# Patient Record
Sex: Female | Born: 1945 | Race: White | Hispanic: No | Marital: Single | Smoking: Former smoker
Health system: Southern US, Community
[De-identification: ages and names within clinical notes are randomized; demographics above are authoritative.]

## PROBLEM LIST (undated history)

## (undated) ENCOUNTER — Encounter

## (undated) ENCOUNTER — Encounter: Payer: MEDICARE | Attending: Ambulatory Care | Primary: Ambulatory Care

## (undated) ENCOUNTER — Ambulatory Visit

## (undated) ENCOUNTER — Encounter: Attending: Rheumatology | Primary: Rheumatology

## (undated) ENCOUNTER — Encounter: Attending: Family | Primary: Family

## (undated) ENCOUNTER — Ambulatory Visit: Payer: MEDICARE

## (undated) ENCOUNTER — Encounter: Payer: MEDICARE | Attending: Pulmonary Disease | Primary: Pulmonary Disease

## (undated) ENCOUNTER — Telehealth

## (undated) ENCOUNTER — Encounter: Payer: MEDICARE | Attending: Rheumatology | Primary: Rheumatology

## (undated) ENCOUNTER — Telehealth: Attending: Ambulatory Care | Primary: Ambulatory Care

## (undated) DIAGNOSIS — K635 Polyp of colon: Secondary | ICD-10-CM

## (undated) DIAGNOSIS — R079 Chest pain, unspecified: Secondary | ICD-10-CM

## (undated) DIAGNOSIS — J449 Chronic obstructive pulmonary disease, unspecified: Secondary | ICD-10-CM

## (undated) DIAGNOSIS — K604 Rectal fistula, unspecified: Secondary | ICD-10-CM

## (undated) DIAGNOSIS — M069 Rheumatoid arthritis, unspecified: Secondary | ICD-10-CM

## (undated) DIAGNOSIS — K219 Gastro-esophageal reflux disease without esophagitis: Secondary | ICD-10-CM

## (undated) DIAGNOSIS — E559 Vitamin D deficiency, unspecified: Secondary | ICD-10-CM

## (undated) DIAGNOSIS — K2270 Barrett's esophagus without dysplasia: Secondary | ICD-10-CM

## (undated) DIAGNOSIS — F418 Other specified anxiety disorders: Secondary | ICD-10-CM

## (undated) DIAGNOSIS — I4891 Unspecified atrial fibrillation: Secondary | ICD-10-CM

## (undated) DIAGNOSIS — R32 Unspecified urinary incontinence: Secondary | ICD-10-CM

## (undated) DIAGNOSIS — Z8669 Personal history of other diseases of the nervous system and sense organs: Secondary | ICD-10-CM

## (undated) DIAGNOSIS — I4821 Permanent atrial fibrillation: Secondary | ICD-10-CM

## (undated) DIAGNOSIS — G629 Polyneuropathy, unspecified: Secondary | ICD-10-CM

## (undated) HISTORY — DX: Barrett's esophagus without dysplasia: K22.70

## (undated) HISTORY — DX: Unspecified urinary incontinence: R32

## (undated) HISTORY — DX: Personal history of other diseases of the nervous system and sense organs: Z86.69

## (undated) HISTORY — DX: Other specified anxiety disorders: F41.8

## (undated) HISTORY — DX: Unspecified atrial fibrillation: I48.91

## (undated) HISTORY — DX: Gastro-esophageal reflux disease without esophagitis: K21.9

## (undated) HISTORY — DX: Rectal fistula: K60.4

## (undated) HISTORY — PX: APPENDECTOMY: SHX54

## (undated) HISTORY — DX: Chest pain, unspecified: R07.9

## (undated) HISTORY — DX: Polyp of colon: K63.5

## (undated) HISTORY — DX: Vitamin D deficiency, unspecified: E55.9

## (undated) HISTORY — DX: Polyneuropathy, unspecified: G62.9

## (undated) HISTORY — DX: Rectal fistula, unspecified: K60.40

## (undated) HISTORY — DX: Rheumatoid arthritis, unspecified: M06.9

## (undated) HISTORY — PX: OOPHORECTOMY: SHX86

## (undated) HISTORY — DX: Chronic obstructive pulmonary disease, unspecified: J44.9

---
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
## 1985-12-30 HISTORY — PX: CHOLECYSTECTOMY: SHX55

## 1990-12-30 HISTORY — PX: OTHER SURGICAL HISTORY: SHX169

## 1990-12-30 HISTORY — PX: ABDOMINAL HYSTERECTOMY: SHX81

## 2005-11-20 ENCOUNTER — Emergency Department: Payer: Self-pay | Admitting: Internal Medicine

## 2005-11-20 ENCOUNTER — Other Ambulatory Visit: Payer: Self-pay

## 2005-11-23 ENCOUNTER — Inpatient Hospital Stay: Payer: Self-pay | Admitting: Internal Medicine

## 2005-11-23 ENCOUNTER — Other Ambulatory Visit: Payer: Self-pay

## 2005-11-25 ENCOUNTER — Other Ambulatory Visit: Payer: Self-pay

## 2005-11-26 ENCOUNTER — Other Ambulatory Visit: Payer: Self-pay

## 2006-01-10 ENCOUNTER — Emergency Department: Payer: Self-pay | Admitting: Emergency Medicine

## 2006-01-10 ENCOUNTER — Other Ambulatory Visit: Payer: Self-pay

## 2006-03-12 ENCOUNTER — Emergency Department: Payer: Self-pay | Admitting: Unknown Physician Specialty

## 2006-03-13 ENCOUNTER — Inpatient Hospital Stay: Payer: Self-pay | Admitting: Internal Medicine

## 2006-03-13 ENCOUNTER — Other Ambulatory Visit: Payer: Self-pay

## 2007-01-05 ENCOUNTER — Emergency Department: Payer: Self-pay

## 2007-02-14 ENCOUNTER — Other Ambulatory Visit: Payer: Self-pay

## 2007-02-14 ENCOUNTER — Emergency Department: Payer: Self-pay | Admitting: Emergency Medicine

## 2007-06-28 ENCOUNTER — Emergency Department: Payer: Self-pay | Admitting: Emergency Medicine

## 2007-08-18 ENCOUNTER — Emergency Department: Payer: Self-pay | Admitting: Emergency Medicine

## 2007-10-14 ENCOUNTER — Ambulatory Visit: Payer: Self-pay | Admitting: Gastroenterology

## 2007-12-04 ENCOUNTER — Other Ambulatory Visit: Payer: Self-pay

## 2007-12-04 ENCOUNTER — Inpatient Hospital Stay: Payer: Self-pay | Admitting: Internal Medicine

## 2008-02-17 ENCOUNTER — Emergency Department: Payer: Self-pay | Admitting: Internal Medicine

## 2008-02-21 ENCOUNTER — Other Ambulatory Visit: Payer: Self-pay

## 2008-02-21 ENCOUNTER — Emergency Department: Payer: Self-pay | Admitting: Internal Medicine

## 2008-04-30 ENCOUNTER — Emergency Department: Payer: Self-pay | Admitting: Emergency Medicine

## 2008-04-30 ENCOUNTER — Other Ambulatory Visit: Payer: Self-pay

## 2008-08-01 ENCOUNTER — Emergency Department: Payer: Self-pay

## 2008-08-23 ENCOUNTER — Emergency Department: Payer: Self-pay | Admitting: Emergency Medicine

## 2009-02-07 ENCOUNTER — Ambulatory Visit: Payer: Self-pay | Admitting: Internal Medicine

## 2009-08-15 ENCOUNTER — Encounter: Payer: Self-pay | Admitting: General Practice

## 2010-09-10 ENCOUNTER — Emergency Department: Payer: Self-pay | Admitting: Emergency Medicine

## 2010-09-20 ENCOUNTER — Emergency Department: Payer: Self-pay | Admitting: Emergency Medicine

## 2011-01-20 ENCOUNTER — Emergency Department: Payer: Self-pay | Admitting: Unknown Physician Specialty

## 2011-11-16 ENCOUNTER — Inpatient Hospital Stay: Payer: Self-pay | Admitting: Psychiatry

## 2011-12-16 DIAGNOSIS — R7303 Prediabetes: Secondary | ICD-10-CM | POA: Insufficient documentation

## 2011-12-29 ENCOUNTER — Emergency Department: Payer: Self-pay | Admitting: *Deleted

## 2011-12-31 HISTORY — PX: CARDIAC ELECTROPHYSIOLOGY STUDY AND ABLATION: SHX1294

## 2012-04-16 ENCOUNTER — Ambulatory Visit: Payer: Self-pay | Admitting: Family Medicine

## 2012-04-28 ENCOUNTER — Ambulatory Visit: Payer: Self-pay | Admitting: Family Medicine

## 2012-11-23 DIAGNOSIS — Z800 Family history of malignant neoplasm of digestive organs: Secondary | ICD-10-CM | POA: Insufficient documentation

## 2012-11-23 DIAGNOSIS — D126 Benign neoplasm of colon, unspecified: Secondary | ICD-10-CM | POA: Insufficient documentation

## 2012-12-14 DIAGNOSIS — L821 Other seborrheic keratosis: Secondary | ICD-10-CM | POA: Insufficient documentation

## 2013-05-31 DIAGNOSIS — M069 Rheumatoid arthritis, unspecified: Secondary | ICD-10-CM | POA: Insufficient documentation

## 2013-08-18 ENCOUNTER — Encounter: Payer: Self-pay | Admitting: Adult Health

## 2013-08-18 ENCOUNTER — Ambulatory Visit (INDEPENDENT_AMBULATORY_CARE_PROVIDER_SITE_OTHER): Payer: Medicare Other | Admitting: Adult Health

## 2013-08-18 VITALS — BP 116/70 | HR 74 | Temp 98.4°F | Resp 12 | Ht 66.0 in | Wt 214.0 lb

## 2013-08-18 DIAGNOSIS — K603 Anal fistula: Secondary | ICD-10-CM

## 2013-08-18 DIAGNOSIS — K529 Noninfective gastroenteritis and colitis, unspecified: Secondary | ICD-10-CM | POA: Insufficient documentation

## 2013-08-18 DIAGNOSIS — R0602 Shortness of breath: Secondary | ICD-10-CM

## 2013-08-18 DIAGNOSIS — R5381 Other malaise: Secondary | ICD-10-CM

## 2013-08-18 DIAGNOSIS — R209 Unspecified disturbances of skin sensation: Secondary | ICD-10-CM

## 2013-08-18 DIAGNOSIS — R197 Diarrhea, unspecified: Secondary | ICD-10-CM

## 2013-08-18 DIAGNOSIS — R531 Weakness: Secondary | ICD-10-CM | POA: Insufficient documentation

## 2013-08-18 DIAGNOSIS — Z0000 Encounter for general adult medical examination without abnormal findings: Secondary | ICD-10-CM

## 2013-08-18 DIAGNOSIS — K604 Rectal fistula: Secondary | ICD-10-CM

## 2013-08-18 DIAGNOSIS — R5383 Other fatigue: Secondary | ICD-10-CM

## 2013-08-18 DIAGNOSIS — K2270 Barrett's esophagus without dysplasia: Secondary | ICD-10-CM

## 2013-08-18 DIAGNOSIS — Z1239 Encounter for other screening for malignant neoplasm of breast: Secondary | ICD-10-CM

## 2013-08-18 DIAGNOSIS — R200 Anesthesia of skin: Secondary | ICD-10-CM | POA: Insufficient documentation

## 2013-08-18 LAB — POCT URINALYSIS DIPSTICK
Bilirubin, UA: NEGATIVE
Blood, UA: NEGATIVE
Glucose, UA: NEGATIVE
Nitrite, UA: NEGATIVE
Spec Grav, UA: 1.02
Urobilinogen, UA: 1
pH, UA: 6

## 2013-08-18 NOTE — Assessment & Plan Note (Addendum)
Occuring after heavy meals. Unknown etiology. She is taking [place]. Refer to GI

## 2013-08-18 NOTE — Patient Instructions (Addendum)
   Thank you for choosing [place] [facility] for your health care needs.  Please have your labs drawn prior to leaving the office.  The results will be available through MyChart for your convenience. Please remember to activate this. The activation code is located at the end of this form.  I am referring you to GI and Cardiology as discussed.  Remember to call to schedule your Mammography appointment.  Return for follow up in 3 months.

## 2013-08-18 NOTE — Assessment & Plan Note (Signed)
Physical exam normal except problems listed separately. Check labs: CBC with differential, bmet, TSH, lipids, hepatic panel. I am referring her to Dr. Smith Mince (GI) for her Barrett's esophagus, chronic diarrhea and rectal fistula. I am referring her to Dr. Kirke Corin for her history of A. fib and recent complaints of shortness of breath with exertion. She would like to establish care with a local cardiologist. Patient has appointment with her rheumatologist, Dr. Wallace Cullens, on September 15. I am having her return for followup in 3 months or sooner if needed.

## 2013-08-18 NOTE — Assessment & Plan Note (Signed)
Neuropathy. Check B12, metabolic panel, tsh. Consider nerve conduction test

## 2013-08-18 NOTE — Assessment & Plan Note (Addendum)
Refer to GI 

## 2013-08-18 NOTE — Assessment & Plan Note (Signed)
Recent EGD. Request medical records from Dr. Pandora Leiter in [place]. Refer to local GI.

## 2013-08-18 NOTE — Progress Notes (Signed)
Subjective:    Patient ID: Brandy Armstrong, female    DOB: [birth year], 67 y.o.   MRN: [id number]  HPI  Patient is a pleasant 67 year old female who presents to clinic to establish care. Patient has been under the care of multiple specialist (GI, Rheumatology, Cardiology, Psychiatry) but has not had a PCP. She reports going to urgent care or to the [facility] occasionally if experiencing problems. She became eligible for Medicare on August 1 and decided it was time to get a primary provider. Her greatest concern at this time is that she has gained a considerable amount of weight; however, she reports that she has not been active. She also reports sleeping a lot. Another concern is that she is becoming short of breath with exertion. She attributes this to poor endurance from lack of activity. No shortness of breath at rest. Patient has been experiencing chronic diarrhea after heavy meals. She has been prescribed questran. She was to have a follow up visit with her GI doctor in [place] but did not keep appointment. She is trying to find local physicians.   Past Medical History  Diagnosis Date  . Rheumatoid arthritis     On methotrexate and orencia  . GERD (gastroesophageal reflux disease)   . Atrial fibrillation     s/p ablation 01/2012 in [facility] by Dr. Dory Peru  . Urinary incontinence   . Colon polyps   . Depression with anxiety   . Barretts esophagus   . Rectal fistula   . Vitamin D deficiency     Past Surgical History  Procedure Laterality Date  . Cholecystectomy  1987  . Abdominal hysterectomy  1992  . Oophrectomy Bilateral 1992  . Cardiac electrophysiology study and ablation  2013    Family History  Problem Relation Age of Onset  . Depression Mother   . Cancer Mother 9    pancreatic cancer  . Cancer Father 77    colon cancer  . Cancer Sister 30    breast cancer  . Diabetes Brother     History   Social History  . Marital Status: Single    Spouse Name: N/A   Number of Children: 0  . Years of Education: 16   Occupational History  . Not on file.   Social History Main Topics  . Smoking status: Former Smoker -- 1.00 packs/day for 40 years    Quit date: 12/16/2011  . Smokeless tobacco: Never Used  . Alcohol Use: Yes     Comment: Occasionally has a drink  . Drug Use: No  . Sexual Activity: Yes    Birth Control/ Protection: Surgical   Other Topics Concern  . Not on file   Social History Narrative   Brandy Armstrong was born and reared in [place]. She attended [employer] and graduated in 1971 with her Bachelors in Education. 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 She is single. She is currently retired. She loves reading. She loves to spend time with her dog.     Review of Systems  Constitutional: Positive for fatigue.  Respiratory: Positive for shortness of breath.        Sob with exertion. Patient believes it is from deconditioning.  Cardiovascular: Positive for leg swelling.  Gastrointestinal: Positive for diarrhea.       Diarrhea after meals. She is followed by MD in [facility]. Rectal fistula.  Endocrine: Negative.   Genitourinary: Positive for dysuria, urgency and frequency. Negative for hematuria and vaginal bleeding.       Nocturnal incontinence. Reports odor of urine.  Musculoskeletal: Positive for back pain, joint swelling and arthralgias.       Hx of rheumatoid arthritis.  Neurological: Positive for tremors and light-headedness.       Pins and needles in feet.  Psychiatric/Behavioral: Negative for suicidal ideas, behavioral problems, confusion, sleep disturbance, self-injury and agitation. The patient is nervous/anxious.     BP 116/70  Pulse 74  Temp(Src) 98.4 F (36.9 C) (Oral)  Resp 12  Ht 5\' 6"  (1.676 m)  Wt 214 lb (97.07 kg)  BMI 34.56 kg/m2  SpO2 97%    Objective:   Physical Exam  Constitutional: She is  oriented to person, place, and time.  Overweight, pleasant female in no apparent distress  HENT:  Head: Normocephalic and atraumatic.  Right Ear: External ear normal.  Left Ear: External ear normal.  Nose: Nose normal.  Mouth/Throat: Oropharynx is clear and moist.  Eyes: Conjunctivae and EOM are normal. Pupils are equal, round, and reactive to light.  Wears glasses  Neck: Normal range of motion. Neck supple. No tracheal deviation present. No thyromegaly present.  Cardiovascular: Normal rate, regular rhythm, normal heart sounds and intact distal pulses.  Exam reveals no gallop and no friction rub.   No murmur heard. Pulmonary/Chest: Effort normal and breath sounds normal. No respiratory distress. She has no wheezes. She has no rales. She exhibits no tenderness.  Abdominal: Soft. Bowel sounds are normal. She exhibits no mass. There is no tenderness. There is no rebound and no guarding.  Large, rounded, soft abdomen  Musculoskeletal: She exhibits edema and tenderness.  Painful joints especially bilateral knees. Some pain in lower back. Patient has appointment with her rheumatologist September 15  Lymphadenopathy:    She has no cervical adenopathy.  Neurological: She is alert and oriented to person, place, and time. She has normal reflexes. No cranial nerve deficit. Coordination normal.  Skin: Skin is warm and dry.  Psychiatric: She has a normal mood and affect. Her behavior is normal. Judgment and thought content normal.      Assessment & Plan:

## 2013-08-18 NOTE — Assessment & Plan Note (Signed)
Patient has been sleeping more than usual. Chronic problems with diarrhea - ?electrolyte abnormality vs. Dehydration. Will check bmet and also check tsh and cbc.

## 2013-08-18 NOTE — Assessment & Plan Note (Signed)
Screening mammography at [facility] imaging.

## 2013-08-18 NOTE — Assessment & Plan Note (Signed)
Patient with history of A. fib status post ablation. Patient has been extremely sedentary and symptoms may be attributed to decreased stamina. Check CBC, bmet, tsh. Refer to cardiology. She has seen Dr. Kirke Corin in the past.

## 2013-08-19 ENCOUNTER — Other Ambulatory Visit: Payer: Self-pay | Admitting: Adult Health

## 2013-08-19 DIAGNOSIS — D649 Anemia, unspecified: Secondary | ICD-10-CM

## 2013-08-19 LAB — CBC WITH DIFFERENTIAL/PLATELET
Basophils Absolute: 0.2 10*3/uL — ABNORMAL HIGH (ref 0.0–0.1)
Eosinophils Absolute: 0 10*3/uL (ref 0.0–0.7)
HCT: 29.6 % — ABNORMAL LOW (ref 36.0–46.0)
Hemoglobin: 9.5 g/dL — ABNORMAL LOW (ref 12.0–15.0)
Lymphs Abs: 1.8 10*3/uL (ref 0.7–4.0)
MCHC: 32 g/dL (ref 30.0–36.0)
MCV: 74.9 fl — ABNORMAL LOW (ref 78.0–100.0)
Monocytes Absolute: 0.2 10*3/uL (ref 0.1–1.0)
Neutro Abs: 5.6 10*3/uL (ref 1.4–7.7)
Platelets: 243 10*3/uL (ref 150.0–400.0)
RDW: 17.3 % — ABNORMAL HIGH (ref 11.5–14.6)

## 2013-08-19 LAB — BASIC METABOLIC PANEL
BUN: 14 mg/dL (ref 6–23)
CO2: 26 mEq/L (ref 19–32)
Calcium: 9 mg/dL (ref 8.4–10.5)
GFR: 83.71 mL/min (ref >60.00)
Glucose, Bld: 104 mg/dL — ABNORMAL HIGH (ref 70–99)
Sodium: 133 mEq/L — ABNORMAL LOW (ref 135–145)

## 2013-08-19 LAB — HEPATIC FUNCTION PANEL
ALT: 20 U/L (ref 0–35)
AST: 43 U/L — ABNORMAL HIGH (ref 0–37)
Bilirubin, Direct: 0.1 mg/dL (ref 0.0–0.3)
Total Bilirubin: 0.4 mg/dL (ref 0.3–1.2)

## 2013-08-19 LAB — TSH: TSH: 0.87 u[IU]/mL (ref 0.35–5.50)

## 2013-08-19 LAB — VITAMIN B12: Vitamin B-12: 281 pg/mL (ref 211–911)

## 2013-08-19 LAB — LIPID PANEL: Cholesterol: 160 mg/dL (ref 0–200)

## 2013-08-20 ENCOUNTER — Other Ambulatory Visit (INDEPENDENT_AMBULATORY_CARE_PROVIDER_SITE_OTHER): Payer: Medicare Other

## 2013-08-20 DIAGNOSIS — D649 Anemia, unspecified: Secondary | ICD-10-CM

## 2013-08-20 LAB — CBC WITH DIFFERENTIAL/PLATELET
Basophils Relative: 0.6 % (ref 0.0–3.0)
Eosinophils Relative: 2.9 % (ref 0.0–5.0)
HCT: 29.7 % — ABNORMAL LOW (ref 36.0–46.0)
Hemoglobin: 9.5 g/dL — ABNORMAL LOW (ref 12.0–15.0)
MCHC: 32.1 g/dL (ref 30.0–36.0)
MCV: 75.2 fl — ABNORMAL LOW (ref 78.0–100.0)
Monocytes Absolute: 0.3 10*3/uL (ref 0.1–1.0)
Neutro Abs: 3.6 10*3/uL (ref 1.4–7.7)
Neutrophils Relative %: 54.7 % (ref 43.0–77.0)
RBC: 3.95 Mil/uL (ref 3.87–5.11)
WBC: 6.6 10*3/uL (ref 4.5–10.5)

## 2013-08-20 LAB — FERRITIN: Ferritin: 12.1 ng/mL (ref 10.0–291.0)

## 2013-08-24 ENCOUNTER — Other Ambulatory Visit (INDEPENDENT_AMBULATORY_CARE_PROVIDER_SITE_OTHER): Payer: Medicare Other

## 2013-08-24 DIAGNOSIS — D649 Anemia, unspecified: Secondary | ICD-10-CM

## 2013-08-31 ENCOUNTER — Emergency Department: Payer: Self-pay | Admitting: Emergency Medicine

## 2013-08-31 ENCOUNTER — Telehealth: Payer: Self-pay | Admitting: Adult Health

## 2013-08-31 LAB — BASIC METABOLIC PANEL
BUN: 9 mg/dL (ref 7–18)
Creatinine: 0.84 mg/dL (ref 0.60–1.30)
EGFR (African American): >60
EGFR (Non-African Amer.): >60
Glucose: 110 mg/dL — ABNORMAL HIGH (ref 65–99)
Osmolality: 273 (ref 275–301)

## 2013-08-31 LAB — CBC WITH DIFFERENTIAL/PLATELET
Eosinophil #: 0.2 10*3/uL (ref 0.0–0.7)
Eosinophil %: 1.8 %
HGB: 10.5 g/dL — ABNORMAL LOW (ref 12.0–16.0)
MCH: 24.3 pg — ABNORMAL LOW (ref 26.0–34.0)
Monocyte #: 0.6 x10 3/mm (ref 0.2–0.9)
Monocyte %: 6.8 %
Neutrophil %: 59.3 %
Platelet: 213 10*3/uL (ref 150–440)
RDW: 18.7 % — ABNORMAL HIGH (ref 11.5–14.5)
WBC: 9.3 10*3/uL (ref 3.6–11.0)

## 2013-08-31 NOTE — Telephone Encounter (Signed)
FYI Left message on answering machine to confirm that pt went to ED for evaluation and to call with update tomorrow.

## 2013-08-31 NOTE — Telephone Encounter (Signed)
Patient Information:  Caller Name: Sherryn  Phone: [phone]  Patient: Brandy Armstrong, Brandy Armstrong  Gender: Female  DOB: 1946/01/26  Age: 67 Years  PCP: Orville Govern  Office Follow Up:  Does the office need to follow up with this patient?: No  Instructions For The Office: N/A   Symptoms  Reason For Call & Symptoms: Pt's left ankle is having stabbing pain in her left ankle with walking.  SX started 09/01/2013.  Her hemaglobin is low  and she is scheduled for an endoscopy 09/02/13  She took  some old Prednisone 5 mg  x 2 tabs.  She wants more Prednisone called in and instructed an appt needs to be made.  Pain is on the medial side of left ankle with walking.  Reviewed Health History In EMR: Yes  Reviewed Medications In EMR: Yes  Reviewed Allergies In EMR: Yes  Reviewed Surgeries / Procedures: N/A  Date of Onset of Symptoms: Unknown  Treatments Tried: Predniosone 5 mg x 2 (took 2 old pills)  Treatments Tried Worked: No  Guideline(s) Used:  Ankle Pain  Disposition Per Guideline:   Go to Office Now  No appts left so pt will go to the ED, [facility]  Reason For Disposition Reached:   Severe pain (e.g., excruciating, unable to walk) and not improved after 2 hours of pain medicine  Advice Given:  Be very careful walking.   Patient Will Follow Care Advice:  YES

## 2013-09-02 ENCOUNTER — Telehealth: Payer: Self-pay | Admitting: Adult Health

## 2013-09-02 ENCOUNTER — Ambulatory Visit: Payer: Self-pay | Admitting: Gastroenterology

## 2013-09-02 NOTE — Telephone Encounter (Signed)
[phone] alternate cell# for pt.  Asking if Raquel could give her a call.  Pt would like to speak with Raquel regarding a recent endoscopy.  Pt not satisfied with Dr. Servando Snare and states she still has no answers to what her problem may be.  Pt would prefer to go to [facility] if possible.

## 2013-09-03 NOTE — Telephone Encounter (Signed)
Please call patient and find out information on what she needs. We can refer her to [facility] if she prefers.

## 2013-09-03 NOTE — Telephone Encounter (Signed)
Left message on machine for patient returning her call 

## 2013-09-03 NOTE — Telephone Encounter (Signed)
Spoke with patient and she has an appointment at [facility] 10/07/13.

## 2013-09-06 LAB — PATHOLOGY REPORT

## 2013-09-09 ENCOUNTER — Ambulatory Visit: Payer: Medicare Other | Admitting: Cardiovascular Disease

## 2013-09-11 DIAGNOSIS — M1990 Unspecified osteoarthritis, unspecified site: Secondary | ICD-10-CM | POA: Insufficient documentation

## 2013-09-11 DIAGNOSIS — IMO0002 Reserved for concepts with insufficient information to code with codable children: Secondary | ICD-10-CM | POA: Insufficient documentation

## 2013-09-13 DIAGNOSIS — Z23 Encounter for immunization: Secondary | ICD-10-CM | POA: Insufficient documentation

## 2013-09-16 ENCOUNTER — Ambulatory Visit: Payer: Medicare Other | Admitting: Cardiovascular Disease

## 2013-09-16 DIAGNOSIS — R42 Dizziness and giddiness: Secondary | ICD-10-CM | POA: Insufficient documentation

## 2013-09-17 ENCOUNTER — Telehealth: Payer: Self-pay | Admitting: Adult Health

## 2013-09-17 ENCOUNTER — Encounter: Payer: Self-pay | Admitting: Adult Health

## 2013-09-17 ENCOUNTER — Ambulatory Visit (INDEPENDENT_AMBULATORY_CARE_PROVIDER_SITE_OTHER): Payer: Medicare Other | Admitting: Adult Health

## 2013-09-17 VITALS — BP 108/66 | HR 81 | Temp 98.1°F | Resp 12 | Ht 66.0 in | Wt 213.5 lb

## 2013-09-17 DIAGNOSIS — M25572 Pain in left ankle and joints of left foot: Secondary | ICD-10-CM | POA: Insufficient documentation

## 2013-09-17 DIAGNOSIS — M25579 Pain in unspecified ankle and joints of unspecified foot: Secondary | ICD-10-CM

## 2013-09-17 NOTE — Telephone Encounter (Signed)
States she went to ER yesterday regarding L ankle pain.  States she has rheumatoid arthritis.  States at ER visit she was told it is degenerative arthritis.  States it is causing excruciating pain and will nearly make her fall when she takes a step.  Pt did not want to schedule appt.  Pt asking for a call.  May leave msg.

## 2013-09-17 NOTE — Patient Instructions (Addendum)
  Continue with the tramadol for your ankle pain.  Ice alternating with heat for 15 minutes at a time. Do this 3-4 times a day. You will do this for 1 week.  Rest and elevate your foot as much as possible.  Use comfortable walking shoes. You can try an ankle support to see if this helps.

## 2013-09-17 NOTE — Telephone Encounter (Signed)
The patient returned you call she was seen at [facility]. She did not want to be seen today she is on Monday's schedule with Raquel.

## 2013-09-17 NOTE — Assessment & Plan Note (Signed)
Continue with the tramadol for your ankle pain. Ice alternating with heat for 15 minutes at a time. Do this 3-4 times a day. You will do this for 1 week. Rest and elevate foot as much as possible. Use comfortable walking shoes. You can try an ankle support to see if this helps. If no improvement will refer to podiatry.

## 2013-09-17 NOTE — Progress Notes (Signed)
  Subjective:    Patient ID: Brandy Armstrong, female    DOB: Oct 13, 1946, 67 y.o.   MRN: [id number]  HPI  Patient is a 67 year old female who presents to clinic following an ED visit on 08/31/2013 for pain in the left ankle with ambulation. I have obtained the records from the emergency room visit. The x-ray shows no evidence of fracture, dislocation or foreign body. She does have a bony spur on the plantar region of her heel. Patient denies any swelling. She denies having any pain with palpation of the ankle. This pain started on the date of her emergency room visit. She also denies any injury, or rolling of her ankle. In the emergency room she she was given tramadol and she has taken this a few times. She has not been consistent with the medication.  Current Outpatient Prescriptions on File Prior to Visit  Medication Sig Dispense Refill  . cholestyramine (QUESTRAN) 4 G packet Take 1 packet by mouth as needed.      . clonazePAM (KLONOPIN) 0.5 MG tablet Take 0.5 mg by mouth 3 (three) times daily as needed.       . ergocalciferol (VITAMIN D2) 50000 UNITS capsule Take 50,000 Units by mouth once a week.      . escitalopram (LEXAPRO) 20 MG tablet Take 20 mg by mouth daily.       . folic acid (FOLVITE) 1 MG tablet Take 1 mg by mouth daily.      . Methotrexate, Anti-Rheumatic, (METHOTREXATE, PF, [place]) Inject into the skin. 50 mg/18mL  0.76mL subcutaneous injection weekly      . omeprazole (PRILOSEC) 40 MG capsule Take 40 mg by mouth daily.       [name] ORENCIA 125 MG/ML SOLN Inject 1 each into the skin once a week.       . predniSONE (DELTASONE) 5 MG tablet Take 10 mg by mouth daily.       . Rivaroxaban (XARELTO) 20 MG TABS tablet Take 20 mg by mouth daily with supper.      . sotalol (BETAPACE) 120 MG tablet Take 120 mg by mouth 2 (two) times daily.        No current facility-administered medications on file prior to visit.    Review of Systems  Musculoskeletal:       Pain in left ankle with ambulation.      BP 108/66  Pulse 81  Temp(Src) 98.1 F (36.7 C) (Oral)  Resp 12  Ht 5\' 6"  (1.676 m)  Wt 213 lb 8 oz (96.843 kg)  BMI 34.48 kg/m2  SpO2 98%     Objective:   Physical Exam  Constitutional: She is oriented to person, place, and time. No distress.  Musculoskeletal: Normal range of motion. She exhibits no edema and no tenderness.  Left medial malleolus without any point tenderness, swelling or any s/s injury. Excellent pulses.  Neurological: She is alert and oriented to person, place, and time.  Psychiatric: She has a normal mood and affect. Her behavior is normal. Judgment and thought content normal.          Assessment & Plan:

## 2013-09-17 NOTE — Telephone Encounter (Signed)
Requested records from [facility] 

## 2013-09-17 NOTE — Telephone Encounter (Signed)
Called and left message, advising pt to schedule an OV with Raquel today and to leave name of ED she went to so I may get records.

## 2013-09-20 ENCOUNTER — Ambulatory Visit: Payer: Medicare Other | Admitting: Adult Health

## 2013-09-30 ENCOUNTER — Ambulatory Visit: Payer: Medicare Other | Admitting: Cardiovascular Disease

## 2013-11-04 ENCOUNTER — Other Ambulatory Visit: Payer: Self-pay

## 2013-11-04 ENCOUNTER — Ambulatory Visit: Payer: Medicare Other | Admitting: Cardiovascular Disease

## 2013-11-09 DIAGNOSIS — M778 Other enthesopathies, not elsewhere classified: Secondary | ICD-10-CM | POA: Insufficient documentation

## 2013-11-09 DIAGNOSIS — M779 Enthesopathy, unspecified: Secondary | ICD-10-CM

## 2013-11-18 ENCOUNTER — Ambulatory Visit: Payer: Medicare Other | Admitting: Adult Health

## 2013-11-22 ENCOUNTER — Encounter: Payer: Self-pay | Admitting: *Deleted

## 2013-11-29 ENCOUNTER — Encounter: Payer: Self-pay | Admitting: *Deleted

## 2013-12-13 ENCOUNTER — Ambulatory Visit: Payer: Medicare Other | Admitting: Cardiovascular Disease

## 2014-01-04 ENCOUNTER — Telehealth: Payer: Self-pay | Admitting: Adult Health

## 2014-01-04 NOTE — Telephone Encounter (Signed)
Spoke with pt, states not loose stools, formed stools but having them frequently. Tolerating ginger ale and tea. No appetite. States symptoms started last night. Offered appointment right now to see Raquel, pt declines. Offered appointment tomorrow, pt took a 10:15 appointment tomorrow.

## 2014-01-04 NOTE — Telephone Encounter (Signed)
See other telephone note encounter.

## 2014-01-04 NOTE — Telephone Encounter (Signed)
Please advise 

## 2014-01-04 NOTE — Telephone Encounter (Signed)
Attempted to return call, but no answer; msg said line was having technical difficulties and was unable to leave a msg on VM

## 2014-01-04 NOTE — Telephone Encounter (Signed)
Ask if she can come now

## 2014-01-04 NOTE — Telephone Encounter (Signed)
Patient Information:  Caller Name: Brandy Armstrong  Phone: [phone]  Patient: Brandy Armstrong, Brandy Armstrong  Gender: Female  DOB: [birth year]  Age: 68 Years  PCP: Charolette Forward  Office Follow Up:  Does the office need to follow up with this patient?: Yes  Instructions For The Office: Has go to office now disposition due to age >23 and has had > 6 stools in past 24 hours   Symptoms  Reason For Call & Symptoms: [name] states she had onset of vomiting and diarrhea at 0200. Has vomited x 2 . Diarrhea too many to count. Intermittent abdominal cramping briefly relieved by diarrhea. Has felt feverish with chills on 01/04/13. Has not checked temp. Has taken Tylenol. Nauseated. Feels weak. Per diarrhea protocol has  go to office now due to age > 70 and more than 6 diarrhea stools. No appts left in [facility]. Declined appt at another [place] location. Please call Arieliz at [phone].  Reviewed Health History In EMR: Yes  Reviewed Medications In EMR: Yes  Reviewed Allergies In EMR: Yes  Reviewed Surgeries / Procedures: Yes  Date of Onset of Symptoms: 01/04/2014  Treatments Tried: Immodium x 2  Treatments Tried Worked: No  Guideline(s) Used:  Diarrhea  Vomiting  Disposition Per Guideline:   Go to Office Now  Reason For Disposition Reached:   Age > 60 years and has had > 6 diarrhea stools in past 24 hours  Advice Given:  N/A  Patient Will Follow Care Advice:  YES

## 2014-01-05 ENCOUNTER — Ambulatory Visit: Payer: Medicare Other | Admitting: Adult Health

## 2014-02-28 ENCOUNTER — Telehealth: Payer: Self-pay | Admitting: Adult Health

## 2014-02-28 ENCOUNTER — Emergency Department: Payer: Self-pay | Admitting: Emergency Medicine

## 2014-02-28 LAB — CBC
HCT: 39 % (ref 35.0–47.0)
HGB: 12.4 g/dL (ref 12.0–16.0)
MCH: 25 pg — ABNORMAL LOW (ref 26.0–34.0)
MCHC: 31.9 g/dL — AB (ref 32.0–36.0)
MCV: 78 fL — ABNORMAL LOW (ref 80–100)
Platelet: 209 10*3/uL (ref 150–440)
RBC: 4.98 10*6/uL (ref 3.80–5.20)
RDW: 16.3 % — ABNORMAL HIGH (ref 11.5–14.5)
WBC: 8.1 10*3/uL (ref 3.6–11.0)

## 2014-02-28 LAB — TROPONIN I
Troponin-I: <0.02 ng/mL
Troponin-I: <0.02 ng/mL

## 2014-02-28 LAB — LIPASE, BLOOD: Lipase: 219 U/L (ref 73–393)

## 2014-02-28 LAB — BASIC METABOLIC PANEL
Anion Gap: 7 (ref 7–16)
BUN: 14 mg/dL (ref 7–18)
CALCIUM: 8.7 mg/dL (ref 8.5–10.1)
CHLORIDE: 103 mmol/L (ref 98–107)
CO2: 24 mmol/L (ref 21–32)
Creatinine: 0.89 mg/dL (ref 0.60–1.30)
EGFR (African American): >60
EGFR (Non-African Amer.): >60
GLUCOSE: 126 mg/dL — AB (ref 65–99)
Osmolality: 270 (ref 275–301)
POTASSIUM: 4.1 mmol/L (ref 3.5–5.1)
SODIUM: 134 mmol/L — AB (ref 136–145)

## 2014-02-28 NOTE — Telephone Encounter (Signed)
FYI

## 2014-02-28 NOTE — Telephone Encounter (Signed)
Patient Information:  Caller Name: Sayre  Phone: [phone]  Patient: Brandy Armstrong, Brandy Armstrong  Gender: Female  DOB: 1946-04-16  Age: 67 Years  PCP: Charolette Forward  Office Follow Up:  Does the office need to follow up with this patient?: No  Instructions For The Office: N/A   Symptoms  Reason For Call & Symptoms: Pt had called the office for an appt. There are none today or tomorrow so she was put through to the nurse. Pt has pain in her left  shoulder that goes up her neck and across her shoulders at the back. There was no known injury. Pt has rheumatoid arthritis and takes Prednisone but states this does not feel like that pain. It started 3 days ago. Pt states it is a 9 out of a 10 pain/unbearable. She has also had some SOB.  Reviewed Health History In EMR: Yes  Reviewed Medications In EMR: Yes  Reviewed Allergies In EMR: Yes  Reviewed Surgeries / Procedures: Yes  Date of Onset of Symptoms: 02/25/2014  Guideline(s) Used:  Arm Pain  Disposition Per Guideline:   Go to ED Now/no appts at office /RN advised pt go to [facility].  Reason For Disposition Reached:   Age > 38 and no obvious cause for pain, pain still present even when not moving the arm  Advice Given:  N/A  Patient Will Follow Care Advice:  YES

## 2014-03-02 ENCOUNTER — Ambulatory Visit (INDEPENDENT_AMBULATORY_CARE_PROVIDER_SITE_OTHER): Payer: Medicare Other | Admitting: Adult Health

## 2014-03-02 ENCOUNTER — Encounter: Payer: Self-pay | Admitting: Adult Health

## 2014-03-02 VITALS — BP 99/69 | HR 82 | Temp 97.8°F | Resp 14 | Wt 211.0 lb

## 2014-03-02 DIAGNOSIS — M542 Cervicalgia: Secondary | ICD-10-CM

## 2014-03-02 MED ORDER — HYDROCODONE-ACETAMINOPHEN 5-325 MG PO TABS: 1.0000 | ORAL_TABLET | Freq: Four times a day (QID) | ORAL | Status: DC | PRN

## 2014-03-02 MED ORDER — CYCLOBENZAPRINE HCL 5 MG PO TABS: 5.0000 mg | ORAL_TABLET | Freq: Three times a day (TID) | ORAL | Status: DC | PRN

## 2014-03-02 NOTE — Progress Notes (Signed)
Pre visit review using our clinic review tool, if applicable. No additional management support is needed unless otherwise documented below in the visit note. 

## 2014-03-02 NOTE — Progress Notes (Signed)
   Subjective:    Patient ID: Brandy Armstrong, female    DOB: January 24, 1946, 68 y.o.   MRN: [id number]  Arm Pain  Pertinent negatives include no chest pain.  Neck Pain  Pertinent negatives include no chest pain, headaches or weakness.  Shoulder Pain     Pt is a 68 y/o female who presents to clinic with left side neck pain and posterior shoulder. She reports going to the ED with her symptoms on 02/28/14. She reports they evaluated her for her heart without any finding. She states that they gave her valium in the ED but did not seem to help her symptoms. Pain is keeping her up at night. Pain started approximate 1 week ago. No chest pain, no syncope or sob. No fever or neck stiffness. No nausea or vomiting. Denies injury to the area. No falls reported.   Current Outpatient Prescriptions on File Prior to Visit  Medication Sig Dispense Refill  . acetaminophen (TYLENOL) 500 MG tablet Take 500 mg by mouth every 6 (six) hours as needed for pain.      . cholestyramine (QUESTRAN) 4 G packet Take 1 packet by mouth as needed.      . clonazePAM (KLONOPIN) 0.5 MG tablet Take 0.5 mg by mouth 3 (three) times daily as needed.       . ergocalciferol (VITAMIN D2) 50000 UNITS capsule Take 50,000 Units by mouth once a week.      . escitalopram (LEXAPRO) 20 MG tablet Take 20 mg by mouth daily.       . ferrous sulfate 325 (65 FE) MG tablet Take 325 mg by mouth daily with breakfast.      . folic acid (FOLVITE) 1 MG tablet Take 1 mg by mouth daily.      [name] ORENCIA 125 MG/ML SOLN Inject 1 each into the skin once a week.       . predniSONE (DELTASONE) 5 MG tablet Take 10 mg by mouth daily.       . Rivaroxaban (XARELTO) 20 MG TABS tablet Take 20 mg by mouth daily with supper.      . sotalol (BETAPACE) 120 MG tablet Take 120 mg by mouth 2 (two) times daily.       . traMADol (ULTRAM) 50 MG tablet Take 50 mg by mouth every 6 (six) hours as needed for pain.       No current facility-administered medications on file prior to  visit.      Review of Systems  Constitutional: Negative.   Respiratory: Negative.   Cardiovascular: Negative for chest pain, palpitations and leg swelling.  Musculoskeletal: Positive for neck pain. Negative for neck stiffness.  Neurological: Negative for dizziness, syncope, weakness, light-headedness and headaches.  Psychiatric/Behavioral: Negative.   All other systems reviewed and are negative.       Objective:   Physical Exam  Neck: Normal range of motion. Neck supple. Muscular tenderness present. No spinous process tenderness present. No rigidity. No edema and no erythema present.  Musculoskeletal:       Left shoulder: Normal. She exhibits normal range of motion, no tenderness, no bony tenderness, no swelling, no effusion and no crepitus.  Skin:             Assessment & Plan:    1. Neck pain on left side Suspect muscular origin involving trapezius. Ice, good neck support, flexeril for spasms. Will give her short course of Norco for pain especially at bedtime. If no improvement will refer to ortho.

## 2014-03-11 ENCOUNTER — Inpatient Hospital Stay: Payer: Self-pay | Admitting: Internal Medicine

## 2014-03-11 ENCOUNTER — Encounter: Payer: Self-pay | Admitting: Cardiovascular Disease

## 2014-03-11 ENCOUNTER — Ambulatory Visit (INDEPENDENT_AMBULATORY_CARE_PROVIDER_SITE_OTHER): Payer: Medicare Other | Admitting: Cardiovascular Disease

## 2014-03-11 VITALS — BP 106/80 | HR 106 | Ht 66.5 in | Wt 209.8 lb

## 2014-03-11 DIAGNOSIS — R0602 Shortness of breath: Secondary | ICD-10-CM

## 2014-03-11 DIAGNOSIS — J449 Chronic obstructive pulmonary disease, unspecified: Secondary | ICD-10-CM | POA: Insufficient documentation

## 2014-03-11 DIAGNOSIS — I4891 Unspecified atrial fibrillation: Secondary | ICD-10-CM

## 2014-03-11 LAB — BASIC METABOLIC PANEL
ANION GAP: 8 (ref 7–16)
BUN: 13 mg/dL (ref 7–18)
CO2: 22 mmol/L (ref 21–32)
Calcium, Total: 8.5 mg/dL (ref 8.5–10.1)
Chloride: 106 mmol/L (ref 98–107)
Creatinine: 0.89 mg/dL (ref 0.60–1.30)
Glucose: 89 mg/dL (ref 65–99)
OSMOLALITY: 272 (ref 275–301)
Potassium: 4 mmol/L (ref 3.5–5.1)
Sodium: 136 mmol/L (ref 136–145)

## 2014-03-11 LAB — CBC
HCT: 39.7 % (ref 35.0–47.0)
HGB: 12.5 g/dL (ref 12.0–16.0)
MCH: 24.5 pg — ABNORMAL LOW (ref 26.0–34.0)
MCHC: 31.4 g/dL — ABNORMAL LOW (ref 32.0–36.0)
MCV: 78 fL — AB (ref 80–100)
Platelet: 195 10*3/uL (ref 150–440)
RBC: 5.09 10*6/uL (ref 3.80–5.20)
RDW: 16.6 % — AB (ref 11.5–14.5)
WBC: 11.3 10*3/uL — AB (ref 3.6–11.0)

## 2014-03-11 LAB — TSH: THYROID STIMULATING HORM: 1.99 u[IU]/mL

## 2014-03-11 LAB — TROPONIN I: Troponin-I: <0.02 ng/mL

## 2014-03-11 NOTE — Assessment & Plan Note (Signed)
She is in atrial fibrillation with rapid ventricular response with mild hypotension. Thus, it's difficult to control her rate with medications. I recommend an echocardiogram while hospitalized followed by elective cardioversion on Monday if she is still in atrial fibrillation. We also have to make sure there is no evidence of pulmonary vein stenosis given her previous atrial fibrillation ablation

## 2014-03-11 NOTE — Progress Notes (Signed)
Electrophysiologist: Dr. Isaias Sakai at [facility]  HPI  This is a 68 year old female who was added to my schedule today due to chest pain and shortness of breath. I have not seen her since 2011. I treated her at that time for persistent atrial fibrillation. She maintained in sinus rhythm mostly with Sotalol. After I left my previous practice, she established with [facility] cardiology. She informed me that she underwent catheter ablation in February of 2013 which was about a long and complex procedure and lasted 8 hours according to the patient. I spoke with her cardiologist today. She underwent stress testing about 2 years ago for dyspnea which was unremarkable. She was noted to be in sinus rhythm recently during an emergency room visit. Over the last 2 weeks she has experienced significant worsening of exertional dyspnea. This morning, she felt that she went into atrial fibrillation and became extremely out of breath with substernal chest discomfort, dizziness but no syncope. She is getting extremely dyspneic just walking in the hallway. Blood pressure is also low manually at 96/60.   Allergies  Allergen Reactions  . Codeine Nausea Only  . Latex      Current Outpatient Prescriptions on File Prior to Visit  Medication Sig Dispense Refill  . acetaminophen (TYLENOL) 500 MG tablet Take 500 mg by mouth every 6 (six) hours as needed for pain.      . cholestyramine (QUESTRAN) 4 G packet Take 1 packet by mouth as needed.      . clonazePAM (KLONOPIN) 0.5 MG tablet Take 0.5 mg by mouth 3 (three) times daily as needed.       . cyclobenzaprine (FLEXERIL) 5 MG tablet Take 1 tablet (5 mg total) by mouth 3 (three) times daily as needed for muscle spasms.  30 tablet  1  . escitalopram (LEXAPRO) 20 MG tablet Take 20 mg by mouth daily.       [name] esomeprazole (NEXIUM) 40 MG capsule Take 40 mg by mouth daily at 12 noon.      [name] ORENCIA 125 MG/ML SOLN Inject 1 each into the skin once a week.       . Rivaroxaban (XARELTO) 20  MG TABS tablet Take 20 mg by mouth daily with supper.      . sotalol (BETAPACE) 120 MG tablet Take 120 mg by mouth 2 (two) times daily.        No current facility-administered medications on file prior to visit.     Past Medical History  Diagnosis Date  . Rheumatoid arthritis     On methotrexate and orencia  . GERD (gastroesophageal reflux disease)   . Atrial fibrillation     s/p ablation 01/2012 in [facility] by Dr. Boyd Kerbs  . Urinary incontinence   . Colon polyps   . Depression with anxiety   . Barretts esophagus   . Rectal fistula   . Vitamin D deficiency   . A-fib      Past Surgical History  Procedure Laterality Date  . Cholecystectomy  1987  . Abdominal hysterectomy  1992  . Oophrectomy Bilateral 1992  . Cardiac electrophysiology study and ablation  2013     Family History  Problem Relation Age of Onset  . Depression Mother   . Cancer Mother 75    pancreatic cancer  . Cancer Father 68    colon cancer  . Cancer Sister 30    breast cancer  . Diabetes Brother      History   Social History  . Marital  Status: Single    Spouse Name: N/A    Number of Children: 0  . Years of Education: 16   Occupational History  . Not on file.   Social History Main Topics  . Smoking status: Former Smoker -- 1.00 packs/day for 40 years    Quit date: 12/16/2011  . Smokeless tobacco: Never Used  . Alcohol Use: Yes     Comment: Occasionally has a drink  . Drug Use: No  . Sexual Activity: Yes   Other Topics Concern  . Not on file   Social History Narrative   Ms. Due was born and reared in [place]. She attended [employer] and graduated in 1971 with her [place] in Education. 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 She then worked for a [employer] in [place]. She is single. She is currently retired. She loves reading. She loves to spend time with her dog.      ROS A 10 point review of system was  performed. It is negative other than that mentioned in the history of present illness.   PHYSICAL EXAM   BP 106/80  Pulse 106  Ht 5' 6.5" (1.689 m)  Wt 209 lb 12 oz (95.142 kg)  BMI 33.35 kg/m2 Constitutional: She is oriented to person, place, and time. She appears well-developed and well-nourished. She is in mild respiratory distress HENT: No nasal discharge.  Head: Normocephalic and atraumatic.  Eyes: Pupils are equal and round. No discharge.  Neck: Normal range of motion. Neck supple. No JVD present. No thyromegaly present.  Cardiovascular: Normal rate, irregular rhythm, normal heart sounds. Exam reveals no gallop and no friction rub. No murmur heard.  Pulmonary/Chest: Effort normal and breath sounds normal. No stridor. No respiratory distress. She has no wheezes. She has no rales. She exhibits no tenderness.  Abdominal: Soft. Bowel sounds are normal. She exhibits no distension. There is no tenderness. There is no rebound and no guarding.  Musculoskeletal: Normal range of motion. She exhibits no edema and no tenderness.  Neurological: She is alert and oriented to person, place, and time. Coordination normal.  Skin: Skin is warm and dry. No rash noted. She is not diaphoretic. No erythema. No pallor.  Psychiatric: She has a normal mood and affect. Her behavior is normal. Judgment and thought content normal.     EKG: Atrial fibrillation with ventricular rate of 106 beats per minute. Nonspecific T wave changes.   ASSESSMENT AND PLAN

## 2014-03-11 NOTE — Assessment & Plan Note (Signed)
The patient is having severe shortness of breath with activities and at rest. She seems to be in mild respiratory distress. She is in atrial fibrillation which might be contributing. However, ventricular rate is not significantly high. She is know to be very symptomatic when she goes into atrial fibrillation. However, I think it's important to exclude other etiologies. I recommend that presented to the emergency room for evaluation.  Recommend hospital admission, serial cardiac enzymes and CTA of the chest to evaluate for pulmonary embolism or other etiologies.

## 2014-03-11 NOTE — Patient Instructions (Signed)
Please go to the Emergency Department at [facility] right away.  The triage nurse has been notified.

## 2014-03-12 DIAGNOSIS — R0609 Other forms of dyspnea: Secondary | ICD-10-CM

## 2014-03-12 DIAGNOSIS — I4891 Unspecified atrial fibrillation: Secondary | ICD-10-CM

## 2014-03-12 DIAGNOSIS — I517 Cardiomegaly: Secondary | ICD-10-CM

## 2014-03-12 DIAGNOSIS — R0789 Other chest pain: Secondary | ICD-10-CM

## 2014-03-12 DIAGNOSIS — R0989 Other specified symptoms and signs involving the circulatory and respiratory systems: Secondary | ICD-10-CM

## 2014-03-12 LAB — BASIC METABOLIC PANEL
ANION GAP: 5 — AB (ref 7–16)
BUN: 14 mg/dL (ref 7–18)
Calcium, Total: 8.4 mg/dL — ABNORMAL LOW (ref 8.5–10.1)
Chloride: 107 mmol/L (ref 98–107)
Co2: 26 mmol/L (ref 21–32)
Creatinine: 0.83 mg/dL (ref 0.60–1.30)
EGFR (Non-African Amer.): >60
GLUCOSE: 80 mg/dL (ref 65–99)
OSMOLALITY: 275 (ref 275–301)
Potassium: 3.7 mmol/L (ref 3.5–5.1)
SODIUM: 138 mmol/L (ref 136–145)

## 2014-03-12 LAB — PRO B NATRIURETIC PEPTIDE: B-TYPE NATIURETIC PEPTID: 222 pg/mL — AB (ref 0–125)

## 2014-03-12 LAB — CBC WITH DIFFERENTIAL/PLATELET
BASOS ABS: 0.1 10*3/uL (ref 0.0–0.1)
BASOS PCT: 0.6 %
EOS PCT: 2.2 %
Eosinophil #: 0.2 10*3/uL (ref 0.0–0.7)
HCT: 35.7 % (ref 35.0–47.0)
HGB: 11 g/dL — ABNORMAL LOW (ref 12.0–16.0)
LYMPHS PCT: 40.5 %
Lymphocyte #: 3.7 10*3/uL — ABNORMAL HIGH (ref 1.0–3.6)
MCH: 24 pg — AB (ref 26.0–34.0)
MCHC: 30.9 g/dL — ABNORMAL LOW (ref 32.0–36.0)
MCV: 78 fL — ABNORMAL LOW (ref 80–100)
Monocyte #: 0.8 x10 3/mm (ref 0.2–0.9)
Monocyte %: 8.3 %
NEUTROS PCT: 48.4 %
Neutrophil #: 4.4 10*3/uL (ref 1.4–6.5)
Platelet: 170 10*3/uL (ref 150–440)
RBC: 4.59 10*6/uL (ref 3.80–5.20)
RDW: 16.4 % — ABNORMAL HIGH (ref 11.5–14.5)
WBC: 9.1 10*3/uL (ref 3.6–11.0)

## 2014-03-12 LAB — MAGNESIUM: Magnesium: 1.6 mg/dL — ABNORMAL LOW

## 2014-03-12 LAB — TROPONIN I: Troponin-I: <0.02 ng/mL

## 2014-03-13 LAB — BASIC METABOLIC PANEL
Anion Gap: 3 — ABNORMAL LOW (ref 7–16)
BUN: 10 mg/dL (ref 7–18)
CALCIUM: 9 mg/dL (ref 8.5–10.1)
Chloride: 105 mmol/L (ref 98–107)
Co2: 29 mmol/L (ref 21–32)
Creatinine: 0.82 mg/dL (ref 0.60–1.30)
EGFR (African American): >60
Glucose: 108 mg/dL — ABNORMAL HIGH (ref 65–99)
Osmolality: 273 (ref 275–301)
POTASSIUM: 4.1 mmol/L (ref 3.5–5.1)
Sodium: 137 mmol/L (ref 136–145)

## 2014-03-14 ENCOUNTER — Telehealth: Payer: Self-pay | Admitting: *Deleted

## 2014-03-14 ENCOUNTER — Ambulatory Visit: Payer: Medicare Other | Admitting: Cardiovascular Disease

## 2014-03-14 ENCOUNTER — Telehealth: Payer: Self-pay

## 2014-03-14 DIAGNOSIS — R0602 Shortness of breath: Secondary | ICD-10-CM

## 2014-03-14 NOTE — Telephone Encounter (Signed)
Scheduled stress test

## 2014-03-14 NOTE — Telephone Encounter (Signed)
Patient has financial assistance at [facility]. She would like to check about having it done with her doctor there first. She will call and let us know.

## 2014-03-14 NOTE — Telephone Encounter (Signed)
I reviewed her records. She went back into normal rhythm. If she is still having significant shortness of breath, then it is better to do a stress test. If she wants to have it done at [facility], that is fine too.

## 2014-03-14 NOTE — Telephone Encounter (Signed)
Scheduled patient for a Lexi Myoview.   Brandy Armstrong  Your caregiver has ordered a Stress Test with nuclear imaging. The purpose of this test is to evaluate the blood supply to your heart muscle. This procedure is referred to as a "Non-Invasive Stress Test." This is because other than having an IV started in your vein, nothing is inserted or "invades" your body. Cardiac stress tests are done to find areas of poor blood flow to the heart by determining the extent of coronary artery disease (CAD). Some patients exercise on a treadmill, which naturally increases the blood flow to your heart, while others who are  unable to walk on a treadmill due to physical limitations have a pharmacologic/chemical stress agent called Lexiscan . This medicine will mimic walking on a treadmill by temporarily increasing your coronary blood flow.   Please note: these test may take anywhere between 2-4 hours to complete  PLEASE REPORT TO [facility] AT THE FIRST DESK WILL DIRECT YOU WHERE TO GO  Date of Procedure:___________3/19/15__________________________  Arrival Time for Procedure:______09:45 am________________________  Instructions regarding medication:    PLEASE NOTIFY THE OFFICE AT LEAST 24 HOURS IN ADVANCE IF YOU ARE UNABLE TO KEEP YOUR APPOINTMENT.  [phone] AND  PLEASE NOTIFY NUCLEAR MEDICINE AT [facility] AT LEAST 24 HOURS IN ADVANCE IF YOU ARE UNABLE TO KEEP YOUR APPOINTMENT. [phone]  How to prepare for your Myoview test:  1. Do not eat or drink after midnight 2. No caffeine for 24 hours prior to test 3. No smoking 24 hours prior to test. 4. Your medication may be taken with water.  If your doctor stopped a medication because of this test, do not take that medication. 5. Ladies, please do not wear dresses.  Skirts or pants are appropriate. Please wear a short sleeve shirt. 6. No perfume, cologne or lotion. 7. Wear comfortable walking shoes. No heels!  Patient  verbalized understanding of instructions.

## 2014-03-14 NOTE — Telephone Encounter (Signed)
Pt states she was relased from the hospital yesterday, and states the Dr. Parks Ranger discharged he said for her to call and make a stress test appt. Pt states she is "runnig out of money, and doesn't know what to do". She states she is thinking about going to [place], would like to talk with a nurse and see if Dr. Fletcher Anon can review her notes from [place] this weekend and call her back.

## 2014-03-16 ENCOUNTER — Telehealth: Payer: Self-pay

## 2014-03-16 ENCOUNTER — Telehealth: Payer: Self-pay | Admitting: Adult Health

## 2014-03-16 ENCOUNTER — Encounter: Payer: Self-pay | Admitting: Cardiovascular Disease

## 2014-03-16 NOTE — Telephone Encounter (Signed)
Pt would like echo to be faxed to [facility] dr. [phone]. States the echo was performed at [facility], but wasn't complete. (?) Pt states she is not sure if Dr. Fletcher Anon has seen her echo or not. I did scan the echo from [place]. Please call.

## 2014-03-16 NOTE — Telephone Encounter (Signed)
Instructed patient to call [facility] records. Patient verbalized understanding.

## 2014-03-16 NOTE — Telephone Encounter (Signed)
Patient calling back in stating she doesn't want to sit on this referral. Please respond asap. She is willing to [facility].

## 2014-03-16 NOTE — Telephone Encounter (Signed)
Patient called in states she has seen a cardiologist. She would like to know if she could see a pulmonologist to have them check her breathing. She states she gives out of breath when she walks at times. She doesn't feel this issue is coming from her heart she feels it is coming from her breathing. Please advise.

## 2014-03-17 ENCOUNTER — Other Ambulatory Visit: Payer: Self-pay | Admitting: Adult Health

## 2014-03-17 ENCOUNTER — Ambulatory Visit: Payer: Self-pay | Admitting: Cardiovascular Disease

## 2014-03-17 DIAGNOSIS — R0602 Shortness of breath: Secondary | ICD-10-CM

## 2014-03-17 NOTE — Telephone Encounter (Signed)
Pt aware of apt with Dr. Lake Bells at the [facility] office. Brandy Armstrong also stated Brandy Armstrong went to [facility] and stayed from 3/13-3/15. I have req'd those records for your review. Brandy Armstrong also completed a stress test today.

## 2014-03-17 NOTE — Telephone Encounter (Signed)
lvm for pt to call our office.

## 2014-03-17 NOTE — Telephone Encounter (Signed)
Referral to [facility] made. Pt was recently seen by Fletcher Anon and he sent her to the hospital because she was in afib and symptomatic. Can you find out about that hospital visit?

## 2014-03-18 ENCOUNTER — Other Ambulatory Visit: Payer: Self-pay

## 2014-03-18 ENCOUNTER — Telehealth: Payer: Self-pay

## 2014-03-18 DIAGNOSIS — R0602 Shortness of breath: Secondary | ICD-10-CM

## 2014-03-18 NOTE — Telephone Encounter (Signed)
Reviewed results with patient. 

## 2014-03-18 NOTE — Telephone Encounter (Signed)
Pt would like stress test results.  

## 2014-03-18 NOTE — Telephone Encounter (Signed)
I have those records, [employer]

## 2014-03-22 ENCOUNTER — Telehealth: Payer: Self-pay | Admitting: *Deleted

## 2014-03-22 NOTE — Telephone Encounter (Signed)
Ok. Thanks!

## 2014-03-22 NOTE — Telephone Encounter (Signed)
Feels like she is in afib.  Please advise

## 2014-03-22 NOTE — Telephone Encounter (Signed)
Patient stated she was in Afib for about 3 hrs this morning. I told her she does not have to call unless she is feeling symptomatic or if afib sustains over 100.

## 2014-03-24 ENCOUNTER — Encounter: Payer: Self-pay | Admitting: Pulmonary Disease

## 2014-03-24 ENCOUNTER — Ambulatory Visit (INDEPENDENT_AMBULATORY_CARE_PROVIDER_SITE_OTHER): Payer: Medicare Other | Admitting: Pulmonary Disease

## 2014-03-24 VITALS — BP 122/76 | HR 76 | Ht 66.5 in | Wt 209.6 lb

## 2014-03-24 DIAGNOSIS — J449 Chronic obstructive pulmonary disease, unspecified: Secondary | ICD-10-CM

## 2014-03-24 DIAGNOSIS — R0602 Shortness of breath: Secondary | ICD-10-CM

## 2014-03-24 MED ORDER — ALBUTEROL SULFATE HFA 108 (90 BASE) MCG/ACT IN AERS: 2.0000 | INHALATION_SPRAY | Freq: Four times a day (QID) | RESPIRATORY_TRACT | Status: DC | PRN

## 2014-03-24 MED ORDER — TIOTROPIUM BROMIDE MONOHYDRATE 18 MCG IN CAPS: 18.0000 ug | ORAL_CAPSULE | Freq: Every day | RESPIRATORY_TRACT | Status: DC

## 2014-03-24 NOTE — Progress Notes (Signed)
Subjective:    Patient ID: Brandy Armstrong, female    DOB: 18-Aug-1946, 68 y.o.   MRN: [id number]  HPI  This is a very pleasant 68 year old female who is referred to our clinic today for evaluation of shortness of breath. She was recently hospitalized for atrial fibrillation with RVR and during that time she had a CT angiogram chest. That showed no evidence of pulmonary embolism or pulmonary fibrosis but there was a suggestion of early centrilobular emphysema. Next line she states that she has been out of breath for several months with activities. She says when she tries to walk her dog or carrying heavy objects she starts to get short of breath. She's even noticed it with minimal activities like getting out of the shower in the last few weeks. She does not have a cough and she has not had chest congestion.  She previously smoked one to one and a half packs of cigarettes daily for 40 years. She quit in 2013. Prior to that she had never had significant respiratory infections or problems. As a child she never had respiratory problems.  Chest has rheumatoid arthritis and took methotrexate off and on for several years and quit a year ago. During that time she never had any breathing trouble.  Past Medical History  Diagnosis Date  . Rheumatoid arthritis     On methotrexate and orencia  . GERD (gastroesophageal reflux disease)   . Atrial fibrillation     s/p ablation 01/2012 in [facility] by Dr. Boyd Kerbs  . Urinary incontinence   . Colon polyps   . Depression with anxiety   . Barretts esophagus   . Rectal fistula   . Vitamin D deficiency   . A-fib      Family History  Problem Relation Age of Onset  . Depression Mother   . Cancer Mother 26    pancreatic cancer  . Cancer Father 15    colon cancer  . Cancer Sister 30    breast cancer  . Diabetes Brother      History   Social History  . Marital Status: Single    Spouse Name: N/A    Number of Children: 0  . Years of Education: 16    Occupational History  . Not on file.   Social History Main Topics  . Smoking status: Former Smoker -- 1.00 packs/day for 40 years    Quit date: 12/16/2011  . Smokeless tobacco: Never Used  . Alcohol Use: Yes     Comment: Occasionally has a drink  . Drug Use: No  . Sexual Activity: Yes   Other Topics Concern  . Not on file   Social History Narrative   Ms. Magnussen was born and reared in [place]. She attended [employer] and graduated in 1971 with her [place] in Education. 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 She then worked for a [employer] in [place]. She is single. She is currently retired. She loves reading. She loves to spend time with her dog.      Allergies  Allergen Reactions  . Codeine Nausea Only  . Latex      Outpatient Prescriptions Prior to Visit  Medication Sig Dispense Refill  . acetaminophen (TYLENOL) 500 MG tablet Take 500 mg by mouth every 6 (six) hours as needed for pain.      . cholestyramine (QUESTRAN) 4 G packet Take 1 packet by mouth as needed.      [name]  clonazePAM (KLONOPIN) 0.5 MG tablet Take 0.5 mg by mouth 3 (three) times daily as needed.       [name] escitalopram (LEXAPRO) 20 MG tablet Take 20 mg by mouth daily.       [name] esomeprazole (NEXIUM) 40 MG capsule Take 40 mg by mouth daily at 12 noon.      [name] ORENCIA 125 MG/ML SOLN Inject 1 each into the skin once a week.       . predniSONE (DELTASONE) 5 MG tablet Take 5 mg by mouth as needed.      . Rivaroxaban (XARELTO) 20 MG TABS tablet Take 20 mg by mouth daily with supper.      . sotalol (BETAPACE) 120 MG tablet Take 120 mg by mouth 2 (two) times daily.       . cyclobenzaprine (FLEXERIL) 5 MG tablet Take 1 tablet (5 mg total) by mouth 3 (three) times daily as needed for muscle spasms.  30 tablet  1   No facility-administered medications prior to visit.       Review of Systems  Constitutional: Negative for fever and unexpected weight  change.  HENT: Negative for congestion, dental problem, ear pain, nosebleeds, postnasal drip, rhinorrhea, sinus pressure, sneezing, sore throat and trouble swallowing.   Eyes: Negative for redness and itching.  Respiratory: Positive for shortness of breath. Negative for cough, chest tightness and wheezing.   Cardiovascular: Negative for palpitations and leg swelling.  Gastrointestinal: Negative for nausea and vomiting.  Genitourinary: Negative for dysuria.  Musculoskeletal: Negative for joint swelling.  Skin: Negative for rash.  Neurological: Negative for headaches.  Hematological: Does not bruise/bleed easily.  Psychiatric/Behavioral: Negative for dysphoric mood. The patient is nervous/anxious.        Objective:   Physical Exam Filed Vitals:   03/24/14 1151  BP: 122/76  Pulse: 76  Height: 5' 6.5" (1.689 m)  Weight: 209 lb 9.6 oz (95.074 kg)  SpO2: 96%  RA Ambulated 500 feet on RA, did not desaturate less than 94%  Gen: well appearing, no acute distress HEENT: NCAT, PERRL, EOMi, OP clear, neck supple without masses PULM: CTA B CV: RRR, no mgr, no JVD AB: BS+, soft, nontender, no hsm Ext: warm, no edema, no clubbing, no cyanosis Derm: no rash or skin breakdown Neuro: A&Ox4, CN II-XII intact, strength 5/5 in all 4 extremities  01/2014 CT angio chest reviewed> no PE, no scarring or fibrosis, motion artifact limited, but there is suggestion of mild centrilobular emphysema upper lobes bilaterally     Assessment & Plan:   COPD (chronic obstructive pulmonary disease) COPD: GOLD Grade C, Stage III by spirometry Combined recommendations from the [place], [employer] of [employer]icians, Investment banker, corporate, European Respiratory Society (Qaseem A et al, Ann Intern Med. 2011;155(3):179) recommends tobacco cessation, pulmonary rehab (for symptomatic patients with an FEV1 < 50% predicted), supplemental oxygen (for patients with SaO2 <88% or paO2 <55),  and appropriate bronchodilator therapy.  In regards to long acting bronchodilators, they recommend monotherapy (FEV1 60-80% with symptoms weak evidence, FEV1 with symptoms <60% strong evidence), or combination therapy (FEV1 <60% with symptoms, strong recommendation, moderate evidence).  One should also provide patients with annual immunizations and consider therapy for prevention of COPD exacerbations (ie. roflumilast or azithromycin) when appopriate.  -O2 therapy: Not indicated -Immunizations: Flu UTD, discussed pneumonia vaccine today, she says one of her doctors told her to not take it because of her RA; I advised that this is not a contraindication; she prefers to  check with her rheumatologist first -Tobacco use: quit 2013 -Exercise: pulmonary rehab referral -Bronchodilator therapy: Start Spiriva daily and albuterol prn -Exacerbation prevention: Spiriva    Updated Medication List Outpatient Encounter Prescriptions as of 03/24/2014  Medication Sig  . acetaminophen (TYLENOL) 500 MG tablet Take 500 mg by mouth every 6 (six) hours as needed for pain.  . cholestyramine (QUESTRAN) 4 G packet Take 1 packet by mouth as needed.  . clonazePAM (KLONOPIN) 0.5 MG tablet Take 0.5 mg by mouth 3 (three) times daily as needed.   . diltiazem (CARDIZEM) 120 MG tablet Take 120 mg by mouth daily.  [name] escitalopram (LEXAPRO) 20 MG tablet Take 20 mg by mouth daily.   [name] esomeprazole (NEXIUM) 40 MG capsule Take 40 mg by mouth daily at 12 noon.  . furosemide (LASIX) 20 MG tablet Take 20 mg by mouth daily.  [name] ORENCIA 125 MG/ML SOLN Inject 1 each into the skin once a week.   . potassium chloride (K-DUR,KLOR-CON) 10 MEQ tablet Take 10 mEq by mouth daily.  . predniSONE (DELTASONE) 5 MG tablet Take 5 mg by mouth as needed.  . Rivaroxaban (XARELTO) 20 MG TABS tablet Take 20 mg by mouth daily with supper.  . sotalol (BETAPACE) 120 MG tablet Take 120 mg by mouth 2 (two) times daily.   [name] albuterol (PROVENTIL HFA;VENTOLIN  HFA) 108 (90 BASE) MCG/ACT inhaler Inhale 2 puffs into the lungs every 6 (six) hours as needed for wheezing or shortness of breath.  . tiotropium (SPIRIVA HANDIHALER) 18 MCG inhalation capsule Place 1 capsule (18 mcg total) into inhaler and inhale daily.  . [DISCONTINUED] cyclobenzaprine (FLEXERIL) 5 MG tablet Take 1 tablet (5 mg total) by mouth 3 (three) times daily as needed for muscle spasms.

## 2014-03-24 NOTE — Patient Instructions (Signed)
Use Spiriva once daily  Use albuterol 2 puffs every four house as needed for shortness of breath Get a flu shot every year We will arrange pulmonary rehab at [facility] We will see you back in 3 months in [place]

## 2014-03-24 NOTE — Assessment & Plan Note (Addendum)
COPD: GOLD Grade C, Stage III by spirometry Combined recommendations from the [place], [employer], Investment banker, corporate, [place] (Qaseem A et al, Ann Intern Med. 2011;155(3):179) recommends tobacco cessation, pulmonary rehab (for symptomatic patients with an FEV1 < 50% predicted), supplemental oxygen (for patients with SaO2 <88% or paO2 <55), and appropriate bronchodilator therapy.  In regards to long acting bronchodilators, they recommend monotherapy (FEV1 60-80% with symptoms weak evidence, FEV1 with symptoms <60% strong evidence), or combination therapy (FEV1 <60% with symptoms, strong recommendation, moderate evidence).  One should also provide patients with annual immunizations and consider therapy for prevention of COPD exacerbations (ie. roflumilast or azithromycin) when appopriate.  -O2 therapy: Not indicated -Immunizations: Flu UTD, discussed pneumonia vaccine today, she says one of her doctors told her to not take it because of her RA; I advised that this is not a contraindication; she prefers to check with her rheumatologist first -Tobacco use: quit 2013 -Exercise: pulmonary rehab referral -Bronchodilator therapy: Start Spiriva daily and albuterol prn -Exacerbation prevention: Spiriva

## 2014-03-29 ENCOUNTER — Telehealth: Payer: Self-pay | Admitting: Pulmonary Disease

## 2014-03-29 NOTE — Telephone Encounter (Signed)
Called and spoke with pt. Made her aware it takes a couple weeks to hear from pulm rehab. She voiced her understanding.

## 2014-03-30 ENCOUNTER — Telehealth: Payer: Self-pay | Admitting: Pulmonary Disease

## 2014-03-30 NOTE — Telephone Encounter (Signed)
[id number] x1 

## 2014-03-31 NOTE — Telephone Encounter (Signed)
Patient returning call.

## 2014-03-31 NOTE — Telephone Encounter (Signed)
Pt is aware of CY's recs. Nothing further is needed.

## 2014-03-31 NOTE — Telephone Encounter (Signed)
Spoke with pt. Reports that she is having a "horrific" cough. Hoarseness, sneezing, sinus congestion and PND are also present. Onset was a few days ago. Would like something called in.  Allergies  Allergen Reactions  . Codeine Nausea Only  . Latex    Current Outpatient Prescriptions on File Prior to Visit  Medication Sig Dispense Refill  . acetaminophen (TYLENOL) 500 MG tablet Take 500 mg by mouth every 6 (six) hours as needed for pain.      [name] albuterol (PROVENTIL HFA;VENTOLIN HFA) 108 (90 BASE) MCG/ACT inhaler Inhale 2 puffs into the lungs every 6 (six) hours as needed for wheezing or shortness of breath.  1 Inhaler  6  . cholestyramine (QUESTRAN) 4 G packet Take 1 packet by mouth as needed.      . clonazePAM (KLONOPIN) 0.5 MG tablet Take 0.5 mg by mouth 3 (three) times daily as needed.       . diltiazem (CARDIZEM) 120 MG tablet Take 120 mg by mouth daily.      [name] escitalopram (LEXAPRO) 20 MG tablet Take 20 mg by mouth daily.       [name] esomeprazole (NEXIUM) 40 MG capsule Take 40 mg by mouth daily at 12 noon.      . furosemide (LASIX) 20 MG tablet Take 20 mg by mouth daily.      [name] ORENCIA 125 MG/ML SOLN Inject 1 each into the skin once a week.       . potassium chloride (K-DUR,KLOR-CON) 10 MEQ tablet Take 10 mEq by mouth daily.      . predniSONE (DELTASONE) 5 MG tablet Take 5 mg by mouth as needed.      . Rivaroxaban (XARELTO) 20 MG TABS tablet Take 20 mg by mouth daily with supper.      . sotalol (BETAPACE) 120 MG tablet Take 120 mg by mouth 2 (two) times daily.       [name] tiotropium (SPIRIVA HANDIHALER) 18 MCG inhalation capsule Place 1 capsule (18 mcg total) into inhaler and inhale daily.  30 capsule  2   No current facility-administered medications on file prior to visit.    CY - please advise in BQ's absence. Thanks!

## 2014-03-31 NOTE — Telephone Encounter (Signed)
Recommend antihistamine like allegra, one daily                       Also Mucinex-DM

## 2014-04-05 ENCOUNTER — Telehealth: Payer: Self-pay | Admitting: Pulmonary Disease

## 2014-04-05 MED ORDER — ALBUTEROL SULFATE HFA 108 (90 BASE) MCG/ACT IN AERS: 2.0000 | INHALATION_SPRAY | Freq: Four times a day (QID) | RESPIRATORY_TRACT | Status: DC | PRN

## 2014-04-05 NOTE — Telephone Encounter (Signed)
Spoke with [facility]. Her insurance is requesting her to use proair now instead of proventil. I gave VO for the proair Pt is aware of this as well. Nothing further needed

## 2014-04-08 ENCOUNTER — Telehealth: Payer: Self-pay | Admitting: Pulmonary Disease

## 2014-04-08 NOTE — Telephone Encounter (Signed)
Would prefer to judge her response to Spiriva first before switching to Anoro; Spiriva is a drug we have more experience with than Anoro (new drug on market) Nebulizer> discuss next visit Exercise> rec consider a local gym or community center

## 2014-04-08 NOTE — Telephone Encounter (Signed)
I called and made pt aware of BQ response. Nothing further needed

## 2014-04-08 NOTE — Telephone Encounter (Signed)
Spoke with pt.  She has heard people talk about Anoro and wants to know if this would be a good option for her.  Pt also has questions about her benefiting from a nebulizer.  Pt has checked into pulmonary rehab and her copay will be $50/ visit.  They are checking into another form of exercise that could be a cheaper alternative .  Pt aware that Dr Lake Bells is out of office today and will respond to message next week.

## 2014-04-13 ENCOUNTER — Telehealth: Payer: Self-pay | Admitting: Pulmonary Disease

## 2014-04-13 NOTE — Telephone Encounter (Signed)
Called and spoke with pt. She reports she has appt in [place] 05/03/14. Advised her if she is feeling more SOB then we need to see her. She would need to be seen in [place]. She scheduled an apt to see TP Friday.

## 2014-04-13 NOTE — Telephone Encounter (Signed)
I received one page three minutes ago and it had no number to call back or a name from who sent the message  For future reference you need to page a number for me to call or call me on my cell phone  For this patient:  If she is more short of breath then we need to see her in clinic I recommend that she use her albuterol inhaler 2 puffs every four hours as needed until then and keep using the Spiriva

## 2014-04-13 NOTE — Telephone Encounter (Signed)
Spoke with pt. She does spiriva every AM. She is having to use her rescue inhaler about this time of the day. She is feeling winded when she tries to do any activity in the afternoon. spiriva does not seem to last throughout the day. Requesting further recs. Please advise BQ thanks  Allergies  Allergen Reactions  . Codeine Nausea Only  . Latex

## 2014-04-13 NOTE — Telephone Encounter (Signed)
Lake Bells has been paged twice and no response Will forward to DOD. Please advise [place] thanks

## 2014-04-15 ENCOUNTER — Ambulatory Visit (INDEPENDENT_AMBULATORY_CARE_PROVIDER_SITE_OTHER): Payer: Medicare Other | Admitting: Adult Health

## 2014-04-15 ENCOUNTER — Encounter: Payer: Self-pay | Admitting: Adult Health

## 2014-04-15 VITALS — BP 126/76 | HR 79 | Temp 98.1°F | Ht 65.5 in | Wt 211.2 lb

## 2014-04-15 DIAGNOSIS — Z23 Encounter for immunization: Secondary | ICD-10-CM

## 2014-04-15 DIAGNOSIS — J449 Chronic obstructive pulmonary disease, unspecified: Secondary | ICD-10-CM

## 2014-04-15 DIAGNOSIS — J4489 Other specified chronic obstructive pulmonary disease: Secondary | ICD-10-CM

## 2014-04-15 NOTE — Patient Instructions (Addendum)
Continue on Spiriva 1 puff daily  Begin BREO 1 puff daily  Pneumovax today .  follow up Dr. Lake Bells in 4 weeks as planned and As needed   Please contact office for sooner follow up if symptoms do not improve or worsen or seek emergency care

## 2014-04-18 MED ORDER — FLUTICASONE FUROATE-VILANTEROL 100-25 MCG/INH IN AEPB: 1.0000 | INHALATION_SPRAY | Freq: Every day | RESPIRATORY_TRACT | Status: DC

## 2014-04-18 NOTE — Progress Notes (Signed)
Subjective:    Patient ID: Brandy Armstrong, female    DOB: 12-16-1946, 68 y.o.   MRN: [id number]  HPI 68 yo pleasant 68 year old female who is referred to our clinic today for evaluation of shortness of breath 03/24/14  Former smoker   03/24/14 IOV   She was recently hospitalized for atrial fibrillation with RVR and during that time she had a CT angiogram chest. That showed no evidence of pulmonary embolism or pulmonary fibrosis but there was a suggestion of early centrilobular emphysema.  she states that she has been out of breath for several months with activities. She says when she tries to walk her dog or carrying heavy objects she starts to get short of breath. She's even noticed it with minimal activities like getting out of the shower in the last few weeks. She does not have a cough and she has not had chest congestion.  She previously smoked one to one and a half packs of cigarettes daily for 40 years. She quit in 2013. Prior to that she had never had significant respiratory infections or problems. As a child she never had respiratory problems.  Hx of  rheumatoid arthritis and took methotrexate off and on for several years and quit a year ago. During that time she never had any breathing trouble. >>  04/15/14 Acute OV  Complains of persistent dyspnea. Feels better in am after Spiriva but seems to wear off in the evening.  Denies any real wheezing, tightness, cough, f/c/s, hemoptysis, nausea, vomiting Spirometry last ov showed FEV1 44%, ratio 66.  Of note on high dose sotalol .  Recent card stress test 3/19 shoed was neg for ischemia   Review of Systems Constitutional:   No  weight loss, night sweats,  Fevers, chills, fatigue, or  lassitude.  HEENT:   No headaches,  Difficulty swallowing,  Tooth/dental problems, or  Sore throat,                No sneezing, itching, ear ache, nasal congestion, post nasal drip,   CV:  No chest pain,  Orthopnea, PND, swelling in lower extremities,  anasarca, dizziness, palpitations, syncope.   GI  No heartburn, indigestion, abdominal pain, nausea, vomiting, diarrhea, change in bowel habits, loss of appetite, bloody stools.   Resp: No shortness of breath with exertion or at rest.  No excess mucus, no productive cough,  No non-productive cough,  No coughing up of blood.  No change in color of mucus.  No wheezing.  No chest wall deformity  Skin: no rash or lesions.  GU: no dysuria, change in color of urine, no urgency or frequency.  No flank pain, no hematuria   MS:  No joint pain or swelling.  No decreased range of motion.  No back pain.  Psych:  No change in mood or affect. No depression or anxiety.  No memory loss.   01/2014 CT angio chest reviewed> no PE, no scarring or fibrosis, motion artifact limited, but there is suggestion of mild centrilobular emphysema upper lobes bilaterally          Objective:   Physical Exam GEN: A/Ox3; pleasant , NAD, well nourished   HEENT:  [place]/AT,  EACs-clear, TMs-wnl, NOSE-clear, THROAT-clear, no lesions, no postnasal drip or exudate noted.   NECK:  Supple w/ fair ROM; no JVD; normal carotid impulses w/o bruits; no thyromegaly or nodules palpated; no lymphadenopathy.  RESP  Clear  P & A; w/o, wheezes/ rales/ or rhonchi.no accessory muscle use, no dullness  to percussion  CARD:  RRR, no m/r/g  , no peripheral edema, pulses intact, no cyanosis or clubbing.  GI:   Soft & nt; nml bowel sounds; no organomegaly or masses detected.  Musco: Warm bil, no deformities or joint swelling noted.   Neuro: alert, no focal deficits noted.    Skin: Warm, no lesions or rashes         Assessment & Plan:

## 2014-04-18 NOTE — Progress Notes (Signed)
Reviewed and agree with above.

## 2014-04-18 NOTE — Assessment & Plan Note (Addendum)
Remains symptomatic on Spiriva  Explained only been on therapy for few weeks may need more time to help with symptom management.  Encouraged on pulm rehab. Referral. Will try BREO  Pt requests PVX today  Could consider change Sotalol to more selective beta blocker as could be contributing to her dyspnea . -will leave to cardiologist   Plan  Continue on Spiriva 1 puff daily  Begin BREO 1 puff daily  Pneumovax today .  follow up Dr. Lake Bells in 4 weeks as planned and As needed   Please contact office for sooner follow up if symptoms do not improve or worsen or seek emergency care

## 2014-04-29 ENCOUNTER — Telehealth: Payer: Self-pay | Admitting: Pulmonary Disease

## 2014-04-29 MED ORDER — CLOTRIMAZOLE 10 MG MT TROC: 10.0000 mg | Freq: Four times a day (QID) | OROMUCOSAL | Status: DC | PRN

## 2014-04-29 NOTE — Telephone Encounter (Signed)
Clotrimazole troche 10 #12 one qid prn

## 2014-04-29 NOTE — Telephone Encounter (Signed)
Called and spoke with pt and she stated that the she has thrush x 2-3 days.  She stated that her tongue is coated and feels funny.  Pt is requesting something to be called in for her.  BQ not in the office today.  MW please advise. Thanks  Allergies  Allergen Reactions  . Codeine Nausea Only  . Latex      Current Outpatient Prescriptions on File Prior to Visit  Medication Sig Dispense Refill  . acetaminophen (TYLENOL) 500 MG tablet Take 500 mg by mouth every 6 (six) hours as needed for pain.      [name] albuterol (PROAIR HFA) 108 (90 BASE) MCG/ACT inhaler Inhale 2 puffs into the lungs every 6 (six) hours as needed for wheezing or shortness of breath.  1 Inhaler  5  . cholestyramine (QUESTRAN) 4 G packet Take 1 packet by mouth as needed.      . clonazePAM (KLONOPIN) 0.5 MG tablet Take 0.5 mg by mouth 3 (three) times daily as needed.       . diltiazem (CARDIZEM) 120 MG tablet Take 120 mg by mouth daily.      [name] escitalopram (LEXAPRO) 20 MG tablet Take 20 mg by mouth daily.       [name] esomeprazole (NEXIUM) 40 MG capsule Take 40 mg by mouth daily at 12 noon.      . Fluticasone Furoate-Vilanterol (BREO ELLIPTA) 100-25 MCG/INH AEPB Inhale 1 puff into the lungs daily.  30 each  1  . furosemide (LASIX) 20 MG tablet Take 20 mg by mouth daily as needed.       [name] ORENCIA 125 MG/ML SOLN Inject 1 each into the skin once a week.       . potassium chloride (K-DUR,KLOR-CON) 10 MEQ tablet Take 10 mEq by mouth daily.      . predniSONE (DELTASONE) 5 MG tablet Take 5 mg by mouth as needed.      . Rivaroxaban (XARELTO) 20 MG TABS tablet Take 20 mg by mouth daily with supper.      . sotalol (BETAPACE) 120 MG tablet Take 120 mg by mouth 2 (two) times daily.       [name] tiotropium (SPIRIVA HANDIHALER) 18 MCG inhalation capsule Place 1 capsule (18 mcg total) into inhaler and inhale daily.  30 capsule  2   No current facility-administered medications on file prior to visit.

## 2014-04-29 NOTE — Telephone Encounter (Signed)
Pt aware of recs. Nothing further needed RX sent

## 2014-05-03 ENCOUNTER — Encounter: Payer: Self-pay | Admitting: Pulmonary Disease

## 2014-05-03 ENCOUNTER — Ambulatory Visit (INDEPENDENT_AMBULATORY_CARE_PROVIDER_SITE_OTHER): Payer: Medicare Other | Admitting: Pulmonary Disease

## 2014-05-03 VITALS — BP 132/66 | HR 71 | Ht 66.5 in | Wt 207.0 lb

## 2014-05-03 DIAGNOSIS — J449 Chronic obstructive pulmonary disease, unspecified: Secondary | ICD-10-CM

## 2014-05-03 MED ORDER — FLUTICASONE-SALMETEROL 250-50 MCG/DOSE IN AEPB: 1.0000 | INHALATION_SPRAY | Freq: Two times a day (BID) | RESPIRATORY_TRACT | Status: DC

## 2014-05-03 NOTE — Assessment & Plan Note (Signed)
She does not think that the Breo is lasting all day long.  She is deconditioned and she really needs to exercise more.  She does struggle with significant shortness of breath which is due to the fact that she has severe COPD.  Plan: -Start regular exercise -Stop Breo -Start Advair -Continue Spiriva -Followup 6 months

## 2014-05-03 NOTE — Progress Notes (Signed)
Subjective:    Patient ID: Brandy Armstrong, female    DOB: Mar 24, 1946, 68 y.o.   MRN: [id number]  Synopsis: First seen by [facility] in 2015 for GOLD C COPD 03/24/2014 Simple Spirometry > Raito 66%, FEV1 1.12L (44% pred) 01/2014 CT angio chest reviewed> no PE, no scarring or fibrosis, motion artifact limited, but there is suggestion of mild centrilobular emphysema upper lobes bilaterally She previously smoked 1 pack a day for 40 plus years and quit    HPI  05/03/2014 ROV > Javeah says that the inhalers work right after taking them, but she feels more short of breath at th end of the day.  She is having a lot of dry mouth.  She is fatigued all the time. She tried pulmonary rehab, but she can't afford the $50 copay.  She has been considering going on disability.  She continues to be in afib occasionally.   She wishes that there was a medicine that would make her feel les short of breath all day.  She likes the Spiriva and thinks that it helps.  She is not sure about the [facility].    Past Medical History  Diagnosis Date  . Rheumatoid arthritis     On methotrexate and orencia  . GERD (gastroesophageal reflux disease)   . Atrial fibrillation     s/p ablation 01/2012 in [facility] by Dr. Boyd Kerbs  . Urinary incontinence   . Colon polyps   . Depression with anxiety   . Barretts esophagus   . Rectal fistula   . Vitamin D deficiency   . A-fib      Review of Systems     Objective:   Physical Exam Filed Vitals:   05/03/14 1129  BP: 132/66  Pulse: 71  Height: 5' 6.5" (1.689 m)  Weight: 207 lb (93.895 kg)  SpO2: 96%   RA  Gen: well appearing, no acute distress HEENT: NCAT, EOMi, OP clear,  PULM: CTA B CV: RRR, no mgr, no JVD AB: BS+, soft, nontender, no hsm Ext: warm, no edema, no clubbing, no cyanosis Derm: no rash or skin breakdown      Assessment & Plan:   COPD (chronic obstructive pulmonary disease) She does not think that the Breo is lasting all day long.  She is deconditioned  and she really needs to exercise more.  She does struggle with significant shortness of breath which is due to the fact that she has severe COPD.  Plan: -Start regular exercise -Stop Breo -Start Advair -Continue Spiriva -Followup 6 months   Updated Medication List Outpatient Encounter Prescriptions as of 05/03/2014  Medication Sig  . acetaminophen (TYLENOL) 500 MG tablet Take 500 mg by mouth every 6 (six) hours as needed for pain.  [name] albuterol (PROAIR HFA) 108 (90 BASE) MCG/ACT inhaler Inhale 2 puffs into the lungs every 6 (six) hours as needed for wheezing or shortness of breath.  . cholestyramine (QUESTRAN) 4 G packet Take 1 packet by mouth as needed.  . clonazePAM (KLONOPIN) 0.5 MG tablet Take 0.5 mg by mouth 3 (three) times daily as needed.   . clotrimazole (MYCELEX) 10 MG troche Take 1 tablet (10 mg total) by mouth 4 (four) times daily as needed.  [name] escitalopram (LEXAPRO) 20 MG tablet Take 20 mg by mouth daily.   [name] esomeprazole (NEXIUM) 40 MG capsule Take 40 mg by mouth daily at 12 noon.  [name] ORENCIA 125 MG/ML SOLN Inject 1 each into the skin once a week.   [name]  predniSONE (DELTASONE) 5 MG tablet Take 5 mg by mouth as needed.  . Rivaroxaban (XARELTO) 20 MG TABS tablet Take 20 mg by mouth daily with supper.  . sotalol (BETAPACE) 120 MG tablet Take 120 mg by mouth 2 (two) times daily.   [name] tiotropium (SPIRIVA HANDIHALER) 18 MCG inhalation capsule Place 1 capsule (18 mcg total) into inhaler and inhale daily.  . [DISCONTINUED] Fluticasone Furoate-Vilanterol (BREO ELLIPTA) 100-25 MCG/INH AEPB Inhale 1 puff into the lungs daily.  . Fluticasone-Salmeterol (ADVAIR DISKUS) 250-50 MCG/DOSE AEPB Inhale 1 puff into the lungs 2 (two) times daily.  . furosemide (LASIX) 20 MG tablet Take 20 mg by mouth daily as needed.   . potassium chloride (K-DUR,KLOR-CON) 10 MEQ tablet Take 10 mEq by mouth daily.  . [DISCONTINUED] diltiazem (CARDIZEM) 120 MG tablet Take 120 mg by mouth daily.

## 2014-05-03 NOTE — Patient Instructions (Signed)
Stop Breo Use advair twice a day Rinse your mouth with listerine after using the inhaler Exercise regularly We will see you back in 6 months or sooner if needed

## 2014-05-11 ENCOUNTER — Telehealth: Payer: Self-pay | Admitting: Pulmonary Disease

## 2014-05-11 MED ORDER — FLUTICASONE FUROATE-VILANTEROL 100-25 MCG/INH IN AEPB: 1.0000 | INHALATION_SPRAY | Freq: Every day | RESPIRATORY_TRACT | Status: DC

## 2014-05-11 NOTE — Telephone Encounter (Signed)
Ok for her to go back on breo, but give Dr. Lake Bells feedback after a few weeks to let him know how things are going.

## 2014-05-11 NOTE — Telephone Encounter (Signed)
Called and spoke with pt and she is aware of [place] recs.  Pt is aware of rx for the breo that has been sent to the pharmacy and pt is aware to call back in a few weeks to give update to BQ.  Pt voiced her understanding and nothing further is needed.

## 2014-05-11 NOTE — Telephone Encounter (Signed)
Spoke with patient in regards to her inhalers. Pt states she is currently taking Spiriva 1 puff qd and Advair 250-107mcg 1 puff BID  Pt c/o chest tightness-not sure if related to heart trouble or not  Pt states that she feels the Advair is not working the best for her. Pt states that she was switched from [place] not working 100% to control her symptoms. Pt interested in maybe trying Breo again--worked better than the Advair currently is. Pt would like to try Breo again or any other recs per [facility] or Dr on call.   Please advise Dr Gwenette Greet as Dr Sherrin Daisy if out of the office. Thanks.

## 2014-05-16 ENCOUNTER — Telehealth: Payer: Self-pay | Admitting: Pulmonary Disease

## 2014-05-16 NOTE — Telephone Encounter (Signed)
Pt calling with some concerns with her medications.  Pt started on Sprivia 03/24/14 TP added Breo at 04/15/14 OV BQ stopped Breo 05/03/14 OV d/t pt feeling that the combined medications did not have a lasting effect Started Advair 05/03/14 by BQ Pt reports switching from Advair back to [facility] 05/11/14 d/t not feeling that Advair was working as well as [facility] was. ( see message 05/11/14 with [place]) Kathee Delton, MD at 05/11/2014 5:29 PM     [place] for her to go back on breo, but give Dr. Lake Bells feedback after a few weeks to let him know how things are going.    (1)  Patient has been on Breo consistently x 5 days and states that she feels that she is still not getting "lasting results" with this team of medications.  Pt states that she has been doing some research online and would like to try Anoro.  Pt also concerned with how and when to take her medications--states that she does not know a good time to take the Breo--explained to the patient that it is prescribed for once daily so therefore she can pick "when" throughout the day she finds the medication works best for her. Pt expressed understanding but states that she wants Dr Lake Bells to clarify her instructions.   (2)  Pt stated too that she thinks she has been using her Spiriva wrong--pt has been using 2 capsules daily since 03/24/14, pt not sure why she has been using this much medication but is aware now that she is supposed to only be taking ONCE DAILY.  Please advise Dr Lake Bells of the above 2 concerns. Thanks.

## 2014-05-17 NOTE — Telephone Encounter (Signed)
She has changed medicines so much that I am thoroughly confused.  Tell her to take Spiriva and Breo once daily at 9:00 AM sharp.  Stick with this for a month and then report back.  Exercise regularly during that month.  If she gets worse (increasing dyspnea) then she needs to see [place] in clinic.  Switching to Anoro would mean that she would actually have to stop taking an inhaled corticosteroid and would just confuse things

## 2014-05-18 NOTE — Telephone Encounter (Signed)
Called spoke with pt. Aware of recs.  Nothing further needed 

## 2014-05-24 ENCOUNTER — Telehealth: Payer: Self-pay | Admitting: Adult Health

## 2014-05-24 NOTE — Telephone Encounter (Signed)
States she has found a noticeable change on her left breast and would like to go to [employer].  States it feels like a painful fat mass, feels like it may move around.  States she feels it sometimes and sometimes cannot find it, first noticed it about 2-3 months ago.  Asking if she can go for imaging without having to be seen by provider.

## 2014-05-24 NOTE — Telephone Encounter (Signed)
She will need an appt.

## 2014-05-26 ENCOUNTER — Telehealth: Payer: Self-pay | Admitting: Pulmonary Disease

## 2014-05-26 ENCOUNTER — Ambulatory Visit (INDEPENDENT_AMBULATORY_CARE_PROVIDER_SITE_OTHER): Payer: Medicare Other | Admitting: Adult Health

## 2014-05-26 ENCOUNTER — Encounter: Payer: Self-pay | Admitting: Adult Health

## 2014-05-26 VITALS — BP 128/64 | HR 71 | Temp 98.2°F | Resp 14 | Ht 66.5 in | Wt 209.0 lb

## 2014-05-26 DIAGNOSIS — N63 Unspecified lump in unspecified breast: Secondary | ICD-10-CM

## 2014-05-26 DIAGNOSIS — R928 Other abnormal and inconclusive findings on diagnostic imaging of breast: Secondary | ICD-10-CM

## 2014-05-26 NOTE — Patient Instructions (Signed)
I am sending you for a diagnostic mammogram and ultrasound  Your appointment is on June 4th at 10:00 am at [facility].

## 2014-05-26 NOTE — Progress Notes (Signed)
Patient ID: Brandy Armstrong, female   DOB: 06-01-1946, 68 y.o.   MRN: [id number]   Subjective:    Patient ID: Brandy Armstrong, female    DOB: [birth year], 68 y.o.   MRN: [id number]  HPI  Pt presents to clinic with reports of palpable mass on her left breast. She noticed it a while back. She reports that it is very painful to touch. She has not had a mammogram in 2 years. No discharge from nipple. No inversion of nipples.  Past Medical History  Diagnosis Date  . Rheumatoid arthritis     On methotrexate and orencia  . GERD (gastroesophageal reflux disease)   . Atrial fibrillation     s/p ablation 01/2012 in [facility] by Dr. Boyd Kerbs  . Urinary incontinence   . Colon polyps   . Depression with anxiety   . Barretts esophagus   . Rectal fistula   . Vitamin D deficiency   . A-fib     Current Outpatient Prescriptions on File Prior to Visit  Medication Sig Dispense Refill  . acetaminophen (TYLENOL) 500 MG tablet Take 500 mg by mouth every 6 (six) hours as needed for pain.      [name] albuterol (PROAIR HFA) 108 (90 BASE) MCG/ACT inhaler Inhale 2 puffs into the lungs every 6 (six) hours as needed for wheezing or shortness of breath.  1 Inhaler  5  . cholestyramine (QUESTRAN) 4 G packet Take 1 packet by mouth as needed.      . clonazePAM (KLONOPIN) 0.5 MG tablet Take 0.5 mg by mouth 3 (three) times daily as needed.       . clotrimazole (MYCELEX) 10 MG troche Take 1 tablet (10 mg total) by mouth 4 (four) times daily as needed.  12 tablet  0  . escitalopram (LEXAPRO) 20 MG tablet Take 20 mg by mouth daily.       [name] esomeprazole (NEXIUM) 40 MG capsule Take 40 mg by mouth daily at 12 noon.      . Fluticasone Furoate-Vilanterol (BREO ELLIPTA) 100-25 MCG/INH AEPB Inhale 1 puff into the lungs daily.  28 each  1  . Fluticasone-Salmeterol (ADVAIR DISKUS) 250-50 MCG/DOSE AEPB Inhale 1 puff into the lungs 2 (two) times daily.  1 each  5  . ORENCIA 125 MG/ML SOLN Inject 1 each into the skin once a week.       . predniSONE  (DELTASONE) 5 MG tablet Take 5 mg by mouth as needed.      . Rivaroxaban (XARELTO) 20 MG TABS tablet Take 20 mg by mouth daily with supper.      . sotalol (BETAPACE) 120 MG tablet Take 120 mg by mouth 2 (two) times daily.       [name] tiotropium (SPIRIVA HANDIHALER) 18 MCG inhalation capsule Place 1 capsule (18 mcg total) into inhaler and inhale daily.  30 capsule  2   No current facility-administered medications on file prior to visit.     Review of Systems Positive for palpable mass on left breast. Left breast larger than the right breast Negative for nipple discharge, inversion, skin changes All other systems negative    Objective:  BP 128/64  Pulse 71  Temp(Src) 98.2 F (36.8 C) (Oral)  Resp 14  Ht 5' 6.5" (1.689 m)  Wt 209 lb (94.802 kg)  BMI 33.23 kg/m2  SpO2 96%   Physical Exam  Pulmonary/Chest: Right breast exhibits no inverted nipple, no mass, no nipple discharge, no skin change and no tenderness. Left breast exhibits mass  and tenderness. Left breast exhibits no inverted nipple, no nipple discharge and no skin change. Breasts are asymmetrical.         Assessment & Plan:   1. Breast nodule Palpable at 6 o'clock left breast. She has not had mammogram in 2 years. Scheduled mammogram for June 4 at [facility]. - MM Digital Diagnostic Bilat; Future - US Breast Bilateral; Future

## 2014-05-26 NOTE — Progress Notes (Signed)
Pre visit review using our clinic review tool, if applicable. No additional management support is needed unless otherwise documented below in the visit note. 

## 2014-05-26 NOTE — Telephone Encounter (Signed)
Called and spoke with pt and she stated that she has been using the spiriva and the breo in the morning and by 2 pm during the day she is out of breath and has to use her rescue inhaler.  Pt wanted to see if BQ feels that the spiriva respimat would work better for her.  Pt stated that she would really love to try the anoro, but will do what BQ feels is best for her.  Pt stated that she has tried advair in the past.   BQ please advise. Thanks  Allergies  Allergen Reactions  . Codeine Nausea Only  . Latex      Current Outpatient Prescriptions on File Prior to Visit  Medication Sig Dispense Refill  . acetaminophen (TYLENOL) 500 MG tablet Take 500 mg by mouth every 6 (six) hours as needed for pain.      [name] albuterol (PROAIR HFA) 108 (90 BASE) MCG/ACT inhaler Inhale 2 puffs into the lungs every 6 (six) hours as needed for wheezing or shortness of breath.  1 Inhaler  5  . cholestyramine (QUESTRAN) 4 G packet Take 1 packet by mouth as needed.      . clonazePAM (KLONOPIN) 0.5 MG tablet Take 0.5 mg by mouth 3 (three) times daily as needed.       . clotrimazole (MYCELEX) 10 MG troche Take 1 tablet (10 mg total) by mouth 4 (four) times daily as needed.  12 tablet  0  . escitalopram (LEXAPRO) 20 MG tablet Take 20 mg by mouth daily.       [name] esomeprazole (NEXIUM) 40 MG capsule Take 40 mg by mouth daily at 12 noon.      . Fluticasone Furoate-Vilanterol (BREO ELLIPTA) 100-25 MCG/INH AEPB Inhale 1 puff into the lungs daily.  28 each  1  . Fluticasone-Salmeterol (ADVAIR DISKUS) 250-50 MCG/DOSE AEPB Inhale 1 puff into the lungs 2 (two) times daily.  1 each  5  . ORENCIA 125 MG/ML SOLN Inject 1 each into the skin once a week.       . predniSONE (DELTASONE) 5 MG tablet Take 5 mg by mouth as needed.      . Rivaroxaban (XARELTO) 20 MG TABS tablet Take 20 mg by mouth daily with supper.      . sotalol (BETAPACE) 120 MG tablet Take 120 mg by mouth 2 (two) times daily.       [name] tiotropium (SPIRIVA HANDIHALER) 18 MCG  inhalation capsule Place 1 capsule (18 mcg total) into inhaler and inhale daily.  30 capsule  2   No current facility-administered medications on file prior to visit.

## 2014-05-27 ENCOUNTER — Telehealth: Payer: Self-pay | Admitting: Adult Health

## 2014-05-27 ENCOUNTER — Other Ambulatory Visit: Payer: Self-pay | Admitting: Adult Health

## 2014-05-27 DIAGNOSIS — J449 Chronic obstructive pulmonary disease, unspecified: Secondary | ICD-10-CM

## 2014-05-27 NOTE — Telephone Encounter (Signed)
Notified patient that a referral will be place for 2nd opinion per Charolette Forward NP.

## 2014-05-27 NOTE — Telephone Encounter (Signed)
Spoke with patient--aware of recs per BQ last phone note  Brandy Doom, MD at 05/17/2014 10:20 PM     Status: Signed        She has changed medicines so much that I am thoroughly confused.  Tell her to take Spiriva and Breo once daily at 9:00 AM sharp. Stick with this for a month and then report back. Exercise regularly during that month. If she gets worse (increasing dyspnea) then she needs to see [place] in clinic.  Switching to Anoro would mean that she would actually have to stop taking an inhaled corticosteroid and would just confuse things   Explained to patient again the technique of breathing in the inhalers when taking them and instructed her of rinsing mouth after each use. Pt stated that her throat has seemed a little irritated recently and she was wondering if it could be the inhaler causing this. Instructed patient to gargle after each use to ensure that she is not leaving medication on the back of her throat for prolonged periods of time. Pt states that she will give this a try and let us know if the irritation goes away.   Pt expressed understanding. Will contact our office in a few weeks to let us know how she is tolerating the Spiriva and Breo.  Will send to Dr Lake Bells as Juluis Rainier.

## 2014-05-27 NOTE — Telephone Encounter (Signed)
Pt asking if she can get referral for Pulmonary in [facility] area.  States she does not have a specific one in mind.

## 2014-05-27 NOTE — Telephone Encounter (Signed)
Stick with previous plan per my phone note  Doubt spiriva respimat will make a difference

## 2014-06-02 ENCOUNTER — Ambulatory Visit: Payer: Self-pay | Admitting: Adult Health

## 2014-06-02 LAB — HM MAMMOGRAPHY: HM MAMMO: NORMAL

## 2014-06-21 ENCOUNTER — Encounter: Payer: Self-pay | Admitting: Adult Health

## 2014-06-21 ENCOUNTER — Other Ambulatory Visit: Payer: Self-pay

## 2014-06-21 MED ORDER — TIOTROPIUM BROMIDE MONOHYDRATE 18 MCG IN CAPS: 18.0000 ug | ORAL_CAPSULE | Freq: Every day | RESPIRATORY_TRACT | Status: DC

## 2014-08-09 ENCOUNTER — Ambulatory Visit (INDEPENDENT_AMBULATORY_CARE_PROVIDER_SITE_OTHER): Payer: Medicare Other | Admitting: Adult Health

## 2014-08-09 ENCOUNTER — Encounter: Payer: Self-pay | Admitting: Adult Health

## 2014-08-09 VITALS — BP 120/78 | HR 81 | Temp 98.2°F | Resp 14 | Wt 211.5 lb

## 2014-08-09 DIAGNOSIS — R5381 Other malaise: Secondary | ICD-10-CM

## 2014-08-09 DIAGNOSIS — R5383 Other fatigue: Secondary | ICD-10-CM

## 2014-08-09 DIAGNOSIS — R739 Hyperglycemia, unspecified: Secondary | ICD-10-CM

## 2014-08-09 DIAGNOSIS — R7309 Other abnormal glucose: Secondary | ICD-10-CM

## 2014-08-09 DIAGNOSIS — Z23 Encounter for immunization: Secondary | ICD-10-CM

## 2014-08-09 LAB — CBC WITH DIFFERENTIAL/PLATELET
BASOS ABS: 0.1 10*3/uL (ref 0.0–0.1)
BASOS PCT: 0.6 % (ref 0.0–3.0)
EOS ABS: 0.2 10*3/uL (ref 0.0–0.7)
Eosinophils Relative: 1.8 % (ref 0.0–5.0)
HCT: 34.8 % — ABNORMAL LOW (ref 36.0–46.0)
Hemoglobin: 11.1 g/dL — ABNORMAL LOW (ref 12.0–15.0)
Lymphocytes Relative: 33.8 % (ref 12.0–46.0)
Lymphs Abs: 3.1 10*3/uL (ref 0.7–4.0)
MCHC: 31.9 g/dL (ref 30.0–36.0)
MCV: 72.7 fl — AB (ref 78.0–100.0)
MONO ABS: 0.5 10*3/uL (ref 0.1–1.0)
Monocytes Relative: 5.7 % (ref 3.0–12.0)
NEUTROS PCT: 58.1 % (ref 43.0–77.0)
Neutro Abs: 5.4 10*3/uL (ref 1.4–7.7)
PLATELETS: 276 10*3/uL (ref 150.0–400.0)
RBC: 4.79 Mil/uL (ref 3.87–5.11)
RDW: 17.7 % — AB (ref 11.5–15.5)
WBC: 9.3 10*3/uL (ref 4.0–10.5)

## 2014-08-09 LAB — HEMOGLOBIN A1C: Hgb A1c MFr Bld: 6.1 % (ref 4.6–6.5)

## 2014-08-09 MED ORDER — CLONAZEPAM 0.5 MG PO TABS: 0.5000 mg | ORAL_TABLET | Freq: Three times a day (TID) | ORAL | Status: DC | PRN

## 2014-08-09 MED ORDER — ESCITALOPRAM OXALATE 20 MG PO TABS: 20.0000 mg | ORAL_TABLET | Freq: Every day | ORAL | Status: DC

## 2014-08-09 NOTE — Progress Notes (Signed)
Pre visit review using our clinic review tool, if applicable. No additional management support is needed unless otherwise documented below in the visit note. 

## 2014-08-09 NOTE — Progress Notes (Signed)
Patient ID: Brandy Armstrong, female   DOB: [birth year], 68 y.o.   MRN: [id number]    Subjective:    Patient ID: Brandy Armstrong, female    DOB: 1946/09/20, 68 y.o.   MRN: [id number]  HPI  Pt is a pleasant 68 y/o female who presents to clinic for medication refills. She was followed by Psychiatry for people who do not have insurance and was on clonazepam tid and lexapro daily. She now has insurance and they are not able to see her. She is out of her clonazepam and almost out of lexapro.  Needs tetanus vaccine.  Has had elevated blood glucose in the past and will need an A1c today.  Pt reports that she was seen by Pulmonology at [facility] yesterday for her COPD. She was establishing care.  Also, followed by Electrophysiologist at [facility] and also local cardiologist, Dr. Fletcher Anon. Pt with hx of afib. She is c/o fatigue. She believes this may be d/t her COPD.   Past Medical History  Diagnosis Date  . Rheumatoid arthritis     On methotrexate and orencia  . GERD (gastroesophageal reflux disease)   . Atrial fibrillation     s/p ablation 01/2012 in [facility] by Dr. Boyd Kerbs  . Urinary incontinence   . Colon polyps   . Depression with anxiety   . Barretts esophagus   . Rectal fistula   . Vitamin D deficiency   . A-fib     Current Outpatient Prescriptions on File Prior to Visit  Medication Sig Dispense Refill  . albuterol (PROAIR HFA) 108 (90 BASE) MCG/ACT inhaler Inhale 2 puffs into the lungs every 6 (six) hours as needed for wheezing or shortness of breath.  1 Inhaler  5  . cholestyramine (QUESTRAN) 4 G packet Take 1 packet by mouth as needed.      [name] esomeprazole (NEXIUM) 40 MG capsule Take 40 mg by mouth daily at 12 noon.      [name] ORENCIA 125 MG/ML SOLN Inject 1 each into the skin once a week.       . predniSONE (DELTASONE) 5 MG tablet Take 5 mg by mouth as needed.      . Rivaroxaban (XARELTO) 20 MG TABS tablet Take 20 mg by mouth daily with supper.      . sotalol (BETAPACE) 120 MG tablet Take 120 mg by  mouth 2 (two) times daily.       . clotrimazole (MYCELEX) 10 MG troche Take 1 tablet (10 mg total) by mouth 4 (four) times daily as needed.  12 tablet  0  . Fluticasone Furoate-Vilanterol (BREO ELLIPTA) 100-25 MCG/INH AEPB Inhale 1 puff into the lungs daily.  28 each  1  . Fluticasone-Salmeterol (ADVAIR DISKUS) 250-50 MCG/DOSE AEPB Inhale 1 puff into the lungs 2 (two) times daily.  1 each  5  . tiotropium (SPIRIVA HANDIHALER) 18 MCG inhalation capsule Place 1 capsule (18 mcg total) into inhaler and inhale daily.  30 capsule  6   No current facility-administered medications on file prior to visit.     Review of Systems  Constitutional: Positive for fatigue.  HENT: Negative.   Eyes: Negative.   Respiratory: Positive for shortness of breath (hx of fatigue).   Cardiovascular: Negative.   Gastrointestinal: Negative.   Endocrine: Negative.   Genitourinary: Negative.   Musculoskeletal: Negative.   Skin:       Followed by dermatology for evaluation of nevi, seborrheic keratosis. Derm has sent biopsies  Allergic/Immunologic: Negative.   Neurological: Negative.  Hematological: Negative.   Psychiatric/Behavioral: Negative.        Objective:  BP 120/78  Pulse 81  Temp(Src) 98.2 F (36.8 C) (Oral)  Resp 14  Wt 211 lb 8 oz (95.936 kg)  SpO2 97%   Physical Exam  Constitutional: She is oriented to person, place, and time. No distress.  HENT:  Head: Normocephalic and atraumatic.  Eyes: Conjunctivae and EOM are normal.  Neck: Normal range of motion. Neck supple.  Cardiovascular: Normal rate, normal heart sounds and intact distal pulses.  Exam reveals no gallop and no friction rub.   No murmur heard. Irregular rhythm  Pulmonary/Chest: Effort normal and breath sounds normal. No respiratory distress. She has no wheezes. She has no rales.  Musculoskeletal: Normal range of motion.  Neurological: She is alert and oriented to person, place, and time. She has normal reflexes. Coordination  normal.  Skin: Skin is warm and dry.  Psychiatric: She has a normal mood and affect. Her behavior is normal. Judgment and thought content normal.      Assessment & Plan:   1. Blood glucose elevated Check for diabetes given several elevated blood glucose and other risk factors such as obesity - HgB A1c  2. Other fatigue Suspect this is 2/2 afib. She is on xarelto and also takes sotalol. She needs to schedule appt with Electrophysiologist at [facility] who has sent her letter to call for f/u appt. I will check her CBC to r/o anemia as a contributing factor.  - CBC with Differential

## 2014-08-09 NOTE — Addendum Note (Signed)
Addended by: Geni Bers on: 08/09/2014 03:04 PM   Modules accepted: Orders

## 2014-08-09 NOTE — Patient Instructions (Signed)
  You received you tetanus vaccine today. This will be good for 10 years.  Please call your cardiologist at [facility] and schedule an appointment for followup of atrial fibrillation.  I am checking your A1c today. You have had elevated blood glucose levels in the past and I am checking for diabetes.  I will also check a CBC for your concerns of fatigue. Atrial fibrillation can also contribute to fatigue, so once again please followup with your cardiologist.

## 2014-08-10 ENCOUNTER — Encounter: Payer: Self-pay | Admitting: Adult Health

## 2014-08-12 ENCOUNTER — Inpatient Hospital Stay: Payer: Self-pay | Admitting: Internal Medicine

## 2014-08-12 LAB — CBC
HCT: 37 % (ref 35.0–47.0)
HGB: 11.2 g/dL — ABNORMAL LOW (ref 12.0–16.0)
MCH: 22.8 pg — ABNORMAL LOW (ref 26.0–34.0)
MCHC: 30.3 g/dL — ABNORMAL LOW (ref 32.0–36.0)
MCV: 76 fL — ABNORMAL LOW (ref 80–100)
Platelet: 237 10*3/uL (ref 150–440)
RBC: 4.9 10*6/uL (ref 3.80–5.20)
RDW: 16.9 % — AB (ref 11.5–14.5)
WBC: 10 10*3/uL (ref 3.6–11.0)

## 2014-08-12 LAB — COMPREHENSIVE METABOLIC PANEL
ALBUMIN: 2.9 g/dL — AB (ref 3.4–5.0)
ALK PHOS: 124 U/L — AB
Anion Gap: 13 (ref 7–16)
BUN: 11 mg/dL (ref 7–18)
Bilirubin,Total: 0.3 mg/dL (ref 0.2–1.0)
CALCIUM: 8.3 mg/dL — AB (ref 8.5–10.1)
CREATININE: 0.9 mg/dL (ref 0.60–1.30)
Chloride: 108 mmol/L — ABNORMAL HIGH (ref 98–107)
Co2: 22 mmol/L (ref 21–32)
EGFR (African American): >60
EGFR (Non-African Amer.): >60
Glucose: 143 mg/dL — ABNORMAL HIGH (ref 65–99)
OSMOLALITY: 287 (ref 275–301)
Potassium: 3.4 mmol/L — ABNORMAL LOW (ref 3.5–5.1)
SGOT(AST): 56 U/L — ABNORMAL HIGH (ref 15–37)
SGPT (ALT): 33 U/L
Sodium: 143 mmol/L (ref 136–145)
Total Protein: 7.6 g/dL (ref 6.4–8.2)

## 2014-08-12 LAB — TROPONIN I
Troponin-I: <0.02 ng/mL
Troponin-I: <0.02 ng/mL

## 2014-08-12 LAB — PRO B NATRIURETIC PEPTIDE: B-Type Natriuretic Peptide: 1960 pg/mL — ABNORMAL HIGH (ref 0–125)

## 2014-08-13 DIAGNOSIS — I509 Heart failure, unspecified: Secondary | ICD-10-CM

## 2014-08-13 DIAGNOSIS — J449 Chronic obstructive pulmonary disease, unspecified: Secondary | ICD-10-CM

## 2014-08-13 DIAGNOSIS — I5033 Acute on chronic diastolic (congestive) heart failure: Secondary | ICD-10-CM

## 2014-08-13 DIAGNOSIS — I4891 Unspecified atrial fibrillation: Secondary | ICD-10-CM

## 2014-08-13 LAB — BASIC METABOLIC PANEL
ANION GAP: 10 (ref 7–16)
BUN: 9 mg/dL (ref 7–18)
CREATININE: 0.87 mg/dL (ref 0.60–1.30)
Calcium, Total: 8 mg/dL — ABNORMAL LOW (ref 8.5–10.1)
Chloride: 108 mmol/L — ABNORMAL HIGH (ref 98–107)
Co2: 25 mmol/L (ref 21–32)
GLUCOSE: 99 mg/dL (ref 65–99)
Osmolality: 284 (ref 275–301)
Potassium: 3.9 mmol/L (ref 3.5–5.1)
SODIUM: 143 mmol/L (ref 136–145)

## 2014-08-13 LAB — TROPONIN I: Troponin-I: <0.02 ng/mL

## 2014-08-15 ENCOUNTER — Telehealth: Payer: Self-pay

## 2014-08-15 NOTE — Telephone Encounter (Signed)
Spoke w/ pt.  She asks that we call her ins co to get her inhalers approved for coverage.  Advised her to contact her pulmonologist for this.  She is agreeable.  Offered pt appt to come in for hospital f/u w/ Dr. Fletcher Anon, but she states that she does not want to see him again.

## 2014-08-15 NOTE — Telephone Encounter (Signed)
Pt states she saw Dr. Rockey Situ this past weekend, and prescribed a medication, not sure the name, an alternative to an inhaler.

## 2014-08-17 ENCOUNTER — Telehealth: Payer: Self-pay

## 2014-08-17 NOTE — Telephone Encounter (Signed)
Spoke with patient that the Xopenex HFA has been approved for a non-formulary exception through 12/29/2014. The patient will take her Rx to [facility] in [place].

## 2014-08-25 ENCOUNTER — Encounter: Payer: Medicare Other | Admitting: Nurse Practitioner

## 2014-08-29 ENCOUNTER — Telehealth: Payer: Self-pay

## 2014-08-29 ENCOUNTER — Emergency Department: Payer: Self-pay | Admitting: Emergency Medicine

## 2014-08-29 LAB — CBC
HCT: 34.9 % — ABNORMAL LOW (ref 35.0–47.0)
HGB: 10.6 g/dL — ABNORMAL LOW (ref 12.0–16.0)
MCH: 22.8 pg — ABNORMAL LOW (ref 26.0–34.0)
MCHC: 30.5 g/dL — AB (ref 32.0–36.0)
MCV: 75 fL — ABNORMAL LOW (ref 80–100)
Platelet: 256 10*3/uL (ref 150–440)
RBC: 4.66 10*6/uL (ref 3.80–5.20)
RDW: 17.1 % — AB (ref 11.5–14.5)
WBC: 7.8 10*3/uL (ref 3.6–11.0)

## 2014-08-29 LAB — COMPREHENSIVE METABOLIC PANEL
ALK PHOS: 115 U/L
Albumin: 2.6 g/dL — ABNORMAL LOW (ref 3.4–5.0)
Anion Gap: 8 (ref 7–16)
BUN: 7 mg/dL (ref 7–18)
Bilirubin,Total: 0.3 mg/dL (ref 0.2–1.0)
CO2: 25 mmol/L (ref 21–32)
CREATININE: 0.97 mg/dL (ref 0.60–1.30)
Calcium, Total: 8.9 mg/dL (ref 8.5–10.1)
Chloride: 105 mmol/L (ref 98–107)
EGFR (Non-African Amer.): >60
Glucose: 149 mg/dL — ABNORMAL HIGH (ref 65–99)
Osmolality: 276 (ref 275–301)
Potassium: 3.4 mmol/L — ABNORMAL LOW (ref 3.5–5.1)
SGOT(AST): 37 U/L (ref 15–37)
SGPT (ALT): 18 U/L
SODIUM: 138 mmol/L (ref 136–145)
Total Protein: 7.2 g/dL (ref 6.4–8.2)

## 2014-08-29 LAB — TROPONIN I

## 2014-08-29 NOTE — Telephone Encounter (Signed)
Spoke w/ pt.  She reports that she was recently hospitalized for afib and feels that she is back out of rhythm.  Reports that her HR is too fast to count.  She states that she has taken her cardizem and is keeping her stethoscope on her chest in hopes that she will convert back to NSR.  Advised pt to call EMS and have them perform and EKG and if necessary, administer meds and/or take her to ED. She is not agreeable, as she states that she is terrified of the ambulance.  Pt states that she will call a friend to take her to the ED. Asked her to call back if we can be of further assistance.

## 2014-09-01 ENCOUNTER — Ambulatory Visit (INDEPENDENT_AMBULATORY_CARE_PROVIDER_SITE_OTHER): Payer: Medicare Other | Admitting: Nurse Practitioner

## 2014-09-01 ENCOUNTER — Encounter: Payer: Self-pay | Admitting: Nurse Practitioner

## 2014-09-01 VITALS — BP 118/64 | HR 74 | Ht 66.0 in | Wt 215.5 lb

## 2014-09-01 DIAGNOSIS — I480 Paroxysmal atrial fibrillation: Secondary | ICD-10-CM

## 2014-09-01 DIAGNOSIS — I4891 Unspecified atrial fibrillation: Secondary | ICD-10-CM

## 2014-09-01 NOTE — Progress Notes (Signed)
Patient Name: Brandy Armstrong Date of Encounter: 09/01/2014  Primary Care Provider:  Charolette Forward, NP Primary Cardiologist:  Johnny Bridge, MD  Electrophysiologist: Dr. Radene Knee @ [facility]  Patient Profile  68 y/o female with a h/o PAF who presents for f/u.  Problem List   Past Medical History  Diagnosis Date  . Rheumatoid arthritis     On methotrexate and orencia  . GERD (gastroesophageal reflux disease)   . Atrial fibrillation     a. s/p ablation 01/2012 in [facility] by Dr. Boyd Kerbs;  b. On sotalol & Xarelto;  c. 02/2014 Echo: EF 50-55%, mild conc LVH, nl LA size/structure;  d. Recurrent afib 8/15 & 08/29/2014.  [name] Urinary incontinence   . Colon polyps   . Depression with anxiety   . Barretts esophagus   . Rectal fistula   . Vitamin D deficiency   . Chest pain     a. 02/2014 Myoview: Ef 50%, no ischemia.   Past Surgical History  Procedure Laterality Date  . Cholecystectomy  1987  . Abdominal hysterectomy  1992  . Oophrectomy Bilateral 1992  . Cardiac electrophysiology study and ablation  2013    Allergies  Allergies  Allergen Reactions  . Codeine Nausea Only  . Latex     HPI  68 y/o female with the above problem list.  She was admitted to [facility] in mid-August with recurrent afib. She was placed on dilt and converted to sinus rhythm and subsequently discharged on sotalol 120mg  bid along with dilt 30mg  bid.  Following that discharge, she notes frequent paroxysms of irregular heart beats and occasional tachypalpitations.  She had recurrent tachypalps on 8/31 and took her diltiazem as scheduled.  She drove herself to the [facility] ED and by the time she got there, she says that her rate was well controlled but she thinks she was still in afib.  Labs and cxr were unrevealing and she was discharged from the ED.  Since then, she has continued to note irregular heart beats, and thought she was in afib, but is in sinus rhythm with occas PAC's today.  She does think that overall, palpitations have  improved some since starting on diltiazem.  She has chronic DOE.  She does not always take her lasix as prescribed b/c she says that it makes her feel like she has to go but when she sits down to use the toilet, not much comes out.  She denies fever/chills but has occasionally noted dysuria.  She does not wish to provide a UA today.  She denies chest pain, pnd, orthopnea, n, v, dizziness, syncope, edema, or early satiety.  She thinks that she has been putting on weight but can't say how much.  Home Medications  Prior to Admission medications   Medication Sig Start Date End Date Taking? Authorizing Provider  budesonide-formoterol (SYMBICORT) 160-4.5 MCG/ACT inhaler Inhale 2 puffs into the lungs 2 (two) times daily.   Yes Historical Provider, MD  cholestyramine Lucrezia Starch) 4 G packet Take 1 packet by mouth as needed.   Yes Historical Provider, MD  clonazePAM (KLONOPIN) 0.5 MG tablet Take 1 tablet (0.5 mg total) by mouth 3 (three) times daily as needed. 08/09/14  Yes Raquel Dagoberto Ligas, NP  clotrimazole (MYCELEX) 10 MG troche Take 1 tablet (10 mg total) by mouth 4 (four) times daily as needed. 04/29/14  Yes Tanda Rockers, MD  diltiazem (CARDIZEM) 30 MG tablet Take 30 mg by mouth 2 (two) times daily.   Yes Historical Provider, MD  escitalopram (LEXAPRO) 20 MG tablet Take 1 tablet (20 mg total) by mouth daily. 08/09/14  Yes Raquel Dagoberto Ligas, NP  esomeprazole (NEXIUM) 40 MG capsule Take 40 mg by mouth daily at 12 noon.   Yes Historical Provider, MD  furosemide (LASIX) 20 MG tablet Take 20 mg by mouth daily.   Yes Historical Provider, MD  levalbuterol [facility] HFA) 45 MCG/ACT inhaler Inhale 2 puffs into the lungs every 4 (four) hours as needed for wheezing.   Yes Historical Provider, MD  ORENCIA 125 MG/ML SOLN Inject 1 each into the skin once a week.  08/10/13  Yes Historical Provider, MD  potassium chloride (K-DUR) 10 MEQ tablet Take 10 mEq by mouth daily.   Yes Historical Provider, MD  predniSONE (DELTASONE) 5 MG  tablet Take 5 mg by mouth as needed.   Yes Historical Provider, MD  Rivaroxaban (XARELTO) 20 MG TABS tablet Take 20 mg by mouth daily with supper.   Yes Historical Provider, MD  sotalol (BETAPACE) 120 MG tablet Take 120 mg by mouth 2 (two) times daily.  08/10/13  Yes Historical Provider, MD  Tiotropium Bromide Monohydrate (SPIRIVA RESPIMAT) 2.5 MCG/ACT AERS Inhale into the lungs daily.   Yes Historical Provider, MD    Review of Systems  Frequent irregular palpitations with occasional tachypalps as noted above.  Chronic DOE.  Urinary hesitancy w/ occasional dysuria.  All other systems reviewed and are otherwise negative except as noted above.  Physical Exam  Blood pressure 118/64, pulse 74, height 5\' 6"  (1.676 m), weight 215 lb 8 oz (97.75 kg).  General: Pleasant, NAD Psych: Normal affect. Neuro: Alert and oriented X 3. Moves all extremities spontaneously. HEENT: Normal  Neck: Supple without bruits or JVD. Lungs:  Resp regular and unlabored, CTA. Heart: RRR no s3, s4, or murmurs. Abdomen: Soft, non-tender, non-distended, BS + x 4.  Extremities: No clubbing, cyanosis or edema. DP/PT/Radials 2+ and equal bilaterally.  Accessory Clinical Findings  ECG - RSR, PAC, 75, no acute ST/T changes.  Assessment & Plan  1.  PAF:  Pt is s/p admission in mid-August 2/2 PAF that converted spontaneously following the addition of diltiazem on top of baseline sotalol therapy.  She is chronically anticoagulated with Xarelto.  She reports recurrent paroxysms of afib since discharge and was seen in the ED on 8/31.  She was not aware that she converted back to sinus rhythm since that visit.  She feels that the addition of dilt has helped.  She has EP f/u @ [facility] scheduled for early October and I will defer any changes to her regimen to EP.  Her LA was nl in size by echo in March, and it may be that she would benefit from a touch up ablation.  2.  Urinary hesitancy:  She says that she has stopped taking her lasix  on a regular basis b/c she feels like it doesn't work as well as it should.  She often feels like she has to go but then very little comes out.  This sounds more like a primary urinary issue to me than poor response to lasix.  I offered her a UA today as she also c/o occasional dysuria, but she wished to defer.  I recommended primary care f/u and she may benefit from seeing urology.  3.  Dispo:  F/U with EP in October @ [facility].    [name], NP 09/01/2014, 2:58 PM

## 2014-09-01 NOTE — Patient Instructions (Addendum)
Your physician recommends that you continue on your current medications as directed. Please refer to the Current Medication list given to you today.  Please keep your appointment with your Electrophysiologist at [facility] as scheduled  Your physician recommends that you schedule a follow-up appointment in: 6 months w/ Dr. Fletcher Anon

## 2014-10-05 ENCOUNTER — Other Ambulatory Visit: Payer: Self-pay | Admitting: *Deleted

## 2014-10-05 MED ORDER — DILTIAZEM HCL 30 MG PO TABS: 30.0000 mg | ORAL_TABLET | Freq: Two times a day (BID) | ORAL | Status: DC

## 2014-10-27 ENCOUNTER — Telehealth: Payer: Self-pay

## 2014-10-27 MED ORDER — ESOMEPRAZOLE MAGNESIUM 40 MG PO CPDR: 40.0000 mg | DELAYED_RELEASE_CAPSULE | Freq: Every day | ORAL | Status: DC

## 2014-10-27 NOTE — Telephone Encounter (Signed)
Former [employer] Patient called requesting a refill on nexium already sent to pharmacy. Patient also requested a refill on her clonazepam, she has 5 tablets left. Patient was last seen by Raquel on 08/09/14 and has office visit scheduled with Dr. Kavin Leech on 12/08/14. Please advise if ok to refill clonazepam Rx until appt with Dr. Nicki Reaper.

## 2014-10-28 MED ORDER — CLONAZEPAM 0.5 MG PO TABS: 0.5000 mg | ORAL_TABLET | Freq: Three times a day (TID) | ORAL | Status: DC | PRN

## 2014-10-28 NOTE — Telephone Encounter (Signed)
In reviewing Brandy Armstrong's last note, she had previously been followed by psychiatry.  They had been prescribing this medication until the last visit with Brandy Armstrong.  Brandy Armstrong refilled last visit.  Since has been on this medication, I will refill the clonazepam x 1.  Given on tid dosing of clonazepam, I do recommend her reestablish with a psychiatrist.  They will need to start prescribing this medication and will need to follow her.

## 2014-10-28 NOTE — Telephone Encounter (Signed)
Rx printed as ordered. Rx called into patient's preferred pharmacy. Patient notified of Dr. Bary Leriche comments as well. Patient verbalized understanding.

## 2014-12-08 ENCOUNTER — Ambulatory Visit: Payer: Medicare Other | Admitting: Internal Medicine

## 2015-01-02 ENCOUNTER — Inpatient Hospital Stay: Payer: Self-pay | Admitting: Internal Medicine

## 2015-01-02 DIAGNOSIS — J449 Chronic obstructive pulmonary disease, unspecified: Secondary | ICD-10-CM | POA: Diagnosis not present

## 2015-01-02 DIAGNOSIS — I5020 Unspecified systolic (congestive) heart failure: Secondary | ICD-10-CM | POA: Diagnosis not present

## 2015-01-02 DIAGNOSIS — D649 Anemia, unspecified: Secondary | ICD-10-CM | POA: Diagnosis not present

## 2015-01-02 DIAGNOSIS — I4892 Unspecified atrial flutter: Secondary | ICD-10-CM | POA: Diagnosis not present

## 2015-01-02 DIAGNOSIS — J410 Simple chronic bronchitis: Secondary | ICD-10-CM | POA: Diagnosis not present

## 2015-01-02 DIAGNOSIS — M0579 Rheumatoid arthritis with rheumatoid factor of multiple sites without organ or systems involvement: Secondary | ICD-10-CM | POA: Diagnosis not present

## 2015-01-02 DIAGNOSIS — R002 Palpitations: Secondary | ICD-10-CM | POA: Diagnosis not present

## 2015-01-02 DIAGNOSIS — F329 Major depressive disorder, single episode, unspecified: Secondary | ICD-10-CM | POA: Diagnosis not present

## 2015-01-02 DIAGNOSIS — I952 Hypotension due to drugs: Secondary | ICD-10-CM | POA: Diagnosis not present

## 2015-01-02 DIAGNOSIS — I4891 Unspecified atrial fibrillation: Secondary | ICD-10-CM | POA: Diagnosis not present

## 2015-01-02 DIAGNOSIS — F419 Anxiety disorder, unspecified: Secondary | ICD-10-CM | POA: Diagnosis not present

## 2015-01-02 DIAGNOSIS — K219 Gastro-esophageal reflux disease without esophagitis: Secondary | ICD-10-CM | POA: Diagnosis not present

## 2015-01-02 DIAGNOSIS — Z87891 Personal history of nicotine dependence: Secondary | ICD-10-CM | POA: Diagnosis not present

## 2015-01-02 DIAGNOSIS — M069 Rheumatoid arthritis, unspecified: Secondary | ICD-10-CM | POA: Diagnosis not present

## 2015-01-02 LAB — CBC
HCT: 34 % — AB (ref 35.0–47.0)
HGB: 10.2 g/dL — AB (ref 12.0–16.0)
MCH: 21.9 pg — ABNORMAL LOW (ref 26.0–34.0)
MCHC: 30.1 g/dL — AB (ref 32.0–36.0)
MCV: 73 fL — ABNORMAL LOW (ref 80–100)
Platelet: 224 10*3/uL (ref 150–440)
RBC: 4.67 10*6/uL (ref 3.80–5.20)
RDW: 17.5 % — ABNORMAL HIGH (ref 11.5–14.5)
WBC: 9.6 10*3/uL (ref 3.6–11.0)

## 2015-01-02 LAB — PRO B NATRIURETIC PEPTIDE: B-TYPE NATIURETIC PEPTID: 1457 pg/mL — AB (ref 0–125)

## 2015-01-02 LAB — BASIC METABOLIC PANEL
Anion Gap: 10 (ref 7–16)
BUN: 12 mg/dL (ref 7–18)
Calcium, Total: 8.8 mg/dL (ref 8.5–10.1)
Chloride: 102 mmol/L (ref 98–107)
Co2: 23 mmol/L (ref 21–32)
Creatinine: 0.93 mg/dL (ref 0.60–1.30)
EGFR (African American): >60
EGFR (Non-African Amer.): >60
Glucose: 128 mg/dL — ABNORMAL HIGH (ref 65–99)
Osmolality: 271 (ref 275–301)
Potassium: 3.6 mmol/L (ref 3.5–5.1)
Sodium: 135 mmol/L — ABNORMAL LOW (ref 136–145)

## 2015-01-02 LAB — PROTIME-INR
INR: 1.6
Prothrombin Time: 19.1 secs — ABNORMAL HIGH (ref 11.5–14.7)

## 2015-01-02 LAB — TROPONIN I

## 2015-01-03 DIAGNOSIS — J449 Chronic obstructive pulmonary disease, unspecified: Secondary | ICD-10-CM

## 2015-01-03 DIAGNOSIS — I4891 Unspecified atrial fibrillation: Secondary | ICD-10-CM

## 2015-01-03 LAB — BASIC METABOLIC PANEL
ANION GAP: 7 (ref 7–16)
BUN: 10 mg/dL (ref 7–18)
CALCIUM: 8.1 mg/dL — AB (ref 8.5–10.1)
CO2: 24 mmol/L (ref 21–32)
Chloride: 107 mmol/L (ref 98–107)
Creatinine: 0.84 mg/dL (ref 0.60–1.30)
EGFR (African American): >60
EGFR (Non-African Amer.): >60
Glucose: 82 mg/dL (ref 65–99)
Osmolality: 274 (ref 275–301)
Potassium: 3.9 mmol/L (ref 3.5–5.1)
Sodium: 138 mmol/L (ref 136–145)

## 2015-01-03 LAB — CBC WITH DIFFERENTIAL/PLATELET
Basophil #: 0 10*3/uL (ref 0.0–0.1)
Basophil %: 0.4 %
EOS PCT: 1.8 %
Eosinophil #: 0.2 10*3/uL (ref 0.0–0.7)
HCT: 30.3 % — ABNORMAL LOW (ref 35.0–47.0)
HGB: 9.4 g/dL — ABNORMAL LOW (ref 12.0–16.0)
Lymphocyte #: 2.6 10*3/uL (ref 1.0–3.6)
Lymphocyte %: 29.2 %
MCH: 22.3 pg — ABNORMAL LOW (ref 26.0–34.0)
MCHC: 31 g/dL — ABNORMAL LOW (ref 32.0–36.0)
MCV: 72 fL — ABNORMAL LOW (ref 80–100)
Monocyte #: 0.6 x10 3/mm (ref 0.2–0.9)
Monocyte %: 6.7 %
NEUTROS PCT: 61.9 %
Neutrophil #: 5.5 10*3/uL (ref 1.4–6.5)
PLATELETS: 180 10*3/uL (ref 150–440)
RBC: 4.2 10*6/uL (ref 3.80–5.20)
RDW: 17.5 % — ABNORMAL HIGH (ref 11.5–14.5)
WBC: 8.9 10*3/uL (ref 3.6–11.0)

## 2015-01-03 LAB — MAGNESIUM: MAGNESIUM: 1.9 mg/dL

## 2015-01-03 LAB — TSH: Thyroid Stimulating Horm: 1.67 u[IU]/mL

## 2015-01-04 ENCOUNTER — Telehealth: Payer: Self-pay | Admitting: Nurse Practitioner

## 2015-01-04 DIAGNOSIS — I5020 Unspecified systolic (congestive) heart failure: Secondary | ICD-10-CM

## 2015-01-04 DIAGNOSIS — I4892 Unspecified atrial flutter: Secondary | ICD-10-CM | POA: Diagnosis not present

## 2015-01-04 DIAGNOSIS — I4891 Unspecified atrial fibrillation: Secondary | ICD-10-CM | POA: Diagnosis not present

## 2015-01-04 LAB — BASIC METABOLIC PANEL
Anion Gap: 7 (ref 7–16)
BUN: 8 mg/dL (ref 7–18)
CHLORIDE: 106 mmol/L (ref 98–107)
CREATININE: 0.84 mg/dL (ref 0.60–1.30)
Calcium, Total: 8.5 mg/dL (ref 8.5–10.1)
Co2: 26 mmol/L (ref 21–32)
EGFR (African American): >60
EGFR (Non-African Amer.): >60
GLUCOSE: 95 mg/dL (ref 65–99)
Osmolality: 276 (ref 275–301)
POTASSIUM: 3.8 mmol/L (ref 3.5–5.1)
SODIUM: 139 mmol/L (ref 136–145)

## 2015-01-04 LAB — CBC WITH DIFFERENTIAL/PLATELET
BASOS ABS: 0.1 10*3/uL (ref 0.0–0.1)
Basophil %: 0.8 %
EOS ABS: 0.1 10*3/uL (ref 0.0–0.7)
Eosinophil %: 1.3 %
HCT: 30.5 % — ABNORMAL LOW (ref 35.0–47.0)
HGB: 9.4 g/dL — AB (ref 12.0–16.0)
Lymphocyte #: 2.2 10*3/uL (ref 1.0–3.6)
Lymphocyte %: 25.7 %
MCH: 22.3 pg — AB (ref 26.0–34.0)
MCHC: 30.8 g/dL — AB (ref 32.0–36.0)
MCV: 72 fL — ABNORMAL LOW (ref 80–100)
MONO ABS: 0.6 x10 3/mm (ref 0.2–0.9)
Monocyte %: 7 %
NEUTROS PCT: 65.2 %
Neutrophil #: 5.7 10*3/uL (ref 1.4–6.5)
PLATELETS: 176 10*3/uL (ref 150–440)
RBC: 4.21 10*6/uL (ref 3.80–5.20)
RDW: 17.4 % — ABNORMAL HIGH (ref 11.5–14.5)
WBC: 8.7 10*3/uL (ref 3.6–11.0)

## 2015-01-04 LAB — DIGOXIN LEVEL: Digoxin: 0.98 ng/mL

## 2015-01-04 NOTE — Telephone Encounter (Signed)
Patient to be seen for HFU on 01/10/15.

## 2015-01-04 NOTE — Telephone Encounter (Signed)
The patient was scheduled for an AWV on 1.12.16 I changed that visit to a hospital follow up the patient is being discharge today 1.6.16.

## 2015-01-05 ENCOUNTER — Encounter: Payer: Self-pay | Admitting: Physician Assistant

## 2015-01-06 NOTE — Telephone Encounter (Signed)
Left vm for pt to return my call, need to complete the TCM call

## 2015-01-09 ENCOUNTER — Telehealth: Payer: Self-pay | Admitting: *Deleted

## 2015-01-09 DIAGNOSIS — M0579 Rheumatoid arthritis with rheumatoid factor of multiple sites without organ or systems involvement: Secondary | ICD-10-CM | POA: Diagnosis not present

## 2015-01-09 NOTE — Telephone Encounter (Signed)
Discharged: 01/04/15  Transition Care Management Follow-up Telephone Call  How have you been since you were released from the hospital? Pt states "doing okay, feeling weak, today at Rheumatoid Dr appt BP was 107/51"   Do you understand why you were in the hospital? YES   Do you understand the discharge instrcutions? YES  Items Reviewed:  Medications reviewed: YES  Allergies reviewed: NO  Dietary changes reviewed: NO  Referrals reviewed: n/a   Functional Questionnaire:   Activities of Daily Living (ADLs):   She states they are independent in the following:  States they require assistance with the following:    Any transportation issues/concerns?: NO   Any patient concerns? NO   Confirmed importance and date/time of follow-up visits scheduled: YES, NOv 11 at 1:00   Confirmed with patient if condition begins to worsen call PCP or go to the ER.  Patient was given the Call-a-Nurse line [phone]: YES

## 2015-01-10 ENCOUNTER — Other Ambulatory Visit: Payer: Self-pay | Admitting: Nurse Practitioner

## 2015-01-10 ENCOUNTER — Ambulatory Visit (INDEPENDENT_AMBULATORY_CARE_PROVIDER_SITE_OTHER): Payer: Medicare Other | Admitting: Nurse Practitioner

## 2015-01-10 ENCOUNTER — Encounter: Payer: Self-pay | Admitting: Nurse Practitioner

## 2015-01-10 ENCOUNTER — Telehealth: Payer: Self-pay | Admitting: Cardiovascular Disease

## 2015-01-10 VITALS — BP 108/68 | HR 61 | Temp 98.1°F | Resp 14 | Ht 66.0 in | Wt 212.8 lb

## 2015-01-10 DIAGNOSIS — E559 Vitamin D deficiency, unspecified: Secondary | ICD-10-CM

## 2015-01-10 DIAGNOSIS — Z09 Encounter for follow-up examination after completed treatment for conditions other than malignant neoplasm: Secondary | ICD-10-CM | POA: Diagnosis not present

## 2015-01-10 DIAGNOSIS — R7989 Other specified abnormal findings of blood chemistry: Secondary | ICD-10-CM

## 2015-01-10 DIAGNOSIS — R799 Abnormal finding of blood chemistry, unspecified: Secondary | ICD-10-CM

## 2015-01-10 DIAGNOSIS — D509 Iron deficiency anemia, unspecified: Secondary | ICD-10-CM | POA: Diagnosis not present

## 2015-01-10 DIAGNOSIS — E538 Deficiency of other specified B group vitamins: Secondary | ICD-10-CM

## 2015-01-10 LAB — CBC WITH DIFFERENTIAL/PLATELET
BASOS PCT: 0.4 % (ref 0.0–3.0)
Basophils Absolute: 0 10*3/uL (ref 0.0–0.1)
Eosinophils Absolute: 0 10*3/uL (ref 0.0–0.7)
Eosinophils Relative: 0.3 % (ref 0.0–5.0)
HEMATOCRIT: 33.2 % — AB (ref 36.0–46.0)
HEMOGLOBIN: 10.2 g/dL — AB (ref 12.0–15.0)
LYMPHS ABS: 1.6 10*3/uL (ref 0.7–4.0)
Lymphocytes Relative: 17.1 % (ref 12.0–46.0)
MCHC: 30.6 g/dL (ref 30.0–36.0)
MCV: 72.2 fl — ABNORMAL LOW (ref 78.0–100.0)
MONO ABS: 0.3 10*3/uL (ref 0.1–1.0)
MONOS PCT: 3.7 % (ref 3.0–12.0)
NEUTROS PCT: 78.5 % — AB (ref 43.0–77.0)
Neutro Abs: 7.2 10*3/uL (ref 1.4–7.7)
PLATELETS: 312 10*3/uL (ref 150.0–400.0)
RBC: 4.6 Mil/uL (ref 3.87–5.11)
RDW: 18.4 % — ABNORMAL HIGH (ref 11.5–15.5)
WBC: 9.2 10*3/uL (ref 4.0–10.5)

## 2015-01-10 LAB — VITAMIN B12: Vitamin B-12: 422 pg/mL (ref 211–911)

## 2015-01-10 LAB — BASIC METABOLIC PANEL
BUN: 10 mg/dL (ref 6–23)
CO2: 26 meq/L (ref 19–32)
Calcium: 8.8 mg/dL (ref 8.4–10.5)
Chloride: 105 mEq/L (ref 96–112)
Creatinine, Ser: 0.8 mg/dL (ref 0.4–1.2)
GFR: 80.82 mL/min (ref >60.00)
GLUCOSE: 134 mg/dL — AB (ref 70–99)
POTASSIUM: 4.2 meq/L (ref 3.5–5.1)
Sodium: 136 mEq/L (ref 135–145)

## 2015-01-10 LAB — BRAIN NATRIURETIC PEPTIDE: Pro B Natriuretic peptide (BNP): 439 pg/mL — ABNORMAL HIGH (ref 0.0–100.0)

## 2015-01-10 LAB — VITAMIN D 25 HYDROXY (VIT D DEFICIENCY, FRACTURES): VITD: 15.21 ng/mL — AB (ref 30.00–100.00)

## 2015-01-10 MED ORDER — FUROSEMIDE 20 MG PO TABS: 20.0000 mg | ORAL_TABLET | ORAL | Status: DC | PRN

## 2015-01-10 MED ORDER — ESOMEPRAZOLE MAGNESIUM 40 MG PO CPDR: 40.0000 mg | DELAYED_RELEASE_CAPSULE | Freq: Every day | ORAL | Status: DC

## 2015-01-10 NOTE — Telephone Encounter (Signed)
Pt is in hospital and saw dr Rockey Situ. Now patient wants to see dr Rockey Situ as we primary cardiologist.

## 2015-01-10 NOTE — Progress Notes (Signed)
Subjective:    Patient ID: Brandy Armstrong, female    DOB: 1946-03-07, 69 y.o.   MRN: [id number]  HPI  Brandy Armstrong is a 69 yo female here for a Hospital follow up from A-fib and discharge date 01/04/2015.   [facility] 01/02/15- Had ablation in past, felt she was in A-fib and went to ER. Dr. Rockey Situ cardioverted her. Went to rheumatologist.   Felt jittery in last 2 months, unable to write well Tingling on balls of bilateral feet when lying down.  Right hand dominant.   Pt denies being SOB.   Not following dietary restrictions as recommended on d/c.  Not taking D3, or B-complex vitamin supplementation.   Dull frontal headache.   Advil PM- helped  Taking iron x 4 days only.   Pt was anemic in hospital.    Review of Systems  Constitutional: Negative for fever, chills, diaphoresis, fatigue and unexpected weight change.  HENT: Negative for tinnitus and trouble swallowing.   Eyes: Negative for visual disturbance.  Respiratory: Negative for chest tightness, shortness of breath and wheezing.   Cardiovascular: Negative for chest pain, palpitations and leg swelling.  Gastrointestinal: Negative for vomiting, diarrhea and rectal pain.  Musculoskeletal: Negative for myalgias, neck pain and neck stiffness.  Skin: Negative for rash.  Neurological: Positive for tremors, weakness, light-headedness, numbness and headaches.  Hematological: Does not bruise/bleed easily.  Psychiatric/Behavioral: Negative for suicidal ideas and self-injury. The patient is not nervous/anxious.    Past Medical History  Diagnosis Date  . Rheumatoid arthritis     On methotrexate and orencia  . GERD (gastroesophageal reflux disease)   . Atrial fibrillation     a. s/p ablation 01/2012 in [facility] by Dr. Boyd Kerbs;  b. On sotalol & Xarelto;  c. 02/2014 Echo: EF 50-55%, mild conc LVH, nl LA size/structure;  d. Recurrent afib 8/15 & 08/29/2014.  [name] Urinary incontinence   . Colon polyps   . Depression with anxiety   . Barretts esophagus    . Rectal fistula   . Vitamin D deficiency   . Chest pain     a. 02/2014 Myoview: Ef 50%, no ischemia.    History   Social History  . Marital Status: Single    Spouse Name: N/A    Number of Children: 0  . Years of Education: 16   Occupational History  . Not on file.   Social History Main Topics  . Smoking status: Former Smoker -- 1.00 packs/day for 40 years    Quit date: 12/16/2011  . Smokeless tobacco: Never Used  . Alcohol Use: Yes     Comment: Occasionally has a drink  . Drug Use: No  . Sexual Activity: Yes   Other Topics Concern  . Not on file   Social History Narrative   Brandy Armstrong was born and reared in [place]. She attended [employer] and graduated in 1971 with her [employer] in Education. 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 She then worked for a [employer] in [place]. She is single. She is currently retired. She loves reading. She loves to spend time with her dog.     Past Surgical History  Procedure Laterality Date  . Cholecystectomy  1987  . Abdominal hysterectomy  1992  . Oophrectomy Bilateral 1992  . Cardiac electrophysiology study and ablation  2013    Family History  Problem Relation Age of Onset  . Depression Mother   .  Cancer Mother 43    pancreatic cancer  . Cancer Father 64    colon cancer  . Cancer Sister 30    breast cancer  . Diabetes Brother     Allergies  Allergen Reactions  . Codeine Nausea Only  . Latex     Current Outpatient Prescriptions on File Prior to Visit  Medication Sig Dispense Refill  . budesonide-formoterol (SYMBICORT) 160-4.5 MCG/ACT inhaler Inhale 2 puffs into the lungs 2 (two) times daily.    . cholestyramine (QUESTRAN) 4 G packet Take 1 packet by mouth as needed.    . clonazePAM (KLONOPIN) 0.5 MG tablet Take 1 tablet (0.5 mg total) by mouth 3 (three) times daily as needed. 90 tablet 1  . diltiazem (CARDIZEM) 30 MG tablet Take 1 tablet (30 mg  total) by mouth 2 (two) times daily. (Patient taking differently: Take 30 mg by mouth 3 (three) times daily. ) 60 tablet 3  . escitalopram (LEXAPRO) 20 MG tablet Take 1 tablet (20 mg total) by mouth daily. 30 tablet 6  . levalbuterol (XOPENEX HFA) 45 MCG/ACT inhaler Inhale 2 puffs into the lungs every 4 (four) hours as needed for wheezing.    . predniSONE (DELTASONE) 5 MG tablet Take 5 mg by mouth as needed.    . Rivaroxaban (XARELTO) 20 MG TABS tablet Take 20 mg by mouth daily with supper.    . sotalol (BETAPACE) 120 MG tablet Take 120 mg by mouth 2 (two) times daily.     . Tiotropium Bromide Monohydrate (SPIRIVA RESPIMAT) 2.5 MCG/ACT AERS Inhale into the lungs daily.     No current facility-administered medications on file prior to visit.      Objective:   Physical Exam  Constitutional: She is oriented to person, place, and time. She appears well-developed and well-nourished. No distress.  HENT:  Head: Normocephalic and atraumatic.  Right Ear: External ear normal.  Left Ear: External ear normal.  Eyes: Conjunctivae and EOM are normal. Pupils are equal, round, and reactive to light. Right eye exhibits no discharge. Left eye exhibits no discharge. No scleral icterus.  Neck: Normal range of motion. Neck supple. No thyromegaly present.  Cardiovascular: Normal rate, regular rhythm, normal heart sounds and intact distal pulses.  Exam reveals no gallop and no friction rub.   No murmur heard. Pulmonary/Chest: Effort normal and breath sounds normal. No respiratory distress. She has no wheezes. She has no rales. She exhibits no tenderness.  Musculoskeletal: She exhibits no edema or tenderness.  Lymphadenopathy:    She has no cervical adenopathy.  Neurological: She is alert and oriented to person, place, and time. No cranial nerve deficit. She exhibits normal muscle tone. Coordination normal.  Pt does have bilateral hand tremors. Will do lab work.   Skin: Skin is warm and dry. No rash noted. She  is not diaphoretic.  Psychiatric: She has a normal mood and affect. Her behavior is normal. Judgment and thought content normal.   BP 108/68 mmHg  Pulse 61  Temp(Src) 98.1 F (36.7 C) (Oral)  Resp 14  Ht 5\' 6"  (1.676 m)  Wt 212 lb 12 oz (96.503 kg)  BMI 34.36 kg/m2  SpO2 95%    Assessment & Plan:

## 2015-01-10 NOTE — Telephone Encounter (Signed)
Left message for pt that per Dr. Rockey Situ, it is the pt's choice as to which Dr they prefer to see and she can move her appts around if she wishes.  Asked her to call back during office hours to schedule this.

## 2015-01-10 NOTE — Patient Instructions (Addendum)
Please visit the lab before leaving today.   Follow up in 1 week.   Anemia, Nonspecific Anemia is a condition in which the concentration of red blood cells or hemoglobin in the blood is below normal. Hemoglobin is a substance in red blood cells that carries oxygen to the tissues of the body. Anemia results in not enough oxygen reaching these tissues.  CAUSES  Common causes of anemia include:   Excessive bleeding. Bleeding may be internal or external. This includes excessive bleeding from periods (in women) or from the intestine.   Poor nutrition.   Chronic kidney, thyroid, and liver disease.  Bone marrow disorders that decrease red blood cell production.  Cancer and treatments for cancer.  HIV, AIDS, and their treatments.  Spleen problems that increase red blood cell destruction.  Blood disorders.  Excess destruction of red blood cells due to infection, medicines, and autoimmune disorders. SIGNS AND SYMPTOMS   Minor weakness.   Dizziness.   Headache.  Palpitations.   Shortness of breath, especially with exercise.   Paleness.  Cold sensitivity.  Indigestion.  Nausea.  Difficulty sleeping.  Difficulty concentrating. Symptoms may occur suddenly or they may develop slowly.  DIAGNOSIS  Additional blood tests are often needed. These help your health care provider determine the best treatment. Your health care provider will check your stool for blood and look for other causes of blood loss.  TREATMENT  Treatment varies depending on the cause of the anemia. Treatment can include:   Supplements of iron, vitamin X44, or folic acid.   Hormone medicines.   A blood transfusion. This may be needed if blood loss is severe.   Hospitalization. This may be needed if there is significant continual blood loss.   Dietary changes.  Spleen removal. HOME CARE INSTRUCTIONS Keep all follow-up appointments. It often takes many weeks to correct anemia, and having  your health care provider check on your condition and your response to treatment is very important. SEEK IMMEDIATE MEDICAL CARE IF:   You develop extreme weakness, shortness of breath, or chest pain.   You become dizzy or have trouble concentrating.  You develop heavy vaginal bleeding.   You develop a rash.   You have bloody or black, tarry stools.   You faint.   You vomit up blood.   You vomit repeatedly.   You have abdominal pain.  You have a fever or persistent symptoms for more than 2-3 days.   You have a fever and your symptoms suddenly get worse.   You are dehydrated.  MAKE SURE YOU:  Understand these instructions.  Will watch your condition.  Will get help right away if you are not doing well or get worse. Document Released: 01/23/2005 Document Revised: 08/18/2013 Document Reviewed: 06/11/2013 [facility] Patient Information 2015 [place], [place]. This information is not intended to replace advice given to you by your health care provider. Make sure you discuss any questions you have with your health care provider.

## 2015-01-10 NOTE — Progress Notes (Signed)
Pre-visit discussion using our clinic review tool. No additional management support is needed unless otherwise documented below in the visit note.  

## 2015-01-12 ENCOUNTER — Telehealth: Payer: Self-pay | Admitting: Nurse Practitioner

## 2015-01-12 NOTE — Telephone Encounter (Signed)
Wanting lab results

## 2015-01-12 NOTE — Telephone Encounter (Signed)
Left message on VM for pt to return call.

## 2015-01-13 ENCOUNTER — Ambulatory Visit (INDEPENDENT_AMBULATORY_CARE_PROVIDER_SITE_OTHER): Payer: Medicare Other | Admitting: Cardiovascular Disease

## 2015-01-13 ENCOUNTER — Encounter: Payer: Self-pay | Admitting: Cardiovascular Disease

## 2015-01-13 VITALS — BP 90/64 | HR 75 | Ht 66.0 in | Wt 212.5 lb

## 2015-01-13 DIAGNOSIS — I4891 Unspecified atrial fibrillation: Secondary | ICD-10-CM

## 2015-01-13 DIAGNOSIS — Z09 Encounter for follow-up examination after completed treatment for conditions other than malignant neoplasm: Secondary | ICD-10-CM | POA: Insufficient documentation

## 2015-01-13 NOTE — Progress Notes (Signed)
Electrophysiologist: Dr. Isaias Sakai at [facility]  HPI  This is a 69 year old female who is here today for a follow-up visit regarding persistent atrial fibrillation.She maintained in sinus rhythm mostly with Sotalol. After I left my previous practice, she established with [facility] cardiology. She informed me that she underwent catheter ablation in February of 2013 which was scheduled anemic on any scheduled. She is also on a long and complex procedure and lasted 8 hours according to the patient. She was hospitalized recently at [facility] for atrial fibrillation with rapid ventricular response. She underwent successful cardioversion to normal sinus rhythm. She has been maintaining in sinus rhythm since then and feels back to her baseline.  Allergies  Allergen Reactions  . Codeine Nausea Only  . Latex      Current Outpatient Prescriptions on File Prior to Visit  Medication Sig Dispense Refill  . acetaminophen (TYLENOL) 500 MG tablet Take 1,000 mg by mouth daily.    . budesonide-formoterol (SYMBICORT) 160-4.5 MCG/ACT inhaler Inhale 2 puffs into the lungs 2 (two) times daily.    . cholestyramine (QUESTRAN) 4 G packet Take 1 packet by mouth as needed.    . clonazePAM (KLONOPIN) 0.5 MG tablet Take 1 tablet (0.5 mg total) by mouth 3 (three) times daily as needed. 90 tablet 1  . diltiazem (CARDIZEM) 30 MG tablet Take 1 tablet (30 mg total) by mouth 2 (two) times daily. (Patient taking differently: Take 30 mg by mouth 3 (three) times daily. ) 60 tablet 3  . escitalopram (LEXAPRO) 20 MG tablet Take 1 tablet (20 mg total) by mouth daily. 30 tablet 6  . esomeprazole (NEXIUM) 40 MG capsule Take 1 capsule (40 mg total) by mouth daily at 12 noon. 30 capsule 3  . furosemide (LASIX) 20 MG tablet Take 1 tablet (20 mg total) by mouth as needed for fluid. 30 tablet 0  . hydroxychloroquine (PLAQUENIL) 200 MG tablet Take 200 mg by mouth 2 (two) times daily.    [name] leflunomide (ARAVA) 20 MG tablet Take 20 mg by mouth daily.      [name] levalbuterol (XOPENEX HFA) 45 MCG/ACT inhaler Inhale 2 puffs into the lungs every 4 (four) hours as needed for wheezing.    . predniSONE (DELTASONE) 5 MG tablet Take 5 mg by mouth as needed.    . Rivaroxaban (XARELTO) 20 MG TABS tablet Take 20 mg by mouth daily with supper.    . sotalol (BETAPACE) 120 MG tablet Take 120 mg by mouth 2 (two) times daily.     . Tiotropium Bromide Monohydrate (SPIRIVA RESPIMAT) 2.5 MCG/ACT AERS Inhale into the lungs daily.     No current facility-administered medications on file prior to visit.     Past Medical History  Diagnosis Date  . Rheumatoid arthritis     On methotrexate and orencia  . GERD (gastroesophageal reflux disease)   . Atrial fibrillation     a. s/p ablation 01/2012 in [facility] by Dr. Boyd Armstrong;  b. On sotalol & Xarelto;  c. 02/2014 Echo: EF 50-55%, mild conc LVH, nl LA size/structure;  d. Recurrent afib 8/15 & 08/29/2014.  [name] Urinary incontinence   . Colon polyps   . Depression with anxiety   . Barretts esophagus   . Rectal fistula   . Vitamin D deficiency   . Chest pain     a. 02/2014 Myoview: Ef 50%, no ischemia.  [name] COPD (chronic obstructive pulmonary disease)      Past Surgical History  Procedure Laterality Date  . Cholecystectomy  1987  . Abdominal hysterectomy  1992  . Oophrectomy Bilateral 1992  . Cardiac electrophysiology study and ablation  2013     Family History  Problem Relation Age of Onset  . Depression Mother   . Cancer Mother 73    pancreatic cancer  . Cancer Father 35    colon cancer  . Cancer Sister 30    breast cancer  . Diabetes Brother      History   Social History  . Marital Status: Single    Spouse Name: N/A    Number of Children: 0  . Years of Education: 16   Occupational History  . Not on file.   Social History Main Topics  . Smoking status: Former Smoker -- 1.00 packs/day for 40 years    Quit date: 12/16/2011  . Smokeless tobacco: Never Used  . Alcohol Use: No     Comment:  Occasionally has a drink  . Drug Use: No  . Sexual Activity: Yes   Other Topics Concern  . Not on file   Social History Narrative   [name] was born and reared in [place]. She attended [employer] and graduated in 1971 with her [employer] in Education. 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 She then worked for a [employer] in [place]. She is single. She is currently retired. She loves reading. She loves to spend time with her dog.      ROS A 10 point review of system was performed. It is negative other than that mentioned in the history of present illness.   PHYSICAL EXAM   BP 90/64 mmHg  Pulse 75  Ht 5\' 6"  (1.676 m)  Wt 212 lb 8 oz (96.389 kg)  BMI 34.31 kg/m2 Constitutional: She is oriented to person, place, and time. She appears well-developed and well-nourished. She is in mild respiratory distress HENT: No nasal discharge.  Head: Normocephalic and atraumatic.  Eyes: Pupils are equal and round. No discharge.  Neck: Normal range of motion. Neck supple. No JVD present. No thyromegaly present.  Cardiovascular: Normal rate, irregular rhythm, normal heart sounds. Exam reveals no gallop and no friction rub. No murmur heard.  Pulmonary/Chest: Effort normal and breath sounds normal. No stridor. No respiratory distress. She has no wheezes. She has no rales. She exhibits no tenderness.  Abdominal: Soft. Bowel sounds are normal. She exhibits no distension. There is no tenderness. There is no rebound and no guarding.  Musculoskeletal: Normal range of motion. She exhibits no edema and no tenderness.  Neurological: She is alert and oriented to person, place, and time. Coordination normal.  Skin: Skin is warm and dry. No rash noted. She is not diaphoretic. No erythema. No pallor.  Psychiatric: She has a normal mood and affect. Her behavior is normal. Judgment and thought content normal.     EKG: Atrial fibrillation with  ventricular rate of 106 beats per minute. Nonspecific T wave changes.   ASSESSMENT AND PLAN

## 2015-01-13 NOTE — Assessment & Plan Note (Signed)
Pt to see [place] and cardiology. Repeat BNP, B12, BMET, CBC w/ diff, and Vitamin D. Work on diet and exercise. FU in 1 week.

## 2015-01-13 NOTE — Patient Instructions (Signed)
Stop taking Digoxin.  Continue other medications.   Schedule an appointment with Dr. Radene Knee ASAP.  Your physician wants you to follow-up in: 6 months.  You will receive a reminder letter in the mail two months in advance. If you don't receive a letter, please call our office to schedule the follow-up appointment.

## 2015-01-17 ENCOUNTER — Encounter: Payer: Medicare Other | Admitting: Nurse Practitioner

## 2015-01-20 NOTE — Assessment & Plan Note (Addendum)
The patient has persistent atrial fibrillation. Recent episodes required cardioversion given that she was mildly hypotensive. Continue long-term anticoagulation with Xarelto.  small dose diltiazem was added. However, this cannot be increased due to hypotension. I stopped digoxin today. I advised the patient to follow-up with her electrophysiologist at [facility]. If episodes of A. fib becomes more frequent, repeat ablation might be needed or a different antiarrhythmic medication. She is currently on sotalol.

## 2015-01-23 ENCOUNTER — Telehealth: Payer: Self-pay

## 2015-01-23 NOTE — Telephone Encounter (Signed)
Patient states that she has an appt with her [facility] Ep doctor this week  She will see how it goes and then let us know if she wants to schedule with Dr. Rayann Heman  I told her to let me know and I will be glad to schedule her  Patient verbalized understanding

## 2015-01-23 NOTE — Telephone Encounter (Signed)
Pt called, states Dr. Fletcher Anon suggested she see our "afib clinic in [place]". Please call pt and advise, pt has some questions.

## 2015-01-25 ENCOUNTER — Encounter: Payer: Self-pay | Admitting: Nurse Practitioner

## 2015-01-25 ENCOUNTER — Ambulatory Visit (INDEPENDENT_AMBULATORY_CARE_PROVIDER_SITE_OTHER): Payer: Medicare Other | Admitting: Nurse Practitioner

## 2015-01-25 VITALS — BP 94/64 | HR 86 | Temp 98.1°F | Ht 66.25 in | Wt 215.0 lb

## 2015-01-25 DIAGNOSIS — Z0000 Encounter for general adult medical examination without abnormal findings: Secondary | ICD-10-CM | POA: Diagnosis not present

## 2015-01-25 MED ORDER — CLONAZEPAM 0.5 MG PO TABS: 0.5000 mg | ORAL_TABLET | Freq: Three times a day (TID) | ORAL | Status: DC | PRN

## 2015-01-25 NOTE — Patient Instructions (Signed)

## 2015-01-25 NOTE — Progress Notes (Signed)
Pre visit review using our clinic review tool, if applicable. No additional management support is needed unless otherwise documented below in the visit note. 

## 2015-01-25 NOTE — Progress Notes (Signed)
   Subjective:    Patient ID: Brandy Armstrong, female    DOB: 07-Feb-1946, 69 y.o.   MRN: [id number]  HPI  Health Screenings  Mammogram- May 2015  Pap Smear- Full hysterectomy  Colonoscopy- Due this year, polyps in 2013 recommended 3 years PSA- N/A Bone Density - 2014, normal bone density Glaucoma- Recently- no glaucoma Hearing- Denies trouble with hearing  Hemoglobin A1C- 08/09/2014 6.1  Cholesterol- 2014- need updated. Pt not fasting today  Social  Alcohol intake- Rare Smoking history- Quit 12/16/2011 Smokers in home- Denies Illicit drug use- Denies Exercise- Not formal  Diet- Cooks at home, and goes out  Sexually Active- Denies  Multiple Partners- Denies  Safety  Patient feesl safe at home. Yes Patient does have smoke detectors at home- Yes Patient does wear sunscreen or protective clothing when in direct sunlight- Yes Patient does wear seat belt when driving or riding with others. -Yes  Activities of Daily Living Patient can do their own household chores. Denies needing assistance with: driving, feeding themselves, getting from bed to chair, getting to the toilet, bathing/showering, dressing, managing money, climbing flight of stairs, or preparing meals.   Depression Screen Patient denies losing interest in daily life, feeling hopeless, or crying easily over simple problems. - Denies   Fall Screen Patient is afraid of falling. Fell years ago on steps. Has not fallen in last year.   Memory Screen Patient denies problems with memory, misplacing items, and is able to balance checkbook/bank accounts.  Patient is alert, normal appearance, oriented to person/place/and time. Correctly identified the president of the [place], recall of 3 objects, and performing simple calculations.  Patient displays appropriate judgement and can read correct time from watch face.   Immunizations The following Immunizations are up to date: Influenza,  pneumonia, and tetanus.   Other Providers Dr.  Fletcher Anon- Cardiology  Seen at [facility] for Pulmonology  Review of Systems No ROS for annual wellness exam.      Objective:   Physical Exam  No Physical exam for annual wellness exam.   BP 94/64 mmHg  Pulse 86  Temp(Src) 98.1 F (36.7 C) (Oral)  Ht 5' 6.25" (1.683 m)  Wt 215 lb (97.523 kg)  BMI 34.43 kg/m2  SpO2 95% BP is historically low, not symptomatic     Assessment & Plan:  This is a routine wellness examination for this patient.  I reviewed all health maintenance protocols including mammography, colonoscopy, bone density. Needed referrals placed. Age and diagnosis appropriate screening labs were ordered.  Her immunization history was reviewed. Her current medications and allergies were reviewed and needed refills of her chronic medications were ordered if needed.  The plan for yearly health maintenance was discussed.     MEDICARE ATTESTATION I have personally reviewed:  The patient's medical and social history. The use of alcohol, tobacco and illicit drugs. The current medications and supplements. The patient's function ability including ADLs, fall risks, home safety risks, cognitive, and hearing and visual impairment.   Diet and physical activities. Evaluation for depression and mood disorders.    The patient's weight, height, BMI and visual acuity have been recorded in the chart.  I have made referrals, counseled and provided education to the patient based on review of the above.

## 2015-01-26 DIAGNOSIS — Z0000 Encounter for general adult medical examination without abnormal findings: Secondary | ICD-10-CM | POA: Insufficient documentation

## 2015-01-26 NOTE — Assessment & Plan Note (Signed)
Pt states she has had one before and I can not find evidence of this in the charting. The patient had her annual wellness visit and each domain was discussed and answered. The pt had no further questions. Pt was given health maintenance information in her AVS. FU yearly.

## 2015-01-30 DIAGNOSIS — Z6833 Body mass index (BMI) 33.0-33.9, adult: Secondary | ICD-10-CM | POA: Diagnosis not present

## 2015-01-30 DIAGNOSIS — I4891 Unspecified atrial fibrillation: Secondary | ICD-10-CM | POA: Diagnosis not present

## 2015-02-20 DIAGNOSIS — J449 Chronic obstructive pulmonary disease, unspecified: Secondary | ICD-10-CM | POA: Diagnosis not present

## 2015-02-20 DIAGNOSIS — Z01818 Encounter for other preprocedural examination: Secondary | ICD-10-CM | POA: Diagnosis not present

## 2015-02-20 DIAGNOSIS — M0579 Rheumatoid arthritis with rheumatoid factor of multiple sites without organ or systems involvement: Secondary | ICD-10-CM | POA: Diagnosis not present

## 2015-02-20 DIAGNOSIS — Z87891 Personal history of nicotine dependence: Secondary | ICD-10-CM | POA: Diagnosis not present

## 2015-02-20 DIAGNOSIS — M069 Rheumatoid arthritis, unspecified: Secondary | ICD-10-CM | POA: Diagnosis not present

## 2015-02-20 DIAGNOSIS — Z6834 Body mass index (BMI) 34.0-34.9, adult: Secondary | ICD-10-CM | POA: Diagnosis not present

## 2015-02-20 DIAGNOSIS — E119 Type 2 diabetes mellitus without complications: Secondary | ICD-10-CM | POA: Diagnosis not present

## 2015-02-20 DIAGNOSIS — I4891 Unspecified atrial fibrillation: Secondary | ICD-10-CM | POA: Diagnosis not present

## 2015-02-20 DIAGNOSIS — Z7952 Long term (current) use of systemic steroids: Secondary | ICD-10-CM | POA: Diagnosis not present

## 2015-02-20 DIAGNOSIS — K219 Gastro-esophageal reflux disease without esophagitis: Secondary | ICD-10-CM | POA: Diagnosis not present

## 2015-02-20 DIAGNOSIS — Z79899 Other long term (current) drug therapy: Secondary | ICD-10-CM | POA: Diagnosis not present

## 2015-02-20 DIAGNOSIS — E669 Obesity, unspecified: Secondary | ICD-10-CM | POA: Diagnosis not present

## 2015-02-20 DIAGNOSIS — R42 Dizziness and giddiness: Secondary | ICD-10-CM | POA: Diagnosis not present

## 2015-02-21 DIAGNOSIS — I4891 Unspecified atrial fibrillation: Secondary | ICD-10-CM | POA: Diagnosis not present

## 2015-02-21 DIAGNOSIS — M1990 Unspecified osteoarthritis, unspecified site: Secondary | ICD-10-CM | POA: Diagnosis not present

## 2015-02-21 DIAGNOSIS — M069 Rheumatoid arthritis, unspecified: Secondary | ICD-10-CM | POA: Diagnosis not present

## 2015-02-21 DIAGNOSIS — J449 Chronic obstructive pulmonary disease, unspecified: Secondary | ICD-10-CM | POA: Diagnosis not present

## 2015-02-21 DIAGNOSIS — K219 Gastro-esophageal reflux disease without esophagitis: Secondary | ICD-10-CM | POA: Diagnosis not present

## 2015-02-21 DIAGNOSIS — Z6834 Body mass index (BMI) 34.0-34.9, adult: Secondary | ICD-10-CM | POA: Diagnosis not present

## 2015-02-21 DIAGNOSIS — Z79899 Other long term (current) drug therapy: Secondary | ICD-10-CM | POA: Diagnosis not present

## 2015-02-21 DIAGNOSIS — E669 Obesity, unspecified: Secondary | ICD-10-CM | POA: Diagnosis not present

## 2015-02-21 DIAGNOSIS — Z87891 Personal history of nicotine dependence: Secondary | ICD-10-CM | POA: Diagnosis not present

## 2015-02-21 DIAGNOSIS — I4892 Unspecified atrial flutter: Secondary | ICD-10-CM | POA: Diagnosis not present

## 2015-02-22 DIAGNOSIS — E669 Obesity, unspecified: Secondary | ICD-10-CM | POA: Diagnosis not present

## 2015-02-22 DIAGNOSIS — K219 Gastro-esophageal reflux disease without esophagitis: Secondary | ICD-10-CM | POA: Diagnosis not present

## 2015-02-22 DIAGNOSIS — M069 Rheumatoid arthritis, unspecified: Secondary | ICD-10-CM | POA: Diagnosis not present

## 2015-02-22 DIAGNOSIS — M1990 Unspecified osteoarthritis, unspecified site: Secondary | ICD-10-CM | POA: Diagnosis not present

## 2015-02-22 DIAGNOSIS — J449 Chronic obstructive pulmonary disease, unspecified: Secondary | ICD-10-CM | POA: Diagnosis not present

## 2015-02-22 DIAGNOSIS — Z87891 Personal history of nicotine dependence: Secondary | ICD-10-CM | POA: Diagnosis not present

## 2015-02-22 DIAGNOSIS — Z6834 Body mass index (BMI) 34.0-34.9, adult: Secondary | ICD-10-CM | POA: Diagnosis not present

## 2015-02-22 DIAGNOSIS — I4891 Unspecified atrial fibrillation: Secondary | ICD-10-CM | POA: Diagnosis not present

## 2015-02-22 DIAGNOSIS — I4892 Unspecified atrial flutter: Secondary | ICD-10-CM | POA: Diagnosis not present

## 2015-02-22 DIAGNOSIS — Z79899 Other long term (current) drug therapy: Secondary | ICD-10-CM | POA: Diagnosis not present

## 2015-03-07 ENCOUNTER — Ambulatory Visit (INDEPENDENT_AMBULATORY_CARE_PROVIDER_SITE_OTHER): Payer: Medicare Other | Admitting: Nurse Practitioner

## 2015-03-07 ENCOUNTER — Encounter: Payer: Self-pay | Admitting: Nurse Practitioner

## 2015-03-07 VITALS — BP 102/62 | HR 111 | Temp 97.7°F | Resp 14 | Ht 66.25 in | Wt 201.8 lb

## 2015-03-07 DIAGNOSIS — I4891 Unspecified atrial fibrillation: Secondary | ICD-10-CM | POA: Diagnosis not present

## 2015-03-07 DIAGNOSIS — R202 Paresthesia of skin: Secondary | ICD-10-CM | POA: Diagnosis not present

## 2015-03-07 DIAGNOSIS — R300 Dysuria: Secondary | ICD-10-CM

## 2015-03-07 DIAGNOSIS — R200 Anesthesia of skin: Secondary | ICD-10-CM

## 2015-03-07 LAB — POCT URINALYSIS DIPSTICK
Bilirubin, UA: NEGATIVE
GLUCOSE UA: NEGATIVE
KETONES UA: NEGATIVE
Nitrite, UA: POSITIVE
PH UA: 5.5
Protein, UA: NEGATIVE
Spec Grav, UA: 1.025
Urobilinogen, UA: 0.2

## 2015-03-07 MED ORDER — GABAPENTIN 300 MG PO CAPS: 300.0000 mg | ORAL_CAPSULE | Freq: Every day | ORAL | Status: DC

## 2015-03-07 MED ORDER — SULFAMETHOXAZOLE-TRIMETHOPRIM 800-160 MG PO TABS: 1.0000 | ORAL_TABLET | Freq: Two times a day (BID) | ORAL | Status: DC

## 2015-03-07 NOTE — Assessment & Plan Note (Signed)
POCT urine shows infection is present. Will obtain culture. Bactrim DS twice daily for 5 days.

## 2015-03-07 NOTE — Assessment & Plan Note (Signed)
Lab Results  Component Value Date   [id number] 834 01/10/2015   Lab Results  Component Value Date   HGBA1C 6.1 08/09/2014    BMET in Jan shows elevated BS.   Will try Gabapentin to help her with symptoms. 300 mg at night.

## 2015-03-07 NOTE — Assessment & Plan Note (Signed)
Currently in Afib- almost hypotensive, not symptomatic. Taking diltiazem and Sotalol. Refused EKG. She is going to contact her Dr. Parks Ranger performed the last ablation. Asked to go to ER if dizzy or feels bad for cardioversion if needed. Will FU in 2 weeks.

## 2015-03-07 NOTE — Progress Notes (Signed)
Pre visit review using our clinic review tool, if applicable. No additional management support is needed unless otherwise documented below in the visit note. 

## 2015-03-07 NOTE — Progress Notes (Signed)
Subjective:    Patient ID: Brandy Armstrong, female    DOB: 1946-12-25, 69 y.o.   MRN: [id number]  HPI  Brandy Armstrong is a 69 yo female with a CC of peripheral neuropathy and dysuria.   1) Dysuria, decreased amount of urine out put, urge, pressure x 1 month. Catheterization recently after the ablation. Worsened symptoms.    2) At night it feet burn and ache, tingling  Diet- Fifty-six day diet  Exercise- No formal Capsacin burned  Advil- not helpful Aspercreme- Not helpful   3) Heart ablation 2 weeks ago at [facility]. Currently in and out of a-fib today. She refuses a EKG and will take her Diltiazem as instructed and call her surgeon's office.   Review of Systems  Constitutional: Negative for fever, chills, diaphoresis and fatigue.  Eyes: Negative for visual disturbance.  Respiratory: Negative for chest tightness, shortness of breath and wheezing.   Cardiovascular: Negative for chest pain, palpitations and leg swelling.  Gastrointestinal: Negative for nausea, vomiting and diarrhea.  Genitourinary: Positive for dysuria, urgency, frequency and decreased urine volume. Negative for hematuria and flank pain.  Skin: Negative for rash.  Neurological: Positive for numbness. Negative for dizziness, weakness and headaches.  Psychiatric/Behavioral: The patient is not nervous/anxious.    Past Medical History  Diagnosis Date  . Rheumatoid arthritis     On methotrexate and orencia  . GERD (gastroesophageal reflux disease)   . Atrial fibrillation     a. s/p ablation 01/2012 in [facility] by Dr. Boyd Kerbs;  b. On sotalol & Xarelto;  c. 02/2014 Echo: EF 50-55%, mild conc LVH, nl LA size/structure;  d. Recurrent afib 8/15 & 08/29/2014.  [name] Urinary incontinence   . Colon polyps   . Depression with anxiety   . Barretts esophagus   . Rectal fistula   . Vitamin D deficiency   . Chest pain     a. 02/2014 Myoview: Ef 50%, no ischemia.  [name] COPD (chronic obstructive pulmonary disease)     History   Social History   . Marital Status: Single    Spouse Name: N/A  . Number of Children: 0  . Years of Education: 16   Occupational History  . Not on file.   Social History Main Topics  . Smoking status: Former Smoker -- 1.00 packs/day for 40 years    Quit date: 12/16/2011  . Smokeless tobacco: Never Used  . Alcohol Use: No     Comment: Occasionally has a drink  . Drug Use: No  . Sexual Activity: Yes   Other Topics Concern  . Not on file   Social History Narrative   Brandy Armstrong was born and reared in [place]. She attended [employer] and graduated in 1971 with her [employer] in Education. 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 She then worked for a [employer] in [place]. She is single. She is currently retired. She loves reading. She loves to spend time with her dog.     Past Surgical History  Procedure Laterality Date  . Cholecystectomy  1987  . Abdominal hysterectomy  1992  . Oophrectomy Bilateral 1992  . Cardiac electrophysiology study and ablation  2013    Family History  Problem Relation Age of Onset  . Depression Mother   . Cancer Mother 45    pancreatic cancer  . Cancer Father 19    colon cancer  . Cancer Sister 45    breast  cancer  . Diabetes Brother     Allergies  Allergen Reactions  . Codeine Nausea Only  . Latex     Current Outpatient Prescriptions on File Prior to Visit  Medication Sig Dispense Refill  . acetaminophen (TYLENOL) 500 MG tablet Take 1,000 mg by mouth daily.    . budesonide-formoterol (SYMBICORT) 160-4.5 MCG/ACT inhaler Inhale 2 puffs into the lungs 2 (two) times daily.    . cholestyramine (QUESTRAN) 4 G packet Take 1 packet by mouth as needed.    . clonazePAM (KLONOPIN) 0.5 MG tablet Take 1 tablet (0.5 mg total) by mouth 3 (three) times daily as needed. 90 tablet 1  . diltiazem (CARDIZEM) 30 MG tablet Take 1 tablet (30 mg total) by mouth 2 (two) times daily. (Patient taking differently: Take  30 mg by mouth 3 (three) times daily. ) 60 tablet 3  . escitalopram (LEXAPRO) 20 MG tablet Take 1 tablet (20 mg total) by mouth daily. 30 tablet 6  . esomeprazole (NEXIUM) 40 MG capsule Take 1 capsule (40 mg total) by mouth daily at 12 noon. 30 capsule 3  . furosemide (LASIX) 20 MG tablet Take 1 tablet (20 mg total) by mouth as needed for fluid. 30 tablet 0  . hydroxychloroquine (PLAQUENIL) 200 MG tablet Take 200 mg by mouth 2 (two) times daily.    [name] leflunomide (ARAVA) 20 MG tablet Take 20 mg by mouth daily.    [name] levalbuterol (XOPENEX HFA) 45 MCG/ACT inhaler Inhale 2 puffs into the lungs every 4 (four) hours as needed for wheezing.    . predniSONE (DELTASONE) 5 MG tablet Take 5 mg by mouth as needed.    . Rivaroxaban (XARELTO) 20 MG TABS tablet Take 20 mg by mouth daily with supper.    . sotalol (BETAPACE) 120 MG tablet Take 120 mg by mouth 2 (two) times daily.     . Tiotropium Bromide Monohydrate (SPIRIVA RESPIMAT) 2.5 MCG/ACT AERS Inhale into the lungs daily.     No current facility-administered medications on file prior to visit.      Objective:   Physical Exam  Constitutional: She is oriented to person, place, and time. She appears well-developed and well-nourished. No distress.  BP 102/62 mmHg  Pulse 111  Temp(Src) 97.7 F (36.5 C) (Oral)  Resp 14  Ht 5' 6.25" (1.683 m)  Wt 201 lb 12.8 oz (91.536 kg)  BMI 32.32 kg/m2  SpO2 96%   HENT:  Head: Normocephalic and atraumatic.  Right Ear: External ear normal.  Left Ear: External ear normal.  Eyes: Right eye exhibits no discharge. Left eye exhibits no discharge. No scleral icterus.  Neck: Normal range of motion. Neck supple. No thyromegaly present.  Cardiovascular: Normal rate, normal heart sounds and intact distal pulses.  Exam reveals no gallop and no friction rub.   No murmur heard. A-fib  Pulmonary/Chest: Effort normal and breath sounds normal. No respiratory distress. She has no wheezes. She has no rales. She exhibits no  tenderness.  Neurological: She is alert and oriented to person, place, and time. No cranial nerve deficit. She exhibits normal muscle tone. Coordination normal.  Skin: Skin is warm and dry. No rash noted. She is not diaphoretic.  Psychiatric: She has a normal mood and affect. Her behavior is normal. Judgment and thought content normal.      Assessment & Plan:

## 2015-03-07 NOTE — Patient Instructions (Signed)
Take the Bactrim twice daily for 5 days.   Drink lots of water.   Follow up with your surgeon as needed.   Gabapentin at night for your foot pain.   Follow up in 2 weeks.

## 2015-03-10 LAB — URINE CULTURE

## 2015-03-13 ENCOUNTER — Telehealth: Payer: Self-pay | Admitting: *Deleted

## 2015-03-13 MED ORDER — ESCITALOPRAM OXALATE 20 MG PO TABS: 20.0000 mg | ORAL_TABLET | Freq: Every day | ORAL | Status: DC

## 2015-03-13 NOTE — Telephone Encounter (Signed)
Rx sent to pharmacy by escript  

## 2015-03-21 DIAGNOSIS — Z885 Allergy status to narcotic agent status: Secondary | ICD-10-CM | POA: Diagnosis not present

## 2015-03-21 DIAGNOSIS — I4891 Unspecified atrial fibrillation: Secondary | ICD-10-CM | POA: Diagnosis not present

## 2015-03-21 DIAGNOSIS — Z6832 Body mass index (BMI) 32.0-32.9, adult: Secondary | ICD-10-CM | POA: Diagnosis not present

## 2015-03-21 DIAGNOSIS — K219 Gastro-esophageal reflux disease without esophagitis: Secondary | ICD-10-CM | POA: Diagnosis not present

## 2015-03-21 DIAGNOSIS — Z79899 Other long term (current) drug therapy: Secondary | ICD-10-CM | POA: Diagnosis not present

## 2015-03-21 DIAGNOSIS — E669 Obesity, unspecified: Secondary | ICD-10-CM | POA: Diagnosis not present

## 2015-03-21 DIAGNOSIS — M069 Rheumatoid arthritis, unspecified: Secondary | ICD-10-CM | POA: Diagnosis not present

## 2015-03-21 DIAGNOSIS — M1990 Unspecified osteoarthritis, unspecified site: Secondary | ICD-10-CM | POA: Diagnosis not present

## 2015-03-21 DIAGNOSIS — Z91040 Latex allergy status: Secondary | ICD-10-CM | POA: Diagnosis not present

## 2015-03-21 DIAGNOSIS — J449 Chronic obstructive pulmonary disease, unspecified: Secondary | ICD-10-CM | POA: Diagnosis not present

## 2015-03-22 ENCOUNTER — Ambulatory Visit: Payer: Medicare Other | Admitting: Nurse Practitioner

## 2015-04-22 NOTE — Consult Note (Signed)
General Aspect 69 yo with history of paroxysmal atrial fibrillation s/p ablation at [facility] in 2/13 presenting with increased exertional dyspnea for one week,  presentign to the ER, noted to be in atrial fib with rate 140 bpm.  Admitted last 3/15 with similar sx. She convert to NSR with sotaolol 120 BID and diltiazem. In teh ER yesterday, she recived lasix. BP low today and she was started on IVF at 100 cc/hr.   BP is now stable.  Cardiac enzymes negative .    Prior echo 3/15 showing normal EF,  RV not well visualized, no gross valvular abnormalities.    She does report being on inhalers for COPD. SHe is seen at [facility] pulmonary and cardiology. As well as Dr. Fletcher Anon in [place].  Recently seen last Tuesday at [facility] pulmonary, did "lots of tests"   PMH: 1. Atrial fibrillation: Paroxysmal.  s/p atrial fibrillation ablation at [facility] in 2/13.  Has been on sotalol and Xarelto.  2. Cardiolite 2013 at [facility] was normal, normal again in 3/15 3. Rheumatoid arthritis 4. GERD with Barrett's esophagus 5. Depression/anxiety 6. Cholecystectomy 7. Hysterectomy 8. Chronic diarrhea, treated with imodium 9. COPD, smoker for 40 years   Physical Exam:  GEN well developed, well nourished, no acute distress, obese   HEENT hearing intact to voice, moist oral mucosa   NECK supple   RESP normal resp effort  clear BS   CARD Irregular rate and rhythm  No murmur   ABD denies tenderness  soft   LYMPH negative neck   EXTR negative edema   SKIN normal to palpation   NEURO motor/sensory function intact   PSYCH alert, A+O to time, place, person, good insight   Review of Systems:  Subjective/Chief Complaint SOB   General: Weakness   Skin: No Complaints   ENT: No Complaints   Eyes: No Complaints   Neck: No Complaints   Respiratory: Short of breath   Cardiovascular: Palpitations  Dyspnea   Gastrointestinal: diarrhea (chronic issue)   Genitourinary: No Complaints   Vascular: No Complaints    Musculoskeletal: No Complaints   Neurologic: No Complaints   Hematologic: No Complaints   Endocrine: No Complaints   Psychiatric: No Complaints   Review of Systems: All other systems were reviewed and found to be negative   Medications/Allergies Reviewed Medications/Allergies reviewed   Family & Social History:  Family and Social History:  Family History Coronary Artery Disease  Smoking   Social History positive  tobacco   + Tobacco Prior (greater than 1 year)   Place of Living Home        Admit Diagnosis:   ATRIAL FIBRILLATION: Onset Date: 13-Aug-2014, Status: Active, Description: ATRIAL FIBRILLATION  Home Medications: Medication Instructions Status  Xarelto 20 mg oral tablet 1 tab(s) orally once a day (in the evening) Active  Klonopin 0.5 mg oral tablet 1 tab(s) orally 3 times a day Active  Lexapro 20 mg oral tablet 1 tab(s) orally once a day Active  Orencia 125 mg/mL subcutaneous solution 1 milliliter(s) subcutaneous once a week on Sunday Active  NexIUM 40 mg oral delayed release capsule 1 cap(s) orally 2 times a day Active  predniSONE 5 mg oral tablet 1 tab(s) orally up to 4 times a day, As Needed for arthritis pain Active  sotalol 120 mg oral tablet 1 tab(s) orally 2 times a day Active   Lab Results:  Routine Chem:  15-Aug-15 03:16   Glucose, Serum 99  BUN 9  Creatinine (comp) 0.87  Sodium,  Serum 143  Potassium, Serum 3.9  Chloride, Serum  108  CO2, Serum 25  Calcium (Total), Serum  8.0  Anion Gap 10  Osmolality (calc) 284  eGFR (African American) >60  eGFR (Non-African American) >60 (eGFR values <30mL/min/1.73 m2 may be an indication of chronic kidney disease (CKD). Calculated eGFR is useful in patients with stable renal function. The eGFR calculation will not be reliable in acutely ill patients when serum creatinine is changing rapidly. It is not useful in  patients on dialysis. The eGFR calculation may not be applicable to patients at the low and  high extremes of body sizes, pregnant women, and vegetarians.)  Cardiac:  14-Aug-15 17:21   Troponin I < 0.02 (0.00-0.05 0.05 ng/mL or less: NEGATIVE  Repeat testing in 3-6 hrs  if clinically indicated. >0.05 ng/mL: POTENTIAL  MYOCARDIAL INJURY. Repeat  testing in 3-6 hrs if  clinically indicated. NOTE: An increase or decrease  of 30% or more on serial  testing suggests a  clinically important change)    22:59   Troponin I < 0.02 (0.00-0.05 0.05 ng/mL or less: NEGATIVE  Repeat testing in 3-6 hrs  if clinically indicated. >0.05 ng/mL: POTENTIAL  MYOCARDIAL INJURY. Repeat  testing in 3-6 hrs if  clinically indicated. NOTE: An increase or decrease  of 30% or more on serial  testing suggests a  clinically important change)  15-Aug-15 03:16   Troponin I < 0.02 (0.00-0.05 0.05 ng/mL or less: NEGATIVE  Repeat testing in 3-6 hrs  if clinically indicated. >0.05 ng/mL: POTENTIAL  MYOCARDIAL INJURY. Repeat  testing in 3-6 hrs if  clinically indicated. NOTE: An increase or decrease  of 30% or more on serial  testing suggests a  clinically important change)   EKG:  Interpretation EKG shows atrial fibrillation with rate 132 bpm, nonspecific T wave ABN   Radiology Results: XRay:    14-Aug-15 18:17, Chest Portable Single View  Chest Portable Single View   REASON FOR EXAM:    short of breath  COMMENTS:       PROCEDURE: DXR - DXR PORTABLE CHEST SINGLE VIEW  - Aug 12 2014  6:17PM     CLINICAL DATA:  Chest pain, short of breath    EXAM:  PORTABLE CHEST - 1 VIEW    COMPARISON:  Radiograph 03/11/2014    FINDINGS:  Normal cardiac silhouette. Low lung volumes and mild basilar  atelectasis. Normal pulmonary vasculature. No infiltrate or  effusion.     IMPRESSION:  Low lung volumes and mild basilar atelectasis      Electronically Signed    By: Suzy Bouchard M.D.    On: 08/12/2014 18:20         Verified By: Rennis Golden, M.D.,    Codeine: N/V  Cipro:  Rash  Latex: Unknown  Vital Signs/Nurse's Notes: **Vital Signs.:   15-Aug-15 05:00  Vital Signs Type Routine  Temperature Temperature (F) 98.2  Celsius 36.7  Temperature Source oral  Pulse Pulse 90  Respirations Respirations 13  Systolic BP Systolic BP 99  Diastolic BP (mmHg) Diastolic BP (mmHg) 83  Mean BP 88  Pulse Ox % Pulse Ox % 98  Pulse Ox Activity Level  At rest  Oxygen Delivery Room Air/ 21 %    Impression 69 yo with history of paroxysmal atrial fibrillation was admitted with recurrent atrial fibrillation, profound dyspnea. last epsiode 3/15  1. Atrial fibrillation:  Started on diltiazem GGT, still in atrial fibrillation. Rate much improved. --will start diltiazem 30 mg  po q6 with hold parameters --contineu sotalol 120 mg po BID - Continue Xarelto.  If she does not convert to NSR, may need to consider cardioversion (could potentially be done as an outpt)  2. Exertional dyspnea:  Acute on chroniuc diastolic CHF exacerbated by atrial fib She drinks large amounts of ice tea from [employer], Will likely need outpt lasix QOD or daily --Will hold IVF --lasix for SOB sx  3. Gastroesophageal reflux disease. continue proton pump inhibitor.  4. Chronic obstructive pulmonary disease.  Continue p.r.n. nebulizers for shortness of breath. currently no wheezing. Consider outpt switch to xopenex, avoid albuterol  5. Venous thromboembolism prophylaxis  on Xarelto.  6. Rheum arthritis  7. Chronic diarrhea imodium ordered at her request   Electronic Signatures: Ida Rogue (MD)  (Signed 15-Aug-15 11:23)  Authored: General Aspect/Present Illness, History and Physical Exam, Review of System, Family & Social History, Past Medical History, Health Issues, Home Medications, Labs, EKG , Radiology, Allergies, Vital Signs/Nurse's Notes, Impression/Plan   Last Updated: 15-Aug-15 11:23 by Ida Rogue (MD)

## 2015-04-22 NOTE — Discharge Summary (Signed)
PATIENT NAME:  Brandy Armstrong, Brandy Armstrong MR#:  [id number] DATE OF BIRTH:  [birth year]  DATE OF ADMISSION:  03/11/2014 DATE OF DISCHARGE:  03/13/2014  PRESENTING COMPLAINT: Shortness of breath and chest pain.   DISCHARGE DIAGNOSES: 1.  Rapid atrial fibrillation with rapid ventricular response, now in sinus rhythm.  2.  Shortness of breath.  3.  Hypertension.  4.  Anxiety.   CONDITION ON DISCHARGE: Fair.   CODE STATUS: FULL.  DISCHARGE MEDICATIONS:  1.  Xarelto 20 mg p.o. daily.  2.  Lexapro 20 mg daily.  3.  Orencia 125 mg/mL 1 mL subcutaneous every week on Sunday.  4.  Nexium 40 mg b.i.d.  5.  Prednisone 5 mg 1 tablet up to 4 times a day as needed for arthritis.  6.  Sotalol 120 mg b.i.d.  7.  Klonopin 0.5 mg 3 times a day.  8.  Lasix 20 mg daily.  9.  Klor-Con 10 mEq p.o. daily.  10.  Diltiazem CD 120 mg daily.   DISCHARGE DIET: Low-sodium.   DISCHARGE INSTRUCTIONS: 1.  Follow up with Dr. Fletcher Anon, cardiology.  2.  Follow up with Pervis Hocking, nurse practitioner, with [facility].  CONSULTANTS: Cardiology with Dr. Loralie Champagne.   DIAGNOSTIC DATA: Echo Doppler showed EF of 50% to 55%, low normal global left ventricular systolic function. The study is adequate to evaluate wall motion or diastolic function.   H and H is 11 and 35.7. White count is 9.1. Basic metabolic panel within normal limits. Cardiac enzymes negative. B-type natriuretic peptide was 222.   CT angiography of the chest: Negative for PE or any other acute abnormality.   HISTORY OF PRESENT ILLNESS: [name] is a 69 year old Caucasian female who has history of A-fib, on sotalol, who comes in with chest pain and shortness of breath. She was found to be in:  1.  Rapid A-fib with RVR. She was started on short-acting Cardizem, which was changed to p.o. long-acting 120 mg. She is back in sinus rhythm. Her Xarelto and sotalol were continued. Dr. Aundra Dubin saw the patient and recommended above treatment.  2.  Shortness of  breath that was worsening with exertion along with tachycardia. She did not have any obvious evidence of CHF; however, the patient was given Lasix. She felt better, diuresed well, and hence Dr. Aundra Dubin recommends putting her on Lasix 20 mg daily with KCL. She will get outpatient stress test next week per Dr. Fletcher Anon.  3.  Anxiety and depression. Continued home medications.  4.  Gastroesophageal reflux disease. On PPI.   Hospital stay otherwise remained stable. The patient remained a FULL code.   TIME SPENT: 40 minutes. ____________________________ Hart Rochester Posey Pronto, MD sap:sb D: 03/15/2014 14:25:21 ET T: 03/15/2014 16:13:05 ET JOB#: [id number]  cc: Layaan Mott A. Posey Pronto, MD, <Dictator> Pervis Hocking, NP [facility]) Mertie Clause. Fletcher Anon, MD Larey Dresser, MD  Ilda Basset MD ELECTRONICALLY SIGNED 03/18/2014 10:44

## 2015-04-22 NOTE — H&P (Signed)
PATIENT NAME:  Brandy Armstrong, Brandy Armstrong MR#:  [id number] DATE OF BIRTH:  Mar 17, 1946  DATE OF ADMISSION:  08/12/2014  REFERRING PHYSICIAN: Dr. Reita Cliche.   PRIMARY CARE PHYSICIAN: Raquel Rey.  CARDIOLOGY: Dr. Fletcher Anon.    CHIEF COMPLAINT: Palpitations.   HISTORY OF PRESENT ILLNESS:  This is a 69 year old Caucasian female with past medical history of atrial fibrillation, history of ablation back in 2013, arthritis, obesity, presenting with palpitations as well as associated shortness of breath, mainly dyspnea on exertion. Denies any chest pain, fever, chills, orthopnea, or lower extremity edema. On arrival to the Emergency Department noted to be in atrial fibrillation with RVR 140 to 150.   REVIEW OF SYSTEMS: CONSTITUTIONAL: Denies fevers, chills, fatigue.  EYES: Denies blurry vision, double vision, eye pain.  EARS, NOSE AND THROAT: Denies tinnitus, hearing pain or hearing loss. RESPIRATORY: Denies cough or wheeze. Positive for shortness of breath as described above.  CARDIOVASCULAR: Denies chest pain or edema. Positive for palpitations.  GASTROINTESTINAL: Denies nausea, vomiting, diarrhea, abdominal pain. GENITOURINARY: Denies dysuria, hematuria.  ENDOCRINE: Denies nocturia or thyroid problems.  HEMATOLOGIC AND LYMPHATIC: Denies easy bleeding. Positive for bruising, as she is on Xarelto.  SKIN: Denies rashes or lesions.  MUSCULOSKELETAL: Denies pain in neck, back, shoulder, knees, hips or arthritic symptoms.  NEUROLOGIC: Denies paralysis or paresthesias.   PSYCHIATRIC:  Denies anxiety or depressive symptoms.   Otherwise, full review of systems performed by me is negative.   PAST MEDICAL HISTORY: Atrial fibrillation, status post ablation 2013, gastroesophageal reflux disease, rheumatoid arthritis, COPD,  recently had pulmonary function tests completed just this week states she had mild obstructive pulmonary disease.   SOCIAL HISTORY: Remote tobacco use. Denies any alcohol or drug use.   FAMILY HISTORY:  Denies any known cardiovascular or pulmonary disorders.   ALLERGIES: CIPRO, CODEINE, LATEX.   HOME MEDICATIONS: Prednisone 5 mg which she is able to take up to 4 times daily however, she states she is not taking this, Sotalol 120 mg p.o. b.i.d., Xarelto 20 mg p.o. q. daily, Klonopin 0.5 mg p.o. 3 times daily, Lexapro 20 mg p.o. q. daily, Orencia 125 mg/mL 1 mL subcutaneous weekly on Sundays, Nexium 40 mg p.o. b.i.d.   PHYSICAL EXAMINATION:  VITAL SIGNS: Temperature 98.1, heart rate 137, respirations 20, blood pressure 147/90, saturating 96% room air. Weight 92.5 kg, BMI 32.9.  GENERAL: Well-nourished, well-developed Caucasian female currently in no acute distress.  HEAD: Normocephalic, atraumatic.  EYES: Pupils equal, round, reactive to light. Extraocular muscles intact. No scleral icterus.  MOUTH: Moist mucous membranes. Dentition intact. No abscess noted.  EAR, NOSE, THROAT: Clear without exudates. No external lesions.  NECK: Supple. No thyromegaly. No nodules. No JVD.  PULMONARY: Clear to auscultation bilaterally. No wheezes, rales or rhonchi. No accessory muscle use. Good respiratory effort.   CHEST: Nontender to palpation.  CARDIOVASCULAR: S1, S2, irregular rate, irregular rhythm. No murmurs, rubs, or gallops. No edema. Pedal pulses 2+ bilaterally. GASTROINTESTINAL: Soft, nontender, nondistended. No masses, positive bowel sounds. No hepatosplenomegaly.   MUSCULOSKELETAL: No swelling, clubbing, edema. Range of motion full in all extremities.  NEUROLOGIC: Cranial nerves II-XII are intact. No gross neurological deficits. Sensation is intact. Reflexes are intact.  SKIN: No ulcers, lesions, rashes or cyanosis.  Skin warm and dry. Turgor is intact.  PSYCHIATRIC: Mood and affect within normal limits.  Patient is awake, alert and oriented  x 3. Insight and judgment intact.    LABORATORY DATA: EKG performed revealing atrial fibrillation with RVR. Heart rate in the 140s. Chest  x-ray performed  reveals mild bibasilar atelectasis. Remainder of laboratory data: Sodium 143, potassium 3.1, chloride 108, bicarbonate 22, BUN 11, creatinine 0.9, glucose 143. LFTs, albumin 2.9, alkaline phosphatase 124, AST 56, otherwise within normal limits. WBC 10, hemoglobin 11.2, platelets of 237,000.   ASSESSMENT AND PLAN: A 69 year old Caucasian with history of atrial fibrillation, presenting with palpitations.  1. Atrial fibrillation with rapid ventricular response. Our goal rate controlled would be a heart rate less than 120. We will initiate Cardizem now 10 mg intravenous x 1 and start Cardizem drip at 5 mg an hour. Continues her Xarelto for anticoagulation. We will consult cardiology. She follows with Dr. Fletcher Anon.  2. Hypokalemia. Replace potassium, goal of 4 to 5.  3. Gastroesophageal reflux disease.continue proton pump inhibitor 4. Chronic obstructive pulmonary disease. Continue p.r.n. nebulizers for shortness of breath.  5. Venous thromboembolism prophylaxis with Xarelto.   CODE STATUS: Patient is full code.   CRITICAL CARE TIME SPENT: 45 minutes.    ____________________________ Aaron Mose. Hower, MD dkh:JT D: 08/12/2014 21:28:43 ET T: 08/12/2014 23:31:49 ET JOB#: [id number]  cc: Aaron Mose. Hower, MD, <Dictator> DAVID Woodfin Ganja MD ELECTRONICALLY SIGNED 08/13/2014 1:54

## 2015-04-22 NOTE — H&P (Signed)
PATIENT NAME:  Brandy Armstrong, HASLEY MR#:  [id number] DATE OF BIRTH:  25-Nov-1946  DATE OF ADMISSION:  03/11/2014  PRIMARY CARE PHYSICIAN:  Dr. Grier Mitts Rey at [facility].   CARDIOLOGIST: Dr. Fletcher Anon.   CHIEF COMPLAINT: Chest pain, shortness of breath.   HISTORY OF PRESENT ILLNESS: The patient is a 69 year old female with a known history of A. fib, status post ablation at [facility] in February 2013, is being admitted for symptomatic rapid A. fib. The patient has been feeling more and more short of breath for the last several weeks. She felt like she was back in A. fib  this morning and called her primary, who requested her to see Dr. Fletcher Anon today and got an appointment late in the afternoon, who requested her to come to the Emergency Department for further evaluation. While in the ED, she was still in rapid A. fib, as fast as up to 120s, and is being admitted for further evaluation and management. The patient was feeling dizzy and was also having chest pressure substernally. Her dyspnea was mainly on exertion but has been gradually getting worse to the point she cannot manage at home.   PAST MEDICAL HISTORY: 1.  Atrial fibrillation, status post ablation in 2013 at [facility].  2.  Anxiety/depression. 3.  GERD. 4.  Rheumatoid arthritis.   PAST SURGICAL HISTORY: 1.  Cholecystectomy. 2.  Hysterectomy.   ALLERGIES: CIPRO, CODEINE, AND LATEX.   SOCIAL HISTORY: Former smoker, quit about 2 years ago. Occasional alcohol. No IV drugs of abuse.   FAMILY HISTORY: Positive for depression in her mother.   MEDICATIONS AT HOME: 1.  Klonopin 0.5 mg p.o. b.i.d.  2.  Lexapro 20 mg p.o. daily.  3.  Nexium 40 mg p.o. b.i.d.  4.  Orencia 1 mL subcutaneous once a week on Sunday.  5.  Prednisone 5 mg p.o. 4 times a day as needed.  6.  Sotalol 120 mg p.o. b.i.d.  7.  Xarelto 20 mg p.o. daily.   REVIEW OF SYSTEMS:  CONSTITUTIONAL: No fever, fatigue or weakness. EYES: No blurred or double vision.  EARS,  NOSE, THROAT: No tinnitus or ear pain.  RESPIRATORY: No cough, wheezing, hemoptysis. Positive for shortness of breath.  CARDIOVASCULAR: Chest pressure. Also dyspnea on exertion and dizziness. History of A. fib, status post ablation.  GASTROINTESTINAL: No nausea, vomiting, diarrhea. History of hemorrhoids and reports some fresh blood in the stool time to time.  GENITOURINARY: No dysuria or hematuria.  ENDOCRINE: No polyuria or nocturia. HEMATOLOGIC: No anemia or easy bruising. The patient does report a history of low iron but stopped taking iron tablets, requesting iron level to be checked.  SKIN: No rash or lesion.  MUSCULOSKELETAL: Positive for rheumatoid arthritis requiring prednisone time to time.  NEUROLOGIC: No tingling, numbness, weakness.  PSYCHIATRIC: Positive for anxiety and depression.   PHYSICAL EXAMINATION: VITAL SIGNS: Temperature 98.3, heart rate 97 per minute, respirations 24 per minute, blood pressure 136/82 mmHg. She was saturating 96% on room air. The patient's heart rate was varying anywhere from 90s to 120s in the room.  GENERAL: The patient is a 69 year old female lying in the bed comfortably without any acute distress.  EYES: Pupils equal, round, reactive to light and accommodation. No scleral icterus. Extraocular muscles intact.  HEENT: Head atraumatic, normocephalic. Oropharynx and nasopharynx clear.  NECK: Supple. No jugular venous distention. No thyroid enlargement or tenderness.  LUNGS: Clear to auscultation bilaterally. No wheezing, rales, rhonchi or crepitation.  CARDIOVASCULAR: Irregularly irregular heart sounds,  tachycardic. No murmur, rubs, or gallop.  ABDOMEN: Soft, nontender, nondistended. Bowel sounds present. No organomegaly or mass.  EXTREMITIES: No pedal edema, cyanosis or clubbing.  NEUROLOGICAL: Nonfocal examination. Cranial nerves II through XII intact. Muscle strength 5 out of 5 in all extremities. Sensation intact.  PSYCHIATRIC: The patient is alert  and oriented x 3.  SKIN: No obvious rash, lesion or ulcer.  LABORATORY, DIAGNOSTIC, AND RADIOLOGICAL DATA: Normal BMP. Normal first set of troponins. Normal CBC except white count of 11.3. D-dimer of 1.51.   Chest x-ray showed no acute cardiopulmonary disease.   EKG showed atrial fibrillation with RVR, rate of 116 per minute, no major ST-T wave changes.   IMPRESSION AND PLAN: 1.  Atrial fibrillation with rapid ventricular rate. Will resume her sotalol and Xarelto. Add Cardizem. Obtain 2D echo. Consult cardiology. Will admit her to telemetry.  2.  Shortness of breath worsening with exertion along with tachycardia, worrisome for possible pulmonary embolism,  especially with elevated D-dimer. Will get a CT scan of the chest to rule out pulmonary embolism. This can also be due to congestive heart failure. Will get 2D echo. Consult cardiology. Certainly this all could be symptoms of atrial fibrillation also. 3.  Anxiety/depression. Will continue home medications. 4.  Gastroesophageal reflux disease. Will continue proton pump inhibitor.  CODE STATUS: Full code.  Total time taking care of this patient was 55 minutes. The case was discussed with Dr. Fletcher Anon.   ____________________________ Lucina Mellow. Manuella Ghazi, MD vss:jcm D: 03/11/2014 20:53:11 ET T: 03/11/2014 22:01:18 ET JOB#: [id number]  cc: Eila Runyan S. Manuella Ghazi, MD, <Dictator> Lucina Mellow [facility] MD ELECTRONICALLY SIGNED 03/13/2014 22:59

## 2015-04-22 NOTE — Consult Note (Signed)
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 Fletcher Anon as an add-on.  She had had increased exertional dyspnea for about a month.  She was short of breath carrying a load, walking up steps, or walking about 100 yards.  This was new over a few weeks.  Yesterday, she woke up with a heaviness in her chest and profound dyspnea with any exertion.  Dyspnea was a more marked symptom than the chest heaviness.  She came in to be seen by Dr. Fletcher Anon. She was in atrial fibrillation with rate in the 100s-110s and SBP was in the 90s.  She was sent to the ER and admitted.  CTA chest negative for PE.  Of note, yesterday morning was the first time she had actually noticed that she was in atrial fibrillation again.   she went back into NSR.  She remains in NSR this morning and feels better.  She has not been out of bed.  BP is stable.  Cardiac enzymes negative x 2.  ECG this morning shows NSR with anterior T wave inversions (no prior study to compare).  reviewed her echo done this morning.  Difficult study with no apical windows.  EF 55% with mild LVH, RV not well visualized, no gross valvular abnormalities.  Atrial fibrillation: Paroxysmal.  s/p atrial fibrillation ablation at [facility] in 2/13.  Has been on sotalol and Xarelto. Cardiolite 2013 at [facility] was normal per patient's report. Rheumatoid arthritisGERD with Barrett's esophagusDepressionCholecystectomyHysterectomy Single, prior smoker (quit years ago), retired, lives in [place] No premature CAD All systems reviewed and negative except as per HPI.  cardiac meds:120 mg bid20 mg daily cardiac meds:120 mg bi20 mg daily30 mg every 6 hrs CTA chest: No PE, no PNAEchocardiogram: I reviewed today's echo.  It was a limited study (no apical views).  EF 55-60% with mild LV hypertrophy.  Grossly no valvular abnormalities but valves were not fully interrogated.  RV was not well-visualized. ECG: This am  ECG shows NSR with anterior T wave inversions, QTc 437 K 3.7, creatinine 0.83, troponin negative x 3, D dimer elevated, Mg 1.6, TSH normal, HCT 35.7 HR 70 (NSR), 111/75, 94% on 2L NCNAD, obeseNormalJVP 8-9 cm, no thyromegaly or thyroid nodule.Heart reg S1S2, no murmur or gallop, no carotid bruit, trace ankle edema. CTAB Abdomen: Soft, NT, no HSMNo clubbing/cyanosisA&O x 3Normal affect yo with history of paroxysmal atrial fibrillation was admitted with recurrent atrial fibrillation, profound dyspnea, and chest heaviness. Atrial fibrillation: She is back in NSR this morning.  The question here is whether atrial fibrillation was the cause of her profound dyspnea or if there is some other process going on.  It is interesting that she was very symptomatic but atrial fibrillation was actually never that fast (HR in 100s-110s).  She feels fine at rest today back in NSR. Replete K and MgContinue sotalol, QT interval ok on ECG this morning.  I will consolidate diltiazem to diltiazem CD 120 mg daily. Continue Xarelto. Exertional dyspnea: Noted over the last month, worse yesterday.  Yesterday was the only day she actually noted atrial fibrillation.  Exam is difficult, but she may be a bit volume overloaded.  Possible diastolic CHF as a cause of her dyspnea.  Echo showed normal EF with mild LVH.  I will get a BNP. I will give her Lasix 40 mg IV x 1 today to see if it helps her symptoms. She needs to get  up and walk around today .Chest heaviness: Troponin negative x 3.  Chest heaviness could be related to volume overload/diastolic CHF.  It is possible also that her symptoms could be ischemic diastolic dysfunction . I think that she needs a stress Cardiolite.  Will see how she is feeling tomorrow.   I would keep her until tomorrow to make sure she does not immediately go back into atrial fibrillation with RVR and to give her some diuresis.  We can either keep her until Monday and do an inpatient Cardiolite or discharge her  for outpatient study.  Can decide based on her symptoms walking around today.  Aundra Dubin  Electronic Signatures: Larey Dresser (MD)  (Signed on 14-Mar-15 10:52)  Authored  Last Updated: 14-Mar-15 10:52 by Larey Dresser (MD)

## 2015-04-22 NOTE — Consult Note (Signed)
Chief Complaint:  Subjective/Chief Complaint Patient has remained in NSR.  Breathing is improved after IV Lasix.   VITAL SIGNS/ANCILLARY NOTES: **Vital Signs.:   15-Mar-15 04:41  Vital Signs Type Routine  Temperature Temperature (F) 98.1  Celsius 36.7  Temperature Source oral  Pulse Pulse 75  Respirations Respirations 22  Systolic BP Systolic BP 93  Diastolic BP (mmHg) Diastolic BP (mmHg) 51  Mean BP 65  Pulse Ox % Pulse Ox % 92  Pulse Ox Activity Level  At rest  Oxygen Delivery Room Air/ 21 %  *Intake and Output.:   Daily 15-Mar-15 07:00  Grand Totals Intake:  2509 Output:  0865    Net:  -916 24 Hr.:  -916  Oral Intake      In:  1480  IV (Primary)      In:  1029  Urine ml     Out:  3425  Length of Stay Totals Intake:  3559 Output:  7846    Net:  -966   Brief Assessment:  GEN no acute distress   Cardiac Regular  no murmur  -- LE edema  -- JVD  --Gallop   Respiratory normal resp effort  clear BS   Gastrointestinal details normal Soft  Nontender  Nondistended   EXTR negative cyanosis/clubbing   Lab Results: LabObservation:  14-Mar-15 09:38   OBSERVATION Reason for Test  Cardiology:  14-Mar-15 08:09   Ventricular Rate 75  Atrial Rate 75  P-R Interval 148  QRS Duration 88  QT 392  QTc 437  P Axis 74  R Axis 27  T Axis -45  ECG interpretation Normal sinus rhythm ST & T wave abnormality, consider inferior ischemia ST & T wave abnormality, consider anterolateral ischemia Abnormal ECG When compared with ECG of 11-Mar-2014 17:16, Sinus rhythm has replaced Atrial fibrillation Vent. rate has decreased BY  41 BPM ST elevation has replaced ST depression in Lateral leads T wave inversion now evident in Inferior leads T wave inversion less evident in Lateral leads Confirmed by CALLWOOD, DWAYNE ([id number]) on 03/12/2014 9:18:51 AM  Overreader: Lujean Amel    09:38   Echo Doppler REASON FOR EXAM:     COMMENTS:     PROCEDURE: [place] - ECHO DOPPLER  COMPLETE(TRANSTHOR)  - Mar 12 2014  9:38AM   RESULT: Echocardiogram Report  Patient Name:   Brandy Armstrong Date of Exam: 03/12/2014 Medical Rec #:  [id number]              Custom1: Date of Birth:  April 04, 1946            Height:       66.0 in Patient Age:    69 years            Weight:       213.0 lb Patient Gender: F                   BSA:          2.05 m??  Indications: Atrial Fib Sonographer:    Arville Go RDCS Referring Phys: [facility], VIPUL, S  Sonographer Comments: No apical window and Technically challenging study  due to less than ideal echo windows. Limited Doppler study.  Summary:  1. Left ventricular ejection fraction, by visual estimation, is 50 to  55%.  2. Low normal global left ventricular systolic function.  3. The study is inadequate to evaluate wall motion or diastolic function.  4. Mild concentric left ventricular hypertrophy. 2D AND M-MODE MEASUREMENTS (normal ranges  within parentheses): Left Ventricle:          Normal IVSd (2D):      1.27 cm (0.7-1.1) LVPWd (2D):     1.34 cm (0.7-1.1) Aorta/LA:                  Normal LVIDd (2D):     4.84 cm (3.4-5.7) Aortic Root (2D): 3.20 cm (2.4-3.7) LVIDs (2D):     3.61 cm           Left Atrium (2D): 3.50 cm (1.9-4.0) LV FS (2D):     25.4 %   (>25%) LV EF (2D):     50.0 %   (>50%)                                   Right Ventricle:                                   RVd (2D): SPECTRAL DOPPLER ANALYSIS (where applicable): LVOT Vmax: 0.95 m/s LVOT VTI:  LVOT Diameter: 2.30 cm Pulmonic Valve: PV Max Velocity: 1.05 m/s PV Max PG: 4.4 mmHg PV Mean PG: Pulmonic Insufficiency: PI EDV: 1.11 m/s PADP:  PHYSICIAN INTERPRETATION: Left Ventricle: The left ventricular internal cavity size was normal.   Mild concentric left ventricular hypertrophy. Global LV systolic function  was low normal. Left ventricular ejection fraction, by visual estimation,  is 50 to 55%. LV Diastology was not fully evaluated. Right Ventricle: Normal right  ventricular size, wall thickness, and  systolic function. Left Atrium: The left atrium is normal in size and structure. Right Atrium: The right atrium is normal in size and structure. Pericardium: There is no evidence of pericardial effusion. Mitral Valve: The mitral valve is normal in structure. Trace mitral valve  regurgitation is seen. Tricuspid Valve: The tricuspid valve is not well seen. The tricuspid  valve is normal. Aortic Valve: The aortic valve is trileaflet and structurally normal,  with normal leaflet excursion; without any evidence of aortic stenosis or  insufficiency. The aortic valve is normal. The aortic valve is   structurally normal, with no evidence of sclerosis or stenosis. No  evidence of aortic valve regurgitation is seen. Pulmonic Valve: The pulmonic valve is not well seen. Aorta: The aorta is not well seen. Venous: The pulmonary veins were not well seen. The inferior vena cava  was not well visualized.  [id number] Kathlyn Sacramento MD Electronically signed by [id number] Kathlyn Sacramento MD Signature Date/Time: 03/12/2014/11:09:23 AM  *** Final ***  IMPRESSION: .  Verified By: Mertie Clause. ARIDA, M.D., MD  Routine Chem:  14-Mar-15 01:58   Glucose, Serum 80  BUN 14  Creatinine (comp) 0.83  Sodium, Serum 138  Potassium, Serum 3.7  Chloride, Serum 107  CO2, Serum 26  Calcium (Total), Serum  8.4  Anion Gap  5  Osmolality (calc) 275  eGFR (African American) >60  eGFR (Non-African American) >60 (eGFR values <6m/min/1.73 m2 may be an indication of chronic kidney disease (CKD). Calculated eGFR is useful in patients with stable renal function. The eGFR calculation will not be reliable in acutely ill patients when serum creatinine is changing rapidly. It is not useful in  patients on dialysis. The eGFR calculation may not be applicable to patients at the low and high extremes of body sizes, pregnant women, and vegetarians.)  B-Type Natriuretic Peptide ([facility])  222  (  Result(s) reported on 12 Mar 2014 at 11:06AM.)  Magnesium, Serum  1.6 (1.8-2.4 THERAPEUTIC RANGE: 4-7 mg/dL TOXIC: > 10 mg/dL  -----------------------)  15-Mar-15 03:54   Glucose, Serum  108  BUN 10  Creatinine (comp) 0.82  Sodium, Serum 137  Potassium, Serum 4.1  Chloride, Serum 105  CO2, Serum 29  Calcium (Total), Serum 9.0  Anion Gap  3  Osmolality (calc) 273  eGFR (African American) >60  eGFR (Non-African American) >60 (eGFR values <37m/min/1.73 m2 may be an indication of chronic kidney disease (CKD). Calculated eGFR is useful in patients with stable renal function. The eGFR calculation will not be reliable in acutely ill patients when serum creatinine is changing rapidly. It is not useful in  patients on dialysis. The eGFR calculation may not be applicable to patients at the low and high extremes of body sizes, pregnant women, and vegetarians.)  Cardiac:  14-Mar-15 01:58   Troponin I < 0.02 (0.00-0.05 0.05 ng/mL or less: NEGATIVE  Repeat testing in 3-6 hrs  if clinically indicated. >0.05 ng/mL: POTENTIAL  MYOCARDIAL INJURY. Repeat  testing in 3-6 hrs if  clinically indicated. NOTE: An increase or decrease  of 30% or more on serial  testing suggests a  clinically important change)  Routine Hem:  14-Mar-15 01:58   WBC (CBC) 9.1  RBC (CBC) 4.59  Hemoglobin (CBC)  11.0  Hematocrit (CBC) 35.7  Platelet Count (CBC) 170  MCV  78  MCH  24.0  MCHC  30.9  RDW  16.4  Neutrophil % 48.4  Lymphocyte % 40.5  Monocyte % 8.3  Eosinophil % 2.2  Basophil % 0.6  Neutrophil # 4.4  Lymphocyte #  3.7  Monocyte # 0.8  Eosinophil # 0.2  Basophil # 0.1 (Result(s) reported on 12 Mar 2014 at 02:55AM.)   Assessment/Plan:  Assessment/Plan:  Assessment 69yo with history of paroxysmal atrial fibrillation was admitted with recurrent atrial fibrillation, profound dyspnea, and chest heaviness.  1. Atrial fibrillation: She has remained in NSR since shortly after admission.  The  question here is whether atrial fibrillation was the cause of her profound dyspnea or if there is some other process going on.  It is interesting that she was very symptomatic but atrial fibrillation was actually never that fast (HR in 100s-110s).  She feels fine at rest today back in NSR.  - Continue sotalol, QT interval ok on ECG.   - She can continue diltiazem CD at home. - Continue Xarelto.  2. Exertional dyspnea: Noted over the last month, worse Friday.  Friday was the only day she actually noted atrial fibrillation.  I thought that she had some volume overload.  BNP 222.  Possible diastolic CHF as a cause of her dyspnea.  Echo showed low normal EF with mild LVH.   - She felt better after IV Lasix.  I will send her home on Lasix 20 mg daily with Kdur.  3. Chest heaviness: Troponin negative x 3.  Chest heaviness could be related to volume overload/diastolic CHF.  It is possible also that her symptoms could be ischemic diastolic dysfunction . I think that she needs a stress Cardiolite.  She feels very good today.  I will let her go home and arrange for outpatient Cardiolite.   DLoralie Champagne3/15/14   Electronic Signatures: MLarey Dresser(MD)  (Signed 15-Mar-15 09:30)  Authored: Chief Complaint, VITAL SIGNS/ANCILLARY NOTES, Brief Assessment, Lab Results, Assessment/Plan   Last Updated: 15-Mar-15 09:30 by MLarey Dresser(MD)

## 2015-04-22 NOTE — Discharge Summary (Signed)
Dates of Admission and Diagnosis:  Date of Admission 12-Aug-2014   Date of Discharge 14-Aug-2014   Admitting Diagnosis a fib with RVR   Final Diagnosis A fib with RVR COPD Htn    Chief Complaint/History of Present Illness a 69 year old Caucasian female with past medical history of atrial fibrillation, history of ablation back in 2013, arthritis, obesity, presenting with palpitations as well as associated shortness of breath, mainly dyspnea on exertion. Denies any chest pain, fever, chills, orthopnea, or lower extremity edema. On arrival to the Emergency Department noted to be in atrial fibrillation with RVR 140 to 150.   Allergies:  Codeine: N/V  Cipro: Rash  Latex: Unknown  Hepatic:  14-Aug-15 17:21   Bilirubin, Total 0.3  Alkaline Phosphatase  124 (46-116 NOTE: New Reference Range 07/19/14)  SGPT (ALT) 33 (14-63 NOTE: New Reference Range 07/19/14)  SGOT (AST)  56  Total Protein, Serum 7.6  Albumin, Serum  2.9  Routine Chem:  14-Aug-15 17:21   Glucose, Serum  143  BUN 11  Creatinine (comp) 0.90  Sodium, Serum 143  Potassium, Serum  3.4  Chloride, Serum  108  CO2, Serum 22  Calcium (Total), Serum  8.3  Anion Gap 13  Osmolality (calc) 287  eGFR (African American) >60  eGFR (Non-African American) >60 (eGFR values <15m/min/1.73 m2 may be an indication of chronic kidney disease (CKD). Calculated eGFR is useful in patients with stable renal function. The eGFR calculation will not be reliable in acutely ill patients when serum creatinine is changing rapidly. It is not useful in  patients on dialysis. The eGFR calculation may not be applicable to patients at the low and high extremes of body sizes, pregnant women, and vegetarians.)  B-Type Natriuretic Peptide ([facility]  1960 (Result(s) reported on 12 Aug 2014 at [facility])  15-Aug-15 03:16   Glucose, Serum 99  BUN 9  Creatinine (comp) 0.87  Sodium, Serum 143  Potassium, Serum 3.9  Chloride, Serum  108  CO2, Serum 25   Calcium (Total), Serum  8.0  Anion Gap 10  Osmolality (calc) 284  eGFR (African American) >60  eGFR (Non-African American) >60 (eGFR values <651mmin/1.73 m2 may be an indication of chronic kidney disease (CKD). Calculated eGFR is useful in patients with stable renal function. The eGFR calculation will not be reliable in acutely ill patients when serum creatinine is changing rapidly. It is not useful in  patients on dialysis. The eGFR calculation may not be applicable to patients at the low and high extremes of body sizes, pregnant women, and vegetarians.)  Cardiac:  14-Aug-15 17:21   Troponin I < 0.02 (0.00-0.05 0.05 ng/mL or less: NEGATIVE  Repeat testing in 3-6 hrs  if clinically indicated. >0.05 ng/mL: POTENTIAL  MYOCARDIAL INJURY. Repeat  testing in 3-6 hrs if  clinically indicated. NOTE: An increase or decrease  of 30% or more on serial  testing suggests a  clinically important change)  15-Aug-15 03:16   Troponin I < 0.02 (0.00-0.05 0.05 ng/mL or less: NEGATIVE  Repeat testing in 3-6 hrs  if clinically indicated. >0.05 ng/mL: POTENTIAL  MYOCARDIAL INJURY. Repeat  testing in 3-6 hrs if  clinically indicated. NOTE: An increase or decrease  of 30% or more on serial  testing suggests a  clinically important change)  Routine Hem:  14-Aug-15 17:21   WBC (CBC) 10.0  RBC (CBC) 4.90  Hemoglobin (CBC)  11.2  Hematocrit (CBC) 37.0  Platelet Count (CBC) 237 (Result(s) reported on 12 Aug 2014 at 05:39PM.)  MCV  36  MCH  22.8  MCHC  30.3  RDW  16.9   PERTINENT RADIOLOGY STUDIES: XRay:    14-Aug-15 18:17, Chest Portable Single View  Chest Portable Single View   REASON FOR EXAM:    short of breath  COMMENTS:       PROCEDURE: DXR - DXR PORTABLE CHEST SINGLE VIEW  - Aug 12 2014  6:17PM     CLINICAL DATA:  Chest pain, short of breath    EXAM:  PORTABLE CHEST - 1 VIEW    COMPARISON:  Radiograph 03/11/2014    FINDINGS:  Normal cardiac silhouette. Low lung  volumes and mild basilar  atelectasis. Normal pulmonary vasculature. No infiltrate or  effusion.     IMPRESSION:  Low lung volumes and mild basilar atelectasis      Electronically Signed    By: Suzy Bouchard M.D.    On: 08/12/2014 18:20         Verified By: Rennis Golden, M.D.,    15-Aug-15 11:35, Chest PA and Lateral  Chest PA and Lateral   REASON FOR EXAM:    edema Vs infiltrates.  COMMENTS:       PROCEDURE: DXR - DXR CHEST PA (OR AP) AND LATERAL  - Aug 13 2014 11:35AM     CLINICAL DATA:  Followup pulmonary infiltrates    EXAM:  CHEST  2 VIEW    COMPARISON:  08/12/2014    FINDINGS:  Normal cardiac silhouette. No effusion, infiltrate, or pneumothorax.  Degenerative osteophytosis of the thoracic spine.   IMPRESSION:  No evidence edema or infiltrate.      Electronically Signed    By: Suzy Bouchard M.D.    On: 08/13/2014 11:49         Verified By: Rennis Golden, M.D.,   Pertinent Past History:  Pertinent Past History Atrial fibrillation, status post ablation 2013, gastroesophageal reflux disease, rheumatoid arthritis, COPD,  recently had pulmonary function tests completed just this week states she had mild obstructive pulmonary disease.   Hospital Course:  Hospital Course A 69 year old Caucasian with history of atrial fibrillation, presenting with palpitations.  1. Atrial fibrillation with rapid ventricular response.  Cardizem drip at 5 mg an hour. swich to cardizem 30 mg 6 hrly. converted to sinus rhythm. Xarelto for anticoagulation. appreciated consult cardiology. She follows with Dr. Fletcher Anon.  had ablation done in past. troponins negative. she said she was taking all her meds regularly.   already on sotalol, Dr. Rockey Situ suggested cardiazem oral on d/c.   councelled about drinking controlled amount of total liquids in a day(1500 ml)  2. Hypokalemia. Replaced potassium. 3. Gastroesophageal reflux disease.continue proton pump inhibitor. 4. Chronic  obstructive pulmonary disease. Continue p.r.n. nebulizers for shortness of breath. currently no wheezing. on spiriva and combivent at home. 5. Venous thromboembolism prophylaxis with Xarelto.   Condition on Discharge Stable   Code Status:  Code Status Full Code   DISCHARGE INSTRUCTIONS HOME MEDS:  Medication Reconciliation: Patient's Home Medications at Discharge:     Medication Instructions  xarelto 20 mg oral tablet  1 tab(s) orally once a day (in the evening)   lexapro 20 mg oral tablet  1 tab(s) orally once a day   orencia 125 mg/ml subcutaneous solution  1 milliliter(s) subcutaneous once a week on Sunday   nexium 40 mg oral delayed release capsule  1 cap(s) orally 2 times a day   prednisone 5 mg oral tablet  1 tab(s) orally up to 4 times a day, As  Needed for arthritis pain   sotalol 120 mg oral tablet  1 tab(s) orally 2 times a day   klonopin 0.5 mg oral tablet  1 tab(s) orally 3 times a day   diltiazem 30 mg oral tablet  1 tab(s) orally every 8 hours   xopenex hfa cfc free 45 mcg/inh inhalation aerosol  2 puff(s) inhaled every 4 hours, As Needed - for Shortness of Breath   tiotropium 18 mcg inhalation capsule  1 cap(s) inhaled once a day   furosemide 20 mg oral tablet  1 tab(s) orally once a day     Physician's Instructions:  Diet Low Sodium   Activity Limitations As tolerated   Return to Work Not Applicable   Time frame for Follow Up Appointment 1-2 weeks  Cardiology   Other Comments follows at [facility].     Gollan, Timothy(Consultant): [facility], [address], [address], , [place] [address], [phone]  Electronic Signatures: ,  (MD)  (Signed 18-Aug-15 21:29)  Authored: ADMISSION DATE AND DIAGNOSIS, CHIEF COMPLAINT/HPI, Allergies, PERTINENT LABS, PERTINENT RADIOLOGY STUDIES, PERTINENT PAST HISTORY, HOSPITAL COURSE, DISCHARGE INSTRUCTIONS HOME MEDS, PATIENT INSTRUCTIONS, Follow Up Physician   Last  Updated: 18-Aug-15 21:29 by ,  (MD) 

## 2015-04-24 DIAGNOSIS — I480 Paroxysmal atrial fibrillation: Secondary | ICD-10-CM | POA: Diagnosis not present

## 2015-04-24 DIAGNOSIS — Z79899 Other long term (current) drug therapy: Secondary | ICD-10-CM | POA: Diagnosis not present

## 2015-04-28 ENCOUNTER — Ambulatory Visit (INDEPENDENT_AMBULATORY_CARE_PROVIDER_SITE_OTHER): Payer: Medicare Other | Admitting: Nurse Practitioner

## 2015-04-28 ENCOUNTER — Encounter: Payer: Self-pay | Admitting: Nurse Practitioner

## 2015-04-28 VITALS — BP 108/64 | HR 81 | Temp 97.4°F | Resp 14 | Ht 66.25 in | Wt 200.1 lb

## 2015-04-28 DIAGNOSIS — N644 Mastodynia: Secondary | ICD-10-CM

## 2015-04-28 MED ORDER — CLONAZEPAM 0.5 MG PO TABS
0.5000 mg | ORAL_TABLET | Freq: Three times a day (TID) | ORAL | Status: DC | PRN
Start: 2015-04-28 — End: 2015-06-14

## 2015-04-28 MED ORDER — GABAPENTIN 300 MG PO CAPS: 600.0000 mg | ORAL_CAPSULE | Freq: Every day | ORAL | Status: DC

## 2015-04-28 MED ORDER — DOXYCYCLINE HYCLATE 100 MG PO CAPS: 100.0000 mg | ORAL_CAPSULE | Freq: Two times a day (BID) | ORAL | Status: DC

## 2015-04-28 MED ORDER — OMEPRAZOLE 40 MG PO CPDR: 40.0000 mg | DELAYED_RELEASE_CAPSULE | Freq: Every day | ORAL | Status: DC

## 2015-04-28 NOTE — Patient Instructions (Signed)
Follow-up in 2 weeks

## 2015-04-28 NOTE — Progress Notes (Signed)
Pre visit review using our clinic review tool, if applicable. No additional management support is needed unless otherwise documented below in the visit note. 

## 2015-04-28 NOTE — Progress Notes (Signed)
   Subjective:    Patient ID: Brandy Armstrong, female    DOB: 1946/07/25, 69 y.o.   MRN: [id number]  HPI  [name] is a 69 yo female with a CC of breast tenderness.  1) Right breast tenderness x 2 weeks, 2 spots under the breast  No draining, improving she reports she almost did not come No itching   2) Heart-Cardioversion didn't work, amioderone working Down 15 lbs   Review of Systems     Objective:   Physical Exam  Constitutional: She is oriented to person, place, and time. She appears well-developed and well-nourished. No distress.  BP 108/64 mmHg  Pulse 81  Temp(Src) 97.4 F (36.3 C) (Oral)  Resp 14  Ht 5' 6.25" (1.683 m)  Wt 200 lb 1.9 oz (90.774 kg)  BMI 32.05 kg/m2  SpO2 95%   HENT:  Head: Normocephalic and atraumatic.  Right Ear: External ear normal.  Left Ear: External ear normal.  Cardiovascular: Normal rate, regular rhythm, normal heart sounds and intact distal pulses.  Exam reveals no gallop and no friction rub.   No murmur heard. Pulmonary/Chest: Effort normal and breath sounds normal. No respiratory distress. She has no wheezes. She has no rales. She exhibits no tenderness.    Neurological: She is alert and oriented to person, place, and time. No cranial nerve deficit. She exhibits normal muscle tone. Coordination normal.  Skin: Skin is warm and dry. No rash noted. She is not diaphoretic.  Psychiatric: She has a normal mood and affect. Her behavior is normal. Judgment and thought content normal.      Assessment & Plan:

## 2015-04-30 NOTE — Consult Note (Signed)
General Aspect Primary Cardiologist: Dr. Rockey Situ, MD ___________________  69 year old female with history of PAF s/p ablation at [facility] 2013 currently on Sotalol and Xarelto, recently diagnosed COPD, RA, GERD, Barretts esophagus, depression, and anxiety who presented to [facility] on 01/02/2015 with a-fib with RVR with rates into the 150s.  __________________   PMH:  1. PAF s/p ablation at [facility] 2013 currently on Sotalol and Xarelto 2. Recently diagnosed COPD 3. RA 4. GERD 5. Barretts esophagus 6. Depression 7. Anxiety ____________________   Present Illness 69 year old female with the above problem list who presented to [facility] on 1/4 with a-fib with RVR with rates into the 150s.  Patient with PAF s/p catheter ablation in 2013 at [facility]. This is her 3rd admission since March 2015 for a-fib with RVR. In March 2015 she presented with increased exertional dyspnea after restablishing with our practice and was found to be in a-fib with RVR upon her arrival. CTA of chest negative for PE. Echo with difficult windows, EF 55% with mild LVH, RV not well visualized, no gross valvular abnormalities. She had converted back to NSR prior to cardiology seeing her. She was continued on her Soltalol, diltiazem was consolidated into 120 mg daily, and Xarelto was continued. pBNP 222. She underwent outpatient Lexiscan 03/17/2014 that showed no ischemia with an EF 50%. She was again admitted in the summer of 2015 with a-fib with RVR and placed on diltiazem and converted to NSR. She was discharged on diltiazem 30 mg bid. Following that discharge, she notes frequent paroxysms of irregular heart beats and occasional tachypalpitations.?? She had recurrent tachypalps on 8/31 and took her diltiazem as scheduled.?? She drove herself to the [facility] ED and by the time she got there, she says that her rate was well controlled but she thinks she was still in afib. Labs and cxr were unrevealing and she was discharged from the ED. Since then, she has  continued to note irregular heart beats, and thought she was in afib. She does think that overall, palpitations have improved some since starting on diltiazem. She reportedly had EP follow up at [facility] in October, however it does not appear this occured per [facility].   She has chronic DOE.?? She does not always take her lasix as prescribed b/c she says that it makes her feel like she has to go but when she sits down to use the toilet, not much comes out. She was recently diagnosed with COPD and started on Symbicort and Spiriva.   She presents now with increased SOB and fatigue over the past couple of days. No chest pain, diaphoresis, nausea, vomiting, presyncope, or syncope. Upon her arrival to [facility] she was found to be in a-fib with RVR with rates in the 150s. No recently colds. She did recently have thrush that is resolved 2/2 to her inhalers. She received IV diltiazem which dropped her BP into the 90s/60s. She was then started on digoxin with rate improvement into the 110s. Troponin negative x 1, K+ 3.6-->3.9, TSH 1.67, Mg 1.9, hgb 10.2. CXR with no acute findings.   Physical Exam:  GEN well developed, well nourished, no acute distress   HEENT hearing intact to voice, moist oral mucosa   NECK supple   RESP normal resp effort  clear BS   CARD Irregular rate and rhythm  Tachycardic  No murmur   ABD denies tenderness   LYMPH negative neck   EXTR negative edema   SKIN normal to palpation  NEURO cranial nerves intact, motor/sensory function intact   PSYCH alert   Review of Systems:  Subjective/Chief Complaint SOB   General: Fatigue  Weakness   Skin: No Complaints   ENT: No Complaints   Eyes: No Complaints   Neck: No Complaints   Respiratory: Short of breath   Cardiovascular: Palpitations  Dyspnea   Gastrointestinal: No Complaints   Genitourinary: No Complaints   Vascular: No Complaints   Musculoskeletal: No Complaints   Neurologic: No Complaints    Hematologic: No Complaints   Endocrine: No Complaints   Psychiatric: No Complaints   Review of Systems: All other systems were reviewed and found to be negative   Medications/Allergies Reviewed Medications/Allergies reviewed   Family & Social History:  Family and Social History:  Family History Negative  Diabetes Mellitus  Cancer  depression   Social History positive  tobacco, positive ETOH, negative Illicit drugs   + Tobacco Prior (greater than 1 year)  40 pack years   Place of Living Home     cardiac ablation:    Anxiety: Severe   GERD - Esophageal Reflux:    Depression:    Afib:    Rheumatoid Arthritis:    Cholecystectomy:    Hysterectomy - Total:          Admit Diagnosis:   AFIB RVR ATRIAL FIBRILLATION WITH RAPID: Onset Date: 03-Jan-2015, Status: Active, Description: AFIB RVR ATRIAL FIBRILLATION WITH RAPID  Home Medications: Medication Instructions Status  Xarelto 20 mg oral tablet 1 tab(s) orally once a day (in the evening) Active  predniSONE 5 mg oral tablet 1 tab(s) orally 4 times a day, As Needed for arthritis pain.  Active  sotalol 120 mg oral tablet 1 tab(s) orally 2 times a day Active  clonazePAM 0.5 mg oral tablet 1 tab(s) orally 3 times a day Active  escitalopram 20 mg oral tablet 1 tab(s) orally once a day Active  NexIUM 40 mg oral delayed release capsule 1 cap(s) orally once a day Active  Spiriva Respimat 2 puff(s) inhaled once a day Active  Symbicort 160 mcg-4.5 mcg/inh inhalation aerosol 2 puff(s) inhaled 2 times a day Active  hydroxychloroquine 200 mg oral tablet 1 tab(s) orally 2 times a day Active  Arava 20 mg oral tablet 1 tab(s) orally once a day Active  diltiazem 30 mg oral tablet 1 tab(s) orally every 8 hours, As Needed for heart palpitations.  Active  Xopenex HFA CFC free 45 mcg/inh inhalation aerosol 2 puff(s) inhaled every 4 hours, As Needed - for Shortness of Breath Active   Lab Results:  Thyroid:  05-Jan-16 05:47   Thyroid  Stimulating Hormone 1.67 (0.45-4.50 (IU = International Unit)  ----------------------- Pregnant patients have  different reference  ranges for TSH:  - - - - - - - - - -  Pregnant, first trimetser:  0.36 - 2.50 uIU/mL)  Routine Chem:  04-Jan-16 15:45   B-Type Natriuretic Peptide [facility])  1457 (Result(s) reported on 02 Jan 2015 at 04:28PM.)  05-Jan-16 05:47   Glucose, Serum 82  BUN 10  Creatinine (comp) 0.84  Sodium, Serum 138  Potassium, Serum 3.9  Chloride, Serum 107  CO2, Serum 24  Calcium (Total), Serum  8.1  Anion Gap 7  Osmolality (calc) 274  eGFR (African American) >60  eGFR (Non-African American) >60 (eGFR values <35m/min/1.73 m2 may be an indication of chronic kidney disease (CKD). Calculated eGFR, using the MRDR Study equation, is useful in  patients with stable renal function. The eGFR calculation will not  be reliable in acutely ill patients when serum creatinine is changing rapidly. It is not useful in patients on dialysis. The eGFR calculation may not be applicable to patients at the low and high extremes of body sizes, pregnant women, and vegetarians.)  Magnesium, Serum 1.9 (1.8-2.4 THERAPEUTIC RANGE: 4-7 mg/dL TOXIC: > 10 mg/dL  -----------------------)  Cardiac:  04-Jan-16 15:45   Troponin I < 0.02 (0.00-0.05 0.05 ng/mL or less: NEGATIVE  Repeat testing in 3-6 hrs  if clinically indicated. >0.05 ng/mL: POTENTIAL  MYOCARDIAL INJURY. Repeat  testing in 3-6 hrs if  clinically indicated. NOTE: An increase or decrease  of 30% or more on serial  testing suggests a  clinically important change)  Routine Hem:  04-Jan-16 15:45   Hemoglobin (CBC)  10.2  05-Jan-16 05:47   WBC (CBC) 8.9  RBC (CBC) 4.20  Hemoglobin (CBC)  9.4  Hematocrit (CBC)  30.3  Platelet Count (CBC) 180  MCV  72  MCH  22.3  MCHC  31.0  RDW  17.5  Neutrophil % 61.9  Lymphocyte % 29.2  Monocyte % 6.7  Eosinophil % 1.8  Basophil % 0.4  Neutrophil # 5.5  Lymphocyte # 2.6   Monocyte # 0.6  Eosinophil # 0.2  Basophil # 0.0 (Result(s) reported on 03 Jan 2015 at 06:31AM.)   EKG:  EKG Interp. by me   Interpretation EKG shows a-fib, 100, nonspecific st/t changes   Radiology Results: XRay:    04-Jan-16 18:10, Chest Portable Single View  Chest Portable Single View   REASON FOR EXAM:    palpitations  COMMENTS:       PROCEDURE: DXR - DXR PORTABLE CHEST SINGLE VIEW  - Jan 02 2015  6:10PM     CLINICAL DATA:  Initial encounter for heart palpitations.    EXAM:  PORTABLE CHEST - 1 VIEW    COMPARISON:  08/29/2014    FINDINGS:  1809 hrs The lungs are clear without focal infiltrate, edema,  pneumothorax or pleural effusion. Patient is rotated to the right,  displacing cardio mediastinal anatomy over the medial right hemi  thorax. Cardiopericardial silhouette is at upper limits of normal  for size. Imaged bony structures of the thorax are intact. Telemetry  leads overlie the chest.     IMPRESSION:  Stable.  No acute cardiopulmonary findings.      Electronically Signed    By: Misty Stanley M.D.    On: 01/02/2015 18:25         Verified By: ERIC A. MANSELL, M.D.,    Codeine: N/V  Cipro: Rash  Latex: Unknown  Vital Signs/Nurse's Notes: **Vital Signs.:   05-Jan-16 08:43  Vital Signs Type Pre Medication  Pulse Pulse 093  Systolic BP Systolic BP 267  Diastolic BP (mmHg) Diastolic BP (mmHg) 68  Mean BP 83    Impression 69 year old female with history of PAF s/p ablation at [facility] 2013 currently on Sotalol and Xarelto, recently diagnosed COPD, RA, GERD, Barretts esophagus, depression, and anxiety who presented to [facility] on 01/02/2015 with a-fib with RVR with rates into the 150s.   1. A-fib with RVR: -Rates improved to low 100s with sotalol, diltiazem, and newly added low dose digoxin -Have patient ambulate throughout the day today to gauge her symptoms and HR -Schedule cardiovsion for 01/04/2015 (on Xarelto and has not missed a dose) -If a-fib with RVR  continues to break through may need second ablation in the future - would have patient be seen by EP as an outpatient (  it does not look like she was seen by [facility] EP in the fall of 2015) -Check digoxin level -SCr normal  2. COPD: -No signs of current exacerbation -Continue current regimen  3. RA: -Some hand pain copntinue outpt meds -Follow  4.  GERD COntinue PPI   Electronic Signatures: Rise Mu (PA-C)  (Signed 05-Jan-16 11:38)  Authored: General Aspect/Present Illness, History and Physical Exam, Review of System, Family & Social History, Past Medical History, Home Medications, Labs, EKG , Radiology, Allergies, Vital Signs/Nurse's Notes, Impression/Plan Ida Rogue (MD)  (Signed 05-Jan-16 20:40)  Authored: General Aspect/Present Illness, History and Physical Exam, Review of System, Family & Social History, Health Issues, EKG , Impression/Plan  Co-Signer: General Aspect/Present Illness, History and Physical Exam, Review of System, Family & Social History, Past Medical History, Home Medications, Labs, EKG , Radiology, Allergies, Vital Signs/Nurse's Notes, Impression/Plan   Last Updated: 05-Jan-16 20:40 by Ida Rogue (MD)

## 2015-04-30 NOTE — H&P (Signed)
PATIENT NAME:  Brandy Armstrong, HERBOLD MR#:  [id number] DATE OF BIRTH:  29-May-1946  DATE OF ADMISSION:  01/02/2015  PRIMARY CARE PHYSICIAN:  Charolette Forward, FNP   PRIMARY CARDIOLOGIST: At [facility].   REFERRING EMERGENCY ROOM PHYSICIAN:  Garnette Scheuermann, MD   CHIEF COMPLAINT: Palpitations.   HISTORY OF PRESENT ILLNESS: This 69 year old woman with known history of atrial fibrillation, rheumatoid arthritis, and COPD presents today due to palpitations this morning. She reports that when her atrial fibrillation flares up she listens to her heart with the stethoscope. This morning, she had a funny feeling in her chest and began to feel lightheaded. She listened to her heart with a stethoscope and it was so fast she could not really hear distinct beats. She states for the past few days she has been feeling shaky in her fingers. She has had a strange feeling across her chest and upper back like muscle strain. Her legs have been getting tired easily when walking and she has had a persistent headache. On presentation to the Emergency Room, her heart rate was in the 130s to 140s. She was given IV diltiazem at which time her blood pressure dropped into the 90s/60s. Dr. Jacqualine Code consulted Dr. Fletcher Anon for further recommendations on treatment of her atrial fibrillation with RVR and they decided to start digoxin. She is being admitted tonight to start digoxin and for cardiac monitoring during this process.   PAST MEDICAL HISTORY:  1.  Atrial fibrillation.  2.  Rheumatoid arthritis.  3.  COPD.  4.  Gastroesophageal reflux disease.  5.  Depression and anxiety.   PAST SURGICAL HISTORY: 1.  Cardiac ablation.  2.  Cholecystectomy.  3.  Total abdominal hysterectomy.   SOCIAL HISTORY: She lives alone. She quit smoking 3 years ago, prior to that she was a heavy smoker for many years. She does not drink alcohol. She does not use a cane or walker. No illicit substance abuse.   FAMILY MEDICAL HISTORY: Negative for coronary artery disease or  stroke.   ALLERGIES: SHE IS ALLERGIC TO CODEINE, CIPROFLOXACIN AND LATEX.   HOME MEDICATIONS: 1.  Xopenex HFA CFC free 45 mcg/inhalations 2 puffs inhaled every 4 hours as needed for shortness of breath.  2.  Xarelto 20 mg 1 tablet once a day in the evening.  3.  Symbicort 160 mcg/4.5 mcg/inhalation 2 puffs inhaled twice a day.  4.  Spiriva Respimat 2 puffs inhaled daily.  5.  Sotalol 120 mg 1 tablet twice a day.  6.  Prednisone 5 mg 1 tablet 4 times a day as needed for arthritis.  7.  Nexium 40 mg 1 capsule once a day.  8.  Hydroxychloroquine 200 mg 1 tablet twice a day.  9.  Escitalopram 20 mg 1 tablet once a day.  10.  Diltiazem 30 mg 1 tablet every 8 hours as needed for palpitations.  11.  Clonazepam 0.5 mg 1 tablet 3 times a day.  12.  Arava 120 mg 1 tablet once a day.   REVIEW OF SYSTEMS: CONSTITUTIONAL: Negative for fevers, chills, weakness, change in weight or pain.  HEENT: No change in vision or hearing. No pain in the eyes or ears. No sore throat, sinus congestion or drainage. No difficulty swallowing.  RESPIRATORY: No shortness of breath, wheezing, cough, hemoptysis. She does have increasing dyspnea with exertion.  CARDIOVASCULAR: Positive for palpitations, no orthopnea, edema, or syncope.  GASTROINTESTINAL: No nausea, vomiting, diarrhea, or abdominal pain.  GENITOURINARY: No dysuria or frequency.  HEMATOLOGIC: No easy  bruising or bleeding noted.  MUSCULOSKELETAL: No new pain in the neck, back, knees, shoulders, or hips, she has RA  and does have some chronic pain but has not had a recent flare.  NEUROLOGIC: No focal numbness or weakness. No confusion, seizure, headache, CVA, or memory loss.  PSYCHIATRIC: No uncontrolled depression or anxiety, no bipolar disorder or schizophrenia.   PHYSICAL EXAMINATION: VITAL SIGNS: Temperature 98.1, pulse 135, respirations 25, blood pressure 124/75, oxygenation 98% on room air.  GENERAL: The patient seems anxious, no acute distress.   HEENT: Pupils equal, round, and reactive to light. Conjunctivae are clear, extraocular motion intact. Oral mucous membranes are pink and moist, posterior oropharynx is clear with no exudate, edema, or erythema. Trachea is midline; thyroid nontender, no cervical lymphadenopathy.  RESPIRATORY: Lungs clear to auscultation bilaterally with good air movement.  CARDIOVASCULAR: Irregular, tachycardic; no murmurs, rubs, or gallops, no peripheral edema; peripheral pulses are decreased 1+.  ABDOMEN: Soft, nontender, nondistended. Bowel sounds normal. No guarding, rebound hepatosplenomegaly or mass.  SKIN: No rash, no lesions, no blistering or open wounds.  MUSCULOSKELETAL: No joint effusions, strength is 5/5 throughout, range of motion is normal.  NEUROLOGIC: Cranial nerves II through XII are grossly intact. Strength and sensation are intact; she does have a very pronounced essential tremor which is noted mostly in the hands.  PSYCHIATRIC: The patient is alert and oriented x 4, with good insight into her clinical condition. She is anxious at this time. No signs of depression.   LABORATORY DATA: Sodium 135, potassium 3.6, chloride 102, bicarbonate 23, BUN 12, creatinine 0.93, BNP is 1457, glucose is 128, troponin less than 0.02; white blood cells 9.6, hemoglobin 10.2, platelets 220,000, MCV 73.   IMAGING: Chest x-ray stable, no acute cardiopulmonary findings.   ASSESSMENT AND PLAN: 1.  Atrial fibrillation with rapid ventricular rate: She is already on 120 mg of sotalol twice a day, intravenous diltiazem dropped her blood pressure in the Emergency Room so we will not be using this agent. When I see her, she is still in atrial fibrillation with rapid ventricular rate at 135 beats minute. I have discussed her case with Dr. Fletcher Anon, she has already received 0.125 mg of digoxin in the Emergency Room. We will continue with 0.25 mg every 2 hours x 3. She will be admitted to telemetry for cardiac monitoring. I will check  a TSH, and the magnesium. She is not having any current chest pain.  2.  Chronic obstructive pulmonary disease: No current chronic obstructive pulmonary disease exacerbation. Her oxygenation is excellent. Continue home regimen without any changes.  3.  Rheumatoid arthritis: No evidence of flare at this time. Continue home regimen with no changes.  4.  Gastroesophageal reflux disease: Continue with proton pump inhibitor.  5.  Venous thromboembolism prophylaxis: Continue with Xarelto.   CODE STATUS: The patient is a full code.   TIME SPENT ON ADMISSION:  Was 50 minutes.    ____________________________ Earleen Newport. Volanda Napoleon, MD cpw:nt D: 01/02/2015 20:29:57 ET T: 01/02/2015 20:59:39 ET JOB#: [id number]  cc: Earleen Newport. Volanda Napoleon, MD, <Dictator> Aldean Jewett MD ELECTRONICALLY SIGNED 01/10/2015 13:17

## 2015-04-30 NOTE — Discharge Summary (Signed)
PATIENT NAME:  Brandy Armstrong, Brandy Armstrong MR#:  [id number] DATE OF BIRTH:  [birth year]  DATE OF ADMISSION:  01/02/2015 DATE OF DISCHARGE:  01/04/2015  PRIMARY CARE PHYSICIAN: Charolette Forward, FNP  DISCHARGE DIAGNOSES:  1. Ventricular fibrillation with rapid ventricular response.  2. Chronic obstructive pulmonary disease.  3. Rheumatoid arthritis.  4. Anemia.   PROCEDURE: Cardioversion this morning.   CONDITION: Stable.   CODE STATUS: Full code.   HOME MEDICATIONS: Please refer to the medication reconciliation list.   DIET: Low-sodium, low-fat, low-cholesterol diet.   ACTIVITY: As tolerated.   FOLLOWUP: With PCP and Dr. Rockey Situ within 1 to 2 weeks.   REASON FOR ADMISSION: Palpitations.   HOSPITAL COURSE: The patient is a 69 year old, Caucasian female with a history of atrial fibrillation, COPD, presented to the ED with palpitations. The patient was treated with 1 dose of Diltiazem. The patient was found to have atrial fibrillation with RVR, 130s to 140s, was given IV Cardizem, but her blood pressure dropped to 90/60s. For detailed history and physical examination, please refer to the admission note dictated by Dr. Volanda Napoleon. On admission date, the chest x-ray did not show any cardiopulmonary findings. Electrolytes were normal. Renal function is normal. BNP 1457. Troponin less than 0.02. For atrial fibrillation with RVR, the patient has been treated with Digoxin and sotalol. The patient still had atrial fibrillation with RVR with heart rate at 130s yesterday. I started Cardizem 120 mg p.o. daily. Dr. Rockey Situ discussed with the patient about cardioversion. The patient agreed to get a cardioversion today. He did a cardioversion this morning. The patient's atrial fibrillation was converted to normal sinus rhythm after cardioversion this morning. The patient has no complaints. According to Dr. Rockey Situ, continue sotalol, Digoxin and change Cardizem CD 120 mg to Diltiazem 30 mg t.i.d. as BP tolerated.   Chronic  obstructive pulmonary disease is stable. Continue the patient's home medication nebulizer.   The patient has no complaints. Vital signs are stable. She is clinically stable and will be discharged to home today. I discussed the patient's discharge plan with the patient, nurse, case manager and Dr. Rockey Situ.   TIME SPENT: About 43 minutes.    ____________________________ Demetrios Loll, MD qc:je D: 01/04/2015 11:57:00 ET T: 01/04/2015 17:28:33 ET JOB#: [id number]  cc: Demetrios Loll, MD, <Dictator> Demetrios Loll MD ELECTRONICALLY SIGNED 01/05/2015 9:12

## 2015-05-02 ENCOUNTER — Other Ambulatory Visit: Payer: Self-pay | Admitting: Nurse Practitioner

## 2015-05-04 ENCOUNTER — Telehealth: Payer: Self-pay

## 2015-05-04 ENCOUNTER — Encounter: Payer: Self-pay | Admitting: Emergency Medicine

## 2015-05-04 ENCOUNTER — Inpatient Hospital Stay
Admission: EM | Admit: 2015-05-04 | Discharge: 2015-05-08 | DRG: 378 | Disposition: A | Discharge status: Home / Self Care | Payer: Medicare Other | Attending: Internal Medicine | Admitting: Internal Medicine

## 2015-05-04 ENCOUNTER — Telehealth: Payer: Self-pay | Admitting: Nurse Practitioner

## 2015-05-04 DIAGNOSIS — K640 First degree hemorrhoids: Secondary | ICD-10-CM | POA: Diagnosis present

## 2015-05-04 DIAGNOSIS — E559 Vitamin D deficiency, unspecified: Secondary | ICD-10-CM | POA: Diagnosis not present

## 2015-05-04 DIAGNOSIS — Z9049 Acquired absence of other specified parts of digestive tract: Secondary | ICD-10-CM | POA: Diagnosis present

## 2015-05-04 DIAGNOSIS — R32 Unspecified urinary incontinence: Secondary | ICD-10-CM | POA: Diagnosis present

## 2015-05-04 DIAGNOSIS — M1990 Unspecified osteoarthritis, unspecified site: Secondary | ICD-10-CM | POA: Diagnosis present

## 2015-05-04 DIAGNOSIS — Z7901 Long term (current) use of anticoagulants: Secondary | ICD-10-CM

## 2015-05-04 DIAGNOSIS — Z90710 Acquired absence of both cervix and uterus: Secondary | ICD-10-CM

## 2015-05-04 DIAGNOSIS — E876 Hypokalemia: Secondary | ICD-10-CM | POA: Diagnosis present

## 2015-05-04 DIAGNOSIS — Z86010 Personal history of colonic polyps: Secondary | ICD-10-CM | POA: Diagnosis not present

## 2015-05-04 DIAGNOSIS — J449 Chronic obstructive pulmonary disease, unspecified: Secondary | ICD-10-CM | POA: Diagnosis not present

## 2015-05-04 DIAGNOSIS — F329 Major depressive disorder, single episode, unspecified: Secondary | ICD-10-CM | POA: Diagnosis not present

## 2015-05-04 DIAGNOSIS — F419 Anxiety disorder, unspecified: Secondary | ICD-10-CM | POA: Diagnosis present

## 2015-05-04 DIAGNOSIS — Z79899 Other long term (current) drug therapy: Secondary | ICD-10-CM

## 2015-05-04 DIAGNOSIS — D120 Benign neoplasm of cecum: Secondary | ICD-10-CM | POA: Diagnosis present

## 2015-05-04 DIAGNOSIS — I482 Chronic atrial fibrillation: Secondary | ICD-10-CM | POA: Diagnosis present

## 2015-05-04 DIAGNOSIS — Z886 Allergy status to analgesic agent status: Secondary | ICD-10-CM | POA: Diagnosis not present

## 2015-05-04 DIAGNOSIS — K922 Gastrointestinal hemorrhage, unspecified: Secondary | ICD-10-CM

## 2015-05-04 DIAGNOSIS — K5730 Diverticulosis of large intestine without perforation or abscess without bleeding: Secondary | ICD-10-CM | POA: Diagnosis present

## 2015-05-04 DIAGNOSIS — Z803 Family history of malignant neoplasm of breast: Secondary | ICD-10-CM | POA: Diagnosis not present

## 2015-05-04 DIAGNOSIS — K2270 Barrett's esophagus without dysplasia: Secondary | ICD-10-CM | POA: Diagnosis present

## 2015-05-04 DIAGNOSIS — Z87440 Personal history of urinary (tract) infections: Secondary | ICD-10-CM

## 2015-05-04 DIAGNOSIS — M069 Rheumatoid arthritis, unspecified: Secondary | ICD-10-CM | POA: Diagnosis present

## 2015-05-04 DIAGNOSIS — F418 Other specified anxiety disorders: Secondary | ICD-10-CM | POA: Diagnosis present

## 2015-05-04 DIAGNOSIS — Z91040 Latex allergy status: Secondary | ICD-10-CM

## 2015-05-04 DIAGNOSIS — K449 Diaphragmatic hernia without obstruction or gangrene: Secondary | ICD-10-CM | POA: Diagnosis not present

## 2015-05-04 DIAGNOSIS — Z800 Family history of malignant neoplasm of digestive organs: Secondary | ICD-10-CM

## 2015-05-04 DIAGNOSIS — R1310 Dysphagia, unspecified: Secondary | ICD-10-CM | POA: Diagnosis present

## 2015-05-04 DIAGNOSIS — Z7902 Long term (current) use of antithrombotics/antiplatelets: Secondary | ICD-10-CM | POA: Diagnosis not present

## 2015-05-04 DIAGNOSIS — Z87891 Personal history of nicotine dependence: Secondary | ICD-10-CM | POA: Diagnosis not present

## 2015-05-04 DIAGNOSIS — T39395A Adverse effect of other nonsteroidal anti-inflammatory drugs [NSAID], initial encounter: Secondary | ICD-10-CM | POA: Diagnosis present

## 2015-05-04 DIAGNOSIS — D62 Acute posthemorrhagic anemia: Secondary | ICD-10-CM | POA: Diagnosis present

## 2015-05-04 DIAGNOSIS — I4891 Unspecified atrial fibrillation: Secondary | ICD-10-CM | POA: Diagnosis present

## 2015-05-04 DIAGNOSIS — K219 Gastro-esophageal reflux disease without esophagitis: Secondary | ICD-10-CM | POA: Diagnosis present

## 2015-05-04 DIAGNOSIS — K2970 Gastritis, unspecified, without bleeding: Secondary | ICD-10-CM | POA: Diagnosis not present

## 2015-05-04 DIAGNOSIS — D125 Benign neoplasm of sigmoid colon: Secondary | ICD-10-CM | POA: Diagnosis present

## 2015-05-04 DIAGNOSIS — T45515A Adverse effect of anticoagulants, initial encounter: Secondary | ICD-10-CM | POA: Diagnosis not present

## 2015-05-04 DIAGNOSIS — K635 Polyp of colon: Secondary | ICD-10-CM | POA: Diagnosis not present

## 2015-05-04 DIAGNOSIS — D649 Anemia, unspecified: Secondary | ICD-10-CM | POA: Diagnosis not present

## 2015-05-04 DIAGNOSIS — G629 Polyneuropathy, unspecified: Secondary | ICD-10-CM | POA: Diagnosis not present

## 2015-05-04 DIAGNOSIS — Z833 Family history of diabetes mellitus: Secondary | ICD-10-CM | POA: Diagnosis not present

## 2015-05-04 DIAGNOSIS — K921 Melena: Secondary | ICD-10-CM | POA: Diagnosis not present

## 2015-05-04 DIAGNOSIS — K644 Residual hemorrhoidal skin tags: Secondary | ICD-10-CM | POA: Diagnosis not present

## 2015-05-04 DIAGNOSIS — K2960 Other gastritis without bleeding: Secondary | ICD-10-CM | POA: Diagnosis not present

## 2015-05-04 LAB — COMPREHENSIVE METABOLIC PANEL
ALBUMIN: 3.4 g/dL — AB (ref 3.5–5.0)
ALK PHOS: 100 U/L (ref 38–126)
ALT: 10 U/L — ABNORMAL LOW (ref 14–54)
AST: 25 U/L (ref 15–41)
Anion gap: 8 (ref 5–15)
BUN: 20 mg/dL (ref 6–20)
CO2: 24 mmol/L (ref 22–32)
CREATININE: 0.79 mg/dL (ref 0.44–1.00)
Calcium: 8.8 mg/dL — ABNORMAL LOW (ref 8.9–10.3)
Chloride: 107 mmol/L (ref 101–111)
GFR calc Af Amer: >60 mL/min (ref >60)
GLUCOSE: 105 mg/dL — AB (ref 65–99)
Potassium: 3.8 mmol/L (ref 3.5–5.1)
Sodium: 139 mmol/L (ref 135–145)
Total Bilirubin: 0.6 mg/dL (ref 0.3–1.2)
Total Protein: 6.8 g/dL (ref 6.5–8.1)

## 2015-05-04 LAB — CBC
HEMATOCRIT: 29.8 % — AB (ref 35.0–47.0)
Hemoglobin: 9 g/dL — ABNORMAL LOW (ref 12.0–16.0)
MCH: 22.2 pg — AB (ref 26.0–34.0)
MCHC: 30.3 g/dL — AB (ref 32.0–36.0)
MCV: 73.1 fL — AB (ref 80.0–100.0)
PLATELETS: 177 10*3/uL (ref 150–440)
RBC: 4.07 MIL/uL (ref 3.80–5.20)
RDW: 17.8 % — AB (ref 11.5–14.5)
WBC: 6.3 10*3/uL (ref 3.6–11.0)

## 2015-05-04 LAB — ABO/RH: ABO/RH(D): O POS

## 2015-05-04 LAB — GLUCOSE, CAPILLARY
GLUCOSE-CAPILLARY: 113 mg/dL — AB (ref 70–99)
Glucose-Capillary: 128 mg/dL — ABNORMAL HIGH (ref 70–99)

## 2015-05-04 LAB — PROTIME-INR
INR: 2.99
PROTHROMBIN TIME: 31.1 s — AB (ref 11.4–15.0)

## 2015-05-04 LAB — HEMOGLOBIN
HEMOGLOBIN: 8.4 g/dL — AB (ref 12.0–16.0)
Hemoglobin: 8.6 g/dL — ABNORMAL LOW (ref 12.0–16.0)

## 2015-05-04 LAB — APTT: APTT: 39 s — AB (ref 24–36)

## 2015-05-04 MED ORDER — LEFLUNOMIDE 20 MG PO TABS
20.0000 mg | ORAL_TABLET | Freq: Every day | ORAL | Status: DC
  Administered 2015-05-05 – 2015-05-08 (×3): 20 mg via ORAL
  Filled 2015-05-04 (×5): qty 1

## 2015-05-04 MED ORDER — HYDROXYCHLOROQUINE SULFATE 200 MG PO TABS
200.0000 mg | ORAL_TABLET | Freq: Two times a day (BID) | ORAL | Status: DC
  Administered 2015-05-04 – 2015-05-08 (×8): 200 mg via ORAL
  Filled 2015-05-04 (×9): qty 1

## 2015-05-04 MED ORDER — MAGNESIUM OXIDE 400 (241.3 MG) MG PO TABS
400.0000 mg | ORAL_TABLET | Freq: Every day | ORAL | Status: DC
  Administered 2015-05-05 – 2015-05-08 (×4): 400 mg via ORAL
  Filled 2015-05-04 (×4): qty 1

## 2015-05-04 MED ORDER — MAGNESIUM 30 MG PO TABS: 30.0000 mg | ORAL_TABLET | Freq: Every day | ORAL | Status: DC

## 2015-05-04 MED ORDER — BUDESONIDE-FORMOTEROL FUMARATE 160-4.5 MCG/ACT IN AERO
2.0000 | INHALATION_SPRAY | Freq: Two times a day (BID) | RESPIRATORY_TRACT | Status: DC
  Administered 2015-05-05 – 2015-05-08 (×6): 2 via RESPIRATORY_TRACT
  Filled 2015-05-04: qty 6

## 2015-05-04 MED ORDER — AMIODARONE HCL 200 MG PO TABS
400.0000 mg | ORAL_TABLET | Freq: Two times a day (BID) | ORAL | Status: DC
  Administered 2015-05-04 – 2015-05-05 (×2): 200 mg via ORAL
  Administered 2015-05-05: 11:00:00 400 mg via ORAL
  Administered 2015-05-06: 200 mg via ORAL
  Filled 2015-05-04 (×6): qty 2

## 2015-05-04 MED ORDER — ESCITALOPRAM OXALATE 10 MG PO TABS
20.0000 mg | ORAL_TABLET | Freq: Every day | ORAL | Status: DC
  Administered 2015-05-04 – 2015-05-08 (×5): 20 mg via ORAL
  Filled 2015-05-04 (×5): qty 2

## 2015-05-04 MED ORDER — ALBUTEROL SULFATE (2.5 MG/3ML) 0.083% IN NEBU
2.5000 mg | INHALATION_SOLUTION | RESPIRATORY_TRACT | Status: DC | PRN
  Administered 2015-05-06: 05:00:00 2.5 mg via RESPIRATORY_TRACT
  Filled 2015-05-04 (×2): qty 3

## 2015-05-04 MED ORDER — METOPROLOL SUCCINATE ER 25 MG PO TB24
25.0000 mg | ORAL_TABLET | Freq: Every day | ORAL | Status: DC
  Administered 2015-05-05: 25 mg via ORAL
  Filled 2015-05-04: qty 1

## 2015-05-04 MED ORDER — ACETAMINOPHEN 325 MG PO TABS
650.0000 mg | ORAL_TABLET | Freq: Four times a day (QID) | ORAL | Status: DC | PRN
  Administered 2015-05-05 – 2015-05-07 (×6): 650 mg via ORAL
  Filled 2015-05-04 (×7): qty 2

## 2015-05-04 MED ORDER — GABAPENTIN 300 MG PO CAPS
600.0000 mg | ORAL_CAPSULE | Freq: Every day | ORAL | Status: DC
  Administered 2015-05-04 – 2015-05-07 (×4): 600 mg via ORAL
  Filled 2015-05-04 (×5): qty 2

## 2015-05-04 MED ORDER — SODIUM CHLORIDE 0.9 % IV SOLN
80.0000 mg | Freq: Once | INTRAVENOUS | Status: AC
  Administered 2015-05-04: 16:00:00 80 mg via INTRAVENOUS
  Filled 2015-05-04: qty 80

## 2015-05-04 MED ORDER — SODIUM CHLORIDE 0.9 % IV SOLN
8.0000 mg/h | INTRAVENOUS | Status: DC
  Administered 2015-05-04 – 2015-05-07 (×7): 8 mg/h via INTRAVENOUS
  Filled 2015-05-04 (×6): qty 80

## 2015-05-04 MED ORDER — ACETAMINOPHEN 650 MG RE SUPP
650.0000 mg | Freq: Four times a day (QID) | RECTAL | Status: DC | PRN
  Filled 2015-05-04: qty 1

## 2015-05-04 MED ORDER — CLONAZEPAM 0.5 MG PO TABS
0.5000 mg | ORAL_TABLET | Freq: Three times a day (TID) | ORAL | Status: DC | PRN
  Administered 2015-05-04 – 2015-05-08 (×5): 0.5 mg via ORAL
  Filled 2015-05-04 (×5): qty 1

## 2015-05-04 MED ORDER — SODIUM CHLORIDE 0.9 % IV SOLN
INTRAVENOUS | Status: DC
  Administered 2015-05-04 – 2015-05-08 (×7): via INTRAVENOUS

## 2015-05-04 MED ORDER — INSULIN ASPART 100 UNIT/ML ~~LOC~~ SOLN
0.0000 [IU] | Freq: Three times a day (TID) | SUBCUTANEOUS | Status: DC
  Administered 2015-05-07: 10:00:00 1 [IU] via SUBCUTANEOUS
  Filled 2015-05-04: qty 1

## 2015-05-04 MED ORDER — TIOTROPIUM BROMIDE MONOHYDRATE 18 MCG IN CAPS
18.0000 ug | ORAL_CAPSULE | Freq: Every day | RESPIRATORY_TRACT | Status: DC
  Administered 2015-05-05 – 2015-05-08 (×3): 18 ug via RESPIRATORY_TRACT
  Filled 2015-05-04: qty 5

## 2015-05-04 MED ORDER — LEVALBUTEROL TARTRATE 45 MCG/ACT IN AERO: 2.0000 | INHALATION_SPRAY | RESPIRATORY_TRACT | Status: DC | PRN

## 2015-05-04 MED ORDER — PREDNISONE 10 MG PO TABS
10.0000 mg | ORAL_TABLET | Freq: Every day | ORAL | Status: DC | PRN
  Filled 2015-05-04: qty 1

## 2015-05-04 NOTE — ED Provider Notes (Signed)
[facility] Emergency Department Provider Note    ____________________________________________  Time seen: 11:30  I have reviewed the triage vital signs and the nursing notes.   HISTORY  Chief Complaint Rectal Bleeding    HPI Brandy Armstrong is a 69 y.o. female who presents with dark stools. She first noticed her stools were black and somewhat sticky 5 days ago. This has continued. She did notice a reddish tinge to her stool today. She called her primary doctor who said to come to the emergency department. She is on Xarelto for atrial fibrillation. She has never had this happen before. She denies abdominal pain she denies dizziness. No nausea or vomiting. Her father had colon cancer. No recent surgery    Past Medical History  Diagnosis Date  . Rheumatoid arthritis     On methotrexate and orencia  . GERD (gastroesophageal reflux disease)   . Atrial fibrillation     a. s/p ablation 01/2012 in [facility] by Dr. Boyd Kerbs;  b. On sotalol & Xarelto;  c. 02/2014 Echo: EF 50-55%, mild conc LVH, nl LA size/structure;  d. Recurrent afib 8/15 & 08/29/2014.  [name] Urinary incontinence   . Colon polyps   . Depression with anxiety   . Barretts esophagus   . Rectal fistula   . Vitamin D deficiency   . Chest pain     a. 02/2014 Myoview: Ef 50%, no ischemia.  [name] COPD (chronic obstructive pulmonary disease)     Patient Active Problem List   Diagnosis Date Noted  . Dysuria 03/07/2015  . Medicare annual wellness visit, subsequent 01/26/2015  . Hospital discharge follow-up 01/13/2015  . Atrial fibrillation 03/11/2014  . COPD (chronic obstructive pulmonary disease) 03/11/2014  . Neck pain on left side 03/02/2014  . Left ankle pain 09/17/2013  . Routine general medical examination at a health care facility 08/18/2013  . Fatigue 08/18/2013  . Barrett's esophagus 08/18/2013  . Chronic diarrhea 08/18/2013  . Numbness and tingling 08/18/2013  . Screening for breast cancer  08/18/2013  . Rectal fistula 08/18/2013    Past Surgical History  Procedure Laterality Date  . Cholecystectomy  1987  . Abdominal hysterectomy  1992  . Oophrectomy Bilateral 1992  . Cardiac electrophysiology study and ablation  2013    Current Outpatient Rx  Name  Route  Sig  Dispense  Refill  . amiodarone (PACERONE) 200 MG tablet   Oral   Take 400 mg by mouth 2 (two) times daily.      1   . budesonide-formoterol (SYMBICORT) 160-4.5 MCG/ACT inhaler   Inhalation   Inhale 2 puffs into the lungs 2 (two) times daily.         . cholestyramine (QUESTRAN) 4 G packet   Oral   Take 1 packet by mouth as needed.         . clonazePAM (KLONOPIN) 0.5 MG tablet   Oral   Take 1 tablet (0.5 mg total) by mouth 3 (three) times daily as needed.   90 tablet   1   . doxycycline (VIBRAMYCIN) 100 MG capsule   Oral   Take 1 capsule (100 mg total) by mouth 2 (two) times daily.   10 capsule   0   . escitalopram (LEXAPRO) 20 MG tablet   Oral   Take 1 tablet (20 mg total) by mouth daily.   30 tablet   2   . gabapentin (NEURONTIN) 300 MG capsule   Oral   Take 2 capsules (600 mg total)  by mouth at bedtime.   60 capsule   1   . gabapentin (NEURONTIN) 300 MG capsule      TAKE ONE CAPSULE BY MOUTH AT BEDTIME   30 capsule   6   . hydroxychloroquine (PLAQUENIL) 200 MG tablet   Oral   Take 200 mg by mouth 2 (two) times daily.         [name] leflunomide (ARAVA) 20 MG tablet   Oral   Take 20 mg by mouth daily.         [name] levalbuterol (XOPENEX HFA) 45 MCG/ACT inhaler   Inhalation   Inhale 2 puffs into the lungs every 4 (four) hours as needed for wheezing.         . magnesium 30 MG tablet   Oral   Take 30 mg by mouth daily.         . metoprolol succinate (TOPROL-XL) 25 MG 24 hr tablet            6   . omeprazole (PRILOSEC) 40 MG capsule   Oral   Take 1 capsule (40 mg total) by mouth daily.   30 capsule   5   . predniSONE (DELTASONE) 5 MG tablet   Oral   Take 5 mg  by mouth as needed.         . Rivaroxaban (XARELTO) 20 MG TABS tablet   Oral   Take 20 mg by mouth daily with supper.         . Tiotropium Bromide Monohydrate (SPIRIVA RESPIMAT) 2.5 MCG/ACT AERS   Inhalation   Inhale into the lungs daily.           Allergies Codeine and Latex  Family History  Problem Relation Age of Onset  . Depression Mother   . Cancer Mother 66    pancreatic cancer  . Cancer Father 81    colon cancer  . Cancer Sister 30    breast cancer  . Diabetes Brother     Social History History  Substance Use Topics  . Smoking status: Former Smoker -- 1.00 packs/day for 40 years    Quit date: 12/16/2011  . Smokeless tobacco: Never Used  . Alcohol Use: No     Comment: Occasionally has a drink    Review of Systems  Constitutional: Negative for fever. Eyes: Negative for visual changes. ENT: Negative for sore throat. Cardiovascular: Negative for chest pain. Respiratory: Negative for shortness of breath. Gastrointestinal: Negative for abdominal pain, vomiting and diarrhea. Positive for dark stools Genitourinary: Negative for dysuria. Musculoskeletal: Negative for back pain. Skin: Negative for rash. Neurological: Negative for headaches, focal weakness or numbness.   10-point ROS otherwise negative.  ____________________________________________   PHYSICAL EXAM:  VITAL SIGNS: ED Triage Vitals  Enc Vitals Group     BP 05/04/15 1114 111/56 mmHg     Pulse Rate 05/04/15 1114 79     Resp 05/04/15 1114 18     Temp 05/04/15 1114 97.6 F (36.4 C)     Temp Source 05/04/15 1114 Oral     SpO2 05/04/15 1114 99 %     Weight 05/04/15 1114 199 lb (90.266 kg)     Height 05/04/15 1114 5\' 6"  (1.676 m)     Head Cir --      Peak Flow --      Pain Score --      Pain Loc --      Pain Edu? --      Excl. in [place]? --  Constitutional: Alert and oriented. Well appearing and in no distress. Eyes: Conjunctivae are normal. PERRL. Normal extraocular  movements. ENT   Head: Normocephalic and atraumatic.   Nose: No congestion/rhinnorhea.   Mouth/Throat: Mucous membranes are moist.   Neck: No stridor. Hematological/Lymphatic/Immunilogical: No cervical lymphadenopathy. Cardiovascular: Normal rate, regular rhythm. Normal and symmetric distal pulses are present in all extremities. No murmurs, rubs, or gallops. Respiratory: Normal respiratory effort without tachypnea nor retractions. Breath sounds are clear and equal bilaterally. No wheezes/rales/rhonchi. Gastrointestinal: Soft and nontender. No distention.  There is no CVA tenderness. Black sticky stool guaiac positive. Genitourinary: Deferred Musculoskeletal: Nontender with normal range of motion in all extremities. No joint effusions.  No lower extremity tenderness nor edema. Neurologic:  Normal speech and language. No gross focal neurologic deficits are appreciated. Speech is normal. No gait instability. Skin:  Skin is warm, dry and intact. No rash noted. Psychiatric: Mood and affect are normal. Speech and behavior are normal. Patient exhibits appropriate insight and judgment.  ____________________________________________    LABS (pertinent positives/negatives)  Hemoglobin noted to be low  ____________________________________________   EKG  Date: 05/04/2015  Rate: 75  Rhythm: normal sinus rhythm  QRS Axis: normal  Intervals: normal  ST/T Wave abnormalities: normal  Conduction Disutrbances: none  Narrative Interpretation: unremarkable    ____________________________________________    RADIOLOGY  none  ____________________________________________   PROCEDURES  Procedure(s) performed: None  Critical Care performed: No  ____________________________________________   INITIAL IMPRESSION / ASSESSMENT AND PLAN / ED COURSE  Pertinent labs & imaging results that were available during my care of the patient were reviewed by me and considered in my medical  decision making (see chart for details).  Patient with black stool while on Xarelto obviously concerning for GI bleed. No nausea or vomiting or epigastric pain. Hemoglobin low but does not require transfusion at this time. We'll admit for further evaluation.  ____________________________________________   FINAL CLINICAL IMPRESSION(S) / ED DIAGNOSES  Final diagnoses:  Acute gastrointestinal bleeding     Lavonia Drafts, MD 05/04/15 1309

## 2015-05-04 NOTE — Telephone Encounter (Signed)
Patient Name: Brandy Armstrong DOB: October 26, 1946 Initial Comment Caller states she has been having dark stools with blood for a couple of days. Nurse Assessment Nurse: Marcelline Deist, RN, Lynda Date/Time (Eastern Time): 05/04/2015 8:36:23 AM Confirm and document reason for call. If symptomatic, describe symptoms. ---Caller states she has been having dark/black stools with blood for a couple of days. When she wipes, there is darker blood on the paper. Has had gas, is bloated. She has a fistula, usually when it bleeds, it is bright red. Her lower back is bothering her. Was put on Doxycycline for UTI & sores under breast. Just took last one yesterday. No fever. Has the patient traveled out of the country within the last 30 days? ---Not Applicable Does the patient require triage? ---Yes Related visit to physician within the last 2 weeks? ---No Does the PT have any chronic conditions? (i.e. diabetes, asthma, etc.) ---Yes List chronic conditions. ---RA, COPD, Afib, on a blood thinner - Xarelto Guidelines Guideline Title Affirmed Question Affirmed Notes Rectal Bleeding Tarry or jet black-colored stool (not dark green) Final Disposition User Go to ED Now Marcelline Deist, RN, Lynda Comments Caller is wanting to be seen at the office if possible. Nurse will contact backline to make them aware of the ER outcome & pt's desire to be seen in office. She needs to leave around 9:15 am this morning. Spoke with Chrys Racer at office who will make provider aware. Caller states she has a number of large bruises on her body, some from bumping into objects, others that just appeared.

## 2015-05-04 NOTE — ED Notes (Signed)
Rectal bleeding, black ,  x7 days , abd pain x1 day

## 2015-05-04 NOTE — Telephone Encounter (Signed)
Pt in the hospital

## 2015-05-04 NOTE — Consult Note (Signed)
Consultation  Referring Provider: Dr. Earleen Newport  Primary Care Physician:  Rubbie Battiest, NP Consulting  Gastroenterologist:Dr. Herbie Baltimore Elliott/Sigourney Portillo Jerelene Redden ANP         Reason for Consultation: Melena             HPI:   Brandy Armstrong is a 69 y.o. female with history of Barrett's esophagus, colon polyps, a fib on Xarelto with recent cardiac ablation, cardioversion and amiodarone, COPD, RA with recent flair treated with short course of prednisone, chronic Advil use for back pain, presents to the ER with melena.   She reports seeing a little black stool this weekend. Yesterday afternoon, she passed black stool. She blamed it on standing on her feet for 3 hours a day. Yesterday evening she had another black stool mixed with brown stool. It smelled terrible. She has a known rectal fistula, but that has not been bleeding. She denies red or maroon stool. Some stomach cramps and bloating  but no pain. Cramps resolve after bowel movement. Stools x 2 yesterday balls, formed to loose. She denies weakness, CP or SOB. Positive bruising.   She has rare heartburn and has been taking her Nexium only occasionally for some time. She denies any upper complaints and is hungry now. No n/v. Positive dieting and weight loss 12 lbs since March- which helped her HB. Positive food/pills  hangs slightly in her throat on rare occasions.   She had a colonoscopy and EGD  By Dr. Allen Norris 08/2013. Records not available. She has had colonoscopy and EGD done through the [facility] department 11/23/2012 notable for multiple colon polyps, perianal fistula without tracks, diverticulosis, IH. The EGD showed Barrett's long segment , med HH, gastritis.    Past Medical History  Diagnosis Date  . Rheumatoid arthritis     On methotrexate and orencia  . GERD (gastroesophageal reflux disease)   . Atrial fibrillation     a. s/p ablation 01/2012 in [facility] by Dr. Boyd Kerbs;  b. On sotalol & Xarelto;  c. 02/2014 Echo: EF 50-55%, mild conc LVH, nl  LA size/structure;  d. Recurrent afib 8/15 & 08/29/2014.  [name] Urinary incontinence   . Colon polyps   . Depression with anxiety   . Barretts esophagus   . Rectal fistula   . Vitamin D deficiency   . Chest pain     a. 02/2014 Myoview: Ef 50%, no ischemia.  [name] COPD (chronic obstructive pulmonary disease)   . COPD (chronic obstructive pulmonary disease)     Past Surgical History  Procedure Laterality Date  . Cholecystectomy  1987  . Abdominal hysterectomy  1992  . Oophrectomy Bilateral 1992  . Cardiac electrophysiology study and ablation  2013    Family History  Problem Relation Age of Onset  . Depression Mother   . Cancer Mother 24    pancreatic cancer  . Cancer Father 71    colon cancer  . Cancer Sister 30    breast cancer  . Diabetes Brother      History  Substance Use Topics  . Smoking status: Former Smoker -- 1.00 packs/day for 40 years    Quit date: 12/16/2011  . Smokeless tobacco: Never Used  . Alcohol Use: No     Comment: Occasionally has a drink    Prior to Admission medications   Medication Sig Start Date End Date Taking? Authorizing Provider  amiodarone (PACERONE) 200 MG tablet Take 400 mg by mouth 2 (two) times daily. 03/28/15  Yes Historical Provider, MD  budesonide-formoterol (SYMBICORT) 160-4.5 MCG/ACT inhaler Inhale 2 puffs into the lungs 2 (two) times daily.   Yes Historical Provider, MD  cholestyramine Lucrezia Starch) 4 G packet Take 1 packet by mouth as needed.   Yes Historical Provider, MD  clonazePAM (KLONOPIN) 0.5 MG tablet Take 1 tablet (0.5 mg total) by mouth 3 (three) times daily as needed. 04/28/15  Yes Rubbie Battiest, NP  escitalopram (LEXAPRO) 20 MG tablet Take 1 tablet (20 mg total) by mouth daily. 03/13/15  Yes Rubbie Battiest, NP  gabapentin (NEURONTIN) 300 MG capsule Take 2 capsules (600 mg total) by mouth at bedtime. 04/28/15  Yes Rubbie Battiest, NP  hydroxychloroquine (PLAQUENIL) 200 MG tablet Take 200 mg by mouth 2 (two) times daily. 10/17/14  Yes  Historical Provider, MD  leflunomide (ARAVA) 20 MG tablet Take 20 mg by mouth daily. 01/09/15  Yes Historical Provider, MD  levalbuterol Penne Lash HFA) 45 MCG/ACT inhaler Inhale 2 puffs into the lungs every 4 (four) hours as needed for wheezing.   Yes Historical Provider, MD  magnesium 30 MG tablet Take 30 mg by mouth daily.   Yes Historical Provider, MD  metoprolol succinate (TOPROL-XL) 25 MG 24 hr tablet  04/06/15  Yes Historical Provider, MD  predniSONE (DELTASONE) 5 MG tablet Take 5 mg by mouth as needed.   Yes Historical Provider, MD  Rivaroxaban (XARELTO) 20 MG TABS tablet Take 20 mg by mouth daily with supper.   Yes Historical Provider, MD  tiotropium (SPIRIVA) 18 MCG inhalation capsule Place 18 mcg into inhaler and inhale daily.   Yes Historical Provider, MD    Current Facility-Administered Medications  Medication Dose Route Frequency Provider Last Rate Last Dose  . 0.9 %  sodium chloride infusion   Intravenous Continuous Loletha Grayer, MD 50 mL/hr at 05/04/15 1555    . acetaminophen (TYLENOL) tablet 650 mg  650 mg Oral Q6H PRN Loletha Grayer, MD       Or  . acetaminophen (TYLENOL) suppository 650 mg  650 mg Rectal Q6H PRN Loletha Grayer, MD      . albuterol (PROVENTIL) (2.5 MG/3ML) 0.083% nebulizer solution 2.5 mg  2.5 mg Nebulization Q4H PRN Loletha Grayer, MD      . amiodarone (PACERONE) tablet 400 mg  400 mg Oral BID Loletha Grayer, MD   400 mg at 05/04/15 1533  . budesonide-formoterol (SYMBICORT) 160-4.5 MCG/ACT inhaler 2 puff  2 puff Inhalation BID Loletha Grayer, MD      . clonazePAM [facility]) tablet 0.5 mg  0.5 mg Oral TID PRN Loletha Grayer, MD      . escitalopram (LEXAPRO) tablet 20 mg  20 mg Oral Daily Loletha Grayer, MD   20 mg at 05/04/15 1601  . gabapentin (NEURONTIN) capsule 600 mg  600 mg Oral QHS Loletha Grayer, MD      . hydroxychloroquine (PLAQUENIL) tablet 200 mg  200 mg Oral BID Loletha Grayer, MD   200 mg at 05/04/15 1530  . insulin aspart (novoLOG)  injection 0-9 Units  0-9 Units Subcutaneous TID WC Loletha Grayer, MD      . leflunomide (ARAVA) tablet 20 mg  20 mg Oral Daily Loletha Grayer, MD   20 mg at 05/04/15 1534  . [START ON 05/05/2015] magnesium oxide (MAG-OX) tablet 400 mg  400 mg Oral Daily Loletha Grayer, MD      . metoprolol succinate (TOPROL-XL) 24 hr tablet 25 mg  25 mg Oral Daily Loletha Grayer, MD   25 mg at 05/04/15 1537  . pantoprazole (PROTONIX)  80 mg in sodium chloride 0.9 % 250 mL (0.32 mg/mL) infusion  8 mg/hr Intravenous Continuous Loletha Grayer, MD      . predniSONE (DELTASONE) tablet 10 mg  10 mg Oral Daily PRN Loletha Grayer, MD      . tiotropium [facility]) inhalation capsule 18 mcg  18 mcg Inhalation Daily Loletha Grayer, MD   18 mcg at 05/04/15 1539    Allergies as of 05/04/2015 - Review Complete 05/04/2015  Allergen Reaction Noted  . Codeine Nausea Only 08/18/2013  . Latex  08/18/2013     Review of Systems:    A 12 system review was obtained and all negative except where noted in HPI. Positive right wrist RA flair with pain/swelling and 2 days of Prednisone. Positive feet neuropathy. Positive anxiety/depression-stable and doing well.     Physical Exam:  Vital signs in last 24 hours: Temp:  [97.6 F (36.4 C)-97.7 F (36.5 C)] 97.7 F (36.5 C) (05/05 1449) Pulse Rate:  [76-79] 78 (05/05 1449) Resp:  [18-20] 18 (05/05 1449) BP: (103-111)/(56-65) 106/65 mmHg (05/05 1449) SpO2:  [96 %-99 %] 96 % (05/05 1449) Weight:  [90.266 kg (199 lb)] 90.266 kg (199 lb) (05/05 1114)    General:  Well-developed, well-nourished and in no acute distress Head:  Head without obvious abnormality, atraumatic  Eyes:   Conjunctiva pink, sclera anicteric   ENT:   Mouth free of lesions, mucosa moist, tongue pink, no thrush noted, teeth and gums normal Neck:   Supple w/o thyromegaly or mass, trachea midline, no adenopathy  Lungs: Clear to auscultation bilaterally, respirations unlabored Heart:     Normal S1S2, no rubs,  murmurs, gallops. Abdomen: Soft, non-tender, no hepatosplenomegaly, hernia, or mass and BS normal Rectal: Deferred- done  in ER reported black, hem pos stool  Lymph:  No cervical or supraclavicular adenopathy. Extremities:   No edema, cyanosis, or clubbing Skin  Skin color normal, extensive scattered small bruising, no  rashes or lesions Neuro:  A&O x 3. CNII-XII intact, normal strength Psych:  Appropriate mood and affect.  Data Reviewed:  LAB RESULTS:  Recent Labs  05/04/15 1135 05/04/15 1404  WBC 6.3  --   HGB 9.0* 8.6*  HCT 29.8*  --   PLT 177  --    BMET  Recent Labs  05/04/15 1135  NA 139  K 3.8  CL 107  CO2 24  GLUCOSE 105*  BUN 20  CREATININE 0.79  CALCIUM 8.8*   LFT  Recent Labs  05/04/15 1135  PROT 6.8  ALBUMIN 3.4*  AST 25  ALT 10*  ALKPHOS 100  BILITOT 0.6   PT/INR  Recent Labs  05/04/15 1135  LABPROT 31.1*  INR 2.99    STUDIES: No results found.   Assessment:  FAWNA CRANMER is a 68 y.o. presents with melena and acute on chronic anemia. Etiology is likely UGI bleed. She has multiple risk factors  For gastric or duodenal ulcer to include Xarelto, NSAID, hx of Barrett's, gastritis, med HH, stress. She has mild throat dysphagia that will need evaluated for possible dilatation if no esophagitis is present.   Plan:  Continue with Protonix bolus and drip. Sips of water. Hold Xarelto. Dr. Vira Agar to consider timing of EGD- will need done as inpatient. INR 2.99. Will repeat in am. If EGD is not done tomorrow, she can have clear liquids but no carbonation or anything red.  Serial Hgb q 8 and careful monitoring. Xarelto is usually held for 2 days if Cr is  normal.    This case was discussed with Dr. Manya Silvas in collaboration of care. Thank you for the consultation.  These services provided by Denice Paradise RN, MSN, ANP-BC under collaborative practice agreement with Manya Silvas, MD.  05/04/2015, 4:52 PM

## 2015-05-04 NOTE — H&P (Signed)
[place] [facility] [place] NAME: Brandy Armstrong    MR#:  [id number]  DATE OF BIRTH:  02/08/1946  DATE OF ADMISSION:  05/04/2015  PRIMARY CARE PHYSICIAN: Rubbie Battiest, NP   REQUESTING/REFERRING PHYSICIAN: Dr. Ermalinda Barrios  CHIEF COMPLAINT:   Black stools, dark red blood last night.  HISTORY OF PRESENT ILLNESS:  Brandy Armstrong  is a 69 y.o. female with a known history of atrial fibrillation, COPD, rheumatoid arthritis, neuropathy, Barretts esophagus and tremor. She states that she's had blood in the stool has been black last few days last night she had a dark red blood and she had 1 bowel movement this morning that was black. 2 bowel movements yesterday. No nausea or vomiting. Her abdomen is bloated. She states that she also bruises very easily. Of note she does take 3 Advil pills per day secondary to joint pains today she took prednisone secondary to her wrist being inflamed. Last week she was also on doxycycline for UTI.   In the ER, she is hemodynamically stable and her hemoglobin was found to be 9.0. Looking back at old labs, she does have a history of anemia and a recent hemoglobin was in the 10 range.  PAST MEDICAL HISTORY:   Past Medical History  Diagnosis Date  . Rheumatoid arthritis     On methotrexate and orencia  . GERD (gastroesophageal reflux disease)   . Atrial fibrillation     a. s/p ablation 01/2012 in [facility] by Dr. Boyd Kerbs;  b. On sotalol & Xarelto;  c. 02/2014 Echo: EF 50-55%, mild conc LVH, nl LA size/structure;  d. Recurrent afib 8/15 & 08/29/2014.  [name] Urinary incontinence   . Colon polyps   . Depression with anxiety   . Barretts esophagus   . Rectal fistula   . Vitamin D deficiency   . Chest pain     a. 02/2014 Myoview: Ef 50%, no ischemia.  [name] COPD (chronic obstructive pulmonary disease)     PAST SURGICAL HISTORY:   Past Surgical History  Procedure Laterality Date  . Cholecystectomy  1987  . Abdominal hysterectomy  1992   . Oophrectomy Bilateral 1992  . Cardiac electrophysiology study and ablation  2013    SOCIAL HISTORY:   History  Substance Use Topics  . Smoking status: Former Smoker -- 1.00 packs/day for 40 years    Quit date: 12/16/2011  . Smokeless tobacco: Never Used  . Alcohol Use: No     Comment: Occasionally has a drink    FAMILY HISTORY:   Family History  Problem Relation Age of Onset  . Depression Mother   . Cancer Mother 16    pancreatic cancer  . Cancer Father 41    colon cancer  . Cancer Sister 30    breast cancer  . Diabetes Brother     DRUG ALLERGIES:   Allergies  Allergen Reactions  . Codeine Nausea Only  . Latex     REVIEW OF SYSTEMS:  CONSTITUTIONAL: No fever, fatigue or weakness. Occasional sweats, some weight loss.  EYES: No blurred or double vision. Wears glasses. EARS, NOSE, AND THROAT: No tinnitus or ear pain. No sore throat. Occasional pill dysphasia RESPIRATORY: No cough, positive for shortness of breath, No wheezing or hemoptysis.  CARDIOVASCULAR: No chest pain, orthopnea, edema.  GASTROINTESTINAL: No nausea, vomiting, diarrhea. Positive for abdominal bloating. Positive for melena and dark red blood in stool GENITOURINARY: No dysuria, hematuria. Recent urinary tract infection ENDOCRINE: No polyuria, nocturia,  HEMATOLOGY: Positive for anemia and easy bruising. SKIN: Bilateral hand palmar erythema which is chronic for the patient MUSCULOSKELETAL: Positive for joint pains all over especially right wrist. NEUROLOGIC: No tingling, numbness, weakness.  PSYCHIATRY: Positive for anxiety or depression.   MEDICATIONS AT HOME:   Prior to Admission medications   Medication Sig Start Date End Date Taking? Authorizing Provider  amiodarone (PACERONE) 200 MG tablet Take 400 mg by mouth 2 (two) times daily. 03/28/15  Yes Historical Provider, MD  budesonide-formoterol (SYMBICORT) 160-4.5 MCG/ACT inhaler Inhale 2 puffs into the lungs 2 (two) times daily.   Yes  Historical Provider, MD  cholestyramine Lucrezia Starch) 4 G packet Take 1 packet by mouth as needed.   Yes Historical Provider, MD  clonazePAM (KLONOPIN) 0.5 MG tablet Take 1 tablet (0.5 mg total) by mouth 3 (three) times daily as needed. 04/28/15  Yes Rubbie Battiest, NP  escitalopram (LEXAPRO) 20 MG tablet Take 1 tablet (20 mg total) by mouth daily. 03/13/15  Yes Rubbie Battiest, NP  gabapentin (NEURONTIN) 300 MG capsule Take 2 capsules (600 mg total) by mouth at bedtime. 04/28/15  Yes Rubbie Battiest, NP  hydroxychloroquine (PLAQUENIL) 200 MG tablet Take 200 mg by mouth 2 (two) times daily. 10/17/14  Yes Historical Provider, MD  leflunomide (ARAVA) 20 MG tablet Take 20 mg by mouth daily. 01/09/15  Yes Historical Provider, MD  levalbuterol Penne Lash HFA) 45 MCG/ACT inhaler Inhale 2 puffs into the lungs every 4 (four) hours as needed for wheezing.   Yes Historical Provider, MD  magnesium 30 MG tablet Take 30 mg by mouth daily.   Yes Historical Provider, MD  metoprolol succinate (TOPROL-XL) 25 MG 24 hr tablet  04/06/15  Yes Historical Provider, MD  predniSONE (DELTASONE) 5 MG tablet Take 5 mg by mouth as needed.   Yes Historical Provider, MD  Rivaroxaban (XARELTO) 20 MG TABS tablet Take 20 mg by mouth daily with supper.   Yes Historical Provider, MD  tiotropium (SPIRIVA) 18 MCG inhalation capsule Place 18 mcg into inhaler and inhale daily.   Yes Historical Provider, MD      VITAL SIGNS:  Blood pressure 103/60, pulse 76, temperature 97.6 F (36.4 C), temperature source Oral, resp. rate 20, height 5\' 6"  (1.676 m), weight 90.266 kg (199 lb), SpO2 98 %.  PHYSICAL EXAMINATION:  GENERAL:  69 y.o.-year-old patient lying in the bed with no acute distress.  EYES: Pupils equal, round, reactive to light and accommodation. No scleral icterus. Extraocular muscles intact.  HEENT: Head atraumatic, normocephalic. Oropharynx and nasopharynx clear.  NECK:  Supple, no jugular venous distention. No thyroid enlargement, no  tenderness.  LUNGS: Normal breath sounds bilaterally, no wheezing, rales,rhonchi or crepitation. No use of accessory muscles of respiration.  CARDIOVASCULAR: S1, S2 normal. No murmurs, rubs, or gallops.  ABDOMEN: Soft, nontender, nondistended. Bowel sounds present. No organomegaly or mass. ER physician did rectal exam which was positive for black stool and Hemoccult positive. EXTREMITIES: 1+ edema, cyanosis, or clubbing.  NEUROLOGIC: Cranial nerves II through XII are intact. Muscle strength 5/5 in all extremities. Sensation intact. PSYCHIATRIC: The patient is alert and oriented x 3.  SKIN: No rash, lesion, or ulcer.   LABORATORY PANEL:   CBC  Recent Labs Lab 05/04/15 1135  WBC 6.3  HGB 9.0*  HCT 29.8*  PLT 177   ------------------------------------------------------------------------------------------------------------------  Chemistries   Recent Labs Lab 05/04/15 1135  NA 139  K 3.8  CL 107  CO2 24  GLUCOSE 105*  BUN 20  CREATININE  0.79  CALCIUM 8.8*  AST 25  ALT 10*  ALKPHOS 100  BILITOT 0.6   -----------------------------------------------------------------------------------------------------------  RADIOLOGY:  No results found.  EKG:   Orders placed or performed in visit on 01/25/15  . EKG 12-Lead   sinus rhythm 75 bpm.  IMPRESSION AND PLAN:   #1 acute gastrointestinal bleed secondary to NSAIDs. Acute blood loss anemia. -Stop Xarelto and Advil. -Serial hemoglobins. Patient consented for blood if needed. -GI consult. Case discussed with Dr. Vira Agar gastroenterology to see the patient. -Protonix drip. #2 atrial fibrillation which is now in normal sinus rhythm. -Risk of stroke is higher off Xarelto. -Rate controlled on current medications and maintained in sinus rhythm on amiodarone. #3 rheumatoid arthritis with joint flare right wrist. -We'll continue usual medications for rheumatoid arthritis. -Prednisone 10 mg daily. #4 COPD -Continue Symbicort  and Spiriva. #5 neuropathy -On gabapentin.  #6 anxiety and depression -Continue psychiatric medications.    All the records are reviewed and case discussed with ED provider. Management plans discussed with the patient, family and they are in agreement.  CODE STATUS: Full code  TOTAL TIME TAKING CARE OF THIS PATIENT: 60 minutes.    Loletha Grayer M.D on 05/04/2015 at 2:15 PM  Between 7am to 6pm - Pager - [phone]  After 6pm call admission pager [facility]  Office  [phone]  CC: Primary care physician; Rubbie Battiest, NP

## 2015-05-04 NOTE — Telephone Encounter (Signed)
[facility] called and stated the patient called and is stating she is having bloody stools.  The RN directed her to go to the emergency room to seek immediate treatment.  However, the patient was hesitant and did not say whether she would go or not.

## 2015-05-04 NOTE — Telephone Encounter (Signed)
Make sure she is seen by the ER.

## 2015-05-04 NOTE — Telephone Encounter (Signed)
Please advise 

## 2015-05-04 NOTE — ED Notes (Signed)
Pt arrives with complaints of rectal bleeding, pt states dark tarry stools for 1 week but states last night she saw some pinkish color in her stools, pt on xarelto for hx of afib, pt states bruising easily, pt denies any bloody vomit Brandy Armstrong, Brandy Bucy, RN

## 2015-05-05 ENCOUNTER — Other Ambulatory Visit: Payer: Self-pay | Admitting: Unknown Physician Specialty

## 2015-05-05 DIAGNOSIS — K921 Melena: Secondary | ICD-10-CM

## 2015-05-05 LAB — GLUCOSE, CAPILLARY
Glucose-Capillary: 92 mg/dL (ref 70–99)
Glucose-Capillary: 90 mg/dL (ref 70–99)
Glucose-Capillary: 77 mg/dL (ref 70–99)
Glucose-Capillary: 80 mg/dL (ref 70–99)

## 2015-05-05 LAB — CBC
HEMATOCRIT: 26 % — AB (ref 35.0–47.0)
HEMOGLOBIN: 7.8 g/dL — AB (ref 12.0–16.0)
MCH: 21.9 pg — ABNORMAL LOW (ref 26.0–34.0)
MCHC: 30.1 g/dL — AB (ref 32.0–36.0)
MCV: 72.8 fL — AB (ref 80.0–100.0)
Platelets: 151 10*3/uL (ref 150–440)
RBC: 3.57 MIL/uL — ABNORMAL LOW (ref 3.80–5.20)
RDW: 17.6 % — ABNORMAL HIGH (ref 11.5–14.5)
WBC: 6.7 10*3/uL (ref 3.6–11.0)

## 2015-05-05 LAB — BASIC METABOLIC PANEL
Anion gap: 4 — ABNORMAL LOW (ref 5–15)
BUN: 11 mg/dL (ref 6–20)
CALCIUM: 8.1 mg/dL — AB (ref 8.9–10.3)
CO2: 25 mmol/L (ref 22–32)
Chloride: 111 mmol/L (ref 101–111)
Creatinine, Ser: 0.66 mg/dL (ref 0.44–1.00)
GFR calc Af Amer: >60 mL/min (ref >60)
GLUCOSE: 88 mg/dL (ref 65–99)
Potassium: 3.6 mmol/L (ref 3.5–5.1)
Sodium: 140 mmol/L (ref 135–145)

## 2015-05-05 LAB — HEMOGLOBIN: HEMOGLOBIN: 9.9 g/dL — AB (ref 12.0–16.0)

## 2015-05-05 LAB — HEMOGLOBIN AND HEMATOCRIT, BLOOD
HEMATOCRIT: 26.1 % — AB (ref 35.0–47.0)
HEMOGLOBIN: 8 g/dL — AB (ref 12.0–16.0)

## 2015-05-05 LAB — PREPARE RBC (CROSSMATCH)

## 2015-05-05 MED ORDER — METOPROLOL SUCCINATE ER 25 MG PO TB24
25.0000 mg | ORAL_TABLET | Freq: Two times a day (BID) | ORAL | Status: DC
  Administered 2015-05-06 – 2015-05-08 (×5): 25 mg via ORAL
  Filled 2015-05-05 (×5): qty 1

## 2015-05-05 MED ORDER — SODIUM CHLORIDE 0.9 % IV SOLN
INTRAVENOUS | Status: DC
  Administered 2015-05-06: 10:00:00 via INTRAVENOUS

## 2015-05-05 MED ORDER — SODIUM CHLORIDE 0.9 % IV SOLN
Freq: Once | INTRAVENOUS | Status: AC
Start: 2015-05-05 — End: 2015-05-05
  Administered 2015-05-05: 16:00:00 via INTRAVENOUS

## 2015-05-05 MED ORDER — METOPROLOL TARTRATE 25 MG PO TABS
ORAL_TABLET | ORAL | Status: AC
  Administered 2015-05-05: 23:00:00
  Filled 2015-05-05: qty 1

## 2015-05-05 NOTE — Care Management (Signed)
Admitted to [facility] with the diagnosis of GI Bleed. Lives alone. POA is Jarome Matin [phone]). Sees Nurse Practioner Ms. Doss. Last seen 2 weeks ago. No home health. No skilled facility. No home oxygen. Still drives. Uses no aides for ambulation. Takes care of all needs herself. Friend will transport.  Shelbie Ammons RN MSN Care Management [phone]

## 2015-05-05 NOTE — Progress Notes (Signed)
[place] at [place] NAME: Brandy Armstrong    MR#:  [id number]  DATE OF BIRTH:  [birth year]  SUBJECTIVE:  CHIEF COMPLAINT:   Chief Complaint  Patient presents with  . Rectal Bleeding   Still has black stool in the commode, no liquidy, but soft and black in color, HGb is down to 7.8, not feeling lightheaded or dizzy at the moment  REVIEW OF SYSTEMS:   CONSTITUTIONAL: No fever, fatigue or weakness.  EYES: No blurred or double vision.  EARS, NOSE, AND THROAT: No tinnitus or ear pain.  RESPIRATORY: No cough, shortness of breath, wheezing or hemoptysis.  CARDIOVASCULAR: No chest pain, orthopnea, edema.  GASTROINTESTINAL: No nausea, vomiting, diarrhea or abdominal pain.  GENITOURINARY: No dysuria, hematuria.  ENDOCRINE: No polyuria, nocturia,  HEMATOLOGY: No anemia, easy bruising , admits of rectal bleeding, black stool past 5 days, worsened past 3 days SKIN: No rash or lesion. MUSCULOSKELETAL: No joint pain or arthritis.   NEUROLOGIC: No tingling, numbness, weakness.  PSYCHIATRY: No anxiety or depression.   VITAL SIGNS: Blood pressure 119/61, pulse 77, temperature 98.5 F (36.9 C), temperature source Oral, resp. rate 18, height 5\' 6"  (1.676 m), weight 90.266 kg (199 lb), SpO2 96 %.  PHYSICAL EXAMINATION:   GENERAL:  69 y.o.-year-old patient lying in the bed with no acute distress. Sitting on the commode, wiping , stool is noted green to black, soft, floating in the commode.  EYES: Pupils equal, round, reactive to light and accommodation. No scleral icterus. Extraocular muscles intact.  HEENT: Head atraumatic, normocephalic. Oropharynx and nasopharynx clear.  NECK:  Supple, no jugular venous distention. No thyroid enlargement, no tenderness.  LUNGS: Normal breath sounds bilaterally, no wheezing, rales,rhonchi or crepitation. No use of accessory muscles of respiration.  CARDIOVASCULAR: S1, S2 normal. No murmurs, rubs, or gallops.   ABDOMEN: Soft, nontender, nondistended. Bowel sounds present. No organomegaly or mass.  EXTREMITIES: No pedal edema, cyanosis, or clubbing.  NEUROLOGIC: Cranial nerves II through XII are intact. Muscle strength 5/5 in all extremities. Sensation intact. Gait not checked.  PSYCHIATRIC: The patient is alert and oriented x 3.  SKIN: No obvious rash, lesion, or ulcer.   ORDERS/RESULTS REVIEWED:   CBC  Recent Labs Lab 05/04/15 1135 05/04/15 1404 05/04/15 2159 05/05/15 0600  WBC 6.3  --   --  6.7  HGB 9.0* 8.6* 8.4* 7.8*  HCT 29.8*  --   --  26.0*  PLT 177  --   --  151  MCV 73.1*  --   --  72.8*  MCH 22.2*  --   --  21.9*  MCHC 30.3*  --   --  30.1*  RDW 17.8*  --   --  17.6*   ------------------------------------------------------------------------------------------------------------------  Chemistries   Recent Labs Lab 05/04/15 1135 05/05/15 0600  NA 139 140  K 3.8 3.6  CL 107 111  CO2 24 25  GLUCOSE 105* 88  BUN 20 11  CREATININE 0.79 0.66  CALCIUM 8.8* 8.1*  AST 25  --   ALT 10*  --   ALKPHOS 100  --   BILITOT 0.6  --    ------------------------------------------------------------------------------------------------------------------ estimated creatinine clearance is 78.3 mL/min (by C-G formula based on Cr of 0.66). ------------------------------------------------------------------------------------------------------------------ No results for input(s): TSH, T4TOTAL, T3FREE, THYROIDAB in the last 72 hours.  Invalid input(s): FREET3  Cardiac Enzymes No results for input(s): CKMB, TROPONINI, MYOGLOBIN in the last 168 hours.  Invalid input(s): CK ------------------------------------------------------------------------------------------------------------------ Invalid input(s): POCBNP ---------------------------------------------------------------------------------------------------------------  RADIOLOGY: No results found.  EKG:  Orders placed or performed  during the hospital encounter of 05/04/15  . EKG 12-Lead  . EKG 12-Lead    ASSESSMENT AND PLAN: #1 acute gastrointestinal bleed secondary to NSAIDs. Now off  Xarelto and Advil. NPO, PPI IV, d/w DR Vira Agar, EGD likely tomorrow.   #2 Acute blood loss anemia. Serial hemoglobins. Patient consented for blood , transfuse 1 unit, recheck HG after transfusion.  -Protonix drip.  #3 atrial fibrillation ,  now in normal sinus rhythm. -Risk of stroke is higher off Xarelto. -Rate controlled on current medications and maintained in sinus rhythm on amiodarone.  #4 rheumatoid arthritis with joint flare right wrist. -We'll continue usual medications for rheumatoid arthritis. -Prednisone 10 mg daily.  #5 COPD -Continue Symbicort and Spiriva. Stable  #6 neuropathy -On gabapentin.  #7 anxiety and depression -Continue psychiatric medications  DRUG ALLERGIES:  Allergies  Allergen Reactions  . Codeine Nausea Only  . Latex     CODE STATUS:     Code Status Orders        Start     Ordered   05/04/15 1354  Full code   Continuous     05/04/15 1355    Advance Directive Documentation        Most Recent Value   Type of Advance Directive  Healthcare Power of Attorney   Pre-existing out of facility DNR order (yellow form or pink MOST form)     "MOST" Form in Place?        TOTAL TIME TAKING CARE OF THIS PATIENT: 40  minutes.  D/w DR Rickey Primus M.D on 05/05/2015 at 12:53 PM  Between 7am to 6pm - Pager - [phone]  After 6pm go to [email or web address] - password [email or web address] [facility]  Office  [phone]  CC: Primary care physician; Rubbie Battiest, NP

## 2015-05-05 NOTE — Progress Notes (Signed)
   05/05/15 1030  Clinical Encounter Type  Visited With Patient  Visit Type Initial  Spiritual Encounters  Spiritual Needs Prayer;Emotional  Stress Factors  Patient Stress Factors Health changes  Visited with patient.  Patient was pleasant but stated she is a bit anxious with her health.  She stated that she hopes the doctors discover what is wrong so she is treat it and return home.  [place] [phone]

## 2015-05-05 NOTE — Consult Note (Signed)
Pt hemoglobin was 7.8, was 9.  Given her blood thinner I would transfuse one more unit.  VSS afeb. Some crampy queasy feeling after stool.  Chest clear, heart RRR, abd neg, eyes without icterus, conj pale  A: probably UGI bleed due to blood thinner.  Try EGD tomorrow with Dr, Gustavo Lah.

## 2015-05-05 NOTE — Plan of Care (Signed)
Problem: Discharge Progression Outcomes Goal: Discharge plan in place and appropriate Outcome: Progressing Individualization of Care:   PMH: atrial fibrillation, COPD, rheumatoid arthritis, neuropathy, GERD - controlled by home medications.  Goal: Other Discharge Outcomes/Goals Plan of care progress to goal:  No active signs of bleeding. Protonix drip infusing at 25 ml/hr. Assistance provided to bathroom. Patient c/o headache -Tylenol given with relief.

## 2015-05-05 NOTE — Plan of Care (Signed)
Problem: Discharge Progression Outcomes Goal: Other Discharge Outcomes/Goals Outcome: Progressing Hbg down this morning, 1 unit pRBCs given, tolerating well. Ice chips only. EGD scheduled for tomorrow

## 2015-05-05 NOTE — Progress Notes (Signed)
Post blood vitals  

## 2015-05-06 ENCOUNTER — Encounter
Admission: EM | Disposition: A | Discharge status: Home / Self Care | Payer: Self-pay | Source: Home / Self Care | Attending: Internal Medicine

## 2015-05-06 ENCOUNTER — Inpatient Hospital Stay: Payer: Medicare Other | Admitting: Anesthesiology

## 2015-05-06 ENCOUNTER — Encounter: Payer: Self-pay | Admitting: *Deleted

## 2015-05-06 HISTORY — PX: ESOPHAGOGASTRODUODENOSCOPY: SHX5428

## 2015-05-06 LAB — HEMATOCRIT: HCT: 29.3 % — ABNORMAL LOW (ref 35.0–47.0)

## 2015-05-06 LAB — GLUCOSE, CAPILLARY
GLUCOSE-CAPILLARY: 102 mg/dL — AB (ref 70–99)
GLUCOSE-CAPILLARY: 91 mg/dL (ref 70–99)
GLUCOSE-CAPILLARY: 109 mg/dL — AB (ref 70–99)
Glucose-Capillary: 98 mg/dL (ref 70–99)

## 2015-05-06 LAB — APTT: aPTT: 29 seconds (ref 24–36)

## 2015-05-06 LAB — HEMOGLOBIN
Hemoglobin: 9.2 g/dL — ABNORMAL LOW (ref 12.0–16.0)
Hemoglobin: 9.1 g/dL — ABNORMAL LOW (ref 12.0–16.0)

## 2015-05-06 LAB — PROTIME-INR
INR: 1.27
Prothrombin Time: 16.1 seconds — ABNORMAL HIGH (ref 11.4–15.0)

## 2015-05-06 SURGERY — EGD (ESOPHAGOGASTRODUODENOSCOPY)
Anesthesia: Monitor Anesthesia Care

## 2015-05-06 MED ORDER — PROPOFOL INFUSION 10 MG/ML OPTIME
INTRAVENOUS | Status: DC | PRN
  Administered 2015-05-06: 75 ug/kg/min via INTRAVENOUS

## 2015-05-06 MED ORDER — SODIUM CHLORIDE 0.9 % IV SOLN: INTRAVENOUS | Status: DC

## 2015-05-06 MED ORDER — AMIODARONE HCL 200 MG PO TABS
200.0000 mg | ORAL_TABLET | Freq: Two times a day (BID) | ORAL | Status: DC
  Administered 2015-05-06 – 2015-05-08 (×4): 200 mg via ORAL
  Filled 2015-05-06 (×5): qty 1

## 2015-05-06 MED ORDER — PROPOFOL 10 MG/ML IV BOLUS
INTRAVENOUS | Status: DC | PRN
  Administered 2015-05-06: 50 mg via INTRAVENOUS

## 2015-05-06 MED ORDER — LIDOCAINE HCL (CARDIAC) 20 MG/ML IV SOLN
INTRAVENOUS | Status: DC | PRN
  Administered 2015-05-06 (×2): 50 mg via INTRAVENOUS

## 2015-05-06 MED ORDER — GLYCOPYRROLATE 0.2 MG/ML IJ SOLN
INTRAMUSCULAR | Status: DC | PRN
  Administered 2015-05-06: 0.2 mg via INTRAVENOUS

## 2015-05-06 NOTE — Plan of Care (Signed)
Problem: Discharge Progression Outcomes Goal: Other Discharge Outcomes/Goals Outcome: Progressing Pt had EGD today. Protonix gtt infusing, no active signs of bleeding. Plan for colonoscopy on Monday, per Dr. Gustavo Lah. IVF infusing as well. C/o headache, tylenol given PRN. Pt stated relief.

## 2015-05-06 NOTE — Progress Notes (Signed)
Subjective: Patient seen for melena. Patient was admitted to the hospital about 2 days ago after a couple of days of seeing some black stools. He is been taking NSAIDs/Advil at home regular basis due to her history of joint arthritis. He also has to take history of atrial fibrillation. She has been doing well since her admission. Her last bowel movement was yesterday morning that being black. Since then he has been passing flatus and some greenish watery material. She denies any nausea vomiting or abdominal pain.  Objective: Vital signs in last 24 hours: Temp:  [97.8 F (36.6 C)-100.6 F (38.1 C)] 100 F (37.8 C) (05/07 0842) Pulse Rate:  [76-87] 80 (05/07 0842) Resp:  [18-22] 18 (05/07 0842) BP: (113-145)/(42-95) 125/61 mmHg (05/07 0842) SpO2:  [93 %-97 %] 97 % (05/07 0842) Blood pressure 125/61, pulse 80, temperature 100 F (37.8 C), temperature source Tympanic, resp. rate 18, height 5\' 6"  (1.676 m), weight 90.266 kg (199 lb), SpO2 97 %.   Intake/Output from previous day: 05/06 0701 - 05/07 0700 In: 667 [I.V.:20; Blood:647] Out: -   Intake/Output this shift:     General appearance:  Well-nourished well-appearing Caucasian female no acute distress Resp:  Clear to auscultation bilaterally Cardio:  Rate regular rate and rhythm without rub or gallop GI:  Soft nontender nondistended bowel sounds positive normoactive Extremities:  No clubbing cyanosis or edema   Lab Results: Results for orders placed or performed during the hospital encounter of 05/04/15 (from the past 24 hour(s))  Glucose, capillary     Status: None   Collection Time: 05/05/15 11:20 AM  Result Value Ref Range   Glucose-Capillary 90 70 - 99 mg/dL  Prepare RBC     Status: None   Collection Time: 05/05/15 11:45 AM  Result Value Ref Range   Order Confirmation ORDERS RECEIVED TO CROSSMATCH   Hemoglobin and Hematocrit     Status: Abnormal   Collection Time: 05/05/15  1:12 PM  Result Value Ref Range   Hemoglobin  8.0 (L) 12.0 - 16.0 g/dL   HCT 26.1 (L) 35.0 - 47.0 %  Glucose, capillary     Status: None   Collection Time: 05/05/15  4:26 PM  Result Value Ref Range   Glucose-Capillary 77 70 - 99 mg/dL  Hemoglobin     Status: Abnormal   Collection Time: 05/05/15  9:42 PM  Result Value Ref Range   Hemoglobin 9.9 (L) 12.0 - 16.0 g/dL  Glucose, capillary     Status: None   Collection Time: 05/05/15 10:34 PM  Result Value Ref Range   Glucose-Capillary 80 70 - 99 mg/dL  APTT     Status: None   Collection Time: 05/06/15 12:00 AM  Result Value Ref Range   aPTT 29 24 - 36 seconds  Hemoglobin     Status: Abnormal   Collection Time: 05/06/15  5:44 AM  Result Value Ref Range   Hemoglobin 9.2 (L) 12.0 - 16.0 g/dL  Protime-INR     Status: Abnormal   Collection Time: 05/06/15  5:44 AM  Result Value Ref Range   Prothrombin Time 16.1 (H) 11.4 - 15.0 seconds   INR 1.27   Glucose, capillary     Status: Abnormal   Collection Time: 05/06/15  7:20 AM  Result Value Ref Range   Glucose-Capillary 102 (H) 70 - 99 mg/dL      Recent Labs  05/04/15 1135  05/05/15 0600 05/05/15 1312 05/05/15 2142 05/06/15 0544  WBC 6.3  --  6.7  --   --   --  HGB 9.0*  < > 7.8* 8.0* 9.9* 9.2*  HCT 29.8*  --  26.0* 26.1*  --   --   PLT 177  --  151  --   --   --   < > = values in this interval not displayed. BMET  Recent Labs  05/04/15 1135 05/05/15 0600  NA 139 140  K 3.8 3.6  CL 107 111  CO2 24 25  GLUCOSE 105* 88  BUN 20 11  CREATININE 0.79 0.66  CALCIUM 8.8* 8.1*   LFT  Recent Labs  05/04/15 1135  PROT 6.8  ALBUMIN 3.4*  AST 25  ALT 10*  ALKPHOS 100  BILITOT 0.6   PT/INR  Recent Labs  05/04/15 1135 05/06/15 0544  LABPROT 31.1* 16.1*  INR 2.99 1.27   Hepatitis Panel No results for input(s): HEPBSAG, HCVAB, HEPAIGM, HEPBIGM in the last 72 hours. C-Diff No results for input(s): CDIFFTOX in the last 72 hours. No results for input(s): CDIFFPCR in the last 72 hours.   Studies/Results: No  results found.  Scheduled Inpatient Medications:   . amiodarone  400 mg Oral BID  . budesonide-formoterol  2 puff Inhalation BID  . escitalopram  20 mg Oral Daily  . gabapentin  600 mg Oral QHS  . hydroxychloroquine  200 mg Oral BID  . insulin aspart  0-9 Units Subcutaneous TID WC  . leflunomide  20 mg Oral Daily  . magnesium oxide  400 mg Oral Daily  . metoprolol succinate  25 mg Oral BID  . tiotropium  18 mcg Inhalation Daily    Continuous Inpatient Infusions:   . sodium chloride 50 mL/hr at 05/05/15 1244  . sodium chloride    . pantoprozole (PROTONIX) infusion 8 mg/hr (05/06/15 0417)    PRN Inpatient Medications:  acetaminophen **OR** acetaminophen, albuterol, clonazePAM, predniSONE  Miscellaneous:   Assessment:  Melena/probable upper GI bleed related to NSAID use in the setting of anticoagulant use. Patient now been off while 2 for 2 days and her INR is acceptable for endoscopic evaluation. Currently on IV PPI.  Plan:  EGD. I discussed risks benefits, occasions of EGD to include not limited to bleeding infection perforation and the risk of sedation and she wishes to proceed. I discussed with the patient that she cannot use NSAIDs in the future . She relates she has had difficult to treat toward arthritis and has been on several Biologics as well as methotrexate in the past. At this point I would use tramadol for pain control as regard and she will need to have close follow-up with her rheumatologist after discharge.  Lollie Sails MD 05/06/2015, 9:07 AM

## 2015-05-06 NOTE — Anesthesia Postprocedure Evaluation (Signed)
  Anesthesia Post-op Note  Patient: Brandy Armstrong  Procedure(s) Performed: Procedure(s): ESOPHAGOGASTRODUODENOSCOPY (EGD) (N/A)  Anesthesia type:MAC  Patient location: PACU  Post pain: Pain level controlled  Post assessment: Post-op Vital signs reviewed, Patient's Cardiovascular Status Stable, Respiratory Function Stable, Patent Airway and No signs of Nausea or vomiting  Post vital signs: Reviewed and stable  Last Vitals:  Filed Vitals:   05/06/15 1020  BP: 106/57  Pulse: 81  Temp: 37.5 C  Resp: 14    Level of consciousness: awake, alert  and patient cooperative  Complications: No apparent anesthesia complications

## 2015-05-06 NOTE — Progress Notes (Signed)
[place] [facility] [place] NAME: Brandy Armstrong    MR#:  [id number]  DATE OF BIRTH:  1946-02-13  SUBJECTIVE:  CHIEF COMPLAINT:   Chief Complaint  Patient presents with  . Rectal Bleeding   Denies any black stool in the commode, no liquidy, had EGD today HGb is 9.2, not feeling lightheaded or dizzy at the moment. Complaining of frequent urination and requesting for urinalysis  REVIEW OF SYSTEMS:   CONSTITUTIONAL: No fever, fatigue or weakness.  EYES: No blurred or double vision.  EARS, NOSE, AND THROAT: No tinnitus or ear pain.  RESPIRATORY: No cough, shortness of breath, wheezing or hemoptysis.  CARDIOVASCULAR: No chest pain, orthopnea, edema.  GASTROINTESTINAL: No nausea, vomiting, diarrhea or has lower abdominal pain.  GENITOURINARY: Complaining of frequent urination. No dysuria, hematuria.  ENDOCRINE: No polyuria, nocturia,  HEMATOLOGY: No anemia, easy bruising , admits of rectal bleeding, black stool past 5 days, worsened past 3 days SKIN: No rash or lesion. MUSCULOSKELETAL: No joint pain or arthritis.   NEUROLOGIC: No tingling, numbness, weakness.  PSYCHIATRY: No anxiety or depression.   VITAL SIGNS: Blood pressure 126/65, pulse 78, temperature 98.1 F (36.7 C), temperature source Oral, resp. rate 20, height 5\' 6"  (1.676 m), weight 90.266 kg (199 lb), SpO2 93 %.  PHYSICAL EXAMINATION:   GENERAL:  69 y.o.-year-old patient lying in the bed with no acute distress. Sitting on the commode, wiping , stool is noted green to black, soft, floating in the commode.  EYES: Pupils equal, round, reactive to light and accommodation. No scleral icterus. Extraocular muscles intact.  HEENT: Head atraumatic, normocephalic. Oropharynx and nasopharynx clear.  NECK:  Supple, no jugular venous distention. No thyroid enlargement, no tenderness.  LUNGS: Normal breath sounds bilaterally, no wheezing, rales,rhonchi or crepitation. No use of accessory muscles  of respiration.  CARDIOVASCULAR: S1, S2 normal. No murmurs, rubs, or gallops.  ABDOMEN: Soft, t minimal lower abdominal tenderness, no rebound tenderness, nondistended. Bowel sounds present. No organomegaly or mass.  EXTREMITIES: No pedal edema, cyanosis, or clubbing.  NEUROLOGIC: Cranial nerves II through XII are intact. Muscle strength 5/5 in all extremities. Sensation intact. Gait not checked.  PSYCHIATRIC: The patient is alert and oriented x 3.  SKIN: No obvious rash, lesion, or ulcer.   ORDERS/RESULTS REVIEWED:   CBC  Recent Labs Lab 05/04/15 1135  05/04/15 2159 05/05/15 0600 05/05/15 1312 05/05/15 2142 05/06/15 0544  WBC 6.3  --   --  6.7  --   --   --   HGB 9.0*  < > 8.4* 7.8* 8.0* 9.9* 9.2*  HCT 29.8*  --   --  26.0* 26.1*  --   --   PLT 177  --   --  151  --   --   --   MCV 73.1*  --   --  72.8*  --   --   --   MCH 22.2*  --   --  21.9*  --   --   --   MCHC 30.3*  --   --  30.1*  --   --   --   RDW 17.8*  --   --  17.6*  --   --   --   < > = values in this interval not displayed. ------------------------------------------------------------------------------------------------------------------  Chemistries   Recent Labs Lab 05/04/15 1135 05/05/15 0600  NA 139 140  K 3.8 3.6  CL 107 111  CO2 24 25  GLUCOSE 105* 88  BUN 20 11  CREATININE 0.79 0.66  CALCIUM 8.8* 8.1*  AST 25  --   ALT 10*  --   ALKPHOS 100  --   BILITOT 0.6  --    ------------------------------------------------------------------------------------------------------------------ estimated creatinine clearance is 78.3 mL/min (by C-G formula based on Cr of 0.66). ------------------------------------------------------------------------------------------------------------------ No results for input(s): TSH, T4TOTAL, T3FREE, THYROIDAB in the last 72 hours.  Invalid input(s): FREET3  Cardiac Enzymes No results for input(s): CKMB, TROPONINI, MYOGLOBIN in the last 168 hours.  Invalid input(s):  CK ------------------------------------------------------------------------------------------------------------------ Invalid input(s): POCBNP ---------------------------------------------------------------------------------------------------------------  RADIOLOGY: No results found.  EKG:  Orders placed or performed during the hospital encounter of 05/04/15  . EKG 12-Lead  . EKG 12-Lead    ASSESSMENT AND PLAN: #1 acute gastrointestinal bleed secondary to NSAIDs.  EGD today on May 7 has revealed acute gastritis probably NSAID induced. No active bleedingNow off  Xarelto and Advil. GI has recommended to start her on clear liquid diet with no reds,, continue proton pump inhibitor and scheduled for colonoscopy on Monday   #2 Acute blood loss anemia.  Check hemoglobin and hematocrit every 12 hours. We will transfuse blood as needed basis-continue Protonix drip.  #3 atrial fibrillation ,  now in normal sinus rhythm. -Risk of stroke is higher as she is  off Xarelto. -Rate controlled on current medications and maintained in sinus rhythm on amiodarone.  #4 rheumatoid arthritis with joint flare right wrist. -We'll continue usual medications for rheumatoid arthritis. -Prednisone 10 mg daily.  #5 COPD -Continue Symbicort and Spiriva. Stable  #6 neuropathy -On gabapentin.  #7 anxiety and depression -Continue psychiatric medications  #8 urinary frequency- Will get urinalysis and urine culture and sensitivity not considering antibiotics at this time Plan of care discussed in detail with the patient. She verbalized understanding of the plan . DRUG ALLERGIES:  Allergies  Allergen Reactions  . Codeine Nausea Only  . Latex     CODE STATUS full code  TOTAL TIME TAKING CARE OF THIS PATIENT: 35 minutes.    Nicholes Mango M.D on 05/06/2015 at 3:28 PM  Between 7am to 6pm - Pager - [phone]  After 6pm go to [email or web address] - password [email or web address] [facility]  Office   [phone]  CC: Primary care physician; Rubbie Battiest, NP

## 2015-05-06 NOTE — Anesthesia Postprocedure Evaluation (Signed)
  Anesthesia Post-op Note  Patient: Brandy Armstrong  Procedure(s) Performed: Procedure(s): ESOPHAGOGASTRODUODENOSCOPY (EGD) (N/A)  Anesthesia type:MAC  Patient location: PACU  Post pain: Pain level controlled  Post assessment: Post-op Vital signs reviewed, Patient's Cardiovascular Status Stable, Respiratory Function Stable, Patent Airway and No signs of Nausea or vomiting  Post vital signs: Reviewed and stable  Last Vitals:  Filed Vitals:   05/06/15 1040  BP: 132/72  Pulse: 82  Temp:   Resp: 22    Level of consciousness: awake, alert  and patient cooperative  Complications: No apparent anesthesia complications

## 2015-05-06 NOTE — Progress Notes (Signed)
Please see full EGD report. There is evidence of gastritis likely secondary to her NSAID use, as no evidence of active bleeding lesion or stigmata of recent bleeding. Plan for colonoscopy on Monday. Continue PPI for now can change to twice daily by mouth. Clear liquid diet no red items.

## 2015-05-06 NOTE — Anesthesia Preprocedure Evaluation (Addendum)
Anesthesia Evaluation  Patient identified by MRN, date of birth, ID band Patient awake    Reviewed: Allergy & Precautions, NPO status , Patient's Chart, lab work & pertinent test results, reviewed documented beta blocker date and time   History of Anesthesia Complications Negative for: history of anesthetic complications  Airway Mallampati: II  TM Distance: >3 FB Neck ROM: Full    Dental  (+) Upper Dentures, Lower Dentures   Pulmonary COPD COPD inhaler, former smoker,  breath sounds clear to auscultation  Pulmonary exam normal       Cardiovascular Exercise Tolerance: Poor negative cardio ROS Normal cardiovascular exam+ dysrhythmias Atrial Fibrillation Rhythm:Regular Rate:Normal     Neuro/Psych PSYCHIATRIC DISORDERS Anxiety Depression negative neurological ROS     GI/Hepatic Neg liver ROS, GERD-  ,Barrett's esophagus   Endo/Other  negative endocrine ROS  Renal/GU negative Renal ROS  negative genitourinary   Musculoskeletal  (+) Arthritis -, Rheumatoid disorders,    Abdominal   Peds negative pediatric ROS (+)  Hematology negative hematology ROS (+)   Anesthesia Other Findings   Reproductive/Obstetrics negative OB ROS                           Anesthesia Physical Anesthesia Plan  ASA: III  Anesthesia Plan: MAC   Post-op Pain Management:    Induction: Intravenous  Airway Management Planned: Nasal Cannula  Additional Equipment:   Intra-op Plan:   Post-operative Plan:   Informed Consent: I have reviewed the patients History and Physical, chart, labs and discussed the procedure including the risks, benefits and alternatives for the proposed anesthesia with the patient or authorized representative who has indicated his/her understanding and acceptance.     Plan Discussed with: CRNA and Surgeon  Anesthesia Plan Comments:         Anesthesia Quick Evaluation

## 2015-05-06 NOTE — Plan of Care (Signed)
Problem: Discharge Progression Outcomes Goal: Barriers To Progression Addressed/Resolved Outcome: Progressing Individualization of Care:   PMH: atrial fibrillation, COPD, rheumatoid arthritis, neuropathy, GERD - controlled by home medications.      Goal: Other Discharge Outcomes/Goals Outcome: Progressing   No active signs of bleeding. Protonix drip infusing at 25 ml/hr. Assistance provided to bathroom. Patient c/o headache -Tylenol given with relief. EGD scheduled for this am., npo after midnight

## 2015-05-06 NOTE — Op Note (Signed)
[facility] Gastroenterology Patient Name: Brandy Armstrong Procedure Date: 05/06/2015 10:05 AM MRN: [id number] Account #: [id number] Date of Birth: 1946/11/24 Admit Type: Inpatient Age: 69 Room: [facility] ENDO ROOM 4 Gender: Female Note Status: Finalized Procedure:         Upper GI endoscopy Indications:       [facility] Providers:         Lollie Sails, MD Medicines:         Monitored Anesthesia Care Complications:     No immediate complications. Procedure:         Pre-Anesthesia Assessment:                    - ASA Grade Assessment: III - A patient with severe                     systemic disease.                    After obtaining informed consent, the endoscope was passed                     under direct vision. Throughout the procedure, the                     patient's blood pressure, pulse, and oxygen saturations                     were monitored continuously. The Endoscope was introduced                     through the mouth, and advanced to the third part of                     duodenum. The upper GI endoscopy was accomplished without                     difficulty. The patient tolerated the procedure well. Findings:      The Z-line was variable.      The examined esophagus was normal.      Patchy mild inflammation characterized by congestion (edema) and       erythema was found in the gastric antrum.      The cardia and gastric fundus were normal on retroflexion.      The examined duodenum was normal.      no stigmata or evidence of a recently bleeding lesion. Impression:        - Z-line variable.                    - Normal esophagus.                    - Gastritis.                    - Normal examined duodenum.                    - No specimens collected. Recommendation:    - Use Protonix (pantoprazole) 40 mg PO BID for 1 month.                    - Use Protonix (pantoprazole) 40 mg PO daily daily.                    - No aspirin, ibuprofen, naproxen, or  other  non-steroidal                     anti-inflammatory drugs.                    - Consider tramadol for arthritic pain, close post d/c                     follup with her Rheumatologist. Procedure Code(s): --- Professional ---                    [phone], Esophagogastroduodenoscopy, flexible, transoral;                     diagnostic, including collection of specimen(s) by                     brushing or washing, when performed (separate procedure) Diagnosis Code(s): --- Professional ---                    530.89, Other specified disorders of esophagus                    535.50, Unspecified gastritis and gastroduodenitis,                     without mention of hemorrhage                    578.1, Blood in stool CPT copyright 2014 American Medical Association. All rights reserved. The codes documented in this report are preliminary and upon coder review may  be revised to meet current compliance requirements. Lollie Sails, MD 05/06/2015 10:20:34 AM This report has been signed electronically. Number of Addenda: 0 Note Initiated On: 05/06/2015 10:05 AM      [facility]

## 2015-05-06 NOTE — Transfer of Care (Signed)
Immediate Anesthesia Transfer of Care Note  Patient: Brandy Armstrong  Procedure(s) Performed: Procedure(s): ESOPHAGOGASTRODUODENOSCOPY (EGD) (N/A)  Patient Location: PACU and Endoscopy Unit  Anesthesia Type:MAC  Level of Consciousness: awake, alert  and oriented  Airway & Oxygen Therapy: Patient Spontanous Breathing and Patient connected to nasal cannula oxygen  Post-op Assessment: Report given to RN and Post -op Vital signs reviewed and stable  Post vital signs: Reviewed and stable  Last Vitals:  Filed Vitals:   05/06/15 1020  BP: 106/57  Pulse: 81  Temp: 37.5 C  Resp: 14    Complications: No apparent anesthesia complications

## 2015-05-07 LAB — URINALYSIS COMPLETE WITH MICROSCOPIC (ARMC ONLY)
BACTERIA UA: NONE SEEN
BILIRUBIN URINE: NEGATIVE
GLUCOSE, UA: NEGATIVE mg/dL
HGB URINE DIPSTICK: NEGATIVE
KETONES UR: NEGATIVE mg/dL
LEUKOCYTES UA: NEGATIVE
Nitrite: NEGATIVE
PH: 7 (ref 5.0–8.0)
Protein, ur: NEGATIVE mg/dL
SPECIFIC GRAVITY, URINE: 1.005 (ref 1.005–1.030)

## 2015-05-07 LAB — BASIC METABOLIC PANEL
Anion gap: 8 (ref 5–15)
CALCIUM: 8.2 mg/dL — AB (ref 8.9–10.3)
CO2: 24 mmol/L (ref 22–32)
Chloride: 108 mmol/L (ref 101–111)
Creatinine, Ser: 0.61 mg/dL (ref 0.44–1.00)
GFR calc non Af Amer: >60 mL/min (ref >60)
GLUCOSE: 106 mg/dL — AB (ref 65–99)
Potassium: 3.1 mmol/L — ABNORMAL LOW (ref 3.5–5.1)
SODIUM: 140 mmol/L (ref 135–145)

## 2015-05-07 LAB — GLUCOSE, CAPILLARY
GLUCOSE-CAPILLARY: 93 mg/dL (ref 70–99)
Glucose-Capillary: 131 mg/dL — ABNORMAL HIGH (ref 70–99)
Glucose-Capillary: 83 mg/dL (ref 70–99)
Glucose-Capillary: 87 mg/dL (ref 70–99)

## 2015-05-07 LAB — CBC
HEMATOCRIT: 29.9 % — AB (ref 35.0–47.0)
Hemoglobin: 9.3 g/dL — ABNORMAL LOW (ref 12.0–16.0)
MCH: 22.8 pg — AB (ref 26.0–34.0)
MCHC: 31 g/dL — AB (ref 32.0–36.0)
MCV: 73.6 fL — ABNORMAL LOW (ref 80.0–100.0)
Platelets: 164 10*3/uL (ref 150–440)
RBC: 4.06 MIL/uL (ref 3.80–5.20)
RDW: 17.8 % — ABNORMAL HIGH (ref 11.5–14.5)
WBC: 8.1 10*3/uL (ref 3.6–11.0)

## 2015-05-07 MED ORDER — PEG 3350-KCL-NA BICARB-NACL 420 G PO SOLR
3000.0000 mL | Freq: Once | ORAL | Status: AC
  Administered 2015-05-07: 20:00:00 3000 mL via ORAL
  Filled 2015-05-07: qty 4000

## 2015-05-07 MED ORDER — POTASSIUM CHLORIDE 20 MEQ PO PACK
40.0000 meq | PACK | Freq: Once | ORAL | Status: AC
  Administered 2015-05-07: 09:00:00 40 meq via ORAL
  Filled 2015-05-07: qty 2

## 2015-05-07 MED ORDER — PANTOPRAZOLE SODIUM 40 MG IV SOLR
40.0000 mg | Freq: Two times a day (BID) | INTRAVENOUS | Status: DC
  Administered 2015-05-07 – 2015-05-08 (×3): 40 mg via INTRAVENOUS
  Filled 2015-05-07 (×4): qty 40

## 2015-05-07 NOTE — Progress Notes (Signed)
[place] at [place] NAME: Brandy Armstrong    MR#:  [id number]  DATE OF BIRTH:  12/24/1946  SUBJECTIVE:  CHIEF COMPLAINT:   Chief Complaint  Patient presents with  . Rectal Bleeding   Denies any black stool in the commode, having liquidy stools, on liquid diet only, had EGD yesterday HGb is 9.3 today, not feeling lightheaded or dizzy at the moment.  REVIEW OF SYSTEMS:   CONSTITUTIONAL: No fever, fatigue or weakness.  EYES: No blurred or double vision.  EARS, NOSE, AND THROAT: No tinnitus or ear pain.  RESPIRATORY: No cough, shortness of breath, wheezing or hemoptysis.  CARDIOVASCULAR: No chest pain, orthopnea, edema.  GASTROINTESTINAL: No nausea, vomiting, diarrhea or has lower abdominal pain.  GENITOURINARY: Complaining of frequent urination. No dysuria, hematuria.  ENDOCRINE: No polyuria, nocturia,  HEMATOLOGY: No anemia, easy bruising , admits of rectal bleeding, black stool past 5 days, worsened past 3 days SKIN: No rash or lesion. MUSCULOSKELETAL: No joint pain or arthritis.   NEUROLOGIC: No tingling, numbness, weakness.  PSYCHIATRY: No anxiety or depression.   VITAL SIGNS: Blood pressure 146/67, pulse 79, temperature 98.3 F (36.8 C), temperature source Oral, resp. rate 20, height 5\' 6"  (1.676 m), weight 90.266 kg (199 lb), SpO2 96 %.  PHYSICAL EXAMINATION:   GENERAL:  69 y.o.-year-old patient lying in the bed with no acute distress. Sitting on the commode, wiping , stool is noted green to black, soft, floating in the commode.  EYES: Pupils equal, round, reactive to light and accommodation. No scleral icterus. Extraocular muscles intact.  HEENT: Head atraumatic, normocephalic. Oropharynx and nasopharynx clear.  NECK:  Supple, no jugular venous distention. No thyroid enlargement, no tenderness.  LUNGS: Normal breath sounds bilaterally, no wheezing, rales,rhonchi or crepitation. No use of accessory muscles of respiration.   CARDIOVASCULAR: S1, S2 normal. No murmurs, rubs, or gallops.  ABDOMEN: Soft, t minimal lower abdominal tenderness, no rebound tenderness, nondistended. Bowel sounds present. No organomegaly or mass.  EXTREMITIES: No pedal edema, cyanosis, or clubbing.  NEUROLOGIC: Cranial nerves II through XII are intact. Muscle strength 5/5 in all extremities. Sensation intact. Gait not checked.  PSYCHIATRIC: The patient is alert and oriented x 3.  SKIN: No obvious rash, lesion, or ulcer.   ORDERS/RESULTS REVIEWED:   CBC  Recent Labs Lab 05/04/15 1135  05/05/15 0600 05/05/15 1312 05/05/15 2142 05/06/15 0544 05/06/15 1738 05/07/15 0555  WBC 6.3  --  6.7  --   --   --   --  8.1  HGB 9.0*  < > 7.8* 8.0* 9.9* 9.2* 9.1* 9.3*  HCT 29.8*  --  26.0* 26.1*  --   --  29.3* 29.9*  PLT 177  --  151  --   --   --   --  164  MCV 73.1*  --  72.8*  --   --   --   --  73.6*  MCH 22.2*  --  21.9*  --   --   --   --  22.8*  MCHC 30.3*  --  30.1*  --   --   --   --  31.0*  RDW 17.8*  --  17.6*  --   --   --   --  17.8*  < > = values in this interval not displayed. ------------------------------------------------------------------------------------------------------------------  Chemistries   Recent Labs Lab 05/04/15 1135 05/05/15 0600 05/07/15 0555  NA 139 140 140  K 3.8 3.6 3.1*  CL  107 111 108  CO2 24 25 24   GLUCOSE 105* 88 106*  BUN 20 11 <5*  CREATININE 0.79 0.66 0.61  CALCIUM 8.8* 8.1* 8.2*  AST 25  --   --   ALT 10*  --   --   ALKPHOS 100  --   --   BILITOT 0.6  --   --    ------------------------------------------------------------------------------------------------------------------ estimated creatinine clearance is 78.3 mL/min (by C-G formula based on Cr of 0.61). ------------------------------------------------------------------------------------------------------------------ No results for input(s): TSH, T4TOTAL, T3FREE, THYROIDAB in the last 72 hours.  Invalid input(s):  FREET3  Cardiac Enzymes No results for input(s): CKMB, TROPONINI, MYOGLOBIN in the last 168 hours.  Invalid input(s): CK ------------------------------------------------------------------------------------------------------------------ Invalid input(s): POCBNP ---------------------------------------------------------------------------------------------------------------  RADIOLOGY: No results found.  EKG:  Orders placed or performed during the hospital encounter of 05/04/15  . EKG 12-Lead  . EKG 12-Lead    ASSESSMENT AND PLAN: #1 acute gastrointestinal bleed secondary to NSAIDs.  EGD  on May 7 has revealed acute gastritis probably NSAID induced. No active bleedingNow off  Xarelto and Advil. GI has recommended to continue  her on clear liquid diet with no reds,, continue proton pump inhibitor and scheduled for colonoscopy on Monday   #2 Acute blood loss anemia.  Check hemoglobin and hematocrit every 12 hours. We will transfuse blood as needed basis-continue Protonix q 12 hrs   #3 atrial fibrillation ,  now in normal sinus rhythm. -Risk of stroke is higher as she is  off Xarelto. -Rate controlled on current medications and maintained in sinus rhythm on amiodarone.  #4 rheumatoid arthritis with joint flare right wrist. -We'll continue usual medications for rheumatoid arthritis. -Prednisone 10 mg daily.  #5 COPD -Continue Symbicort and Spiriva. Stable  #6 neuropathy -On gabapentin.  #7 anxiety and depression -Continue psychiatric medications  #8 hypokalemia We will provide supplements with potassium and check potassium and magnesium level in a.m.  #9 urinary frequency- urinalysis and urine culture and sensitivity not considering antibiotics at this time Plan of care discussed in detail with the patient. She verbalized understanding of the plan  Anticipate discharge in a.m. if patient is clinically stable after colonoscopy and can tolerate diet . DRUG ALLERGIES:   Allergies  Allergen Reactions  . Codeine Nausea Only  . Latex     CODE STATUS full code  TOTAL TIME TAKING CARE OF THIS PATIENT: 35 minutes.    Nicholes Mango M.D on 05/07/2015 at 3:19 PM  Between 7am to 6pm - Pager - [phone]  After 6pm go to [email or web address] - password [email or web address] [facility]  Office  [phone]  CC: Primary care physician; Rubbie Battiest, NP

## 2015-05-07 NOTE — Plan of Care (Signed)
Problem: Discharge Progression Outcomes Goal: Other Discharge Outcomes/Goals Outcome: Progressing Pt denies complaints this shift; pt scheduled for Colonoscopy 05/08/15 to start Prep tonight

## 2015-05-07 NOTE — Progress Notes (Signed)
Subjective: Patient seen for GI bleeding/melena. Patient is no longer having any bloody stool. Her last bowel movement was diarrhea however without evidence of bleeding. He denies any nausea vomiting or abdominal pain.  Objective: Vital signs in last 24 hours: Temp:  [97.7 F (36.5 C)-98.3 F (36.8 C)] 98.3 F (36.8 C) (05/08 0610) Pulse Rate:  [79-93] 79 (05/08 1504) Resp:  [16-20] 20 (05/08 1504) BP: (136-166)/(67-84) 146/67 mmHg (05/08 1504) SpO2:  [96 %-100 %] 96 % (05/08 1504) Blood pressure 146/67, pulse 79, temperature 98.3 F (36.8 C), temperature source Oral, resp. rate 20, height 5\' 6"  (1.676 m), weight 90.266 kg (199 lb), SpO2 96 %.   Intake/Output from previous day: 05/07 0701 - 05/08 0700 In: 1140 [P.O.:840; I.V.:300] Out: -   Intake/Output this shift: Total I/O In: 4014 [P.O.:900; I.V.:3114] Out: -    General appearance:  A well-nourished well-appearing female no acute distress Resp:  Bilaterally clear to auscultation Cardio: Regular rate and rhythm GI:  Soft nontender nondistended bowel sounds positive and normoactive Extremities:  No clubbing cyanosis or edema   Lab Results: Results for orders placed or performed during the hospital encounter of 05/04/15 (from the past 24 hour(s))  Hemoglobin     Status: Abnormal   Collection Time: 05/06/15  5:38 PM  Result Value Ref Range   Hemoglobin 9.1 (L) 12.0 - 16.0 g/dL  Hematocrit     Status: Abnormal   Collection Time: 05/06/15  5:38 PM  Result Value Ref Range   HCT 29.3 (L) 35.0 - 47.0 %  Glucose, capillary     Status: None   Collection Time: 05/06/15 10:23 PM  Result Value Ref Range   Glucose-Capillary 98 70 - 99 mg/dL  CBC     Status: Abnormal   Collection Time: 05/07/15  5:55 AM  Result Value Ref Range   WBC 8.1 3.6 - 11.0 K/uL   RBC 4.06 3.80 - 5.20 MIL/uL   Hemoglobin 9.3 (L) 12.0 - 16.0 g/dL   HCT 29.9 (L) 35.0 - 47.0 %   MCV 73.6 (L) 80.0 - 100.0 fL   MCH 22.8 (L) 26.0 - 34.0 pg   MCHC 31.0 (L)  32.0 - 36.0 g/dL   RDW 17.8 (H) 11.5 - 14.5 %   Platelets 164 150 - 440 K/uL  Basic metabolic panel     Status: Abnormal   Collection Time: 05/07/15  5:55 AM  Result Value Ref Range   Sodium 140 135 - 145 mmol/L   Potassium 3.1 (L) 3.5 - 5.1 mmol/L   Chloride 108 101 - 111 mmol/L   CO2 24 22 - 32 mmol/L   Glucose, Bld 106 (H) 65 - 99 mg/dL   BUN <5 (L) 6 - 20 mg/dL   Creatinine, Ser 0.61 0.44 - 1.00 mg/dL   Calcium 8.2 (L) 8.9 - 10.3 mg/dL   GFR calc non Af Amer >60 >60 mL/min   GFR calc Af Amer >60 >60 mL/min   Anion gap 8 5 - 15  Glucose, capillary     Status: Abnormal   Collection Time: 05/07/15  7:21 AM  Result Value Ref Range   Glucose-Capillary 131 (H) 70 - 99 mg/dL  Glucose, capillary     Status: None   Collection Time: 05/07/15 11:34 AM  Result Value Ref Range   Glucose-Capillary 83 70 - 99 mg/dL  Glucose, capillary     Status: None   Collection Time: 05/07/15  4:40 PM  Result Value Ref Range   Glucose-Capillary 93  70 - 99 mg/dL      Recent Labs  05/05/15 0600 05/05/15 1312  05/06/15 0544 05/06/15 1738 05/07/15 0555  WBC 6.7  --   --   --   --  8.1  HGB 7.8* 8.0*  < > 9.2* 9.1* 9.3*  HCT 26.0* 26.1*  --   --  29.3* 29.9*  PLT 151  --   --   --   --  164  < > = values in this interval not displayed. BMET  Recent Labs  05/05/15 0600 05/07/15 0555  NA 140 140  K 3.6 3.1*  CL 111 108  CO2 25 24  GLUCOSE 88 106*  BUN 11 <5*  CREATININE 0.66 0.61  CALCIUM 8.1* 8.2*   LFT No results for input(s): PROT, ALBUMIN, AST, ALT, ALKPHOS, BILITOT, BILIDIR, IBILI in the last 72 hours. PT/INR  Recent Labs  05/06/15 0544  LABPROT 16.1*  INR 1.27   Hepatitis Panel No results for input(s): HEPBSAG, HCVAB, HEPAIGM, HEPBIGM in the last 72 hours. C-Diff No results for input(s): CDIFFTOX in the last 72 hours. No results for input(s): CDIFFPCR in the last 72 hours.   Studies/Results: No results found.  Scheduled Inpatient Medications:   . amiodarone  200  mg Oral BID  . budesonide-formoterol  2 puff Inhalation BID  . escitalopram  20 mg Oral Daily  . gabapentin  600 mg Oral QHS  . hydroxychloroquine  200 mg Oral BID  . insulin aspart  0-9 Units Subcutaneous TID WC  . leflunomide  20 mg Oral Daily  . magnesium oxide  400 mg Oral Daily  . metoprolol succinate  25 mg Oral BID  . pantoprazole (PROTONIX) IV  40 mg Intravenous Q12H  . tiotropium  18 mcg Inhalation Daily    Continuous Inpatient Infusions:   . sodium chloride 50 mL/hr at 05/06/15 1207    PRN Inpatient Medications:  acetaminophen **OR** acetaminophen, albuterol, clonazePAM, predniSONE  Miscellaneous:   Assessment:  1. Melena resolved, hemodynamically stable, hemogram improving. EGD showing a gastritis likely NSAID related however no evidence of a bleeding lesion or stigmata of recent bleeding. It is of note that the patient has a history of colon polyps and is overdue for colonoscopy. We will proceed with this while she is in the hospital.  Plan:  1. continue PPI will reduce to once a day by mouth 2. Colonoscopy tomorrow probably by Dr. Vira Agar possibly by myself. I have discussed the risks benefits and complications of this procedure to include not limited to bleeding infection perforation and the risk of sedation and she wishes to proceed. If this is uninformative as well will likely need a video capsule endoscopy which can be done as an outpatient in follow-up. Once the colonoscopy is done I would restart her anticoagulant.  Lollie Sails MD 05/07/2015, 5:04 PM

## 2015-05-07 NOTE — Plan of Care (Signed)
Problem: Discharge Progression Outcomes Goal: Other Discharge Outcomes/Goals Outcome: Progressing  IVF and Protonix gtt continues , no active signs of bleeding. Plan for colonoscopy on Monday, per Dr. Gustavo Lah.

## 2015-05-08 ENCOUNTER — Encounter
Admission: EM | Disposition: A | Discharge status: Home / Self Care | Payer: Self-pay | Source: Home / Self Care | Attending: Internal Medicine

## 2015-05-08 ENCOUNTER — Inpatient Hospital Stay: Payer: Medicare Other | Admitting: Anesthesiology

## 2015-05-08 ENCOUNTER — Encounter: Payer: Self-pay | Admitting: Anesthesiology

## 2015-05-08 HISTORY — PX: COLONOSCOPY: SHX5424

## 2015-05-08 LAB — TYPE AND SCREEN
ABO/RH(D): O POS
Antibody Screen: NEGATIVE
UNIT DIVISION: 0
UNIT DIVISION: 0
UNIT DIVISION: 0
Unit division: 0

## 2015-05-08 LAB — CBC
HCT: 28.8 % — ABNORMAL LOW (ref 35.0–47.0)
HEMOGLOBIN: 9 g/dL — AB (ref 12.0–16.0)
MCH: 23.1 pg — AB (ref 26.0–34.0)
MCHC: 31.4 g/dL — ABNORMAL LOW (ref 32.0–36.0)
MCV: 73.7 fL — AB (ref 80.0–100.0)
PLATELETS: 162 10*3/uL (ref 150–440)
RBC: 3.91 MIL/uL (ref 3.80–5.20)
RDW: 18 % — ABNORMAL HIGH (ref 11.5–14.5)
WBC: 6.5 10*3/uL (ref 3.6–11.0)

## 2015-05-08 LAB — BASIC METABOLIC PANEL
Anion gap: 8 (ref 5–15)
CALCIUM: 8.1 mg/dL — AB (ref 8.9–10.3)
CO2: 24 mmol/L (ref 22–32)
Chloride: 109 mmol/L (ref 101–111)
Creatinine, Ser: 0.59 mg/dL (ref 0.44–1.00)
GFR calc Af Amer: >60 mL/min (ref >60)
GFR calc non Af Amer: >60 mL/min (ref >60)
GLUCOSE: 87 mg/dL (ref 65–99)
Potassium: 2.9 mmol/L — CL (ref 3.5–5.1)
SODIUM: 141 mmol/L (ref 135–145)

## 2015-05-08 LAB — GLUCOSE, CAPILLARY
GLUCOSE-CAPILLARY: 75 mg/dL (ref 70–99)
Glucose-Capillary: 89 mg/dL (ref 70–99)
Glucose-Capillary: 95 mg/dL (ref 70–99)

## 2015-05-08 LAB — MAGNESIUM: MAGNESIUM: 1.6 mg/dL — AB (ref 1.7–2.4)

## 2015-05-08 LAB — POTASSIUM: POTASSIUM: 3.6 mmol/L (ref 3.5–5.1)

## 2015-05-08 SURGERY — COLONOSCOPY
Anesthesia: General

## 2015-05-08 MED ORDER — MIDAZOLAM HCL 2 MG/2ML IJ SOLN
INTRAMUSCULAR | Status: DC | PRN
  Administered 2015-05-08: 1 mg via INTRAVENOUS

## 2015-05-08 MED ORDER — POTASSIUM CHLORIDE 20 MEQ PO PACK
40.0000 meq | PACK | Freq: Once | ORAL | Status: AC
  Administered 2015-05-08: 40 meq via ORAL
  Filled 2015-05-08: qty 2

## 2015-05-08 MED ORDER — POTASSIUM CHLORIDE 20 MEQ PO PACK: 40.0000 meq | PACK | ORAL | Status: DC

## 2015-05-08 MED ORDER — POTASSIUM CHLORIDE CRYS ER 20 MEQ PO TBCR
40.0000 meq | EXTENDED_RELEASE_TABLET | Freq: Two times a day (BID) | ORAL | Status: DC
  Administered 2015-05-08: 07:00:00 40 meq via ORAL
  Filled 2015-05-08 (×3): qty 2

## 2015-05-08 MED ORDER — POTASSIUM CHLORIDE 20 MEQ PO PACK: 40.0000 meq | PACK | Freq: Once | ORAL | Status: DC

## 2015-05-08 MED ORDER — FERROUS SULFATE 325 (65 FE) MG PO TABS
325.0000 mg | ORAL_TABLET | Freq: Every day | ORAL | Status: DC
Start: 2015-05-08 — End: 2015-12-22

## 2015-05-08 MED ORDER — PROPOFOL INFUSION 10 MG/ML OPTIME
INTRAVENOUS | Status: DC | PRN
  Administered 2015-05-08: 140 ug/kg/min via INTRAVENOUS

## 2015-05-08 MED ORDER — PROPOFOL 10 MG/ML IV BOLUS
INTRAVENOUS | Status: DC | PRN
  Administered 2015-05-08: 30 mg via INTRAVENOUS

## 2015-05-08 MED ORDER — FENTANYL CITRATE (PF) 100 MCG/2ML IJ SOLN
INTRAMUSCULAR | Status: DC | PRN
  Administered 2015-05-08 (×2): 25 ug via INTRAVENOUS
  Administered 2015-05-08: 50 ug via INTRAVENOUS

## 2015-05-08 MED ORDER — MAGNESIUM OXIDE 400 (241.3 MG) MG PO TABS: 400.0000 mg | ORAL_TABLET | Freq: Every day | ORAL | Status: DC

## 2015-05-08 MED ORDER — PHENYLEPHRINE HCL 10 MG/ML IJ SOLN
INTRAMUSCULAR | Status: DC | PRN
  Administered 2015-05-08 (×2): 100 ug via INTRAVENOUS
  Administered 2015-05-08: 200 ug via INTRAVENOUS

## 2015-05-08 MED ORDER — AMIODARONE HCL 200 MG PO TABS: 200.0000 mg | ORAL_TABLET | Freq: Two times a day (BID) | ORAL | Status: DC

## 2015-05-08 NOTE — Progress Notes (Signed)
MD notified of potassium level of 2.9 this am

## 2015-05-08 NOTE — Transfer of Care (Signed)
Immediate Anesthesia Transfer of Care Note  Patient: Brandy Armstrong  Procedure(s) Performed: Procedure(s): COLONOSCOPY (N/A)  Patient Location: PACU  Anesthesia Type:General  Level of Consciousness: awake and alert   Airway & Oxygen Therapy: Patient Spontanous Breathing and Patient connected to nasal cannula oxygen  Post-op Assessment: Report given to RN and Post -op Vital signs reviewed and stable  Post vital signs: Reviewed and stable  Last Vitals:  Filed Vitals:   05/08/15 1407  BP: 125/69  Pulse: 89  Temp: 36.8 C  Resp: 14    Complications: No apparent anesthesia complications

## 2015-05-08 NOTE — Progress Notes (Signed)
Pt discharged home per MD order. IV removed. Discharge instructions given to patient. Prescriptions given to patient. Patient verbalized understanding. Pt left via wheelchair with nursing and family.

## 2015-05-08 NOTE — Anesthesia Postprocedure Evaluation (Signed)
  Anesthesia Post-op Note  Patient: Brandy Armstrong  Procedure(s) Performed: Procedure(s): COLONOSCOPY (N/A)  Anesthesia type:General  Patient location: PACU  Post pain: Pain level controlled  Post assessment: Post-op Vital signs reviewed, Patient's Cardiovascular Status Stable, Respiratory Function Stable, Patent Airway and No signs of Nausea or vomiting  Post vital signs: Reviewed and stable  Last Vitals:  Filed Vitals:   05/08/15 1530  BP: 130/78  Pulse: 79  Temp:   Resp: 20    Level of consciousness: awake, alert  and patient cooperative  Complications: No apparent anesthesia complications

## 2015-05-08 NOTE — Plan of Care (Deleted)
Problem: Discharge Progression Outcomes Goal: Barriers To Progression Addressed/Resolved Outcome: Progressing Erline Levine, RN Registered Nurse Signed Nursing Plan of Care 05/06/2015  5:13 AM     Expand All Collapse All   Problem: Discharge Progression Outcomes Goal: Barriers To Progression Addressed/Resolved Outcome: Progressing Individualization of Care:   PMH: atrial fibrillation, COPD, rheumatoid arthritis, neuropathy, GERD - controlled by home medications.        Goal: Other Discharge Outcomes/Goals Outcome: Progressing   No active signs of bleeding. Protonix drip infusing at 25 ml/hr. Assistance provided to bathroom. Patient c/o headache -Tylenol given with relief. EGD scheduled for this am., npo after midnight                           Comments:  PMH: atrial fibrillation, COPD, rheumatoid arthritis, neuropathy, GERD - controlled by home medications.

## 2015-05-08 NOTE — Plan of Care (Signed)
Problem: Discharge Progression Outcomes Goal: Discharge plan in place and appropriate Outcome: Progressing PMH: atrial fibrillation, COPD, rheumatoid arthritis, neuropathy, GERD - controlled by home medications Goal: Other Discharge Outcomes/Goals Outcome: Progressing Plan of Care Progress to Goal:  Pt with no complaints of pain or discomfort. Pt tolerating go-lytely. Colonoscopy to be done 05-08-15.

## 2015-05-08 NOTE — Anesthesia Preprocedure Evaluation (Addendum)
Anesthesia Evaluation  Patient identified by MRN, date of birth, ID band Patient awake    Reviewed: Allergy & Precautions, NPO status , Patient's Chart, lab work & pertinent test results, reviewed documented beta blocker date and time   Airway Mallampati: III  TM Distance: >3 FB Neck ROM: Full    Dental  (+) Upper Dentures, Lower Dentures   Pulmonary shortness of breath and with exertion, COPD COPD inhaler, former smoker,  breath sounds clear to auscultation        Cardiovascular + dysrhythmias Atrial Fibrillation Rhythm:Regular Rate:Normal  Hx of Afib...ablation in 2012   Neuro/Psych PSYCHIATRIC DISORDERS Anxiety  Neuromuscular disease    GI/Hepatic Neg liver ROS, GERD-  Controlled,Hx of barretts esophagus... + colon polyps   Endo/Other    Renal/GU Urinary incontinence  negative genitourinary   Musculoskeletal  (+) Arthritis -, Osteoarthritis, Rheumatoid disorders and on steriods ,  Ankle and neck pain   Abdominal Normal abdominal exam  (+)   Peds negative pediatric ROS (+)  Hematology   Anesthesia Other Findings   Reproductive/Obstetrics                            Anesthesia Physical Anesthesia Plan  ASA: III  Anesthesia Plan: General   Post-op Pain Management:    Induction: Intravenous  Airway Management Planned: Nasal Cannula  Additional Equipment:   Intra-op Plan:   Post-operative Plan:   Informed Consent: I have reviewed the patients History and Physical, chart, labs and discussed the procedure including the risks, benefits and alternatives for the proposed anesthesia with the patient or authorized representative who has indicated his/her understanding and acceptance.   Dental advisory given  Plan Discussed with: CRNA and Surgeon  Anesthesia Plan Comments:         Anesthesia Quick Evaluation

## 2015-05-08 NOTE — Discharge Instructions (Signed)
Stop takin xarelto until cleared by Gi LOW residue diet please  pcp to f/u on urine culture reslts

## 2015-05-08 NOTE — Op Note (Signed)
[facility] Gastroenterology Patient Name: Brandy Armstrong Procedure Date: 05/08/2015 2:03 PM MRN: [id number] Account #: [id number] Date of Birth: 1946/12/15 Admit Type: Inpatient Age: 69 Room: [facility] ENDO ROOM 1 Gender: Female Note Status: Finalized Procedure:         Colonoscopy Indications:       Heme positive stool Providers:         Manya Silvas, MD Medicines:         Propofol per Anesthesia Complications:     No immediate complications. Procedure:         Pre-Anesthesia Assessment:                    - After reviewing the risks and benefits, the patient was                     deemed in satisfactory condition to undergo the procedure.                    After obtaining informed consent, the colonoscope was                     passed under direct vision. Throughout the procedure, the                     patient's blood pressure, pulse, and oxygen saturations                     were monitored continuously. The Olympus PCF-H180AL                     colonoscope ( S#: [id number] ) was introduced through the                     anus and advanced to the the cecum, identified by                     appendiceal orifice and ileocecal valve. The colonoscopy                     was performed without difficulty. The patient tolerated                     the procedure well. The quality of the bowel preparation                     was good. Findings:      A diminutive polyp was found in the cecum. The polyp was sessile. The       polyp was removed with a jumbo cold forceps. Resection and retrieval       were complete.      A small polyp was found in the sigmoid colon. The polyp was sessile. The       polyp was removed with a hot snare. Resection and retrieval were       complete.      A diminutive polyp was found in the sigmoid colon. The polyp was       sessile. The polyp was removed with a cold snare. Resection and       retrieval were complete.      Multiple small and  large-mouthed diverticula were found in the sigmoid       colon, in the descending colon and in the transverse colon.      Internal hemorrhoids were found  during endoscopy. The hemorrhoids were       small and Grade I (internal hemorrhoids that do not prolapse).      The exam was otherwise without abnormality. Impression:        - One diminutive polyp in the cecum. Resected and                     retrieved.                    - One small polyp in the sigmoid colon. Resected and                     retrieved.                    - One diminutive polyp in the sigmoid colon. Resected and                     retrieved.                    - Diverticulosis in the sigmoid colon, in the descending                     colon and in the transverse colon.                    - Internal hemorrhoids.                    - The examination was otherwise normal. Recommendation:    - Await pathology results. Manya Silvas, MD 05/08/2015 2:53:35 PM This report has been signed electronically. Number of Addenda: 0 Note Initiated On: 05/08/2015 2:03 PM Scope Withdrawal Time: 0 hours 12 minutes 46 seconds       [facility]

## 2015-05-09 ENCOUNTER — Telehealth: Payer: Self-pay | Admitting: *Deleted

## 2015-05-09 LAB — URINE CULTURE

## 2015-05-09 NOTE — Telephone Encounter (Signed)
Left message for pt to return my call.

## 2015-05-09 NOTE — Telephone Encounter (Signed)
Carrie-please disregard note, sent in error

## 2015-05-09 NOTE — Telephone Encounter (Signed)
TCM call completed. FYI for Friday's appt: Any patient concerns? Pt has degenerative disc disease, used to take Advil for pain, no longer able to take. Dr. Gustavo Lah had recommended Tramadol, pt has taken in past. States she is ok for now, but would like to discuss with Morey Hummingbird at appt. Advised I would send her a message letting her know.

## 2015-05-09 NOTE — Telephone Encounter (Addendum)
Discharge Date: 05/08/15  Transition Care Management Follow-up Telephone Call  How have you been since you were released from the hospital? Pt states doing ok, still a little weak and tired, but improving. Denies any GI bleeding since discharge.    Do you understand why you were in the hospital? YES, GI bleed   Do you understand the discharge instructions? YES. No Xarelto until cleared by GI.   Items Reviewed:  Medications reviewed: YES  Allergies reviewed: YES, no new drug allergies  Dietary changes reviewed: YES. Low sodium heart healthy, low residue  Referrals reviewed: n/a   Functional Questionnaire:   Activities of Daily Living (ADLs):  She states they are independent in the following: ALL States they require assistance with the following: None   Any transportation issues/concerns?: NO, drives self   Any patient concerns? Pt has degenerative disc disease, used to take Advil for pain, no longer able to take. Dr. Gustavo Lah had recommended Tramadol, pt has taken in past. States she is ok for now, but would like to discuss with Morey Hummingbird at appt. Advised I would send her a message letting her know.    Confirmed importance and date/time of follow-up visits scheduled: YES. Follow up with Dr. Vira Agar in 2 weeks, pt has call in, waiting for call back with appt. Appt scheduled with [facility] 05/12/15 @ 3:00.    Confirmed with patient if condition begins to worsen call PCP or go to the ER. Patient was given the Call-a-Nurse line [phone]: YES

## 2015-05-10 ENCOUNTER — Encounter: Payer: Self-pay | Admitting: Gastroenterology

## 2015-05-10 LAB — SURGICAL PATHOLOGY

## 2015-05-12 ENCOUNTER — Ambulatory Visit: Payer: Medicare Other | Admitting: Nurse Practitioner

## 2015-05-13 DIAGNOSIS — N644 Mastodynia: Secondary | ICD-10-CM | POA: Insufficient documentation

## 2015-05-13 NOTE — Assessment & Plan Note (Signed)
Breast tenderness probably due to bra rubbing the sites. No signs of possible abscess. FU in 2 weeks

## 2015-05-15 ENCOUNTER — Ambulatory Visit (INDEPENDENT_AMBULATORY_CARE_PROVIDER_SITE_OTHER): Payer: Medicare Other | Admitting: Nurse Practitioner

## 2015-05-15 ENCOUNTER — Encounter: Payer: Self-pay | Admitting: Nurse Practitioner

## 2015-05-15 VITALS — BP 118/76 | HR 71 | Temp 98.0°F | Resp 14 | Ht 66.25 in | Wt 201.8 lb

## 2015-05-15 DIAGNOSIS — Z09 Encounter for follow-up examination after completed treatment for conditions other than malignant neoplasm: Secondary | ICD-10-CM | POA: Diagnosis not present

## 2015-05-15 DIAGNOSIS — E876 Hypokalemia: Secondary | ICD-10-CM

## 2015-05-15 DIAGNOSIS — D62 Acute posthemorrhagic anemia: Secondary | ICD-10-CM

## 2015-05-15 LAB — CBC WITH DIFFERENTIAL/PLATELET
Basophils Absolute: 0 10*3/uL (ref 0.0–0.1)
Basophils Relative: 0.7 % (ref 0.0–3.0)
Eosinophils Absolute: 0 10*3/uL (ref 0.0–0.7)
Eosinophils Relative: 0.8 % (ref 0.0–5.0)
HCT: 29.1 % — ABNORMAL LOW (ref 36.0–46.0)
Hemoglobin: 9.2 g/dL — ABNORMAL LOW (ref 12.0–15.0)
Lymphocytes Relative: 23 % (ref 12.0–46.0)
Lymphs Abs: 1.3 10*3/uL (ref 0.7–4.0)
MCHC: 31.7 g/dL (ref 30.0–36.0)
MCV: 72.6 fl — ABNORMAL LOW (ref 78.0–100.0)
Monocytes Absolute: 0.5 10*3/uL (ref 0.1–1.0)
Monocytes Relative: 9.4 % (ref 3.0–12.0)
Neutro Abs: 3.7 10*3/uL (ref 1.4–7.7)
Neutrophils Relative %: 66.1 % (ref 43.0–77.0)
Platelets: 192 10*3/uL (ref 150.0–400.0)
RBC: 4.02 Mil/uL (ref 3.87–5.11)
RDW: 19.2 % — ABNORMAL HIGH (ref 11.5–15.5)
WBC: 5.6 10*3/uL (ref 4.0–10.5)

## 2015-05-15 LAB — POTASSIUM: POTASSIUM: 4.1 meq/L (ref 3.5–5.1)

## 2015-05-15 MED ORDER — TRAMADOL HCL 50 MG PO TABS: 50.0000 mg | ORAL_TABLET | Freq: Two times a day (BID) | ORAL | Status: DC

## 2015-05-15 NOTE — Progress Notes (Signed)
Pre visit review using our clinic review tool, if applicable. No additional management support is needed unless otherwise documented below in the visit note. 

## 2015-05-15 NOTE — Progress Notes (Signed)
Subjective:    Patient ID: Brandy Armstrong, female    DOB: March 10, 1946, 69 y.o.   MRN: [id number]  HPI  Brandy Armstrong is a 69 yo female here for her TCM visit.    1)  D/c date from [facility] was 05/08/15 Patient denies further anal/rectal bleeding or pain, reports back pain. Seeing Dr. Tiffany Kocher on Wednesday.   Back pain- lower back bilateral and midline, standing worse, lying fine, worst 10/10, Best 0/10. Denies radiation.   Stop Xarelto and NSAID's, low residue diet recommended  Capsule Endoscopy soon with Dr. Vira Agar Transfused 1 unit in hospital   Urine culture- 20,000 colonies of K. Pneumonia  Pt was treated with doxycyline recently. Feels improved.  Review of Systems  Constitutional: Negative for fever, chills, diaphoresis and fatigue.  Eyes: Negative for visual disturbance.  Respiratory: Negative for chest tightness, shortness of breath and wheezing.   Cardiovascular: Negative for chest pain, palpitations and leg swelling.  Gastrointestinal: Negative for nausea, vomiting, diarrhea, constipation and blood in stool.  Musculoskeletal: Positive for back pain.  Skin: Negative for rash.  Neurological: Negative for dizziness, weakness, numbness and headaches.       Denies losing bowel/bladder function, saddle anesthesia, or weakness of extremities.   Psychiatric/Behavioral: The patient is not nervous/anxious.    Past Medical History  Diagnosis Date  . Rheumatoid arthritis     On methotrexate and orencia  . GERD (gastroesophageal reflux disease)   . Atrial fibrillation     a. s/p ablation 01/2012 in [facility] by Dr. Boyd Kerbs;  b. On sotalol & Xarelto;  c. 02/2014 Echo: EF 50-55%, mild conc LVH, nl LA size/structure;  d. Recurrent afib 8/15 & 08/29/2014.  [name] Urinary incontinence   . Colon polyps   . Depression with anxiety   . Barretts esophagus   . Rectal fistula   . Vitamin D deficiency   . Chest pain     a. 02/2014 Myoview: Ef 50%, no ischemia.  [name] COPD (chronic obstructive pulmonary  disease)   . COPD (chronic obstructive pulmonary disease)     History   Social History  . Marital Status: Single    Spouse Name: N/A  . Number of Children: 0  . Years of Education: 16   Occupational History  . Not on file.   Social History Main Topics  . Smoking status: Former Smoker -- 1.00 packs/day for 40 years    Quit date: 12/16/2011  . Smokeless tobacco: Never Used  . Alcohol Use: No     Comment: Occasionally has a drink  . Drug Use: No  . Sexual Activity: Yes   Other Topics Concern  . Not on file   Social History Narrative   Brandy Armstrong was born and reared in [place]. She attended [employer] and graduated in 1971 with her [employer] in Education. 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 She then worked for a [employer] in [place]. She is single. She is currently retired. She loves reading. She loves to spend time with her dog.     Past Surgical History  Procedure Laterality Date  . Cholecystectomy  1987  . Abdominal hysterectomy  1992  . Oophrectomy Bilateral 1992  . Cardiac electrophysiology study and ablation  2013  . Esophagogastroduodenoscopy N/A 05/06/2015    Procedure: ESOPHAGOGASTRODUODENOSCOPY (EGD);  Surgeon: Lollie Sails, MD;  Location: [facility] ENDOSCOPY;  Service: Endoscopy;  Laterality: N/A;  . Colonoscopy N/A 05/08/2015  Procedure: COLONOSCOPY;  Surgeon: Manya Silvas, MD;  Location: [facility] ENDOSCOPY;  Service: Endoscopy;  Laterality: N/A;    Family History  Problem Relation Age of Onset  . Depression Mother   . Cancer Mother 46    pancreatic cancer  . Cancer Father 79    colon cancer  . Cancer Sister 30    breast cancer  . Diabetes Brother     Allergies  Allergen Reactions  . Codeine Nausea Only  . Latex     Current Outpatient Prescriptions on File Prior to Visit  Medication Sig Dispense Refill  . amiodarone (PACERONE) 200 MG tablet Take 1 tablet (200 mg total) by mouth  2 (two) times daily. 60 tablet 1  . budesonide-formoterol (SYMBICORT) 160-4.5 MCG/ACT inhaler Inhale 2 puffs into the lungs 2 (two) times daily.    . cholestyramine (QUESTRAN) 4 G packet Take 1 packet by mouth as needed.    . clonazePAM (KLONOPIN) 0.5 MG tablet Take 1 tablet (0.5 mg total) by mouth 3 (three) times daily as needed. 90 tablet 1  . escitalopram (LEXAPRO) 20 MG tablet Take 1 tablet (20 mg total) by mouth daily. 30 tablet 2  . ferrous sulfate 325 (65 FE) MG tablet Take 1 tablet (325 mg total) by mouth daily with breakfast. 1 tablet 3  . gabapentin (NEURONTIN) 300 MG capsule Take 2 capsules (600 mg total) by mouth at bedtime. 60 capsule 1  . hydroxychloroquine (PLAQUENIL) 200 MG tablet Take 200 mg by mouth 2 (two) times daily.    [name] leflunomide (ARAVA) 20 MG tablet Take 20 mg by mouth daily.    [name] levalbuterol (XOPENEX HFA) 45 MCG/ACT inhaler Inhale 2 puffs into the lungs every 4 (four) hours as needed for wheezing.    . magnesium oxide (MAG-OX) 400 (241.3 MG) MG tablet Take 1 tablet (400 mg total) by mouth daily. 10 tablet 0  . metoprolol succinate (TOPROL-XL) 25 MG 24 hr tablet   6  . predniSONE (DELTASONE) 5 MG tablet Take 5 mg by mouth as needed.    . tiotropium (SPIRIVA) 18 MCG inhalation capsule Place 18 mcg into inhaler and inhale daily.     No current facility-administered medications on file prior to visit.       Objective:   Physical Exam  Constitutional: She is oriented to person, place, and time. She appears well-developed and well-nourished. No distress.  BP 118/76 mmHg  Pulse 71  Temp(Src) 98 F (36.7 C) (Oral)  Resp 14  Ht 5' 6.25" (1.683 m)  Wt 201 lb 12.8 oz (91.536 kg)  BMI 32.32 kg/m2  SpO2 95%   HENT:  Head: Normocephalic and atraumatic.  Right Ear: External ear normal.  Left Ear: External ear normal.  Cardiovascular: Normal rate, regular rhythm and normal heart sounds.  Exam reveals no gallop and no friction rub.   No murmur heard. Pulmonary/Chest:  Effort normal and breath sounds normal. No respiratory distress. She has no wheezes. She has no rales. She exhibits no tenderness.  Neurological: She is alert and oriented to person, place, and time. No cranial nerve deficit. She exhibits normal muscle tone. Coordination normal.  Iliopsoas 5/5 Bilateral, Tib anterior 5/5 bilateral, EHL 5/5 bilateral, no ankle clonus, intact heel/toe/sequential walking, sensation intact upper and lower extremities. Straight leg raise negative bilaterally.   Skin: Skin is warm and dry. No rash noted. She is not diaphoretic.  Psychiatric: She has a normal mood and affect. Her behavior is normal. Judgment and thought content normal.  Assessment & Plan:

## 2015-05-15 NOTE — Discharge Summary (Signed)
[facility] [facility] [place] NAME: Brandy Armstrong    MR#:  [id number]  DATE OF BIRTH:  19-Nov-1946  DATE OF ADMISSION:  05/04/2015 ADMITTING PHYSICIAN: Loletha Grayer, MD  DATE OF DISCHARGE: 05/08/2015  6:16 PM  PRIMARY CARE PHYSICIAN: Rubbie Battiest, NP    ADMISSION DIAGNOSIS:  Acute gastrointestinal bleeding [K92.2]  DISCHARGE DIAGNOSIS:  Active Problems:   GI bleed due to NSAIDs   SECONDARY DIAGNOSIS:   Past Medical History  Diagnosis Date  . Rheumatoid arthritis     On methotrexate and orencia  . GERD (gastroesophageal reflux disease)   . Atrial fibrillation     a. s/p ablation 01/2012 in [facility] by Dr. Boyd Kerbs;  b. On sotalol & Xarelto;  c. 02/2014 Echo: EF 50-55%, mild conc LVH, nl LA size/structure;  d. Recurrent afib 8/15 & 08/29/2014.  [name] Urinary incontinence   . Colon polyps   . Depression with anxiety   . Barretts esophagus   . Rectal fistula   . Vitamin D deficiency   . Chest pain     a. 02/2014 Myoview: Ef 50%, no ischemia.  [name] COPD (chronic obstructive pulmonary disease)   . COPD (chronic obstructive pulmonary disease)     HOSPITAL COURSE:  Brief History and physical: Please review history and physical for details. Brandy Armstrong is a 69 y.o. female came to ED  With a complaint that she's had blood in the stool has been black last few days, night before admission had a dark red blood and she had 1 bowel movement that morning before coming to ED that was black. No nausea or vomiting. Her abdomen is bloated. She states that she also bruises very easily. Of note she does take 3 Advil pills per day secondary to joint pains ,On the day of admission she took prednisone secondary to her wrist being inflamed. Last week she was also on doxycycline for UTI. In the ER, she is hemodynamically stable and her hemoglobin was found to be 9.0. Looking back at old labs, she does have a history of anemia and a recent hemoglobin was in the 10  range.  Hospital course:  #1 acute gastrointestinal bleed secondary to NSAIDs. EGD on May 7 has revealed acute gastritis probably NSAID induced. No active bleeding, colonoscopy on 5/9, no active bleeding.  GI recommended  off Xarelto and Advil until f/u with GI. GI has recommended to continue her on low residue  diet , continue proton pump inhibitor #2 Acute blood loss anemia. Monitored hemoglobin and hematocrit every 12 , to transfuse blood as needed basis-plan is to continue Protonix    #3  Chronic atrial fibrillation , now in normal sinus rhythm. -Risk of stroke is higher as she is off Xarelto 2/2  # 1  -Rate controlled, continue on amiodarone.  #4 rheumatoid arthritis with joint flare right wrist. -We'll continue usual medications for rheumatoid arthritis, continue home med prednisone  daily.  #5 COPD - Continue Symbicort and Spiriva. Stable  #6 neuropathy- On gabapentin.  #7 anxiety and depression, stable .Continue psychiatric medications  #8 hypokalemia-  provided supplements with potassium prn  #9 urinary frequency- urinalysis done and urine culture pending at the time of discharge, PCP to f/u on urine culture and sensitivity results , not given antibiotics at discharge time Plan of care discussed in detail with the patient.    DISCHARGE CONDITIONS AND DIET:  Satisfactory  Diet - aha  CONSULTS OBTAINED:  Treatment Team:  Manya Silvas, MD Lollie Sails, MD   EGD -5/7 Colonoscopy- 5/9         DRUG ALLERGIES:   Allergies  Allergen Reactions  . Codeine Nausea Only  . Latex     DISCHARGE MEDICATIONS:   Discharge Medication List as of 05/08/2015  5:55 PM    START taking these medications   Details  ferrous sulfate 325 (65 FE) MG tablet Take 1 tablet (325 mg total) by mouth daily with breakfast., Starting 05/08/2015, Until Discontinued, Print    magnesium oxide (MAG-OX) 400 (241.3 MG) MG tablet Take 1 tablet (400 mg total) by mouth daily.,  Starting 05/08/2015, Until Discontinued, Print      CONTINUE these medications which have CHANGED   Details  amiodarone (PACERONE) 200 MG tablet Take 1 tablet (200 mg total) by mouth 2 (two) times daily., Starting 05/08/2015, Until Discontinued, Print      CONTINUE these medications which have NOT CHANGED   Details  budesonide-formoterol (SYMBICORT) 160-4.5 MCG/ACT inhaler Inhale 2 puffs into the lungs 2 (two) times daily., Until Discontinued, Historical Med    cholestyramine (QUESTRAN) 4 G packet Take 1 packet by mouth as needed., Until Discontinued, Historical Med    clonazePAM (KLONOPIN) 0.5 MG tablet Take 1 tablet (0.5 mg total) by mouth 3 (three) times daily as needed., Starting 04/28/2015, Until Discontinued, Print    escitalopram (LEXAPRO) 20 MG tablet Take 1 tablet (20 mg total) by mouth daily., Starting 03/13/2015, Until Discontinued, Normal    gabapentin (NEURONTIN) 300 MG capsule Take 2 capsules (600 mg total) by mouth at bedtime., Starting 04/28/2015, Until Discontinued, Normal    hydroxychloroquine (PLAQUENIL) 200 MG tablet Take 200 mg by mouth 2 (two) times daily., Starting 10/17/2014, Until Discontinued, Historical Med    leflunomide (ARAVA) 20 MG tablet Take 20 mg by mouth daily., Starting 01/09/2015, Until Discontinued, Historical Med    levalbuterol (XOPENEX HFA) 45 MCG/ACT inhaler Inhale 2 puffs into the lungs every 4 (four) hours as needed for wheezing., Until Discontinued, Historical Med    metoprolol succinate (TOPROL-XL) 25 MG 24 hr tablet Starting 04/06/2015, Until Discontinued, Historical Med    predniSONE (DELTASONE) 5 MG tablet Take 5 mg by mouth as needed., Until Discontinued, Historical Med    tiotropium (SPIRIVA) 18 MCG inhalation capsule Place 18 mcg into inhaler and inhale daily., Until Discontinued, Historical Med      STOP taking these medications     magnesium 30 MG tablet      Rivaroxaban (XARELTO) 20 MG TABS tablet               Today    CHIEF COMPLAINT:  Feeling fine   VITAL SIGNS:  Blood pressure 150/72, pulse 84, temperature 97.9 F (36.6 C), temperature source Oral, resp. rate 20, height 5\' 6"  (1.676 m), weight 89.812 kg (198 lb), SpO2 95 %.   REVIEW OF SYSTEMS:  Review of Systems  Constitutional: Negative for fever and weight loss.  HENT: Negative for ear discharge, ear pain and sore throat.   Eyes: Negative for blurred vision and pain.  Respiratory: Negative for cough and hemoptysis.   Cardiovascular: Negative for chest pain and palpitations.  Gastrointestinal: Negative for heartburn, vomiting and diarrhea.  Genitourinary: Negative for dysuria and urgency.  Musculoskeletal: Negative for myalgias and back pain.  Skin: Negative for itching and rash.  Neurological: Negative for dizziness, tremors, speech change, weakness and headaches.  Endo/Heme/Allergies: Negative for environmental allergies. Does not bruise/bleed easily.  Psychiatric/Behavioral: Negative for suicidal ideas  and hallucinations.     PHYSICAL EXAMINATION:  GENERAL:  69 y.o.-year-old patient lying in the bed with no acute distress.  NECK:  Supple, no jugular venous distention. No thyroid enlargement, no tenderness.  LUNGS: Normal breath sounds bilaterally, no wheezing, rales,rhonchi  No use of accessory muscles of respiration.  CARDIOVASCULAR: S1, S2 normal. No murmurs, rubs, or gallops.  ABDOMEN: Soft, non-tender, non-distended. Bowel sounds present. No organomegaly or mass.  EXTREMITIES: No pedal edema, cyanosis, or clubbing.  PSYCHIATRIC: The patient is alert and oriented x 3.  SKIN: No obvious rash, lesion, or ulcer.   DATA REVIEW:   CBC  Recent Labs Lab 05/15/15 1117  WBC 5.6  HGB 9.2*  HCT 29.1*  PLT 192.0    Chemistries   Recent Labs Lab 05/15/15 1117  K 4.1    Cardiac Enzymes No results for input(s): TROPONINI in the last 168 hours.  Microbiology Results  Results for orders placed or performed during the  hospital encounter of 05/04/15  Urine culture     Status: None   Collection Time: 05/07/15  6:35 PM  Result Value Ref Range Status   Specimen Description Urine  Final   Special Requests NONE  Final   Culture 20,000 COLONIES/ml KLEBSIELLA PNEUMONIAE  Final   Report Status 05/09/2015 FINAL  Final   Organism ID, Bacteria KLEBSIELLA PNEUMONIAE  Final      Susceptibility   Klebsiella pneumoniae - MIC*    AMPICILLIN 16 RESISTANT Resistant     CEFTAZIDIME <=1 SENSITIVE Sensitive     CEFAZOLIN <=4 SENSITIVE Sensitive     CEFTRIAXONE <=1 SENSITIVE Sensitive     CIPROFLOXACIN <=0.25 SENSITIVE Sensitive     GENTAMICIN <=1 SENSITIVE Sensitive     IMIPENEM <=0.25 SENSITIVE Sensitive     TRIMETH/SULFA <=20 SENSITIVE Sensitive     CEFOXITIN <=4 SENSITIVE Sensitive     * 20,000 COLONIES/ml KLEBSIELLA PNEUMONIAE    RADIOLOGY:   None   Management plans discussed with the patient and she is in agreement. Stable for discharge   CODE STATUS:  Advance Directive Documentation        Most Recent Value   Type of Advance Directive  Healthcare Power of Attorney   Pre-existing out of facility DNR order (yellow form or pink MOST form)     "MOST" Form in Place?       TOTAL TIME TAKING CARE OF THIS PATIENT: 45 minutes.   Nicholes Mango M.D on 05/15/2015 at 8:25 PM  Between 7am to 6pm - Pager - [phone] After 6pm go to [email or web address] - password [email or web address]  Office  [phone]  CC: Primary care physician; Rubbie Battiest, NP

## 2015-05-15 NOTE — Patient Instructions (Signed)
lLow-Fiber Diet Fiber is found in fruits, vegetables, and whole grains. A low-fiber diet restricts fibrous foods that are not digested in the small intestine. A diet containing about 10-15 grams of fiber per day is considered low fiber. Low-fiber diets may be used to:  Promote healing and rest the bowel during intestinal flare-ups.  Prevent blockage of a partially obstructed or narrowed gastrointestinal tract.  Reduce fecal weight and volume.  Slow the movement of feces. You may be on a low-fiber diet as a transitional diet following surgery, after an injury (trauma), or because of a short (acute) or lifelong (chronic) illness. Your health care provider will determine the length of time you need to stay on this diet.  WHAT DO I NEED TO KNOW ABOUT A LOW-FIBER DIET? Always check the fiber content on the packaging's Nutrition Facts label, especially on foods from the grains list. Ask your dietitian if you have questions about specific foods that are related to your condition, especially if the food is not listed below. In general, a low-fiber food will have less than 2 g of fiber. WHAT FOODS CAN I EAT? Grains All breads and crackers made with white flour. Sweet rolls, doughnuts, waffles, pancakes, [place] toast, bagels. Pretzels, Melba toast, zwieback. Well-cooked cereals, such as cornmeal, farina, or cream cereals. Dry cereals that do not contain whole grains, fruit, or nuts, such as refined corn, wheat, rice, and oat cereals. Potatoes prepared any way without skins, plain pastas and noodles, refined white rice. Use white flour for baking and making sauces. Use allowed list of grains for casseroles, dumplings, and puddings.  Vegetables Strained tomato and vegetable juices. Fresh lettuce, cucumber, spinach. Well-cooked (no skin or pulp) or canned vegetables, such as asparagus, bean sprouts, beets, carrots, green beans, mushrooms, potatoes, pumpkin, spinach, yellow squash, tomato sauce/puree, turnips,  yams, and zucchini. Keep servings limited to  cup.  Fruits All fruit juices except prune juice. Cooked or canned fruits without skin and seeds, such as applesauce, apricots, cherries, fruit cocktail, grapefruit, grapes, mandarin oranges, melons, peaches, pears, pineapple, and plums. Fresh fruits without skin, such as apricots, avocados, bananas, melons, pineapple, nectarines, and peaches. Keep servings limited to  cup or 1 piece.  Meat and Other Protein Sources Ground or well-cooked tender beef, ham, veal, lamb, pork, or poultry. Eggs, plain cheese. Fish, oysters, shrimp, lobster, and other seafood. Liver, organ meats. Smooth nut butters. Dairy All milk products and alternative dairy substitutes, such as soy, rice, almond, and coconut, not containing added whole nuts, seeds, or added fruit. Beverages Decaf coffee, fruit, and vegetable juices or smoothies (small amounts, with no pulp or skins, and with fruits from allowed list), sports drinks, herbal tea. Condiments Ketchup, mustard, vinegar, cream sauce, cheese sauce, cocoa powder. Spices in moderation, such as allspice, basil, bay leaves, celery powder or leaves, cinnamon, cumin powder, curry powder, ginger, mace, marjoram, onion or garlic powder, oregano, paprika, parsley flakes, ground pepper, rosemary, sage, savory, tarragon, thyme, and turmeric. Sweets and Desserts Plain cakes and cookies, pie made with allowed fruit, pudding, custard, cream pie. Gelatin, fruit, ice, sherbet, frozen ice pops. Ice cream, ice milk without nuts. Plain hard candy, honey, jelly, molasses, syrup, sugar, chocolate syrup, gumdrops, marshmallows. Limit overall sugar intake.  Fats and Oil Margarine, butter, cream, mayonnaise, salad oils, plain salad dressings made from allowed foods. Choose healthy fats such as olive oil, canola oil, and omega-3 fatty acids (such as found in salmon or tuna) when possible.  Other Bouillon, broth, or cream soups made  from allowed foods.  Any strained soup. Casseroles or mixed dishes made with allowed foods. The items listed above may not be a complete list of recommended foods or beverages. Contact your dietitian for more options.  WHAT FOODS ARE NOT RECOMMENDED? Grains All whole wheat and whole grain breads and crackers. Multigrains, rye, bran seeds, nuts, or coconut. Cereals containing whole grains, multigrains, bran, coconut, nuts, raisins. Cooked or dry oatmeal, steel-cut oats. Coarse wheat cereals, granola. Cereals advertised as high fiber. Potato skins. Whole grain pasta, wild or brown rice. Popcorn. Coconut flour. Bran, buckwheat, corn bread, multigrains, rye, wheat germ.  Vegetables Fresh, cooked or canned vegetables, such as artichokes, asparagus, beet greens, broccoli, Brussels sprouts, cabbage, celery, cauliflower, corn, eggplant, kale, legumes or beans, okra, peas, and tomatoes. Avoid large servings of any vegetables, especially raw vegetables.  Fruits Fresh fruits, such as apples with or without skin, berries, cherries, figs, grapes, grapefruit, guavas, kiwis, mangoes, oranges, papayas, pears, persimmons, pineapple, and pomegranate. Prune juice and juices with pulp, stewed or dried prunes. Dried fruits, dates, raisins. Fruit seeds or skins. Avoid large servings of all fresh fruits. Meats and Other Protein Sources Tough, fibrous meats with gristle. Chunky nut butter. Cheese made with seeds, nuts, or other foods not recommended. Nuts, seeds, legumes (beans, including baked beans), dried peas, beans, lentils.  Dairy Yogurt or cheese that contains nuts, seeds, or added fruit.  Beverages Fruit juices with high pulp, prune juice. Caffeinated coffee and teas.  Condiments Coconut, maple syrup, pickles, olives. Sweets and Desserts Desserts, cookies, or candies that contain nuts or coconut, chunky peanut butter, dried fruits. Jams, preserves with seeds, marmalade. Large amounts of sugar and sweets. Any other dessert made with  fruits from the not recommended list.  Other Soups made from vegetables that are not recommended or that contain other foods not recommended.  The items listed above may not be a complete list of foods and beverages to avoid. Contact your dietitian for more information. Document Released: 06/07/2002 Document Revised: 12/21/2013 Document Reviewed: 11/08/2013 [facility] Patient Information 2015 [place], [place]. This information is not intended to replace advice given to you by your health care provider. Make sure you discuss any questions you have with your health care provider.

## 2015-05-17 DIAGNOSIS — K922 Gastrointestinal hemorrhage, unspecified: Secondary | ICD-10-CM | POA: Diagnosis not present

## 2015-05-22 ENCOUNTER — Ambulatory Visit (INDEPENDENT_AMBULATORY_CARE_PROVIDER_SITE_OTHER): Payer: Medicare Other | Admitting: Nurse Practitioner

## 2015-05-22 ENCOUNTER — Encounter: Payer: Self-pay | Admitting: Nurse Practitioner

## 2015-05-22 VITALS — BP 118/70 | HR 84 | Temp 98.4°F | Resp 16 | Ht 66.0 in | Wt 198.4 lb

## 2015-05-22 DIAGNOSIS — J069 Acute upper respiratory infection, unspecified: Secondary | ICD-10-CM | POA: Diagnosis not present

## 2015-05-22 MED ORDER — HYDROCOD POLST-CPM POLST ER 10-8 MG/5ML PO SUER: 5.0000 mL | Freq: Every evening | ORAL | Status: DC | PRN

## 2015-05-22 MED ORDER — AMOXICILLIN-POT CLAVULANATE 875-125 MG PO TABS: 1.0000 | ORAL_TABLET | Freq: Two times a day (BID) | ORAL | Status: DC

## 2015-05-22 NOTE — Progress Notes (Signed)
Pre visit review using our clinic review tool, if applicable. No additional management support is needed unless otherwise documented below in the visit note. 

## 2015-05-22 NOTE — Progress Notes (Signed)
   Subjective:    Patient ID: Brandy Armstrong, female    DOB: 1946-06-11, 69 y.o.   MRN: [id number]  HPI  Brandy Armstrong is a 69 yo female with a CC of cough with brownish phlegm x 4 days.   1) Reports fever yesterday, coughed so much her muscles are sore. Temperature of 99.9 yesterday. Brown thick sputum, recently in the hospital. Using her symbicort, spiriva, and xopenex when needed.   Delsym Mucinex Tylenol   Review of Systems  Constitutional: Positive for fatigue. Negative for fever, chills and diaphoresis.  HENT: Positive for rhinorrhea and sinus pressure. Negative for ear discharge, ear pain, sneezing and sore throat.   Respiratory: Positive for cough. Negative for chest tightness, shortness of breath and wheezing.   Cardiovascular: Negative for chest pain, palpitations and leg swelling.  Gastrointestinal: Negative for nausea, vomiting and diarrhea.  Neurological: Positive for headaches.      Objective:   Physical Exam  Constitutional: She is oriented to person, place, and time. She appears well-developed and well-nourished. No distress.  BP 118/70 mmHg  Pulse 84  Temp(Src) 98.4 F (36.9 C)  Resp 16  Ht 5\' 6"  (1.676 m)  Wt 198 lb 6.4 oz (89.994 kg)  BMI 32.04 kg/m2  SpO2 95%   HENT:  Head: Normocephalic and atraumatic.  Right Ear: External ear normal.  Left Ear: External ear normal.  Cardiovascular: Normal rate, regular rhythm, normal heart sounds and intact distal pulses.  Exam reveals no gallop and no friction rub.   No murmur heard. Pulmonary/Chest: Effort normal and breath sounds normal. No respiratory distress. She has no wheezes. She has no rales. She exhibits no tenderness.  Neurological: She is alert and oriented to person, place, and time. No cranial nerve deficit. She exhibits normal muscle tone. Coordination normal.  Skin: Skin is warm and dry. No rash noted. She is not diaphoretic.  Psychiatric: She has a normal mood and affect. Her behavior is normal. Judgment  and thought content normal.      Assessment & Plan:  Acute URI  1) Augmentin twice daily for 5 days.  2) Tussionex cough syrup 3) RTC if not improved in 1 week

## 2015-05-22 NOTE — Patient Instructions (Addendum)
Take the augmentin as prescribed (works better than a Z-pack with your medications).  Take the syrup 1 teaspoon at night.   Call us if not improved in a week.

## 2015-05-24 ENCOUNTER — Telehealth: Payer: Self-pay | Admitting: *Deleted

## 2015-05-24 DIAGNOSIS — D62 Acute posthemorrhagic anemia: Secondary | ICD-10-CM | POA: Insufficient documentation

## 2015-05-24 DIAGNOSIS — E876 Hypokalemia: Secondary | ICD-10-CM | POA: Insufficient documentation

## 2015-05-24 NOTE — Assessment & Plan Note (Signed)
Repeat potassium and CBC w/ diff today.

## 2015-05-24 NOTE — Telephone Encounter (Signed)
Pt called, stating she was seen 05/22/15, continues to have a cough. Insurance would not cover Tussionex, the pharmacist gave her Cheratussin instead. States she is not improving, still coughing. The cough med helps short term but then her symptoms come right back. Denies fever. Offered appt, states she does not want to come back in. Taking Augmentin as directed.

## 2015-05-24 NOTE — Assessment & Plan Note (Signed)
D/c from [facility] 05/08/15. Pt lost blood from GI. Transfused blood in hospital. Denies further bleeding or rectal pain. She, however, reports back pain. She is in close follow up with Dr. Vira Agar.

## 2015-05-24 NOTE — Assessment & Plan Note (Signed)
Transfused blood in hospital. Will repeat K+ and CBC w/ diff to look at trend. Denies further blood loss. Improving. Followed up closely with Dr. Vira Agar.

## 2015-05-25 ENCOUNTER — Other Ambulatory Visit: Payer: Self-pay | Admitting: Nurse Practitioner

## 2015-05-25 MED ORDER — BENZONATATE 100 MG PO CAPS: 100.0000 mg | ORAL_CAPSULE | Freq: Two times a day (BID) | ORAL | Status: DC | PRN

## 2015-05-25 NOTE — Telephone Encounter (Signed)
Please let Brandy Armstrong know that there is not much else to do. We can try tessalon perles and mucinex over the counter... I'm afraid to do prednisone because it could cause GI issues and she just got out of the hospital for those. Let me know! Thanks.

## 2015-05-25 NOTE — Telephone Encounter (Signed)
Spoke with patient and she is continuing to take the antibiotic along with OTC Mucinex DM.  Pt would like to try the tessalon perles.

## 2015-05-25 NOTE — Telephone Encounter (Signed)
Sent to pharmacy. Thanks

## 2015-05-30 ENCOUNTER — Telehealth: Payer: Self-pay | Admitting: Pulmonary Disease

## 2015-05-30 NOTE — Telephone Encounter (Signed)
Pt c/o cough x 3 weeks and chest tightness. Denies SOB.  Using Symbicort and Spiriva daily- not helping to control symptoms.  Albuterol helps some  Using OTC Delsym for cough, tessalon perles PRN Pt is requesting an OV to be evaluated.   Pt scheduled for OV with [facility] in [facility] 06/01/15 at 9:15. Nothing further needed.

## 2015-06-01 ENCOUNTER — Ambulatory Visit (INDEPENDENT_AMBULATORY_CARE_PROVIDER_SITE_OTHER): Payer: Medicare Other | Admitting: Pulmonary Disease

## 2015-06-01 ENCOUNTER — Ambulatory Visit
Admission: RE | Admit: 2015-06-01 | Discharge: 2015-06-01 | Disposition: A | Discharge status: Home / Self Care | Payer: Medicare Other | Source: Ambulatory Visit | Attending: Pulmonary Disease | Admitting: Pulmonary Disease

## 2015-06-01 ENCOUNTER — Encounter: Payer: Self-pay | Admitting: Pulmonary Disease

## 2015-06-01 VITALS — BP 128/68 | HR 72 | Ht 66.5 in | Wt 196.0 lb

## 2015-06-01 DIAGNOSIS — J438 Other emphysema: Secondary | ICD-10-CM | POA: Diagnosis not present

## 2015-06-01 DIAGNOSIS — J984 Other disorders of lung: Secondary | ICD-10-CM | POA: Diagnosis not present

## 2015-06-01 DIAGNOSIS — R0602 Shortness of breath: Secondary | ICD-10-CM | POA: Diagnosis not present

## 2015-06-01 DIAGNOSIS — R05 Cough: Secondary | ICD-10-CM | POA: Diagnosis not present

## 2015-06-01 MED ORDER — AMOXICILLIN-POT CLAVULANATE 875-125 MG PO TABS: 1.0000 | ORAL_TABLET | Freq: Two times a day (BID) | ORAL | Status: DC

## 2015-06-01 MED ORDER — FLUTTER DEVI: Status: DC

## 2015-06-01 NOTE — Progress Notes (Signed)
Subjective:    Patient ID: Brandy Armstrong, female    DOB: [birth year], 69 y.o.   MRN: [id number]  Synopsis: First seen by [facility] in 2015 for GOLD C COPD 03/24/2014 Simple Spirometry > Raito 66%, FEV1 1.12L (44% pred) 01/2014 CT angio chest reviewed> no PE, no scarring or fibrosis, motion artifact limited, but there is suggestion of mild centrilobular emphysema upper lobes bilaterally She previously smoked 1 pack a day for 40 plus years and quit    HPI  Chief Complaint  Patient presents with  . Acute Visit    Pt c/o prod cough with yellow/brown mucus, chest tightness X2 weeks- states this is better this morning.     She has had a "horrible cough" since May 20.  She was hospitalized for 5 days for GI bleedingand she said she thinks the symptoms started then.  She started with a visit to her PCP after the hospitalization and was treated with augmentin and cherritussin.  She took delsym, mucinex.  She was coughing up brown mucus and sometimes vomited afterwards.  She still feels chest tightness and dyspnea.  She feels a little better this morning.  She is only taking Symbicort and Spiriva right now, Xopenex prn.  She She saw a doctor in [place] in the pulmonary clinic.  She wants a new inhaler.    Past Medical History  Diagnosis Date  . Rheumatoid arthritis     On methotrexate and orencia  . GERD (gastroesophageal reflux disease)   . Atrial fibrillation     a. s/p ablation 01/2012 in [facility] by Dr. Boyd Kerbs;  b. On sotalol & Xarelto;  c. 02/2014 Echo: EF 50-55%, mild conc LVH, nl LA size/structure;  d. Recurrent afib 8/15 & 08/29/2014.  [name] Urinary incontinence   . Colon polyps   . Depression with anxiety   . Barretts esophagus   . Rectal fistula   . Vitamin D deficiency   . Chest pain     a. 02/2014 Myoview: Ef 50%, no ischemia.  [name] COPD (chronic obstructive pulmonary disease)   . COPD (chronic obstructive pulmonary disease)      Review of Systems  Constitutional: Negative for  fever, chills and fatigue.  HENT: Negative for postnasal drip, rhinorrhea and sinus pressure.   Respiratory: Positive for cough and shortness of breath. Negative for wheezing.   Cardiovascular: Negative for chest pain, palpitations and leg swelling.       Objective:   Physical Exam Filed Vitals:   06/01/15 0905  BP: 128/68  Pulse: 72  Height: 5' 6.5" (1.689 m)  Weight: 196 lb (88.905 kg)  SpO2: 99%   RA  Gen: well appearing, no acute distress HEENT: NCAT, EOMi, OP clear,  PULM: crackles in RLL, normal effort CV: RRR, no mgr, no JVD AB: BS+, soft, nontender, no hsm Ext: warm, no edema, no clubbing, no cyanosis Derm: no rash or skin breakdown  May 2016 discharge summary reviewed per she was treated for an upper GI bleed next line  January 2016 chest x-ray report reviewed showing no infiltrate or lung abnormality    Assessment & Plan:   COPD (chronic obstructive pulmonary disease) It sounds as if she has had an exacerbation of COPD recently, but on exam today she has focal area of crackles in the right lower lobe which is worrisome for pneumonia. She does not appear septic based on her vital signs today but she only took about 3 days of anabiotic so and so I  worry that she still has some residual disease there.  She certainly has been experiencing worsening symptoms of her chronic COPD which is likely due to this recent respiratory infection.  She would like to change her inhalers based on some marketing that she read online, but given the recent infection I explained to her that her current medication regimen is adequate and I do not see an indication for change.  Compliance with medications is a problem. Also, she has been seen by [facility] pulmonary. I explained to her that it would be best if she try to pick one pulmonary clinic to work with so that there is not confusion with medications as apparently she was recently changed to Symbicort despite the most recent notes  from [facility] stating that she was on Advair.  Plan  She was given 3 more days of Augmentin and was instructed to take the remaining Augmentin she had at home for a complete course of 5 days Chest x-ray to rule out pneumonia Flutter valve to help with mucociliary clearance Mucinex scheduled for the next 3-5 days Continue Symbicort and Spiriva for now, advised to use a spacer with Symbicort Follow-up August or sooner if needed      Updated Medication List Outpatient Encounter Prescriptions as of 06/01/2015  Medication Sig  . amiodarone (PACERONE) 200 MG tablet Take 1 tablet (200 mg total) by mouth 2 (two) times daily.  [name] amoxicillin-clavulanate (AUGMENTIN) 875-125 MG per tablet Take 1 tablet by mouth 2 (two) times daily.  . budesonide-formoterol (SYMBICORT) 160-4.5 MCG/ACT inhaler Inhale 2 puffs into the lungs 2 (two) times daily.  . cholestyramine (QUESTRAN) 4 G packet Take 1 packet by mouth as needed.  . clonazePAM (KLONOPIN) 0.5 MG tablet Take 1 tablet (0.5 mg total) by mouth 3 (three) times daily as needed.  [name] escitalopram (LEXAPRO) 20 MG tablet Take 1 tablet (20 mg total) by mouth daily.  . ferrous sulfate 325 (65 FE) MG tablet Take 1 tablet (325 mg total) by mouth daily with breakfast.  . gabapentin (NEURONTIN) 300 MG capsule Take 2 capsules (600 mg total) by mouth at bedtime.  . hydroxychloroquine (PLAQUENIL) 200 MG tablet Take 200 mg by mouth 2 (two) times daily.  [name] leflunomide (ARAVA) 20 MG tablet Take 20 mg by mouth daily.  [name] levalbuterol (XOPENEX HFA) 45 MCG/ACT inhaler Inhale 2 puffs into the lungs every 4 (four) hours as needed for wheezing.  . magnesium oxide (MAG-OX) 400 (241.3 MG) MG tablet Take 1 tablet (400 mg total) by mouth daily.  . metoprolol succinate (TOPROL-XL) 25 MG 24 hr tablet   . predniSONE (DELTASONE) 5 MG tablet Take 5 mg by mouth as needed.  . tiotropium (SPIRIVA) 18 MCG inhalation capsule Place 18 mcg into inhaler and inhale daily.  . traMADol (ULTRAM) 50 MG  tablet Take 1 tablet (50 mg total) by mouth 2 (two) times daily.  . [DISCONTINUED] amoxicillin-clavulanate (AUGMENTIN) 875-125 MG per tablet Take 1 tablet by mouth 2 (two) times daily.  . [DISCONTINUED] benzonatate (TESSALON) 100 MG capsule Take 1 capsule (100 mg total) by mouth 2 (two) times daily as needed for cough.   No facility-administered encounter medications on file as of 06/01/2015.

## 2015-06-01 NOTE — Patient Instructions (Signed)
We will call you with the results of the chest x-ray Use the flutter valve 3-4 times a day to help expel mucus Take guaifenesin 600 mg long-acting tablets twice a day Take the Augmentin you have at home as well as the Augmentin I have provided use of that you are taking 875 mg twice a day for 5 days, do not stop this early, take it with a probiotic such as yogurt Continue taking the Spiriva and Symbicort for now  We will see you back in August or sooner if needed

## 2015-06-01 NOTE — Assessment & Plan Note (Signed)
It sounds as if she has had an exacerbation of COPD recently, but on exam today she has focal area of crackles in the right lower lobe which is worrisome for pneumonia. She does not appear septic based on her vital signs today but she only took about 3 days of anabiotic so and so I worry that she still has some residual disease there.  She certainly has been experiencing worsening symptoms of her chronic COPD which is likely due to this recent respiratory infection.  She would like to change her inhalers based on some marketing that she read online, but given the recent infection I explained to her that her current medication regimen is adequate and I do not see an indication for change.  Compliance with medications is a problem. Also, she has been seen by [facility] pulmonary. I explained to her that it would be best if she try to pick one pulmonary clinic to work with so that there is not confusion with medications as apparently she was recently changed to Symbicort despite the most recent notes from [facility] stating that she was on Advair.  Plan  She was given 3 more days of Augmentin and was instructed to take the remaining Augmentin she had at home for a complete course of 5 days Chest x-ray to rule out pneumonia Flutter valve to help with mucociliary clearance Mucinex scheduled for the next 3-5 days Continue Symbicort and Spiriva for now, advised to use a spacer with Symbicort Follow-up August or sooner if needed

## 2015-06-14 ENCOUNTER — Ambulatory Visit (INDEPENDENT_AMBULATORY_CARE_PROVIDER_SITE_OTHER): Payer: Medicare Other | Admitting: Nurse Practitioner

## 2015-06-14 VITALS — BP 112/64 | HR 71 | Temp 97.9°F | Resp 16 | Ht 66.0 in | Wt 201.4 lb

## 2015-06-14 DIAGNOSIS — D62 Acute posthemorrhagic anemia: Secondary | ICD-10-CM

## 2015-06-14 DIAGNOSIS — R251 Tremor, unspecified: Secondary | ICD-10-CM | POA: Diagnosis not present

## 2015-06-14 LAB — CBC WITH DIFFERENTIAL/PLATELET
BASOS ABS: 0 10*3/uL (ref 0.0–0.1)
BASOS PCT: 0.8 % (ref 0.0–3.0)
Eosinophils Absolute: 0 10*3/uL (ref 0.0–0.7)
Eosinophils Relative: 0 % (ref 0.0–5.0)
HCT: 30 % — ABNORMAL LOW (ref 36.0–46.0)
HEMOGLOBIN: 9.3 g/dL — AB (ref 12.0–15.0)
Lymphocytes Relative: 27.4 % (ref 12.0–46.0)
Lymphs Abs: 1.6 10*3/uL (ref 0.7–4.0)
MCHC: 30.9 g/dL (ref 30.0–36.0)
MCV: 70.7 fl — AB (ref 78.0–100.0)
MONO ABS: 0.7 10*3/uL (ref 0.1–1.0)
Monocytes Relative: 12.9 % — ABNORMAL HIGH (ref 3.0–12.0)
NEUTROS ABS: 3.4 10*3/uL (ref 1.4–7.7)
NEUTROS PCT: 58.9 % (ref 43.0–77.0)
Platelets: 215 10*3/uL (ref 150.0–400.0)
RBC: 4.25 Mil/uL (ref 3.87–5.11)
RDW: 19 % — ABNORMAL HIGH (ref 11.5–15.5)
WBC: 5.7 10*3/uL (ref 4.0–10.5)

## 2015-06-14 MED ORDER — GABAPENTIN 300 MG PO CAPS: 300.0000 mg | ORAL_CAPSULE | Freq: Three times a day (TID) | ORAL | Status: DC

## 2015-06-14 MED ORDER — CLONAZEPAM 0.5 MG PO TABS: 0.5000 mg | ORAL_TABLET | Freq: Three times a day (TID) | ORAL | Status: DC | PRN

## 2015-06-14 NOTE — Progress Notes (Signed)
   Subjective:    Patient ID: Brandy Armstrong, female    DOB: May 01, 1946, 69 y.o.   MRN: [id number]  HPI  Brandy Armstrong is a 69 yo female with a CC of tremors- chronic, worsening.  1) 2 weeks worsening  Writing affected, key into the door, holding a fork, she feels this is interfering with life currently.   Review of Systems  Constitutional: Negative for fever, chills, diaphoresis and fatigue.  Respiratory: Negative for chest tightness, shortness of breath and wheezing.   Cardiovascular: Negative for chest pain, palpitations and leg swelling.  Gastrointestinal: Negative for nausea, vomiting, diarrhea and rectal pain.  Skin: Negative for rash.  Neurological: Positive for tremors. Negative for dizziness, weakness, numbness and headaches.  Psychiatric/Behavioral: The patient is not nervous/anxious.       Objective:   Physical Exam  Constitutional: She is oriented to person, place, and time. She appears well-developed and well-nourished. No distress.  BP 112/64 mmHg  Pulse 71  Temp(Src) 97.9 F (36.6 C)  Resp 16  Ht 5\' 6"  (1.676 m)  Wt 201 lb 6.4 oz (91.354 kg)  BMI 32.52 kg/m2  SpO2 94%   HENT:  Head: Normocephalic and atraumatic.  Right Ear: External ear normal.  Left Ear: External ear normal.  Cardiovascular: Normal rate, regular rhythm, normal heart sounds and intact distal pulses.  Exam reveals no gallop and no friction rub.   No murmur heard. Pulmonary/Chest: Effort normal and breath sounds normal. No respiratory distress. She has no wheezes. She has no rales. She exhibits no tenderness.  Neurological: She is alert and oriented to person, place, and time. No cranial nerve deficit. She exhibits normal muscle tone. Coordination normal.  Intention tremor of bilateral hands and slight head tremor  Skin: Skin is warm and dry. No rash noted. She is not diaphoretic.  Psychiatric: She has a normal mood and affect. Her behavior is normal. Judgment and thought content normal.        Assessment & Plan:

## 2015-06-14 NOTE — Progress Notes (Signed)
Pre visit review using our clinic review tool, if applicable. No additional management support is needed unless otherwise documented below in the visit note. 

## 2015-06-14 NOTE — Patient Instructions (Signed)
Try the Gabapentin 300 mg two tablets at night then 1 tablet in the morning. Can spread to 1 am , 1 at noon, and 1 at night Klonopin up to twice daily if it doesn't make you sleepy.   Follow up in 1 month.

## 2015-06-18 DIAGNOSIS — R251 Tremor, unspecified: Secondary | ICD-10-CM | POA: Insufficient documentation

## 2015-06-19 ENCOUNTER — Encounter: Payer: Self-pay | Admitting: Nurse Practitioner

## 2015-06-19 NOTE — Assessment & Plan Note (Signed)
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 Asked pt if she wants referral to neuro to r/out others like parkinsons and she refused at this time. Does not drink alcohol so unsure of response. Primidone is not compatible with pacerone. Propanolol- taking metoprolol already. Will try Gabapentin increased up to 3 x daily as goal. Pt is agreeable to try this.FU in 1 month

## 2015-06-26 ENCOUNTER — Other Ambulatory Visit: Payer: Self-pay

## 2015-06-26 DIAGNOSIS — K219 Gastro-esophageal reflux disease without esophagitis: Secondary | ICD-10-CM | POA: Diagnosis not present

## 2015-06-26 DIAGNOSIS — K922 Gastrointestinal hemorrhage, unspecified: Secondary | ICD-10-CM | POA: Diagnosis not present

## 2015-06-26 DIAGNOSIS — M069 Rheumatoid arthritis, unspecified: Secondary | ICD-10-CM | POA: Diagnosis not present

## 2015-06-26 DIAGNOSIS — R600 Localized edema: Secondary | ICD-10-CM | POA: Diagnosis not present

## 2015-06-26 DIAGNOSIS — I4891 Unspecified atrial fibrillation: Secondary | ICD-10-CM | POA: Diagnosis not present

## 2015-06-29 ENCOUNTER — Other Ambulatory Visit: Payer: Self-pay | Admitting: Nurse Practitioner

## 2015-07-14 ENCOUNTER — Ambulatory Visit: Payer: Medicare Other | Admitting: Nurse Practitioner

## 2015-07-18 ENCOUNTER — Other Ambulatory Visit: Payer: Self-pay | Admitting: Nurse Practitioner

## 2015-08-02 ENCOUNTER — Telehealth: Payer: Self-pay | Admitting: *Deleted

## 2015-08-02 NOTE — Telephone Encounter (Addendum)
Patient called and needs another RX sent to her pharmacy for Flexeril. Patient is complaining of back pain.  A prescription was sent to her pharmacy on 07/18/15 which needs a prior auth. Pt states that the dosage is not enough and she wants a prescription for 7.5 mg. Please advise NP

## 2015-08-03 ENCOUNTER — Other Ambulatory Visit: Payer: Self-pay | Admitting: Nurse Practitioner

## 2015-08-03 ENCOUNTER — Ambulatory Visit (INDEPENDENT_AMBULATORY_CARE_PROVIDER_SITE_OTHER): Payer: Medicare Other | Admitting: Nurse Practitioner

## 2015-08-03 VITALS — BP 114/70 | HR 77 | Temp 98.2°F | Resp 16 | Ht 66.0 in | Wt 199.0 lb

## 2015-08-03 DIAGNOSIS — M546 Pain in thoracic spine: Secondary | ICD-10-CM | POA: Diagnosis not present

## 2015-08-03 MED ORDER — CYCLOBENZAPRINE HCL 10 MG PO TABS: 10.0000 mg | ORAL_TABLET | Freq: Every day | ORAL | Status: DC

## 2015-08-03 MED ORDER — PREDNISONE 20 MG PO TABS: 20.0000 mg | ORAL_TABLET | Freq: Every day | ORAL | Status: DC

## 2015-08-03 NOTE — Telephone Encounter (Signed)
Patient called and states that her insurance want cover and her medication needs prior auth. Please contact pt back when authorized. Thank you

## 2015-08-03 NOTE — Telephone Encounter (Signed)
LVM for pt to pick up medication from pharmacy

## 2015-08-03 NOTE — Patient Instructions (Signed)
Try warm pad on your back, light stretching- if it hurts don't do it.  Prednisone 20 mg 1 tablet a day for 5 days.

## 2015-08-03 NOTE — Progress Notes (Signed)
   Subjective:    Patient ID: NAYDELINE MORACE, female    DOB: [birth year], 69 y.o.   MRN: [id number]  HPI  Ms. Basden is a 69 yo female with a CC of back pain x 3 days.   1) Middle of her thoracic spine, with twisting, goes out to sides of back, better with lying down. Denies sciatica, denies interference with ADLs,   Advil, Tramadol, Flexeril- not helpful  Pt reports she has leftover hydrocodone and took a half- helpful  Wt Readings from Last 3 Encounters:  08/03/15 199 lb (90.266 kg)  06/14/15 201 lb 6.4 oz (91.354 kg)  06/01/15 196 lb (88.905 kg)   Review of Systems  Constitutional: Negative for fever, chills, diaphoresis and fatigue.  Respiratory: Negative for chest tightness, shortness of breath and wheezing.   Cardiovascular: Negative for chest pain, palpitations and leg swelling.  Gastrointestinal: Negative for nausea, vomiting and diarrhea.  Musculoskeletal: Positive for back pain. Negative for myalgias, joint swelling, arthralgias, gait problem, neck pain and neck stiffness.  Skin: Negative for rash.  Neurological: Positive for tremors. Negative for dizziness, weakness, numbness and headaches.       Denies losing bowel/bladder function, saddle anesthesia, or weakness of extremities.   Psychiatric/Behavioral: The patient is not nervous/anxious.        Objective:   Physical Exam  Constitutional: She is oriented to person, place, and time. She appears well-developed and well-nourished. No distress.  BP 114/70 mmHg  Pulse 77  Temp(Src) 98.2 F (36.8 C)  Resp 16  Ht 5\' 6"  (1.676 m)  Wt 199 lb (90.266 kg)  BMI 32.13 kg/m2  SpO2 94%   HENT:  Head: Normocephalic and atraumatic.  Right Ear: External ear normal.  Left Ear: External ear normal.  Cardiovascular: Normal rate, regular rhythm, normal heart sounds and intact distal pulses.  Exam reveals no gallop and no friction rub.   No murmur heard. Pulmonary/Chest: Effort normal and breath sounds normal. No respiratory  distress. She has no wheezes. She has no rales. She exhibits no tenderness.  Musculoskeletal:  Iliopsoas 5/5 Bilateral, Tib anterior 5/5 bilateral, EHL 5/5 bilateral, no ankle clonus, intact heel/toe/sequential walking, sensation intact upper and lower extremities. Straight leg raise negative bilaterally.   Neurological: She is alert and oriented to person, place, and time. No cranial nerve deficit. She exhibits normal muscle tone. Coordination normal.  Skin: Skin is warm and dry. No rash noted. She is not diaphoretic.  Psychiatric: She has a normal mood and affect. Her behavior is normal. Judgment and thought content normal.          Assessment & Plan:

## 2015-08-03 NOTE — Progress Notes (Signed)
Pre visit review using our clinic review tool, if applicable. No additional management support is needed unless otherwise documented below in the visit note. 

## 2015-08-03 NOTE — Telephone Encounter (Signed)
Flexeril 10 mg (the next step from 5 mg) was sent to her pharmacy. Thanks!

## 2015-08-04 ENCOUNTER — Telehealth: Payer: Self-pay

## 2015-08-04 NOTE — Telephone Encounter (Signed)
PA started for Cyclobenzaprine 10mg  tablets.  Awaiting results

## 2015-08-07 ENCOUNTER — Other Ambulatory Visit: Payer: Self-pay | Admitting: Nurse Practitioner

## 2015-08-10 ENCOUNTER — Encounter: Payer: Self-pay | Admitting: Nurse Practitioner

## 2015-08-10 DIAGNOSIS — M546 Pain in thoracic spine: Secondary | ICD-10-CM | POA: Insufficient documentation

## 2015-08-10 DIAGNOSIS — M549 Dorsalgia, unspecified: Secondary | ICD-10-CM | POA: Insufficient documentation

## 2015-08-10 NOTE — Assessment & Plan Note (Signed)
Pt denies trauma, has normal exam. Worsening pain over 3 days. Pt reports the only thing helpful was hydrocodone. Discussed getting an x-ray and pt refused. She has no findings on exam, and trouble recreating the position with pain. Discussed with pt that hydrocodone is not the therapy of choice. Will do Prednisone 20 mg x 5 days, then back to her normal dosage. Stretches given to pt and asked her to try a warm pad. Pt agreeable. FU prn worsening/failure to improve.

## 2015-08-14 ENCOUNTER — Ambulatory Visit: Payer: Medicare Other | Admitting: Nurse Practitioner

## 2015-08-15 ENCOUNTER — Emergency Department
Admission: EM | Admit: 2015-08-15 | Discharge: 2015-08-15 | Disposition: A | Discharge status: Home / Self Care | Payer: Medicare Other | Attending: Emergency Medicine | Admitting: Emergency Medicine

## 2015-08-15 ENCOUNTER — Encounter: Payer: Self-pay | Admitting: Emergency Medicine

## 2015-08-15 ENCOUNTER — Emergency Department: Payer: Medicare Other

## 2015-08-15 DIAGNOSIS — F329 Major depressive disorder, single episode, unspecified: Secondary | ICD-10-CM | POA: Diagnosis not present

## 2015-08-15 DIAGNOSIS — Y9389 Activity, other specified: Secondary | ICD-10-CM | POA: Insufficient documentation

## 2015-08-15 DIAGNOSIS — Z79899 Other long term (current) drug therapy: Secondary | ICD-10-CM | POA: Diagnosis not present

## 2015-08-15 DIAGNOSIS — Z7952 Long term (current) use of systemic steroids: Secondary | ICD-10-CM | POA: Diagnosis not present

## 2015-08-15 DIAGNOSIS — Z87891 Personal history of nicotine dependence: Secondary | ICD-10-CM | POA: Diagnosis not present

## 2015-08-15 DIAGNOSIS — W228XXA Striking against or struck by other objects, initial encounter: Secondary | ICD-10-CM | POA: Diagnosis not present

## 2015-08-15 DIAGNOSIS — S92531A Displaced fracture of distal phalanx of right lesser toe(s), initial encounter for closed fracture: Secondary | ICD-10-CM | POA: Diagnosis not present

## 2015-08-15 DIAGNOSIS — Z7951 Long term (current) use of inhaled steroids: Secondary | ICD-10-CM | POA: Insufficient documentation

## 2015-08-15 DIAGNOSIS — S92524A Nondisplaced fracture of medial phalanx of right lesser toe(s), initial encounter for closed fracture: Secondary | ICD-10-CM | POA: Diagnosis not present

## 2015-08-15 DIAGNOSIS — Z91040 Latex allergy status: Secondary | ICD-10-CM | POA: Diagnosis not present

## 2015-08-15 DIAGNOSIS — S99921A Unspecified injury of right foot, initial encounter: Secondary | ICD-10-CM | POA: Diagnosis present

## 2015-08-15 DIAGNOSIS — S92911A Unspecified fracture of right toe(s), initial encounter for closed fracture: Secondary | ICD-10-CM

## 2015-08-15 DIAGNOSIS — Y998 Other external cause status: Secondary | ICD-10-CM | POA: Insufficient documentation

## 2015-08-15 DIAGNOSIS — Y9289 Other specified places as the place of occurrence of the external cause: Secondary | ICD-10-CM | POA: Diagnosis not present

## 2015-08-15 MED ORDER — TRAMADOL HCL 50 MG PO TABS
50.0000 mg | ORAL_TABLET | Freq: Once | ORAL | Status: AC
  Administered 2015-08-15: 50 mg via ORAL
  Filled 2015-08-15: qty 1

## 2015-08-15 MED ORDER — IBUPROFEN 800 MG PO TABS
800.0000 mg | ORAL_TABLET | Freq: Once | ORAL | Status: AC
  Administered 2015-08-15: 800 mg via ORAL
  Filled 2015-08-15: qty 1

## 2015-08-15 MED ORDER — TRAMADOL HCL 50 MG PO TABS: 50.0000 mg | ORAL_TABLET | Freq: Four times a day (QID) | ORAL | Status: DC | PRN

## 2015-08-15 MED ORDER — IBUPROFEN 800 MG PO TABS: 800.0000 mg | ORAL_TABLET | Freq: Three times a day (TID) | ORAL | Status: DC | PRN

## 2015-08-15 NOTE — ED Provider Notes (Signed)
[facility] Emergency Department Provider Note  ____________________________________________  Time seen: Approximately 5:15 PM  I have reviewed the triage vital signs and the nursing notes.   HISTORY  Chief Complaint Toe Pain    HPI Brandy Armstrong is a 69 y.o. female patient complaining of pain to the third digit right foot secondary to stopping the last night. Patient states since the onset this been increased swelling and ecchymosis to the toe. Patient state increased pain with ambulation. Patient rated the pain as 8/10.   Past Medical History  Diagnosis Date  . Rheumatoid arthritis     On methotrexate and orencia  . GERD (gastroesophageal reflux disease)   . Atrial fibrillation     a. s/p ablation 01/2012 in [facility] by Dr. Boyd Kerbs;  b. On sotalol & Xarelto;  c. 02/2014 Echo: EF 50-55%, mild conc LVH, nl LA size/structure;  d. Recurrent afib 8/15 & 08/29/2014.  [name] Urinary incontinence   . Colon polyps   . Depression with anxiety   . Barretts esophagus   . Rectal fistula   . Vitamin D deficiency   . Chest pain     a. 02/2014 Myoview: Ef 50%, no ischemia.  [name] COPD (chronic obstructive pulmonary disease)   . COPD (chronic obstructive pulmonary disease)     Patient Active Problem List   Diagnosis Date Noted  . Back pain 08/10/2015  . Midline thoracic back pain 08/10/2015  . Tremors of nervous system 06/18/2015  . Acute blood loss anemia 05/24/2015  . Hypokalemia 05/24/2015  . Breast tenderness 05/13/2015  . GI bleed due to NSAIDs 05/04/2015  . Dysuria 03/07/2015  . Medicare annual wellness visit, subsequent 01/26/2015  . Hospital discharge follow-up 01/13/2015  . Atrial fibrillation 03/11/2014  . COPD (chronic obstructive pulmonary disease) 03/11/2014  . Neck pain on left side 03/02/2014  . Left ankle pain 09/17/2013  . Routine general medical examination at a health care facility 08/18/2013  . Fatigue 08/18/2013  . Barrett's esophagus  08/18/2013  . Chronic diarrhea 08/18/2013  . Numbness and tingling 08/18/2013  . Screening for breast cancer 08/18/2013  . Rectal fistula 08/18/2013    Past Surgical History  Procedure Laterality Date  . Cholecystectomy  1987  . Abdominal hysterectomy  1992  . Oophrectomy Bilateral 1992  . Cardiac electrophysiology study and ablation  2013  . Esophagogastroduodenoscopy N/A 05/06/2015    Procedure: ESOPHAGOGASTRODUODENOSCOPY (EGD);  Surgeon: Lollie Sails, MD;  Location: [facility] ENDOSCOPY;  Service: Endoscopy;  Laterality: N/A;  . Colonoscopy N/A 05/08/2015    Procedure: COLONOSCOPY;  Surgeon: Manya Silvas, MD;  Location: [facility] ENDOSCOPY;  Service: Endoscopy;  Laterality: N/A;    Current Outpatient Rx  Name  Route  Sig  Dispense  Refill  . amiodarone (PACERONE) 200 MG tablet   Oral   Take 1 tablet (200 mg total) by mouth 2 (two) times daily. Patient taking differently: Take 200 mg by mouth daily.    60 tablet   1   . budesonide-formoterol (SYMBICORT) 160-4.5 MCG/ACT inhaler   Inhalation   Inhale 2 puffs into the lungs 2 (two) times daily.         . cholestyramine (QUESTRAN) 4 G packet   Oral   Take 1 packet by mouth as needed.         . clonazePAM (KLONOPIN) 0.5 MG tablet   Oral   Take 1 tablet (0.5 mg total) by mouth 3 (three) times daily as needed for anxiety.   [place]  tablet   1   . escitalopram (LEXAPRO) 20 MG tablet      TAKE 1 TABLET BY MOUTH DAILY   30 tablet   2   . ferrous sulfate 325 (65 FE) MG tablet   Oral   Take 1 tablet (325 mg total) by mouth daily with breakfast.   1 tablet   3   . gabapentin (NEURONTIN) 300 MG capsule      TAKE 1 CAPSULE (300 MG TOTAL) BY MOUTH 3 (THREE) TIMES DAILY.   90 capsule   1   . hydroxychloroquine (PLAQUENIL) 200 MG tablet   Oral   Take 200 mg by mouth 2 (two) times daily.         [name] ibuprofen (ADVIL,MOTRIN) 800 MG tablet   Oral   Take 1 tablet (800 mg total) by mouth every 8 (eight) hours as needed for  moderate pain.   15 tablet   0   . leflunomide (ARAVA) 20 MG tablet   Oral   Take 20 mg by mouth daily.         [name] levalbuterol (XOPENEX HFA) 45 MCG/ACT inhaler   Inhalation   Inhale 2 puffs into the lungs every 4 (four) hours as needed for wheezing.         . magnesium oxide (MAG-OX) 400 (241.3 MG) MG tablet   Oral   Take 1 tablet (400 mg total) by mouth daily.   10 tablet   0   . metoprolol succinate (TOPROL-XL) 25 MG 24 hr tablet            6   . predniSONE (DELTASONE) 20 MG tablet   Oral   Take 1 tablet (20 mg total) by mouth daily with breakfast.   5 tablet   0   . Respiratory Therapy Supplies (FLUTTER) DEVI      Use as directed.   1 each   0   . tiotropium (SPIRIVA) 18 MCG inhalation capsule   Inhalation   Place 18 mcg into inhaler and inhale daily.         . traMADol (ULTRAM) 50 MG tablet   Oral   Take 1 tablet (50 mg total) by mouth 2 (two) times daily.   60 tablet   0   . traMADol (ULTRAM) 50 MG tablet   Oral   Take 1 tablet (50 mg total) by mouth every 6 (six) hours as needed for moderate pain.   12 tablet   0     Allergies Latex  Family History  Problem Relation Age of Onset  . Depression Mother   . Cancer Mother 55    pancreatic cancer  . Cancer Father 40    colon cancer  . Cancer Sister 30    breast cancer  . Diabetes Brother     Social History Social History  Substance Use Topics  . Smoking status: Former Smoker -- 1.00 packs/day for 40 years    Quit date: 12/16/2011  . Smokeless tobacco: Never Used  . Alcohol Use: No     Comment: Occasionally has a drink    Review of Systems Constitutional: No fever/chills Eyes: No visual changes. ENT: No sore throat. Cardiovascular: Denies chest pain. Respiratory: Denies shortness of breath. Gastrointestinal: No abdominal pain.  No nausea, no vomiting.  No diarrhea.  No constipation. Genitourinary: Negative for dysuria. Musculoskeletal: Pain and swelling to the third toe right  foot. Skin: Negative for rash. Neurological: Negative for headaches, focal weakness or numbness. Psychiatric:Depression Hematological/Lymphatic:  Allergic/Immunilogical: Latex 10-point ROS otherwise negative.  ____________________________________________   PHYSICAL EXAM:  VITAL SIGNS: ED Triage Vitals  Enc Vitals Group     BP 08/15/15 1646 115/65 mmHg     Pulse Rate 08/15/15 1646 77     Resp 08/15/15 1646 18     Temp 08/15/15 1646 98.9 F (37.2 C)     Temp Source 08/15/15 1646 Oral     SpO2 08/15/15 1646 98 %     Weight 08/15/15 1641 198 lb (89.812 kg)     Height 08/15/15 1641 5\' 6"  (1.676 m)     Head Cir --      Peak Flow --      Pain Score 08/15/15 1641 8     Pain Loc --      Pain Edu? --      Excl. in [place]? --     Constitutional: Alert and oriented. Well appearing and in no acute distress. Eyes: Conjunctivae are normal. PERRL. EOMI. Head: Atraumatic. Nose: No congestion/rhinnorhea. Mouth/Throat: Mucous membranes are moist.  Oropharynx non-erythematous. Neck: No stridor.  No cervical spine tenderness to palpation. Hematological/Lymphatic/Immunilogical: No cervical lymphadenopathy. Cardiovascular: Normal rate, regular rhythm. Grossly normal heart sounds.  Good peripheral circulation. Respiratory: Normal respiratory effort.  No retractions. Lungs CTAB. Gastrointestinal: Soft and nontender. No distention. No abdominal bruits. No CVA tenderness. Genitourinary:  Musculoskeletal: He edema to the third digit right foot  Neurologic:  Normal speech and language. No gross focal neurologic deficits are appreciated. No gait instability. Skin:  Skin is warm, dry and intact. No rash noted. Ecchymosis to the third digit right foot Psychiatric: Mood and affect are normal. Speech and behavior are normal.  ____________________________________________   LABS (all labs ordered are listed, but only abnormal results are displayed)  Labs Reviewed - No data to  display ____________________________________________  EKG   ____________________________________________  RADIOLOGY  Transverse fracture of the distal phalanx third digit right foot.  I, Sable Feil, personally viewed and evaluated these images (plain radiographs) as part of my medical decision making.   ____________________________________________   PROCEDURES  Procedure(s) performed: None  Critical Care performed: No  ____________________________________________   INITIAL IMPRESSION / ASSESSMENT AND PLAN / ED COURSE  Pertinent labs & imaging results that were available during my care of the patient were reviewed by me and considered in my medical decision making (see chart for details).  Fracture third toe right foot. Patient also buddy taped. Patient placed an open shoe. Patient is a prescription for tramadol and ibuprofen. Patient advised follow-up for orthopedics. ____________________________________________   FINAL CLINICAL IMPRESSION(S) / ED DIAGNOSES  Final diagnoses:  Toe fracture, right, closed, initial encounter      Sable Feil, PA-C 08/15/15 Alma Yao, MD 08/17/15 [phone]

## 2015-08-15 NOTE — ED Notes (Signed)
C/o pain to right 3rd toe, states she hit it on something last night but does not remember what

## 2015-08-22 ENCOUNTER — Telehealth: Payer: Self-pay | Admitting: Nurse Practitioner

## 2015-08-27 ENCOUNTER — Other Ambulatory Visit: Payer: Self-pay | Admitting: Nurse Practitioner

## 2015-08-28 NOTE — Telephone Encounter (Signed)
Last OV 8.4.16.  Please advise refill

## 2015-08-29 ENCOUNTER — Other Ambulatory Visit: Payer: Self-pay | Admitting: Nurse Practitioner

## 2015-08-29 DIAGNOSIS — S92911S Unspecified fracture of right toe(s), sequela: Secondary | ICD-10-CM

## 2015-09-05 ENCOUNTER — Ambulatory Visit: Payer: Medicare Other | Admitting: Podiatry

## 2015-09-14 ENCOUNTER — Other Ambulatory Visit: Payer: Self-pay | Admitting: Nurse Practitioner

## 2015-09-28 ENCOUNTER — Emergency Department
Admission: EM | Admit: 2015-09-28 | Discharge: 2015-09-28 | Disposition: A | Discharge status: Home / Self Care | Payer: Medicare Other | Attending: Emergency Medicine | Admitting: Emergency Medicine

## 2015-09-28 DIAGNOSIS — Z792 Long term (current) use of antibiotics: Secondary | ICD-10-CM | POA: Insufficient documentation

## 2015-09-28 DIAGNOSIS — Z87891 Personal history of nicotine dependence: Secondary | ICD-10-CM | POA: Diagnosis not present

## 2015-09-28 DIAGNOSIS — Z7952 Long term (current) use of systemic steroids: Secondary | ICD-10-CM | POA: Insufficient documentation

## 2015-09-28 DIAGNOSIS — L02415 Cutaneous abscess of right lower limb: Secondary | ICD-10-CM | POA: Insufficient documentation

## 2015-09-28 DIAGNOSIS — L02224 Furuncle of groin: Secondary | ICD-10-CM | POA: Diagnosis present

## 2015-09-28 DIAGNOSIS — Z7951 Long term (current) use of inhaled steroids: Secondary | ICD-10-CM | POA: Insufficient documentation

## 2015-09-28 DIAGNOSIS — Z91040 Latex allergy status: Secondary | ICD-10-CM | POA: Insufficient documentation

## 2015-09-28 DIAGNOSIS — Z79899 Other long term (current) drug therapy: Secondary | ICD-10-CM | POA: Diagnosis not present

## 2015-09-28 MED ORDER — HYDROCODONE-ACETAMINOPHEN 5-325 MG PO TABS: 1.0000 | ORAL_TABLET | ORAL | Status: DC | PRN

## 2015-09-28 MED ORDER — SULFAMETHOXAZOLE-TRIMETHOPRIM 800-160 MG PO TABS: 1.0000 | ORAL_TABLET | Freq: Two times a day (BID) | ORAL | Status: DC

## 2015-09-28 NOTE — ED Provider Notes (Signed)
[facility] Emergency Department Provider Note  ____________________________________________  Time seen: Approximately 2:15 PM  I have reviewed the triage vital signs and the nursing notes.   HISTORY  Chief Complaint Abscess   HPI Brandy Armstrong is a 69 y.o. female chief complaint of "boil" in the right groin area that she noticed on 927. She states that she has been squeezing at it and has only gotten blood out of it. She denies any fever or chills. She denies having had an insect bite. She denies any previous abscesses or the mention of MRSA. Patient states that when she is walking on her pain is 10 out of 10.   Past Medical History  Diagnosis Date  . Rheumatoid arthritis     On methotrexate and orencia  . GERD (gastroesophageal reflux disease)   . Atrial fibrillation     a. s/p ablation 01/2012 in [facility] by Dr. Boyd Kerbs;  b. On sotalol & Xarelto;  c. 02/2014 Echo: EF 50-55%, mild conc LVH, nl LA size/structure;  d. Recurrent afib 8/15 & 08/29/2014.  [name] Urinary incontinence   . Colon polyps   . Depression with anxiety   . Barretts esophagus   . Rectal fistula   . Vitamin D deficiency   . Chest pain     a. 02/2014 Myoview: Ef 50%, no ischemia.  [name] COPD (chronic obstructive pulmonary disease)   . COPD (chronic obstructive pulmonary disease)     Patient Active Problem List   Diagnosis Date Noted  . Back pain 08/10/2015  . Midline thoracic back pain 08/10/2015  . Tremors of nervous system 06/18/2015  . Acute blood loss anemia 05/24/2015  . Hypokalemia 05/24/2015  . Breast tenderness 05/13/2015  . GI bleed due to NSAIDs 05/04/2015  . Dysuria 03/07/2015  . Medicare annual wellness visit, subsequent 01/26/2015  . Hospital discharge follow-up 01/13/2015  . Atrial fibrillation 03/11/2014  . COPD (chronic obstructive pulmonary disease) 03/11/2014  . Neck pain on left side 03/02/2014  . Left ankle pain 09/17/2013  . Routine general medical examination at  a health care facility 08/18/2013  . Fatigue 08/18/2013  . Barrett's esophagus 08/18/2013  . Chronic diarrhea 08/18/2013  . Numbness and tingling 08/18/2013  . Screening for breast cancer 08/18/2013  . Rectal fistula 08/18/2013    Past Surgical History  Procedure Laterality Date  . Cholecystectomy  1987  . Abdominal hysterectomy  1992  . Oophrectomy Bilateral 1992  . Cardiac electrophysiology study and ablation  2013  . Esophagogastroduodenoscopy N/A 05/06/2015    Procedure: ESOPHAGOGASTRODUODENOSCOPY (EGD);  Surgeon: Lollie Sails, MD;  Location: [facility] ENDOSCOPY;  Service: Endoscopy;  Laterality: N/A;  . Colonoscopy N/A 05/08/2015    Procedure: COLONOSCOPY;  Surgeon: Manya Silvas, MD;  Location: [facility] ENDOSCOPY;  Service: Endoscopy;  Laterality: N/A;    Current Outpatient Rx  Name  Route  Sig  Dispense  Refill  . amiodarone (PACERONE) 200 MG tablet   Oral   Take 1 tablet (200 mg total) by mouth 2 (two) times daily. Patient taking differently: Take 200 mg by mouth daily.    60 tablet   1   . budesonide-formoterol (SYMBICORT) 160-4.5 MCG/ACT inhaler   Inhalation   Inhale 2 puffs into the lungs 2 (two) times daily.         . cholestyramine (QUESTRAN) 4 G packet   Oral   Take 1 packet by mouth as needed.         . clonazePAM (KLONOPIN) 0.5 MG  tablet   Oral   Take 1 tablet (0.5 mg total) by mouth 3 (three) times daily as needed for anxiety.   90 tablet   1   . cyclobenzaprine (FLEXERIL) 10 MG tablet      TAKE 1 TABLET (10 MG TOTAL) BY MOUTH AT BEDTIME.   30 tablet   0   . escitalopram (LEXAPRO) 20 MG tablet      TAKE 1 TABLET BY MOUTH DAILY   30 tablet   2   . escitalopram (LEXAPRO) 20 MG tablet      TAKE 1 TABLET BY MOUTH DAILY   30 tablet   2   . ferrous sulfate 325 (65 FE) MG tablet   Oral   Take 1 tablet (325 mg total) by mouth daily with breakfast.   1 tablet   3   . gabapentin (NEURONTIN) 300 MG capsule      TAKE 1 CAPSULE (300 MG TOTAL)  BY MOUTH 3 (THREE) TIMES DAILY.   90 capsule   1   . gabapentin (NEURONTIN) 300 MG capsule      TAKE ONE CAPSULE BY MOUTH AT BEDTIME   30 capsule   1   . HYDROcodone-acetaminophen (NORCO/VICODIN) 5-325 MG tablet   Oral   Take 1 tablet by mouth every 4 (four) hours as needed for moderate pain.   20 tablet   0   . hydroxychloroquine (PLAQUENIL) 200 MG tablet   Oral   Take 200 mg by mouth 2 (two) times daily.         [name] ibuprofen (ADVIL,MOTRIN) 800 MG tablet   Oral   Take 1 tablet (800 mg total) by mouth every 8 (eight) hours as needed for moderate pain.   15 tablet   0   . leflunomide (ARAVA) 20 MG tablet   Oral   Take 20 mg by mouth daily.         [name] levalbuterol (XOPENEX HFA) 45 MCG/ACT inhaler   Inhalation   Inhale 2 puffs into the lungs every 4 (four) hours as needed for wheezing.         . magnesium oxide (MAG-OX) 400 (241.3 MG) MG tablet   Oral   Take 1 tablet (400 mg total) by mouth daily.   10 tablet   0   . metoprolol succinate (TOPROL-XL) 25 MG 24 hr tablet            6   . predniSONE (DELTASONE) 20 MG tablet   Oral   Take 1 tablet (20 mg total) by mouth daily with breakfast.   5 tablet   0   . Respiratory Therapy Supplies (FLUTTER) DEVI      Use as directed.   1 each   0   . sulfamethoxazole-trimethoprim (BACTRIM DS,SEPTRA DS) 800-160 MG tablet   Oral   Take 1 tablet by mouth 2 (two) times daily.   20 tablet   0   . tiotropium (SPIRIVA) 18 MCG inhalation capsule   Inhalation   Place 18 mcg into inhaler and inhale daily.           Allergies Latex  Family History  Problem Relation Age of Onset  . Depression Mother   . Cancer Mother 13    pancreatic cancer  . Cancer Father 40    colon cancer  . Cancer Sister 30    breast cancer  . Diabetes Brother     Social History Social History  Substance Use Topics  . Smoking status:  Former Smoker -- 1.00 packs/day for 40 years    Quit date: 12/16/2011  . Smokeless tobacco:  Never Used  . Alcohol Use: No     Comment: Occasionally has a drink    Review of Systems Constitutional: No fever/chills Cardiovascular: Denies chest pain. Respiratory: Denies shortness of breath. Gastrointestinal: No abdominal pain.  No nausea, no vomiting. Genitourinary: Negative for dysuria. Musculoskeletal: Negative for back pain. Skin: Negative for rash. Positive abscess Neurological: Negative for headaches, focal weakness or numbness.  10-point ROS otherwise negative.  ____________________________________________   PHYSICAL EXAM:  VITAL SIGNS: ED Triage Vitals  Enc Vitals Group     BP 09/28/15 1341 127/77 mmHg     Pulse Rate 09/28/15 1341 91     Resp 09/28/15 1341 16     Temp 09/28/15 1341 98 F (36.7 C)     Temp Source 09/28/15 1341 Oral     SpO2 09/28/15 1341 97 %     Weight 09/28/15 1341 197 lb (89.359 kg)     Height 09/28/15 1341 5\' 6"  (1.676 m)     Head Cir --      Peak Flow --      Pain Score --      Pain Loc --      Pain Edu? --      Excl. in [place]? --     Constitutional: Alert and oriented. Well appearing and in no acute distress. Eyes: Conjunctivae are normal. PERRL. EOMI. Head: Atraumatic. Nose: No congestion/rhinnorhea. Neck: No stridor.   Cardiovascular: Normal rate, regular rhythm. Grossly normal heart sounds.  Good peripheral circulation. Respiratory: Normal respiratory effort.  No retractions. Lungs CTAB. Gastrointestinal: Soft and nontender. No distention. Musculoskeletal: No lower extremity tenderness nor edema.  No joint effusions. Neurologic:  Normal speech and language. No gross focal neurologic deficits are appreciated. No gait instability. Skin:  Skin is warm, dry and intact. No rash noted. Right inner thigh there is an opened area without active drainage at this time. Skin surrounding is tender and warm to touch. Psychiatric: Mood and affect are normal. Speech and behavior are normal.  ____________________________________________    LABS (all labs ordered are listed, but only abnormal results are displayed)  Labs Reviewed - No data to display  PROCEDURES  Procedure(s) performed: None  Critical Care performed: No  ____________________________________________   INITIAL IMPRESSION / ASSESSMENT AND PLAN / ED COURSE  Pertinent labs & imaging results that were available during my care of the patient were reviewed by me and considered in my medical decision making (see chart for details).  She was placed on Septra DS for 10 days along with Norco as needed for pain. She is to continue use warm compresses if needed. She'll follow-up with her family doctor if any continued problems. ____________________________________________   FINAL CLINICAL IMPRESSION(S) / ED DIAGNOSES  Final diagnoses:  Abscess of right thigh      Johnn Hai, PA-C 09/28/15 [place], MD 09/28/15 1444

## 2015-09-28 NOTE — ED Notes (Signed)
Bandage applied to abscess.

## 2015-09-28 NOTE — ED Notes (Signed)
Patient comes in complaining of "boil" to right groin that she noticed Tuesday 9/27.  Patient unable to get primary care doctor to see her this week. Site is red, inflamed, and warm to touch.  Patient denies chances of being bitten by insect.

## 2015-09-28 NOTE — Discharge Instructions (Signed)
Abscess An abscess (boil or furuncle) is an infected area on or under the skin. This area is filled with yellowish-white fluid (pus) and other material (debris). HOME CARE   Only take medicines as told by your doctor.  If you were given antibiotic medicine, take it as directed. Finish the medicine even if you start to feel better.  If gauze is used, follow your doctor's directions for changing the gauze.  To avoid spreading the infection:  Keep your abscess covered with a bandage.  Wash your hands well.  Do not share personal care items, towels, or whirlpools with others.  Avoid skin contact with others.  Keep your skin and clothes clean around the abscess.  Keep all doctor visits as told. GET HELP RIGHT AWAY IF:   You have more pain, puffiness (swelling), or redness in the wound site.  You have more fluid or blood coming from the wound site.  You have muscle aches, chills, or you feel sick.  You have a fever. MAKE SURE YOU:   Understand these instructions.  Will watch your condition.  Will get help right away if you are not doing well or get worse. Document Released: 06/03/2008 Document Revised: 06/16/2012 Document Reviewed: 02/28/2012 [facility] Patient Information 2015 [place], [place]. This information is not intended to replace advice given to you by your health care provider. Make sure you discuss any questions you have with your health care provider.    Continue placing warm compresses to the area. Take antibiotic as prescribed and Norco as needed for pain. The aware that pain medication can cause drowsiness which may increase her chances of following. Follow-up with your doctor if any continued problems.

## 2015-10-05 ENCOUNTER — Encounter: Payer: Self-pay | Admitting: Nurse Practitioner

## 2015-10-05 ENCOUNTER — Ambulatory Visit (INDEPENDENT_AMBULATORY_CARE_PROVIDER_SITE_OTHER): Payer: Medicare Other | Admitting: Nurse Practitioner

## 2015-10-05 VITALS — BP 100/68 | HR 76 | Temp 98.2°F | Resp 14 | Ht 66.0 in

## 2015-10-05 DIAGNOSIS — Z131 Encounter for screening for diabetes mellitus: Secondary | ICD-10-CM

## 2015-10-05 DIAGNOSIS — G629 Polyneuropathy, unspecified: Secondary | ICD-10-CM

## 2015-10-05 DIAGNOSIS — Z79899 Other long term (current) drug therapy: Secondary | ICD-10-CM

## 2015-10-05 LAB — HEMOGLOBIN A1C: Hgb A1c MFr Bld: 6 % (ref 4.6–6.5)

## 2015-10-05 LAB — VITAMIN B12: VITAMIN B 12: 268 pg/mL (ref 211–911)

## 2015-10-05 MED ORDER — PREGABALIN 50 MG PO CAPS: 50.0000 mg | ORAL_CAPSULE | Freq: Three times a day (TID) | ORAL | Status: DC

## 2015-10-05 NOTE — Patient Instructions (Signed)
Neuropathic Pain Neuropathic pain is pain caused by damage to the nerves that are responsible for certain sensations in your body (sensory nerves). The pain can be caused by damage to:   The sensory nerves that send signals to your spinal cord and brain (peripheral nervous system).  The sensory nerves in your brain or spinal cord (central nervous system). Neuropathic pain can make you more sensitive to pain. What would be a minor sensation for most people may feel very painful if you have neuropathic pain. This is usually a long-term condition that can be difficult to treat. The type of pain can differ from person to person. It may start suddenly (acute), or it may develop slowly and last for a long time (chronic). Neuropathic pain may come and go as damaged nerves heal or may stay at the same level for years. It often causes emotional distress, loss of sleep, and a lower quality of life. CAUSES  The most common cause of damage to a sensory nerve is diabetes. Many other diseases and conditions can also cause neuropathic pain. Causes of neuropathic pain can be classified as:  Toxic. Many drugs and chemicals can cause toxic damage. The most common cause of toxic neuropathic pain is damage from drug treatment for cancer (chemotherapy).  Metabolic. This type of pain can happen when a disease causes imbalances that damage nerves. Diabetes is the most common of these diseases. Vitamin B deficiency caused by long-term alcohol abuse is another common cause.  Traumatic. Any injury that cuts, crushes, or stretches a nerve can cause damage and pain. A common example is feeling pain after losing an arm or leg (phantom limb pain).  Compression-related. If a sensory nerve gets trapped or compressed for a long period of time, the blood supply to the nerve can be cut off.  Vascular. Many blood vessel diseases can cause neuropathic pain by decreasing blood supply and oxygen to nerves.  Autoimmune. This type of  pain results from diseases in which the body's defense system mistakenly attacks sensory nerves. Examples of autoimmune diseases that can cause neuropathic pain include lupus and multiple sclerosis.  Infectious. Many types of viral infections can damage sensory nerves and cause pain. Shingles infection is a common cause of this type of pain.  Inherited. Neuropathic pain can be a symptom of many diseases that are passed down through families (genetic). SIGNS AND SYMPTOMS  The main symptom is pain. Neuropathic pain is often described as:  Burning.  Shock-like.  Stinging.  Hot or cold.  Itching. DIAGNOSIS  No single test can diagnose neuropathic pain. Your health care provider will do a physical exam and ask you about your pain. You may use a pain scale to describe how bad your pain is. You may also have tests to see if you have a high sensitivity to pain and to help find the cause and location of any sensory nerve damage. These tests may include:  Imaging studies, such as:  X-rays.  CT scan.  MRI.  Nerve conduction studies to test how well nerve signals travel through your sensory nerves (electrodiagnostic testing).  Stimulating your sensory nerves through electrodes on your skin and measuring the response in your spinal cord and brain (somatosensory evoked potentials). TREATMENT  Treatment for neuropathic pain may change over time. You may need to try different treatment options or a combination of treatments. Some options include:  Over-the-counter pain relievers.  Prescription medicines. Some medicines used to treat other conditions may also help neuropathic pain. These   include medicines to:  Control seizures (anticonvulsants).  Relieve depression (antidepressants).  Prescription-strength pain relievers (narcotics). These are usually used when other pain relievers do not help.  Transcutaneous nerve stimulation (TENS). This uses electrical currents to block painful nerve  signals. The treatment is painless.  Topical and local anesthetics. These are medicines that numb the nerves. They can be injected as a nerve block or applied to the skin.  Alternative treatments, such as:  Acupuncture.  Meditation.  Massage.  Physical therapy.  Pain management programs.  Counseling. HOME CARE INSTRUCTIONS  Learn as much as you can about your condition.  Take medicines only as directed by your health care provider.  Work closely with all your health care providers to find what works best for you.  Have a good support system at home.  Consider joining a chronic pain support group. SEEK MEDICAL CARE IF:  Your pain treatments are not helping.  You are having side effects from your medicines.  You are struggling with fatigue, mood changes, depression, or anxiety.   This information is not intended to replace advice given to you by your health care provider. Make sure you discuss any questions you have with your health care provider.   Document Released: 09/12/2004 Document Revised: 01/06/2015 Document Reviewed: 05/26/2014 Elsevier Interactive Patient Education 2016 [employer]  

## 2015-10-05 NOTE — Assessment & Plan Note (Signed)
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 Pt is having trouble sleeping due to pain and sensations. Gabapentin not helpful. Probable causes- RA, vascular concerns, B12 deficiency, A1c elevated. Will obtain A1c and B12 levels today. Weaning off of Gabapentin and onto Lyrica. Will follow up in 2 weeks

## 2015-10-05 NOTE — Progress Notes (Signed)
Pre visit review using our clinic review tool, if applicable. No additional management support is needed unless otherwise documented below in the visit note. 

## 2015-10-05 NOTE — Progress Notes (Signed)
Patient ID: Brandy Armstrong, female    DOB: [birth year]  Age: 69 y.o. MRN: [id number]  CC: Foot Pain   HPI Brandy Armstrong presents for CC of foot pain x 1 month or so.  1) Bilateral feet- tingling, stinging, feels like paper on them, trouble sleeping at night, taking Gabapentin, but not helpful.   1 Gabapentin this morning 3 at night sometimes.   2) Seeing Podiatry at [facility] at the end of this month. Toe nails deformed   History Brandy Armstrong has a past medical history of Rheumatoid arthritis; GERD (gastroesophageal reflux disease); Atrial fibrillation; Urinary incontinence; Colon polyps; Depression with anxiety; Barretts esophagus; Rectal fistula; Vitamin D deficiency; Chest pain; COPD (chronic obstructive pulmonary disease); and COPD (chronic obstructive pulmonary disease).   She has past surgical history that includes Cholecystectomy (1987); Abdominal hysterectomy (1992); oophrectomy (Bilateral, 1992); Cardiac electrophysiology study and ablation (2013); Esophagogastroduodenoscopy (N/A, 05/06/2015); and Colonoscopy (N/A, 05/08/2015).   Her family history includes Cancer (age of onset: 35) in her sister; Cancer (age of onset: 42) in her father; Cancer (age of onset: 71) in her mother; Depression in her mother; Diabetes in her brother.She reports that she quit smoking about 3 years ago. She has never used smokeless tobacco. She reports that she does not drink alcohol or use illicit drugs.  Outpatient Prescriptions Prior to Visit  Medication Sig Dispense Refill  . amiodarone (PACERONE) 200 MG tablet Take 1 tablet (200 mg total) by mouth 2 (two) times daily. (Patient taking differently: Take 200 mg by mouth 1 day or 1 dose. ) 60 tablet 1  . budesonide-formoterol (SYMBICORT) 160-4.5 MCG/ACT inhaler Inhale 2 puffs into the lungs 2 (two) times daily.    . cholestyramine (QUESTRAN) 4 G packet Take 1 packet by mouth as needed.    . clonazePAM (KLONOPIN) 0.5 MG tablet Take 1 tablet (0.5 mg total) by mouth 3 (three)  times daily as needed for anxiety. 90 tablet 1  . cyclobenzaprine (FLEXERIL) 10 MG tablet TAKE 1 TABLET (10 MG TOTAL) BY MOUTH AT BEDTIME. 30 tablet 0  . ferrous sulfate 325 (65 FE) MG tablet Take 1 tablet (325 mg total) by mouth daily with breakfast. 1 tablet 3  . hydroxychloroquine (PLAQUENIL) 200 MG tablet Take 200 mg by mouth 2 (two) times daily.    [name] leflunomide (ARAVA) 20 MG tablet Take 20 mg by mouth daily.    [name] levalbuterol (XOPENEX HFA) 45 MCG/ACT inhaler Inhale 2 puffs into the lungs every 4 (four) hours as needed for wheezing.    . metoprolol succinate (TOPROL-XL) 25 MG 24 hr tablet   6  . Respiratory Therapy Supplies (FLUTTER) DEVI Use as directed. 1 each 0  . tiotropium (SPIRIVA) 18 MCG inhalation capsule Place 18 mcg into inhaler and inhale daily.    [name] escitalopram (LEXAPRO) 20 MG tablet TAKE 1 TABLET BY MOUTH DAILY 30 tablet 2  . escitalopram (LEXAPRO) 20 MG tablet TAKE 1 TABLET BY MOUTH DAILY 30 tablet 2  . gabapentin (NEURONTIN) 300 MG capsule TAKE 1 CAPSULE (300 MG TOTAL) BY MOUTH 3 (THREE) TIMES DAILY. 90 capsule 1  . gabapentin (NEURONTIN) 300 MG capsule TAKE ONE CAPSULE BY MOUTH AT BEDTIME 30 capsule 1  . ibuprofen (ADVIL,MOTRIN) 800 MG tablet Take 1 tablet (800 mg total) by mouth every 8 (eight) hours as needed for moderate pain. 15 tablet 0  . magnesium oxide (MAG-OX) 400 (241.3 MG) MG tablet Take 1 tablet (400 mg total) by mouth daily. 10 tablet 0  . predniSONE (  DELTASONE) 20 MG tablet Take 1 tablet (20 mg total) by mouth daily with breakfast. 5 tablet 0  . sulfamethoxazole-trimethoprim (BACTRIM DS,SEPTRA DS) 800-160 MG tablet Take 1 tablet by mouth 2 (two) times daily. 20 tablet 0  . HYDROcodone-acetaminophen (NORCO/VICODIN) 5-325 MG tablet Take 1 tablet by mouth every 4 (four) hours as needed for moderate pain. (Patient not taking: Reported on 10/05/2015) 20 tablet 0   No facility-administered medications prior to visit.    ROS Review of Systems  Constitutional:  Negative for fever, chills, diaphoresis and fatigue.  Respiratory: Negative for chest tightness, shortness of breath and wheezing.   Cardiovascular: Negative for chest pain, palpitations and leg swelling.  Gastrointestinal: Negative for nausea, vomiting and diarrhea.  Musculoskeletal: Positive for back pain.  Neurological: Positive for numbness. Negative for dizziness, weakness and headaches.    Objective:  BP 100/68 mmHg  Pulse 76  Temp(Src) 98.2 F (36.8 C)  Resp 14  Ht 5\' 6"  (1.676 m)  SpO2 93%  Physical Exam  Constitutional: She is oriented to person, place, and time. She appears well-developed and well-nourished. No distress.  HENT:  Head: Normocephalic and atraumatic.  Right Ear: External ear normal.  Left Ear: External ear normal.  Neurological: She is alert and oriented to person, place, and time. No cranial nerve deficit. She exhibits normal muscle tone. Coordination normal.  Decreased sensation of bottom of feet.   Skin: Skin is warm and dry. No rash noted. She is not diaphoretic.  Psychiatric: She has a normal mood and affect. Her behavior is normal. Judgment and thought content normal.   Assessment & Plan:   Brandy Armstrong was seen today for foot pain.  Diagnoses and all orders for this visit:  Screening for diabetes mellitus -     HgB A1c  Neuropathy (HCC) -     B12  Other orders -     pregabalin (LYRICA) 50 MG capsule; Take 1 capsule (50 mg total) by mouth 3 (three) times daily.  I have discontinued Brandy Armstrong's magnesium oxide, escitalopram, predniSONE, gabapentin, ibuprofen, escitalopram, gabapentin, HYDROcodone-acetaminophen, and sulfamethoxazole-trimethoprim. I am also having her start on pregabalin. Additionally, I am having her maintain her cholestyramine, budesonide-formoterol, levalbuterol, hydroxychloroquine, leflunomide, metoprolol succinate, tiotropium, amiodarone, ferrous sulfate, FLUTTER, clonazePAM, cyclobenzaprine, and Apixaban (ELIQUIS PO).  Meds  ordered this encounter  Medications  . Apixaban (ELIQUIS PO)    Sig: Take by mouth 2 (two) times daily.  . pregabalin (LYRICA) 50 MG capsule    Sig: Take 1 capsule (50 mg total) by mouth 3 (three) times daily.    Dispense:  90 capsule    Refill:  0    Order Specific Question:  Supervising Provider    Answer:  Crecencio Mc [[id number]]     Follow-up: Return in about 2 weeks (around 10/19/2015) for Neuropathy.

## 2015-10-10 ENCOUNTER — Telehealth: Payer: Self-pay | Admitting: Cardiovascular Disease

## 2015-10-10 NOTE — Telephone Encounter (Signed)
Patient  not interested in fu unless needed in future no problems now and in rhythm for a while will delete recall

## 2015-10-20 ENCOUNTER — Ambulatory Visit (INDEPENDENT_AMBULATORY_CARE_PROVIDER_SITE_OTHER): Payer: Medicare Other | Admitting: Nurse Practitioner

## 2015-10-20 ENCOUNTER — Encounter: Payer: Self-pay | Admitting: Nurse Practitioner

## 2015-10-20 VITALS — BP 100/68 | HR 81 | Temp 97.6°F | Resp 14 | Ht 66.0 in | Wt 203.0 lb

## 2015-10-20 DIAGNOSIS — G629 Polyneuropathy, unspecified: Secondary | ICD-10-CM | POA: Diagnosis not present

## 2015-10-20 DIAGNOSIS — Z79899 Other long term (current) drug therapy: Secondary | ICD-10-CM | POA: Diagnosis not present

## 2015-10-20 DIAGNOSIS — Z23 Encounter for immunization: Secondary | ICD-10-CM | POA: Diagnosis not present

## 2015-10-20 MED ORDER — PREGABALIN 100 MG PO CAPS: 100.0000 mg | ORAL_CAPSULE | Freq: Three times a day (TID) | ORAL | Status: DC

## 2015-10-20 NOTE — Patient Instructions (Signed)
Stretch daily, try the low carb diet, and the new 100 mg Lyrica three times daily.   Thank you for getting your flu vaccine today.

## 2015-10-20 NOTE — Progress Notes (Signed)
Pre visit review using our clinic review tool, if applicable. No additional management support is needed unless otherwise documented below in the visit note. 

## 2015-10-20 NOTE — Progress Notes (Signed)
Patient ID: [name], female    DOB: 1946-09-07  Age: 68 y.o. MRN: [id number]  CC: Follow-up   HPI Brandy Armstrong presents for CC of medication management.   1) Lyrica 50 mg three times daily Stopped Gabapentin   Wt Readings from Last 3 Encounters:  10/20/15 203 lb (92.08 kg)  09/28/15 197 lb (89.359 kg)  08/15/15 198 lb (89.812 kg)     History Brandy Armstrong has a past medical history of Rheumatoid arthritis ([place]); GERD (gastroesophageal reflux disease); Atrial fibrillation ([place]); Urinary incontinence; Colon polyps; Depression with anxiety; Barretts esophagus; Rectal fistula; Vitamin D deficiency; Chest pain; COPD (chronic obstructive pulmonary disease) ([place]); and COPD (chronic obstructive pulmonary disease) ([place]).   She has past surgical history that includes Cholecystectomy (1987); Abdominal hysterectomy (1992); oophrectomy (Bilateral, 1992); Cardiac electrophysiology study and ablation (2013); Esophagogastroduodenoscopy (N/A, 05/06/2015); and Colonoscopy (N/A, 05/08/2015).   Her family history includes Cancer (age of onset: 64) in her sister; Cancer (age of onset: 9) in her father; Cancer (age of onset: 63) in her mother; Depression in her mother; Diabetes in her brother.She reports that she quit smoking about 3 years ago. She has never used smokeless tobacco. She reports that she does not drink alcohol or use illicit drugs.  Outpatient Prescriptions Prior to Visit  Medication Sig Dispense Refill  . amiodarone (PACERONE) 200 MG tablet Take 1 tablet (200 mg total) by mouth 2 (two) times daily. (Patient taking differently: Take 200 mg by mouth daily. ) 60 tablet 1  . Apixaban (ELIQUIS PO) Take by mouth 2 (two) times daily.    . cholestyramine (QUESTRAN) 4 G packet Take 1 packet by mouth as needed.    . clonazePAM (KLONOPIN) 0.5 MG tablet Take 1 tablet (0.5 mg total) by mouth 3 (three) times daily as needed for anxiety. 90 tablet 1  . cyclobenzaprine (FLEXERIL) 10 MG tablet TAKE 1 TABLET (10 MG  TOTAL) BY MOUTH AT BEDTIME. 30 tablet 0  . ferrous sulfate 325 (65 FE) MG tablet Take 1 tablet (325 mg total) by mouth daily with breakfast. 1 tablet 3  . hydroxychloroquine (PLAQUENIL) 200 MG tablet Take 200 mg by mouth 2 (two) times daily.    [name] leflunomide (ARAVA) 20 MG tablet Take 20 mg by mouth daily.    [name] levalbuterol (XOPENEX HFA) 45 MCG/ACT inhaler Inhale 2 puffs into the lungs every 4 (four) hours as needed for wheezing.    . metoprolol succinate (TOPROL-XL) 25 MG 24 hr tablet   6  . Respiratory Therapy Supplies (FLUTTER) DEVI Use as directed. 1 each 0  . tiotropium (SPIRIVA) 18 MCG inhalation capsule Place 18 mcg into inhaler and inhale daily.    . budesonide-formoterol (SYMBICORT) 160-4.5 MCG/ACT inhaler Inhale 2 puffs into the lungs 2 (two) times daily.    . pregabalin (LYRICA) 50 MG capsule Take 1 capsule (50 mg total) by mouth 3 (three) times daily. 90 capsule 0   No facility-administered medications prior to visit.    ROS Review of Systems  Constitutional: Negative for fever, chills, diaphoresis and fatigue.  Respiratory: Negative for chest tightness, shortness of breath and wheezing.   Cardiovascular: Negative for chest pain, palpitations and leg swelling.  Gastrointestinal: Negative for nausea, vomiting and diarrhea.  Skin: Negative for rash.  Neurological: Positive for numbness. Negative for dizziness, weakness and headaches.       Feet bilaterally  Psychiatric/Behavioral: Negative for suicidal ideas and sleep disturbance. The patient is not nervous/anxious.     Objective:  BP 100/68  mmHg  Pulse 81  Temp(Src) 97.6 F (36.4 C)  Resp 14  Ht 5\' 6"  (1.676 m)  Wt 203 lb (92.08 kg)  BMI 32.78 kg/m2  SpO2 96%  Physical Exam  Constitutional: She is oriented to person, place, and time. She appears well-developed and well-nourished. No distress.  HENT:  Head: Normocephalic and atraumatic.  Right Ear: External ear normal.  Left Ear: External ear normal.  Neurological:  She is alert and oriented to person, place, and time. No cranial nerve deficit. She exhibits normal muscle tone. Coordination normal.  Skin: Skin is warm and dry. No rash noted. She is not diaphoretic.  Psychiatric: She has a normal mood and affect. Her behavior is normal. Judgment and thought content normal.   Assessment & Plan:   Brandy Armstrong was seen today for follow-up.  Diagnoses and all orders for this visit:  Encounter for immunization  Neuropathy ([place])  Other orders -     pregabalin (LYRICA) 100 MG capsule; Take 1 capsule (100 mg total) by mouth 3 (three) times daily. -     Flu Vaccine QUAD 36+ mos IM   I have discontinued Brandy Armstrong's budesonide-formoterol and pregabalin. I am also having her start on pregabalin. Additionally, I am having her maintain her cholestyramine, levalbuterol, hydroxychloroquine, leflunomide, metoprolol succinate, tiotropium, amiodarone, ferrous sulfate, FLUTTER, clonazePAM, cyclobenzaprine, Apixaban (ELIQUIS PO), b complex vitamins, and cholecalciferol.  Meds ordered this encounter  Medications  . b complex vitamins tablet    Sig: Take 1 tablet by mouth daily.  . cholecalciferol (VITAMIN D) 400 UNITS TABS tablet    Sig: Take 400 Units by mouth.  . pregabalin (LYRICA) 100 MG capsule    Sig: Take 1 capsule (100 mg total) by mouth 3 (three) times daily.    Dispense:  90 capsule    Refill:  0    Order Specific Question:  Supervising Provider    Answer:  Crecencio Mc [[id number]]     Follow-up: Return in about 4 weeks (around 11/17/2015) for Neuropathy.

## 2015-10-23 DIAGNOSIS — B351 Tinea unguium: Secondary | ICD-10-CM | POA: Diagnosis not present

## 2015-10-23 DIAGNOSIS — G609 Hereditary and idiopathic neuropathy, unspecified: Secondary | ICD-10-CM | POA: Diagnosis not present

## 2015-10-23 DIAGNOSIS — S92911S Unspecified fracture of right toe(s), sequela: Secondary | ICD-10-CM | POA: Diagnosis not present

## 2015-10-27 ENCOUNTER — Institutional Professional Consult (permissible substitution): Payer: Medicare Other | Admitting: Internal Medicine

## 2015-11-03 ENCOUNTER — Telehealth: Payer: Self-pay | Admitting: *Deleted

## 2015-11-03 NOTE — Telephone Encounter (Signed)
Patient has requested a Rx for Cheratussin. Patient stated her cough is continuous.

## 2015-11-05 ENCOUNTER — Encounter: Payer: Self-pay | Admitting: Nurse Practitioner

## 2015-11-05 NOTE — Assessment & Plan Note (Addendum)
Patient reports she is stopping gabapentin, but take it longer than requested. We will switch over to Lyrica 100 mg 3 times daily and completely stop the gabapentin. Patient is agreeable to this and understands risks and benefits. Follow-up in one month

## 2015-11-06 NOTE — Telephone Encounter (Signed)
Was already filled on Friday by call to the pharmacy. Thanks!

## 2015-11-06 NOTE — Telephone Encounter (Signed)
Please advise, Looks like the patient had received a form of this back in June for her cough.

## 2015-11-07 ENCOUNTER — Emergency Department: Payer: Medicare Other

## 2015-11-07 ENCOUNTER — Emergency Department
Admission: EM | Admit: 2015-11-07 | Discharge: 2015-11-07 | Disposition: A | Discharge status: Home / Self Care | Payer: Medicare Other | Attending: Emergency Medicine | Admitting: Emergency Medicine

## 2015-11-07 ENCOUNTER — Encounter: Payer: Self-pay | Admitting: Medical Oncology

## 2015-11-07 ENCOUNTER — Institutional Professional Consult (permissible substitution): Payer: Medicare Other | Admitting: Internal Medicine

## 2015-11-07 DIAGNOSIS — Y9289 Other specified places as the place of occurrence of the external cause: Secondary | ICD-10-CM | POA: Insufficient documentation

## 2015-11-07 DIAGNOSIS — Z7951 Long term (current) use of inhaled steroids: Secondary | ICD-10-CM | POA: Insufficient documentation

## 2015-11-07 DIAGNOSIS — Z87891 Personal history of nicotine dependence: Secondary | ICD-10-CM | POA: Diagnosis not present

## 2015-11-07 DIAGNOSIS — Y998 Other external cause status: Secondary | ICD-10-CM | POA: Diagnosis not present

## 2015-11-07 DIAGNOSIS — Z7902 Long term (current) use of antithrombotics/antiplatelets: Secondary | ICD-10-CM | POA: Insufficient documentation

## 2015-11-07 DIAGNOSIS — S8001XA Contusion of right knee, initial encounter: Secondary | ICD-10-CM

## 2015-11-07 DIAGNOSIS — S0990XA Unspecified injury of head, initial encounter: Secondary | ICD-10-CM | POA: Insufficient documentation

## 2015-11-07 DIAGNOSIS — W228XXA Striking against or struck by other objects, initial encounter: Secondary | ICD-10-CM | POA: Diagnosis not present

## 2015-11-07 DIAGNOSIS — M79601 Pain in right arm: Secondary | ICD-10-CM | POA: Diagnosis not present

## 2015-11-07 DIAGNOSIS — W19XXXA Unspecified fall, initial encounter: Secondary | ICD-10-CM | POA: Diagnosis not present

## 2015-11-07 DIAGNOSIS — Y9389 Activity, other specified: Secondary | ICD-10-CM | POA: Insufficient documentation

## 2015-11-07 DIAGNOSIS — Z91040 Latex allergy status: Secondary | ICD-10-CM | POA: Insufficient documentation

## 2015-11-07 DIAGNOSIS — M25561 Pain in right knee: Secondary | ICD-10-CM | POA: Diagnosis not present

## 2015-11-07 DIAGNOSIS — M7989 Other specified soft tissue disorders: Secondary | ICD-10-CM | POA: Diagnosis not present

## 2015-11-07 DIAGNOSIS — Z79899 Other long term (current) drug therapy: Secondary | ICD-10-CM | POA: Diagnosis not present

## 2015-11-07 DIAGNOSIS — S41112A Laceration without foreign body of left upper arm, initial encounter: Secondary | ICD-10-CM | POA: Diagnosis not present

## 2015-11-07 DIAGNOSIS — S59911A Unspecified injury of right forearm, initial encounter: Secondary | ICD-10-CM | POA: Diagnosis not present

## 2015-11-07 DIAGNOSIS — S60211A Contusion of right wrist, initial encounter: Secondary | ICD-10-CM | POA: Diagnosis not present

## 2015-11-07 DIAGNOSIS — S8991XA Unspecified injury of right lower leg, initial encounter: Secondary | ICD-10-CM | POA: Diagnosis present

## 2015-11-07 MED ORDER — BACITRACIN ZINC 500 UNIT/GM EX OINT
TOPICAL_OINTMENT | Freq: Two times a day (BID) | CUTANEOUS | Status: DC
  Administered 2015-11-07: 1 via TOPICAL

## 2015-11-07 MED ORDER — HYDROCODONE-ACETAMINOPHEN 5-325 MG PO TABS: 2.0000 | ORAL_TABLET | Freq: Four times a day (QID) | ORAL | Status: DC | PRN

## 2015-11-07 MED ORDER — BACITRACIN ZINC 500 UNIT/GM EX OINT
TOPICAL_OINTMENT | CUTANEOUS | Status: AC
  Administered 2015-11-07: 1 via TOPICAL
  Filled 2015-11-07: qty 0.9

## 2015-11-07 NOTE — ED Provider Notes (Signed)
[facility] Emergency Department Provider Note  ____________________________________________   I have reviewed the triage vital signs and the nursing notes.   HISTORY  Chief Complaint Fall and Knee Injury    HPI Brandy Armstrong is a 69 y.o. female on Eliquis states she had a non-syncopal fall today. She tripped. Did not lose consciousness. Did not hit her head. She fell on outstretched bilateral arms. She has a tiny skin tear to her right upper arm and a bruise to her right wrist as well as bruising to the right knee. She is able to bear weight however. She has had no numbness no weakness. She had no subsequent or antecedent chest pain shortness of breath nausea vomiting diarrhea or lightheadedness and she feels completely at her baseline.  Past Medical History  Diagnosis Date  . Rheumatoid arthritis ([place])     On methotrexate and orencia  . GERD (gastroesophageal reflux disease)   . Atrial fibrillation [facility])     a. s/p ablation 01/2012 in [facility] by Dr. Boyd Kerbs;  b. On sotalol & Xarelto;  c. 02/2014 Echo: EF 50-55%, mild conc LVH, nl LA size/structure;  d. Recurrent afib 8/15 & 08/29/2014.  [name] Urinary incontinence   . Colon polyps   . Depression with anxiety   . Barretts esophagus   . Rectal fistula   . Vitamin D deficiency   . Chest pain     a. 02/2014 Myoview: Ef 50%, no ischemia.  [name] COPD (chronic obstructive pulmonary disease) ([place])   . COPD (chronic obstructive pulmonary disease) [facility])     Patient Active Problem List   Diagnosis Date Noted  . Neuropathy ([place]) 10/05/2015  . Midline thoracic back pain 08/10/2015  . Tremors of nervous system 06/18/2015  . Acute blood loss anemia 05/24/2015  . Hypokalemia 05/24/2015  . GI bleed due to NSAIDs 05/04/2015  . Medicare annual wellness visit, subsequent 01/26/2015  . Hospital discharge follow-up 01/13/2015  . Atrial fibrillation ([place]) 03/11/2014  . COPD (chronic obstructive pulmonary disease) ([place])  03/11/2014  . Neck pain on left side 03/02/2014  . Left ankle pain 09/17/2013  . Fatigue 08/18/2013  . Barrett's esophagus 08/18/2013  . Chronic diarrhea 08/18/2013  . Numbness and tingling 08/18/2013  . Rectal fistula 08/18/2013    Past Surgical History  Procedure Laterality Date  . Cholecystectomy  1987  . Abdominal hysterectomy  1992  . Oophrectomy Bilateral 1992  . Cardiac electrophysiology study and ablation  2013  . Esophagogastroduodenoscopy N/A 05/06/2015    Procedure: ESOPHAGOGASTRODUODENOSCOPY (EGD);  Surgeon: Lollie Sails, MD;  Location[facility] ENDOSCOPY;  Service: Endoscopy;  Laterality: N/A;  . Colonoscopy N/A 05/08/2015    Procedure: COLONOSCOPY;  Surgeon: Manya Silvas, MD;  Location: [facility] ENDOSCOPY;  Service: Endoscopy;  Laterality: N/A;    Current Outpatient Rx  Name  Route  Sig  Dispense  Refill  . amiodarone (PACERONE) 200 MG tablet   Oral   Take 1 tablet (200 mg total) by mouth 2 (two) times daily. Patient taking differently: Take 200 mg by mouth daily.    60 tablet   1   . Apixaban (ELIQUIS PO)   Oral   Take by mouth 2 (two) times daily.         [name] b complex vitamins tablet   Oral   Take 1 tablet by mouth daily.         . cholecalciferol (VITAMIN D) 400 UNITS TABS tablet   Oral   Take 400 Units  by mouth.         . cholestyramine (QUESTRAN) 4 G packet   Oral   Take 1 packet by mouth as needed.         . clonazePAM (KLONOPIN) 0.5 MG tablet   Oral   Take 1 tablet (0.5 mg total) by mouth 3 (three) times daily as needed for anxiety.   90 tablet   1   . cyclobenzaprine (FLEXERIL) 10 MG tablet      TAKE 1 TABLET (10 MG TOTAL) BY MOUTH AT BEDTIME.   30 tablet   0   . ferrous sulfate 325 (65 FE) MG tablet   Oral   Take 1 tablet (325 mg total) by mouth daily with breakfast.   1 tablet   3   . hydroxychloroquine (PLAQUENIL) 200 MG tablet   Oral   Take 200 mg by mouth 2 (two) times daily.         [name] leflunomide (ARAVA) 20 MG  tablet   Oral   Take 20 mg by mouth daily.         [name] levalbuterol (XOPENEX HFA) 45 MCG/ACT inhaler   Inhalation   Inhale 2 puffs into the lungs every 4 (four) hours as needed for wheezing.         . metoprolol succinate (TOPROL-XL) 25 MG 24 hr tablet            6   . pregabalin (LYRICA) 100 MG capsule   Oral   Take 1 capsule (100 mg total) by mouth 3 (three) times daily.   90 capsule   0   . Respiratory Therapy Supplies (FLUTTER) DEVI      Use as directed.   1 each   0   . tiotropium (SPIRIVA) 18 MCG inhalation capsule   Inhalation   Place 18 mcg into inhaler and inhale daily.           Allergies Latex  Family History  Problem Relation Age of Onset  . Depression Mother   . Cancer Mother 67    pancreatic cancer  . Cancer Father 49    colon cancer  . Cancer Sister 30    breast cancer  . Diabetes Brother     Social History Social History  Substance Use Topics  . Smoking status: Former Smoker -- 1.00 packs/day for 40 years    Quit date: 12/16/2011  . Smokeless tobacco: Never Used  . Alcohol Use: No     Comment: Occasionally has a drink    Review of Systems Constitutional: No fever/chills Eyes: No visual changes. ENT: No sore throat. No stiff neck no neck pain Cardiovascular: Denies chest pain. Respiratory: Denies shortness of breath. Gastrointestinal:   no vomiting.  No diarrhea.  No constipation. Genitourinary: Negative for dysuria. Musculoskeletal: Negative lower extremity swelling Skin: Negative for rash. Neurological: Negative for headaches, focal weakness or numbness. 10-point ROS otherwise negative.  ____________________________________________   PHYSICAL EXAM:  VITAL SIGNS: ED Triage Vitals  Enc Vitals Group     BP 11/07/15 1253 145/68 mmHg     Pulse Rate 11/07/15 1253 75     Resp 11/07/15 1253 20     Temp 11/07/15 1253 97.3 F (36.3 C)     Temp Source 11/07/15 1253 Oral     SpO2 11/07/15 1253 97 %     Weight 11/07/15 1253  203 lb (92.08 kg)     Height 11/07/15 1253 5\' 6"  (1.676 m)     Head Cir --  Peak Flow --      Pain Score 11/07/15 1254 5     Pain Loc --      Pain Edu? --      Excl. in [place]? --     Constitutional: Alert and oriented. Well appearing and in no acute distress. Eyes: Conjunctivae are normal. PERRL. EOMI. Head: Atraumatic. Nose: No congestion/rhinnorhea. Mouth/Throat: Mucous membranes are moist.  Oropharynx non-erythematous. Neck: No stridor.   Nontender with no meningismus Cardiovascular: Normal rate, regular rhythm. Grossly normal heart sounds.  Good peripheral circulation. Respiratory: Normal respiratory effort.  No retractions. Lungs CTAB. Abdominal: Soft and nontender. No distention. No guarding no rebound Back:  There is no focal tenderness or step off there is no midline tenderness there are no lesions noted. there is no CVA tenderness Musculoskeletal: There is bruising to the right knee but she has full range of motion with no evidence of significant effusion, there is no ankle or hip discomfort bilaterally. Left knee is normal. There is some bruising to the right wrist but she has full painless range of motion in all areas of the wrist with no evidence of fracture. Grip strength is normal. X-ray is noted but she has no elbow discomfort or pain I can fully range both elbows with no evidence of fracture. The patient has a tiny skin tear to the left upper extremity, partially 1 inch. We will treat this. Neurologic:  Normal speech and language. No gross focal neurologic deficits are appreciated.  Skin:  Skin is warm, dry and intact. See above Psychiatric: Mood and affect are normal. Speech and behavior are normal.  ____________________________________________   LABS (all labs ordered are listed, but only abnormal results are displayed)  Labs Reviewed - No data to display ____________________________________________  EKG  I personally interpreted any EKGs ordered by me or  triage  ____________________________________________  RADIOLOGY  I reviewed any imaging ordered by me or triage that were performed during my shift ____________________________________________   PROCEDURES  Procedure(s) performed: None  Critical Care performed: None  ____________________________________________   INITIAL IMPRESSION / ASSESSMENT AND PLAN / ED COURSE  Pertinent labs & imaging results that were available during my care of the patient were reviewed by me and considered in my medical decision making (see chart for details).  Because the patient is on blood thinners we did do a CT scan of the head which is negative. Patient has bruising to the right knee. I did offer a knee immobilizer but she refused. At this time there is no evidence of ligamentous instability. There is no laxity to varus or valgus stress. She has evidence for a with no difficulty using her walker which she does have baseline. X-rays noted that there is no evidence of acute elbow fracture and I do not think reimaging is necessary. She has minimal tenderness to the wrist with there is bruising but there is no evidence of fracture area the patient is at her baseline and eager to go home. She is asking me for Vicodin to take for pain which I do not think is unreasonable. She has had this before. He states of return precautions and follow-up has been given and understood. ____________________________________________   FINAL CLINICAL IMPRESSION(S) / ED DIAGNOSES  Final diagnoses:  None     Schuyler Amor, MD 11/07/15 1445

## 2015-11-07 NOTE — Discharge Instructions (Signed)

## 2015-11-07 NOTE — ED Notes (Signed)
Pt reports she tripped over corner of a desk and fell pta. Pt c/o rt knee pain, rt forearm pain and left elbow skin tear. Denies hitting head or LOC. Pt is on blood thinner. A/O x 4.

## 2015-11-07 NOTE — ED Notes (Signed)
Pt ambulated to toilet in treatment room with assistance and a walker.

## 2015-11-13 ENCOUNTER — Ambulatory Visit (INDEPENDENT_AMBULATORY_CARE_PROVIDER_SITE_OTHER): Payer: Medicare Other | Admitting: Nurse Practitioner

## 2015-11-13 ENCOUNTER — Encounter: Payer: Self-pay | Admitting: Nurse Practitioner

## 2015-11-13 VITALS — BP 102/66 | HR 90 | Temp 97.7°F | Resp 12 | Ht 66.0 in | Wt 206.2 lb

## 2015-11-13 DIAGNOSIS — Z09 Encounter for follow-up examination after completed treatment for conditions other than malignant neoplasm: Secondary | ICD-10-CM | POA: Diagnosis not present

## 2015-11-13 NOTE — Progress Notes (Signed)
Patient ID: Brandy Armstrong, female    DOB: 06/06/1946  Age: 69 y.o. MRN: [id number]  CC: Leg Swelling   HPI Brandy Armstrong presents for CC of fall on 11/07/15.   1) ED [facility] visit on 11/07/15  Non-syncopal fall onto her concrete floor, no head involvement, fell onto bilateral outstretched arms. Vicodin given, scans negative, sent home  Imaging: X-ray right knee- Soft tissue swelling, no fracture, OA changes noted laterally  X-ray right forearm- No significant findings, but recommended dedicated elbow x-rays if questions arise later  CT head w/out contrast- No significant findings other than sinus disease  Today: Right arm and right leg pain helped by vicodin given in ER.  Right arm- trouble turning door she reports- able to do so today,  Left arm- Bruise and cut is healing well   Nasal rinses- "Helps for a bit" she reports  History Brandy Armstrong has a past medical history of Rheumatoid arthritis ([place]); GERD (gastroesophageal reflux disease); Atrial fibrillation ([place]); Urinary incontinence; Colon polyps; Depression with anxiety; Barretts esophagus; Rectal fistula; Vitamin D deficiency; Chest pain; COPD (chronic obstructive pulmonary disease) ([place]); and COPD (chronic obstructive pulmonary disease) ([place]).   She has past surgical history that includes Cholecystectomy (1987); Abdominal hysterectomy (1992); oophrectomy (Bilateral, 1992); Cardiac electrophysiology study and ablation (2013); Esophagogastroduodenoscopy (N/A, 05/06/2015); and Colonoscopy (N/A, 05/08/2015).   Her family history includes Cancer (age of onset: 44) in her sister; Cancer (age of onset: 69) in her father; Cancer (age of onset: 69) in her mother; Depression in her mother; Diabetes in her brother.She reports that she quit smoking about 3 years ago. She has never used smokeless tobacco. She reports that she does not drink alcohol or use illicit drugs.  Outpatient Prescriptions Prior to Visit  Medication Sig Dispense Refill  . amiodarone  (PACERONE) 200 MG tablet Take 1 tablet (200 mg total) by mouth 2 (two) times daily. (Patient taking differently: Take 200 mg by mouth daily. ) 60 tablet 1  . Apixaban (ELIQUIS PO) Take by mouth 2 (two) times daily.    [name] b complex vitamins tablet Take 1 tablet by mouth daily.    . cholecalciferol (VITAMIN D) 400 UNITS TABS tablet Take 400 Units by mouth.    . cholestyramine (QUESTRAN) 4 G packet Take 1 packet by mouth as needed.    . clonazePAM (KLONOPIN) 0.5 MG tablet Take 1 tablet (0.5 mg total) by mouth 3 (three) times daily as needed for anxiety. 90 tablet 1  . cyclobenzaprine (FLEXERIL) 10 MG tablet TAKE 1 TABLET (10 MG TOTAL) BY MOUTH AT BEDTIME. 30 tablet 0  . ferrous sulfate 325 (65 FE) MG tablet Take 1 tablet (325 mg total) by mouth daily with breakfast. 1 tablet 3  . HYDROcodone-acetaminophen (NORCO) 5-325 MG tablet Take 2 tablets by mouth every 6 (six) hours as needed for moderate pain. 10 tablet 0  . hydroxychloroquine (PLAQUENIL) 200 MG tablet Take 200 mg by mouth 2 (two) times daily.    [name] leflunomide (ARAVA) 20 MG tablet Take 20 mg by mouth daily.    [name] levalbuterol (XOPENEX HFA) 45 MCG/ACT inhaler Inhale 2 puffs into the lungs every 4 (four) hours as needed for wheezing.    . metoprolol succinate (TOPROL-XL) 25 MG 24 hr tablet   6  . pregabalin (LYRICA) 100 MG capsule Take 1 capsule (100 mg total) by mouth 3 (three) times daily. 90 capsule 0  . Respiratory Therapy Supplies (FLUTTER) DEVI Use as directed. 1 each 0  . tiotropium (SPIRIVA)  18 MCG inhalation capsule Place 18 mcg into inhaler and inhale daily.     No facility-administered medications prior to visit.    ROS Review of Systems  Constitutional: Negative for fever, chills, diaphoresis and fatigue.  Respiratory: Positive for cough. Negative for chest tightness, shortness of breath and wheezing.   Cardiovascular: Negative for chest pain, palpitations and leg swelling.  Gastrointestinal: Negative for nausea, vomiting and  diarrhea.  Skin: Positive for color change. Negative for rash.       Ecchymosis of right lower extremity   Neurological: Negative for dizziness, weakness, numbness and headaches.  Psychiatric/Behavioral: The patient is not nervous/anxious.    Objective:  BP 102/66 mmHg  Pulse 90  Temp(Src) 97.7 F (36.5 C)  Resp 12  Ht 5\' 6"  (1.676 m)  Wt 206 lb 3.2 oz (93.532 kg)  BMI 33.30 kg/m2  SpO2 94%  Physical Exam  Constitutional: She is oriented to person, place, and time. She appears well-developed and well-nourished. No distress.  HENT:  Head: Normocephalic and atraumatic.  Right Ear: External ear normal.  Left Ear: External ear normal.  Cardiovascular: Normal rate, regular rhythm and normal heart sounds.  Exam reveals no gallop and no friction rub.   No murmur heard. Pulmonary/Chest: Effort normal and breath sounds normal. No respiratory distress. She has no wheezes. She has no rales. She exhibits no tenderness.  Musculoskeletal: Normal range of motion. She exhibits edema and tenderness.  5/5 grip strength bilaterally, mild tenderness to palpation of leg  Ecchymosis of right lower extremity   Neurological: She is alert and oriented to person, place, and time. No cranial nerve deficit. She exhibits normal muscle tone. Coordination normal.  Skin: Skin is warm and dry. No rash noted. She is not diaphoretic.     Bruising and edema of the right lower extremity (self report from pt of improvement), on blood thinners, slightly tender to palpation.   Psychiatric: She has a normal mood and affect. Her behavior is normal. Judgment and thought content normal.   Assessment & Plan:   Brandy Armstrong was seen today for leg swelling.  Diagnoses and all orders for this visit:  Hospital discharge follow-up   I am having Brandy Armstrong maintain her cholestyramine, levalbuterol, hydroxychloroquine, leflunomide, metoprolol succinate, tiotropium, amiodarone, ferrous sulfate, FLUTTER, clonazePAM, cyclobenzaprine,  Apixaban (ELIQUIS PO), b complex vitamins, cholecalciferol, pregabalin, and HYDROcodone-acetaminophen.  No orders of the defined types were placed in this encounter.     Follow-up: Return if symptoms worsen or fail to improve.

## 2015-11-13 NOTE — Assessment & Plan Note (Signed)
Continue blood thinners.  Looks okay, just needs time to resolve Pt able to bear weight, no gait issues Pain- Pt has 2 vicodin left, Cheratussin AC for cough, Lyrica for numbness/tingling, and flexeril and Klonopin. Nothing else will be prescribed at this time. She was told not to take any combo of this or add benadryl due to risk of respiratory depression.  Acetaminophen is okay in moderation, but not with vicodin, can start after finishing.   FU as needed

## 2015-11-13 NOTE — Patient Instructions (Addendum)
Post-nasal drip- Cheratussin AC 1 teaspoon at night or if resting, but DO NOT take with Vicodin because it increases your risk of respiratory depression leading to death. Take 6 hours or more apart!   DO NOT take benadryl with these medications take one or the other. DO NOT take Klonopin with these medications either. Choose one.   Mucinex plain (no D or DM) is helpful for thinning mucous and found over the counter.  Hematoma A hematoma is a collection of blood under the skin, in an organ, in a body space, in a joint space, or in other tissue. The blood can clot to form a lump that you can see and feel. The lump is often firm and may sometimes become sore and tender. Most hematomas get better in a few days to weeks. However, some hematomas may be serious and require medical care. Hematomas can range in size from very small to very large. CAUSES  A hematoma can be caused by a blunt or penetrating injury. It can also be caused by spontaneous leakage from a blood vessel under the skin. Spontaneous leakage from a blood vessel is more likely to occur in older people, especially those taking blood thinners. Sometimes, a hematoma can develop after certain medical procedures. SIGNS AND SYMPTOMS   A firm lump on the body.  Possible pain and tenderness in the area.  Bruising.Blue, dark blue, purple-red, or yellowish skin may appear at the site of the hematoma if the hematoma is close to the surface of the skin. For hematomas in deeper tissues or body spaces, the signs and symptoms may be subtle. For example, an intra-abdominal hematoma may cause abdominal pain, weakness, fainting, and shortness of breath. An intracranial hematoma may cause a headache or symptoms such as weakness, trouble speaking, or a change in consciousness. DIAGNOSIS  A hematoma can usually be diagnosed based on your medical history and a physical exam. Imaging tests may be needed if your health care provider suspects a hematoma in  deeper tissues or body spaces, such as the abdomen, head, or chest. These tests may include ultrasonography or a CT scan.  TREATMENT  Hematomas usually go away on their own over time. Rarely does the blood need to be drained out of the body. Large hematomas or those that may affect vital organs will sometimes need surgical drainage or monitoring. HOME CARE INSTRUCTIONS   Apply ice to the injured area:   Put ice in a plastic bag.   Place a towel between your skin and the bag.   Leave the ice on for 20 minutes, 2-3 times a day for the first 1 to 2 days.   After the first 2 days, switch to using warm compresses on the hematoma.   Elevate the injured area to help decrease pain and swelling. Wrapping the area with an elastic bandage may also be helpful. Compression helps to reduce swelling and promotes shrinking of the hematoma. Make sure the bandage is not wrapped too tight.   If your hematoma is on a lower extremity and is painful, crutches may be helpful for a couple days.   Only take over-the-counter or prescription medicines as directed by your health care provider. SEEK IMMEDIATE MEDICAL CARE IF:   You have increasing pain, or your pain is not controlled with medicine.   You have a fever.   You have worsening swelling or discoloration.   Your skin over the hematoma breaks or starts bleeding.   Your hematoma is in your chest  or abdomen and you have weakness, shortness of breath, or a change in consciousness.  Your hematoma is on your scalp (caused by a fall or injury) and you have a worsening headache or a change in alertness or consciousness. MAKE SURE YOU:   Understand these instructions.  Will watch your condition.  Will get help right away if you are not doing well or get worse.   This information is not intended to replace advice given to you by your health care provider. Make sure you discuss any questions you have with your health care provider.   Document  Released: 07/30/2004 Document Revised: 08/18/2013 Document Reviewed: 05/26/2013 Elsevier Interactive Patient Education [employer].

## 2015-11-13 NOTE — Progress Notes (Signed)
Pre visit review using our clinic review tool, if applicable. No additional management support is needed unless otherwise documented below in the visit note. 

## 2015-11-20 ENCOUNTER — Ambulatory Visit (INDEPENDENT_AMBULATORY_CARE_PROVIDER_SITE_OTHER): Payer: Medicare Other | Admitting: Internal Medicine

## 2015-11-20 ENCOUNTER — Encounter: Payer: Self-pay | Admitting: Internal Medicine

## 2015-11-20 ENCOUNTER — Telehealth: Payer: Self-pay

## 2015-11-20 VITALS — BP 118/62 | HR 77 | Ht 66.0 in | Wt 208.0 lb

## 2015-11-20 DIAGNOSIS — J209 Acute bronchitis, unspecified: Secondary | ICD-10-CM

## 2015-11-20 DIAGNOSIS — J309 Allergic rhinitis, unspecified: Secondary | ICD-10-CM

## 2015-11-20 MED ORDER — AZITHROMYCIN 500 MG PO TABS: 500.0000 mg | ORAL_TABLET | Freq: Every day | ORAL | Status: AC

## 2015-11-20 MED ORDER — FLUTICASONE FUROATE-VILANTEROL 100-25 MCG/INH IN AEPB: 1.0000 | INHALATION_SPRAY | Freq: Every day | RESPIRATORY_TRACT | Status: DC

## 2015-11-20 MED ORDER — GUAIFENESIN-CODEINE 100-10 MG/5ML PO SOLN: 5.0000 mL | ORAL | Status: DC | PRN

## 2015-11-20 MED ORDER — UMECLIDINIUM BROMIDE 62.5 MCG/INH IN AEPB: 1.0000 | INHALATION_SPRAY | Freq: Every day | RESPIRATORY_TRACT | Status: DC

## 2015-11-20 MED ORDER — FLUTICASONE FUROATE-VILANTEROL 100-25 MCG/INH IN AEPB: 1.0000 | INHALATION_SPRAY | Freq: Every day | RESPIRATORY_TRACT | Status: AC

## 2015-11-20 MED ORDER — UMECLIDINIUM-VILANTEROL 62.5-25 MCG/INH IN AEPB: 1.0000 | INHALATION_SPRAY | Freq: Every day | RESPIRATORY_TRACT | Status: DC

## 2015-11-20 MED ORDER — CETIRIZINE HCL 10 MG PO TABS: 10.0000 mg | ORAL_TABLET | Freq: Every day | ORAL | Status: DC

## 2015-11-20 MED ORDER — UMECLIDINIUM BROMIDE 62.5 MCG/INH IN AEPB: 1.0000 | INHALATION_SPRAY | Freq: Every day | RESPIRATORY_TRACT | Status: AC

## 2015-11-20 NOTE — Telephone Encounter (Signed)
LMOM for pt stating per KK that we will send Anoro to pharmacy. Also that if Anoro would not be covered to call her insurance to what would be covered in its place. Nothing further needed.

## 2015-11-20 NOTE — Telephone Encounter (Signed)
Called pharmacy and gave clarification for the ZPak. Also pharmacy states the [employer] didn't come back requesting a PA, just stated not covered. Please advise.

## 2015-11-20 NOTE — Patient Instructions (Addendum)
Chronic Obstructive Pulmonary Disease Chronic obstructive pulmonary disease (COPD) is a common lung condition in which airflow from the lungs is limited. COPD is a general term that can be used to describe many different lung problems that limit airflow, including both chronic bronchitis and emphysema. If you have COPD, your lung function will probably never return to normal, but there are measures you can take to improve lung function and make yourself feel better. CAUSES   Smoking (common).  Exposure to secondhand smoke.  Genetic problems.  Chronic inflammatory lung diseases or recurrent infections. SYMPTOMS  Shortness of breath, especially with physical activity.  Deep, persistent (chronic) cough with a large amount of thick mucus.  Wheezing.  Rapid breaths (tachypnea).  Gray or bluish discoloration (cyanosis) of the skin, especially in your fingers, toes, or lips.  Fatigue.  Weight loss.  Frequent infections or episodes when breathing symptoms become much worse (exacerbations).  Chest tightness. DIAGNOSIS Your health care provider will take a medical history and perform a physical examination to diagnose COPD. Additional tests for COPD may include:  Lung (pulmonary) function tests.  Chest X-ray.  CT scan.  Blood tests. TREATMENT  Treatment for COPD may include:  Inhaler and nebulizer medicines. These help manage the symptoms of COPD and make your breathing more comfortable.  Supplemental oxygen. Supplemental oxygen is only helpful if you have a low oxygen level in your blood.  Exercise and physical activity. These are beneficial for nearly all people with COPD.  Lung surgery or transplant.  Nutrition therapy to gain weight, if you are underweight.  Pulmonary rehabilitation. This may involve working with a team of health care providers and specialists, such as respiratory, occupational, and physical therapists. HOME CARE INSTRUCTIONS  Take all medicines  (inhaled or pills) as directed by your health care provider.  Avoid over-the-counter medicines or cough syrups that dry up your airway (such as antihistamines) and slow down the elimination of secretions unless instructed otherwise by your health care provider.  If you are a smoker, the most important thing that you can do is stop smoking. Continuing to smoke will cause further lung damage and breathing trouble. Ask your health care provider for help with quitting smoking. He or she can direct you to community resources or hospitals that provide support.  Avoid exposure to irritants such as smoke, chemicals, and fumes that aggravate your breathing.  Use oxygen therapy and pulmonary rehabilitation if directed by your health care provider. If you require home oxygen therapy, ask your health care provider whether you should purchase a pulse oximeter to measure your oxygen level at home.  Avoid contact with individuals who have a contagious illness.  Avoid extreme temperature and humidity changes.  Eat healthy foods. Eating smaller, more frequent meals and resting before meals may help you maintain your strength.  Stay active, but balance activity with periods of rest. Exercise and physical activity will help you maintain your ability to do things you want to do.  Preventing infection and hospitalization is very important when you have COPD. Make sure to receive all the vaccines your health care provider recommends, especially the pneumococcal and influenza vaccines. Ask your health care provider whether you need a pneumonia vaccine.  Learn and use relaxation techniques to manage stress.  Learn and use controlled breathing techniques as directed by your health care provider. Controlled breathing techniques include:  Pursed lip breathing. Start by breathing in (inhaling) through your nose for 1 second. Then, purse your lips as if you were   going to whistle and breathe out (exhale) through the  pursed lips for 2 seconds.  Diaphragmatic breathing. Start by putting one hand on your abdomen just above your waist. Inhale slowly through your nose. The hand on your abdomen should move out. Then purse your lips and exhale slowly. You should be able to feel the hand on your abdomen moving in as you exhale.  Learn and use controlled coughing to clear mucus from your lungs. Controlled coughing is a series of short, progressive coughs. The steps of controlled coughing are: 1. Lean your head slightly forward. 2. Breathe in deeply using diaphragmatic breathing. 3. Try to hold your breath for 3 seconds. 4. Keep your mouth slightly open while coughing twice. 5. Spit any mucus out into a tissue. 6. Rest and repeat the steps once or twice as needed. SEEK MEDICAL CARE IF:  You are coughing up more mucus than usual.  There is a change in the color or thickness of your mucus.  Your breathing is more labored than usual.  Your breathing is faster than usual. SEEK IMMEDIATE MEDICAL CARE IF:  You have shortness of breath while you are resting.  You have shortness of breath that prevents you from:  Being able to talk.  Performing your usual physical activities.  You have chest pain lasting longer than 5 minutes.  Your skin color is more cyanotic than usual.  You measure low oxygen saturations for longer than 5 minutes with a pulse oximeter. MAKE SURE YOU:  Understand these instructions.  Will watch your condition.  Will get help right away if you are not doing well or get worse.   This information is not intended to replace advice given to you by your health care provider. Make sure you discuss any questions you have with your health care provider.   Document Released: 09/25/2005 Document Revised: 01/06/2015 Document Reviewed: 08/12/2013 Elsevier Interactive Patient Education 2016 [place]  STOP SYMBICORT ZYRTEC DAILY LEVALBUTEROL AS NEEDED

## 2015-11-20 NOTE — Telephone Encounter (Signed)
LMOM for pt that we are going to do a PA for Incruse but to continue with the other meds prescribed at this time per [place].

## 2015-11-20 NOTE — Telephone Encounter (Signed)
Pt states that her insurance will not cover Incrusa. Please call.

## 2015-11-20 NOTE — Addendum Note (Signed)
Addended by: Maryanna Shape A on: 11/20/2015 10:08 AM   Modules accepted: Orders

## 2015-11-20 NOTE — Progress Notes (Signed)
   Subjective:    Patient ID: Brandy Armstrong, female    DOB: [birth year], 69 y.o.   MRN: [id number]  Synopsis: First seen by [facility] in 2015 for GOLD C COPD 03/24/2014 Simple Spirometry > [place] 66%, FEV1 1.12L (44% pred) 01/2014 CT angio chest reviewed> no PE, no scarring or fibrosis, motion artifact limited, but there is suggestion of mild centrilobular emphysema upper lobes bilaterally She previously smoked 1 pack a day for 40 plus years and quit 2012  CC: patient with intermittent wheezing and cough with some SOB  HPI Patient with wheezing, SOB and cough Has grade C COPD(moderate Fev1 44%) Has symptoms of allergic rhinitis She states that symbicort not helping anymore    Review of Systems  Constitutional: Negative for fever, chills and fatigue.  HENT: Negative for postnasal drip, rhinorrhea and sinus pressure.   Respiratory: Positive for cough, chest tightness, shortness of breath and wheezing.   Cardiovascular: Negative for chest pain, palpitations and leg swelling.       Objective:   Physical Exam Filed Vitals:   11/20/15 0858  BP: 118/62  Pulse: 77  Height: 5\' 6"  (1.676 m)  Weight: 208 lb (94.348 kg)  SpO2: 99%   RA  Gen: well appearing, no acute distress HEENT: NCAT, EOMi, OP clear,  PULM: NL effort, no wheezes. CV: RRR, no mgr, no JVD   May 2016 discharge summary reviewed per she was treated for an upper GI bleed next line  January 2016 chest x-ray report reviewed showing no infiltrate or lung abnormality    Assessment & Plan:   69 yo white female with Grade C COPD(moderate COPD) with acute mILD COPD exacerbation from acute bronchitis with allergic rhinitis  1.stop spiriva-start INCRUSE(long acting AC) 2.stop symbicort-start BREO(inhaled steroids and LABA) 3.start z pack 4.start zyrtec 5.tussin with codeine as needed 6.levalbuterol as needed  If symptoms get worse, I have advised patient to call office and will give prednisone therapy  Follow up in  3 months  The Patient requires high complexity decision making for assessment and support, frequent evaluation and titration of therapies.  Patient  satisfied with Plan of action and management. All questions answered  Corrin Parker, M.D.  [facility]  Medical Director [place] Director [facility] Cardio-Pulmonary Department

## 2015-11-22 ENCOUNTER — Other Ambulatory Visit: Payer: Self-pay | Admitting: Nurse Practitioner

## 2015-11-22 ENCOUNTER — Telehealth: Payer: Self-pay | Admitting: Nurse Practitioner

## 2015-11-22 DIAGNOSIS — G609 Hereditary and idiopathic neuropathy, unspecified: Secondary | ICD-10-CM

## 2015-11-22 DIAGNOSIS — G629 Polyneuropathy, unspecified: Secondary | ICD-10-CM

## 2015-11-22 MED ORDER — PREGABALIN 50 MG PO CAPS: 50.0000 mg | ORAL_CAPSULE | Freq: Three times a day (TID) | ORAL | Status: DC

## 2015-11-22 NOTE — Telephone Encounter (Signed)
Pt called about wanting to change her prescription pregabalin (LYRICA) 100 MG capsule to 50 mg three times a day. Pt states it made her gain weight so she wants to try it. [facility]acy is [id number], [place] - [address]. Pt would also like a referral to a neurologist in [place] or [place]. Not [facility]. Thank You!

## 2015-11-22 NOTE — Telephone Encounter (Signed)
Will place order. Thanks.

## 2015-11-27 ENCOUNTER — Telehealth: Payer: Self-pay | Admitting: *Deleted

## 2015-11-27 MED ORDER — UMECLIDINIUM BROMIDE 62.5 MCG/INH IN AEPB: 1.0000 | INHALATION_SPRAY | Freq: Every day | RESPIRATORY_TRACT | Status: AC

## 2015-11-27 NOTE — Telephone Encounter (Signed)
Pt has hx of Afib and states that the Vilanterol is causing palpatations. Can we just give her the Incruse only? Originally she was given [place] and Incruse and insurance denied Incruse so Dr. Mortimer Fries gave her Anoro which also has Vilanterol. I may be able to get a PA approved for the Incruse. Ia it ok to proceed with this PA and give only Incruse?

## 2015-11-27 NOTE — Telephone Encounter (Signed)
Per DS, can do PA for incruse.

## 2015-11-30 ENCOUNTER — Telehealth: Payer: Self-pay | Admitting: *Deleted

## 2015-11-30 NOTE — Telephone Encounter (Signed)
initiatited PA for Incruse thru [facility]. Key: [place] ID: [id number] Sent for review.  Pt can't take meds with Vilanterol due to it causing palpitations and she has a HX of A-Fib.

## 2015-12-05 ENCOUNTER — Telehealth: Payer: Self-pay | Admitting: *Deleted

## 2015-12-05 ENCOUNTER — Other Ambulatory Visit: Payer: Self-pay | Admitting: Nurse Practitioner

## 2015-12-05 MED ORDER — ESCITALOPRAM OXALATE 10 MG PO TABS: 10.0000 mg | ORAL_TABLET | Freq: Every day | ORAL | Status: DC

## 2015-12-05 NOTE — Telephone Encounter (Signed)
Completed.

## 2015-12-05 NOTE — Telephone Encounter (Signed)
Patient stated that she has started her lexipro. Patient stated that the medication comes in 20mg  tablets, and she only needs 10mg . patient requested another Rx be sent to her pharmacy. Please advise.

## 2015-12-05 NOTE — Telephone Encounter (Signed)
Dr. Mortimer Fries, Do you want to file an appeal or the insurance suggest to try Serevent Diskus or Stiolto respimat. Please advise. Thanks

## 2015-12-05 NOTE — Telephone Encounter (Signed)
Called [facility], they stated that Incruse was denied because they needed more information in order to process the claim.  Information was not received in allotted time. Will need to appeal decision.  [facility] is faxing denial letter with information for appeal to [place] office.  To [facility] for follow up

## 2015-12-06 ENCOUNTER — Institutional Professional Consult (permissible substitution): Payer: Medicare Other | Admitting: Internal Medicine

## 2015-12-06 ENCOUNTER — Other Ambulatory Visit: Payer: Self-pay | Admitting: Nurse Practitioner

## 2015-12-06 MED ORDER — TIOTROPIUM BROMIDE MONOHYDRATE 2.5 MCG/ACT IN AERS: 2.0000 | INHALATION_SPRAY | Freq: Every day | RESPIRATORY_TRACT | Status: DC

## 2015-12-06 NOTE — Telephone Encounter (Signed)
2.5 mcg

## 2015-12-06 NOTE — Telephone Encounter (Signed)
LMOM for pt to return call in regards to inhalers.

## 2015-12-06 NOTE — Telephone Encounter (Signed)
Patient returned call, may be reached at [phone].

## 2015-12-06 NOTE — Telephone Encounter (Signed)
No. Have patient go back to spiriva  She should be on breo and Spiriva

## 2015-12-06 NOTE — Telephone Encounter (Signed)
LMTCB x 1 

## 2015-12-06 NOTE — Telephone Encounter (Signed)
Spoke with pt, aware of recs.  rx's sent to preferred pharmacy.  Nothing further needed.  

## 2015-12-06 NOTE — Telephone Encounter (Signed)
Spoke with pt. She agreed with plan. Pt request Spiriva Resp and not Spiriva HH. Please advise on Spiriva Resp dose. Thanks

## 2015-12-07 ENCOUNTER — Ambulatory Visit: Payer: Medicare Other | Admitting: Neurology

## 2015-12-08 ENCOUNTER — Other Ambulatory Visit: Payer: Self-pay | Admitting: Nurse Practitioner

## 2015-12-18 ENCOUNTER — Other Ambulatory Visit: Payer: Self-pay | Admitting: Nurse Practitioner

## 2015-12-18 ENCOUNTER — Telehealth: Payer: Self-pay | Admitting: *Deleted

## 2015-12-18 NOTE — Telephone Encounter (Signed)
Patient has requested a medication refill for tramadol or flexeril

## 2015-12-18 NOTE — Telephone Encounter (Signed)
Ok to refill either of these medications?

## 2015-12-19 ENCOUNTER — Other Ambulatory Visit: Payer: Self-pay | Admitting: Nurse Practitioner

## 2015-12-19 MED ORDER — CYCLOBENZAPRINE HCL 5 MG PO TABS: 5.0000 mg | ORAL_TABLET | Freq: Three times a day (TID) | ORAL | Status: DC | PRN

## 2015-12-19 NOTE — Telephone Encounter (Signed)
I have sent in Flexeril to her pharmacy.

## 2015-12-20 ENCOUNTER — Other Ambulatory Visit: Payer: Self-pay | Admitting: Nurse Practitioner

## 2015-12-20 ENCOUNTER — Telehealth: Payer: Self-pay

## 2015-12-20 NOTE — Telephone Encounter (Signed)
[facility]ax[facility] rx tramdol 50 mg #12  With 0 refill per Lorane Gell NP to [place] [phone]

## 2015-12-21 ENCOUNTER — Encounter: Payer: Self-pay | Admitting: *Deleted

## 2015-12-21 ENCOUNTER — Telehealth: Payer: Self-pay | Admitting: Nurse Practitioner

## 2015-12-21 NOTE — Telephone Encounter (Signed)
Must be seen to be evaluated in office.

## 2015-12-21 NOTE — Telephone Encounter (Signed)
Left message for patient to return phone call in regards to scheduling appt.

## 2015-12-21 NOTE — Telephone Encounter (Signed)
Please advise 

## 2015-12-21 NOTE — Telephone Encounter (Signed)
Pt called about her inside mouth cracking and on her tongue and burning when she eats something. Pt states it's due to not swishing her mouth out. From the medication budesonide-formoterol (SYMBICORT) 160-4.5 MCG/ACT inhaler.  Pt states she needs a prescription for Nystatin liquid. [id number], [place] - [address]. Thank You!

## 2015-12-22 ENCOUNTER — Other Ambulatory Visit: Payer: Self-pay | Admitting: Nurse Practitioner

## 2015-12-22 ENCOUNTER — Telehealth: Payer: Self-pay | Admitting: *Deleted

## 2015-12-22 ENCOUNTER — Encounter: Payer: Self-pay | Admitting: Nurse Practitioner

## 2015-12-22 ENCOUNTER — Ambulatory Visit (INDEPENDENT_AMBULATORY_CARE_PROVIDER_SITE_OTHER): Payer: Medicare Other | Admitting: Nurse Practitioner

## 2015-12-22 VITALS — BP 114/72 | HR 76 | Resp 16 | Ht 66.0 in | Wt 206.0 lb

## 2015-12-22 DIAGNOSIS — B370 Candidal stomatitis: Secondary | ICD-10-CM | POA: Insufficient documentation

## 2015-12-22 DIAGNOSIS — J438 Other emphysema: Secondary | ICD-10-CM | POA: Diagnosis not present

## 2015-12-22 MED ORDER — NYSTATIN 100000 UNIT/ML MT SUSP: 5.0000 mL | Freq: Four times a day (QID) | OROMUCOSAL | Status: DC

## 2015-12-22 MED ORDER — TRAMADOL HCL 50 MG PO TABS: 50.0000 mg | ORAL_TABLET | Freq: Every day | ORAL | Status: DC

## 2015-12-22 NOTE — Patient Instructions (Signed)
Swish and swallow 4 x a day for 5-7 days.   Lots of water this time of year- helps get mucous up and out. Please don't force your cough- makes it worse and causes irritation.   Rinse your mouth out after using inhalers.   Follow up as needed.   Don't take Tramadol with codeine!!!!!!

## 2015-12-22 NOTE — Assessment & Plan Note (Addendum)
Patient is very noncompliant with her recommended medications from pulmonology. She reports they were not working for her. I advised her to let them know about this. In the meantime she is gone back to her old inhaler Symbicort and Spiriva. She is not rinsing her mouth and review reports sore tongue and mouth. See thrush

## 2015-12-22 NOTE — Progress Notes (Signed)
Patient ID: Brandy Armstrong, female    DOB: 05/17/1946  Age: 69 y.o. MRN: [id number]  CC: Thrush   HPI Brandy Armstrong presents for CC of 2-3 days of mouth burning.   1) 2-3 days of "thrush" she reports. She states she is being lazy and not rinsing her mouth out after use. She is not taking the inhalers that were prescribed by her pulmonologist. She has gone back to her Symbicort and Spiriva. She is to follow-up in 3 months from November with Dr. Mortimer Fries. She reports she did not feel that they were working for her and has not let anybody at their office know.  Patient also reports dry mouth, but does state that she does not drink enough water daily.   History Brandy Armstrong has a past medical history of Rheumatoid arthritis ([place]); GERD (gastroesophageal reflux disease); Atrial fibrillation ([place]); Urinary incontinence; Colon polyps; Depression with anxiety; Barretts esophagus; Rectal fistula; Vitamin D deficiency; Chest pain; COPD (chronic obstructive pulmonary disease) ([place]); and COPD (chronic obstructive pulmonary disease) ([place]).   She has past surgical history that includes Cholecystectomy (1987); Abdominal hysterectomy (1992); oophrectomy (Bilateral, 1992); Cardiac electrophysiology study and ablation (2013); Esophagogastroduodenoscopy (N/A, 05/06/2015); and Colonoscopy (N/A, 05/08/2015).   Her family history includes Cancer (age of onset: 31) in her sister; Cancer (age of onset: 28) in her father; Cancer (age of onset: 57) in her mother; Depression in her mother; Diabetes in her brother.She reports that she quit smoking about 4 years ago. She has never used smokeless tobacco. She reports that she does not drink alcohol or use illicit drugs.  Outpatient Prescriptions Prior to Visit  Medication Sig Dispense Refill  . amiodarone (PACERONE) 200 MG tablet Take 1 tablet (200 mg total) by mouth 2 (two) times daily. (Patient taking differently: Take 200 mg by mouth daily. ) 60 tablet 1  . Apixaban (ELIQUIS PO) Take by  mouth 2 (two) times daily.    [name] b complex vitamins tablet Take 1 tablet by mouth daily.    . budesonide-formoterol (SYMBICORT) 160-4.5 MCG/ACT inhaler Inhale into the lungs.    . cholestyramine (QUESTRAN) 4 G packet Take 1 packet by mouth as needed.    . clonazePAM (KLONOPIN) 0.5 MG tablet Take 1 tablet (0.5 mg total) by mouth 3 (three) times daily as needed for anxiety. 90 tablet 1  . cyclobenzaprine (FLEXERIL) 5 MG tablet Take 1 tablet (5 mg total) by mouth 3 (three) times daily as needed for muscle spasms. 30 tablet 0  . escitalopram (LEXAPRO) 10 MG tablet Take 1 tablet (10 mg total) by mouth daily. 30 tablet 1  . esomeprazole (NEXIUM) 40 MG capsule TAKE ONE CAPSULE BY MOUTH DAILY AT 12 NOON 30 capsule 3  . guaiFENesin-codeine 100-10 MG/5ML syrup Take 5 mLs by mouth every 4 (four) hours as needed for cough. 120 mL 2  . hydroxychloroquine (PLAQUENIL) 200 MG tablet Take 200 mg by mouth 2 (two) times daily.    [name] leflunomide (ARAVA) 20 MG tablet Take 20 mg by mouth daily.    [name] LYRICA 100 MG capsule TAKE ONE CAPSULE BY MOUTH 3 TIMES A DAY 90 capsule 0  . metoprolol succinate (TOPROL-XL) 25 MG 24 hr tablet   6  . pregabalin (LYRICA) 50 MG capsule Take 1 capsule (50 mg total) by mouth 3 (three) times daily. 90 capsule 0  . traMADol (ULTRAM) 50 MG tablet TAKE 1 TABLET BY MOUTH EVERY 6 HOURS AS NEEDED FOR MODERATE PAIN 12 tablet 0  . cholecalciferol (VITAMIN  D) 400 UNITS TABS tablet Take 400 Units by mouth. Reported on 12/22/2015    . levalbuterol (XOPENEX HFA) 45 MCG/ACT inhaler Inhale 2 puffs into the lungs every 4 (four) hours as needed for wheezing.    [name] Respiratory Therapy Supplies (FLUTTER) DEVI Use as directed. 1 each 0  . tiotropium (SPIRIVA) 18 MCG inhalation capsule Place 18 mcg into inhaler and inhale daily.    . Tiotropium Bromide Monohydrate (SPIRIVA RESPIMAT) 2.5 MCG/ACT AERS Inhale 2 puffs into the lungs daily. 4 g 5  . cetirizine (ZYRTEC) 10 MG tablet Take 1 tablet (10 mg total) by  mouth daily. (Patient not taking: Reported on 12/22/2015) 30 tablet 12  . ferrous sulfate 325 (65 FE) MG tablet Take 1 tablet (325 mg total) by mouth daily with breakfast. (Patient not taking: Reported on 12/22/2015) 1 tablet 3  . Fluticasone Furoate-Vilanterol (BREO ELLIPTA) 100-25 MCG/INH AEPB Inhale 1 puff into the lungs daily. (Patient not taking: Reported on 12/22/2015) 60 each 5  . Umeclidinium-Vilanterol 62.5-25 MCG/INH AEPB Inhale 1 puff into the lungs daily. (Patient not taking: Reported on 12/22/2015) 60 each 5   No facility-administered medications prior to visit.    ROS Review of Systems  Constitutional: Negative for fever, chills, diaphoresis and fatigue.  HENT: Positive for sore throat. Negative for drooling and trouble swallowing.   Respiratory: Negative for chest tightness, shortness of breath and wheezing.   Cardiovascular: Negative for chest pain, palpitations and leg swelling.  Gastrointestinal: Negative for nausea, vomiting and diarrhea.  Skin: Negative for rash.  Neurological: Positive for numbness. Negative for dizziness and headaches.    Objective:  BP 114/72 mmHg  Pulse 76  Resp 16  Ht 5\' 6"  (1.676 m)  Wt 206 lb (93.441 kg)  BMI 33.27 kg/m2  SpO2 97%  Physical Exam  Constitutional: She is oriented to person, place, and time. She appears well-developed and well-nourished. No distress.  HENT:  Head: Normocephalic and atraumatic.  Right Ear: External ear normal.  Left Ear: External ear normal.  Tongue and oropharynx are not swollen, erythematous, nor coated.  Neck: Normal range of motion. Neck supple.  Cardiovascular: Normal rate.   Pulmonary/Chest: Effort normal. She has no wheezes.  Decreased breath sounds in all fields  Lymphadenopathy:    She has no cervical adenopathy.  Neurological: She is alert and oriented to person, place, and time.  Skin: Skin is warm and dry. She is not diaphoretic.   Assessment & Plan:   Brandy Armstrong was seen today for  thrush.  Diagnoses and all orders for this visit:  Other emphysema ([place])  Thrush  Other orders -     nystatin (MYCOSTATIN) 100000 UNIT/ML suspension; Take 5 mLs (500,000 Units total) by mouth 4 (four) times daily.  I have discontinued Ms. Fulmore's ferrous sulfate, cetirizine, Fluticasone Furoate-Vilanterol, and Umeclidinium-Vilanterol. I am also having her start on nystatin. Additionally, I am having her maintain her cholestyramine, levalbuterol, hydroxychloroquine, leflunomide, metoprolol succinate, tiotropium, amiodarone, FLUTTER, clonazePAM, Apixaban (ELIQUIS PO), b complex vitamins, cholecalciferol, budesonide-formoterol, guaiFENesin-codeine, pregabalin, escitalopram, Tiotropium Bromide Monohydrate, esomeprazole, LYRICA, cyclobenzaprine, and traMADol.  Meds ordered this encounter  Medications  . nystatin (MYCOSTATIN) 100000 UNIT/ML suspension    Sig: Take 5 mLs (500,000 Units total) by mouth 4 (four) times daily.    Dispense:  120 mL    Refill:  0    Order Specific Question:  Supervising Provider    Answer:  Crecencio Mc [[id number]   Follow-up: Return if symptoms worsen or fail to improve.

## 2015-12-22 NOTE — Assessment & Plan Note (Signed)
Nystatin swish and swallow 5 days. Will follow as needed advised patient to rinse out mouth after using inhalers (did not differentiate between her inhalers LABA and anticholinergic due to her level of understanding). She was advised to increase water intake until urine is pale yellow/clear

## 2015-12-22 NOTE — Telephone Encounter (Signed)
Patient was seen today, she stated that the pharmacy did not receive her Rx for tramidol

## 2015-12-22 NOTE — Telephone Encounter (Signed)
Re-faxed this today. It was faxed on Wednesday. Thanks!

## 2015-12-22 NOTE — Telephone Encounter (Signed)
Brandy Armstrong im not sure what medication was sent in for her

## 2016-01-02 ENCOUNTER — Other Ambulatory Visit: Payer: Self-pay | Admitting: Nurse Practitioner

## 2016-01-02 ENCOUNTER — Encounter: Payer: Self-pay | Admitting: Neurology

## 2016-01-02 ENCOUNTER — Ambulatory Visit (INDEPENDENT_AMBULATORY_CARE_PROVIDER_SITE_OTHER): Payer: Medicare Other | Admitting: Neurology

## 2016-01-02 VITALS — BP 111/62 | HR 76 | Ht 66.0 in | Wt 207.4 lb

## 2016-01-02 DIAGNOSIS — E531 Pyridoxine deficiency: Secondary | ICD-10-CM | POA: Diagnosis not present

## 2016-01-02 DIAGNOSIS — E519 Thiamine deficiency, unspecified: Secondary | ICD-10-CM | POA: Diagnosis not present

## 2016-01-02 DIAGNOSIS — G629 Polyneuropathy, unspecified: Secondary | ICD-10-CM

## 2016-01-02 DIAGNOSIS — E538 Deficiency of other specified B group vitamins: Secondary | ICD-10-CM | POA: Diagnosis not present

## 2016-01-02 MED ORDER — TOPIRAMATE 25 MG PO TABS: 50.0000 mg | ORAL_TABLET | Freq: Every day | ORAL | Status: DC

## 2016-01-02 MED ORDER — LIDOCAINE 5 % EX OINT: 1.0000 "application " | TOPICAL_OINTMENT | CUTANEOUS | Status: DC | PRN

## 2016-01-02 NOTE — Patient Instructions (Signed)
Overall you are doing fairly well but I do want to suggest a few things today:   Remember to drink plenty of fluid, eat healthy meals and do not skip any meals. Try to eat protein with a every meal and eat a healthy snack such as fruit or nuts in between meals. Try to keep a regular sleep-wake schedule and try to exercise daily, particularly in the form of walking, 20-30 minutes a day, if you can.   As far as your medications are concerned, I would like to suggest: Topiramate 1 pill at night and can increase to 2 pills as needed  As far as diagnostic testing: Labs, emg/ncs  I would like to see you back for emg/ncs, sooner if we need to. Please call us with any interim questions, concerns, problems, updates or refill requests.   Our phone number is [phone]. We also have an after hours call service for urgent matters and there is a physician on-call for urgent questions. For any emergencies you know to call 911 or go to the nearest emergency room

## 2016-01-02 NOTE — Progress Notes (Signed)
[facility]    Provider:  Dr Jaynee Eagles Referring Provider: Rubbie Battiest, NP Primary Care Physician:  Rubbie Battiest, NP  CC:  Neuropathy  HPI:  Brandy Armstrong is a 70 y.o. female here as a referral from Dr. Flonnie Overman for neuropathy. She has a PMHx of afib,anxiety. She has numbness, tingling, burning in the feet. Also has symptoms in the hands and feet. Slowly progressive. Feet and hands feel cold. Started a year ago. She was started on Gabapentin and then started the Lyrica. She is on 100mg  twice a day. She has gained weight with lyrica. She also has a tremor in the hands. She has had it for a year or more. She can't hold a spoon or fork, she drops things. No weakness. She has RA. She has had low B12 in the past and does not take B vitamins regularly. She is on Amiodarone chronically. No other family members with tremor. Her half brother has neuropathy. She has shooting pain in the toes as well. Her balance is OK. Foot pain is worse at night when laying in bed. Lyrica has helped but she has gained a lot of weight. She is on Amiodarone and symptoms may have started about the same time she started this medication. She drinks a lot of caffeine. No other focal neurologic deficits  Reviewed notes, labs and imaging from outside physicians, which showed:(personally reviewed imaging and agree with following)  Ct of the head 10/2015: There is no intra or extra-axial fluid collection or mass lesion.The basilar cisterns and ventricles have a normal appearance. There is no CT evidence for acute infarction or hemorrhage.  Bone windows show no calvarial fracture. There is atherosclerotic calcification of the internal carotid arteries. There is significant sinus disease. Air-fluid levels in mucoperiosteal thickening are present in the paranasal sinuses. Mastoid air cells are normally  aerated.  IMPRESSION: 1. No evidence for acute intracranial abnormality. 2. Significant sinus  disease.  HgbA1c 6.0 B12 268  Review of Systems: Patient complains of symptoms per HPI as well as the following symptoms: weight gain, blurred vision, easy bruising and bleeding, numbness, tremor, joint pain, anxiety. Pertinent negatives per HPI. All others negative.   Social History   Social History  . Marital Status: Single    Spouse Name: N/A  . Number of Children: 0  . Years of Education: 16   Occupational History  . Retired    Social History Main Topics  . Smoking status: Former Smoker -- 1.00 packs/day for 40 years    Quit date: 12/16/2011  . Smokeless tobacco: Never Used  . Alcohol Use: No     Comment: Occasionally has a drink  . Drug Use: No  . Sexual Activity: Yes   Other Topics Concern  . Not on file   Social History Narrative   [name] was born and reared in [place]. She attended [employer] and graduated in 1971 with her [place] in Education. 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 She then worked for a [employer] in [place]. She is single. She is currently retired. She loves reading. She loves to spend time with her dog.       Caffeine use: 2 cups coffee per day   2 glasses iced tea/ day    Family History  Problem Relation Age of Onset  . Depression Mother   . Cancer Mother 24    pancreatic cancer  . Cancer  Father 7    colon cancer  . Cancer Sister 30    breast cancer  . Diabetes Brother   . Neuropathy Neg Hx     Past Medical History  Diagnosis Date  . Rheumatoid arthritis ([place])     On methotrexate and orencia  . GERD (gastroesophageal reflux disease)   . Atrial fibrillation [facility])     a. s/p ablation 01/2012 in [facility] by Dr. Boyd Kerbs;  b. On sotalol & Xarelto;  c. 02/2014 Echo: EF 50-55%, mild conc LVH, nl LA size/structure;  d. Recurrent afib 8/15 & 08/29/2014.  [name] Urinary incontinence   . Colon polyps   . Depression with anxiety   . Barretts esophagus   . Rectal fistula    . Vitamin D deficiency   . Chest pain     a. 02/2014 Myoview: Ef 50%, no ischemia.  [name] COPD (chronic obstructive pulmonary disease) ([place])   . COPD (chronic obstructive pulmonary disease) ([place])   . Neuropathy [facility])     Past Surgical History  Procedure Laterality Date  . Cholecystectomy  1987  . Abdominal hysterectomy  1992  . Oophrectomy Bilateral 1992  . Cardiac electrophysiology study and ablation  2013  . Esophagogastroduodenoscopy N/A 05/06/2015    Procedure: ESOPHAGOGASTRODUODENOSCOPY (EGD);  Surgeon: Lollie Sails, MD;  Location: [facility] ENDOSCOPY;  Service: Endoscopy;  Laterality: N/A;  . Colonoscopy N/A 05/08/2015    Procedure: COLONOSCOPY;  Surgeon: Manya Silvas, MD;  Location: [facility] ENDOSCOPY;  Service: Endoscopy;  Laterality: N/A;    Current Outpatient Prescriptions  Medication Sig Dispense Refill  . amiodarone (PACERONE) 200 MG tablet Take 1 tablet (200 mg total) by mouth 2 (two) times daily. (Patient taking differently: Take 200 mg by mouth daily. ) 60 tablet 1  . Apixaban (ELIQUIS PO) Take by mouth 2 (two) times daily.    [name] b complex vitamins tablet Take 1 tablet by mouth daily.    . budesonide-formoterol (SYMBICORT) 160-4.5 MCG/ACT inhaler Inhale into the lungs.    . cholecalciferol (VITAMIN D) 400 UNITS TABS tablet Take 400 Units by mouth. Reported on 12/22/2015    . cholestyramine (QUESTRAN) 4 G packet Take 1 packet by mouth as needed.    . clonazePAM (KLONOPIN) 0.5 MG tablet Take 1 tablet (0.5 mg total) by mouth 3 (three) times daily as needed for anxiety. 90 tablet 1  . cyclobenzaprine (FLEXERIL) 5 MG tablet Take 1 tablet (5 mg total) by mouth 3 (three) times daily as needed for muscle spasms. 30 tablet 0  . escitalopram (LEXAPRO) 10 MG tablet TAKE 1 TABLET BY MOUTH ONCE A DAY 30 tablet 1  . esomeprazole (NEXIUM) 40 MG capsule TAKE ONE CAPSULE BY MOUTH DAILY AT 12 NOON 30 capsule 3  . hydroxychloroquine (PLAQUENIL) 200 MG tablet Take 200 mg by mouth 2 (two) times  daily.    [name] leflunomide (ARAVA) 20 MG tablet Take 20 mg by mouth daily.    [name] levalbuterol (XOPENEX HFA) 45 MCG/ACT inhaler Inhale 2 puffs into the lungs every 4 (four) hours as needed for wheezing.    [name] LYRICA 100 MG capsule TAKE ONE CAPSULE BY MOUTH 3 TIMES A DAY 90 capsule 0  . metoprolol succinate (TOPROL-XL) 25 MG 24 hr tablet   6  . nystatin (MYCOSTATIN) 100000 UNIT/ML suspension Take 5 mLs (500,000 Units total) by mouth 4 (four) times daily. 120 mL 0  . Respiratory Therapy Supplies (FLUTTER) DEVI Use as directed. 1 each 0  . tiotropium (SPIRIVA) 18 MCG inhalation  capsule Place 18 mcg into inhaler and inhale daily.    . traMADol (ULTRAM) 50 MG tablet Take 1 tablet (50 mg total) by mouth at bedtime. 30 tablet 0  . guaiFENesin-codeine 100-10 MG/5ML syrup Take 5 mLs by mouth every 4 (four) hours as needed for cough. (Patient not taking: Reported on 01/02/2016) 120 mL 2  . lidocaine (XYLOCAINE) 5 % ointment Apply 1 application topically as needed. 35.44 g 0  . Tiotropium Bromide Monohydrate (SPIRIVA RESPIMAT) 2.5 MCG/ACT AERS Inhale 2 puffs into the lungs daily. (Patient not taking: Reported on 01/02/2016) 4 g 5  . topiramate (TOPAMAX) 25 MG tablet Take 2 tablets (50 mg total) by mouth at bedtime. Start with one pill at night and can increase to 2 pills 60 tablet 11   No current facility-administered medications for this visit.    Allergies as of 01/02/2016 - Review Complete 01/02/2016  Allergen Reaction Noted  . Latex  08/18/2013    Vitals: BP 111/62 mmHg  Pulse 76  Ht 5\' 6"  (1.676 m)  Wt 207 lb 6.4 oz (94.076 kg)  BMI 33.49 kg/m2  SpO2 98% Last Weight:  Wt Readings from Last 1 Encounters:  01/02/16 207 lb 6.4 oz (94.076 kg)   Last Height:   Ht Readings from Last 1 Encounters:  01/02/16 5\' 6"  (1.676 m)   Physical exam: Exam: Gen: NAD, conversant, well nourised, obese, well groomed                     CV: RRR, no MRG. No Carotid Bruits. No peripheral edema, warm, nontender Eyes:  Conjunctivae clear without exudates or hemorrhage  Neuro: Detailed Neurologic Exam  Speech:    Speech is normal; fluent and spontaneous with normal comprehension.  Cognition:    The patient is oriented to person, place, and time;     recent and remote memory intact;     language fluent;     normal attention, concentration,     fund of knowledge Cranial Nerves:    The pupils are equal, round, and reactive to light. The fundi are flat. Visual fields are full to finger confrontation. Extraocular movements are intact. Trigeminal sensation is intact and the muscles of mastication are normal. The face is symmetric. The palate elevates in the midline. Hearing intact. Voice is normal. Shoulder shrug is normal. The tongue has normal motion without fasciculations.   Coordination:    No ataxia  Gait:    Heel-toe and tandem gait are impiared.   Motor Observation:    No asymmetry, no atrophy, and no involuntary movements noted. Tone:    Normal muscle tone.    Posture:    Posture is normal. normal erect    Strength: mild bilat hip weakness. Otherwise strength is V/V in the upper and lower limbs.      Sensation: decreased pin prick and temp to knees. Absent proprioception and vibration at the great toes.      Reflex Exam:  DTR's: Absent AJs otherwise deep tendon reflexes in the upper and lower extremities are normal bilaterally.   Toes:    The toes are downgoing bilaterally.   Clonus:    Clonus is absent.       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 She has several risk factors including elevated HgbA1c, Hx of B12 deficiency, Rheumatoid Arthritis and Amiodarone usage. Will order a complete serum  neuropathy screen. Will fax to her cardiologist to see if there is any possibility of changing Amiodarone to see if neuropathy improves. For her  tremor, discontinue excessive caffeine intake and will check thyroid.  EMG/NCS: 4 limb   Sarina Ill, MD  [facility] [address] [place], [place] [address]  Phone [phone] Fax [phone]

## 2016-01-03 LAB — SPECIMEN STATUS REPORT

## 2016-01-05 ENCOUNTER — Telehealth: Payer: Self-pay | Admitting: Neurology

## 2016-01-05 ENCOUNTER — Institutional Professional Consult (permissible substitution): Payer: Medicare Other | Admitting: Internal Medicine

## 2016-01-05 NOTE — Telephone Encounter (Signed)
I called and LMVM for her that yes topamax was for her tremors.  She is to call back if questions.

## 2016-01-05 NOTE — Telephone Encounter (Signed)
Pt called and would like clarification on why she was prescribed topiramate (TOPAMAX) 25 MG tablet. She doesn't know if it is for tremors or not. Please call and advise [phone]

## 2016-01-08 LAB — THYROID PANEL WITH TSH
Free Thyroxine Index: 2.8 (ref 1.2–4.9)
T3 Uptake Ratio: 26 % (ref 24–39)
T4, Total: 10.7 ug/dL (ref 4.5–12.0)
TSH: 2.64 u[IU]/mL (ref 0.450–4.500)

## 2016-01-08 LAB — MULTIPLE MYELOMA PANEL, SERUM
ALPHA 1: 0.4 g/dL (ref 0.0–0.4)
ALPHA2 GLOB SERPL ELPH-MCNC: 1 g/dL (ref 0.4–1.0)
Albumin SerPl Elph-Mcnc: 3.2 g/dL (ref 2.9–4.4)
Albumin/Glob SerPl: 0.9 (ref 0.7–1.7)
B-Globulin SerPl Elph-Mcnc: 1.3 g/dL (ref 0.7–1.3)
GAMMA GLOB SERPL ELPH-MCNC: 1 g/dL (ref 0.4–1.8)
GLOBULIN, TOTAL: 3.6 g/dL (ref 2.2–3.9)
IGG (IMMUNOGLOBIN G), SERUM: 832 mg/dL (ref 700–1600)
IgA/Immunoglobulin A, Serum: 330 mg/dL (ref 87–352)
IgM (Immunoglobulin M), Srm: 121 mg/dL (ref 26–217)

## 2016-01-08 LAB — COMPREHENSIVE METABOLIC PANEL
ALT: 12 IU/L (ref 0–32)
AST: 18 IU/L (ref 0–40)
Albumin/Globulin Ratio: 1.3 (ref 1.1–2.5)
Albumin: 3.9 g/dL (ref 3.6–4.8)
Alkaline Phosphatase: 113 IU/L (ref 39–117)
BILIRUBIN TOTAL: 0.4 mg/dL (ref 0.0–1.2)
BUN/Creatinine Ratio: 13 (ref 11–26)
BUN: 12 mg/dL (ref 8–27)
CALCIUM: 9.2 mg/dL (ref 8.7–10.3)
CHLORIDE: 100 mmol/L (ref 96–106)
CO2: 22 mmol/L (ref 18–29)
Creatinine, Ser: 0.91 mg/dL (ref 0.57–1.00)
GFR calc non Af Amer: 65 mL/min/{1.73_m2} (ref >59)
GFR, EST AFRICAN AMERICAN: 76 mL/min/{1.73_m2} (ref >59)
GLUCOSE: 74 mg/dL (ref 65–99)
Globulin, Total: 2.9 g/dL (ref 1.5–4.5)
Potassium: 4.5 mmol/L (ref 3.5–5.2)
Sodium: 141 mmol/L (ref 134–144)
TOTAL PROTEIN: 6.8 g/dL (ref 6.0–8.5)

## 2016-01-08 LAB — VITAMIN B1: THIAMINE: 145.7 nmol/L (ref 66.5–200.0)

## 2016-01-08 LAB — HEAVY METALS, BLOOD
ARSENIC: 7 ug/L (ref 2–23)
LEAD, BLOOD: NOT DETECTED ug/dL (ref 0–19)
Mercury: NOT DETECTED ug/L (ref 0.0–14.9)

## 2016-01-08 LAB — B12 AND FOLATE PANEL
Folate: 11.9 ng/mL (ref >3.0)
Vitamin B-12: 378 pg/mL (ref 211–946)

## 2016-01-08 LAB — METHYLMALONIC ACID, SERUM: METHYLMALONIC ACID: 348 nmol/L (ref 0–378)

## 2016-01-08 LAB — B. BURGDORFI ANTIBODIES: Lyme IgG/IgM Ab: <0.91 {ISR} (ref 0.00–0.90)

## 2016-01-08 LAB — VITAMIN B6: Vitamin B6: 7.1 ug/L (ref 2.0–32.8)

## 2016-01-08 LAB — RPR: RPR Ser Ql: NONREACTIVE

## 2016-01-08 LAB — HEPATITIS C ANTIBODY

## 2016-01-09 ENCOUNTER — Telehealth: Payer: Self-pay | Admitting: *Deleted

## 2016-01-09 NOTE — Telephone Encounter (Signed)
Called and spoke to pt about normal labs per Dr Jaynee Eagles. She verbalized understanding.

## 2016-01-09 NOTE — Telephone Encounter (Signed)
-----   Message from Melvenia Beam, MD sent at 01/08/2016 12:08 PM EST ----- Labs all normal thanks

## 2016-01-26 DIAGNOSIS — H268 Other specified cataract: Secondary | ICD-10-CM | POA: Diagnosis not present

## 2016-01-29 ENCOUNTER — Other Ambulatory Visit: Payer: Self-pay | Admitting: Nurse Practitioner

## 2016-01-29 NOTE — Telephone Encounter (Signed)
Last OV 12/22/15 (for thrush). No future appt. Okay to refill?

## 2016-01-30 DIAGNOSIS — Z79899 Other long term (current) drug therapy: Secondary | ICD-10-CM | POA: Diagnosis not present

## 2016-01-30 DIAGNOSIS — M069 Rheumatoid arthritis, unspecified: Secondary | ICD-10-CM | POA: Diagnosis not present

## 2016-01-30 DIAGNOSIS — H2513 Age-related nuclear cataract, bilateral: Secondary | ICD-10-CM | POA: Diagnosis not present

## 2016-01-31 ENCOUNTER — Other Ambulatory Visit: Payer: Self-pay

## 2016-01-31 MED ORDER — PREGABALIN 100 MG PO CAPS: 100.0000 mg | ORAL_CAPSULE | Freq: Two times a day (BID) | ORAL | Status: DC

## 2016-02-01 ENCOUNTER — Encounter: Payer: Medicare Other | Admitting: Neurology

## 2016-02-05 DIAGNOSIS — M069 Rheumatoid arthritis, unspecified: Secondary | ICD-10-CM | POA: Diagnosis not present

## 2016-02-05 DIAGNOSIS — R0600 Dyspnea, unspecified: Secondary | ICD-10-CM | POA: Diagnosis not present

## 2016-02-06 ENCOUNTER — Telehealth: Payer: Self-pay | Admitting: *Deleted

## 2016-02-06 NOTE — Telephone Encounter (Signed)
Please advise thanks.

## 2016-02-06 NOTE — Telephone Encounter (Signed)
Please advise a time on carrie's schedule to place patient Thursday or Monday. She has requested to change her lyrical medication, she would like to meet with Morey Hummingbird, to have a medication consultation.   Contact [phone]

## 2016-02-07 NOTE — Telephone Encounter (Signed)
I don't know if Sydell Axon will come back on Thursday- may want to check on that. If she doesn't Kevyn can be put on my schedule Thursday at 4:15 pm for a 30 minute visit

## 2016-02-12 ENCOUNTER — Telehealth: Payer: Self-pay | Admitting: Nurse Practitioner

## 2016-02-12 NOTE — Telephone Encounter (Signed)
Pt called about wanting to stop taking pregabalin (LYRICA) 100 MG capsule pt states it's making her gain weight. Pt would like start taking symbalta. [id number] [place], [place] - [address]. Call pt @ [phone]. Thank you!

## 2016-02-12 NOTE — Telephone Encounter (Signed)
Looked at past notes and spoke with patient and scheduled to see C.Doss on Thursday at 430 per her requests in prior notes.  Thanks

## 2016-02-12 NOTE — Telephone Encounter (Signed)
Thanks

## 2016-02-15 ENCOUNTER — Ambulatory Visit (INDEPENDENT_AMBULATORY_CARE_PROVIDER_SITE_OTHER): Payer: Medicare Other | Admitting: Nurse Practitioner

## 2016-02-15 ENCOUNTER — Encounter: Payer: Self-pay | Admitting: Nurse Practitioner

## 2016-02-15 VITALS — BP 100/60 | HR 81 | Temp 98.1°F | Resp 12 | Ht 61.0 in | Wt 206.5 lb

## 2016-02-15 DIAGNOSIS — M545 Low back pain, unspecified: Secondary | ICD-10-CM

## 2016-02-15 DIAGNOSIS — R202 Paresthesia of skin: Secondary | ICD-10-CM | POA: Diagnosis not present

## 2016-02-15 DIAGNOSIS — R200 Anesthesia of skin: Secondary | ICD-10-CM

## 2016-02-15 MED ORDER — DULOXETINE HCL 20 MG PO CPEP: 20.0000 mg | ORAL_CAPSULE | Freq: Every day | ORAL | Status: DC

## 2016-02-15 MED ORDER — PREGABALIN 50 MG PO CAPS: 50.0000 mg | ORAL_CAPSULE | Freq: Two times a day (BID) | ORAL | Status: DC

## 2016-02-15 MED ORDER — CYCLOBENZAPRINE HCL 10 MG PO TABS: 10.0000 mg | ORAL_TABLET | Freq: Three times a day (TID) | ORAL | Status: DC | PRN

## 2016-02-15 NOTE — Progress Notes (Signed)
Patient ID: Brandy Armstrong, female    DOB: 1946/02/08  Age: 70 y.o. MRN: [id number]  CC: Follow-up and Back Pain   HPI Brandy Armstrong presents for follow up of medications and low back pain.   1)  Lyrica- not helpful  Wants to stop because she feels it causes her to gain weight  According to our records she has not gained weight   2) LBP- bilaterally, moderate in severity   Flexeril 5 mg doesn't work for pt Tramadol- not helpful  Flexeril 2 tablets of 5 mg she tried and found not helpful  No radiculopathy  Denies heat or ice use   History Brandy Armstrong has a past medical history of Rheumatoid arthritis ([place]); GERD (gastroesophageal reflux disease); Atrial fibrillation ([place]); Urinary incontinence; Colon polyps; Depression with anxiety; Barretts esophagus; Rectal fistula; Vitamin D deficiency; Chest pain; COPD (chronic obstructive pulmonary disease) ([facility]); COPD (chronic obstructive pulmonary disease) ([place]); and Neuropathy ([place]).   She has past surgical history that includes Cholecystectomy (1987); Abdominal hysterectomy (1992); oophrectomy (Bilateral, 1992); Cardiac electrophysiology study and ablation (2013); Esophagogastroduodenoscopy (N/A, 05/06/2015); and Colonoscopy (N/A, 05/08/2015).   Her family history includes Cancer (age of onset: 71) in her sister; Cancer (age of onset: 28) in her father; Cancer (age of onset: 76) in her mother; Depression in her mother; Diabetes in her brother. There is no history of Neuropathy.She reports that she quit smoking about 4 years ago. She has never used smokeless tobacco. She reports that she does not drink alcohol or use illicit drugs.  Outpatient Prescriptions Prior to Visit  Medication Sig Dispense Refill  . amiodarone (PACERONE) 200 MG tablet Take 1 tablet (200 mg total) by mouth 2 (two) times daily. (Patient taking differently: Take 200 mg by mouth daily. ) 60 tablet 1  . Apixaban (ELIQUIS PO) Take by mouth 2 (two) times daily.    . budesonide-formoterol  (SYMBICORT) 160-4.5 MCG/ACT inhaler Inhale into the lungs.    . cholestyramine (QUESTRAN) 4 G packet Take 1 packet by mouth as needed.    . clonazePAM (KLONOPIN) 0.5 MG tablet Take 1 tablet (0.5 mg total) by mouth 3 (three) times daily as needed for anxiety. 90 tablet 1  . escitalopram (LEXAPRO) 10 MG tablet TAKE 1 TABLET BY MOUTH ONCE A DAY 30 tablet 1  . esomeprazole (NEXIUM) 40 MG capsule TAKE ONE CAPSULE BY MOUTH DAILY AT 12 NOON 30 capsule 3  . guaiFENesin-codeine 100-10 MG/5ML syrup Take 5 mLs by mouth every 4 (four) hours as needed for cough. 120 mL 2  . hydroxychloroquine (PLAQUENIL) 200 MG tablet Take 200 mg by mouth 2 (two) times daily.    [name] leflunomide (ARAVA) 20 MG tablet Take 20 mg by mouth daily.    [name] levalbuterol (XOPENEX HFA) 45 MCG/ACT inhaler Inhale 2 puffs into the lungs every 4 (four) hours as needed for wheezing.    . lidocaine (XYLOCAINE) 5 % ointment Apply 1 application topically as needed. 35.44 g 0  . metoprolol succinate (TOPROL-XL) 25 MG 24 hr tablet   6  . Respiratory Therapy Supplies (FLUTTER) DEVI Use as directed. 1 each 0  . cyclobenzaprine (FLEXERIL) 5 MG tablet Take 1 tablet (5 mg total) by mouth 3 (three) times daily as needed for muscle spasms. 30 tablet 0  . pregabalin (LYRICA) 100 MG capsule Take 1 capsule (100 mg total) by mouth 2 (two) times daily. 90 capsule 0  . topiramate (TOPAMAX) 25 MG tablet Take 2 tablets (50 mg total) by mouth at bedtime.  Start with one pill at night and can increase to 2 pills 60 tablet 11  . traMADol (ULTRAM) 50 MG tablet Take 1 tablet (50 mg total) by mouth at bedtime. 30 tablet 0  . b complex vitamins tablet Take 1 tablet by mouth daily. Reported on 02/15/2016    . cholecalciferol (VITAMIN D) 400 UNITS TABS tablet Take 400 Units by mouth. Reported on 02/15/2016    . tiotropium (SPIRIVA) 18 MCG inhalation capsule Place 18 mcg into inhaler and inhale daily. Reported on 02/15/2016    . nystatin (MYCOSTATIN) 100000 UNIT/ML suspension  Take 5 mLs (500,000 Units total) by mouth 4 (four) times daily. (Patient not taking: Reported on 02/15/2016) 120 mL 0  . Tiotropium Bromide Monohydrate (SPIRIVA RESPIMAT) 2.5 MCG/ACT AERS Inhale 2 puffs into the lungs daily. (Patient not taking: Reported on 01/02/2016) 4 g 5   No facility-administered medications prior to visit.    ROS Review of Systems  Constitutional: Negative for fever, chills, diaphoresis and fatigue.  Respiratory: Negative for chest tightness, shortness of breath and wheezing.   Cardiovascular: Negative for chest pain, palpitations and leg swelling.  Gastrointestinal: Negative for nausea, vomiting and diarrhea.  Musculoskeletal: Positive for myalgias and back pain. Negative for joint swelling, arthralgias, gait problem, neck pain and neck stiffness.  Skin: Negative for rash.  Neurological: Positive for numbness. Negative for dizziness, weakness and headaches.  Psychiatric/Behavioral: Negative for suicidal ideas and sleep disturbance. The patient is nervous/anxious.     Objective:  BP 100/60 mmHg  Pulse 81  Temp(Src) 98.1 F (36.7 C) (Oral)  Resp 12  Ht 5\' 1"  (1.549 m)  Wt 206 lb 8 oz (93.668 kg)  BMI 39.04 kg/m2  SpO2 96%  Physical Exam  Constitutional: She is oriented to person, place, and time. She appears well-developed and well-nourished. No distress.  HENT:  Head: Normocephalic and atraumatic.  Right Ear: External ear normal.  Left Ear: External ear normal.  Cardiovascular: Normal rate, regular rhythm and normal heart sounds.  Exam reveals no gallop and no friction rub.   No murmur heard. Pulmonary/Chest: Effort normal and breath sounds normal. No respiratory distress. She has no wheezes. She has no rales. She exhibits no tenderness.  Musculoskeletal: Normal range of motion. She exhibits tenderness. She exhibits no edema.  Mild tenderness to palpation of lower back paraspinal muscles lumbar and sacral bilaterally  Neurological: She is alert and oriented  to person, place, and time. No cranial nerve deficit. She exhibits normal muscle tone. Coordination normal.  Skin: Skin is warm and dry. No rash noted. She is not diaphoretic.  Psychiatric: She has a normal mood and affect. Her behavior is normal. Judgment and thought content normal.      Assessment & Plan:   There are no diagnoses linked to this encounter. I have discontinued [name]'s Tiotropium Bromide Monohydrate, cyclobenzaprine, nystatin, traMADol, topiramate, and pregabalin. I am also having her start on cyclobenzaprine, DULoxetine, and pregabalin. Additionally, I am having her maintain her cholestyramine, levalbuterol, hydroxychloroquine, leflunomide, metoprolol succinate, tiotropium, amiodarone, FLUTTER, clonazePAM, Apixaban (ELIQUIS PO), b complex vitamins, cholecalciferol, budesonide-formoterol, guaiFENesin-codeine, esomeprazole, escitalopram, and lidocaine.  Meds ordered this encounter  Medications  . cyclobenzaprine (FLEXERIL) 10 MG tablet    Sig: Take 1 tablet (10 mg total) by mouth 3 (three) times daily as needed for muscle spasms.    Dispense:  30 tablet    Refill:  0    Order Specific Question:  Supervising Provider    Answer:  Deborra Medina L [[id number]]  .  DULoxetine (CYMBALTA) 20 MG capsule    Sig: Take 1 capsule (20 mg total) by mouth daily.    Dispense:  30 capsule    Refill:  0    Order Specific Question:  Supervising Provider    Answer:  Deborra Medina L [[id number]]  . pregabalin (LYRICA) 50 MG capsule    Sig: Take 1 capsule (50 mg total) by mouth 2 (two) times daily.    Dispense:  21 capsule    Refill:  0    Order Specific Question:  Supervising Provider    Answer:  Crecencio Mc [[id number]]     Follow-up: Return in about 4 weeks (around 03/14/2016) for follow up .

## 2016-02-15 NOTE — Patient Instructions (Addendum)
NO tramadol with the Cymbalta   Cymbalta 20 mg once daily for 1 month (we can up it in 2 weeks)  Lyrica- take 50 mg capsules 1 by mouth twice daily for 7 days then 50 mg 1 capsule by mouth in the morning for 7 days- then you can stop the Lyrica  Follow up 2-4 weeks

## 2016-02-15 NOTE — Progress Notes (Signed)
Pre-visit discussion using our clinic review tool. No additional management support is needed unless otherwise documented below in the visit note.  

## 2016-02-16 ENCOUNTER — Telehealth: Payer: Self-pay | Admitting: Nurse Practitioner

## 2016-02-16 NOTE — Telephone Encounter (Signed)
Brandy Armstrong. She can start both- cymbalta 20 mg once daily AND lyrica 50 mg twice daily (the new low dosage)

## 2016-02-16 NOTE — Telephone Encounter (Signed)
Pt called about wanting to know when to start the medication of DULoxetine (CYMBALTA) 20 MG capsule? The medication was prescribed yesterday.  Call pt @ [phone]. Thank you!

## 2016-02-16 NOTE — Telephone Encounter (Signed)
Left VM for Patient with instructions.

## 2016-02-16 NOTE — Telephone Encounter (Signed)
Spoke with patient, she is confused OV note states to taper Lyrica and and start cymbalta, can this be done at the same time? Or does she need to wait to start the cymbalta after the lyrica taper? thanks

## 2016-02-19 NOTE — Assessment & Plan Note (Signed)
The back pain is stable patient will try Cymbalta for nerve related pains called in Flexeril 10 mg tablets to take up to 3 times a day as needed for low back pain Advised alternating heat and ice and stretching

## 2016-02-19 NOTE — Assessment & Plan Note (Addendum)
Patient has failed gabapentin and Lyrica Willing to try Cymbalta 20 mg and titrate up Switch Lyrica 100 mg twice daily to Lyrica 50 mg twice daily for 7 days then 50 mg once daily for 7 days then off.  Follow-up in 2-4 weeks  Caution strongly against Cymbalta and tramadol altogether patient's that this is not a problem

## 2016-02-22 DIAGNOSIS — I4891 Unspecified atrial fibrillation: Secondary | ICD-10-CM | POA: Diagnosis not present

## 2016-02-22 DIAGNOSIS — M0579 Rheumatoid arthritis with rheumatoid factor of multiple sites without organ or systems involvement: Secondary | ICD-10-CM | POA: Diagnosis not present

## 2016-02-22 DIAGNOSIS — J439 Emphysema, unspecified: Secondary | ICD-10-CM | POA: Diagnosis not present

## 2016-02-22 DIAGNOSIS — Z01818 Encounter for other preprocedural examination: Secondary | ICD-10-CM | POA: Diagnosis not present

## 2016-02-26 DIAGNOSIS — H2511 Age-related nuclear cataract, right eye: Secondary | ICD-10-CM | POA: Diagnosis not present

## 2016-02-26 DIAGNOSIS — E669 Obesity, unspecified: Secondary | ICD-10-CM | POA: Diagnosis not present

## 2016-02-26 DIAGNOSIS — K210 Gastro-esophageal reflux disease with esophagitis: Secondary | ICD-10-CM | POA: Diagnosis not present

## 2016-02-26 DIAGNOSIS — Z79899 Other long term (current) drug therapy: Secondary | ICD-10-CM | POA: Diagnosis not present

## 2016-02-26 DIAGNOSIS — F418 Other specified anxiety disorders: Secondary | ICD-10-CM | POA: Diagnosis not present

## 2016-02-26 DIAGNOSIS — I4891 Unspecified atrial fibrillation: Secondary | ICD-10-CM | POA: Diagnosis not present

## 2016-02-26 DIAGNOSIS — J449 Chronic obstructive pulmonary disease, unspecified: Secondary | ICD-10-CM | POA: Diagnosis not present

## 2016-02-26 DIAGNOSIS — F17210 Nicotine dependence, cigarettes, uncomplicated: Secondary | ICD-10-CM | POA: Diagnosis not present

## 2016-03-05 ENCOUNTER — Other Ambulatory Visit: Payer: Self-pay | Admitting: Nurse Practitioner

## 2016-03-06 ENCOUNTER — Other Ambulatory Visit: Payer: Self-pay | Admitting: Nurse Practitioner

## 2016-03-08 DIAGNOSIS — J449 Chronic obstructive pulmonary disease, unspecified: Secondary | ICD-10-CM | POA: Diagnosis not present

## 2016-03-08 DIAGNOSIS — R0602 Shortness of breath: Secondary | ICD-10-CM | POA: Diagnosis not present

## 2016-03-11 DIAGNOSIS — R911 Solitary pulmonary nodule: Secondary | ICD-10-CM | POA: Diagnosis not present

## 2016-03-11 DIAGNOSIS — R0600 Dyspnea, unspecified: Secondary | ICD-10-CM | POA: Diagnosis not present

## 2016-03-18 DIAGNOSIS — Z87891 Personal history of nicotine dependence: Secondary | ICD-10-CM | POA: Diagnosis not present

## 2016-03-18 DIAGNOSIS — I10 Essential (primary) hypertension: Secondary | ICD-10-CM | POA: Diagnosis not present

## 2016-03-18 DIAGNOSIS — H2512 Age-related nuclear cataract, left eye: Secondary | ICD-10-CM | POA: Diagnosis not present

## 2016-03-18 DIAGNOSIS — Z7982 Long term (current) use of aspirin: Secondary | ICD-10-CM | POA: Diagnosis not present

## 2016-03-18 DIAGNOSIS — I4891 Unspecified atrial fibrillation: Secondary | ICD-10-CM | POA: Diagnosis not present

## 2016-03-18 DIAGNOSIS — E669 Obesity, unspecified: Secondary | ICD-10-CM | POA: Diagnosis not present

## 2016-03-18 DIAGNOSIS — Z6832 Body mass index (BMI) 32.0-32.9, adult: Secondary | ICD-10-CM | POA: Diagnosis not present

## 2016-03-18 DIAGNOSIS — M069 Rheumatoid arthritis, unspecified: Secondary | ICD-10-CM | POA: Diagnosis not present

## 2016-03-18 DIAGNOSIS — Z7901 Long term (current) use of anticoagulants: Secondary | ICD-10-CM | POA: Diagnosis not present

## 2016-03-18 DIAGNOSIS — J449 Chronic obstructive pulmonary disease, unspecified: Secondary | ICD-10-CM | POA: Diagnosis not present

## 2016-03-18 DIAGNOSIS — E785 Hyperlipidemia, unspecified: Secondary | ICD-10-CM | POA: Diagnosis not present

## 2016-03-18 DIAGNOSIS — K219 Gastro-esophageal reflux disease without esophagitis: Secondary | ICD-10-CM | POA: Diagnosis not present

## 2016-03-25 ENCOUNTER — Other Ambulatory Visit: Payer: Self-pay | Admitting: *Deleted

## 2016-03-25 NOTE — Telephone Encounter (Signed)
Patient requested a medication refill for lyrica. [facility]acy [facility]

## 2016-03-25 NOTE — Telephone Encounter (Signed)
Pt is requesting a refill on Lyrica but according to the last OV 02/15/16 she felt is was not helpful and  wanted  to stop because she feels it causes her to gain weight  According to our records she has not gained weight . I called the pt to see what she is taking and she states that she has not taken the cymbalta 20mg , she has been taking Lyrica 100mg  BID. Please advise, thanks

## 2016-03-26 ENCOUNTER — Telehealth: Payer: Self-pay | Admitting: *Deleted

## 2016-03-26 MED ORDER — PREGABALIN 50 MG PO CAPS: 50.0000 mg | ORAL_CAPSULE | Freq: Two times a day (BID) | ORAL | Status: DC

## 2016-03-26 NOTE — Telephone Encounter (Signed)
Brandy Armstrong please advise at your earliest convenience. Thanks

## 2016-03-26 NOTE — Telephone Encounter (Signed)
Brandy Armstrong is out of the office, Can you respond to this rx refill? Please advise, thanks

## 2016-03-26 NOTE — Telephone Encounter (Signed)
I will only refill to 50 mg dose because that is what Morey Hummingbird prescribed

## 2016-03-26 NOTE — Telephone Encounter (Signed)
Patient requested a medication refill, for Lyrica she stated this medication would need a prior authorization. [facility]

## 2016-03-26 NOTE — Telephone Encounter (Signed)
Ralph Dowdy did not respond to this message about a rx refill. Please advise, thanks

## 2016-03-27 ENCOUNTER — Other Ambulatory Visit: Payer: Self-pay | Admitting: Nurse Practitioner

## 2016-03-27 NOTE — Telephone Encounter (Signed)
Dr. Derrel Nip approved Lyrica, the script was printed to be faxed over to the pharmacy. I called the pharmacy about a PA and they stated that there was not script yet and once it was faxed over they would send over a form for PA.

## 2016-03-27 NOTE — Telephone Encounter (Signed)
Notified pt. 

## 2016-03-27 NOTE — Telephone Encounter (Signed)
Pt called to get the status of her medication refill for Lyrica. Call pt @ [phone]. Thank you!

## 2016-03-29 ENCOUNTER — Other Ambulatory Visit: Payer: Self-pay

## 2016-03-29 NOTE — Telephone Encounter (Signed)
Please advise refill? thanks 

## 2016-03-30 ENCOUNTER — Other Ambulatory Visit: Payer: Self-pay | Admitting: Nurse Practitioner

## 2016-04-01 ENCOUNTER — Ambulatory Visit: Payer: Medicare Other | Admitting: Neurology

## 2016-04-01 MED ORDER — ESCITALOPRAM OXALATE 10 MG PO TABS: 10.0000 mg | ORAL_TABLET | Freq: Every day | ORAL | Status: DC

## 2016-04-04 ENCOUNTER — Encounter: Payer: Self-pay | Admitting: Nurse Practitioner

## 2016-04-04 ENCOUNTER — Ambulatory Visit (INDEPENDENT_AMBULATORY_CARE_PROVIDER_SITE_OTHER): Payer: Medicare Other | Admitting: Nurse Practitioner

## 2016-04-04 VITALS — BP 116/64 | HR 94 | Temp 97.9°F | Ht 66.0 in | Wt 206.8 lb

## 2016-04-04 DIAGNOSIS — R202 Paresthesia of skin: Secondary | ICD-10-CM | POA: Diagnosis not present

## 2016-04-04 DIAGNOSIS — J438 Other emphysema: Secondary | ICD-10-CM

## 2016-04-04 DIAGNOSIS — R200 Anesthesia of skin: Secondary | ICD-10-CM

## 2016-04-04 MED ORDER — PREDNISONE 10 MG PO TABS: ORAL_TABLET | ORAL | Status: DC

## 2016-04-04 MED ORDER — PREGABALIN 100 MG PO CAPS: 100.0000 mg | ORAL_CAPSULE | Freq: Two times a day (BID) | ORAL | Status: DC

## 2016-04-04 NOTE — Patient Instructions (Signed)
Good to see you!   You have another refill on your Clonazepam

## 2016-04-04 NOTE — Progress Notes (Signed)
Patient ID: Brandy Armstrong, female    DOB: 02/05/1946  Age: 70 y.o. MRN: [id number]  CC: Medication Consultation   HPI Brandy Armstrong presents for medication consultation and CC of cough.   1) Pt has a cough that is worsening from COPD. Pt reports drainage and denies fevers/chills/sweats. Pt reports slight congestion. Denies sick contacts. Denies treatment to date.  2) Pt reports numbness is not as helped by the 50 mg twice daily of lyrica. She would like to restart the 100 mg dosage. Requesting Clonazepam- has another refill.  History Brandy Armstrong has a past medical history of Rheumatoid arthritis ([place]); GERD (gastroesophageal reflux disease); Atrial fibrillation ([place]); Urinary incontinence; Colon polyps; Depression with anxiety; Barretts esophagus; Rectal fistula; Vitamin D deficiency; Chest pain; COPD (chronic obstructive pulmonary disease) ([facility]); COPD (chronic obstructive pulmonary disease) ([place]); and Neuropathy ([place]).   She has past surgical history that includes Cholecystectomy (1987); Abdominal hysterectomy (1992); oophrectomy (Bilateral, 1992); Cardiac electrophysiology study and ablation (2013); Esophagogastroduodenoscopy (N/A, 05/06/2015); and Colonoscopy (N/A, 05/08/2015).   Her family history includes Cancer (age of onset: 40) in her sister; Cancer (age of onset: 62) in her father; Cancer (age of onset: 45) in her mother; Depression in her mother; Diabetes in her brother. There is no history of Neuropathy.She reports that she quit smoking about 4 years ago. She has never used smokeless tobacco. She reports that she does not drink alcohol or use illicit drugs.  Outpatient Prescriptions Prior to Visit  Medication Sig Dispense Refill  . Apixaban (ELIQUIS PO) Take by mouth 2 (two) times daily.    . budesonide-formoterol (SYMBICORT) 160-4.5 MCG/ACT inhaler Inhale into the lungs.    . clonazePAM (KLONOPIN) 0.5 MG tablet TAKE 1 TABLET BY MOUTH 3 TIMES DAILY AS NEEDED FOR ANXIETY 90 tablet 1  .  cyclobenzaprine (FLEXERIL) 10 MG tablet Take 1 tablet (10 mg total) by mouth 3 (three) times daily as needed for muscle spasms. 30 tablet 0  . esomeprazole (NEXIUM) 40 MG capsule TAKE ONE CAPSULE BY MOUTH DAILY AT 12 NOON 30 capsule 3  . hydroxychloroquine (PLAQUENIL) 200 MG tablet Take 200 mg by mouth 2 (two) times daily.    [name] leflunomide (ARAVA) 20 MG tablet Take 20 mg by mouth daily.    [name] levalbuterol (XOPENEX HFA) 45 MCG/ACT inhaler Inhale 2 puffs into the lungs every 4 (four) hours as needed for wheezing.    . metoprolol succinate (TOPROL-XL) 25 MG 24 hr tablet   6  . tiotropium (SPIRIVA) 18 MCG inhalation capsule Place 18 mcg into inhaler and inhale daily. Reported on 02/15/2016    . amiodarone (PACERONE) 200 MG tablet Take 1 tablet (200 mg total) by mouth 2 (two) times daily. (Patient taking differently: Take 200 mg by mouth daily. ) 60 tablet 1  . escitalopram (LEXAPRO) 10 MG tablet TAKE 1 TABLET BY MOUTH ONCE A DAY 30 tablet 1  . pregabalin (LYRICA) 50 MG capsule Take 1 capsule (50 mg total) by mouth 2 (two) times daily. 60 capsule 0  . b complex vitamins tablet Take 1 tablet by mouth daily. Reported on 02/15/2016    . cholecalciferol (VITAMIN D) 400 UNITS TABS tablet Take 400 Units by mouth. Reported on 02/15/2016    . cholestyramine (QUESTRAN) 4 G packet Take 1 packet by mouth as needed.    . DULoxetine (CYMBALTA) 20 MG capsule Take 1 capsule (20 mg total) by mouth daily. 30 capsule 0  . escitalopram (LEXAPRO) 10 MG tablet Take 1 tablet (10 mg total)  by mouth daily. 90 tablet 0  . guaiFENesin-codeine 100-10 MG/5ML syrup Take 5 mLs by mouth every 4 (four) hours as needed for cough. 120 mL 2  . lidocaine (XYLOCAINE) 5 % ointment Apply 1 application topically as needed. 35.44 g 0  . Respiratory Therapy Supplies (FLUTTER) DEVI Use as directed. 1 each 0   No facility-administered medications prior to visit.    ROS Review of Systems  Constitutional: Negative for fever, chills, diaphoresis  and fatigue.  HENT: Positive for congestion, postnasal drip, rhinorrhea and sinus pressure. Negative for sneezing, sore throat, trouble swallowing and voice change.   Respiratory: Negative for chest tightness, shortness of breath and wheezing.   Cardiovascular: Negative for chest pain, palpitations and leg swelling.  Gastrointestinal: Negative for nausea, vomiting and diarrhea.  Skin: Negative for rash.  Neurological: Positive for numbness. Negative for dizziness, weakness and headaches.  Psychiatric/Behavioral: The patient is not nervous/anxious.     Objective:  BP 116/64 mmHg  Pulse 94  Temp(Src) 97.9 F (36.6 C) (Oral)  Ht 5\' 6"  (1.676 m)  Wt 206 lb 12 oz (93.781 kg)  BMI 33.39 kg/m2  SpO2 95%  Physical Exam  Constitutional: She is oriented to person, place, and time. She appears well-developed and well-nourished. No distress.  HENT:  Head: Normocephalic and atraumatic.  Right Ear: External ear normal.  Left Ear: External ear normal.  Mouth/Throat: Oropharynx is clear and moist. No oropharyngeal exudate.  TMs clear bilaterally  Eyes: EOM are normal. Pupils are equal, round, and reactive to light. Right eye exhibits no discharge. Left eye exhibits no discharge. No scleral icterus.  Cardiovascular: Normal rate, regular rhythm and normal heart sounds.   Pulmonary/Chest: Effort normal. No respiratory distress. She has wheezes. She has no rales. She exhibits no tenderness.  Neurological: She is alert and oriented to person, place, and time. No cranial nerve deficit. She exhibits normal muscle tone. Coordination normal.  Skin: Skin is warm and dry. No rash noted. She is not diaphoretic.  Psychiatric: She has a normal mood and affect. Her behavior is normal. Judgment and thought content normal.   Assessment & Plan:   Brandy Armstrong was seen today for medication consultation.  Diagnoses and all orders for this visit:  Other emphysema ([place])  Numbness and tingling  Other orders -      predniSONE (DELTASONE) 10 MG tablet; Take 6 tablets by mouth on day 1 with breakfast then decrease by 1 tablet each day until gone. -     pregabalin (LYRICA) 100 MG capsule; Take 1 capsule (100 mg total) by mouth 2 (two) times daily.   I have discontinued Ms. Macho's cholestyramine, FLUTTER, b complex vitamins, cholecalciferol, guaiFENesin-codeine, lidocaine, DULoxetine, pregabalin, and escitalopram. I am also having her start on predniSONE and pregabalin. Additionally, I am having her maintain her levalbuterol, hydroxychloroquine, leflunomide, metoprolol succinate, tiotropium, Apixaban (ELIQUIS PO), budesonide-formoterol, esomeprazole, cyclobenzaprine, clonazePAM, and amiodarone.  Meds ordered this encounter  Medications  . predniSONE (DELTASONE) 10 MG tablet    Sig: Take 6 tablets by mouth on day 1 with breakfast then decrease by 1 tablet each day until gone.    Dispense:  21 tablet    Refill:  0    Order Specific Question:  Supervising Provider    Answer:  Deborra Medina L [[id number]]  . pregabalin (LYRICA) 100 MG capsule    Sig: Take 1 capsule (100 mg total) by mouth 2 (two) times daily.    Dispense:  60 capsule    Refill:  2  Order Specific Question:  Supervising Provider    Answer:  Crecencio Mc [[id number]]  . amiodarone (PACERONE) 200 MG tablet    Sig: Take 200 mg by mouth daily.     Follow-up: Return in about 3 months (around 07/04/2016) for Follow up.

## 2016-04-04 NOTE — Progress Notes (Signed)
Pre visit review using our clinic review tool, if applicable. No additional management support is needed unless otherwise documented below in the visit note. 

## 2016-04-05 ENCOUNTER — Other Ambulatory Visit: Payer: Self-pay

## 2016-04-05 MED ORDER — ESCITALOPRAM OXALATE 10 MG PO TABS
10.0000 mg | ORAL_TABLET | Freq: Every day | ORAL | Status: DC
Start: 2016-04-05 — End: 2017-01-10

## 2016-04-09 NOTE — Assessment & Plan Note (Signed)
Pt would like to return to her 100 mg twice daily dosage of Lyrica. Printed script and sent with pt to take to pharmacy. FU in 3 months.

## 2016-04-09 NOTE — Assessment & Plan Note (Signed)
Exacerbation Will treat conservatively due to probable viral nature Prednisone taper to decrease inflammation given  Instructions for taking done verbally and on AVS FU prn worsening/failure to improve.

## 2016-04-22 DIAGNOSIS — M069 Rheumatoid arthritis, unspecified: Secondary | ICD-10-CM | POA: Diagnosis not present

## 2016-04-22 DIAGNOSIS — M180 Bilateral primary osteoarthritis of first carpometacarpal joints: Secondary | ICD-10-CM | POA: Diagnosis not present

## 2016-04-22 DIAGNOSIS — M0579 Rheumatoid arthritis with rheumatoid factor of multiple sites without organ or systems involvement: Secondary | ICD-10-CM | POA: Diagnosis not present

## 2016-05-04 ENCOUNTER — Other Ambulatory Visit: Payer: Self-pay | Admitting: Nurse Practitioner

## 2016-05-09 ENCOUNTER — Other Ambulatory Visit: Payer: Self-pay | Admitting: Nurse Practitioner

## 2016-05-09 MED ORDER — CYCLOBENZAPRINE HCL 10 MG PO TABS: 10.0000 mg | ORAL_TABLET | Freq: Three times a day (TID) | ORAL | Status: DC | PRN

## 2016-05-09 NOTE — Telephone Encounter (Signed)
Please advise refill, thanks 

## 2016-05-09 NOTE — Telephone Encounter (Signed)
cyclobenzaprine (FLEXERIL) 10 MG tablet

## 2016-05-14 ENCOUNTER — Other Ambulatory Visit: Payer: Self-pay

## 2016-05-14 NOTE — Telephone Encounter (Signed)
I am not aware of any correlation between Flexeril and A. Fib. Patient to be seen regarding refill.

## 2016-05-14 NOTE — Telephone Encounter (Signed)
[facility] called and is wanting to know if the patient should remain on this medication as she was previously in afib and has been in [place] for the past year. This has a potential to make that happen? Please advise, I see she is following with you next week. thanks

## 2016-05-20 ENCOUNTER — Ambulatory Visit (INDEPENDENT_AMBULATORY_CARE_PROVIDER_SITE_OTHER): Payer: Medicare Other | Admitting: Family Medicine

## 2016-05-20 VITALS — BP 128/80 | HR 92 | Temp 98.2°F | Wt 210.6 lb

## 2016-05-20 DIAGNOSIS — G629 Polyneuropathy, unspecified: Secondary | ICD-10-CM

## 2016-05-20 DIAGNOSIS — M549 Dorsalgia, unspecified: Secondary | ICD-10-CM

## 2016-05-20 DIAGNOSIS — G8929 Other chronic pain: Secondary | ICD-10-CM

## 2016-05-20 DIAGNOSIS — M546 Pain in thoracic spine: Secondary | ICD-10-CM | POA: Insufficient documentation

## 2016-05-20 MED ORDER — CYCLOBENZAPRINE HCL 10 MG PO TABS: 10.0000 mg | ORAL_TABLET | Freq: Three times a day (TID) | ORAL | Status: DC | PRN

## 2016-05-20 MED ORDER — PREGABALIN 100 MG PO CAPS: 100.0000 mg | ORAL_CAPSULE | Freq: Three times a day (TID) | ORAL | Status: DC | PRN

## 2016-05-20 NOTE — Patient Instructions (Signed)
Continue the lyrica and flexeril.  Consider Physical therapy.  Consider seeing neurology again.  Follow up in 6 months or sooner if needed.  Take care  Dr. Lacinda Axon

## 2016-05-20 NOTE — Assessment & Plan Note (Addendum)
Stable. Continue lyrica. Weight gain multifactorial. Advised follow up with neurology if desired.

## 2016-05-20 NOTE — Progress Notes (Signed)
Pre visit review using our clinic review tool, if applicable. No additional management support is needed unless otherwise documented below in the visit note. 

## 2016-05-20 NOTE — Progress Notes (Signed)
Subjective:  Patient ID: Brandy Armstrong, female    DOB: 01-18-1946  Age: 70 y.o. MRN: [id number]  CC: Follow up neuropathy, back pain  HPI:  70 year old female presents for followup.  Neuropathy  Reports improvement/stability with lyrica.  However, she feels it is causing weight gain.  Previously saw neurology and has not followed up.  Back pain  Chronic.  Currently reporting lower thoracic pain.  Report pain is intermittently severe.  "Catches" with certain movements.   Improves with flexeril.  She is requesting refill on flexeril today.   Social Hx   Social History   Social History  . Marital Status: Single    Spouse Name: N/A  . Number of Children: 0  . Years of Education: 16   Occupational History  . Retired    Social History Main Topics  . Smoking status: Former Smoker -- 1.00 packs/day for 40 years    Quit date: 12/16/2011  . Smokeless tobacco: Never Used  . Alcohol Use: No     Comment: Occasionally has a drink  . Drug Use: No  . Sexual Activity: Yes   Other Topics Concern  . Not on file   Social History Narrative   Brandy Armstrong was born and reared in [place]. She attended [employer] and graduated in 1971 with her Brandy Armstrong in Education. 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 She then worked for a [employer] in [place]. She is single. She is currently retired. She loves reading. She loves to spend time with her dog.       Caffeine use: 2 cups coffee per day   2 glasses iced tea/ day   Review of Systems  Constitutional:       Weight gain.  Musculoskeletal: Positive for back pain.    Objective:  BP 128/80 mmHg  Pulse 92  Temp(Src) 98.2 F (36.8 C) (Oral)  Wt 210 lb 9.6 oz (95.528 kg)  SpO2 95%  BP/Weight 05/20/2016 04/04/2016 [id number]  Systolic BP 0000000 [id number]  Diastolic BP 80 64 60  Wt. (Lbs) 210.6 206.75 206.5  BMI 34.01 33.39 39.04   Physical Exam  Constitutional: She  is oriented to person, place, and time. She appears well-developed. No distress.  Cardiovascular: Normal rate and regular rhythm.   Pulmonary/Chest: Effort normal. She has no wheezes. She has no rales.  Musculoskeletal:  Thoracic spine - nontender to palpation.  Neurological: She is alert and oriented to person, place, and time.  Psychiatric:  Flat affect.  Vitals reviewed.   Lab Results  Component Value Date   WBC 5.7 06/14/2015   HGB 9.3* 06/14/2015   HCT 30.0* 06/14/2015   PLT 215.0 06/14/2015   GLUCOSE 74 01/02/2016   CHOL 160 08/18/2013   TRIG 138.0 08/18/2013   HDL 32.10* 08/18/2013   LDLCALC 100* 08/18/2013   ALT 12 01/02/2016   AST 18 01/02/2016   NA 141 01/02/2016   K 4.5 01/02/2016   CL 100 01/02/2016   CREATININE 0.91 01/02/2016   BUN 12 01/02/2016   CO2 22 01/02/2016   TSH 2.640 01/02/2016   INR 1.27 05/06/2015   HGBA1C 6.0 10/05/2015    Assessment & Plan:   Problem List Items Addressed This Visit    Neuropathy ([place]) - Primary    Stable. Continue lyrica. Weight gain multifactorial. Advised follow up with neurology if desired.      Chronic back pain  Appears to be MSK. Advised to consider PT. Patient declined as she can't afford to. Continue flexeril. Reordered today.      Relevant Medications   cyclobenzaprine (FLEXERIL) 10 MG tablet      Meds ordered this encounter  Medications  . albuterol (PROVENTIL HFA;VENTOLIN HFA) 108 (90 Base) MCG/ACT inhaler    Sig: Inhale into the lungs.  . cyclobenzaprine (FLEXERIL) 10 MG tablet    Sig: Take 1 tablet (10 mg total) by mouth 3 (three) times daily as needed for muscle spasms.    Dispense:  30 tablet    Refill:  3  . pregabalin (LYRICA) 100 MG capsule    Sig: Take 1 capsule (100 mg total) by mouth 3 (three) times daily as needed.    Dispense:  90 capsule    Refill:  3   Follow-up: 6 months.  [place]

## 2016-05-20 NOTE — Assessment & Plan Note (Signed)
Appears to be MSK. Advised to consider PT. Patient declined as she can't afford to. Continue flexeril. Reordered today.

## 2016-06-19 ENCOUNTER — Telehealth: Payer: Self-pay | Admitting: Family Medicine

## 2016-06-19 ENCOUNTER — Other Ambulatory Visit: Payer: Self-pay

## 2016-06-19 DIAGNOSIS — Z1239 Encounter for other screening for malignant neoplasm of breast: Secondary | ICD-10-CM

## 2016-06-19 NOTE — Telephone Encounter (Signed)
Pt advised that mammogram orders were placed.

## 2016-06-19 NOTE — Telephone Encounter (Signed)
fyi

## 2016-06-19 NOTE — Progress Notes (Signed)
Dr.Cook gave the okay to order mammogram.

## 2016-06-19 NOTE — Telephone Encounter (Signed)
You can order.

## 2016-06-19 NOTE — Telephone Encounter (Signed)
Do you want to see her first or just go ahead with orders?

## 2016-06-19 NOTE — Telephone Encounter (Signed)
Pt called needing a referral to [facility] for a mamo. Thank you!

## 2016-06-20 ENCOUNTER — Telehealth: Payer: Self-pay | Admitting: Neurology

## 2016-06-20 NOTE — Telephone Encounter (Signed)
Patient is calling and states she is rethinking the possibility of having the NCV and EMG test and would like to discuss.  She also had blood work done in 01/2016 and is wondering if she will have to have this done over.  Please call.

## 2016-06-20 NOTE — Telephone Encounter (Signed)
[facility] and discussed EMG/NCS. Advised it is an hour long test. It will test specific nerves/muscles to see if there is an abnormality or if they are functioning properly. Edwena Felty is our tech who does the first half of the test and Dr Jaynee Eagles does the second half. Pt was worried about how painful procedure would be. I reassured pt. Pt wanted to know how many pokes she would received. Advised pt I cannot give her a definitive answer because the amount of nerves/muscles tested are decided by Dr Jaynee Eagles at time of appt. Pt would like to go ahead and get test scheduled. Advised I will send to our schedulers to call her and get set up for test. Advised pt that we are booked out a little bit to get scheduled. I will have them call her this week, if not by beginning of next week. She verbalized understanding.   Also advised per Dr Jaynee Eagles that she does not need to come for any repeat labs. She verbalized understanding.

## 2016-06-21 ENCOUNTER — Other Ambulatory Visit: Payer: Self-pay | Admitting: Family Medicine

## 2016-06-21 NOTE — Telephone Encounter (Signed)
Refill request for Prednisone, last seen [id number], last filled [phone].  Please advise.

## 2016-06-25 ENCOUNTER — Other Ambulatory Visit: Payer: Self-pay | Admitting: Neurology

## 2016-06-26 ENCOUNTER — Other Ambulatory Visit: Payer: Self-pay | Admitting: Neurology

## 2016-06-26 NOTE — Telephone Encounter (Signed)
Dr Jaynee Eagles- Looks like you prescribed this back in January. However it was d/c'd by someone in April stating pt had not taken in the last 30 days. Do you want to do a refill?

## 2016-07-04 ENCOUNTER — Telehealth: Payer: Self-pay | Admitting: Family Medicine

## 2016-07-04 ENCOUNTER — Ambulatory Visit
Admission: RE | Admit: 2016-07-04 | Discharge: 2016-07-04 | Disposition: A | Discharge status: Home / Self Care | Payer: Medicare Other | Source: Ambulatory Visit | Attending: Family Medicine | Admitting: Family Medicine

## 2016-07-04 DIAGNOSIS — Z1239 Encounter for other screening for malignant neoplasm of breast: Secondary | ICD-10-CM | POA: Diagnosis not present

## 2016-07-04 DIAGNOSIS — Z1231 Encounter for screening mammogram for malignant neoplasm of breast: Secondary | ICD-10-CM | POA: Diagnosis not present

## 2016-07-04 NOTE — Telephone Encounter (Signed)
Patient Name: Brandy Armstrong  DOB: 20-Jul-1946    Initial Comment Caller has a tremor in her hands, has progressively getting worse, can't hold a cup of coffee or a fork. Mouth is Dry. concerned about diabetes. A1C has been close to being high   Nurse Assessment  Nurse: Raphael Gibney, RN, Vanita Ingles Date/Time (Eastern Time): 07/04/2016 2:49:13 PM  Confirm and document reason for call. If symptomatic, describe symptoms. You must click the next button to save text entered. ---Caller states she has a tremor in her hands that has progressively gotten worse since yesterday . She has eaten. Could not hold her cup of coffee. Mouth is dry. A1C is close to being elevated.  Has the patient traveled out of the country within the last 30 days? ---Not Applicable  Does the patient have any new or worsening symptoms? ---Yes  Will a triage be completed? ---Yes  Related visit to physician within the last 2 weeks? ---No  Does the PT have any chronic conditions? (i.e. diabetes, asthma, etc.) ---Yes  List chronic conditions. ---neuropathy. COPD; RA; a fib; 2 cardiac ablations  Is this a behavioral health or substance abuse call? ---No     Guidelines    Guideline Title Affirmed Question Affirmed Notes  Muscle Jerks - Tics - Shudders Shuddering (trembling) occurs without an obvious cause    Final Disposition User   See PCP When Office is Open (within 3 days) Raphael Gibney, RN, Vanita Ingles    Comments  appt scheduled for 07/05/2016 at 2:30 pm with Dr. Einar Pheasant   Referrals  REFERRED TO PCP OFFICE   Disagree/Comply: Comply

## 2016-07-05 ENCOUNTER — Ambulatory Visit: Payer: Self-pay | Admitting: Internal Medicine

## 2016-07-05 ENCOUNTER — Ambulatory Visit (INDEPENDENT_AMBULATORY_CARE_PROVIDER_SITE_OTHER): Payer: Medicare Other | Admitting: Family

## 2016-07-05 ENCOUNTER — Encounter: Payer: Medicare Other | Admitting: Neurology

## 2016-07-05 ENCOUNTER — Encounter: Payer: Self-pay | Admitting: Family

## 2016-07-05 VITALS — BP 140/80 | HR 68 | Temp 98.7°F | Wt 212.0 lb

## 2016-07-05 DIAGNOSIS — R251 Tremor, unspecified: Secondary | ICD-10-CM

## 2016-07-05 NOTE — Patient Instructions (Addendum)
Topamax instructions from neurology - start with one 25 mg tablet at bedtime for couple days and then increase to 2 tablets for a total of 50 mg at bedtime and stay at that dose.   As we discussed, I would like for you to give Topamax and good go for tremors prior seeing neurology again. As we discussed, there are other medications on the market for tremors however with atrial fibrillation, the side effect profile are more undesirable.  Tremor A tremor is trembling or shaking that you cannot control. Most tremors affect the hands or arms. Tremors can also affect the head, vocal cords, face, and other parts of the body. There are many types of tremors. Common types include:   Essential tremor. These usually occur in people over the age of 55. It may run in families and can happen in otherwise healthy people.   Resting tremor. These occur when the muscles are at rest, such as when your hands are resting in your lap. People with Parkinson disease often have resting tremors.   Postural tremor. These occur when you try to hold a pose, such as keeping your hands outstretched.   Kinetic tremor. These occur during purposeful movement, such as trying to touch a finger to your nose.   Task-specific tremor. These may occur when you perform tasks such as handwriting, speaking, or standing.   Psychogenic tremor. These dramatically lessen or disappear when you are distracted. They can happen in people of all ages.  Some types of tremors have no known cause. Tremors can also be a symptom of nervous system problems (neurological disorders) that may occur with aging. Some tremors go away with treatment while others do not.  HOME CARE INSTRUCTIONS Watch your tremor for any changes. The following actions may help to lessen any discomfort you are feeling:  Take medicines only as directed by your health care provider.   Limit alcohol intake to no more than 1 drink per day for nonpregnant women and 2  drinks per day for men. One drink equals 12 oz of beer, 5 oz of wine, or 1 oz of hard liquor.  Do not use any tobacco products, including cigarettes, chewing tobacco, or electronic cigarettes. If you need help quitting, ask your health care provider.   Avoid extreme heat or cold.   Limit the amount of caffeine you consumeas directed by your health care provider.   Try to get 8 hours of sleep each night.  Find ways to manage your stress, such as meditation or yoga.  Keep all follow-up visits as directed by your health care provider. This is important. SEEK MEDICAL CARE IF:  You start having a tremor after starting a new medicine.  You have tremor with other symptoms such as:  Numbness.  Tingling.  Pain.  Weakness.  Your tremor gets worse.  Your tremor interferes with your day-to-day life.   This information is not intended to replace advice given to you by your health care provider. Make sure you discuss any questions you have with your health care provider.   Document Released: 12/06/2002 Document Revised: 01/06/2015 Document Reviewed: 06/13/2014 Elsevier Interactive Patient Education [employer].

## 2016-07-05 NOTE — Progress Notes (Signed)
Subjective:    Patient ID: Brandy Armstrong, female    DOB: 09-Mar-1946, 70 y.o.   MRN: [id number]   Brandy Armstrong is a 70 y.o. female who presents today for an acute visit.    HPI Comments: Patient here for evaluation of tremor, which is been chronic for one year or more. Waxes and wanes. Tends to be better in the morning. Unable to tell if worse with movement/intention. No triggers. Started topamax for a couple of days when seen by neurology several months ago however thinks it was a small dose and wouldn't help. Doesn't believe her coffee contributes as she has had the same amount for many years. No family history of tremors. Follows up with neurology in one month.     Per chart review, patient was seen in January by neurology for neuropathy. At that time she also had tremor in her hands. Past Medical History  Diagnosis Date  . Rheumatoid arthritis ([place])     On methotrexate and orencia  . GERD (gastroesophageal reflux disease)   . Atrial fibrillation [facility])     a. s/p ablation 01/2012 in [facility] by Dr. Boyd Kerbs;  b. On sotalol & Xarelto;  c. 02/2014 Echo: EF 50-55%, mild conc LVH, nl LA size/structure;  d. Recurrent afib 8/15 & 08/29/2014.  [name] Urinary incontinence   . Colon polyps   . Depression with anxiety   . Barretts esophagus   . Rectal fistula   . Vitamin D deficiency   . Chest pain     a. 02/2014 Myoview: Ef 50%, no ischemia.  [name] COPD (chronic obstructive pulmonary disease) ([place])   . COPD (chronic obstructive pulmonary disease) ([place])   . Neuropathy (HCC)    Allergies: Latex Current Outpatient Prescriptions on File Prior to Visit  Medication Sig Dispense Refill  . albuterol (PROVENTIL HFA;VENTOLIN HFA) 108 (90 Base) MCG/ACT inhaler Inhale into the lungs.    [name] amiodarone (PACERONE) 200 MG tablet Take 200 mg by mouth daily.    [name] Apixaban (ELIQUIS PO) Take by mouth 2 (two) times daily.    . budesonide-formoterol (SYMBICORT) 160-4.5 MCG/ACT inhaler Inhale into the lungs.    .  clonazePAM (KLONOPIN) 0.5 MG tablet TAKE 1 TABLET BY MOUTH 3 TIMES DAILY AS NEEDED FOR ANXIETY 90 tablet 1  . cyclobenzaprine (FLEXERIL) 10 MG tablet Take 1 tablet (10 mg total) by mouth 3 (three) times daily as needed for muscle spasms. 30 tablet 3  . escitalopram (LEXAPRO) 10 MG tablet Take 1 tablet (10 mg total) by mouth daily. 90 tablet 1  . esomeprazole (NEXIUM) 40 MG capsule TAKE ONE CAPSULE BY MOUTH DAILY AT 12 NOON 30 capsule 11  . hydroxychloroquine (PLAQUENIL) 200 MG tablet Take 200 mg by mouth 2 (two) times daily.    [name] leflunomide (ARAVA) 20 MG tablet Take 20 mg by mouth daily.    [name] levalbuterol (XOPENEX HFA) 45 MCG/ACT inhaler Inhale 2 puffs into the lungs every 4 (four) hours as needed for wheezing.    . lidocaine (XYLOCAINE) 5 % ointment APPLY 1 APPLICATION TOPICALLY AS NEEDED. 35.44 g 0  . metoprolol succinate (TOPROL-XL) 25 MG 24 hr tablet   6  . pregabalin (LYRICA) 100 MG capsule Take 1 capsule (100 mg total) by mouth 3 (three) times daily as needed. 90 capsule 3  . tiotropium (SPIRIVA) 18 MCG inhalation capsule Place 18 mcg into inhaler and inhale daily. Reported on 02/15/2016     No current facility-administered medications on file prior to  visit.    Social History  Substance Use Topics  . Smoking status: Former Smoker -- 1.00 packs/day for 40 years    Quit date: 12/16/2011  . Smokeless tobacco: Never Used  . Alcohol Use: No     Comment: Occasionally has a drink    Review of Systems  Constitutional: Negative for fever and chills.  Respiratory: Negative for cough.   Cardiovascular: Negative for chest pain and palpitations.  Gastrointestinal: Negative for nausea and vomiting.  Neurological: Positive for tremors and numbness (chronic). Negative for headaches.      Objective:    BP 140/80 mmHg  Pulse 68  Temp(Src) 98.7 F (37.1 C)  Wt 212 lb (96.163 kg)  SpO2 96%   Physical Exam  Constitutional: She appears well-developed and well-nourished.  HENT:    Mouth/Throat: Uvula is midline, oropharynx is clear and moist and mucous membranes are normal.  Eyes: Conjunctivae and EOM are normal. Pupils are equal, round, and reactive to light.  Fundus normal bilaterally.   Cardiovascular: Normal rate, regular rhythm, normal heart sounds and normal pulses.   Pulmonary/Chest: Effort normal and breath sounds normal. She has no wheezes. She has no rhonchi. She has no rales.  Neurological: She is alert. She has normal strength. No cranial nerve deficit or sensory deficit. She displays a negative Romberg sign.  Reflex Scores:      Bicep reflexes are 2+ on the right side and 2+ on the left side.      Patellar reflexes are 2+ on the right side and 2+ on the left side. Grip equal and strong bilateral upper extremities. Gait strong and steady.  Resting tremor, would disappear with intention and then would return.  Able to perform finger to nose , tremor noted during exam.   Skin: Skin is warm and dry.  Psychiatric: She has a normal mood and affect. Her speech is normal and behavior is normal. Thought content normal.  Vitals reviewed.      Assessment & Plan:  1. Tremors of nervous system Tremors are nonspecific and appear to be resting and with intention. Reassured by no other focal deficits seen on neurologic exam. Tremors appear to be stable when compared to when patient saw neurology in 12/2015. Per chart review,Normal CT head 10/2015. Recent B12 268 A1c 6 B1 145 TSH 2.6  I discussed with patient that there are alternative medications to treat tremors including propranolol and impramine. I have concern about adding these medications to her regimen.  however she is following with neurology now, I advised her strongly to start Topamax again to actually get the medication chance to work for tremors. She agreed with this plan as she really didn't try Topamax. She states she will try Topamax again follow-up with neurology.     I am having [name] maintain her  levalbuterol, hydroxychloroquine, leflunomide, metoprolol succinate, tiotropium, Apixaban (ELIQUIS PO), budesonide-formoterol, clonazePAM, escitalopram, amiodarone, esomeprazole, albuterol, cyclobenzaprine, pregabalin, and lidocaine.   No orders of the defined types were placed in this encounter.    Return precautions given.   Start medications as prescribed and explained to patient on After Visit Summary ( AVS). Risks, benefits, and alternatives of the medications and treatment plan prescribed today were discussed, and patient expressed understanding.   Education regarding symptom management and diagnosis given to patient.   Follow-up:Plan follow-up and return precautions given if any worsening symptoms or change in condition.   Continue to follow with Rubbie Battiest, NP for routine health maintenance.   Clarise Cruz  Randa Lynn and I agreed with plan.   Mable Paris, FNP

## 2016-07-08 ENCOUNTER — Encounter: Payer: Self-pay | Admitting: Family

## 2016-07-08 ENCOUNTER — Ambulatory Visit (INDEPENDENT_AMBULATORY_CARE_PROVIDER_SITE_OTHER): Payer: Medicare Other | Admitting: Family

## 2016-07-08 VITALS — BP 100/60 | HR 77 | Temp 98.0°F | Ht 66.5 in | Wt 212.0 lb

## 2016-07-08 DIAGNOSIS — J029 Acute pharyngitis, unspecified: Secondary | ICD-10-CM | POA: Diagnosis not present

## 2016-07-08 LAB — POCT RAPID STREP A

## 2016-07-08 MED ORDER — AMOXICILLIN 500 MG PO TABS: ORAL_TABLET | ORAL | Status: DC

## 2016-07-08 NOTE — Progress Notes (Signed)
Pre visit review using our clinic review tool, if applicable. No additional management support is needed unless otherwise documented below in the visit note. 

## 2016-07-08 NOTE — Patient Instructions (Addendum)
Suspect tonsillar stone on right side. Will treat empirically for strep infection. If stone does not resolve on it's own , we will send to ENT.   If there is no improvement in your symptoms, or if there is any worsening of symptoms, or if you have any additional concerns, please return for re-evaluation; or, if we are closed, consider going to the Emergency Room for evaluation if symptoms urgent.   Sore Throat A sore throat is pain, burning, irritation, or scratchiness of the throat. There is often pain or tenderness when swallowing or talking. A sore throat may be accompanied by other symptoms, such as coughing, sneezing, fever, and swollen neck glands. A sore throat is often the first sign of another sickness, such as a cold, flu, strep throat, or mononucleosis (commonly known as mono). Most sore throats go away without medical treatment. CAUSES  The most common causes of a sore throat include:  A viral infection, such as a cold, flu, or mono.  A bacterial infection, such as strep throat, tonsillitis, or whooping cough.  Seasonal allergies.  Dryness in the air.  Irritants, such as smoke or pollution.  Gastroesophageal reflux disease (GERD). HOME CARE INSTRUCTIONS   Only take over-the-counter medicines as directed by your caregiver.  Drink enough fluids to keep your urine clear or pale yellow.  Rest as needed.  Try using throat sprays, lozenges, or sucking on hard candy to ease any pain (if older than 4 years or as directed).  Sip warm liquids, such as broth, herbal tea, or warm water with honey to relieve pain temporarily. You may also eat or drink cold or frozen liquids such as frozen ice pops.  Gargle with salt water (mix 1 tsp salt with 8 oz of water).  Do not smoke and avoid secondhand smoke.  Put a cool-mist humidifier in your bedroom at night to moisten the air. You can also turn on a hot shower and sit in the bathroom with the door closed for 5-10 minutes. SEEK  IMMEDIATE MEDICAL CARE IF:  You have difficulty breathing.  You are unable to swallow fluids, soft foods, or your saliva.  You have increased swelling in the throat.  Your sore throat does not get better in 7 days.  You have nausea and vomiting.  You have a fever or persistent symptoms for more than 2-3 days.  You have a fever and your symptoms suddenly get worse. MAKE SURE YOU:   Understand these instructions.  Will watch your condition.  Will get help right away if you are not doing well or get worse.   This information is not intended to replace advice given to you by your health care provider. Make sure you discuss any questions you have with your health care provider.   Document Released: 01/23/2005 Document Revised: 01/06/2015 Document Reviewed: 08/23/2012 Elsevier Interactive Patient Education [employer].

## 2016-07-08 NOTE — Progress Notes (Signed)
Subjective:    Patient ID: Brandy Armstrong, female    DOB: [birth year], 70 y.o.   MRN: [id number]   Brandy Armstrong is a 70 y.o. female who presents today for an acute visit.    HPI Comments: Patient here for evaluation of right sided sore throat for past couple of days. She noticed white bump on right tonsil which made her concerned. Endorses hoarseness, dry mouth, pain with swallowing.  No trouble swallowing, fevers, cough, SOB, drooling. Hasnt tried any medication.    H/o GERD. On nexium.   Note, I saw this patient last week for tremors. Since then she has scheduled EMG study with her neurologist.   Past Medical History  Diagnosis Date  . Rheumatoid arthritis ([place])     On methotrexate and orencia  . GERD (gastroesophageal reflux disease)   . Atrial fibrillation [facility])     a. s/p ablation 01/2012 in [facility] by Dr. Boyd Kerbs;  b. On sotalol & Xarelto;  c. 02/2014 Echo: EF 50-55%, mild conc LVH, nl LA size/structure;  d. Recurrent afib 8/15 & 08/29/2014.  Brandy Armstrong Urinary incontinence   . Colon polyps   . Depression with anxiety   . Barretts esophagus   . Rectal fistula   . Vitamin D deficiency   . Chest pain     a. 02/2014 Myoview: Ef 50%, no ischemia.  Brandy Armstrong COPD (chronic obstructive pulmonary disease) ([place])   . COPD (chronic obstructive pulmonary disease) ([place])   . Neuropathy (HCC)    Allergies: Latex Current Outpatient Prescriptions on File Prior to Visit  Medication Sig Dispense Refill  . albuterol (PROVENTIL HFA;VENTOLIN HFA) 108 (90 Base) MCG/ACT inhaler Inhale into the lungs.    Brandy Armstrong amiodarone (PACERONE) 200 MG tablet Take 200 mg by mouth daily.    . budesonide-formoterol (SYMBICORT) 160-4.5 MCG/ACT inhaler Inhale into the lungs.    . clonazePAM (KLONOPIN) 0.5 MG tablet TAKE 1 TABLET BY MOUTH 3 TIMES DAILY AS NEEDED FOR ANXIETY 90 tablet 1  . cyclobenzaprine (FLEXERIL) 10 MG tablet Take 1 tablet (10 mg total) by mouth 3 (three) times daily as needed for muscle spasms. 30 tablet 3  .  escitalopram (LEXAPRO) 10 MG tablet Take 1 tablet (10 mg total) by mouth daily. 90 tablet 1  . esomeprazole (NEXIUM) 40 MG capsule TAKE ONE CAPSULE BY MOUTH DAILY AT 12 NOON 30 capsule 11  . leflunomide (ARAVA) 20 MG tablet Take 20 mg by mouth daily.    Brandy Armstrong levalbuterol (XOPENEX HFA) 45 MCG/ACT inhaler Inhale 2 puffs into the lungs every 4 (four) hours as needed for wheezing.    . lidocaine (XYLOCAINE) 5 % ointment APPLY 1 APPLICATION TOPICALLY AS NEEDED. 35.44 g 0  . metoprolol succinate (TOPROL-XL) 25 MG 24 hr tablet   6  . pregabalin (LYRICA) 100 MG capsule Take 1 capsule (100 mg total) by mouth 3 (three) times daily as needed. 90 capsule 3  . tiotropium (SPIRIVA) 18 MCG inhalation capsule Place 18 mcg into inhaler and inhale daily. Reported on 02/15/2016     No current facility-administered medications on file prior to visit.    Social History  Substance Use Topics  . Smoking status: Former Smoker -- 1.00 packs/day for 40 years    Quit date: 12/16/2011  . Smokeless tobacco: Never Used  . Alcohol Use: No     Comment: Occasionally has a drink    Review of Systems  Constitutional: Negative for fever and chills.  HENT: Positive for sore throat and voice  change. Negative for ear pain, postnasal drip, sinus pressure and trouble swallowing.   Respiratory: Negative for cough, shortness of breath and wheezing.   Cardiovascular: Negative for chest pain and palpitations.  Gastrointestinal: Negative for nausea and vomiting.      Objective:    BP 100/60 mmHg  Pulse 77  Temp(Src) 98 F (36.7 C)  Ht 5' 6.5" (1.689 m)  Wt 212 lb (96.163 kg)  BMI 33.71 kg/m2  SpO2 95%   Physical Exam  Constitutional: She appears well-developed and well-nourished.  HENT:  Head: Normocephalic and atraumatic.  Right Ear: Hearing, tympanic membrane, external ear and ear canal normal. No drainage, swelling or tenderness. No foreign bodies. Tympanic membrane is not erythematous and not bulging. No middle ear  effusion. No decreased hearing is noted.  Left Ear: Hearing, tympanic membrane, external ear and ear canal normal. No drainage, swelling or tenderness. No foreign bodies. Tympanic membrane is not erythematous and not bulging.  No middle ear effusion. No decreased hearing is noted.  Nose: Nose normal. No rhinorrhea. Right sinus exhibits no maxillary sinus tenderness and no frontal sinus tenderness. Left sinus exhibits no maxillary sinus tenderness and no frontal sinus tenderness.  Mouth/Throat: Uvula is midline, oropharynx is clear and moist and mucous membranes are normal. No trismus in the jaw. No uvula swelling. No oropharyngeal exudate, posterior oropharyngeal edema, posterior oropharyngeal erythema or tonsillar abscesses.    Eyes: Conjunctivae are normal.  Cardiovascular: Regular rhythm, normal heart sounds and normal pulses.   Pulmonary/Chest: Effort normal and breath sounds normal. She has no wheezes. She has no rhonchi. She has no rales.  Lymphadenopathy:       Head (right side): No submental, no submandibular, no tonsillar, no preauricular, no posterior auricular and no occipital adenopathy present.       Head (left side): No submental, no submandibular, no tonsillar, no preauricular, no posterior auricular and no occipital adenopathy present.    She has no cervical adenopathy.  Neurological: She is alert.  Skin: Skin is warm and dry.  Psychiatric: She has a normal mood and affect. Her speech is normal and behavior is normal. Thought content normal.  Vitals reviewed.  Strep negative.    Assessment & Plan:   1. Sore throat Viral pharyngitis and suspect right tonsillar stone versus tonsillar exudate. Will treat empirically for bacterial infection. Stone should be self limiting. No systemic features. If does not resolve on it's own, will send patient to ENT.  - amoxicillin (AMOXIL) 500 MG tablet; Take two tablets ( total 1000 mg) PO q24h for 10 days  Dispense: 20 tablet; Refill:  0    I have discontinued Ms. Stolz's Apixaban (ELIQUIS PO). I am also having her start on amoxicillin. Additionally, I am having her maintain her levalbuterol, leflunomide, metoprolol succinate, tiotropium, budesonide-formoterol, clonazePAM, escitalopram, amiodarone, esomeprazole, albuterol, cyclobenzaprine, pregabalin, lidocaine, SPIRIVA RESPIMAT, topiramate, ELIQUIS, and hydroxychloroquine.   Meds ordered this encounter  Medications  . SPIRIVA RESPIMAT 2.5 MCG/ACT AERS    Sig: INHALE 2 PUFFS BY MOUTH DAILY.    Refill:  6  . topiramate (TOPAMAX) 25 MG tablet    Sig:   . ELIQUIS 5 MG TABS tablet    Sig: TAKE 1 TABLET (5 MG TOTAL) BY MOUTH TWO (2) TIMES A DAY.    Refill:  6  . hydroxychloroquine (PLAQUENIL) 200 MG tablet    Sig: Take 200 mg by mouth.  Brandy Armstrong amoxicillin (AMOXIL) 500 MG tablet    Sig: Take two tablets ( total 1000 mg)  PO q24h for 10 days    Dispense:  20 tablet    Refill:  0    Order Specific Question:  Supervising Provider    Answer:  Cassandria Anger [[id number]]    Return precautions given.   Start medications as prescribed and explained to patient on After Visit Summary ( AVS). Risks, benefits, and alternatives of the medications and treatment plan prescribed today were discussed, and patient expressed understanding.   Education regarding symptom management and diagnosis given to patient.   Follow-up:Plan follow-up and return precautions given if any worsening symptoms or change in condition.   Continue to follow with Rubbie Battiest, NP for routine health maintenance.   [name] and I agreed with plan.   Mable Paris, FNP

## 2016-07-11 LAB — CULTURE, UPPER RESPIRATORY: Organism ID, Bacteria: NORMAL

## 2016-07-12 DIAGNOSIS — Z91040 Latex allergy status: Secondary | ICD-10-CM | POA: Diagnosis not present

## 2016-07-12 DIAGNOSIS — M069 Rheumatoid arthritis, unspecified: Secondary | ICD-10-CM | POA: Diagnosis not present

## 2016-07-12 DIAGNOSIS — K219 Gastro-esophageal reflux disease without esophagitis: Secondary | ICD-10-CM | POA: Diagnosis not present

## 2016-07-12 DIAGNOSIS — J449 Chronic obstructive pulmonary disease, unspecified: Secondary | ICD-10-CM | POA: Diagnosis not present

## 2016-07-12 DIAGNOSIS — I4891 Unspecified atrial fibrillation: Secondary | ICD-10-CM | POA: Diagnosis not present

## 2016-07-12 DIAGNOSIS — Z792 Long term (current) use of antibiotics: Secondary | ICD-10-CM | POA: Diagnosis not present

## 2016-07-12 DIAGNOSIS — Z7952 Long term (current) use of systemic steroids: Secondary | ICD-10-CM | POA: Diagnosis not present

## 2016-07-12 DIAGNOSIS — Z833 Family history of diabetes mellitus: Secondary | ICD-10-CM | POA: Diagnosis not present

## 2016-07-12 DIAGNOSIS — M1990 Unspecified osteoarthritis, unspecified site: Secondary | ICD-10-CM | POA: Diagnosis not present

## 2016-07-12 DIAGNOSIS — Z809 Family history of malignant neoplasm, unspecified: Secondary | ICD-10-CM | POA: Diagnosis not present

## 2016-07-12 DIAGNOSIS — I491 Atrial premature depolarization: Secondary | ICD-10-CM | POA: Diagnosis not present

## 2016-07-12 DIAGNOSIS — R9431 Abnormal electrocardiogram [ECG] [EKG]: Secondary | ICD-10-CM | POA: Diagnosis not present

## 2016-07-12 DIAGNOSIS — Z7901 Long term (current) use of anticoagulants: Secondary | ICD-10-CM | POA: Diagnosis not present

## 2016-07-12 DIAGNOSIS — Z7951 Long term (current) use of inhaled steroids: Secondary | ICD-10-CM | POA: Diagnosis not present

## 2016-07-12 DIAGNOSIS — Z79899 Other long term (current) drug therapy: Secondary | ICD-10-CM | POA: Diagnosis not present

## 2016-07-12 DIAGNOSIS — R42 Dizziness and giddiness: Secondary | ICD-10-CM | POA: Diagnosis not present

## 2016-07-24 ENCOUNTER — Ambulatory Visit (INDEPENDENT_AMBULATORY_CARE_PROVIDER_SITE_OTHER): Payer: Medicare Other | Admitting: Neurology

## 2016-07-24 ENCOUNTER — Ambulatory Visit (INDEPENDENT_AMBULATORY_CARE_PROVIDER_SITE_OTHER): Payer: Self-pay | Admitting: Neurology

## 2016-07-24 ENCOUNTER — Encounter: Payer: Medicare Other | Admitting: Neurology

## 2016-07-24 DIAGNOSIS — G608 Other hereditary and idiopathic neuropathies: Secondary | ICD-10-CM

## 2016-07-24 DIAGNOSIS — G603 Idiopathic progressive neuropathy: Secondary | ICD-10-CM

## 2016-07-24 DIAGNOSIS — G629 Polyneuropathy, unspecified: Secondary | ICD-10-CM

## 2016-07-24 DIAGNOSIS — Z0289 Encounter for other administrative examinations: Secondary | ICD-10-CM

## 2016-07-24 MED ORDER — NORTRIPTYLINE HCL 10 MG PO CAPS: 10.0000 mg | ORAL_CAPSULE | Freq: Every day | ORAL | 12 refills | Status: DC

## 2016-07-24 NOTE — Progress Notes (Signed)
  [facility]    Provider:  Dr Jaynee Eagles Referring Provider: Rubbie Battiest, NP Primary Care Physician:  Rubbie Battiest, NP  History:  Brandy Armstrong is a 70 y.o. female here as a referral from Dr. Flonnie Overman for  for neuropathy. She has a PMHx of afib,anxiety. She has numbness, tingling, burning in the feet. Also has symptoms in the hands and feet. Slowly progressive.She has several risk factors including elevated HgbA1c, Hx of B12 deficiency, Rheumatoid Arthritis and Amiodarone usage.  Summary  Nerve conduction studies were performed on the left upper and bilateral lower extremities:  The left Median motor nerve showed normal conductions with normal F Wave latency The left Ulnar motor nerve showed normal conductions with normal F Wave latency The bilateral Peroneal motor nerves showed normal conductions with normal F Wave latency The bilateral Tibial motor nerves showed normal conductions with normal F Wave latency The left second-digit Median sensory nerve was within normal limits The left fifth-digit Ulnar sensory nerve was within normal limits The bilateral Superficial Peroneal sensory nerves were within normal limits Bilateral H Reflexes showed normal latencies  EMG Needle study was performed on selected left lower extremity muscles:   The Vastus Medialis, Anterior Tibialis, Medial Gastrocnemius, Extensor Hallucis Longus and Abductor Hallucis muscle were within normal limits.   Conclusion: This is a normal study. No electrophysiologic evidence for ulnar or median neuropathy, large-fiber peripheral polyneuropathy or radiculopathy. Patient's symptoms could be explained by small fiber neuropathy and still evade detection by this exam.  Sarina Ill, MD  [facility] [address] [place],  [address]  Phone [phone] Fax [phone]

## 2016-07-24 NOTE — Patient Instructions (Signed)
Remember to drink plenty of fluid, eat healthy meals and do not skip any meals. Try to eat protein with a every meal and eat a healthy snack such as fruit or nuts in between meals. Try to keep a regular sleep-wake schedule and try to exercise daily, particularly in the form of walking, 20-30 minutes a day, if you can.   As far as your medications are concerned, I would like to suggest: Nortriptyline. Do not start using until you hear back from me, I will call cardiologist and ensure it is ok with him. thanks  I would like to see you back in 2-3 months, sooner if we need to. Please call us with any interim questions, concerns, problems, updates or refill requests.   Our phone number is [phone]. We also have an after hours call service for urgent matters and there is a physician on-call for urgent questions. For any emergencies you know to call 911 or go to the nearest emergency room   Nortriptyline capsules What is this medicine? NORTRIPTYLINE (nor TRIP ti leen) is used to treat depression. This medicine may be used for other purposes; ask your health care provider or pharmacist if you have questions. What should I tell my health care provider before I take this medicine? They need to know if you have any of these conditions: -an alcohol problem -bipolar disorder or schizophrenia -difficulty passing urine, prostate trouble -glaucoma -heart disease or recent heart attack -liver disease -over active thyroid -seizures -thoughts or plans of suicide or a previous suicide attempt or family history of suicide attempt -an unusual or allergic reaction to nortriptyline, other medicines, foods, dyes, or preservatives -pregnant or trying to get pregnant -breast-feeding How should I use this medicine? Take this medicine by mouth with a glass of water. Follow the directions on the prescription label. Take your doses at regular intervals. Do not take it more often than directed. Do not stop taking  this medicine suddenly except upon the advice of your doctor. Stopping this medicine too quickly may cause serious side effects or your condition may worsen. A special MedGuide will be given to you by the pharmacist with each prescription and refill. Be sure to read this information carefully each time. Talk to your pediatrician regarding the use of this medicine in children. Special care may be needed. Overdosage: If you think you have taken too much of this medicine contact a poison control center or emergency room at once. NOTE: This medicine is only for you. Do not share this medicine with others. What if I miss a dose? If you miss a dose, take it as soon as you can. If it is almost time for your next dose, take only that dose. Do not take double or extra doses. What may interact with this medicine? Do not take this medicine with any of the following medications: -arsenic trioxide -certain medicines medicines for irregular heart beat -cisapride -halofantrine -linezolid -MAOIs like Carbex, Eldepryl, Marplan, Nardil, and Parnate -methylene blue (injected into a vein) -other medicines for mental depression -phenothiazines like perphenazine, thioridazine and chlorpromazine -pimozide -probucol -procarbazine -sparfloxacin -St. John's Wort -ziprasidone This medicine may also interact with any of the following medications: -atropine and related drugs like hyoscyamine, scopolamine, tolterodine and others -barbiturate medicines for inducing sleep or treating seizures, such as phenobarbital -cimetidine -medicines for diabetes -medicines for seizures like carbamazepine or phenytoin -reserpine -thyroid medicine This list may not describe all possible interactions. Give your health care provider a list of all the  medicines, herbs, non-prescription drugs, or dietary supplements you use. Also tell them if you smoke, drink alcohol, or use illegal drugs. Some items may interact with your  medicine. What should I watch for while using this medicine? Tell your doctor if your symptoms do not get better or if they get worse. Visit your doctor or health care professional for regular checks on your progress. Because it may take several weeks to see the full effects of this medicine, it is important to continue your treatment as prescribed by your doctor. Patients and their families should watch out for new or worsening thoughts of suicide or depression. Also watch out for sudden changes in feelings such as feeling anxious, agitated, panicky, irritable, hostile, aggressive, impulsive, severely restless, overly excited and hyperactive, or not being able to sleep. If this happens, especially at the beginning of treatment or after a change in dose, call your health care professional. Dennis Bast may get drowsy or dizzy. Do not drive, use machinery, or do anything that needs mental alertness until you know how this medicine affects you. Do not stand or sit up quickly, especially if you are an older patient. This reduces the risk of dizzy or fainting spells. Alcohol may interfere with the effect of this medicine. Avoid alcoholic drinks. Do not treat yourself for coughs, colds, or allergies without asking your doctor or health care professional for advice. Some ingredients can increase possible side effects. Your mouth may get dry. Chewing sugarless gum or sucking hard candy, and drinking plenty of water may help. Contact your doctor if the problem does not go away or is severe. This medicine may cause dry eyes and blurred vision. If you wear contact lenses you may feel some discomfort. Lubricating drops may help. See your eye doctor if the problem does not go away or is severe. This medicine can cause constipation. Try to have a bowel movement at least every 2 to 3 days. If you do not have a bowel movement for 3 days, call your doctor or health care professional. This medicine can make you more sensitive to the  sun. Keep out of the sun. If you cannot avoid being in the sun, wear protective clothing and use sunscreen. Do not use sun lamps or tanning beds/booths. What side effects may I notice from receiving this medicine? Side effects that you should report to your doctor or health care professional as soon as possible: -allergic reactions like skin rash, itching or hives, swelling of the face, lips, or tongue -abnormal production of milk in females -breast enlargement in both males and females -breathing problems -confusion, hallucinations -fever with increased sweating -irregular or fast, pounding heartbeat -muscle stiffness, or spasms -pain or difficulty passing urine, loss of bladder control -seizures -suicidal thoughts or other mood changes -swelling of the testicles -tingling, pain, or numbness in the feet or hands -yellowing of the eyes or skin Side effects that usually do not require medical attention (report to your doctor or health care professional if they continue or are bothersome): -change in sex drive or performance -diarrhea -nausea, vomiting -weight gain or loss This list may not describe all possible side effects. Call your doctor for medical advice about side effects. You may report side effects to FDA at 1-800-FDA-1088. Where should I keep my medicine? Keep out of the reach of children. Store at room temperature between 15 and 30 degrees C (59 and 86 degrees F). Keep container tightly closed. Throw away any unused medicine after the expiration date. NOTE:  This sheet is a summary. It may not cover all possible information. If you have questions about this medicine, talk to your doctor, pharmacist, or health care provider.    2016, Elsevier/Gold Standard. (2012-05-04 13:57:12)

## 2016-07-25 NOTE — Progress Notes (Signed)
See procedure note.

## 2016-07-25 NOTE — Procedures (Signed)
[facility]    Provider:  Dr Jaynee Eagles Referring Provider: Rubbie Battiest, NP Primary Care Physician:  Rubbie Battiest, NP  History:  Brandy Armstrong is a 70 y.o. female here as a referral from Dr. Flonnie Overman for  for neuropathy. She has a PMHx of afib,anxiety. She has numbness, tingling, burning in the feet. Also has symptoms in the hands and feet. Slowly progressive.She has several risk factors including elevated HgbA1c, Hx of B12 deficiency, Rheumatoid Arthritis and Amiodarone usage.  Summary  Nerve conduction studies were performed on the left upper and bilateral lower extremities:  The left Median motor nerve showed normal conductions with normal F Wave latency The left Ulnar motor nerve showed normal conductions with normal F Wave latency The bilateral Peroneal motor nerves showed normal conductions with normal F Wave latency The bilateral Tibial motor nerves showed normal conductions with normal F Wave latency The left second-digit Median sensory nerve was within normal limits The left fifth-digit Ulnar sensory nerve was within normal limits The bilateral Superficial Peroneal sensory nerves were within normal limits Bilateral H Reflexes showed normal latencies  EMG Needle study was performed on selected left lower extremity muscles:   The Vastus Medialis, Anterior Tibialis, Medial Gastrocnemius, Extensor Hallucis Longus and Abductor Hallucis muscle were within normal limits.   Conclusion: This is a normal study. No electrophysiologic evidence for ulnar or median neuropathy, large-fiber peripheral polyneuropathy or radiculopathy. Patient's symptoms could be explained by small fiber neuropathy and still evade detection by this exam.  Sarina Ill, MD  [facility] [address] [place], [place] [address]  Phone [phone] Fax [phone]

## 2016-07-26 ENCOUNTER — Other Ambulatory Visit: Payer: Self-pay | Admitting: Nurse Practitioner

## 2016-07-31 NOTE — Progress Notes (Signed)
Reviewed

## 2016-08-01 ENCOUNTER — Telehealth: Payer: Self-pay | Admitting: *Deleted

## 2016-08-01 ENCOUNTER — Ambulatory Visit (INDEPENDENT_AMBULATORY_CARE_PROVIDER_SITE_OTHER): Payer: Medicare Other

## 2016-08-01 VITALS — BP 106/62 | HR 85 | Temp 97.4°F | Resp 14 | Ht 66.5 in | Wt 206.4 lb

## 2016-08-01 DIAGNOSIS — Z0000 Encounter for general adult medical examination without abnormal findings: Secondary | ICD-10-CM | POA: Diagnosis not present

## 2016-08-01 NOTE — Telephone Encounter (Signed)
Spoke with patient, she is having urinary possible retention issues, has drank approximately a 12oz bottle of water and two 20oz waters at lunch today and additional water at home and no urination.  She doesn't want to go to the ED, advised that she needs to be seen. Not happy about seeing a female provider, but after conversation was willing to see [place] tomorrow, thanks

## 2016-08-01 NOTE — Telephone Encounter (Signed)
Patient has had trouble with urination. She has drunk several cups of water only to void in trickles. She stated that she could not remember the last time she had a steady flow. She soul ike to be seen asap, however she do not want to see a female for this issue. She also stated that she stomach feels different but not painful.

## 2016-08-01 NOTE — Patient Instructions (Addendum)
Brandy Armstrong , Thank you for taking time to come for your Medicare Wellness Visit. I appreciate your ongoing commitment to your health goals. Please review the following plan we discussed and let me know if I can assist you in the future.   CHECK WITH PHARMACY TO VERIFY PREVNAR 13 VACCINE  BRING COPY OF HCPOA  RETURN NEXT WEEK FOR FOLLOW UP WITH MARGARET ARNETT, FNP  These are the goals we discussed: Goals    . Increase physical activity          Continue walking the dog and increase walking an additional 20 minutes, continue to increase as tolerated.     . Increase water intake          STAY HYDRATED! DRINK PLENTY OF WATER. INCREASE WATER INTAKE UP TO 4 CUPS DAILY.       This is a list of the screening recommended for you and due dates:  Health Maintenance  Topic Date Due  . Shingles Vaccine  08/07/2008  . Pneumonia vaccines (2 of 2 - PCV13) 04/16/2015  . Flu Shot  07/30/2016  . Mammogram  07/04/2018  . Tetanus Vaccine  08/09/2024  . Colon Cancer Screening  05/07/2025  . DEXA scan (bone density measurement)  Completed  .  Hepatitis C: One time screening is recommended by Center for Disease Control  (CDC) for  adults born from [birth year] through 1963.   Completed      Fall Prevention in the Home  Falls can cause injuries. They can happen to people of all ages. There are many things you can do to make your home safe and to help prevent falls.  WHAT CAN I DO ON THE OUTSIDE OF MY HOME?  Regularly fix the edges of walkways and driveways and fix any cracks.  Remove anything that might make you trip as you walk through a door, such as a raised step or threshold.  Trim any bushes or trees on the path to your home.  Use bright outdoor lighting.  Clear any walking paths of anything that might make someone trip, such as rocks or tools.  Regularly check to see if handrails are loose or broken. Make sure that both sides of any steps have handrails.  Any raised decks and porches  should have guardrails on the edges.  Have any leaves, snow, or ice cleared regularly.  Use sand or salt on walking paths during winter.  Clean up any spills in your garage right away. This includes oil or grease spills. WHAT CAN I DO IN THE BATHROOM?   Use night lights.  Install grab bars by the toilet and in the tub and shower. Do not use towel bars as grab bars.  Use non-skid mats or decals in the tub or shower.  If you need to sit down in the shower, use a plastic, non-slip stool.  Keep the floor dry. Clean up any water that spills on the floor as soon as it happens.  Remove soap buildup in the tub or shower regularly.  Attach bath mats securely with double-sided non-slip rug tape.  Do not have throw rugs and other things on the floor that can make you trip. WHAT CAN I DO IN THE BEDROOM?  Use night lights.  Make sure that you have a light by your bed that is easy to reach.  Do not use any sheets or blankets that are too big for your bed. They should not hang down onto the floor.  Have a firm  chair that has side arms. You can use this for support while you get dressed.  Do not have throw rugs and other things on the floor that can make you trip. WHAT CAN I DO IN THE KITCHEN?  Clean up any spills right away.  Avoid walking on wet floors.  Keep items that you use a lot in easy-to-reach places.  If you need to reach something above you, use a strong step stool that has a grab bar.  Keep electrical cords out of the way.  Do not use floor polish or wax that makes floors slippery. If you must use wax, use non-skid floor wax.  Do not have throw rugs and other things on the floor that can make you trip. WHAT CAN I DO WITH MY STAIRS?  Do not leave any items on the stairs.  Make sure that there are handrails on both sides of the stairs and use them. Fix handrails that are broken or loose. Make sure that handrails are as long as the stairways.  Check any carpeting to  make sure that it is firmly attached to the stairs. Fix any carpet that is loose or worn.  Avoid having throw rugs at the top or bottom of the stairs. If you do have throw rugs, attach them to the floor with carpet tape.  Make sure that you have a light switch at the top of the stairs and the bottom of the stairs. If you do not have them, ask someone to add them for you. WHAT ELSE CAN I DO TO HELP PREVENT FALLS?  Wear shoes that:  Do not have high heels.  Have rubber bottoms.  Are comfortable and fit you well.  Are closed at the toe. Do not wear sandals.  If you use a stepladder:  Make sure that it is fully opened. Do not climb a closed stepladder.  Make sure that both sides of the stepladder are locked into place.  Ask someone to hold it for you, if possible.  Clearly mark and make sure that you can see:  Any grab bars or handrails.  First and last steps.  Where the edge of each step is.  Use tools that help you move around (mobility aids) if they are needed. These include:  Canes.  Walkers.  Scooters.  Crutches.  Turn on the lights when you go into a dark area. Replace any light bulbs as soon as they burn out.  Set up your furniture so you have a clear path. Avoid moving your furniture around.  If any of your floors are uneven, fix them.  If there are any pets around you, be aware of where they are.  Review your medicines with your doctor. Some medicines can make you feel dizzy. This can increase your chance of falling. Ask your doctor what other things that you can do to help prevent falls.   This information is not intended to replace advice given to you by your health care provider. Make sure you discuss any questions you have with your health care provider.   Document Released: 10/12/2009 Document Revised: 05/02/2015 Document Reviewed: 01/20/2015 Elsevier Interactive Patient Education [employer].

## 2016-08-01 NOTE — Progress Notes (Signed)
Subjective:   Brandy Armstrong is a 70 y.o. female who presents for Medicare Annual (Subsequent) preventive examination.  Review of Systems:  No ROS.  Medicare Wellness Visit.  Cardiac Risk Factors include: advanced age (>38men, >40 women);obesity (BMI >30kg/m2)     Objective:     Vitals: BP 106/62 (BP Location: Right Arm, Patient Position: Sitting, Cuff Size: Normal)   Pulse 85   Temp 97.4 F (36.3 C) (Oral)   Resp 14   Ht 5' 6.5" (1.689 m)   Wt 206 lb 6.4 oz (93.6 kg)   SpO2 97%   BMI 32.81 kg/m   Body mass index is 32.81 kg/m.   Tobacco History  Smoking Status  . Former Smoker  . Packs/day: 1.00  . Years: 40.00  . Quit date: 12/16/2011  Smokeless Tobacco  . Never Used     Counseling given: Not Answered   Past Medical History:  Diagnosis Date  . Atrial fibrillation [facility])    a. s/p ablation 01/2012 in [facility] by Dr. Boyd Kerbs;  b. On sotalol & Xarelto;  c. 02/2014 Echo: EF 50-55%, mild conc LVH, nl LA size/structure;  d. Recurrent afib 8/15 & 08/29/2014.  . Barretts esophagus   . Chest pain    a. 02/2014 Myoview: Ef 50%, no ischemia.  . Colon polyps   . COPD (chronic obstructive pulmonary disease) ([place])   . COPD (chronic obstructive pulmonary disease) ([place])   . Depression with anxiety   . GERD (gastroesophageal reflux disease)   . Neuropathy ([place])   . Rectal fistula   . Rheumatoid arthritis ([place])    On methotrexate and orencia  . Urinary incontinence   . Vitamin D deficiency    Past Surgical History:  Procedure Laterality Date  . ABDOMINAL HYSTERECTOMY  1992  . CARDIAC ELECTROPHYSIOLOGY STUDY AND ABLATION  2013  . CHOLECYSTECTOMY  1987  . COLONOSCOPY N/A 05/08/2015   Procedure: COLONOSCOPY;  Surgeon: Manya Silvas, MD;  Location: [facility] ENDOSCOPY;  Service: Endoscopy;  Laterality: N/A;  . ESOPHAGOGASTRODUODENOSCOPY N/A 05/06/2015   Procedure: ESOPHAGOGASTRODUODENOSCOPY (EGD);  Surgeon: Lollie Sails, MD;  Location: [facility] ENDOSCOPY;  Service: Endoscopy;   Laterality: N/A;  . oophrectomy Bilateral 1992   Family History  Problem Relation Age of Onset  . Depression Mother   . Cancer Mother 53    pancreatic cancer  . Cancer Father 2    colon cancer  . Cancer Sister 30    breast cancer  . Breast cancer Sister 36  . Diabetes Brother   . Neuropathy Neg Hx    History  Sexual Activity  . Sexual activity: No    Outpatient Encounter Prescriptions as of 08/01/2016  Medication Sig  . albuterol (PROVENTIL HFA;VENTOLIN HFA) 108 (90 Base) MCG/ACT inhaler Inhale into the lungs.  [name] amiodarone (PACERONE) 200 MG tablet Take 200 mg by mouth daily.  [name] amoxicillin (AMOXIL) 500 MG tablet Take two tablets ( total 1000 mg) PO q24h for 10 days  . clonazePAM (KLONOPIN) 0.5 MG tablet TAKE 1 TABLET BY MOUTH 3 TIMES DAILY AS NEEDED FOR ANXIETY  . cyclobenzaprine (FLEXERIL) 10 MG tablet Take 1 tablet (10 mg total) by mouth 3 (three) times daily as needed for muscle spasms.  [name] ELIQUIS 5 MG TABS tablet TAKE 1 TABLET (5 MG TOTAL) BY MOUTH TWO (2) TIMES A DAY.  [name] escitalopram (LEXAPRO) 10 MG tablet Take 1 tablet (10 mg total) by mouth daily.  [name] esomeprazole (NEXIUM) 40 MG capsule TAKE ONE CAPSULE BY  MOUTH DAILY AT 12 NOON  . hydroxychloroquine (PLAQUENIL) 200 MG tablet Take 200 mg by mouth.  . leflunomide (ARAVA) 20 MG tablet Take 20 mg by mouth daily.  [name] lidocaine (XYLOCAINE) 5 % ointment APPLY 1 APPLICATION TOPICALLY AS NEEDED.  . metoprolol succinate (TOPROL-XL) 25 MG 24 hr tablet   . pregabalin (LYRICA) 100 MG capsule Take 1 capsule (100 mg total) by mouth 3 (three) times daily as needed.  [name] SPIRIVA RESPIMAT 2.5 MCG/ACT AERS INHALE 2 PUFFS BY MOUTH DAILY.  . SYMBICORT 160-4.5 MCG/ACT inhaler INHALE 2 PUFFS TWO (2) TIMES A DAY.  [name] topiramate (TOPAMAX) 25 MG tablet   . [DISCONTINUED] levalbuterol (XOPENEX HFA) 45 MCG/ACT inhaler Inhale 2 puffs into the lungs every 4 (four) hours as needed for wheezing.  . [DISCONTINUED] tiotropium (SPIRIVA) 18 MCG inhalation  capsule Place 18 mcg into inhaler and inhale daily. Reported on 02/15/2016  . nortriptyline (PAMELOR) 10 MG capsule Take 1 capsule (10 mg total) by mouth at bedtime. (Patient not taking: Reported on 08/01/2016)   No facility-administered encounter medications on file as of 08/01/2016.     Activities of Daily Living In your present state of health, do you have any difficulty performing the following activities: 08/01/2016  Hearing? N  Vision? N  Difficulty concentrating or making decisions? Y  Walking or climbing stairs? Y  Dressing or bathing? N  Doing errands, shopping? N  Preparing Food and eating ? N  Using the Toilet? N  In the past six months, have you accidently leaked urine? Y  Do you have problems with loss of bowel control? N  Managing your Medications? N  Managing your Finances? N  Housekeeping or managing your Housekeeping? N  Some recent data might be hidden    Patient Care Team: [name], FNP as PCP - General (Family Medicine)    Assessment:    This is a routine wellness examination for Brandy Armstrong. The goal of the wellness visit is to assist the patient how to close the gaps in care and create a preventative care plan for the patient.   Osteoporosis risk reviewed.  Medications reviewed; taking without issues or barriers.  Safety issues reviewed; smoke detectors in the home. No firearms in the home. Wears seatbelts when driving or riding with others. No violence in the home.  No identified risk were noted; The patient was oriented x 3; appropriate in dress and manner and no objective failures at ADL's or IADL's.   Body mass index; discussed the importance of a healthy diet, water intake and exercise. Educational material provided.  Atrial fibrillation-stable and followed by Dr. Fletcher Anon.  Patient Concerns: Bilateral lower extremity cramps x1 day.   Urinary frequency with difficulty emptying bladder x2 days.  Consistent trickle.  Wears incontinent brief.   Diarrhea x1 day.  Return in 5 days for scheduled follow up with FNP.  Encouraged to contact the office or ED if condition worsens.  Exercise Activities and Dietary recommendations Current Exercise Habits: Home exercise routine (Walks the dog daily for 10 minutes.), Type of exercise: walking, Time (Minutes): 10, Frequency (Times/Week): 7, Weekly Exercise (Minutes/Week): 70, Intensity: Mild  Goals    . Increase physical activity          Continue walking the dog and increase walking an additional 20 minutes, continue to increase as tolerated.     . Increase water intake          STAY HYDRATED! DRINK PLENTY OF WATER. INCREASE WATER INTAKE UP TO  4 CUPS DAILY.      Fall Risk Fall Risk  08/01/2016 07/08/2016 01/25/2015  Falls in the past year? Yes Yes No  Number falls in past yr: 1 1 -  Injury with Fall? - Yes -  Follow up Falls prevention discussed;Education provided - -   Depression Screen PHQ 2/9 Scores 08/01/2016 01/25/2015  PHQ - 2 Score 0 0     Cognitive Testing MMSE - Mini Mental State Exam 08/01/2016  Orientation to time 5  Orientation to Place 5  Registration 3  Attention/ Calculation 5  Recall 3  Language- name 2 objects 2  Language- repeat 1  Language- follow 3 step command 3  Language- read & follow direction 1  Write a sentence 1  Copy design 1  Total score 30    Immunization History  Administered Date(s) Administered  . Influenza Split 10/24/2013  . Influenza,inj,Quad PF,36+ Mos 10/20/2015  . Influenza-Unspecified 09/29/2014  . Pneumococcal Polysaccharide-23 04/15/2014  . Td 08/09/2014   Screening Tests Health Maintenance  Topic Date Due  . ZOSTAVAX  08/07/2008  . PNA vac Low Risk Adult (2 of 2 - PCV13) 04/16/2015  . INFLUENZA VACCINE  07/30/2016  . MAMMOGRAM  07/04/2018  . TETANUS/TDAP  08/09/2024  . COLONOSCOPY  05/07/2025  . DEXA SCAN  Completed  . Hepatitis C Screening  Completed      Plan:   End of life planning; Advance aging; Advanced  directives discussed. Copy of current HCPOA/Living Will requested.  During the course of the visit the patient was educated and counseled about the following appropriate screening and preventive services:   Vaccines to include Pneumoccal, Influenza, Hepatitis B, Td, Zostavax, HCV  Electrocardiogram  Cardiovascular Disease  Colorectal cancer screening  Bone density screening  Diabetes screening  Glaucoma screening  Mammography/PAP  Nutrition counseling   Patient Instructions (the written plan) was given to the patient.   Varney Biles, LPN  [id number]

## 2016-08-02 ENCOUNTER — Ambulatory Visit: Payer: Medicare Other | Admitting: Family Medicine

## 2016-08-02 NOTE — Progress Notes (Signed)
Reviewed and agreed with plan. Patient has follow up schedule with me for complaints that came up.

## 2016-08-05 ENCOUNTER — Other Ambulatory Visit: Payer: Self-pay | Admitting: Neurology

## 2016-08-05 ENCOUNTER — Telehealth: Payer: Self-pay | Admitting: Neurology

## 2016-08-05 NOTE — Telephone Encounter (Signed)
Dr Jaynee Eagles- please advise  Called pt back. She stated Dr Jaynee Eagles advised at last OV that she would call her after speaking to her cardiologist to see if they are ok with her taking the nortriptyline. Since OV, she has more problems with her legs. Both legs are hurting. She is not sure if this is the neuropathy. She feels like the sx have gotten worse within the past week or so. She feels like her walking is affected. When she walks her dogs, she is having to sit down. She does not think it is her COPD. She is wondering if she should she increase the Lyrica. She is taking 2 tablets per day. She is going to see PCP tomorrow to be evaluated. She feels like it might be peripheral artery disease.    Advised I will send message to Dr Jaynee Eagles and call back to advise. If PCP feels she needs to f/u with [place], we are happy to schedule an appt. She verbalized understanding.

## 2016-08-05 NOTE — Telephone Encounter (Signed)
Pt called said Dr Jaynee Eagles told her she would call her when she had nortriptyline (PAMELOR) 10 MG capsule approved by her cardiologist but she hasn't heard anything.  She also said bil legs are hurting in thighs and calves for about 1-1/2 wk. She is wanting to be tested for PAD. Please call

## 2016-08-05 NOTE — Telephone Encounter (Signed)
Terrence Dupont, let her know not to start the Nirtriptyline, it can interact with some of her other medication. I'd rather increase the Lyrica if she is ok with that. Verify she is taking 100mg  three times a day. We can go up to 150 mg three times a day.  Please let me know thanks

## 2016-08-05 NOTE — Telephone Encounter (Signed)
Tried calling pt back. Automated message stated pt was on other line. I LVM for pt to call back. Pt has f/u on 10/26 with AA, MD

## 2016-08-05 NOTE — Telephone Encounter (Signed)
Patient returned Emma's call. Please call [phone].

## 2016-08-06 ENCOUNTER — Telehealth: Payer: Self-pay | Admitting: Family

## 2016-08-06 ENCOUNTER — Encounter: Payer: Self-pay | Admitting: Family

## 2016-08-06 ENCOUNTER — Ambulatory Visit (INDEPENDENT_AMBULATORY_CARE_PROVIDER_SITE_OTHER): Payer: Medicare Other | Admitting: Family

## 2016-08-06 VITALS — BP 114/68 | HR 81 | Temp 97.9°F | Wt 207.2 lb

## 2016-08-06 DIAGNOSIS — R3915 Urgency of urination: Secondary | ICD-10-CM | POA: Insufficient documentation

## 2016-08-06 DIAGNOSIS — Z0000 Encounter for general adult medical examination without abnormal findings: Secondary | ICD-10-CM | POA: Diagnosis not present

## 2016-08-06 DIAGNOSIS — G629 Polyneuropathy, unspecified: Secondary | ICD-10-CM

## 2016-08-06 DIAGNOSIS — Z0001 Encounter for general adult medical examination with abnormal findings: Secondary | ICD-10-CM | POA: Diagnosis not present

## 2016-08-06 DIAGNOSIS — M79606 Pain in leg, unspecified: Secondary | ICD-10-CM

## 2016-08-06 DIAGNOSIS — N3001 Acute cystitis with hematuria: Secondary | ICD-10-CM

## 2016-08-06 LAB — URINALYSIS, ROUTINE W REFLEX MICROSCOPIC
Bilirubin Urine: NEGATIVE
Hgb urine dipstick: NEGATIVE
KETONES UR: NEGATIVE
NITRITE: POSITIVE — AB
SPECIFIC GRAVITY, URINE: 1.02 (ref 1.000–1.030)
Total Protein, Urine: NEGATIVE
URINE GLUCOSE: NEGATIVE
UROBILINOGEN UA: 0.2 (ref 0.0–1.0)
pH: 6 (ref 5.0–8.0)

## 2016-08-06 LAB — HEMOGLOBIN A1C: HEMOGLOBIN A1C: 5.5 % (ref 4.6–6.5)

## 2016-08-06 LAB — LIPID PANEL
CHOL/HDL RATIO: 4
CHOLESTEROL: 133 mg/dL (ref 0–200)
HDL: 33.2 mg/dL — ABNORMAL LOW (ref >39.00)
LDL Cholesterol: 76 mg/dL (ref 0–99)
NonHDL: 99.76
TRIGLYCERIDES: 121 mg/dL (ref 0.0–149.0)
VLDL: 24.2 mg/dL (ref 0.0–40.0)

## 2016-08-06 MED ORDER — SULFAMETHOXAZOLE-TRIMETHOPRIM 800-160 MG PO TABS: 1.0000 | ORAL_TABLET | Freq: Two times a day (BID) | ORAL | 0 refills | Status: DC

## 2016-08-06 NOTE — Assessment & Plan Note (Addendum)
Bilateral lower extremity pain with exercise. Considering PAD however reassured by palpable pedal pulses. No swelling or increased diameter calves to suggest DVT. Skin intact. Pending ABI study, lipid panel, and A1C. Alternately considering worsening neuropathy.

## 2016-08-06 NOTE — Assessment & Plan Note (Signed)
Symptoms appear to be resolving. Pending urinalysis.

## 2016-08-06 NOTE — Assessment & Plan Note (Addendum)
Offered shingles and Prevnar vaccine. Patient politely declined. Screening labs ordered that had not been done in past year. Up-to-date on mammogram and colonoscopy. No longer doing Pap smears due to hysterectomy.

## 2016-08-06 NOTE — Telephone Encounter (Signed)
Dr Jaynee Eagles- FYI  Called and spoke to patient. She stated she has been only taking 2 tablets (100 mg) per day, not the 3 tablets per day. She originally was taking 3 tablets but went back to 2 tablets a day because she states it was causing her to gain weight and she didn't like that. She is willing to try and go back to 3 tablets a day of the 100mg . She verified she has enough medication to go back to taking it 3x/day. She wants to try this first and if she does well, she is willing to increase to 150mg  3x/day. She will call back if she experiences any SE or has any questions.

## 2016-08-06 NOTE — Progress Notes (Signed)
Subjective:    Patient ID: Brandy Armstrong, female    DOB: [birth year], 70 y.o.   MRN: [id number]  CC: Brandy Armstrong is a 70 y.o. female who presents today for follow up.   HPI: Patient here to establish care and for routine physical. She also has multiple complaints.  Primary complaint today is leg pain for past couple of weeks. She also describes muscle pain in calves with walking.Has been trying to exercise and go on a diet however unable to due to pain.  No pain with rest. Former smoker, quit 2012. No LE swelling.   Also complains of urinary urgency for past couple of days. Has been drinking lots of water and symptom improved. However would like a UA today. No dysuria, frequency.   She has chronic LE neuropathy. Neurologist increased lyrica yesterday for neuropathy. We'll continue to follow with neurology.   Mammogram, colonoscopy UTD. Hysterectomy and does not do Pap smears anymore.   Unsure about vaccine h/o for Prenar-13. Believes she had it 2-3 years ago at [facility] however cannot obtain records. Politely declines booster Today.  Hasn't had shingles vaccine. However cannot have it due to RA.   Follows with rheumatologist, pulmonologist, neurologist, cardiologist.    HISTORY:  Past Medical History:  Diagnosis Date  . Atrial fibrillation [facility])    a. s/p ablation 01/2012 in [facility] by Dr. Boyd Kerbs;  b. On sotalol & Xarelto;  c. 02/2014 Echo: EF 50-55%, mild conc LVH, nl LA size/structure;  d. Recurrent afib 8/15 & 08/29/2014.  . Barretts esophagus   . Chest pain    a. 02/2014 Myoview: Ef 50%, no ischemia.  . Colon polyps   . COPD (chronic obstructive pulmonary disease) ([place])   . COPD (chronic obstructive pulmonary disease) ([place])   . Depression with anxiety   . GERD (gastroesophageal reflux disease)   . Neuropathy ([place])   . Rectal fistula   . Rheumatoid arthritis ([place])    On methotrexate and orencia  . Urinary incontinence   . Vitamin D deficiency    Past Surgical History:    Procedure Laterality Date  . ABDOMINAL HYSTERECTOMY  1992  . CARDIAC ELECTROPHYSIOLOGY STUDY AND ABLATION  2013  . CHOLECYSTECTOMY  1987  . COLONOSCOPY N/A 05/08/2015   Procedure: COLONOSCOPY;  Surgeon: Manya Silvas, MD;  Location: [facility] ENDOSCOPY;  Service: Endoscopy;  Laterality: N/A;  . ESOPHAGOGASTRODUODENOSCOPY N/A 05/06/2015   Procedure: ESOPHAGOGASTRODUODENOSCOPY (EGD);  Surgeon: Lollie Sails, MD;  Location: [facility] ENDOSCOPY;  Service: Endoscopy;  Laterality: N/A;  . oophrectomy Bilateral 1992   Family History  Problem Relation Age of Onset  . Depression Mother   . Cancer Mother 86    pancreatic cancer  . Cancer Father 85    colon cancer  . Cancer Sister 30    breast cancer  . Breast cancer Sister 3  . Diabetes Brother   . Neuropathy Neg Hx     Allergies: Latex Current Outpatient Prescriptions on File Prior to Visit  Medication Sig Dispense Refill  . albuterol (PROVENTIL HFA;VENTOLIN HFA) 108 (90 Base) MCG/ACT inhaler Inhale into the lungs.    [name] amiodarone (PACERONE) 200 MG tablet Take 200 mg by mouth daily.    [name] amoxicillin (AMOXIL) 500 MG tablet Take two tablets ( total 1000 mg) PO q24h for 10 days 20 tablet 0  . clonazePAM (KLONOPIN) 0.5 MG tablet TAKE 1 TABLET BY MOUTH 3 TIMES DAILY AS NEEDED FOR ANXIETY 90 tablet 1  . cyclobenzaprine (FLEXERIL) 10  MG tablet Take 1 tablet (10 mg total) by mouth 3 (three) times daily as needed for muscle spasms. 30 tablet 3  . ELIQUIS 5 MG TABS tablet TAKE 1 TABLET (5 MG TOTAL) BY MOUTH TWO (2) TIMES A DAY.  6  . escitalopram (LEXAPRO) 10 MG tablet Take 1 tablet (10 mg total) by mouth daily. 90 tablet 1  . esomeprazole (NEXIUM) 40 MG capsule TAKE ONE CAPSULE BY MOUTH DAILY AT 12 NOON 30 capsule 11  . hydroxychloroquine (PLAQUENIL) 200 MG tablet Take 200 mg by mouth.    . leflunomide (ARAVA) 20 MG tablet Take 20 mg by mouth daily.    [name] lidocaine (XYLOCAINE) 5 % ointment APPLY 1 APPLICATION TOPICALLY AS NEEDED. 35.44 g 0  .  metoprolol succinate (TOPROL-XL) 25 MG 24 hr tablet   6  . pregabalin (LYRICA) 100 MG capsule Take 1 capsule (100 mg total) by mouth 3 (three) times daily as needed. 90 capsule 3  . SPIRIVA RESPIMAT 2.5 MCG/ACT AERS INHALE 2 PUFFS BY MOUTH DAILY.  6  . SYMBICORT 160-4.5 MCG/ACT inhaler INHALE 2 PUFFS TWO (2) TIMES A DAY. 10.2 Inhaler 1  . topiramate (TOPAMAX) 25 MG tablet      No current facility-administered medications on file prior to visit.     Social History  Substance Use Topics  . Smoking status: Former Smoker    Packs/day: 1.00    Years: 40.00    Quit date: 12/16/2011  . Smokeless tobacco: Never Used  . Alcohol use 0.0 oz/week     Comment: Occasionally has a drink    Review of Systems  Constitutional: Negative for chills and fever.  Respiratory: Negative for cough.   Cardiovascular: Negative for chest pain, palpitations and leg swelling.  Gastrointestinal: Negative for nausea and vomiting.  Genitourinary: Positive for urgency (resolved). Negative for dysuria, flank pain and frequency.  Skin: Negative for rash and wound.  Neurological: Positive for numbness (chronic, LE).      Objective:    BP 114/68 (BP Location: Left Arm, Patient Position: Sitting, Cuff Size: Large)   Pulse 81   Temp 97.9 F (36.6 C) (Oral)   Wt 207 lb 3.2 oz (94 kg)   SpO2 97%   BMI 32.94 kg/m  BP Readings from Last 3 Encounters:  08/06/16 114/68  08/01/16 106/62  07/08/16 100/60   Wt Readings from Last 3 Encounters:  08/06/16 207 lb 3.2 oz (94 kg)  08/01/16 206 lb 6.4 oz (93.6 kg)  07/08/16 212 lb (96.2 kg)    Physical Exam  Constitutional: She appears well-developed and well-nourished.  Eyes: Conjunctivae are normal.  Cardiovascular: Normal rate, regular rhythm, normal heart sounds and normal pulses.   No LE swelling, erythema. Palpable pedal pulses. Skin warm to the touch. Increased varicosities noted over dorsal aspect of foot. No increase in calf circumference when compared  bilaterally.  Pulmonary/Chest: Effort normal and breath sounds normal. She has no wheezes. She has no rhonchi. She has no rales.  Neurological: She is alert.  Skin: Skin is warm and dry.  Psychiatric: She has a normal mood and affect. Her speech is normal and behavior is normal. Thought content normal.  Vitals reviewed.      Assessment & Plan:   Problem List Items Addressed This Visit      Nervous and Auditory   Neuropathy ([place])     Lyrica dose increased. Advised to continue to follow with neurology.      Relevant Orders   Hemoglobin A1c  Other   Encounter for routine adult health examination with abnormal findings - Primary    Offered shingles and Prevnar vaccine. Patient politely declined. Screening labs ordered that had not been done in past year. Up-to-date on mammogram and colonoscopy. No longer doing Pap smears due to hysterectomy.      Relevant Orders   Hemoglobin A1c   Lipid panel   Musculoskeletal leg pain    Bilateral lower extremity pain with exercise. Considering PAD however reassured by palpable pedal pulses. No swelling or increased diameter calves to suggest DVT. Skin intact. Pending ABI study, lipid panel, and A1C. Alternately considering worsening neuropathy.       Relevant Orders   Hemoglobin A1c   Lipid panel   Ambulatory referral to Vascular Surgery   Urinary urgency    Symptoms appear to be resolving. Pending urinalysis.      Relevant Orders   Hemoglobin A1c   Urinalysis, Routine w reflex microscopic (not at [facility])    Other Visit Diagnoses   None.      I am having Ms. Kroboth maintain her leflunomide, metoprolol succinate, clonazePAM, escitalopram, amiodarone, esomeprazole, albuterol, cyclobenzaprine, pregabalin, lidocaine, SPIRIVA RESPIMAT, topiramate, ELIQUIS, hydroxychloroquine, amoxicillin, and SYMBICORT.   No orders of the defined types were placed in this encounter.   Return precautions given.   Risks, benefits, and alternatives  of the medications and treatment plan prescribed today were discussed, and patient expressed understanding.   Education regarding symptom management and diagnosis given to patient on AVS.  Continue to follow with Mable Paris, FNP for routine health maintenance.   [name] and I agreed with plan.   Mable Paris, FNP

## 2016-08-06 NOTE — Assessment & Plan Note (Signed)
Lyrica dose increased. Advised to continue to follow with neurology.

## 2016-08-06 NOTE — Patient Instructions (Addendum)
Pleasure seeing you.  We will do a vascular study to help further understanding of leg pain.  If there is no improvement in your symptoms, or if there is any worsening of symptoms, or if you have any additional concerns, please return for re-evaluation; or, if we are closed, consider going to the Emergency Room for evaluation if symptoms urgent.

## 2016-08-07 ENCOUNTER — Encounter: Payer: Medicare Other | Admitting: Neurology

## 2016-08-09 DIAGNOSIS — M79609 Pain in unspecified limb: Secondary | ICD-10-CM | POA: Diagnosis not present

## 2016-08-12 NOTE — Telephone Encounter (Signed)
Patient was informed of results.  Patient understood and no questions, comments, or concerns at this time.  

## 2016-08-12 NOTE — Telephone Encounter (Signed)
Please call patient.  Ankle brachial index for peripheral arterial disease is normal which is great news.   I suspect this is the neuropathy causing the pain at this time. How is she doing on the higher dose of lyrica as prescribed by neurology?  Are her urine symptoms improved on antibiotic?  Please f/u if either symptoms worse or not resolving.

## 2016-08-13 ENCOUNTER — Ambulatory Visit (INDEPENDENT_AMBULATORY_CARE_PROVIDER_SITE_OTHER): Payer: Medicare Other | Admitting: Family

## 2016-08-13 ENCOUNTER — Observation Stay
Admission: EM | Admit: 2016-08-13 | Discharge: 2016-08-14 | Disposition: A | Discharge status: Home / Self Care | Payer: Medicare Other | Attending: Internal Medicine | Admitting: Internal Medicine

## 2016-08-13 ENCOUNTER — Encounter: Payer: Self-pay | Admitting: Emergency Medicine

## 2016-08-13 ENCOUNTER — Encounter: Payer: Self-pay | Admitting: Family

## 2016-08-13 ENCOUNTER — Ambulatory Visit (INDEPENDENT_AMBULATORY_CARE_PROVIDER_SITE_OTHER): Payer: Medicare Other

## 2016-08-13 VITALS — BP 124/66 | HR 85 | Temp 98.4°F | Ht 66.5 in | Wt 209.2 lb

## 2016-08-13 DIAGNOSIS — F418 Other specified anxiety disorders: Secondary | ICD-10-CM | POA: Diagnosis not present

## 2016-08-13 DIAGNOSIS — M419 Scoliosis, unspecified: Secondary | ICD-10-CM | POA: Insufficient documentation

## 2016-08-13 DIAGNOSIS — G629 Polyneuropathy, unspecified: Secondary | ICD-10-CM | POA: Diagnosis not present

## 2016-08-13 DIAGNOSIS — I10 Essential (primary) hypertension: Secondary | ICD-10-CM | POA: Diagnosis not present

## 2016-08-13 DIAGNOSIS — Z803 Family history of malignant neoplasm of breast: Secondary | ICD-10-CM | POA: Diagnosis not present

## 2016-08-13 DIAGNOSIS — M069 Rheumatoid arthritis, unspecified: Secondary | ICD-10-CM | POA: Diagnosis not present

## 2016-08-13 DIAGNOSIS — J449 Chronic obstructive pulmonary disease, unspecified: Secondary | ICD-10-CM | POA: Insufficient documentation

## 2016-08-13 DIAGNOSIS — Z90710 Acquired absence of both cervix and uterus: Secondary | ICD-10-CM | POA: Diagnosis not present

## 2016-08-13 DIAGNOSIS — Z9049 Acquired absence of other specified parts of digestive tract: Secondary | ICD-10-CM | POA: Diagnosis not present

## 2016-08-13 DIAGNOSIS — G8929 Other chronic pain: Secondary | ICD-10-CM

## 2016-08-13 DIAGNOSIS — I480 Paroxysmal atrial fibrillation: Secondary | ICD-10-CM | POA: Diagnosis not present

## 2016-08-13 DIAGNOSIS — Z9889 Other specified postprocedural states: Secondary | ICD-10-CM | POA: Insufficient documentation

## 2016-08-13 DIAGNOSIS — M545 Low back pain, unspecified: Secondary | ICD-10-CM

## 2016-08-13 DIAGNOSIS — D649 Anemia, unspecified: Secondary | ICD-10-CM | POA: Diagnosis not present

## 2016-08-13 DIAGNOSIS — I700 Atherosclerosis of aorta: Secondary | ICD-10-CM

## 2016-08-13 DIAGNOSIS — F329 Major depressive disorder, single episode, unspecified: Secondary | ICD-10-CM | POA: Insufficient documentation

## 2016-08-13 DIAGNOSIS — M160 Bilateral primary osteoarthritis of hip: Secondary | ICD-10-CM | POA: Diagnosis not present

## 2016-08-13 DIAGNOSIS — Z818 Family history of other mental and behavioral disorders: Secondary | ICD-10-CM | POA: Diagnosis not present

## 2016-08-13 DIAGNOSIS — Z86010 Personal history of colonic polyps: Secondary | ICD-10-CM | POA: Insufficient documentation

## 2016-08-13 DIAGNOSIS — Z87891 Personal history of nicotine dependence: Secondary | ICD-10-CM | POA: Diagnosis not present

## 2016-08-13 DIAGNOSIS — K219 Gastro-esophageal reflux disease without esophagitis: Secondary | ICD-10-CM | POA: Diagnosis not present

## 2016-08-13 DIAGNOSIS — R200 Anesthesia of skin: Secondary | ICD-10-CM

## 2016-08-13 DIAGNOSIS — I739 Peripheral vascular disease, unspecified: Secondary | ICD-10-CM | POA: Insufficient documentation

## 2016-08-13 DIAGNOSIS — R202 Paresthesia of skin: Secondary | ICD-10-CM | POA: Diagnosis not present

## 2016-08-13 DIAGNOSIS — Z800 Family history of malignant neoplasm of digestive organs: Secondary | ICD-10-CM | POA: Insufficient documentation

## 2016-08-13 DIAGNOSIS — I482 Chronic atrial fibrillation: Secondary | ICD-10-CM | POA: Insufficient documentation

## 2016-08-13 DIAGNOSIS — M549 Dorsalgia, unspecified: Secondary | ICD-10-CM | POA: Diagnosis not present

## 2016-08-13 DIAGNOSIS — K2270 Barrett's esophagus without dysplasia: Secondary | ICD-10-CM | POA: Insufficient documentation

## 2016-08-13 DIAGNOSIS — Z833 Family history of diabetes mellitus: Secondary | ICD-10-CM | POA: Insufficient documentation

## 2016-08-13 DIAGNOSIS — Z7901 Long term (current) use of anticoagulants: Secondary | ICD-10-CM | POA: Insufficient documentation

## 2016-08-13 DIAGNOSIS — Z90722 Acquired absence of ovaries, bilateral: Secondary | ICD-10-CM | POA: Diagnosis not present

## 2016-08-13 DIAGNOSIS — M47816 Spondylosis without myelopathy or radiculopathy, lumbar region: Secondary | ICD-10-CM | POA: Diagnosis not present

## 2016-08-13 LAB — BASIC METABOLIC PANEL
ANION GAP: 8 (ref 5–15)
BUN: 16 mg/dL (ref 6–20)
CO2: 18 mmol/L — ABNORMAL LOW (ref 22–32)
Calcium: 8.8 mg/dL — ABNORMAL LOW (ref 8.9–10.3)
Chloride: 110 mmol/L (ref 101–111)
Creatinine, Ser: 0.99 mg/dL (ref 0.44–1.00)
GFR calc Af Amer: >60 mL/min (ref >60)
GFR, EST NON AFRICAN AMERICAN: 57 mL/min — AB (ref >60)
GLUCOSE: 132 mg/dL — AB (ref 65–99)
POTASSIUM: 3.6 mmol/L (ref 3.5–5.1)
Sodium: 136 mmol/L (ref 135–145)

## 2016-08-13 LAB — CBC
HEMATOCRIT: 23.9 % — AB (ref 35.0–47.0)
HEMOGLOBIN: 7.2 g/dL — AB (ref 12.0–16.0)
MCH: 18.4 pg — ABNORMAL LOW (ref 26.0–34.0)
MCHC: 30.1 g/dL — AB (ref 32.0–36.0)
MCV: 61.1 fL — ABNORMAL LOW (ref 80.0–100.0)
Platelets: 206 10*3/uL (ref 150–440)
RBC: 3.91 MIL/uL (ref 3.80–5.20)
RDW: 21.1 % — ABNORMAL HIGH (ref 11.5–14.5)
WBC: 5.3 10*3/uL (ref 3.6–11.0)

## 2016-08-13 LAB — URINALYSIS COMPLETE WITH MICROSCOPIC (ARMC ONLY)
BACTERIA UA: NONE SEEN
Bilirubin Urine: NEGATIVE
Glucose, UA: NEGATIVE mg/dL
HGB URINE DIPSTICK: NEGATIVE
Ketones, ur: NEGATIVE mg/dL
LEUKOCYTES UA: NEGATIVE
NITRITE: NEGATIVE
PH: 6 (ref 5.0–8.0)
Protein, ur: NEGATIVE mg/dL
RBC / HPF: NONE SEEN RBC/hpf (ref 0–5)
Specific Gravity, Urine: 1.004 — ABNORMAL LOW (ref 1.005–1.030)

## 2016-08-13 LAB — CBC WITH DIFFERENTIAL/PLATELET
BASOS ABS: 0 10*3/uL (ref 0.0–0.1)
BASOS PCT: 0.3 % (ref 0.0–3.0)
Eosinophils Absolute: 0 10*3/uL (ref 0.0–0.7)
Eosinophils Relative: 0 % (ref 0.0–5.0)
HEMATOCRIT: 23.9 % — AB (ref 36.0–46.0)
Hemoglobin: 7.2 g/dL — CL (ref 12.0–15.0)
LYMPHS PCT: 12 % (ref 12.0–46.0)
Lymphs Abs: 0.7 10*3/uL (ref 0.7–4.0)
MCHC: 30.3 g/dL (ref 30.0–36.0)
MCV: 61 fl — AB (ref 78.0–100.0)
MONOS PCT: 2.9 % — AB (ref 3.0–12.0)
Monocytes Absolute: 0.2 10*3/uL (ref 0.1–1.0)
NEUTROS ABS: 4.8 10*3/uL (ref 1.4–7.7)
Neutrophils Relative %: 84.8 % — ABNORMAL HIGH (ref 43.0–77.0)
PLATELETS: 233 10*3/uL (ref 150.0–400.0)
RBC: 3.91 Mil/uL (ref 3.87–5.11)
RDW: 21 % — ABNORMAL HIGH (ref 11.5–15.5)
WBC: 5.6 10*3/uL (ref 4.0–10.5)

## 2016-08-13 LAB — IRON AND TIBC
IRON: 9 ug/dL — AB (ref 28–170)
Saturation Ratios: 2 % — ABNORMAL LOW (ref 10.4–31.8)
TIBC: 410 ug/dL (ref 250–450)
UIBC: 401 ug/dL

## 2016-08-13 LAB — FERRITIN: FERRITIN: 8 ng/mL — AB (ref 11–307)

## 2016-08-13 LAB — RETICULOCYTES
RBC.: 3.92 MIL/uL (ref 3.80–5.20)
Retic Count, Absolute: 78.4 10*3/uL (ref 19.0–183.0)
Retic Ct Pct: 2 % (ref 0.4–3.1)

## 2016-08-13 LAB — PREPARE RBC (CROSSMATCH)

## 2016-08-13 LAB — FOLATE: Folate: 10.9 ng/mL (ref >5.9)

## 2016-08-13 MED ORDER — TOPIRAMATE 25 MG PO TABS
50.0000 mg | ORAL_TABLET | Freq: Every day | ORAL | Status: DC
  Administered 2016-08-14: 50 mg via ORAL
  Filled 2016-08-13: qty 2

## 2016-08-13 MED ORDER — PREGABALIN 50 MG PO CAPS
100.0000 mg | ORAL_CAPSULE | Freq: Two times a day (BID) | ORAL | Status: DC
  Administered 2016-08-14 (×2): 100 mg via ORAL
  Filled 2016-08-13 (×2): qty 2

## 2016-08-13 MED ORDER — AMIODARONE HCL 200 MG PO TABS
200.0000 mg | ORAL_TABLET | Freq: Every day | ORAL | Status: DC
  Administered 2016-08-14: 200 mg via ORAL
  Filled 2016-08-13: qty 1

## 2016-08-13 MED ORDER — MOMETASONE FURO-FORMOTEROL FUM 200-5 MCG/ACT IN AERO
2.0000 | INHALATION_SPRAY | Freq: Two times a day (BID) | RESPIRATORY_TRACT | Status: DC
  Administered 2016-08-14: 2 via RESPIRATORY_TRACT
  Filled 2016-08-13: qty 8.8

## 2016-08-13 MED ORDER — PREDNISONE 10 MG PO TABS: ORAL_TABLET | ORAL | 0 refills | Status: DC

## 2016-08-13 MED ORDER — ACETAMINOPHEN 325 MG PO TABS: 650.0000 mg | ORAL_TABLET | Freq: Four times a day (QID) | ORAL | Status: DC | PRN

## 2016-08-13 MED ORDER — TIOTROPIUM BROMIDE MONOHYDRATE 18 MCG IN CAPS
1.0000 | ORAL_CAPSULE | Freq: Every day | RESPIRATORY_TRACT | Status: DC
  Administered 2016-08-14: 18 ug via RESPIRATORY_TRACT
  Filled 2016-08-13: qty 5

## 2016-08-13 MED ORDER — SODIUM CHLORIDE 0.9 % IV SOLN
Freq: Once | INTRAVENOUS | Status: AC
  Administered 2016-08-13: 22:00:00 via INTRAVENOUS

## 2016-08-13 MED ORDER — HYDROXYCHLOROQUINE SULFATE 200 MG PO TABS
200.0000 mg | ORAL_TABLET | Freq: Every day | ORAL | Status: DC
  Administered 2016-08-14: 200 mg via ORAL
  Filled 2016-08-13: qty 1

## 2016-08-13 MED ORDER — LEFLUNOMIDE 20 MG PO TABS
20.0000 mg | ORAL_TABLET | Freq: Every day | ORAL | Status: DC
  Administered 2016-08-14: 20 mg via ORAL
  Filled 2016-08-13: qty 1

## 2016-08-13 MED ORDER — ESCITALOPRAM OXALATE 10 MG PO TABS
10.0000 mg | ORAL_TABLET | Freq: Every day | ORAL | Status: DC
  Administered 2016-08-14: 10 mg via ORAL
  Filled 2016-08-13: qty 1

## 2016-08-13 MED ORDER — ACETAMINOPHEN 650 MG RE SUPP: 650.0000 mg | Freq: Four times a day (QID) | RECTAL | Status: DC | PRN

## 2016-08-13 MED ORDER — CYCLOBENZAPRINE HCL 10 MG PO TABS: 10.0000 mg | ORAL_TABLET | Freq: Every day | ORAL | Status: DC

## 2016-08-13 MED ORDER — METOPROLOL SUCCINATE ER 25 MG PO TB24
25.0000 mg | ORAL_TABLET | Freq: Two times a day (BID) | ORAL | Status: DC
  Administered 2016-08-14 (×2): 25 mg via ORAL
  Filled 2016-08-13 (×2): qty 1

## 2016-08-13 MED ORDER — SODIUM CHLORIDE 0.9 % IV SOLN
INTRAVENOUS | Status: DC
  Administered 2016-08-14: 04:00:00 via INTRAVENOUS

## 2016-08-13 MED ORDER — CLONAZEPAM 0.5 MG PO TABS: 0.5000 mg | ORAL_TABLET | Freq: Three times a day (TID) | ORAL | Status: DC | PRN

## 2016-08-13 MED ORDER — CYCLOBENZAPRINE HCL 10 MG PO TABS: 10.0000 mg | ORAL_TABLET | Freq: Every day | ORAL | 1 refills | Status: DC

## 2016-08-13 MED ORDER — PANTOPRAZOLE SODIUM 40 MG PO TBEC
40.0000 mg | DELAYED_RELEASE_TABLET | Freq: Every day | ORAL | Status: DC
  Administered 2016-08-14: 40 mg via ORAL
  Filled 2016-08-13: qty 1

## 2016-08-13 MED ORDER — ONDANSETRON HCL 4 MG PO TABS: 4.0000 mg | ORAL_TABLET | Freq: Four times a day (QID) | ORAL | Status: DC | PRN

## 2016-08-13 MED ORDER — SODIUM CHLORIDE 0.9 % IV SOLN: 10.0000 mL/h | Freq: Once | INTRAVENOUS | Status: DC

## 2016-08-13 MED ORDER — ONDANSETRON HCL 4 MG/2ML IJ SOLN: 4.0000 mg | Freq: Four times a day (QID) | INTRAMUSCULAR | Status: DC | PRN

## 2016-08-13 NOTE — ED Provider Notes (Signed)
Time Seen: Approximately1948  I have reviewed the triage notes  Chief Complaint: Abnormal Lab; Anemia; and Dizziness   History of Present Illness: Brandy Armstrong is a 70 y.o. female who presents with symptomatic anemia. Patient states that she went to her primary doctor mainly for concerns of left hip pain and back pain. Patient had routine lab work and does seem that her feelings of being tired and lightheaded and pale in appearance may be related to her COPD. The patient otherwise denies any nausea, vomiting. She is not aware of any black tarry stool. She is still on L Oquist for intermittent atrial fibrillation.   Past Medical History:  Diagnosis Date  . Atrial fibrillation [facility])    a. s/p ablation 01/2012 in [facility] by Dr. Boyd Kerbs;  b. On sotalol & Xarelto;  c. 02/2014 Echo: EF 50-55%, mild conc LVH, nl LA size/structure;  d. Recurrent afib 8/15 & 08/29/2014.  . Barretts esophagus   . Chest pain    a. 02/2014 Myoview: Ef 50%, no ischemia.  . Colon polyps   . COPD (chronic obstructive pulmonary disease) ([place])   . COPD (chronic obstructive pulmonary disease) ([place])   . Depression with anxiety   . GERD (gastroesophageal reflux disease)   . Neuropathy ([place])   . Rectal fistula   . Rheumatoid arthritis ([place])    On methotrexate and orencia  . Urinary incontinence   . Vitamin D deficiency     Patient Active Problem List   Diagnosis Date Noted  . Atherosclerosis of aorta ([place]) 08/13/2016  . Encounter for routine adult health examination with abnormal findings 08/06/2016  . Urinary urgency 08/06/2016  . Chronic back pain 05/20/2016  . Neuropathy ([place]) 10/05/2015  . Tremors of nervous system 06/18/2015  . GI bleed due to NSAIDs 05/04/2015  . Medicare annual wellness visit, subsequent 01/26/2015  . Atrial fibrillation ([place]) 03/11/2014  . COPD (chronic obstructive pulmonary disease) ([place]) 03/11/2014  . Fatigue 08/18/2013  . Barrett's esophagus 08/18/2013  . Chronic diarrhea  08/18/2013  . Numbness and tingling 08/18/2013    Past Surgical History:  Procedure Laterality Date  . ABDOMINAL HYSTERECTOMY  1992  . CARDIAC ELECTROPHYSIOLOGY STUDY AND ABLATION  2013  . CHOLECYSTECTOMY  1987  . COLONOSCOPY N/A 05/08/2015   Procedure: COLONOSCOPY;  Surgeon: Manya Silvas, MD;  Location: [facility] ENDOSCOPY;  Service: Endoscopy;  Laterality: N/A;  . ESOPHAGOGASTRODUODENOSCOPY N/A 05/06/2015   Procedure: ESOPHAGOGASTRODUODENOSCOPY (EGD);  Surgeon: Lollie Sails, MD;  Location: [facility] ENDOSCOPY;  Service: Endoscopy;  Laterality: N/A;  . oophrectomy Bilateral 1992    Past Surgical History:  Procedure Laterality Date  . ABDOMINAL HYSTERECTOMY  1992  . CARDIAC ELECTROPHYSIOLOGY STUDY AND ABLATION  2013  . CHOLECYSTECTOMY  1987  . COLONOSCOPY N/A 05/08/2015   Procedure: COLONOSCOPY;  Surgeon: Manya Silvas, MD;  Location: [facility] ENDOSCOPY;  Service: Endoscopy;  Laterality: N/A;  . ESOPHAGOGASTRODUODENOSCOPY N/A 05/06/2015   Procedure: ESOPHAGOGASTRODUODENOSCOPY (EGD);  Surgeon: Lollie Sails, MD;  Location: [facility] ENDOSCOPY;  Service: Endoscopy;  Laterality: N/A;  . oophrectomy Bilateral 1992    Current Outpatient Rx  . Order #: [id number] Class: Historical Med  . Order #: [id number] Class: Historical Med  . Order #: [id number] Class: Print  . Order #: [id number] Class: Normal  . Order #: [id number] Class: Historical Med  . Order #: [id number] Class: Normal  . Order #: [id number] Class: Normal  . Order #: [id number] Class: Historical Med  . Order #: [id number] Class: Historical Med  . Order #: [id number] Class: Normal  . Order #:  [id number] Class: Historical Med  . Order #: [id number] Class: Normal  . Order #: [id number] Class: Print  . Order #: [id number] Class: Historical Med  . Order #: [id number] Class: Normal  . Order #: [id number] Class: Normal  . Order #: [id number] Class: Historical Med    Allergies:  Latex  Family History: Family History  Problem Relation Age of Onset  .  Depression Mother   . Cancer Mother 76    pancreatic cancer  . Cancer Father 72    colon cancer  . Cancer Sister 30    breast cancer  . Breast cancer Sister 11  . Diabetes Brother   . Neuropathy Neg Hx     Social History: Social History  Substance Use Topics  . Smoking status: Former Smoker    Packs/day: 1.00    Years: 40.00    Quit date: 12/16/2011  . Smokeless tobacco: Never Used  . Alcohol use 0.0 oz/week     Comment: Occasionally has a drink     Review of Systems:   10 point review of systems was performed and was otherwise negative:  Constitutional: No fever Eyes: No visual disturbances ENT: No sore throat, ear pain Cardiac: No chest pain Respiratory: No shortness of breath, wheezing, or stridor Abdomen: No abdominal pain, no vomiting, No diarrhea Endocrine: No weight loss, No night sweats Extremities: No peripheral edema, cyanosis Skin: No rashes, easy bruising Neurologic: No focal weakness, trouble with speech or swollowing Urologic: No dysuria, Hematuria, or urinary frequency Patient denies any vaginal bleeding. She denies any hematuria. She denies any easy bruising that's new.  Physical Exam:  ED Triage Vitals  Enc Vitals Group     BP 08/13/16 1809 (!) 144/70     Pulse Rate 08/13/16 1809 79     Resp 08/13/16 1809 18     Temp 08/13/16 1809 98.2 F (36.8 C)     Temp Source 08/13/16 1809 Oral     SpO2 08/13/16 1809 98 %     Weight 08/13/16 1809 208 lb (94.3 kg)     Height 08/13/16 1809 5' 6.5" (1.689 m)     Head Circumference --      Peak Flow --      Pain Score 08/13/16 1810 0     Pain Loc --      Pain Edu? --      Excl. in [place]? --     General: Awake , Alert , and Oriented times 3; GCS 15 Head: Normal cephalic , atraumatic Eyes: Pupils equal , round, reactive to light Nose/Throat: No nasal drainage, patent upper airway without erythema or exudate.  Neck: Supple, Full range of motion, No anterior adenopathy or palpable thyroid masses Lungs:  Clear to ascultation without wheezes , rhonchi, or rales Heart: Regular rate, regular rhythm without murmurs , gallops , or rubs Abdomen: Soft, non tender without rebound, guarding , or rigidity; bowel sounds positive and symmetric in all 4 quadrants. No organomegaly .        Extremities: 2 plus symmetric pulses. No edema, clubbing or cyanosis Neurologic: normal ambulation, Motor symmetric without deficits, sensory intact Skin: warm, dry, no rashes Rectal exam is guaiac negative with normal sphincter tone  Labs:   All laboratory work was reviewed including any pertinent negatives or positives listed below:  Labs Reviewed  BASIC METABOLIC PANEL - Abnormal; Notable for the following:       Result Value   CO2 18 (*)    Glucose, Bld 132 (*)    Calcium 8.8 (*)  GFR calc non Af Amer 57 (*)    All other components within normal limits  CBC - Abnormal; Notable for the following:    Hemoglobin 7.2 (*)    HCT 23.9 (*)    MCV 61.1 (*)    MCH 18.4 (*)    MCHC 30.1 (*)    RDW 21.1 (*)    All other components within normal limits  URINALYSIS COMPLETEWITH MICROSCOPIC ([facility] ONLY) - Abnormal; Notable for the following:    Color, Urine STRAW (*)    APPearance CLEAR (*)    Specific Gravity, Urine 1.004 (*)    Squamous Epithelial / LPF 0-5 (*)    All other components within normal limits  TYPE AND SCREEN  PREPARE RBC (CROSSMATCH)    EKG: * ED ECG REPORT I, Daymon Larsen, the attending physician, personally viewed and interpreted this ECG.  Date: 08/13/2016 EKG Time: 1815 Rate: 76 Rhythm: normal sinus rhythm QRS Axis: normal Intervals: normal ST/T Wave abnormalities: Nonspecific ST and T wave abnormality Conduction Disturbances: none Narrative Interpretation: unremarkable    Critical Care:  CRITICAL CARE Performed by: Daymon Larsen   Total critical care time: 33 minutes  Critical care time was exclusive of separately billable procedures and treating other  patients.  Critical care was necessary to treat or prevent imminent or life-threatening deterioration.  Critical care was time spent personally by me on the following activities: development of treatment plan with patient and/or surrogate as well as nursing, discussions with consultants, evaluation of patient's response to treatment, examination of patient, obtaining history from patient or surrogate, ordering and performing treatments and interventions, ordering and review of laboratory studies, ordering and review of radiographic studies, pulse oximetry and re-evaluation of patient's condition. Initiation of blood transfusion for symptomatic anemia   ED Course:  Patient was typed and screened and is awaiting match for initiation of blood transfusion. Patient's had some generalized fatigue without any syncope or chest pain at this time. She is guaiac negative and she has had anemia before but apparently was from a GI source. The patient denies any fever or abdominal pain.  Clinical Course     Assessment:  Symptomatic anemia, unknown source  Final Clinical Impression:   Final diagnoses:  Symptomatic anemia     Plan: * Inpatient management            Daymon Larsen, MD 08/13/16 2011

## 2016-08-13 NOTE — Assessment & Plan Note (Signed)
Stable. On Lyrica. Follows with neurology.

## 2016-08-13 NOTE — ED Notes (Signed)
Hemocult negative. Pt denies any black stool. Pt denies taking iron supplements

## 2016-08-13 NOTE — Telephone Encounter (Signed)
Left multiple messages after receiving critical hemoglobin level for patient of 7.   Patient needs to go immediately to emergency room she will likely need higher level of care and possibly of blood transfusion.  If she feels symptomatic, please advise her to call 911.

## 2016-08-13 NOTE — Progress Notes (Addendum)
Subjective:    Patient ID: Brandy Armstrong, female    DOB: [birth year], 70 y.o.   MRN: [id number]  CC: Brandy Armstrong is a 70 y.o. female who presents today for follow up.   HPI: Patient here for follow-up for hip and low back pain. ABI was normal and did not show peripheral arterial. Lyrica is helping knees however bilateral hips continue to hurt. She also describes low back pain. 29 old prednisone yesterday which helped left hip pain. Pain is worse with walking and bending forward such as washing dishes.   No recent imaging of low back. No history of cancer. No LE weakness, falls, saddle anesthesia, or urinary or bowel incontinence.   Follows with rheumatology for RA. Usually has RA flare in hands.  See neurology for tremors in hand.   Follows with cardiology. S/p ablation for a. Fib. No chest pain or palpitations.    Due for CPE.   HISTORY:  Past Medical History:  Diagnosis Date  . Atrial fibrillation [facility])    a. s/p ablation 01/2012 in [facility] by Dr. Boyd Kerbs;  b. On sotalol & Xarelto;  c. 02/2014 Echo: EF 50-55%, mild conc LVH, nl LA size/structure;  d. Recurrent afib 8/15 & 08/29/2014.  . Barretts esophagus   . Chest pain    a. 02/2014 Myoview: Ef 50%, no ischemia.  . Colon polyps   . COPD (chronic obstructive pulmonary disease) ([place])   . COPD (chronic obstructive pulmonary disease) ([place])   . Depression with anxiety   . GERD (gastroesophageal reflux disease)   . Neuropathy ([place])   . Rectal fistula   . Rheumatoid arthritis ([place])    On methotrexate and orencia  . Urinary incontinence   . Vitamin D deficiency    Past Surgical History:  Procedure Laterality Date  . ABDOMINAL HYSTERECTOMY  1992  . CARDIAC ELECTROPHYSIOLOGY STUDY AND ABLATION  2013  . CHOLECYSTECTOMY  1987  . COLONOSCOPY N/A 05/08/2015   Procedure: COLONOSCOPY;  Surgeon: Manya Silvas, MD;  Location: [facility] ENDOSCOPY;  Service: Endoscopy;  Laterality: N/A;  . ESOPHAGOGASTRODUODENOSCOPY N/A 05/06/2015   Procedure: ESOPHAGOGASTRODUODENOSCOPY (EGD);  Surgeon: Lollie Sails, MD;  Location: [facility] ENDOSCOPY;  Service: Endoscopy;  Laterality: N/A;  . oophrectomy Bilateral 1992   Family History  Problem Relation Age of Onset  . Depression Mother   . Cancer Mother 43    pancreatic cancer  . Cancer Father 72    colon cancer  . Cancer Sister 30    breast cancer  . Breast cancer Sister 39  . Diabetes Brother   . Neuropathy Neg Hx     Allergies: Latex Current Outpatient Prescriptions on File Prior to Visit  Medication Sig Dispense Refill  . albuterol (PROVENTIL HFA;VENTOLIN HFA) 108 (90 Base) MCG/ACT inhaler Inhale into the lungs.    [name] amiodarone (PACERONE) 200 MG tablet Take 200 mg by mouth daily.    . clonazePAM (KLONOPIN) 0.5 MG tablet TAKE 1 TABLET BY MOUTH 3 TIMES DAILY AS NEEDED FOR ANXIETY 90 tablet 1  . ELIQUIS 5 MG TABS tablet TAKE 1 TABLET (5 MG TOTAL) BY MOUTH TWO (2) TIMES A DAY.  6  . escitalopram (LEXAPRO) 10 MG tablet Take 1 tablet (10 mg total) by mouth daily. 90 tablet 1  . esomeprazole (NEXIUM) 40 MG capsule TAKE ONE CAPSULE BY MOUTH DAILY AT 12 NOON 30 capsule 11  . hydroxychloroquine (PLAQUENIL) 200 MG tablet Take 200 mg by mouth.    . leflunomide (ARAVA) 20  MG tablet Take 20 mg by mouth daily.    [name] lidocaine (XYLOCAINE) 5 % ointment APPLY 1 APPLICATION TOPICALLY AS NEEDED. 35.44 g 0  . metoprolol succinate (TOPROL-XL) 25 MG 24 hr tablet   6  . pregabalin (LYRICA) 100 MG capsule Take 1 capsule (100 mg total) by mouth 3 (three) times daily as needed. 90 capsule 3  . SPIRIVA RESPIMAT 2.5 MCG/ACT AERS INHALE 2 PUFFS BY MOUTH DAILY.  6  . sulfamethoxazole-trimethoprim (BACTRIM DS,SEPTRA DS) 800-160 MG tablet Take 1 tablet by mouth 2 (two) times daily. 6 tablet 0  . SYMBICORT 160-4.5 MCG/ACT inhaler INHALE 2 PUFFS TWO (2) TIMES A DAY. 10.2 Inhaler 1  . topiramate (TOPAMAX) 25 MG tablet      No current facility-administered medications on file prior to visit.     Social  History  Substance Use Topics  . Smoking status: Former Smoker    Packs/day: 1.00    Years: 40.00    Quit date: 12/16/2011  . Smokeless tobacco: Never Used  . Alcohol use 0.0 oz/week     Comment: Occasionally has a drink    Review of Systems  Constitutional: Positive for fatigue. Negative for chills and fever.  Respiratory: Negative for cough and shortness of breath.   Cardiovascular: Negative for chest pain and palpitations.  Gastrointestinal: Negative for nausea and vomiting.  Musculoskeletal: Positive for back pain. Negative for joint swelling.      Objective:    There were no vitals taken for this visit. BP Readings from Last 3 Encounters:  08/13/16 (!) 144/70  08/06/16 114/68  08/01/16 106/62   Wt Readings from Last 3 Encounters:  08/13/16 208 lb (94.3 kg)  08/06/16 207 lb 3.2 oz (94 kg)  08/01/16 206 lb 6.4 oz (93.6 kg)    Physical Exam  Constitutional: She appears well-developed and well-nourished.  Eyes: Conjunctivae are normal.  Cardiovascular: Normal rate, regular rhythm, normal heart sounds and normal pulses.   Pulmonary/Chest: Effort normal and breath sounds normal. She has no wheezes. She has no rhonchi. She has no rales.  Musculoskeletal:       Lumbar back: She exhibits normal range of motion, no tenderness, no bony tenderness, no swelling, no edema, no pain and no spasm.  Full range of motion with flexion, tension, lateral side bends. Bony tenderness over lumbar spine. No pain, numbness, tingling elicited with single leg raise bilaterally.   Bilateral Hips: Slight limp. Full ROM with flexion and hip rotation in flexion.  No pain with lateral hip with  (flexion-abduction-external rotation) test. Slight pain with deep palpation of greater trochanter. No pain over SI joint.   Neurological: She is alert. She has normal strength. No sensory deficit.  Reflex Scores:      Patellar reflexes are 2+ on the right side and 2+ on the left side. Sensation and strength  intact bilateral lower extremities.  Skin: Skin is warm and dry.  Psychiatric: She has a normal mood and affect. Her speech is normal and behavior is normal. Thought content normal.  Vitals reviewed.      Assessment & Plan:   Problem List Items Addressed This Visit      Cardiovascular and Mediastinum   Atrial fibrillation ([place])    EKG: NSR. No acute ischemia. Rate 72 bpm. No acute ischemia. When compared to prior EKG 12/2014, no significant changes. On Eliquis. Follows with cardiology. Status post ablation.      Relevant Orders   EKG 12-Lead (Completed)     Other  Numbness and tingling    Stable. On Lyrica. Follows with neurology.      Chronic back pain    Pending x-ray of lumbar spine and bilateral hips and pelvis. States prednisone worked well for her , left hip pain particularly; I will try short course of prednisone. Also gia patient short supply of Flexeril to use as needed for low back pain as this has helped in the past. Discussed physical therapy as an adjunct to prednisone does not resolve.      Relevant Medications   predniSONE (DELTASONE) 10 MG tablet   cyclobenzaprine (FLEXERIL) 10 MG tablet    Other Visit Diagnoses    Bilateral low back pain without sciatica    -  Primary   Relevant Medications   predniSONE (DELTASONE) 10 MG tablet   cyclobenzaprine (FLEXERIL) 10 MG tablet   Other Relevant Orders   CBC with Differential/Platelet (Completed)   DG Lumbar Spine Complete (Completed)   DG HIPS BILAT WITH PELVIS 3-4 VIEWS (Completed)       I have changed Ms. Berwick's cyclobenzaprine. I am also having her start on predniSONE. Additionally, I am having her maintain her leflunomide, metoprolol succinate, clonazePAM, escitalopram, amiodarone, esomeprazole, albuterol, pregabalin, lidocaine, SPIRIVA RESPIMAT, topiramate, ELIQUIS, hydroxychloroquine, SYMBICORT, and sulfamethoxazole-trimethoprim.   Meds ordered this encounter  Medications  . predniSONE (DELTASONE) 10  MG tablet    Sig: Take 40 mg by mouth on day 1, then taper 10 mg daily until gone    Dispense:  10 tablet    Refill:  0    Order Specific Question:   Supervising Provider    Answer:   Deborra Medina L [[id number]]  . cyclobenzaprine (FLEXERIL) 10 MG tablet    Sig: Take 1 tablet (10 mg total) by mouth at bedtime.    Dispense:  30 tablet    Refill:  1    Order Specific Question:   Supervising Provider    Answer:   Crecencio Mc [[id number]]    Return precautions given.   Risks, benefits, and alternatives of the medications and treatment plan prescribed today were discussed, and patient expressed understanding.   Education regarding symptom management and diagnosis given to patient on AVS.  Continue to follow with Mable Paris, FNP for routine health maintenance.   [name] and I agreed with plan.   Mable Paris, FNP

## 2016-08-13 NOTE — Telephone Encounter (Signed)
The patient called back she will be going to [facility] ER.

## 2016-08-13 NOTE — ED Notes (Signed)
Patient presents to the ER stated that her pcp called her and told her she needed to come to the ER because her hemoglobin was 7.3.  Patient stated she is having Chronic left hip pain and back pain which is why she went to her PCP.  Her BM had noticed that she has been more pale, tired, and dizzier than her normal. Pt denies SOB and dizziness right now. Pt noted to be more pale.

## 2016-08-13 NOTE — Assessment & Plan Note (Addendum)
EKG: NSR. No acute ischemia. Rate 72 bpm. No acute ischemia. When compared to prior EKG 12/2014, no significant changes. On Eliquis. Follows with cardiology. Status post ablation.

## 2016-08-13 NOTE — ED Notes (Signed)
Pt up to the bathroom 1 assist

## 2016-08-13 NOTE — Telephone Encounter (Signed)
Spoke with annette lachman briefly and then patient beeped in.  Ms Banicki understands and will go to [facility] ER. Anne Ng will drive patient to ER.

## 2016-08-13 NOTE — Patient Instructions (Signed)
EKG shows normal sinus rhythm.  Will call with all results.   If there is no improvement in your symptoms, or if there is any worsening of symptoms, or if you have any additional concerns, please return for re-evaluation; or, if we are closed, consider going to the Emergency Room for evaluation if symptoms urgent.   Low Back Sprain With Rehab A sprain is an injury in which a ligament is torn. The ligaments of the lower back are vulnerable to sprains. However, they are strong and require great force to be injured. These ligaments are important for stabilizing the spinal column. Sprains are classified into three categories. Grade 1 sprains cause pain, but the tendon is not lengthened. Grade 2 sprains include a lengthened ligament, due to the ligament being stretched or partially ruptured. With grade 2 sprains there is still function, although the function may be decreased. Grade 3 sprains involve a complete tear of the tendon or muscle, and function is usually impaired. SYMPTOMS   Severe pain in the lower back.  Sometimes, a feeling of a "pop," "snap," or tear, at the time of injury.  Tenderness and sometimes swelling at the injury site.  Uncommonly, bruising (contusion) within 48 hours of injury.  Muscle spasms in the back. CAUSES  Low back sprains occur when a force is placed on the ligaments that is greater than they can handle. Common causes of injury include:  Performing a stressful act while off-balance.  Repetitive stressful activities that involve movement of the lower back.  Direct hit (trauma) to the lower back. RISK INCREASES WITH:  Contact sports (football, wrestling).  Collisions (major skiing accidents).  Sports that require throwing or lifting (baseball, weightlifting).  Sports involving twisting of the spine (gymnastics, diving, tennis, golf).  Poor strength and flexibility.  Inadequate protection.  Previous back injury or surgery (especially  fusion). PREVENTION  Wear properly fitted and padded protective equipment.  Warm up and stretch properly before activity.  Allow for adequate recovery between workouts.  Maintain physical fitness:  Strength, flexibility, and endurance.  Cardiovascular fitness.  Maintain a healthy body weight. PROGNOSIS  If treated properly, low back sprains usually heal with non-surgical treatment. The length of time for healing depends on the severity of the injury.  RELATED COMPLICATIONS   Recurring symptoms, resulting in a chronic problem.  Chronic inflammation and pain in the low back.  Delayed healing or resolution of symptoms, especially if activity is resumed too soon.  Prolonged impairment.  Unstable or arthritic joints of the low back. TREATMENT  Treatment first involves the use of ice and medicine, to reduce pain and inflammation. The use of strengthening and stretching exercises may help reduce pain with activity. These exercises may be performed at home or with a therapist. Severe injuries may require referral to a therapist for further evaluation and treatment, such as ultrasound. Your caregiver may advise that you wear a back brace or corset, to help reduce pain and discomfort. Often, prolonged bed rest results in greater harm then benefit. Corticosteroid injections may be recommended. However, these should be reserved for the most serious cases. It is important to avoid using your back when lifting objects. At night, sleep on your back on a firm mattress, with a pillow placed under your knees. If non-surgical treatment is unsuccessful, surgery may be needed.  MEDICATION   If pain medicine is needed, nonsteroidal anti-inflammatory medicines (aspirin and ibuprofen), or other minor pain relievers (acetaminophen), are often advised.  Do not take pain medicine for  7 days before surgery.  Prescription pain relievers may be given, if your caregiver thinks they are needed. Use only as  directed and only as much as you need.  Ointments applied to the skin may be helpful.  Corticosteroid injections may be given by your caregiver. These injections should be reserved for the most serious cases, because they may only be given a certain number of times. HEAT AND COLD  Cold treatment (icing) should be applied for 10 to 15 minutes every 2 to 3 hours for inflammation and pain, and immediately after activity that aggravates your symptoms. Use ice packs or an ice massage.  Heat treatment may be used before performing stretching and strengthening activities prescribed by your caregiver, physical therapist, or athletic trainer. Use a heat pack or a warm water soak. SEEK MEDICAL CARE IF:   Symptoms get worse or do not improve in 2 to 4 weeks, despite treatment.  You develop numbness or weakness in either leg.  You lose bowel or bladder function.  Any of the following occur after surgery: fever, increased pain, swelling, redness, drainage of fluids, or bleeding in the affected area.  New, unexplained symptoms develop. (Drugs used in treatment may produce side effects.) EXERCISES  RANGE OF MOTION (ROM) AND STRETCHING EXERCISES - Low Back Sprain Most people with lower back pain will find that their symptoms get worse with excessive bending forward (flexion) or arching at the lower back (extension). The exercises that will help resolve your symptoms will focus on the opposite motion.  Your physician, physical therapist or athletic trainer will help you determine which exercises will be most helpful to resolve your lower back pain. Do not complete any exercises without first consulting with your caregiver. Discontinue any exercises which make your symptoms worse, until you speak to your caregiver. If you have pain, numbness or tingling which travels down into your buttocks, leg or foot, the goal of the therapy is for these symptoms to move closer to your back and eventually resolve.  Sometimes, these leg symptoms will get better, but your lower back pain may worsen. This is often an indication of progress in your rehabilitation. Be very alert to any changes in your symptoms and the activities in which you participated in the 24 hours prior to the change. Sharing this information with your caregiver will allow him or her to most efficiently treat your condition. These exercises may help you when beginning to rehabilitate your injury. Your symptoms may resolve with or without further involvement from your physician, physical therapist or athletic trainer. While completing these exercises, remember:   Restoring tissue flexibility helps normal motion to return to the joints. This allows healthier, less painful movement and activity.  An effective stretch should be held for at least 30 seconds.  A stretch should never be painful. You should only feel a gentle lengthening or release in the stretched tissue. FLEXION RANGE OF MOTION AND STRETCHING EXERCISES: STRETCH - Flexion, Single Knee to Chest   Lie on a firm bed or floor with both legs extended in front of you.  Keeping one leg in contact with the floor, bring your opposite knee to your chest. Hold your leg in place by either grabbing behind your thigh or at your knee.  Pull until you feel a gentle stretch in your low back. Hold __________ seconds.  Slowly release your grasp and repeat the exercise with the opposite side. Repeat __________ times. Complete this exercise __________ times per day.  STRETCH - Flexion,  Double Knee to Chest  Lie on a firm bed or floor with both legs extended in front of you.  Keeping one leg in contact with the floor, bring your opposite knee to your chest.  Tense your stomach muscles to support your back and then lift your other knee to your chest. Hold your legs in place by either grabbing behind your thighs or at your knees.  Pull both knees toward your chest until you feel a gentle stretch  in your low back. Hold __________ seconds.  Tense your stomach muscles and slowly return one leg at a time to the floor. Repeat __________ times. Complete this exercise __________ times per day.  STRETCH - Low Trunk Rotation  Lie on a firm bed or floor. Keeping your legs in front of you, bend your knees so they are both pointed toward the ceiling and your feet are flat on the floor.  Extend your arms out to the side. This will stabilize your upper body by keeping your shoulders in contact with the floor.  Gently and slowly drop both knees together to one side until you feel a gentle stretch in your low back. Hold for __________ seconds.  Tense your stomach muscles to support your lower back as you bring your knees back to the starting position. Repeat the exercise to the other side. Repeat __________ times. Complete this exercise __________ times per day  EXTENSION RANGE OF MOTION AND FLEXIBILITY EXERCISES: STRETCH - Extension, Prone on Elbows   Lie on your stomach on the floor, a bed will be too soft. Place your palms about shoulder width apart and at the height of your head.  Place your elbows under your shoulders. If this is too painful, stack pillows under your chest.  Allow your body to relax so that your hips drop lower and make contact more completely with the floor.  Hold this position for __________ seconds.  Slowly return to lying flat on the floor. Repeat __________ times. Complete this exercise __________ times per day.  RANGE OF MOTION - Extension, Prone Press Ups  Lie on your stomach on the floor, a bed will be too soft. Place your palms about shoulder width apart and at the height of your head.  Keeping your back as relaxed as possible, slowly straighten your elbows while keeping your hips on the floor. You may adjust the placement of your hands to maximize your comfort. As you gain motion, your hands will come more underneath your shoulders.  Hold this position  __________ seconds.  Slowly return to lying flat on the floor. Repeat __________ times. Complete this exercise __________ times per day.  RANGE OF MOTION- Quadruped, Neutral Spine   Assume a hands and knees position on a firm surface. Keep your hands under your shoulders and your knees under your hips. You may place padding under your knees for comfort.  Drop your head and point your tailbone toward the ground below you. This will round out your lower back like an angry cat. Hold this position for __________ seconds.  Slowly lift your head and release your tail bone so that your back sags into a large arch, like an old horse.  Hold this position for __________ seconds.  Repeat this until you feel limber in your low back.  Now, find your "sweet spot." This will be the most comfortable position somewhere between the two previous positions. This is your neutral spine. Once you have found this position, tense your stomach muscles to support  your low back.  Hold this position for __________ seconds. Repeat __________ times. Complete this exercise __________ times per day.  STRENGTHENING EXERCISES - Low Back Sprain These exercises may help you when beginning to rehabilitate your injury. These exercises should be done near your "sweet spot." This is the neutral, low-back arch, somewhere between fully rounded and fully arched, that is your least painful position. When performed in this safe range of motion, these exercises can be used for people who have either a flexion or extension based injury. These exercises may resolve your symptoms with or without further involvement from your physician, physical therapist or athletic trainer. While completing these exercises, remember:   Muscles can gain both the endurance and the strength needed for everyday activities through controlled exercises.  Complete these exercises as instructed by your physician, physical therapist or athletic trainer. Increase  the resistance and repetitions only as guided.  You may experience muscle soreness or fatigue, but the pain or discomfort you are trying to eliminate should never worsen during these exercises. If this pain does worsen, stop and make certain you are following the directions exactly. If the pain is still present after adjustments, discontinue the exercise until you can discuss the trouble with your caregiver. STRENGTHENING - Deep Abdominals, Pelvic Tilt   Lie on a firm bed or floor. Keeping your legs in front of you, bend your knees so they are both pointed toward the ceiling and your feet are flat on the floor.  Tense your lower abdominal muscles to press your low back into the floor. This motion will rotate your pelvis so that your tail bone is scooping upwards rather than pointing at your feet or into the floor. With a gentle tension and even breathing, hold this position for __________ seconds. Repeat __________ times. Complete this exercise __________ times per day.  STRENGTHENING - Abdominals, Crunches   Lie on a firm bed or floor. Keeping your legs in front of you, bend your knees so they are both pointed toward the ceiling and your feet are flat on the floor. Cross your arms over your chest.  Slightly tip your chin down without bending your neck.  Tense your abdominals and slowly lift your trunk high enough to just clear your shoulder blades. Lifting higher can put excessive stress on the lower back and does not further strengthen your abdominal muscles.  Control your return to the starting position. Repeat __________ times. Complete this exercise __________ times per day.  STRENGTHENING - Quadruped, Opposite UE/LE Lift   Assume a hands and knees position on a firm surface. Keep your hands under your shoulders and your knees under your hips. You may place padding under your knees for comfort.  Find your neutral spine and gently tense your abdominal muscles so that you can maintain this  position. Your shoulders and hips should form a rectangle that is parallel with the floor and is not twisted.  Keeping your trunk steady, lift your right hand no higher than your shoulder and then your left leg no higher than your hip. Make sure you are not holding your breath. Hold this position for __________ seconds.  Continuing to keep your abdominal muscles tense and your back steady, slowly return to your starting position. Repeat with the opposite arm and leg. Repeat __________ times. Complete this exercise __________ times per day.  STRENGTHENING - Abdominals and Quadriceps, Straight Leg Raise   Lie on a firm bed or floor with both legs extended in front of  you.  Keeping one leg in contact with the floor, bend the other knee so that your foot can rest flat on the floor.  Find your neutral spine, and tense your abdominal muscles to maintain your spinal position throughout the exercise.  Slowly lift your straight leg off the floor about 6 inches for a count of 15, making sure to not hold your breath.  Still keeping your neutral spine, slowly lower your leg all the way to the floor. Repeat this exercise with each leg __________ times. Complete this exercise __________ times per day. POSTURE AND BODY MECHANICS CONSIDERATIONS - Low Back Sprain Keeping correct posture when sitting, standing or completing your activities will reduce the stress put on different body tissues, allowing injured tissues a chance to heal and limiting painful experiences. The following are general guidelines for improved posture. Your physician or physical therapist will provide you with any instructions specific to your needs. While reading these guidelines, remember:  The exercises prescribed by your provider will help you have the flexibility and strength to maintain correct postures.  The correct posture provides the best environment for your joints to work. All of your joints have less wear and tear when  properly supported by a spine with good posture. This means you will experience a healthier, less painful body.  Correct posture must be practiced with all of your activities, especially prolonged sitting and standing. Correct posture is as important when doing repetitive low-stress activities (typing) as it is when doing a single heavy-load activity (lifting). RESTING POSITIONS Consider which positions are most painful for you when choosing a resting position. If you have pain with flexion-based activities (sitting, bending, stooping, squatting), choose a position that allows you to rest in a less flexed posture. You would want to avoid curling into a fetal position on your side. If your pain worsens with extension-based activities (prolonged standing, working overhead), avoid resting in an extended position such as sleeping on your stomach. Most people will find more comfort when they rest with their spine in a more neutral position, neither too rounded nor too arched. Lying on a non-sagging bed on your side with a pillow between your knees, or on your back with a pillow under your knees will often provide some relief. Keep in mind, being in any one position for a prolonged period of time, no matter how correct your posture, can still lead to stiffness. PROPER SITTING POSTURE In order to minimize stress and discomfort on your spine, you must sit with correct posture. Sitting with good posture should be effortless for a healthy body. Returning to good posture is a gradual process. Many people can work toward this most comfortably by using various supports until they have the flexibility and strength to maintain this posture on their own. When sitting with proper posture, your ears will fall over your shoulders and your shoulders will fall over your hips. You should use the back of the chair to support your upper back. Your lower back will be in a neutral position, just slightly arched. You may place a small  pillow or folded towel at the base of your lower back for  support.  When working at a desk, create an environment that supports good, upright posture. Without extra support, muscles tire, which leads to excessive strain on joints and other tissues. Keep these recommendations in mind: CHAIR:  A chair should be able to slide under your desk when your back makes contact with the back of the chair.  This allows you to work closely.  The chair's height should allow your eyes to be level with the upper part of your monitor and your hands to be slightly lower than your elbows. BODY POSITION  Your feet should make contact with the floor. If this is not possible, use a foot rest.  Keep your ears over your shoulders. This will reduce stress on your neck and low back. INCORRECT SITTING POSTURES  If you are feeling tired and unable to assume a healthy sitting posture, do not slouch or slump. This puts excessive strain on your back tissues, causing more damage and pain. Healthier options include:  Using more support, like a lumbar pillow.  Switching tasks to something that requires you to be upright or walking.  Talking a brief walk.  Lying down to rest in a neutral-spine position. PROLONGED STANDING WHILE SLIGHTLY LEANING FORWARD  When completing a task that requires you to lean forward while standing in one place for a long time, place either foot up on a stationary 2-4 inch high object to help maintain the best posture. When both feet are on the ground, the lower back tends to lose its slight inward curve. If this curve flattens (or becomes too large), then the back and your other joints will experience too much stress, tire more quickly, and can cause pain. CORRECT STANDING POSTURES Proper standing posture should be assumed with all daily activities, even if they only take a few moments, like when brushing your teeth. As in sitting, your ears should fall over your shoulders and your shoulders should  fall over your hips. You should keep a slight tension in your abdominal muscles to brace your spine. Your tailbone should point down to the ground, not behind your body, resulting in an over-extended swayback posture.  INCORRECT STANDING POSTURES  Common incorrect standing postures include a forward head, locked knees and/or an excessive swayback. WALKING Walk with an upright posture. Your ears, shoulders and hips should all line-up. PROLONGED ACTIVITY IN A FLEXED POSITION When completing a task that requires you to bend forward at your waist or lean over a low surface, try to find a way to stabilize 3 out of 4 of your limbs. You can place a hand or elbow on your thigh or rest a knee on the surface you are reaching across. This will provide you more stability, so that your muscles do not tire as quickly. By keeping your knees relaxed, or slightly bent, you will also reduce stress across your lower back. CORRECT LIFTING TECHNIQUES DO :  Assume a wide stance. This will provide you more stability and the opportunity to get as close as possible to the object which you are lifting.  Tense your abdominals to brace your spine. Bend at the knees and hips. Keeping your back locked in a neutral-spine position, lift using your leg muscles. Lift with your legs, keeping your back straight.  Test the weight of unknown objects before attempting to lift them.  Try to keep your elbows locked down at your sides in order get the best strength from your shoulders when carrying an object.  Always ask for help when lifting heavy or awkward objects. INCORRECT LIFTING TECHNIQUES DO NOT:   Lock your knees when lifting, even if it is a small object.  Bend and twist. Pivot at your feet or move your feet when needing to change directions.  Assume that you can safely pick up even a paperclip without proper posture.   This  information is not intended to replace advice given to you by your health care provider. Make  sure you discuss any questions you have with your health care provider.   Document Released: 12/16/2005 Document Revised: 01/06/2015 Document Reviewed: 03/30/2009 Elsevier Interactive Patient Education [employer].

## 2016-08-13 NOTE — ED Triage Notes (Signed)
Patient presents to the ED after her pcp called her and told her she needed to come to the ED because her hemoglobin was 7.3.  Patient states she is having left hip pain and back pain which is why she went to her PCP but that she had noticed that she has been more pale, tired, and dizzier than usual.

## 2016-08-13 NOTE — ED Notes (Signed)
MD at bedside. 

## 2016-08-13 NOTE — Assessment & Plan Note (Addendum)
Pending x-ray of lumbar spine and bilateral hips and pelvis. States prednisone worked well for her , left hip pain particularly; I will try short course of prednisone. Also [facility] patient short supply of Flexeril to use as needed for low back pain as this has helped in the past. Discussed physical therapy as an adjunct to prednisone does not resolve.

## 2016-08-13 NOTE — H&P (Signed)
[place] [place] NAME: Brandy Armstrong    MR#:  [id number]  DATE OF BIRTH:  Oct 15, 1946  DATE OF ADMISSION:  08/13/2016  PRIMARY CARE PHYSICIAN: Mable Paris, FNP   REQUESTING/REFERRING PHYSICIAN: Dr. Meade Maw  CHIEF COMPLAINT:   Chief Complaint  Patient presents with  . Abnormal Lab  . Anemia  . Dizziness    HISTORY OF PRESENT ILLNESS:  Brandy Armstrong  is a 70 y.o. female with a known history of  Afib on eliquis, chronic anemia, COPD not on home oxygen, GERD, rheumatoid arthritis Since to the hospital secondary to significant weakness and dizziness. Patient has musculoskeletal back pain, she has seen her PCP for this same yesterday. At the time she complained of weakness and dizziness going on for the past couple weeks now. Labs done showed a hemoglobin of 7 so she was sent in for symptomatic anemia. Patient has baseline hemoglobin between 8 and 9. She was admitted last year for anemia requiring transfusion. At the time she had an EGD and colonoscopy done which did not show any GI source of bleeding. Her stool for occult blood in the ER is negative. Denies any fevers, chills, nausea and vomiting. No melena, hematemesis or rectal bleed.  PAST MEDICAL HISTORY:   Past Medical History:  Diagnosis Date  . Atrial fibrillation [facility])    a. s/p ablation 01/2012 in [facility] by Dr. Boyd Kerbs;  b. On sotalol & Xarelto;  c. 02/2014 Echo: EF 50-55%, mild conc LVH, nl LA size/structure;  d. Recurrent afib 8/15 & 08/29/2014.  . Barretts esophagus   . Chest pain    a. 02/2014 Myoview: Ef 50%, no ischemia.  . Colon polyps   . COPD (chronic obstructive pulmonary disease) ([place])   . COPD (chronic obstructive pulmonary disease) ([place])   . Depression with anxiety   . GERD (gastroesophageal reflux disease)   . Neuropathy ([place])   . Rectal fistula   . Rheumatoid arthritis ([place])    On methotrexate and orencia  . Urinary incontinence   . Vitamin D deficiency      PAST SURGICAL HISTORY:   Past Surgical History:  Procedure Laterality Date  . ABDOMINAL HYSTERECTOMY  1992  . CARDIAC ELECTROPHYSIOLOGY STUDY AND ABLATION  2013  . CHOLECYSTECTOMY  1987  . COLONOSCOPY N/A 05/08/2015   Procedure: COLONOSCOPY;  Surgeon: Manya Silvas, MD;  Location: [facility] ENDOSCOPY;  Service: Endoscopy;  Laterality: N/A;  . ESOPHAGOGASTRODUODENOSCOPY N/A 05/06/2015   Procedure: ESOPHAGOGASTRODUODENOSCOPY (EGD);  Surgeon: Lollie Sails, MD;  Location: [facility] ENDOSCOPY;  Service: Endoscopy;  Laterality: N/A;  . oophrectomy Bilateral 1992    SOCIAL HISTORY:   Social History  Substance Use Topics  . Smoking status: Former Smoker    Packs/day: 1.00    Years: 40.00    Quit date: 12/16/2011  . Smokeless tobacco: Never Used  . Alcohol use 0.0 oz/week     Comment: Occasionally has a drink    FAMILY HISTORY:   Family History  Problem Relation Age of Onset  . Depression Mother   . Cancer Mother 39    pancreatic cancer  . Cancer Father 74    colon cancer  . Cancer Sister 30    breast cancer  . Breast cancer Sister 79  . Diabetes Brother   . Neuropathy Neg Hx     DRUG ALLERGIES:   Allergies  Allergen Reactions  . Latex     REVIEW OF SYSTEMS:   Review  of Systems  Constitutional: Positive for malaise/fatigue. Negative for chills, fever and weight loss.  HENT: Negative for ear discharge, ear pain, hearing loss and nosebleeds.   Eyes: Negative for blurred vision, double vision and photophobia.  Respiratory: Negative for cough, hemoptysis, shortness of breath and wheezing.   Cardiovascular: Negative for chest pain, palpitations, orthopnea and leg swelling.  Gastrointestinal: Negative for abdominal pain, constipation, diarrhea, heartburn, melena, nausea and vomiting.  Genitourinary: Negative for dysuria, frequency, hematuria and urgency.  Musculoskeletal: Positive for back pain, joint pain and myalgias. Negative for neck pain.  Skin: Negative for rash.    Neurological: Positive for dizziness and weakness. Negative for tingling, tremors, sensory change, speech change, focal weakness and headaches.  Endo/Heme/Allergies: Does not bruise/bleed easily.  Psychiatric/Behavioral: Negative for depression.    MEDICATIONS AT HOME:   Prior to Admission medications   Medication Sig Start Date End Date Taking? Authorizing Provider  amiodarone (PACERONE) 200 MG tablet Take 200 mg by mouth daily.   Yes Historical Provider, MD  apixaban (ELIQUIS) 5 MG TABS tablet Take 5 mg by mouth 2 (two) times daily.   Yes Historical Provider, MD  budesonide-formoterol (SYMBICORT) 160-4.5 MCG/ACT inhaler Inhale 2 puffs into the lungs 2 (two) times daily.   Yes Historical Provider, MD  cyclobenzaprine (FLEXERIL) 10 MG tablet Take 1 tablet (10 mg total) by mouth at bedtime. 08/13/16  Yes [name], FNP  escitalopram (LEXAPRO) 10 MG tablet Take 1 tablet (10 mg total) by mouth daily. 04/05/16  Yes Rubbie Battiest, NP  esomeprazole (NEXIUM) 40 MG capsule Take 40 mg by mouth daily at 12 noon.   Yes Historical Provider, MD  hydroxychloroquine (PLAQUENIL) 200 MG tablet Take 200 mg by mouth. 06/21/16  Yes Historical Provider, MD  leflunomide (ARAVA) 20 MG tablet Take 20 mg by mouth daily. 01/09/15  Yes Historical Provider, MD  metoprolol succinate (TOPROL-XL) 25 MG 24 hr tablet Take 25 mg by mouth 2 (two) times daily.  04/06/15  Yes Historical Provider, MD  predniSONE (DELTASONE) 10 MG tablet Take 40 mg by mouth on day 1, then taper 10 mg daily until gone 08/13/16  Yes [name], FNP  pregabalin (LYRICA) 100 MG capsule Take 1 capsule (100 mg total) by mouth 3 (three) times daily as needed. 05/20/16  Yes Coral Spikes, DO  Tiotropium Bromide Monohydrate (SPIRIVA RESPIMAT) 2.5 MCG/ACT AERS Inhale 2 puffs into the lungs daily.   Yes Historical Provider, MD  topiramate (TOPAMAX) 25 MG tablet Take 50 mg by mouth at bedtime.  04/01/16  Yes Historical Provider, MD  albuterol (PROVENTIL  HFA;VENTOLIN HFA) 108 (90 Base) MCG/ACT inhaler Inhale into the lungs. 02/21/16 02/20/17  Historical Provider, MD  clonazePAM (KLONOPIN) 0.5 MG tablet TAKE 1 TABLET BY MOUTH 3 TIMES DAILY AS NEEDED FOR ANXIETY 03/05/16   Rubbie Battiest, NP  ELIQUIS 5 MG TABS tablet TAKE 1 TABLET (5 MG TOTAL) BY MOUTH TWO (2) TIMES A DAY. 06/28/16   Historical Provider, MD  esomeprazole (NEXIUM) 40 MG capsule TAKE ONE CAPSULE BY MOUTH DAILY AT 12 NOON 05/04/16   Rubbie Battiest, NP  lidocaine (XYLOCAINE) 5 % ointment APPLY 1 APPLICATION TOPICALLY AS NEEDED. 06/26/16   Melvenia Beam, MD  lidocaine (XYLOCAINE) 5 % ointment Apply 1 application topically as needed.    Historical Provider, MD  SPIRIVA RESPIMAT 2.5 MCG/ACT AERS INHALE 2 PUFFS BY MOUTH DAILY. 06/26/16   Historical Provider, MD  sulfamethoxazole-trimethoprim (BACTRIM DS,SEPTRA DS) 800-160 MG tablet Take 1 tablet  by mouth 2 (two) times daily. Patient not taking: Reported on 08/13/2016 08/06/16   [name], FNP  SYMBICORT 160-4.5 MCG/ACT inhaler INHALE 2 PUFFS TWO (2) TIMES A DAY. 07/26/16   [name], FNP      VITAL SIGNS:  Blood pressure 121/78, pulse 77, temperature 98.2 F (36.8 C), temperature source Oral, resp. rate 16, height 5' 6.5" (1.689 m), weight 94.3 kg (208 lb), SpO2 100 %.  PHYSICAL EXAMINATION:   Physical Exam  GENERAL:  70 y.o.-year-old patient lying in the bed with no acute distress.  EYES: Pupils equal, round, reactive to light and accommodation. No scleral icterus. Pale conjunctivae. Extraocular muscles intact.  HEENT: Head atraumatic, normocephalic. Oropharynx and nasopharynx clear.  NECK:  Supple, no jugular venous distention. No thyroid enlargement, no tenderness.  LUNGS: Normal breath sounds bilaterally, no wheezing, rales,rhonchi or crepitation. No use of accessory muscles of respiration.  CARDIOVASCULAR: S1, S2 normal. No murmurs, rubs, or gallops.  ABDOMEN: Soft, nontender, nondistended. Bowel sounds present. No  organomegaly or mass.  EXTREMITIES: No cyanosis, or clubbing. 1+ pitting edema NEUROLOGIC: Cranial nerves II through XII are intact. Muscle strength 5/5 in all extremities. Sensation intact. Gait not checked.  PSYCHIATRIC: The patient is alert and oriented x 3.  SKIN: No obvious rash, lesion, or ulcer.   LABORATORY PANEL:   CBC  Recent Labs Lab 08/13/16 1820  WBC 5.3  HGB 7.2*  HCT 23.9*  PLT 206   ------------------------------------------------------------------------------------------------------------------  Chemistries   Recent Labs Lab 08/13/16 1820  NA 136  K 3.6  CL 110  CO2 18*  GLUCOSE 132*  BUN 16  CREATININE 0.99  CALCIUM 8.8*   ------------------------------------------------------------------------------------------------------------------  Cardiac Enzymes No results for input(s): TROPONINI in the last 168 hours. ------------------------------------------------------------------------------------------------------------------  RADIOLOGY:  Dg Lumbar Spine Complete  Result Date: 08/13/2016 CLINICAL DATA:  Bilateral back pain.  No sciatica. EXAM: LUMBAR SPINE - COMPLETE 4+ VIEW COMPARISON:  CT 01/20/2011. FINDINGS: Mild scoliosis concave left. Degenerative changes lumbar spine and both hips. 3 mm anterolisthesis L4 on L5. 3 mm retrolisthesis L1 on L2. Aortoiliac atherosclerotic vascular disease. IMPRESSION: 1. Diffuse multilevel degenerative change with scoliosis concave left. No evidence of fracture. 2.  3 mm anterolisthesis L4 on L5.  3 mm retrolisthesis L1 on L2. 3.  Aortoiliac atherosclerotic vascular disease. Electronically Signed   By: Marcello Moores  Register   On: 08/13/2016 13:13   Dg Hips Bilat With Pelvis 3-4 Views  Result Date: 08/13/2016 CLINICAL DATA:  Bilateral low back pain without sciatica. EXAM: DG HIP (WITH OR WITHOUT PELVIS) 3-4V BILAT COMPARISON:  CT 01/21/2016 . FINDINGS: Degenerative changes lumbar spine and both hips. No acute bony or joint  abnormality identified. No evidence of fracture or dislocation. Pelvic calcifications noted consistent phleboliths. IMPRESSION: Degenerative changes lumbar spine and both hips. No acute bony abnormality identified. Electronically Signed   By: Marcello Moores  Register   On: 08/13/2016 13:14    EKG:   Orders placed or performed during the hospital encounter of 08/13/16  . ED EKG  . ED EKG    IMPRESSION AND PLAN:   Brandy Armstrong  is a 70 y.o. female with a known history of  Afib on eliquis, chronic anemia, COPD not on home oxygen, GERD, rheumatoid arthritis Since to the hospital secondary to significant weakness and dizziness.  #1 symptomatic anemia- check anemia labs, EGD and colonoscopy with no significant cause identified last year - 1 unit Tx ordered in ER - recheck hemoglobin, admitted under observation. -Holding eliquis  #  2 low back pain-musculoskeletal. Negative straight leg raising test. -Resolved now. Physical therapy consult.  #3 chronic atrial fibrillation-rate controlled. On Toprol, amiodarone. -Due to anemia, holding eliquis at this time.  #4 COPD-not on home oxygen. Stable. Continue inhalers  #5 rheumatoid arthritis-continue outpatient medications.  #6 DVT prophylaxis-Ted's and SCDs     All the records are reviewed and case discussed with ED provider. Management plans discussed with the patient, family and they are in agreement.  CODE STATUS: Full Code  TOTAL TIME TAKING CARE OF THIS PATIENT: 50 minutes.    Gladstone Lighter M.D on 08/13/2016 at 9:11 PM  Between 7am to 6pm - Pager - [phone]  After 6pm go to [email or web address] - Proofreader  [facility]  Office  [phone]  CC: Primary care physician; Mable Paris, FNP

## 2016-08-14 ENCOUNTER — Encounter: Payer: Self-pay | Admitting: Family

## 2016-08-14 DIAGNOSIS — M069 Rheumatoid arthritis, unspecified: Secondary | ICD-10-CM | POA: Diagnosis not present

## 2016-08-14 DIAGNOSIS — J449 Chronic obstructive pulmonary disease, unspecified: Secondary | ICD-10-CM | POA: Diagnosis not present

## 2016-08-14 DIAGNOSIS — D649 Anemia, unspecified: Secondary | ICD-10-CM | POA: Diagnosis not present

## 2016-08-14 DIAGNOSIS — I10 Essential (primary) hypertension: Secondary | ICD-10-CM | POA: Diagnosis not present

## 2016-08-14 LAB — BASIC METABOLIC PANEL
ANION GAP: 9 (ref 5–15)
BUN: 14 mg/dL (ref 6–20)
CHLORIDE: 110 mmol/L (ref 101–111)
CO2: 20 mmol/L — ABNORMAL LOW (ref 22–32)
Calcium: 8.6 mg/dL — ABNORMAL LOW (ref 8.9–10.3)
Creatinine, Ser: 0.88 mg/dL (ref 0.44–1.00)
GFR calc Af Amer: >60 mL/min (ref >60)
Glucose, Bld: 80 mg/dL (ref 65–99)
POTASSIUM: 3.5 mmol/L (ref 3.5–5.1)
SODIUM: 139 mmol/L (ref 135–145)

## 2016-08-14 LAB — FERRITIN: FERRITIN: 9 ng/mL — AB (ref 11–307)

## 2016-08-14 LAB — CBC
HEMATOCRIT: 27.4 % — AB (ref 35.0–47.0)
HEMOGLOBIN: 8.5 g/dL — AB (ref 12.0–16.0)
MCH: 19.6 pg — ABNORMAL LOW (ref 26.0–34.0)
MCHC: 30.9 g/dL — ABNORMAL LOW (ref 32.0–36.0)
MCV: 63.3 fL — AB (ref 80.0–100.0)
Platelets: 178 10*3/uL (ref 150–440)
RBC: 4.33 MIL/uL (ref 3.80–5.20)
RDW: 23.2 % — ABNORMAL HIGH (ref 11.5–14.5)
WBC: 8.4 10*3/uL (ref 3.6–11.0)

## 2016-08-14 LAB — IRON AND TIBC
Iron: 12 ug/dL — ABNORMAL LOW (ref 28–170)
SATURATION RATIOS: 3 % — AB (ref 10.4–31.8)
TIBC: 399 ug/dL (ref 250–450)
UIBC: 387 ug/dL

## 2016-08-14 LAB — PREPARE RBC (CROSSMATCH)

## 2016-08-14 LAB — VITAMIN B12
VITAMIN B 12: 326 pg/mL (ref 180–914)
VITAMIN B 12: 266 pg/mL (ref 180–914)

## 2016-08-14 LAB — FOLATE: FOLATE: 9 ng/mL (ref >5.9)

## 2016-08-14 MED ORDER — ONDANSETRON HCL 4 MG PO TABS: 4.0000 mg | ORAL_TABLET | Freq: Four times a day (QID) | ORAL | 0 refills | Status: DC | PRN

## 2016-08-14 MED ORDER — SODIUM CHLORIDE 0.9 % IV SOLN
Freq: Once | INTRAVENOUS | Status: AC
  Administered 2016-08-14: 07:00:00 via INTRAVENOUS

## 2016-08-14 NOTE — Care Management Obs Status (Signed)
[place] NOTIFICATION   Patient Details  Name: Brandy Armstrong MRN: [id number] Date of Birth: 03-08-1946   Medicare Observation Status Notification Given:  Yes    Katrina Stack, RN 08/14/2016, 10:49 AM

## 2016-08-14 NOTE — Discharge Instructions (Signed)

## 2016-08-14 NOTE — Progress Notes (Signed)
Notified Dr Ara Kussmaul of order for blood transfusion being discontinued. Received order to transfuse 2nd unit of blood.

## 2016-08-14 NOTE — Progress Notes (Signed)
Order to transfuse 2 units of blood had been discontinued by ED doctor. I didn't realize it had been discontinued until I had spiked the blood. Notifed Dr Ara Kussmaul she said to transfuse 2nd unit. Returned blood back to lab and placed order to crossmatch 1 unit of PRBC

## 2016-08-14 NOTE — Progress Notes (Signed)
Shift assessment was completed at 0830, see flowsheet. Pt had prc's infusing at that time into piv #20 in her lac. Pt showed this writer the top of her l hand that she pointed out was swollen. Site warm and soft atthat time, no edema to her fingers noted, pt able to move all fingers with cap refill wnl. Pt stated that an iv site used to be there. Prc's finished,and practitioner rounded, gave order to d/c pt. This writer removed PIV from [facility] with catheter intact, pt tolerated well. Telebox and o2 sat probe removed as well. Telebox had been showing some a fib. D/c instructions were reviewed with pt, who signed and received copy. Pt dc'd via wc at this time to main entrance of facility. Of note, pt again pointed out the top of her l hand to this Probation officer. Site has some edema, is slightly pink toward her wrist, is warm and soft without appearing inflamed or darkly reddened. Pt stated she was unable to bend her fingers, but this writer witnessed pt bending her fingers forward. No edema to her fingers noted. This Probation officer advised pt that if this does not resolve to call her provider or return to Ed, she may want to try ice for a short period of time at home for some relief, elevated her hand, etc.

## 2016-08-14 NOTE — Discharge Summary (Signed)
Brandy Armstrong, 70 y.o., DOB [birth year], MRN [id number]. Admission date: 08/13/2016 Discharge Date 08/14/2016 Primary MD Mable Paris, FNP Admitting Physician Gladstone Lighter, MD  Admission Diagnosis  Symptomatic anemia [D64.9]  Discharge Diagnosis   Active Problems:  Symptomatic anemia due to exacerbation of her chronic anemia  Atrial fibrillation  Barretts Esophagus  COPD  Depression and anxiety  GERD Anxiety and depression History of rectal fistula Rheumatoid arthritis   Hospital Course Brandy Armstrong  is a 70 y.o. female with a known history of  Afib on eliquis, chronic anemia, COPD not on home oxygen, GERD, rheumatoid arthritis Since to the hospital secondary to significant weakness and dizziness. Patient chronically has anemia with a hemoglobin of 9 in the past her hemoglobin checked was noted to be much lower. Therefore she was admitted for symptomatic anemia. Patient has been evaluated with EGD and capsule endoscopy in the past which has been negative. She has no evidence of GI bleeding. She denies any chest pain or shortness of breath feels better ready to go home.             Consults  None  Significant Tests:  See full reports for all details     Dg Lumbar Spine Complete  Result Date: 08/13/2016 CLINICAL DATA:  Bilateral back pain.  No sciatica. EXAM: LUMBAR SPINE - COMPLETE 4+ VIEW COMPARISON:  CT 01/20/2011. FINDINGS: Mild scoliosis concave left. Degenerative changes lumbar spine and both hips. 3 mm anterolisthesis L4 on L5. 3 mm retrolisthesis L1 on L2. Aortoiliac atherosclerotic vascular disease. IMPRESSION: 1. Diffuse multilevel degenerative change with scoliosis concave left. No evidence of fracture. 2.  3 mm anterolisthesis L4 on L5.  3 mm retrolisthesis L1 on L2. 3.  Aortoiliac atherosclerotic vascular disease. Electronically Signed   By: Marcello Moores  Register   On: 08/13/2016 13:13   Dg Hips Bilat With Pelvis 3-4 Views  Result Date: 08/13/2016 CLINICAL DATA:   Bilateral low back pain without sciatica. EXAM: DG HIP (WITH OR WITHOUT PELVIS) 3-4V BILAT COMPARISON:  CT 01/21/2016 . FINDINGS: Degenerative changes lumbar spine and both hips. No acute bony or joint abnormality identified. No evidence of fracture or dislocation. Pelvic calcifications noted consistent phleboliths. IMPRESSION: Degenerative changes lumbar spine and both hips. No acute bony abnormality identified. Electronically Signed   By: Marcello Moores  Register   On: 08/13/2016 13:14       Today   Subjective:   Brandy Armstrong   patient feels well and wants to go home  Objective:   Blood pressure (!) 99/57, pulse 72, temperature 98.3 F (36.8 C), temperature source Oral, resp. rate 18, height 5' 6.5" (1.689 m), weight 94.3 kg (208 lb), SpO2 96 %.  .  Intake/Output Summary (Last 24 hours) at 08/14/16 1452 Last data filed at 08/14/16 0925  Gross per 24 hour  Intake             1870 ml  Output                0 ml  Net             1870 ml    Exam VITAL SIGNS: Blood pressure (!) 99/57, pulse 72, temperature 98.3 F (36.8 C), temperature source Oral, resp. rate 18, height 5' 6.5" (1.689 m), weight 94.3 kg (208 lb), SpO2 96 %.  GENERAL:  70 y.o.-year-old patient lying in the bed with no acute distress.  EYES: Pupils equal, round, reactive to light and accommodation. No scleral icterus. Extraocular muscles intact.  HEENT:  Head atraumatic, normocephalic. Oropharynx and nasopharynx clear.  NECK:  Supple, no jugular venous distention. No thyroid enlargement, no tenderness.  LUNGS: Normal breath sounds bilaterally, no wheezing, rales,rhonchi or crepitation. No use of accessory muscles of respiration.  CARDIOVASCULAR: S1, S2 normal. No murmurs, rubs, or gallops.  ABDOMEN: Soft, nontender, nondistended. Bowel sounds present. No organomegaly or mass.  EXTREMITIES: No pedal edema, cyanosis, or clubbing.  NEUROLOGIC: Cranial nerves II through XII are intact. Muscle strength 5/5 in all extremities.  Sensation intact. Gait not checked.  PSYCHIATRIC: The patient is alert and oriented x 3.  SKIN: No obvious rash, lesion, or ulcer.   Data Review     CBC w Diff: Lab Results  Component Value Date   WBC 8.4 08/14/2016   HGB 8.5 (L) 08/14/2016   HGB 9.4 (L) 01/04/2015   HCT 27.4 (L) 08/14/2016   HCT 30.5 (L) 01/04/2015   PLT 178 08/14/2016   PLT 176 01/04/2015   LYMPHOPCT 12.0 08/13/2016   LYMPHOPCT 25.7 01/04/2015   MONOPCT 2.9 (L) 08/13/2016   MONOPCT 7.0 01/04/2015   EOSPCT 0.0 08/13/2016   EOSPCT 1.3 01/04/2015   BASOPCT 0.3 08/13/2016   BASOPCT 0.8 01/04/2015   CMP: Lab Results  Component Value Date   NA 139 08/14/2016   NA 141 01/02/2016   NA 139 01/04/2015   K 3.5 08/14/2016   K 3.8 01/04/2015   CL 110 08/14/2016   CL 106 01/04/2015   CO2 20 (L) 08/14/2016   CO2 26 01/04/2015   BUN 14 08/14/2016   BUN 12 01/02/2016   BUN 8 01/04/2015   CREATININE 0.88 08/14/2016   CREATININE 0.84 01/04/2015   PROT 6.8 01/02/2016   PROT 7.2 08/29/2014   ALBUMIN 3.9 01/02/2016   ALBUMIN 2.6 (L) 08/29/2014   BILITOT 0.4 01/02/2016   BILITOT 0.3 08/29/2014   ALKPHOS 113 01/02/2016   ALKPHOS 115 08/29/2014   AST 18 01/02/2016   AST 37 08/29/2014   ALT 12 01/02/2016   ALT 18 08/29/2014  .  Micro Results No results found for this or any previous visit (from the past 240 hour(s)).      Code Status Orders        Start     Ordered   08/13/16 2338  Full code  Continuous     08/13/16 2337    Code Status History    Date Active Date Inactive Code Status Order ID Comments User Context   05/04/2015  1:55 PM 05/08/2015  9:16 PM Full Code [id number]  Loletha Grayer, MD ED    Advance Directive Documentation   [place] Most Recent Value  Type of Advance Directive  Living will  Pre-existing out of facility DNR order (yellow form or pink MOST form)  No data  "MOST" Form in Place?  No data          Follow-up Information    Mable Paris, FNP On 08/19/2016.    Specialty:  Family Medicine Why:  Appointment is at 1200 Contact information: [address] [place] [address] [phone]           Discharge Medications     Medication List    TAKE these medications   albuterol 108 (90 Base) MCG/ACT inhaler Commonly known as:  PROVENTIL HFA;VENTOLIN HFA Inhale into the lungs.   amiodarone 200 MG tablet Commonly known as:  PACERONE Take 200 mg by mouth daily.   clonazePAM 0.5 MG tablet Commonly known as:  KLONOPIN TAKE 1 TABLET  BY MOUTH 3 TIMES DAILY AS NEEDED FOR ANXIETY   cyclobenzaprine 10 MG tablet Commonly known as:  FLEXERIL Take 1 tablet (10 mg total) by mouth at bedtime.   ELIQUIS 5 MG Tabs tablet Generic drug:  apixaban Take 5 mg by mouth 2 (two) times daily.   ELIQUIS 5 MG Tabs tablet Generic drug:  apixaban TAKE 1 TABLET (5 MG TOTAL) BY MOUTH TWO (2) TIMES A DAY.   escitalopram 10 MG tablet Commonly known as:  LEXAPRO Take 1 tablet (10 mg total) by mouth daily.   esomeprazole 40 MG capsule Commonly known as:  NEXIUM Take 40 mg by mouth daily at 12 noon.   esomeprazole 40 MG capsule Commonly known as:  NEXIUM TAKE ONE CAPSULE BY MOUTH DAILY AT 12 NOON   hydroxychloroquine 200 MG tablet Commonly known as:  PLAQUENIL Take 200 mg by mouth.   leflunomide 20 MG tablet Commonly known as:  ARAVA Take 20 mg by mouth daily.   lidocaine 5 % ointment Commonly known as:  XYLOCAINE Apply 1 application topically as needed.   lidocaine 5 % ointment Commonly known as:  XYLOCAINE APPLY 1 APPLICATION TOPICALLY AS NEEDED.   metoprolol succinate 25 MG 24 hr tablet Commonly known as:  TOPROL-XL Take 25 mg by mouth 2 (two) times daily.   ondansetron 4 MG tablet Commonly known as:  ZOFRAN Take 1 tablet (4 mg total) by mouth every 6 (six) hours as needed for nausea.   predniSONE 10 MG tablet Commonly known as:  DELTASONE Take 40 mg by mouth on day 1, then taper 10 mg daily until gone   pregabalin  100 MG capsule Commonly known as:  LYRICA Take 1 capsule (100 mg total) by mouth 3 (three) times daily as needed.   SPIRIVA RESPIMAT 2.5 MCG/ACT Aers Generic drug:  Tiotropium Bromide Monohydrate Inhale 2 puffs into the lungs daily.   SPIRIVA RESPIMAT 2.5 MCG/ACT Aers Generic drug:  Tiotropium Bromide Monohydrate INHALE 2 PUFFS BY MOUTH DAILY.   sulfamethoxazole-trimethoprim 800-160 MG tablet Commonly known as:  BACTRIM DS,SEPTRA DS Take 1 tablet by mouth 2 (two) times daily.   budesonide-formoterol 160-4.5 MCG/ACT inhaler Commonly known as:  SYMBICORT Inhale 2 puffs into the lungs 2 (two) times daily.   SYMBICORT 160-4.5 MCG/ACT inhaler Generic drug:  budesonide-formoterol INHALE 2 PUFFS TWO (2) TIMES A DAY.   topiramate 25 MG tablet Commonly known as:  TOPAMAX Take 50 mg by mouth at bedtime.          Total Time in preparing paper work, data evaluation and todays exam - 35 minutes  Dustin Flock M.D on 08/14/2016 at 2:52 PM  [facility]   Office  [phone]

## 2016-08-14 NOTE — Evaluation (Signed)
Physical Therapy Evaluation Patient Details Name: Brandy Armstrong MRN: [id number] DOB: 12-Aug-1946 Today's Date: 08/14/2016   History of Present Illness  32 yofemale with onset of anemia was transfused 2 units of blood, now seen for PT assessment.  PHx:  a-fib, RA, chnonic anemia, back pain with scoliosis, atherosclerosis  Clinical Impression  Pt is up to walk and note minor changes of balance with SPC needed to correct her balance.  Her plan is to go to outpatient if her insurance covers, and pt is to look into the abiltiy to get there.  Follow acutely if pt stays another day.    Follow Up Recommendations Outpatient PT    Equipment Recommendations  Cane    Recommendations for Other Services Rehab consult     Precautions / Restrictions Precautions Precautions: Fall (telemetry and O2 sats ) Restrictions Weight Bearing Restrictions: No      Mobility  Bed Mobility Overal bed mobility: Modified Independent                Transfers Overall transfer level: Modified independent Equipment used: 1 person hand held assist             General transfer comment: used bed to push off for standing  Ambulation/Gait Ambulation/Gait assistance: Min guard Ambulation Distance (Feet): 300 Feet Assistive device: 1 person hand held assist (IV pole for first part of the walk) Gait Pattern/deviations: Step-through pattern;Wide base of support;Decreased stride length (minor shifting of COM laterally but no LOB) Gait velocity: reduced Gait velocity interpretation: Below normal speed for age/gender    Stairs            Wheelchair Mobility    Modified Rankin (Stroke Patients Only)       Balance Overall balance assessment: Needs assistance Sitting-balance support: Feet supported Sitting balance-Leahy Scale: Good   Postural control: Posterior lean Standing balance support: Single extremity supported Standing balance-Leahy Scale: Fair                                Pertinent Vitals/Pain Pain Assessment: No/denies pain    Home Living Family/patient expects to be discharged to:: Private residence Living Arrangements: Alone Available Help at Discharge: Friend(s);Family;Available PRN/intermittently Type of Home: Apartment Home Access: Level entry     Home Layout: One level Home Equipment: None      Prior Function Level of Independence: Independent               Hand Dominance        Extremity/Trunk Assessment   Upper Extremity Assessment: Overall WFL for tasks assessed           Lower Extremity Assessment: Overall WFL for tasks assessed      Cervical / Trunk Assessment: Kyphotic  Communication      Cognition Arousal/Alertness: Awake/alert Behavior During Therapy: WFL for tasks assessed/performed Overall Cognitive Status: Within Functional Limits for tasks assessed                      General Comments General comments (skin integrity, edema, etc.): Pt was up to walk with minor shifting initially and used IV pole bar to steady then decreased, minor SOB but O2 sats 97% from initial 92%    Exercises        Assessment/Plan    PT Assessment Patient needs continued PT services  PT Diagnosis Difficulty walking   PT Problem List Decreased strength;Decreased range of motion;Decreased activity  tolerance;Decreased balance;Decreased mobility;Decreased coordination;Decreased knowledge of use of DME;Decreased safety awareness;Cardiopulmonary status limiting activity;Obesity;Decreased skin integrity  PT Treatment Interventions DME instruction;Gait training;Functional mobility training;Therapeutic activities;Therapeutic exercise;Balance training;Neuromuscular re-education;Patient/family education   PT Goals (Current goals can be found in the Care Plan section) Acute Rehab PT Goals Patient Stated Goal: to get home, feel stronger PT Goal Formulation: With patient Time For Goal Achievement: 08/28/16 Potential to  Achieve Goals: Good    Frequency Min 2X/week   Barriers to discharge Decreased caregiver support home alone    Co-evaluation               End of Session   Activity Tolerance: Patient tolerated treatment well;Patient limited by fatigue Patient left: in bed;with call bell/phone within reach Nurse Communication: Mobility status    Functional Assessment Tool Used: clinical judgment Functional Limitation: Mobility: Walking and moving around Mobility: Walking and Moving Around Current Status [id number]): At least 20 percent but less than 40 percent impaired, limited or restricted Mobility: Walking and Moving Around Goal Status [phone]): At least 1 percent but less than 20 percent impaired, limited or restricted    Time: 0925-1000 PT Time Calculation (min) (ACUTE ONLY): 35 min   Charges:   PT Evaluation $PT Eval Moderate Complexity: 1 Procedure PT Treatments $Gait Training: 8-22 mins   PT G Codes:   PT G-Codes **NOT FOR INPATIENT CLASS** Functional Assessment Tool Used: clinical judgment Functional Limitation: Mobility: Walking and moving around Mobility: Walking and Moving Around Current Status [id number]): At least 20 percent but less than 40 percent impaired, limited or restricted Mobility: Walking and Moving Around Goal Status [phone]): At least 1 percent but less than 20 percent impaired, limited or restricted    Ramond Dial 08/14/2016, 10:14 AM    Mee Hives, PT MS Acute Rehab Dept. Number: [place] and [place]

## 2016-08-15 LAB — TYPE AND SCREEN
ABO/RH(D): O POS
ANTIBODY SCREEN: NEGATIVE
UNIT DIVISION: 0
UNIT DIVISION: 0
Unit division: 0

## 2016-08-16 ENCOUNTER — Telehealth: Payer: Self-pay | Admitting: Family

## 2016-08-16 NOTE — Telephone Encounter (Signed)
Please advise 

## 2016-08-16 NOTE — Telephone Encounter (Signed)
Pt called needing more explanation regarding lab results. Please advise?  Call pt @ [phone]. Thank you!

## 2016-08-18 NOTE — Telephone Encounter (Signed)
Please call patient-  Since she was in ED and appears to have been admitted to hospital, I would like her to have an appointment.   Can we schedule?  We can discuss labs then as I also want to repeat her CBC to ensure it is stable.

## 2016-08-19 ENCOUNTER — Ambulatory Visit (INDEPENDENT_AMBULATORY_CARE_PROVIDER_SITE_OTHER): Payer: Medicare Other | Admitting: Family

## 2016-08-19 ENCOUNTER — Encounter: Payer: Self-pay | Admitting: Family

## 2016-08-19 VITALS — BP 120/70 | HR 90 | Temp 98.4°F | Resp 16 | Wt 205.0 lb

## 2016-08-19 DIAGNOSIS — D6489 Other specified anemias: Secondary | ICD-10-CM

## 2016-08-19 LAB — CBC WITH DIFFERENTIAL/PLATELET
BASOS ABS: 0 10*3/uL (ref 0.0–0.1)
BASOS PCT: 0.4 % (ref 0.0–3.0)
EOS ABS: 0 10*3/uL (ref 0.0–0.7)
Eosinophils Relative: 0.2 % (ref 0.0–5.0)
HCT: 33.6 % — ABNORMAL LOW (ref 36.0–46.0)
Hemoglobin: 10.4 g/dL — ABNORMAL LOW (ref 12.0–15.0)
LYMPHS PCT: 13.7 % (ref 12.0–46.0)
Lymphs Abs: 1 10*3/uL (ref 0.7–4.0)
MCHC: 30.9 g/dL (ref 30.0–36.0)
MCV: 66.3 fl — ABNORMAL LOW (ref 78.0–100.0)
MONO ABS: 0.3 10*3/uL (ref 0.1–1.0)
Monocytes Relative: 4.2 % (ref 3.0–12.0)
NEUTROS ABS: 5.8 10*3/uL (ref 1.4–7.7)
NEUTROS PCT: 81.5 % — AB (ref 43.0–77.0)
PLATELETS: 226 10*3/uL (ref 150.0–400.0)
RBC: 5.07 Mil/uL (ref 3.87–5.11)
RDW: 27 % — AB (ref 11.5–15.5)
WBC: 7.1 10*3/uL (ref 4.0–10.5)

## 2016-08-19 MED ORDER — RANITIDINE HCL 150 MG PO TABS: 150.0000 mg | ORAL_TABLET | Freq: Two times a day (BID) | ORAL | 1 refills | Status: DC

## 2016-08-19 MED ORDER — FERROUS SULFATE 325 (65 FE) MG PO TABS: 325.0000 mg | ORAL_TABLET | Freq: Every day | ORAL | 3 refills | Status: DC

## 2016-08-19 NOTE — Progress Notes (Signed)
Subjective:    Patient ID: Brandy Armstrong, female    DOB: [birth year], 70 y.o.   MRN: [id number]  CC: Brandy Armstrong is a 70 y.o. female who presents today for follow up.   HPI: Patient here for follow-up on ED and hospitalization for one night,  6 days ago for low hemoglobin.  Per  ED labs, Folate, B12 normal. Iron stores mildly low. Iron saturation low. Patient was evaluated with EGD and capsule endoscopy therefore this was not done in the ED. Colonoscopy and EGD normal 2016. Negative occult blood in ED. Emergency room felt there was no evidence of acute bleeding, GI bleeding.  Feel much better today. Leg pain from one month ago resolved. SOB from one week ago also has resolved.      HISTORY:  Past Medical History:  Diagnosis Date  . Atrial fibrillation [facility])    a. s/p ablation 01/2012 in [facility] by Dr. Boyd Kerbs;  b. On sotalol & Xarelto;  c. 02/2014 Echo: EF 50-55%, mild conc LVH, nl LA size/structure;  d. Recurrent afib 8/15 & 08/29/2014.  . Barretts esophagus   . Chest pain    a. 02/2014 Myoview: Ef 50%, no ischemia.  . Colon polyps   . COPD (chronic obstructive pulmonary disease) ([place])   . COPD (chronic obstructive pulmonary disease) ([place])   . Depression with anxiety   . GERD (gastroesophageal reflux disease)   . Neuropathy ([place])   . Rectal fistula   . Rheumatoid arthritis ([place])    On methotrexate and orencia  . Urinary incontinence   . Vitamin D deficiency    Past Surgical History:  Procedure Laterality Date  . ABDOMINAL HYSTERECTOMY  1992  . CARDIAC ELECTROPHYSIOLOGY STUDY AND ABLATION  2013  . CHOLECYSTECTOMY  1987  . COLONOSCOPY N/A 05/08/2015   Procedure: COLONOSCOPY;  Surgeon: Manya Silvas, MD;  Location[facility] ENDOSCOPY;  Service: Endoscopy;  Laterality: N/A;  . ESOPHAGOGASTRODUODENOSCOPY N/A 05/06/2015   Procedure: ESOPHAGOGASTRODUODENOSCOPY (EGD);  Surgeon: Lollie Sails, MD;  Location: [facility] ENDOSCOPY;  Service: Endoscopy;  Laterality: N/A;  . oophrectomy  Bilateral 1992   Family History  Problem Relation Age of Onset  . Depression Mother   . Cancer Mother 54    pancreatic cancer  . Cancer Father 59    colon cancer  . Cancer Sister 30    breast cancer  . Breast cancer Sister 31  . Diabetes Brother   . Neuropathy Neg Hx     Allergies: Latex Current Outpatient Prescriptions on File Prior to Visit  Medication Sig Dispense Refill  . albuterol (PROVENTIL HFA;VENTOLIN HFA) 108 (90 Base) MCG/ACT inhaler Inhale into the lungs.    [name] amiodarone (PACERONE) 200 MG tablet Take 200 mg by mouth daily.    [name] apixaban (ELIQUIS) 5 MG TABS tablet Take 5 mg by mouth 2 (two) times daily.    . budesonide-formoterol (SYMBICORT) 160-4.5 MCG/ACT inhaler Inhale 2 puffs into the lungs 2 (two) times daily.    . clonazePAM (KLONOPIN) 0.5 MG tablet TAKE 1 TABLET BY MOUTH 3 TIMES DAILY AS NEEDED FOR ANXIETY 90 tablet 1  . cyclobenzaprine (FLEXERIL) 10 MG tablet Take 1 tablet (10 mg total) by mouth at bedtime. 30 tablet 1  . ELIQUIS 5 MG TABS tablet TAKE 1 TABLET (5 MG TOTAL) BY MOUTH TWO (2) TIMES A DAY.  6  . escitalopram (LEXAPRO) 10 MG tablet Take 1 tablet (10 mg total) by mouth daily. 90 tablet 1  . esomeprazole (NEXIUM) 40  MG capsule TAKE ONE CAPSULE BY MOUTH DAILY AT 12 NOON 30 capsule 11  . esomeprazole (NEXIUM) 40 MG capsule Take 40 mg by mouth daily at 12 noon.    . hydroxychloroquine (PLAQUENIL) 200 MG tablet Take 200 mg by mouth.    . leflunomide (ARAVA) 20 MG tablet Take 20 mg by mouth daily.    [name] lidocaine (XYLOCAINE) 5 % ointment APPLY 1 APPLICATION TOPICALLY AS NEEDED. 35.44 g 0  . lidocaine (XYLOCAINE) 5 % ointment Apply 1 application topically as needed.    . metoprolol succinate (TOPROL-XL) 25 MG 24 hr tablet Take 25 mg by mouth 2 (two) times daily.   6  . ondansetron (ZOFRAN) 4 MG tablet Take 1 tablet (4 mg total) by mouth every 6 (six) hours as needed for nausea. 20 tablet 0  . predniSONE (DELTASONE) 10 MG tablet Take 40 mg by mouth on day 1,  then taper 10 mg daily until gone 10 tablet 0  . pregabalin (LYRICA) 100 MG capsule Take 1 capsule (100 mg total) by mouth 3 (three) times daily as needed. 90 capsule 3  . SPIRIVA RESPIMAT 2.5 MCG/ACT AERS INHALE 2 PUFFS BY MOUTH DAILY.  6  . sulfamethoxazole-trimethoprim (BACTRIM DS,SEPTRA DS) 800-160 MG tablet Take 1 tablet by mouth 2 (two) times daily. (Patient not taking: Reported on 08/13/2016) 6 tablet 0  . SYMBICORT 160-4.5 MCG/ACT inhaler INHALE 2 PUFFS TWO (2) TIMES A DAY. 10.2 Inhaler 1  . Tiotropium Bromide Monohydrate (SPIRIVA RESPIMAT) 2.5 MCG/ACT AERS Inhale 2 puffs into the lungs daily.    [name] topiramate (TOPAMAX) 25 MG tablet Take 50 mg by mouth at bedtime.      No current facility-administered medications on file prior to visit.     Social History  Substance Use Topics  . Smoking status: Former Smoker    Packs/day: 1.00    Years: 40.00    Quit date: 12/16/2011  . Smokeless tobacco: Never Used  . Alcohol use 0.0 oz/week     Comment: Occasionally has a drink    Review of Systems  Constitutional: Negative for chills, fatigue and fever.  Respiratory: Negative for cough, shortness of breath and wheezing.   Cardiovascular: Negative for chest pain and palpitations.  Gastrointestinal: Negative for abdominal distention, nausea and vomiting.  Neurological: Negative for dizziness.      Objective:    BP 120/70 (BP Location: Left Arm, Patient Position: Sitting, Cuff Size: Large)   Pulse 90   Temp 98.4 F (36.9 C) (Oral)   Resp 16   Wt 205 lb (93 kg)   SpO2 96%   BMI 32.59 kg/m  BP Readings from Last 3 Encounters:  08/19/16 120/70  08/14/16 (!) 99/57  08/14/16 124/66   Wt Readings from Last 3 Encounters:  08/19/16 205 lb (93 kg)  08/13/16 208 lb (94.3 kg)  08/14/16 209 lb 3.2 oz (94.9 kg)    Physical Exam  Constitutional: She appears well-developed and well-nourished.  Eyes: Conjunctivae are normal.  Cardiovascular: Normal rate, regular rhythm, normal heart  sounds and normal pulses.   Pulmonary/Chest: Effort normal and breath sounds normal. She has no wheezes. She has no rhonchi. She has no rales.  Neurological: She is alert.  Skin: Skin is warm and dry.  Psychiatric: She has a normal mood and affect. Her speech is normal and behavior is normal. Thought content normal.  Vitals reviewed.      Assessment & Plan:   Problem List Items Addressed This Visit  Other   Anemia - Primary    Asymptomatic. Status post 2 units of blood. We'll start on iron supplement. Rechecking CBC today. Changed proton pump inhibitor and H2 blocker due to interference with iron absorption while patient is on iron.      Relevant Medications   ferrous sulfate 325 (65 FE) MG tablet   ranitidine (ZANTAC) 150 MG tablet   Other Relevant Orders   CBC with Differential/Platelet    Other Visit Diagnoses   None.      I am having Ms. Rennert start on ferrous sulfate and ranitidine. I am also having her maintain her leflunomide, metoprolol succinate, clonazePAM, escitalopram, amiodarone, esomeprazole, albuterol, pregabalin, lidocaine, SPIRIVA RESPIMAT, topiramate, ELIQUIS, hydroxychloroquine, SYMBICORT, sulfamethoxazole-trimethoprim, predniSONE, cyclobenzaprine, esomeprazole, lidocaine, budesonide-formoterol, apixaban, Tiotropium Bromide Monohydrate, and ondansetron.   Meds ordered this encounter  Medications  . ferrous sulfate 325 (65 FE) MG tablet    Sig: Take 1 tablet (325 mg total) by mouth daily with breakfast.    Dispense:  30 tablet    Refill:  3    Order Specific Question:   Supervising Provider    Answer:   Deborra Medina L [[id number]]  . ranitidine (ZANTAC) 150 MG tablet    Sig: Take 1 tablet (150 mg total) by mouth 2 (two) times daily.    Dispense:  60 tablet    Refill:  1    Order Specific Question:   Supervising Provider    Answer:   Crecencio Mc [[id number]]    Return precautions given.   Risks, benefits, and alternatives of the medications and  treatment plan prescribed today were discussed, and patient expressed understanding.   Education regarding symptom management and diagnosis given to patient on AVS.  Continue to follow with Mable Paris, FNP for routine health maintenance.   [name] and I agreed with plan.   Mable Paris, FNP

## 2016-08-19 NOTE — Assessment & Plan Note (Signed)
Asymptomatic. Status post 2 units of blood. We'll start on iron supplement. Rechecking CBC today. Changed proton pump inhibitor and H2 blocker due to interference with iron absorption while patient is on iron.

## 2016-08-19 NOTE — Patient Instructions (Signed)
So glad to see you today and happy to hear that things you are feeling better.  ENJOY Brandy Armstrong :)

## 2016-08-19 NOTE — Progress Notes (Deleted)
       Patient: Brandy Armstrong Female    DOB: 01/27/1946   70 y.o.   MRN: [id number] Visit Date: 08/19/2016  Today's Provider: Mable Paris, FNP   Chief Complaint  Patient presents with  . Follow-up   Subjective:    HPI  Follow up Hospitalization  Patient was [facility] and discharged on 08/14/16. She was treated for symptomatic anemia . Treatment for this included blood tranfusion. She reports good compliance with treatment. She reports this condition is {improved/worse/unchanged:[id number]}.  ------------------------------------------------------------------------------------      Allergies  Allergen Reactions  . Latex    No outpatient prescriptions have been marked as taking for the 08/19/16 encounter (Office Visit) with [name], FNP.    Review of Systems  Social History  Substance Use Topics  . Smoking status: Former Smoker    Packs/day: 1.00    Years: 40.00    Quit date: 12/16/2011  . Smokeless tobacco: Never Used  . Alcohol use 0.0 oz/week     Comment: Occasionally has a drink   Objective:   There were no vitals taken for this visit.  Physical Exam      Assessment & Plan:           Mable Paris, [facility]

## 2016-08-19 NOTE — Telephone Encounter (Signed)
She has appointment with [place] today.  It is a hospital FU.

## 2016-08-20 NOTE — Telephone Encounter (Signed)
Please call patient and let her know that I just received her EKG from emergency room visit, and I want to ensure that she is a follow-up appointment with [place] so we can repeat EKG. There are nonspecific changes that have likely resolved since transfusion. No atrial fib.     Also, I wanted to ensure that patient received x-ray results of hip and sine. X-rays did show mild atherosclerosis of the aortic iliac which is likely indicative of further atherosclerosis in the body.   Basically this means, although her lipid panels have been normal, she likely has cholesterol buildup. She has a low risk of having cardiac vascular outcomes however at her next visit I would like to discuss if she wants to start cholesterol medication. Based on current guidelines, this is not necessary however I want this to be a joint decision.    NOTE: framingham risk calculator 3.1% over 10 years; current guidelines suggest moderate risk; however LDL < 160, so not necessary for statin therapy.

## 2016-08-26 NOTE — Telephone Encounter (Signed)
Hi Suli!  Did you contact patient about this? I dont see a note.   I can also discuss in more full detail when I see her at f/u.  Thanks!

## 2016-08-27 NOTE — Telephone Encounter (Signed)
Patient was informed of results.  Patient understood and no questions, comments, or concerns at this time. Patient would like to discuss further questions At FU.

## 2016-09-23 ENCOUNTER — Telehealth: Payer: Self-pay | Admitting: Family

## 2016-09-23 DIAGNOSIS — Z0000 Encounter for general adult medical examination without abnormal findings: Secondary | ICD-10-CM

## 2016-09-23 NOTE — Telephone Encounter (Signed)
Which labs did you want to order?Please advise.

## 2016-09-23 NOTE — Telephone Encounter (Signed)
Pt change her lab appt to 09/24/16 from 10/01/16 need order please and thank you!

## 2016-09-23 NOTE — Telephone Encounter (Signed)
Can orders be placed has future for this patient?

## 2016-09-23 NOTE — Telephone Encounter (Signed)
Fasting CPE labs pended in system.   thanks

## 2016-09-24 ENCOUNTER — Encounter: Payer: Self-pay | Admitting: Family

## 2016-09-24 ENCOUNTER — Other Ambulatory Visit (INDEPENDENT_AMBULATORY_CARE_PROVIDER_SITE_OTHER): Payer: Medicare Other

## 2016-09-24 ENCOUNTER — Encounter: Payer: Self-pay | Admitting: Neurology

## 2016-09-24 ENCOUNTER — Ambulatory Visit (INDEPENDENT_AMBULATORY_CARE_PROVIDER_SITE_OTHER): Payer: Medicare Other | Admitting: Family

## 2016-09-24 VITALS — BP 116/84 | HR 92 | Temp 98.2°F | Wt 204.0 lb

## 2016-09-24 DIAGNOSIS — R9431 Abnormal electrocardiogram [ECG] [EKG]: Secondary | ICD-10-CM | POA: Diagnosis not present

## 2016-09-24 DIAGNOSIS — I2510 Atherosclerotic heart disease of native coronary artery without angina pectoris: Secondary | ICD-10-CM | POA: Diagnosis not present

## 2016-09-24 DIAGNOSIS — L0291 Cutaneous abscess, unspecified: Secondary | ICD-10-CM

## 2016-09-24 DIAGNOSIS — I480 Paroxysmal atrial fibrillation: Secondary | ICD-10-CM | POA: Diagnosis not present

## 2016-09-24 DIAGNOSIS — Z0000 Encounter for general adult medical examination without abnormal findings: Secondary | ICD-10-CM | POA: Diagnosis not present

## 2016-09-24 DIAGNOSIS — Z23 Encounter for immunization: Secondary | ICD-10-CM

## 2016-09-24 LAB — CBC WITH DIFFERENTIAL/PLATELET
Basophils Absolute: 0 10*3/uL (ref 0.0–0.1)
Basophils Relative: 0.7 % (ref 0.0–3.0)
Eosinophils Absolute: 0.1 10*3/uL (ref 0.0–0.7)
Eosinophils Relative: 1.1 % (ref 0.0–5.0)
HCT: 35.4 % — ABNORMAL LOW (ref 36.0–46.0)
Hemoglobin: 11.2 g/dL — ABNORMAL LOW (ref 12.0–15.0)
Lymphocytes Relative: 23.4 % (ref 12.0–46.0)
Lymphs Abs: 1.7 10*3/uL (ref 0.7–4.0)
MCHC: 31.6 g/dL (ref 30.0–36.0)
MCV: 69.9 fl — ABNORMAL LOW (ref 78.0–100.0)
Monocytes Absolute: 0.7 10*3/uL (ref 0.1–1.0)
Monocytes Relative: 9.5 % (ref 3.0–12.0)
Neutro Abs: 4.6 10*3/uL (ref 1.4–7.7)
Neutrophils Relative %: 65.3 % (ref 43.0–77.0)
Platelets: 219 10*3/uL (ref 150.0–400.0)
RBC: 5.06 Mil/uL (ref 3.87–5.11)
RDW: 28.4 % — ABNORMAL HIGH (ref 11.5–15.5)
WBC: 7.1 10*3/uL (ref 4.0–10.5)

## 2016-09-24 LAB — COMPREHENSIVE METABOLIC PANEL
ALK PHOS: 110 U/L (ref 39–117)
ALT: 9 U/L (ref 0–35)
AST: 19 U/L (ref 0–37)
Albumin: 3.4 g/dL — ABNORMAL LOW (ref 3.5–5.2)
BILIRUBIN TOTAL: 0.5 mg/dL (ref 0.2–1.2)
BUN: 14 mg/dL (ref 6–23)
CO2: 27 mEq/L (ref 19–32)
Calcium: 8.8 mg/dL (ref 8.4–10.5)
Chloride: 107 mEq/L (ref 96–112)
Creatinine, Ser: 0.9 mg/dL (ref 0.40–1.20)
GFR: 66.15 mL/min (ref >60.00)
GLUCOSE: 82 mg/dL (ref 70–99)
Potassium: 4.5 mEq/L (ref 3.5–5.1)
SODIUM: 140 meq/L (ref 135–145)
TOTAL PROTEIN: 6.8 g/dL (ref 6.0–8.3)

## 2016-09-24 LAB — LIPID PANEL
Cholesterol: 147 mg/dL (ref 0–200)
HDL: 32 mg/dL — ABNORMAL LOW
LDL Cholesterol: 84 mg/dL (ref 0–99)
NonHDL: 114.7
Total CHOL/HDL Ratio: 5
Triglycerides: 153 mg/dL — ABNORMAL HIGH (ref 0.0–149.0)
VLDL: 30.6 mg/dL (ref 0.0–40.0)

## 2016-09-24 LAB — TSH: TSH: 2.48 u[IU]/mL (ref 0.35–4.50)

## 2016-09-24 LAB — VITAMIN D 25 HYDROXY (VIT D DEFICIENCY, FRACTURES): VITD: 28.1 ng/mL — ABNORMAL LOW (ref 30.00–100.00)

## 2016-09-24 LAB — HEMOGLOBIN A1C: Hgb A1c MFr Bld: 5.4 % (ref 4.6–6.5)

## 2016-09-24 MED ORDER — DOXYCYCLINE HYCLATE 100 MG PO TABS: 100.0000 mg | ORAL_TABLET | Freq: Two times a day (BID) | ORAL | 0 refills | Status: DC

## 2016-09-24 MED ORDER — ROSUVASTATIN CALCIUM 40 MG PO TABS: 40.0000 mg | ORAL_TABLET | Freq: Every day | ORAL | 3 refills | Status: DC

## 2016-09-24 MED ORDER — MUPIROCIN 2 % EX OINT: 1.0000 "application " | TOPICAL_OINTMENT | Freq: Two times a day (BID) | CUTANEOUS | 1 refills | Status: DC

## 2016-09-24 NOTE — Progress Notes (Signed)
Pre visit review using our clinic review tool, if applicable. No additional management support is needed unless otherwise documented below in the visit note. 

## 2016-09-24 NOTE — Assessment & Plan Note (Addendum)
Multiple, discrete abscesses predominantly over the suprapubic region. No drainage. Non fluctuant. Considering foliculitis. No history of MRSA. Treating with topical antibiotic cream and oral doxycycline. Return precautions given. Warm compresses.

## 2016-09-24 NOTE — Patient Instructions (Signed)
Warm ompresses. You may use topical antibiotic as soon as you feel a new boil coming on. Please let me know if  not resolved.   Abscess An abscess is an infected area that contains a collection of pus and debris.It can occur in almost any part of the body. An abscess is also known as a furuncle or boil. CAUSES  An abscess occurs when tissue gets infected. This can occur from blockage of oil or sweat glands, infection of hair follicles, or a minor injury to the skin. As the body tries to fight the infection, pus collects in the area and creates pressure under the skin. This pressure causes pain. People with weakened immune systems have difficulty fighting infections and get certain abscesses more often.  SYMPTOMS Usually an abscess develops on the skin and becomes a painful mass that is red, warm, and tender. If the abscess forms under the skin, you may feel a moveable soft area under the skin. Some abscesses break open (rupture) on their own, but most will continue to get worse without care. The infection can spread deeper into the body and eventually into the bloodstream, causing you to feel ill.  DIAGNOSIS  Your caregiver will take your medical history and perform a physical exam. A sample of fluid may also be taken from the abscess to determine what is causing your infection. TREATMENT  Your caregiver may prescribe antibiotic medicines to fight the infection. However, taking antibiotics alone usually does not cure an abscess. Your caregiver may need to make a small cut (incision) in the abscess to drain the pus. In some cases, gauze is packed into the abscess to reduce pain and to continue draining the area. HOME CARE INSTRUCTIONS   Only take over-the-counter or prescription medicines for pain, discomfort, or fever as directed by your caregiver.  If you were prescribed antibiotics, take them as directed. Finish them even if you start to feel better.  If gauze is used, follow your caregiver's  directions for changing the gauze.  To avoid spreading the infection:  Keep your draining abscess covered with a bandage.  Wash your hands well.  Do not share personal care items, towels, or whirlpools with others.  Avoid skin contact with others.  Keep your skin and clothes clean around the abscess.  Keep all follow-up appointments as directed by your caregiver. SEEK MEDICAL CARE IF:   You have increased pain, swelling, redness, fluid drainage, or bleeding.  You have muscle aches, chills, or a general ill feeling.  You have a fever. MAKE SURE YOU:   Understand these instructions.  Will watch your condition.  Will get help right away if you are not doing well or get worse.   This information is not intended to replace advice given to you by your health care provider. Make sure you discuss any questions you have with your health care provider.   Document Released: 09/25/2005 Document Revised: 06/16/2012 Document Reviewed: 02/28/2012 Elsevier Interactive Patient Education [employer].

## 2016-09-24 NOTE — Assessment & Plan Note (Signed)
EKG Afib. HR 83. No ischemia. Converted back into Afib. Patient is asymptomatic and I'm reassured that she is rate controlled at this time. Patient is currently on Eliquis and amiodarone. We jointly decided that she would call her cardiologist today to make him aware that she's converted back to atrial fibrillation. She follows with Dr. Jannifer Franklin.

## 2016-09-24 NOTE — Progress Notes (Signed)
Subjective:    Patient ID: Brandy Armstrong, female    DOB: Feb 27, 1946, 70 y.o.   MRN: [id number]  CC: Brandy Armstrong is a 69 y.o. female who presents today for an acute visit.    HPI: Patient here for acute visit with chief complaint of boils, over past few weeks. Worsening. She states some of them have been draining others do not draining yet. Has tried to squeeze one which pus. No fever, chills. Largest cyst suprapubic. H/o of boils as young child. No h/o MRSA.    Discussed repeating EKG from hospitalization which showed abnormal EKG.   Discussed statin therapy. 8/15 CXR lumbar XR Aortoiliac atherosclerotic vascular disease.     HISTORY:  Past Medical History:  Diagnosis Date  . Atrial fibrillation [facility])    a. s/p ablation 01/2012 in [facility] by Dr. Boyd Kerbs;  b. On sotalol & Xarelto;  c. 02/2014 Echo: EF 50-55%, mild conc LVH, nl LA size/structure;  d. Recurrent afib 8/15 & 08/29/2014.  . Barretts esophagus   . Chest pain    a. 02/2014 Myoview: Ef 50%, no ischemia.  . Colon polyps   . COPD (chronic obstructive pulmonary disease) ([place])   . COPD (chronic obstructive pulmonary disease) ([place])   . Depression with anxiety   . GERD (gastroesophageal reflux disease)   . Neuropathy ([place])   . Rectal fistula   . Rheumatoid arthritis ([place])    On methotrexate and orencia  . Urinary incontinence   . Vitamin D deficiency    Past Surgical History:  Procedure Laterality Date  . ABDOMINAL HYSTERECTOMY  1992  . CARDIAC ELECTROPHYSIOLOGY STUDY AND ABLATION  2013  . CHOLECYSTECTOMY  1987  . COLONOSCOPY N/A 05/08/2015   Procedure: COLONOSCOPY;  Surgeon: Manya Silvas, MD;  Location: [facility] ENDOSCOPY;  Service: Endoscopy;  Laterality: N/A;  . ESOPHAGOGASTRODUODENOSCOPY N/A 05/06/2015   Procedure: ESOPHAGOGASTRODUODENOSCOPY (EGD);  Surgeon: Lollie Sails, MD;  Location: [facility] ENDOSCOPY;  Service: Endoscopy;  Laterality: N/A;  . oophrectomy Bilateral 1992   Family History  Problem Relation Age  of Onset  . Depression Mother   . Cancer Mother 70    pancreatic cancer  . Cancer Father 47    colon cancer  . Cancer Sister 30    breast cancer  . Breast cancer Sister 28  . Diabetes Brother   . Neuropathy Neg Hx     Allergies: Latex Current Outpatient Prescriptions on File Prior to Visit  Medication Sig Dispense Refill  . albuterol (PROVENTIL HFA;VENTOLIN HFA) 108 (90 Base) MCG/ACT inhaler Inhale into the lungs.    [name] amiodarone (PACERONE) 200 MG tablet Take 200 mg by mouth daily.    [name] apixaban (ELIQUIS) 5 MG TABS tablet Take 5 mg by mouth 2 (two) times daily.    . budesonide-formoterol (SYMBICORT) 160-4.5 MCG/ACT inhaler Inhale 2 puffs into the lungs 2 (two) times daily.    . clonazePAM (KLONOPIN) 0.5 MG tablet TAKE 1 TABLET BY MOUTH 3 TIMES DAILY AS NEEDED FOR ANXIETY 90 tablet 1  . cyclobenzaprine (FLEXERIL) 10 MG tablet Take 1 tablet (10 mg total) by mouth at bedtime. 30 tablet 1  . ELIQUIS 5 MG TABS tablet TAKE 1 TABLET (5 MG TOTAL) BY MOUTH TWO (2) TIMES A DAY.  6  . escitalopram (LEXAPRO) 10 MG tablet Take 1 tablet (10 mg total) by mouth daily. 90 tablet 1  . esomeprazole (NEXIUM) 40 MG capsule TAKE ONE CAPSULE BY MOUTH DAILY AT 12 NOON 30 capsule 11  .  esomeprazole (NEXIUM) 40 MG capsule Take 40 mg by mouth daily at 12 noon.    . ferrous sulfate 325 (65 FE) MG tablet Take 1 tablet (325 mg total) by mouth daily with breakfast. 30 tablet 3  . hydroxychloroquine (PLAQUENIL) 200 MG tablet Take 200 mg by mouth.    . leflunomide (ARAVA) 20 MG tablet Take 20 mg by mouth daily.    [name] lidocaine (XYLOCAINE) 5 % ointment APPLY 1 APPLICATION TOPICALLY AS NEEDED. 35.44 g 0  . lidocaine (XYLOCAINE) 5 % ointment Apply 1 application topically as needed.    . metoprolol succinate (TOPROL-XL) 25 MG 24 hr tablet Take 25 mg by mouth 2 (two) times daily.   6  . ondansetron (ZOFRAN) 4 MG tablet Take 1 tablet (4 mg total) by mouth every 6 (six) hours as needed for nausea. 20 tablet 0  . predniSONE  (DELTASONE) 10 MG tablet Take 40 mg by mouth on day 1, then taper 10 mg daily until gone 10 tablet 0  . pregabalin (LYRICA) 100 MG capsule Take 1 capsule (100 mg total) by mouth 3 (three) times daily as needed. 90 capsule 3  . ranitidine (ZANTAC) 150 MG tablet Take 1 tablet (150 mg total) by mouth 2 (two) times daily. 60 tablet 1  . SPIRIVA RESPIMAT 2.5 MCG/ACT AERS INHALE 2 PUFFS BY MOUTH DAILY.  6  . sulfamethoxazole-trimethoprim (BACTRIM DS,SEPTRA DS) 800-160 MG tablet Take 1 tablet by mouth 2 (two) times daily. (Patient not taking: Reported on 08/13/2016) 6 tablet 0  . SYMBICORT 160-4.5 MCG/ACT inhaler INHALE 2 PUFFS TWO (2) TIMES A DAY. 10.2 Inhaler 1  . Tiotropium Bromide Monohydrate (SPIRIVA RESPIMAT) 2.5 MCG/ACT AERS Inhale 2 puffs into the lungs daily.    [name] topiramate (TOPAMAX) 25 MG tablet Take 50 mg by mouth at bedtime.      No current facility-administered medications on file prior to visit.     Social History  Substance Use Topics  . Smoking status: Former Smoker    Packs/day: 1.00    Years: 40.00    Quit date: 12/16/2011  . Smokeless tobacco: Never Used  . Alcohol use 0.0 oz/week     Comment: Occasionally has a drink    Review of Systems  Constitutional: Negative for chills and fever.  Respiratory: Negative for cough and shortness of breath.   Cardiovascular: Negative for chest pain and palpitations.  Gastrointestinal: Negative for nausea and vomiting.  Skin: Positive for wound. Negative for rash.  Neurological: Negative for dizziness.      Objective:    BP 116/84 (BP Location: Left Arm, Patient Position: Sitting, Cuff Size: Large)   Pulse 92   Temp 98.2 F (36.8 C) (Oral)   Wt 204 lb (92.5 kg)   SpO2 96%   BMI 32.43 kg/m    Physical Exam  Constitutional: She appears well-developed and well-nourished.  Eyes: Conjunctivae are normal.  Cardiovascular: Normal rate, normal heart sounds and normal pulses.  A regularly irregular rhythm present.  Pulmonary/Chest:  Effort normal and breath sounds normal. She has no wheezes. She has no rhonchi. She has no rales.  Neurological: She is alert.  Skin: Skin is warm and dry.  Multiple, discrete abscesses predominantly over the suprapubic region. No drainage. Non fluctuant. Largest approximately 2 cm in diameter which is tender with palpation. Surrounding skin intact. No increased warmth.  Psychiatric: She has a normal mood and affect. Her speech is normal and behavior is normal. Thought content normal.  Vitals reviewed.  Assessment & Plan:   Problem List Items Addressed This Visit      Cardiovascular and Mediastinum   Atrial fibrillation (HCC)    EKG Afib. HR 83. No ischemia. Converted back into Afib. Patient is asymptomatic and I'm reassured that she is rate controlled at this time. Patient is currently on Eliquis and amiodarone. We jointly decided that she would call her cardiologist today to make him aware that she's converted back to atrial fibrillation. She follows with Dr. Jannifer Franklin.      Relevant Medications   rosuvastatin (CRESTOR) 40 MG tablet     Other   Abscess - Primary    Multiple, discrete abscesses predominantly over the suprapubic region. Considering for foliculitis. No history of MRSA. Treating with topical antibiotic cream and oral doxycycline. Return precautions given. Warm compresses.       Relevant Medications   doxycycline (VIBRA-TABS) 100 MG tablet   mupirocin ointment (BACTROBAN) 2 %    Other Visit Diagnoses    Atherosclerosis of native coronary artery of native heart without angina pectoris       Relevant Medications   rosuvastatin (CRESTOR) 40 MG tablet   Nonspecific abnormal electrocardiogram (ECG) (EKG)       Relevant Orders   EKG 12-Lead (Completed)         I am having Ms. Swanton maintain her leflunomide, metoprolol succinate, clonazePAM, escitalopram, amiodarone, esomeprazole, albuterol, pregabalin, lidocaine, SPIRIVA RESPIMAT, topiramate, ELIQUIS,  hydroxychloroquine, SYMBICORT, sulfamethoxazole-trimethoprim, predniSONE, cyclobenzaprine, esomeprazole, lidocaine, budesonide-formoterol, apixaban, Tiotropium Bromide Monohydrate, ondansetron, ferrous sulfate, and ranitidine.   No orders of the defined types were placed in this encounter.   Return precautions given.   Risks, benefits, and alternatives of the medications and treatment plan prescribed today were discussed, and patient expressed understanding.   Education regarding symptom management and diagnosis given to patient on AVS.  Continue to follow with Mable Paris, FNP for routine health maintenance.   [name] and I agreed with plan.   Mable Paris, FNP

## 2016-09-24 NOTE — Assessment & Plan Note (Signed)
Seen on lumbar x-ray 07/2016. We'll start patient on moderate- high intensity statin.

## 2016-09-26 ENCOUNTER — Ambulatory Visit (INDEPENDENT_AMBULATORY_CARE_PROVIDER_SITE_OTHER): Payer: Medicare Other | Admitting: Family Medicine

## 2016-09-26 ENCOUNTER — Encounter: Payer: Self-pay | Admitting: Family Medicine

## 2016-09-26 DIAGNOSIS — R0602 Shortness of breath: Secondary | ICD-10-CM | POA: Diagnosis not present

## 2016-09-26 DIAGNOSIS — K219 Gastro-esophageal reflux disease without esophagitis: Secondary | ICD-10-CM | POA: Diagnosis not present

## 2016-09-26 DIAGNOSIS — I481 Persistent atrial fibrillation: Secondary | ICD-10-CM | POA: Diagnosis not present

## 2016-09-26 DIAGNOSIS — L0390 Cellulitis, unspecified: Secondary | ICD-10-CM | POA: Insufficient documentation

## 2016-09-26 DIAGNOSIS — J449 Chronic obstructive pulmonary disease, unspecified: Secondary | ICD-10-CM | POA: Diagnosis not present

## 2016-09-26 DIAGNOSIS — Z7901 Long term (current) use of anticoagulants: Secondary | ICD-10-CM | POA: Diagnosis not present

## 2016-09-26 DIAGNOSIS — L03114 Cellulitis of left upper limb: Secondary | ICD-10-CM

## 2016-09-26 DIAGNOSIS — Z87891 Personal history of nicotine dependence: Secondary | ICD-10-CM | POA: Diagnosis not present

## 2016-09-26 DIAGNOSIS — M069 Rheumatoid arthritis, unspecified: Secondary | ICD-10-CM | POA: Diagnosis not present

## 2016-09-26 DIAGNOSIS — L0291 Cutaneous abscess, unspecified: Secondary | ICD-10-CM | POA: Diagnosis not present

## 2016-09-26 DIAGNOSIS — M1990 Unspecified osteoarthritis, unspecified site: Secondary | ICD-10-CM | POA: Diagnosis not present

## 2016-09-26 DIAGNOSIS — Z7951 Long term (current) use of inhaled steroids: Secondary | ICD-10-CM | POA: Diagnosis not present

## 2016-09-26 DIAGNOSIS — Z9049 Acquired absence of other specified parts of digestive tract: Secondary | ICD-10-CM | POA: Diagnosis not present

## 2016-09-26 DIAGNOSIS — Z79899 Other long term (current) drug therapy: Secondary | ICD-10-CM | POA: Diagnosis not present

## 2016-09-26 DIAGNOSIS — R5383 Other fatigue: Secondary | ICD-10-CM | POA: Diagnosis not present

## 2016-09-26 MED ORDER — CEPHALEXIN 500 MG PO CAPS: 500.0000 mg | ORAL_CAPSULE | Freq: Two times a day (BID) | ORAL | 0 refills | Status: DC

## 2016-09-26 NOTE — Assessment & Plan Note (Signed)
Already being treated for this with doxycycline. She'll continue this medication. She'll continue to monitor. Given return precautions.

## 2016-09-26 NOTE — Progress Notes (Signed)
  Tommi Rumps, MD Phone: [phone]  Brandy Armstrong is a 70 y.o. female who presents today for same-day visit.  Cellulitis: Patient received a flu shot 2 days ago. Notes she started to have erythema over her biceps yesterday. Has started to spread. Feels warm and is tender. No fevers. Does not feel sick otherwise. She's currently on doxycycline for several boils in her suprapubic region.   ROS see history of present illness  Objective  Physical Exam Vitals:   09/26/16 1540  BP: 110/70  Pulse: (!) 101  Temp: 98.6 F (37 C)    BP Readings from Last 3 Encounters:  09/26/16 110/70  09/24/16 116/84  08/19/16 120/70   Wt Readings from Last 3 Encounters:  09/24/16 204 lb (92.5 kg)  08/19/16 205 lb (93 kg)  08/13/16 208 lb (94.3 kg)    Physical Exam  Constitutional: She is well-developed, well-nourished, and in no distress.  Cardiovascular: Normal rate.  An irregularly irregular rhythm present.  2+ radial pulse on the left  Pulmonary/Chest: Effort normal and breath sounds normal. No respiratory distress. She has no wheezes.  Musculoskeletal: She exhibits no edema.  Neurological: She is alert.  Skin: Skin is warm and dry.        Assessment/Plan: Please see individual problem list.  Cellulitis Area in left upper extremity consistent with cellulitis. She is already on doxycycline. We will add Keflex to provide better streptococcal coverage. She'll monitor the area. She'll follow-up on Monday for this. She's given return precautions.  Abscess Already being treated for this with doxycycline. She'll continue this medication. She'll continue to monitor. Given return precautions.   No orders of the defined types were placed in this encounter.   Meds ordered this encounter  Medications  . cephALEXin (KEFLEX) 500 MG capsule    Sig: Take 1 capsule (500 mg total) by mouth 2 (two) times daily.    Dispense:  14 capsule    Refill:  0   Tommi Rumps, MD [place]

## 2016-09-26 NOTE — Patient Instructions (Signed)
Nice to meet you. We are going to start you on Keflex in addition to the doxycycline to cover for cellulitis in your arm. You should continue the doxycycline as previously prescribed. If you develop fevers, spreading redness, nausea, vomiting, or any new or changing symptoms please seek medical attention immediately.

## 2016-09-26 NOTE — Assessment & Plan Note (Signed)
Area in left upper extremity consistent with cellulitis. She is already on doxycycline. We will add Keflex to provide better streptococcal coverage. She'll monitor the area. She'll follow-up on Monday for this. She's given return precautions.

## 2016-09-27 DIAGNOSIS — I440 Atrioventricular block, first degree: Secondary | ICD-10-CM | POA: Diagnosis not present

## 2016-09-27 DIAGNOSIS — Z87891 Personal history of nicotine dependence: Secondary | ICD-10-CM | POA: Diagnosis not present

## 2016-09-27 DIAGNOSIS — Z7901 Long term (current) use of anticoagulants: Secondary | ICD-10-CM | POA: Diagnosis not present

## 2016-09-27 DIAGNOSIS — Z79899 Other long term (current) drug therapy: Secondary | ICD-10-CM | POA: Diagnosis not present

## 2016-09-27 DIAGNOSIS — I481 Persistent atrial fibrillation: Secondary | ICD-10-CM | POA: Diagnosis not present

## 2016-09-27 DIAGNOSIS — R9431 Abnormal electrocardiogram [ECG] [EKG]: Secondary | ICD-10-CM | POA: Diagnosis not present

## 2016-09-27 DIAGNOSIS — I4891 Unspecified atrial fibrillation: Secondary | ICD-10-CM | POA: Diagnosis not present

## 2016-09-30 ENCOUNTER — Ambulatory Visit (INDEPENDENT_AMBULATORY_CARE_PROVIDER_SITE_OTHER): Payer: Medicare Other | Admitting: Family Medicine

## 2016-09-30 ENCOUNTER — Encounter: Payer: Self-pay | Admitting: Family Medicine

## 2016-09-30 DIAGNOSIS — R21 Rash and other nonspecific skin eruption: Secondary | ICD-10-CM | POA: Insufficient documentation

## 2016-09-30 DIAGNOSIS — L03114 Cellulitis of left upper limb: Secondary | ICD-10-CM | POA: Diagnosis not present

## 2016-09-30 DIAGNOSIS — M792 Neuralgia and neuritis, unspecified: Secondary | ICD-10-CM | POA: Diagnosis not present

## 2016-09-30 NOTE — Progress Notes (Signed)
  Tommi Rumps, MD Phone: [phone]  Brandy Armstrong is a 70 y.o. female who presents today for follow-up.  Patient seen last week for cellulitis of the left upper extremity following a flu shot. She was started on Keflex. She had already been on doxycycline for several boils. She notes the redness in her left bicep has improved. No pain in this area. Notes the boils dried up as well. No fevers. Feels well.   She additionally notes that she had a cardioversion last week for A. fib. Notes that she had skin reaction to the pads. That has resolved at this time. It was over the anterior portion of her central chest. No palpitations.  Additionally notes over the last several days having an occasional shooting sharp electric-like shock pain down the inside aspect of her left arm into the ulnar aspect of her left hand. It starts in her left axilla. No neck pain with this. No numbness or weakness. Notes it felt as though she had something pop in her left axilla prior to this occurring the first time.  PMH: Former smoker.   ROS see history of present illness  Objective  Physical Exam Vitals:   09/30/16 1029 09/30/16 1207  BP: (!) 150/88 (!) 142/80  Pulse: 71   Temp: 98.4 F (36.9 C)     BP Readings from Last 3 Encounters:  09/30/16 (!) 142/80  09/26/16 110/70  09/24/16 116/84   Wt Readings from Last 3 Encounters:  09/30/16 206 lb 6 oz (93.6 kg)  09/24/16 204 lb (92.5 kg)  08/19/16 205 lb (93 kg)    Physical Exam  Constitutional: She is well-developed, well-nourished, and in no distress.  Cardiovascular: Normal rate, regular rhythm and normal heart sounds.   Pulmonary/Chest: Effort normal and breath sounds normal.  Musculoskeletal: She exhibits no edema.  No midline spine tenderness, no midline spine step-off, no tenderness in the left axilla, no tenderness in the left arm, no erythema over the bicep, no warmth over the bicep, no induration over the bicep, negative Tinel's at the  left ulnar groove  Neurological: She is alert. Gait normal.  5 out of 5 strength bilateral biceps, triceps, and grip, sensation to light touch intact in bilateral upper extremities  Skin: Skin is warm and dry.  No rash noted on upper chest     Assessment/Plan: Please see individual problem list.  Cellulitis Appears significantly improved. No signs of cellulitis on exam today. She'll finish the antibiotics. Given return precautions.  Rash and nonspecific skin eruption Patient with rash following cardioversion. Suspect contact dermatitis related to the pads used. No rash evident at this time. She'll monitor for recurrence.  Radicular pain in left arm Patient's symptoms in her left upper extremity most consistent with radicular pain. No neck pain to indicate the neck as a source. Could be nerve impingement from neck anywhere down to her left upper extremity. Discussed options for management including physical therapy, exercises at home for her shoulder, or medication such as gabapentin. She is already on Lyrica which should provide some benefit. Patient opted for home exercises for her shoulder to see if this is beneficial. If no improvement would consider physical therapy or referral to neurology. Given return precautions.   Tommi Rumps, MD [place]

## 2016-09-30 NOTE — Assessment & Plan Note (Signed)
Patient's symptoms in her left upper extremity most consistent with radicular pain. No neck pain to indicate the neck as a source. Could be nerve impingement from neck anywhere down to her left upper extremity. Discussed options for management including physical therapy, exercises at home for her shoulder, or medication such as gabapentin. She is already on Lyrica which should provide some benefit. Patient opted for home exercises for her shoulder to see if this is beneficial. If no improvement would consider physical therapy or referral to neurology. Given return precautions.

## 2016-09-30 NOTE — Assessment & Plan Note (Signed)
Patient with rash following cardioversion. Suspect contact dermatitis related to the pads used. No rash evident at this time. She'll monitor for recurrence.

## 2016-09-30 NOTE — Patient Instructions (Signed)
Nice to see you. Your cellulitis appears improved. You should continue the antibiotics until they're gone. Please do the following exercises for your likely pinched nerve. If you develop numbness or weakness or increased pain, fevers, swelling, or any new or changing symptoms please seek medical attention.    Generic Shoulder Exercises EXERCISES  RANGE OF MOTION (ROM) AND STRETCHING EXERCISES These exercises may help you when beginning to rehabilitate your injury. Your symptoms may resolve with or without further involvement from your physician, physical therapist or athletic trainer. While completing these exercises, remember:   Restoring tissue flexibility helps normal motion to return to the joints. This allows healthier, less painful movement and activity.  An effective stretch should be held for at least 30 seconds.  A stretch should never be painful. You should only feel a gentle lengthening or release in the stretched tissue. ROM - Pendulum  Bend at the waist so that your right / left arm falls away from your body. Support yourself with your opposite hand on a solid surface, such as a table or a countertop.  Your right / left arm should be perpendicular to the ground. If it is not perpendicular, you need to lean over farther. Relax the muscles in your right / left arm and shoulder as much as possible.  Gently sway your hips and trunk so they move your right / left arm without any use of your right / left shoulder muscles.  Progress your movements so that your right / left arm moves side to side, then forward and backward, and finally, both clockwise and counterclockwise.  Complete __________ repetitions in each direction. Many people use this exercise to relieve discomfort in their shoulder as well as to gain range of motion. Repeat __________ times. Complete this exercise __________ times per day. STRETCH - Flexion, Standing  Stand with good posture. With an underhand grip on  your right / left hand and an overhand grip on the opposite hand, grasp a broomstick or cane so that your hands are a little more than shoulder-width apart.  Keeping your right / left elbow straight and shoulder muscles relaxed, push the stick with your opposite hand to raise your right / left arm in front of your body and then overhead. Raise your arm until you feel a stretch in your right / left shoulder, but before you have increased shoulder pain.  Try to avoid shrugging your right / left shoulder as your arm rises by keeping your shoulder blade tucked down and toward your mid-back spine. Hold __________ seconds.  Slowly return to the starting position. Repeat __________ times. Complete this exercise __________ times per day. STRETCH - Internal Rotation  Place your right / left hand behind your back, palm-up.  Throw a towel or belt over your opposite shoulder. Grasp the towel/belt with your right / left hand.  While keeping an upright posture, gently pull up on the towel/belt until you feel a stretch in the front of your right / left shoulder.  Avoid shrugging your right / left shoulder as your arm rises by keeping your shoulder blade tucked down and toward your mid-back spine.  Hold __________. Release the stretch by lowering your opposite hand. Repeat __________ times. Complete this exercise __________ times per day. STRETCH - External Rotation and Abduction  Stagger your stance through a doorframe. It does not matter which foot is forward.  As instructed by your physician, physical therapist or athletic trainer, place your hands:  And forearms above your head  and on the door frame.  And forearms at head-height and on the door frame.  At elbow-height and on the door frame.  Keeping your head and chest upright and your stomach muscles tight to prevent over-extending your low-back, slowly shift your weight onto your front foot until you feel a stretch across your chest and/or in  the front of your shoulders.  Hold __________ seconds. Shift your weight to your back foot to release the stretch. Repeat __________ times. Complete this stretch __________ times per day.  STRENGTHENING EXERCISES  These exercises may help you when beginning to rehabilitate your injury. They may resolve your symptoms with or without further involvement from your physician, physical therapist or athletic trainer. While completing these exercises, remember:   Muscles can gain both the endurance and the strength needed for everyday activities through controlled exercises.  Complete these exercises as instructed by your physician, physical therapist or athletic trainer. Progress the resistance and repetitions only as guided.  You may experience muscle soreness or fatigue, but the pain or discomfort you are trying to eliminate should never worsen during these exercises. If this pain does worsen, stop and make certain you are following the directions exactly. If the pain is still present after adjustments, discontinue the exercise until you can discuss the trouble with your clinician.  If advised by your physician, during your recovery, avoid activity or exercises which involve actions that place your right / left hand or elbow above your head or behind your back or head. These positions stress the tissues which are trying to heal. STRENGTH - Scapular Depression and Adduction  With good posture, sit on a firm chair. Supported your arms in front of you with pillows, arm rests or a table top. Have your elbows in line with the sides of your body.  Gently draw your shoulder blades down and toward your mid-back spine. Gradually increase the tension without tensing the muscles along the top of your shoulders and the back of your neck.  Hold for __________ seconds. Slowly release the tension and relax your muscles completely before completing the next repetition.  After you have practiced this exercise,  remove the arm support and complete it in standing as well as sitting. Repeat __________ times. Complete this exercise __________ times per day.  STRENGTH - External Rotators  Secure a rubber exercise band/tubing to a fixed object so that it is at the same height as your right / left elbow when you are standing or sitting on a firm surface.  Stand or sit so that the secured exercise band/tubing is at your side that is not injured.  Bend your elbow 90 degrees. Place a folded towel or small pillow under your right / left arm so that your elbow is a few inches away from your side.  Keeping the tension on the exercise band/tubing, pull it away from your body, as if pivoting on your elbow. Be sure to keep your body steady so that the movement is only coming from your shoulder rotating.  Hold __________ seconds. Release the tension in a controlled manner as you return to the starting position. Repeat __________ times. Complete this exercise __________ times per day.  STRENGTH - Supraspinatus  Stand or sit with good posture. Grasp a __________ weight or an exercise band/tubing so that your hand is "thumbs-up," like when you shake hands.  Slowly lift your right / left hand from your thigh into the air, traveling about 30 degrees from straight out at your  side. Lift your hand to shoulder height or as far as you can without increasing any shoulder pain. Initially, many people do not lift their hands above shoulder height.  Avoid shrugging your right / left shoulder as your arm rises by keeping your shoulder blade tucked down and toward your mid-back spine.  Hold for __________ seconds. Control the descent of your hand as you slowly return to your starting position. Repeat __________ times. Complete this exercise __________ times per day.  STRENGTH - Shoulder Extensors  Secure a rubber exercise band/tubing so that it is at the height of your shoulders when you are either standing or sitting on a firm  arm-less chair.  With a thumbs-up grip, grasp an end of the band/tubing in each hand. Straighten your elbows and lift your hands straight in front of you at shoulder height. Step back away from the secured end of band/tubing until it becomes tense.  Squeezing your shoulder blades together, pull your hands down to the sides of your thighs. Do not allow your hands to go behind you.  Hold for __________ seconds. Slowly ease the tension on the band/tubing as you reverse the directions and return to the starting position. Repeat __________ times. Complete this exercise __________ times per day.  STRENGTH - Scapular Retractors  Secure a rubber exercise band/tubing so that it is at the height of your shoulders when you are either standing or sitting on a firm arm-less chair.  With a palm-down grip, grasp an end of the band/tubing in each hand. Straighten your elbows and lift your hands straight in front of you at shoulder height. Step back away from the secured end of band/tubing until it becomes tense.  Squeezing your shoulder blades together, draw your elbows back as you bend them. Keep your upper arm lifted away from your body throughout the exercise.  Hold __________ seconds. Slowly ease the tension on the band/tubing as you reverse the directions and return to the starting position. Repeat __________ times. Complete this exercise __________ times per day. STRENGTH - Scapular Depressors  Find a sturdy chair without wheels, such as a from a dining room table.  Keeping your feet on the floor, lift your bottom from the seat and lock your elbows.  Keeping your elbows straight, allow gravity to pull your body weight down. Your shoulders will rise toward your ears.  Raise your body against gravity by drawing your shoulder blades down your back, shortening the distance between your shoulders and ears. Although your feet should always maintain contact with the floor, your feet should progressively  support less body weight as you get stronger.  Hold __________ seconds. In a controlled and slow manner, lower your body weight to begin the next repetition. Repeat __________ times. Complete this exercise __________ times per day.    This information is not intended to replace advice given to you by your health care provider. Make sure you discuss any questions you have with your health care provider.   Document Released: 10/30/2005 Document Revised: 01/06/2015 Document Reviewed: 03/30/2009 Elsevier Interactive Patient Education [employer].

## 2016-09-30 NOTE — Assessment & Plan Note (Signed)
Appears significantly improved. No signs of cellulitis on exam today. She'll finish the antibiotics. Given return precautions.

## 2016-10-01 ENCOUNTER — Telehealth: Payer: Self-pay | Admitting: Family

## 2016-10-01 ENCOUNTER — Other Ambulatory Visit: Payer: Medicare Other

## 2016-10-01 DIAGNOSIS — B379 Candidiasis, unspecified: Secondary | ICD-10-CM

## 2016-10-01 DIAGNOSIS — T3695XA Adverse effect of unspecified systemic antibiotic, initial encounter: Principal | ICD-10-CM

## 2016-10-01 MED ORDER — FLUCONAZOLE 150 MG PO TABS: 150.0000 mg | ORAL_TABLET | Freq: Once | ORAL | 1 refills | Status: AC

## 2016-10-01 NOTE — Telephone Encounter (Signed)
Sent in for patient. Please f/u if doesn't clear

## 2016-10-01 NOTE — Telephone Encounter (Signed)
Are you willing to prescribe due to medication SX? Please advise

## 2016-10-01 NOTE — Telephone Encounter (Signed)
Pt called stated that she came in yesterday to see Dr. Caryl Bis regarding reaction from flu shot and he prescribed her cephALEXin (KEFLEX) 500 MG capsule. Pt states that she has a yeast infection and is very uncomfortable and could we prescribe something for that? Please advise, thank you!  Call pt @ [facility] #[phone] - [place], [place] [address]

## 2016-10-01 NOTE — Telephone Encounter (Signed)
Left message for patient to return call back to inform her of medication placed for her symptoms.

## 2016-10-02 ENCOUNTER — Telehealth: Payer: Self-pay | Admitting: *Deleted

## 2016-10-02 NOTE — Telephone Encounter (Signed)
Patient returned Emma's call, appointment rescheduled to October 24th, 4:00, patient states "she was hoping to get in sooner, she's developed a problem with her arm, thinks it's a pinched nerve, every time she stretches out her arm it pops like an electrical shock but this appointment is okay."

## 2016-10-02 NOTE — Telephone Encounter (Signed)
Dr Jaynee Eagles- [facility]. See message below.

## 2016-10-02 NOTE — Telephone Encounter (Signed)
Called and LVM for pt to call back and r/s app ton 10/12. Apologized and stated Dr Jaynee Eagles was reviewing schedule. This is not a scheduled appt slot. This was a mistake. Can offer 4pm week of 10/23. Apologized again for any inconvenience.  Gave [facility] phone number.

## 2016-10-03 NOTE — Telephone Encounter (Signed)
Called patient, so sorry about her being shuffled around. She can be seen Monday at noon or 4pm or next week at your discretion. She could also see megan and I could stop in and see her with megan as well. She was not home, I left a message. Would you try her again later emma? thanks

## 2016-10-03 NOTE — Telephone Encounter (Signed)
Called and spoke to patient. Apologized again that her appt got moved around so much. She declined moving appt to Monday at 4pm. She wanted to keep appt for 10/24. She stated she appreciated the offer. Told her to call back if she changes her mind. We are happy to see her on Monday. She verbalized understanding.

## 2016-10-04 NOTE — Telephone Encounter (Signed)
Patient stated she was told not to take the medication due to her heart medication.

## 2016-10-09 DIAGNOSIS — K219 Gastro-esophageal reflux disease without esophagitis: Secondary | ICD-10-CM | POA: Diagnosis not present

## 2016-10-09 DIAGNOSIS — Z8719 Personal history of other diseases of the digestive system: Secondary | ICD-10-CM | POA: Diagnosis not present

## 2016-10-09 DIAGNOSIS — I491 Atrial premature depolarization: Secondary | ICD-10-CM | POA: Diagnosis not present

## 2016-10-09 DIAGNOSIS — Z7901 Long term (current) use of anticoagulants: Secondary | ICD-10-CM | POA: Diagnosis not present

## 2016-10-09 DIAGNOSIS — I481 Persistent atrial fibrillation: Secondary | ICD-10-CM | POA: Diagnosis not present

## 2016-10-09 DIAGNOSIS — J449 Chronic obstructive pulmonary disease, unspecified: Secondary | ICD-10-CM | POA: Diagnosis not present

## 2016-10-09 DIAGNOSIS — Z9889 Other specified postprocedural states: Secondary | ICD-10-CM | POA: Diagnosis not present

## 2016-10-09 DIAGNOSIS — Z7952 Long term (current) use of systemic steroids: Secondary | ICD-10-CM | POA: Diagnosis not present

## 2016-10-09 DIAGNOSIS — Z792 Long term (current) use of antibiotics: Secondary | ICD-10-CM | POA: Diagnosis not present

## 2016-10-09 DIAGNOSIS — M069 Rheumatoid arthritis, unspecified: Secondary | ICD-10-CM | POA: Diagnosis not present

## 2016-10-09 DIAGNOSIS — M19012 Primary osteoarthritis, left shoulder: Secondary | ICD-10-CM | POA: Diagnosis not present

## 2016-10-09 DIAGNOSIS — Z7951 Long term (current) use of inhaled steroids: Secondary | ICD-10-CM | POA: Diagnosis not present

## 2016-10-09 DIAGNOSIS — M19072 Primary osteoarthritis, left ankle and foot: Secondary | ICD-10-CM | POA: Diagnosis not present

## 2016-10-09 DIAGNOSIS — Z79899 Other long term (current) drug therapy: Secondary | ICD-10-CM | POA: Diagnosis not present

## 2016-10-09 DIAGNOSIS — Z87891 Personal history of nicotine dependence: Secondary | ICD-10-CM | POA: Diagnosis not present

## 2016-10-10 ENCOUNTER — Ambulatory Visit: Payer: Medicare Other | Admitting: Neurology

## 2016-10-14 ENCOUNTER — Ambulatory Visit: Payer: Medicare Other | Admitting: Neurology

## 2016-10-16 ENCOUNTER — Other Ambulatory Visit: Payer: Self-pay | Admitting: Family

## 2016-10-22 ENCOUNTER — Ambulatory Visit (INDEPENDENT_AMBULATORY_CARE_PROVIDER_SITE_OTHER): Payer: Medicare Other | Admitting: Neurology

## 2016-10-22 VITALS — BP 111/71 | HR 102 | Wt 206.6 lb

## 2016-10-22 DIAGNOSIS — M79602 Pain in left arm: Secondary | ICD-10-CM

## 2016-10-22 DIAGNOSIS — G250 Essential tremor: Secondary | ICD-10-CM | POA: Diagnosis not present

## 2016-10-22 DIAGNOSIS — G629 Polyneuropathy, unspecified: Secondary | ICD-10-CM | POA: Diagnosis not present

## 2016-10-22 MED ORDER — PREGABALIN 100 MG PO CAPS: 100.0000 mg | ORAL_CAPSULE | Freq: Two times a day (BID) | ORAL | 5 refills | Status: DC

## 2016-10-22 MED ORDER — METHYLPREDNISOLONE 4 MG PO TBPK: ORAL_TABLET | ORAL | 0 refills | Status: DC

## 2016-10-22 NOTE — Progress Notes (Signed)
[facility]    Provider:  Dr Jaynee Eagles Referring Provider: Rubbie Battiest, NP Primary Care Physician:  Mable Paris, FNP   CC:  Neuropathy  Interval history 10/22/2016: She has multiple complaints. She has left shoulder pain and when she stretches her arm she feels is down the left arm to the palm and down the 2 fingers. It started 2 weeks ago. Now she has tingling in digits 4-5 of the left hand. No neck pain and the shoulder is better. Will give steroids. Her tremor is getting worse. It is horrible. Worse with eating, worse with use. She can;t write. She has pain in the right hand too. She has tremors and tingles. She has been cleaning a lot maybe overuse.   HPI:  Brandy Armstrong is a 70 y.o. female here as a referral from Dr. Flonnie Overman for neuropathy. She has a PMHx of afib,anxiety. She has numbness, tingling, burning in the feet. Also has symptoms in the hands and feet. Slowly progressive. Feet and hands feel cold. Started a year ago. She was started on Gabapentin and then started the Lyrica. She is on 100mg  twice a day. She has gained weight with lyrica. She also has a tremor in the hands. She has had it for a year or more. She can't hold a spoon or fork, she drops things. No weakness. She has RA. She has had low B12 in the past and does not take B vitamins regularly. She is on Amiodarone chronically. No other family members with tremor. Her half brother has neuropathy. She has shooting pain in the toes as well. Her balance is OK. Foot pain is worse at night when laying in bed. Lyrica has helped but she has gained a lot of weight. She is on Amiodarone and symptoms may have started about the same time she started this medication. She drinks a lot of caffeine. No other focal neurologic deficits  Reviewed notes, labs and imaging from outside physicians, which showed:(personally reviewed imaging and agree with following)  Ct of the head 10/2015: There is no intra or extra-axial  fluid collection or mass lesion.The basilar cisterns and ventricles have a normal appearance. There is no CT evidence for acute infarction or hemorrhage.  Bone windows show no calvarial fracture. There is atherosclerotic calcification of the internal carotid arteries. There is significant sinus disease. Air-fluid levels in mucoperiosteal thickening are present in the paranasal sinuses. Mastoid air cells are normally  aerated.  IMPRESSION: 1. No evidence for acute intracranial abnormality. 2. Significant sinus disease.  HgbA1c 6.0 B12 268 mplains of symptoms per HPI as well as the following symptoms No chills or fevers. Pertinent negatives per HPI. All others negative.   Social History   Social History  . Marital status: Single    Spouse name: N/A  . Number of children: 0  . Years of education: 3   Occupational History  . Retired    Social History Main Topics  . Smoking status: Former Smoker    Packs/day: 1.00    Years: 40.00    Quit date: 12/16/2011  . Smokeless tobacco: Never Used  . Alcohol use 0.0 oz/week     Comment: Occasionally has a drink  . Drug use: No  . Sexual activity: No   Other Topics Concern  . Not on file   Social History Narrative   [name] was born and reared in [place]. She attended [employer] and graduated in 1971 with her [employer] in Education. 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 She then worked for a [employer] in [place]. She is single. She is currently retired. She loves reading. She loves to spend time with her dog.       Caffeine use: 2 cups coffee per day   2 glasses iced tea/ day    Family History  Problem Relation Age of Onset  . Depression Mother   . Cancer Mother 16    pancreatic cancer  . Cancer Father 37    colon cancer  . Cancer Sister 30    breast cancer  . Breast cancer Sister 29  . Diabetes Brother   . Neuropathy Neg Hx     Past Medical  History:  Diagnosis Date  . Atrial fibrillation [facility])    a. s/p ablation 01/2012 in [facility] by Dr. Boyd Kerbs;  b. On sotalol & Xarelto;  c. 02/2014 Echo: EF 50-55%, mild conc LVH, nl LA size/structure;  d. Recurrent afib 8/15 & 08/29/2014.  . Barretts esophagus   . Chest pain    a. 02/2014 Myoview: Ef 50%, no ischemia.  . Colon polyps   . COPD (chronic obstructive pulmonary disease) ([place])   . COPD (chronic obstructive pulmonary disease) ([place])   . Depression with anxiety   . GERD (gastroesophageal reflux disease)   . Neuropathy ([place])   . Rectal fistula   . Rheumatoid arthritis ([place])    On methotrexate and orencia  . Urinary incontinence   . Vitamin D deficiency     Past Surgical History:  Procedure Laterality Date  . ABDOMINAL HYSTERECTOMY  1992  . CARDIAC ELECTROPHYSIOLOGY STUDY AND ABLATION  2013  . CHOLECYSTECTOMY  1987  . COLONOSCOPY N/A 05/08/2015   Procedure: COLONOSCOPY;  Surgeon: Manya Silvas, MD;  Location: [facility] ENDOSCOPY;  Service: Endoscopy;  Laterality: N/A;  . ESOPHAGOGASTRODUODENOSCOPY N/A 05/06/2015   Procedure: ESOPHAGOGASTRODUODENOSCOPY (EGD);  Surgeon: Lollie Sails, MD;  Location: [facility] ENDOSCOPY;  Service: Endoscopy;  Laterality: N/A;  . oophrectomy Bilateral 1992    Current Outpatient Prescriptions  Medication Sig Dispense Refill  . albuterol (PROVENTIL HFA;VENTOLIN HFA) 108 (90 Base) MCG/ACT inhaler Inhale into the lungs.    [name] amiodarone (PACERONE) 200 MG tablet Take 200 mg by mouth daily.    . clonazePAM (KLONOPIN) 0.5 MG tablet TAKE 1 TABLET BY MOUTH 3 TIMES DAILY AS NEEDED FOR ANXIETY 90 tablet 1  . cyclobenzaprine (FLEXERIL) 10 MG tablet Take 1 tablet (10 mg total) by mouth at bedtime. 30 tablet 1  . ELIQUIS 5 MG TABS tablet TAKE 1 TABLET (5 MG TOTAL) BY MOUTH TWO (2) TIMES A DAY.  6  . escitalopram (LEXAPRO) 10 MG tablet Take 1 tablet (10 mg total) by mouth daily. 90 tablet 1  . esomeprazole (NEXIUM) 40 MG capsule TAKE ONE CAPSULE BY MOUTH DAILY AT 12  NOON 30 capsule 11  . ferrous sulfate 325 (65 FE) MG tablet Take 1 tablet (325 mg total) by mouth daily with breakfast. 30 tablet 3  . hydroxychloroquine (PLAQUENIL) 200 MG tablet Take 200 mg by mouth.    . leflunomide (ARAVA) 20 MG tablet Take 20 mg by mouth daily.    [name] lidocaine (XYLOCAINE) 5 % ointment APPLY 1 APPLICATION TOPICALLY AS NEEDED. 35.44 g 0  . lidocaine (XYLOCAINE) 5 % ointment Apply 1 application topically as needed.    . metoprolol succinate (TOPROL-XL) 25 MG 24 hr tablet Take 25 mg by mouth 2 (two) times daily.  6  . mupirocin ointment (BACTROBAN) 2 % Place 1 application into the nose 2 (two) times daily. 22 g 1  . ondansetron (ZOFRAN) 4 MG tablet Take 1 tablet (4 mg total) by mouth every 6 (six) hours as needed for nausea. 20 tablet 0  . predniSONE (DELTASONE) 10 MG tablet Take 40 mg by mouth on day 1, then taper 10 mg daily until gone 10 tablet 0  . pregabalin (LYRICA) 100 MG capsule Take 1 capsule (100 mg total) by mouth 3 (three) times daily as needed. 90 capsule 3  . SPIRIVA RESPIMAT 2.5 MCG/ACT AERS INHALE 2 PUFFS BY MOUTH DAILY.  6  . SYMBICORT 160-4.5 MCG/ACT inhaler INHALE 2 PUFFS TWO (2) TIMES A DAY. 10.2 Inhaler 1  . rosuvastatin (CRESTOR) 40 MG tablet Take 1 tablet (40 mg total) by mouth daily. (Patient not taking: Reported on 10/22/2016) 90 tablet 3  . topiramate (TOPAMAX) 25 MG tablet Take 50 mg by mouth at bedtime.      No current facility-administered medications for this visit.     Allergies as of 10/22/2016 - Review Complete 10/22/2016  Allergen Reaction Noted  . Latex  08/18/2013    Vitals: BP 111/71 (BP Location: Right Arm, Patient Position: Sitting, Cuff Size: Normal)   Pulse (!) 102   Wt 206 lb 9.6 oz (93.7 kg)   BMI 32.85 kg/m  Last Weight:  Wt Readings from Last 1 Encounters:  10/22/16 206 lb 9.6 oz (93.7 kg)   Last Height:   Ht Readings from Last 1 Encounters:  08/13/16 5' 6.5" (1.689 m)     Physical exam: Exam: Gen: NAD,  conversant, well nourised, obese, well groomed                     CV: RRR, no MRG. No Carotid Bruits. No peripheral edema, warm, nontender Eyes: Conjunctivae clear without exudates or hemorrhage  Neuro: Detailed Neurologic Exam  Speech:    Speech is normal; fluent and spontaneous with normal comprehension.  Cognition:    The patient is oriented to person, place, and time;     recent and remote memory intact;     language fluent;     normal attention, concentration,     fund of knowledge Cranial Nerves:    The pupils are equal, round, and reactive to light. The fundi are flat. Visual fields are full to finger confrontation. Extraocular movements are intact. Trigeminal sensation is intact and the muscles of mastication are normal. The face is symmetric. The palate elevates in the midline. Hearing intact. Voice is normal. Shoulder shrug is normal. The tongue has normal motion without fasciculations.   Coordination:    No ataxia  Gait:    Heel-toe and tandem gait are impiared.   Motor Observation:    No asymmetry, no atrophy, and no involuntary movements noted. Tone:    Normal muscle tone.    Posture:    Posture is normal. normal erect    Strength: mild bilat hip weakness. Otherwise strength is V/V in the upper and lower limbs.      Sensation: decreased pin prick and temp to knees. Absent proprioception and vibration at the great toes.      Reflex Exam:  DTR's: Absent AJs otherwise deep tendon reflexes in the upper and lower extremities are normal bilaterally.   Toes:    The toes are downgoing bilaterally.   Clonus:    Clonus is absent.       Assessment/Plan:  70  year old patient here for evaluation of peripheral polyneuropathy and multiple other problems. 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 She has several risk factors including elevated HgbA1c, Hx of B12 deficiency, Rheumatoid Arthritis and  Amiodarone usage.   Peripheral neuropathy: EMG/NCS: Was normal on the bilateral lowers and left upper Tremor; already on  Multiple medications for tremor: metoprolol, clonazepam, topamax and lyrica. These are all used in essential tremor.  Primidone interacts with eliquis. She is complaining about weight gain with Lyrica. She wants to stop the Lyrica. I advised her to decrease her Lyrica to bid dosing and see how this goes. Occupational therapy for difficulty with ADLs with tremor (eating, writing) maybe weighted utencils or other compenatory measures to help.  Shoulder and arm pain: may be overuse. Steroid taper f/u with pcp.  Sarina Ill, MD  [facility] [address] [place], [place] [address]  Phone [phone] Fax [phone]  A total of 30 minutes was spent face-to-face with this patient. Over half this time was spent on counseling patient on the tremor, arm pain diagnosis and different diagnostic and therapeutic options available.

## 2016-10-22 NOTE — Patient Instructions (Addendum)
Remember to drink plenty of fluid, eat healthy meals and do not skip any meals. Try to eat protein with a every meal and eat a healthy snack such as fruit or nuts in between meals. Try to keep a regular sleep-wake schedule and try to exercise daily, particularly in the form of walking, 20-30 minutes a day, if you can.   As far as your medications are concerned, I would like to suggest: 6 days of medrol dosepak for the shoulder and arm pain  I would like to see you back in 6 months, sooner if we need to. Please call us with any interim questions, concerns, problems, updates or refill requests.   Our phone number is [phone]. We also have an after hours call service for urgent matters and there is a physician on-call for urgent questions. For any emergencies you know to call 911 or go to the nearest emergency room  Methylprednisolone tablets What is this medicine? METHYLPREDNISOLONE (meth ill pred NISS oh lone) is a corticosteroid. It is commonly used to treat inflammation of the skin, joints, lungs, and other organs. Common conditions treated include asthma, allergies, and arthritis. It is also used for other conditions, such as blood disorders and diseases of the adrenal glands. This medicine may be used for other purposes; ask your health care provider or pharmacist if you have questions. What should I tell my health care provider before I take this medicine? They need to know if you have any of these conditions: -Cushing's syndrome -diabetes -glaucoma -heart problems or disease -high blood pressure -infection such as herpes, measles, tuberculosis, or chickenpox -kidney disease -liver disease -mental problems -myasthenia gravis -osteoporosis -seizures -stomach ulcer or intestine disease including colitis and diverticulitis -thyroid problem -an unusual or allergic reaction to lactose, methylprednisolone, other medicines, foods, dyes, or preservatives -pregnant or trying to get  pregnant -breast-feeding How should I use this medicine? Take this medicine by mouth with a drink of water. Follow the directions on the prescription label. Take it with food or milk to avoid stomach upset. If you are taking this medicine once a day, take it in the morning. Do not take more medicine than you are told to take. Do not suddenly stop taking your medicine because you may develop a severe reaction. Your doctor will tell you how much medicine to take. If your doctor wants you to stop the medicine, the dose may be slowly lowered over time to avoid any side effects. Talk to your pediatrician regarding the use of this medicine in children. Special care may be needed. Overdosage: If you think you have taken too much of this medicine contact a poison control center or emergency room at once. NOTE: This medicine is only for you. Do not share this medicine with others. What if I miss a dose? If you miss a dose, take it as soon as you can. If it is almost time for your next dose, talk to your doctor or health care professional. You may need to miss a dose or take an extra dose. Do not take double or extra doses without advice. What may interact with this medicine? Do not take this medicine with any of the following medications: -mifepristone This medicine may also interact with the following medications: -tacrolimus -vaccines -warfarin This list may not describe all possible interactions. Give your health care provider a list of all the medicines, herbs, non-prescription drugs, or dietary supplements you use. Also tell them if you smoke, drink alcohol, or use illegal  drugs. Some items may interact with your medicine. What should I watch for while using this medicine? Visit your doctor or health care professional for regular checks on your progress. If you are taking this medicine for a long time, carry an identification card with your name and address, the type and dose of your medicine, and your  doctor's name and address. The medicine may increase your risk of getting an infection. Stay away from people who are sick. Tell your doctor or health care professional if you are around anyone with measles or chickenpox. If you are going to have surgery, tell your doctor or health care professional that you have taken this medicine within the last twelve months. Ask your doctor or health care professional about your diet. You may need to lower the amount of salt you eat. The medicine can increase your blood sugar. If you are a diabetic check with your doctor if you need help adjusting the dose of your diabetic medicine. What side effects may I notice from receiving this medicine? Side effects that you should report to your doctor or health care professional as soon as possible: -allergic reactions like skin rash, itching or hives, swelling of the face, lips, or tongue -eye pain, decreased or blurred vision, or bulging eyes -fever, sore throat, sneezing, cough, or other signs of infection, wounds that will not heal -increased thirst -mental depression, mood swings, mistaken feelings of self importance or of being mistreated -pain in hips, back, ribs, arms, shoulders, or legs -swelling of the ankles, feet, hands -trouble passing urine or change in the amount of urine Side effects that usually do not require medical attention (report to your doctor or health care professional if they continue or are bothersome): -confusion, excitement, restlessness -headache -nausea, vomiting -skin problems, acne, thin and shiny skin -weight gain This list may not describe all possible side effects. Call your doctor for medical advice about side effects. You may report side effects to FDA at 1-800-FDA-1088. Where should I keep my medicine? Keep out of the reach of children. Store at room temperature between 20 and 25 degrees C (68 and 77 degrees F). Throw away any unused medicine after the expiration  date. NOTE: This sheet is a summary. It may not cover all possible information. If you have questions about this medicine, talk to your doctor, pharmacist, or health care provider.    2016, [employer]vier/[employer] Standard. (2012-09-15 11:38:34)

## 2016-10-24 ENCOUNTER — Telehealth: Payer: Self-pay | Admitting: Neurology

## 2016-10-24 ENCOUNTER — Ambulatory Visit: Payer: Medicare Other | Admitting: Neurology

## 2016-10-24 NOTE — Telephone Encounter (Signed)
Patient called requesting to speak with Terrence Dupont regarding topiramate (TOPAMAX) 25 MG tablet.

## 2016-10-24 NOTE — Telephone Encounter (Signed)
Called patient back. She was seen last on 10/22/16 by AA.MD. She states she spoke to Dr Jaynee Eagles at office visit about topiramate. She is taking this again , 2 tablets at bedtime. She last filled on 10/16/16.  Dr Jaynee Eagles last prescribed on 01/02/16 with 11 refills.  Here questions are: 1. Is there any benefit in taking for her tremors or losing weight? She does not notice any improvement in her tremors.   Advised I will send note to Dr Jaynee Eagles and call back to advise once I receive response.

## 2016-10-24 NOTE — Telephone Encounter (Signed)
Called and spoke to pt. Relayed per Dr Jaynee Eagles, she should stop topiramate if it is not helping her tremor. She verbalized understanding.

## 2016-10-24 NOTE — Telephone Encounter (Signed)
I would ask her to stop if it is not helping with tremor thanks

## 2016-10-28 ENCOUNTER — Ambulatory Visit (INDEPENDENT_AMBULATORY_CARE_PROVIDER_SITE_OTHER): Payer: Medicare Other | Admitting: Family

## 2016-10-28 ENCOUNTER — Encounter: Payer: Self-pay | Admitting: Family

## 2016-10-28 VITALS — BP 126/88 | HR 85 | Temp 98.1°F | Ht 66.5 in | Wt 205.6 lb

## 2016-10-28 DIAGNOSIS — D509 Iron deficiency anemia, unspecified: Secondary | ICD-10-CM | POA: Diagnosis not present

## 2016-10-28 DIAGNOSIS — I480 Paroxysmal atrial fibrillation: Secondary | ICD-10-CM | POA: Diagnosis not present

## 2016-10-28 NOTE — Patient Instructions (Addendum)
Labs.  Back in Afib. Please call cardiologist.   If there is no improvement in your symptoms, or if there is any worsening of symptoms, or if you have any additional concerns, please return for re-evaluation; or, if we are closed, consider going to the Emergency Room for evaluation if symptoms urgent.

## 2016-10-28 NOTE — Assessment & Plan Note (Signed)
Patient inconsistently been taking iron supplement. Pending CBC to see if anemia continues to improve.

## 2016-10-28 NOTE — Progress Notes (Signed)
Subjective:    Patient ID: Brandy Armstrong, female    DOB: 07/04/1946, 70 y.o.   MRN: [id number]  CC: Brandy Armstrong is a 70 y.o. female who presents today for follow up.   HPI: Patient for follow-up and is a chief complaint irregular heartbeat over past 3 days. She has a history of paroxsmal atrial fibrillation. On elliquis, metoprolol.   Seen 10//1 by Dr Foster Simpson. On apixaban for afib and no bleeding from medication. H/o GIB while on xarelto. AF Ablation 2013, repeat 01/2015. DCCV 09/25/2016 with restoration of sinus rhythm.  Denies exertional chest pain or pressure, numbness or tingling radiating to left arm or jaw, palpitations, dizziness, frequent headaches, changes in vision, or shortness of breath.       HISTORY:  Past Medical History:  Diagnosis Date  . Atrial fibrillation [facility])    a. s/p ablation 01/2012 in [facility] by Dr. Boyd Kerbs;  b. On sotalol & Xarelto;  c. 02/2014 Echo: EF 50-55%, mild conc LVH, nl LA size/structure;  d. Recurrent afib 8/15 & 08/29/2014.  . Barretts esophagus   . Chest pain    a. 02/2014 Myoview: Ef 50%, no ischemia.  . Colon polyps   . COPD (chronic obstructive pulmonary disease) ([place])   . COPD (chronic obstructive pulmonary disease) ([place])   . Depression with anxiety   . GERD (gastroesophageal reflux disease)   . Neuropathy ([place])   . Rectal fistula   . Rheumatoid arthritis ([place])    On methotrexate and orencia  . Urinary incontinence   . Vitamin D deficiency    Past Surgical History:  Procedure Laterality Date  . ABDOMINAL HYSTERECTOMY  1992  . CARDIAC ELECTROPHYSIOLOGY STUDY AND ABLATION  2013  . CHOLECYSTECTOMY  1987  . COLONOSCOPY N/A 05/08/2015   Procedure: COLONOSCOPY;  Surgeon: Manya Silvas, MD;  Location: [facility] ENDOSCOPY;  Service: Endoscopy;  Laterality: N/A;  . ESOPHAGOGASTRODUODENOSCOPY N/A 05/06/2015   Procedure: ESOPHAGOGASTRODUODENOSCOPY (EGD);  Surgeon: Lollie Sails, MD;  Location: [facility] ENDOSCOPY;  Service: Endoscopy;  Laterality:  N/A;  . oophrectomy Bilateral 1992   Family History  Problem Relation Age of Onset  . Depression Mother   . Cancer Mother 71    pancreatic cancer  . Cancer Father 70    colon cancer  . Cancer Sister 30    breast cancer  . Breast cancer Sister 62  . Diabetes Brother   . Neuropathy Neg Hx     Allergies: Latex Current Outpatient Prescriptions on File Prior to Visit  Medication Sig Dispense Refill  . albuterol (PROVENTIL HFA;VENTOLIN HFA) 108 (90 Base) MCG/ACT inhaler Inhale into the lungs.    [name] amiodarone (PACERONE) 200 MG tablet Take 200 mg by mouth daily.    . clonazePAM (KLONOPIN) 0.5 MG tablet TAKE 1 TABLET BY MOUTH 3 TIMES DAILY AS NEEDED FOR ANXIETY 90 tablet 1  . cyclobenzaprine (FLEXERIL) 10 MG tablet Take 1 tablet (10 mg total) by mouth at bedtime. 30 tablet 1  . ELIQUIS 5 MG TABS tablet TAKE 1 TABLET (5 MG TOTAL) BY MOUTH TWO (2) TIMES A DAY.  6  . escitalopram (LEXAPRO) 10 MG tablet Take 1 tablet (10 mg total) by mouth daily. 90 tablet 1  . esomeprazole (NEXIUM) 40 MG capsule TAKE ONE CAPSULE BY MOUTH DAILY AT 12 NOON 30 capsule 11  . ferrous sulfate 325 (65 FE) MG tablet Take 1 tablet (325 mg total) by mouth daily with breakfast. 30 tablet 3  . hydroxychloroquine (PLAQUENIL) 200 MG  tablet Take 200 mg by mouth.    . leflunomide (ARAVA) 20 MG tablet Take 20 mg by mouth daily.    [name] lidocaine (XYLOCAINE) 5 % ointment APPLY 1 APPLICATION TOPICALLY AS NEEDED. 35.44 g 0  . lidocaine (XYLOCAINE) 5 % ointment Apply 1 application topically as needed.    . methylPREDNISolone (MEDROL DOSEPAK) 4 MG TBPK tablet Take pills in the morning with food every day. 21 tablet 0  . metoprolol succinate (TOPROL-XL) 25 MG 24 hr tablet Take 25 mg by mouth 2 (two) times daily.   6  . mupirocin ointment (BACTROBAN) 2 % Place 1 application into the nose 2 (two) times daily. 22 g 1  . ondansetron (ZOFRAN) 4 MG tablet Take 1 tablet (4 mg total) by mouth every 6 (six) hours as needed for nausea. 20 tablet  0  . predniSONE (DELTASONE) 10 MG tablet Take 40 mg by mouth on day 1, then taper 10 mg daily until gone 10 tablet 0  . pregabalin (LYRICA) 100 MG capsule Take 1 capsule (100 mg total) by mouth 2 (two) times daily. 60 capsule 5  . rosuvastatin (CRESTOR) 40 MG tablet Take 1 tablet (40 mg total) by mouth daily. 90 tablet 3  . SPIRIVA RESPIMAT 2.5 MCG/ACT AERS INHALE 2 PUFFS BY MOUTH DAILY.  6  . SYMBICORT 160-4.5 MCG/ACT inhaler INHALE 2 PUFFS TWO (2) TIMES A DAY. 10.2 Inhaler 1   No current facility-administered medications on file prior to visit.     Social History  Substance Use Topics  . Smoking status: Former Smoker    Packs/day: 1.00    Years: 40.00    Quit date: 12/16/2011  . Smokeless tobacco: Never Used  . Alcohol use 0.0 oz/week     Comment: Occasionally has a drink    Review of Systems  Constitutional: Negative for chills and fever.  Respiratory: Negative for cough.   Cardiovascular: Positive for palpitations. Negative for chest pain.  Gastrointestinal: Negative for nausea and vomiting.      Objective:    BP 126/88   Pulse 85   Temp 98.1 F (36.7 C) (Oral)   Ht 5' 6.5" (1.689 m)   Wt 205 lb 9.6 oz (93.3 kg)   SpO2 96%   BMI 32.69 kg/m  BP Readings from Last 3 Encounters:  10/28/16 126/88  10/22/16 111/71  09/30/16 (!) 142/80   Wt Readings from Last 3 Encounters:  10/28/16 205 lb 9.6 oz (93.3 kg)  10/22/16 206 lb 9.6 oz (93.7 kg)  09/30/16 206 lb 6 oz (93.6 kg)    Physical Exam  Constitutional: She appears well-developed and well-nourished.  Eyes: Conjunctivae are normal.  Cardiovascular: Normal rate, normal heart sounds and normal pulses.  An irregularly irregular rhythm present.  Pulmonary/Chest: Effort normal and breath sounds normal. She has no wheezes. She has no rhonchi. She has no rales.  Neurological: She is alert.  Skin: Skin is warm and dry.  Psychiatric: She has a normal mood and affect. Her speech is normal and behavior is normal. Thought  content normal.  Vitals reviewed.      Assessment & Plan:   Problem List Items Addressed This Visit      Cardiovascular and Mediastinum   Atrial fibrillation ([place]) - Primary    Atrial fibrillation. When compared to prior EKG 08/2016, no significant changes. No signs of ischemia. Rate controlled. Patient will follow up with cardiologist regarding further intervention.       Relevant Orders   EKG 12-Lead (Completed)  Other   Anemia    Patient inconsistently been taking iron supplement. Pending CBC to see if anemia continues to improve.      Relevant Orders   CBC with Differential/Platelet   IBC panel    Other Visit Diagnoses   None.      I am having Ms. Ferraz maintain her leflunomide, metoprolol succinate, clonazePAM, escitalopram, amiodarone, esomeprazole, albuterol, lidocaine, SPIRIVA RESPIMAT, ELIQUIS, hydroxychloroquine, SYMBICORT, predniSONE, cyclobenzaprine, lidocaine, ondansetron, ferrous sulfate, rosuvastatin, mupirocin ointment, pregabalin, and methylPREDNISolone.   No orders of the defined types were placed in this encounter.   Return precautions given.   Risks, benefits, and alternatives of the medications and treatment plan prescribed today were discussed, and patient expressed understanding.   Education regarding symptom management and diagnosis given to patient on AVS.  Continue to follow with Mable Paris, FNP for routine health maintenance.   [name] and I agreed with plan.   Mable Paris, FNP

## 2016-10-28 NOTE — Assessment & Plan Note (Signed)
Atrial fibrillation. When compared to prior EKG 08/2016, no significant changes. No signs of ischemia. Rate controlled. Patient will follow up with cardiologist regarding further intervention.

## 2016-11-04 ENCOUNTER — Other Ambulatory Visit: Payer: Self-pay

## 2016-11-04 MED ORDER — BUDESONIDE-FORMOTEROL FUMARATE 160-4.5 MCG/ACT IN AERO: INHALATION_SPRAY | RESPIRATORY_TRACT | 1 refills | Status: DC

## 2016-11-04 MED ORDER — FERROUS SULFATE 325 (65 FE) MG PO TABS: 325.0000 mg | ORAL_TABLET | Freq: Every day | ORAL | 3 refills | Status: DC

## 2016-11-04 MED ORDER — PREGABALIN 100 MG PO CAPS: 100.0000 mg | ORAL_CAPSULE | Freq: Two times a day (BID) | ORAL | 1 refills | Status: DC

## 2016-11-04 NOTE — Telephone Encounter (Signed)
Medication has been refilled.

## 2016-11-04 NOTE — Telephone Encounter (Signed)
Last filled 05/21/15 by you, looks like Dr Jaynee Eagles  Neurologist just sent in on  10/22/16  ,

## 2016-11-05 ENCOUNTER — Other Ambulatory Visit: Payer: Self-pay

## 2016-11-05 MED ORDER — BUDESONIDE-FORMOTEROL FUMARATE 160-4.5 MCG/ACT IN AERO: INHALATION_SPRAY | RESPIRATORY_TRACT | 1 refills | Status: DC

## 2016-11-05 NOTE — Telephone Encounter (Signed)
Medication has been refilled.

## 2016-11-06 DIAGNOSIS — Z87891 Personal history of nicotine dependence: Secondary | ICD-10-CM | POA: Diagnosis not present

## 2016-11-06 DIAGNOSIS — K219 Gastro-esophageal reflux disease without esophagitis: Secondary | ICD-10-CM | POA: Diagnosis not present

## 2016-11-06 DIAGNOSIS — R42 Dizziness and giddiness: Secondary | ICD-10-CM | POA: Diagnosis not present

## 2016-11-06 DIAGNOSIS — M069 Rheumatoid arthritis, unspecified: Secondary | ICD-10-CM | POA: Diagnosis not present

## 2016-11-06 DIAGNOSIS — I481 Persistent atrial fibrillation: Secondary | ICD-10-CM | POA: Diagnosis not present

## 2016-11-06 DIAGNOSIS — J449 Chronic obstructive pulmonary disease, unspecified: Secondary | ICD-10-CM | POA: Diagnosis not present

## 2016-11-06 DIAGNOSIS — Z7951 Long term (current) use of inhaled steroids: Secondary | ICD-10-CM | POA: Diagnosis not present

## 2016-11-10 DIAGNOSIS — N3000 Acute cystitis without hematuria: Secondary | ICD-10-CM | POA: Diagnosis not present

## 2016-11-13 ENCOUNTER — Ambulatory Visit: Payer: Medicare Other | Admitting: Family

## 2016-11-13 ENCOUNTER — Encounter: Payer: Medicare Other | Admitting: Family

## 2016-11-28 DIAGNOSIS — I481 Persistent atrial fibrillation: Secondary | ICD-10-CM | POA: Diagnosis not present

## 2017-01-10 ENCOUNTER — Telehealth: Payer: Self-pay | Admitting: *Deleted

## 2017-01-10 MED ORDER — ESCITALOPRAM OXALATE 10 MG PO TABS: 10.0000 mg | ORAL_TABLET | Freq: Every day | ORAL | 1 refills | Status: DC

## 2017-01-10 NOTE — Telephone Encounter (Signed)
Medication has been refilled.

## 2017-01-10 NOTE — Telephone Encounter (Signed)
Pt has requested a medication refill for [place]

## 2017-01-28 ENCOUNTER — Ambulatory Visit (INDEPENDENT_AMBULATORY_CARE_PROVIDER_SITE_OTHER): Payer: Medicare Other | Admitting: Family

## 2017-01-28 ENCOUNTER — Encounter: Payer: Self-pay | Admitting: Family

## 2017-01-28 VITALS — BP 112/76 | HR 103 | Temp 98.0°F

## 2017-01-28 DIAGNOSIS — G629 Polyneuropathy, unspecified: Secondary | ICD-10-CM

## 2017-01-28 DIAGNOSIS — I480 Paroxysmal atrial fibrillation: Secondary | ICD-10-CM

## 2017-01-28 DIAGNOSIS — R251 Tremor, unspecified: Secondary | ICD-10-CM | POA: Diagnosis not present

## 2017-01-28 LAB — CBC WITH DIFFERENTIAL/PLATELET
Basophils Absolute: 0.1 10*3/uL (ref 0.0–0.1)
Basophils Relative: 0.9 % (ref 0.0–3.0)
EOS PCT: 3.1 % (ref 0.0–5.0)
Eosinophils Absolute: 0.2 10*3/uL (ref 0.0–0.7)
HCT: 34.5 % — ABNORMAL LOW (ref 36.0–46.0)
HEMOGLOBIN: 10.5 g/dL — AB (ref 12.0–15.0)
Lymphocytes Relative: 22.6 % (ref 12.0–46.0)
Lymphs Abs: 1.6 10*3/uL (ref 0.7–4.0)
MCHC: 30.5 g/dL (ref 30.0–36.0)
MCV: 69.7 fl — AB (ref 78.0–100.0)
MONOS PCT: 11.6 % (ref 3.0–12.0)
Monocytes Absolute: 0.8 10*3/uL (ref 0.1–1.0)
NEUTROS PCT: 61.8 % (ref 43.0–77.0)
Neutro Abs: 4.3 10*3/uL (ref 1.4–7.7)
PLATELETS: 231 10*3/uL (ref 150.0–400.0)
RBC: 4.95 Mil/uL (ref 3.87–5.11)
RDW: 18.1 % — AB (ref 11.5–15.5)
WBC: 6.9 10*3/uL (ref 4.0–10.5)

## 2017-01-28 LAB — IBC PANEL
Iron: 23 ug/dL — ABNORMAL LOW (ref 42–145)
SATURATION RATIOS: 5.2 % — AB (ref 20.0–50.0)
Transferrin: 313 mg/dL (ref 212.0–360.0)

## 2017-01-28 LAB — COMPREHENSIVE METABOLIC PANEL
ALBUMIN: 3.8 g/dL (ref 3.5–5.2)
ALK PHOS: 100 U/L (ref 39–117)
ALT: 6 U/L (ref 0–35)
AST: 15 U/L (ref 0–37)
BUN: 13 mg/dL (ref 6–23)
CALCIUM: 9 mg/dL (ref 8.4–10.5)
CO2: 28 mEq/L (ref 19–32)
Chloride: 104 mEq/L (ref 96–112)
Creatinine, Ser: 0.9 mg/dL (ref 0.40–1.20)
GFR: 66.09 mL/min (ref >60.00)
GLUCOSE: 85 mg/dL (ref 70–99)
POTASSIUM: 4.1 meq/L (ref 3.5–5.1)
Sodium: 138 mEq/L (ref 135–145)
TOTAL PROTEIN: 6.6 g/dL (ref 6.0–8.3)
Total Bilirubin: 0.5 mg/dL (ref 0.2–1.2)

## 2017-01-28 MED ORDER — TOPIRAMATE 25 MG PO TABS: 25.0000 mg | ORAL_TABLET | Freq: Every day | ORAL | 0 refills | Status: DC

## 2017-01-28 NOTE — Progress Notes (Signed)
Subjective:    Patient ID: Brandy Armstrong, female    DOB: 1946-05-30, 71 y.o.   MRN: [id number]  CC: Brandy Armstrong is a 71 y.o. female who presents today for follow up.   HPI: Complains of tremors 'all the time' in hands for past year, worsening. Trouble holding fork. No pill rolling, shuffing/slow gait.   No alcohol. Drinks 2-3 cups caffeine.   Notes unsteady balance of late. No falls.  No exercise at this time however just joined gym through [employer] and plans to start riding the bike more and doing some weightlifting.  On lyrica and toprol. Dosesnt recall if topamax or klonpin helped tremor.    Follows with Dr Luana Shu cardiology. Echo 10/2016, normal EF  Has seen Dr. Jaynee Eagles. Primidone interacts with eliquis. She has decreased lyrica to BID dosing as recommended. No longer on topamax.   HISTORY:  Past Medical History:  Diagnosis Date  . Atrial fibrillation [facility])    a. s/p ablation 01/2012 in [facility] by Dr. Boyd Kerbs;  b. On sotalol & Xarelto;  c. 02/2014 Echo: EF 50-55%, mild conc LVH, nl LA size/structure;  d. Recurrent afib 8/15 & 08/29/2014.  . Barretts esophagus   . Chest pain    a. 02/2014 Myoview: Ef 50%, no ischemia.  . Colon polyps   . COPD (chronic obstructive pulmonary disease) ([name])   . COPD (chronic obstructive pulmonary disease) ([place])   . Depression with anxiety   . GERD (gastroesophageal reflux disease)   . Neuropathy ([place])   . Rectal fistula   . Rheumatoid arthritis ([place])    On methotrexate and orencia  . Urinary incontinence   . Vitamin D deficiency    Past Surgical History:  Procedure Laterality Date  . ABDOMINAL HYSTERECTOMY  1992  . CARDIAC ELECTROPHYSIOLOGY STUDY AND ABLATION  2013  . CHOLECYSTECTOMY  1987  . COLONOSCOPY N/A 05/08/2015   Procedure: COLONOSCOPY;  Surgeon: Manya Silvas, MD;  Location: [facility] ENDOSCOPY;  Service: Endoscopy;  Laterality: N/A;  . ESOPHAGOGASTRODUODENOSCOPY N/A 05/06/2015   Procedure: ESOPHAGOGASTRODUODENOSCOPY  (EGD);  Surgeon: Lollie Sails, MD;  Location: [facility] ENDOSCOPY;  Service: Endoscopy;  Laterality: N/A;  . oophrectomy Bilateral 1992   Family History  Problem Relation Age of Onset  . Depression Mother   . Cancer Mother 72    pancreatic cancer  . Cancer Father 64    colon cancer  . Cancer Sister 30    breast cancer  . Breast cancer Sister 10  . Diabetes Brother   . Neuropathy Neg Hx     Allergies: Latex Current Outpatient Prescriptions on File Prior to Visit  Medication Sig Dispense Refill  . albuterol (PROVENTIL HFA;VENTOLIN HFA) 108 (90 Base) MCG/ACT inhaler Inhale into the lungs.    . budesonide-formoterol (SYMBICORT) 160-4.5 MCG/ACT inhaler INHALE 2 PUFFS TWO (2) TIMES A DAY. 10.2 Inhaler 1  . clonazePAM (KLONOPIN) 0.5 MG tablet TAKE 1 TABLET BY MOUTH 3 TIMES DAILY AS NEEDED FOR ANXIETY 90 tablet 1  . cyclobenzaprine (FLEXERIL) 10 MG tablet Take 1 tablet (10 mg total) by mouth at bedtime. 30 tablet 1  . ELIQUIS 5 MG TABS tablet TAKE 1 TABLET (5 MG TOTAL) BY MOUTH TWO (2) TIMES A DAY.  6  . escitalopram (LEXAPRO) 10 MG tablet Take 1 tablet (10 mg total) by mouth daily. 90 tablet 1  . esomeprazole (NEXIUM) 40 MG capsule TAKE ONE CAPSULE BY MOUTH DAILY AT 12 NOON 30 capsule 11  . ferrous sulfate 325 (  65 FE) MG tablet Take 1 tablet (325 mg total) by mouth daily with breakfast. 30 tablet 3  . hydroxychloroquine (PLAQUENIL) 200 MG tablet Take 200 mg by mouth.    . leflunomide (ARAVA) 20 MG tablet Take 20 mg by mouth daily.    [name] lidocaine (XYLOCAINE) 5 % ointment Apply 1 application topically as needed.    . metoprolol succinate (TOPROL-XL) 25 MG 24 hr tablet Take 25 mg by mouth 2 (two) times daily.   6  . mupirocin ointment (BACTROBAN) 2 % Place 1 application into the nose 2 (two) times daily. 22 g 1  . ondansetron (ZOFRAN) 4 MG tablet Take 1 tablet (4 mg total) by mouth every 6 (six) hours as needed for nausea. 20 tablet 0  . pregabalin (LYRICA) 100 MG capsule Take 1 capsule (100  mg total) by mouth 2 (two) times daily. 180 capsule 1  . SPIRIVA RESPIMAT 2.5 MCG/ACT AERS INHALE 2 PUFFS BY MOUTH DAILY.  6   No current facility-administered medications on file prior to visit.     Social History  Substance Use Topics  . Smoking status: Former Smoker    Packs/day: 1.00    Years: 40.00    Quit date: 12/16/2011  . Smokeless tobacco: Never Used  . Alcohol use 0.0 oz/week     Comment: Occasionally has a drink    Review of Systems  Constitutional: Negative for chills and fever.  Respiratory: Negative for cough.   Cardiovascular: Negative for chest pain and palpitations.  Gastrointestinal: Negative for nausea and vomiting.  Neurological: Positive for tremors and weakness. Negative for dizziness, numbness and headaches.      Objective:    BP 112/76   Pulse (!) 103   Temp 98 F (36.7 C) (Oral)  BP Readings from Last 3 Encounters:  01/28/17 112/76  10/28/16 126/88  10/22/16 111/71   Wt Readings from Last 3 Encounters:  10/28/16 205 lb 9.6 oz (93.3 kg)  10/22/16 206 lb 9.6 oz (93.7 kg)  09/30/16 206 lb 6 oz (93.6 kg)    Physical Exam  Constitutional: She appears well-developed and well-nourished.  HENT:  Mouth/Throat: Uvula is midline, oropharynx is clear and moist and mucous membranes are normal.  Eyes: Conjunctivae and EOM are normal. Pupils are equal, round, and reactive to light.  Fundus normal bilaterally.   Cardiovascular: Normal rate, regular rhythm, normal heart sounds and normal pulses.   Pulmonary/Chest: Effort normal and breath sounds normal. She has no wheezes. She has no rhonchi. She has no rales.  Neurological: She is alert. She displays tremor. No cranial nerve deficit or sensory deficit. She displays a negative Romberg sign.  Reflex Scores:      Bicep reflexes are 2+ on the right side and 2+ on the left side.      Patellar reflexes are 2+ on the right side and 2+ on the left side. Grip equal and strong bilateral upper extremities. Gait  wide based when walking, and unsteady. No ataxia.  Able to perform rapid alternating movement without difficulty.  Resting tremor of hands; disappears with intention.   Skin: Skin is warm and dry.  Psychiatric: She has a normal mood and affect. Her speech is normal and behavior is normal. Thought content normal.  Vitals reviewed.      Assessment & Plan:   Problem List Items Addressed This Visit      Cardiovascular and Mediastinum   Atrial fibrillation (HCC)    Chronic. Symptomatically stable. Will follow  Nervous and Auditory   Neuropathy ([place])    Controlled on lyrica. Will continue        Other   Tremors of nervous system - Primary    Worsening. B12,, tsh, folate and a1c normal couple of months ago.Tremor improves with intention. Upon walking patient up and down the hallway, her gait was wide-based and unsteady. This is a new finding as of today and considering lack of exercise/deconditioning playing role. Discussed the role of PT after patient has seen  Neurology.  Due to insurance reasons, patient is unable to get back to neurology, Dr. Jaynee Eagles, and we jointly agreed to place referral to [facility] neurology for evaluation as the patient has charity care with [facility]. Concern for progressive nature of underlying neurologic condition. Otherwise, patient and I agreed she would trial topamax daily for benign essential tremor. If not improvement, consider klonopin QD. She is on eliquis, unable to take primidone.      Relevant Medications   topiramate (TOPAMAX) 25 MG tablet   Other Relevant Orders   CBC with Differential/Platelet   IBC panel   Comprehensive metabolic panel   Ambulatory referral to Neurology       I have discontinued Ms. Wyka's amiodarone, predniSONE, rosuvastatin, and methylPREDNISolone. I am also having her start on topiramate. Additionally, I am having her maintain her leflunomide, metoprolol succinate, clonazePAM, esomeprazole, albuterol, SPIRIVA RESPIMAT,  ELIQUIS, hydroxychloroquine, cyclobenzaprine, lidocaine, ondansetron, mupirocin ointment, ferrous sulfate, pregabalin, budesonide-formoterol, and escitalopram.   Meds ordered this encounter  Medications  . topiramate (TOPAMAX) 25 MG tablet    Sig: Take 1 tablet (25 mg total) by mouth at bedtime.    Dispense:  90 tablet    Refill:  0    Order Specific Question:   Supervising Provider    Answer:   Crecencio Mc [[id number]]    Return precautions given.   Risks, benefits, and alternatives of the medications and treatment plan prescribed today were discussed, and patient expressed understanding.   Education regarding symptom management and diagnosis given to patient on AVS.  Continue to follow with Mable Paris, FNP for routine health maintenance.   [name] and I agreed with plan.   Mable Paris, FNP

## 2017-01-28 NOTE — Assessment & Plan Note (Addendum)
Worsening. B12,, tsh, folate and a1c normal couple of months ago.Tremor improves with intention. Upon walking patient up and down the hallway, her gait was wide-based and unsteady. This is a new finding as of today and considering lack of exercise/deconditioning playing role. Discussed the role of PT after patient has seen  Neurology.  Due to insurance reasons, patient is unable to get back to neurology, Dr. Jaynee Eagles, and we jointly agreed to place referral to [facility] neurology for evaluation as the patient has charity care with [facility]. Concern for progressive nature of underlying neurologic condition. Otherwise, patient and I agreed she would trial topamax daily for benign essential tremor. If not improvement, consider klonopin QD. She is on eliquis, unable to take primidone.

## 2017-01-28 NOTE — Patient Instructions (Signed)
Trial of Topamax and exercise. Also note if tremors improve with decreasing caffeine.  Note: The initial dose of topiramate is 25 mg once; however you may titrate up to twice a day to see if helps  If there is no improvement in your symptoms, or if there is any worsening of symptoms, or if you have any additional concerns, please return for re-evaluation; or, if we are closed, consider going to the Emergency Room for evaluation if symptoms urgent.

## 2017-01-28 NOTE — Assessment & Plan Note (Signed)
Controlled on lyrica. Will continue

## 2017-01-28 NOTE — Progress Notes (Signed)
Pre visit review using our clinic review tool, if applicable. No additional management support is needed unless otherwise documented below in the visit note. 

## 2017-01-28 NOTE — Assessment & Plan Note (Signed)
Chronic. Symptomatically stable. Will follow

## 2017-01-29 ENCOUNTER — Other Ambulatory Visit: Payer: Self-pay | Admitting: Family

## 2017-01-29 ENCOUNTER — Encounter: Payer: Self-pay | Admitting: Family

## 2017-01-29 DIAGNOSIS — D509 Iron deficiency anemia, unspecified: Secondary | ICD-10-CM

## 2017-01-29 NOTE — Progress Notes (Signed)
ordered

## 2017-02-09 ENCOUNTER — Encounter: Payer: Self-pay | Admitting: Family

## 2017-02-12 ENCOUNTER — Ambulatory Visit (INDEPENDENT_AMBULATORY_CARE_PROVIDER_SITE_OTHER): Payer: Medicare Other | Admitting: Neurology

## 2017-02-12 ENCOUNTER — Encounter: Payer: Self-pay | Admitting: Neurology

## 2017-02-12 VITALS — BP 99/68 | HR 67 | Ht 66.5 in | Wt 211.0 lb

## 2017-02-12 DIAGNOSIS — G250 Essential tremor: Secondary | ICD-10-CM | POA: Diagnosis not present

## 2017-02-12 MED ORDER — GABAPENTIN 600 MG PO TABS: 600.0000 mg | ORAL_TABLET | Freq: Three times a day (TID) | ORAL | 6 refills | Status: DC

## 2017-02-12 NOTE — Progress Notes (Signed)
[id number]    Provider:  Dr Jaynee Eagles Referring Provider: [name], FNP Primary Care Physician:  Mable Paris, FNP  CC: Essential Tremor  Interval history 02/12/2017:  Essential Tremor is worsening. She is on metoprolol for atrial fibrillation.  Discussed we can contact Dr. Luana Shu to see if we can switch to propranolol which may be better for essential tremor. Can't use primidone because of eliquis. Can switch from Lyrica back to Gabapentin. Can start at 600mg  three times a day. She had side effects to the Topiramate(memory loss, blurred vision). She was on Clonazepam at some point but it is concerning to take more in older patient. She would like to be referred to [facility] to discuss deep brain stimulation or any other procedures as her tremor is significantly affecting her life, she can't write, she is very distressed over it.   [id number] C Cooperis a 71 y.o.femalehere as a referral from Dr. Hoyt Koch neuropathy. She has a PMHx of afib,anxiety. She has numbness, tingling, burning in the feet. Also has symptoms in the hands and feet. Slowly progressive. Feet and hands feel cold. Started a year ago. She was started on Gabapentin and then started the Lyrica. She is on 100mg  twice a day. She has gained weight with lyrica. She also has a tremor in the hands. She has had it for a year or more. She can't hold a spoon or fork, she drops things. No weakness. She has RA. She has had low B12 in the past and does not take B vitamins regularly. She is on Amiodarone chronically. No other family members with tremor. Her half brother has neuropathy. She has shooting pain in the toes as well. Her balance is OK. Foot pain is worse at night when laying in bed. Lyrica has helped but she has gained a lot of weight. She is on Amiodarone and symptoms may have started about the same time she started this medication. She drinks a lot of caffeine. No other focal neurologic  deficits  Reviewed notes, labs and imaging from outside physicians, which showed:(personally reviewed imaging and agree with following)  Ct of the head 10/2015: There is no intra or extra-axial fluid collection or mass lesion.The basilar cisterns and ventricles have a normal appearance. There is no CT evidence for acute infarction or hemorrhage.  Bone windows show no calvarial fracture. There is atherosclerotic calcification of the internal carotid arteries. There is significant sinus disease. Air-fluid levels in mucoperiosteal thickening are present in the paranasal sinuses. Mastoid air cells are normally  aerated.  IMPRESSION: 1. No evidence for acute intracranial abnormality. 2. Significant sinus disease.  HgbA1c 6.0 B12 268 mplains of symptoms per HPI as well as the following symptoms No chills or fevers. Pertinent negatives per HPI. All others negative.  Social History   Social History  . Marital status: Single    Spouse name: N/A  . Number of children: 0  . Years of education: 65   Occupational History  . Retired    Social History Main Topics  . Smoking status: Former Smoker    Packs/day: 1.00    Years: 40.00    Quit date: 12/16/2011  . Smokeless tobacco: Never Used  . Alcohol use 0.0 oz/week     Comment: Occasionally has a drink  . Drug use: No  . Sexual activity: No   Other Topics Concern  . Not on file   Social History Narrative   Ms. Dashner was born and reared in [place]. She  attended [employer] and graduated in 1971 with her Bachelors in Education. 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 She then worked for a [employer] in [place]. She is single. She is currently retired. She loves reading. She loves to spend time with her dog.       Caffeine use: 2 cups coffee per day   2 glasses iced tea/ day   Right-handed    Family History  Problem Relation Age of Onset  . Depression Mother    . Cancer Mother 46    pancreatic cancer  . Cancer Father 90    colon cancer  . Cancer Sister 30    breast cancer  . Breast cancer Sister 74  . Diabetes Brother   . Neuropathy Neg Hx     Past Medical History:  Diagnosis Date  . Atrial fibrillation [facility])    a. s/p ablation 01/2012 in [facility] by Dr. Boyd Kerbs;  b. On sotalol & Xarelto;  c. 02/2014 Echo: EF 50-55%, mild conc LVH, nl LA size/structure;  d. Recurrent afib 8/15 & 08/29/2014.  . Barretts esophagus   . Chest pain    a. 02/2014 Myoview: Ef 50%, no ischemia.  . Colon polyps   . COPD (chronic obstructive pulmonary disease) ([place])   . COPD (chronic obstructive pulmonary disease) ([place])   . Depression with anxiety   . GERD (gastroesophageal reflux disease)   . Neuropathy ([place])   . Rectal fistula   . Rheumatoid arthritis ([place])    On methotrexate and orencia  . Urinary incontinence   . Vitamin D deficiency     Past Surgical History:  Procedure Laterality Date  . ABDOMINAL HYSTERECTOMY  1992  . CARDIAC ELECTROPHYSIOLOGY STUDY AND ABLATION  2013  . CHOLECYSTECTOMY  1987  . COLONOSCOPY N/A 05/08/2015   Procedure: COLONOSCOPY;  Surgeon: Manya Silvas, MD;  Location: [facility] ENDOSCOPY;  Service: Endoscopy;  Laterality: N/A;  . ESOPHAGOGASTRODUODENOSCOPY N/A 05/06/2015   Procedure: ESOPHAGOGASTRODUODENOSCOPY (EGD);  Surgeon: Lollie Sails, MD;  Location: [facility] ENDOSCOPY;  Service: Endoscopy;  Laterality: N/A;  . oophrectomy Bilateral 1992    Current Outpatient Prescriptions  Medication Sig Dispense Refill  . albuterol (PROVENTIL HFA;VENTOLIN HFA) 108 (90 Base) MCG/ACT inhaler Inhale into the lungs.    . budesonide-formoterol (SYMBICORT) 160-4.5 MCG/ACT inhaler INHALE 2 PUFFS TWO (2) TIMES A DAY. 10.2 Inhaler 1  . ELIQUIS 5 MG TABS tablet TAKE 1 TABLET (5 MG TOTAL) BY MOUTH TWO (2) TIMES A DAY.  6  . escitalopram (LEXAPRO) 10 MG tablet Take 1 tablet (10 mg total) by mouth daily. 90 tablet 1  . esomeprazole (NEXIUM) 40 MG capsule  TAKE ONE CAPSULE BY MOUTH DAILY AT 12 NOON 30 capsule 11  . ferrous sulfate 325 (65 FE) MG tablet Take 1 tablet (325 mg total) by mouth daily with breakfast. 30 tablet 3  . hydroxychloroquine (PLAQUENIL) 200 MG tablet Take 200 mg by mouth.    . leflunomide (ARAVA) 20 MG tablet Take 20 mg by mouth daily.    [name] lidocaine (XYLOCAINE) 5 % ointment Apply 1 application topically as needed.    . metoprolol succinate (TOPROL-XL) 25 MG 24 hr tablet Take 25 mg by mouth 2 (two) times daily.   6  . mupirocin ointment (BACTROBAN) 2 % Place 1 application into the nose 2 (two) times daily. 22 g 1  . ondansetron (ZOFRAN) 4 MG tablet Take 1 tablet (4 mg total) by mouth  every 6 (six) hours as needed for nausea. 20 tablet 0  . SPIRIVA RESPIMAT 2.5 MCG/ACT AERS INHALE 2 PUFFS BY MOUTH DAILY.  6  . gabapentin (NEURONTIN) 600 MG tablet Take 1 tablet (600 mg total) by mouth 3 (three) times daily. 90 tablet 6   No current facility-administered medications for this visit.     Allergies as of 02/12/2017 - Review Complete 02/12/2017  Allergen Reaction Noted  . Latex  08/18/2013    Vitals: BP 99/68   Pulse 67   Ht 5' 6.5" (1.689 m)   Wt 211 lb (95.7 kg)   BMI 33.55 kg/m  Last Weight:  Wt Readings from Last 1 Encounters:  02/12/17 211 lb (95.7 kg)   Last Height:   Ht Readings from Last 1 Encounters:  02/12/17 5' 6.5" (1.689 m)        Physical exam: Exam: Gen: NAD, conversant, well nourised, obese, well groomed  CV: RRR, no MRG. No Carotid Bruits. No peripheral edema, warm, nontender Eyes: Conjunctivae clear without exudates or hemorrhage  Neuro: Detailed Neurologic Exam  Speech: Speech is normal; fluent and spontaneous with normal comprehension.  Cognition: The patient is oriented to person, place, and time;  recent and remote memory intact;  language fluent;  normal attention, concentration,  fund of knowledge Cranial Nerves: The pupils are equal,  round, and reactive to light. The fundi are flat. Visual fields are full to finger confrontation. Extraocular movements are intact. Trigeminal sensation is intact and the muscles of mastication are normal. The face is symmetric. The palate elevates in the midline. Hearing intact. Voice is normal. Shoulder shrug is normal. The tongue has normal motion without fasciculations.   Coordination: No ataxia  Gait: Heel-toe and tandem gait are impiared.   Motor Observation:  High frequency, low amplitude postural tremor Tone: Normal muscle tone.   Posture: Posture is normal. normal erect  Strength: mild bilat hip weakness. Otherwise strength is V/V in the upper and lower limbs.   Sensation: decreased pin prick and temp to knees. Absent proprioception and vibration at the great toes.   Reflex Exam:  DTR's: Absent AJs otherwise deep tendon reflexes in the upper and lower extremities are normal bilaterally.  Toes: The toes are downgoing bilaterally.  Clonus: Clonus is absent.      Assessment/Plan:71 year old patient initially seen for evaluation of peripheral polyneuropathy and multiple other problems. 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 She has several risk factors including elevated HgbA1c, Hx of B12 deficiency, Rheumatoid Arthritis and Amiodarone usage.   Today we discuss her essential tremor(ET).   Tremor; Medications tried for essential tremor: metoprolol, clonazepam, topamax and lyrica.  Primidone interacts with eliquis so she can't take that.  Stop Lyrica Start Neurontin (Gabapentin) 600mg  three times a day Discuss with Dr. Theme park manager) changing Metoprolol(for afib) to Propranolol (Propranolol may be more effective in ET) [facility] for evaluation of Deep Brain Stimulation vs other options Occupational therapy for difficulty with ADLs with tremor (eating, writing) maybe  weighted utencils or other compensatory measures to help.   Peripheral neuropathy:EMG/NCS: Was normal on the bilateral lowers and left upper Shoulder and arm pain: may be overuse. Steroid taper f/u with pcp.  Sarina Ill, MD  [facility] [address] [place], [place] [address]  Phone [phone] Fax [phone]  A total of 25 minutes was spent face-to-face with this patient. Over half this time was spent on counseling patient on the essential  tremor and different diagnostic and therapeutic options available.

## 2017-02-12 NOTE — Patient Instructions (Addendum)
Overall you are doing fairly well but I do want to suggest a few things today:  emember to drink plenty of fluid, eat healthy meals and do not skip any meals. Try to eat protein with a every meal and eat a healthy snack such as fruit or nuts in between meals. Try to keep a regular sleep-wake schedule and try to exercise daily, particularly in the form of walking, 20-30 minutes a day, if you can.   As far as your medications are concerned, I would like to suggest Stop Lyrica Start Neurontin (Gabapentin) 600mg  three times a day Discuss with Dr. Luana Shu changing Metoprolol to Propranolol [facility] for evaluation of Deep Brain Stimulation vs other options  I would like to see you back after appointment at [facility], sooner if we need to. Please call us with any interim questions, concerns, problems, updates or refill requests.   Our phone number is [phone]. We also have an after hours call service for urgent matters and there is a physician on-call for urgent questions. For any emergencies you know to call 911 or go to the nearest emergency room  Gabapentin capsules or tablets What is this medicine? GABAPENTIN (GA ba pen tin) is used to control partial seizures in adults with epilepsy. It is also used to treat certain types of nerve pain. This medicine may be used for other purposes; ask your health care provider or pharmacist if you have questions. COMMON BRAND NAME(S): Active-PAC with Gabapentin, Gabarone, Neurontin What should I tell my health care provider before I take this medicine? They need to know if you have any of these conditions: -kidney disease -suicidal thoughts, plans, or attempt; a previous suicide attempt by you or a family member -an unusual or allergic reaction to gabapentin, other medicines, foods, dyes, or preservatives -pregnant or trying to get pregnant -breast-feeding How should I use this medicine? Take this medicine by mouth with a glass of water. Follow the  directions on the prescription label. You can take it with or without food. If it upsets your stomach, take it with food.Take your medicine at regular intervals. Do not take it more often than directed. Do not stop taking except on your doctor's advice. If you are directed to break the 600 or 800 mg tablets in half as part of your dose, the extra half tablet should be used for the next dose. If you have not used the extra half tablet within 28 days, it should be thrown away. A special MedGuide will be given to you by the pharmacist with each prescription and refill. Be sure to read this information carefully each time. Talk to your pediatrician regarding the use of this medicine in children. Special care may be needed. Overdosage: If you think you have taken too much of this medicine contact a poison control center or emergency room at once. NOTE: This medicine is only for you. Do not share this medicine with others. What if I miss a dose? If you miss a dose, take it as soon as you can. If it is almost time for your next dose, take only that dose. Do not take double or extra doses. What may interact with this medicine? Do not take this medicine with any of the following medications: -other gabapentin products This medicine may also interact with the following medications: -alcohol -antacids -antihistamines for allergy, cough and cold -certain medicines for anxiety or sleep -certain medicines for depression or psychotic disturbances -homatropine; hydrocodone -naproxen -narcotic medicines (opiates) for  pain -phenothiazines like chlorpromazine, mesoridazine, prochlorperazine, thioridazine This list may not describe all possible interactions. Give your health care provider a list of all the medicines, herbs, non-prescription drugs, or dietary supplements you use. Also tell them if you smoke, drink alcohol, or use illegal drugs. Some items may interact with your medicine. What should I watch for  while using this medicine? Visit your doctor or health care professional for regular checks on your progress. You may want to keep a record at home of how you feel your condition is responding to treatment. You may want to share this information with your doctor or health care professional at each visit. You should contact your doctor or health care professional if your seizures get worse or if you have any new types of seizures. Do not stop taking this medicine or any of your seizure medicines unless instructed by your doctor or health care professional. Stopping your medicine suddenly can increase your seizures or their severity. Wear a medical identification bracelet or chain if you are taking this medicine for seizures, and carry a card that lists all your medications. You may get drowsy, dizzy, or have blurred vision. Do not drive, use machinery, or do anything that needs mental alertness until you know how this medicine affects you. To reduce dizzy or fainting spells, do not sit or stand up quickly, especially if you are an older patient. Alcohol can increase drowsiness and dizziness. Avoid alcoholic drinks. Your mouth may get dry. Chewing sugarless gum or sucking hard candy, and drinking plenty of water will help. The use of this medicine may increase the chance of suicidal thoughts or actions. Pay special attention to how you are responding while on this medicine. Any worsening of mood, or thoughts of suicide or dying should be reported to your health care professional right away. Women who become pregnant while using this medicine may enroll in the [place] Pregnancy Registry by calling [phone]. This registry collects information about the safety of antiepileptic drug use during pregnancy. What side effects may I notice from receiving this medicine? Side effects that you should report to your doctor or health care professional as soon as possible: -allergic reactions  like skin rash, itching or hives, swelling of the face, lips, or tongue -worsening of mood, thoughts or actions of suicide or dying Side effects that usually do not require medical attention (report to your doctor or health care professional if they continue or are bothersome): -constipation -difficulty walking or controlling muscle movements -dizziness -nausea -slurred speech -tiredness -tremors -weight gain This list may not describe all possible side effects. Call your doctor for medical advice about side effects. You may report side effects to FDA at 1-800-FDA-1088. Where should I keep my medicine? Keep out of reach of children. This medicine may cause accidental overdose and death if it taken by other adults, children, or pets. Mix any unused medicine with a substance like cat litter or coffee grounds. Then throw the medicine away in a sealed container like a sealed bag or a coffee can with a lid. Do not use the medicine after the expiration date. Store at room temperature between 15 and 30 degrees C (59 and 86 degrees F). NOTE: This sheet is a summary. It may not cover all possible information. If you have questions about this medicine, talk to your doctor, pharmacist, or health care provider.  2017 Elsevier/Gold Standard (2014-02-11 15:26:50)

## 2017-02-13 ENCOUNTER — Encounter: Payer: Self-pay | Admitting: Neurology

## 2017-02-17 ENCOUNTER — Telehealth: Payer: Self-pay | Admitting: Neurology

## 2017-02-17 ENCOUNTER — Telehealth: Payer: Self-pay | Admitting: *Deleted

## 2017-02-17 NOTE — Telephone Encounter (Signed)
Pt said [facility] out of network for insurance.  Deep brain stimulation.  In network [facility] call patient [phone]

## 2017-02-17 NOTE — Telephone Encounter (Signed)
Does [facility] do deep brain stim?

## 2017-02-17 NOTE — Telephone Encounter (Signed)
Brandy Armstrong- this was sent to me by mistake, thanks

## 2017-02-17 NOTE — Telephone Encounter (Signed)
Brandy Armstrong, Does [facility] have deep brain stimulation for essential tremor? Dr. Carles Collet does DBS but I think that is only for Parkinson's Disease but I am going to find out for sure as I prefer Dr. Carles Collet. Let me know about [facility] thanks!

## 2017-02-17 NOTE — Telephone Encounter (Signed)
Brandy Armstrong refer her to [facility] if they do DBS for Essential Tremor (please call and ask) I don't think Dr. Carles Collet does it except for Parkinson's Disease.   Brandy Armstrong

## 2017-02-18 NOTE — Telephone Encounter (Signed)
Can you find out if she does it for essential tremor?

## 2017-02-18 NOTE — Telephone Encounter (Signed)
Called and spoke to To patient and explained process of Dr. Wells Guiles Tat at [facility] . Patient understood al Details.

## 2017-02-18 NOTE — Telephone Encounter (Signed)
I always prefer Dr. Carles Collet! Please refer to her for essential tremor and patient is interested in discussing DBS.

## 2017-02-18 NOTE — Telephone Encounter (Signed)
Dr. Jaynee Eagles, Dr. Wells Guiles Tat  Brandy Armstrong does work with DBS / Movement disorder I   can send this to Dr. Wells Guiles Tat her office takes insurance. Advise me? Thanks Brandy Armstrong.

## 2017-02-18 NOTE — Telephone Encounter (Signed)
Please refer thanks!!

## 2017-02-18 NOTE — Telephone Encounter (Signed)
Dr. Jaynee Eagles Yes for essential tremor Dr. Carles Collet . Advise me?

## 2017-02-18 NOTE — Telephone Encounter (Signed)
Called and spoke to [facility] DBS  Coordinator and they have two doctors for DBS / Movement.  1. Dr. Bedelia Person  2. Dr. Nedra Hai   Dr. Jaynee Eagles Please advise me are you ok with Patient going I will send referral and let patient no? Thanks Hinton Dyer

## 2017-02-18 NOTE — Telephone Encounter (Signed)
Referral has been sent to Dr. Carles Collet I have spoke Dr. Doristine Devoid office they have Referral. I have also called and spoke to patient with details she understood. Patient was happy to have a office local .

## 2017-02-19 ENCOUNTER — Telehealth: Payer: Self-pay | Admitting: *Deleted

## 2017-02-19 NOTE — Telephone Encounter (Signed)
Pt requested a medication refill for ondansetron  Pharmacy [facility] [address]

## 2017-02-20 NOTE — Telephone Encounter (Signed)
Ok to do so

## 2017-02-24 MED ORDER — ONDANSETRON HCL 4 MG PO TABS
4.0000 mg | ORAL_TABLET | Freq: Four times a day (QID) | ORAL | 0 refills | Status: DC | PRN
Start: 2017-02-24 — End: 2018-01-01

## 2017-02-24 NOTE — Telephone Encounter (Signed)
Patient stated that she would comply.

## 2017-02-24 NOTE — Telephone Encounter (Signed)
May refil Please advise pt that if nausea worsens or becomes more frequent she needs ov

## 2017-02-26 DIAGNOSIS — M06812 Other specified rheumatoid arthritis, left shoulder: Secondary | ICD-10-CM | POA: Diagnosis not present

## 2017-02-26 DIAGNOSIS — K219 Gastro-esophageal reflux disease without esophagitis: Secondary | ICD-10-CM | POA: Diagnosis not present

## 2017-02-26 DIAGNOSIS — Z79899 Other long term (current) drug therapy: Secondary | ICD-10-CM | POA: Diagnosis not present

## 2017-02-26 DIAGNOSIS — R42 Dizziness and giddiness: Secondary | ICD-10-CM | POA: Diagnosis not present

## 2017-02-26 DIAGNOSIS — J449 Chronic obstructive pulmonary disease, unspecified: Secondary | ICD-10-CM | POA: Diagnosis not present

## 2017-02-26 DIAGNOSIS — Z91040 Latex allergy status: Secondary | ICD-10-CM | POA: Diagnosis not present

## 2017-02-26 DIAGNOSIS — Z8719 Personal history of other diseases of the digestive system: Secondary | ICD-10-CM | POA: Diagnosis not present

## 2017-02-26 DIAGNOSIS — Z87891 Personal history of nicotine dependence: Secondary | ICD-10-CM | POA: Diagnosis not present

## 2017-02-26 DIAGNOSIS — R9431 Abnormal electrocardiogram [ECG] [EKG]: Secondary | ICD-10-CM | POA: Diagnosis not present

## 2017-02-26 DIAGNOSIS — I481 Persistent atrial fibrillation: Secondary | ICD-10-CM | POA: Diagnosis not present

## 2017-02-26 DIAGNOSIS — Z7951 Long term (current) use of inhaled steroids: Secondary | ICD-10-CM | POA: Diagnosis not present

## 2017-02-26 DIAGNOSIS — M06872 Other specified rheumatoid arthritis, left ankle and foot: Secondary | ICD-10-CM | POA: Diagnosis not present

## 2017-02-26 DIAGNOSIS — Z7952 Long term (current) use of systemic steroids: Secondary | ICD-10-CM | POA: Diagnosis not present

## 2017-02-26 DIAGNOSIS — Z7901 Long term (current) use of anticoagulants: Secondary | ICD-10-CM | POA: Diagnosis not present

## 2017-02-27 ENCOUNTER — Encounter: Payer: Self-pay | Admitting: Neurology

## 2017-03-03 ENCOUNTER — Telehealth: Payer: Self-pay | Admitting: Neurology

## 2017-03-03 DIAGNOSIS — M19072 Primary osteoarthritis, left ankle and foot: Secondary | ICD-10-CM | POA: Diagnosis not present

## 2017-03-03 DIAGNOSIS — J449 Chronic obstructive pulmonary disease, unspecified: Secondary | ICD-10-CM | POA: Diagnosis not present

## 2017-03-03 DIAGNOSIS — J392 Other diseases of pharynx: Secondary | ICD-10-CM | POA: Diagnosis not present

## 2017-03-03 DIAGNOSIS — R5383 Other fatigue: Secondary | ICD-10-CM | POA: Diagnosis not present

## 2017-03-03 DIAGNOSIS — G250 Essential tremor: Secondary | ICD-10-CM | POA: Diagnosis not present

## 2017-03-03 DIAGNOSIS — Z7951 Long term (current) use of inhaled steroids: Secondary | ICD-10-CM | POA: Diagnosis not present

## 2017-03-03 DIAGNOSIS — R0600 Dyspnea, unspecified: Secondary | ICD-10-CM | POA: Diagnosis not present

## 2017-03-03 DIAGNOSIS — R110 Nausea: Secondary | ICD-10-CM | POA: Diagnosis not present

## 2017-03-03 DIAGNOSIS — M19012 Primary osteoarthritis, left shoulder: Secondary | ICD-10-CM | POA: Diagnosis not present

## 2017-03-03 DIAGNOSIS — K219 Gastro-esophageal reflux disease without esophagitis: Secondary | ICD-10-CM | POA: Diagnosis not present

## 2017-03-03 DIAGNOSIS — Z87891 Personal history of nicotine dependence: Secondary | ICD-10-CM | POA: Diagnosis not present

## 2017-03-03 DIAGNOSIS — Z79899 Other long term (current) drug therapy: Secondary | ICD-10-CM | POA: Diagnosis not present

## 2017-03-03 DIAGNOSIS — Z792 Long term (current) use of antibiotics: Secondary | ICD-10-CM | POA: Diagnosis not present

## 2017-03-03 DIAGNOSIS — M069 Rheumatoid arthritis, unspecified: Secondary | ICD-10-CM | POA: Diagnosis not present

## 2017-03-03 DIAGNOSIS — I4891 Unspecified atrial fibrillation: Secondary | ICD-10-CM | POA: Diagnosis not present

## 2017-03-03 DIAGNOSIS — Z7901 Long term (current) use of anticoagulants: Secondary | ICD-10-CM | POA: Diagnosis not present

## 2017-03-03 NOTE — Telephone Encounter (Signed)
Brandy Armstrong, patient wants to go to [place] for deep brain stimulation. Please see below and send referral there thanks  Thanks!  I still want to be referred to [facility] as I have charity care there. They are approximately 2 months out for deep brain stimulation scheduling. I will cancel Dr Tat. They need your notes in [place] to schedule. Fax is [phone]

## 2017-03-05 NOTE — Telephone Encounter (Signed)
Referral sent to [facility] for DBS referral form from [place]. Telephone [phone] - fax [phone].

## 2017-03-14 ENCOUNTER — Telehealth: Payer: Self-pay | Admitting: Neurology

## 2017-03-14 NOTE — Telephone Encounter (Signed)
Pt called said Brandy Armstrong was up all night last night with pain from neuropathy. Brandy Armstrong is taking gabapentin (NEURONTIN) 600 MG tablet tid. Brandy Armstrong is wanting to know what Dr Jaynee Eagles will advise.

## 2017-03-17 ENCOUNTER — Ambulatory Visit: Payer: Medicare Other | Admitting: Neurology

## 2017-03-17 NOTE — Telephone Encounter (Signed)
Brandy Armstrong, if it is mostly at night she can take 2 pills of neurontin at bedtime. If this works we can increase her prescription. thanks

## 2017-03-17 NOTE — Telephone Encounter (Signed)
Called pt and advised that she try taking gabapentin 600 mg, 1 tab in the morning, 1 in the afternoon and 2 at bedtime. May call back for new rx or with any questions/concerns.

## 2017-04-08 DIAGNOSIS — Z87891 Personal history of nicotine dependence: Secondary | ICD-10-CM | POA: Diagnosis not present

## 2017-04-19 ENCOUNTER — Other Ambulatory Visit: Payer: Self-pay | Admitting: Family

## 2017-04-25 ENCOUNTER — Other Ambulatory Visit: Payer: Self-pay | Admitting: Family

## 2017-04-25 DIAGNOSIS — R251 Tremor, unspecified: Secondary | ICD-10-CM

## 2017-05-12 ENCOUNTER — Other Ambulatory Visit: Payer: Self-pay | Admitting: Family

## 2017-05-15 NOTE — Telephone Encounter (Signed)
Pt called requesting this refill. Please advise, thank you!  Pharmacy - [id number] [place], [place] - [address]

## 2017-05-19 ENCOUNTER — Telehealth: Payer: Self-pay | Admitting: Family

## 2017-05-19 NOTE — Telephone Encounter (Signed)
Ok to refill Nystatin for thrush?

## 2017-05-19 NOTE — Telephone Encounter (Signed)
Yes,   If recurrs she needs ov

## 2017-05-19 NOTE — Telephone Encounter (Signed)
Pt is requesting a liquid medication for Thrush.. Please advise pt. Pt didn't want to make an appt.[name]

## 2017-05-19 NOTE — Telephone Encounter (Signed)
Patient is requesting to have Nystatin refilled

## 2017-05-20 MED ORDER — NYSTATIN 100000 UNIT/ML MT SUSP: 5.0000 mL | Freq: Four times a day (QID) | OROMUCOSAL | 0 refills | Status: DC

## 2017-05-20 NOTE — Telephone Encounter (Signed)
Pt new number is [phone].

## 2017-05-20 NOTE — Telephone Encounter (Signed)
Pt called requesting an update. Please advise, thank you!

## 2017-05-20 NOTE — Telephone Encounter (Signed)
Pt called back to follow up on the medication. Please advise? Thank you!

## 2017-05-20 NOTE — Telephone Encounter (Signed)
Rx was phone in & pt has been notified

## 2017-05-20 NOTE — Telephone Encounter (Signed)
Please get return call number says phone disconnected or invalid.

## 2017-07-03 ENCOUNTER — Other Ambulatory Visit: Payer: Self-pay | Admitting: Family

## 2017-07-10 NOTE — Telephone Encounter (Signed)
Patient has requested a update on this medication She would like to have this refilled  Pt contact [phone]

## 2017-07-11 MED ORDER — PREDNISONE 10 MG TABLET
ORAL_TABLET | 1 refills | 0 days | Status: CP
Start: 2017-07-11 — End: 2018-06-22

## 2017-07-12 IMAGING — DX DG LUMBAR SPINE COMPLETE 4+V
5 series · 5 of 5 positions shown · non-contrast
Comparison: CT 01/20/2011.

CLINICAL DATA: Bilateral back pain.  No sciatica.

EXAM:
LUMBAR SPINE - COMPLETE 4+ VIEW

[lumbar spine ap]
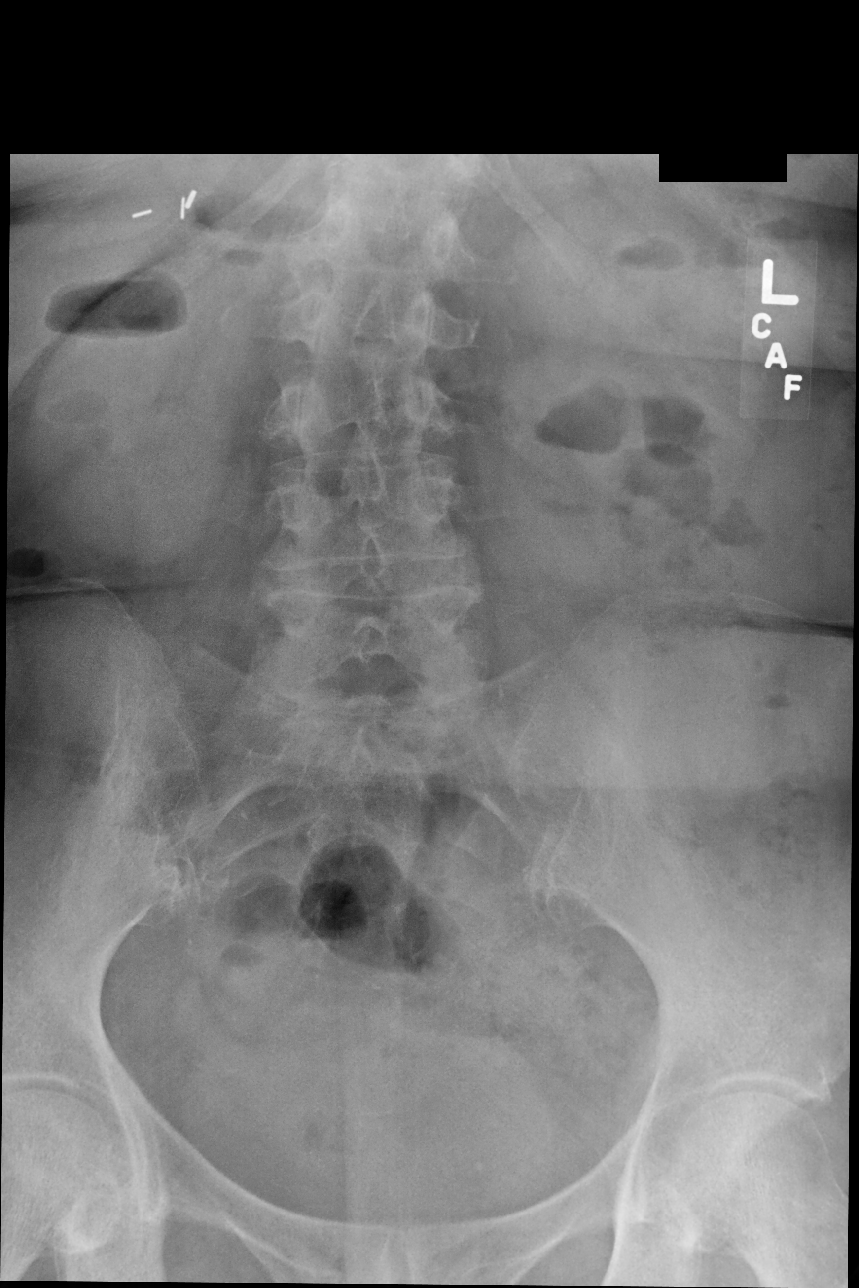

[lumbar spine obl (oblique) (1 of 2)]
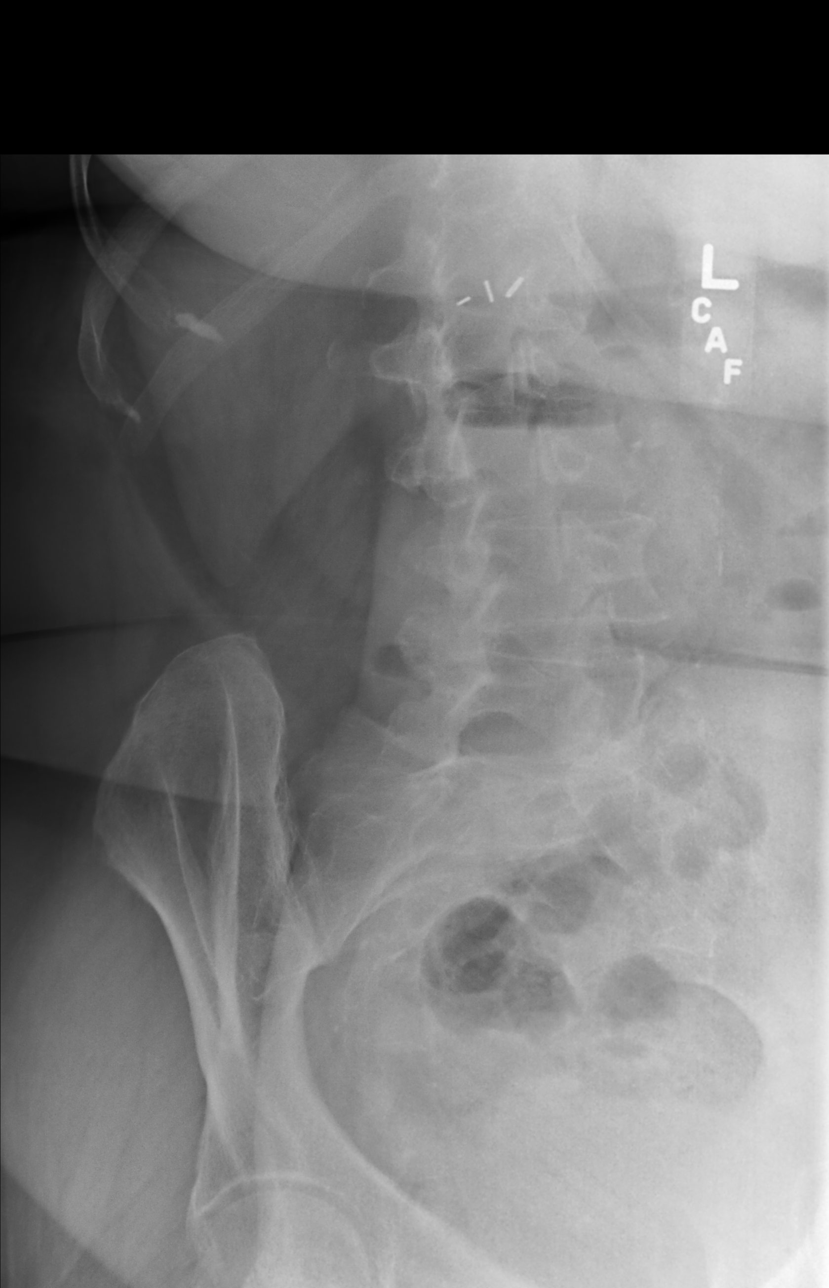

[lumbar spine obl (oblique) (2 of 2)]
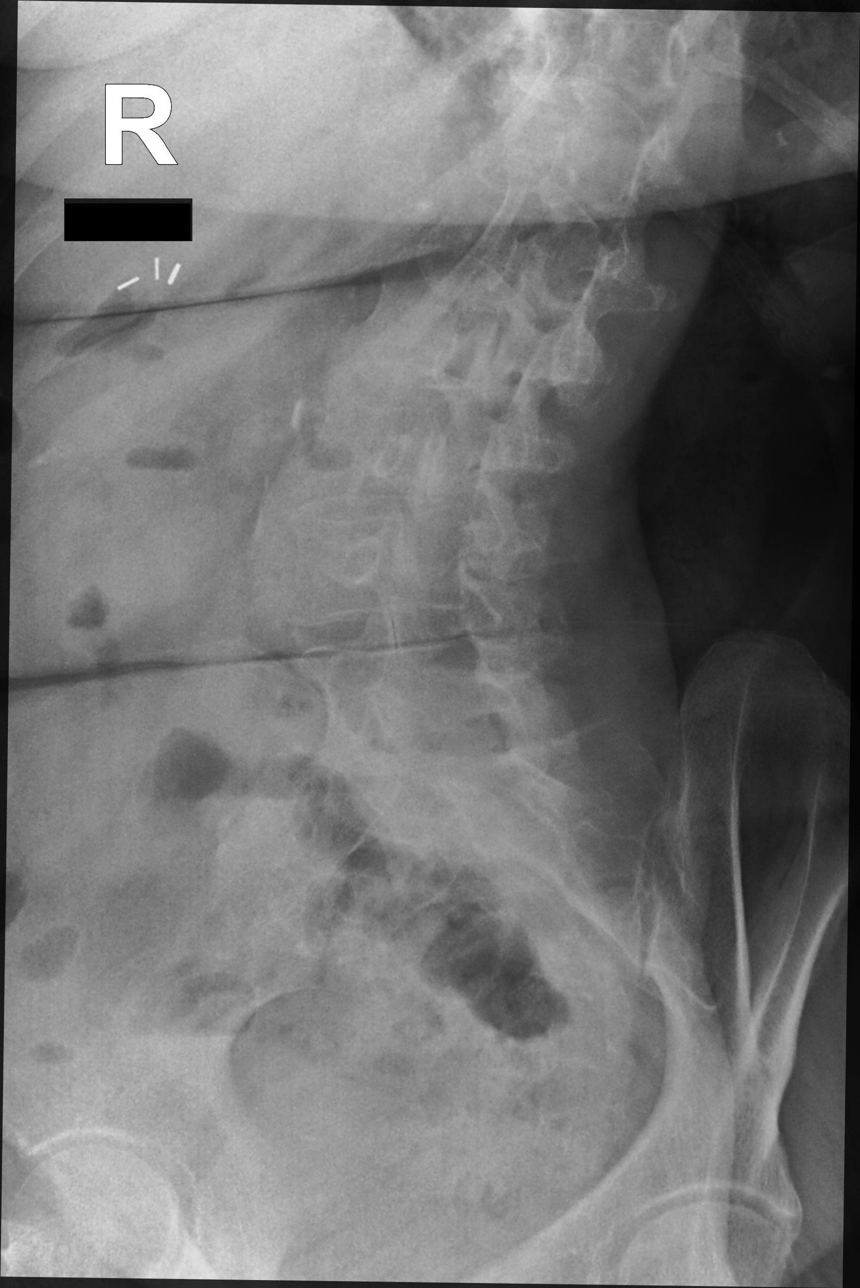

[lumbar spine lat]
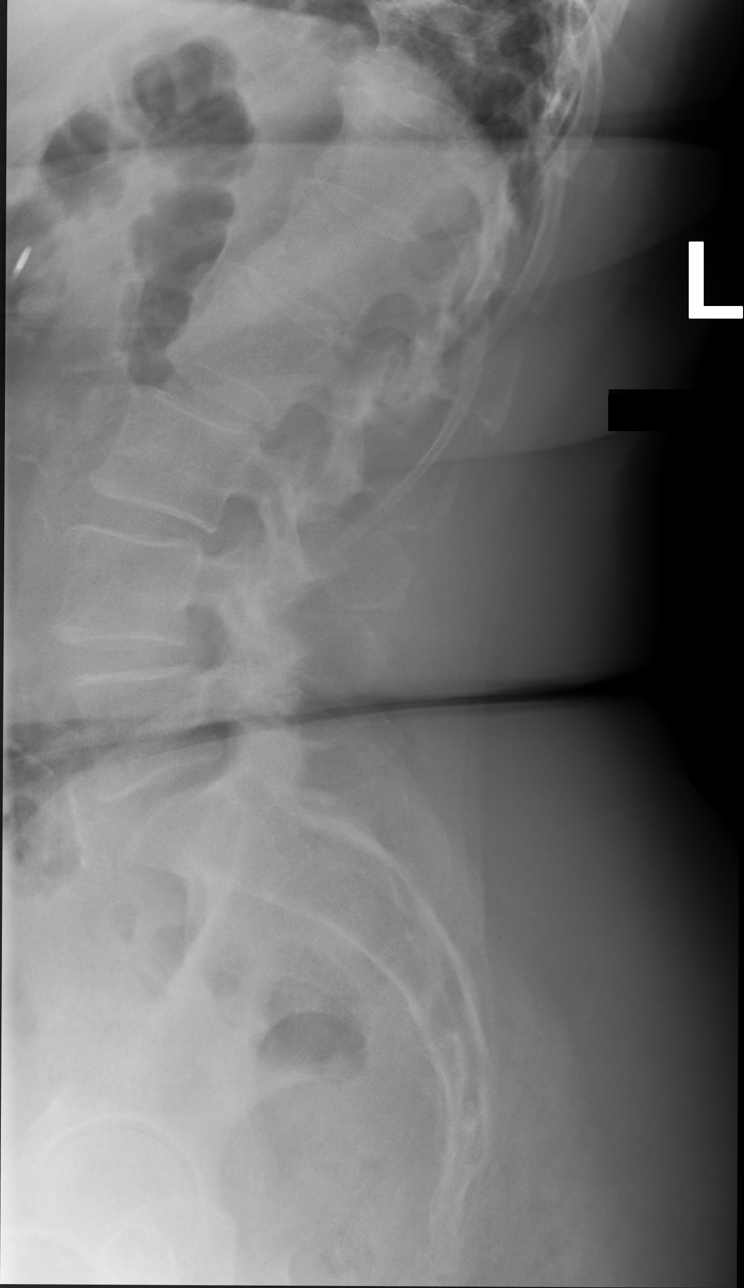

[lumbar spot lat]
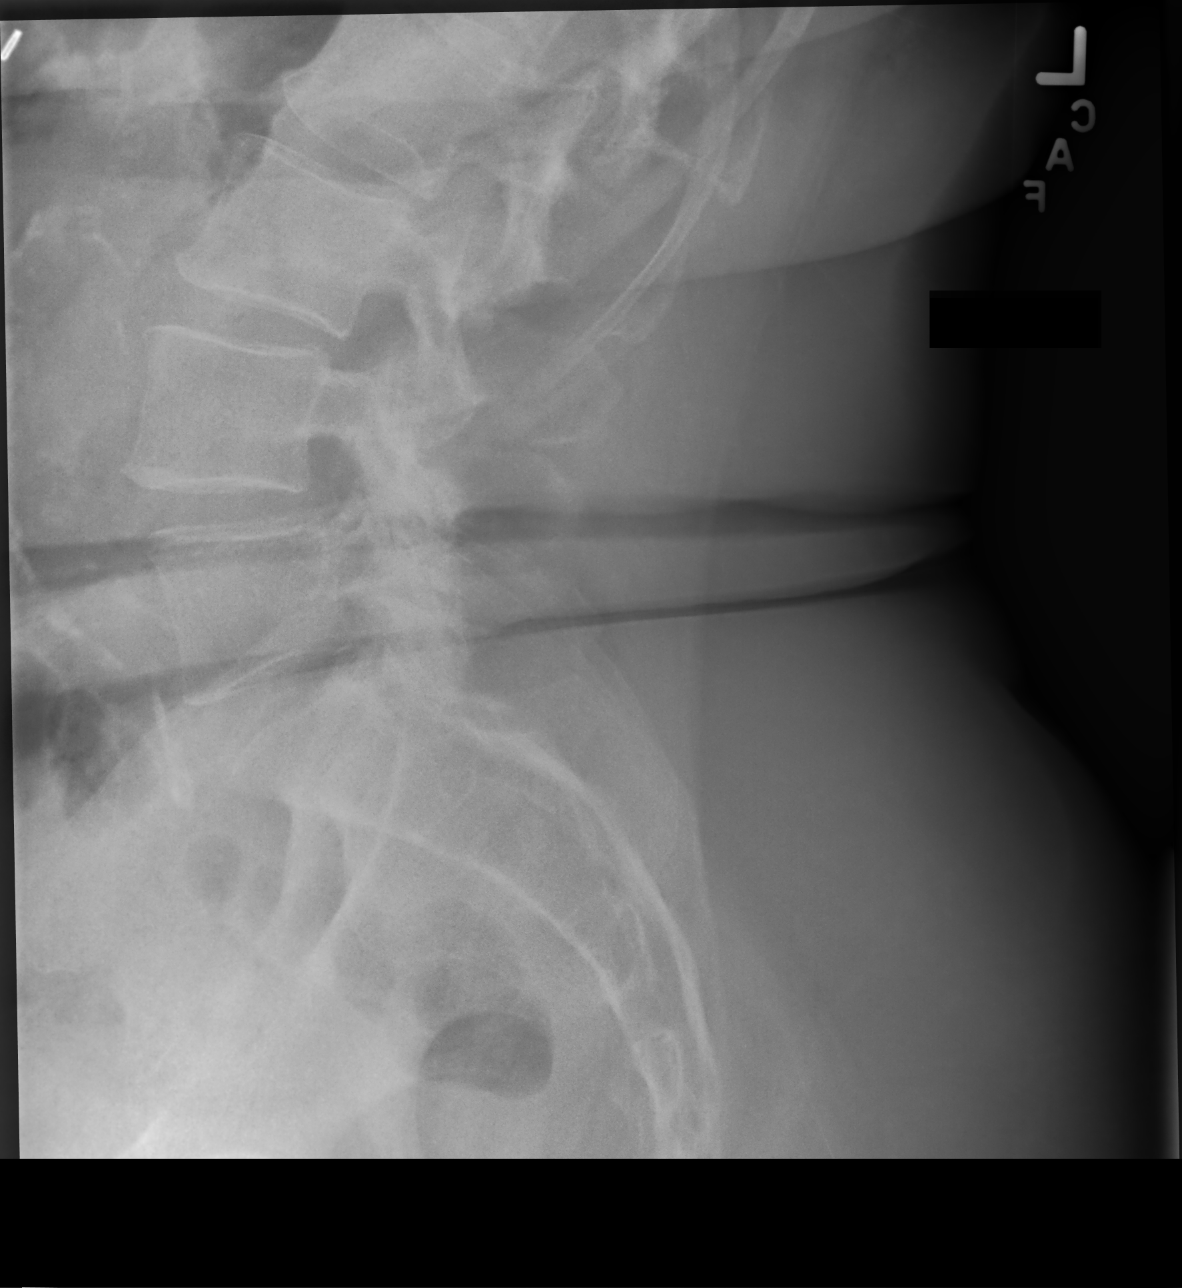

[5 of 5 positions shown; findings below may reference images not displayed]

FINDINGS: Mild scoliosis concave left. Degenerative changes lumbar spine and
both hips. 3 mm anterolisthesis L4 on L5. 3 mm retrolisthesis L1 on
L2. Aortoiliac atherosclerotic vascular disease.
IMPRESSION: 1. Diffuse multilevel degenerative change with scoliosis concave
left. No evidence of fracture.

2.  3 mm anterolisthesis L4 on L5.  3 mm retrolisthesis L1 on L2.

3.  Aortoiliac atherosclerotic vascular disease.

## 2017-07-28 MED ORDER — PROPRANOLOL ER 80 MG CAPSULE,24 HR,EXTENDED RELEASE
ORAL_CAPSULE | Freq: Two times a day (BID) | ORAL | 7 refills | 0.00000 days | Status: CP
Start: 2017-07-28 — End: 2018-02-25

## 2017-08-01 ENCOUNTER — Ambulatory Visit: Payer: Medicare Other

## 2017-08-06 ENCOUNTER — Ambulatory Visit (INDEPENDENT_AMBULATORY_CARE_PROVIDER_SITE_OTHER): Payer: Medicare Other | Admitting: Family Medicine

## 2017-08-06 ENCOUNTER — Encounter: Payer: Self-pay | Admitting: Family Medicine

## 2017-08-06 VITALS — BP 100/80 | HR 71 | Temp 98.7°F | Resp 12 | Wt 215.4 lb

## 2017-08-06 DIAGNOSIS — D692 Other nonthrombocytopenic purpura: Secondary | ICD-10-CM

## 2017-08-06 LAB — CBC
HCT: 34.3 % — ABNORMAL LOW (ref 36.0–46.0)
HEMOGLOBIN: 10.8 g/dL — AB (ref 12.0–15.0)
MCHC: 31.6 g/dL (ref 30.0–36.0)
MCV: 82.3 fl (ref 78.0–100.0)
PLATELETS: 279 10*3/uL (ref 150.0–400.0)
RBC: 4.17 Mil/uL (ref 3.87–5.11)
RDW: 19 % — AB (ref 11.5–15.5)
WBC: 6.2 10*3/uL (ref 4.0–10.5)

## 2017-08-06 LAB — COMPREHENSIVE METABOLIC PANEL
ALT: 10 U/L (ref 0–35)
AST: 22 U/L (ref 0–37)
Albumin: 3.7 g/dL (ref 3.5–5.2)
Alkaline Phosphatase: 98 U/L (ref 39–117)
BILIRUBIN TOTAL: 0.5 mg/dL (ref 0.2–1.2)
BUN: 12 mg/dL (ref 6–23)
CALCIUM: 9.3 mg/dL (ref 8.4–10.5)
CHLORIDE: 100 meq/L (ref 96–112)
CO2: 31 meq/L (ref 19–32)
Creatinine, Ser: 1.06 mg/dL (ref 0.40–1.20)
GFR: 54.63 mL/min — AB (ref >60.00)
Glucose, Bld: 114 mg/dL — ABNORMAL HIGH (ref 70–99)
Potassium: 4.5 mEq/L (ref 3.5–5.1)
Sodium: 136 mEq/L (ref 135–145)
Total Protein: 7 g/dL (ref 6.0–8.3)

## 2017-08-06 LAB — SEDIMENTATION RATE: Sed Rate: 25 mm/hr (ref 0–30)

## 2017-08-06 NOTE — Progress Notes (Signed)
  Tommi Rumps, MD Phone: [phone]  Brandy Armstrong is a 71 y.o. female who presents today for same-day visit.  Patient notes rash developed on her lower legs over the last week or so. Notes she has developed a few more lesions since the onset though nothing significant. Lesions are not raised. There is no itching. There is no pain. She not had any fevers. She did start on xeljanz recently. She feels at her baseline. Does report her legs have ached to some degree over the last month bilaterally. No swelling. She does take Eliquis.  ROS see history of present illness  Objective  Physical Exam Vitals:   08/06/17 1315  BP: 100/80  Pulse: 71  Resp: 12  Temp: 98.7 F (37.1 C)    BP Readings from Last 3 Encounters:  08/06/17 100/80  02/12/17 99/68  01/28/17 112/76   Wt Readings from Last 3 Encounters:  08/06/17 215 lb 6 oz (97.7 kg)  02/12/17 211 lb (95.7 kg)  10/28/16 205 lb 9.6 oz (93.3 kg)    Physical Exam  Constitutional: No distress.  Cardiovascular: Normal rate, regular rhythm and normal heart sounds.   2+ radial and DP pulses with no tenderness  Pulmonary/Chest: Effort normal and breath sounds normal.  Musculoskeletal: She exhibits no edema.  Neurological: She is alert. Gait normal.  Skin: Skin is warm and dry. She is not diaphoretic.  Scattered non-palpable purpura in bilateral lower legs, non-tender, no erythema, no lesions noted on arms or trunk, no splinter hemorrhages     Assessment/Plan: Please see individual problem list.  Purpura (HCC) Lesions on lower legs consistent with purpura. They're nontender. She has intact pulses in her DP and radial pulses. Potentially could be vasculitic in nature. No other skin findings consistent with purpura. No splinter hemorrhages. No heart murmur. We'll check labs as outlined below. We'll refer to dermatology. She's given return precautions.   Orders Placed This Encounter  Procedures  . CBC  . Comp Met (CMET)  . Sed  Rate (ESR)  . Ambulatory referral to Dermatology    Referral Priority:   Routine    Referral Type:   Consultation    Referral Reason:   Specialty Services Required    Requested Specialty:   Dermatology    Number of Visits Requested:   1    Tommi Rumps, MD [place]

## 2017-08-06 NOTE — Assessment & Plan Note (Signed)
Lesions on lower legs consistent with purpura. They're nontender. She has intact pulses in her DP and radial pulses. Potentially could be vasculitic in nature. No other skin findings consistent with purpura. No splinter hemorrhages. No heart murmur. We'll check labs as outlined below. We'll refer to dermatology. She's given return precautions.

## 2017-08-06 NOTE — Patient Instructions (Signed)
Nice to see you. With some lab work today and contact you with the results. If you develop fevers, spreading rash, or you start to feel poorly or develop any new symptoms please seek medical attention.

## 2017-08-07 ENCOUNTER — Other Ambulatory Visit: Payer: Self-pay | Admitting: Family Medicine

## 2017-08-07 DIAGNOSIS — N179 Acute kidney failure, unspecified: Secondary | ICD-10-CM

## 2017-08-15 ENCOUNTER — Emergency Department
Admission: EM | Admit: 2017-08-15 | Discharge: 2017-08-15 | Disposition: A | Discharge status: Home / Self Care | Payer: Medicare Other | Attending: Emergency Medicine | Admitting: Emergency Medicine

## 2017-08-15 ENCOUNTER — Encounter: Payer: Self-pay | Admitting: Emergency Medicine

## 2017-08-15 DIAGNOSIS — Z23 Encounter for immunization: Secondary | ICD-10-CM | POA: Insufficient documentation

## 2017-08-15 DIAGNOSIS — Z79899 Other long term (current) drug therapy: Secondary | ICD-10-CM | POA: Insufficient documentation

## 2017-08-15 DIAGNOSIS — Y929 Unspecified place or not applicable: Secondary | ICD-10-CM | POA: Diagnosis not present

## 2017-08-15 DIAGNOSIS — Z87891 Personal history of nicotine dependence: Secondary | ICD-10-CM | POA: Insufficient documentation

## 2017-08-15 DIAGNOSIS — Y999 Unspecified external cause status: Secondary | ICD-10-CM | POA: Insufficient documentation

## 2017-08-15 DIAGNOSIS — W2209XA Striking against other stationary object, initial encounter: Secondary | ICD-10-CM | POA: Insufficient documentation

## 2017-08-15 DIAGNOSIS — Y939 Activity, unspecified: Secondary | ICD-10-CM | POA: Diagnosis not present

## 2017-08-15 DIAGNOSIS — J449 Chronic obstructive pulmonary disease, unspecified: Secondary | ICD-10-CM | POA: Diagnosis not present

## 2017-08-15 DIAGNOSIS — S51019A Laceration without foreign body of unspecified elbow, initial encounter: Secondary | ICD-10-CM

## 2017-08-15 DIAGNOSIS — S51012A Laceration without foreign body of left elbow, initial encounter: Secondary | ICD-10-CM | POA: Insufficient documentation

## 2017-08-15 MED ORDER — TETANUS-DIPHTH-ACELL PERTUSSIS 5-2.5-18.5 LF-MCG/0.5 IM SUSP
0.5000 mL | Freq: Once | INTRAMUSCULAR | Status: AC
  Administered 2017-08-15: 0.5 mL via INTRAMUSCULAR
  Filled 2017-08-15: qty 0.5

## 2017-08-15 NOTE — Discharge Instructions (Signed)
Keep the wound clean, dry, and covered with antibiotic ointment and a dressing. Wash only with wound soap and water, as needed. Follow-up with your provider as needed.

## 2017-08-15 NOTE — ED Triage Notes (Signed)
Pt ambulatory to triage with steady gait, no distress noted. Pt reports hitting left elbow on a [employer], leaving a skin tear. Pt to ED for evaluation of sutures and tetanus shot.

## 2017-08-15 NOTE — ED Notes (Signed)
Reviewed d/c instructions, follow-up care, wound care, and dressing changes with patient. Pt verbalized understanding

## 2017-08-15 NOTE — ED Notes (Signed)
Pt with skin tear noted to left upper lateral forearm near elbow. Dressing in place, bleeding controlled. Pt states she brushed up against a car mirror getting out of vehicle, lacerating left arm. Pt ambulatory without distress, pt able to move all fingers.

## 2017-08-16 NOTE — ED Provider Notes (Signed)
[facility] Emergency Department Provider Note ____________________________________________  Time seen: 2139   I have reviewed the triage vital signs and the nursing notes.  HISTORY  Chief Complaint  Laceration  HPI Brandy Armstrong is a 71 y.o. female Presents to the ED for evaluation of a skin tear to the left elbow. Patient describes that she hit her elbow on a [employer] and accidentally sustained a superficial skin laceration. She presents to the ED for wound care and a tetanus booster. She denies any other injury at this time.  Past Medical History:  Diagnosis Date  . Atrial fibrillation [facility])    a. s/p ablation 01/2012 in [facility] by Dr. Boyd Kerbs;  b. On sotalol & Xarelto;  c. 02/2014 Echo: EF 50-55%, mild conc LVH, nl LA size/structure;  d. Recurrent afib 8/15 & 08/29/2014.  . Barretts esophagus   . Chest pain    a. 02/2014 Myoview: Ef 50%, no ischemia.  . Colon polyps   . COPD (chronic obstructive pulmonary disease) ([place])   . COPD (chronic obstructive pulmonary disease) ([place])   . Depression with anxiety   . GERD (gastroesophageal reflux disease)   . Neuropathy   . Rectal fistula   . Rheumatoid arthritis ([place])    On methotrexate and orencia  . Urinary incontinence   . Vitamin D deficiency     Patient Active Problem List   Diagnosis Date Noted  . Purpura ([place]) 08/06/2017  . Radicular pain in left arm 09/30/2016  . Cellulitis 09/26/2016  . Abscess 09/24/2016  . Atherosclerosis of aorta ([place]) 08/13/2016  . Anemia 08/13/2016  . Urinary urgency 08/06/2016  . Chronic back pain 05/20/2016  . Neuropathy 10/05/2015  . Tremors of nervous system 06/18/2015  . GI bleed due to NSAIDs 05/04/2015  . Atrial fibrillation ([place]) 03/11/2014  . COPD (chronic obstructive pulmonary disease) ([place]) 03/11/2014  . Fatigue 08/18/2013  . Barrett's esophagus 08/18/2013  . Chronic diarrhea 08/18/2013  . Numbness and tingling 08/18/2013    Past Surgical History:   Procedure Laterality Date  . ABDOMINAL HYSTERECTOMY  1992  . CARDIAC ELECTROPHYSIOLOGY STUDY AND ABLATION  2013  . CHOLECYSTECTOMY  1987  . COLONOSCOPY N/A 05/08/2015   Procedure: COLONOSCOPY;  Surgeon: Manya Silvas, MD;  Location: [facility] ENDOSCOPY;  Service: Endoscopy;  Laterality: N/A;  . ESOPHAGOGASTRODUODENOSCOPY N/A 05/06/2015   Procedure: ESOPHAGOGASTRODUODENOSCOPY (EGD);  Surgeon: Lollie Sails, MD;  Location: [facility] ENDOSCOPY;  Service: Endoscopy;  Laterality: N/A;  . oophrectomy Bilateral 1992    Prior to Admission medications   Medication Sig Start Date End Date Taking? Authorizing Provider  budesonide-formoterol (SYMBICORT) 160-4.5 MCG/ACT inhaler INHALE 2 PUFFS TWO (2) TIMES A DAY. 11/05/16   Arnett, Yvetta Coder, FNP  ELIQUIS 5 MG TABS tablet TAKE 1 TABLET (5 MG TOTAL) BY MOUTH TWO (2) TIMES A DAY. 06/28/16   [provider]  escitalopram (LEXAPRO) 10 MG tablet TAKE 1 TABLET (10 MG TOTAL) BY MOUTH DAILY. 07/10/17   [name], FNP  esomeprazole (NEXIUM) 40 MG capsule TAKE ONE CAPSULE BY MOUTH DAILY AT 12 NOON 05/15/17   [name], FNP  gabapentin (NEURONTIN) 600 MG tablet Take 1 tablet (600 mg total) by mouth 3 (three) times daily. 02/12/17   Melvenia Beam, MD  hydroxychloroquine (PLAQUENIL) 200 MG tablet Take 200 mg by mouth. 06/21/16   [provider]  leflunomide (ARAVA) 20 MG tablet Take 20 mg by mouth daily. 01/09/15   [provider]  lidocaine (XYLOCAINE) 5 %  ointment Apply 1 application topically as needed.    [provider]  mupirocin ointment (BACTROBAN) 2 % Place 1 application into the nose 2 (two) times daily. 09/24/16   [name], FNP  nystatin (MYCOSTATIN) 100000 UNIT/ML suspension Use as directed 5 mLs (500,000 Units total) in the mouth or throat 4 (four) times daily. Swish, gargle, & spit 05/20/17   [name], FNP  ondansetron (ZOFRAN) 4 MG tablet Take 1 tablet (4 mg total) by mouth every 6 (six)  hours as needed for nausea. 02/24/17   [name], FNP  propranolol ER (INDERAL LA) 80 MG 24 hr capsule Take 80 mg by mouth. 07/28/17 07/28/18  [provider]  SPIRIVA RESPIMAT 2.5 MCG/ACT AERS INHALE 2 PUFFS BY MOUTH DAILY. 06/26/16   [provider]  Tofacitinib Citrate 5 MG TABS Take 5 mg by mouth. 03/03/17   [provider]  Morrie Sheldon 5 MG TABS  07/31/17   [provider]    Allergies Latex  Family History  Problem Relation Age of Onset  . Depression Mother   . Cancer Mother 54       pancreatic cancer  . Cancer Father 45       colon cancer  . Cancer Sister 30       breast cancer  . Breast cancer Sister 14  . Diabetes Brother   . Neuropathy Neg Hx     Social History Social History  Substance Use Topics  . Smoking status: Former Smoker    Packs/day: 1.00    Years: 40.00    Quit date: 12/16/2011  . Smokeless tobacco: Never Used  . Alcohol use 0.0 oz/week     Comment: Occasionally has a drink    Review of Systems  Constitutional: Negative for fever. Cardiovascular: Negative for chest pain. Respiratory: Negative for shortness of breath. Musculoskeletal: Negative for back pain. Skin: Negative for rash. Left elbow skin tear Neurological: Negative for headaches, focal weakness or numbness. ____________________________________________  PHYSICAL EXAM:  VITAL SIGNS: ED Triage Vitals [08/15/17 2050]  Enc Vitals Group     BP (!) 141/78     Pulse Rate 97     Resp 18     Temp 97.6 F (36.4 C)     Temp Source Oral     SpO2 97 %     Weight 215 lb (97.5 kg)     Height      Head Circumference      Peak Flow      Pain Score      Pain Loc      Pain Edu?      Excl. in [place]?     Constitutional: Alert and oriented. Well appearing and in no distress. Head: Normocephalic and atraumatic. Cardiovascular: Normal rate, regular rhythm. Normal distal pulses. Respiratory: Normal respiratory effort.  Musculoskeletal: Nontender with normal  range of motion in all extremities.  Neurologic:  Normal gait without ataxia. Normal speech and language. No gross focal neurologic deficits are appreciated. Skin:  Skin is warm, dry and intact. No rash noted. Left elbow with superficial skin tear measuring approximately 4 cm in diameter. Local ecchymosis is noted to the lateral elbow. ____________________________________________  PROCEDURES  Tdap 0.5 ml IM Wound cleansed with soap & water Vaseline gauze applied Telfa applied Tube gauze applied ____________________________________________  INITIAL IMPRESSION / ASSESSMENT AND PLAN / ED COURSE  Patient was ED evaluation of a superficial skin tear to the left elbow. The wound is cleansed and dressed  with nonstick sterile gauze dressing. Patient given wound care instructions and will follow-up with primary care provider as needed. ____________________________________________  FINAL CLINICAL IMPRESSION(S) / ED DIAGNOSES  Final diagnoses:  Skin tear of elbow without complication, initial encounter      Melvenia Needles, PA-C 08/16/17 0020    Nena Polio, MD 08/17/17 [phone]

## 2017-08-18 ENCOUNTER — Other Ambulatory Visit: Payer: Self-pay

## 2017-08-18 ENCOUNTER — Ambulatory Visit
Admission: RE | Admit: 2017-08-18 | Discharge: 2017-08-18 | Disposition: A | Discharge status: Home / Self Care | Payer: MEDICARE | Attending: Rheumatology | Admitting: Rheumatology

## 2017-08-18 DIAGNOSIS — M0579 Rheumatoid arthritis with rheumatoid factor of multiple sites without organ or systems involvement: Principal | ICD-10-CM

## 2017-08-18 DIAGNOSIS — I481 Persistent atrial fibrillation: Secondary | ICD-10-CM | POA: Diagnosis not present

## 2017-08-18 DIAGNOSIS — M19019 Primary osteoarthritis, unspecified shoulder: Secondary | ICD-10-CM | POA: Diagnosis not present

## 2017-08-18 DIAGNOSIS — M0589 Other rheumatoid arthritis with rheumatoid factor of multiple sites: Secondary | ICD-10-CM | POA: Diagnosis not present

## 2017-08-18 DIAGNOSIS — J449 Chronic obstructive pulmonary disease, unspecified: Secondary | ICD-10-CM | POA: Diagnosis not present

## 2017-08-18 DIAGNOSIS — Z7951 Long term (current) use of inhaled steroids: Secondary | ICD-10-CM | POA: Diagnosis not present

## 2017-08-18 DIAGNOSIS — R21 Rash and other nonspecific skin eruption: Secondary | ICD-10-CM | POA: Diagnosis not present

## 2017-08-18 DIAGNOSIS — Z7952 Long term (current) use of systemic steroids: Secondary | ICD-10-CM | POA: Diagnosis not present

## 2017-08-18 DIAGNOSIS — Z87891 Personal history of nicotine dependence: Secondary | ICD-10-CM | POA: Diagnosis not present

## 2017-08-18 DIAGNOSIS — Z79899 Other long term (current) drug therapy: Secondary | ICD-10-CM | POA: Diagnosis not present

## 2017-08-18 DIAGNOSIS — Z7901 Long term (current) use of anticoagulants: Secondary | ICD-10-CM | POA: Diagnosis not present

## 2017-08-18 MED ORDER — DICLOFENAC 1 % TOPICAL GEL
Freq: Four times a day (QID) | TOPICAL | 0 refills | 0 days | Status: CP
Start: 2017-08-18 — End: 2018-08-18

## 2017-08-27 ENCOUNTER — Other Ambulatory Visit: Payer: Self-pay | Admitting: Neurology

## 2017-08-27 ENCOUNTER — Ambulatory Visit: Admission: RE | Admit: 2017-08-27 | Discharge: 2017-08-27 | Payer: MEDICARE

## 2017-08-27 DIAGNOSIS — D485 Neoplasm of uncertain behavior of skin: Secondary | ICD-10-CM

## 2017-08-27 DIAGNOSIS — R233 Spontaneous ecchymoses: Principal | ICD-10-CM

## 2017-08-27 DIAGNOSIS — B351 Tinea unguium: Secondary | ICD-10-CM

## 2017-08-27 DIAGNOSIS — L958 Other vasculitis limited to the skin: Secondary | ICD-10-CM | POA: Diagnosis not present

## 2017-08-27 DIAGNOSIS — C44519 Basal cell carcinoma of skin of other part of trunk: Secondary | ICD-10-CM | POA: Diagnosis not present

## 2017-08-27 DIAGNOSIS — G250 Essential tremor: Secondary | ICD-10-CM

## 2017-08-27 MED ORDER — CICLOPIROX 8 % TOPICAL SOLUTION
Freq: Every evening | TOPICAL | 5 refills | 0 days | Status: CP
Start: 2017-08-27 — End: ?

## 2017-10-02 DIAGNOSIS — M310 Hypersensitivity angiitis: Secondary | ICD-10-CM | POA: Diagnosis not present

## 2017-10-03 MED ORDER — TIOTROPIUM BROMIDE 2.5 MCG/ACTUATION MIST FOR INHALATION
Freq: Every day | RESPIRATORY_TRACT | 3 refills | 0 days | Status: CP
Start: 2017-10-03 — End: ?

## 2017-10-05 ENCOUNTER — Other Ambulatory Visit: Payer: Self-pay | Admitting: Neurology

## 2017-10-05 DIAGNOSIS — G250 Essential tremor: Secondary | ICD-10-CM

## 2017-10-08 ENCOUNTER — Telehealth: Payer: Self-pay | Admitting: *Deleted

## 2017-10-08 NOTE — Telephone Encounter (Signed)
Patient requested a medication refill for Brandy Armstrong

## 2017-10-08 NOTE — Telephone Encounter (Signed)
Left message to return call back. Patient needs to schedule appointment to have refill of medication.

## 2017-10-09 ENCOUNTER — Other Ambulatory Visit: Payer: Self-pay | Admitting: Family

## 2017-10-09 DIAGNOSIS — M545 Low back pain, unspecified: Secondary | ICD-10-CM

## 2017-10-09 NOTE — Telephone Encounter (Signed)
Left message to return call back. Patient needs to schedule appointment to have refill of medication.

## 2017-10-14 ENCOUNTER — Ambulatory Visit: Admission: RE | Admit: 2017-10-14 | Discharge: 2017-10-14 | Payer: MEDICARE

## 2017-10-14 DIAGNOSIS — C4491 Basal cell carcinoma of skin, unspecified: Principal | ICD-10-CM

## 2017-10-14 DIAGNOSIS — C44519 Basal cell carcinoma of skin of other part of trunk: Secondary | ICD-10-CM | POA: Diagnosis not present

## 2017-10-15 ENCOUNTER — Ambulatory Visit: Payer: Medicare Other

## 2017-10-17 ENCOUNTER — Telehealth: Payer: Self-pay | Admitting: Family

## 2017-10-17 NOTE — Telephone Encounter (Signed)
Ok to refill due to her reaction to her symbicort?

## 2017-10-17 NOTE — Telephone Encounter (Signed)
Pt called about needing a refill for nystatin (MYCOSTATIN) 100000 UNIT/ML suspension liquid.   Ph[facility]acy is [id number] [place], [place] - [address]  Call pt @ [phone]. Thank you!

## 2017-10-21 MED ORDER — NYSTATIN 100000 UNIT/ML MT SUSP: 5.0000 mL | Freq: Four times a day (QID) | OROMUCOSAL | 0 refills | Status: DC

## 2017-10-21 NOTE — Telephone Encounter (Signed)
May refill.  Please counsel patient on rinsing mouth out after symbicort  If symptoms of thrush are present or present, please advise ov

## 2017-10-21 NOTE — Telephone Encounter (Signed)
Patient has been made aware.

## 2017-10-30 ENCOUNTER — Ambulatory Visit
Admission: RE | Admit: 2017-10-30 | Discharge: 2017-10-30 | Payer: MEDICARE | Attending: Dermatology | Admitting: Dermatology

## 2017-10-30 DIAGNOSIS — C4491 Basal cell carcinoma of skin, unspecified: Principal | ICD-10-CM

## 2017-11-04 ENCOUNTER — Ambulatory Visit (INDEPENDENT_AMBULATORY_CARE_PROVIDER_SITE_OTHER): Payer: Medicare Other | Admitting: Pulmonary Disease

## 2017-11-04 ENCOUNTER — Encounter: Payer: Self-pay | Admitting: Pulmonary Disease

## 2017-11-04 VITALS — BP 110/80 | HR 96 | Ht 66.5 in | Wt 224.0 lb

## 2017-11-04 DIAGNOSIS — R5381 Other malaise: Secondary | ICD-10-CM

## 2017-11-04 DIAGNOSIS — E668 Other obesity: Secondary | ICD-10-CM

## 2017-11-04 DIAGNOSIS — I482 Chronic atrial fibrillation, unspecified: Secondary | ICD-10-CM

## 2017-11-04 DIAGNOSIS — J449 Chronic obstructive pulmonary disease, unspecified: Secondary | ICD-10-CM | POA: Diagnosis not present

## 2017-11-04 DIAGNOSIS — R0609 Other forms of dyspnea: Secondary | ICD-10-CM | POA: Diagnosis not present

## 2017-11-04 NOTE — Patient Instructions (Signed)
Continue Symbicort and Spiriva inhalers Continue albuterol inhaler as needed I have made referral to pulmonary rehabilitation program Follow-up in 6-8 weeks with lung function tests and chest x-ray prior to that visit

## 2017-11-06 MED ORDER — TOFACITINIB 5 MG TABLET
ORAL_TABLET | Freq: Two times a day (BID) | ORAL | 6 refills | 0 days | Status: CP
Start: 2017-11-06 — End: 2018-06-08

## 2017-11-06 NOTE — Progress Notes (Signed)
PULMONARY CONSULT NOTE  Requesting MD/Service: Self Date of initial consultation: 11/04/17 Reason for consultation: COPD  PT PROFILE: 71 y.o. female former smoker (1 PPD X 45 yrs, quit 2012) previously seen by Dr Brandy Armstrong for COPD  DATA:   HPI:  As above.  She was initially diagnosed with COPD by Dr. Curt Armstrong approximately 6 or 7 years ago.  She is presently maintained on Spiriva, Symbicort, albuterol as needed.  She uses the albuterol 1-2 times per day on average.  She reports moderate to severe exertional dyspnea with little day-to-day variation.  It tends to be worse in hot humid weather.  She denies chest pain, hemoptysis, lower extremity edema, calf tenderness, PND, orthopnea.  She does note recent weight gain.  Past Medical History:  Diagnosis Date  . Atrial fibrillation [facility])    a. s/p ablation 01/2012 in [facility] by Dr. Boyd Armstrong;  b. On sotalol & Xarelto;  c. 02/2014 Echo: EF 50-55%, mild conc LVH, nl LA size/structure;  d. Recurrent afib 8/15 & 08/29/2014.  . Barretts esophagus   . Chest pain    a. 02/2014 Myoview: Ef 50%, no ischemia.  . Colon polyps   . COPD (chronic obstructive pulmonary disease) ([place])   . COPD (chronic obstructive pulmonary disease) ([place])   . Depression with anxiety   . GERD (gastroesophageal reflux disease)   . Neuropathy   . Rectal fistula   . Rheumatoid arthritis ([place])    On methotrexate and orencia  . Urinary incontinence   . Vitamin D deficiency     Past Surgical History:  Procedure Laterality Date  . ABDOMINAL HYSTERECTOMY  1992  . CARDIAC ELECTROPHYSIOLOGY STUDY AND ABLATION  2013  . CHOLECYSTECTOMY  1987  . oophrectomy Bilateral 1992    MEDICATIONS: I have reviewed all medications and confirmed regimen as documented  Social History   Socioeconomic History  . Marital status: Single    Spouse name: Not on file  . Number of children: 0  . Years of education: 6  . Highest education level: Not on file  Social Needs  . Financial  resource strain: Not on file  . Food insecurity - worry: Not on file  . Food insecurity - inability: Not on file  . Transportation needs - medical: Not on file  . Transportation needs - non-medical: Not on file  Occupational History  . Occupation: Retired  Tobacco Use  . Smoking status: Former Smoker    Packs/day: 1.00    Years: 40.00    Pack years: 40.00    Last attempt to quit: 12/16/2011    Years since quitting: 5.8  . Smokeless tobacco: Never Used  Substance and Sexual Activity  . Alcohol use: Yes    Alcohol/week: 0.0 oz    Comment: Occasionally has a drink  . Drug use: No  . Sexual activity: No  Other Topics Concern  . Not on file  Social History Narrative   Brandy Armstrong was born and reared in [place]. She attended [employer] and graduated in 1971 with her [place] in Education. 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 She then worked for a [employer] in [place]. She is single. She is currently retired. She loves reading. She loves to spend time with her dog.       Caffeine use: 2 cups coffee per day   2 glasses iced tea/ day   Right-handed    Family History  Problem Relation Age  of Onset  . Depression Mother   . Cancer Mother 37       pancreatic cancer  . Cancer Father 25       colon cancer  . Cancer Sister 30       breast cancer  . Breast cancer Sister 51  . Diabetes Brother   . Neuropathy Neg Hx     ROS: No fever, myalgias/arthralgias, unexplained weight loss or weight gain No new focal weakness or sensory deficits No otalgia, hearing loss, visual changes, nasal and sinus symptoms, mouth and throat problems No neck pain or adenopathy No abdominal pain, N/V/D, diarrhea, change in bowel pattern No dysuria, change in urinary pattern   Vitals:   11/04/17 1037 11/04/17 1043  BP:  110/80  Pulse:  96  SpO2:  95%  Weight: 101.6 kg (224 lb)   Height: 5' 6.5" (1.689 m)   Room air  EXAM:  Gen:  Obese, No overt respiratory distress HEENT: NCAT, sclera white, oropharynx normal Neck: Supple without LAN, thyromegaly, JVD Lungs: breath sounds mildly diminished, percussion normal, no wheezes or other adventitious sounds Cardiovascular: RRR, no murmurs noted Abdomen: Soft, nontender, normal BS Ext: without clubbing, cyanosis, edema Neuro: CNs grossly intact, motor and sensory intact Skin: Limited exam, no lesions noted  DATA:   BMP Latest Ref Rng & Units 08/06/2017 01/28/2017 09/24/2016  Glucose 70 - 99 mg/dL 114(H) 85 82  BUN 6 - 23 mg/dL 12 13 14   Creatinine 0.40 - 1.20 mg/dL 1.06 0.90 0.90  BUN/Creat Ratio 11 - 26 - - -  Sodium 135 - 145 mEq/L 136 138 140  Potassium 3.5 - 5.1 mEq/L 4.5 4.1 4.5  Chloride 96 - 112 mEq/L 100 104 107  CO2 19 - 32 mEq/L 31 28 27   Calcium 8.4 - 10.5 mg/dL 9.3 9.0 8.8    CBC Latest Ref Rng & Units 08/06/2017 01/28/2017 09/24/2016  WBC 4.0 - 10.5 K/uL 6.2 6.9 7.1  Hemoglobin 12.0 - 15.0 g/dL 10.8(L) 10.5(L) 11.2(L)  Hematocrit 36.0 - 46.0 % 34.3(L) 34.5(L) 35.4(L)  Platelets 150.0 - 400.0 K/uL 279.0 231.0 219.0    CXR: No recent film  No recent PFTs  IMPRESSION:     ICD-10-CM   1. Moderate obesity E66.8   2. COPD, moderate ([place]) J44.9 AMB referral to pulmonary rehabilitation    Pulmonary Function Test [facility] Only    DG Chest 2 View  3. Severe DOE R06.09 AMB referral to pulmonary rehabilitation    Pulmonary Function Test [facility] Only    DG Chest 2 View  4. Chronic atrial fibrillation (H[facility]) I48.2   5. Physical deconditioning R53.81    Previously, her COPD was in the mild to moderate range.  However, there is been no recent measurement of lung function.  Her exertional dyspnea is almost certainly multifactorial with contributions from obesity, deconditioning, chronic atrial fibrillation.  She would likely benefit significantly from pulmonary rehabilitation.  PLAN:  Continue Symbicort and Spiriva inhalers Continue albuterol inhaler as needed I have  made referral to pulmonary rehabilitation program Follow-up in 6-8 weeks with PFTs and CXR prior to that visit   Merton Border, MD PCCM service Mobile [phone] Pager [phone] 11/06/2017 3:07 PM

## 2017-11-15 MED ORDER — HYDROXYCHLOROQUINE 200 MG TABLET
ORAL_TABLET | Freq: Two times a day (BID) | ORAL | 5 refills | 0.00000 days | Status: CP
Start: 2017-11-15 — End: 2018-03-18

## 2017-12-08 ENCOUNTER — Other Ambulatory Visit: Payer: Self-pay | Admitting: Family

## 2017-12-08 DIAGNOSIS — M545 Low back pain, unspecified: Secondary | ICD-10-CM

## 2017-12-16 MED ORDER — APIXABAN 5 MG TABLET
ORAL_TABLET | Freq: Two times a day (BID) | ORAL | 0 refills | 0 days | Status: CP
Start: 2017-12-16 — End: ?

## 2017-12-17 ENCOUNTER — Telehealth: Payer: Self-pay | Admitting: Pulmonary Disease

## 2017-12-17 ENCOUNTER — Other Ambulatory Visit: Payer: Self-pay | Admitting: Family

## 2017-12-17 NOTE — Telephone Encounter (Signed)
°*  STAT* If patient is at the pharmacy, call can be transferred to refill team.   1. Which medications need to be refilled? (please list name of each medication and dose if known)  Rescue inhaler   2. Which pharmacy/location (including street and city if local pharmacy) is [facility] [address] [facility] [place]    3. Do they need a 30 day or 90 day supply?

## 2017-12-17 NOTE — Telephone Encounter (Signed)
Left message for patient to return call. There is not a rescue inhaler on her chart.

## 2017-12-18 NOTE — Telephone Encounter (Signed)
Spoke with patient and informed that because she did not make physician aware of medication in November. She needs to contact pharmacy and have them fax request to Dr. Alva Garnet for his authorization. Nothing further needed.

## 2017-12-18 NOTE — Telephone Encounter (Signed)
STAT* If patient is at the pharmacy, call can be transferred to refill team.   1. Which medications need to be refilled? (please list name of each medication and dose if known)  Albuterol    2. Which pharmacy/location (including street and city if local pharmacy) is [facility] [address] [facility] [place]    3. Do they need a 30 day or 90 day supply?

## 2017-12-18 NOTE — Telephone Encounter (Signed)
Left a detailed message that we must speak to patient regarding request to refill Albuterol. Albuterol is not currently listed on patient's medication list.

## 2017-12-25 ENCOUNTER — Ambulatory Visit: Payer: Medicare Other | Attending: Pulmonary Disease

## 2017-12-25 DIAGNOSIS — R0600 Dyspnea, unspecified: Secondary | ICD-10-CM | POA: Insufficient documentation

## 2017-12-25 DIAGNOSIS — J449 Chronic obstructive pulmonary disease, unspecified: Secondary | ICD-10-CM | POA: Insufficient documentation

## 2017-12-25 DIAGNOSIS — R0609 Other forms of dyspnea: Secondary | ICD-10-CM | POA: Diagnosis not present

## 2017-12-25 MED ORDER — ALBUTEROL SULFATE (2.5 MG/3ML) 0.083% IN NEBU
2.5000 mg | INHALATION_SOLUTION | Freq: Once | RESPIRATORY_TRACT | Status: AC
  Administered 2017-12-25: 2.5 mg via RESPIRATORY_TRACT
  Filled 2017-12-25: qty 3

## 2018-01-01 ENCOUNTER — Ambulatory Visit (INDEPENDENT_AMBULATORY_CARE_PROVIDER_SITE_OTHER): Payer: Medicare Other | Admitting: Pulmonary Disease

## 2018-01-01 ENCOUNTER — Encounter: Payer: Self-pay | Admitting: Pulmonary Disease

## 2018-01-01 VITALS — BP 112/78 | HR 91 | Ht 66.5 in

## 2018-01-01 DIAGNOSIS — E668 Other obesity: Secondary | ICD-10-CM

## 2018-01-01 DIAGNOSIS — R942 Abnormal results of pulmonary function studies: Secondary | ICD-10-CM | POA: Diagnosis not present

## 2018-01-01 DIAGNOSIS — J449 Chronic obstructive pulmonary disease, unspecified: Secondary | ICD-10-CM | POA: Diagnosis not present

## 2018-01-01 MED ORDER — ALBUTEROL SULFATE HFA 108 (90 BASE) MCG/ACT IN AERS: 1.0000 | INHALATION_SPRAY | RESPIRATORY_TRACT | 10 refills | Status: DC | PRN

## 2018-01-01 NOTE — Patient Instructions (Addendum)
Continue Symbicort and Spiriva Continue albuterol inhaler as needed -refill prescription has been entered Referral to pulmonary rehab has been made Follow up in 3 months with target weight of 215#

## 2018-01-02 ENCOUNTER — Encounter: Payer: Self-pay | Admitting: Pulmonary Disease

## 2018-01-02 NOTE — Progress Notes (Signed)
PULMONARY OFFICE FOLLOW-UP NOTE  Requesting MD/Service: Self Date of initial consultation: 11/04/17 Reason for consultation: COPD  PT PROFILE: 72 y.o. female former smoker (1 PPD X 45 yrs, quit 2012) previously seen by Dr [place] Bells for COPD  DATA: 03/11/14 CTA chest: No pulmonary emboli or acute abnormality 03/24/14 office spirometry: Consistent with severe obstruction.  FEV1 is 1.12 L (44%) 12/25/17 PFTs: Mild to moderate mixed restriction/obstruction.  FEV1 1.75 L (72%), FEV1/FVC 75%, TLC 4.20 L (80%), RV 1.87 L (89%), DLCO 10.0 (40%), DLCO/VA 89% predicted  SUBJ:  This is a routine reevaluation.  She is here to review results of recently performed PFTs.  She has no new complaints.  She believes that she is modestly improved since initial evaluation in November.  While she continues to have moderate exertional dyspnea with little day-to-day variation.  She did not go to pulmonary rehab as previously referred due to concern about cost restraints.  Although we discussed the importance of weight loss last visit, her weight is unchanged. Denies CP, fever, purulent sputum, hemoptysis, LE edema and calf tenderness    Vitals:   01/01/18 1011 01/01/18 1018  BP:  112/78  Pulse:  91  SpO2:  97%  Height: 5' 6.5" (1.689 m)   RA  EXAM:  Gen: Obese, NAD HEENT: NCAT, sclera white, oropharynx normal Neck: No LAN, thyromegaly, JVD Lungs: breath sounds mildly diminished, no wheezes or other adventitious sounds Cardiovascular: IRIR, rate controlled, no murmurs noted Abdomen: Soft, nontender, normal BS Ext: without clubbing, cyanosis, edema Neuro: grossly intact Skin: Limited exam, no lesions noted  DATA:   BMP Latest Ref Rng & Units 08/06/2017 01/28/2017 09/24/2016  Glucose 70 - 99 mg/dL 114(H) 85 82  BUN 6 - 23 mg/dL 12 13 14   Creatinine 0.40 - 1.20 mg/dL 1.06 0.90 0.90  BUN/Creat Ratio 11 - 26 - - -  Sodium 135 - 145 mEq/L 136 138 140  Potassium 3.5 - 5.1 mEq/L 4.5 4.1 4.5  Chloride 96 - 112  mEq/L 100 104 107  CO2 19 - 32 mEq/L 31 28 27   Calcium 8.4 - 10.5 mg/dL 9.3 9.0 8.8    CBC Latest Ref Rng & Units 08/06/2017 01/28/2017 09/24/2016  WBC 4.0 - 10.5 K/uL 6.2 6.9 7.1  Hemoglobin 12.0 - 15.0 g/dL 10.8(L) 10.5(L) 11.2(L)  Hematocrit 36.0 - 46.0 % 34.3(L) 34.5(L) 35.4(L)  Platelets 150.0 - 400.0 K/uL 279.0 231.0 219.0    CXR: No recent film  IMPRESSION:     ICD-10-CM   1. COPD, moderate ([place]) J44.9 AMB referral to pulmonary rehabilitation  2. Restrictive pattern present on pulmonary function testing R94.2   3. Moderate obesity E66.8     PLAN:  Continue Symbicort and Spiriva Continue albuterol inhaler as needed -refill prescription has been entered We discussed weight loss including likely benefits potential strategies Referral to pulmonary rehab has been made Follow up in 3 months with target weight of 215#   Merton Border, MD PCCM service Mobile [phone] Pager [phone] 01/02/2018 12:37 PM

## 2018-02-13 MED ORDER — ELIQUIS 5 MG TABLET
ORAL_TABLET | 1 refills | 0 days | Status: CP
Start: 2018-02-13 — End: 2018-04-29

## 2018-02-23 ENCOUNTER — Other Ambulatory Visit: Payer: Self-pay | Admitting: Family

## 2018-02-24 ENCOUNTER — Other Ambulatory Visit: Payer: Self-pay | Admitting: *Deleted

## 2018-02-24 DIAGNOSIS — G250 Essential tremor: Secondary | ICD-10-CM

## 2018-02-24 MED ORDER — GABAPENTIN 600 MG PO TABS: 600.0000 mg | ORAL_TABLET | Freq: Three times a day (TID) | ORAL | 1 refills | Status: DC

## 2018-02-25 MED ORDER — PROPRANOLOL ER 80 MG CAPSULE,24 HR,EXTENDED RELEASE
ORAL_CAPSULE | Freq: Two times a day (BID) | ORAL | 1 refills | 0.00000 days | Status: CP
Start: 2018-02-25 — End: 2019-02-25

## 2018-03-10 ENCOUNTER — Other Ambulatory Visit: Payer: Self-pay | Admitting: Family

## 2018-03-10 DIAGNOSIS — M545 Low back pain, unspecified: Secondary | ICD-10-CM

## 2018-03-17 ENCOUNTER — Other Ambulatory Visit: Payer: Self-pay

## 2018-03-17 MED ORDER — ESCITALOPRAM OXALATE 10 MG PO TABS: 10.0000 mg | ORAL_TABLET | Freq: Every day | ORAL | 0 refills | Status: DC

## 2018-03-18 MED ORDER — HYDROXYCHLOROQUINE 200 MG TABLET
ORAL_TABLET | Freq: Two times a day (BID) | ORAL | 5 refills | 0.00000 days | Status: CP
Start: 2018-03-18 — End: 2019-03-15

## 2018-03-18 NOTE — Telephone Encounter (Signed)
Mychart message sent to schedule appointment

## 2018-03-18 NOTE — Telephone Encounter (Signed)
30 tabs of flexeril given  Please advise she needs f/u appt with me for more refills

## 2018-03-18 NOTE — Telephone Encounter (Signed)
Last office visit 01/28/17 Next office visit scheduled  Last filled 01/14/18

## 2018-03-20 ENCOUNTER — Ambulatory Visit: Payer: Self-pay

## 2018-03-20 NOTE — Telephone Encounter (Signed)
Patient called with c/o "SOB." She says "I believe my hemoglobin and hematocrit is low. I get like this when I'm needing to have a blood transfusion. I'm always SOB because of my COPD, but this has gotten worse the past 2 weeks. I noticed it more when I'm doing housework, taking a bath, anything that exerts myself." I asked about the severity, she says "mild-moderate." I asked about other symptoms, she said "no other symptoms." According to protocol, see PCP within 3 days, appointment made for Monday at 1430 with Mable Paris, FNP, care advice given, patient verbalized understanding.   Reason for Disposition . [1] MODERATE longstanding difficulty breathing (e.g., speaks in phrases, SOB even at rest, pulse 100-120) AND [2] SAME as normal  Answer Assessment - Initial Assessment Questions 1. RESPIRATORY STATUS: "Describe your breathing?" (e.g., wheezing, shortness of breath, unable to speak, severe coughing)      Shortness of breath with exertion 2. ONSET: "When did this breathing problem begin?"      2 weeks ago it got worse 3. PATTERN "Does the difficult breathing come and go, or has it been constant since it started?"      Constant 4. SEVERITY: "How bad is your breathing?" (e.g., mild, moderate, severe)    - MILD: No SOB at rest, mild SOB with walking, speaks normally in sentences, can lay down, no retractions, pulse < 100.    - MODERATE: SOB at rest, SOB with minimal exertion and prefers to sit, cannot lie down flat, speaks in phrases, mild retractions, audible wheezing, pulse 100-120.    - SEVERE: Very SOB at rest, speaks in single words, struggling to breathe, sitting hunched forward, retractions, pulse > 120      Mild-Moderate 5. RECURRENT SYMPTOM: "Have you had difficulty breathing before?" If so, ask: "When was the last time?" and "What happened that time?"      Yes 6. CARDIAC HISTORY: "Do you have any history of heart disease?" (e.g., heart attack, angina, bypass surgery, angioplasty)       A-Fib 7. LUNG HISTORY: "Do you have any history of lung disease?"  (e.g., pulmonary embolus, asthma, emphysema)     COPD 8. CAUSE: "What do you think is causing the breathing problem?"      Low hemoglobin, hematocrit 9. OTHER SYMPTOMS: "Do you have any other symptoms? (e.g., dizziness, runny nose, cough, chest pain, fever)     No 10. PREGNANCY: "Is there any chance you are pregnant?" "When was your last menstrual period?"       No 11. TRAVEL: "Have you traveled out of the country in the last month?" (e.g., travel history, exposures)       No  Protocols used: BREATHING DIFFICULTY-A-AH

## 2018-03-23 ENCOUNTER — Ambulatory Visit (INDEPENDENT_AMBULATORY_CARE_PROVIDER_SITE_OTHER): Payer: Medicare Other | Admitting: Family

## 2018-03-23 ENCOUNTER — Encounter: Payer: Self-pay | Admitting: Family

## 2018-03-23 VITALS — BP 126/76 | HR 108 | Temp 98.2°F | Resp 18 | Ht 66.0 in | Wt 225.0 lb

## 2018-03-23 DIAGNOSIS — J438 Other emphysema: Secondary | ICD-10-CM

## 2018-03-23 DIAGNOSIS — F339 Major depressive disorder, recurrent, unspecified: Secondary | ICD-10-CM

## 2018-03-23 DIAGNOSIS — I480 Paroxysmal atrial fibrillation: Secondary | ICD-10-CM | POA: Diagnosis not present

## 2018-03-23 DIAGNOSIS — F32A Depression, unspecified: Secondary | ICD-10-CM | POA: Insufficient documentation

## 2018-03-23 DIAGNOSIS — Z1231 Encounter for screening mammogram for malignant neoplasm of breast: Secondary | ICD-10-CM

## 2018-03-23 DIAGNOSIS — F329 Major depressive disorder, single episode, unspecified: Secondary | ICD-10-CM | POA: Insufficient documentation

## 2018-03-23 DIAGNOSIS — D509 Iron deficiency anemia, unspecified: Secondary | ICD-10-CM | POA: Diagnosis not present

## 2018-03-23 DIAGNOSIS — Z1239 Encounter for other screening for malignant neoplasm of breast: Secondary | ICD-10-CM

## 2018-03-23 MED ORDER — BUPROPION HCL ER (XL) 150 MG PO TB24: ORAL_TABLET | ORAL | 1 refills | Status: DC

## 2018-03-23 NOTE — Assessment & Plan Note (Signed)
Due to concerns for weight gain and also patient trouble getting motivated, we jointly agreed to a trial of Wellbutrin is an appropriate next step.  Discontinue Lexapro.  We will discuss the need for an adjunct such as BuSpar for anxiety at follow-up.

## 2018-03-23 NOTE — Assessment & Plan Note (Signed)
Stays in afib. On eliquis, Metoprolol.  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  Further Dr. Rockey Situ to establish care.

## 2018-03-23 NOTE — Progress Notes (Signed)
Subjective:    Patient ID: Brandy Armstrong, female    DOB: 03-22-1946, 72 y.o.   MRN: [id number]  CC: Brandy Armstrong is a 72 y.o. female who presents today for follow up.   HPI: Weight gain- not sure if it's depression. 'I dont want to do anything.' Trouble getting going.  Doesn't walk dog.Has been on keto in the past. Has noticed breathing improves when doesn't eat. No si/hi.Has been out of the klonopin for a long time. Hasnt been able to sleep. Had used in the past for sleep.   COPD- Cannot afford Pulmonary rehab. SOB at baseline, unchanged. Frustrating for her.   Afib-on elliquis, inderal . Doesn't feel palpitations. Had seen a [facility] cardiology  However Dr Luana Shu has since moved.    Reviewed chart follows with Dr Alva Garnet ( last OV 12/2017 after PFTs)  didn't go to pulmonary rehab; on symbicort and sprirva; advised weight loss  Dr Luana Shu 01/2017 Afib- s/p ablation 2013 and 2016 and had recurrence. No further bleeding on apixaban.         HISTORY:  Past Medical History:  Diagnosis Date  . Atrial fibrillation [facility])    a. s/p ablation 01/2012 in [facility] by Dr. Boyd Kerbs;  b. On sotalol & Xarelto;  c. 02/2014 Echo: EF 50-55%, mild conc LVH, nl LA size/structure;  d. Recurrent afib 8/15 & 08/29/2014.  . Barretts esophagus   . Chest pain    a. 02/2014 Myoview: Ef 50%, no ischemia.  . Colon polyps   . COPD (chronic obstructive pulmonary disease) ([place])   . COPD (chronic obstructive pulmonary disease) ([place])   . Depression with anxiety   . GERD (gastroesophageal reflux disease)   . Neuropathy   . Rectal fistula   . Rheumatoid arthritis ([place])    On methotrexate and orencia  . Urinary incontinence   . Vitamin D deficiency    Past Surgical History:  Procedure Laterality Date  . ABDOMINAL HYSTERECTOMY  1992  . CARDIAC ELECTROPHYSIOLOGY STUDY AND ABLATION  2013  . CHOLECYSTECTOMY  1987  . COLONOSCOPY N/A 05/08/2015   Procedure: COLONOSCOPY;  Surgeon: Manya Silvas, MD;  Location: [facility]  ENDOSCOPY;  Service: Endoscopy;  Laterality: N/A;  . ESOPHAGOGASTRODUODENOSCOPY N/A 05/06/2015   Procedure: ESOPHAGOGASTRODUODENOSCOPY (EGD);  Surgeon: Lollie Sails, MD;  Location: [facility] ENDOSCOPY;  Service: Endoscopy;  Laterality: N/A;  . oophrectomy Bilateral 1992   Family History  Problem Relation Age of Onset  . Depression Mother   . Cancer Mother 63       pancreatic cancer  . Cancer Father 62       colon cancer  . Cancer Sister 30       breast cancer  . Breast cancer Sister 67  . Diabetes Brother   . Neuropathy Neg Hx     Allergies: Latex Current Outpatient Medications on File Prior to Visit  Medication Sig Dispense Refill  . albuterol (PROVENTIL HFA;VENTOLIN HFA) 108 (90 Base) MCG/ACT inhaler Inhale 1-2 puffs into the lungs every 4 (four) hours as needed for wheezing or shortness of breath. 1 Inhaler 10  . budesonide-formoterol (SYMBICORT) 160-4.5 MCG/ACT inhaler INHALE 2 PUFFS TWO (2) TIMES A DAY. 10.2 Inhaler 1  . cyclobenzaprine (FLEXERIL) 10 MG tablet TAKE 1 TABLET BY MOUTH EVERY NIGHT AT BEDTIME 30 tablet 0  . ELIQUIS 5 MG TABS tablet TAKE 1 TABLET (5 MG TOTAL) BY MOUTH TWO (2) TIMES A DAY.  6  . esomeprazole (NEXIUM) 40 MG capsule TAKE ONE CAPSULE  BY MOUTH DAILY AT 12 NOON 30 capsule 0  . gabapentin (NEURONTIN) 600 MG tablet Take 1 tablet (600 mg total) by mouth 3 (three) times daily. PLEASE SCHEDULE OFFICE VISIT FOR FURTHER REFILLS 270 tablet 1  . hydroxychloroquine (PLAQUENIL) 200 MG tablet Take 200 mg by mouth.    . lidocaine (XYLOCAINE) 5 % ointment Apply 1 application topically as needed.    . propranolol ER (INDERAL LA) 80 MG 24 hr capsule Take 80 mg by mouth.    . SPIRIVA RESPIMAT 2.5 MCG/ACT AERS INHALE 2 PUFFS BY MOUTH DAILY.  6  . Tofacitinib Citrate 5 MG TABS Take 5 mg by mouth.     No current facility-administered medications on file prior to visit.     Social History   Tobacco Use  . Smoking status: Former Smoker    Packs/day: 1.00    Years: 40.00      Pack years: 40.00    Last attempt to quit: 12/16/2011    Years since quitting: 6.2  . Smokeless tobacco: Never Used  Substance Use Topics  . Alcohol use: Yes    Alcohol/week: 0.0 oz    Comment: Occasionally has a drink  . Drug use: No    Review of Systems  Constitutional: Negative for chills and fever.  Respiratory: Positive for shortness of breath. Negative for cough and wheezing.   Cardiovascular: Negative for chest pain and palpitations.  Gastrointestinal: Negative for nausea and vomiting.  Psychiatric/Behavioral: Positive for sleep disturbance. Negative for suicidal ideas. The patient is not nervous/anxious.       Objective:    BP 126/76 (BP Location: Left Arm, Patient Position: Sitting, Cuff Size: Large)   Pulse (!) 108   Temp 98.2 F (36.8 C) (Oral)   Resp 18   Ht 5\' 6"  (1.676 m)   Wt 225 lb (102.1 kg)   SpO2 97%   BMI 36.32 kg/m  BP Readings from Last 3 Encounters:  03/23/18 126/76  01/01/18 112/78  11/04/17 110/80   Wt Readings from Last 3 Encounters:  03/23/18 225 lb (102.1 kg)  11/04/17 224 lb (101.6 kg)  08/15/17 215 lb (97.5 kg)    Physical Exam  Constitutional: She appears well-developed and well-nourished.  Eyes: Conjunctivae are normal.  Cardiovascular: Normal rate, regular rhythm, normal heart sounds and normal pulses.  Pulmonary/Chest: Effort normal and breath sounds normal. She has no wheezes. She has no rhonchi. She has no rales.  Neurological: She is alert.  Skin: Skin is warm and dry.  Psychiatric: She has a normal mood and affect. Her speech is normal and behavior is normal. Thought content normal.  Vitals reviewed.      Assessment & Plan:   Problem List Items Addressed This Visit      Cardiovascular and Mediastinum   Atrial fibrillation ([place])    Stays in afib. On eliquis, Metoprolol.  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  Further Dr. Rockey Situ to establish care.      Relevant Orders   Ambulatory referral to Cardiology      Respiratory   COPD (chronic obstructive pulmonary disease) (HCC)    Chronic.  Shortness of breath unchanged from baseline.  Reviewed Dr. Lanetta Inch  Notes and spoke with patient at length about weight gain being contributory for shortness of breath.  Will follow        Other   Screening for breast cancer    Ordered.  Patient understands to schedule      Relevant Orders   MM  SCREENING BREAST TOMO BILATERAL   Anemia   Relevant Orders   Ferritin   IBC panel   TSH   Hemoglobin A1c   Depression, recurrent ([place]) - Primary    Due to concerns for weight gain and also patient trouble getting motivated, we jointly agreed to a trial of Wellbutrin is an appropriate next step.  Discontinue Lexapro.  We will discuss the need for an adjunct such as BuSpar for anxiety at follow-up.        Relevant Medications   buPROPion (WELLBUTRIN XL) 150 MG 24 hr tablet       I have discontinued Kalynn C. Kristiansen's escitalopram. I am also having her start on buPROPion. Additionally, I am having her maintain her SPIRIVA RESPIMAT, ELIQUIS, hydroxychloroquine, lidocaine, budesonide-formoterol, propranolol ER, Tofacitinib Citrate, albuterol, esomeprazole, gabapentin, and cyclobenzaprine.   Meds ordered this encounter  Medications  . buPROPion (WELLBUTRIN XL) 150 MG 24 hr tablet    Sig: Take one tablet by mouth every morning for 7 days, and then increase to two tablets by mouth every morning.    Dispense:  120 tablet    Refill:  1    Order Specific Question:   Supervising Provider    Answer:   Crecencio Mc [[id number]]    Return precautions given.   Risks, benefits, and alternatives of the medications and treatment plan prescribed today were discussed, and patient expressed understanding.   Education regarding symptom management and diagnosis given to patient on AVS.  Continue to follow with [name], FNP for routine health maintenance.   [name] and I agreed with plan.   Mable Paris,  FNP

## 2018-03-23 NOTE — Assessment & Plan Note (Signed)
Ordered. Patient understands to schedule 

## 2018-03-23 NOTE — Patient Instructions (Addendum)
Trial of wellbutrin.   Taper of lexapro- take 10mg  every other day for one week, then every 3rd day for week, then stop.   Labs today  Let me know when prevnar was.   We can start buspar if you have anxiety at follow up  We placed a referral for mammogram this year. I asked that you call one the below locations and schedule this when it is convenient for you.   As discussed, I would like you to ask for 3D mammogram over the traditional 2D mammogram as new evidence suggest 3D is superior.   Please note that NOT all insurance companies cover 3D and you may have to pay a higher copay. You may call your insurance company to further clarify your benefits.   Options for Brandy Armstrong  [place], [place]  * [facility] [place], [place] * Note if you ask for 3D mammogram at this location, you must request [place], [place] location*   This is  Dr. Lupita Dawn  example of a  "Low GI"  Diet:  It will allow you to lose 4 to 8  lbs  per month if you follow it carefully.  Your goal with exercise is a minimum of 30 minutes of aerobic exercise 5 days per week (Walking does not count once it becomes easy!)    All of the foods can be found at grocery stores and in bulk at [employer].  The Atkins protein bars and shakes are available in more varieties at Target, [employer] and [place].     7 AM Breakfast:  Choose from the following:  Low carbohydrate Protein  Shakes (I recommend the  Premier Protein chocolate shakes,  EAS AdvantEdge "Carb Control" shakes  Or the Atkins shakes all are under 3 net carbs)     a scrambled egg/bacon/cheese burrito made with Mission's "carb balance" whole wheat tortilla  (about 10 net carbs )  Regulatory affairs officer (basically a quiche without the pastry crust) that is eaten cold and very convenient way to get  your eggs.  8 carbs)  If you make your own protein shakes, avoid bananas and pineapple,  And use low carb greek yogurt or original /unsweetened almond or soy milk    Avoid cereal and bananas, oatmeal and cream of wheat and grits. They are loaded with carbohydrates!   10 AM: high protein snack:  Protein bar by Atkins (the snack size, under 200 cal, usually < 6 net carbs).    A stick of cheese:  Around 1 carb,  100 cal     Dannon Light n Fit [place] Yogurt  (80 cal, 8 carbs)  Other so called "protein bars" and Greek yogurts tend to be loaded with carbohydrates.  Remember, in food advertising, the word "energy" is synonymous for " carbohydrate."  Lunch:   A Sandwich using the bread choices listed, Can use any  Eggs,  lunchmeat, grilled meat or canned tuna), avocado, regular mayo/mustard  and cheese.  A Salad using blue cheese, ranch,  Goddess or vinagrette,  Avoid taco shells, croutons or "confetti" and no "candied nuts" but regular nuts OK.   No pretzels, nabs  or chips.  Pickles and miniature sweet peppers are a good low carb alternative that provide a "crunch"  The bread is the only source of carbohydrate in a sandwich  and  can be decreased by trying some of the attached alternatives to traditional loaf bread   Avoid "Low fat dressings, as well as [place] dressings They are loaded with sugar!   3 PM/ Mid day  Snack:  Consider  1 ounce of  almonds, walnuts, pistachios, pecans, peanuts,  Macadamia nuts or a nut medley.  Avoid "granola and granola bars "  Mixed nuts are ok in moderation as long as there are no raisins,  cranberries or dried fruit.   KIND bars are OK if you get the low glycemic index variety   Try the prosciutto/mozzarella cheese sticks by Fiorruci  In deli /backery section   High protein      6 PM  Dinner:     Meat/fowl/fish with a green salad, and either broccoli, cauliflower, green beans, spinach, brussel sprouts or  Lima beans. DO NOT BREAD THE  PROTEIN!!      There is a low carb pasta by Dreamfield's that is acceptable and tastes great: only 5 digestible carbs/serving.( All grocery stores but BJs carry it ) Several ready made meals are available low carb:   Try Michel Angelo's chicken piccata or chicken or eggplant parm over low carb pasta.(Lowes and BJs)   Marjory Lies Sanchez's "Carnitas" (pulled pork, no sauce,  0 carbs) or his beef pot roast to make a dinner burrito (at BJ's)  Pesto over low carb pasta (bj's sells a good quality pesto in the center refrigerated section of the deli   Try satueeing  Cheral Marker with mushroooms as a good side   [employer] Giant makes a mashed cauliflower that tastes like mashed potatoes  Whole wheat pasta is still full of digestible carbs and  Not as low in glycemic index as Dreamfield's.   Brown rice is still rice,  So skip the rice and noodles if you eat [place] or [place] (or at least limit to 1/2 cup)  9 PM snack :   Breyer's "low carb" fudgsicle or  ice cream bar (Carb Smart line), or  Weight Watcher's ice cream bar , or another "no sugar added" ice cream;  a serving of fresh berries/cherries with whipped cream   Cheese or DANNON'S LlGHT N FIT GREEK YOGURT  8 ounces of [place] unsweetened almond/cococunut milk    Treat yourself to a parfait made with whipped cream blueberiies, walnuts and vanilla greek yogurt  Avoid bananas, pineapple, grapes  and watermelon on a regular basis because they are high in sugar.  THINK OF THEM AS DESSERT  Remember that snack Substitutions should be less than 10 NET carbs per serving and meals < 20 carbs. Remember to subtract fiber grams to get the "net carbs."  @TULLOBREADPACKAGE @

## 2018-03-23 NOTE — Assessment & Plan Note (Signed)
Chronic.  Shortness of breath unchanged from baseline.  Reviewed Dr. Lanetta Inch  Notes and spoke with patient at length about weight gain being contributory for shortness of breath.  Will follow

## 2018-03-24 LAB — IBC PANEL
Iron: 41 ug/dL — ABNORMAL LOW (ref 42–145)
Saturation Ratios: 8.4 % — ABNORMAL LOW (ref 20.0–50.0)
Transferrin: 350 mg/dL (ref 212.0–360.0)

## 2018-03-24 LAB — FERRITIN: FERRITIN: 10.9 ng/mL (ref 10.0–291.0)

## 2018-03-24 LAB — TSH: TSH: 0.53 u[IU]/mL (ref 0.35–4.50)

## 2018-03-24 LAB — HEMOGLOBIN A1C: HEMOGLOBIN A1C: 5.8 % (ref 4.6–6.5)

## 2018-03-25 ENCOUNTER — Telehealth: Payer: Self-pay | Admitting: Family

## 2018-03-25 DIAGNOSIS — D509 Iron deficiency anemia, unspecified: Secondary | ICD-10-CM

## 2018-03-25 NOTE — Telephone Encounter (Signed)
Unfortunately not, stability is only good for 24 hours.

## 2018-03-25 NOTE — Telephone Encounter (Signed)
Can I add on cbc to labs done on Monday?  I ordered

## 2018-03-26 ENCOUNTER — Telehealth: Payer: Self-pay | Admitting: Family

## 2018-03-26 NOTE — Telephone Encounter (Signed)
Copied from [place] [id number]. Topic: Quick Communication - Lab Results >> Mar 26, 2018  9:59 AM Corie Chiquito, [place] wrote: Patient calling to check the status of her labs. She would like a call back about this at [phone]

## 2018-03-27 ENCOUNTER — Other Ambulatory Visit: Payer: Self-pay | Admitting: Family

## 2018-03-27 DIAGNOSIS — D500 Iron deficiency anemia secondary to blood loss (chronic): Secondary | ICD-10-CM

## 2018-03-27 NOTE — Telephone Encounter (Signed)
Result note sent with labs

## 2018-04-01 ENCOUNTER — Telehealth: Payer: Self-pay | Admitting: Family

## 2018-04-01 DIAGNOSIS — R531 Weakness: Secondary | ICD-10-CM

## 2018-04-01 NOTE — Telephone Encounter (Signed)
Copied from [place] [id number]. Topic: Inquiry >> Apr 01, 2018  2:40 PM Scherrie Gerlach wrote: Reason for CRM: pt states she needs a letter stating she can have a raised up, higher toilet in her apartment. Pt lives in [place] housing and they will install a new toilet if she has a letter from the doctor. The toilet she has now is so low, for several days she could not get up off the toilet. It is so very low to the ground.  Pt would also like [name] to look into getting her PT for her weak legs. Pt hopes her insurance will pay.

## 2018-04-03 NOTE — Telephone Encounter (Signed)
Call pt Letter provided for higher toilet- please mail Ref to PT She needs to come in for one more lab , a cbc which wasn't drawn. This will give [place] a full picture of her anemia. Please make a lab appt for her early next week.  How is she on the wellbutrin?

## 2018-04-03 NOTE — Addendum Note (Signed)
Addended by: [name] on: 04/03/2018 11:42 AM   Modules accepted: Orders

## 2018-04-03 NOTE — Telephone Encounter (Signed)
Spoke with patient she thinks the wellbutrin is giving her more energy.  Advised of below and verbalized understanding. Mailed letter. Lab appointment scheduled.

## 2018-04-06 ENCOUNTER — Other Ambulatory Visit: Payer: Medicare Other

## 2018-04-07 ENCOUNTER — Telehealth: Payer: Self-pay | Admitting: Pulmonary Disease

## 2018-04-07 NOTE — Telephone Encounter (Signed)
Pt c/o Shortness Of Breath: STAT if SOB developed within the last 24 hours or pt is noticeably SOB on the phone  1. Are you currently SOB (can you hear that pt is SOB on the phone)?  No   2. How long have you been experiencing SOB?  Since dx copd but has recently gotten worse   3. Are you SOB when sitting or when up moving around? On exertion   4. Are you currently experiencing any other symptoms? Recent crackle cough 3 days   Please call to discuss

## 2018-04-07 NOTE — Telephone Encounter (Signed)
Pt scheduled acute visit with DR advised that if symptoms worsen patient can go to ER. She has a lab draw at pcp office tomorrow.

## 2018-04-08 ENCOUNTER — Other Ambulatory Visit (INDEPENDENT_AMBULATORY_CARE_PROVIDER_SITE_OTHER): Payer: Medicare Other

## 2018-04-08 DIAGNOSIS — D500 Iron deficiency anemia secondary to blood loss (chronic): Secondary | ICD-10-CM | POA: Diagnosis not present

## 2018-04-08 LAB — CBC WITH DIFFERENTIAL/PLATELET
BASOS PCT: 0.2 % (ref 0.0–3.0)
Basophils Absolute: 0 10*3/uL (ref 0.0–0.1)
EOS ABS: 0 10*3/uL (ref 0.0–0.7)
Eosinophils Relative: 0.6 % (ref 0.0–5.0)
HCT: 28.3 % — ABNORMAL LOW (ref 36.0–46.0)
Hemoglobin: 9.1 g/dL — ABNORMAL LOW (ref 12.0–15.0)
Lymphocytes Relative: 23.1 % (ref 12.0–46.0)
Lymphs Abs: 1.4 10*3/uL (ref 0.7–4.0)
MCHC: 32 g/dL (ref 30.0–36.0)
MCV: 77.2 fl — ABNORMAL LOW (ref 78.0–100.0)
MONO ABS: 0.8 10*3/uL (ref 0.1–1.0)
Monocytes Relative: 13.4 % — ABNORMAL HIGH (ref 3.0–12.0)
NEUTROS ABS: 3.7 10*3/uL (ref 1.4–7.7)
Neutrophils Relative %: 62.7 % (ref 43.0–77.0)
PLATELETS: 295 10*3/uL (ref 150.0–400.0)
RBC: 3.67 Mil/uL — ABNORMAL LOW (ref 3.87–5.11)
RDW: 17.7 % — AB (ref 11.5–15.5)
WBC: 5.9 10*3/uL (ref 4.0–10.5)

## 2018-04-09 ENCOUNTER — Ambulatory Visit: Payer: Medicare Other | Admitting: Internal Medicine

## 2018-04-10 ENCOUNTER — Other Ambulatory Visit: Payer: Self-pay | Admitting: Family

## 2018-04-10 ENCOUNTER — Telehealth: Payer: Self-pay | Admitting: Family

## 2018-04-10 ENCOUNTER — Encounter: Payer: Self-pay | Admitting: Family

## 2018-04-10 DIAGNOSIS — D649 Anemia, unspecified: Secondary | ICD-10-CM

## 2018-04-10 NOTE — Progress Notes (Signed)
close

## 2018-04-10 NOTE — Telephone Encounter (Signed)
Pt would like to be contacted by the nurse or pcp about her situation

## 2018-04-10 NOTE — Telephone Encounter (Signed)
  Brandy Armstrong- how soon can GI see her? She needs GI appt.       Spoke with patient  Can tell that anemia has gotten worse.  Feels like anemia is making her tired. No energy.  No blood in stool.   No increase in cough, sob, wheezing.   Advised to go to ED over weekend, if feels worse.   Will work on GI referral for next week

## 2018-04-10 NOTE — Telephone Encounter (Signed)
Copied from [place] [id number]. Topic: Quick Communication - Lab Results >> Apr 10, 2018  3:02 PM Hewitt Shorts wrote: Pt is wanting to talk about her CBC and the anemia -she is still feeling tired and run down and can something be done before the referral is completed  Best number [phone]

## 2018-04-10 NOTE — Telephone Encounter (Signed)
Copied from [place] [id number]. Topic: Quick Communication - See Telephone Encounter >> Apr 10, 2018 12:18 PM Cleaster Corin, [place] wrote: CRM for notification. See Telephone encounter for: 04/10/18. Pt. Calling to have her labs released on to my chart. She stated that it was done on this week. Pt. Can be reached at [phone]

## 2018-04-13 NOTE — Telephone Encounter (Signed)
[name] spoke with patient on 04/10/18 regarding results

## 2018-04-14 ENCOUNTER — Ambulatory Visit: Payer: Medicare Other | Admitting: Pulmonary Disease

## 2018-04-15 ENCOUNTER — Other Ambulatory Visit: Payer: Self-pay | Admitting: Family

## 2018-04-15 NOTE — Addendum Note (Signed)
Addended by: Johna Sheriff on: 04/15/2018 02:54 PM   Modules accepted: Orders

## 2018-04-15 NOTE — Progress Notes (Signed)
She is not taking wellbutrin

## 2018-04-15 NOTE — Telephone Encounter (Signed)
Per [name] suspects not feeling well is due to anemia.   Other things , COPD or any depression playing a role.  Patient is on , is she currently taking Wellbutrin and at what dose? Can go to 300mg  if she is on 150mg ? Also could consult pulmonology.

## 2018-04-15 NOTE — Telephone Encounter (Addendum)
She is stopped Wellbutrin due she thought is was making her feel bad and short of breath, not sure.  However maybe anemia making her feel bad.   She is wondering if she could have a vitamin B12 level  and CBC drawn . She states she has been taking iron pills since last Friday and there old , not sure if there are effective.  Please advise. Per [name] can order stool cards, B12 with folate, CBC with diff, orders entered. Patient scheduled

## 2018-04-15 NOTE — Telephone Encounter (Signed)
Pt went to the GI appt but did not have have the $50 copay they needed.  Pt will have to get her [facility] charity care reinstated, but this may be a while. In the meantime, pt states she feels terrible, no energy and wants to know what else she can do?

## 2018-04-21 ENCOUNTER — Other Ambulatory Visit: Payer: Medicare Other

## 2018-04-24 ENCOUNTER — Ambulatory Visit: Payer: Medicare Other | Admitting: Family

## 2018-04-30 MED ORDER — ELIQUIS 5 MG TABLET
ORAL_TABLET | 0 refills | 0 days | Status: CP
Start: 2018-04-30 — End: 2018-06-22

## 2018-05-01 ENCOUNTER — Ambulatory Visit: Payer: Medicare Other | Admitting: Family

## 2018-05-04 ENCOUNTER — Other Ambulatory Visit (INDEPENDENT_AMBULATORY_CARE_PROVIDER_SITE_OTHER): Payer: Medicare Other

## 2018-05-04 DIAGNOSIS — D649 Anemia, unspecified: Secondary | ICD-10-CM | POA: Diagnosis not present

## 2018-05-04 LAB — CBC WITH DIFFERENTIAL/PLATELET
BASOS ABS: 0 10*3/uL (ref 0.0–0.1)
Basophils Relative: 0.5 % (ref 0.0–3.0)
EOS ABS: 0.1 10*3/uL (ref 0.0–0.7)
Eosinophils Relative: 1.1 % (ref 0.0–5.0)
HCT: 32.2 % — ABNORMAL LOW (ref 36.0–46.0)
Hemoglobin: 10.2 g/dL — ABNORMAL LOW (ref 12.0–15.0)
LYMPHS ABS: 1.2 10*3/uL (ref 0.7–4.0)
Lymphocytes Relative: 20.3 % (ref 12.0–46.0)
MCHC: 31.5 g/dL (ref 30.0–36.0)
MCV: 83 fl (ref 78.0–100.0)
MONO ABS: 0.6 10*3/uL (ref 0.1–1.0)
MONOS PCT: 10.5 % (ref 3.0–12.0)
NEUTROS PCT: 67.6 % (ref 43.0–77.0)
Neutro Abs: 3.9 10*3/uL (ref 1.4–7.7)
PLATELETS: 255 10*3/uL (ref 150.0–400.0)
RBC: 3.88 Mil/uL (ref 3.87–5.11)
RDW: 23.4 % — ABNORMAL HIGH (ref 11.5–15.5)
WBC: 5.8 10*3/uL (ref 4.0–10.5)

## 2018-05-04 LAB — B12 AND FOLATE PANEL
Folate: 8.9 ng/mL (ref >5.9)
VITAMIN B 12: 197 pg/mL — AB (ref 211–911)

## 2018-05-06 ENCOUNTER — Other Ambulatory Visit: Payer: Self-pay | Admitting: Family

## 2018-05-06 DIAGNOSIS — D508 Other iron deficiency anemias: Secondary | ICD-10-CM

## 2018-05-06 NOTE — Progress Notes (Deleted)
Subjective:    Patient ID: Brandy Armstrong, female    DOB: 1946/08/03, 72 y.o.   MRN: [id number]  CC: Brandy Armstrong is a 72 y.o. female who presents today for follow up.   HPI: Labs today to evaluate for pernicious anemia  Start b12 injections??      HISTORY:  Past Medical History:  Diagnosis Date  . Atrial fibrillation [facility])    a. s/p ablation 01/2012 in [facility] by Dr. Boyd Kerbs;  b. On sotalol & Xarelto;  c. 02/2014 Echo: EF 50-55%, mild conc LVH, nl LA size/structure;  d. Recurrent afib 8/15 & 08/29/2014.  . Barretts esophagus   . Chest pain    a. 02/2014 Myoview: Ef 50%, no ischemia.  . Colon polyps   . COPD (chronic obstructive pulmonary disease) ([place])   . COPD (chronic obstructive pulmonary disease) ([place])   . Depression with anxiety   . GERD (gastroesophageal reflux disease)   . Neuropathy   . Rectal fistula   . Rheumatoid arthritis ([place])    On methotrexate and orencia  . Urinary incontinence   . Vitamin D deficiency    Past Surgical History:  Procedure Laterality Date  . ABDOMINAL HYSTERECTOMY  1992  . CARDIAC ELECTROPHYSIOLOGY STUDY AND ABLATION  2013  . CHOLECYSTECTOMY  1987  . COLONOSCOPY N/A 05/08/2015   Procedure: COLONOSCOPY;  Surgeon: Manya Silvas, MD;  Location: [facility] ENDOSCOPY;  Service: Endoscopy;  Laterality: N/A;  . ESOPHAGOGASTRODUODENOSCOPY N/A 05/06/2015   Procedure: ESOPHAGOGASTRODUODENOSCOPY (EGD);  Surgeon: Lollie Sails, MD;  Location: [facility] ENDOSCOPY;  Service: Endoscopy;  Laterality: N/A;  . oophrectomy Bilateral 1992   Family History  Problem Relation Age of Onset  . Depression Mother   . Cancer Mother 48       pancreatic cancer  . Cancer Father 28       colon cancer  . Cancer Sister 30       breast cancer  . Breast cancer Sister 70  . Diabetes Brother   . Neuropathy Neg Hx     Allergies: Latex Current Outpatient Medications on File Prior to Visit  Medication Sig Dispense Refill  . albuterol (PROVENTIL HFA;VENTOLIN HFA) 108 (90  Base) MCG/ACT inhaler Inhale 1-2 puffs into the lungs every 4 (four) hours as needed for wheezing or shortness of breath. 1 Inhaler 10  . budesonide-formoterol (SYMBICORT) 160-4.5 MCG/ACT inhaler INHALE 2 PUFFS TWO (2) TIMES A DAY. 10.2 Inhaler 1  . cyclobenzaprine (FLEXERIL) 10 MG tablet TAKE 1 TABLET BY MOUTH EVERY NIGHT AT BEDTIME 30 tablet 0  . ELIQUIS 5 MG TABS tablet TAKE 1 TABLET (5 MG TOTAL) BY MOUTH TWO (2) TIMES A DAY.  6  . esomeprazole (NEXIUM) 40 MG capsule TAKE ONE CAPSULE BY MOUTH DAILY AT 12 NOON 30 capsule 0  . gabapentin (NEURONTIN) 600 MG tablet Take 1 tablet (600 mg total) by mouth 3 (three) times daily. PLEASE SCHEDULE OFFICE VISIT FOR FURTHER REFILLS 270 tablet 1  . hydroxychloroquine (PLAQUENIL) 200 MG tablet Take 200 mg by mouth.    . lidocaine (XYLOCAINE) 5 % ointment Apply 1 application topically as needed.    . propranolol ER (INDERAL LA) 80 MG 24 hr capsule Take 80 mg by mouth.    . SPIRIVA RESPIMAT 2.5 MCG/ACT AERS INHALE 2 PUFFS BY MOUTH DAILY.  6  . Tofacitinib Citrate 5 MG TABS Take 5 mg by mouth.     No current facility-administered medications on file prior to visit.  Social History   Tobacco Use  . Smoking status: Former Smoker    Packs/day: 1.00    Years: 40.00    Pack years: 40.00    Last attempt to quit: 12/16/2011    Years since quitting: 6.3  . Smokeless tobacco: Never Used  Substance Use Topics  . Alcohol use: Yes    Alcohol/week: 0.0 oz    Comment: Occasionally has a drink  . Drug use: No    Review of Systems    Objective:    There were no vitals taken for this visit. BP Readings from Last 3 Encounters:  03/23/18 126/76  01/01/18 112/78  11/04/17 110/80   Wt Readings from Last 3 Encounters:  03/23/18 225 lb (102.1 kg)  11/04/17 224 lb (101.6 kg)  08/15/17 215 lb (97.5 kg)    Physical Exam     Assessment & Plan:   Problem List Items Addressed This Visit    None       I am having Brandy Armstrong maintain her SPIRIVA  RESPIMAT, ELIQUIS, hydroxychloroquine, lidocaine, budesonide-formoterol, propranolol ER, Tofacitinib Citrate, albuterol, esomeprazole, gabapentin, and cyclobenzaprine.   No orders of the defined types were placed in this encounter.   Return precautions given.   Risks, benefits, and alternatives of the medications and treatment plan prescribed today were discussed, and patient expressed understanding.   Education regarding symptom management and diagnosis given to patient on AVS.  Continue to follow with [name], FNP for routine health maintenance.   [name] and I agreed with plan.   Mable Paris, FNP

## 2018-05-06 NOTE — Progress Notes (Signed)
close

## 2018-05-08 ENCOUNTER — Ambulatory Visit: Payer: Medicare Other | Admitting: Family

## 2018-05-10 NOTE — Progress Notes (Signed)
Cardiology Office Note  Date:  05/11/2018   ID:  Brandy Armstrong, DOB 23-Oct-1946, MRN [id number]  PCP:  [name], FNP   Chief Complaint  Patient presents with  . other    Afib c/o sob. Meds reviewed verbally with pt.    HPI:  [name] is a 72 year old woman with past medical history of COPD, smoked quit 2012  Depression Referred by Mable Paris for consultation of her paroxysmal Atrial fibrillation Normal EF >55% s/p ablation 2013 and 2016 and had recurrence. No further bleeding on apixaban.  Previously seen by cardiology at [facility], Last seen February 2018 Who presents By referral from Mable Paris to the [facility] office, for discussion of her paroxysmal atrial fibrillation  Long history of paroxysmal atrial fibrillation, prior records reviewed from [facility]  Notes indicating previousAF ablation by Dr. Isaias Sakai in early 2013  recurrence when sotalol was stopped.  repeat procedure on 02/21/15.  required DCCV on 03/21/15.  Recurrence of her atrial fibrillation and change to amiodarone gastrointestinal bleeding and xarelto was stopped and aspirin started.  She has since been put on apixaban DCCV 08/2016 for recurrent AF with restoration of sinus rhythm. recurrence of AF about a month later. amiodarone has been discontinued  She has hadPrevious discussions concerning rate-control, increased dose of amiodarone, repeat catheter ablation, hybrid ablation, and pacemaker placement with AV node ablation.  Propranolol started for essential tremor Metoprolol discontinued at that time  Echocardiogram preserved ejection fraction, 10/2016 Results discussed with her  Has fatigue, shortness of breath on exertion No regular exercise program She feels her symptoms may be secondary to low B-12, anemia She reports having Low B12, details pulled up in the office Anemia HCT 28, up to 32  Other prior records reviewed Stress test 2015, low risk  CT ABD 2015 ABD  athero  EKG personally reviewed by myself on todays visit Shows atrial fibrillation with ventricular rate 93 bpm nonspecific ST-T wave abnormality   PMH:   has a past medical history of Atrial fibrillation ([place]), Barretts esophagus, Chest pain, Colon polyps, COPD (chronic obstructive pulmonary disease) ([place]), COPD (chronic obstructive pulmonary disease) ([place]), Depression with anxiety, GERD (gastroesophageal reflux disease), Neuropathy, Rectal fistula, Rheumatoid arthritis ([place]), Urinary incontinence, and Vitamin D deficiency.  PSH:    Past Surgical History:  Procedure Laterality Date  . ABDOMINAL HYSTERECTOMY  1992  . CARDIAC ELECTROPHYSIOLOGY STUDY AND ABLATION  2013  . CHOLECYSTECTOMY  1987  . COLONOSCOPY N/A 05/08/2015   Procedure: COLONOSCOPY;  Surgeon: Manya Silvas, MD;  Location: [facility] ENDOSCOPY;  Service: Endoscopy;  Laterality: N/A;  . ESOPHAGOGASTRODUODENOSCOPY N/A 05/06/2015   Procedure: ESOPHAGOGASTRODUODENOSCOPY (EGD);  Surgeon: Lollie Sails, MD;  Location[facility] ENDOSCOPY;  Service: Endoscopy;  Laterality: N/A;  . oophrectomy Bilateral 1992    Current Outpatient Medications  Medication Sig Dispense Refill  . albuterol (PROVENTIL HFA;VENTOLIN HFA) 108 (90 Base) MCG/ACT inhaler Inhale 1-2 puffs into the lungs every 4 (four) hours as needed for wheezing or shortness of breath. 1 Inhaler 10  . budesonide-formoterol (SYMBICORT) 160-4.5 MCG/ACT inhaler INHALE 2 PUFFS TWO (2) TIMES A DAY. 10.2 Inhaler 1  . cyclobenzaprine (FLEXERIL) 10 MG tablet TAKE 1 TABLET BY MOUTH EVERY NIGHT AT BEDTIME 30 tablet 0  . ELIQUIS 5 MG TABS tablet Take 1 tablet (5 mg total) by mouth 2 (two) times daily. 180 tablet 3  . escitalopram (LEXAPRO) 10 MG tablet Take 10 mg by mouth daily.    [name] esomeprazole (NEXIUM) 40 MG capsule TAKE  ONE CAPSULE BY MOUTH DAILY AT 12 NOON 30 capsule 0  . ferrous sulfate 325 (65 FE) MG EC tablet Take 325 mg by mouth daily with breakfast.    . gabapentin (NEURONTIN) 600 MG  tablet Take 1 tablet (600 mg total) by mouth 3 (three) times daily. PLEASE SCHEDULE OFFICE VISIT FOR FURTHER REFILLS 270 tablet 1  . hydroxychloroquine (PLAQUENIL) 200 MG tablet Take 200 mg by mouth.    . lidocaine (XYLOCAINE) 5 % ointment Apply 1 application topically as needed.    . propranolol ER (INDERAL LA) 80 MG 24 hr capsule Take 1 capsule (80 mg total) by mouth daily. 90 capsule 3  . SPIRIVA RESPIMAT 2.5 MCG/ACT AERS INHALE 2 PUFFS BY MOUTH DAILY.  6  . Tofacitinib Citrate 5 MG TABS Take 5 mg by mouth.    . furosemide (LASIX) 20 MG tablet Take 1 tablet (20 mg total) by mouth daily as needed. 30 tablet 3   No current facility-administered medications for this visit.      Allergies:   Latex   Social History:  The patient  reports that she quit smoking about 6 years ago. She has a 40.00 pack-year smoking history. She has never used smokeless tobacco. She reports that she drinks alcohol. She reports that she does not use drugs.   Family History:   family history includes Breast cancer (age of onset: 23) in her sister; Cancer (age of onset: 47) in her sister; Cancer (age of onset: 18) in her father; Cancer (age of onset: 59) in her mother; Depression in her mother; Diabetes in her brother.    Review of Systems: Review of Systems  Constitutional: Positive for malaise/fatigue.  Respiratory: Positive for shortness of breath.   Cardiovascular: Negative.   Gastrointestinal: Negative.   Musculoskeletal: Negative.   Neurological: Negative.   Psychiatric/Behavioral: Negative.   All other systems reviewed and are negative.    PHYSICAL EXAM: VS:  BP 116/80 (BP Location: Right Arm, Patient Position: Sitting, Cuff Size: Normal)   Pulse 93   Ht 5\' 6"  (1.676 m)   Wt 225 lb (102.1 kg)   BMI 36.32 kg/m  , BMI Body mass index is 36.32 kg/m. GEN: Well nourished, well developed, in no acute distress  obese HEENT: normal  Neck: no JVD, carotid bruits, or masses Cardiac: RRR; no murmurs,  rubs, or gallops,no edema  Respiratory:  clear to auscultation bilaterally, normal work of breathing GI: soft, nontender, nondistended, + BS MS: no deformity or atrophy  Skin: warm and dry, no rash Neuro:  Strength and sensation are intact Psych: euthymic mood, full affect    Recent Labs: 08/06/2017: ALT 10; BUN 12; Creatinine, Ser 1.06; Potassium 4.5; Sodium 136 03/23/2018: TSH 0.53 05/04/2018: Hemoglobin 10.2; Platelets 255.0    Lipid Panel Lab Results  Component Value Date   CHOL 147 09/24/2016   HDL 32.00 (L) 09/24/2016   LDLCALC 84 09/24/2016   TRIG 153.0 (H) 09/24/2016      Wt Readings from Last 3 Encounters:  05/11/18 225 lb (102.1 kg)  03/23/18 225 lb (102.1 kg)  11/04/17 224 lb (101.6 kg)       ASSESSMENT AND PLAN:  Paroxysmal atrial fibrillation (HCC) - Plan: EKG 12-Lead Appears to be permanent atrial fibrillation now Off all antiarrhythmics Could be contributing to mild shortness of breath symptoms  Atherosclerosis of aorta (HCC) - Plan: EKG 12-Lead Cholesterol close to goal Recommended exercise program, weight loss  Other emphysema ([place]) Long smoking history, currently on inhalers Recommended  weight loss Followed by pulmonary, notes reviewed  GI bleed due to NSAIDs Denies any GI bleed on eliquis Will need to monitor closely  Tremors of nervous system Discussed dosing of her propranolol We did discuss potential to increased propranolol up to 60 mg extended release twice a day Other option would be 120 mg daily If she has tachycardia at nighttime we could add low-dose short acting propranolol  Depression, recurrent (HCC) Stable, recommended regular walking program  Fatigue Scheduled to have B-12, anemia may be playing a role Recommended regular walking program  Shortness of breath Multifactorial including COPD, obesity, deconditioning Atrial fibrillation Unable to exclude component of mild chronic diastolic CHF Recommended she start Lasix  sparingly for days when she has significant symptoms  Disposition:   F/U  12 months   Total encounter time more than 60 minutes  Greater than 50% was spent in counseling and coordination of care with the patient    Orders Placed This Encounter  Procedures  . EKG 12-Lead     Signed, Esmond Plants, M.D., Ph.D. 05/11/2018  [place], [place]

## 2018-05-11 ENCOUNTER — Ambulatory Visit (INDEPENDENT_AMBULATORY_CARE_PROVIDER_SITE_OTHER): Payer: Medicare Other | Admitting: Cardiovascular Disease

## 2018-05-11 ENCOUNTER — Encounter: Payer: Self-pay | Admitting: Cardiovascular Disease

## 2018-05-11 VITALS — BP 116/80 | HR 93 | Ht 66.0 in | Wt 225.0 lb

## 2018-05-11 DIAGNOSIS — R5382 Chronic fatigue, unspecified: Secondary | ICD-10-CM

## 2018-05-11 DIAGNOSIS — R251 Tremor, unspecified: Secondary | ICD-10-CM

## 2018-05-11 DIAGNOSIS — K922 Gastrointestinal hemorrhage, unspecified: Secondary | ICD-10-CM

## 2018-05-11 DIAGNOSIS — J438 Other emphysema: Secondary | ICD-10-CM | POA: Diagnosis not present

## 2018-05-11 DIAGNOSIS — F339 Major depressive disorder, recurrent, unspecified: Secondary | ICD-10-CM | POA: Diagnosis not present

## 2018-05-11 DIAGNOSIS — R0602 Shortness of breath: Secondary | ICD-10-CM

## 2018-05-11 DIAGNOSIS — I480 Paroxysmal atrial fibrillation: Secondary | ICD-10-CM

## 2018-05-11 DIAGNOSIS — T39395A Adverse effect of other nonsteroidal anti-inflammatory drugs [NSAID], initial encounter: Secondary | ICD-10-CM

## 2018-05-11 DIAGNOSIS — I700 Atherosclerosis of aorta: Secondary | ICD-10-CM

## 2018-05-11 MED ORDER — ELIQUIS 5 MG PO TABS: 5.0000 mg | ORAL_TABLET | Freq: Two times a day (BID) | ORAL | 3 refills | Status: DC

## 2018-05-11 MED ORDER — FUROSEMIDE 20 MG PO TABS: 20.0000 mg | ORAL_TABLET | Freq: Every day | ORAL | 3 refills | Status: DC | PRN

## 2018-05-11 MED ORDER — PROPRANOLOL HCL ER 80 MG PO CP24: 80.0000 mg | ORAL_CAPSULE | Freq: Every day | ORAL | 3 refills | Status: DC

## 2018-05-11 NOTE — Patient Instructions (Addendum)
Medication Instructions:   Take lasix/furosemide as needed for swelling, abd bloating  Labwork:  No new labs needed  Testing/Procedures:  No further testing at this time   Follow-Up: It was a pleasure seeing you in the office today. Please call us if you have new issues that need to be addressed before your next appt.  [phone]  Your physician wants you to follow-up in: 12 months.  You will receive a reminder letter in the mail two months in advance. If you don't receive a letter, please call our office to schedule the follow-up appointment.  If you need a refill on your cardiac medications before your next appointment, please call your pharmacy.  For educational health videos Log in to : [email or web address] Or : [email or web address], password : [email or web address]

## 2018-05-15 ENCOUNTER — Ambulatory Visit (INDEPENDENT_AMBULATORY_CARE_PROVIDER_SITE_OTHER): Payer: Medicare Other | Admitting: Family

## 2018-05-15 VITALS — BP 130/86 | HR 76 | Temp 98.6°F | Resp 18 | Wt 222.2 lb

## 2018-05-15 DIAGNOSIS — D509 Iron deficiency anemia, unspecified: Secondary | ICD-10-CM | POA: Diagnosis not present

## 2018-05-15 DIAGNOSIS — R202 Paresthesia of skin: Secondary | ICD-10-CM | POA: Diagnosis not present

## 2018-05-15 DIAGNOSIS — F339 Major depressive disorder, recurrent, unspecified: Secondary | ICD-10-CM | POA: Diagnosis not present

## 2018-05-15 DIAGNOSIS — G250 Essential tremor: Secondary | ICD-10-CM

## 2018-05-15 DIAGNOSIS — I480 Paroxysmal atrial fibrillation: Secondary | ICD-10-CM | POA: Diagnosis not present

## 2018-05-15 DIAGNOSIS — K22719 Barrett's esophagus with dysplasia, unspecified: Secondary | ICD-10-CM | POA: Diagnosis not present

## 2018-05-15 DIAGNOSIS — G629 Polyneuropathy, unspecified: Secondary | ICD-10-CM | POA: Diagnosis not present

## 2018-05-15 DIAGNOSIS — M545 Low back pain, unspecified: Secondary | ICD-10-CM

## 2018-05-15 DIAGNOSIS — R200 Anesthesia of skin: Secondary | ICD-10-CM

## 2018-05-15 DIAGNOSIS — R0602 Shortness of breath: Secondary | ICD-10-CM

## 2018-05-15 DIAGNOSIS — E538 Deficiency of other specified B group vitamins: Secondary | ICD-10-CM

## 2018-05-15 MED ORDER — CYANOCOBALAMIN 1000 MCG/ML IJ SOLN
1000.0000 ug | Freq: Once | INTRAMUSCULAR | Status: AC
  Administered 2018-05-15: 1000 ug via INTRAMUSCULAR

## 2018-05-15 MED ORDER — GABAPENTIN 300 MG PO CAPS: 300.0000 mg | ORAL_CAPSULE | Freq: Three times a day (TID) | ORAL | 3 refills | Status: DC

## 2018-05-15 MED ORDER — SERTRALINE HCL 50 MG PO TABS: 50.0000 mg | ORAL_TABLET | Freq: Every day | ORAL | 3 refills | Status: DC

## 2018-05-15 MED ORDER — CYANOCOBALAMIN 1000 MCG/ML IJ SOLN: INTRAMUSCULAR | 15 refills | Status: DC

## 2018-05-15 MED ORDER — CYCLOBENZAPRINE HCL 5 MG PO TABS: 5.0000 mg | ORAL_TABLET | Freq: Every day | ORAL | 1 refills | Status: DC

## 2018-05-15 NOTE — Assessment & Plan Note (Signed)
Controlled on gabapentin.  However discussed at length of her fatigue may be from 600 mg 3 times daily of gabapentin.  We  decrease this to 300 mg 3 times daily gabapentin.  I strongly advised her to be cautious with this medication.

## 2018-05-15 NOTE — Patient Instructions (Addendum)
Will give b12 today; do not start B12 until next week. Give yourself injecition once per week for 4 weeks, then monthly.   Stop lexapro  Start zoloft tomororw at bedtime.   Labs today.   Please bring stool cards back  Please make follow up with [facility]   Decrease gabapentin 300mg  three times per day; see if feel less tired.   Careful with flexeril  Do not drive or operate heavy machinery while on muscle relaxant. Please do not drink alcohol. Only take this medication as needed for acute muscle spasm at bedtime. This medication make you feel drowsy so be very careful.  Stop taking if become too drowsy or somnolent as this puts you at risk for falls. Please contact our office with any questions.    This is  Dr. Lupita Dawn  example of a  "Low GI"  Diet:  It will allow you to lose 4 to 8  lbs  per month if you follow it carefully.  Your goal with exercise is a minimum of 30 minutes of aerobic exercise 5 days per week (Walking does not count once it becomes easy!)    All of the foods can be found at grocery stores and in bulk at [employer].  The Atkins protein bars and shakes are available in more varieties at [employer], [employer] and [place].     7 AM Breakfast:  Choose from the following:  Low carbohydrate Protein  Shakes (I recommend the  Premier Protein chocolate shakes,  EAS AdvantEdge "Carb Control" shakes  Or the Atkins shakes all are under 3 net carbs)     a scrambled egg/bacon/cheese burrito made with Mission's "carb balance" whole wheat tortilla  (about 10 net carbs )  Regulatory affairs officer (basically a quiche without the pastry crust) that is eaten cold and very convenient way to get your eggs.  8 carbs)  If you make your own protein shakes, avoid bananas and pineapple,  And use low carb greek yogurt or original /unsweetened almond or soy milk   Avoid cereal and bananas, oatmeal and cream of wheat and grits. They are loaded with carbohydrates!   10 AM:  high protein snack:  Protein bar by Atkins (the snack size, under 200 cal, usually < 6 net carbs).    A stick of cheese:  Around 1 carb,  100 cal     Dannon Light n Fit [place] Yogurt  (80 cal, 8 carbs)  Other so called "protein bars" and Greek yogurts tend to be loaded with carbohydrates.  Remember, in food advertising, the word "energy" is synonymous for " carbohydrate."  Lunch:   A Sandwich using the bread choices listed, Can use any  Eggs,  lunchmeat, grilled meat or canned tuna), avocado, regular mayo/mustard  and cheese.  A Salad using blue cheese, ranch,  Goddess or vinagrette,  Avoid taco shells, croutons or "confetti" and no "candied nuts" but regular nuts OK.   No pretzels, nabs  or chips.  Pickles and miniature sweet peppers are a good low carb alternative that provide a "crunch"  The bread is the only source of carbohydrate in a sandwich and  can be decreased by trying some of the attached alternatives to traditional loaf bread   Avoid "Low fat dressings, as well as [place] dressings They are loaded with sugar!   3 PM/ Mid day  Snack:  Consider  1 ounce of  almonds, walnuts, pistachios, pecans, peanuts,  Macadamia  nuts or a nut medley.  Avoid "granola and granola bars "  Mixed nuts are ok in moderation as long as there are no raisins,  cranberries or dried fruit.   KIND bars are OK if you get the low glycemic index variety   Try the prosciutto/mozzarella cheese sticks by [employer]  In deli /backery section   High protein      6 PM  Dinner:     Meat/fowl/fish with a green salad, and either broccoli, cauliflower, green beans, spinach, brussel sprouts or  Lima beans. DO NOT BREAD THE PROTEIN!!      There is a low carb pasta by Dreamfield's that is acceptable and tastes great: only 5 digestible carbs/serving.( All grocery stores but BJs carry it ) Several ready made meals are available low carb:   Try Michel Angelo's chicken piccata or chicken or eggplant parm  over low carb pasta.(Lowes and BJs)   Marjory Lies Sanchez's "Carnitas" (pulled pork, no sauce,  0 carbs) or his beef pot roast to make a dinner burrito (at [employer])  Pesto over low carb pasta (bj's sells a good quality pesto in the center refrigerated section of the deli   Try satueeing  Cheral Marker with mushroooms as a good side   [employer] makes a mashed cauliflower that tastes like mashed potatoes  Whole wheat pasta is still full of digestible carbs and  Not as low in glycemic index as Dreamfield's.   Brown rice is still rice,  So skip the rice and noodles if you eat [place] or [place] (or at least limit to 1/2 cup)  9 PM snack :   Breyer's "low carb" fudgsicle or  ice cream bar (Carb Smart line), or  Weight Watcher's ice cream bar , or another "no sugar added" ice cream;  a serving of fresh berries/cherries with whipped cream   Cheese or DANNON'S LlGHT N FIT GREEK YOGURT  8 ounces of [place] unsweetened almond/cococunut milk    Treat yourself to a parfait made with whipped cream blueberiies, walnuts and vanilla greek yogurt  Avoid bananas, pineapple, grapes  and watermelon on a regular basis because they are high in sugar.  THINK OF THEM AS DESSERT  Remember that snack Substitutions should be less than 10 NET carbs per serving and meals < 20 carbs. Remember to subtract fiber grams to get the "net carbs."  @TULLOBREADPACKAGE @

## 2018-05-15 NOTE — Assessment & Plan Note (Signed)
Normocytic anemia.  Pleased to see some mild improvement.  Clinically fatigued, shortness of breath has improved.  Again I suspect multifactorial.  We will go ahead and start B12 injections today. pending further anemia studies.  She may be early iron deficient, at this time I advised her to stay on the iron however this may be something she may discontinue. Pending GI consult. Will consider hematology once all labs have resulted.  Close follow-up in 1 month.

## 2018-05-15 NOTE — Progress Notes (Signed)
Subjective:    Patient ID: Brandy Armstrong, female    DOB: 03-23-1946, 72 y.o.   MRN: [id number]  CC: [name] is a 72 y.o. female who presents today for follow up.   HPI: SOB little better . Fatigue 'much better.'  Eating lighter which helps. Thinks anemia and b12 are contributory as well.   Takes  iron pills when can remember.   Depression- Stopped wellbutrin, back on lexapro.  Feels as if anxiety is not well controlled.  Been on Klonopin in the past, and would like to consider this medication again.    Would like refill of flexeril, nexium  Lost weight - following low carb diet. Starting walking short distances since feeling less SOB. hasnt had to use lasi   Neuropathy- gabapentin works well.   GERD and Barretts esophagus- takes nexium daily, keeps symptoms. No trouble swallowing.  EGD 2016 didn't show barrett's esophagus.    Anemia-pending follow up with [facility]- [facility]  Patient was evaluated with Dr. Rockey Situ; afib- on eliquis, noted that she is off all antiarrhythmics could be contributing mild shortness of breath.  Potential changes to propanolol. Started lasix   H/o GIB  HISTORY:  Past Medical History:  Diagnosis Date  . Atrial fibrillation [facility])    a. s/p ablation 01/2012 in [facility] by Dr. Boyd Kerbs;  b. On sotalol & Xarelto;  c. 02/2014 Echo: EF 50-55%, mild conc LVH, nl LA size/structure;  d. Recurrent afib 8/15 & 08/29/2014.  . Barretts esophagus   . Chest pain    a. 02/2014 Myoview: Ef 50%, no ischemia.  . Colon polyps   . COPD (chronic obstructive pulmonary disease) ([place])   . COPD (chronic obstructive pulmonary disease) ([place])   . Depression with anxiety   . GERD (gastroesophageal reflux disease)   . Neuropathy   . Rectal fistula   . Rheumatoid arthritis ([place])    On methotrexate and orencia  . Urinary incontinence   . Vitamin D deficiency    Past Surgical History:  Procedure Laterality Date  . ABDOMINAL HYSTERECTOMY  1992  . CARDIAC  ELECTROPHYSIOLOGY STUDY AND ABLATION  2013  . CHOLECYSTECTOMY  1987  . COLONOSCOPY N/A 05/08/2015   Procedure: COLONOSCOPY;  Surgeon: Manya Silvas, MD;  Location: [facility] ENDOSCOPY;  Service: Endoscopy;  Laterality: N/A;  . ESOPHAGOGASTRODUODENOSCOPY N/A 05/06/2015   Procedure: ESOPHAGOGASTRODUODENOSCOPY (EGD);  Surgeon: Lollie Sails, MD;  Location: [facility] ENDOSCOPY;  Service: Endoscopy;  Laterality: N/A;  . oophrectomy Bilateral 1992   Family History  Problem Relation Age of Onset  . Depression Mother   . Cancer Mother 54       pancreatic cancer  . Cancer Father 28       colon cancer  . Cancer Sister 30       breast cancer  . Breast cancer Sister 77  . Diabetes Brother   . Neuropathy Neg Hx     Allergies: Latex Current Outpatient Medications on File Prior to Visit  Medication Sig Dispense Refill  . albuterol (PROVENTIL HFA;VENTOLIN HFA) 108 (90 Base) MCG/ACT inhaler Inhale 1-2 puffs into the lungs every 4 (four) hours as needed for wheezing or shortness of breath. 1 Inhaler 10  . budesonide-formoterol (SYMBICORT) 160-4.5 MCG/ACT inhaler INHALE 2 PUFFS TWO (2) TIMES A DAY. 10.2 Inhaler 1  . ELIQUIS 5 MG TABS tablet Take 1 tablet (5 mg total) by mouth 2 (two) times daily. 180 tablet 3  . esomeprazole (NEXIUM) 40 MG capsule TAKE ONE CAPSULE  BY MOUTH DAILY AT 12 NOON 30 capsule 0  . ferrous sulfate 325 (65 FE) MG EC tablet Take 325 mg by mouth daily with breakfast.    . furosemide (LASIX) 20 MG tablet Take 1 tablet (20 mg total) by mouth daily as needed. 30 tablet 3  . hydroxychloroquine (PLAQUENIL) 200 MG tablet Take 200 mg by mouth.    . lidocaine (XYLOCAINE) 5 % ointment Apply 1 application topically as needed.    . propranolol ER (INDERAL LA) 80 MG 24 hr capsule Take 1 capsule (80 mg total) by mouth daily. 90 capsule 3  . SPIRIVA RESPIMAT 2.5 MCG/ACT AERS INHALE 2 PUFFS BY MOUTH DAILY.  6  . Tofacitinib Citrate 5 MG TABS Take 5 mg by mouth.     No current facility-administered  medications on file prior to visit.     Social History   Tobacco Use  . Smoking status: Former Smoker    Packs/day: 1.00    Years: 40.00    Pack years: 40.00    Last attempt to quit: 12/16/2011    Years since quitting: 6.4  . Smokeless tobacco: Never Used  Substance Use Topics  . Alcohol use: Yes    Alcohol/week: 0.0 oz    Comment: Occasionally has a drink  . Drug use: No    Review of Systems  Constitutional: Positive for fatigue. Negative for chills, fever and unexpected weight change.  Respiratory: Positive for shortness of breath. Negative for cough.   Cardiovascular: Negative for chest pain and palpitations.  Gastrointestinal: Negative for nausea and vomiting.  Psychiatric/Behavioral: The patient is nervous/anxious.       Objective:    BP 130/86 (BP Location: Left Arm, Patient Position: Sitting, Cuff Size: Large)   Pulse 76   Temp 98.6 F (37 C) (Oral)   Resp 18   Wt 222 lb 3.2 oz (100.8 kg)   SpO2 96%   BMI 35.86 kg/m  BP Readings from Last 3 Encounters:  05/15/18 130/86  05/11/18 116/80  03/23/18 126/76   Wt Readings from Last 3 Encounters:  05/15/18 222 lb 3.2 oz (100.8 kg)  05/11/18 225 lb (102.1 kg)  03/23/18 225 lb (102.1 kg)    Physical Exam  Constitutional: She appears well-developed and well-nourished.  Eyes: Conjunctivae are normal.  Cardiovascular: Normal rate, regular rhythm, normal heart sounds and normal pulses.  Pulmonary/Chest: Effort normal and breath sounds normal. She has no wheezes. She has no rhonchi. She has no rales.  Neurological: She is alert.  Skin: Skin is warm and dry.  Psychiatric: She has a normal mood and affect. Her speech is normal and behavior is normal. Thought content normal.  Vitals reviewed.      Assessment & Plan:   Problem List Items Addressed This Visit      Cardiovascular and Mediastinum   Atrial fibrillation ([place])    On Eliquis.  Propranolol.  No palpitations.  Has recently seen Dr. Rockey Situ.  Will  follow.        Digestive   Barrett's esophagus    Advised that she should stay on PPI.  Again, advised that she follows up with GI for continued surveillance of Barrett's esophagus.        Nervous and Auditory   Neuropathy    Controlled on gabapentin.  However discussed at length of her fatigue may be from 600 mg 3 times daily of gabapentin.  We  decrease this to 300 mg 3 times daily gabapentin.  I strongly advised her to  be cautious with this medication.        Other   Shortness of breath    Improved. As discussed with patient, suspect shortness of breath, and fatigue multifactorial including depression, weight, deconditioning, B12 deficiency, COPD, atrial fib.  I strongly encouraged her to pursue follow-up with [name] and ensure that paperwork for financial assistance has been completed.  Patient verbalized understanding of this and states she will follow with GI.  I also reminded her to bring in the stool cards at this time,as we do not understand the full etiology of her anemia.      Numbness and tingling   Relevant Medications   gabapentin (NEURONTIN) 300 MG capsule   Anemia    Normocytic anemia.  Pleased to see some mild improvement.  Clinically fatigued, shortness of breath has improved.  Again I suspect multifactorial.  We will go ahead and start B12 injections today. pending further anemia studies.  She may be early iron deficient, at this time I advised her to stay on the iron however this may be something she may discontinue. Pending GI consult. Will consider hematology once all labs have resulted.  Close follow-up in 1 month.      Relevant Medications   cyanocobalamin (,VITAMIN B-12,) 1000 MCG/ML injection   cyanocobalamin ((VITAMIN B-12)) injection 1,000 mcg (Completed)   Depression, recurrent (HCC)    Worsening anxiety.  Discontinue Lexapro, start Zoloft .      Relevant Medications   sertraline (ZOLOFT) 50 MG tablet    Other Visit Diagnoses    B12 deficiency     -  Primary   Relevant Medications   cyanocobalamin (,VITAMIN B-12,) 1000 MCG/ML injection   cyanocobalamin ((VITAMIN B-12)) injection 1,000 mcg (Completed)   Other Relevant Orders   Vitamin D (25 hydroxy)   Iron, TIBC and Ferritin Panel   Essential tremor       Bilateral low back pain without sciatica       Relevant Medications   cyclobenzaprine (FLEXERIL) 5 MG tablet   Other Relevant Orders   Vitamin D (25 hydroxy)       I have discontinued Bethanne C. Pitzer's gabapentin and escitalopram. I have also changed her cyclobenzaprine. Additionally, I am having her start on gabapentin, cyanocobalamin, and sertraline. Lastly, I am having her maintain her SPIRIVA RESPIMAT, hydroxychloroquine, lidocaine, budesonide-formoterol, Tofacitinib Citrate, albuterol, esomeprazole, ferrous sulfate, furosemide, ELIQUIS, and propranolol ER. We administered cyanocobalamin.   Meds ordered this encounter  Medications  . cyclobenzaprine (FLEXERIL) 5 MG tablet    Sig: Take 1 tablet (5 mg total) by mouth at bedtime.    Dispense:  30 tablet    Refill:  1    Order Specific Question:   Supervising Provider    Answer:   Deborra Medina L [[id number]]  . gabapentin (NEURONTIN) 300 MG capsule    Sig: Take 1 capsule (300 mg total) by mouth 3 (three) times daily.    Dispense:  60 capsule    Refill:  3    Order Specific Question:   Supervising Provider    Answer:   Deborra Medina L [[id number]]  . cyanocobalamin (,VITAMIN B-12,) 1000 MCG/ML injection    Sig: 1000 mcg (1 mg) injection once per week for four weeks, followed by 1000 mcg injection once per month.    Dispense:  1 mL    Refill:  15    Order Specific Question:   Supervising Provider    Answer:   Deborra Medina L [[id number]]  . sertraline (  ZOLOFT) 50 MG tablet    Sig: Take 1 tablet (50 mg total) by mouth at bedtime.    Dispense:  90 tablet    Refill:  3    Order Specific Question:   Supervising Provider    Answer:   Deborra Medina L [[id number]]  . cyanocobalamin ((VITAMIN  B-12)) injection 1,000 mcg    Return precautions given.   Risks, benefits, and alternatives of the medications and treatment plan prescribed today were discussed, and patient expressed understanding.   Education regarding symptom management and diagnosis given to patient on AVS.  Continue to follow with [name], FNP for routine health maintenance.   [name] and I agreed with plan.   Mable Paris, FNP

## 2018-05-15 NOTE — Assessment & Plan Note (Signed)
Worsening anxiety.  Discontinue Lexapro, start Zoloft .

## 2018-05-15 NOTE — Assessment & Plan Note (Signed)
Advised that she should stay on PPI.  Again, advised that she follows up with GI for continued surveillance of Barrett's esophagus.

## 2018-05-15 NOTE — Assessment & Plan Note (Addendum)
Improved. As discussed with patient, suspect shortness of breath, and fatigue multifactorial including depression, weight, deconditioning, B12 deficiency, COPD, atrial fib.  I strongly encouraged her to pursue follow-up with [name] and ensure that paperwork for financial assistance has been completed.  Patient verbalized understanding of this and states she will follow with GI.  I also reminded her to bring in the stool cards at this time,as we do not understand the full etiology of her anemia.

## 2018-05-15 NOTE — Assessment & Plan Note (Signed)
On Eliquis.  Propranolol.  No palpitations.  Has recently seen Dr. Rockey Situ.  Will follow.

## 2018-05-16 LAB — IRON,TIBC AND FERRITIN PANEL
%SAT: 89 % (calc) — ABNORMAL HIGH (ref 11–50)
FERRITIN: 26 ng/mL (ref 20–288)
IRON: 346 ug/dL — AB (ref 45–160)
TIBC: 387 ug/dL (ref 250–450)

## 2018-05-16 LAB — VITAMIN D 25 HYDROXY (VIT D DEFICIENCY, FRACTURES): VIT D 25 HYDROXY: 15 ng/mL — AB (ref 30–100)

## 2018-05-18 ENCOUNTER — Encounter: Payer: Self-pay | Admitting: Family

## 2018-05-20 ENCOUNTER — Encounter: Payer: Self-pay | Admitting: Family

## 2018-05-20 ENCOUNTER — Telehealth: Payer: Self-pay | Admitting: Family

## 2018-05-20 DIAGNOSIS — D649 Anemia, unspecified: Secondary | ICD-10-CM

## 2018-05-20 NOTE — Telephone Encounter (Signed)
Copied from [place]. Topic: Quick Communication - Lab Results >> May 20, 2018 10:11 AM Robina Ade, Helene Kelp D wrote: Patient called to check-up on her lab results from Friday. Patient would like them release to mychart thanks.

## 2018-05-20 NOTE — Telephone Encounter (Signed)
Sent mychart message with results

## 2018-05-20 NOTE — Telephone Encounter (Signed)
FYI patient , see below message  Patient is having pain in center of stomach , squeezing type of pain no sure of location unable to tell where if near esophagus. No trouble swallowing, eatting, no shortness of breath hx of COPD .   See below message.

## 2018-05-20 NOTE — Telephone Encounter (Signed)
Please advise 

## 2018-05-20 NOTE — Telephone Encounter (Signed)
Have you reviewed results ?

## 2018-05-20 NOTE — Telephone Encounter (Signed)
Pt is very concerned about her iron levels being high. She has looked up on line what high iron can cause and she would like to know what [name] thinks needs to be done. Pt states she has been having pain upper part of stomach, or liver. She thought it was just gas and she has been on a diet. Her mother did have pancreatic cancer.

## 2018-05-22 MED ORDER — "SYRINGE 25G X 5/8"" 3 ML MISC": 0 refills | Status: DC

## 2018-05-22 NOTE — Telephone Encounter (Signed)
Patient advised of below and verbalized understanding.   She states she needs Syringes for B12 due to she will be administering B12 injections weekly then monthly .

## 2018-05-22 NOTE — Telephone Encounter (Signed)
Call pt Please triage abdominal pain; is this acute? Does passing  Gas improve pain? Is yes, she needs to go to [facility] or ED.   In regards to anemia and elevated iron stores , please advise that I have sent a referral to hematology. I too was perplexed as to how her iron was high .[name] And she is not on supplemental iron.  Has she made an appt with GI Yet??   Please sent me a note if she hasnt heard from [place] in a week with hematology appt.

## 2018-05-22 NOTE — Telephone Encounter (Signed)
Agree with advice If pain recurs, she needs to be evaluated UC, ED  She may decrease the zoloft to 25mg  ( split the tablet). Ensure she takes at night.   I suspect gabapentin is making her drowsy.. propranolol and flexeril are also sedating. She needs to stay on the propranolol for a fib unless cardiology says she could decrease dose  However for now, I would use gabapentin less, use flexeril only prn, decrease zoloft. She may also stop the zoloft if 25mg  doesn't improve sleepiness. Fatigue may also be related too her anemia.. Which is why she is seeing hematology.

## 2018-05-22 NOTE — Telephone Encounter (Signed)
Patient advised of referral to hematology  Abdominal pain, happens when she is moving walking,.   Not in intestinal region, pain was worse two days ago.  Around diaphram area.  Taking bra off reliefs pain.  Not in relation to passing gas , she does have irritable bowel syndrome  regular bowel movement.  Denies chest pain, no shortness of breath, arm numbness.  She isn't taking Nexium daily will try to taking on daily basis.   Feels like pain is in relation to muscle  , then mentions heart area , and back hurts when she stands that when she experiences pain , symptoms aren't present now only when she moves .  Advised patient if symptoms reoccur to seek treatment.    She is states Zoloft is making her sleepy tried taking at night and she is sleepy all throughtout day and drowsy.   When she went to pharmacy was only able to get 1  syringe.

## 2018-05-27 ENCOUNTER — Encounter: Payer: Self-pay | Admitting: Family

## 2018-05-27 NOTE — Telephone Encounter (Signed)
What diagnosis code do you want to use COPD , low back pain

## 2018-05-28 NOTE — Telephone Encounter (Signed)
Note placed in your folder for signature

## 2018-05-29 ENCOUNTER — Telehealth: Payer: Self-pay | Admitting: Pulmonary Disease

## 2018-05-29 ENCOUNTER — Ambulatory Visit
Admission: RE | Admit: 2018-05-29 | Discharge: 2018-05-29 | Disposition: A | Discharge status: Home / Self Care | Payer: Medicare Other | Source: Ambulatory Visit | Attending: Family | Admitting: Family

## 2018-05-29 DIAGNOSIS — Z1239 Encounter for other screening for malignant neoplasm of breast: Secondary | ICD-10-CM

## 2018-05-29 DIAGNOSIS — Z1231 Encounter for screening mammogram for malignant neoplasm of breast: Secondary | ICD-10-CM | POA: Insufficient documentation

## 2018-05-29 MED ORDER — BUDESONIDE-FORMOTEROL FUMARATE 160-4.5 MCG/ACT IN AERO: INHALATION_SPRAY | RESPIRATORY_TRACT | 1 refills | Status: DC

## 2018-05-29 MED ORDER — SPIRIVA RESPIMAT 2.5 MCG/ACT IN AERS: INHALATION_SPRAY | RESPIRATORY_TRACT | 1 refills | Status: DC

## 2018-05-29 NOTE — Telephone Encounter (Signed)
RXs sent to [facility]. Nothing further needed.

## 2018-05-29 NOTE — Telephone Encounter (Signed)
Called and advised patient note ready for pick up . Placed up front.

## 2018-05-29 NOTE — Telephone Encounter (Signed)
*  STAT* If patient is at the pharmacy, call can be transferred to refill team.   1. Which medications need to be refilled? (please list name of each medication and dose if known) spiriva and symbicort  2. Which pharmacy/location (including street and city if local pharmacy) is medication to be sent to? [facility]  3. Do they need a 30 day or 90 day supply? [place]

## 2018-06-01 ENCOUNTER — Other Ambulatory Visit: Payer: Self-pay | Admitting: Family

## 2018-06-01 DIAGNOSIS — R928 Other abnormal and inconclusive findings on diagnostic imaging of breast: Secondary | ICD-10-CM

## 2018-06-01 DIAGNOSIS — N6489 Other specified disorders of breast: Secondary | ICD-10-CM

## 2018-06-02 ENCOUNTER — Inpatient Hospital Stay: Payer: Medicare Other | Attending: Oncology | Admitting: Oncology

## 2018-06-02 ENCOUNTER — Encounter: Payer: Self-pay | Admitting: Oncology

## 2018-06-02 ENCOUNTER — Inpatient Hospital Stay: Payer: Medicare Other

## 2018-06-02 ENCOUNTER — Other Ambulatory Visit: Payer: Self-pay

## 2018-06-02 VITALS — BP 124/79 | HR 83 | Temp 96.3°F | Ht 66.0 in | Wt 219.3 lb

## 2018-06-02 DIAGNOSIS — Z7901 Long term (current) use of anticoagulants: Secondary | ICD-10-CM | POA: Diagnosis not present

## 2018-06-02 DIAGNOSIS — R790 Abnormal level of blood mineral: Secondary | ICD-10-CM

## 2018-06-02 DIAGNOSIS — Z86010 Personal history of colonic polyps: Secondary | ICD-10-CM | POA: Diagnosis not present

## 2018-06-02 DIAGNOSIS — I4891 Unspecified atrial fibrillation: Secondary | ICD-10-CM | POA: Insufficient documentation

## 2018-06-02 DIAGNOSIS — E538 Deficiency of other specified B group vitamins: Secondary | ICD-10-CM | POA: Diagnosis not present

## 2018-06-02 DIAGNOSIS — D649 Anemia, unspecified: Secondary | ICD-10-CM

## 2018-06-02 DIAGNOSIS — D509 Iron deficiency anemia, unspecified: Secondary | ICD-10-CM

## 2018-06-02 LAB — COMPREHENSIVE METABOLIC PANEL
ALBUMIN: 4 g/dL (ref 3.5–5.0)
ALT: 15 U/L (ref 14–54)
ANION GAP: 10 (ref 5–15)
AST: 37 U/L (ref 15–41)
Alkaline Phosphatase: 92 U/L (ref 38–126)
BUN: 16 mg/dL (ref 6–20)
CHLORIDE: 102 mmol/L (ref 101–111)
CO2: 23 mmol/L (ref 22–32)
Calcium: 9.5 mg/dL (ref 8.9–10.3)
Creatinine, Ser: 0.97 mg/dL (ref 0.44–1.00)
GFR calc Af Amer: >60 mL/min (ref >60)
GFR calc non Af Amer: 58 mL/min — ABNORMAL LOW (ref >60)
GLUCOSE: 89 mg/dL (ref 65–99)
Potassium: 3.8 mmol/L (ref 3.5–5.1)
SODIUM: 135 mmol/L (ref 135–145)
Total Bilirubin: 0.5 mg/dL (ref 0.3–1.2)
Total Protein: 7.6 g/dL (ref 6.5–8.1)

## 2018-06-02 LAB — URINALYSIS, COMPLETE (UACMP) WITH MICROSCOPIC
BACTERIA UA: NONE SEEN
Bilirubin Urine: NEGATIVE
GLUCOSE, UA: NEGATIVE mg/dL
HGB URINE DIPSTICK: NEGATIVE
Ketones, ur: NEGATIVE mg/dL
NITRITE: NEGATIVE
Protein, ur: NEGATIVE mg/dL
Specific Gravity, Urine: 1.008 (ref 1.005–1.030)
pH: 6 (ref 5.0–8.0)

## 2018-06-02 LAB — TECHNOLOGIST SMEAR REVIEW

## 2018-06-02 LAB — CBC WITH DIFFERENTIAL/PLATELET
Basophils Absolute: 0 10*3/uL (ref 0–0.1)
Basophils Relative: 1 %
Eosinophils Absolute: 0 10*3/uL (ref 0–0.7)
Eosinophils Relative: 1 %
HCT: 33.8 % — ABNORMAL LOW (ref 35.0–47.0)
Hemoglobin: 11 g/dL — ABNORMAL LOW (ref 12.0–16.0)
LYMPHS ABS: 1.2 10*3/uL (ref 1.0–3.6)
LYMPHS PCT: 26 %
MCH: 27.6 pg (ref 26.0–34.0)
MCHC: 32.5 g/dL (ref 32.0–36.0)
MCV: 84.8 fL (ref 80.0–100.0)
MONO ABS: 0.5 10*3/uL (ref 0.2–0.9)
MONOS PCT: 11 %
Neutro Abs: 2.8 10*3/uL (ref 1.4–6.5)
Neutrophils Relative %: 61 %
PLATELETS: 248 10*3/uL (ref 150–440)
RBC: 3.99 MIL/uL (ref 3.80–5.20)
RDW: 21.8 % — AB (ref 11.5–14.5)
WBC: 4.6 10*3/uL (ref 3.6–11.0)

## 2018-06-02 LAB — IRON AND TIBC
Iron: 54 ug/dL (ref 28–170)
Saturation Ratios: 12 % (ref 10.4–31.8)
TIBC: 437 ug/dL (ref 250–450)
UIBC: 383 ug/dL

## 2018-06-02 LAB — FERRITIN: FERRITIN: 17 ng/mL (ref 11–307)

## 2018-06-02 LAB — RETICULOCYTES
RBC.: 3.96 MIL/uL (ref 3.80–5.20)
RETIC COUNT ABSOLUTE: 51.5 10*3/uL (ref 19.0–183.0)
RETIC CT PCT: 1.3 % (ref 0.4–3.1)

## 2018-06-02 NOTE — Progress Notes (Signed)
Patient here today as a new patient with anemia  

## 2018-06-02 NOTE — Progress Notes (Signed)
Hematology/Oncology Consult note [facility] Telephone:([phone] [phone]   Patient Care Team: [name], FNP as PCP - General (Family Medicine)  REFERRING PROVIDER: [name], FNP  CHIEF COMPLAINTS/PURPOSE OF CONSULTATION:  Evaluation of anemia and elevated iron level.   HISTORY OF PRESENTING ILLNESS:  Brandy Armstrong is a  72 y.o.  female with PMH listed below who was referred to me for evaluation of anemia and elevated iron level.  Patient recently had lab work done which revealed anemia with hemoglobin count at 10.2 on 05/04/2018.  I reviewed patient's previous labs, Anemia is chronic, dated back to 2014. She recalls having 1 PRBC transfusion in the past and was told she has iron deficiency anemia. She takes oral OTC  iron supplements and anemia has improved.  Associated with fatigue, lack of energy. Denies weight loss, easy bruising, hematochezia, hemoptysis.  Last colonoscopy 2016 revealed polyps which were removed. No malignancy was found.  Last EGD: 2016 which showed gastritis.   Reviewed recent iron panel which showed elevated iron at 346, TSAT elevated at 89, ferritin 26. Denies any family history of hemachromatosis.  Recent B12 level showed decreased B12, she reports that she has been started on parental B12 injections with her PCP.  History RF, she takes plaquenil 200mg  daily.  History of Afib, takes Elqiuis 5mg  BID.   Review of Systems  Constitutional: Positive for malaise/fatigue. Negative for chills, fever and weight loss.  HENT: Negative for congestion, ear discharge, ear pain, nosebleeds, sinus pain and sore throat.   Eyes: Negative for double vision, photophobia, pain, discharge and redness.  Respiratory: Negative for cough, hemoptysis, sputum production, shortness of breath and wheezing.   Cardiovascular: Negative for chest pain, palpitations, orthopnea, claudication and leg swelling.  Gastrointestinal: Negative  for abdominal pain, blood in stool, constipation, diarrhea, heartburn, melena, nausea and vomiting.  Genitourinary: Negative for dysuria, flank pain, frequency and hematuria.  Musculoskeletal: Negative for back pain, myalgias and neck pain.  Skin: Negative for itching and rash.  Neurological: Negative for dizziness, tingling, tremors, focal weakness, weakness and headaches.  Endo/Heme/Allergies: Negative for environmental allergies. Does not bruise/bleed easily.  Psychiatric/Behavioral: Negative for depression and hallucinations. The patient is not nervous/anxious.     MEDICAL HISTORY:  Past Medical History:  Diagnosis Date  . Atrial fibrillation [facility])    a. s/p ablation 01/2012 [facility] by Dr. Boyd Kerbs;  b. On sotalol & Xarelto;  c. 02/2014 Echo: EF 50-55%, mild conc LVH, nl LA size/structure;  d. Recurrent afib 8/15 & 08/29/2014.  . Barretts esophagus   . Chest pain    a. 02/2014 Myoview: Ef 50%, no ischemia.  . Colon polyps   . COPD (chronic obstructive pulmonary disease) ([place])   . COPD (chronic obstructive pulmonary disease) ([place])   . Depression with anxiety   . GERD (gastroesophageal reflux disease)   . Neuropathy   . Rectal fistula   . Rheumatoid arthritis ([place])    On methotrexate and orencia  . Urinary incontinence   . Vitamin D deficiency     SURGICAL HISTORY: Past Surgical History:  Procedure Laterality Date  . ABDOMINAL HYSTERECTOMY  1992  . CARDIAC ELECTROPHYSIOLOGY STUDY AND ABLATION  2013  . CHOLECYSTECTOMY  1987  . COLONOSCOPY N/A 05/08/2015   Procedure: COLONOSCOPY;  Surgeon: Manya Silvas, MD;  Location: [facility] ENDOSCOPY;  Service: Endoscopy;  Laterality: N/A;  . ESOPHAGOGASTRODUODENOSCOPY N/A 05/06/2015   Procedure: ESOPHAGOGASTRODUODENOSCOPY (EGD);  Surgeon: Lollie Sails, MD;  Location[facility]  ENDOSCOPY;  Service: Endoscopy;  Laterality: N/A;  . OOPHORECTOMY    . oophrectomy Bilateral 1992    SOCIAL HISTORY: Social History   Socioeconomic History  .  Marital status: Single    Spouse name: Not on file  . Number of children: 0  . Years of education: 27  . Highest education level: Not on file  Occupational History  . Occupation: Retired  Scientific laboratory technician  . Financial resource strain: Not on file  . Food insecurity:    Worry: Not on file    Inability: Not on file  . Transportation needs:    Medical: Not on file    Non-medical: Not on file  Tobacco Use  . Smoking status: Former Smoker    Packs/day: 1.00    Years: 40.00    Pack years: 40.00    Last attempt to quit: 12/16/2011    Years since quitting: 6.4  . Smokeless tobacco: Never Used  Substance and Sexual Activity  . Alcohol use: Not Currently    Alcohol/week: 0.0 oz    Comment: Occasionally has a drink  . Drug use: No  . Sexual activity: Never  Lifestyle  . Physical activity:    Days per week: Not on file    Minutes per session: Not on file  . Stress: Not on file  Relationships  . Social connections:    Talks on phone: Not on file    Gets together: Not on file    Attends religious service: Not on file    Active member of club or organization: Not on file    Attends meetings of clubs or organizations: Not on file    Relationship status: Not on file  . Intimate partner violence:    Fear of current or ex partner: Not on file    Emotionally abused: Not on file    Physically abused: Not on file    Forced sexual activity: Not on file  Other Topics Concern  . Not on file  Social History Narrative   [name] was born and reared in [place]. She attended [employer] and graduated in 1971 with her [place] in Education. 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 She then worked for a [employer] in [place]. She is single. She is currently retired. She loves reading. She loves to spend time with her dog.       Caffeine use: 2 cups coffee per day   2 glasses iced tea/ day   Right-handed    FAMILY  HISTORY: Family History  Problem Relation Age of Onset  . Depression Mother   . Pancreatic cancer Mother   . Colon cancer Father   . Breast cancer Sister 39  . Diabetes Brother   . Breast cancer Cousin   . Neuropathy Neg Hx     ALLERGIES:  is allergic to latex.  MEDICATIONS:  Current Outpatient Medications  Medication Sig Dispense Refill  . albuterol (PROVENTIL HFA;VENTOLIN HFA) 108 (90 Base) MCG/ACT inhaler Inhale 1-2 puffs into the lungs every 4 (four) hours as needed for wheezing or shortness of breath. 1 Inhaler 10  . budesonide-formoterol (SYMBICORT) 160-4.5 MCG/ACT inhaler INHALE 2 PUFFS TWO (2) TIMES A DAY. 3 Inhaler 1  . cyanocobalamin (,VITAMIN B-12,) 1000 MCG/ML injection 1000 mcg (1 mg) injection once per week for four weeks, followed by 1000 mcg injection once per month. 1 mL 15  . cyclobenzaprine (FLEXERIL) 5 MG tablet Take 1  tablet (5 mg total) by mouth at bedtime. 30 tablet 1  . ELIQUIS 5 MG TABS tablet Take 1 tablet (5 mg total) by mouth 2 (two) times daily. 180 tablet 3  . esomeprazole (NEXIUM) 40 MG capsule TAKE ONE CAPSULE BY MOUTH DAILY AT 12 NOON 30 capsule 0  . furosemide (LASIX) 20 MG tablet Take 1 tablet (20 mg total) by mouth daily as needed. 30 tablet 3  . gabapentin (NEURONTIN) 300 MG capsule Take 1 capsule (300 mg total) by mouth 3 (three) times daily. 60 capsule 3  . hydroxychloroquine (PLAQUENIL) 200 MG tablet Take 200 mg by mouth.    . lidocaine (XYLOCAINE) 5 % ointment Apply 1 application topically as needed.    . propranolol ER (INDERAL LA) 80 MG 24 hr capsule Take 1 capsule (80 mg total) by mouth daily. 90 capsule 3  . sertraline (ZOLOFT) 50 MG tablet Take 1 tablet (50 mg total) by mouth at bedtime. 90 tablet 3  . SPIRIVA RESPIMAT 2.5 MCG/ACT AERS 2 PUFFS BY MOUTH DAILY. 3 Inhaler 1  . Syringe/Needle, Disp, (SYRINGE 3CC/25GX5/8") 25G X 5/8" 3 ML MISC Use as directed 5 each 0  . Tofacitinib Citrate 5 MG TABS Take 5 mg by mouth.     No current  facility-administered medications for this visit.      PHYSICAL EXAMINATION: ECOG PERFORMANCE STATUS: 1 - Symptomatic but completely ambulatory Vitals:   06/02/18 1602  BP: 124/79  Pulse: 83  Temp: (!) 96.3 F (35.7 C)   Filed Weights   06/02/18 1602  Weight: 219 lb 5 oz (99.5 kg)    Physical Exam  Constitutional: She is oriented to person, place, and time. She appears well-developed and well-nourished. No distress.  HENT:  Head: Normocephalic and atraumatic.  Right Ear: External ear normal.  Left Ear: External ear normal.  Mouth/Throat: Oropharynx is clear and moist.  Eyes: Pupils are equal, round, and reactive to light. Conjunctivae and EOM are normal. No scleral icterus.  Neck: Normal range of motion. Neck supple.  Cardiovascular: Normal rate, regular rhythm and normal heart sounds.  Pulmonary/Chest: Effort normal and breath sounds normal. No respiratory distress. She has no wheezes. She has no rales. She exhibits no tenderness.  Abdominal: Soft. Bowel sounds are normal. She exhibits no distension and no mass. There is no tenderness.  Musculoskeletal: Normal range of motion. She exhibits no edema or deformity.  Lymphadenopathy:    She has no cervical adenopathy.  Neurological: She is alert and oriented to person, place, and time. No cranial nerve deficit. Coordination normal.  Skin: Skin is warm and dry. No rash noted.  Psychiatric: She has a normal mood and affect. Her behavior is normal. Thought content normal.     LABORATORY DATA:  I have reviewed the data as listed Lab Results  Component Value Date   WBC 4.6 06/02/2018   HGB 11.0 (L) 06/02/2018   HCT 33.8 (L) 06/02/2018   MCV 84.8 06/02/2018   PLT 248 06/02/2018   Recent Labs    08/06/17 1330 06/02/18 1052  NA 136 135  K 4.5 3.8  CL 100 102  CO2 31 23  GLUCOSE 114* 89  BUN 12 16  CREATININE 1.06 0.97  CALCIUM 9.3 9.5  GFRNONAA  --  58*  GFRAA  --  >60  PROT 7.0 7.6  ALBUMIN 3.7 4.0  AST 22 37    ALT 10 15  ALKPHOS 98 92  BILITOT 0.5 0.5       ASSESSMENT &  PLAN:  1. Anemia, unspecified type   2. B12 deficiency   3. Abnormal iron saturation   4. Iron deficiency anemia, unspecified iron deficiency anemia type   5. Chronic anticoagulation    Anemia: multifactorial with possible causes including chronic blood loss, hyper/hypothyroidism, nutritional deficiency, infection/chronic inflammation, hemolysis, underlying bone marrow disorders, chronic inflammation.  She has had lab work up by her PCP and I reviewed results with her.  Normal TSH, low B12, iron panel showed signs of overload.   Will repeat CBC check CMP,  Folate, i reticulocytes, fecal occult, blood smear, monoclonal gammopathy evaluation.  Check UA Iron panel does not make sense with low ferritin, high TSAT. Will repeat iron panel again.  Repeat Iron panel reviewed by me. Showed high TIBC at 437, borderline TSAT at 12, ferritin is at 17, consistent with functional iron deficiency Plan iron supplementation.   # B12 deficiency. Since she has been started on B12 injections, I will not check MMA. Will treat her presumed B12 deficiency which can contribute to her anemia.  Check intrinsic factor antibody and antiparietal antibody.    All questions were answered. The patient knows to call the clinic with any problems questions or concerns.  Return of visit: 1-2 weeks to discuss lab test.  Thank you for this kind referral and the opportunity to participate in the care of this patient. A copy of today's note is routed to referring provider  Total face to face encounter time for this patient visit was 45 min. >50% of the time was  spent in counseling and coordination of care.    Earlie Server, MD, PhD Hematology Oncology [facility] at [facility] Pager- [phone] 06/02/2018

## 2018-06-03 ENCOUNTER — Encounter: Admit: 2018-06-03 | Discharge: 2018-06-04 | Payer: MEDICARE

## 2018-06-03 DIAGNOSIS — M0579 Rheumatoid arthritis with rheumatoid factor of multiple sites without organ or systems involvement: Principal | ICD-10-CM

## 2018-06-03 LAB — KAPPA/LAMBDA LIGHT CHAINS
Kappa free light chain: 37 mg/L — ABNORMAL HIGH (ref 3.3–19.4)
Kappa, lambda light chain ratio: 1.5 (ref 0.26–1.65)
Lambda free light chains: 24.6 mg/L (ref 5.7–26.3)

## 2018-06-03 LAB — HOMOCYSTEINE: Homocysteine: <3 umol/L (ref 0.0–15.0)

## 2018-06-03 LAB — INTRINSIC FACTOR ANTIBODIES: Intrinsic Factor: 1 AU/mL (ref 0.0–1.1)

## 2018-06-03 LAB — ANTINUCLEAR ANTIBODIES, IFA: ANA Ab, IFA: NEGATIVE

## 2018-06-04 LAB — MULTIPLE MYELOMA PANEL, SERUM
Albumin SerPl Elph-Mcnc: 3.8 g/dL (ref 2.9–4.4)
Albumin/Glob SerPl: 1.1 (ref 0.7–1.7)
Alpha 1: 0.3 g/dL (ref 0.0–0.4)
Alpha2 Glob SerPl Elph-Mcnc: 0.7 g/dL (ref 0.4–1.0)
B-GLOBULIN SERPL ELPH-MCNC: 1.3 g/dL (ref 0.7–1.3)
GAMMA GLOB SERPL ELPH-MCNC: 1.4 g/dL (ref 0.4–1.8)
GLOBULIN, TOTAL: 3.6 g/dL (ref 2.2–3.9)
IGA: 597 mg/dL — AB (ref 87–352)
IgG (Immunoglobin G), Serum: 1085 mg/dL (ref 700–1600)
IgM (Immunoglobulin M), Srm: 200 mg/dL (ref 26–217)
Total Protein ELP: 7.4 g/dL (ref 6.0–8.5)

## 2018-06-04 LAB — ANTI-PARIETAL ANTIBODY: PARIETAL CELL ANTIBODY-IGG: 46 U — AB (ref 0.0–20.0)

## 2018-06-05 ENCOUNTER — Telehealth: Payer: Self-pay | Admitting: *Deleted

## 2018-06-05 NOTE — Telephone Encounter (Signed)
Patient called and states her test results are not in My CHart and she wants someone to call her with results    Ref Range & Units 3d ago  Intrinsic Factor 0.0 - 1.1 AU/mL 1.0   Comment: (NOTE)  Performed At: [facility]  [place], [place] [id number]  Rush Farmer MD [id number]  Performed at [facility], [address] [place], [place]  [address]   Resulting Agency  [facility]      Specimen Collected: 06/02/18 10:52 Last Resulted: 06/03/18 14:37        Other Results from 06/02/2018    Ref Range & Units 3d ago  WBC 3.6 - 11.0 K/uL 4.6   RBC 3.80 - 5.20 MIL/uL 3.99   Hemoglobin 12.0 - 16.0 g/dL 11.0Low    HCT 35.0 - 47.0 % 33.8Low    MCV 80.0 - 100.0 fL 84.8   MCH 26.0 - 34.0 pg 27.6   MCHC 32.0 - 36.0 g/dL 32.5   RDW 11.5 - 14.5 % 21.8High    Platelets 150 - 440 K/uL 248   Neutrophils Relative % % 61   Neutro Abs 1.4 - 6.5 K/uL 2.8   Lymphocytes Relative % 26   Lymphs Abs 1.0 - 3.6 K/uL 1.2   Monocytes Relative % 11   Monocytes Absolute 0.2 - 0.9 K/uL 0.5   Eosinophils Relative % 1   Eosinophils Absolute 0 - 0.7 K/uL 0.0   Basophils Relative % 1   Basophils Absolute 0 - 0.1 K/uL 0.0   Comment: Performed at [facility], [address] [place], [place] [address]  Resulting Agency  [facility] LAB      Specimen Collected: 06/02/18 11:24 Last Resulted: 06/02/18 11:30        Ref Range & Units 3d ago 64yrago  Color, Urine YELLOW YELLOWAbnormal   STRAWAbnormal    APPearance CLEAR CLEARAbnormal   CLEARAbnormal    Specific Gravity, Urine 1.005 - 1.030 1.008  1.004Low    pH 5.0 - 8.0 6.0  6.0   Glucose, UA NEGATIVE mg/dL NEGATIVE  NEGATIVE   Hgb urine dipstick NEGATIVE NEGATIVE  NEGATIVE   Bilirubin Urine NEGATIVE NEGATIVE  NEGATIVE   Ketones, ur NEGATIVE mg/dL NEGATIVE  NEGATIVE   Protein, ur NEGATIVE mg/dL NEGATIVE  NEGATIVE   Nitrite NEGATIVE NEGATIVE  NEGATIVE   Leukocytes, UA NEGATIVE SMALLAbnormal   NEGATIVE   RBC / HPF 0  - 5 RBC/hpf 0-5  NONE SEEN   WBC, UA 0 - 5 WBC/hpf 0-5  0-5   Bacteria, UA NONE SEEN NONE SEEN  NONE SEEN   Squamous Epithelial / LPF 0 - 5 0-5  0-5Abnormal  R  Mucus  PRESENT    Hyaline Casts, UA  PRESENT    Comment: [facility] at [facility] [address], [place] [address] Resulting Agency  [facility]      Specimen Collected: 06/02/18 11:16 Last Resulted: 06/02/18 11:43        Ref Range & Units 3d ago  Ferritin 11 - 307 ng/mL 17   Comment: [facility] at [facility] [address], [place] [address] [facility]ing [facility]  [facility]      Specimen Collected: 06/02/18 10:52 Last Resulted: 06/02/18 12:36        I  Ref Range & Units 3d ago  Iron 28 - 170 ug/dL 54   TIBC 250 - 450 ug/dL 437  Saturation Ratios 10.4 - 31.8 % 12   UIBC ug/dL 383   Comment: Performed at [facility], [address] [place], [place] [address]  Resulting Agency  [facility] CLIN LAB      Specimen Collected: 06/02/18 10:52 Last Resulted: 06/02/18 12:36         Component 3d ago  Tech Review RBC MORPHOLOGY UNREMARKABLE   Comment: WBC MORPHOLOGY UNREMARKABLE  PLATELETS APPEAR ADEQUATE  COUNT MAY BE INACCURATE DUE TO FIBRIN CLUMPS.  PLATELETS VARY IN SIZE, LARGE BODY PLATELETS NOTED ON SMEAR  Performed at [facility], [address] [place], [place] [address]   Resulting Agency [facility]IN LAB      Specimen Collected: 06/02/18 10:52 Last Resulted: 06/02/18 11:50          Ref Range & Units 3d ago  Retic Ct Pct 0.4 - 3.1 % 1.3   RBC. 3.80 - 5.20 MIL/uL 3.96   Retic Count, Absolute 19.0 - 183.0 K/uL 51.5   Comment: Performed at [facility], [address] [place], [place] [address]  Resulting Agency  [facility] CLIN LAB      Specimen Collected: 06/02/18 10:52 Last Resulted: 06/02/18 12:07          Ref Range & Units 3d ago  Sodium 135 - 145 mmol/L 135   Potassium 3.5 - 5.1 mmol/L 3.8   Chloride 101 - 111 mmol/L 102    CO2 22 - 32 mmol/L 23   Glucose, Bld 65 - 99 mg/dL 89   BUN 6 - 20 mg/dL 16   Creatinine, Ser 0.44 - 1.00 mg/dL 0.97   Calcium 8.9 - 10.3 mg/dL 9.5   Total Protein 6.5 - 8.1 g/dL 7.6   Albumin 3.5 - 5.0 g/dL 4.0   AST 15 - 41 U/L 37   ALT 14 - 54 U/L 15   Alkaline Phosphatase 38 - 126 U/L 92   Total Bilirubin 0.3 - 1.2 mg/dL 0.5   GFR calc non Af Amer >60 mL/min 58Low    GFR calc Af Amer >60 mL/min >60   Comment: (NOTE)  The eGFR has been calculated using the CKD EPI equation.  This calculation has not been validated in all clinical situations.  eGFR's persistently <60 mL/min signify possible Chronic Kidney  Disease.   Anion gap 5 - 15 10   Comment: Performed at [facility], [address] [place], [place] [address]  Resulting Agency  [facility] CLIN LAB      Specimen Collected: 06/02/18 10:52 Last Resulted: 06/02/18 11:31           Ref Range & Units 3d ago  Kappa free light chain 3.3 - 19.4 mg/L 37.0High    Lamda free light chains 5.7 - 26.3 mg/L 24.6   Kappa, lamda light chain ratio 0.26 - 1.65 1.50   Comment: (NOTE)  Performed At: [facility]  [place], [place] [id number]  Rush Farmer MD [id number]  Performed at [facility], [address] [place], [place]  [address]   Resulting Agency  [facility] CL[facility] LAB      Specimen Collected: 06/02/18 10:52 Last Resulted: 06/03/18 12:37          Ref Range & Units 3d ago (06/02/18) 57yrago (01/02/16) 23yrgo (01/02/16)  IgG (Immunoglobin G), Serum 700 - 1,600 mg/dL 1,085   832   IgA 87 - 352 mg/dL 597High      IgM (Immunoglobulin M), Srm 26 - 217 mg/dL 200  121   Total Protein ELP 6.0 - 8.5 g/dL 7.4 VC    Albumin SerPl Elph-Mcnc 2.9 - 4.4 g/dL 3.8 VC  3.2   Alpha 1 0.0 - 0.4 g/dL 0.3 VC  0.4   Alpha2 Glob SerPl Elph-Mcnc 0.4 - 1.0 g/dL 0.7 VC  1.0   B-Globulin SerPl Elph-Mcnc 0.7 - 1.3 g/dL 1.3 VC  1.3   Gamma Glob SerPl Elph-Mcnc 0.4 - 1.8 g/dL 1.4 VC  1.0   M Protein SerPl Elph-Mcnc Not  Observed g/dL Not Observed VC  Not Observed   Globulin, Total 2.2 - 3.9 g/dL 3.6 VC 2.9 R 3.6   Albumin/Glob SerPl 0.7 - 1.7 1.1 VC  0.9   IFE 1  Comment VC  Comment CM  Comment: (NOTE)  An apparent polyclonal gammopathy: IgA. Kappa and lambda typing  appear increased.   Please Note  Comment VC  Comment CM  Comment: (NOTE)  Protein electrophoresis scan will follow via computer, mail, or  courier delivery.  Performed At: [facility]  [place], [place] [id number]  Rush Farmer MD VO:[id number]  Performed at [facility], [address] [place], [place]  [address]   Resulting Agency  [facility] [place]      Specimen Collected: 06/02/18 10:52 Last Resulted: 06/04/18 11:39             Component 3d ago  ANA Ab, IFA Negative   Comment: (NOTE)                    Negative  <1:80                    Borderline 1:80                    Positive  >1:80  Performed At: [facility]  [place], [place] [id number]  Rush Farmer MD [id number]  Performed at [facility], [address] [place], [place]  [address]   Resulting Agency [facility]IN LAB      Specimen Collected: 06/02/18 10:52 Last Resulted: 06/03/18 13:39        Ref Range & Units 3d ago  Homocysteine 0.0 - 15.0 umol/L <3.0   Comment: (NOTE)  Performed At: [facility]  [place], [place] [id number]  Rush Farmer MD [id number]  Performed at [facility], [address], [place], [place]  [address]   Resulting Agency  [facility] CLIN LAB      Specimen Collected: 06/02/18 10:52 Last Resulted: 06/03/18 15:37          Ref Range & Units 3d ago  Parietal Cell Antibody-IgG 0.0 - 20.0 Units 46.0High    Comment: (NOTE)                 Negative  0.0 - 20.0                 Equivocal 20.1 - 24.9                 Positive      >24.9  Parietal Cell Antibodies are found in 90% of patients  with pernicious anemia and 30% of first degree  relatives with pernicious anemia.  Performed At: [facility]  [place], [place] [id number]  Rush Farmer MD VP:[id number]  Performed at [facility], [address], [place],   [address]   Resulting Agency  [facility]      Specimen Collected: 06/02/18 10:52 Last Resulted: 06/04/18 12:37

## 2018-06-08 ENCOUNTER — Other Ambulatory Visit: Payer: Self-pay

## 2018-06-08 ENCOUNTER — Encounter: Payer: Self-pay | Admitting: Oncology

## 2018-06-08 ENCOUNTER — Inpatient Hospital Stay (HOSPITAL_BASED_OUTPATIENT_CLINIC_OR_DEPARTMENT_OTHER): Payer: Medicare Other | Admitting: Oncology

## 2018-06-08 VITALS — BP 113/79 | HR 90 | Temp 99.2°F | Resp 18 | Wt 216.4 lb

## 2018-06-08 DIAGNOSIS — I4891 Unspecified atrial fibrillation: Secondary | ICD-10-CM | POA: Diagnosis not present

## 2018-06-08 DIAGNOSIS — D649 Anemia, unspecified: Secondary | ICD-10-CM

## 2018-06-08 DIAGNOSIS — Z86010 Personal history of colonic polyps: Secondary | ICD-10-CM

## 2018-06-08 DIAGNOSIS — Z7901 Long term (current) use of anticoagulants: Secondary | ICD-10-CM | POA: Diagnosis not present

## 2018-06-08 DIAGNOSIS — D509 Iron deficiency anemia, unspecified: Secondary | ICD-10-CM | POA: Diagnosis not present

## 2018-06-08 DIAGNOSIS — E538 Deficiency of other specified B group vitamins: Secondary | ICD-10-CM | POA: Diagnosis not present

## 2018-06-08 MED ORDER — FERROUS SULFATE 325 (65 FE) MG PO TBEC: 325.0000 mg | DELAYED_RELEASE_TABLET | Freq: Two times a day (BID) | ORAL | 3 refills | Status: DC

## 2018-06-08 MED ORDER — TOFACITINIB 5 MG TABLET
ORAL_TABLET | Freq: Two times a day (BID) | ORAL | 6 refills | 0 days | Status: CP
Start: 2018-06-08 — End: 2018-06-22

## 2018-06-08 NOTE — Telephone Encounter (Signed)
Results were discussed during office visit today

## 2018-06-08 NOTE — Progress Notes (Signed)
Patient here today for follow up.  Patient c/o feeling fatigue

## 2018-06-08 NOTE — Progress Notes (Signed)
Hematology/Oncology Consult note [facility] [phone] [phone]   Patient Care Team: [name], FNP as PCP - General (Family Medicine)  REFERRING PROVIDER: [name], FNP  REASON FOR VISIT Follow up for treatment of anemia and iron deficiency anemia.   HISTORY OF PRESENTING ILLNESS:  Brandy Armstrong is a  72 y.o.  female with PMH listed below who was referred to me for evaluation of anemia and elevated iron level.  Patient recently had lab work done which revealed anemia with hemoglobin count at 10.2 on 05/04/2018.  I reviewed patient's previous labs, Anemia is chronic, dated back to 2014. She recalls having 1 PRBC transfusion in the past and was told she has iron deficiency anemia. She takes oral OTC  iron supplements and anemia has improved.  Associated with fatigue, lack of energy. Denies weight loss, easy bruising, hematochezia, hemoptysis.  Last colonoscopy 2016 revealed polyps which were removed. No malignancy was found.  Last EGD: 2016 which showed gastritis.   Reviewed recent iron panel which showed elevated iron at 346, TSAT elevated at 89, ferritin 26. Denies any family history of hemachromatosis.  Recent B12 level showed decreased B12, she reports that she has been started on parental B12 injections with her PCP.  History RF, she takes plaquenil 200mg  daily.  History of Afib, takes Elqiuis 5mg  BID.   INTERVAL HISTORY Brandy Armstrong is a 72 y.o. female who has above history reviewed by me today presents for follow up visit for management of iron deficiency anemia Problems and complaints are listed below: #Anemia: Hemoglobin stable. #Fatigue: Improved. Patient has had lab work done at last visit for anemia work-up and present to discuss about results.   Review of Systems  Constitutional: Negative for chills, fever, malaise/fatigue and weight loss.  HENT: Negative for congestion, ear discharge, ear pain,  nosebleeds, sinus pain and sore throat.   Eyes: Negative for double vision, photophobia, pain, discharge and redness.  Respiratory: Negative for cough, hemoptysis, sputum production, shortness of breath and wheezing.   Cardiovascular: Negative for chest pain, palpitations, orthopnea, claudication and leg swelling.  Gastrointestinal: Negative for abdominal pain, blood in stool, constipation, diarrhea, heartburn, melena, nausea and vomiting.  Genitourinary: Negative for dysuria, flank pain, frequency and hematuria.  Musculoskeletal: Negative for back pain, myalgias and neck pain.  Skin: Negative for itching and rash.  Neurological: Negative for dizziness, tingling, tremors, focal weakness, weakness and headaches.  Endo/Heme/Allergies: Negative for environmental allergies. Does not bruise/bleed easily.  Psychiatric/Behavioral: Negative for depression and hallucinations. The patient is not nervous/anxious.     MEDICAL HISTORY:  Past Medical History:  Diagnosis Date  . Atrial fibrillation [facility])    a. s/p ablation 01/2012 [facility] by Dr. Boyd Kerbs;  b. On sotalol & Xarelto;  c. 02/2014 Echo: EF 50-55%, mild conc LVH, nl LA size/structure;  d. Recurrent afib 8/15 & 08/29/2014.  . Barretts esophagus   . Chest pain    a. 02/2014 Myoview: Ef 50%, no ischemia.  . Colon polyps   . COPD (chronic obstructive pulmonary disease) ([place])   . COPD (chronic obstructive pulmonary disease) ([place])   . Depression with anxiety   . GERD (gastroesophageal reflux disease)   . Neuropathy   . Rectal fistula   . Rheumatoid arthritis ([place])    On methotrexate and orencia  . Urinary incontinence   . Vitamin D deficiency     SURGICAL HISTORY: Past Surgical History:  Procedure Laterality Date  . ABDOMINAL HYSTERECTOMY  [place] STUDY AND ABLATION  2013  . CHOLECYSTECTOMY  1987  . COLONOSCOPY N/A 05/08/2015   Procedure: COLONOSCOPY;  Surgeon: Manya Silvas, MD;  [facility] END[facility]COPY;   Service: Endoscopy;  Laterality: N/A;  . ESOPHAGOGASTRODUODENOSCOPY N/A 05/06/2015   Procedure: ESOPHAGOGASTRODUODENOSCOPY (EGD);  Surgeon: Lollie Sails, MD;  Location: [facility] ENDOSCOPY;  Service: Endoscopy;  Laterality: N/A;  . OOPHORECTOMY    . oophrectomy Bilateral 1992    SOCIAL HISTORY: Social History   Socioeconomic History  . Marital status: Single    Spouse name: Not on file  . Number of children: 0  . Years of education: 62  . Highest education level: Not on file  Occupational History  . Occupation: Retired  Scientific laboratory technician  . Financial resource strain: Not on file  . Food insecurity:    Worry: Not on file    Inability: Not on file  . Transportation needs:    Medical: Not on file    Non-medical: Not on file  Tobacco Use  . Smoking status: Former Smoker    Packs/day: 1.00    Years: 40.00    Pack years: 40.00    Last attempt to quit: 12/16/2011    Years since quitting: 6.4  . Smokeless tobacco: Never Used  Substance and Sexual Activity  . Alcohol use: Not Currently    Alcohol/week: 0.0 oz    Comment: Occasionally has a drink  . Drug use: No  . Sexual activity: Never  Lifestyle  . Physical activity:    Days per week: Not on file    Minutes per session: Not on file  . Stress: Not on file  Relationships  . Social connections:    Talks on phone: Not on file    Gets together: Not on file    Attends religious service: Not on file    Active member of club or organization: Not on file    Attends meetings of clubs or organizations: Not on file    Relationship status: Not on file  . Intimate partner violence:    Fear of current or ex partner: Not on file    Emotionally abused: Not on file    Physically abused: Not on file    Forced sexual activity: Not on file  Other Topics Concern  . Not on file  Social History Narrative   Ms. Revels was born and reared in [place]. She attended [employer] and graduated in 1971 with her [employer] in Education. 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 She then worked for a [employer] in [place]. She is single. She is currently retired. She loves reading. She loves to spend time with her dog.       Caffeine use: 2 cups coffee per day   2 glasses iced tea/ day   Right-handed    FAMILY HISTORY: Family History  Problem Relation Age of Onset  . Depression Mother   . Pancreatic cancer Mother   . Colon cancer Father   . Breast cancer Sister 24  . Diabetes Brother   . Breast cancer Cousin   . Neuropathy Neg Hx     ALLERGIES:  is allergic to latex.  MEDICATIONS:  Current Outpatient Medications  Medication Sig Dispense Refill  . albuterol (PROVENTIL HFA;VENTOLIN HFA) 108 (90 Base) MCG/ACT inhaler Inhale 1-2 puffs into the lungs every 4 (four) hours as needed for wheezing or shortness of breath. 1  Inhaler 10  . budesonide-formoterol (SYMBICORT) 160-4.5 MCG/ACT inhaler INHALE 2 PUFFS TWO (2) TIMES A DAY. 3 Inhaler 1  . cyanocobalamin (,VITAMIN B-12,) 1000 MCG/ML injection 1000 mcg (1 mg) injection once per week for four weeks, followed by 1000 mcg injection once per month. 1 mL 15  . cyclobenzaprine (FLEXERIL) 5 MG tablet Take 1 tablet (5 mg total) by mouth at bedtime. 30 tablet 1  . ELIQUIS 5 MG TABS tablet Take 1 tablet (5 mg total) by mouth 2 (two) times daily. 180 tablet 3  . esomeprazole (NEXIUM) 40 MG capsule TAKE ONE CAPSULE BY MOUTH DAILY AT 12 NOON 30 capsule 0  . furosemide (LASIX) 20 MG tablet Take 1 tablet (20 mg total) by mouth daily as needed. 30 tablet 3  . gabapentin (NEURONTIN) 300 MG capsule Take 1 capsule (300 mg total) by mouth 3 (three) times daily. 60 capsule 3  . hydroxychloroquine (PLAQUENIL) 200 MG tablet Take 200 mg by mouth.    . lidocaine (XYLOCAINE) 5 % ointment Apply 1 application topically as needed.    . propranolol ER (INDERAL LA) 80 MG 24 hr capsule Take 1 capsule (80 mg total) by mouth daily. 90 capsule 3  .  sertraline (ZOLOFT) 50 MG tablet Take 1 tablet (50 mg total) by mouth at bedtime. 90 tablet 3  . SPIRIVA RESPIMAT 2.5 MCG/ACT AERS 2 PUFFS BY MOUTH DAILY. 3 Inhaler 1  . Syringe/Needle, Disp, (SYRINGE 3CC/25GX5/8") 25G X 5/8" 3 ML MISC Use as directed 5 each 0  . Tofacitinib Citrate 5 MG TABS Take 5 mg by mouth.    . ferrous sulfate 325 (65 FE) MG EC tablet Take 1 tablet (325 mg total) by mouth 2 (two) times daily with a meal. 60 tablet 3   No current facility-administered medications for this visit.      PHYSICAL EXAMINATION: ECOG PERFORMANCE STATUS: 1 - Symptomatic but completely ambulatory Vitals:   06/08/18 1131  BP: 113/79  Pulse: 90  Resp: 18  Temp: 99.2 F (37.3 C)   Filed Weights   06/08/18 1131  Weight: 216 lb 6 oz (98.1 kg)    Physical Exam  Constitutional: She is oriented to person, place, and time. She appears well-developed and well-nourished. No distress.  HENT:  Head: Normocephalic and atraumatic.  Right Ear: External ear normal.  Left Ear: External ear normal.  Mouth/Throat: Oropharynx is clear and moist.  Eyes: Pupils are equal, round, and reactive to light. Conjunctivae and EOM are normal. No scleral icterus.  Neck: Normal range of motion. Neck supple.  Cardiovascular: Normal rate, regular rhythm and normal heart sounds.  Pulmonary/Chest: Effort normal and breath sounds normal. No respiratory distress. She has no wheezes. She has no rales. She exhibits no tenderness.  Abdominal: Soft. Bowel sounds are normal. She exhibits no distension and no mass. There is no tenderness.  Musculoskeletal: Normal range of motion. She exhibits no edema or deformity.  Lymphadenopathy:    She has no cervical adenopathy.  Neurological: She is alert and oriented to person, place, and time. No cranial nerve deficit. Coordination normal.  Skin: Skin is warm and dry. No rash noted.  Psychiatric: She has a normal mood and affect. Her behavior is normal. Thought content normal.      LABORATORY DATA:  I have reviewed the data as listed Lab Results  Component Value Date   WBC 4.6 06/02/2018   HGB 11.0 (L) 06/02/2018   HCT 33.8 (L) 06/02/2018   MCV 84.8 06/02/2018   PLT  248 06/02/2018   Recent Labs    08/06/17 1330 06/02/18 1052  NA 136 135  K 4.5 3.8  CL 100 102  CO2 31 23  GLUCOSE 114* 89  BUN 12 16  CREATININE 1.06 0.97  CALCIUM 9.3 9.5  GFRNONAA  --  58*  GFRAA  --  >60  PROT 7.0 7.6  ALBUMIN 3.7 4.0  AST 22 37  ALT 10 15  ALKPHOS 98 92  BILITOT 0.5 0.5       ASSESSMENT & PLAN:  1. Anemia, unspecified type    # Anemia: Labs reviewed.  Hemoglobin improved to 11. Iron panel which was done in May 2019 which does not make much sense with high iron saturation but low ferritin.   So I repeated another iron level.  Patient has an iron level 54, TSAT at 12.  Ferritin at 17, this is consistent with iron deficiency.  I advised patient to restart ferrous sulfate 325 mg twice daily with meals..  Prescription sent to pharmacy.  Advised patient also takes 2 soft and as needed if iron supplementation causes constipation.  # B12 deficiency: labs reviewed. 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 Recommend patient to follow up with GI for additional work up. She has started on B12 injection with primary care provider's office. .  All questions were answered. The patient knows to call the clinic with any problems questions or concerns. Orders Placed This Encounter  Procedures  . CBC with Differential    Standing Status:   Future    Standing Expiration Date:   06/09/2019  . Ferritin    Standing Status:   Future    Standing Expiration Date:   06/09/2019  . Iron and TIBC    Standing Status:   Future    Standing Expiration Date:   06/09/2019    Return of visit:  3 months and repeat labs.   Earlie Server, MD, PhD Hematology Oncology [facility] Pager- [phone] 06/08/2018

## 2018-06-09 ENCOUNTER — Encounter: Payer: Self-pay | Admitting: Oncology

## 2018-06-10 ENCOUNTER — Encounter: Payer: Self-pay | Admitting: Family

## 2018-06-11 ENCOUNTER — Ambulatory Visit
Admission: RE | Admit: 2018-06-11 | Discharge: 2018-06-11 | Disposition: A | Discharge status: Home / Self Care | Payer: Medicare Other | Source: Ambulatory Visit | Attending: Family | Admitting: Family

## 2018-06-11 DIAGNOSIS — R928 Other abnormal and inconclusive findings on diagnostic imaging of breast: Secondary | ICD-10-CM | POA: Diagnosis not present

## 2018-06-11 DIAGNOSIS — N6489 Other specified disorders of breast: Secondary | ICD-10-CM

## 2018-06-13 NOTE — Progress Notes (Signed)
Subjective:    Patient ID: Brandy Armstrong, female    DOB: Jul 11, 1946, 72 y.o.   MRN: [id number]  CC: Brandy Armstrong is a 72 y.o. female who presents today for follow up.   HPI: Itchy Rash on forehead x 2 days, unchanged.  Endorses watering eyes. Eyebrows itching. No new lotions. Hasnt been outside for extended period of time. No sinus pain, sore throat, congestion.  Still complains of fatigue, unchanged.  No real improvement on b12.  Hasnt started iron yet.   Anemia- Has seen Dr Tasia Catchings, She advised to follow up with Dr Vira Agar.  Patient awaiting on financial assistance program to see GI. Had phone call today   b12 deficiency- concern with nexium.   H/o Barrett's esophagus- of note, not seen on last on EGD.   Afib- propranolol-taking twice per day. No palpitations. Tremors well controlled.   Depression- on 25mg  zoloft. Had been on 50mg . Cannot tell if it is helping. No hi/si. Would like to come off zoloft as on 'too many medications.'  COPD - no cough, increased sputum. SOB has improved with intentional weight loss through diet.       Anti parietal antibody elevated.  HISTORY:  Past Medical History:  Diagnosis Date  . Atrial fibrillation [facility])    a. s/p ablation 01/2012 in [facility] by Dr. Boyd Kerbs;  b. On sotalol & Xarelto;  c. 02/2014 Echo: EF 50-55%, mild conc LVH, nl LA size/structure;  d. Recurrent afib 8/15 & 08/29/2014.  . Barretts esophagus   . Chest pain    a. 02/2014 Myoview: Ef 50%, no ischemia.  . Colon polyps   . COPD (chronic obstructive pulmonary disease) ([place])   . COPD (chronic obstructive pulmonary disease) ([place])   . Depression with anxiety   . GERD (gastroesophageal reflux disease)   . Neuropathy   . Rectal fistula   . Rheumatoid arthritis ([place])    On methotrexate and orencia  . Urinary incontinence   . Vitamin D deficiency    Past Surgical History:  Procedure Laterality Date  . ABDOMINAL HYSTERECTOMY  1992  . CARDIAC ELECTROPHYSIOLOGY STUDY AND ABLATION   2013  . CHOLECYSTECTOMY  1987  . COLONOSCOPY N/A 05/08/2015   Procedure: COLONOSCOPY;  Surgeon: Manya Silvas, MD;  Location: [facility] ENDOSCOPY;  Service: Endoscopy;  Laterality: N/A;  . ESOPHAGOGASTRODUODENOSCOPY N/A 05/06/2015   Procedure: ESOPHAGOGASTRODUODENOSCOPY (EGD);  Surgeon: Lollie Sails, MD;  Location: [facility] ENDOSCOPY;  Service: Endoscopy;  Laterality: N/A;  . OOPHORECTOMY    . oophrectomy Bilateral 1992   Family History  Problem Relation Age of Onset  . Depression Mother   . Pancreatic cancer Mother   . Colon cancer Father   . Breast cancer Sister 85  . Diabetes Brother   . Breast cancer Cousin   . Neuropathy Neg Hx     Allergies: Latex Current Outpatient Medications on File Prior to Visit  Medication Sig Dispense Refill  . albuterol (PROVENTIL HFA;VENTOLIN HFA) 108 (90 Base) MCG/ACT inhaler Inhale 1-2 puffs into the lungs every 4 (four) hours as needed for wheezing or shortness of breath. 1 Inhaler 10  . budesonide-formoterol (SYMBICORT) 160-4.5 MCG/ACT inhaler INHALE 2 PUFFS TWO (2) TIMES A DAY. 3 Inhaler 1  . cyanocobalamin (,VITAMIN B-12,) 1000 MCG/ML injection 1000 mcg (1 mg) injection once per week for four weeks, followed by 1000 mcg injection once per month. 1 mL 15  . cyclobenzaprine (FLEXERIL) 5 MG tablet Take 1 tablet (5 mg total) by mouth at bedtime.  30 tablet 1  . ELIQUIS 5 MG TABS tablet Take 1 tablet (5 mg total) by mouth 2 (two) times daily. 180 tablet 3  . esomeprazole (NEXIUM) 40 MG capsule TAKE ONE CAPSULE BY MOUTH DAILY AT 12 NOON 30 capsule 0  . ferrous sulfate 325 (65 FE) MG EC tablet Take 1 tablet (325 mg total) by mouth 2 (two) times daily with a meal. 60 tablet 3  . furosemide (LASIX) 20 MG tablet Take 1 tablet (20 mg total) by mouth daily as needed. 30 tablet 3  . gabapentin (NEURONTIN) 300 MG capsule Take 1 capsule (300 mg total) by mouth 3 (three) times daily. 60 capsule 3  . hydroxychloroquine (PLAQUENIL) 200 MG tablet Take 200 mg by mouth.      . lidocaine (XYLOCAINE) 5 % ointment Apply 1 application topically as needed.    . propranolol ER (INDERAL LA) 80 MG 24 hr capsule Take 1 capsule (80 mg total) by mouth daily. 90 capsule 3  . SPIRIVA RESPIMAT 2.5 MCG/ACT AERS 2 PUFFS BY MOUTH DAILY. 3 Inhaler 1  . Syringe/Needle, Disp, (SYRINGE 3CC/25GX5/8") 25G X 5/8" 3 ML MISC Use as directed 5 each 0  . Tofacitinib Citrate 5 MG TABS Take 5 mg by mouth.     No current facility-administered medications on file prior to visit.     Social History   Tobacco Use  . Smoking status: Former Smoker    Packs/day: 1.00    Years: 40.00    Pack years: 40.00    Last attempt to quit: 12/16/2011    Years since quitting: 6.5  . Smokeless tobacco: Never Used  Substance Use Topics  . Alcohol use: Not Currently    Alcohol/week: 0.0 oz    Comment: Occasionally has a drink  . Drug use: No    Review of Systems  Constitutional: Positive for fatigue. Negative for chills, fever and unexpected weight change.  Respiratory: Positive for shortness of breath (improved). Negative for cough.   Cardiovascular: Negative for chest pain and palpitations.  Gastrointestinal: Negative for nausea and vomiting.  Skin: Positive for rash.  Neurological: Positive for tremors (improved).  Psychiatric/Behavioral: Negative for suicidal ideas.      Objective:    BP 114/80 (BP Location: Left Arm, Patient Position: Sitting, Cuff Size: Normal)   Pulse (!) 58   Temp 98.1 F (36.7 C) (Oral)   Resp 16   Wt 220 lb 2 oz (99.8 kg)   SpO2 97%   BMI 35.53 kg/m  BP Readings from Last 3 Encounters:  06/15/18 114/80  06/08/18 113/79  06/02/18 124/79   Wt Readings from Last 3 Encounters:  06/15/18 220 lb 2 oz (99.8 kg)  06/08/18 216 lb 6 oz (98.1 kg)  06/02/18 219 lb 5 oz (99.5 kg)    Physical Exam  Constitutional: She appears well-developed and well-nourished.  HENT:  Head: Normocephalic and atraumatic.    Right Ear: Hearing, tympanic membrane, external ear and  ear canal normal. No drainage, swelling or tenderness. No foreign bodies. Tympanic membrane is not erythematous and not bulging. No middle ear effusion. No decreased hearing is noted.  Left Ear: Hearing, tympanic membrane, external ear and ear canal normal. No drainage, swelling or tenderness. No foreign bodies. Tympanic membrane is not erythematous and not bulging.  No middle ear effusion. No decreased hearing is noted.  Nose: Nose normal. No rhinorrhea. Right sinus exhibits no maxillary sinus tenderness and no frontal sinus tenderness. Left sinus exhibits no maxillary sinus tenderness and no  frontal sinus tenderness.  Mouth/Throat: Uvula is midline, oropharynx is clear and moist and mucous membranes are normal. No oropharyngeal exudate, posterior oropharyngeal edema, posterior oropharyngeal erythema or tonsillar abscesses.  Fine maculopapular lesions noted across forehead. No purulent discharge, increased warmth, erythema appreciated. No vesicles.   Eyes: Conjunctivae are normal.  Cardiovascular: Normal rate, regular rhythm, normal heart sounds and normal pulses.  Pulmonary/Chest: Effort normal and breath sounds normal. She has no wheezes. She has no rhonchi. She has no rales.  Lymphadenopathy:       Head (right side): No submental, no submandibular, no tonsillar, no preauricular, no posterior auricular and no occipital adenopathy present.       Head (left side): No submental, no submandibular, no tonsillar, no preauricular, no posterior auricular and no occipital adenopathy present.    She has no cervical adenopathy.  Neurological: She is alert.  Skin: Skin is warm and dry.  Psychiatric: She has a normal mood and affect. Her speech is normal and behavior is normal. Thought content normal.  Vitals reviewed.      Assessment & Plan:   Problem List Items Addressed This Visit      Respiratory   COPD (chronic obstructive pulmonary disease) ([place])    No signs or symptoms of acute exacerbation.   Reassured the shortness of breath has improved with weight loss.  Congratulated patient on this.will follow.       Relevant Medications   loratadine (CLARITIN) 10 MG tablet     Digestive   Barrett's esophagus    History of.  Not mentioned on last EGD.  Advised her to follow-up with GI in regards to stopping the Nexium.  In usual circumstances as I explained to patient, I would advise taking histamine 2 blocker however with a history of Barrett's esophagus, guidelines do support staying on a PPI.  However do suspect this may be playing a role in her B12 deficiency.  Again, advised follow-up with GI which patient is currently working on from financial point of view.  Will follow        Musculoskeletal and Integument   Rash - Primary    Symptoms most consistent with contact dermatitis ( ? Allergic).   Will treat with over-the-counter antihistamine, short course of low-dose topical corticosteroid.  Patient let me know if no improvement peer      Relevant Medications   loratadine (CLARITIN) 10 MG tablet   triamcinolone (KENALOG) 0.025 % cream     Other   Anemia    Follow with hematology.  Patient has not started iron yet.  She states she will start this today.  Will follow      Depression, recurrent (HCC)    Unchanged.  Patient would like to trial start Zoloft based on preference.  Patient let me know how she is doing peer      B12 deficiency    On B12 injections.  Antiparietal antibody elevated.  Again, emphasized the patient the importance of following up with GI for evaluation of pernicious anemia.  We will repeat B12 at follow-up visit.          I have discontinued Edwin Cherian. Mcquinn "Tisha Duddy"'s sertraline. I am also having her start on loratadine and triamcinolone. Additionally, I am having her maintain her hydroxychloroquine, lidocaine, Tofacitinib Citrate, albuterol, esomeprazole, furosemide, ELIQUIS, propranolol ER, cyclobenzaprine, gabapentin, cyanocobalamin, SYRINGE  3CC/25GX5/8", budesonide-formoterol, SPIRIVA RESPIMAT, and ferrous sulfate.   Meds ordered this encounter  Medications  . loratadine (CLARITIN) 10 MG tablet  Sig: Take 1 tablet (10 mg total) by mouth daily.    Dispense:  30 tablet    Refill:  11    Order Specific Question:   Supervising Provider    Answer:   Deborra Medina L [[id number]]  . triamcinolone (KENALOG) 0.025 % cream    Sig: Apply 1 application topically 2 (two) times daily.    Dispense:  15 g    Refill:  0    Order Specific Question:   Supervising Provider    Answer:   Crecencio Mc [[id number]]    Return precautions given.   Risks, benefits, and alternatives of the medications and treatment plan prescribed today were discussed, and patient expressed understanding.   Education regarding symptom management and diagnosis given to patient on AVS.  Continue to follow with [name], FNP for routine health maintenance.   [name] and I agreed with plan.   Mable Paris, FNP

## 2018-06-15 ENCOUNTER — Ambulatory Visit (INDEPENDENT_AMBULATORY_CARE_PROVIDER_SITE_OTHER): Payer: Medicare Other | Admitting: Family

## 2018-06-15 ENCOUNTER — Encounter: Payer: Self-pay | Admitting: Family

## 2018-06-15 VITALS — BP 114/80 | HR 58 | Temp 98.1°F | Resp 16 | Wt 220.1 lb

## 2018-06-15 DIAGNOSIS — J438 Other emphysema: Secondary | ICD-10-CM | POA: Diagnosis not present

## 2018-06-15 DIAGNOSIS — J309 Allergic rhinitis, unspecified: Secondary | ICD-10-CM

## 2018-06-15 DIAGNOSIS — F339 Major depressive disorder, recurrent, unspecified: Secondary | ICD-10-CM | POA: Diagnosis not present

## 2018-06-15 DIAGNOSIS — E538 Deficiency of other specified B group vitamins: Secondary | ICD-10-CM | POA: Diagnosis not present

## 2018-06-15 DIAGNOSIS — K22719 Barrett's esophagus with dysplasia, unspecified: Secondary | ICD-10-CM | POA: Diagnosis not present

## 2018-06-15 DIAGNOSIS — R21 Rash and other nonspecific skin eruption: Secondary | ICD-10-CM

## 2018-06-15 DIAGNOSIS — D649 Anemia, unspecified: Secondary | ICD-10-CM | POA: Diagnosis not present

## 2018-06-15 MED ORDER — TRIAMCINOLONE ACETONIDE 0.025 % EX CREA: 1.0000 "application " | TOPICAL_CREAM | Freq: Two times a day (BID) | CUTANEOUS | 0 refills | Status: DC

## 2018-06-15 MED ORDER — LORATADINE 10 MG PO TABS
10.0000 mg | ORAL_TABLET | Freq: Every day | ORAL | 11 refills | Status: DC
Start: 2018-06-15 — End: 2018-12-17

## 2018-06-15 NOTE — Patient Instructions (Addendum)
Please start taking iron.   May trial stop the zoloft if you would like. Let me know how you are doing.  GI appointment- let me know if any issues with scheduling.  As discussed, with your history of Barrett's esophagus or suspected Barrett's esophagus, you would need to stay on Nexium.  However you may have a discussion with Dr. Vira Agar and he may decide for you to come off.   Please bring back your hemocult cards.   Please call with dose of propranol when you get home. Please also send a message to Dr Rockey Situ to confirm what dose you are supposed to be on. I have propranolol ER 80mg  ONCE per day on the chart. Suspect this is contributory to fatigue..   Trial of topical steroid for itchy rash.  Please also start Claritin.  Let me know if it is not improved.

## 2018-06-16 ENCOUNTER — Telehealth: Payer: Self-pay | Admitting: Cardiovascular Disease

## 2018-06-16 NOTE — Telephone Encounter (Signed)
No answer. Left message to call back.   

## 2018-06-16 NOTE — Telephone Encounter (Signed)
Pt is returning your call

## 2018-06-16 NOTE — Telephone Encounter (Signed)
Patient calling to confirm Propranolol Dosage .  Bottle says 80 mg po q d she has been taking 80 mg po BID and pcp thinks this is what is making her tired.  Please call.

## 2018-06-17 DIAGNOSIS — E538 Deficiency of other specified B group vitamins: Secondary | ICD-10-CM | POA: Insufficient documentation

## 2018-06-17 DIAGNOSIS — L509 Urticaria, unspecified: Secondary | ICD-10-CM | POA: Insufficient documentation

## 2018-06-17 NOTE — Assessment & Plan Note (Signed)
History of.  Not mentioned on last EGD.  Advised her to follow-up with GI in regards to stopping the Nexium.  In usual circumstances as I explained to patient, I would advise taking histamine 2 blocker however with a history of Barrett's esophagus, guidelines do support staying on a PPI.  However do suspect this may be playing a role in her B12 deficiency.  Again, advised follow-up with GI which patient is currently working on from financial point of view.  Will follow

## 2018-06-17 NOTE — Assessment & Plan Note (Signed)
No signs or symptoms of acute exacerbation.  Reassured the shortness of breath has improved with weight loss.  Congratulated patient on this.will follow.

## 2018-06-17 NOTE — Assessment & Plan Note (Addendum)
On B12 injections.  Antiparietal antibody elevated.  Again, emphasized the patient the importance of following up with GI for evaluation of pernicious anemia.  We will repeat B12 at follow-up visit.

## 2018-06-17 NOTE — Assessment & Plan Note (Addendum)
Symptoms most consistent with contact dermatitis ( ? Allergic).   Will treat with over-the-counter antihistamine, short course of low-dose topical corticosteroid.  Patient let me know if no improvement peer

## 2018-06-17 NOTE — Assessment & Plan Note (Signed)
Follow with hematology.  Patient has not started iron yet.  She states she will start this today.  Will follow

## 2018-06-17 NOTE — Assessment & Plan Note (Signed)
Unchanged.  Patient would like to trial start Zoloft based on preference.  Patient let me know how she is doing peer

## 2018-06-18 NOTE — Telephone Encounter (Signed)
Left voicemail message to call back  

## 2018-06-19 NOTE — Telephone Encounter (Signed)
I called and spoke with the patient. She states she picked up her propranolol medication and read where it was dosed for once a day.  She reports she was taking this twice daily. I advised her when Dr. Rockey Situ saw her for the 1st time on 05/11/18, she reported taking propanolol ER 80 mg once daily and that he made no changes to her medications at that time. He did mention in his note if she needed more propranolol for her tremors, that he could prescribe propanolol ER 60 mg BID or 120 mg once daily.  She reports she has been having a lot of fatigue on the 80 mg BID dose of propranolol ER. I have advised her to cut back to propranolol ER 80 mg once daily and monitor her symptoms. She is aware if tremors worsen, and fatigue improves, to call us back and let us know and we can up her dose on propranolol as previously discussed with Dr. Rockey Situ on 05/11/18.  The patient is agreeable and voices understanding.

## 2018-06-22 ENCOUNTER — Encounter: Admit: 2018-06-22 | Discharge: 2018-06-22 | Payer: MEDICARE

## 2018-06-22 ENCOUNTER — Ambulatory Visit: Admit: 2018-06-22 | Discharge: 2018-06-22 | Payer: MEDICARE | Attending: Rheumatology | Primary: Rheumatology

## 2018-06-22 DIAGNOSIS — M0579 Rheumatoid arthritis with rheumatoid factor of multiple sites without organ or systems involvement: Principal | ICD-10-CM

## 2018-06-22 DIAGNOSIS — G8929 Other chronic pain: Secondary | ICD-10-CM

## 2018-06-22 DIAGNOSIS — M25561 Pain in right knee: Secondary | ICD-10-CM

## 2018-06-22 DIAGNOSIS — M06069 Rheumatoid arthritis without rheumatoid factor, unspecified knee: Secondary | ICD-10-CM | POA: Diagnosis not present

## 2018-06-22 DIAGNOSIS — M25469 Effusion, unspecified knee: Secondary | ICD-10-CM | POA: Diagnosis not present

## 2018-06-22 DIAGNOSIS — M1711 Unilateral primary osteoarthritis, right knee: Secondary | ICD-10-CM | POA: Diagnosis not present

## 2018-06-22 DIAGNOSIS — M25462 Effusion, left knee: Secondary | ICD-10-CM | POA: Diagnosis not present

## 2018-06-22 DIAGNOSIS — M1810 Unilateral primary osteoarthritis of first carpometacarpal joint, unspecified hand: Secondary | ICD-10-CM | POA: Diagnosis not present

## 2018-06-22 DIAGNOSIS — M069 Rheumatoid arthritis, unspecified: Secondary | ICD-10-CM | POA: Diagnosis not present

## 2018-06-22 DIAGNOSIS — M25562 Pain in left knee: Secondary | ICD-10-CM | POA: Diagnosis not present

## 2018-06-22 MED ORDER — ADALIMUMAB PEN CITRATE FREE 40 MG/0.4 ML: pen | 1 refills | 0 days

## 2018-06-22 MED ORDER — ADALIMUMAB PEN CITRATE FREE 40 MG/0.4 ML
PACK | SUBCUTANEOUS | 1 refills | 0.00000 days | Status: CP
Start: 2018-06-22 — End: 2018-07-06

## 2018-06-22 MED ORDER — PREDNISONE 10 MG TABLET
ORAL_TABLET | 1 refills | 0 days | Status: CP
Start: 2018-06-22 — End: 2018-08-13

## 2018-06-22 MED ORDER — ADALIMUMAB PEN CITRATE FREE 40 MG/0.4 ML: 40 mg | kit | 1 refills | 0 days | Status: AC

## 2018-06-25 NOTE — Unmapped (Signed)
Assessment/Plan:   Abigail Preston is a 72 y.o.  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  At this time patient does not note significant improvement in her joints and has had continued pain and tenderness and increased morning stiffness.  [name] has not been working for her as adequately and I believe that we need to be switching therapies.  Discussed reinitiating Humira and will do it with citrate free pen which will help with the burning sensation she could have on injection.  I also had her clinical pharmacist discussed medication management and injection.    1.  Seropositive RA  - Labs today including CBC, CMP, CRP, ESR  -Discontinue Xeljanz  -Started Humira 14 mg subcu every 2 weeks  - will check xrays of hands   - will check xrays for knees   - will make an appt with Sheliah Mends for medication management and followup    2.  Right knee pain:  -I performed a right knee injection.   PROCEDURE NOTE: After a time out and discussion of risks and benefits, including the option for continued conservative management, the patient gave verbal consent for a  right knee injection.  The  right knee was identified and marked.  This area was prepped with betadine and ethyl chloride was used for topical anesthesia.  2 mL of 1% lidocaine along with 80 mg of depomedrol was injected into this site.  The patient tolerated the procedure well and there were no immediate complications.    Patient was reviewed with attending physician, Dr. Olean Ree   RTC in 4 months or sooner     Aline Brochure, MD  Rheumatology Fellow  Pager [phone]    Primary Care Provider: Orpah Clinton, NP, NP    HPI:  Abigail Preston is a 72 y.o.  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  Patient was last seen in March 2018 at that time she started noticing worsening joint pains and swelling.    She was last seen in August 2018.  At that time she was on Xeljanz and Plaquenil.  Still continues to note pain in her hands as well as her knees and shoulders.  She still notes morning stiffness.  She feels that the [name] is not working that well for her.  In the past she has tried several medications and she did note benefit from Humira however noted burning in her skin on administration of the drug.  She stated that she is willing to try that Humira.  Furthermore today patient notes increased pain in her bilateral knees right greater than left.      Review of Systems:  + as above, otherwise all other systems reviewed are negative.     Medical History:  Problem List Items Addressed This Visit        Musculoskeletal and Integument    Rheumatoid arthritis (CMS-HCC) - Primary (Chronic)    Relevant Medications    predniSONE (DELTASONE) 10 MG tablet    ADALIMUMAB PEN CITRATE FREE 40 MG/0.4 ML    Other Relevant Orders    XR Knee 1 or 2 Views Bilateral (Completed)    XR Hand 2 Views Bilateral (Completed)          Past Medical History:   Diagnosis Date   ??? Arthritis    ??? COPD (chronic obstructive  pulmonary disease) (CMS-HCC)    ??? GERD (gastroesophageal reflux disease)    ??? Obesity    ??? Osteoarthritis 09/11/2013    Involves left ankle and left shoulder    ??? Persistent atrial fibrillation (CMS-HCC)     s/p ablation   ??? Rheumatoid arthritis(714.0)    ??? Vertigo 09/16/2013       Allergies:  Latex, natural rubber; Morphine; and Latex    Medications:     Current Outpatient Medications:   ???  albuterol (PROVENTIL HFA;VENTOLIN HFA) 90 mcg/actuation inhaler, Inhale 2 puffs every six (6) hours as needed for wheezing., Disp: 1 Inhaler, Rfl: 11  ???  apixaban (ELIQUIS) 5 mg Tab, Take 1 tablet (5 mg total) by mouth Two (2) times a day. PATIENT MUST SCHEDULE A F/U APPT FOR ANY FURTHUR REFILLS, Disp: 60 tablet, Rfl: 0  ???  budesonide-formoterol (SYMBICORT) 160-4.5 mcg/actuation inhaler, Inhale 2 puffs Two (2) times a day., Disp: 3 Inhaler, Rfl: 3  ???  cholecalciferol, vitamin D3, (VITAMIN D3) 2,000 unit cap, Take by mouth., Disp: , Rfl:   ???  cyanocobalamin 1,000 mcg/mL injection, 1000 mcg (1 mg) injection once per week for four weeks, followed by 1000 mcg injection once per month., Disp: , Rfl:   ???  cyclobenzaprine (FLEXERIL) 10 MG tablet, Take 10 mg by mouth nightly as needed. , Disp: , Rfl:   ???  diclofenac sodium (VOLTAREN) 1 % gel, Apply 2 g topically Four (4) times a day., Disp: 100 g, Rfl: 0  ???  esomeprazole (NEXIUM) 40 MG capsule, Take 40 mg by mouth every morning before breakfast. Take at noon, Disp: , Rfl:   ???  ferrous sulfate 325 (65 FE) MG tablet, Take 325 mg by mouth daily with breakfast. , Disp: , Rfl:   ???  gabapentin (NEURONTIN) 600 MG tablet, Take 600 mg by mouth Three (3) times a day., Disp: , Rfl:   ???  hydroxychloroquine (PLAQUENIL) 200 mg tablet, Take 1 tablet (200 mg total) by mouth Two (2) times a day., Disp: 60 tablet, Rfl: 5  ???  lidocaine (XYLOCAINE) 5 % ointment, Apply 1 application topically daily as needed., Disp: , Rfl:   ???  mupirocin (BACTROBAN) 2 % ointment, into each nostril., Disp: , Rfl:   ???  nystatin (MYCOSTATIN) 100,000 unit/mL suspension, TAKE 5ML'S (1 TEASPOONFUL) BY MOUTH 4 TIMES A DAY, Disp: , Rfl: 0  ???  predniSONE (DELTASONE) 10 MG tablet, Take 20 mg (2 tabs) daily x 3 days, then 15 mg (1.5 tab) daily x 3 days, then 10 mg (1 tab) daily x 3 days, then stop., Disp: 14 tablet, Rfl: 1  ???  propranolol (INDERAL LA) 80 mg 24 hr capsule, Take 1 capsule (80 mg total) by mouth Two (2) times a day. (Patient taking differently: Take 80 mg by mouth daily. ), Disp: 180 capsule, Rfl: 1  ???  tiotropium bromide 2.5 mcg/actuation Mist, Inhale 2 puffs daily., Disp: 12 g, Rfl: 3  ???  ADALIMUMAB PEN CITRATE FREE 40 MG/0.4 ML, Inject 0.4 mL (40 mg total) under the skin every fourteen (14) days., Disp: 2 kit, Rfl: 1  ???  ciclopirox (PENLAC) 8 % solution, Apply topically nightly. To nails (Patient not taking: Reported on 06/22/2018), Disp: 6.6 mL, Rfl: 5  ???  clonazePAM (KLONOPIN) 0.5 MG tablet, Take 1 tablet (0.5 mg total) by mouth Three (3) times a day as needed for anxiety. (Patient not taking: Reported on 06/22/2018), Disp: 3 tablet, Rfl: 0  ???  escitalopram oxalate (LEXAPRO)  10 MG tablet, Take 1 tablet by mouth daily at 0600., Disp: , Rfl: 1  ???  fluticasone (FLONASE) 50 mcg/actuation nasal spray, 2 sprays by Each Nare route daily., Disp: 3 Bottle, Rfl: 3  ???  ondansetron (ZOFRAN) 4 MG tablet, Take 4 mg by mouth every eight (8) hours as needed for nausea., Disp: , Rfl:     Surgical History:  Past Surgical History:   Procedure Laterality Date   ??? CARDIAC ELECTROPHYSIOLOGY MAPPING AND ABLATION  2013    PVI, roof line, CFAEs, CTI line   ??? CARDIAC ELECTROPHYSIOLOGY MAPPING AND ABLATION  2016    re-do roof line, new mitral isthmus line   ??? CHOLECYSTECTOMY     ??? HYSTERECTOMY     ??? ORTHOPEDIC SURGERY     ??? PR REMV CATARACT EXTRACAP,INSERT LENS Right 02/26/2016    Procedure: EXTRACAPSULAR CATARACT REMOVAL W/INSERTION OF INTRAOCULAR LENS PROSTHESIS, MANUAL OR MECHANICAL TECHNIQUE OD ZCB00 23.0 and ZA9003 22.0 and 21.5 ;bimanual, 2.4 mm keratome, 1.2 x 1.4 mm trapezoid blade;  Surgeon: Jeannene Patella, MD;  Location: [facility] OR [facility];  Service: Ophthalmology   ??? PR REMV CATARACT EXTRACAP,INSERT LENS Left    ??? PR REMV CATARACT EXTRACAP,INSERT LENS Left 03/18/2016    Procedure: EXTRACAPSULAR CATARACT REMOVAL W/INSERTION OF INTRAOCULAR LENS PROSTHESIS, MANUAL OR MECHANICAL TECHNIQUE OS ZCB00 23.0 and ZA9003 21.5 and 21.0; bimanual, 2.4 mm keratome, 1.2 x 1.4 mm trapezoid blade;  Surgeon: Jeannene Patella, MD;  Location: [facility];  Service: Ophthalmology   ??? SKIN BIOPSY         Social History:  Social History     Tobacco Use   ??? Smoking status: Former Smoker     Packs/day: 1.00     Years: 43.00     Pack years: 43.00     Types: Cigarettes   ??? Smokeless tobacco: Never Used   Substance Use Topics   ??? Alcohol use: No     Alcohol/week: 0.0 oz   ??? Drug use: No       Family History:  Family History   Problem Relation Age of Onset   ??? Cancer Mother    ??? Cataracts Mother    ??? Cancer Father    ??? Diabetes Brother    ??? Glaucoma Maternal Aunt    ??? Macular degeneration Maternal Aunt    ??? Anesthesia problems Neg Hx    ??? Broken bones Neg Hx    ??? Clotting disorder Neg Hx    ??? Collagen disease Neg Hx    ??? Dislocations Neg Hx    ??? Fibromyalgia Neg Hx    ??? Gout Neg Hx    ??? Hemophilia Neg Hx    ??? Osteoporosis Neg Hx    ??? Rheumatologic disease Neg Hx    ??? Scoliosis Neg Hx    ??? Severe sprains Neg Hx    ??? Sickle cell anemia Neg Hx    ??? Spinal Compression Fracture Neg Hx    ??? Melanoma Neg Hx    ??? Basal cell carcinoma Neg Hx    ??? Squamous cell carcinoma Neg Hx    ??? Stroke Neg Hx    ??? Thyroid disease Neg Hx    ??? Strabismus Neg Hx    ??? Retinal detachment Neg Hx    ??? Hypertension Neg Hx    ??? Blindness Neg Hx    ??? Amblyopia Neg Hx        Objective   Vitals:  06/22/18 1522   BP: 119/73   BP Site: R Arm   BP Position: Sitting   BP Cuff Size: Medium   Pulse: 77   Temp: 36.4 ??C   TempSrc: Oral   Weight: 99.8 kg (220 lb)   Height: 168.9 cm (5' 6.5)       Physical Exam  General: well appearing, no acute distress  Eyes: PERRLA, EOMI, sclera anicteric  ENT: MMM.  Oropharynx without any erythema or exudate.  No oropharyngeal exudates or ulcerations  Neck: supple, no JVD.  No cervical lymphadenopathy  Cardiovascular: Normal Rate, Normal Heart Sounds, Normal Rhythm and No Murmurs  Pulmonary:Normal Breath Sounds Bilaterally and No chest Tenderness  Abdomen:soft, nontender, positive bowel sounds  Skin: no rash, lesions, breakdown. Normal turgor and elasticity.  Extremities: Warm and well perfused, no cyanosis, clubbing or edema  Musculoskeletal:Shoulder has good range of motion.  No swelling or tenderness noted in her hands bilaterally.  Good range of motion.  -Tenderness noted in bilateral knees.  Tenderness noted in second and third MCPs bilaterally.  Neurologic: Cranial nerves II-XII grossly intact, strength 5/5 bilaterally.  Normal sensation  Psychiatric: Normal mood and affect.          Test Results  Appointment on 06/03/2018   Component Date Value   ??? Creatinine 06/03/2018 0.87    ??? EGFR MDRD Af Amer 06/03/2018 >=60    ??? EGFR MDRD Non Af Amer 06/03/2018 >=60    ??? ALT 06/03/2018 20    ??? AST 06/03/2018 39*   ??? WBC 06/03/2018 6.2    ??? RBC 06/03/2018 3.95*   ??? HGB 06/03/2018 10.7*   ??? HCT 06/03/2018 34.5*   ??? MCV 06/03/2018 87.4    ??? [facility] 06/03/2018 27.1    ??? MCHC 06/03/2018 31.0    ??? RDW 06/03/2018 19.4*   ??? MPV 06/03/2018 8.8    ??? Platelet 06/03/2018 290    ??? Neutrophils % 06/03/2018 69.7    ??? Lymphocytes % 06/03/2018 19.6    ??? Monocytes % 06/03/2018 6.5    ??? Eosinophils % 06/03/2018 1.0    ??? Basophils % 06/03/2018 0.4    ??? Absolute Neutrophils 06/03/2018 4.3    ??? Absolute Lymphocytes 06/03/2018 1.2*   ??? Absolute Monocytes 06/03/2018 0.4    ??? Absolute Eosinophils 06/03/2018 0.1    ??? Absolute Basophils 06/03/2018 0.0    ??? Large Unstained Cells 06/03/2018 3    ??? Microcytosis 06/03/2018 Slight*   ??? Anisocytosis 06/03/2018 Moderate*   ??? Hypochromasia 06/03/2018 Marked*   ??? Smear Review Comments 06/03/2018 See Comment*

## 2018-06-29 NOTE — Unmapped (Signed)
Patient was receiving Humira at [facility].  Humira CF script sent to [facility] recently and script is too early to fill.  Reached out to see if patient is interested in changing pharmacy to [facility].  Left VM requesting call back.     Chelsea Aus

## 2018-07-06 MED ORDER — ADALIMUMAB PEN CITRATE FREE 40 MG/0.4 ML
SUBCUTANEOUS | 1 refills | 0 days | Status: CP
Start: 2018-07-06 — End: 2018-12-25

## 2018-07-06 NOTE — Unmapped (Signed)
Patient was receiving Humira at [facility].  Humira CF script sent to [facility] recently and script is too early to fill.  She would like to continue receiving med through [facility].  Will send Humira CF formulation to avoid issue with latex allergy.     Chelsea Aus

## 2018-07-23 ENCOUNTER — Ambulatory Visit (INDEPENDENT_AMBULATORY_CARE_PROVIDER_SITE_OTHER): Payer: Medicare Other | Admitting: Family

## 2018-07-23 ENCOUNTER — Ambulatory Visit: Payer: Self-pay

## 2018-07-23 ENCOUNTER — Encounter: Payer: Self-pay | Admitting: Family

## 2018-07-23 VITALS — BP 126/78 | HR 99 | Temp 98.1°F | Resp 17 | Ht 66.0 in | Wt 225.0 lb

## 2018-07-23 DIAGNOSIS — R21 Rash and other nonspecific skin eruption: Secondary | ICD-10-CM | POA: Diagnosis not present

## 2018-07-23 DIAGNOSIS — J438 Other emphysema: Secondary | ICD-10-CM

## 2018-07-23 DIAGNOSIS — L509 Urticaria, unspecified: Secondary | ICD-10-CM | POA: Diagnosis not present

## 2018-07-23 DIAGNOSIS — D649 Anemia, unspecified: Secondary | ICD-10-CM

## 2018-07-23 MED ORDER — RANITIDINE HCL 150 MG PO TABS: 150.0000 mg | ORAL_TABLET | Freq: Two times a day (BID) | ORAL | 1 refills | Status: DC

## 2018-07-23 NOTE — Progress Notes (Signed)
Subjective:    Patient ID: Brandy Armstrong, female    DOB: September 28, 1946, 72 y.o.   MRN: [id number]  CC: Brandy Armstrong is a 72 y.o. female who presents today for an acute visit.    HPI:  Complaint of left-sided facial swelling, puffiness under the eye, last night, improved.  Occurred while at visitation last night. Started to new mositurer cream to see if helps, some relief.   Spotches of redness, improved today. Very itchy.    She reports a rash behind her left ear.  Tried lidocaine ointment to the left side of her face last night without relief.  Took one dose of Claritin, a dose of prednisone, triamcinolone however couldn't tell a difference.    Only new medication is that she started Humira again.  Denies any swelling of legs lips tongue, or throat.    Did note to triage that she was feeling sleepy and had someone drive her to the office today.  A month ago, itching was eye brows. Notes that itching today is in left side of face and neck.   H/o atrial fib  H/o COPD- Notes coughing episode earlier today, resolved. Describes dry cough.  No congestion. SOB is at baseline. No wheezing. No CP. Using symbicort. used rescue inhaler couple of days ago  Approved from [place] assistance         HISTORY:  Past Medical History:  Diagnosis Date  . Atrial fibrillation [facility])    a. s/p ablation 01/2012 in [facility] by Dr. Boyd Kerbs;  b. On sotalol & Xarelto;  c. 02/2014 Echo: EF 50-55%, mild conc LVH, nl LA size/structure;  d. Recurrent afib 8/15 & 08/29/2014.  . Barretts esophagus   . Chest pain    a. 02/2014 Myoview: Ef 50%, no ischemia.  . Colon polyps   . COPD (chronic obstructive pulmonary disease) ([place])   . COPD (chronic obstructive pulmonary disease) ([place])   . Depression with anxiety   . GERD (gastroesophageal reflux disease)   . Neuropathy   . Rectal fistula   . Rheumatoid arthritis ([place])    On methotrexate and orencia  . Urinary incontinence   . Vitamin D deficiency     Past Surgical History:  Procedure Laterality Date  . ABDOMINAL HYSTERECTOMY  1992  . CARDIAC ELECTROPHYSIOLOGY STUDY AND ABLATION  2013  . CHOLECYSTECTOMY  1987  . COLONOSCOPY N/A 05/08/2015   Procedure: COLONOSCOPY;  Surgeon: Manya Silvas, MD;  Location: [facility] ENDOSCOPY;  Service: Endoscopy;  Laterality: N/A;  . ESOPHAGOGASTRODUODENOSCOPY N/A 05/06/2015   Procedure: ESOPHAGOGASTRODUODENOSCOPY (EGD);  Surgeon: Lollie Sails, MD;  Location: [facility] ENDOSCOPY;  Service: Endoscopy;  Laterality: N/A;  . OOPHORECTOMY    . oophrectomy Bilateral 1992   Family History  Problem Relation Age of Onset  . Depression Mother   . Pancreatic cancer Mother   . Colon cancer Father   . Breast cancer Sister 47  . Diabetes Brother   . Breast cancer Cousin   . Neuropathy Neg Hx     Allergies: Latex Current Outpatient Medications on File Prior to Visit  Medication Sig Dispense Refill  . albuterol (PROVENTIL HFA;VENTOLIN HFA) 108 (90 Base) MCG/ACT inhaler Inhale 1-2 puffs into the lungs every 4 (four) hours as needed for wheezing or shortness of breath. 1 Inhaler 10  . budesonide-formoterol (SYMBICORT) 160-4.5 MCG/ACT inhaler INHALE 2 PUFFS TWO (2) TIMES A DAY. 3 Inhaler 1  . cyanocobalamin (,VITAMIN B-12,) 1000 MCG/ML injection 1000 mcg (1 mg) injection once  per week for four weeks, followed by 1000 mcg injection once per month. 1 mL 15  . cyclobenzaprine (FLEXERIL) 5 MG tablet Take 1 tablet (5 mg total) by mouth at bedtime. 30 tablet 1  . ELIQUIS 5 MG TABS tablet Take 1 tablet (5 mg total) by mouth 2 (two) times daily. 180 tablet 3  . esomeprazole (NEXIUM) 40 MG capsule TAKE ONE CAPSULE BY MOUTH DAILY AT 12 NOON 30 capsule 0  . ferrous sulfate 325 (65 FE) MG EC tablet Take 1 tablet (325 mg total) by mouth 2 (two) times daily with a meal. 60 tablet 3  . furosemide (LASIX) 20 MG tablet Take 1 tablet (20 mg total) by mouth daily as needed. 30 tablet 3  . gabapentin (NEURONTIN) 300 MG capsule Take 1  capsule (300 mg total) by mouth 3 (three) times daily. 60 capsule 3  . hydroxychloroquine (PLAQUENIL) 200 MG tablet Take 200 mg by mouth.    . lidocaine (XYLOCAINE) 5 % ointment Apply 1 application topically as needed.    . loratadine (CLARITIN) 10 MG tablet Take 1 tablet (10 mg total) by mouth daily. 30 tablet 11  . propranolol ER (INDERAL LA) 80 MG 24 hr capsule Take 1 capsule (80 mg total) by mouth daily. 90 capsule 3  . SPIRIVA RESPIMAT 2.5 MCG/ACT AERS 2 PUFFS BY MOUTH DAILY. 3 Inhaler 1  . Syringe/Needle, Disp, (SYRINGE 3CC/25GX5/8") 25G X 5/8" 3 ML MISC Use as directed 5 each 0  . Tofacitinib Citrate 5 MG TABS Take 5 mg by mouth.    . triamcinolone (KENALOG) 0.025 % cream Apply 1 application topically 2 (two) times daily. 15 g 0  . HUMIRA PEN 40 MG/0.4ML PNKT      No current facility-administered medications on file prior to visit.     Social History   Tobacco Use  . Smoking status: Former Smoker    Packs/day: 1.00    Years: 40.00    Pack years: 40.00    Last attempt to quit: 12/16/2011    Years since quitting: 6.6  . Smokeless tobacco: Never Used  Substance Use Topics  . Alcohol use: Not Currently    Alcohol/week: 0.0 oz    Comment: Occasionally has a drink  . Drug use: No    Review of Systems  Constitutional: Negative for chills and fever.  HENT: Positive for facial swelling. Negative for ear pain, hearing loss and sore throat.   Eyes: Positive for itching. Negative for photophobia, pain, discharge, redness and visual disturbance.  Respiratory: Positive for cough and shortness of breath (at baseline). Negative for wheezing.   Cardiovascular: Negative for chest pain and palpitations.  Gastrointestinal: Negative for nausea and vomiting.  Skin: Positive for color change and rash.      Objective:    BP 126/78 (BP Location: Left Arm, Patient Position: Sitting, Cuff Size: Normal)   Pulse 99   Temp 98.1 F (36.7 C) (Oral)   Resp 17   Ht 5\' 6"  (1.676 m)   Wt 225 lb  (102.1 kg)   SpO2 97%   BMI 36.32 kg/m    Physical Exam  Constitutional: She appears well-developed and well-nourished.  HENT:  Head: Normocephalic and atraumatic.  Right Ear: Hearing, tympanic membrane, external ear and ear canal normal. No drainage, swelling or tenderness. No foreign bodies. Tympanic membrane is not erythematous and not bulging. No middle ear effusion. No decreased hearing is noted.  Left Ear: Hearing, tympanic membrane, external ear and ear canal normal. No drainage,  swelling or tenderness. No foreign bodies. Tympanic membrane is not erythematous and not bulging.  No middle ear effusion. No decreased hearing is noted.  Nose: Nose normal. No rhinorrhea. Right sinus exhibits no maxillary sinus tenderness and no frontal sinus tenderness. Left sinus exhibits no maxillary sinus tenderness and no frontal sinus tenderness.  Mouth/Throat: Uvula is midline, oropharynx is clear and moist and mucous membranes are normal. No oropharyngeal exudate, posterior oropharyngeal edema, posterior oropharyngeal erythema or tonsillar abscesses.  Eyes: Conjunctivae are normal.  Cardiovascular: Regular rhythm, normal heart sounds and normal pulses.  Pulmonary/Chest: Effort normal and breath sounds normal. She has no wheezes. She has no rhonchi. She has no rales.  Lymphadenopathy:       Head (right side): No submental, no submandibular, no tonsillar, no preauricular, no posterior auricular and no occipital adenopathy present.       Head (left side): No submental, no submandibular, no tonsillar, no preauricular, no posterior auricular and no occipital adenopathy present.    She has no cervical adenopathy.  Neurological: She is alert.  Skin: Skin is warm and dry.  Pruritic, annular scattered patch of erythema noted over left cheek approx 3cm in diameter.  No facial swelling appreciated.  No rash or skin lesions seen behind left ear.  No lesions seen over tongue, posterior pharynx.  Psychiatric: She  has a normal mood and affect. Her speech is normal and behavior is normal. Thought content normal.  Vitals reviewed.      Assessment & Plan:   Problem List Items Addressed This Visit      Respiratory   COPD (chronic obstructive pulmonary disease) ([place])    Stable at baseline today.  She did note an episode of coughing earlier today ( resolved) which I am unsure etiology of this.  I advised her to to ensure she is on her maintenance medication which she is.  She is not using her rescue medication excessively.  Advised continue close vigilance especially in the setting of uticaria, or possibly adverse effect from Humira.  Patient will maintain vigilance        Musculoskeletal and Integument   Hives - Primary    Improved.  Patient is not in any acute respiratory distress.  She is not labored in speech.  Her SaO2 97%.  Her heart rate is  fluctuating on exam in the high 90s, as high as 125 when she initally arrived to office.  Patient is a history of atrial fib.  Suspect also she is a bit anxious today in regards to intensely puritic rash.  No known etiology for rash at this time however I suspect visit in June 2019 regarding rash may have been a similar etiology, raising suspicion for chronic uticaria.   I had a long discussion with patient she is just recently restarted Humira.  There is a 10% associated adverse effect of rash.  Advised her to speak with her rheumatologist prior to taking next dose of Humira as she may opt to discontinue, or change medication.  Patient verbalized understanding of this. Ultimately, I suspect emotional stress in the setting of her cousin's death may be contributory. This patient appears stable at this time, we jointly agreed that a histamine blockade is appropriate.  Education  regarding this is been given to patient.  Patient will remain vigilant and if any concerns of difficulty breathing, lip, tongue swelling, patient is to call 911. Patient will let me know how  she is doing.      Relevant Medications  ranitidine (ZANTAC) 150 MG tablet     Other   Anemia    Anemia had improved as of 1 month ago however patient has not seen [facility] for consult due to financial reasons.  Patient explained to me that she has approval from [place] assistance program.  I have [place] nurse practitioner over at [place] clinic to see how can facilitate her consult since this has been approved.  I will await hearing from her.  I advised patient as well to call GI to move this process along she is quite overdue for this consult.  Will follow           I am having Sherman Lipuma. Wedin "Arriana Lohmann" start on ranitidine. I am also having her maintain her hydroxychloroquine, lidocaine, Tofacitinib Citrate, albuterol, esomeprazole, furosemide, ELIQUIS, propranolol ER, cyclobenzaprine, gabapentin, cyanocobalamin, SYRINGE 3CC/25GX5/8", budesonide-formoterol, SPIRIVA RESPIMAT, ferrous sulfate, loratadine, triamcinolone, and HUMIRA PEN.   Meds ordered this encounter  Medications  . ranitidine (ZANTAC) 150 MG tablet    Sig: Take 1 tablet (150 mg total) by mouth 2 (two) times daily.    Dispense:  60 tablet    Refill:  1    Order Specific Question:   Supervising Provider    Answer:   Crecencio Mc [[id number]]    Return precautions given.   Risks, benefits, and alternatives of the medications and treatment plan prescribed today were discussed, and patient expressed understanding.   Education regarding symptom management and diagnosis given to patient on AVS.  Continue to follow with [name], FNP for routine health maintenance.   [name] and I agreed with plan.   Mable Paris, FNP

## 2018-07-23 NOTE — Patient Instructions (Signed)
Hives of unknown cause- Suspect as discussed humira may be contributory, please discuss with your  Rheumatology PRIOR to next dose.   Also, suspect emotions may be playing a role.   Please follow the below and as discussed, please call 911 if ANY shortness of breath, trouble swallowing, lip or tongue swelling.   Tips on Aggravating factors -    These include:  ?Physical factors -  As an example, heat (hot showers, extreme humidity) is a common trigger for many , and tight clothing or straps can also aggravate symptoms.   ?Anti-inflammatory medications - Nonsteroidal anti-inflammatory drugs (NSAIDs) worsen symptoms   ?Stress - Patients often report more severe symptoms during periods of physical or psychologic stress   ?Variations in dietary habits and alcohol - Although food allergy is a rare cause of, some patients will report that variations in diet, particularly rich meals or spicy foods, will aggravate symptoms. Alcohol also aggravates symptoms in some.  If hives do not improve, please let me know and remember :   Histamine 1 blocker - Either benadryl every 8 hours (may be different dosing/duration depending on formulation, please follow directions on bottle)                                         OR  claritin or zyrtec once daily  Histamine 2 blocker- Pepcid AC or Zantac twice a day

## 2018-07-23 NOTE — Telephone Encounter (Signed)
FYI patient seeing you at 3:30

## 2018-07-23 NOTE — Telephone Encounter (Signed)
Phone call from pt. To report possible allergic reaction  Reported she has swelling of left side of face that involves  puffiness under the left eye, left side of cheek and under left ear.  Also, stated there are bumps under the left ear.  Reported area of swelling has a blotchy red appearance, and is warm.  C/o itching of left face, left neck, and down to chest.  Reported the itching seems to be getting worse.  Reported this all seems to have started since her eyes were itching last week.  Reported she applied Lidocaine ointment last night to the left side of face.  This morning has taken a Loratadine, a dose of  Prednisone, and has applied Triamcinolone cream to the affected area.  Stated she started Humara again, and rec'd her 1st injection last Wednesday, 7/17.  Reported the Humara is the only change in medication.  Denied any swelling of lips, tongue, or throat.  Appt. Given for 3:30 PM today with PCP. Advised not to take any more antihistamine or apply anything else topically to affected area, until seen in the office.  Advised to have someone drive her to the appt., as pt. C/o feeling sleepy.   Care advice given per protocol.  Verb. Understanding.     Reason for Disposition . [1] Swelling is red AND [2] very painful to touch    Stated swelling under left eye and left side of cheek, to beneath left ear; has a blotchy red appearance and "bumps" under left ear; area described as warm; c/o itching that is worsening.  Answer Assessment - Initial Assessment Questions 1. ONSET: "When did the swelling start?" (e.g., minutes, hours, days)     Initial sx. Was itching and has swelling of left face   2. LOCATION: "What part of the face is swollen?"     Left side 3. SEVERITY: "How swollen is it?"     Left face, involving the left cheek, under eye, and under ear 4. ITCHING: "Is there any itching?" If so, ask: "How much?"   (Scale 1-10; mild, moderate or severe)     moderate 5. PAIN: "Is the swelling painful  to touch?" If so, ask: "How painful is it?"   (Scale 1-10; mild, moderate or severe)     Denied 6. FEVER: "Do you have a fever?" If so, ask: "What is it, how was it measured, and when did it start?"     The local area of swelling feels warm 7. CAUSE: "What do you think is causing the face swelling?"    Unknown 8. RECURRENT SYMPTOM: "Have you had face swelling before?" If so, ask: "When was the last time?" "What happened that time?"     Had a facial rash about one month ago 9. OTHER SYMPTOMS: "Do you have any other symptoms?" (e.g., toothache, leg swelling)     Denied swelling of lips , tongue, or throat 10. PREGNANCY: "Is there any chance you are pregnant?" "When was your last menstrual period?"       n/a  Protocols used: [facility]

## 2018-07-24 NOTE — Assessment & Plan Note (Signed)
Anemia had improved as of 1 month ago however patient has not seen [facility] for consult due to financial reasons.  Patient explained to me that she has approval from [place] assistance program.  I have [place] nurse practitioner over at [place] clinic to see how can facilitate her consult since this has been approved.  I will await hearing from her.  I advised patient as well to call GI to move this process along she is quite overdue for this consult.  Will follow

## 2018-07-24 NOTE — Assessment & Plan Note (Signed)
Improved.  Patient is not in any acute respiratory distress.  She is not labored in speech.  Her SaO2 97%.  Her heart rate is  fluctuating on exam in the high 90s, as high as 125 when she initally arrived to office.  Patient is a history of atrial fib.  Suspect also she is a bit anxious today in regards to intensely puritic rash.  No known etiology for rash at this time however I suspect visit in June 2019 regarding rash may have been a similar etiology, raising suspicion for chronic uticaria.   I had a long discussion with patient she is just recently restarted Humira.  There is a 10% associated adverse effect of rash.  Advised her to speak with her rheumatologist prior to taking next dose of Humira as she may opt to discontinue, or change medication.  Patient verbalized understanding of this. Ultimately, I suspect emotional stress in the setting of her cousin's death may be contributory. This patient appears stable at this time, we jointly agreed that a histamine blockade is appropriate.  Education  regarding this is been given to patient.  Patient will remain vigilant and if any concerns of difficulty breathing, lip, tongue swelling, patient is to call 911. Patient will let me know how she is doing.

## 2018-07-24 NOTE — Assessment & Plan Note (Signed)
Stable at baseline today.  She did note an episode of coughing earlier today ( resolved) which I am unsure etiology of this.  I advised her to to ensure she is on her maintenance medication which she is.  She is not using her rescue medication excessively.  Advised continue close vigilance especially in the setting of uticaria, or possibly adverse effect from Humira.  Patient will maintain vigilance

## 2018-08-13 MED ORDER — PREDNISONE 10 MG TABLET
ORAL_TABLET | 1 refills | 0 days | Status: CP
Start: 2018-08-13 — End: ?

## 2018-08-14 ENCOUNTER — Telehealth: Payer: Self-pay | Admitting: Family

## 2018-08-14 DIAGNOSIS — D649 Anemia, unspecified: Secondary | ICD-10-CM

## 2018-08-14 NOTE — Telephone Encounter (Signed)
Call pt  Does she have an appt with [facility]?  Has she called [facility] since her financials have  Been approved?  Ask her to bring those hemocult cards back to [place].

## 2018-08-17 ENCOUNTER — Ambulatory Visit: Payer: Medicare Other | Admitting: Family

## 2018-08-18 NOTE — Telephone Encounter (Signed)
Left voice mail for patient to call back ok for [facility] to speak to patient    

## 2018-08-18 NOTE — Telephone Encounter (Signed)
FYI

## 2018-08-18 NOTE — Telephone Encounter (Signed)
Pt is calling back and [facility] is waiting for something from [place].Pt is not sure of what.  Pt does not have and appt with [facility] yet   Pt has not perform the stool samples yet

## 2018-08-24 ENCOUNTER — Encounter: Payer: Self-pay | Admitting: Family

## 2018-08-24 ENCOUNTER — Ambulatory Visit (INDEPENDENT_AMBULATORY_CARE_PROVIDER_SITE_OTHER): Payer: Medicare Other | Admitting: Family

## 2018-08-24 VITALS — BP 118/90 | HR 103 | Temp 98.9°F | Resp 15 | Ht 66.0 in | Wt 223.1 lb

## 2018-08-24 DIAGNOSIS — F339 Major depressive disorder, recurrent, unspecified: Secondary | ICD-10-CM

## 2018-08-24 DIAGNOSIS — D649 Anemia, unspecified: Secondary | ICD-10-CM

## 2018-08-24 DIAGNOSIS — K219 Gastro-esophageal reflux disease without esophagitis: Secondary | ICD-10-CM

## 2018-08-24 LAB — FERRITIN: FERRITIN: 27.8 ng/mL (ref 10.0–291.0)

## 2018-08-24 LAB — CBC WITH DIFFERENTIAL/PLATELET
Basophils Absolute: 0 10*3/uL (ref 0.0–0.1)
Basophils Relative: 0.4 % (ref 0.0–3.0)
EOS PCT: 0.7 % (ref 0.0–5.0)
Eosinophils Absolute: 0 10*3/uL (ref 0.0–0.7)
HCT: 33.5 % — ABNORMAL LOW (ref 36.0–46.0)
Hemoglobin: 10.5 g/dL — ABNORMAL LOW (ref 12.0–15.0)
LYMPHS ABS: 0.7 10*3/uL (ref 0.7–4.0)
Lymphocytes Relative: 11.5 % — ABNORMAL LOW (ref 12.0–46.0)
MCHC: 31.3 g/dL (ref 30.0–36.0)
MCV: 83.1 fl (ref 78.0–100.0)
MONO ABS: 0.4 10*3/uL (ref 0.1–1.0)
Monocytes Relative: 5.8 % (ref 3.0–12.0)
NEUTROS PCT: 81.6 % — AB (ref 43.0–77.0)
Neutro Abs: 5.2 10*3/uL (ref 1.4–7.7)
Platelets: 242 10*3/uL (ref 150.0–400.0)
RBC: 4.04 Mil/uL (ref 3.87–5.11)
RDW: 17.1 % — AB (ref 11.5–15.5)
WBC: 6.4 10*3/uL (ref 4.0–10.5)

## 2018-08-24 LAB — IBC PANEL
Iron: 48 ug/dL (ref 42–145)
Saturation Ratios: 11.7 % — ABNORMAL LOW (ref 20.0–50.0)
Transferrin: 293 mg/dL (ref 212.0–360.0)

## 2018-08-24 LAB — B12 AND FOLATE PANEL
Folate: 7.9 ng/mL (ref >5.9)
Vitamin B-12: 652 pg/mL (ref 211–911)

## 2018-08-24 MED ORDER — SERTRALINE HCL 25 MG PO TABS: 25.0000 mg | ORAL_TABLET | Freq: Every day | ORAL | 1 refills | Status: DC

## 2018-08-24 NOTE — Patient Instructions (Signed)
Please discuss reflux and history of barrett's esophagus with Brandy Armstrong on Friday. You may need upper scope  Stool cards  Labs today   Start zoloft again, 25mg  at bedtime.    Barrett Esophagus Barrett esophagus occurs when the tissue that lines the esophagus changes or becomes damaged. The esophagus is the tube that carries food from the throat to the stomach. With Barrett esophagus, the cells that line the esophagus are replaced by cells that are similar to the lining of the intestines (intestinal metaplasia). Barrett esophagus itself may not cause any symptoms. However, many people who have Barrett esophagus also have gastroesophageal reflux disease (GERD), which may cause symptoms such as heartburn. Treatment may include medicines, procedures to destroy the abnormal cells, or surgery. Over time, a few people with this condition may develop cancer of the esophagus. What are the causes? The exact cause of this condition is not known. In some cases, the condition develops from damage to the lining of the esophagus caused by GERD. GERD occurs when stomach acids flow up from the stomach into the esophagus. Frequent symptoms of GERD may cause intestinal metaplasia or cause cell changes (dysplasia). What increases the risk? The following factors may make you more likely to develop this condition:  Having GERD.  Being any of the following: ? Female. ? White (Caucasian). ? Obese. ? Older than 50.  Having a hiatal hernia.  Smoking.  What are the signs or symptoms? People with Barrett esophagus often have no symptoms. However, many people with this condition also have GERD. Symptoms of GERD may include:  Heartburn.  Difficulty swallowing.  Dry cough.  How is this diagnosed? Barrett esophagus may be diagnosed with an exam called an upper gastrointestinal endoscopy. During this exam, a thin, flexible tube (endoscope) is passed down your esophagus. The endoscope has a light and camera on  the end of it. Your health care provider uses the endoscope to view the inside of your esophagus. During the exam, several tissue samples will be removed (biopsy) from your esophagus so they can be checked for intestinal metaplasia or dysplasia. How is this treated? Treatment for this condition may include:  Medicines (proton pump inhibitors, or PPIs) to decrease or stop GERD.  Periodic endoscopic exams to make sure that cancer is not developing.  A procedure or surgery for dysplasia. This may include: ? Endoscopic removal or destruction of abnormal cells. ? Removal of part of the esophagus (esophagectomy).  Follow these instructions at home: Eating and drinking  Eat more fruits and vegetables.  Avoid fatty foods.  Eat small, frequent meals instead of large meals.  Avoid foods that cause heartburn. These foods include: ? Coffee and alcoholic drinks. ? Tomatoes and foods made with tomatoes. ? Greasy or spicy foods. ? Chocolate and peppermint. General instructions   Take over-the-counter and prescription medicines only as told by your health care provider.  Do not use any tobacco products, such as cigarettes, chewing tobacco, and e-cigarettes. If you need help quitting, ask your health care provider.  If your health care provider is treating you for GERD, make sure you follow all instructions and take medicines as directed.  Keep all follow-up visits as told by your health care provider. This is important. Contact a health care provider if:  You have heartburn or GERD symptoms.  You have difficulty swallowing. Get help right away if:  You have chest pain.  You are unable to swallow.  You vomit blood or material that looks like coffee grounds.  Your stool (feces) is bright red or dark. This information is not intended to replace advice given to you by your health care provider. Make sure you discuss any questions you have with your health care provider. Document  Released: 03/07/2004 Document Revised: 05/23/2016 Document Reviewed: 09/28/2015 Elsevier Interactive Patient Education  [employer].

## 2018-08-24 NOTE — Progress Notes (Signed)
Subjective:    Patient ID: Brandy Armstrong, female    DOB: September 23, 1946, 72 y.o.   MRN: [id number]  CC: [name] is a 72 y.o. female who presents today for follow up.   HPI: Would like b12 and iron checked. Taking b12 [place] monthly. Not taking iron supplements.   Depression- 'feels more blue' . Still has no energy, motivation. Sleeps all the time. No anxiety. Friend passed away a week ago. sleeping all the time. Not taking zoloft and 'not sure why' as thinks felt better on zoloft. No si/hi.   Complains of occasional midline, epigastric pain,chronic.  She takes nexium for this. Had episode of reflux 2 days ago when laying bed in the morning.  Pain 'was not crushing.' Thinks 'my stomach.' Tried tums, baking soda without relief. Trying to burp at that time. Resolved on its own after couple of hours.  Denies exertional chest pain or pressure, numbness or tingling radiating to left arm or jaw, palpitations, dizziness, frequent headaches, changes in vision, or shortness of breath.    H/o barrett's esophagus. Feels hoarseness. No trouble swallowing.   She has appt scheduled with [facility] in 5 days.      Rash has resolved.    EGD and colonoscopy 2016  HISTORY:  Past Medical History:  Diagnosis Date  . Atrial fibrillation [facility])    a. s/p ablation 01/2012 in [facility] by Dr. Boyd Kerbs;  b. On sotalol & Xarelto;  c. 02/2014 Echo: EF 50-55%, mild conc LVH, nl LA size/structure;  d. Recurrent afib 8/15 & 08/29/2014.  . Barretts esophagus   . Chest pain    a. 02/2014 Myoview: Ef 50%, no ischemia.  . Colon polyps   . COPD (chronic obstructive pulmonary disease) ([place])   . COPD (chronic obstructive pulmonary disease) ([place])   . Depression with anxiety   . GERD (gastroesophageal reflux disease)   . Neuropathy   . Rectal fistula   . Rheumatoid arthritis ([place])    On methotrexate and orencia  . Urinary incontinence   . Vitamin D deficiency    Past Surgical History:  Procedure Laterality Date  .  ABDOMINAL HYSTERECTOMY  1992  . CARDIAC ELECTROPHYSIOLOGY STUDY AND ABLATION  2013  . CHOLECYSTECTOMY  1987  . COLONOSCOPY N/A 05/08/2015   Procedure: COLONOSCOPY;  Surgeon: Manya Silvas, MD;  Location: [facility] ENDOSCOPY;  Service: Endoscopy;  Laterality: N/A;  . ESOPHAGOGASTRODUODENOSCOPY N/A 05/06/2015   Procedure: ESOPHAGOGASTRODUODENOSCOPY (EGD);  Surgeon: Lollie Sails, MD;  Location: [facility] ENDOSCOPY;  Service: Endoscopy;  Laterality: N/A;  . OOPHORECTOMY    . oophrectomy Bilateral 1992   Family History  Problem Relation Age of Onset  . Depression Mother   . Pancreatic cancer Mother   . Colon cancer Father   . Breast cancer Sister 74  . Diabetes Brother   . Breast cancer Cousin   . Neuropathy Neg Hx     Allergies: Latex Current Outpatient Medications on File Prior to Visit  Medication Sig Dispense Refill  . albuterol (PROVENTIL HFA;VENTOLIN HFA) 108 (90 Base) MCG/ACT inhaler Inhale 1-2 puffs into the lungs every 4 (four) hours as needed for wheezing or shortness of breath. 1 Inhaler 10  . budesonide-formoterol (SYMBICORT) 160-4.5 MCG/ACT inhaler INHALE 2 PUFFS TWO (2) TIMES A DAY. 3 Inhaler 1  . cyanocobalamin (,VITAMIN B-12,) 1000 MCG/ML injection 1000 mcg (1 mg) injection once per week for four weeks, followed by 1000 mcg injection once per month. 1 mL 15  . cyclobenzaprine (FLEXERIL)  5 MG tablet Take 1 tablet (5 mg total) by mouth at bedtime. 30 tablet 1  . ELIQUIS 5 MG TABS tablet Take 1 tablet (5 mg total) by mouth 2 (two) times daily. 180 tablet 3  . esomeprazole (NEXIUM) 40 MG capsule TAKE ONE CAPSULE BY MOUTH DAILY AT 12 NOON 30 capsule 0  . furosemide (LASIX) 20 MG tablet Take 1 tablet (20 mg total) by mouth daily as needed. 30 tablet 3  . gabapentin (NEURONTIN) 300 MG capsule Take 1 capsule (300 mg total) by mouth 3 (three) times daily. 60 capsule 3  . HUMIRA PEN 40 MG/0.4ML PNKT     . hydroxychloroquine (PLAQUENIL) 200 MG tablet Take 200 mg by mouth.    . lidocaine  (XYLOCAINE) 5 % ointment Apply 1 application topically as needed.    . loratadine (CLARITIN) 10 MG tablet Take 1 tablet (10 mg total) by mouth daily. 30 tablet 11  . propranolol ER (INDERAL LA) 80 MG 24 hr capsule Take 1 capsule (80 mg total) by mouth daily. 90 capsule 3  . ranitidine (ZANTAC) 150 MG tablet Take 1 tablet (150 mg total) by mouth 2 (two) times daily. 60 tablet 1  . SPIRIVA RESPIMAT 2.5 MCG/ACT AERS 2 PUFFS BY MOUTH DAILY. 3 Inhaler 1  . Syringe/Needle, Disp, (SYRINGE 3CC/25GX5/8") 25G X 5/8" 3 ML MISC Use as directed 5 each 0  . triamcinolone (KENALOG) 0.025 % cream Apply 1 application topically 2 (two) times daily. 15 g 0  . ferrous sulfate 325 (65 FE) MG EC tablet Take 1 tablet (325 mg total) by mouth 2 (two) times daily with a meal. (Patient not taking: Reported on 08/24/2018) 60 tablet 3   No current facility-administered medications on file prior to visit.     Social History   Tobacco Use  . Smoking status: Former Smoker    Packs/day: 1.00    Years: 40.00    Pack years: 40.00    Last attempt to quit: 12/16/2011    Years since quitting: 6.6  . Smokeless tobacco: Never Used  Substance Use Topics  . Alcohol use: Not Currently    Alcohol/week: 0.0 standard drinks    Comment: Occasionally has a drink  . Drug use: No    Review of Systems  Constitutional: Positive for fatigue. Negative for chills and fever.  Respiratory: Negative for cough.   Cardiovascular: Negative for chest pain and palpitations.  Gastrointestinal: Negative for abdominal pain, constipation, nausea and vomiting.  Psychiatric/Behavioral: Positive for sleep disturbance. The patient is not nervous/anxious.       Objective:    BP 118/90 (BP Location: Left Arm, Patient Position: Sitting, Cuff Size: Normal)   Pulse (!) 103   Temp 98.9 F (37.2 C) (Oral)   Resp 15   Ht 5\' 6"  (1.676 m)   Wt 223 lb 2 oz (101.2 kg)   SpO2 97%   BMI 36.01 kg/m  BP Readings from Last 3 Encounters:  08/24/18 118/90    07/23/18 126/78  06/15/18 114/80   Wt Readings from Last 3 Encounters:  08/24/18 223 lb 2 oz (101.2 kg)  07/23/18 225 lb (102.1 kg)  06/15/18 220 lb 2 oz (99.8 kg)    Physical Exam  Constitutional: She appears well-developed and well-nourished.  Eyes: Conjunctivae are normal.  Cardiovascular: Normal rate, regular rhythm, normal heart sounds and normal pulses.  Pulmonary/Chest: Effort normal and breath sounds normal. She has no wheezes. She has no rhonchi. She has no rales.  Neurological: She is alert.  Skin: Skin is warm and dry.  Psychiatric: She has a normal mood and affect. Her speech is normal and behavior is normal. Thought content normal.  Vitals reviewed.      Assessment & Plan:   Problem List Items Addressed This Visit      Digestive   GERD (gastroesophageal reflux disease)    Symptoms consistent with acid reflux.  Advised patient to continue Nexium, particularly in the setting of her history of Barrett's esophagus.  She is pending a GI work-up.  Will follow.        Other   Anemia - Primary    Patient has been taking B12 subcutaneous, she has not been on iron supplements.  Pending iron, B12 studies today.  She has an appointment with GI in 5 days.  She is yet to bring back stool cards.  Will closely follow GI's evaluatiokn.       Relevant Orders   B12 and Folate Panel   CBC with Differential/Platelet   Ferritin   IBC panel   Depression, recurrent ([place])    Worsen.  Patient is been off Zoloft.  She is also had 2 recent deaths.  Advised her to start back on Zoloft.  Also discussed with patient that she may benefit from a more activating SSRI.  We will can discuss that further after she is been seen by hematology, GI follow-up as suspect to that anemia is contributory.       Relevant Medications   sertraline (ZOLOFT) 25 MG tablet       I have discontinued Gabrelle C. Train "Jeannifer Viles"'s Tofacitinib Citrate. I am also having her start on sertraline.  Additionally, I am having her maintain her hydroxychloroquine, lidocaine, albuterol, esomeprazole, furosemide, ELIQUIS, propranolol ER, cyclobenzaprine, gabapentin, cyanocobalamin, SYRINGE 3CC/25GX5/8", budesonide-formoterol, SPIRIVA RESPIMAT, ferrous sulfate, loratadine, triamcinolone, HUMIRA PEN, and ranitidine.   Meds ordered this encounter  Medications  . sertraline (ZOLOFT) 25 MG tablet    Sig: Take 1 tablet (25 mg total) by mouth daily.    Dispense:  90 tablet    Refill:  1    Order Specific Question:   Supervising Provider    Answer:   Crecencio Mc [[id number]]    Return precautions given.   Risks, benefits, and alternatives of the medications and treatment plan prescribed today were discussed, and patient expressed understanding.   Education regarding symptom management and diagnosis given to patient on AVS.  Continue to follow with [name], FNP for routine health maintenance.   [name] and I agreed with plan.   Mable Paris, FNP

## 2018-08-24 NOTE — Telephone Encounter (Signed)
Discussed with patient during ov

## 2018-08-24 NOTE — Assessment & Plan Note (Signed)
Symptoms consistent with acid reflux.  Advised patient to continue Nexium, particularly in the setting of her history of Barrett's esophagus.  She is pending a GI work-up.  Will follow.

## 2018-08-24 NOTE — Assessment & Plan Note (Signed)
Worsen.  Patient is been off Zoloft.  She is also had 2 recent deaths.  Advised her to start back on Zoloft.  Also discussed with patient that she may benefit from a more activating SSRI.  We will can discuss that further after she is been seen by hematology, GI follow-up as suspect to that anemia is contributory.

## 2018-08-24 NOTE — Assessment & Plan Note (Signed)
Patient has been taking B12 subcutaneous, she has not been on iron supplements.  Pending iron, B12 studies today.  She has an appointment with GI in 5 days.  She is yet to bring back stool cards.  Will closely follow GI's evaluatiokn.

## 2018-08-26 ENCOUNTER — Encounter: Payer: Self-pay | Admitting: Family

## 2018-08-28 DIAGNOSIS — K219 Gastro-esophageal reflux disease without esophagitis: Secondary | ICD-10-CM | POA: Diagnosis not present

## 2018-08-28 DIAGNOSIS — M069 Rheumatoid arthritis, unspecified: Secondary | ICD-10-CM | POA: Diagnosis not present

## 2018-08-28 DIAGNOSIS — K5520 Angiodysplasia of colon without hemorrhage: Secondary | ICD-10-CM | POA: Diagnosis not present

## 2018-08-28 DIAGNOSIS — D509 Iron deficiency anemia, unspecified: Secondary | ICD-10-CM | POA: Diagnosis not present

## 2018-09-04 ENCOUNTER — Inpatient Hospital Stay: Payer: Medicare Other | Attending: Oncology

## 2018-09-04 ENCOUNTER — Telehealth: Payer: Self-pay

## 2018-09-04 DIAGNOSIS — E538 Deficiency of other specified B group vitamins: Secondary | ICD-10-CM | POA: Insufficient documentation

## 2018-09-04 DIAGNOSIS — D649 Anemia, unspecified: Secondary | ICD-10-CM

## 2018-09-04 DIAGNOSIS — I482 Chronic atrial fibrillation: Secondary | ICD-10-CM | POA: Insufficient documentation

## 2018-09-04 DIAGNOSIS — D509 Iron deficiency anemia, unspecified: Secondary | ICD-10-CM | POA: Diagnosis not present

## 2018-09-04 DIAGNOSIS — K5520 Angiodysplasia of colon without hemorrhage: Secondary | ICD-10-CM | POA: Diagnosis not present

## 2018-09-04 DIAGNOSIS — Z79899 Other long term (current) drug therapy: Secondary | ICD-10-CM | POA: Insufficient documentation

## 2018-09-04 DIAGNOSIS — M069 Rheumatoid arthritis, unspecified: Secondary | ICD-10-CM | POA: Insufficient documentation

## 2018-09-04 DIAGNOSIS — Z87891 Personal history of nicotine dependence: Secondary | ICD-10-CM | POA: Diagnosis not present

## 2018-09-04 DIAGNOSIS — Z7901 Long term (current) use of anticoagulants: Secondary | ICD-10-CM | POA: Diagnosis not present

## 2018-09-04 DIAGNOSIS — Z86010 Personal history of colonic polyps: Secondary | ICD-10-CM | POA: Insufficient documentation

## 2018-09-04 LAB — IRON AND TIBC
Iron: 181 ug/dL — ABNORMAL HIGH (ref 28–170)
SATURATION RATIOS: 52 % — AB (ref 10.4–31.8)
TIBC: 350 ug/dL (ref 250–450)
UIBC: 169 ug/dL

## 2018-09-04 LAB — CBC WITH DIFFERENTIAL/PLATELET
BASOS ABS: 0 10*3/uL (ref 0–0.1)
Basophils Relative: 0 %
EOS PCT: 1 %
Eosinophils Absolute: 0 10*3/uL (ref 0–0.7)
HEMATOCRIT: 32.3 % — AB (ref 35.0–47.0)
Hemoglobin: 10.3 g/dL — ABNORMAL LOW (ref 12.0–16.0)
LYMPHS ABS: 0.5 10*3/uL — AB (ref 1.0–3.6)
LYMPHS PCT: 8 %
MCH: 26.4 pg (ref 26.0–34.0)
MCHC: 31.8 g/dL — ABNORMAL LOW (ref 32.0–36.0)
MCV: 83.1 fL (ref 80.0–100.0)
Monocytes Absolute: 0.2 10*3/uL (ref 0.2–0.9)
Monocytes Relative: 4 %
NEUTROS ABS: 5.8 10*3/uL (ref 1.4–6.5)
Neutrophils Relative %: 87 %
Platelets: 213 10*3/uL (ref 150–440)
RBC: 3.89 MIL/uL (ref 3.80–5.20)
RDW: 18.5 % — ABNORMAL HIGH (ref 11.5–14.5)
WBC: 6.6 10*3/uL (ref 3.6–11.0)

## 2018-09-04 LAB — FERRITIN: FERRITIN: 38 ng/mL (ref 11–307)

## 2018-09-04 NOTE — Telephone Encounter (Signed)
Copied from [place] [id number]. Topic: Medicare AWV >> Sep 04, 2018  3:15 PM Judyann Munson wrote: Reason for CRM:  patient is requesting to schedule AWV anytime after 2om. Please advise

## 2018-09-06 ENCOUNTER — Other Ambulatory Visit: Payer: Self-pay | Admitting: Cardiovascular Disease

## 2018-09-07 ENCOUNTER — Other Ambulatory Visit: Payer: Self-pay

## 2018-09-07 ENCOUNTER — Inpatient Hospital Stay (HOSPITAL_BASED_OUTPATIENT_CLINIC_OR_DEPARTMENT_OTHER): Payer: Medicare Other | Admitting: Oncology

## 2018-09-07 ENCOUNTER — Encounter: Payer: Self-pay | Admitting: Oncology

## 2018-09-07 VITALS — BP 133/88 | HR 100 | Temp 98.3°F | Resp 18 | Wt 223.5 lb

## 2018-09-07 DIAGNOSIS — Z87891 Personal history of nicotine dependence: Secondary | ICD-10-CM

## 2018-09-07 DIAGNOSIS — Z7901 Long term (current) use of anticoagulants: Secondary | ICD-10-CM | POA: Diagnosis not present

## 2018-09-07 DIAGNOSIS — K5520 Angiodysplasia of colon without hemorrhage: Secondary | ICD-10-CM

## 2018-09-07 DIAGNOSIS — Z79899 Other long term (current) drug therapy: Secondary | ICD-10-CM | POA: Diagnosis not present

## 2018-09-07 DIAGNOSIS — E538 Deficiency of other specified B group vitamins: Secondary | ICD-10-CM | POA: Diagnosis not present

## 2018-09-07 DIAGNOSIS — D509 Iron deficiency anemia, unspecified: Secondary | ICD-10-CM

## 2018-09-07 DIAGNOSIS — D649 Anemia, unspecified: Secondary | ICD-10-CM

## 2018-09-07 DIAGNOSIS — R790 Abnormal level of blood mineral: Secondary | ICD-10-CM

## 2018-09-07 DIAGNOSIS — Z86010 Personal history of colonic polyps: Secondary | ICD-10-CM

## 2018-09-07 DIAGNOSIS — M069 Rheumatoid arthritis, unspecified: Secondary | ICD-10-CM

## 2018-09-07 DIAGNOSIS — I482 Chronic atrial fibrillation: Secondary | ICD-10-CM | POA: Diagnosis not present

## 2018-09-07 NOTE — Progress Notes (Signed)
Patient here for follow up

## 2018-09-07 NOTE — Progress Notes (Signed)
Hematology/Oncology Consult note [facility] Telephone:([phone] [phone]   Patient Care Team: [name], FNP as PCP - General (Family Medicine)  REFERRING PROVIDER: [name], FNP  REASON FOR VISIT Follow up for treatment of anemia and iron deficiency anemia.   HISTORY OF PRESENTING ILLNESS:  Brandy Armstrong is a  72 y.o.  female with PMH listed below who was referred to me for evaluation of anemia and elevated iron level.  Patient recently had lab work done which revealed anemia with hemoglobin count at 10.2 on 05/04/2018.  I reviewed patient's previous labs, Anemia is chronic, dated back to 2014. She recalls having 1 PRBC transfusion in the past and was told she has iron deficiency anemia. She takes oral OTC  iron supplements and anemia has improved.  Associated with fatigue, lack of energy. Denies weight loss, easy bruising, hematochezia, hemoptysis.  Last colonoscopy 2016 revealed polyps which were removed. No malignancy was found.  Last EGD: 2016 which showed gastritis.   Reviewed recent iron panel which showed elevated iron at 346, TSAT elevated at 89, ferritin 26. Denies any family history of hemachromatosis.  Recent B12 level showed decreased B12, she reports that she has been started on parental B12 injections with her PCP.  History RF, she takes plaquenil 200mg  daily.  History of Afib, takes Elqiuis 5mg  BID.   INTERVAL HISTORY [name] is a 72 y.o. female who has above history reviewed by me today presents for follow-up for management of iron deficiency anemia. Problems and complaints are listed below: #Anemia: Hemoglobin has been stable, most recent hemoglobin at 10.3.[name] #Fatigue: Chronic, at baseline. #Vitamin B12 deficiency, patient got parental B12 injection with her PCP.   Review of Systems  Constitutional: Positive for malaise/fatigue. Negative for chills, fever and weight loss.  HENT: Negative for  congestion, ear discharge, ear pain, nosebleeds, sinus pain and sore throat.   Eyes: Negative for double vision, photophobia, pain, discharge and redness.  Respiratory: Negative for cough, hemoptysis, sputum production, shortness of breath and wheezing.   Cardiovascular: Negative for chest pain, palpitations, orthopnea, claudication and leg swelling.  Gastrointestinal: Negative for abdominal pain, blood in stool, constipation, diarrhea, heartburn, melena, nausea and vomiting.  Genitourinary: Negative for dysuria, flank pain, frequency and hematuria.  Musculoskeletal: Negative for back pain, myalgias and neck pain.  Skin: Negative for itching and rash.  Neurological: Negative for dizziness, tingling, tremors, focal weakness, weakness and headaches.  Endo/Heme/Allergies: Negative for environmental allergies. Does not bruise/bleed easily.  Psychiatric/Behavioral: Negative for depression and hallucinations. The patient is not nervous/anxious.     MEDICAL HISTORY:  Past Medical History:  Diagnosis Date  . Atrial fibrillation [facility])    a. s/p ablation 01/2012 in [facility] by Dr. Boyd Kerbs;  b. On sotalol & Xarelto;  c. 02/2014 Echo: EF 50-55%, mild conc LVH, nl LA size/structure;  d. Recurrent afib 8/15 & 08/29/2014.  . Barretts esophagus   . Chest pain    a. 02/2014 Myoview: Ef 50%, no ischemia.  . Colon polyps   . COPD (chronic obstructive pulmonary disease) ([place])   . COPD (chronic obstructive pulmonary disease) ([place])   . Depression with anxiety   . GERD (gastroesophageal reflux disease)   . Neuropathy   . Rectal fistula   . Rheumatoid arthritis ([place])    On methotrexate and orencia  . Urinary incontinence   . Vitamin D deficiency     SURGICAL HISTORY: Past Surgical History:  Procedure Laterality Date  . ABDOMINAL  HYSTERECTOMY  1992  . CARDIAC ELECTROPHYSIOLOGY STUDY AND ABLATION  2013  . CHOLECYSTECTOMY  1987  . COLONOSCOPY N/A 05/08/2015   Procedure: COLONOSCOPY;  Surgeon: Manya Silvas, MD;  Location: [facility] ENDOSCOPY;  Service: Endoscopy;  Laterality: N/A;  . ESOPHAGOGASTRODUODENOSCOPY N/A 05/06/2015   Procedure: ESOPHAGOGASTRODUODENOSCOPY (EGD);  Surgeon: Lollie Sails, MD;  Location: [facility] ENDOSCOPY;  Service: Endoscopy;  Laterality: N/A;  . OOPHORECTOMY    . oophrectomy Bilateral 1992    SOCIAL HISTORY: Social History   Socioeconomic History  . Marital status: Single    Spouse name: Not on file  . Number of children: 0  . Years of education: 13  . Highest education level: Not on file  Occupational History  . Occupation: Retired  Scientific laboratory technician  . Financial resource strain: Not on file  . Food insecurity:    Worry: Not on file    Inability: Not on file  . Transportation needs:    Medical: Not on file    Non-medical: Not on file  Tobacco Use  . Smoking status: Former Smoker    Packs/day: 1.00    Years: 40.00    Pack years: 40.00    Last attempt to quit: 12/16/2011    Years since quitting: 6.7  . Smokeless tobacco: Never Used  Substance and Sexual Activity  . Alcohol use: Not Currently    Alcohol/week: 0.0 standard drinks    Comment: Occasionally has a drink  . Drug use: No  . Sexual activity: Never  Lifestyle  . Physical activity:    Days per week: Not on file    Minutes per session: Not on file  . Stress: Not on file  Relationships  . Social connections:    Talks on phone: Not on file    Gets together: Not on file    Attends religious service: Not on file    Active member of club or organization: Not on file    Attends meetings of clubs or organizations: Not on file    Relationship status: Not on file  . Intimate partner violence:    Fear of current or ex partner: Not on file    Emotionally abused: Not on file    Physically abused: Not on file    Forced sexual activity: Not on file  Other Topics Concern  . Not on file  Social History Narrative   Ms. Sensabaugh was born and reared in [place]. She attended [employer] and graduated in  1971 with her [place] in Education. 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 She then worked for a [employer] in [place]. She is single. She is currently retired. She loves reading. She loves to spend time with her dog.       Caffeine use: 2 cups coffee per day   2 glasses iced tea/ day   Right-handed    FAMILY HISTORY: Family History  Problem Relation Age of Onset  . Depression Mother   . Pancreatic cancer Mother   . Colon cancer Father   . Breast cancer Sister 52  . Diabetes Brother   . Breast cancer Cousin   . Neuropathy Neg Hx     ALLERGIES:  is allergic to latex.  MEDICATIONS:  Current Outpatient Medications  Medication Sig Dispense Refill  . albuterol (PROVENTIL HFA;VENTOLIN HFA) 108 (90 Base) MCG/ACT inhaler Inhale 1-2 puffs into the lungs every 4 (four) hours as needed for wheezing or shortness  of breath. 1 Inhaler 10  . budesonide-formoterol (SYMBICORT) 160-4.5 MCG/ACT inhaler INHALE 2 PUFFS TWO (2) TIMES A DAY. 3 Inhaler 1  . cyanocobalamin (,VITAMIN B-12,) 1000 MCG/ML injection 1000 mcg (1 mg) injection once per week for four weeks, followed by 1000 mcg injection once per month. 1 mL 15  . cyclobenzaprine (FLEXERIL) 5 MG tablet Take 1 tablet (5 mg total) by mouth at bedtime. 30 tablet 1  . ELIQUIS 5 MG TABS tablet Take 1 tablet (5 mg total) by mouth 2 (two) times daily. 180 tablet 3  . esomeprazole (NEXIUM) 40 MG capsule TAKE ONE CAPSULE BY MOUTH DAILY AT 12 NOON 30 capsule 0  . ferrous sulfate 325 (65 FE) MG EC tablet Take 1 tablet (325 mg total) by mouth 2 (two) times daily with a meal. 60 tablet 3  . furosemide (LASIX) 20 MG tablet TAKE 1 TABLET BY MOUTH EVERY DAY AS NEEDED 90 tablet 2  . gabapentin (NEURONTIN) 300 MG capsule Take 1 capsule (300 mg total) by mouth 3 (three) times daily. 60 capsule 3  . HUMIRA PEN 40 MG/0.4ML PNKT     . hydroxychloroquine (PLAQUENIL) 200 MG tablet Take 200 mg by mouth.      . lidocaine (XYLOCAINE) 5 % ointment Apply 1 application topically as needed.    . predniSONE (DELTASONE) 10 MG tablet TAKE 2 TABS DAILY X 3 DAYS, THEN 1 AND 1/2 TAB DAILY X 3 DAYS, THEN 1 TAB DAILY X 3 DAYS, THEN STOP  1  . propranolol ER (INDERAL LA) 80 MG 24 hr capsule Take 1 capsule (80 mg total) by mouth daily. 90 capsule 3  . ranitidine (ZANTAC) 150 MG tablet Take 1 tablet (150 mg total) by mouth 2 (two) times daily. 60 tablet 1  . sertraline (ZOLOFT) 25 MG tablet Take 1 tablet (25 mg total) by mouth daily. 90 tablet 1  . SPIRIVA RESPIMAT 2.5 MCG/ACT AERS 2 PUFFS BY MOUTH DAILY. 3 Inhaler 1  . Syringe/Needle, Disp, (SYRINGE 3CC/25GX5/8") 25G X 5/8" 3 ML MISC Use as directed 5 each 0  . triamcinolone (KENALOG) 0.025 % cream Apply 1 application topically 2 (two) times daily. 15 g 0  . loratadine (CLARITIN) 10 MG tablet Take 1 tablet (10 mg total) by mouth daily. (Patient not taking: Reported on 09/07/2018) 30 tablet 11   No current facility-administered medications for this visit.      PHYSICAL EXAMINATION: ECOG PERFORMANCE STATUS: 1 - Symptomatic but completely ambulatory Vitals:   09/07/18 1023  BP: 133/88  Pulse: 100  Resp: 18  Temp: 98.3 F (36.8 C)   Filed Weights   09/07/18 1023  Weight: 223 lb 8 oz (101.4 kg)    Physical Exam  Constitutional: She is oriented to person, place, and time. She appears well-developed and well-nourished. No distress.  HENT:  Head: Normocephalic and atraumatic.  Right Ear: External ear normal.  Left Ear: External ear normal.  Mouth/Throat: Oropharynx is clear and moist.  Eyes: Pupils are equal, round, and reactive to light. Conjunctivae and EOM are normal. No scleral icterus.  Neck: Normal range of motion. Neck supple.  Cardiovascular: Normal rate, regular rhythm and normal heart sounds.  Pulmonary/Chest: Effort normal and breath sounds normal. No respiratory distress. She has no wheezes. She has no rales. She exhibits no tenderness.   Abdominal: Soft. Bowel sounds are normal. She exhibits no distension and no mass. There is no tenderness.  Musculoskeletal: Normal range of motion. She exhibits no edema or deformity.  Lymphadenopathy:  She has no cervical adenopathy.  Neurological: She is alert and oriented to person, place, and time. No cranial nerve deficit. Coordination normal.  Skin: Skin is warm and dry. No rash noted. No erythema.  Psychiatric: She has a normal mood and affect. Her behavior is normal. Thought content normal.     LABORATORY DATA:  I have reviewed the data as listed Lab Results  Component Value Date   WBC 6.6 09/04/2018   HGB 10.3 (L) 09/04/2018   HCT 32.3 (L) 09/04/2018   MCV 83.1 09/04/2018   PLT 213 09/04/2018   Recent Labs    06/02/18 1052  NA 135  K 3.8  CL 102  CO2 23  GLUCOSE 89  BUN 16  CREATININE 0.97  CALCIUM 9.5  GFRNONAA 58*  GFRAA >60  PROT 7.6  ALBUMIN 4.0  AST 37  ALT 15  ALKPHOS 92  BILITOT 0.5       ASSESSMENT & PLAN:  1. Anemia, unspecified type   2. B12 deficiency   3. Abnormal iron saturation    #Iron deficiency Anemia:  Labs reviewed.  Ferritin 38, saturation ratio 52.  Hemoglobin stable at 10.3.  Normal platelet and WBC cells. Discussed with patient that her iron saturation has been fluctuating.  I will repeat iron testing in 2 weeks.  Clinically she has iron deficiency however her iron saturation ratio intermittently fluctuates. For now she can continue take oral iron supplementation.  Would consider IV iron if the repeat iron testing make more sense.  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 Need life long parental B12 injections.  Continue parenteral B12 injection through primary care physician's office.  # History of small bowel AVMs.  Patient has establish care with Dr. Vira Agar.  As there is no signs of overt GI bleeding, procedures are not indicated at this time as it will not change management.  Dr. Vira Agar recommend  continue monitoring her iron store and continue with IV or oral iron supplementation.  All questions were answered. The patient knows to call the clinic with any problems questions or concerns.  Orders Placed This Encounter  Procedures  . Ferritin    Standing Status:   Future    Standing Expiration Date:   09/08/2019  . Iron and TIBC    Standing Status:   Future    Standing Expiration Date:   09/08/2019  . CBC with Differential/Platelet    Standing Status:   Future    Standing Expiration Date:   09/08/2019  . Iron and TIBC    Standing Status:   Future    Standing Expiration Date:   09/08/2019  . Ferritin    Standing Status:   Future    Standing Expiration Date:   09/08/2019    Return of visit:  2 months and repeat labs.  Total face to face encounter time for this patient visit was 36min. >50% of the time was  spent in counseling and coordination of care.  Earlie Server, MD, PhD Hematology Oncology [facility] Pager- [phone] 09/07/2018

## 2018-09-08 ENCOUNTER — Encounter: Payer: Self-pay | Admitting: Oncology

## 2018-09-08 NOTE — Telephone Encounter (Signed)
Ok Pt was called and msg left to call office. Thank you!

## 2018-09-21 ENCOUNTER — Other Ambulatory Visit: Payer: Self-pay

## 2018-09-21 DIAGNOSIS — R21 Rash and other nonspecific skin eruption: Secondary | ICD-10-CM

## 2018-09-21 MED ORDER — RANITIDINE HCL 150 MG PO TABS: 150.0000 mg | ORAL_TABLET | Freq: Two times a day (BID) | ORAL | 4 refills | Status: DC

## 2018-09-22 ENCOUNTER — Inpatient Hospital Stay: Payer: Medicare Other

## 2018-09-22 DIAGNOSIS — Z87891 Personal history of nicotine dependence: Secondary | ICD-10-CM | POA: Diagnosis not present

## 2018-09-22 DIAGNOSIS — Z86010 Personal history of colonic polyps: Secondary | ICD-10-CM | POA: Diagnosis not present

## 2018-09-22 DIAGNOSIS — K5520 Angiodysplasia of colon without hemorrhage: Secondary | ICD-10-CM | POA: Diagnosis not present

## 2018-09-22 DIAGNOSIS — E538 Deficiency of other specified B group vitamins: Secondary | ICD-10-CM | POA: Diagnosis not present

## 2018-09-22 DIAGNOSIS — D509 Iron deficiency anemia, unspecified: Secondary | ICD-10-CM | POA: Diagnosis not present

## 2018-09-22 DIAGNOSIS — I482 Chronic atrial fibrillation: Secondary | ICD-10-CM | POA: Diagnosis not present

## 2018-09-22 DIAGNOSIS — M069 Rheumatoid arthritis, unspecified: Secondary | ICD-10-CM | POA: Diagnosis not present

## 2018-09-22 DIAGNOSIS — Z79899 Other long term (current) drug therapy: Secondary | ICD-10-CM | POA: Diagnosis not present

## 2018-09-22 DIAGNOSIS — Z7901 Long term (current) use of anticoagulants: Secondary | ICD-10-CM | POA: Diagnosis not present

## 2018-09-22 DIAGNOSIS — D649 Anemia, unspecified: Secondary | ICD-10-CM

## 2018-09-22 LAB — IRON AND TIBC
Iron: 50 ug/dL (ref 28–170)
SATURATION RATIOS: 14 % (ref 10.4–31.8)
TIBC: 349 ug/dL (ref 250–450)
UIBC: 299 ug/dL

## 2018-09-22 LAB — FERRITIN: Ferritin: 24 ng/mL (ref 11–307)

## 2018-09-23 DIAGNOSIS — M0579 Rheumatoid arthritis with rheumatoid factor of multiple sites without organ or systems involvement: Secondary | ICD-10-CM | POA: Diagnosis not present

## 2018-09-23 DIAGNOSIS — M1711 Unilateral primary osteoarthritis, right knee: Secondary | ICD-10-CM | POA: Diagnosis not present

## 2018-09-23 DIAGNOSIS — E66812 Obesity, class 2: Secondary | ICD-10-CM | POA: Insufficient documentation

## 2018-09-23 DIAGNOSIS — E669 Obesity, unspecified: Secondary | ICD-10-CM | POA: Insufficient documentation

## 2018-09-23 DIAGNOSIS — Z79899 Other long term (current) drug therapy: Secondary | ICD-10-CM | POA: Diagnosis not present

## 2018-09-23 DIAGNOSIS — Z13820 Encounter for screening for osteoporosis: Secondary | ICD-10-CM | POA: Diagnosis not present

## 2018-09-30 ENCOUNTER — Ambulatory Visit (INDEPENDENT_AMBULATORY_CARE_PROVIDER_SITE_OTHER): Payer: Medicare Other | Admitting: Family

## 2018-09-30 VITALS — BP 130/80 | HR 56 | Temp 98.4°F | Resp 18 | Wt 221.6 lb

## 2018-09-30 DIAGNOSIS — Z23 Encounter for immunization: Secondary | ICD-10-CM | POA: Diagnosis not present

## 2018-09-30 DIAGNOSIS — F339 Major depressive disorder, recurrent, unspecified: Secondary | ICD-10-CM

## 2018-09-30 DIAGNOSIS — D649 Anemia, unspecified: Secondary | ICD-10-CM

## 2018-09-30 DIAGNOSIS — E1169 Type 2 diabetes mellitus with other specified complication: Secondary | ICD-10-CM | POA: Diagnosis not present

## 2018-09-30 DIAGNOSIS — J438 Other emphysema: Secondary | ICD-10-CM

## 2018-09-30 DIAGNOSIS — I480 Paroxysmal atrial fibrillation: Secondary | ICD-10-CM

## 2018-09-30 NOTE — Progress Notes (Signed)
Subjective:    Patient ID: Brandy Armstrong, female    DOB: [birth year], 72 y.o.   MRN: [id number]  CC: Brandy Armstrong is a 72 y.o. female who presents today for follow up.   HPI: Depression- feeling better on zoloft. No hi/si. Loves her dog 'with a passion'. She doesn't feels no purpose. Had close friends and have lost touch with a best friend a year ago and has been unable to rekindle. Has a good female friend whom she loves. Has good neighbors as well .    Anemia- has seen gi.   No exercise. Member of gym, [employer]. However doesn't go often.   COPD - compliant with medication. Chronic SOB, at baseline. Mopped floor today without feeling SOB which made her feel good.   Atrial fib- on eliquis. No palpitations, CP.   H/o preDM    RA- on humira, Dr Meda Coffee. Joints feel less stiff. On prednisone taper currently.  GI- 08/2018- reviewed note.  HISTORY:  Past Medical History:  Diagnosis Date  . Atrial fibrillation [facility])    a. s/p ablation 01/2012 in [facility] by Dr. Boyd Kerbs;  b. On sotalol & Xarelto;  c. 02/2014 Echo: EF 50-55%, mild conc LVH, nl LA size/structure;  d. Recurrent afib 8/15 & 08/29/2014.  . Barretts esophagus   . Chest pain    a. 02/2014 Myoview: Ef 50%, no ischemia.  . Colon polyps   . COPD (chronic obstructive pulmonary disease) ([place])   . COPD (chronic obstructive pulmonary disease) ([place])   . Depression with anxiety   . GERD (gastroesophageal reflux disease)   . Neuropathy   . Rectal fistula   . Rheumatoid arthritis ([place])    On methotrexate and orencia  . Urinary incontinence   . Vitamin D deficiency    Past Surgical History:  Procedure Laterality Date  . ABDOMINAL HYSTERECTOMY  1992  . CARDIAC ELECTROPHYSIOLOGY STUDY AND ABLATION  2013  . CHOLECYSTECTOMY  1987  . COLONOSCOPY N/A 05/08/2015   Procedure: COLONOSCOPY;  Surgeon: Manya Silvas, MD;  Location: [facility] ENDOSCOPY;  Service: Endoscopy;  Laterality: N/A;  . ESOPHAGOGASTRODUODENOSCOPY N/A 05/06/2015   Procedure:  ESOPHAGOGASTRODUODENOSCOPY (EGD);  Surgeon: Lollie Sails, MD;  Location: [facility] ENDOSCOPY;  Service: Endoscopy;  Laterality: N/A;  . OOPHORECTOMY    . oophrectomy Bilateral 1992   Family History  Problem Relation Age of Onset  . Depression Mother   . Pancreatic cancer Mother   . Colon cancer Father   . Breast cancer Sister 58  . Diabetes Brother   . Breast cancer Cousin   . Neuropathy Neg Hx     Allergies: Latex Current Outpatient Medications on File Prior to Visit  Medication Sig Dispense Refill  . methylPREDNISolone (MEDROL DOSEPAK) 4 MG TBPK tablet Follow package directions.    [name] albuterol (PROVENTIL HFA;VENTOLIN HFA) 108 (90 Base) MCG/ACT inhaler Inhale 1-2 puffs into the lungs every 4 (four) hours as needed for wheezing or shortness of breath. 1 Inhaler 10  . budesonide-formoterol (SYMBICORT) 160-4.5 MCG/ACT inhaler INHALE 2 PUFFS TWO (2) TIMES A DAY. 3 Inhaler 1  . cyanocobalamin (,VITAMIN B-12,) 1000 MCG/ML injection 1000 mcg (1 mg) injection once per week for four weeks, followed by 1000 mcg injection once per month. 1 mL 15  . cyclobenzaprine (FLEXERIL) 5 MG tablet Take 1 tablet (5 mg total) by mouth at bedtime. 30 tablet 1  . ELIQUIS 5 MG TABS tablet Take 1 tablet (5 mg total) by mouth 2 (two) times daily.  180 tablet 3  . esomeprazole (NEXIUM) 40 MG capsule TAKE ONE CAPSULE BY MOUTH DAILY AT 12 NOON 30 capsule 0  . ferrous sulfate 325 (65 FE) MG EC tablet Take 1 tablet (325 mg total) by mouth 2 (two) times daily with a meal. 60 tablet 3  . furosemide (LASIX) 20 MG tablet TAKE 1 TABLET BY MOUTH EVERY DAY AS NEEDED 90 tablet 2  . gabapentin (NEURONTIN) 300 MG capsule Take 1 capsule (300 mg total) by mouth 3 (three) times daily. 60 capsule 3  . HUMIRA PEN 40 MG/0.4ML PNKT     . hydroxychloroquine (PLAQUENIL) 200 MG tablet Take 200 mg by mouth.    [name] ibuprofen (ADVIL,MOTRIN) 400 MG tablet     . lidocaine (XYLOCAINE) 5 % ointment Apply 1 application topically as needed.    .  loratadine (CLARITIN) 10 MG tablet Take 1 tablet (10 mg total) by mouth daily. (Patient not taking: Reported on 09/07/2018) 30 tablet 11  . predniSONE (DELTASONE) 10 MG tablet TAKE 2 TABS DAILY X 3 DAYS, THEN 1 AND 1/2 TAB DAILY X 3 DAYS, THEN 1 TAB DAILY X 3 DAYS, THEN STOP  1  . propranolol ER (INDERAL LA) 80 MG 24 hr capsule Take 1 capsule (80 mg total) by mouth daily. 90 capsule 3  . ranitidine (ZANTAC) 150 MG tablet Take 1 tablet (150 mg total) by mouth 2 (two) times daily. 60 tablet 4  . sertraline (ZOLOFT) 25 MG tablet Take 1 tablet (25 mg total) by mouth daily. 90 tablet 1  . SPIRIVA RESPIMAT 2.5 MCG/ACT AERS 2 PUFFS BY MOUTH DAILY. 3 Inhaler 1  . Syringe/Needle, Disp, (SYRINGE 3CC/25GX5/8") 25G X 5/8" 3 ML MISC Use as directed 5 each 0  . triamcinolone (KENALOG) 0.025 % cream Apply 1 application topically 2 (two) times daily. 15 g 0   No current facility-administered medications on file prior to visit.     Social History   Tobacco Use  . Smoking status: Former Smoker    Packs/day: 1.00    Years: 40.00    Pack years: 40.00    Last attempt to quit: 12/16/2011    Years since quitting: 6.8  . Smokeless tobacco: Never Used  Substance Use Topics  . Alcohol use: Not Currently    Alcohol/week: 0.0 standard drinks    Comment: Occasionally has a drink  . Drug use: No    Review of Systems  Constitutional: Negative for chills and fever.  Respiratory: Negative for cough.   Cardiovascular: Negative for chest pain and palpitations.  Gastrointestinal: Negative for nausea and vomiting.  Musculoskeletal: Positive for arthralgias (chronic, RA).  Psychiatric/Behavioral: Negative for sleep disturbance. The patient is not nervous/anxious.       Objective:    BP 130/80 (BP Location: Left Arm, Patient Position: Sitting, Cuff Size: Normal)   Pulse (!) 56   Temp 98.4 F (36.9 C) (Oral)   Resp 18   Wt 221 lb 9.6 oz (100.5 kg)   SpO2 94%   BMI 35.77 kg/m  BP Readings from Last 3  Encounters:  09/30/18 130/80  09/07/18 133/88  08/24/18 118/90   Wt Readings from Last 3 Encounters:  09/30/18 221 lb 9.6 oz (100.5 kg)  09/07/18 223 lb 8 oz (101.4 kg)  08/24/18 223 lb 2 oz (101.2 kg)    Physical Exam  Constitutional: She appears well-developed and well-nourished.  Eyes: Conjunctivae are normal.  Cardiovascular: Normal rate, regular rhythm, normal heart sounds and normal pulses.  Pulmonary/Chest: Effort normal  and breath sounds normal. She has no wheezes. She has no rhonchi. She has no rales.  Neurological: She is alert.  Skin: Skin is warm and dry.  Psychiatric: She has a normal mood and affect. Her speech is normal and behavior is normal. Thought content normal.  Vitals reviewed.      Assessment & Plan:   Problem List Items Addressed This Visit      Cardiovascular and Mediastinum   Atrial fibrillation ([place])    Clinically stable. Will follow        Respiratory   COPD (chronic obstructive pulmonary disease) ([place])    Clinically stable today. Will follow.       Relevant Medications   methylPREDNISolone (MEDROL DOSEPAK) 4 MG TBPK tablet     Endocrine   Type II diabetes mellitus ([place])    H/o preDM. Pending  labs      Relevant Orders   CBC with Differential/Platelet   Comprehensive metabolic panel   Hemoglobin A1c   Lipid panel   VITAMIN D 25 Hydroxy (Vit-D Deficiency, Fractures)     Other   Anemia    Has seen [facility], reviewed note of conservative management and continued vigilance of hemoglobin, iron, ferritin. Will follow labs.       Depression, recurrent ([place]) - Primary    Some improvement. Offered counseling, increasing of medication( awaiting response as sent via mychart). Strongly encouraged getting out of the house and exercise. Will follow       Other Visit Diagnoses    Need for vaccination with 13-polyvalent pneumococcal conjugate vaccine       Relevant Orders   Pneumococcal conjugate vaccine 13-valent (Completed)       I  am having Clarise Cruz C. Whyte "Alda Gaultney" maintain her hydroxychloroquine, lidocaine, albuterol, esomeprazole, ELIQUIS, propranolol ER, cyclobenzaprine, gabapentin, cyanocobalamin, SYRINGE 3CC/25GX5/8", budesonide-formoterol, SPIRIVA RESPIMAT, ferrous sulfate, loratadine, triamcinolone, HUMIRA PEN, sertraline, furosemide, predniSONE, ranitidine, methylPREDNISolone, and ibuprofen.   No orders of the defined types were placed in this encounter.   Return precautions given.   Risks, benefits, and alternatives of the medications and treatment plan prescribed today were discussed, and patient expressed understanding.   Education regarding symptom management and diagnosis given to patient on AVS.  Continue to follow with [name], FNP for routine health maintenance.   [name] and I agreed with plan.   Mable Paris, FNP

## 2018-09-30 NOTE — Patient Instructions (Addendum)
I am not sure you can have the new shingles vaccine, Shingrex, please discuss with Dr Meda Coffee at follow up  Please start volunteering or going to gym once to twice per week  It is important to get out of the house.  Let me know how I can support you  Please make fasting lab appointment when you can.

## 2018-10-02 ENCOUNTER — Encounter: Payer: Self-pay | Admitting: Family

## 2018-10-02 NOTE — Assessment & Plan Note (Signed)
H/o preDM. Pending  labs

## 2018-10-02 NOTE — Assessment & Plan Note (Signed)
Clinically stable. Will follow

## 2018-10-02 NOTE — Assessment & Plan Note (Signed)
Has seen [facility], reviewed note of conservative management and continued vigilance of hemoglobin, iron, ferritin. Will follow labs.

## 2018-10-02 NOTE — Assessment & Plan Note (Signed)
Some improvement. Offered counseling, increasing of medication( awaiting response as sent via mychart). Strongly encouraged getting out of the house and exercise. Will follow

## 2018-10-02 NOTE — Assessment & Plan Note (Signed)
Clinically stable today. Will follow.

## 2018-10-05 ENCOUNTER — Other Ambulatory Visit: Payer: Self-pay | Admitting: Family

## 2018-10-12 ENCOUNTER — Other Ambulatory Visit (INDEPENDENT_AMBULATORY_CARE_PROVIDER_SITE_OTHER): Payer: Medicare Other

## 2018-10-12 DIAGNOSIS — E1169 Type 2 diabetes mellitus with other specified complication: Secondary | ICD-10-CM | POA: Diagnosis not present

## 2018-10-12 LAB — CBC WITH DIFFERENTIAL/PLATELET
BASOS ABS: 0.1 10*3/uL (ref 0.0–0.1)
Basophils Relative: 1.1 % (ref 0.0–3.0)
EOS ABS: 0.1 10*3/uL (ref 0.0–0.7)
Eosinophils Relative: 1.8 % (ref 0.0–5.0)
HCT: 37.8 % (ref 36.0–46.0)
HEMOGLOBIN: 11.6 g/dL — AB (ref 12.0–15.0)
LYMPHS ABS: 1.5 10*3/uL (ref 0.7–4.0)
Lymphocytes Relative: 26.5 % (ref 12.0–46.0)
MCHC: 30.8 g/dL (ref 30.0–36.0)
MCV: 82.4 fl (ref 78.0–100.0)
MONO ABS: 0.5 10*3/uL (ref 0.1–1.0)
Monocytes Relative: 9 % (ref 3.0–12.0)
NEUTROS PCT: 61.6 % (ref 43.0–77.0)
Neutro Abs: 3.4 10*3/uL (ref 1.4–7.7)
Platelets: 239 10*3/uL (ref 150.0–400.0)
RBC: 4.59 Mil/uL (ref 3.87–5.11)
RDW: 18.4 % — ABNORMAL HIGH (ref 11.5–15.5)
WBC: 5.6 10*3/uL (ref 4.0–10.5)

## 2018-10-12 LAB — COMPREHENSIVE METABOLIC PANEL
ALK PHOS: 72 U/L (ref 39–117)
ALT: 13 U/L (ref 0–35)
AST: 17 U/L (ref 0–37)
Albumin: 3.6 g/dL (ref 3.5–5.2)
BILIRUBIN TOTAL: 0.5 mg/dL (ref 0.2–1.2)
BUN: 12 mg/dL (ref 6–23)
CO2: 32 mEq/L (ref 19–32)
Calcium: 9.2 mg/dL (ref 8.4–10.5)
Chloride: 103 mEq/L (ref 96–112)
Creatinine, Ser: 0.87 mg/dL (ref 0.40–1.20)
GFR: 68.38 mL/min (ref >60.00)
GLUCOSE: 99 mg/dL (ref 70–99)
Potassium: 4.2 mEq/L (ref 3.5–5.1)
Sodium: 139 mEq/L (ref 135–145)
TOTAL PROTEIN: 6.6 g/dL (ref 6.0–8.3)

## 2018-10-12 LAB — LIPID PANEL
CHOLESTEROL: 155 mg/dL (ref 0–200)
HDL: 38.9 mg/dL — ABNORMAL LOW (ref >39.00)
LDL CALC: 80 mg/dL (ref 0–99)
NonHDL: 115.93
TRIGLYCERIDES: 179 mg/dL — AB (ref 0.0–149.0)
Total CHOL/HDL Ratio: 4
VLDL: 35.8 mg/dL (ref 0.0–40.0)

## 2018-10-12 LAB — HEMOGLOBIN A1C: HEMOGLOBIN A1C: 5.5 % (ref 4.6–6.5)

## 2018-10-12 LAB — VITAMIN D 25 HYDROXY (VIT D DEFICIENCY, FRACTURES): VITD: 34.17 ng/mL (ref 30.00–100.00)

## 2018-10-14 ENCOUNTER — Encounter: Payer: Self-pay | Admitting: Family

## 2018-10-16 ENCOUNTER — Other Ambulatory Visit: Payer: Self-pay | Admitting: Family

## 2018-10-16 DIAGNOSIS — K22719 Barrett's esophagus with dysplasia, unspecified: Secondary | ICD-10-CM

## 2018-10-16 MED ORDER — ESOMEPRAZOLE MAGNESIUM 40 MG PO CPDR: DELAYED_RELEASE_CAPSULE | ORAL | 2 refills | Status: DC

## 2018-10-20 ENCOUNTER — Encounter: Payer: Self-pay | Admitting: Family

## 2018-10-20 NOTE — Telephone Encounter (Signed)
Patient is calling in regards to this and would like to be referred to Gurney Maxin.

## 2018-10-21 ENCOUNTER — Other Ambulatory Visit: Payer: Self-pay | Admitting: Family

## 2018-10-21 DIAGNOSIS — G629 Polyneuropathy, unspecified: Secondary | ICD-10-CM

## 2018-10-23 ENCOUNTER — Other Ambulatory Visit: Payer: Self-pay | Admitting: Neurology

## 2018-10-23 DIAGNOSIS — G250 Essential tremor: Secondary | ICD-10-CM

## 2018-10-26 ENCOUNTER — Other Ambulatory Visit: Payer: Self-pay | Admitting: Family

## 2018-10-26 MED ORDER — GABAPENTIN 100 MG PO CAPS: 100.0000 mg | ORAL_CAPSULE | Freq: Three times a day (TID) | ORAL | 0 refills | Status: DC

## 2018-11-02 ENCOUNTER — Inpatient Hospital Stay: Payer: Medicare Other | Attending: Oncology

## 2018-11-02 DIAGNOSIS — D649 Anemia, unspecified: Secondary | ICD-10-CM

## 2018-11-02 DIAGNOSIS — E538 Deficiency of other specified B group vitamins: Secondary | ICD-10-CM | POA: Insufficient documentation

## 2018-11-02 DIAGNOSIS — I4891 Unspecified atrial fibrillation: Secondary | ICD-10-CM | POA: Insufficient documentation

## 2018-11-02 DIAGNOSIS — Z7901 Long term (current) use of anticoagulants: Secondary | ICD-10-CM | POA: Diagnosis not present

## 2018-11-02 DIAGNOSIS — K5521 Angiodysplasia of colon with hemorrhage: Secondary | ICD-10-CM | POA: Diagnosis not present

## 2018-11-02 DIAGNOSIS — D508 Other iron deficiency anemias: Secondary | ICD-10-CM | POA: Diagnosis not present

## 2018-11-02 DIAGNOSIS — Z87891 Personal history of nicotine dependence: Secondary | ICD-10-CM | POA: Diagnosis not present

## 2018-11-02 LAB — CBC WITH DIFFERENTIAL/PLATELET
Abs Immature Granulocytes: 0.02 10*3/uL (ref 0.00–0.07)
Basophils Absolute: 0 10*3/uL (ref 0.0–0.1)
Basophils Relative: 0 %
EOS PCT: 0 %
Eosinophils Absolute: 0 10*3/uL (ref 0.0–0.5)
HEMATOCRIT: 36.3 % (ref 36.0–46.0)
Hemoglobin: 10.9 g/dL — ABNORMAL LOW (ref 12.0–15.0)
Immature Granulocytes: 0 %
LYMPHS ABS: 1 10*3/uL (ref 0.7–4.0)
LYMPHS PCT: 12 %
MCH: 24.9 pg — ABNORMAL LOW (ref 26.0–34.0)
MCHC: 30 g/dL (ref 30.0–36.0)
MCV: 83.1 fL (ref 80.0–100.0)
MONO ABS: 0.6 10*3/uL (ref 0.1–1.0)
Monocytes Relative: 6 %
Neutro Abs: 7.3 10*3/uL (ref 1.7–7.7)
Neutrophils Relative %: 82 %
Platelets: 229 10*3/uL (ref 150–400)
RBC: 4.37 MIL/uL (ref 3.87–5.11)
RDW: 15.3 % (ref 11.5–15.5)
WBC: 9 10*3/uL (ref 4.0–10.5)
nRBC: 0 % (ref 0.0–0.2)

## 2018-11-02 LAB — IRON AND TIBC
IRON: 41 ug/dL (ref 28–170)
Saturation Ratios: 10 % — ABNORMAL LOW (ref 10.4–31.8)
TIBC: 400 ug/dL (ref 250–450)
UIBC: 359 ug/dL

## 2018-11-02 LAB — FERRITIN: Ferritin: 10 ng/mL — ABNORMAL LOW (ref 11–307)

## 2018-11-03 ENCOUNTER — Other Ambulatory Visit: Payer: Self-pay | Admitting: Oncology

## 2018-11-03 DIAGNOSIS — D500 Iron deficiency anemia secondary to blood loss (chronic): Secondary | ICD-10-CM

## 2018-11-04 ENCOUNTER — Other Ambulatory Visit: Payer: Self-pay

## 2018-11-04 ENCOUNTER — Inpatient Hospital Stay (HOSPITAL_BASED_OUTPATIENT_CLINIC_OR_DEPARTMENT_OTHER): Payer: Medicare Other | Admitting: Oncology

## 2018-11-04 ENCOUNTER — Encounter: Payer: Self-pay | Admitting: Oncology

## 2018-11-04 ENCOUNTER — Inpatient Hospital Stay: Payer: Medicare Other

## 2018-11-04 VITALS — BP 131/90 | HR 88 | Temp 97.1°F | Resp 20

## 2018-11-04 VITALS — BP 137/96 | HR 105 | Temp 96.5°F | Wt 222.5 lb

## 2018-11-04 DIAGNOSIS — D500 Iron deficiency anemia secondary to blood loss (chronic): Secondary | ICD-10-CM

## 2018-11-04 DIAGNOSIS — Z87891 Personal history of nicotine dependence: Secondary | ICD-10-CM

## 2018-11-04 DIAGNOSIS — I4891 Unspecified atrial fibrillation: Secondary | ICD-10-CM | POA: Diagnosis not present

## 2018-11-04 DIAGNOSIS — K5521 Angiodysplasia of colon with hemorrhage: Secondary | ICD-10-CM

## 2018-11-04 DIAGNOSIS — E538 Deficiency of other specified B group vitamins: Secondary | ICD-10-CM

## 2018-11-04 DIAGNOSIS — D508 Other iron deficiency anemias: Secondary | ICD-10-CM

## 2018-11-04 DIAGNOSIS — Z7901 Long term (current) use of anticoagulants: Secondary | ICD-10-CM | POA: Diagnosis not present

## 2018-11-04 MED ORDER — SODIUM CHLORIDE 0.9 % IV SOLN
Freq: Once | INTRAVENOUS | Status: AC
  Administered 2018-11-04: 12:00:00 via INTRAVENOUS
  Filled 2018-11-04: qty 250

## 2018-11-04 MED ORDER — SODIUM CHLORIDE 0.9 % IV SOLN: 200.0000 mg | Freq: Once | INTRAVENOUS | Status: DC

## 2018-11-04 MED ORDER — IRON SUCROSE 20 MG/ML IV SOLN
200.0000 mg | Freq: Once | INTRAVENOUS | Status: AC
  Administered 2018-11-04: 200 mg via INTRAVENOUS
  Filled 2018-11-04: qty 10

## 2018-11-05 ENCOUNTER — Other Ambulatory Visit: Payer: Self-pay | Admitting: Oncology

## 2018-11-05 MED ORDER — CYANOCOBALAMIN 500 MCG/0.1ML NA SOLN: 1000.0000 ug | Freq: Every day | NASAL | 3 refills | Status: DC

## 2018-11-05 NOTE — Telephone Encounter (Signed)
Can you see what alternative are covered. Thanks

## 2018-11-05 NOTE — Progress Notes (Signed)
Hematology/Oncology Consult note [facility] [phone] [phone]   Patient Care Team: [name], FNP as PCP - General (Family Medicine)  REFERRING PROVIDER: [name], FNP  REASON FOR VISIT Follow up for treatment of anemia and iron deficiency anemia.   HISTORY OF PRESENTING ILLNESS:  Brandy Armstrong is a  72 y.o.  female with PMH listed below who was referred to me for evaluation of anemia and elevated iron level.  Patient recently had lab work done which revealed anemia with hemoglobin count at 10.2 on 05/04/2018.  I reviewed patient's previous labs, Anemia is chronic, dated back to 2014. She recalls having 1 PRBC transfusion in the past and was told she has iron deficiency anemia. She takes oral OTC  iron supplements and anemia has improved.  Associated with fatigue, lack of energy. Denies weight loss, easy bruising, hematochezia, hemoptysis.  Last colonoscopy 2016 revealed polyps which were removed. No malignancy was found.  Last EGD: 2016 which showed gastritis.   Reviewed recent iron panel which showed elevated iron at 346, TSAT elevated at 89, ferritin 26. Denies any family history of hemachromatosis.  Recent B12 level showed decreased B12, she reports that she has been started on parental B12 injections with her PCP.  History RF, she takes plaquenil 200mg  daily.  History of Afib, takes Elqiuis 5mg  BID.   INTERVAL HISTORY Brandy Armstrong is a 72 y.o. female who has above history reviewed by me today presents for follow-up for management of iron deficiency anemia.   Reports feeling tired and fatigue recently. Appetite is good. No other new complaints. Weight is stable.  She takes Eliquis for atrial fibrillation. Reports tolerating, no bleeding problems.     Review of Systems  Constitutional: Positive for malaise/fatigue. Negative for chills, fever and weight loss.  HENT: Negative for congestion, ear discharge, ear  pain, nosebleeds, sinus pain and sore throat.   Eyes: Negative for double vision, photophobia, pain, discharge and redness.  Respiratory: Negative for cough, hemoptysis, sputum production, shortness of breath and wheezing.   Cardiovascular: Negative for chest pain, palpitations, orthopnea, claudication and leg swelling.  Gastrointestinal: Negative for abdominal pain, blood in stool, constipation, diarrhea, heartburn, melena, nausea and vomiting.  Genitourinary: Negative for dysuria, flank pain, frequency and hematuria.  Musculoskeletal: Negative for back pain, myalgias and neck pain.  Skin: Negative for itching and rash.  Neurological: Negative for dizziness, tingling, tremors, focal weakness, weakness and headaches.  Endo/Heme/Allergies: Negative for environmental allergies. Does not bruise/bleed easily.  Psychiatric/Behavioral: Negative for depression and hallucinations. The patient is not nervous/anxious.     MEDICAL HISTORY:  Past Medical History:  Diagnosis Date  . Atrial fibrillation [facility])    a. s/p ablation 01/2012 in [facility] by Dr. Boyd Kerbs;  b. On sotalol & Xarelto;  c. 02/2014 Echo: EF 50-55%, mild conc LVH, nl LA size/structure;  d. Recurrent afib 8/15 & 08/29/2014.  . Barretts esophagus   . Chest pain    a. 02/2014 Myoview: Ef 50%, no ischemia.  . Colon polyps   . COPD (chronic obstructive pulmonary disease) ([place])   . COPD (chronic obstructive pulmonary disease) ([place])   . Depression with anxiety   . GERD (gastroesophageal reflux disease)   . Neuropathy   . Rectal fistula   . Rheumatoid arthritis ([place])    On methotrexate and orencia  . Urinary incontinence   . Vitamin D deficiency     SURGICAL HISTORY: Past Surgical History:  Procedure Laterality Date  .  ABDOMINAL HYSTERECTOMY  1992  . CARDIAC ELECTROPHYSIOLOGY STUDY AND ABLATION  2013  . CHOLECYSTECTOMY  1987  . COLONOSCOPY N/A 05/08/2015   Procedure: COLONOSCOPY;  Surgeon: Manya Silvas, MD;  Location: [facility]  ENDOSCOPY;  Service: Endoscopy;  Laterality: N/A;  . ESOPHAGOGASTRODUODENOSCOPY N/A 05/06/2015   Procedure: ESOPHAGOGASTRODUODENOSCOPY (EGD);  Surgeon: Lollie Sails, MD;  Location: [facility] ENDOSCOPY;  Service: Endoscopy;  Laterality: N/A;  . OOPHORECTOMY    . oophrectomy Bilateral 1992    SOCIAL HISTORY: Social History   Socioeconomic History  . Marital status: Single    Spouse name: Not on file  . Number of children: 0  . Years of education: 72  . Highest education level: Not on file  Occupational History  . Occupation: Retired  Scientific laboratory technician  . Financial resource strain: Not on file  . Food insecurity:    Worry: Not on file    Inability: Not on file  . Transportation needs:    Medical: Not on file    Non-medical: Not on file  Tobacco Use  . Smoking status: Former Smoker    Packs/day: 1.00    Years: 40.00    Pack years: 40.00    Last attempt to quit: 12/16/2011    Years since quitting: 6.8  . Smokeless tobacco: Never Used  Substance and Sexual Activity  . Alcohol use: Not Currently    Alcohol/week: 0.0 standard drinks    Comment: Occasionally has a drink  . Drug use: No  . Sexual activity: Never  Lifestyle  . Physical activity:    Days per week: Not on file    Minutes per session: Not on file  . Stress: Not on file  Relationships  . Social connections:    Talks on phone: Not on file    Gets together: Not on file    Attends religious service: Not on file    Active member of club or organization: Not on file    Attends meetings of clubs or organizations: Not on file    Relationship status: Not on file  . Intimate partner violence:    Fear of current or ex partner: Not on file    Emotionally abused: Not on file    Physically abused: Not on file    Forced sexual activity: Not on file  Other Topics Concern  . Not on file  Social History Narrative   Ms. Lemaster was born and reared in [place]. She attended [employer] and graduated in 1971 with her [place] in  Education. 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 She then worked for a [employer] in [place]. She is single. She is currently retired. She loves reading. She loves to spend time with her dog.       Caffeine use: 2 cups coffee per day   2 glasses iced tea/ day   Right-handed    FAMILY HISTORY: Family History  Problem Relation Age of Onset  . Depression Mother   . Pancreatic cancer Mother   . Colon cancer Father   . Breast cancer Sister 36  . Diabetes Brother   . Breast cancer Cousin   . Neuropathy Neg Hx     ALLERGIES:  is allergic to latex.  MEDICATIONS:  Current Outpatient Medications  Medication Sig Dispense Refill  . albuterol (PROVENTIL HFA;VENTOLIN HFA) 108 (90 Base) MCG/ACT inhaler Inhale 1-2 puffs into the lungs every 4 (four) hours as needed for wheezing or  shortness of breath. 1 Inhaler 10  . budesonide-formoterol (SYMBICORT) 160-4.5 MCG/ACT inhaler INHALE 2 PUFFS TWO (2) TIMES A DAY. 3 Inhaler 1  . cyclobenzaprine (FLEXERIL) 5 MG tablet Take 1 tablet (5 mg total) by mouth at bedtime. 30 tablet 1  . ELIQUIS 5 MG TABS tablet Take 1 tablet (5 mg total) by mouth 2 (two) times daily. 180 tablet 3  . ferrous sulfate 325 (65 FE) MG EC tablet Take 1 tablet (325 mg total) by mouth 2 (two) times daily with a meal. 60 tablet 3  . furosemide (LASIX) 20 MG tablet TAKE 1 TABLET BY MOUTH EVERY DAY AS NEEDED 90 tablet 2  . gabapentin (NEURONTIN) 100 MG capsule Take 1 capsule (100 mg total) by mouth 3 (three) times daily. 90 capsule 0  . HUMIRA PEN 40 MG/0.4ML PNKT     . hydroxychloroquine (PLAQUENIL) 200 MG tablet Take 200 mg by mouth.    [name] ibuprofen (ADVIL,MOTRIN) 400 MG tablet     . lidocaine (XYLOCAINE) 5 % ointment Apply 1 application topically as needed.    . loratadine (CLARITIN) 10 MG tablet Take 1 tablet (10 mg total) by mouth daily. 30 tablet 11  . propranolol ER (INDERAL LA) 80 MG 24 hr capsule Take 1 capsule  (80 mg total) by mouth daily. 90 capsule 3  . ranitidine (ZANTAC) 150 MG tablet Take 1 tablet (150 mg total) by mouth 2 (two) times daily. 60 tablet 4  . SPIRIVA RESPIMAT 2.5 MCG/ACT AERS 2 PUFFS BY MOUTH DAILY. 3 Inhaler 1  . Syringe/Needle, Disp, (SYRINGE 3CC/25GX5/8") 25G X 5/8" 3 ML MISC Use as directed 5 each 0  . triamcinolone (KENALOG) 0.025 % cream Apply 1 application topically 2 (two) times daily. 15 g 0  . Cyanocobalamin 500 MCG/0.1ML SOLN Place 0.2 mLs (1,000 mcg total) into the nose daily. 1 Bottle 3   No current facility-administered medications for this visit.      PHYSICAL EXAMINATION: ECOG PERFORMANCE STATUS: 1 - Symptomatic but completely ambulatory Vitals:   11/04/18 1102  BP: (!) 137/96  Pulse: (!) 105  Temp: (!) 96.5 F (35.8 C)   Filed Weights   11/04/18 1102  Weight: 222 lb 8 oz (100.9 kg)    Physical Exam  Constitutional: She is oriented to person, place, and time. No distress.  HENT:  Head: Normocephalic and atraumatic.  Mouth/Throat: Oropharynx is clear and moist.  Eyes: Pupils are equal, round, and reactive to light. Conjunctivae and EOM are normal. No scleral icterus.  Neck: Normal range of motion. Neck supple.  Cardiovascular: Normal rate, regular rhythm and normal heart sounds.  Pulmonary/Chest: Effort normal and breath sounds normal. No respiratory distress. She has no wheezes. She exhibits no tenderness.  Abdominal: Soft. Bowel sounds are normal. She exhibits no distension and no mass. There is no tenderness.  Musculoskeletal: Normal range of motion. She exhibits no edema or deformity.  Lymphadenopathy:    She has no cervical adenopathy.  Neurological: She is alert and oriented to person, place, and time. No cranial nerve deficit. Coordination normal.  Skin: Skin is warm and dry. No rash noted. No erythema.  Psychiatric: She has a normal mood and affect. Her behavior is normal. Thought content normal.     LABORATORY DATA:  I have reviewed  the data as listed Lab Results  Component Value Date   WBC 9.0 11/02/2018   HGB 10.9 (L) 11/02/2018   HCT 36.3 11/02/2018   MCV 83.1 11/02/2018   PLT 229 11/02/2018  Recent Labs    06/02/18 1052 10/12/18 1040  NA 135 139  K 3.8 4.2  CL 102 103  CO2 23 32  GLUCOSE 89 99  BUN 16 12  CREATININE 0.97 0.87  CALCIUM 9.5 9.2  GFRNONAA 58*  --   GFRAA >60  --   PROT 7.6 6.6  ALBUMIN 4.0 3.6  AST 37 17  ALT 15 13  ALKPHOS 92 72  BILITOT 0.5 0.5       ASSESSMENT & PLAN:  1. Iron deficiency anemia due to chronic blood loss   2. B12 deficiency   3. Chronic anticoagulation    #Iron deficiency Anemia:  Labs reviewed and consistent with severe iron deficiency.  Will proceed with IV Venofer 200mg  weekly x 4.    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 Need life long parental B12 supplements.  She reports not on parental B12 injections any more as she has to pay to get done at home.  Advise trial of nasal B12 spray 1000 mcg daily.   # History of small bowel AVMs.  Recommend patient to follow up with Dr.Eliott to discuss further plan for management of her AVMs.  All questions were answered. The patient knows to call the clinic with any problems questions or concerns.  Orders Placed This Encounter  Procedures  . Vitamin B12    Standing Status:   Future    Standing Expiration Date:   02/04/2019  . Iron and TIBC    Standing Status:   Future    Standing Expiration Date:   11/05/2019  . CBC with Differential/Platelet    Standing Status:   Future    Standing Expiration Date:   11/05/2019  . Ferritin    Standing Status:   Future    Standing Expiration Date:   11/05/2019    Return of visit:  9-10 weeks.     Earlie Server, MD, PhD Hematology Oncology [facility] Pager- [phone] 11/05/2018

## 2018-11-06 NOTE — Telephone Encounter (Signed)
Patient states that she will purchase B12 OTC

## 2018-11-11 ENCOUNTER — Inpatient Hospital Stay: Payer: Medicare Other

## 2018-11-11 VITALS — BP 121/81 | HR 88 | Resp 20

## 2018-11-11 DIAGNOSIS — Z87891 Personal history of nicotine dependence: Secondary | ICD-10-CM | POA: Diagnosis not present

## 2018-11-11 DIAGNOSIS — E538 Deficiency of other specified B group vitamins: Secondary | ICD-10-CM | POA: Diagnosis not present

## 2018-11-11 DIAGNOSIS — D508 Other iron deficiency anemias: Secondary | ICD-10-CM | POA: Diagnosis not present

## 2018-11-11 DIAGNOSIS — Z7901 Long term (current) use of anticoagulants: Secondary | ICD-10-CM | POA: Diagnosis not present

## 2018-11-11 DIAGNOSIS — D500 Iron deficiency anemia secondary to blood loss (chronic): Secondary | ICD-10-CM

## 2018-11-11 DIAGNOSIS — I4891 Unspecified atrial fibrillation: Secondary | ICD-10-CM | POA: Diagnosis not present

## 2018-11-11 DIAGNOSIS — K5521 Angiodysplasia of colon with hemorrhage: Secondary | ICD-10-CM | POA: Diagnosis not present

## 2018-11-11 MED ORDER — IRON SUCROSE 20 MG/ML IV SOLN
200.0000 mg | Freq: Once | INTRAVENOUS | Status: AC
  Administered 2018-11-11: 200 mg via INTRAVENOUS
  Filled 2018-11-11: qty 10

## 2018-11-11 MED ORDER — SODIUM CHLORIDE 0.9 % IV SOLN
Freq: Once | INTRAVENOUS | Status: AC
  Administered 2018-11-11: 15:00:00 via INTRAVENOUS
  Filled 2018-11-11: qty 250

## 2018-11-11 MED ORDER — SODIUM CHLORIDE 0.9 % IV SOLN: 200.0000 mg | Freq: Once | INTRAVENOUS | Status: DC

## 2018-11-17 DIAGNOSIS — Z79899 Other long term (current) drug therapy: Secondary | ICD-10-CM | POA: Diagnosis not present

## 2018-11-17 DIAGNOSIS — M069 Rheumatoid arthritis, unspecified: Secondary | ICD-10-CM | POA: Diagnosis not present

## 2018-11-17 LAB — HM DIABETES EYE EXAM

## 2018-11-18 ENCOUNTER — Other Ambulatory Visit: Payer: Self-pay | Admitting: Pulmonary Disease

## 2018-11-18 ENCOUNTER — Inpatient Hospital Stay: Payer: Medicare Other

## 2018-11-18 VITALS — BP 101/70 | HR 80 | Temp 96.0°F | Resp 18

## 2018-11-18 DIAGNOSIS — Z7901 Long term (current) use of anticoagulants: Secondary | ICD-10-CM | POA: Diagnosis not present

## 2018-11-18 DIAGNOSIS — D500 Iron deficiency anemia secondary to blood loss (chronic): Secondary | ICD-10-CM

## 2018-11-18 DIAGNOSIS — D508 Other iron deficiency anemias: Secondary | ICD-10-CM | POA: Diagnosis not present

## 2018-11-18 DIAGNOSIS — I4891 Unspecified atrial fibrillation: Secondary | ICD-10-CM | POA: Diagnosis not present

## 2018-11-18 DIAGNOSIS — K5521 Angiodysplasia of colon with hemorrhage: Secondary | ICD-10-CM | POA: Diagnosis not present

## 2018-11-18 DIAGNOSIS — Z87891 Personal history of nicotine dependence: Secondary | ICD-10-CM | POA: Diagnosis not present

## 2018-11-18 DIAGNOSIS — E538 Deficiency of other specified B group vitamins: Secondary | ICD-10-CM | POA: Diagnosis not present

## 2018-11-18 MED ORDER — SODIUM CHLORIDE 0.9 % IV SOLN
Freq: Once | INTRAVENOUS | Status: AC
  Administered 2018-11-18: 14:00:00 via INTRAVENOUS
  Filled 2018-11-18: qty 250

## 2018-11-18 MED ORDER — IRON SUCROSE 20 MG/ML IV SOLN
200.0000 mg | Freq: Once | INTRAVENOUS | Status: AC
  Administered 2018-11-18: 200 mg via INTRAVENOUS
  Filled 2018-11-18: qty 10

## 2018-11-25 ENCOUNTER — Inpatient Hospital Stay: Payer: Medicare Other

## 2018-11-25 VITALS — BP 100/60 | HR 82 | Temp 97.8°F | Resp 18

## 2018-11-25 DIAGNOSIS — Z87891 Personal history of nicotine dependence: Secondary | ICD-10-CM | POA: Diagnosis not present

## 2018-11-25 DIAGNOSIS — I4891 Unspecified atrial fibrillation: Secondary | ICD-10-CM | POA: Diagnosis not present

## 2018-11-25 DIAGNOSIS — Z7901 Long term (current) use of anticoagulants: Secondary | ICD-10-CM | POA: Diagnosis not present

## 2018-11-25 DIAGNOSIS — D508 Other iron deficiency anemias: Secondary | ICD-10-CM | POA: Diagnosis not present

## 2018-11-25 DIAGNOSIS — D500 Iron deficiency anemia secondary to blood loss (chronic): Secondary | ICD-10-CM

## 2018-11-25 DIAGNOSIS — K5521 Angiodysplasia of colon with hemorrhage: Secondary | ICD-10-CM | POA: Diagnosis not present

## 2018-11-25 DIAGNOSIS — E538 Deficiency of other specified B group vitamins: Secondary | ICD-10-CM | POA: Diagnosis not present

## 2018-11-25 MED ORDER — SODIUM CHLORIDE 0.9 % IV SOLN
Freq: Once | INTRAVENOUS | Status: AC
  Administered 2018-11-25: 14:00:00 via INTRAVENOUS
  Filled 2018-11-25: qty 250

## 2018-11-25 MED ORDER — IRON SUCROSE 20 MG/ML IV SOLN
200.0000 mg | Freq: Once | INTRAVENOUS | Status: AC
  Administered 2018-11-25: 200 mg via INTRAVENOUS
  Filled 2018-11-25: qty 10

## 2018-12-04 ENCOUNTER — Telehealth: Payer: Self-pay | Admitting: Family

## 2018-12-04 NOTE — Telephone Encounter (Signed)
Copied from [place] [id number]. Topic: Quick Communication - Rx Refill/Question >> Dec 04, 2018  2:42 PM Scherrie Gerlach wrote: Medication:  gabapentin (NEURONTIN) 600 MG tab  Pt states she needs 600 mg TID to control her neuropathy. Pt states Dr Jaynee Eagles filled for her last time in July. Pt states the 100 mg will do her no good.  Pt has appt in Jan 2020 with a neurologist.   Pt does not want to go all the way back to [facility] to see Dr Jaynee Eagles  [id number] - [place], [place] - [phone] (Phone) [phone] (Fax)

## 2018-12-06 NOTE — Telephone Encounter (Signed)
Call patient I am confused here.  At that we both agreed that the gabapentin was causing her fatigue to be worse.  The last MyChart message I had, she was in a taper off this medication. Did she not come off?  Please offer office visit appointment so we can discuss.

## 2018-12-07 NOTE — Telephone Encounter (Signed)
Thank you - but I dont see an appt for her leg swelling?  Will call in  However she is taking gabapentin 600mg  qd or is she taking 600mg  TID?

## 2018-12-07 NOTE — Telephone Encounter (Signed)
I have scheduled appointment Wednesday 12/11.

## 2018-12-07 NOTE — Telephone Encounter (Signed)
Patient states that she recently had four iron infusions which has helped her regain some energy & she feels that she symptoms weren't from the Gabapentin. She stated that she [place] taking the 600 mg a day for her neuropathy & still wants that called in. I have also scheduled her an appointment to discuss pain she is having as well swelling in feet & ankles. Her appointment is Wednesday.

## 2018-12-09 ENCOUNTER — Ambulatory Visit (INDEPENDENT_AMBULATORY_CARE_PROVIDER_SITE_OTHER): Payer: Medicare Other | Admitting: Family

## 2018-12-09 ENCOUNTER — Ambulatory Visit (INDEPENDENT_AMBULATORY_CARE_PROVIDER_SITE_OTHER): Payer: Medicare Other

## 2018-12-09 ENCOUNTER — Encounter: Payer: Self-pay | Admitting: Family

## 2018-12-09 VITALS — BP 132/80 | HR 74 | Temp 98.0°F | Wt 227.1 lb

## 2018-12-09 DIAGNOSIS — F339 Major depressive disorder, recurrent, unspecified: Secondary | ICD-10-CM | POA: Diagnosis not present

## 2018-12-09 DIAGNOSIS — M549 Dorsalgia, unspecified: Secondary | ICD-10-CM

## 2018-12-09 DIAGNOSIS — M545 Low back pain: Secondary | ICD-10-CM | POA: Diagnosis not present

## 2018-12-09 DIAGNOSIS — J441 Chronic obstructive pulmonary disease with (acute) exacerbation: Secondary | ICD-10-CM | POA: Diagnosis not present

## 2018-12-09 DIAGNOSIS — G629 Polyneuropathy, unspecified: Secondary | ICD-10-CM | POA: Diagnosis not present

## 2018-12-09 DIAGNOSIS — G8929 Other chronic pain: Secondary | ICD-10-CM

## 2018-12-09 DIAGNOSIS — I480 Paroxysmal atrial fibrillation: Secondary | ICD-10-CM

## 2018-12-09 DIAGNOSIS — M069 Rheumatoid arthritis, unspecified: Secondary | ICD-10-CM

## 2018-12-09 MED ORDER — BUPROPION HCL ER (XL) 150 MG PO TB24: ORAL_TABLET | ORAL | 3 refills | Status: DC

## 2018-12-09 MED ORDER — DOXYCYCLINE HYCLATE 100 MG PO TABS: 100.0000 mg | ORAL_TABLET | Freq: Two times a day (BID) | ORAL | 0 refills | Status: AC

## 2018-12-09 MED ORDER — GABAPENTIN 600 MG PO TABS: 600.0000 mg | ORAL_TABLET | Freq: Two times a day (BID) | ORAL | 2 refills | Status: DC

## 2018-12-09 NOTE — Patient Instructions (Addendum)
Today we discussed referrals, orders. Sleep study, physical therapy   I have placed these orders in the system for you.  Please be sure to give [place] a call if you have not heard from our office regarding this. We should hear from [place] within ONE week with information regarding your appointment. If not, please let me know immediately.      Trial of wellbutrin.  We must focus on weight loss  This is  Dr. Lupita Dawn  example of a  "Low GI"  Diet:  It will allow you to lose 4 to 8  lbs  per month if you follow it carefully.  Your goal with exercise is a minimum of 30 minutes of aerobic exercise 5 days per week (Walking does not count once it becomes easy!)    All of the foods can be found at grocery stores and in bulk at [employer].  The Atkins protein bars and shakes are available in more varieties at Target, [employer] and [place].     7 AM Breakfast:  Choose from the following:  Low carbohydrate Protein  Shakes (I recommend the  Premier Protein chocolate shakes,  EAS AdvantEdge "Carb Control" shakes  Or the Atkins shakes all are under 3 net carbs)     a scrambled egg/bacon/cheese burrito made with Mission's "carb balance" whole wheat tortilla  (about 10 net carbs )  Regulatory affairs officer (basically a quiche without the pastry crust) that is eaten cold and very convenient way to get your eggs.  8 carbs)  If you make your own protein shakes, avoid bananas and pineapple,  And use low carb greek yogurt or original /unsweetened almond or soy milk    Avoid cereal and bananas, oatmeal and cream of wheat and grits. They are loaded with carbohydrates!   10 AM: high protein snack:  Protein bar by Atkins (the snack size, under 200 cal, usually < 6 net carbs).    A stick of cheese:  Around 1 carb,  100 cal     Dannon Light n Fit [place] Yogurt  (80 cal, 8 carbs)  Other so called "protein bars" and Greek yogurts tend to be loaded with carbohydrates.  Remember, in food advertising, the word  "energy" is synonymous for " carbohydrate."  Lunch:   A Sandwich using the bread choices listed, Can use any  Eggs,  lunchmeat, grilled meat or canned tuna), avocado, regular mayo/mustard  and cheese.  A Salad using blue cheese, ranch,  Goddess or vinagrette,  Avoid taco shells, croutons or "confetti" and no "candied nuts" but regular nuts OK.   No pretzels, nabs  or chips.  Pickles and miniature sweet peppers are a good low carb alternative that provide a "crunch"  The bread is the only source of carbohydrate in a sandwich and  can be decreased by trying some of the attached alternatives to traditional loaf bread   Avoid "Low fat dressings, as well as Rathbun dressings They are loaded with sugar!   3 PM/ Mid day  Snack:  Consider  1 ounce of  almonds, walnuts, pistachios, pecans, peanuts,  Macadamia nuts or a nut medley.  Avoid "granola and granola bars "  Mixed nuts are ok in moderation as long as there are no raisins,  cranberries or dried fruit.   KIND bars are OK if you get the low glycemic index variety   Try the prosciutto/mozzarella cheese sticks by Fiorruci  In deli /backery section  High protein      6 PM  Dinner:     Meat/fowl/fish with a green salad, and either broccoli, cauliflower, green beans, spinach, brussel sprouts or  Lima beans. DO NOT BREAD THE PROTEIN!!      There is a low carb pasta by Dreamfield's that is acceptable and tastes great: only 5 digestible carbs/serving.( All grocery stores but BJs carry it ) Several ready made meals are available low carb:   Try Michel Angelo's chicken piccata or chicken or eggplant parm over low carb pasta.(Lowes and BJs)   Marjory Lies Sanchez's "Carnitas" (pulled pork, no sauce,  0 carbs) or his beef pot roast to make a dinner burrito (at BJ's)  Pesto over low carb pasta (bj's sells a good quality pesto in the center refrigerated section of the deli   Try satueeing  Cheral Marker with mushroooms as a good side   [employer]  makes a mashed cauliflower that tastes like mashed potatoes  Whole wheat pasta is still full of digestible carbs and  Not as low in glycemic index as Dreamfield's.   Brown rice is still rice,  So skip the rice and noodles if you eat [place] or [place] (or at least limit to 1/2 cup)  9 PM snack :   Breyer's "low carb" fudgsicle or  ice cream bar (Carb Smart line), or  Weight Watcher's ice cream bar , or another "no sugar added" ice cream;  a serving of fresh berries/cherries with whipped cream   Cheese or DANNON'S LlGHT N FIT GREEK YOGURT  8 ounces of [place] unsweetened almond/cococunut milk    Treat yourself to a parfait made with whipped cream blueberiies, walnuts and vanilla greek yogurt  Avoid bananas, pineapple, grapes  and watermelon on a regular basis because they are high in sugar.  THINK OF THEM AS DESSERT  Remember that snack Substitutions should be less than 10 NET carbs per serving and meals < 20 carbs. Remember to subtract fiber grams to get the "net carbs."  @TULLOBREADPACKAGE @

## 2018-12-09 NOTE — Assessment & Plan Note (Signed)
Suspect current RA flare.  Pending x-ray of low back.  Based on exam and prior XRs, suspect osteoarthritis playing a role as well.  referral to physical therapy.

## 2018-12-09 NOTE — Assessment & Plan Note (Addendum)
I suspect shortness of breath is multifactorial including weight gain, deconditioning, COPD however in the setting of atrial fib, I advised patient in earlier follow-up with cardiology. We will make an appt for her. Furthermore, her last echocardiogram was 2 years ago.  I reordered echocardiogram as well.  Last echocardiogram had normal ejection fraction.  Close follow-up

## 2018-12-09 NOTE — Assessment & Plan Note (Addendum)
Labored in speech with walking. Increased sputum. Suspect in part exacerbation.  In part chronic.  Patient is afebrile, no acute respiratory distress.  Walking sa02 > 94%.  She is currently on prednisone taper called in from her rheumatologist.  I have also sent in an oral antibiotic doxycycline due to concern for COPD exacerbation.  Close vigilance advised, patient will let me know if she is not better.

## 2018-12-09 NOTE — Progress Notes (Signed)
Subjective:    Patient ID: Brandy Armstrong, female    DOB: Jul 24, 1946, 72 y.o.   MRN: [id number]  CC: Brandy Armstrong is a 72 y.o. female who presents today for follow up.   HPI:  Concern with weight gain. Breathing has gotten worse. This has been a slow onset, not sudden.    Trouble walking dog due joint pain and sob. No CP, left arm numbness, le edema.    Pitfalls are sugar and Eating bread.   No h/o seizure, alcohol use.   Depression- no longer on zoloft. Doesn't want to leave the house. No si/hi. Tried wellbutrin in the past.   Atrial fib- on elliquis. No palpitations. Follows with Dr Rockey Situ.   COPD- worsening SOB past couple of days.  Increased sputum.  No fevers.  No chest pain. Prednisone started yesterday hasnt helped yet.   RA- thinks having a 'flare'. Has been cleaning for inspection in her apartment which thinks has aggravated her hands. on humira. Follows with Dr Meda Coffee  Low back pain- Midline. No groin pain. No urinary incontinence.  No numbness in legs, buttucks. started on prednisone yesterday by Dr Meda Coffee , 'which really helps.'   peripheral neuropathy in feet-Would like refill of Gabapentin of 600mg  BID. Pending an appt with neurology 12/2018.  Receiving iron infusions under oncology, last 11/25/2018 Rheumatology gave her Flexeril, referral to physical therapy for low back pain.  Pulmonology - dr simonds. 12/2017. Missed 3 months f/u.  Insurance wouldn't pay for pulmonary rehab  [facility] 2017 [facility]. Normal LEF HISTORY:  Past Medical History:  Diagnosis Date  . Atrial fibrillation [facility])    a. s/p ablation 01/2012 in [facility] by Dr. Boyd Kerbs;  b. On sotalol & Xarelto;  c. 02/2014 Echo: EF 50-55%, mild conc LVH, nl LA size/structure;  d. Recurrent afib 8/15 & 08/29/2014.  . Barretts esophagus   . Chest pain    a. 02/2014 Myoview: Ef 50%, no ischemia.  . Colon polyps   . COPD (chronic obstructive pulmonary disease) ([place])   . COPD (chronic obstructive pulmonary disease)  ([place])   . Depression with anxiety   . GERD (gastroesophageal reflux disease)   . Neuropathy   . Rectal fistula   . Rheumatoid arthritis ([place])    On methotrexate and orencia  . Urinary incontinence   . Vitamin D deficiency    Past Surgical History:  Procedure Laterality Date  . ABDOMINAL HYSTERECTOMY  1992  . CARDIAC ELECTROPHYSIOLOGY STUDY AND ABLATION  2013  . CHOLECYSTECTOMY  1987  . COLONOSCOPY N/A 05/08/2015   Procedure: COLONOSCOPY;  Surgeon: Manya Silvas, MD;  Location[facility] ENDOSCOPY;  Service: Endoscopy;  Laterality: N/A;  . ESOPHAGOGASTRODUODENOSCOPY N/A 05/06/2015   Procedure: ESOPHAGOGASTRODUODENOSCOPY (EGD);  Surgeon: Lollie Sails, MD;  Location: [facility] ENDOSCOPY;  Service: Endoscopy;  Laterality: N/A;  . OOPHORECTOMY    . oophrectomy Bilateral 1992   Family History  Problem Relation Age of Onset  . Depression Mother   . Pancreatic cancer Mother   . Colon cancer Father   . Breast cancer Sister 68  . Diabetes Brother   . Breast cancer Cousin   . Neuropathy Neg Hx     Allergies: Latex Current Outpatient Medications on File Prior to Visit  Medication Sig Dispense Refill  . albuterol (PROVENTIL HFA;VENTOLIN HFA) 108 (90 Base) MCG/ACT inhaler Inhale 1-2 puffs into the lungs every 4 (four) hours as needed for wheezing or shortness of breath. 1 Inhaler 10  . budesonide-formoterol (  SYMBICORT) 160-4.5 MCG/ACT inhaler INHALE 2 PUFFS TWO (2) TIMES A DAY. 30.6 Inhaler 1  . Cyanocobalamin 500 MCG/0.1ML SOLN Place 0.2 mLs (1,000 mcg total) into the nose daily. 1 Bottle 3  . cyclobenzaprine (FLEXERIL) 5 MG tablet Take 1 tablet (5 mg total) by mouth at bedtime. 30 tablet 1  . ELIQUIS 5 MG TABS tablet Take 1 tablet (5 mg total) by mouth 2 (two) times daily. 180 tablet 3  . ferrous sulfate 325 (65 FE) MG EC tablet Take 1 tablet (325 mg total) by mouth 2 (two) times daily with a meal. 60 tablet 3  . furosemide (LASIX) 20 MG tablet TAKE 1 TABLET BY MOUTH EVERY DAY AS NEEDED 90  tablet 2  . HUMIRA PEN 40 MG/0.4ML PNKT     . hydroxychloroquine (PLAQUENIL) 200 MG tablet Take 200 mg by mouth.    [name] ibuprofen (ADVIL,MOTRIN) 400 MG tablet     . lidocaine (XYLOCAINE) 5 % ointment Apply 1 application topically as needed.    . propranolol ER (INDERAL LA) 80 MG 24 hr capsule Take 1 capsule (80 mg total) by mouth daily. 90 capsule 3  . ranitidine (ZANTAC) 150 MG tablet Take 1 tablet (150 mg total) by mouth 2 (two) times daily. 60 tablet 4  . SPIRIVA RESPIMAT 2.5 MCG/ACT AERS 2 PUFFS BY MOUTH DAILY. 3 Inhaler 1  . Syringe/Needle, Disp, (SYRINGE 3CC/25GX5/8") 25G X 5/8" 3 ML MISC Use as directed 5 each 0  . loratadine (CLARITIN) 10 MG tablet Take 1 tablet (10 mg total) by mouth daily. (Patient not taking: Reported on 12/09/2018) 30 tablet 11   No current facility-administered medications on file prior to visit.     Social History   Tobacco Use  . Smoking status: Former Smoker    Packs/day: 1.00    Years: 40.00    Pack years: 40.00    Last attempt to quit: 12/16/2011    Years since quitting: 6.9  . Smokeless tobacco: Never Used  Substance Use Topics  . Alcohol use: Not Currently    Alcohol/week: 0.0 standard drinks    Comment: Occasionally has a drink  . Drug use: No    Review of Systems  Constitutional: Positive for fatigue. Negative for chills and fever.  HENT: Positive for congestion. Negative for sinus pressure and trouble swallowing.   Respiratory: Positive for cough, shortness of breath and wheezing.   Cardiovascular: Negative for chest pain and palpitations.  Gastrointestinal: Negative for nausea and vomiting.  Musculoskeletal: Positive for arthralgias and back pain.  Neurological: Positive for numbness.  Psychiatric/Behavioral: Negative for suicidal ideas. The patient is nervous/anxious.       Objective:    BP 132/80 (BP Location: Left Arm, Patient Position: Sitting, Cuff Size: Large)   Pulse 74   Temp 98 F (36.7 C)   Wt 227 lb 1.9 oz (103 kg)    SpO2 94%   BMI 36.66 kg/m  BP Readings from Last 3 Encounters:  12/09/18 132/80  11/25/18 100/60  11/18/18 101/70   Wt Readings from Last 3 Encounters:  12/09/18 227 lb 1.9 oz (103 kg)  11/04/18 222 lb 8 oz (100.9 kg)  09/30/18 221 lb 9.6 oz (100.5 kg)    Physical Exam  Constitutional: She appears well-developed and well-nourished.  Eyes: Conjunctivae are normal.  Cardiovascular: Normal rate, regular rhythm, normal heart sounds and normal pulses.  Pulmonary/Chest: Effort normal and breath sounds normal. She has no wheezes. She has no rhonchi. She has no rales.  Musculoskeletal:  Lumbar back: She exhibits tenderness and pain. She exhibits normal range of motion, no bony tenderness, no swelling, no edema and no spasm.       Arms: Full range of motion with flexion, tension, lateral side bends. No bony tenderness. No pain, numbness, tingling elicited with single leg raise bilaterally.   Neurological: She is alert. She has normal strength. No sensory deficit.  Reflex Scores:      Patellar reflexes are 2+ on the right side and 2+ on the left side. Skin: Skin is warm and dry.  Psychiatric: She has a normal mood and affect. Her speech is normal and behavior is normal. Thought content normal.  Vitals reviewed.      Assessment & Plan:   Problem List Items Addressed This Visit      Cardiovascular and Mediastinum   Atrial fibrillation ([place])    I suspect shortness of breath is multifactorial including weight gain, deconditioning, COPD however in the setting of atrial fib, I advised patient in earlier follow-up with cardiology. We will make an appt for her. Furthermore, her last echocardiogram was 2 years ago.  I reordered echocardiogram as well.  Last echocardiogram had normal ejection fraction.  Close follow-up      Relevant Orders   ECHOCARDIOGRAM COMPLETE     Respiratory   COPD exacerbation ([place])    Labored in speech with walking. Increased sputum. Suspect in part  exacerbation.  In part chronic.  Patient is afebrile, no acute respiratory distress.  Walking sa02 > 94%.  She is currently on prednisone taper called in from her rheumatologist.  I have also sent in an oral antibiotic doxycycline due to concern for COPD exacerbation.  Close vigilance advised, patient will let me know if she is not better.      Relevant Medications   doxycycline (VIBRA-TABS) 100 MG tablet   Other Relevant Orders   ECHOCARDIOGRAM COMPLETE     Nervous and Auditory   Neuropathy    Chronic.  Symptoms controlled on gabapentin 600 mg twice daily.  She is also pending referral with neurology.  Will follow        Musculoskeletal and Integument   Rheumatoid arthritis ([place])    Suspect current RA flare.  Pending x-ray of low back.  Based on exam and prior XRs, suspect osteoarthritis playing a role as well.  referral to physical therapy.        Other   Chronic back pain - Primary   Relevant Medications   gabapentin (NEURONTIN) 600 MG tablet   Other Relevant Orders   Ambulatory referral to Sleep Studies   DG Lumbar Spine Complete   Ambulatory referral to Physical Therapy   Depression, recurrent ([place])    Longstanding.  Patient's not on Zoloft at this time.  Based on our joint concern for her weight gain, obesity, we jointly agreed trial of Wellbutrin for significant depressive symptoms.  Close follow-up.      Relevant Medications   buPROPion (WELLBUTRIN XL) 150 MG 24 hr tablet       I have discontinued Kameryn C. Schoenbeck "Mekaila Karan"'s triamcinolone and gabapentin. I am also having her start on gabapentin, buPROPion, and doxycycline. Additionally, I am having her maintain her hydroxychloroquine, lidocaine, albuterol, ELIQUIS, propranolol ER, cyclobenzaprine, SYRINGE 3CC/25GX5/8", SPIRIVA RESPIMAT, ferrous sulfate, loratadine, HUMIRA PEN, furosemide, ranitidine, ibuprofen, Cyanocobalamin, and budesonide-formoterol.   Meds ordered this encounter  Medications  . gabapentin  (NEURONTIN) 600 MG tablet    Sig: Take 1 tablet (600 mg total) by mouth  2 (two) times daily.    Dispense:  120 tablet    Refill:  2    Order Specific Question:   Supervising Provider    Answer:   Deborra Medina L [[id number]]  . buPROPion (WELLBUTRIN XL) 150 MG 24 hr tablet    Sig: Start 150 mg ER PO qam, increase after 3 days to 300 mg qam.    Dispense:  60 tablet    Refill:  3    Order Specific Question:   Supervising Provider    Answer:   Deborra Medina L [[id number]]  . doxycycline (VIBRA-TABS) 100 MG tablet    Sig: Take 1 tablet (100 mg total) by mouth 2 (two) times daily for 7 days.    Dispense:  14 tablet    Refill:  0    Order Specific Question:   Supervising Provider    Answer:   Crecencio Mc [[id number]]    Return precautions given.   Risks, benefits, and alternatives of the medications and treatment plan prescribed today were discussed, and patient expressed understanding.   Education regarding symptom management and diagnosis given to patient on AVS.  Continue to follow with [name], FNP for routine health maintenance.   [name] and I agreed with plan.   Mable Paris, FNP

## 2018-12-09 NOTE — Assessment & Plan Note (Signed)
Longstanding.  Patient's not on Zoloft at this time.  Based on our joint concern for her weight gain, obesity, we jointly agreed trial of Wellbutrin for significant depressive symptoms.  Close follow-up.

## 2018-12-09 NOTE — Assessment & Plan Note (Signed)
Chronic.  Symptoms controlled on gabapentin 600 mg twice daily.  She is also pending referral with neurology.  Will follow

## 2018-12-11 ENCOUNTER — Encounter: Payer: Self-pay | Admitting: Family

## 2018-12-15 ENCOUNTER — Encounter: Payer: Self-pay | Admitting: Family

## 2018-12-15 ENCOUNTER — Other Ambulatory Visit: Payer: Self-pay | Admitting: Family

## 2018-12-15 ENCOUNTER — Telehealth: Payer: Self-pay | Admitting: *Deleted

## 2018-12-15 ENCOUNTER — Telehealth: Payer: Self-pay | Admitting: Family

## 2018-12-15 DIAGNOSIS — I700 Atherosclerosis of aorta: Secondary | ICD-10-CM

## 2018-12-15 DIAGNOSIS — G8929 Other chronic pain: Secondary | ICD-10-CM

## 2018-12-15 DIAGNOSIS — M549 Dorsalgia, unspecified: Principal | ICD-10-CM

## 2018-12-15 MED ORDER — ROSUVASTATIN CALCIUM 10 MG PO TABS: 10.0000 mg | ORAL_TABLET | Freq: Every day | ORAL | 3 refills | Status: DC

## 2018-12-15 MED ORDER — TRAMADOL HCL 50 MG PO TABS: 50.0000 mg | ORAL_TABLET | Freq: Two times a day (BID) | ORAL | 0 refills | Status: DC | PRN

## 2018-12-15 NOTE — Telephone Encounter (Signed)
Copied from Media [id number]. Topic: General - Other >> Dec 14, 2018  5:23 PM Alanda Slim E wrote: Reason for CRM: Pt called and wants to speak with Dr. Vidal Schwalbe about her back pain and is waiting for the results from her DG Lumbar Spine xray. She wanted to see if Dr. Vidal Schwalbe has looked at it. Pt stated she almost fell out in the store this evening and is in serious pain and thinks something is wrong. Please advise

## 2018-12-15 NOTE — Telephone Encounter (Signed)
Call patient I sent her a very long my chart message and also sent in tramadol.  Please ensure she  has  read and has no questions.  She will need a follow-up appointment in 3 months - we wil do a controlled  substance contract at that time.    I asked her to confirm that she has no history of seizures as she may not be able tramadol if she does. Please confirm this

## 2018-12-15 NOTE — Telephone Encounter (Signed)
Pulled X-ray and notified in office MD that patient has dilation of the aorta and was advised by MD to call patient and have patient go to the ED . Called patient and advised patient of possible changes on X-ray and MD recommendations to go to ED. Patient stated she would after getting a bath , advised patient she should just go on to ED. Called and advised patient PCP.Stated she would call patient.

## 2018-12-15 NOTE — Progress Notes (Signed)
I looked up patient on [place] Controlled Substances Reporting System and saw no activity that raised concern of inappropriate use.   

## 2018-12-15 NOTE — Telephone Encounter (Signed)
Call patient after speaking with nurse [name] from our office.  Advised her that I had not received her MyChart messages. She described the back pain as low right sided.  Worse with activity.  No back pain at rest.  Describes after grocery shopping  her back pain has worsened.  She feels weak.  No falls.  Continues to have peripheral neuropathy which is unchanged.  She is gabapentin for this.  She is taking gabapentin today as well as Advil with no improvement of her low back pain.  She would like something stronger for pain.   Former smoker.  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  Advised her that I would page, call vascular which I have done today.  I received a message from their office, Kim.  She has been scheduled to see them in 2 days.  Patient comfortable with waiting for this appointment.  Advised her that back pain can be a sign can be a symptom of aneurysm, and if she had any new or worsening back pain or new symptoms altogether, she needed to go to the emergency room.  Patient verbalized understanding of this.

## 2018-12-16 NOTE — Telephone Encounter (Signed)
Just FYI. I spoke with patient & she had read mychart message. She has no hx of seizures. Patient is scheduled for January to come to sign pain contract.

## 2018-12-16 NOTE — Telephone Encounter (Signed)
noted 

## 2018-12-17 ENCOUNTER — Ambulatory Visit (INDEPENDENT_AMBULATORY_CARE_PROVIDER_SITE_OTHER): Payer: Medicare Other | Admitting: Pulmonary Disease

## 2018-12-17 ENCOUNTER — Encounter: Payer: Self-pay | Admitting: Pulmonary Disease

## 2018-12-17 ENCOUNTER — Ambulatory Visit (INDEPENDENT_AMBULATORY_CARE_PROVIDER_SITE_OTHER): Payer: Medicare Other | Admitting: Vascular Surgery

## 2018-12-17 ENCOUNTER — Telehealth: Payer: Self-pay | Admitting: Family

## 2018-12-17 ENCOUNTER — Encounter (INDEPENDENT_AMBULATORY_CARE_PROVIDER_SITE_OTHER): Payer: Self-pay | Admitting: Vascular Surgery

## 2018-12-17 VITALS — BP 130/83 | HR 109 | Resp 16 | Ht 66.5 in | Wt 218.0 lb

## 2018-12-17 VITALS — BP 112/88 | HR 107 | Resp 16 | Ht 65.0 in | Wt 218.0 lb

## 2018-12-17 DIAGNOSIS — I480 Paroxysmal atrial fibrillation: Secondary | ICD-10-CM | POA: Diagnosis not present

## 2018-12-17 DIAGNOSIS — K219 Gastro-esophageal reflux disease without esophagitis: Secondary | ICD-10-CM

## 2018-12-17 DIAGNOSIS — J441 Chronic obstructive pulmonary disease with (acute) exacerbation: Secondary | ICD-10-CM

## 2018-12-17 DIAGNOSIS — J438 Other emphysema: Secondary | ICD-10-CM

## 2018-12-17 DIAGNOSIS — I739 Peripheral vascular disease, unspecified: Secondary | ICD-10-CM | POA: Diagnosis not present

## 2018-12-17 DIAGNOSIS — Z87891 Personal history of nicotine dependence: Secondary | ICD-10-CM

## 2018-12-17 DIAGNOSIS — J449 Chronic obstructive pulmonary disease, unspecified: Secondary | ICD-10-CM | POA: Diagnosis not present

## 2018-12-17 DIAGNOSIS — I714 Abdominal aortic aneurysm, without rupture, unspecified: Secondary | ICD-10-CM

## 2018-12-17 DIAGNOSIS — R0609 Other forms of dyspnea: Secondary | ICD-10-CM

## 2018-12-17 MED ORDER — METHYLPREDNISOLONE ACETATE 80 MG/ML IJ SUSP
80.0000 mg | Freq: Once | INTRAMUSCULAR | Status: AC
  Administered 2018-12-17: 80 mg via INTRAMUSCULAR

## 2018-12-17 MED ORDER — AZITHROMYCIN 250 MG PO TABS: ORAL_TABLET | ORAL | 0 refills | Status: AC

## 2018-12-17 MED ORDER — METHYLPREDNISOLONE ACETATE 40 MG/ML IJ SUSP
40.0000 mg | Freq: Once | INTRAMUSCULAR | Status: AC
  Administered 2018-12-17: 40 mg via INTRAMUSCULAR

## 2018-12-17 NOTE — Telephone Encounter (Signed)
FYI, I called pt to scheduled echo pt states she does not need to get a echo done at this time. Please advise? Thank you!

## 2018-12-17 NOTE — Progress Notes (Signed)
PULMONARY OFFICE FOLLOW-UP NOTE  Requesting MD/Service: Self Date of initial consultation: 11/04/17 Reason for consultation: COPD  PT PROFILE: 72 y.o. female former smoker (1 PPD X 45 yrs, quit 2012) previously seen by Dr Lake Bells for COPD  DATA: 03/11/14 CTA chest: No pulmonary emboli or acute abnormality 03/24/14 office spirometry: Consistent with severe obstruction.  FEV1 is 1.12 L (44%) 12/25/17 PFTs: Mild to moderate mixed restriction/obstruction.  FEV1 1.75 L (72%), FEV1/FVC 75%, TLC 4.20 L (80%), RV 1.87 L (89%), DLCO 10.0 (40%), DLCO/VA 89% predicted  INTERVAL: Last seen 01/01/18. No major pulmonary events  SUBJ:  Here to re-establish care. She remains on Symbicort and Spiriva. However, she reports increasing DOE over past 3-4 months and now has 1 week of cough, yellow-green mucus, chest tightness. Despite these symptoms, she rarely uses albuterol. She denies CP, fever, hemoptysis, LE edema and calf tenderness. She has chronic AF and is chronically anticoagulated. There have been no changes made in her cardiac meds since last visit.   Vitals:   12/17/18 1156 12/17/18 1201  BP:  112/88  Pulse:  (!) 107  Resp: 16   SpO2:  96%  Weight: 218 lb (98.9 kg)   Height: 5\' 5"  (1.651 m)   RA  EXAM:  Gen: Obese, NAD HEENT: NCAT, sclera white, oropharynx normal Neck: No LAN, thyromegaly, JVD Lungs: breath sounds mildly diminished, few scattered wheezes Cardiovascular: IRIR, rate controlled, no murmurs noted Abdomen: Soft, nontender, normal BS Ext: without clubbing, cyanosis. Trace symmetric edema Neuro: unsteady ambulation but no focal deficits Skin: Limited exam, no lesions noted  DATA:   BMP Latest Ref Rng & Units 10/12/2018 06/02/2018 08/06/2017  Glucose 70 - 99 mg/dL 99 89 114(H)  BUN 6 - 23 mg/dL 12 16 12   Creatinine 0.40 - 1.20 mg/dL 0.87 0.97 1.06  BUN/Creat Ratio 11 - 26 - - -  Sodium 135 - 145 mEq/L 139 135 136  Potassium 3.5 - 5.1 mEq/L 4.2 3.8 4.5  Chloride 96 - 112  mEq/L 103 102 100  CO2 19 - 32 mEq/L 32 23 31  Calcium 8.4 - 10.5 mg/dL 9.2 9.5 9.3    CBC Latest Ref Rng & Units 11/02/2018 10/12/2018 09/04/2018  WBC 4.0 - 10.5 K/uL 9.0 5.6 6.6  Hemoglobin 12.0 - 15.0 g/dL 10.9(L) 11.6(L) 10.3(L)  Hematocrit 36.0 - 46.0 % 36.3 37.8 32.3(L)  Platelets 150 - 400 K/uL 229 239.0 213    CXR: No recent film  IMPRESSION:     ICD-10-CM   1. COPD with acute exacerbation (HCC) J44.1 methylPREDNISolone acetate (DEPO-MEDROL) injection 80 mg    methylPREDNISolone acetate (DEPO-MEDROL) injection 40 mg  2. COPD, moderate ([place]) J44.9 DG Chest 2 View  3. Exertional dyspnea, likely multifactorial (chronic atrial fibrillation) R06.09    It seems that LE weakness and unsteadiness are more limiting than DOE. Nonetheless, she has symptoms and exam findings c/w AECOPD   PLAN:  Depomedrol 120 mg administered in office today (she prefers to pred) Z pak ordered Continue Symbicort and Spiriva Continue albuterol inhaler as needed  Follow up in 3-4 weeks with CXR prior   Merton Border, MD PCCM service Mobile [phone] Pager [phone] 12/17/2018 4:58 PM

## 2018-12-17 NOTE — Progress Notes (Signed)
MRN : [id number]  Brandy Armstrong is a 72 y.o. (October 06, 1946) female who presents with chief complaint of  Chief Complaint  Patient presents with  . New Patient (Initial Visit)  .  History of Present Illness:   The patient presents to the office for evaluation of an abdominal aortic aneurysm. The aneurysm was found incidentally by lumbar plain fils for back pain.  The patient has a well-documented history of rheumatoid arthritis which affects multiple joints including her back.  Consequently, her back pain is not new and by her admission has not changed in its quality or character very much. Patient denies abdominal pain or unusual back pain, no other abdominal complaints.  No history of an acute onset of painful blue discoloration of the toes.     No family history of AAA.   Patient denies amaurosis fugax or TIA symptoms. There is no history of claudication or rest pain symptoms of the lower extremities.  The patient denies angina or shortness of breath.  Plain X-ray of the lumbar spine is reviewed by me and shows a 3.5 cm AAA(this is enlarged because the film is magnified to show the spine better).  Current Meds  Medication Sig  . albuterol (PROVENTIL HFA;VENTOLIN HFA) 108 (90 Base) MCG/ACT inhaler Inhale 1-2 puffs into the lungs every 4 (four) hours as needed for wheezing or shortness of breath.  . budesonide-formoterol (SYMBICORT) 160-4.5 MCG/ACT inhaler INHALE 2 PUFFS TWO (2) TIMES A DAY.  . cyclobenzaprine (FLEXERIL) 5 MG tablet Take 1 tablet (5 mg total) by mouth at bedtime.  [name] ELIQUIS 5 MG TABS tablet Take 1 tablet (5 mg total) by mouth 2 (two) times daily.  [name] esomeprazole (NEXIUM) 20 MG capsule Take 20 mg by mouth daily at 12 noon.  . furosemide (LASIX) 20 MG tablet TAKE 1 TABLET BY MOUTH EVERY DAY AS NEEDED  . gabapentin (NEURONTIN) 600 MG tablet Take 1 tablet (600 mg total) by mouth 2 (two) times daily.  [name] HUMIRA PEN 40 MG/0.4ML PNKT   . hydroxychloroquine (PLAQUENIL) 200 MG  tablet Take 200 mg by mouth.  [name] ibuprofen (ADVIL,MOTRIN) 400 MG tablet   . lidocaine (XYLOCAINE) 5 % ointment Apply 1 application topically as needed.  . propranolol ER (INDERAL LA) 80 MG 24 hr capsule Take 1 capsule (80 mg total) by mouth daily.  . ranitidine (ZANTAC) 150 MG tablet Take 1 tablet (150 mg total) by mouth 2 (two) times daily.  [name] SPIRIVA RESPIMAT 2.5 MCG/ACT AERS 2 PUFFS BY MOUTH DAILY.  . traMADol (ULTRAM) 50 MG tablet Take 1 tablet (50 mg total) by mouth every 12 (twelve) hours as needed for severe pain.  . [DISCONTINUED] Syringe/Needle, Disp, (SYRINGE 3CC/25GX5/8") 25G X 5/8" 3 ML MISC Use as directed    Past Medical History:  Diagnosis Date  . Atrial fibrillation [facility])    a. s/p ablation 01/2012 in [facility] by Dr. Boyd Kerbs;  b. On sotalol & Xarelto;  c. 02/2014 Echo: EF 50-55%, mild conc LVH, nl LA size/structure;  d. Recurrent afib 8/15 & 08/29/2014.  . Barretts esophagus   . Chest pain    a. 02/2014 Myoview: Ef 50%, no ischemia.  . Colon polyps   . COPD (chronic obstructive pulmonary disease) ([place])   . COPD (chronic obstructive pulmonary disease) ([place])   . Depression with anxiety   . GERD (gastroesophageal reflux disease)   . Neuropathy   . Rectal fistula   . Rheumatoid arthritis (HCC)    On methotrexate and  orencia  . Urinary incontinence   . Vitamin D deficiency     Past Surgical History:  Procedure Laterality Date  . ABDOMINAL HYSTERECTOMY  1992  . CARDIAC ELECTROPHYSIOLOGY STUDY AND ABLATION  2013  . CHOLECYSTECTOMY  1987  . COLONOSCOPY N/A 05/08/2015   Procedure: COLONOSCOPY;  Surgeon: Manya Silvas, MD;  Location: [facility] ENDOSCOPY;  Service: Endoscopy;  Laterality: N/A;  . ESOPHAGOGASTRODUODENOSCOPY N/A 05/06/2015   Procedure: ESOPHAGOGASTRODUODENOSCOPY (EGD);  Surgeon: Lollie Sails, MD;  Location: [facility] ENDOSCOPY;  Service: Endoscopy;  Laterality: N/A;  . OOPHORECTOMY    . oophrectomy Bilateral 1992    Social History Social History   Tobacco Use    . Smoking status: Former Smoker    Packs/day: 1.00    Years: 40.00    Pack years: 40.00    Last attempt to quit: 12/16/2011    Years since quitting: 7.0  . Smokeless tobacco: Never Used  Substance Use Topics  . Alcohol use: Not Currently    Alcohol/week: 0.0 standard drinks    Comment: Occasionally has a drink  . Drug use: No    Family History Family History  Problem Relation Age of Onset  . Depression Mother   . Pancreatic cancer Mother   . Colon cancer Father   . Breast cancer Sister 15  . Diabetes Brother   . Breast cancer Cousin   . Neuropathy Neg Hx   No family history of bleeding/clotting disorders, porphyria or autoimmune disease   Allergies  Allergen Reactions  . Latex     Itching Itching     REVIEW OF SYSTEMS (Negative unless checked)  Constitutional: [] Weight loss  [] Fever  [] Chills Cardiac: [] Chest pain   [] Chest pressure   [] Palpitations   [] Shortness of breath when laying flat   [] Shortness of breath with exertion. Vascular:  [x] Pain in legs with walking   [] Pain in legs at rest  [] History of DVT   [] Phlebitis   [] Swelling in legs   [] Varicose veins   [] Non-healing ulcers Pulmonary:   [] Uses home oxygen   [] Productive cough   [] Hemoptysis   [] Wheeze  [x] COPD   [x] Asthma Neurologic:  [] Dizziness   [] Seizures   [] History of stroke   [] History of TIA  [] Aphasia   [] Vissual changes   [] Weakness or numbness in arm   [] Weakness or numbness in leg Musculoskeletal:   [] Joint swelling   [x] Joint pain   [x] Low back pain Hematologic:  [] Easy bruising  [] Easy bleeding   [] Hypercoagulable state   [] Anemic Gastrointestinal:  [] Diarrhea   [] Vomiting  [] Gastroesophageal reflux/heartburn   [] Difficulty swallowing. Genitourinary:  [] Chronic kidney disease   [] Difficult urination  [] Frequent urination   [] Blood in urine Skin:  [] Rashes   [] Ulcers  Psychological:  [] History of anxiety   [x]  History of major depression.  Physical Examination  Vitals:   12/17/18 0914   BP: 130/83  Pulse: (!) 109  Resp: 16  Weight: 218 lb (98.9 kg)  Height: 5' 6.5" (1.689 m)   Body mass index is 34.66 kg/m. Gen: WD/WN, NAD Head: [place]/AT, No temporalis wasting.  Ear/Nose/Throat: Hearing grossly intact, nares w/o erythema or drainage, poor dentition Eyes: PER, EOMI, sclera nonicteric.  Neck: Supple, no masses.  No bruit or JVD.  Pulmonary:  Good air movement, clear to auscultation bilaterally, no use of accessory muscles.  Cardiac: RRR, normal S1, S2, no Murmurs. Vascular:  No carotid bruits Vessel Right Left  Radial Palpable Palpable  Carotid Palpable Palpable  Popliteal Not enlarged Palpable Not enlarged  Palpable  PT Not Palpable Not Palpable  DP Not Palpable Not Palpable   Gastrointestinal: soft, non-distended. No guarding/no peritoneal signs.  Musculoskeletal: M/S 5/5 throughout.  arthritic deformity of multiple joints no atrophy.  Neurologic: CN 2-12 intact. Pain and light touch intact in extremities.  Symmetrical.  Speech is fluent. Motor exam as listed above. Psychiatric: Judgment intact, Mood & affect appropriate for pt's clinical situation. Dermatologic: No rashes or ulcers noted.  No changes consistent with cellulitis. Lymph : No Cervical lymphadenopathy, no lichenification or skin changes of chronic lymphedema.  CBC Lab Results  Component Value Date   WBC 9.0 11/02/2018   HGB 10.9 (L) 11/02/2018   HCT 36.3 11/02/2018   MCV 83.1 11/02/2018   PLT 229 11/02/2018    BMET    Component Value Date/Time   NA 139 10/12/2018 1040   NA 141 01/02/2016 1436   NA 139 01/04/2015 0521   K 4.2 10/12/2018 1040   K 3.8 01/04/2015 0521   CL 103 10/12/2018 1040   CL 106 01/04/2015 0521   CO2 32 10/12/2018 1040   CO2 26 01/04/2015 0521   GLUCOSE 99 10/12/2018 1040   GLUCOSE 95 01/04/2015 0521   BUN 12 10/12/2018 1040   BUN 12 01/02/2016 1436   BUN 8 01/04/2015 0521   CREATININE 0.87 10/12/2018 1040   CREATININE 0.84 01/04/2015 0521   CALCIUM 9.2  10/12/2018 1040   CALCIUM 8.5 01/04/2015 0521   GFRNONAA 58 (L) 06/02/2018 1052   GFRNONAA >60 01/04/2015 0521   GFRNONAA >60 08/29/2014 1653   GFRAA >60 06/02/2018 1052   GFRAA >60 01/04/2015 0521   GFRAA >60 08/29/2014 1653   CrCl cannot be calculated (Patient's most recent lab result is older than the maximum 21 days allowed.).  COAG Lab Results  Component Value Date   INR 1.27 05/06/2015   INR 2.99 05/04/2015   INR 1.6 01/02/2015    Radiology Dg Lumbar Spine Complete  Result Date: 12/10/2018 CLINICAL DATA:  Chronic low back pain. EXAM: LUMBAR SPINE - COMPLETE 4+ VIEW COMPARISON:  08/13/2016. FINDINGS: Five non-rib-bearing lumbar vertebrae. Stable mild dextroconvex thoracolumbar scoliosis. Facet degenerative changes in the mid and lower lumbar spine with stable mild anterolisthesis at the L4-5 level. Improved mild retrolisthesis at the L3-4 level. No pars defects or fractures are seen. Mild to moderate anterior spur formation is again demonstrated in the lower thoracic spine. Also noted are atheromatous arterial calcifications with possible mild aneurysmal dilatation of the infrarenal abdominal aorta, measuring 3.5 cm in AP diameter without correction for magnification. IMPRESSION: 1. Stable degenerative changes. 2. Possible mild aneurysmal dilatation of the infrarenal abdominal aorta. Electronically Signed   By: Claudie Revering M.D.   On: 12/10/2018 02:07     Assessment/Plan 1. AAA (abdominal aortic aneurysm) without rupture (HCC) No surgery or intervention at this time. The patient has an asymptomatic abdominal aortic aneurysm that is less than 5 cm in maximal diameter.  I have discussed the natural history of abdominal aortic aneurysm and the small risk of rupture for aneurysm less than 5 cm in size.  However, as these small aneurysms tend to enlarge over time, continued surveillance with ultrasound or CT scan is mandatory.  I have also discussed optimizing medical management with  hypertension and lipid control and the importance of abstinence from tobacco.  The patient is also encouraged to exercise a minimum of 30 minutes 4 times a week.  Should the patient develop new onset abdominal or back pain or signs of  peripheral embolization they are instructed to seek medical attention immediately and to alert the physician providing care that they have an aneurysm.  The patient voices their understanding. I have scheduled the patient to return in 6 months with an aortic duplex.  - VAS US AORTA/IVC/ILIACS; Future  2. PAD (peripheral artery disease) (HCC)  Recommend:  The patient has evidence of atherosclerosis of the lower extremities with claudication.  The patient does not voice lifestyle limiting changes at this point in time.  Noninvasive studies do not suggest clinically significant change.  No invasive studies, angiography or surgery at this time The patient should continue walking and begin a more formal exercise program.  The patient should continue antiplatelet therapy and aggressive treatment of the lipid abnormalities  No changes in the patient's medications at this time  The patient should continue wearing graduated compression socks 10-15 mmHg strength to control the mild edema.    3. Paroxysmal atrial fibrillation (HCC) Continue antiarrhythmia medications as already ordered, these medications have been reviewed and there are no changes at this time.  Continue anticoagulation as ordered by Cardiology Service   4. Other emphysema ([place]) Continue pulmonary medications and aerosols as already ordered, these medications have been reviewed and there are no changes at this time.    5. Gastroesophageal reflux disease, esophagitis presence not specified Continue PPI as already ordered, this medication has been reviewed and there are no changes at this time.  Avoidence of caffeine and alcohol  Moderate elevation of the head of the bed     Hortencia Pilar, MD  12/17/2018 11:21 AM

## 2018-12-17 NOTE — Patient Instructions (Signed)
Depo-Medrol 120 mg administered in office today Z-Pak (azithromycin) ordered.  Take as directed Follow-up in 3-4 weeks with chest x-ray prior to that visit

## 2018-12-18 ENCOUNTER — Telehealth: Payer: Self-pay | Admitting: Family

## 2018-12-18 NOTE — Telephone Encounter (Signed)
I returned Brandy Armstrong's call with [facility]. I was only able to leave a message since she was gone for the day. I will call back Monday to advise?

## 2018-12-18 NOTE — Telephone Encounter (Signed)
Advise to keep calling patient, patient has been busy seeing vascular surgery this week.  She told me that she would like to pursue physical therapy however had a very busy week.  Please advise him to keep calling

## 2018-12-18 NOTE — Telephone Encounter (Signed)
FYI

## 2018-12-18 NOTE — Telephone Encounter (Signed)
LMTCB to f/u with patient.

## 2018-12-18 NOTE — Telephone Encounter (Signed)
Strongly advise echo in setting of her shortness of breath Does she have f/u with gollan scheduled?

## 2018-12-18 NOTE — Telephone Encounter (Signed)
Copied from [place] [id number]. Topic: Quick Communication - Home Health Verbal Orders >> Dec 18, 2018  1:44 PM Margot Ables wrote: Caller/Agency: Jenny Reichmann w/[facility] Callback Number: [phone] Requesting OT/PT/Skilled Nursing/Social Work: PT Frequency: still delayed to start home health [place] has not been able to reach patient on the phone - I have provided emergency contact on [facility] phone # for her to try - she is hoping to start services no later than 12/30. Please advise if this may be detrimental to the patient.

## 2018-12-18 NOTE — Telephone Encounter (Signed)
FYI. Please advise.

## 2018-12-21 NOTE — Telephone Encounter (Signed)
LM for Cindy to call back if needed & encouraged her to please continue to call patient.

## 2018-12-25 ENCOUNTER — Telehealth: Payer: Self-pay | Admitting: Pulmonary Disease

## 2018-12-25 ENCOUNTER — Ambulatory Visit: Payer: Self-pay | Admitting: *Deleted

## 2018-12-25 NOTE — Unmapped (Signed)
Humira refill  Last ov: 06/22/18  Next ov: 01/04/2019

## 2018-12-25 NOTE — Telephone Encounter (Signed)
Yes schedule first avail Or advise Urgrent care over weekend.  We may have Saturday clinic tomorrow? Please check as that is an options of cough has improved; otherwise she may need UC today

## 2018-12-25 NOTE — Telephone Encounter (Signed)
Patient notified on what to take.

## 2018-12-25 NOTE — Telephone Encounter (Signed)
Patient asked what you would recommenced OTC for cough? I have advised UC she said that may be an option, but she was very reluctant.

## 2018-12-25 NOTE — Telephone Encounter (Signed)
Would you like me to try to her scheduled with Lauren first available?

## 2018-12-25 NOTE — Telephone Encounter (Signed)
Called and spoke to patient, let her know Dr. Alva Garnet is out of the office until Jan. Advised her to use her albuterol inhaler q4-6 hours and see if that helps. Also, if she does not feel she is getting better, to contact her PCP for further advisement. Patient aware with no further questions at this time.

## 2018-12-25 NOTE — Telephone Encounter (Signed)
Called pt back regarding congested cough and chest congestion. She saw her pulmonologist last week and was put on a zpac. She finished that.  But today she had a 10 mg prednisone that she had had and took it.  She says that her chest feels tight, no pain or heaviness. Is wheezing. She called the pulmonologist and was recommended to use her inhaler. Pt has not. Advised again to use her inhaler. She used it while on the phone. She said that it made her cough and somewhat better.  She has also used steam and that has helped.  She denies fever, blood in sputum, chest pain. Home care advice given to patient: using a humidifier, taking mucinex, drinking lots of fluids, cough drops as needed. And also to speak with a pharmacist for recommendations, over the counter. She is asking for a medication to be called in to her. Advised pt that she would need to be seen in the office for medication. Advised to call back if no relief from suggestions over the weekend. Will route to flow at [facility] at [facility].  Reason for Disposition . Cough  Answer Assessment - Initial Assessment Questions 1. ONSET: "When did the cough begin?"      For a while went away and now back since last night 2. SEVERITY: "How bad is the cough today?"      Productive at times 3. RESPIRATORY DISTRESS: "Describe your breathing."      wheezing 4. FEVER: "Do you have a fever?" If so, ask: "What is your temperature, how was it measured, and when did it start?"     Does not feel feverish but having some chills 5. HEMOPTYSIS: "Are you coughing up any blood?" If so ask: "How much?" (flecks, streaks, tablespoons, etc.)     no 6. TREATMENT: "What have you done so far to treat the cough?" (e.g., meds, fluids, humidifier)     Meds, fluids, steam over the stove 7. CARDIAC HISTORY: "Do you have any history of heart disease?" (e.g., heart attack, congestive heart failure)      COPD 8. LUNG HISTORY: "Do you have any history of lung  disease?"  (e.g., pulmonary embolus, asthma, emphysema)     no 9. PE RISK FACTORS: "Do you have a history of blood clots?" (or: recent major surgery, recent prolonged travel, bedridden)     no 10. OTHER SYMPTOMS: "Do you have any other symptoms? (e.g., runny nose, wheezing, chest pain)       Wheezing, chest congestion, 11. PREGNANCY: "Is there any chance you are pregnant?" "When was your last menstrual period?"       no 12. TRAVEL: "Have you traveled out of the country in the last month?" (e.g., travel history, exposures)       no  Protocols used: COUGH - ACUTE NON-PRODUCTIVE-A-AH

## 2018-12-25 NOTE — Telephone Encounter (Signed)
Advise delsym or Cloricidin HB

## 2018-12-27 MED ORDER — ADALIMUMAB PEN CITRATE FREE 40 MG/0.4 ML
SUBCUTANEOUS | 1 refills | 0 days | Status: CP
Start: 2018-12-27 — End: 2019-04-04

## 2018-12-28 ENCOUNTER — Telehealth: Payer: Self-pay | Admitting: *Deleted

## 2018-12-28 DIAGNOSIS — J40 Bronchitis, not specified as acute or chronic: Secondary | ICD-10-CM | POA: Diagnosis not present

## 2018-12-28 DIAGNOSIS — R05 Cough: Secondary | ICD-10-CM | POA: Diagnosis not present

## 2018-12-28 DIAGNOSIS — R062 Wheezing: Secondary | ICD-10-CM | POA: Diagnosis not present

## 2018-12-28 DIAGNOSIS — J441 Chronic obstructive pulmonary disease with (acute) exacerbation: Secondary | ICD-10-CM | POA: Diagnosis not present

## 2018-12-28 DIAGNOSIS — M1711 Unilateral primary osteoarthritis, right knee: Secondary | ICD-10-CM | POA: Diagnosis not present

## 2018-12-28 DIAGNOSIS — M059 Rheumatoid arthritis with rheumatoid factor, unspecified: Secondary | ICD-10-CM | POA: Diagnosis not present

## 2018-12-28 NOTE — Telephone Encounter (Signed)
FYI

## 2018-12-28 NOTE — Telephone Encounter (Signed)
Copied from [place] [id number]. Topic: Quick Communication - Home Health Verbal Orders >> Dec 28, 2018 12:44 PM Rayann Heman wrote: Caller/Agency: [place]/ [facility]  Callback Number: [phone]  She states that she will be going out on 01/01/19 to start home health services

## 2018-12-31 NOTE — Telephone Encounter (Signed)
Just a FYI

## 2018-12-31 NOTE — Telephone Encounter (Signed)
Jenny Reichmann RN, w/ [facility] calling to notify that the patient declined home health services.

## 2019-01-01 ENCOUNTER — Ambulatory Visit: Payer: Medicare Other | Admitting: Pulmonary Disease

## 2019-01-01 ENCOUNTER — Ambulatory Visit: Payer: Medicare Other | Admitting: Family

## 2019-01-01 NOTE — Telephone Encounter (Signed)
noted 

## 2019-01-07 ENCOUNTER — Other Ambulatory Visit: Payer: Self-pay | Admitting: Oncology

## 2019-01-07 ENCOUNTER — Inpatient Hospital Stay: Payer: Medicare Other | Attending: Oncology

## 2019-01-07 ENCOUNTER — Other Ambulatory Visit: Payer: Self-pay

## 2019-01-07 ENCOUNTER — Telehealth: Payer: Self-pay | Admitting: Pulmonary Disease

## 2019-01-07 DIAGNOSIS — Z87891 Personal history of nicotine dependence: Secondary | ICD-10-CM | POA: Insufficient documentation

## 2019-01-07 DIAGNOSIS — D500 Iron deficiency anemia secondary to blood loss (chronic): Secondary | ICD-10-CM | POA: Diagnosis not present

## 2019-01-07 DIAGNOSIS — E538 Deficiency of other specified B group vitamins: Secondary | ICD-10-CM | POA: Diagnosis not present

## 2019-01-07 DIAGNOSIS — Z7901 Long term (current) use of anticoagulants: Secondary | ICD-10-CM | POA: Diagnosis not present

## 2019-01-07 DIAGNOSIS — Z79899 Other long term (current) drug therapy: Secondary | ICD-10-CM | POA: Insufficient documentation

## 2019-01-07 DIAGNOSIS — I4891 Unspecified atrial fibrillation: Secondary | ICD-10-CM | POA: Insufficient documentation

## 2019-01-07 LAB — CBC WITH DIFFERENTIAL/PLATELET
Abs Immature Granulocytes: 0.02 10*3/uL (ref 0.00–0.07)
Basophils Absolute: 0 10*3/uL (ref 0.0–0.1)
Basophils Relative: 0 %
Eosinophils Absolute: 0 10*3/uL (ref 0.0–0.5)
Eosinophils Relative: 0 %
HCT: 41.2 % (ref 36.0–46.0)
Hemoglobin: 12.7 g/dL (ref 12.0–15.0)
Immature Granulocytes: 0 %
LYMPHS ABS: 0.8 10*3/uL (ref 0.7–4.0)
Lymphocytes Relative: 9 %
MCH: 26.8 pg (ref 26.0–34.0)
MCHC: 30.8 g/dL (ref 30.0–36.0)
MCV: 87.1 fL (ref 80.0–100.0)
MONO ABS: 0.5 10*3/uL (ref 0.1–1.0)
Monocytes Relative: 5 %
Neutro Abs: 7.4 10*3/uL (ref 1.7–7.7)
Neutrophils Relative %: 86 %
Platelets: 210 10*3/uL (ref 150–400)
RBC: 4.73 MIL/uL (ref 3.87–5.11)
RDW: 16 % — ABNORMAL HIGH (ref 11.5–15.5)
WBC: 8.7 10*3/uL (ref 4.0–10.5)
nRBC: 0 % (ref 0.0–0.2)

## 2019-01-07 LAB — FERRITIN: Ferritin: 31 ng/mL (ref 11–307)

## 2019-01-07 LAB — IRON AND TIBC
Iron: 47 ug/dL (ref 28–170)
Saturation Ratios: 14 % (ref 10.4–31.8)
TIBC: 343 ug/dL (ref 250–450)
UIBC: 296 ug/dL

## 2019-01-07 LAB — VITAMIN B12: Vitamin B-12: 343 pg/mL (ref 180–914)

## 2019-01-07 MED ORDER — FLUTICASONE-UMECLIDIN-VILANT 100-62.5-25 MCG/INH IN AEPB: 1.0000 | INHALATION_SPRAY | Freq: Every day | RESPIRATORY_TRACT | 2 refills | Status: DC

## 2019-01-07 MED ORDER — PREDNISONE 10 MG (21) PO TBPK: ORAL_TABLET | ORAL | 0 refills | Status: DC

## 2019-01-07 NOTE — Addendum Note (Signed)
Addended by: Darreld Mclean on: 01/07/2019 02:32 PM   Modules accepted: Orders

## 2019-01-07 NOTE — Telephone Encounter (Signed)
LM for patient to call back re: phone message and mychart response.

## 2019-01-07 NOTE — Telephone Encounter (Addendum)
I spoke with patient and let her know of Dr. Alva Garnet recommendations. She would like to try the trelegy and see if it helps. Will also send in prednisone pak. Recommended patient try flutter device (which she has) or mucinex for "rattle" in her chest. Patient aware.  Reiterated to the patient to STOP her spiriva and symbicort when she starts the Trelegy. She is aware.  Orders placed.

## 2019-01-08 ENCOUNTER — Inpatient Hospital Stay: Payer: Medicare Other

## 2019-01-08 ENCOUNTER — Encounter: Payer: Self-pay | Admitting: Oncology

## 2019-01-08 ENCOUNTER — Other Ambulatory Visit: Payer: Self-pay

## 2019-01-08 ENCOUNTER — Inpatient Hospital Stay (HOSPITAL_BASED_OUTPATIENT_CLINIC_OR_DEPARTMENT_OTHER): Payer: Medicare Other | Admitting: Oncology

## 2019-01-08 VITALS — BP 136/90 | HR 94 | Temp 96.9°F | Resp 18 | Wt 219.0 lb

## 2019-01-08 DIAGNOSIS — E538 Deficiency of other specified B group vitamins: Secondary | ICD-10-CM | POA: Diagnosis not present

## 2019-01-08 DIAGNOSIS — Z79899 Other long term (current) drug therapy: Secondary | ICD-10-CM

## 2019-01-08 DIAGNOSIS — I4891 Unspecified atrial fibrillation: Secondary | ICD-10-CM

## 2019-01-08 DIAGNOSIS — D500 Iron deficiency anemia secondary to blood loss (chronic): Secondary | ICD-10-CM

## 2019-01-08 DIAGNOSIS — Z87891 Personal history of nicotine dependence: Secondary | ICD-10-CM | POA: Diagnosis not present

## 2019-01-08 DIAGNOSIS — Z7901 Long term (current) use of anticoagulants: Secondary | ICD-10-CM | POA: Diagnosis not present

## 2019-01-08 NOTE — Progress Notes (Signed)
Hematology/Oncology Consult note [facility] Telephone:([phone] [phone]   Patient Care Team: [name], FNP as PCP - General (Family Medicine)  REFERRING PROVIDER: [name], FNP  REASON FOR VISIT Follow up for treatment of anemia and iron deficiency anemia.   HISTORY OF PRESENTING ILLNESS:  Brandy Armstrong is a  73 y.o.  female with PMH listed below who was referred to me for evaluation of anemia and elevated iron level.  Patient recently had lab work done which revealed anemia with hemoglobin count at 10.2 on 05/04/2018.  I reviewed patient's previous labs, Anemia is chronic, dated back to 2014. She recalls having 1 PRBC transfusion in the past and was told she has iron deficiency anemia. She takes oral OTC  iron supplements and anemia has improved.  Associated with fatigue, lack of energy. Denies weight loss, easy bruising, hematochezia, hemoptysis.  Last colonoscopy 2016 revealed polyps which were removed. No malignancy was found.  Last EGD: 2016 which showed gastritis.   Reviewed recent iron panel which showed elevated iron at 346, TSAT elevated at 89, ferritin 26. Denies any family history of hemachromatosis.  Recent B12 level showed decreased B12, she reports that she has been started on parental B12 injections with her PCP.  History RF, she takes plaquenil 200mg  daily.  History of Afib, takes Elqiuis 5mg  BID.   INTERVAL HISTORY Brandy Armstrong is a 73 y.o. female who has above history reviewed by me today presents for follow-up for management of iron deficiency anemia.  #Patient reports recent COPD flare.  Chest x-ray was obtained on 12/28/2018 which showed no pneumonia.  She was prescribed a course of steroid tapering.  Patient reports feeling better today.  Chronic fatigue at baseline. She takes Eliquis for atrial fibrillation.  Reports tolerating well.  No active bleeding events.Denies hematochezia, hematuria, hematemesis,  epistaxis, black tarry stool or easy bruising.   Review of Systems  Constitutional: Positive for malaise/fatigue. Negative for chills, fever and weight loss.  HENT: Negative for sore throat.   Eyes: Negative for redness.  Respiratory: Positive for cough and shortness of breath. Negative for wheezing.   Cardiovascular: Negative for chest pain, palpitations and leg swelling.  Gastrointestinal: Negative for abdominal pain, blood in stool, nausea and vomiting.  Genitourinary: Negative for dysuria.  Musculoskeletal: Negative for myalgias.  Skin: Negative for rash.  Neurological: Negative for dizziness, tingling and tremors.  Endo/Heme/Allergies: Does not bruise/bleed easily.  Psychiatric/Behavioral: Negative for hallucinations.    MEDICAL HISTORY:  Past Medical History:  Diagnosis Date  . Atrial fibrillation [facility])    a. s/p ablation 01/2012 in [facility] by Dr. Boyd Kerbs;  b. On sotalol & Xarelto;  c. 02/2014 Echo: EF 50-55%, mild conc LVH, nl LA size/structure;  d. Recurrent afib 8/15 & 08/29/2014.  . Barretts esophagus   . Chest pain    a. 02/2014 Myoview: Ef 50%, no ischemia.  . Colon polyps   . COPD (chronic obstructive pulmonary disease) ([place])   . COPD (chronic obstructive pulmonary disease) ([place])   . Depression with anxiety   . GERD (gastroesophageal reflux disease)   . Neuropathy   . Rectal fistula   . Rheumatoid arthritis ([place])    On methotrexate and orencia  . Urinary incontinence   . Vitamin D deficiency     SURGICAL HISTORY: Past Surgical History:  Procedure Laterality Date  . ABDOMINAL HYSTERECTOMY  1992  . CARDIAC ELECTROPHYSIOLOGY STUDY AND ABLATION  2013  . CHOLECYSTECTOMY  1987  .  COLONOSCOPY N/A 05/08/2015   Procedure: COLONOSCOPY;  Surgeon: Manya Silvas, MD;  Location: [facility] ENDOSCOPY;  Service: Endoscopy;  Laterality: N/A;  . ESOPHAGOGASTRODUODENOSCOPY N/A 05/06/2015   Procedure: ESOPHAGOGASTRODUODENOSCOPY (EGD);  Surgeon: Lollie Sails, MD;  Location: [facility]  ENDOSCOPY;  Service: Endoscopy;  Laterality: N/A;  . OOPHORECTOMY    . oophrectomy Bilateral 1992    SOCIAL HISTORY: Social History   Socioeconomic History  . Marital status: Single    Spouse name: Not on file  . Number of children: 0  . Years of education: 59  . Highest education level: Not on file  Occupational History  . Occupation: Retired  Scientific laboratory technician  . Financial resource strain: Not on file  . Food insecurity:    Worry: Not on file    Inability: Not on file  . Transportation needs:    Medical: Not on file    Non-medical: Not on file  Tobacco Use  . Smoking status: Former Smoker    Packs/day: 1.00    Years: 40.00    Pack years: 40.00    Last attempt to quit: 12/16/2011    Years since quitting: 7.0  . Smokeless tobacco: Never Used  Substance and Sexual Activity  . Alcohol use: Not Currently    Alcohol/week: 0.0 standard drinks    Comment: Occasionally has a drink  . Drug use: No  . Sexual activity: Never  Lifestyle  . Physical activity:    Days per week: Not on file    Minutes per session: Not on file  . Stress: Not on file  Relationships  . Social connections:    Talks on phone: Not on file    Gets together: Not on file    Attends religious service: Not on file    Active member of club or organization: Not on file    Attends meetings of clubs or organizations: Not on file    Relationship status: Not on file  . Intimate partner violence:    Fear of current or ex partner: Not on file    Emotionally abused: Not on file    Physically abused: Not on file    Forced sexual activity: Not on file  Other Topics Concern  . Not on file  Social History Narrative   Ms. Agredano was born and reared in [place]. She attended [employer] and graduated in 1971 with her [place] in Education. 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 She then worked for a [employer] in [place]. She is single. She is currently  retired. She loves reading. She loves to spend time with her dog.       Caffeine use: 2 cups coffee per day   2 glasses iced tea/ day   Right-handed    FAMILY HISTORY: Family History  Problem Relation Age of Onset  . Depression Mother   . Pancreatic cancer Mother   . Colon cancer Father   . Breast cancer Sister 51  . Diabetes Brother   . Breast cancer Cousin   . Neuropathy Neg Hx     ALLERGIES:  is allergic to latex.  MEDICATIONS:  Current Outpatient Medications  Medication Sig Dispense Refill  . albuterol (PROVENTIL HFA;VENTOLIN HFA) 108 (90 Base) MCG/ACT inhaler Inhale 1-2 puffs into the lungs every 4 (four) hours as needed for wheezing or shortness of breath. 1 Inhaler 10  . cyclobenzaprine (FLEXERIL) 5 MG tablet Take 1 tablet (5 mg total) by  mouth at bedtime. (Patient taking differently: Take 5 mg by mouth at bedtime as needed. ) 30 tablet 1  . ELIQUIS 5 MG TABS tablet Take 1 tablet (5 mg total) by mouth 2 (two) times daily. 180 tablet 3  . esomeprazole (NEXIUM) 20 MG capsule Take 20 mg by mouth daily at 12 noon.    . Fluticasone-Umeclidin-Vilant (TRELEGY ELLIPTA) 100-62.5-25 MCG/INH AEPB Inhale 1 puff into the lungs daily. 60 each 2  . furosemide (LASIX) 20 MG tablet TAKE 1 TABLET BY MOUTH EVERY DAY AS NEEDED 90 tablet 2  . gabapentin (NEURONTIN) 600 MG tablet Take 1 tablet (600 mg total) by mouth 2 (two) times daily. 120 tablet 2  . hydroxychloroquine (PLAQUENIL) 200 MG tablet Take 200 mg by mouth.    [name] ibuprofen (ADVIL,MOTRIN) 400 MG tablet Take 400 mg by mouth every 4 (four) hours as needed.     . lidocaine (XYLOCAINE) 5 % ointment Apply 1 application topically as needed.    . predniSONE (STERAPRED UNI-PAK 21 TAB) 10 MG (21) TBPK tablet Use as directed 21 tablet 0  . propranolol ER (INDERAL LA) 80 MG 24 hr capsule Take 1 capsule (80 mg total) by mouth daily. 90 capsule 3  . rosuvastatin (CRESTOR) 10 MG tablet Take 1 tablet (10 mg total) by mouth daily. 90 tablet 3  .  traMADol (ULTRAM) 50 MG tablet Take 1 tablet (50 mg total) by mouth every 12 (twelve) hours as needed for severe pain. 30 tablet 0  . budesonide-formoterol (SYMBICORT) 160-4.5 MCG/ACT inhaler INHALE 2 PUFFS TWO (2) TIMES A DAY. (Patient not taking: Reported on 01/08/2019) 30.6 Inhaler 1  . HUMIRA PEN 40 MG/0.4ML PNKT     . ranitidine (ZANTAC) 150 MG tablet Take 1 tablet (150 mg total) by mouth 2 (two) times daily. (Patient not taking: Reported on 01/08/2019) 60 tablet 4  . SPIRIVA RESPIMAT 2.5 MCG/ACT AERS 2 PUFFS BY MOUTH DAILY. (Patient not taking: Reported on 01/08/2019) 3 Inhaler 1   No current facility-administered medications for this visit.      PHYSICAL EXAMINATION: ECOG PERFORMANCE STATUS: 1 - Symptomatic but completely ambulatory Vitals:   01/08/19 1344 01/08/19 1404  BP: 136/90   Pulse: 94   Resp: 18   Temp: (!) 96.9 F (36.1 C)   SpO2:  96%   Filed Weights   01/08/19 1344  Weight: 219 lb (99.3 kg)    Physical Exam Constitutional:      General: She is not in acute distress.    Comments: obese  HENT:     Head: Normocephalic and atraumatic.  Eyes:     General: No scleral icterus.    Conjunctiva/sclera: Conjunctivae normal.     Pupils: Pupils are equal, round, and reactive to light.  Neck:     Musculoskeletal: Normal range of motion and neck supple.  Cardiovascular:     Rate and Rhythm: Normal rate and regular rhythm.     Heart sounds: Normal heart sounds.  Pulmonary:     Effort: Pulmonary effort is normal. No respiratory distress.     Breath sounds: No wheezing.     Comments: Decreased breath sound bilaterally Chest:     Chest wall: No tenderness.  Abdominal:     General: Bowel sounds are normal. There is no distension.     Palpations: Abdomen is soft. There is no mass.     Tenderness: There is no abdominal tenderness.  Musculoskeletal: Normal range of motion.        General: No  deformity.  Lymphadenopathy:     Cervical: No cervical adenopathy.  Skin:     General: Skin is warm and dry.     Findings: No erythema or rash.  Neurological:     Mental Status: She is alert and oriented to person, place, and time.     Cranial Nerves: No cranial nerve deficit.     Coordination: Coordination normal.  Psychiatric:        Behavior: Behavior normal.        Thought Content: Thought content normal.      LABORATORY DATA:  I have reviewed the data as listed Lab Results  Component Value Date   WBC 8.7 01/07/2019   HGB 12.7 01/07/2019   HCT 41.2 01/07/2019   MCV 87.1 01/07/2019   PLT 210 01/07/2019   Recent Labs    06/02/18 1052 10/12/18 1040  NA 135 139  K 3.8 4.2  CL 102 103  CO2 23 32  GLUCOSE 89 99  BUN 16 12  CREATININE 0.97 0.87  CALCIUM 9.5 9.2  GFRNONAA 58*  --   GFRAA >60  --   PROT 7.6 6.6  ALBUMIN 4.0 3.6  AST 37 17  ALT 15 13  ALKPHOS 92 72  BILITOT 0.5 0.5       ASSESSMENT & PLAN:  1. Iron deficiency anemia due to chronic blood loss   2. Chronic anticoagulation   3. B12 deficiency    #Iron deficiency Anemia:  Labs reviewed and discussed with patient.  Iron panel reviewed improved iron stores.  Hemoglobin at 12.7, improved from last visit. Advised patient to continue take oral ferrous sulfate 325 mg twice daily. Hold additional IV Venofer today.  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 Need life long parental B12 supplements.  Patient used to receive parenteral B12 via primary care physician's office.  Recommend patient to have monthly vitamin B12 1000 MCG injections  # History of small bowel AVMs.  Recommend patient to follow up with Dr.Eliott to discuss further plan for management of her AVMs.  Patient has not reestablished care yet.  Patient will want to defer at this point and see how her labs do in 4 months. #Chronic anticoagulation with Eliquis 5 mg twice daily.  Hemoglobin stable. All questions were answered. The patient knows to call the clinic with any problems questions or  concerns.  Orders Placed This Encounter  Procedures  . Iron and TIBC    Standing Status:   Future    Standing Expiration Date:   01/09/2020  . CBC with Differential/Platelet    Standing Status:   Future    Standing Expiration Date:   01/09/2020  . Ferritin    Standing Status:   Future    Standing Expiration Date:   01/09/2020  . Vitamin B12    Standing Status:   Future    Standing Expiration Date:   07/09/2019    Return of visit: 4 months    Earlie Server, MD, PhD Hematology Oncology [facility] at [facility] Pager- [phone] 01/08/2019

## 2019-01-08 NOTE — Progress Notes (Signed)
Pt here for follow up. She has a COPD flare and will be starting prednisone taper.

## 2019-01-17 ENCOUNTER — Other Ambulatory Visit: Payer: Self-pay | Admitting: Family

## 2019-01-17 DIAGNOSIS — R21 Rash and other nonspecific skin eruption: Secondary | ICD-10-CM

## 2019-01-18 ENCOUNTER — Ambulatory Visit: Payer: Medicare Other | Admitting: Family

## 2019-01-18 ENCOUNTER — Telehealth: Payer: Self-pay

## 2019-01-18 NOTE — Telephone Encounter (Signed)
Pt states she is no longer taking this medication.

## 2019-01-18 NOTE — Telephone Encounter (Signed)
LMTCB to ask if patient was still on medication since it was recalled.

## 2019-01-18 NOTE — Telephone Encounter (Signed)
Copied from [place] [id number]. Topic: Quick Communication - Appointment Cancellation >> Jan 18, 2019  8:26 AM Lennox Solders wrote: Patient called to cancel appointment scheduled for arnett. Patient  HAS TIR:[id number]} rescheduled their appointment. Pt is too sick  Route to department's PEC pool.

## 2019-01-25 ENCOUNTER — Encounter: Payer: Self-pay | Admitting: Pulmonary Disease

## 2019-01-25 ENCOUNTER — Ambulatory Visit
Admission: RE | Admit: 2019-01-25 | Discharge: 2019-01-25 | Disposition: A | Discharge status: Home / Self Care | Payer: Medicare Other | Source: Ambulatory Visit | Attending: Pulmonary Disease | Admitting: Pulmonary Disease

## 2019-01-25 ENCOUNTER — Ambulatory Visit (INDEPENDENT_AMBULATORY_CARE_PROVIDER_SITE_OTHER): Payer: Medicare Other | Admitting: Pulmonary Disease

## 2019-01-25 VITALS — BP 118/78 | HR 99 | Ht 66.5 in | Wt 217.2 lb

## 2019-01-25 DIAGNOSIS — J449 Chronic obstructive pulmonary disease, unspecified: Secondary | ICD-10-CM

## 2019-01-25 DIAGNOSIS — R05 Cough: Secondary | ICD-10-CM | POA: Diagnosis not present

## 2019-01-25 DIAGNOSIS — Z87891 Personal history of nicotine dependence: Secondary | ICD-10-CM

## 2019-01-25 DIAGNOSIS — J4489 Other specified chronic obstructive pulmonary disease: Secondary | ICD-10-CM

## 2019-01-25 NOTE — Progress Notes (Signed)
PULMONARY OFFICE FOLLOW-UP NOTE  Requesting MD/Service: Self Date of initial consultation: 11/04/17 Reason for consultation: COPD  PT PROFILE: 73 y.o. female former smoker (1 PPD X 45 yrs, quit 2012) previously seen by Dr Lake Bells for COPD  DATA: 03/11/14 CTA chest: No pulmonary emboli or acute abnormality 03/24/14 office spirometry: Consistent with severe obstruction.  FEV1 is 1.12 L (44%) 12/25/17 PFTs: Mild to moderate mixed restriction/obstruction.  FEV1 1.75 L (72%), FEV1/FVC 75%, TLC 4.20 L (80%), RV 1.87 L (89%), DLCO 10.0 (40%), DLCO/VA 89% predicted  INTERVAL: Last seen 12/17/2018. No major pulmonary events  SUBJ:  This is a scheduled follow-up.  Last visit, she was treated for mild COPD exacerbation.  She feels that she is back to her baseline.  She has no new complaints.  She continues to have mild exertional dyspnea but is more limited by musculoskeletal issues.  She denies CP, fever, purulent sputum, hemoptysis, LE edema and calf tenderness.  She is maintained on Symbicort and Spiriva.  She is using albuterol inhaler 0-1x/day    Vitals:   01/25/19 1137  BP: 118/78  Pulse: 99  SpO2: 98%  Weight: 217 lb 3.2 oz (98.5 kg)  Height: 5' 6.5" (1.689 m)  RA  EXAM:  Gen: NAD HEENT: NCAT, sclera white Neck: No JVD Lungs: breath sounds full, no wheezes or other adventitious sounds Cardiovascular: RRR, no murmurs Abdomen: Soft, nontender, normal BS Ext: without clubbing, cyanosis, edema Neuro: grossly intact Skin: Limited exam, no lesions noted   DATA:   BMP Latest Ref Rng & Units 10/12/2018 06/02/2018 08/06/2017  Glucose 70 - 99 mg/dL 99 89 114(H)  BUN 6 - 23 mg/dL 12 16 12   Creatinine 0.40 - 1.20 mg/dL 0.87 0.97 1.06  BUN/Creat Ratio 11 - 26 - - -  Sodium 135 - 145 mEq/L 139 135 136  Potassium 3.5 - 5.1 mEq/L 4.2 3.8 4.5  Chloride 96 - 112 mEq/L 103 102 100  CO2 19 - 32 mEq/L 32 23 31  Calcium 8.4 - 10.5 mg/dL 9.2 9.5 9.3    CBC Latest Ref Rng & Units 01/07/2019  11/02/2018 10/12/2018  WBC 4.0 - 10.5 K/uL 8.7 9.0 5.6  Hemoglobin 12.0 - 15.0 g/dL 12.7 10.9(L) 11.6(L)  Hematocrit 36.0 - 46.0 % 41.2 36.3 37.8  Platelets 150 - 400 K/uL 210 229 239.0    CXR: Today's film is normal  IMPRESSION:     ICD-10-CM   1. Chronic asthmatic bronchitis ([place]) J44.9 DG Chest 2 View  2. Former smoker Z87.891 DG Chest 2 View  Recent COPD exacerbation is resolved  For simplification (and potential cost) purposes, she wishes to switch Symbicort and Spiriva to Trelegy   PLAN:  Change Symbicort and Spiriva to Trelegy, 1 inhalation daily.  Rinse mouth after use Continue albuterol inhaler as needed for increased shortness of breath, wheezing, chest tightness, cough CXR ordered for today and reviewed above.  It is entirely normal.    Follow-up in 4-6 months.  She is to call sooner as needed   Merton Border, MD PCCM service Mobile [phone] Pager [phone] 01/25/2019 4:18 PM

## 2019-01-25 NOTE — Patient Instructions (Signed)
Continue Trelegy inhaler, 1 inhalation daily.  Rinse mouth after use Continue albuterol as needed for increased shortness of breath, wheezing, chest tightness, cough  Follow-up in 4-6 months.  Call sooner if needed

## 2019-01-26 ENCOUNTER — Telehealth: Payer: Self-pay

## 2019-01-26 NOTE — Telephone Encounter (Signed)
-----   Message from Wilhelmina Mcardle, MD sent at 01/25/2019  4:17 PM EST ----- Let her know CXR looks good. No findings... normal  Thanks  Waunita Schooner

## 2019-01-26 NOTE — Telephone Encounter (Signed)
Spoke to patient, let her know CXR looks good with no new findings, per Dr. Alva Garnet. Nothing further needed at this time.

## 2019-02-03 ENCOUNTER — Telehealth: Payer: Self-pay | Admitting: Pulmonary Disease

## 2019-02-03 NOTE — Telephone Encounter (Signed)
Pt developed sob/ chest tightness today with exertion.  Last office visit :Change Symbicort and Spiriva to Trelegy, 1 inhalation daily.  Rinse mouth after use Continue albuterol inhaler as needed for increased shortness of breath, wheezing, chest tightness, cough CXR ordered for today and reviewed above.  It is entirely normal.    Please advise:

## 2019-02-03 NOTE — Telephone Encounter (Signed)
Per Dr. Alva Garnet advice: He would like for patient to double up on Trelegy for the next three days. She is to call office on Friday if she in not feeling better. He will then call in short dose of steroid. Pt verbalized understanding  Nothing further needed.

## 2019-02-04 ENCOUNTER — Telehealth: Payer: Self-pay | Admitting: Cardiovascular Disease

## 2019-02-04 NOTE — Progress Notes (Signed)
Cardiology Office Note  Date:  02/05/2019   ID:  Brandy Armstrong 29-Jan-1946, MRN [id number]  PCP:  [name], FNP   Chief Complaint  Patient presents with  . other    Pt. c/o LE edema & shortness of breath. Meds reviewed by the pt. verbally.     HPI:  [name] is a 73 year old woman with past medical history of COPD, smoked quit 2012  Depression  paroxysmal Atrial fibrillation Normal EF >55% in 11/2016 at [facility] s/p ablation 2013 and 2016 and had recurrence. No further bleeding on apixaban.  Previously seen by cardiology at [facility], Last seen February 2018 Who presents for follow-up of her permanent atrial fibrillation  In follow-up today she reports that she has had recent trouble with shortness of breath and leg swelling High fluid intake, lots of water She did take Lasix x1 with improvement of her symptoms  Lab work reviewed with her Anemia better, HBG 12.7 On iron infusion Normal creatinine  Checks BP at Home 115/85 Was previously on metoprolol, changed to propranolol for tremor  Echocardiogram preserved ejection fraction, 10/2016 Results discussed with her, grossly normal with no estimation of right heart pressures  EKG personally reviewed by myself on todays visit Shows atrial fibrillation ventricular rate 93 bpm nonspecific ST abnormality  Other past medical history reviewed  AF ablation by Dr. Isaias Sakai in early 2013  recurrence when sotalol was stopped.  repeat procedure on 02/21/15.  required DCCV on 03/21/15.  Recurrence of her atrial fibrillation and change to amiodarone gastrointestinal bleeding and xarelto was stopped and aspirin started.  She has since been put on apixaban DCCV 08/2016 for recurrent AF with restoration of sinus rhythm. recurrence of AF about a month later. amiodarone has been discontinued  Previous discussions concerning rate-control, increased dose of amiodarone, repeat catheter ablation, hybrid ablation, and pacemaker  placement with AV node ablation.  Stress test 2015, low risk  CT ABD 2015 ABD athero   PMH:   has a past medical history of Atrial fibrillation ([place]), Barretts esophagus, Chest pain, Colon polyps, COPD (chronic obstructive pulmonary disease) ([place]), COPD (chronic obstructive pulmonary disease) ([place]), Depression with anxiety, GERD (gastroesophageal reflux disease), Neuropathy, Rectal fistula, Rheumatoid arthritis ([place]), Urinary incontinence, and Vitamin D deficiency.  PSH:    Past Surgical History:  Procedure Laterality Date  . ABDOMINAL HYSTERECTOMY  1992  . CARDIAC ELECTROPHYSIOLOGY STUDY AND ABLATION  2013  . CHOLECYSTECTOMY  1987  . COLONOSCOPY N/A 05/08/2015   Procedure: COLONOSCOPY;  Surgeon: Manya Silvas, MD;  Location[facility] ENDOSCOPY;  Service: Endoscopy;  Laterality: N/A;  . ESOPHAGOGASTRODUODENOSCOPY N/A 05/06/2015   Procedure: ESOPHAGOGASTRODUODENOSCOPY (EGD);  Surgeon: Lollie Sails, MD;  Location: [facility] ENDOSCOPY;  Service: Endoscopy;  Laterality: N/A;  . OOPHORECTOMY    . oophrectomy Bilateral 1992    Current Outpatient Medications  Medication Sig Dispense Refill  . albuterol (PROVENTIL HFA;VENTOLIN HFA) 108 (90 Base) MCG/ACT inhaler Inhale 1-2 puffs into the lungs every 4 (four) hours as needed for wheezing or shortness of breath. 1 Inhaler 10  . cyclobenzaprine (FLEXERIL) 5 MG tablet Take 1 tablet (5 mg total) by mouth at bedtime. (Patient taking differently: Take 5 mg by mouth at bedtime as needed. ) 30 tablet 1  . ELIQUIS 5 MG TABS tablet Take 1 tablet (5 mg total) by mouth 2 (two) times daily. 180 tablet 3  . esomeprazole (NEXIUM) 20 MG capsule Take 20 mg by mouth daily at 12 noon.    [name]  Fluticasone-Umeclidin-Vilant (TRELEGY ELLIPTA) 100-62.5-25 MCG/INH AEPB Inhale 1 puff into the lungs daily. 60 each 2  . furosemide (LASIX) 20 MG tablet Take 1 tablet (20 mg total) by mouth daily as needed. 90 tablet 2  . gabapentin (NEURONTIN) 600 MG tablet Take 1 tablet (600 mg  total) by mouth 2 (two) times daily. 120 tablet 2  . HUMIRA PEN 40 MG/0.4ML PNKT     . hydroxychloroquine (PLAQUENIL) 200 MG tablet Take 200 mg by mouth.    [name] ibuprofen (ADVIL,MOTRIN) 400 MG tablet Take 400 mg by mouth every 4 (four) hours as needed.     . lidocaine (XYLOCAINE) 5 % ointment Apply 1 application topically as needed.    . predniSONE (STERAPRED UNI-PAK 21 TAB) 10 MG (21) TBPK tablet Use as directed 21 tablet 0  . propranolol ER (INDERAL LA) 80 MG 24 hr capsule Take 1 capsule (80 mg total) by mouth daily. 90 capsule 3  . rosuvastatin (CRESTOR) 10 MG tablet Take 1 tablet (10 mg total) by mouth daily. 90 tablet 3  . traMADol (ULTRAM) 50 MG tablet Take 1 tablet (50 mg total) by mouth every 12 (twelve) hours as needed for severe pain. 30 tablet 0  . potassium chloride (K-DUR) 10 MEQ tablet Take 1 tablet (10 mEq total) by mouth daily as needed. 90 tablet 2   No current facility-administered medications for this visit.      Allergies:   Latex   Social History:  The patient  reports that she quit smoking about 7 years ago. She has a 40.00 pack-year smoking history. She has never used smokeless tobacco. She reports previous alcohol use. She reports that she does not use drugs.   Family History:   family history includes Breast cancer in her cousin; Breast cancer (age of onset: 44) in her sister; Colon cancer in her father; Depression in her mother; Diabetes in her brother; Pancreatic cancer in her mother.    Review of Systems: Review of Systems  Constitutional: Negative.   Respiratory: Positive for shortness of breath.   Cardiovascular: Negative.   Gastrointestinal: Negative.   Musculoskeletal: Negative.   Neurological: Negative.   Psychiatric/Behavioral: Negative.   All other systems reviewed and are negative.    PHYSICAL EXAM: VS:  BP 100/62 (BP Location: Left Arm, Patient Position: Sitting, Cuff Size: Normal)   Pulse 93   Ht 5' 6.5" (1.689 m)   Wt 220 lb 4 oz (99.9 kg)    BMI 35.02 kg/m  , BMI Body mass index is 35.02 kg/m. Constitutional:  oriented to person, place, and time. No distress.  HENT:  Head: Grossly normal Eyes:  no discharge. No scleral icterus.  Neck: No JVD, no carotid bruits  Cardiovascular: Regular rate and rhythm, no murmurs appreciated Pulmonary/Chest: Moderately decreased breath sounds, worse at the bases Abdominal: Soft.  no distension.  no tenderness.  Musculoskeletal: Normal range of motion Neurological:  normal muscle tone. Coordination normal. No atrophy Skin: Skin warm and dry Psychiatric: normal affect, pleasant  Recent Labs: 03/23/2018: TSH 0.53 10/12/2018: ALT 13; BUN 12; Creatinine, Ser 0.87; Potassium 4.2; Sodium 139 01/07/2019: Hemoglobin 12.7; Platelets 210    Lipid Panel Lab Results  Component Value Date   CHOL 155 10/12/2018   HDL 38.90 (L) 10/12/2018   LDLCALC 80 10/12/2018   TRIG 179.0 (H) 10/12/2018      Wt Readings from Last 3 Encounters:  02/05/19 220 lb 4 oz (99.9 kg)  01/25/19 217 lb 3.2 oz (98.5 kg)  01/08/19 219  lb (99.3 kg)       ASSESSMENT AND PLAN:  Permanent atrial fibrillation (HCC) - Plan: EKG 12-Lead Off all antiarrhythmics Previously managed by [facility] Discussed atrial fibrillation and risk of diastolic heart failure  Chronic diastolic CHF Suggested she start to take Lasix periodically for leg swelling, abdominal bloating and shortness of breath High risk of pulmonary hypertension and diastolic heart failure especially in the setting of atrial fibrillation She has normal renal function, normal electrolytes We did recommend she take potassium 10 when she takes Lasix  Atherosclerosis of aorta (HCC) - Plan: EKG 12-Lead Cholesterol close to goal Recommended low carbohydrate diet  Other emphysema (HCC) Long smoking history, currently on inhalers Recommended weight loss Followed by Dr. Alva Garnet, having worsening shortness of breath  GI bleed due to NSAIDs Denies any GI bleed on  eliquis Avoid NSAIDs  Tremors of nervous system Although blood pressure low, no symptoms Continue propranolol for now Recommend if she has more dizziness that she call our office  Depression, recurrent ([place]) Stable, recommended regular walking program  Shortness of breath COPD, obesity, deconditioning, likely chronic diastolic CHF in the setting of atrial fibrillation Recommended she take Lasix sparingly when she has significant symptoms If symptoms get worse may need to take Lasix with potassium daily  Disposition:   F/U  6 months  Long discussion with her concerning pulmonary hypertension, COPD, chronic diastolic CHF, estimate of diuretics depending on her fluid intake, salt intake, CHF education started  Total encounter time more than 45 minutes  Greater than 50% was spent in counseling and coordination of care with the patient    Orders Placed This Encounter  Procedures  . EKG 12-Lead     Signed, Esmond Plants, M.D., Ph.D. 02/05/2019  [place], [place]

## 2019-02-04 NOTE — Telephone Encounter (Signed)
Pt c/o swelling: STAT is pt has developed SOB within 24 hours  1) How much weight have you gained and in what time span?   2) If swelling, where is the swelling located? Swelling in ankles   3) Are you currently taking a fluid pill? Took one this morning - didn't seem to help  4) Are you currently SOB? Yes  5) Do you have a log of your daily weights (if so, list)?   6) Have you gained 3 pounds in a day or 5 pounds in a week?   7) Have you traveled recently? no   Patient has been noticing swelling in ankles.  Would like to know if she should schedule an appointment to come in sooner than recall.  Please call to discuss.

## 2019-02-04 NOTE — Telephone Encounter (Signed)
Patient complaint of leg swelling.  Started a few days ago.  She took lasix about 2 days ago and then again today. Does not feel it is helping. Encouraged her to elevated her feet.  She does not keep track of her daily weights.  Patient does not keep track of her BP/HR either and cannot report any numbers to me.  Educated her that it is good to track both weight and BP/HR readings.  She has not been seen by [place] since May 2019.  Dr Rockey Situ had opening tomorrow and patient agreeable to come.

## 2019-02-05 ENCOUNTER — Ambulatory Visit (INDEPENDENT_AMBULATORY_CARE_PROVIDER_SITE_OTHER): Payer: Medicare Other | Admitting: Cardiovascular Disease

## 2019-02-05 ENCOUNTER — Encounter: Payer: Self-pay | Admitting: Cardiovascular Disease

## 2019-02-05 VITALS — BP 100/62 | HR 93 | Ht 66.5 in | Wt 220.2 lb

## 2019-02-05 DIAGNOSIS — R0602 Shortness of breath: Secondary | ICD-10-CM

## 2019-02-05 DIAGNOSIS — I700 Atherosclerosis of aorta: Secondary | ICD-10-CM | POA: Diagnosis not present

## 2019-02-05 DIAGNOSIS — I480 Paroxysmal atrial fibrillation: Secondary | ICD-10-CM | POA: Diagnosis not present

## 2019-02-05 DIAGNOSIS — J438 Other emphysema: Secondary | ICD-10-CM

## 2019-02-05 DIAGNOSIS — I714 Abdominal aortic aneurysm, without rupture, unspecified: Secondary | ICD-10-CM

## 2019-02-05 DIAGNOSIS — R5382 Chronic fatigue, unspecified: Secondary | ICD-10-CM

## 2019-02-05 DIAGNOSIS — I739 Peripheral vascular disease, unspecified: Secondary | ICD-10-CM

## 2019-02-05 MED ORDER — FUROSEMIDE 20 MG PO TABS: 20.0000 mg | ORAL_TABLET | Freq: Every day | ORAL | 2 refills | Status: DC | PRN

## 2019-02-05 MED ORDER — POTASSIUM CHLORIDE ER 10 MEQ PO TBCR: 10.0000 meq | EXTENDED_RELEASE_TABLET | Freq: Every day | ORAL | 2 refills | Status: DC | PRN

## 2019-02-05 NOTE — Patient Instructions (Addendum)
Medication Instructions:  Take lasix with potassium as needed for leg swelling, shortness of breath, weight gain  If you need a refill on your cardiac medications before your next appointment, please call your pharmacy.    Lab work: No new labs needed   If you have labs (blood work) drawn today and your tests are completely normal, you will receive your results only by: [name] MyChart Message (if you have MyChart) OR . A paper copy in the mail If you have any lab test that is abnormal or we need to change your treatment, we will call you to review the results.   Testing/Procedures: No new testing needed   Follow-Up: At [facility], you and your health needs are our priority.  As part of our continuing mission to provide you with exceptional heart care, we have created designated Provider Care Teams.  These Care Teams include your primary Cardiologist (physician) and Advanced Practice Providers (APPs -  Physician Assistants and Nurse Practitioners) who all work together to provide you with the care you need, when you need it.  . You will need a follow up appointment in 6 months .   Please call our office 2 months in advance to schedule this appointment.    . Providers on your designated Care Team:   . [name], NP . Christell Faith, PA-C . Marrianne Mood, PA-C  Any Other Special Instructions Will Be Listed Below (If Applicable).  For educational health videos Log in to : [email or web address] Or : [email or web address], password : [email or web address]

## 2019-02-08 ENCOUNTER — Telehealth: Payer: Self-pay

## 2019-02-08 NOTE — Telephone Encounter (Signed)
Spoke to patient in regards to her medication. She stated that she feels as if she is doing well, but still having some issues. Questioned whether she has taken the Trelegy BID for 3 days like Dr. Alva Garnet had suggested. She stated she had not, she was scared to do so. Advised her it is okay to do for the 3 days like he suggested, but not for an extended period of time unless notified to do so by Dr. Alva Garnet. She is going to try that and see how she feels. She will call us if she does not feel better and we will work her in for an acute visit. Nothing further needed at this time.

## 2019-02-10 ENCOUNTER — Encounter: Payer: Self-pay | Admitting: Family

## 2019-02-10 ENCOUNTER — Ambulatory Visit (INDEPENDENT_AMBULATORY_CARE_PROVIDER_SITE_OTHER): Payer: Medicare Other | Admitting: Family

## 2019-02-10 ENCOUNTER — Ambulatory Visit: Payer: Medicare Other | Admitting: Family

## 2019-02-10 VITALS — BP 112/78 | HR 97 | Temp 97.9°F | Wt 221.2 lb

## 2019-02-10 DIAGNOSIS — G629 Polyneuropathy, unspecified: Secondary | ICD-10-CM | POA: Diagnosis not present

## 2019-02-10 DIAGNOSIS — J438 Other emphysema: Secondary | ICD-10-CM | POA: Diagnosis not present

## 2019-02-10 DIAGNOSIS — I5032 Chronic diastolic (congestive) heart failure: Secondary | ICD-10-CM | POA: Insufficient documentation

## 2019-02-10 MED ORDER — GABAPENTIN 300 MG PO CAPS: 300.0000 mg | ORAL_CAPSULE | Freq: Three times a day (TID) | ORAL | 3 refills | Status: DC

## 2019-02-10 NOTE — Patient Instructions (Addendum)
Start OTC Salon Pas pain patch. It is not sedating.  This Old Goat is great product. Topical therapies for back pain are FAR safer.   HEAT HEAT HEAT.   Decrease gabapentin to 300mg  three times per day.    Stop flexeril for now.   Close vigilance and let me know of any new concerns    Today we discussed referrals, orders. Physical therapy   I have placed these orders in the system for you.  Please be sure to give [place] a call if you have not heard from our office regarding this. We should hear from [place] within ONE week with information regarding your appointment. If not, please let me know immediately.

## 2019-02-10 NOTE — Progress Notes (Signed)
Subjective:    Patient ID: Brandy Armstrong, female    DOB: [birth year], 72 y.o.   MRN: [id number]  CC: Brandy Armstrong is a 73 y.o. female who presents today for follow up.   HPI: Feels frustrated that cannot go anywhere.   Feels out of balance. Has made an appointment with an orthopedic for low back pain and right knee pain. Bilateral feet neuropathy. Uses voltaren gel. No dizziness, syncope, falls.   LE edema- chronic.  havent really been taken lasix as didn't know when to take.    Weighing herself regularly 217 lb, today was 219. No pain calves.   SOB- has improved overall. Cough greatly improved. Started trelegy in 2020.   Gollan - 05/02/6643 Chronic diastolic CHF- advised lasix prn with Kcl 10 meq SOB- copd, obesity, deconditioning, lasix prn  COPD - increased trelegy for 3 days on 02/03/2019, short course steriod. ;ast visit 11/2018 .  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 On plaquenil, humira.   on eliquis   HISTORY:  Past Medical History:  Diagnosis Date  . Atrial fibrillation [facility])    a. s/p ablation 01/2012 in [facility] by Dr. Boyd Kerbs;  b. On sotalol & Xarelto;  c. 02/2014 Echo: EF 50-55%, mild conc LVH, nl LA size/structure;  d. Recurrent afib 8/15 & 08/29/2014.  . Barretts esophagus   . Chest pain    a. 02/2014 Myoview: Ef 50%, no ischemia.  . Colon polyps   . COPD (chronic obstructive pulmonary disease) ([place])   . COPD (chronic obstructive pulmonary disease) ([place])   . Depression with anxiety   . GERD (gastroesophageal reflux disease)   . Neuropathy   . Rectal fistula   . Rheumatoid arthritis ([place])    On methotrexate and orencia  . Urinary incontinence   . Vitamin D deficiency    Past Surgical History:  Procedure Laterality Date  . ABDOMINAL HYSTERECTOMY  1992  . CARDIAC ELECTROPHYSIOLOGY STUDY AND ABLATION  2013  . CHOLECYSTECTOMY  1987  . COLONOSCOPY N/A 05/08/2015   Procedure: COLONOSCOPY;  Surgeon: Manya Silvas, MD;  Location: [facility]  ENDOSCOPY;  Service: Endoscopy;  Laterality: N/A;  . ESOPHAGOGASTRODUODENOSCOPY N/A 05/06/2015   Procedure: ESOPHAGOGASTRODUODENOSCOPY (EGD);  Surgeon: Lollie Sails, MD;  Location: [facility] ENDOSCOPY;  Service: Endoscopy;  Laterality: N/A;  . OOPHORECTOMY    . oophrectomy Bilateral 1992   Family History  Problem Relation Age of Onset  . Depression Mother   . Pancreatic cancer Mother   . Colon cancer Father   . Breast cancer Sister 84  . Diabetes Brother   . Breast cancer Cousin   . Neuropathy Neg Hx     Allergies: Latex Current Outpatient Medications on File Prior to Visit  Medication Sig Dispense Refill  . albuterol (PROVENTIL HFA;VENTOLIN HFA) 108 (90 Base) MCG/ACT inhaler Inhale 1-2 puffs into the lungs every 4 (four) hours as needed for wheezing or shortness of breath. 1 Inhaler 10  . ELIQUIS 5 MG TABS tablet Take 1 tablet (5 mg total) by mouth 2 (two) times daily. 180 tablet 3  . esomeprazole (NEXIUM) 20 MG capsule Take 20 mg by mouth daily at 12 noon.    . Fluticasone-Umeclidin-Vilant (TRELEGY ELLIPTA) 100-62.5-25 MCG/INH AEPB Inhale 1 puff into the lungs daily. 60 each 2  . furosemide (LASIX) 20 MG tablet Take 1 tablet (20 mg total) by mouth daily as needed. 90 tablet 2  . HUMIRA PEN 40 MG/0.4ML PNKT     .  hydroxychloroquine (PLAQUENIL) 200 MG tablet Take 200 mg by mouth.    [name] ibuprofen (ADVIL,MOTRIN) 400 MG tablet Take 400 mg by mouth every 4 (four) hours as needed.     . lidocaine (XYLOCAINE) 5 % ointment Apply 1 application topically as needed.    . potassium chloride (K-DUR) 10 MEQ tablet Take 1 tablet (10 mEq total) by mouth daily as needed. 90 tablet 2  . propranolol ER (INDERAL LA) 80 MG 24 hr capsule Take 1 capsule (80 mg total) by mouth daily. 90 capsule 3  . rosuvastatin (CRESTOR) 10 MG tablet Take 1 tablet (10 mg total) by mouth daily. 90 tablet 3  . traMADol (ULTRAM) 50 MG tablet Take 1 tablet (50 mg total) by mouth every 12 (twelve) hours as needed for severe pain.  30 tablet 0  . predniSONE (STERAPRED UNI-PAK 21 TAB) 10 MG (21) TBPK tablet Use as directed (Patient not taking: Reported on 02/10/2019) 21 tablet 0   No current facility-administered medications on file prior to visit.     Social History   Tobacco Use  . Smoking status: Former Smoker    Packs/day: 1.00    Years: 40.00    Pack years: 40.00    Last attempt to quit: 12/16/2011    Years since quitting: 7.1  . Smokeless tobacco: Never Used  Substance Use Topics  . Alcohol use: Not Currently    Alcohol/week: 0.0 standard drinks    Comment: Occasionally has a drink  . Drug use: No    Review of Systems  Constitutional: Negative for chills and fever.  Respiratory: Positive for shortness of breath. Negative for cough.   Cardiovascular: Positive for leg swelling. Negative for chest pain and palpitations.  Gastrointestinal: Negative for nausea and vomiting.  Musculoskeletal: Positive for arthralgias and back pain.  Neurological: Positive for numbness. Negative for dizziness and headaches.  Psychiatric/Behavioral: Negative for suicidal ideas.      Objective:    BP 112/78 (BP Location: Left Arm, Patient Position: Sitting, Cuff Size: Large)   Pulse 97   Temp 97.9 F (36.6 C)   Wt 221 lb 3.2 oz (100.3 kg)   SpO2 97%   BMI 35.17 kg/m  BP Readings from Last 3 Encounters:  02/10/19 112/78  02/05/19 100/62  01/25/19 118/78   Wt Readings from Last 3 Encounters:  02/10/19 221 lb 3.2 oz (100.3 kg)  02/05/19 220 lb 4 oz (99.9 kg)  01/25/19 217 lb 3.2 oz (98.5 kg)    Physical Exam Vitals signs reviewed.  Constitutional:      Appearance: She is well-developed.  Eyes:     Conjunctiva/sclera: Conjunctivae normal.  Cardiovascular:     Rate and Rhythm: Normal rate and regular rhythm.     Pulses: Normal pulses.     Heart sounds: Normal heart sounds.     Comments: Trace bilateral ankle edema.   Pulmonary:     Effort: Pulmonary effort is normal.     Breath sounds: Normal breath  sounds. No wheezing, rhonchi or rales.  Skin:    General: Skin is warm and dry.  Neurological:     Mental Status: She is alert.     Comments: Wide based gait.   Psychiatric:        Speech: Speech normal.        Behavior: Behavior normal.        Thought Content: Thought content normal.        Assessment & Plan:   Problem List Items Addressed This Visit  Cardiovascular and Mediastinum   Chronic diastolic heart failure ([place])    2 pound weight gain. No adventitious lung sounds. Advised dose of lasix WITH KCl today, which she understood. Advised to use daily weights and more than 2 lb weight gain as objective reason to take lasix. Reviewed Gollan's note.         Respiratory   COPD (chronic obstructive pulmonary disease) (HCC)    Symptoms slightly improved to baseline. Will follow.         Nervous and Auditory   Neuropathy - Primary    Lack of balance multifactorial due to neuropathy, deconditioning, polypharmacy ( decreased gabapentin, dced flexeril), arthritic pain. Will pursue PT for balance. Rx walker for safety. Close follow up.       Relevant Medications   gabapentin (NEURONTIN) 300 MG capsule   Other Relevant Orders   DME Other see comment   Ambulatory referral to Physical Therapy       I have discontinued Noelene C. Walby "Nyah Gladu"'s cyclobenzaprine and gabapentin. I am also having her start on gabapentin. Additionally, I am having her maintain her hydroxychloroquine, lidocaine, albuterol, ELIQUIS, propranolol ER, HUMIRA PEN, ibuprofen, rosuvastatin, traMADol, esomeprazole, predniSONE, Fluticasone-Umeclidin-Vilant, furosemide, and potassium chloride.   Meds ordered this encounter  Medications  . gabapentin (NEURONTIN) 300 MG capsule    Sig: Take 1 capsule (300 mg total) by mouth 3 (three) times daily.    Dispense:  90 capsule    Refill:  3    Order Specific Question:   Supervising Provider    Answer:   Crecencio Mc [[id number]]    Return precautions  given.   Risks, benefits, and alternatives of the medications and treatment plan prescribed today were discussed, and patient expressed understanding.   Education regarding symptom management and diagnosis given to patient on AVS.  Continue to follow with [name], FNP for routine health maintenance.   [name] and I agreed with plan.   Mable Paris, FNP

## 2019-02-10 NOTE — Assessment & Plan Note (Addendum)
2 pound weight gain. No adventitious lung sounds. Advised dose of lasix WITH KCl today, which she understood. Advised to use daily weights and more than 2 lb weight gain as objective reason to take lasix. Reviewed Gollan's note.

## 2019-02-10 NOTE — Assessment & Plan Note (Signed)
Symptoms slightly improved to baseline. Will follow.

## 2019-02-10 NOTE — Assessment & Plan Note (Addendum)
Lack of balance multifactorial due to neuropathy, deconditioning, polypharmacy ( decreased gabapentin, dced flexeril), arthritic pain. Will pursue PT for balance. Rx walker for safety. Close follow up.

## 2019-02-15 ENCOUNTER — Telehealth: Payer: Self-pay

## 2019-02-15 NOTE — Telephone Encounter (Signed)
Copied from [place] [id number]. Topic: General - Other >> Feb 15, 2019 11:51 AM Yvette Rack wrote: Reason for CRM: Pt stated [place] needs the diagnosis for the Rx for the walker. Pt requests that the diagnosis be faxed to [phone]

## 2019-02-17 NOTE — Telephone Encounter (Signed)
I have printed last OV notes with ICD 10 codes & sent to [facility].

## 2019-02-17 NOTE — Telephone Encounter (Signed)
Call advanced Home care.  Diagnosis include COPD, neuropathy, rheumatoid arthritis, chronic back pain, fall risk.

## 2019-02-19 ENCOUNTER — Inpatient Hospital Stay: Payer: Medicare Other | Admitting: Anesthesiology

## 2019-02-19 ENCOUNTER — Inpatient Hospital Stay: Admit: 2019-02-19 | Payer: Medicare Other

## 2019-02-19 ENCOUNTER — Inpatient Hospital Stay: Payer: Medicare Other

## 2019-02-19 ENCOUNTER — Inpatient Hospital Stay: Admit: 2019-02-19 | Payer: Medicare Other | Admitting: General Surgery

## 2019-02-19 ENCOUNTER — Emergency Department: Payer: Medicare Other

## 2019-02-19 ENCOUNTER — Encounter
Admission: EM | Disposition: A | Discharge status: Skilled Nursing Facility | Payer: Self-pay | Source: Home / Self Care | Attending: General Surgery

## 2019-02-19 ENCOUNTER — Other Ambulatory Visit: Payer: Self-pay | Admitting: Family

## 2019-02-19 ENCOUNTER — Inpatient Hospital Stay
Admission: EM | Admit: 2019-02-19 | Discharge: 2019-03-08 | DRG: 853 | Disposition: A | Discharge status: Skilled Nursing Facility | Payer: Medicare Other | Attending: General Surgery | Admitting: General Surgery

## 2019-02-19 DIAGNOSIS — R1084 Generalized abdominal pain: Secondary | ICD-10-CM | POA: Diagnosis not present

## 2019-02-19 DIAGNOSIS — Z800 Family history of malignant neoplasm of digestive organs: Secondary | ICD-10-CM

## 2019-02-19 DIAGNOSIS — M069 Rheumatoid arthritis, unspecified: Secondary | ICD-10-CM | POA: Diagnosis present

## 2019-02-19 DIAGNOSIS — K353 Acute appendicitis with localized peritonitis, without perforation or gangrene: Secondary | ICD-10-CM | POA: Diagnosis present

## 2019-02-19 DIAGNOSIS — R918 Other nonspecific abnormal finding of lung field: Secondary | ICD-10-CM | POA: Diagnosis not present

## 2019-02-19 DIAGNOSIS — M5489 Other dorsalgia: Secondary | ICD-10-CM | POA: Diagnosis not present

## 2019-02-19 DIAGNOSIS — Z85038 Personal history of other malignant neoplasm of large intestine: Secondary | ICD-10-CM | POA: Diagnosis not present

## 2019-02-19 DIAGNOSIS — M1711 Unilateral primary osteoarthritis, right knee: Secondary | ICD-10-CM | POA: Diagnosis not present

## 2019-02-19 DIAGNOSIS — Z79899 Other long term (current) drug therapy: Secondary | ICD-10-CM

## 2019-02-19 DIAGNOSIS — E559 Vitamin D deficiency, unspecified: Secondary | ICD-10-CM | POA: Diagnosis present

## 2019-02-19 DIAGNOSIS — J441 Chronic obstructive pulmonary disease with (acute) exacerbation: Secondary | ICD-10-CM | POA: Diagnosis not present

## 2019-02-19 DIAGNOSIS — E876 Hypokalemia: Secondary | ICD-10-CM | POA: Diagnosis present

## 2019-02-19 DIAGNOSIS — G9340 Encephalopathy, unspecified: Secondary | ICD-10-CM | POA: Diagnosis not present

## 2019-02-19 DIAGNOSIS — G8918 Other acute postprocedural pain: Secondary | ICD-10-CM | POA: Diagnosis not present

## 2019-02-19 DIAGNOSIS — G9341 Metabolic encephalopathy: Secondary | ICD-10-CM | POA: Diagnosis not present

## 2019-02-19 DIAGNOSIS — J9690 Respiratory failure, unspecified, unspecified whether with hypoxia or hypercapnia: Secondary | ICD-10-CM | POA: Diagnosis not present

## 2019-02-19 DIAGNOSIS — R6521 Severe sepsis with septic shock: Secondary | ICD-10-CM | POA: Diagnosis present

## 2019-02-19 DIAGNOSIS — Z6835 Body mass index (BMI) 35.0-35.9, adult: Secondary | ICD-10-CM

## 2019-02-19 DIAGNOSIS — A419 Sepsis, unspecified organism: Principal | ICD-10-CM | POA: Diagnosis present

## 2019-02-19 DIAGNOSIS — R197 Diarrhea, unspecified: Secondary | ICD-10-CM | POA: Diagnosis not present

## 2019-02-19 DIAGNOSIS — J9811 Atelectasis: Secondary | ICD-10-CM | POA: Diagnosis present

## 2019-02-19 DIAGNOSIS — I110 Hypertensive heart disease with heart failure: Secondary | ICD-10-CM | POA: Diagnosis present

## 2019-02-19 DIAGNOSIS — D649 Anemia, unspecified: Secondary | ICD-10-CM | POA: Diagnosis present

## 2019-02-19 DIAGNOSIS — F419 Anxiety disorder, unspecified: Secondary | ICD-10-CM | POA: Diagnosis present

## 2019-02-19 DIAGNOSIS — E669 Obesity, unspecified: Secondary | ICD-10-CM | POA: Diagnosis present

## 2019-02-19 DIAGNOSIS — Z791 Long term (current) use of non-steroidal anti-inflammatories (NSAID): Secondary | ICD-10-CM

## 2019-02-19 DIAGNOSIS — I700 Atherosclerosis of aorta: Secondary | ICD-10-CM | POA: Diagnosis present

## 2019-02-19 DIAGNOSIS — M2550 Pain in unspecified joint: Secondary | ICD-10-CM | POA: Diagnosis not present

## 2019-02-19 DIAGNOSIS — Z87891 Personal history of nicotine dependence: Secondary | ICD-10-CM

## 2019-02-19 DIAGNOSIS — I482 Chronic atrial fibrillation, unspecified: Secondary | ICD-10-CM | POA: Diagnosis not present

## 2019-02-19 DIAGNOSIS — Z8719 Personal history of other diseases of the digestive system: Secondary | ICD-10-CM | POA: Diagnosis not present

## 2019-02-19 DIAGNOSIS — I5032 Chronic diastolic (congestive) heart failure: Secondary | ICD-10-CM | POA: Diagnosis not present

## 2019-02-19 DIAGNOSIS — Z86010 Personal history of colonic polyps: Secondary | ICD-10-CM | POA: Diagnosis not present

## 2019-02-19 DIAGNOSIS — K352 Acute appendicitis with generalized peritonitis, without abscess: Secondary | ICD-10-CM | POA: Diagnosis not present

## 2019-02-19 DIAGNOSIS — Z5331 Laparoscopic surgical procedure converted to open procedure: Secondary | ICD-10-CM | POA: Diagnosis not present

## 2019-02-19 DIAGNOSIS — K3532 Acute appendicitis with perforation and localized peritonitis, without abscess: Secondary | ICD-10-CM | POA: Diagnosis present

## 2019-02-19 DIAGNOSIS — Z7901 Long term (current) use of anticoagulants: Secondary | ICD-10-CM

## 2019-02-19 DIAGNOSIS — K2270 Barrett's esophagus without dysplasia: Secondary | ICD-10-CM | POA: Diagnosis present

## 2019-02-19 DIAGNOSIS — K567 Ileus, unspecified: Secondary | ICD-10-CM | POA: Diagnosis not present

## 2019-02-19 DIAGNOSIS — R10817 Generalized abdominal tenderness: Secondary | ICD-10-CM | POA: Diagnosis not present

## 2019-02-19 DIAGNOSIS — F329 Major depressive disorder, single episode, unspecified: Secondary | ICD-10-CM | POA: Diagnosis present

## 2019-02-19 DIAGNOSIS — I10 Essential (primary) hypertension: Secondary | ICD-10-CM | POA: Diagnosis not present

## 2019-02-19 DIAGNOSIS — Z4682 Encounter for fitting and adjustment of non-vascular catheter: Secondary | ICD-10-CM | POA: Diagnosis not present

## 2019-02-19 DIAGNOSIS — Z7951 Long term (current) use of inhaled steroids: Secondary | ICD-10-CM | POA: Diagnosis not present

## 2019-02-19 DIAGNOSIS — Z0189 Encounter for other specified special examinations: Secondary | ICD-10-CM

## 2019-02-19 DIAGNOSIS — K219 Gastro-esophageal reflux disease without esophagitis: Secondary | ICD-10-CM | POA: Diagnosis present

## 2019-02-19 DIAGNOSIS — Z452 Encounter for adjustment and management of vascular access device: Secondary | ICD-10-CM | POA: Diagnosis not present

## 2019-02-19 DIAGNOSIS — R1310 Dysphagia, unspecified: Secondary | ICD-10-CM

## 2019-02-19 DIAGNOSIS — Z9049 Acquired absence of other specified parts of digestive tract: Secondary | ICD-10-CM | POA: Diagnosis not present

## 2019-02-19 DIAGNOSIS — Z48815 Encounter for surgical aftercare following surgery on the digestive system: Secondary | ICD-10-CM | POA: Diagnosis not present

## 2019-02-19 DIAGNOSIS — G629 Polyneuropathy, unspecified: Secondary | ICD-10-CM | POA: Diagnosis present

## 2019-02-19 DIAGNOSIS — J95821 Acute postprocedural respiratory failure: Secondary | ICD-10-CM | POA: Diagnosis not present

## 2019-02-19 DIAGNOSIS — Z91040 Latex allergy status: Secondary | ICD-10-CM

## 2019-02-19 DIAGNOSIS — Z90722 Acquired absence of ovaries, bilateral: Secondary | ICD-10-CM | POA: Diagnosis not present

## 2019-02-19 DIAGNOSIS — Z79891 Long term (current) use of opiate analgesic: Secondary | ICD-10-CM

## 2019-02-19 DIAGNOSIS — Z9911 Dependence on respirator [ventilator] status: Secondary | ICD-10-CM | POA: Diagnosis not present

## 2019-02-19 DIAGNOSIS — J449 Chronic obstructive pulmonary disease, unspecified: Secondary | ICD-10-CM | POA: Diagnosis not present

## 2019-02-19 DIAGNOSIS — F05 Delirium due to known physiological condition: Secondary | ICD-10-CM | POA: Diagnosis not present

## 2019-02-19 DIAGNOSIS — Z789 Other specified health status: Secondary | ICD-10-CM

## 2019-02-19 DIAGNOSIS — Z9089 Acquired absence of other organs: Secondary | ICD-10-CM | POA: Diagnosis not present

## 2019-02-19 DIAGNOSIS — R000 Tachycardia, unspecified: Secondary | ICD-10-CM | POA: Diagnosis not present

## 2019-02-19 DIAGNOSIS — I4821 Permanent atrial fibrillation: Secondary | ICD-10-CM | POA: Diagnosis not present

## 2019-02-19 DIAGNOSIS — Z818 Family history of other mental and behavioral disorders: Secondary | ICD-10-CM

## 2019-02-19 DIAGNOSIS — Z7401 Bed confinement status: Secondary | ICD-10-CM | POA: Diagnosis not present

## 2019-02-19 DIAGNOSIS — I4891 Unspecified atrial fibrillation: Secondary | ICD-10-CM

## 2019-02-19 DIAGNOSIS — Z803 Family history of malignant neoplasm of breast: Secondary | ICD-10-CM

## 2019-02-19 DIAGNOSIS — K37 Unspecified appendicitis: Secondary | ICD-10-CM | POA: Diagnosis not present

## 2019-02-19 DIAGNOSIS — K3580 Unspecified acute appendicitis: Secondary | ICD-10-CM

## 2019-02-19 DIAGNOSIS — Z833 Family history of diabetes mellitus: Secondary | ICD-10-CM

## 2019-02-19 DIAGNOSIS — J9601 Acute respiratory failure with hypoxia: Secondary | ICD-10-CM | POA: Diagnosis not present

## 2019-02-19 DIAGNOSIS — R339 Retention of urine, unspecified: Secondary | ICD-10-CM | POA: Diagnosis not present

## 2019-02-19 DIAGNOSIS — Z90710 Acquired absence of both cervix and uterus: Secondary | ICD-10-CM

## 2019-02-19 HISTORY — DX: Permanent atrial fibrillation: I48.21

## 2019-02-19 HISTORY — PX: LAPAROSCOPIC APPENDECTOMY: SHX408

## 2019-02-19 LAB — CBC WITH DIFFERENTIAL/PLATELET
Abs Immature Granulocytes: 0.03 10*3/uL (ref 0.00–0.07)
Basophils Absolute: 0 10*3/uL (ref 0.0–0.1)
Basophils Relative: 0 %
EOS ABS: 0.1 10*3/uL (ref 0.0–0.5)
Eosinophils Relative: 1 %
HCT: 39.9 % (ref 36.0–46.0)
Hemoglobin: 12.4 g/dL (ref 12.0–15.0)
Immature Granulocytes: 0 %
Lymphocytes Relative: 12 %
Lymphs Abs: 1.1 10*3/uL (ref 0.7–4.0)
MCH: 27.8 pg (ref 26.0–34.0)
MCHC: 31.1 g/dL (ref 30.0–36.0)
MCV: 89.5 fL (ref 80.0–100.0)
Monocytes Absolute: 0.6 10*3/uL (ref 0.1–1.0)
Monocytes Relative: 7 %
Neutro Abs: 7.1 10*3/uL (ref 1.7–7.7)
Neutrophils Relative %: 80 %
Platelets: 213 10*3/uL (ref 150–400)
RBC: 4.46 MIL/uL (ref 3.87–5.11)
RDW: 14.8 % (ref 11.5–15.5)
WBC: 9 10*3/uL (ref 4.0–10.5)
nRBC: 0 % (ref 0.0–0.2)

## 2019-02-19 LAB — APTT
APTT: 31 s (ref 24–36)
aPTT: 29 seconds (ref 24–36)

## 2019-02-19 LAB — BLOOD GAS, ARTERIAL
Acid-base deficit: 1.5 mmol/L (ref 0.0–2.0)
Bicarbonate: 24.8 mmol/L (ref 20.0–28.0)
FIO2: 50
MECHVT: 450 mL
Mechanical Rate: 15
O2 Saturation: 98.2 %
PEEP: 5 cmH2O
Patient temperature: 37
pCO2 arterial: 47 mmHg (ref 32.0–48.0)
pH, Arterial: 7.33 — ABNORMAL LOW (ref 7.350–7.450)
pO2, Arterial: 115 mmHg — ABNORMAL HIGH (ref 83.0–108.0)

## 2019-02-19 LAB — COMPREHENSIVE METABOLIC PANEL
ALT: 11 U/L (ref 0–44)
AST: 22 U/L (ref 15–41)
Albumin: 3.3 g/dL — ABNORMAL LOW (ref 3.5–5.0)
Alkaline Phosphatase: 68 U/L (ref 38–126)
Anion gap: 9 (ref 5–15)
BUN: 11 mg/dL (ref 8–23)
CO2: 27 mmol/L (ref 22–32)
Calcium: 8.8 mg/dL — ABNORMAL LOW (ref 8.9–10.3)
Chloride: 102 mmol/L (ref 98–111)
Creatinine, Ser: 0.8 mg/dL (ref 0.44–1.00)
GFR calc Af Amer: >60 mL/min (ref >60)
GFR calc non Af Amer: >60 mL/min (ref >60)
Glucose, Bld: 138 mg/dL — ABNORMAL HIGH (ref 70–99)
Potassium: 3.3 mmol/L — ABNORMAL LOW (ref 3.5–5.1)
Sodium: 138 mmol/L (ref 135–145)
TOTAL PROTEIN: 6.6 g/dL (ref 6.5–8.1)
Total Bilirubin: 0.7 mg/dL (ref 0.3–1.2)

## 2019-02-19 LAB — URINALYSIS, COMPLETE (UACMP) WITH MICROSCOPIC
Bacteria, UA: NONE SEEN
Bilirubin Urine: NEGATIVE
Glucose, UA: NEGATIVE mg/dL
Hgb urine dipstick: NEGATIVE
Ketones, ur: NEGATIVE mg/dL
Leukocytes,Ua: NEGATIVE
Nitrite: NEGATIVE
Protein, ur: NEGATIVE mg/dL
Specific Gravity, Urine: >1.046 — ABNORMAL HIGH (ref 1.005–1.030)
pH: 5 (ref 5.0–8.0)

## 2019-02-19 LAB — LIPASE, BLOOD: Lipase: 41 U/L (ref 11–51)

## 2019-02-19 LAB — TSH: TSH: 5.461 u[IU]/mL — ABNORMAL HIGH (ref 0.350–4.500)

## 2019-02-19 LAB — MAGNESIUM: Magnesium: 2 mg/dL (ref 1.7–2.4)

## 2019-02-19 LAB — HEPARIN LEVEL (UNFRACTIONATED): Heparin Unfractionated: 3.36 IU/mL — ABNORMAL HIGH (ref 0.30–0.70)

## 2019-02-19 SURGERY — APPENDECTOMY, LAPAROSCOPIC
Anesthesia: General

## 2019-02-19 MED ORDER — DILTIAZEM HCL-DEXTROSE 100-5 MG/100ML-% IV SOLN (PREMIX)
5.0000 mg/h | INTRAVENOUS | Status: DC
  Administered 2019-02-19 – 2019-02-20 (×2): 5 mg/h via INTRAVENOUS
  Filled 2019-02-19 (×2): qty 100

## 2019-02-19 MED ORDER — PROPRANOLOL HCL ER 80 MG PO CP24
80.0000 mg | ORAL_CAPSULE | Freq: Every day | ORAL | Status: DC
  Filled 2019-02-19: qty 1

## 2019-02-19 MED ORDER — PROMETHAZINE HCL 25 MG/ML IJ SOLN
12.5000 mg | Freq: Once | INTRAMUSCULAR | Status: AC
  Administered 2019-02-19: 12.5 mg via INTRAVENOUS
  Filled 2019-02-19: qty 1

## 2019-02-19 MED ORDER — SODIUM CHLORIDE 0.9 % IV SOLN
INTRAVENOUS | Status: DC | PRN
  Administered 2019-02-19: 100 ug/min via INTRAVENOUS

## 2019-02-19 MED ORDER — PROPOFOL 500 MG/50ML IV EMUL
INTRAVENOUS | Status: DC | PRN
  Administered 2019-02-19: 80 ug/kg/min via INTRAVENOUS

## 2019-02-19 MED ORDER — FAMOTIDINE IN NACL 20-0.9 MG/50ML-% IV SOLN
20.0000 mg | Freq: Two times a day (BID) | INTRAVENOUS | Status: DC
  Administered 2019-02-19 – 2019-02-28 (×19): 20 mg via INTRAVENOUS
  Filled 2019-02-19 (×19): qty 50

## 2019-02-19 MED ORDER — IOHEXOL 300 MG/ML  SOLN
100.0000 mL | Freq: Once | INTRAMUSCULAR | Status: AC | PRN
  Administered 2019-02-19: 100 mL via INTRAVENOUS

## 2019-02-19 MED ORDER — PROPOFOL 500 MG/50ML IV EMUL
INTRAVENOUS | Status: AC
  Filled 2019-02-19: qty 50

## 2019-02-19 MED ORDER — VASOPRESSIN 20 UNIT/ML IV SOLN
INTRAVENOUS | Status: AC
  Filled 2019-02-19: qty 1

## 2019-02-19 MED ORDER — POTASSIUM CHLORIDE CRYS ER 20 MEQ PO TBCR: 10.0000 meq | EXTENDED_RELEASE_TABLET | Freq: Every day | ORAL | Status: DC | PRN

## 2019-02-19 MED ORDER — ONDANSETRON HCL 4 MG/2ML IJ SOLN
4.0000 mg | Freq: Once | INTRAMUSCULAR | Status: AC
  Administered 2019-02-19: 4 mg via INTRAVENOUS
  Filled 2019-02-19: qty 2

## 2019-02-19 MED ORDER — HEPARIN (PORCINE) 25000 UT/250ML-% IV SOLN
1450.0000 [IU]/h | INTRAVENOUS | Status: DC
  Administered 2019-02-19: 1250 [IU]/h via INTRAVENOUS
  Administered 2019-02-20: 1450 [IU]/h via INTRAVENOUS
  Filled 2019-02-19 (×3): qty 250

## 2019-02-19 MED ORDER — PROPOFOL 1000 MG/100ML IV EMUL
5.0000 ug/kg/min | INTRAVENOUS | Status: DC
  Administered 2019-02-19: 50 ug/kg/min via INTRAVENOUS
  Administered 2019-02-20: 40 ug/kg/min via INTRAVENOUS
  Administered 2019-02-20: 33.234 ug/kg/min via INTRAVENOUS
  Administered 2019-02-20: 40 ug/kg/min via INTRAVENOUS
  Filled 2019-02-19 (×3): qty 100

## 2019-02-19 MED ORDER — VASOPRESSIN 20 UNIT/ML IV SOLN
INTRAVENOUS | Status: DC | PRN
  Administered 2019-02-19: 1 [IU] via INTRAVENOUS
  Administered 2019-02-19 (×2): .5 [IU] via INTRAVENOUS

## 2019-02-19 MED ORDER — POTASSIUM CHLORIDE 10 MEQ/100ML IV SOLN
10.0000 meq | INTRAVENOUS | Status: AC
  Filled 2019-02-19 (×2): qty 100

## 2019-02-19 MED ORDER — POTASSIUM CHLORIDE 10 MEQ/100ML IV SOLN
10.0000 meq | INTRAVENOUS | Status: AC
  Administered 2019-02-19 (×2): 10 meq via INTRAVENOUS
  Filled 2019-02-19 (×2): qty 100

## 2019-02-19 MED ORDER — FENTANYL CITRATE (PF) 100 MCG/2ML IJ SOLN
INTRAMUSCULAR | Status: DC | PRN
  Administered 2019-02-19 (×2): 50 ug via INTRAVENOUS

## 2019-02-19 MED ORDER — HEPARIN BOLUS VIA INFUSION
3000.0000 [IU] | Freq: Once | INTRAVENOUS | Status: DC
  Filled 2019-02-19: qty 3000

## 2019-02-19 MED ORDER — PROPOFOL 10 MG/ML IV BOLUS
INTRAVENOUS | Status: DC | PRN
  Administered 2019-02-19: 100 mg via INTRAVENOUS

## 2019-02-19 MED ORDER — ROCURONIUM BROMIDE 50 MG/5ML IV SOLN
INTRAVENOUS | Status: AC
  Filled 2019-02-19: qty 1

## 2019-02-19 MED ORDER — ONDANSETRON 4 MG PO TBDP
4.0000 mg | ORAL_TABLET | Freq: Four times a day (QID) | ORAL | Status: DC | PRN
  Filled 2019-02-19: qty 1

## 2019-02-19 MED ORDER — HEPARIN BOLUS VIA INFUSION
4000.0000 [IU] | Freq: Once | INTRAVENOUS | Status: AC
  Administered 2019-02-19: 4000 [IU] via INTRAVENOUS
  Filled 2019-02-19: qty 4000

## 2019-02-19 MED ORDER — MORPHINE SULFATE (PF) 4 MG/ML IV SOLN
4.0000 mg | Freq: Once | INTRAVENOUS | Status: AC
  Administered 2019-02-19: 4 mg via INTRAVENOUS
  Filled 2019-02-19: qty 1

## 2019-02-19 MED ORDER — ROCURONIUM BROMIDE 100 MG/10ML IV SOLN
INTRAVENOUS | Status: DC | PRN
  Administered 2019-02-19: 10 mg via INTRAVENOUS
  Administered 2019-02-19: 40 mg via INTRAVENOUS
  Administered 2019-02-19 (×2): 10 mg via INTRAVENOUS

## 2019-02-19 MED ORDER — PIPERACILLIN-TAZOBACTAM 3.375 G IVPB
3.3750 g | Freq: Three times a day (TID) | INTRAVENOUS | Status: AC
  Administered 2019-02-19 – 2019-02-26 (×24): 3.375 g via INTRAVENOUS
  Filled 2019-02-19 (×23): qty 50

## 2019-02-19 MED ORDER — PANTOPRAZOLE SODIUM 40 MG IV SOLR
40.0000 mg | INTRAVENOUS | Status: DC
  Administered 2019-02-19: 40 mg via INTRAVENOUS
  Filled 2019-02-19: qty 40

## 2019-02-19 MED ORDER — DEXAMETHASONE SODIUM PHOSPHATE 4 MG/ML IJ SOLN
INTRAMUSCULAR | Status: AC
  Filled 2019-02-19: qty 1

## 2019-02-19 MED ORDER — ACETAMINOPHEN 500 MG PO TABS: 1000.0000 mg | ORAL_TABLET | Freq: Once | ORAL | Status: DC

## 2019-02-19 MED ORDER — BUPIVACAINE-EPINEPHRINE (PF) 0.5% -1:200000 IJ SOLN
INTRAMUSCULAR | Status: AC
  Filled 2019-02-19: qty 30

## 2019-02-19 MED ORDER — ONDANSETRON HCL 4 MG/2ML IJ SOLN
INTRAMUSCULAR | Status: DC | PRN
  Administered 2019-02-19: 4 mg via INTRAVENOUS

## 2019-02-19 MED ORDER — ACETAMINOPHEN 325 MG PO TABS
650.0000 mg | ORAL_TABLET | Freq: Four times a day (QID) | ORAL | Status: DC | PRN
  Administered 2019-03-03: 650 mg via ORAL
  Filled 2019-02-19: qty 2

## 2019-02-19 MED ORDER — SUCCINYLCHOLINE CHLORIDE 20 MG/ML IJ SOLN
INTRAMUSCULAR | Status: DC | PRN
  Administered 2019-02-19: 100 mg via INTRAVENOUS

## 2019-02-19 MED ORDER — PROPOFOL 10 MG/ML IV BOLUS
INTRAVENOUS | Status: AC
  Filled 2019-02-19: qty 20

## 2019-02-19 MED ORDER — LIDOCAINE HCL (CARDIAC) PF 100 MG/5ML IV SOSY
PREFILLED_SYRINGE | INTRAVENOUS | Status: DC | PRN
  Administered 2019-02-19: 100 mg via INTRAVENOUS

## 2019-02-19 MED ORDER — ALBUTEROL SULFATE (2.5 MG/3ML) 0.083% IN NEBU: 2.5000 mg | INHALATION_SOLUTION | RESPIRATORY_TRACT | Status: DC | PRN

## 2019-02-19 MED ORDER — ROCURONIUM BROMIDE 50 MG/5ML IV SOLN
INTRAVENOUS | Status: AC
  Filled 2019-02-19: qty 3

## 2019-02-19 MED ORDER — BUPIVACAINE-EPINEPHRINE 0.5% -1:200000 IJ SOLN
INTRAMUSCULAR | Status: DC | PRN
  Administered 2019-02-19: 16 mL

## 2019-02-19 MED ORDER — ONDANSETRON HCL 4 MG/2ML IJ SOLN
INTRAMUSCULAR | Status: AC
  Filled 2019-02-19: qty 2

## 2019-02-19 MED ORDER — PHENYLEPHRINE HCL 10 MG/ML IJ SOLN
INTRAMUSCULAR | Status: DC | PRN
  Administered 2019-02-19: 100 ug via INTRAVENOUS
  Administered 2019-02-19: 200 ug via INTRAVENOUS

## 2019-02-19 MED ORDER — FENTANYL CITRATE (PF) 100 MCG/2ML IJ SOLN: 25.0000 ug | INTRAMUSCULAR | Status: DC | PRN

## 2019-02-19 MED ORDER — FENTANYL CITRATE (PF) 100 MCG/2ML IJ SOLN
INTRAMUSCULAR | Status: AC
  Filled 2019-02-19: qty 2

## 2019-02-19 MED ORDER — SODIUM CHLORIDE (PF) 0.9 % IJ SOLN
INTRAMUSCULAR | Status: AC
  Filled 2019-02-19: qty 10

## 2019-02-19 MED ORDER — POTASSIUM CHLORIDE CRYS ER 20 MEQ PO TBCR: 40.0000 meq | EXTENDED_RELEASE_TABLET | Freq: Once | ORAL | Status: DC

## 2019-02-19 MED ORDER — PIPERACILLIN-TAZOBACTAM 3.375 G IVPB
INTRAVENOUS | Status: AC
  Filled 2019-02-19: qty 50

## 2019-02-19 MED ORDER — ONDANSETRON HCL 4 MG/2ML IJ SOLN: 4.0000 mg | Freq: Once | INTRAMUSCULAR | Status: DC | PRN

## 2019-02-19 MED ORDER — ACETAMINOPHEN 650 MG RE SUPP: 650.0000 mg | Freq: Four times a day (QID) | RECTAL | Status: DC | PRN

## 2019-02-19 MED ORDER — HYDROCODONE-ACETAMINOPHEN 5-325 MG PO TABS
1.0000 | ORAL_TABLET | ORAL | Status: DC | PRN
  Administered 2019-03-03: 2 via ORAL
  Filled 2019-02-19: qty 2

## 2019-02-19 MED ORDER — LIDOCAINE HCL (PF) 2 % IJ SOLN
INTRAMUSCULAR | Status: AC
  Filled 2019-02-19: qty 10

## 2019-02-19 MED ORDER — MORPHINE SULFATE (PF) 4 MG/ML IV SOLN
4.0000 mg | INTRAVENOUS | Status: DC | PRN
  Administered 2019-02-19: 4 mg via INTRAVENOUS
  Filled 2019-02-19: qty 1

## 2019-02-19 MED ORDER — SODIUM CHLORIDE 0.9 % IV SOLN
0.0000 ug/min | INTRAVENOUS | Status: DC
  Administered 2019-02-19: 60 ug/min via INTRAVENOUS
  Administered 2019-02-19: 30 ug/min via INTRAVENOUS
  Administered 2019-02-20 (×2): 60 ug/min via INTRAVENOUS
  Administered 2019-02-20: 50 ug/min via INTRAVENOUS
  Filled 2019-02-19 (×4): qty 1
  Filled 2019-02-19: qty 10

## 2019-02-19 MED ORDER — ACETAMINOPHEN 160 MG/5ML PO SOLN
650.0000 mg | Freq: Once | ORAL | Status: DC
  Filled 2019-02-19: qty 20.3

## 2019-02-19 MED ORDER — SUCCINYLCHOLINE CHLORIDE 20 MG/ML IJ SOLN
INTRAMUSCULAR | Status: AC
  Filled 2019-02-19: qty 1

## 2019-02-19 MED ORDER — ALBUTEROL SULFATE HFA 108 (90 BASE) MCG/ACT IN AERS: 1.0000 | INHALATION_SPRAY | RESPIRATORY_TRACT | Status: DC | PRN

## 2019-02-19 MED ORDER — ONDANSETRON HCL 4 MG/2ML IJ SOLN
4.0000 mg | Freq: Four times a day (QID) | INTRAMUSCULAR | Status: DC | PRN
  Administered 2019-02-19 – 2019-03-01 (×3): 4 mg via INTRAVENOUS
  Filled 2019-02-19 (×4): qty 2

## 2019-02-19 MED ORDER — FUROSEMIDE 20 MG PO TABS: 20.0000 mg | ORAL_TABLET | Freq: Every day | ORAL | Status: DC | PRN

## 2019-02-19 MED ORDER — SODIUM CHLORIDE 0.9 % IV SOLN
INTRAVENOUS | Status: DC
  Administered 2019-02-19: 11:00:00 via INTRAVENOUS
  Administered 2019-02-20: 50 mL/h via INTRAVENOUS

## 2019-02-19 MED ORDER — ENOXAPARIN SODIUM 40 MG/0.4ML ~~LOC~~ SOLN: 40.0000 mg | SUBCUTANEOUS | Status: DC

## 2019-02-19 MED ORDER — UMECLIDINIUM BROMIDE 62.5 MCG/INH IN AEPB
1.0000 | INHALATION_SPRAY | Freq: Every day | RESPIRATORY_TRACT | Status: DC
  Administered 2019-02-19: 1 via RESPIRATORY_TRACT
  Filled 2019-02-19: qty 7

## 2019-02-19 MED ORDER — FLUTICASONE FUROATE-VILANTEROL 100-25 MCG/INH IN AEPB
1.0000 | INHALATION_SPRAY | Freq: Every day | RESPIRATORY_TRACT | Status: DC
  Administered 2019-02-19: 1 via RESPIRATORY_TRACT
  Filled 2019-02-19: qty 28

## 2019-02-19 MED ORDER — DILTIAZEM LOAD VIA INFUSION
10.0000 mg | Freq: Once | INTRAVENOUS | Status: AC
  Administered 2019-02-19: 10 mg via INTRAVENOUS
  Filled 2019-02-19: qty 10

## 2019-02-19 MED ORDER — PHENYLEPHRINE HCL 10 MG/ML IJ SOLN
INTRAMUSCULAR | Status: AC
  Filled 2019-02-19: qty 2

## 2019-02-19 MED ORDER — ORAL CARE MOUTH RINSE
15.0000 mL | OROMUCOSAL | Status: DC
  Administered 2019-02-19 – 2019-02-20 (×10): 15 mL via OROMUCOSAL

## 2019-02-19 MED ORDER — ACETAMINOPHEN 10 MG/ML IV SOLN
1000.0000 mg | Freq: Once | INTRAVENOUS | Status: AC
  Administered 2019-02-19: 1000 mg via INTRAVENOUS
  Filled 2019-02-19: qty 100

## 2019-02-19 MED ORDER — FLUTICASONE-UMECLIDIN-VILANT 100-62.5-25 MCG/INH IN AEPB: 1.0000 | INHALATION_SPRAY | Freq: Every day | RESPIRATORY_TRACT | Status: DC

## 2019-02-19 MED ORDER — DEXAMETHASONE SODIUM PHOSPHATE 10 MG/ML IJ SOLN
INTRAMUSCULAR | Status: DC | PRN
  Administered 2019-02-19: 4 mg via INTRAVENOUS

## 2019-02-19 MED ORDER — CHLORHEXIDINE GLUCONATE 0.12% ORAL RINSE (MEDLINE KIT)
15.0000 mL | Freq: Two times a day (BID) | OROMUCOSAL | Status: DC
  Administered 2019-02-19 – 2019-02-27 (×14): 15 mL via OROMUCOSAL

## 2019-02-19 MED ORDER — PROTHROMBIN COMPLEX CONC HUMAN 500 UNITS IV KIT
5000.0000 [IU] | PACK | Status: AC
  Administered 2019-02-19: 5000 [IU] via INTRAVENOUS
  Filled 2019-02-19: qty 5000

## 2019-02-19 MED ORDER — SUGAMMADEX SODIUM 200 MG/2ML IV SOLN
INTRAVENOUS | Status: AC
  Filled 2019-02-19: qty 2

## 2019-02-19 MED ORDER — GABAPENTIN 300 MG PO CAPS
300.0000 mg | ORAL_CAPSULE | Freq: Three times a day (TID) | ORAL | Status: DC
  Filled 2019-02-19 (×3): qty 1

## 2019-02-19 MED ORDER — LACTATED RINGERS IV SOLN
INTRAVENOUS | Status: DC | PRN
  Administered 2019-02-19: 19:00:00 via INTRAVENOUS

## 2019-02-19 SURGICAL SUPPLY — 58 items
ADH SKN CLS APL DERMABOND .7 (GAUZE/BANDAGES/DRESSINGS) ×1
APL SWBSTK 6 STRL LF DISP (MISCELLANEOUS) ×1
APPLICATOR COTTON TIP 6 STRL (MISCELLANEOUS) IMPLANT
APPLICATOR COTTON TIP 6IN STRL (MISCELLANEOUS) ×3
APPLIER CLIP LOGIC TI 5 (MISCELLANEOUS) IMPLANT
APR CLP MED LRG 33X5 (MISCELLANEOUS)
BLADE SURG SZ10 CARB STEEL (BLADE) ×2 IMPLANT
BLADE SURG SZ11 CARB STEEL (BLADE) ×3 IMPLANT
BULB RESERV EVAC DRAIN JP 100C (MISCELLANEOUS) ×2 IMPLANT
CANISTER SUCT 1200ML W/VALVE (MISCELLANEOUS) ×3 IMPLANT
CHLORAPREP W/TINT 26ML (MISCELLANEOUS) ×3 IMPLANT
COVER WAND RF STERILE (DRAPES) ×1 IMPLANT
CUTTER FLEX LINEAR 45M (STAPLE) ×1 IMPLANT
DERMABOND ADVANCED (GAUZE/BANDAGES/DRESSINGS) ×2
DERMABOND ADVANCED .7 DNX12 (GAUZE/BANDAGES/DRESSINGS) ×1 IMPLANT
DRAIN CHANNEL JP 19F (MISCELLANEOUS) ×3 IMPLANT
DRSG OPSITE POSTOP 4X10 (GAUZE/BANDAGES/DRESSINGS) ×2 IMPLANT
DRSG TEGADERM 2-3/8X2-3/4 SM (GAUZE/BANDAGES/DRESSINGS) ×3 IMPLANT
ELECT REM PT RETURN 9FT ADLT (ELECTROSURGICAL) ×3
ELECTRODE REM PT RTRN 9FT ADLT (ELECTROSURGICAL) ×1 IMPLANT
GLOVE BIO SURGEON STRL SZ 6.5 (GLOVE) ×4 IMPLANT
GLOVE BIO SURGEONS STRL SZ 6.5 (GLOVE) ×3
GOWN STRL REUS W/ TWL LRG LVL3 (GOWN DISPOSABLE) ×3 IMPLANT
GOWN STRL REUS W/TWL LRG LVL3 (GOWN DISPOSABLE) ×9
GRASPER SUT TROCAR 14GX15 (MISCELLANEOUS) ×1 IMPLANT
HANDLE YANKAUER SUCT BULB TIP (MISCELLANEOUS) ×3 IMPLANT
IRRIGATION STRYKERFLOW (MISCELLANEOUS) IMPLANT
IRRIGATOR STRYKERFLOW (MISCELLANEOUS) ×3
IV NS 1000ML (IV SOLUTION) ×3
IV NS 1000ML BAXH (IV SOLUTION) ×1 IMPLANT
KIT TURNOVER KIT A (KITS) ×3 IMPLANT
LIGASURE LAP MARYLAND 5MM 37CM (ELECTROSURGICAL) ×2 IMPLANT
LOOP SUT CHROMIC 0 SGL3 (SUTURE) ×2 IMPLANT
LOOP SUT CHROMIC 2-0 SGL1 (SUTURE) ×2 IMPLANT
NEEDLE HYPO 22GX1.5 SAFETY (NEEDLE) ×3 IMPLANT
NEEDLE VERESS 14GA 120MM (NEEDLE) ×1 IMPLANT
NS IRRIG 500ML POUR BTL (IV SOLUTION) ×3 IMPLANT
PACK LAP CHOLECYSTECTOMY (MISCELLANEOUS) ×3 IMPLANT
PENCIL ELECTRO HAND CTR (MISCELLANEOUS) ×2 IMPLANT
POUCH ENDO CATCH 10MM SPEC (MISCELLANEOUS) ×3 IMPLANT
RELOAD 45 VASCULAR/THIN (ENDOMECHANICALS) IMPLANT
RELOAD STAPLE 45 2.5 WHT GRN (ENDOMECHANICALS) IMPLANT
RELOAD STAPLE TA45 3.5 REG BLU (ENDOMECHANICALS) ×3 IMPLANT
SCISSORS METZENBAUM CVD 33 (INSTRUMENTS) ×3 IMPLANT
SET TUBE SMOKE EVAC HIGH FLOW (TUBING) ×3 IMPLANT
SPONGE GAUZE 2X2 8PLY STER LF (GAUZE/BANDAGES/DRESSINGS) ×1
SPONGE GAUZE 2X2 8PLY STRL LF (GAUZE/BANDAGES/DRESSINGS) ×2 IMPLANT
STAPLER SKIN PROX 35W (STAPLE) ×2 IMPLANT
SUT ETHILON 3-0 FS-10 30 BLK (SUTURE) ×3
SUT MNCRL AB 4-0 PS2 18 (SUTURE) ×3 IMPLANT
SUT PDS AB 0 CT1 27 (SUTURE) ×4 IMPLANT
SUT VIC AB 3-0 SH 27 (SUTURE) ×3
SUT VIC AB 3-0 SH 27X BRD (SUTURE) IMPLANT
SUT VICRYL PLUS ABS 0 54 (SUTURE) ×3 IMPLANT
SUTURE EHLN 3-0 FS-10 30 BLK (SUTURE) IMPLANT
TRAY FOLEY MTR SLVR 16FR STAT (SET/KITS/TRAYS/PACK) ×3 IMPLANT
TROCAR XCEL 12X100 BLDLESS (ENDOMECHANICALS) ×3 IMPLANT
TROCAR XCEL NON-BLD 5MMX100MML (ENDOMECHANICALS) ×6 IMPLANT

## 2019-02-19 NOTE — ED Notes (Signed)
Given new nausea meds.

## 2019-02-19 NOTE — Consult Note (Signed)
Cardiology Consultation:   Patient ID: Brandy Armstrong; [id number]; June 10, 1946   Admit date: 02/19/2019 Date of Consult: 02/19/2019  Primary Care Provider: [name], FNP Primary Cardiologist: Rockey Situ   Patient Profile:   Brandy Armstrong is a 73 y.o. female with a hx of now permanent Afib on Eliquis s/p ablation in 2013 with recurrent Afib s/p repeat ablation in 01/2015 s/p DCCV in 02/2015 s/p DCCV in 08/2016 with recurrent Afib 1 month later leading to the discontinuation of amiodarone and rate control strategy since, aortic atherosclerosis, chronic diastolic CHF, GI bleed on Xarelto, COPD secondary to tobacco abuse quitting in 2012, anemia who is being seen today for the evaluation of permanent Afib with RVR at the request of Dr. Benjie Karvonen.  History of Present Illness:   [name] was recently seen in the office on 02/05/2019 for follow up and was noting recent trouble with SOB and lower extremity edema in the setting of high fluid intake and symptom improvement with Lasix. Weight at that time was noted to be 99 kg, which was down 3 kg from 04/2018 visit. Most recent echo from 2015 showed EF 50-55%, mild concentric LVH. Nuclear stress test from 2015 was low risk.   Upon the patient's arrival to [facility] they were found to have acute appendicitis with an obstructing appendicolith. She was noted to be in Afib with RVR with ventricular rates in the 110s to 140s bpm. Her BP initially precluded starting the diltiazem gtt, though this has improved. She continues to note nausea and vomiting requiring phenergan and Zofran. She continues to note abdominal pain and has received IV morphine. Potassium was noted to be low at 3.3 and is being repleted via IV. She has been placed on empiric Zosyn. WBC 9.0, HGB 12.4.  TSH 5.461. Magnesium 2.0. EKG showed Afib with RVR as below. Cardiology is asked to assist in management of rate control. She has permanent Afib with ventricular rates currently in the 110s to 140s bpm  in the setting of appendicitis. She is asymptomatic from a cardiac perspective.    Past Medical History:  Diagnosis Date  . Barretts esophagus   . Chest pain    a. 02/2014 Myoview: Ef 50%, no ischemia.  . Colon polyps   . COPD (chronic obstructive pulmonary disease) ([place])   . Depression with anxiety   . GERD (gastroesophageal reflux disease)   . Neuropathy   . Permanent atrial fibrillation    a. s/p ablation 01/2012 in [facility] by Dr. Boyd Kerbs;  b. On sotalol & Xarelto;  c. 02/2014 Echo: EF 50-55%, mild conc LVH, nl LA size/structure;  d. Recurrent afib 8/15 & 08/29/2014.  [name] Rectal fistula   . Rheumatoid arthritis ([place])    On methotrexate and orencia  . Urinary incontinence   . Vitamin D deficiency     Past Surgical History:  Procedure Laterality Date  . ABDOMINAL HYSTERECTOMY  1992  . CARDIAC ELECTROPHYSIOLOGY STUDY AND ABLATION  2013  . CHOLECYSTECTOMY  1987  . COLONOSCOPY N/A 05/08/2015   Procedure: COLONOSCOPY;  Surgeon: Manya Silvas, MD;  Location: [facility] ENDOSCOPY;  Service: Endoscopy;  Laterality: N/A;  . ESOPHAGOGASTRODUODENOSCOPY N/A 05/06/2015   Procedure: ESOPHAGOGASTRODUODENOSCOPY (EGD);  Surgeon: Lollie Sails, MD;  Location: [facility] ENDOSCOPY;  Service: Endoscopy;  Laterality: N/A;  . OOPHORECTOMY    . oophrectomy Bilateral 1992     Home Meds: Prior to Admission medications   Medication Sig Start Date End Date Taking? Authorizing Provider  Arne Cleveland  5 MG TABS tablet Take 1 tablet (5 mg total) by mouth 2 (two) times daily. 05/11/18  Yes Minna Merritts, MD  esomeprazole (NEXIUM) 20 MG capsule Take 20 mg by mouth daily at 12 noon.   Yes [provider]  Fluticasone-Umeclidin-Vilant (TRELEGY ELLIPTA) 100-62.5-25 MCG/INH AEPB Inhale 1 puff into the lungs daily. 01/07/19  Yes Wilhelmina Mcardle, MD  gabapentin (NEURONTIN) 300 MG capsule Take 1 capsule (300 mg total) by mouth 3 (three) times daily. 02/10/19  Yes Arnett, Yvetta Coder, FNP  HUMIRA PEN 40 MG/0.4ML PNKT  Inject 40 mg as directed. Every 2 weeks 07/07/18  Yes [provider]  hydroxychloroquine (PLAQUENIL) 200 MG tablet Take 200 mg by mouth 2 (two) times daily.  06/21/16  Yes [provider]  propranolol ER (INDERAL LA) 80 MG 24 hr capsule Take 1 capsule (80 mg total) by mouth daily. 05/11/18  Yes Gollan, Kathlene November, MD  rosuvastatin (CRESTOR) 10 MG tablet Take 1 tablet (10 mg total) by mouth daily. 12/15/18  Yes Arnett, Yvetta Coder, FNP  albuterol (PROVENTIL HFA;VENTOLIN HFA) 108 (90 Base) MCG/ACT inhaler Inhale 1-2 puffs into the lungs every 4 (four) hours as needed for wheezing or shortness of breath. 01/01/18   Wilhelmina Mcardle, MD  furosemide (LASIX) 20 MG tablet Take 1 tablet (20 mg total) by mouth daily as needed. 02/05/19   Minna Merritts, MD  ibuprofen (ADVIL,MOTRIN) 400 MG tablet Take 400 mg by mouth every 4 (four) hours as needed.  09/28/18   [provider]  lidocaine (XYLOCAINE) 5 % ointment Apply 1 application topically as needed.    [provider]  potassium chloride (K-DUR) 10 MEQ tablet Take 1 tablet (10 mEq total) by mouth daily as needed. 02/05/19   Minna Merritts, MD  traMADol (ULTRAM) 50 MG tablet Take 1 tablet (50 mg total) by mouth every 12 (twelve) hours as needed for severe pain. 12/15/18   [name], FNP    Inpatient Medications: Scheduled Meds: . fluticasone furoate-vilanterol  1 puff Inhalation Daily   And  . umeclidinium bromide  1 puff Inhalation Daily  . gabapentin  300 mg Oral TID  . potassium chloride  40 mEq Oral Once   Continuous Infusions: . sodium chloride 50 mL/hr at 02/19/19 1058  . diltiazem (CARDIZEM) infusion 3 mg/hr (02/19/19 1425)  . famotidine (PEPCID) IV Stopped (02/19/19 1124)  . piperacillin-tazobactam (ZOSYN)  IV 3.375 g (02/19/19 1123)  . potassium chloride 10 mEq (02/19/19 1426)  . potassium chloride     PRN Meds: acetaminophen **OR** acetaminophen, albuterol, furosemide, HYDROcodone-acetaminophen,  morphine injection, ondansetron **OR** ondansetron (ZOFRAN) IV, potassium chloride SA  Allergies:   Allergies  Allergen Reactions  . Latex     Itching Itching    Social History:   Social History   Socioeconomic History  . Marital status: Single    Spouse name: Not on file  . Number of children: 0  . Years of education: 27  . Highest education level: Not on file  Occupational History  . Occupation: Retired  Scientific laboratory technician  . Financial resource strain: Not on file  . Food insecurity:    Worry: Not on file    Inability: Not on file  . Transportation needs:    Medical: Not on file    Non-medical: Not on file  Tobacco Use  . Smoking status: Former Smoker    Packs/day: 1.00    Years: 40.00    Pack years: 40.00  Last attempt to quit: 12/16/2011    Years since quitting: 7.1  . Smokeless tobacco: Never Used  Substance and Sexual Activity  . Alcohol use: Not Currently    Alcohol/week: 0.0 standard drinks    Comment: Occasionally has a drink  . Drug use: No  . Sexual activity: Never  Lifestyle  . Physical activity:    Days per week: Not on file    Minutes per session: Not on file  . Stress: Not on file  Relationships  . Social connections:    Talks on phone: Not on file    Gets together: Not on file    Attends religious service: Not on file    Active member of club or organization: Not on file    Attends meetings of clubs or organizations: Not on file    Relationship status: Not on file  . Intimate partner violence:    Fear of current or ex partner: Not on file    Emotionally abused: Not on file    Physically abused: Not on file    Forced sexual activity: Not on file  Other Topics Concern  . Not on file  Social History Narrative   Ms. Tabbert was born and reared in [place]. She attended [employer] and graduated in 1971 with her [employer] in Education. 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 She then worked  for a [employer] in [place]. She is single. She is currently retired. She loves reading. She loves to spend time with her dog.       Caffeine use: 2 cups coffee per day   2 glasses iced tea/ day   Right-handed     Family History:  Family History  Problem Relation Age of Onset  . Depression Mother   . Pancreatic cancer Mother   . Colon cancer Father   . Breast cancer Sister 71  . Diabetes Brother   . Breast cancer Cousin   . Neuropathy Neg Hx     ROS:  Review of Systems  Constitutional: Positive for malaise/fatigue. Negative for chills, diaphoresis, fever and weight loss.  HENT: Negative for congestion.   Eyes: Negative for discharge and redness.  Respiratory: Negative for cough, hemoptysis, sputum production, shortness of breath and wheezing.   Cardiovascular: Negative for chest pain, palpitations, orthopnea, claudication, leg swelling and PND.  Gastrointestinal: Positive for abdominal pain, nausea and vomiting. Negative for blood in stool, heartburn and melena.  Genitourinary: Negative for hematuria.  Musculoskeletal: Negative for falls and myalgias.  Skin: Negative for rash.  Neurological: Positive for weakness. Negative for dizziness, tingling, tremors, sensory change, speech change, focal weakness and loss of consciousness.  Endo/Heme/Allergies: Does not bruise/bleed easily.  Psychiatric/Behavioral: Negative for substance abuse. The patient is not nervous/anxious.   All other systems reviewed and are negative.     Physical Exam/Data:   Vitals:   02/19/19 1130 02/19/19 1141 02/19/19 1200 02/19/19 1300  BP: 92/63 (!) 78/68 (!) 104/53 115/87  Pulse: (!) 112 97 68 (!) 105  Resp: 17 19 18  (!) 29  Temp:      TempSrc:      SpO2: 93% 94% (!) 89% 91%   No intake or output data in the 24 hours ending 02/19/19 1443 There were no vitals filed for this visit. There is no height or weight on file to calculate BMI.   Physical Exam: General: Well developed, well  nourished, in mild abdominal discomfort. Head:  Normocephalic, atraumatic, sclera non-icteric, no xanthomas, nares without discharge.  Neck: Negative for carotid bruits. JVD not elevated. Lungs: Clear bilaterally to auscultation without wheezes, rales, or rhonchi. Breathing is unlabored. Heart: Tachycardic, irregularly irregular with S1 S2. No murmurs, rubs, or gallops appreciated. Abdomen: Soft, non-tender, non-distended with normoactive bowel sounds. No hepatomegaly. No rebound/guarding. No obvious abdominal masses. Msk:  Strength and tone appear normal for age. Extremities: No clubbing or cyanosis. No edema. Distal pedal pulses are 2+ and equal bilaterally. Neuro: Alert and oriented X 3. No facial asymmetry. No focal deficit. Moves all extremities spontaneously. Psych:  Responds to questions appropriately with a normal affect.   EKG:  The EKG was personally reviewed and demonstrates: Afib with RVR, 125 bpm, left axis  Telemetry:  Telemetry was personally reviewed and demonstrates: Afib with RVR, 110s to 140s bpm  Weights: There were no vitals filed for this visit.  Relevant CV Studies: Echo pending this admission  Echo 2015: EF 50-55%, mild concentric LVH  Myoview 2015: Normal  Laboratory Data:  Chemistry Recent Labs  Lab 02/19/19 0633  NA 138  K 3.3*  CL 102  CO2 27  GLUCOSE 138*  BUN 11  CREATININE 0.80  CALCIUM 8.8*  GFRNONAA >60  GFRAA >60  ANIONGAP 9    Recent Labs  Lab 02/19/19 0633  PROT 6.6  ALBUMIN 3.3*  AST 22  ALT 11  ALKPHOS 68  BILITOT 0.7   Hematology Recent Labs  Lab 02/19/19 0633  WBC 9.0  RBC 4.46  HGB 12.4  HCT 39.9  MCV 89.5  MCH 27.8  MCHC 31.1  RDW 14.8  PLT 213   Cardiac EnzymesNo results for input(s): TROPONINI in the last 168 hours. No results for input(s): TROPIPOC in the last 168 hours.  BNPNo results for input(s): BNP, PROBNP in the last 168 hours.  DDimer No results for input(s): DDIMER in the last 168  hours.  Radiology/Studies:  Ct Abdomen Pelvis W Contrast  Result Date: 02/19/2019 IMPRESSION: 1. Acute appendicitis. The appendix is enlarged, to 17 mm. This either represents enlarged pattern of possibly contain abscess. I favor the former. 2. Obstructing appendicolith. 3. Small free fluid. Electronically Signed   By: San Morelle M.D.   On: 02/19/2019 08:06    Assessment and Plan:   1. Permanent Afib with RVR: -In the setting of appendicitis  -As her pain improves, her heart rate will improve as well throughout the perioperative time frame -From a cardiac perspective, she is relatively asymptomatic  -Rate control with diltiazem gtt 5 mg/hr as BP allows (currently, BP is in the 628Z to 662H systolic) -Hold Eliquis to wash out with last dose being in the AM of 02/18/2019 -Heparin gtt per pharmacy given a CHADS2VASc of at least 5 (CHF, HTN, age x 1, vascular disease, female)  2. Chronic diastolic CHF: -She does not appear grossly volume up at this time -High risk of volume overload in the perioperative time frame secondary to Afib with RVR and IV fluids needed preoperatively   3. Acute appendicitis: -Surgery planning for OR on 2/22 following Eliquis wash out (last dose AM of 02/18/2019) -Minimize IV fluids as able given diastolic CHF and Afib with RVR  4. Hypokalemia: -Replete to goal of 4.0   For questions or updates, please contact [place] Please consult [email or web address] for contact info under Cardiology/STEMI.   Signed, Christell Faith, PA-C [place] Pager: [phone] 02/19/2019, 2:43 PM

## 2019-02-19 NOTE — Op Note (Signed)
Preoperative diagnosis: Acute appendicitis.  Postoperative diagnosis: Acute perforated appendicitis  Procedure: Laparoscopic converted to open appendectomy.  Anesthesia: GETA  Surgeon: Dr. Windell Moment, MD  Wound Classification: Contaminated  Indications: Patient is a 73 y.o. female  presented with right lower quadrant pain of 2 days of duration, fever, elevated WBC. Computed tomography scan and physical examination were consistent with acute appendicitis with no perforation.  Due to anticoagulation surgery was hold for 24 hours but due to deterioration of clinical status with altered mental status, fever and progressing abdominal pain, it was decided to proceed with appendectomy.  Findings: 1. Acutely perforated appendix 2. No peri-appendiceal abscess or phlegmon 3. Normal anatomy 4.  Conversion to open due to unable to seal the cecal stump laparoscopically. 5. Adequate hemostasis.   Description of procedure: The patient was placed on the operating table in the supine position. General anesthesia was induced. A time-out was completed verifying correct patient, procedure, site, positioning, and implant(s) and/or special equipment prior to beginning this procedure. A Foley catheter and orogastric tubes were placed. The abdomen was prepped and draped in the usual sterile fashion.  An incision was made in above the umbilicus.  Dissection was taken down to the fascia.  The fascia was opened and abdominal cavity entered and a Hassan technique.  A 12 mm Hassan trocar was inserted supraumbilically. The abdomen was insufflated with carbon dioxide to a pressure of 15 mmHg. The patient tolerated insufflation well. The laparoscope was inserted and the abdomen inspected. No injuries from initial trocar placement were noted. Turbid fluid was noted in the right lower quadrant. Under direct visualization, an 5-mm trocar was inserted in the left lower quadrant lateral to the rectus muscle. A 5-mm port was  then placed above the symphysis pubis on midline.  Care was taken to avoid injury to the bladder or inferior epigastric vessels. The table was placed in the Trendelenburg position with the right side elevated.  The cecum was gently grasped with an endoscopic graspers and pulled toward (the left upper quadrant). An atraumatic grasper was then passed through the suprapubic port and omentum was dissected away until the appendix was identified. The appendix was then grasped and elevated. It was noted to be perforated.  The mesoappendix was divided with the LigaSure up to the base of the cecum.  At that point due to the close proximally over the perforation at the cecum it was decided to use an Endoloop for division of the appendix. At that time the base of the appendix was divided and the end of the did not seal cecum orifice.  Due to this he was decided to convert surgery to open.  A midline incision was done and the session was carried down to fascia.  Fascia was opened.  All trochars were removed under direct visualization no bleeding was identified.  The cecum was completely mobilized to the midline and the cecal orifice was identified.  This was closed with 2-0 Vicryl in 2 layers. A drain was left on the right lower quadrant and pelvis.    Hemostasis was achieved. The midline fascia was closed with 0 PDS.  And the skin was closed with staples.  Laparoscopic incisions were closed with staples.  Specimen: Appendix  Complications: None  Estimated Blood Loss: 200 mL

## 2019-02-19 NOTE — Anesthesia Post-op Follow-up Note (Signed)
Anesthesia QCDR form completed.        

## 2019-02-19 NOTE — Anesthesia Procedure Notes (Signed)
Procedure Name: Intubation Date/Time: 02/19/2019 6:42 PM Performed by: Lowry Bowl, CRNA Pre-anesthesia Checklist: Patient identified, Emergency Drugs available, Suction available and Patient being monitored Patient Re-evaluated:Patient Re-evaluated prior to induction Oxygen Delivery Method: Circle system utilized Preoxygenation: Pre-oxygenation with 100% oxygen Induction Type: IV induction, Cricoid Pressure applied and Rapid sequence Ventilation: Mask ventilation without difficulty Laryngoscope Size: McGraph and 4 Grade View: Grade I Tube type: Oral Tube size: 7.0 mm Number of attempts: 1 Airway Equipment and Method: Stylet Placement Confirmation: ETT inserted through vocal cords under direct vision,  positive ETCO2 and breath sounds checked- equal and bilateral Secured at: 22 cm Tube secured with: Tape Dental Injury: Teeth and Oropharynx as per pre-operative assessment

## 2019-02-19 NOTE — ED Provider Notes (Signed)
_________________________ 4:06 PM on 02/19/2019 -----------------------------------------  This patient has been admitted by Dr. Windell Moment from surgery for acute appendicitis.  She is in the emergency room awaiting for her surgery.  She was being managed by Dr. Kerman Passey until 3PM. I was approached by the nurse with concerns the patient's mental status was declining.  Patient seems to be delirious, she is confused and not really answering questions appropriately.  She is agitated standing up from the bed.  She is currently on a diltiazem drip for atrial fibrillation.  She has been evaluated by Dr. Benjie Karvonen from the hospitalist service and Dr. Rockey Situ from cardiology.  I spoke with Dr. Benjie Karvonen who confirms that patient was alert and oriented when she saw her earlier today and this is a change in her mental status.  I spoke with Dr. Windell Moment and let him know the patient was becoming delirious and now had a fever concerning for worsening infection/rupture of her appendix.  She is tender to palpation diffusely with rebound and guarding on exam. Patient has received Zosyn  He will come to the emergency room to evaluate the patient.   _________________________ 4:20 PM on 02/19/2019 ----------------------------------------- Dr. Windell Moment at bedsied evaluating patient for emergent surgery.   _________________________ 5:50 PM on 02/19/2019 ----------------------------------------- Patient leaving ED to [place], [place], MD 02/19/19 1810

## 2019-02-19 NOTE — Consult Note (Signed)
Pharmacy Electrolyte Monitoring Consult:  Pharmacy consulted to assist in monitoring and replacing electrolytes in this 73 year old female with permanent atrial fibrillation and chronic diastolic heart failure followed by Dr. Rockey Situ who presents to the emergency room due to abdominal pain and found to have acute appendicitis and atrial fibrillation with RVR.     Labs:   Potassium (mmol/L)  Date Value  02/19/2019 3.3 (L)  01/04/2015 3.8   Magnesium (mg/dL)  Date Value  02/19/2019 2.0  01/03/2015 1.9   Calcium (mg/dL)  Date Value  02/19/2019 8.8 (L)   Calcium, Total (mg/dL)  Date Value  01/04/2015 8.5   Albumin (g/dL)  Date Value  02/19/2019 3.3 (L)  01/02/2016 3.9  08/29/2014 2.6 (L)    Assessment/Plan: Because this patient can not take anything by mouth at this time we are replacing with IV KCl 40 mEq for a target potassium level of approximately 4 mmol/L. We will order a follow-up potassium level for tomorrow morning. Add-on magnesium returned wnl.  Vallery Sa, PharmD Clinical Pharmacist

## 2019-02-19 NOTE — ED Notes (Addendum)
BP dropping. Pt still vomiting.  Admitting MD mody paged.remains alert and oriented.

## 2019-02-19 NOTE — ED Notes (Signed)
Pt has not urinated. Bladder scan done showing 221 ml. Strip printed. Pt now sleeping.

## 2019-02-19 NOTE — ED Notes (Addendum)
Transported to OR with English as a second language teacher.  Family Brandy Armstrong arrived and going to OR with pt. VSS on leaving unit. Pt remains altered

## 2019-02-19 NOTE — Progress Notes (Signed)
   02/19/19 1500  Clinical Encounter Type  Visited With Patient not available;Health care provider  Visit Type Initial  Referral From Physician   OR received to complete or update an AD. Upon arrival to the patient's room, the patient expressed having a good deal of pain and a need to go to the restroom. Chaplain requested the patient's nurse. Will follow up at a later time.

## 2019-02-19 NOTE — ED Notes (Addendum)
Spoke with admitting MD diaz about pt nausea and vomiting despite zofran earlier.  Verbal orders received.

## 2019-02-19 NOTE — ED Notes (Addendum)
Pain getting acutely worse. Pt agitated. Dr Alfred Levins notified and at bedside.  bp spiked.  Giving pain medication. Pt coming off bed. Disoriented.

## 2019-02-19 NOTE — ED Notes (Signed)
Pt aware of need for urine specimen. 

## 2019-02-19 NOTE — ED Notes (Signed)
Pt nauseated. HR varies. EKG done and atrial fib RVR noted with long QTC.  Dr Peyton Najjar paged and notified of patient status.  He will having admitting MD see patient. Will hold on home meds except for abx, nausea tx, and pepcid until admitting sees patient.

## 2019-02-19 NOTE — ED Notes (Signed)
Pt given lemon swabs. Waiting on surgery consult. NAD. No needs. Given remote.

## 2019-02-19 NOTE — ED Provider Notes (Signed)
[facility] Emergency Department Provider Note  ____________________________________________  Time seen: Approximately 6:28 AM  I have reviewed the triage vital signs and the nursing notes.   HISTORY  Chief Complaint Abdominal Pain    HPI Brandy Armstrong is a 73 y.o. female with a history of atrial fibrillation, COPD, GERD, rheumatoid arthritis who complains of generalized abdominal pain for the last 2 days, gradual onset, continuously worsening, nonradiating.  No aggravating or alleviating factors.  Associated with constipation, unsure of her last bowel movement.  Denies vomiting.  Reports that she is eating and drinking normally.  No fevers or chills.  No chest pain or shortness of breath.  Not exertional, not positional.  She reports that she is been holding her Lasix for the last several days due to worries about dehydration.  She also takes Eliquis for her A. fib which she has been compliant with.      Past Medical History:  Diagnosis Date  . Atrial fibrillation [facility])    a. s/p ablation 01/2012 in [facility] by Dr. Boyd Kerbs;  b. On sotalol & Xarelto;  c. 02/2014 Echo: EF 50-55%, mild conc LVH, nl LA size/structure;  d. Recurrent afib 8/15 & 08/29/2014.  . Barretts esophagus   . Chest pain    a. 02/2014 Myoview: Ef 50%, no ischemia.  . Colon polyps   . COPD (chronic obstructive pulmonary disease) ([place])   . COPD (chronic obstructive pulmonary disease) ([place])   . Depression with anxiety   . GERD (gastroesophageal reflux disease)   . Neuropathy   . Rectal fistula   . Rheumatoid arthritis ([place])    On methotrexate and orencia  . Urinary incontinence   . Vitamin D deficiency      Patient Active Problem List   Diagnosis Date Noted  . Chronic diastolic heart failure ([place]) 02/10/2019  . Atherosclerosis of aorta ([place]) 02/05/2019  . AAA (abdominal aortic aneurysm) without rupture ([place]) 12/17/2018  . COPD exacerbation ([place]) 12/09/2018  . Iron deficiency anemia  due to chronic blood loss 11/03/2018  . Obesity, Class II, BMI 35-39.9 09/23/2018  . Primary osteoarthritis of right knee 09/23/2018  . GERD (gastroesophageal reflux disease) 08/24/2018  . Hives 06/17/2018  . B12 deficiency 06/17/2018  . Depression, recurrent ([place]) 03/23/2018  . Purpura ([place]) 08/06/2017  . Radicular pain in left arm 09/30/2016  . Cellulitis 09/26/2016  . Abscess 09/24/2016  . PAD (peripheral artery disease) ([place]) 08/13/2016  . Anemia 08/13/2016  . Urinary urgency 08/06/2016  . Chronic back pain 05/20/2016  . Neuropathy 10/05/2015  . Tremors of nervous system 06/18/2015  . GI bleed due to NSAIDs 05/04/2015  . Atrial fibrillation ([place]) 03/11/2014  . COPD (chronic obstructive pulmonary disease) ([place]) 03/11/2014  . Flexor hallucis longus tendinitis 11/09/2013  . Vertigo 09/16/2013  . Chronic steroid use 09/11/2013  . Osteoarthritis 09/11/2013  . Shortness of breath 08/18/2013  . Fatigue 08/18/2013  . Barrett's esophagus 08/18/2013  . Chronic diarrhea 08/18/2013  . Numbness and tingling 08/18/2013  . Screening for breast cancer 08/18/2013  . Rheumatoid arthritis ([place]) 05/31/2013  . Seborrheic keratosis 12/14/2012  . Benign neoplasm of colon 11/23/2012  . Family history of malignant neoplasm of gastrointestinal tract 11/23/2012  . Prediabetes 12/16/2011     Past Surgical History:  Procedure Laterality Date  . ABDOMINAL HYSTERECTOMY  1992  . CARDIAC ELECTROPHYSIOLOGY STUDY AND ABLATION  2013  . CHOLECYSTECTOMY  1987  . COLONOSCOPY N/A 05/08/2015   Procedure: COLONOSCOPY;  Surgeon: Manya Silvas,  MD;  Location: [facility] ENDOSCOPY;  Service: Endoscopy;  Laterality: N/A;  . ESOPHAGOGASTRODUODENOSCOPY N/A 05/06/2015   Procedure: ESOPHAGOGASTRODUODENOSCOPY (EGD);  Surgeon: Lollie Sails, MD;  Location: [facility] ENDOSCOPY;  Service: Endoscopy;  Laterality: N/A;  . OOPHORECTOMY    . oophrectomy Bilateral 1992     Prior to Admission medications   Medication Sig Start  Date End Date Taking? Authorizing Provider  albuterol (PROVENTIL HFA;VENTOLIN HFA) 108 (90 Base) MCG/ACT inhaler Inhale 1-2 puffs into the lungs every 4 (four) hours as needed for wheezing or shortness of breath. 01/01/18   Wilhelmina Mcardle, MD  ELIQUIS 5 MG TABS tablet Take 1 tablet (5 mg total) by mouth 2 (two) times daily. 05/11/18   Minna Merritts, MD  esomeprazole (NEXIUM) 20 MG capsule Take 20 mg by mouth daily at 12 noon.    [provider]  Fluticasone-Umeclidin-Vilant (TRELEGY ELLIPTA) 100-62.5-25 MCG/INH AEPB Inhale 1 puff into the lungs daily. 01/07/19   Wilhelmina Mcardle, MD  furosemide (LASIX) 20 MG tablet Take 1 tablet (20 mg total) by mouth daily as needed. 02/05/19   Minna Merritts, MD  gabapentin (NEURONTIN) 300 MG capsule Take 1 capsule (300 mg total) by mouth 3 (three) times daily. 02/10/19   [name], FNP  HUMIRA PEN 40 MG/0.4ML PNKT  07/07/18   [provider]  hydroxychloroquine (PLAQUENIL) 200 MG tablet Take 200 mg by mouth. 06/21/16   [provider]  ibuprofen (ADVIL,MOTRIN) 400 MG tablet Take 400 mg by mouth every 4 (four) hours as needed.  09/28/18   [provider]  lidocaine (XYLOCAINE) 5 % ointment Apply 1 application topically as needed.    [provider]  potassium chloride (K-DUR) 10 MEQ tablet Take 1 tablet (10 mEq total) by mouth daily as needed. 02/05/19   Minna Merritts, MD  predniSONE (STERAPRED UNI-PAK 21 TAB) 10 MG (21) TBPK tablet Use as directed Patient not taking: Reported on 02/10/2019 01/07/19   Wilhelmina Mcardle, MD  propranolol ER (INDERAL LA) 80 MG 24 hr capsule Take 1 capsule (80 mg total) by mouth daily. 05/11/18   Minna Merritts, MD  rosuvastatin (CRESTOR) 10 MG tablet Take 1 tablet (10 mg total) by mouth daily. 12/15/18   [name], FNP  traMADol (ULTRAM) 50 MG tablet Take 1 tablet (50 mg total) by mouth every 12 (twelve) hours as needed for severe pain. 12/15/18   [name], FNP      Allergies Latex   Family History  Problem Relation Age of Onset  . Depression Mother   . Pancreatic cancer Mother   . Colon cancer Father   . Breast cancer Sister 28  . Diabetes Brother   . Breast cancer Cousin   . Neuropathy Neg Hx     Social History Social History   Tobacco Use  . Smoking status: Former Smoker    Packs/day: 1.00    Years: 40.00    Pack years: 40.00    Last attempt to quit: 12/16/2011    Years since quitting: 7.1  . Smokeless tobacco: Never Used  Substance Use Topics  . Alcohol use: Not Currently    Alcohol/week: 0.0 standard drinks    Comment: Occasionally has a drink  . Drug use: No    Review of Systems  Constitutional:   No fever or chills.  ENT:   No sore throat. No rhinorrhea. Cardiovascular:   No chest pain or syncope. Respiratory:   No dyspnea or cough. Gastrointestinal:  Positive as above for abdominal pain and constipation without vomiting and diarrhea.  Musculoskeletal:   Negative for focal pain or swelling All other systems reviewed and are negative except as documented above in ROS and HPI.  ____________________________________________   PHYSICAL EXAM:  VITAL SIGNS: ED Triage Vitals  Enc Vitals Group     BP 02/19/19 0626 113/73     Pulse Rate 02/19/19 0626 (!) 112     Resp 02/19/19 0626 19     Temp 02/19/19 0626 98.4 F (36.9 C)     Temp Source 02/19/19 0626 Oral     SpO2 02/19/19 0626 97 %     Weight --      Height --      Head Circumference --      Peak Flow --      Pain Score 02/19/19 0627 9     Pain Loc --      Pain Edu? --      Excl. in [place]? --     Vital signs reviewed, nursing assessments reviewed.   Constitutional:   Alert and oriented. Non-toxic appearance.  Obese Eyes:   Conjunctivae are normal. EOMI. PERRL. ENT      Head:   Normocephalic and atraumatic.      Nose:   No congestion/rhinnorhea.       Mouth/Throat:   MMM, no pharyngeal erythema. No peritonsillar mass.       Neck:   No meningismus.  Full ROM. Hematological/Lymphatic/Immunilogical:   No cervical lymphadenopathy. Cardiovascular:   Irregularly irregular rhythm, rate controlled 100-1 10. Symmetric bilateral radial and DP pulses.  No murmurs. Cap refill less than 2 seconds. Respiratory:   Normal respiratory effort without tachypnea/retractions. Breath sounds are clear and equal bilaterally. No wheezes/rales/rhonchi. Gastrointestinal:   Soft with generalized tenderness worse on the left side. Non distended. There is no CVA tenderness.  No rebound, rigidity, or guarding. Musculoskeletal:   Normal range of motion in all extremities. No joint effusions.  No lower extremity tenderness.  No edema. Neurologic:   Normal speech and language.  Motor grossly intact. No acute focal neurologic deficits are appreciated.  Skin:    Skin is warm, dry and intact. No rash noted.  No petechiae, purpura, or bullae.  ____________________________________________    LABS (pertinent positives/negatives) (all labs ordered are listed, but only abnormal results are displayed) Labs Reviewed  URINE CULTURE  COMPREHENSIVE METABOLIC PANEL  LIPASE, BLOOD  CBC WITH DIFFERENTIAL/PLATELET  URINALYSIS, COMPLETE (UACMP) WITH MICROSCOPIC   ____________________________________________   EKG    ____________________________________________    RADIOLOGY  No results found.  ____________________________________________   PROCEDURES Procedures  ____________________________________________  DIFFERENTIAL DIAGNOSIS   Diverticulitis, bowel obstruction, bowel perforation, pancreatitis, intra-abdominal abscess.  Doubt AAA, dissection, mesenteric ischemia.  CLINICAL IMPRESSION / ASSESSMENT AND PLAN / ED COURSE  Medications ordered in the ED: Medications  morphine 4 MG/ML injection 4 mg (has no administration in time range)  ondansetron (ZOFRAN) injection 4 mg (has no administration in time range)    Pertinent labs & imaging results that were  available during my care of the patient were reviewed by me and considered in my medical decision making (see chart for details).    Patient presents with worsening generalized abdominal pain for the past 2 days.  She has significant tenderness on exam.  Vital signs are unremarkable, A. fib is rate controlled, but due to her comorbidities and age, she will need labs and a CT scan to further evaluate.  Care of the  patient will be signed out to the oncoming physician at 7:00 to follow-up on results.      ____________________________________________   FINAL CLINICAL IMPRESSION(S) / ED DIAGNOSES    Final diagnoses:  Generalized abdominal pain     ED Discharge Orders    None      Portions of this note were generated with dragon dictation software. Dictation errors may occur despite best attempts at proofreading.   Carrie Mew, MD 02/19/19 [phone]

## 2019-02-19 NOTE — Anesthesia Preprocedure Evaluation (Signed)
Anesthesia Evaluation  Patient identified by MRN, date of birth, ID band Patient awake    Reviewed: Allergy & Precautions, NPO status , Patient's Chart, lab work & pertinent test results, reviewed documented beta blocker date and time   Airway Mallampati: III  TM Distance: >3 FB     Dental  (+) Chipped   Pulmonary shortness of breath, COPD, former smoker,           Cardiovascular + Peripheral Vascular Disease  + dysrhythmias Atrial Fibrillation      Neuro/Psych PSYCHIATRIC DISORDERS Anxiety Depression    GI/Hepatic   Endo/Other    Renal/GU      Musculoskeletal  (+) Arthritis ,   Abdominal   Peds  Hematology  (+) anemia ,   Anesthesia Other Findings Obese. HR 114. EF 50. On eliquis - reversal. Has had ablation in the past. Low sats 86-91-95%. Hypotensive 78/68.  Reproductive/Obstetrics                             Anesthesia Physical Anesthesia Plan  ASA: IV  Anesthesia Plan: General   Post-op Pain Management:    Induction: Intravenous  PONV Risk Score and Plan:   Airway Management Planned: Oral ETT  Additional Equipment:   Intra-op Plan:   Post-operative Plan:   Informed Consent: I have reviewed the patients History and Physical, chart, labs and discussed the procedure including the risks, benefits and alternatives for the proposed anesthesia with the patient or authorized representative who has indicated his/her understanding and acceptance.       Plan Discussed with: CRNA  Anesthesia Plan Comments:         Anesthesia Quick Evaluation

## 2019-02-19 NOTE — Progress Notes (Signed)
Family Meeting Note  Advance Directive:no  Today a meeting took place with the Patient.  The following clinical team members were present during this meeting:MD  The following were discussed:Patient's diagnosis: ,atrial fib ACute appendicitis patient's progosis: > 12 months and Goals for treatment: Full Code  Additional follow-up to be provided: chaplain consult to create AD Cousin is POA  Time spent during discussion:16 minutes  Brandy Armstrong, Ulice Bold, MD

## 2019-02-19 NOTE — Consult Note (Signed)
Medical Consultation  YUMALAY CIRCLE WUJ:[id number] DOB: 01/13/1946 DOA: 02/19/2019 PCP: [name], FNP   Requesting physician: dr Peyton Najjar Date of consultation: 02/19/2019 Reason for consultation: atrial fib  Impression/Recommendations  73 year old female with permanent atrial fibrillation and chronic diastolic heart failure followed by Dr. Rockey Situ who presents to the emergency room due to abdominal pain and found to have acute appendicitis and atrial fibrillation with RVR.  1.  Atrial fibrillation with RVR: Stop propanolol for now Diltiazem 10 mg IV x1 then diltiazem drip if needed Check TSH and echocardiogram Last echocardiogram 2017 showing normal ejection fraction Consult placed for Dr. Rockey Situ via epic Hold Eliquis for planned procedure and consider heparin gtt Further recommendations after cardiology evaluation   2.  Acute appendicitis: Patient at moderate risk for moderate risk procedure Cardiology consultation for clearance  3 COPD without signs of exacerbation  4.  Rheumatoid arthritis: Continue outpatient regimen  5.  Hypokalemia: Replete and recheck in a.m.  Chief Complaint: Nominal pain  HPI:  73 year old female with a history of chronic diastolic heart failure and atrial fibrillation, permanent who presented to the emergency room due to abdominal pain for the past few days.  She was found to have acute appendicitis.  She is also found to have atrial fibrillation with RVR.  Hospital service was consulted due to atrial fibrillation with RVR.  She is currently on Eliquis for anticoagulation.  She is followed by Dr. Rockey Situ.  Review of Systems  Constitutional: Negative for fever, chills weight loss HENT: Negative for ear pain, nosebleeds, congestion, facial swelling, rhinorrhea, neck pain, neck stiffness and ear discharge.   Respiratory: Negative for cough, shortness of breath, wheezing  Cardiovascular: Negative for chest pain, palpitations and leg swelling.   Gastrointestinal: Positive right lower quadrant abdominal pain with nausea and vomiting  genitourinary: Negative for dysuria, urgency, frequency, hematuria Musculoskeletal: Negative for back pain or joint pain Neurological: Negative for dizziness, seizures, syncope, focal weakness,  numbness and headaches.  Hematological: Does  bruise/bleed easily.  Psychiatric/Behavioral: Negative for hallucinations, confusion, dysphoric mood   Past Medical History:  Diagnosis Date  . Atrial fibrillation [facility])    a. s/p ablation 01/2012 in [facility] by Dr. Boyd Kerbs;  b. On sotalol & Xarelto;  c. 02/2014 Echo: EF 50-55%, mild conc LVH, nl LA size/structure;  d. Recurrent afib 8/15 & 08/29/2014.  . Barretts esophagus   . Chest pain    a. 02/2014 Myoview: Ef 50%, no ischemia.  . Colon polyps   . COPD (chronic obstructive pulmonary disease) ([place])   . COPD (chronic obstructive pulmonary disease) ([place])   . Depression with anxiety   . GERD (gastroesophageal reflux disease)   . Neuropathy   . Rectal fistula   . Rheumatoid arthritis ([place])    On methotrexate and orencia  . Urinary incontinence   . Vitamin D deficiency    Past Surgical History:  Procedure Laterality Date  . ABDOMINAL HYSTERECTOMY  1992  . CARDIAC ELECTROPHYSIOLOGY STUDY AND ABLATION  2013  . CHOLECYSTECTOMY  1987  . COLONOSCOPY N/A 05/08/2015   Procedure: COLONOSCOPY;  Surgeon: Manya Silvas, MD;  Location: [facility] ENDOSCOPY;  Service: Endoscopy;  Laterality: N/A;  . ESOPHAGOGASTRODUODENOSCOPY N/A 05/06/2015   Procedure: ESOPHAGOGASTRODUODENOSCOPY (EGD);  Surgeon: Lollie Sails, MD;  Location: [place] ENDOSCOPY;  Service: Endoscopy;  Laterality: N/A;  . OOPHORECTOMY    . oophrectomy Bilateral 1992   Social History:  reports that she quit smoking about 7 years ago. She has a 40.00 pack-year smoking history.  She has never used smokeless tobacco. She reports previous alcohol use. She reports that she does not use drugs.  Allergies  Allergen  Reactions  . Latex     Itching Itching   Family History  Problem Relation Age of Onset  . Depression Mother   . Pancreatic cancer Mother   . Colon cancer Father   . Breast cancer Sister 31  . Diabetes Brother   . Breast cancer Cousin   . Neuropathy Neg Hx     Prior to Admission medications   Medication Sig Start Date End Date Taking? Authorizing Provider  ELIQUIS 5 MG TABS tablet Take 1 tablet (5 mg total) by mouth 2 (two) times daily. 05/11/18  Yes Minna Merritts, MD  esomeprazole (NEXIUM) 20 MG capsule Take 20 mg by mouth daily at 12 noon.   Yes [provider]  Fluticasone-Umeclidin-Vilant (TRELEGY ELLIPTA) 100-62.5-25 MCG/INH AEPB Inhale 1 puff into the lungs daily. 01/07/19  Yes Wilhelmina Mcardle, MD  gabapentin (NEURONTIN) 300 MG capsule Take 1 capsule (300 mg total) by mouth 3 (three) times daily. 02/10/19  Yes Arnett, Yvetta Coder, FNP  HUMIRA PEN 40 MG/0.4ML PNKT Inject 40 mg as directed. Every 2 weeks 07/07/18  Yes [provider]  hydroxychloroquine (PLAQUENIL) 200 MG tablet Take 200 mg by mouth 2 (two) times daily.  06/21/16  Yes [provider]  propranolol ER (INDERAL LA) 80 MG 24 hr capsule Take 1 capsule (80 mg total) by mouth daily. 05/11/18  Yes Gollan, Kathlene November, MD  rosuvastatin (CRESTOR) 10 MG tablet Take 1 tablet (10 mg total) by mouth daily. 12/15/18  Yes Arnett, Yvetta Coder, FNP  albuterol (PROVENTIL HFA;VENTOLIN HFA) 108 (90 Base) MCG/ACT inhaler Inhale 1-2 puffs into the lungs every 4 (four) hours as needed for wheezing or shortness of breath. 01/01/18   Wilhelmina Mcardle, MD  furosemide (LASIX) 20 MG tablet Take 1 tablet (20 mg total) by mouth daily as needed. 02/05/19   Minna Merritts, MD  ibuprofen (ADVIL,MOTRIN) 400 MG tablet Take 400 mg by mouth every 4 (four) hours as needed.  09/28/18   [provider]  lidocaine (XYLOCAINE) 5 % ointment Apply 1 application topically as needed.    [provider]  potassium chloride  (K-DUR) 10 MEQ tablet Take 1 tablet (10 mEq total) by mouth daily as needed. 02/05/19   Minna Merritts, MD  traMADol (ULTRAM) 50 MG tablet Take 1 tablet (50 mg total) by mouth every 12 (twelve) hours as needed for severe pain. 12/15/18   [name], FNP    Physical Exam: Blood pressure 136/80, pulse (!) 115, temperature 98.4 F (36.9 C), temperature source Oral, resp. rate 18, SpO2 94 %. @VITALS2 @ There were no vitals filed for this visit. No intake or output data in the 24 hours ending 02/19/19 1041   Constitutional: Appears well-developed and well-nourished. No distress. HENT: Normocephalic. [name] Oropharynx is clear and moist.  Eyes: Conjunctivae and EOM are normal. PERRLA, no scleral icterus.  Neck: Normal ROM. Neck supple. No JVD. No tracheal deviation. CVS: Cardiac, irregular irregular, S1/S2 +, no murmurs, no gallops, no carotid bruit.  Pulmonary: Effort and breath sounds normal, no stridor, rhonchi, wheezes, rales.  Abdominal: Patient with tenderness right lower quadrant no rebound or guarding.  Musculoskeletal: Normal range of motion. No edema and no tenderness.  Neuro: Alert. CN 2-12 grossly intact. No focal deficits. Skin: Skin is warm and dry. No rash noted. Psychiatric: Normal mood and affect.  Labs  Basic Metabolic Panel: Recent Labs  Lab 02/19/19 0633  NA 138  K 3.3*  CL 102  CO2 27  GLUCOSE 138*  BUN 11  CREATININE 0.80  CALCIUM 8.8*   Liver Function Tests: Recent Labs  Lab 02/19/19 0633  AST 22  ALT 11  ALKPHOS 68  BILITOT 0.7  PROT 6.6  ALBUMIN 3.3*   Recent Labs  Lab 02/19/19 0633  LIPASE 41    CBC: Recent Labs  Lab 02/19/19 0633  WBC 9.0  NEUTROABS 7.1  HGB 12.4  HCT 39.9  MCV 89.5  PLT 213   Cardiac Enzymes: No results for input(s): CKTOTAL, CKMB, CKMBINDEX, TROPONINI in the last 168 hours. BNP: Invalid input(s): POCBNP CBG: No results for input(s): GLUCAP in the last 168 hours.  Radiological Exams: Ct Abdomen  Pelvis W Contrast  Result Date: 02/19/2019 CLINICAL DATA:  At EXAM: CT ABDOMEN AND PELVIS WITH CONTRAST TECHNIQUE: Multidetector CT imaging of the abdomen and pelvis was performed using the standard protocol following bolus administration of intravenous contrast. CONTRAST:  163mL OMNIPAQUE IOHEXOL 300 MG/ML  SOLN COMPARISON:  CT abdomen and pelvis 01/20/2011 FINDINGS: Lower chest: Mild dependent atelectasis is present. Lungs are otherwise clear. Heart is enlarged. There is no pleural or pericardial effusion. Hepatobiliary: No focal liver abnormality is seen. Status post cholecystectomy. No biliary dilatation. Pancreas: Unremarkable. No pancreatic ductal dilatation or surrounding inflammatory changes. Spleen: Normal in size without focal abnormality. Adrenals/Urinary Tract: The adrenal glands are normal bilaterally. Focal mass lesion is present. There is no stone. No urinary obstruction is present. Ureters are within normal limits. The urinary bladder is unremarkable. Stomach/Bowel: The stomach and duodenum are within normal limits. Small bowel is unremarkable. Terminal ileum is within normal limits. There is marked dilation of the appendix, measuring up to 17 mm. Obstructing appendicolith measures 11 mm. Diffuse inflammatory changes are present. A small amount of free fluid is noted. Diverticular changes are present in the ascending and transverse colon. Diverticular changes are present throughout the descending and sigmoid colon. No other foci of inflammatory changes present. The rectum is normal. Vascular/Lymphatic: Atherosclerotic calcifications are present in the distal aorta. Maximal transverse diameter is 20 mm. Reproductive: Status post hysterectomy. No adnexal masses. Other: Musculoskeletal: Vertebral body heights alignment are maintained. Mild degenerative changes are noted at the thoracolumbar spine. No focal lytic or blastic lesions are present. Pelvis normal. Hips are within normal limits. IMPRESSION:  1. Acute appendicitis. The appendix is enlarged, to 17 mm. This either represents enlarged pattern of possibly contain abscess. I favor the former. 2. Obstructing appendicolith. 3. Small free fluid. Electronically Signed   By: San Morelle M.D.   On: 02/19/2019 08:06    EKG: Atrial fibrillation RVR long QTC  Thank you for allowing me to participate in the care of your patient. We will continue to follow.   Note: This dictation was prepared with Dragon dictation along with smaller phrase technology. Any transcriptional errors that result from this process are unintentional.  Time spent: 45 minutes  Elandra Powell, MD

## 2019-02-19 NOTE — ED Notes (Signed)
Pt no longer unable to answer orientation questions. Dr Alfred Levins is aware and has spoken with dr mody and paging surgeon.

## 2019-02-19 NOTE — Consult Note (Signed)
ANTICOAGULATION CONSULT NOTE - Initial Consult  Pharmacy Consult for heparin drip management Indication: atrial fibrillation  Patient Measurements:   Heparin Dosing Weight: 82 kg  Vital Signs: Temp: 98.4 F (36.9 C) (02/21 0626) Temp Source: Oral (02/21 0626) BP: 115/87 (02/21 1300) Pulse Rate: 105 (02/21 1300)  Labs: Recent Labs    02/19/19 0633  HGB 12.4  HCT 39.9  PLT 213  CREATININE 0.80    Estimated Creatinine Clearance: 78.9 mL/min (by C-G formula based on SCr of 0.8 mg/dL).   Medical History: Past Medical History:  Diagnosis Date  . Barretts esophagus   . Chest pain    a. 02/2014 Myoview: Ef 50%, no ischemia.  . Colon polyps   . COPD (chronic obstructive pulmonary disease) ([place])   . Depression with anxiety   . GERD (gastroesophageal reflux disease)   . Neuropathy   . Permanent atrial fibrillation    a. s/p ablation 01/2012 in [facility] by Dr. Boyd Kerbs;  b. On sotalol & Xarelto;  c. 02/2014 Echo: EF 50-55%, mild conc LVH, nl LA size/structure;  d. Recurrent afib 8/15 & 08/29/2014.  [name] Rectal fistula   . Rheumatoid arthritis ([place])    On methotrexate and orencia  . Urinary incontinence   . Vitamin D deficiency     Medications:  Scheduled:  . fluticasone furoate-vilanterol  1 puff Inhalation Daily   And  . umeclidinium bromide  1 puff Inhalation Daily  . gabapentin  300 mg Oral TID  . potassium chloride  40 mEq Oral Once    Assessment: 73 year old female with permanent atrial fibrillation and chronic diastolic heart failure followed by Dr. Rockey Situ who presents to the emergency room due to abdominal pain and found to have acute appendicitis and atrial fibrillation with RVR. She is on apixaban PTA with last dose 02/20 in the evening based on my conversation with the patient. Baseline aPTT and HL have been ordered. CBC today wnl  Goal of Therapy:  Heparin level 0.3-0.7 units/ml aPTT 66-102s Monitor platelets by anticoagulation protocol: Yes   Plan:  Give  4000 units bolus x 1 Start heparin infusion at 1250 units/hr Check aPTT level in 6 hours. We will need to follow aPTT until aPTT and HL correlate. Continue to monitor H&H and platelets  Dallie Piles, PharmD 02/19/2019,2:48 PM

## 2019-02-19 NOTE — ED Provider Notes (Addendum)
-----------------------------------------   8:27 AM on 02/19/2019 -----------------------------------------  CT consistent with acute appendicitis.  I discussed the patient with Dr. Peyton Najjar of surgery who will be down to see the patient shortly.  Patient is aware, n.p.o.  Had a small drink this morning at 5:30 AM has not eaten since yesterday.   Harvest Dark, MD 02/19/19 0827  EKG viewed and interpreted by myself appears show atrial fibrillation 125 bpm, narrow QRS, left axis deviation, prolonged QTC of 611.  Nonspecific ST changes without ST elevation.  Record review shows chronic A. fib.    Harvest Dark, MD 02/19/19 1014

## 2019-02-19 NOTE — H&P (Signed)
SURGICAL CONSULTATION NOTE   HISTORY OF PRESENT ILLNESS (HPI):  73 y.o. female presented to [facility] ED for evaluation of the middle pain since 2 days ago. Patient reports she started feeling generalized abdominal pain that thought it was gas but the pain continued to get worse.  She refers associated nausea.  Denies any fever chills.  Pain now is localized more to the lower quadrants.  Pain does not radiate to other part of the body.  Movement of her abdomen is makes the pain worse.  There is no alleviating factor.  She had previous open cholecystectomy.  At the ED a CT scan was done and she was found with an enlarged appendix with an appendicolith.  There is no leukocytosis.  There is no fever.  I personally evaluated the images.  Has a history of atrial fibrillation and is currently on Eliquis.  Her last Eliquis dose was yesterday night.  Surgery is consulted by Dr. Kerman Passey in this context for evaluation and management of acute appendicitis.  PAST MEDICAL HISTORY (PMH):  Past Medical History:  Diagnosis Date  . Atrial fibrillation [facility])    a. s/p ablation 01/2012 in [facility] by Dr. Boyd Kerbs;  b. On sotalol & Xarelto;  c. 02/2014 Echo: EF 50-55%, mild conc LVH, nl LA size/structure;  d. Recurrent afib 8/15 & 08/29/2014.  . Barretts esophagus   . Chest pain    a. 02/2014 Myoview: Ef 50%, no ischemia.  . Colon polyps   . COPD (chronic obstructive pulmonary disease) ([place])   . COPD (chronic obstructive pulmonary disease) ([place])   . Depression with anxiety   . GERD (gastroesophageal reflux disease)   . Neuropathy   . Rectal fistula   . Rheumatoid arthritis ([place])    On methotrexate and orencia  . Urinary incontinence   . Vitamin D deficiency      PAST SURGICAL HISTORY ([place]):  Past Surgical History:  Procedure Laterality Date  . ABDOMINAL HYSTERECTOMY  1992  . CARDIAC ELECTROPHYSIOLOGY STUDY AND ABLATION  2013  . CHOLECYSTECTOMY  1987  . COLONOSCOPY N/A 05/08/2015   Procedure:  COLONOSCOPY;  Surgeon: Manya Silvas, MD;  Location: [facility] ENDOSCOPY;  Service: Endoscopy;  Laterality: N/A;  . ESOPHAGOGASTRODUODENOSCOPY N/A 05/06/2015   Procedure: ESOPHAGOGASTRODUODENOSCOPY (EGD);  Surgeon: Lollie Sails, MD;  Location: [facility] ENDOSCOPY;  Service: Endoscopy;  Laterality: N/A;  . OOPHORECTOMY    . oophrectomy Bilateral 1992     MEDICATIONS:  Prior to Admission medications   Medication Sig Start Date End Date Taking? Authorizing Provider  ELIQUIS 5 MG TABS tablet Take 1 tablet (5 mg total) by mouth 2 (two) times daily. 05/11/18  Yes Minna Merritts, MD  esomeprazole (NEXIUM) 20 MG capsule Take 20 mg by mouth daily at 12 noon.   Yes [provider]  Fluticasone-Umeclidin-Vilant (TRELEGY ELLIPTA) 100-62.5-25 MCG/INH AEPB Inhale 1 puff into the lungs daily. 01/07/19  Yes Wilhelmina Mcardle, MD  gabapentin (NEURONTIN) 300 MG capsule Take 1 capsule (300 mg total) by mouth 3 (three) times daily. 02/10/19  Yes Arnett, Yvetta Coder, FNP  HUMIRA PEN 40 MG/0.4ML PNKT Inject 40 mg as directed. Every 2 weeks 07/07/18  Yes [provider]  hydroxychloroquine (PLAQUENIL) 200 MG tablet Take 200 mg by mouth 2 (two) times daily.  06/21/16  Yes [provider]  propranolol ER (INDERAL LA) 80 MG 24 hr capsule Take 1 capsule (80 mg total) by mouth daily. 05/11/18  Yes Minna Merritts, MD  rosuvastatin (CRESTOR) 10 MG  tablet Take 1 tablet (10 mg total) by mouth daily. 12/15/18  Yes Arnett, Yvetta Coder, FNP  albuterol (PROVENTIL HFA;VENTOLIN HFA) 108 (90 Base) MCG/ACT inhaler Inhale 1-2 puffs into the lungs every 4 (four) hours as needed for wheezing or shortness of breath. 01/01/18   Wilhelmina Mcardle, MD  furosemide (LASIX) 20 MG tablet Take 1 tablet (20 mg total) by mouth daily as needed. 02/05/19   Minna Merritts, MD  ibuprofen (ADVIL,MOTRIN) 400 MG tablet Take 400 mg by mouth every 4 (four) hours as needed.  09/28/18   [provider]  lidocaine (XYLOCAINE) 5 %  ointment Apply 1 application topically as needed.    [provider]  potassium chloride (K-DUR) 10 MEQ tablet Take 1 tablet (10 mEq total) by mouth daily as needed. 02/05/19   Minna Merritts, MD  traMADol (ULTRAM) 50 MG tablet Take 1 tablet (50 mg total) by mouth every 12 (twelve) hours as needed for severe pain. 12/15/18   [name], FNP     ALLERGIES:  Allergies  Allergen Reactions  . Latex     Itching Itching     SOCIAL HISTORY:  Social History   Socioeconomic History  . Marital status: Single    Spouse name: Not on file  . Number of children: 0  . Years of education: 69  . Highest education level: Not on file  Occupational History  . Occupation: Retired  Scientific laboratory technician  . Financial resource strain: Not on file  . Food insecurity:    Worry: Not on file    Inability: Not on file  . Transportation needs:    Medical: Not on file    Non-medical: Not on file  Tobacco Use  . Smoking status: Former Smoker    Packs/day: 1.00    Years: 40.00    Pack years: 40.00    Last attempt to quit: 12/16/2011    Years since quitting: 7.1  . Smokeless tobacco: Never Used  Substance and Sexual Activity  . Alcohol use: Not Currently    Alcohol/week: 0.0 standard drinks    Comment: Occasionally has a drink  . Drug use: No  . Sexual activity: Never  Lifestyle  . Physical activity:    Days per week: Not on file    Minutes per session: Not on file  . Stress: Not on file  Relationships  . Social connections:    Talks on phone: Not on file    Gets together: Not on file    Attends religious service: Not on file    Active member of club or organization: Not on file    Attends meetings of clubs or organizations: Not on file    Relationship status: Not on file  . Intimate partner violence:    Fear of current or ex partner: Not on file    Emotionally abused: Not on file    Physically abused: Not on file    Forced sexual activity: Not on file  Other Topics Concern  .  Not on file  Social History Narrative   [name] was born and reared in [place]. She attended [employer] and graduated in 1971 with her [place] in Education. 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 She then worked for a [employer] in [place]. She is single. She is currently retired. She loves reading. She loves to spend time with her dog.       Caffeine  use: 2 cups coffee per day   2 glasses iced tea/ day   Right-handed    The patient currently resides (home / rehab facility / nursing home): Home The patient normally is (ambulatory / bedbound): Ambulatory   FAMILY HISTORY:  Family History  Problem Relation Age of Onset  . Depression Mother   . Pancreatic cancer Mother   . Colon cancer Father   . Breast cancer Sister 34  . Diabetes Brother   . Breast cancer Cousin   . Neuropathy Neg Hx      REVIEW OF SYSTEMS:  Constitutional: denies weight loss, fever, chills, or sweats  Eyes: denies any other vision changes, history of eye injury  ENT: denies sore throat, hearing problems  Respiratory: denies shortness of breath, wheezing  Cardiovascular: denies chest pain, palpitations  Gastrointestinal: positive abdominal pain, negative nausea and vomiting Genitourinary: denies burning with urination or urinary frequency Musculoskeletal: denies any other joint pains or cramps  Skin: denies any other rashes or skin discolorations  Neurological: denies any other headache, dizziness, weakness  Psychiatric: denies any other depression, anxiety   All other review of systems were negative   VITAL SIGNS:  Temp:  [98.4 F (36.9 C)] 98.4 F (36.9 C) (02/21 0626) Pulse Rate:  [91-125] 115 (02/21 0938) Resp:  [18-20] 18 (02/21 0938) BP: (98-163)/(64-129) 136/80 (02/21 0938) SpO2:  [91 %-98 %] 94 % (02/21 0938)             INTAKE/OUTPUT:  This shift: No intake/output data recorded.  Last 2 shifts: @IOLAST2SHIFTS @   PHYSICAL  EXAM:  Constitutional:  -- Normal body habitus  -- Awake, alert, and oriented x3  Eyes:  -- Pupils equally round and reactive to light  -- No scleral icterus  Ear, nose, and throat:  -- No jugular venous distension  Pulmonary:  -- No crackles  -- Equal breath sounds bilaterally -- Breathing non-labored at rest Cardiovascular:  -- S1, S2 present  -- No pericardial rubs Gastrointestinal:  -- Abdomen soft, moderate tenderness on right lower quadrant, non-distended, no guarding or rebound tenderness.  There is a large scar on the right upper quadrant from previous open cholecystectomy. -- No abdominal masses appreciated, pulsatile or otherwise  Musculoskeletal and Integumentary:  -- Wounds or skin discoloration: None appreciated -- Extremities: B/L UE and LE FROM, hands and feet warm, no edema  Neurologic:  -- Motor function: intact and symmetric -- Sensation: intact and symmetric   Labs:  CBC Latest Ref Rng & Units 02/19/2019 01/07/2019 11/02/2018  WBC 4.0 - 10.5 K/uL 9.0 8.7 9.0  Hemoglobin 12.0 - 15.0 g/dL 12.4 12.7 10.9(L)  Hematocrit 36.0 - 46.0 % 39.9 41.2 36.3  Platelets 150 - 400 K/uL 213 210 229   CMP Latest Ref Rng & Units 02/19/2019 10/12/2018 06/02/2018  Glucose 70 - 99 mg/dL 138(H) 99 89  BUN 8 - 23 mg/dL 11 12 16   Creatinine 0.44 - 1.00 mg/dL 0.80 0.87 0.97  Sodium 135 - 145 mmol/L 138 139 135  Potassium 3.5 - 5.1 mmol/L 3.3(L) 4.2 3.8  Chloride 98 - 111 mmol/L 102 103 102  CO2 22 - 32 mmol/L 27 32 23  Calcium 8.9 - 10.3 mg/dL 8.8(L) 9.2 9.5  Total Protein 6.5 - 8.1 g/dL 6.6 6.6 7.6  Total Bilirubin 0.3 - 1.2 mg/dL 0.7 0.5 0.5  Alkaline Phos 38 - 126 U/L 68 72 92  AST 15 - 41 U/L 22 17 37  ALT 0 - 44 U/L 11 13 15  Imaging studies:  EXAM: CT ABDOMEN AND PELVIS WITH CONTRAST  TECHNIQUE: Multidetector CT imaging of the abdomen and pelvis was performed using the standard protocol following bolus administration of intravenous contrast.  CONTRAST:  115mL  OMNIPAQUE IOHEXOL 300 MG/ML  SOLN  COMPARISON:  CT abdomen and pelvis 01/20/2011  FINDINGS: Lower chest: Mild dependent atelectasis is present. Lungs are otherwise clear. Heart is enlarged. There is no pleural or pericardial effusion.  Hepatobiliary: No focal liver abnormality is seen. Status post cholecystectomy. No biliary dilatation.  Pancreas: Unremarkable. No pancreatic ductal dilatation or surrounding inflammatory changes.  Spleen: Normal in size without focal abnormality.  Adrenals/Urinary Tract: The adrenal glands are normal bilaterally. Focal mass lesion is present. There is no stone. No urinary obstruction is present. Ureters are within normal limits. The urinary bladder is unremarkable.  Stomach/Bowel: The stomach and duodenum are within normal limits. Small bowel is unremarkable. Terminal ileum is within normal limits. There is marked dilation of the appendix, measuring up to 17 mm. Obstructing appendicolith measures 11 mm. Diffuse inflammatory changes are present. A small amount of free fluid is noted. Diverticular changes are present in the ascending and transverse colon. Diverticular changes are present throughout the descending and sigmoid colon. No other foci of inflammatory changes present. The rectum is normal.  Vascular/Lymphatic: Atherosclerotic calcifications are present in the distal aorta. Maximal transverse diameter is 20 mm.  Reproductive: Status post hysterectomy. No adnexal masses.  Other:  Musculoskeletal: Vertebral body heights alignment are maintained. Mild degenerative changes are noted at the thoracolumbar spine. No focal lytic or blastic lesions are present. Pelvis normal. Hips are within normal limits.  IMPRESSION: 1. Acute appendicitis. The appendix is enlarged, to 17 mm. This either represents enlarged pattern of possibly contain abscess. I favor the former. 2. Obstructing appendicolith. 3. Small free  fluid.   Electronically Signed   By: San Morelle M.D.   On: 02/19/2019 08:06  Assessment/Plan:  74 y.o. female with acute appendicitis, complicated by pertinent comorbidities including atrial fibrillation on anticoagulation, COPD, rheumatoid arthritis and hypertension.  Patient with history, physical exam and images consistent with acute appendicitis. Patient oriented about diagnosis and surgical management as treatment. Patient oriented about goals of surgery and its risk including: bowel injury, infection, abscess, bleeding, leak from cecum, intestinal adhesions, bowel obstruction, fistula, injury to the ureter among others.  Due to patient history of atrial fibrillation on anticoagulation and not having an acute abdomen at this moment I oriented the patient that the risks of bleeding is higher at this moment at the risk of waiting 24 hours to perform the surgery.  I oriented the patient that I will admit her and start IV antibiotic and will follow her closely with physical exams to try to wait until tomorrow for the appendectomy.  She was oriented that the chances of open appendectomy are high in her case due to the risk of bleeding.  Patient also has a previous open cholecystectomy done some additional may be encountered. Patient understood and agreed to proceed with with the admission and planning on having surgery tomorrow tomorrow.  Arnold Long, MD

## 2019-02-19 NOTE — ED Triage Notes (Signed)
Pt here from home with report of general abd pain . x2 days

## 2019-02-19 NOTE — ED Notes (Signed)
Xray at bedside for placement verification

## 2019-02-19 NOTE — ED Notes (Signed)
Cardiology at bedside.

## 2019-02-19 NOTE — ED Notes (Signed)
Patient transported to CT 

## 2019-02-19 NOTE — Transfer of Care (Signed)
Immediate Anesthesia Transfer of Care Note  Patient: LEVIA WALTERMIRE  Procedure(s) Performed: APPENDECTOMY LAPAROSCOPIC converted to open appendectomy (N/A )  Patient Location: ICU  Anesthesia Type:General  Level of Consciousness: sedated and Patient remains intubated per anesthesia plan  Airway & Oxygen Therapy: Patient remains intubated per anesthesia plan  Post-op Assessment: Report given to RN and Post -op Vital signs reviewed and stable  Post vital signs: Reviewed and stable  Last Vitals:  Vitals Value Taken Time  BP    Temp    Pulse    Resp    SpO2      Last Pain:  Vitals:   02/19/19 1547  TempSrc:   PainSc: 10-Worst pain ever      Patients Stated Pain Goal: 1 (78/46/96 2952)  Complications: No apparent anesthesia complications

## 2019-02-19 NOTE — ED Notes (Signed)
Pt placed on hospital bed. Will check on pain medication for patient. New blanket given. NAD. Remains on cardiac monitor. No other needs at this time.

## 2019-02-19 NOTE — ED Notes (Signed)
Pt vomiting again.  Preparing to place NGT

## 2019-02-19 NOTE — Interval H&P Note (Signed)
History and Physical Interval Note:  02/19/2019 6:13 PM  Brandy Armstrong  has presented today for surgery, with the diagnosis of appendicitis  The various methods of treatment have been discussed with the patient and family. After consideration of risks, benefits and other options for treatment, the patient has consented to  Procedure(s): APPENDECTOMY LAPAROSCOPIC (N/A) as a surgical intervention .  The patient's history has been reviewed, patient examined. I have reviewed the patient's chart and labs.  Patient was re-evaluated in the ED and was found with fever, worsening abdominal pain, and altered mental status.  The heart rate was improved with a diltiazem drip.  I discussed her clinical status with her causing Margaretmary Eddy, which is the closest family member.  At the ED the sister was tried to be contacted but was not successful.  Due to the recent Eliquis dose that she took yesterday she was started on [facility] for reversal of anticoagulation.  I discussed with the family about the possible need of admission to CCU and prolonged admission.  Also discussed about the possible of remain intubated for a prolonged time.  I discussed that I wanted to hold the surgery until tomorrow but due to the deterioration of the clinical status will need to do an emergent surgery at this moment.  The causing agreed.  Patient is alert but is not responding properly to questions, so consent was unable to be taken from her.   Herbert Pun

## 2019-02-19 NOTE — ED Notes (Addendum)
Spoke with MD diaz. Patient remains to have vomiting.  New verbal orders received.  Also informed him of diltizem gtt and heparin gtt per cardiology consult  And need for 2a bed instead of medsurg.  Pt remains in afib.

## 2019-02-19 NOTE — ED Notes (Signed)
Pt sleeping. 

## 2019-02-19 NOTE — Progress Notes (Signed)
[facility] Progress Note Patient Name: Brandy Armstrong DOB: 11-25-1946 MRN: [id number]   Date of Service  02/19/2019  HPI/Events of Note  73 year old F with history of permanent atrial fibrillation, prior ablation and cardioversion, maintained on eliquis , prior history of GI bleed, presenting to the hospital of the abdominal pain/appendicitis.  Now s/p lap surgery remains vented.  On camera check the patient is on dilt gtt with HR of 94 and BP of 98/50.  Is on Neo gtt for BP support and propofol for sedation.  Has prn morphine ordered.  MAR reviewed  eICU Interventions  Plan: PCXR ordered to assess tube placement Hold on ABG for now since no h/o of pulm disease Continue with prn morphine and propofol SUP ordered     Intervention Category Evaluation Type: New Patient Evaluation  Glynnis Gavel 02/19/2019, 9:49 PM

## 2019-02-19 NOTE — ED Notes (Signed)
Dr Ferrel Logan at bedside. Attempting to contact next of kin.

## 2019-02-19 NOTE — Consult Note (Addendum)
ANTICOAGULATION CONSULT NOTE - Initial Consult  Pharmacy Consult for heparin drip management Indication: atrial fibrillation  Patient Measurements:   Heparin Dosing Weight: 82 kg  Vital Signs: Temp: 102 F (38.9 C) (02/21 2200) Temp Source: Axillary (02/21 2200) BP: 104/71 (02/21 2215) Pulse Rate: 96 (02/21 2215)  Labs: Recent Labs    02/19/19 0633 02/19/19 1446 02/19/19 2257  HGB 12.4  --   --   HCT 39.9  --   --   PLT 213  --   --   APTT  --  29 31  HEPARINUNFRC  --  3.36*  --   CREATININE 0.80  --   --     Estimated Creatinine Clearance: 78.9 mL/min (by C-G formula based on SCr of 0.8 mg/dL).   Medical History: Past Medical History:  Diagnosis Date  . Barretts esophagus   . Chest pain    a. 02/2014 Myoview: Ef 50%, no ischemia.  . Colon polyps   . COPD (chronic obstructive pulmonary disease) ([place])   . Depression with anxiety   . GERD (gastroesophageal reflux disease)   . Neuropathy   . Permanent atrial fibrillation    a. s/p ablation 01/2012 in [facility] by Dr. Boyd Kerbs;  b. On sotalol & Xarelto;  c. 02/2014 Echo: EF 50-55%, mild conc LVH, nl LA size/structure;  d. Recurrent afib 8/15 & 08/29/2014.  [name] Rectal fistula   . Rheumatoid arthritis ([place])    On methotrexate and orencia  . Urinary incontinence   . Vitamin D deficiency     Medications:  Scheduled:  . chlorhexidine gluconate (MEDLINE KIT)  15 mL Mouth Rinse BID  . fluticasone furoate-vilanterol  1 puff Inhalation Daily   And  . umeclidinium bromide  1 puff Inhalation Daily  . gabapentin  300 mg Oral TID  . heparin  3,000 Units Intravenous Once  . mouth rinse  15 mL Mouth Rinse 10 times per day  . pantoprazole (PROTONIX) IV  40 mg Intravenous Q24H  . potassium chloride  40 mEq Oral Once    Assessment: 73 year old female with permanent atrial fibrillation and chronic diastolic heart failure followed by Dr. Rockey Situ who presents to the emergency room due to abdominal pain and found to have acute  appendicitis and atrial fibrillation with RVR. She is on apixaban PTA with last dose 02/20 in the evening based on my conversation with the patient. Baseline aPTT and HL have been ordered. CBC today wnl  Goal of Therapy:  Heparin level 0.3-0.7 units/ml aPTT 66-102s Monitor platelets by anticoagulation protocol: Yes   Plan:  Give 4000 units bolus x 1 Start heparin infusion at 1250 units/hr Check aPTT level in 6 hours. We will need to follow aPTT until aPTT and HL correlate. Continue to monitor H&H and platelets  2/21 2300 aPTT 31. Per RN surgery wishes to hold anticoagulation for tonight. Will need to revisit tomorrow for possible need to resume.  Eloise Harman, PharmD 02/19/2019,11:44 PM

## 2019-02-20 ENCOUNTER — Inpatient Hospital Stay (HOSPITAL_COMMUNITY)
Admit: 2019-02-20 | Discharge: 2019-02-20 | Disposition: A | Discharge status: Home / Self Care | Payer: Medicare Other | Attending: Internal Medicine | Admitting: Internal Medicine

## 2019-02-20 ENCOUNTER — Encounter: Payer: Self-pay | Admitting: General Surgery

## 2019-02-20 ENCOUNTER — Inpatient Hospital Stay: Payer: Medicare Other

## 2019-02-20 DIAGNOSIS — I4821 Permanent atrial fibrillation: Secondary | ICD-10-CM

## 2019-02-20 DIAGNOSIS — J95821 Acute postprocedural respiratory failure: Secondary | ICD-10-CM

## 2019-02-20 DIAGNOSIS — Z9911 Dependence on respirator [ventilator] status: Secondary | ICD-10-CM

## 2019-02-20 DIAGNOSIS — G9340 Encephalopathy, unspecified: Secondary | ICD-10-CM

## 2019-02-20 DIAGNOSIS — J449 Chronic obstructive pulmonary disease, unspecified: Secondary | ICD-10-CM

## 2019-02-20 DIAGNOSIS — G8918 Other acute postprocedural pain: Secondary | ICD-10-CM

## 2019-02-20 DIAGNOSIS — I4891 Unspecified atrial fibrillation: Secondary | ICD-10-CM

## 2019-02-20 LAB — POTASSIUM: Potassium: 4 mmol/L (ref 3.5–5.1)

## 2019-02-20 LAB — CBC
HCT: 40 % (ref 36.0–46.0)
Hemoglobin: 12.3 g/dL (ref 12.0–15.0)
MCH: 27.9 pg (ref 26.0–34.0)
MCHC: 30.8 g/dL (ref 30.0–36.0)
MCV: 90.7 fL (ref 80.0–100.0)
Platelets: 213 10*3/uL (ref 150–400)
RBC: 4.41 MIL/uL (ref 3.87–5.11)
RDW: 15.3 % (ref 11.5–15.5)
WBC: 24.2 10*3/uL — AB (ref 4.0–10.5)
nRBC: 0 % (ref 0.0–0.2)

## 2019-02-20 LAB — TRIGLYCERIDES: Triglycerides: 3560 mg/dL — ABNORMAL HIGH (ref <150)

## 2019-02-20 LAB — HIV ANTIBODY (ROUTINE TESTING W REFLEX): HIV Screen 4th Generation wRfx: NONREACTIVE

## 2019-02-20 LAB — MRSA PCR SCREENING: MRSA by PCR: NEGATIVE

## 2019-02-20 LAB — ECHOCARDIOGRAM COMPLETE

## 2019-02-20 MED ORDER — FENTANYL CITRATE (PF) 100 MCG/2ML IJ SOLN
25.0000 ug | INTRAMUSCULAR | Status: DC | PRN
  Administered 2019-02-20 – 2019-02-24 (×18): 100 ug via INTRAVENOUS
  Administered 2019-02-24: 50 ug via INTRAVENOUS
  Administered 2019-02-24 – 2019-03-01 (×2): 100 ug via INTRAVENOUS
  Administered 2019-03-02: 25 ug via INTRAVENOUS
  Filled 2019-02-20 (×22): qty 2

## 2019-02-20 MED ORDER — METOPROLOL TARTRATE 5 MG/5ML IV SOLN
2.5000 mg | INTRAVENOUS | Status: DC | PRN
  Administered 2019-02-20: 2.5 mg via INTRAVENOUS
  Administered 2019-02-21 – 2019-02-23 (×2): 5 mg via INTRAVENOUS
  Filled 2019-02-20 (×5): qty 5

## 2019-02-20 MED ORDER — FENTANYL CITRATE (PF) 100 MCG/2ML IJ SOLN
50.0000 ug | Freq: Once | INTRAMUSCULAR | Status: AC
  Administered 2019-02-20: 50 ug via INTRAVENOUS

## 2019-02-20 MED ORDER — POTASSIUM CHLORIDE 2 MEQ/ML IV SOLN
INTRAVENOUS | Status: DC
  Administered 2019-02-20 – 2019-02-22 (×3): via INTRAVENOUS
  Filled 2019-02-20 (×4): qty 1000

## 2019-02-20 MED ORDER — FENTANYL CITRATE (PF) 100 MCG/2ML IJ SOLN
25.0000 ug | INTRAMUSCULAR | Status: DC | PRN
  Administered 2019-02-20: 50 ug via INTRAVENOUS
  Administered 2019-02-20: 25 ug via INTRAVENOUS
  Administered 2019-02-20: 50 ug via INTRAVENOUS
  Filled 2019-02-20 (×4): qty 2

## 2019-02-20 MED ORDER — METOPROLOL TARTRATE 5 MG/5ML IV SOLN
2.5000 mg | Freq: Once | INTRAVENOUS | Status: AC
  Administered 2019-02-20: 2.5 mg via INTRAVENOUS
  Filled 2019-02-20: qty 5

## 2019-02-20 MED ORDER — METOPROLOL TARTRATE 5 MG/5ML IV SOLN
5.0000 mg | Freq: Four times a day (QID) | INTRAVENOUS | Status: DC
  Administered 2019-02-20 – 2019-02-22 (×8): 5 mg via INTRAVENOUS
  Filled 2019-02-20 (×7): qty 5

## 2019-02-20 NOTE — Consult Note (Signed)
ANTICOAGULATION CONSULT NOTE - Initial Consult  Pharmacy Consult for heparin drip management Indication: atrial fibrillation  Patient Measurements:   Heparin Dosing Weight: 82 kg  Vital Signs: Temp: 98.3 F (36.8 C) (02/22 0800) Temp Source: Axillary (02/22 0800) BP: 107/79 (02/22 0800) Pulse Rate: 87 (02/22 0800)  Labs: Recent Labs    02/19/19 0633 02/19/19 1446 02/19/19 2257 02/20/19 0506  HGB 12.4  --   --  12.3  HCT 39.9  --   --  40.0  PLT 213  --   --  213  APTT  --  29 31  --   HEPARINUNFRC  --  3.36*  --   --   CREATININE 0.80  --   --   --     Estimated Creatinine Clearance: 78.9 mL/min (by C-G formula based on SCr of 0.8 mg/dL).   Medical History: Past Medical History:  Diagnosis Date  . Barretts esophagus   . Chest pain    a. 02/2014 Myoview: Ef 50%, no ischemia.  . Colon polyps   . COPD (chronic obstructive pulmonary disease) ([place])   . Depression with anxiety   . GERD (gastroesophageal reflux disease)   . Neuropathy   . Permanent atrial fibrillation    a. s/p ablation 01/2012 in [facility] by Dr. Boyd Kerbs;  b. On sotalol & Xarelto;  c. 02/2014 Echo: EF 50-55%, mild conc LVH, nl LA size/structure;  d. Recurrent afib 8/15 & 08/29/2014.  [name] Rectal fistula   . Rheumatoid arthritis ([place])    On methotrexate and orencia  . Urinary incontinence   . Vitamin D deficiency     Medications:  Scheduled:  . chlorhexidine gluconate (MEDLINE KIT)  15 mL Mouth Rinse BID  . fluticasone furoate-vilanterol  1 puff Inhalation Daily   And  . umeclidinium bromide  1 puff Inhalation Daily  . gabapentin  300 mg Oral TID  . mouth rinse  15 mL Mouth Rinse 10 times per day  . metoprolol tartrate  2.5 mg Intravenous Once  . metoprolol tartrate  5 mg Intravenous Q6H    Assessment: 73 year old female with permanent atrial fibrillation and chronic diastolic heart failure followed by Dr. Rockey Situ who presents to the emergency room due to abdominal pain and found to have acute  appendicitis and atrial fibrillation with RVR. She is on apixaban PTA with last dose 02/20 in the evening based on my conversation with the patient. Baseline aPTT and HL have been ordered. CBC today wnl  Goal of Therapy:  Heparin level 0.3-0.7 units/ml aPTT 66-102s Monitor platelets by anticoagulation protocol: Yes   Plan:  2/22 Per Surgery ok to restart heparin infusion. Will restart heparin infusion @ 1450 units/hr. Recheck aPTT and HL in 8 hours.   CBC ordered with AM labs per protocol.     Pernell Dupre, PharmD, BCPS Clinical Pharmacist 02/20/2019 3:04 PM

## 2019-02-20 NOTE — Progress Notes (Signed)
Notified Dr. Acie Fredrickson of Heart rate ST. See new orders

## 2019-02-20 NOTE — Consult Note (Signed)
PCCM CONSULT NOTE   PT PROFILE: 73 y.o. female pt known to me from office (chronic asthmatic bronchitis) presented to [facility] ED with abdominal pain.  CT of abdomen revealed acute appendicitis.  Underwent laparoscopic appendectomy 2/21.  Remained intubated postprocedure.  HPI:  I met her on the ventilator this morning.  She passed an SBT and was extubated under my supervision.  Post extubation, she was moderately encephalopathic.  Her sensorium has cleared over the course the day.  She has tolerated extubation well and is in no respiratory distress.  She does have chronic atrial fibrillation with mild RVR.  She is complaining of mild-moderate abdominal pain.  Past Medical History:  Diagnosis Date  . Barretts esophagus   . Chest pain    a. 02/2014 Myoview: Ef 50%, no ischemia.  . Colon polyps   . COPD (chronic obstructive pulmonary disease) ([place])   . Depression with anxiety   . GERD (gastroesophageal reflux disease)   . Neuropathy   . Permanent atrial fibrillation    a. s/p ablation 01/2012 in [facility] by Dr. Boyd Kerbs;  b. On sotalol & Xarelto;  c. 02/2014 Echo: EF 50-55%, mild conc LVH, nl LA size/structure;  d. Recurrent afib 8/15 & 08/29/2014.  [name] Rectal fistula   . Rheumatoid arthritis ([place])    On methotrexate and orencia  . Urinary incontinence   . Vitamin D deficiency     Past Surgical History:  Procedure Laterality Date  . ABDOMINAL HYSTERECTOMY  1992  . CARDIAC ELECTROPHYSIOLOGY STUDY AND ABLATION  2013  . CHOLECYSTECTOMY  1987  . COLONOSCOPY N/A 05/08/2015   Procedure: COLONOSCOPY;  Surgeon: Manya Silvas, MD;  Location: [facility] ENDOSCOPY;  Service: Endoscopy;  Laterality: N/A;  . ESOPHAGOGASTRODUODENOSCOPY N/A 05/06/2015   Procedure: ESOPHAGOGASTRODUODENOSCOPY (EGD);  Surgeon: Lollie Sails, MD;  Location: [facility] ENDOSCOPY;  Service: Endoscopy;  Laterality: N/A;  . LAPAROSCOPIC APPENDECTOMY N/A 02/19/2019   Procedure: APPENDECTOMY LAPAROSCOPIC converted to open appendectomy;   Surgeon: Herbert Pun, MD;  Location: [facility] ORS;  Service: General;  Laterality: N/A;  . OOPHORECTOMY    . oophrectomy Bilateral 1992    MEDICATIONS: I have reviewed all medications and confirmed regimen as documented  Social History   Socioeconomic History  . Marital status: Single    Spouse name: Not on file  . Number of children: 0  . Years of education: 10  . Highest education level: Not on file  Occupational History  . Occupation: Retired  Scientific laboratory technician  . Financial resource strain: Not on file  . Food insecurity:    Worry: Not on file    Inability: Not on file  . Transportation needs:    Medical: Not on file    Non-medical: Not on file  Tobacco Use  . Smoking status: Former Smoker    Packs/day: 1.00    Years: 40.00    Pack years: 40.00    Last attempt to quit: 12/16/2011    Years since quitting: 7.1  . Smokeless tobacco: Never Used  Substance and Sexual Activity  . Alcohol use: Not Currently    Alcohol/week: 0.0 standard drinks    Comment: Occasionally has a drink  . Drug use: No  . Sexual activity: Never  Lifestyle  . Physical activity:    Days per week: Not on file    Minutes per session: Not on file  . Stress: Not on file  Relationships  . Social connections:    Talks on phone: Not on file    Gets  together: Not on file    Attends religious service: Not on file    Active member of club or organization: Not on file    Attends meetings of clubs or organizations: Not on file    Relationship status: Not on file  . Intimate partner violence:    Fear of current or ex partner: Not on file    Emotionally abused: Not on file    Physically abused: Not on file    Forced sexual activity: Not on file  Other Topics Concern  . Not on file  Social History Narrative   Ms. Pimenta was born and reared in [place]. She attended [employer] and graduated in 1971 with her [employer] in Education. 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 She then worked for a [employer] in [place]. She is single. She is currently retired. She loves reading. She loves to spend time with her dog.       Caffeine use: 2 cups coffee per day   2 glasses iced tea/ day   Right-handed    Family History  Problem Relation Age of Onset  . Depression Mother   . Pancreatic cancer Mother   . Colon cancer Father   . Breast cancer Sister 66  . Diabetes Brother   . Breast cancer Cousin   . Neuropathy Neg Hx     ROS: Level 5 caveat   Vitals:   02/20/19 0630 02/20/19 0700 02/20/19 0737 02/20/19 0800  BP: (!) 86/66 91/62  107/79  Pulse: 88 81  87  Resp: _0 Temp:    98.3 F (36.8 C)  TempSrc:    Axillary  SpO2: 97% 98% 99% 96%     EXAM:  Gen: WDWN, No overt respiratory distress post extubation HEENT: NCAT, sclera white, oropharynx normal Neck: Supple without LAN, thyromegaly, JVD Lungs: Clear anteriorly without wheezes Cardiovascular: IRIR, mildly tachy, no murmurs noted Abdomen: Soft, nondistended, mildly diffusely tender, diminished BS Ext: without clubbing, cyanosis, edema Neuro: CNs grossly intact, motor and sensory intact Skin: Limited exam, no lesions noted  DATA:   BMP Latest Ref Rng & Units 02/20/2019 02/19/2019 10/12/2018  Glucose 70 - 99 mg/dL - 138(H) 99  BUN 8 - 23 mg/dL - 11 12  Creatinine 0.44 - 1.00 mg/dL - 0.80 0.87  BUN/Creat Ratio 11 - 26 - - -  Sodium 135 - 145 mmol/L - 138 139  Potassium 3.5 - 5.1 mmol/L 4.0 3.3(L) 4.2  Chloride 98 - 111 mmol/L - 102 103  CO2 22 - 32 mmol/L - 27 32  Calcium 8.9 - 10.3 mg/dL - 8.8(L) 9.2    CBC Latest Ref Rng & Units 02/20/2019 02/19/2019 01/07/2019  WBC 4.0 - 10.5 K/uL 24.2(H) 9.0 8.7  Hemoglobin 12.0 - 15.0 g/dL 12.3 12.4 12.7  Hematocrit 36.0 - 46.0 % 40.0 39.9 41.2  Platelets 150 - 400 K/uL 213 213 210    CXR 2/21: Mild LLL atelectasis  I have personally reviewed all chest radiographs reported above including CXRs and CT chest  unless otherwise indicated  IMPRESSION:   Ventilator dependent respiratory failure post laparotomy for acute appendicitis with perforation  Chronic asthmatic bronchitis without wheezing presently  Chronic atrial fibrillation with RVR  Acute encephalopathy  Operative pain   PLAN:  Extubated under my supervision this morning Monitor ICU/SDU Supplemental oxygen as needed Nebulized bronchodilators as needed Minimize sedatives Analgesia: Fentanyl as needed Postop management per  general surgery Management of atrial fibrillation per cardiology   Merton Border, MD PCCM service Mobile [phone] Pager [phone] 02/20/2019 6:04 PM

## 2019-02-20 NOTE — Progress Notes (Signed)
[facility] Day(s): 1.   Post op day(s): 1 Day Post-Op.   Interval History: Patient seen and examined, no acute events or new complaints overnight.  Nurses report no issues during the last night shift.  Vital signs in last 24 hours: [min-max] current  Temp:  [98.1 F (36.7 C)-102 F (38.9 C)] 98.3 F (36.8 C) (02/22 0800) Pulse Rate:  [58-125] 81 (02/22 0700) Resp:  [13-40] 15 (02/22 0700) BP: (69-171)/(40-98) 91/62 (02/22 0700) SpO2:  [86 %-100 %] 99 % (02/22 0737) FiO2 (%):  [40 %-50 %] 40 % (02/22 0737)             Drain: 100 mL (serous-sanguinous)  Physical Exam:  Constitutional: Sedated, no distress Respiratory: breathing non-labored at rest on mechanical ventilation Cardiovascular: Irregular rate and rhythm Gastrointestinal: soft, non-distended, wound dry and clean.  Labs:  CBC Latest Ref Rng & Units 02/20/2019 02/19/2019 01/07/2019  WBC 4.0 - 10.5 K/uL 24.2(H) 9.0 8.7  Hemoglobin 12.0 - 15.0 g/dL 12.3 12.4 12.7  Hematocrit 36.0 - 46.0 % 40.0 39.9 41.2  Platelets 150 - 400 K/uL 213 213 210   CMP Latest Ref Rng & Units 02/20/2019 02/19/2019 10/12/2018  Glucose 70 - 99 mg/dL - 138(H) 99  BUN 8 - 23 mg/dL - 11 12  Creatinine 0.44 - 1.00 mg/dL - 0.80 0.87  Sodium 135 - 145 mmol/L - 138 139  Potassium 3.5 - 5.1 mmol/L 4.0 3.3(L) 4.2  Chloride 98 - 111 mmol/L - 102 103  CO2 22 - 32 mmol/L - 27 32  Calcium 8.9 - 10.3 mg/dL - 8.8(L) 9.2  Total Protein 6.5 - 8.1 g/dL - 6.6 6.6  Total Bilirubin 0.3 - 1.2 mg/dL - 0.7 0.5  Alkaline Phos 38 - 126 U/L - 68 72  AST 15 - 41 U/L - 22 17  ALT 0 - 44 U/L - 11 13    Imaging studies: No new pertinent imaging studies   Assessment/Plan:  73 y.o. female with acute perforated appendicitis 1 Day Post-Op s/p laparoscopic converted to open appendectomy, complicated by pertinent comorbidities including sepsis, atrial fibrillation on anticoagulation, COPD, rheumatoid arthritis.  Patient is recovering slowly.  The  heart rate has significantly improved today with a diltiazem drip.  Her blood pressure continue to be on the lower side but map is over 65.  She still needs the phenylephrine drip.  She is tolerating current ventilatory parameters well.  The hemoglobin is stable which is a sign that she is not actively bleeding.  From my standpoint she can be restarted on therapeutic anticoagulation as per cardiology recommendation.  I agree with current management from the critical care physician team.  I will follow closely and appreciate their assistant in the critical care management.  Arnold Long, MD

## 2019-02-20 NOTE — Progress Notes (Signed)
Progress Note  Patient Name: Brandy Armstrong Date of Encounter: 02/20/2019  Primary Cardiologist: Rockey Situ   Subjective   73 year old female with a history of chronic atrial fibrillation.  She was admitted with an acute appendicitis and was found to have rapid atrial fibrillation.  She was cleared for surgery.  She is now status post appendectomy.  She is still intubated and is on the ventilator.  She is sedated.  Her heart rate is much better controlled.  She is on a very low-dose diltiazem drip.  Inpatient Medications    Scheduled Meds: . chlorhexidine gluconate (MEDLINE KIT)  15 mL Mouth Rinse BID  . fluticasone furoate-vilanterol  1 puff Inhalation Daily   And  . umeclidinium bromide  1 puff Inhalation Daily  . gabapentin  300 mg Oral TID  . mouth rinse  15 mL Mouth Rinse 10 times per day  . pantoprazole (PROTONIX) IV  40 mg Intravenous Q24H  . potassium chloride  40 mEq Oral Once   Continuous Infusions: . sodium chloride 50 mL/hr (02/20/19 0818)  . diltiazem (CARDIZEM) infusion 5 mg/hr (02/20/19 0818)  . famotidine (PEPCID) IV Stopped (02/19/19 2319)  . heparin Stopped (02/19/19 1634)  . phenylephrine (NEO-SYNEPHRINE) Adult infusion 40 mcg/min (02/20/19 0822)  . piperacillin-tazobactam (ZOSYN)  IV 12.5 mL/hr at 02/20/19 4696  . propofol (DIPRIVAN) infusion 40 mcg/kg/min (02/20/19 0628)   PRN Meds: acetaminophen **OR** acetaminophen, albuterol, fentaNYL (SUBLIMAZE) injection, furosemide, HYDROcodone-acetaminophen, ondansetron **OR** ondansetron (ZOFRAN) IV, potassium chloride SA   Vital Signs    Vitals:   02/20/19 0630 02/20/19 0700 02/20/19 0737 02/20/19 0800  BP: (!) 86/66 91/62  107/79  Pulse: 88 81  87  Resp: '15 15  16  '$ Temp:    98.3 F (36.8 C)  TempSrc:    Axillary  SpO2: 97% 98% 99% 96%    Intake/Output Summary (Last 24 hours) at 02/20/2019 0950 Last data filed at 02/20/2019 0800 Gross per 24 hour  Intake 2376.98 ml  Output 1450 ml  Net 926.98 ml   Last  3 Weights 02/10/2019 02/05/2019 01/25/2019  Weight (lbs) 221 lb 3.2 oz 220 lb 4 oz 217 lb 3.2 oz  Weight (kg) 100.336 kg 99.905 kg 98.521 kg      Telemetry    Atrial fib with HR 70-80s  - Personally Reviewed  ECG     rapid atrial fib  - Personally Reviewed  Physical Exam   GEN: middle age female,  Intubated.  Neck: No JVD Cardiac:   Irreg Irreg. [name]  Respiratory: Clear to auscultation bilaterally. GI: Soft, nontender, non-distended  MS: No edema; No deformity. Neuro:  Sedated  Psych: sedated   Labs    Chemistry Recent Labs  Lab 02/19/19 0633 02/20/19 0506  NA 138  --   K 3.3* 4.0  CL 102  --   CO2 27  --   GLUCOSE 138*  --   BUN 11  --   CREATININE 0.80  --   CALCIUM 8.8*  --   PROT 6.6  --   ALBUMIN 3.3*  --   AST 22  --   ALT 11  --   ALKPHOS 68  --   BILITOT 0.7  --   GFRNONAA >60  --   GFRAA >60  --   ANIONGAP 9  --      Hematology Recent Labs  Lab 02/19/19 0633 02/20/19 0506  WBC 9.0 24.2*  RBC 4.46 4.41  HGB 12.4 12.3  HCT 39.9 40.0  MCV  89.5 90.7  MCH 27.8 27.9  MCHC 31.1 30.8  RDW 14.8 15.3  PLT 213 213    Cardiac EnzymesNo results for input(s): TROPONINI in the last 168 hours. No results for input(s): TROPIPOC in the last 168 hours.   BNPNo results for input(s): BNP, PROBNP in the last 168 hours.   DDimer No results for input(s): DDIMER in the last 168 hours.   Radiology    Dg Abdomen 1 View  Result Date: 02/19/2019 CLINICAL DATA:  NG placement EXAM: ABDOMEN - 1 VIEW COMPARISON:  CT 02/19/2019 FINDINGS: NG tube is coiled in the body of the stomach. Normal bowel gas pattern. IMPRESSION: NG coiled in the body of the stomach. Electronically Signed   By: Franchot Gallo M.D.   On: 02/19/2019 17:19   Ct Abdomen Pelvis W Contrast  Result Date: 02/19/2019 CLINICAL DATA:  At EXAM: CT ABDOMEN AND PELVIS WITH CONTRAST TECHNIQUE: Multidetector CT imaging of the abdomen and pelvis was performed using the standard protocol following bolus  administration of intravenous contrast. CONTRAST:  144m OMNIPAQUE IOHEXOL 300 MG/ML  SOLN COMPARISON:  CT abdomen and pelvis 01/20/2011 FINDINGS: Lower chest: Mild dependent atelectasis is present. Lungs are otherwise clear. Heart is enlarged. There is no pleural or pericardial effusion. Hepatobiliary: No focal liver abnormality is seen. Status post cholecystectomy. No biliary dilatation. Pancreas: Unremarkable. No pancreatic ductal dilatation or surrounding inflammatory changes. Spleen: Normal in size without focal abnormality. Adrenals/Urinary Tract: The adrenal glands are normal bilaterally. Focal mass lesion is present. There is no stone. No urinary obstruction is present. Ureters are within normal limits. The urinary bladder is unremarkable. Stomach/Bowel: The stomach and duodenum are within normal limits. Small bowel is unremarkable. Terminal ileum is within normal limits. There is marked dilation of the appendix, measuring up to 17 mm. Obstructing appendicolith measures 11 mm. Diffuse inflammatory changes are present. A small amount of free fluid is noted. Diverticular changes are present in the ascending and transverse colon. Diverticular changes are present throughout the descending and sigmoid colon. No other foci of inflammatory changes present. The rectum is normal. Vascular/Lymphatic: Atherosclerotic calcifications are present in the distal aorta. Maximal transverse diameter is 20 mm. Reproductive: Status post hysterectomy. No adnexal masses. Other: Musculoskeletal: Vertebral body heights alignment are maintained. Mild degenerative changes are noted at the thoracolumbar spine. No focal lytic or blastic lesions are present. Pelvis normal. Hips are within normal limits. IMPRESSION: 1. Acute appendicitis. The appendix is enlarged, to 17 mm. This either represents enlarged pattern of possibly contain abscess. I favor the former. 2. Obstructing appendicolith. 3. Small free fluid. Electronically Signed   By:  CSan MorelleM.D.   On: 02/19/2019 08:06   Dg Chest Port 1 View  Result Date: 02/19/2019 CLINICAL DATA:  ETT placement EXAM: PORTABLE CHEST 1 VIEW COMPARISON:  01/25/2019, 06/01/2015 FINDINGS: Endotracheal tube tip is just above the carina. Esophageal tube tip is below the diaphragm but non included. Interval opacity at the left base. Enlarged cardiomediastinal silhouette likely augmented by rotation. No pneumothorax. IMPRESSION: 1. Endotracheal tube tip just above the carina 2. Esophageal tube tip below the diaphragm but non included 3. Interval atelectasis or aspiration at the left base 4. Interval enlargement of the mediastinal silhouette, likely due to patient rotation and portable technique Electronically Signed   By: KDonavan FoilM.D.   On: 02/19/2019 22:15    Cardiac Studies      Patient Profile     73y.o. female     [place]  1.  Atrial fibrillation: The patient has a history of chronic atrial fibrillation.  She has been on Eliquis.  She is status post ablation and has had several cardioversions. She presented yesterday with acute appendicitis and was noted to have rapid atrial fibrillation.  She is status post appendectomy overnight and now her heart rate is much better controlled. She is still on low-dose diltiazem drip.  I think most of her A. fib with rapid ventricular response was due to to the acute appendicitis.  I think that we can hold the diltiazem drip which will allow her blood pressure to increase slightly.  2.  Hypertension: I suspect this is an effect from anesthesia and her surgery.  We will discontinue the diltiazem drip for now.  Continue support with phenylephrine.  3.  COPD: Plans per medical team.      For questions or updates, please contact [facility] Please consult [email or web address] for contact info under        Signed, Mertie Moores, MD  02/20/2019, 9:50 AM

## 2019-02-20 NOTE — Progress Notes (Signed)
[facility] [place] NAME: Sophina Mitten    MR#:  [id number]  DATE OF BIRTH:  July 16, 1946  SUBJECTIVE:  CHIEF COMPLAINT:   Chief Complaint  Patient presents with  . Abdominal Pain   No new complaint this morning.  Patient extubated already this morning.  Had surgery done due to perforated appendicitis yesterday.  Patient still slightly disoriented post extubation.  REVIEW OF SYSTEMS:  ROS Unobtainable at this time due to underlying medical condition  DRUG ALLERGIES:   Allergies  Allergen Reactions  . Latex     Itching Itching   VITALS:  Blood pressure 107/79, pulse 87, temperature 98.3 F (36.8 C), temperature source Axillary, resp. rate 16, SpO2 96 %. PHYSICAL EXAMINATION:  Physical Exam  Constitutional: She appears well-developed and well-nourished.  HENT:  Head: Normocephalic and atraumatic.  Eyes: Pupils are equal, round, and reactive to light. Conjunctivae and EOM are normal.  Neck: Normal range of motion. Neck supple. No tracheal deviation present. No thyromegaly present.  Cardiovascular: Normal rate and regular rhythm.  Respiratory: Effort normal and breath sounds normal.  GI: Soft. Bowel sounds are normal. There is no abdominal tenderness.  Musculoskeletal: Normal range of motion.        General: No edema.  Neurological: She is alert.   patient just extubated this morning.  Still slightly lethargic.  Full neuro exam not completed.  Skin: Skin is warm. No erythema.  Psychiatric: She has a normal mood and affect. Her behavior is normal.   LABORATORY PANEL:  Female CBC Recent Labs  Lab 02/20/19 0506  WBC 24.2*  HGB 12.3  HCT 40.0  PLT 213   ------------------------------------------------------------------------------------------------------------------ Chemistries  Recent Labs  Lab 02/19/19 0633 02/20/19 0506  NA 138  --   K 3.3* 4.0  CL 102  --   CO2 27  --   GLUCOSE 138*  --   BUN 11  --   CREATININE  0.80  --   CALCIUM 8.8*  --   MG 2.0  --   AST 22  --   ALT 11  --   ALKPHOS 68  --   BILITOT 0.7  --    RADIOLOGY:  Dg Abd 1 View  Result Date: 02/20/2019 CLINICAL DATA:  NG tube placement EXAM: ABDOMEN - 1 VIEW COMPARISON:  02/19/2019 FINDINGS: NG tube tip is in the distal stomach or proximal duodenum. IMPRESSION: NG tube tip distal stomach or proximal duodenum. Electronically Signed   By: Rolm Baptise M.D.   On: 02/20/2019 15:18   Dg Abdomen 1 View  Result Date: 02/19/2019 CLINICAL DATA:  NG placement EXAM: ABDOMEN - 1 VIEW COMPARISON:  CT 02/19/2019 FINDINGS: NG tube is coiled in the body of the stomach. Normal bowel gas pattern. IMPRESSION: NG coiled in the body of the stomach. Electronically Signed   By: Franchot Gallo M.D.   On: 02/19/2019 17:19   Dg Chest Port 1 View  Result Date: 02/19/2019 CLINICAL DATA:  ETT placement EXAM: PORTABLE CHEST 1 VIEW COMPARISON:  01/25/2019, 06/01/2015 FINDINGS: Endotracheal tube tip is just above the carina. Esophageal tube tip is below the diaphragm but non included. Interval opacity at the left base. Enlarged cardiomediastinal silhouette likely augmented by rotation. No pneumothorax. IMPRESSION: 1. Endotracheal tube tip just above the carina 2. Esophageal tube tip below the diaphragm but non included 3. Interval atelectasis or aspiration at the left base 4. Interval enlargement of the mediastinal silhouette, likely due to patient rotation  and portable technique Electronically Signed   By: Donavan Foil M.D.   On: 02/19/2019 22:15   ASSESSMENT AND PLAN:   73 year old female with permanent atrial fibrillation and chronic diastolic heart failure followed by Dr. Rockey Situ who presents to the emergency room due to abdominal pain and found to have acute appendicitis and atrial fibrillation with RVR.  1.  Atrial fibrillation with RVR:  Being managed by cardiologist.  Eliquis was placed on hold due to surgery done yesterday.   Patient currently on IV  metoprolol   Patient status post ablation and has had cardioversions in the past. Last echocardiogram 2017 showing normal ejection fraction Eliquis to be resumed once okay with cardiology and surgery. Patient currently in the ICU.  2.  Acute appendicitis:  Patient status post surgery on 02/19/2019.   Being managed by surgical team.    3 COPD without signs of exacerbation  4.  Rheumatoid arthritis: Continue outpatient regimen  5.  Hypokalemia: Replaced  DVT prophylaxis; SCDs To resume Eliquis once okay with cardiology and surgery service    All the records are reviewed and case discussed with Care Management/Social Worker. Management plans discussed with the patient, family and they are in agreement.  CODE STATUS: Full Code  TOTAL TIME TAKING CARE OF THIS PATIENT: 39 minutes.   More than 50% of the time was spent in counseling/coordination of care: YES  POSSIBLE D/C IN 2-3 DAYS, DEPENDING ON CLINICAL CONDITION.   Aayla Marrocco M.D on 02/20/2019 at 4:14 PM  Between 7am to 6pm - Pager - [phone]  After 6pm go to [email or web address] - Proofreader  [facility]  Office  [phone]  CC: Primary care physician; [name], FNP  Note: This dictation was prepared with Dragon dictation along with smaller phrase technology. Any transcriptional errors that result from this process are unintentional.

## 2019-02-20 NOTE — Anesthesia Postprocedure Evaluation (Signed)
Anesthesia Post Note  Patient: Brandy Armstrong  Procedure(s) Performed: APPENDECTOMY LAPAROSCOPIC converted to open appendectomy (N/A )  Patient location during evaluation: PACU Anesthesia Type: General Level of consciousness: awake and alert Pain management: pain level controlled Vital Signs Assessment: post-procedure vital signs reviewed and stable Respiratory status: spontaneous breathing, nonlabored ventilation, respiratory function stable and patient connected to nasal cannula oxygen Cardiovascular status: blood pressure returned to baseline and stable Postop Assessment: no apparent nausea or vomiting Anesthetic complications: no     Last Vitals:  Vitals:   02/20/19 0130 02/20/19 0145  BP: 97/74 107/74  Pulse: 87 75  Resp: 15 15  Temp:    SpO2: 98% 99%    Last Pain:  Vitals:   02/20/19 0000  TempSrc: Axillary  PainSc:    To ICU intubated.              Robyn Galati S

## 2019-02-20 NOTE — Progress Notes (Signed)
Notified Dr. Alva Garnet of Heart rate ST. No new orders at this time.

## 2019-02-20 NOTE — Consult Note (Signed)
Pharmacy Electrolyte Monitoring Consult:  Pharmacy consulted to assist in monitoring and replacing electrolytes in this 73 year old female with permanent atrial fibrillation and chronic diastolic heart failure followed by Dr. Rockey Situ who presents to the emergency room due to abdominal pain and found to have acute appendicitis and atrial fibrillation with RVR.     Labs:   Potassium (mmol/L)  Date Value  02/20/2019 4.0  01/04/2015 3.8   Magnesium (mg/dL)  Date Value  02/19/2019 2.0  01/03/2015 1.9   Calcium (mg/dL)  Date Value  02/19/2019 8.8 (L)   Calcium, Total (mg/dL)  Date Value  01/04/2015 8.5   Albumin (g/dL)  Date Value  02/19/2019 3.3 (L)  01/02/2016 3.9  08/29/2014 2.6 (L)    Assessment/Plan:  target potassium level of approximately 4 mmol/L.   2/22 K:4.0 this morning. No additional replacement warranted at this time.  Will recheck electrolyte with AM labs and continue to replace as needed.   Pernell Dupre, PharmD, BCPS Clinical Pharmacist 02/20/2019 8:24 AM

## 2019-02-20 NOTE — Progress Notes (Signed)
Per DR. Cintron-Diaz no anticoagulation therapy for patient tonight; hold heparin infusion ordered for tonight by cardiology.

## 2019-02-20 NOTE — Progress Notes (Signed)
Alert with delayed responses, following commands. Dr. Leonidas Romberg aware of Heart rate ST. No new orders at this time.

## 2019-02-20 NOTE — Progress Notes (Signed)
Extubated without complications 

## 2019-02-20 NOTE — Progress Notes (Signed)
*  PRELIMINARY RESULTS* Echocardiogram 2D Echocardiogram has been performed.  Brandy Armstrong 02/20/2019, 9:29 AM

## 2019-02-21 ENCOUNTER — Inpatient Hospital Stay: Payer: Medicare Other

## 2019-02-21 DIAGNOSIS — K3580 Unspecified acute appendicitis: Secondary | ICD-10-CM

## 2019-02-21 DIAGNOSIS — I4891 Unspecified atrial fibrillation: Secondary | ICD-10-CM

## 2019-02-21 LAB — CBC
HCT: 40.7 % (ref 36.0–46.0)
Hemoglobin: 11.8 g/dL — ABNORMAL LOW (ref 12.0–15.0)
MCH: 27.8 pg (ref 26.0–34.0)
MCHC: 29 g/dL — ABNORMAL LOW (ref 30.0–36.0)
MCV: 96 fL (ref 80.0–100.0)
Platelets: 154 10*3/uL (ref 150–400)
RBC: 4.24 MIL/uL (ref 3.87–5.11)
RDW: 15.4 % (ref 11.5–15.5)
WBC: 12.5 10*3/uL — ABNORMAL HIGH (ref 4.0–10.5)
nRBC: 0.4 % — ABNORMAL HIGH (ref 0.0–0.2)

## 2019-02-21 LAB — APTT
aPTT: >160 seconds (ref 24–36)
aPTT: 91 seconds — ABNORMAL HIGH (ref 24–36)
aPTT: 110 seconds — ABNORMAL HIGH (ref 24–36)

## 2019-02-21 LAB — COMPREHENSIVE METABOLIC PANEL
ALT: 10 U/L (ref 0–44)
AST: 19 U/L (ref 15–41)
Albumin: 2.8 g/dL — ABNORMAL LOW (ref 3.5–5.0)
Alkaline Phosphatase: 50 U/L (ref 38–126)
Anion gap: 8 (ref 5–15)
BILIRUBIN TOTAL: 0.8 mg/dL (ref 0.3–1.2)
BUN: 16 mg/dL (ref 8–23)
CO2: 21 mmol/L — ABNORMAL LOW (ref 22–32)
Calcium: 8.5 mg/dL — ABNORMAL LOW (ref 8.9–10.3)
Chloride: 109 mmol/L (ref 98–111)
Creatinine, Ser: 0.77 mg/dL (ref 0.44–1.00)
GFR calc Af Amer: >60 mL/min (ref >60)
GFR calc non Af Amer: >60 mL/min (ref >60)
Glucose, Bld: 116 mg/dL — ABNORMAL HIGH (ref 70–99)
Potassium: 4.3 mmol/L (ref 3.5–5.1)
Sodium: 138 mmol/L (ref 135–145)
Total Protein: 6.1 g/dL — ABNORMAL LOW (ref 6.5–8.1)

## 2019-02-21 LAB — MAGNESIUM: Magnesium: 2.1 mg/dL (ref 1.7–2.4)

## 2019-02-21 LAB — GLUCOSE, CAPILLARY: Glucose-Capillary: 129 mg/dL — ABNORMAL HIGH (ref 70–99)

## 2019-02-21 LAB — HEPARIN LEVEL (UNFRACTIONATED)
Heparin Unfractionated: 1.82 IU/mL — ABNORMAL HIGH (ref 0.30–0.70)
Heparin Unfractionated: 1.39 IU/mL — ABNORMAL HIGH (ref 0.30–0.70)

## 2019-02-21 LAB — URINE CULTURE: Culture: NO GROWTH

## 2019-02-21 MED ORDER — IPRATROPIUM-ALBUTEROL 0.5-2.5 (3) MG/3ML IN SOLN: 3.0000 mL | Freq: Four times a day (QID) | RESPIRATORY_TRACT | Status: DC

## 2019-02-21 MED ORDER — LEVALBUTEROL HCL 0.63 MG/3ML IN NEBU
0.6300 mg | INHALATION_SOLUTION | Freq: Four times a day (QID) | RESPIRATORY_TRACT | Status: DC
  Administered 2019-02-21 – 2019-02-25 (×17): 0.63 mg via RESPIRATORY_TRACT
  Filled 2019-02-21 (×17): qty 3

## 2019-02-21 MED ORDER — IPRATROPIUM BROMIDE 0.02 % IN SOLN
0.5000 mg | Freq: Four times a day (QID) | RESPIRATORY_TRACT | Status: DC
  Administered 2019-02-21 – 2019-02-25 (×17): 0.5 mg via RESPIRATORY_TRACT
  Filled 2019-02-21 (×17): qty 2.5

## 2019-02-21 MED ORDER — HEPARIN (PORCINE) 25000 UT/250ML-% IV SOLN
1000.0000 [IU]/h | INTRAVENOUS | Status: DC
  Administered 2019-02-21: 1250 [IU]/h via INTRAVENOUS
  Administered 2019-02-23 – 2019-02-24 (×2): 1150 [IU]/h via INTRAVENOUS
  Administered 2019-02-25 – 2019-02-26 (×2): 1050 [IU]/h via INTRAVENOUS
  Filled 2019-02-21 (×9): qty 250

## 2019-02-21 MED ORDER — BUDESONIDE 0.25 MG/2ML IN SUSP
0.2500 mg | Freq: Two times a day (BID) | RESPIRATORY_TRACT | Status: DC
  Administered 2019-02-21 – 2019-03-08 (×30): 0.25 mg via RESPIRATORY_TRACT
  Filled 2019-02-21 (×31): qty 2

## 2019-02-21 NOTE — Progress Notes (Signed)
Alert and confused on assessment. Disoriented to time and situation, did not know the current month and could only recall her birthday after being told what month she was born in by family. States that she is in pain, but able to rate pain or describe pain. Will administer PRN Fentanyl, see MAR. Will continue to monitor.

## 2019-02-21 NOTE — Progress Notes (Signed)
PCCM PROGRESS NOTE   PT PROFILE: 73 y.o. female pt known to me from office (chronic asthmatic bronchitis) presented to [facility] ED with abdominal pain.  CT of abdomen revealed acute appendicitis.  Underwent laparoscopic appendectomy 2/21.  Remained intubated postprocedure.  SUBJ: Cognition improved.  No respiratory distress.  Remains in atrial fibrillation (chronic) with rapid rate (108-126/min)   Vitals:   02/21/19 0900 02/21/19 1000 02/21/19 1100 02/21/19 1200  BP: (!) 150/105 (!) 160/116 (!) 139/92 (!) 168/100  Pulse: (!) 113 (!) 116 (!) 152 (!) 114  Resp: 19 18 18 19   Temp:    99.1 F (37.3 C)  TempSrc:    Oral  SpO2: 97% 100% 94% 98%     EXAM:  Gen: NAD, cognition improving HEENT: NCAT, sclerae white Neck: No JVD noted Lungs: No wheezes Cardiovascular: IRIR, tachy, no M Abdomen: Nondistended, diminished bowel sounds, moderately diffusely tender Ext: without clubbing, cyanosis, edema Neuro: No focal deficits Skin: Limited exam, no lesions noted   DATA:   BMP Latest Ref Rng & Units 02/21/2019 02/20/2019 02/19/2019  Glucose 70 - 99 mg/dL 116(H) - 138(H)  BUN 8 - 23 mg/dL 16 - 11  Creatinine 0.44 - 1.00 mg/dL 0.77 - 0.80  BUN/Creat Ratio 11 - 26 - - -  Sodium 135 - 145 mmol/L 138 - 138  Potassium 3.5 - 5.1 mmol/L 4.3 4.0 3.3(L)  Chloride 98 - 111 mmol/L 109 - 102  CO2 22 - 32 mmol/L 21(L) - 27  Calcium 8.9 - 10.3 mg/dL 8.5(L) - 8.8(L)    CBC Latest Ref Rng & Units 02/21/2019 02/20/2019 02/19/2019  WBC 4.0 - 10.5 K/uL 12.5(H) 24.2(H) 9.0  Hemoglobin 12.0 - 15.0 g/dL 11.8(L) 12.3 12.4  Hematocrit 36.0 - 46.0 % 40.7 40.0 39.9  Platelets 150 - 400 K/uL 154 213 213    CXR 2/23: Left basilar atelectasis  I have personally reviewed all chest radiographs reported above including CXRs and CT chest unless otherwise indicated  IMPRESSION:   VDRF pot op - resolved Chronic asthmatic bronchitis without wheezing presently Chronic atrial fibrillation with RVR Acute  encephalopathy, resolving Post - operative pain, controlled   PLAN:  Change to SDU status 02/23 Continue supplemental oxygen as needed Nebulized budesonide ordered Scheduled nebulized bronchodilators Continue fentanyl as needed for pain Postop management per general surgery Management of AFRVR per cardiology   Merton Border, MD PCCM service Mobile [phone] Pager [phone] 02/21/2019 1:45 PM

## 2019-02-21 NOTE — Progress Notes (Signed)
Alert and OX2. Moaning and restless in bed. Pt could not state the current month, year or recent holiday, knew that she was in the hospital and could not state why. Took 3 attempts to state her own first and last name.  States having a pain level of 9 on 0-10 pain scale. PRN med administered see MAR. elevated HR, chronic Afib on assessment,scheduled metroprol given see MAR. Will continue to monitor.

## 2019-02-21 NOTE — Progress Notes (Addendum)
Progress Note  Patient Name: Brandy Armstrong Date of Encounter: 02/21/2019  Primary Cardiologist: Ida Rogue, MD  Subjective   HR's/BP's elevated.  Pt says pain has been ok, though notes worsened abd discomfort with any mvmt or cough.  Denies dyspnea or palpitations.  Inpatient Medications    Scheduled Meds: . chlorhexidine gluconate (MEDLINE KIT)  15 mL Mouth Rinse BID  . fluticasone furoate-vilanterol  1 puff Inhalation Daily   And  . umeclidinium bromide  1 puff Inhalation Daily  . gabapentin  300 mg Oral TID  . metoprolol tartrate  5 mg Intravenous Q6H   Continuous Infusions: . dextrose 5 % lactated ringers with kcl 50 mL/hr at 02/21/19 0700  . famotidine (PEPCID) IV 20 mg (02/20/19 2227)  . heparin 1,250 Units/hr (02/21/19 0525)  . phenylephrine (NEO-SYNEPHRINE) Adult infusion Stopped (02/20/19 1216)  . piperacillin-tazobactam (ZOSYN)  IV 3.375 g (02/21/19 0522)   PRN Meds: acetaminophen **OR** acetaminophen, albuterol, fentaNYL (SUBLIMAZE) injection, HYDROcodone-acetaminophen, metoprolol tartrate, ondansetron **OR** ondansetron (ZOFRAN) IV, potassium chloride SA   Vital Signs    Vitals:   02/21/19 0530 02/21/19 0600 02/21/19 0630 02/21/19 0700  BP: 135/83 (!) 140/94 125/86 (!) 175/117  Pulse: (!) 106 (!) 109 (!) 113 (!) 109  Resp: '14 19 16 20  '$ Temp:      TempSrc:      SpO2: 98% 98% 99% 100%    Intake/Output Summary (Last 24 hours) at 02/21/2019 2703 Last data filed at 02/21/2019 0700 Gross per 24 hour  Intake 1760.56 ml  Output 1370 ml  Net 390.56 ml   There were no vitals filed for this visit.  Physical Exam   GEN: Well nourished, well developed, in no acute distress.  HEENT: Grossly normal.  Neck: Supple, difficult to accurately measure jvp 2/2 girth, no carotid bruits, or masses. Cardiac: ir, ir, tachy, no murmurs, rubs, or gallops. No clubbing, cyanosis.  Trace bilat ankle edema.  Radials/DP/PT 2+ and equal bilaterally.  Respiratory:   Respirations regular and unlabored, diminished breath sounds. GI: Soft, diffusely tender, nondistended, BS + x 4. MS: no deformity or atrophy. Skin: warm and dry, no rash. Neuro:  Strength and sensation are intact. Psych: AAOx3.  Normal affect.  Labs    Chemistry Recent Labs  Lab 02/19/19 [phone] 02/20/19 0506 02/21/19 0443  NA 138  --  138  K 3.3* 4.0 4.3  CL 102  --  109  CO2 27  --  21*  GLUCOSE 138*  --  116*  BUN 11  --  16  CREATININE 0.80  --  0.77  CALCIUM 8.8*  --  8.5*  PROT 6.6  --  6.1*  ALBUMIN 3.3*  --  2.8*  AST 22  --  19  ALT 11  --  10  ALKPHOS 68  --  50  BILITOT 0.7  --  0.8  GFRNONAA >60  --  >60  GFRAA >60  --  >60  ANIONGAP 9  --  8     Hematology Recent Labs  Lab 02/19/19 0633 02/20/19 0506 02/21/19 0443  WBC 9.0 24.2* 12.5*  RBC 4.46 4.41 4.24  HGB 12.4 12.3 11.8*  HCT 39.9 40.0 40.7  MCV 89.5 90.7 96.0  MCH 27.8 27.9 27.8  MCHC 31.1 30.8 29.0*  RDW 14.8 15.3 15.4  PLT 213 213 154     Radiology    Dg Abd 1 View  Result Date: 02/20/2019 CLINICAL DATA:  NG tube placement EXAM: ABDOMEN - 1 VIEW  COMPARISON:  02/19/2019 FINDINGS: NG tube tip is in the distal stomach or proximal duodenum. IMPRESSION: NG tube tip distal stomach or proximal duodenum. Electronically Signed   By: Rolm Baptise M.D.   On: 02/20/2019 15:18   Dg Abdomen 1 View  Result Date: 02/19/2019 CLINICAL DATA:  NG placement EXAM: ABDOMEN - 1 VIEW COMPARISON:  CT 02/19/2019 FINDINGS: NG tube is coiled in the body of the stomach. Normal bowel gas pattern. IMPRESSION: NG coiled in the body of the stomach. Electronically Signed   By: Franchot Gallo M.D.   On: 02/19/2019 17:19   Dg Chest Port 1 View  Result Date: 02/21/2019 CLINICAL DATA:  Respiratory failure EXAM: PORTABLE CHEST 1 VIEW COMPARISON:  02/19/2019 FINDINGS: Endotracheal tube and NG tube removed. Left lower lobe atelectasis/infiltrate unchanged with small left effusion. Right lung remains clear. No edema. IMPRESSION:  Endotracheal tube and NG tube removed No significant change in left lower lobe atelectasis/infiltrate and small left effusion. Electronically Signed   By: Franchot Gallo M.D.   On: 02/21/2019 07:07   Dg Chest Port 1 View  Result Date: 02/19/2019 CLINICAL DATA:  ETT placement EXAM: PORTABLE CHEST 1 VIEW COMPARISON:  01/25/2019, 06/01/2015 FINDINGS: Endotracheal tube tip is just above the carina. Esophageal tube tip is below the diaphragm but non included. Interval opacity at the left base. Enlarged cardiomediastinal silhouette likely augmented by rotation. No pneumothorax. IMPRESSION: 1. Endotracheal tube tip just above the carina 2. Esophageal tube tip below the diaphragm but non included 3. Interval atelectasis or aspiration at the left base 4. Interval enlargement of the mediastinal silhouette, likely due to patient rotation and portable technique Electronically Signed   By: Donavan Foil M.D.   On: 02/19/2019 22:15    Telemetry    Afib - was trending in 80's yesterday AM but rate trended up throughout the day and overnight - now 130's - Personally Reviewed  Cardiac Studies   2D Echocardiogram 2.22.2020   1. The left ventricle has hyperdynamic systolic function, with an ejection fraction of >65%. The cavity size was normal. There is mildly increased left ventricular wall thickness. Left ventricular diastology could not be evaluated secondary to atrial  fibrillation.  2. The right ventricle has normal systolic function. The cavity was normal. There is no increase in right ventricular wall thickness.  3. The mitral valve is normal in structure.  4. The tricuspid valve is normal in structure.  5. The aortic valve is normal in structure. Mild thickening of the aortic valve no stenosis of the aortic valve.  6. The interatrial septum was not assessed. _____________    Patient Profile     73 y/o ? with a hx of now permanent Afib on Eliquis s/p ablation in 2013 with recurrent Afib s/p repeat  ablation in 01/2015 s/p DCCV in 02/2015 s/p DCCV in 08/2016 with recurrent Afib 1 month later leading to the discontinuation of amiodarone and rate control strategy since, aortic atherosclerosis, chronic diastolic CHF, GI bleed on Xarelto, COPD secondary to tobacco abuse quitting in 2012, and anemia, who was admitted 2/21 with acute appendicitis and rapid afib. Now s/p open appendectomy.  Assessment & Plan    1. Acute appendicitis: s/p open appendectomy 2/21. Abx per surgical team.  2.  Atrial fibrillation w/ RVR:  Rates elevated following extubation 2/22. 120's to 130's this AM.  Currently on lopressor '5mg'$  IV q6h.  Not currently taking PO's.  Suspect pain/discomfort driving rates at this point.  Cont  blocker and manage pain.  Could consider IV dilt if rates remain inadequately controlled on  blocker. H/H stable on heparin.  Plan to transition back to eliquis once taking PO's.  3.  HFpEF:  Nl EF by echo 2/22.  +1.3L for admission. Lying flat and appears euvolemic though body habitus makes exam somewhat challenging.  Will need daily weights.  Cont  blocker and pain mgmt for HR/BP. Was on lasix 20 daily prn @ home.  4.  Elevated BP:  BP elevated this AM.  She does not carry a h/o HTN. Cont  blocker.  Signed, [name], NP  02/21/2019, 8:22 AM    For questions or updates, please contact   Please consult [email or web address] for contact info under Cardiology/STEMI.   Attending Note:   The patient was seen and examined.  Agree with assessment and plan as noted above.  Changes made to the above note as needed.  Patient seen and independently examined with Ignacia Bayley, NP .   We discussed all aspects of the encounter. I agree with the assessment and plan as stated above.  1.   Atrial fibrillation with rapid ventricular response. Suspect that a lot of her rapid ventricular response is due to generalized pain and postsurgical state. She is on low-dose metoprolol every 6 hours. Could consider  adding IV diltiazem although diltiazem may be associated with some slight constipation.  I would not want to slow down her bowel mobility at this point.. Continue beta-blocker.  Transition to oral beta-blocker when she is taking p.o.'s.  2.  Essential hypertension: Blood pressure is mildly elevated today.  I suspect this is due to pain. Consider adding medications if needed after she is eating and drinking normally.   I have spent a total of 40 minutes with patient reviewing hospital  notes , telemetry, EKGs, labs and examining patient as well as establishing an assessment and plan that was discussed with the patient. > 50% of time was spent in direct patient care.    Thayer Headings, Brooke Bonito., MD, [facility] 02/21/2019, 10:15 AM [address]  [place] Pager [phone]

## 2019-02-21 NOTE — Progress Notes (Signed)
Pt resting. Alert and confused. Disoriented to time and situation. Responses continue to be delayed.  States  having a pain level of 10 on 0-10 pain scale.Gave PRN Fentanyl, see MAR.  Surgical dressing clean dry and intact. J peg clean dry and intact. Abdomen soft and tender. Will continue to monitor.

## 2019-02-21 NOTE — Consult Note (Signed)
ANTICOAGULATION CONSULT NOTE - Initial Consult  Pharmacy Consult for heparin drip management Indication: atrial fibrillation  Patient Measurements:   Heparin Dosing Weight: 82 kg  Vital Signs: Temp: 99.1 F (37.3 C) (02/23 1200) Temp Source: Oral (02/23 1200) BP: 168/100 (02/23 1200) Pulse Rate: 114 (02/23 1200)  Labs: Recent Labs    02/19/19 4742  02/19/19 1446 02/19/19 2257 02/20/19 0506 02/20/19 2325 02/21/19 0443 02/21/19 1131  HGB 12.4  --   --   --  12.3  --  11.8*  --   HCT 39.9  --   --   --  40.0  --  40.7  --   PLT 213  --   --   --  213  --  154  --   APTT  --    < > 29 31  --  >160*  --  91*  HEPARINUNFRC  --   --  3.36*  --   --  1.82*  --   --   CREATININE 0.80  --   --   --   --   --  0.77  --    < > = values in this interval not displayed.    Estimated Creatinine Clearance: 78.9 mL/min (by C-G formula based on SCr of 0.77 mg/dL).   Medical History: Past Medical History:  Diagnosis Date  . Barretts esophagus   . Chest pain    a. 02/2014 Myoview: Ef 50%, no ischemia.  . Colon polyps   . COPD (chronic obstructive pulmonary disease) ([place])   . Depression with anxiety   . GERD (gastroesophageal reflux disease)   . Neuropathy   . Permanent atrial fibrillation    a. s/p ablation 01/2012 in [facility] by Dr. Boyd Kerbs;  b. On sotalol & Xarelto;  c. 02/2014 Echo: EF 50-55%, mild conc LVH, nl LA size/structure;  d. Recurrent afib 8/15 & 08/29/2014.  [name] Rectal fistula   . Rheumatoid arthritis ([place])    On methotrexate and orencia  . Urinary incontinence   . Vitamin D deficiency     Medications:  Scheduled:  . budesonide (PULMICORT) nebulizer solution  0.25 mg Nebulization Q12H  . chlorhexidine gluconate (MEDLINE KIT)  15 mL Mouth Rinse BID  . gabapentin  300 mg Oral TID  . ipratropium  0.5 mg Nebulization Q6H  . levalbuterol  0.63 mg Nebulization Q6H  . metoprolol tartrate  5 mg Intravenous Q6H    Assessment: 73 year old female with permanent atrial  fibrillation and chronic diastolic heart failure followed by Dr. Rockey Situ who presents to the emergency room due to abdominal pain and found to have acute appendicitis and atrial fibrillation with RVR. She is on apixaban PTA with last dose 02/20 in the evening based on my conversation with the patient. Baseline aPTT and HL have been ordered. CBC today wnl  2/22 Heparin infusion restarted @ 1450 unit/Hr 2/22 PM aPTT >150mHL 1.82, Infusion decreased to 1250 units/hr 2/23 1130 aPTT 91  Goal of Therapy:  Heparin level 0.3-0.7 units/ml aPTT 66-102s Monitor platelets by anticoagulation protocol: Yes   Plan:  2/23 @ 1131 aPTT 91. Will continue current infusion rate of 1250 units/hr.  Recheck aPTT and HL in 8 hours.   CBC ordered with AM labs per protocol.   SPernell Dupre PharmD, BCPS Clinical Pharmacist 02/21/2019 12:58 PM

## 2019-02-21 NOTE — Progress Notes (Signed)
[facility] Day(s): 2.   Post op day(s): 2 Days Post-Op.   Interval History: Patient seen and examined.  Patient able to be extubated yesterday.  Tolerating well.  Her mental status has been on and off.  Refers pain on the surgical area.  There is no sign of nausea or vomiting.  Vital signs in last 24 hours: [min-max] current  Temp:  [97.5 F (36.4 C)-99.8 F (37.7 C)] 97.5 F (36.4 C) (02/23 0800) Pulse Rate:  [40-144] 113 (02/23 0900) Resp:  [14-26] 19 (02/23 0900) BP: (60-177)/(21-117) 150/105 (02/23 0900) SpO2:  [88 %-100 %] 97 % (02/23 0900)             Physical Exam:  Constitutional: alert, cooperative and no distress  Respiratory: breathing non-labored at rest  Cardiovascular: Irregular rate and rhythm Gastrointestinal: soft, moderate-tender, and non-distended.  Wound is dry and clean  Labs:  CBC Latest Ref Rng & Units 02/21/2019 02/20/2019 02/19/2019  WBC 4.0 - 10.5 K/uL 12.5(H) 24.2(H) 9.0  Hemoglobin 12.0 - 15.0 g/dL 11.8(L) 12.3 12.4  Hematocrit 36.0 - 46.0 % 40.7 40.0 39.9  Platelets 150 - 400 K/uL 154 213 213   CMP Latest Ref Rng & Units 02/21/2019 02/20/2019 02/19/2019  Glucose 70 - 99 mg/dL 116(H) - 138(H)  BUN 8 - 23 mg/dL 16 - 11  Creatinine 0.44 - 1.00 mg/dL 0.77 - 0.80  Sodium 135 - 145 mmol/L 138 - 138  Potassium 3.5 - 5.1 mmol/L 4.3 4.0 3.3(L)  Chloride 98 - 111 mmol/L 109 - 102  CO2 22 - 32 mmol/L 21(L) - 27  Calcium 8.9 - 10.3 mg/dL 8.5(L) - 8.8(L)  Total Protein 6.5 - 8.1 g/dL 6.1(L) - 6.6  Total Bilirubin 0.3 - 1.2 mg/dL 0.8 - 0.7  Alkaline Phos 38 - 126 U/L 50 - 68  AST 15 - 41 U/L 19 - 22  ALT 0 - 44 U/L 10 - 11    Imaging studies: No new pertinent imaging studies   Assessment/Plan:  73 y.o. female with acute perforated appendicitis 1 Day Post-Op s/p laparoscopic converted to open appendectomy, complicated by pertinent comorbidities including sepsis, atrial fibrillation on anticoagulation, COPD, rheumatoid  arthritis.  Patient recovering slowly.  Tolerated extubation well.  Today with uncontrolled atrial fibrillation.  There is a significant decrease in leukocytosis and stable hemoglobin.  Patient started on therapeutic anticoagulation.  There is no sign of bleeding.  Difficult to assess patient bowel function but if patient present with rectal gas or bowel movement may start with clear liquid diets.  Patient has worries of having ileus due to the amount of infection and the difficulty of the surgery. Agree with current management by the intensivist care team.  Appreciated cardiology and hospitalist inputs.  Arnold Long, MD

## 2019-02-21 NOTE — Consult Note (Signed)
Pharmacy Electrolyte Monitoring Consult:  Pharmacy consulted to assist in monitoring and replacing electrolytes in this 73 year old female with permanent atrial fibrillation and chronic diastolic heart failure followed by Dr. Rockey Situ who presents to the emergency room due to abdominal pain and found to have acute appendicitis and atrial fibrillation with RVR.     Labs:   Potassium (mmol/L)  Date Value  02/21/2019 4.3  01/04/2015 3.8   Magnesium (mg/dL)  Date Value  02/21/2019 2.1  01/03/2015 1.9   Calcium (mg/dL)  Date Value  02/21/2019 8.5 (L)   Calcium, Total (mg/dL)  Date Value  01/04/2015 8.5   Albumin (g/dL)  Date Value  02/21/2019 2.8 (L)  01/02/2016 3.9  08/29/2014 2.6 (L)    Assessment/Plan:  target potassium level of approximately 4 mmol/L.   2/23 Electrolytes WNL. No additional replacement needed at this time.  Will recheck electrolyte with AM labs and continue to replace as needed.   Pernell Dupre, PharmD, BCPS Clinical Pharmacist 02/21/2019 7:11 AM

## 2019-02-21 NOTE — Progress Notes (Signed)
ANTICOAGULATION CONSULT NOTE - Initial Consult  Pharmacy Consult for Heparin  Indication: atrial fibrillation  Allergies  Allergen Reactions  . Latex     Itching Itching    Patient Measurements:   Heparin Dosing Weight: 82 kg   Vital Signs: Temp: 99.8 F (37.7 C) (02/23 1900) Temp Source: Oral (02/23 1900) BP: 143/102 (02/23 2000) Pulse Rate: 122 (02/23 2000)  Labs: Recent Labs    02/19/19 6283 02/19/19 1446  02/20/19 0506 02/20/19 2325 02/21/19 0443 02/21/19 1131 02/21/19 1944  HGB 12.4  --   --  12.3  --  11.8*  --   --   HCT 39.9  --   --  40.0  --  40.7  --   --   PLT 213  --   --  213  --  154  --   --   APTT  --  29   < >  --  >160*  --  91* 110*  HEPARINUNFRC  --  3.36*  --   --  1.82*  --   --   --   CREATININE 0.80  --   --   --   --  0.77  --   --    < > = values in this interval not displayed.    Estimated Creatinine Clearance: 78.9 mL/min (by C-G formula based on SCr of 0.77 mg/dL).   Medical History: Past Medical History:  Diagnosis Date  . Barretts esophagus   . Chest pain    a. 02/2014 Myoview: Ef 50%, no ischemia.  . Colon polyps   . COPD (chronic obstructive pulmonary disease) ([place])   . Depression with anxiety   . GERD (gastroesophageal reflux disease)   . Neuropathy   . Permanent atrial fibrillation    a. s/p ablation 01/2012 in [facility] by Dr. Boyd Kerbs;  b. On sotalol & Xarelto;  c. 02/2014 Echo: EF 50-55%, mild conc LVH, nl LA size/structure;  d. Recurrent afib 8/15 & 08/29/2014.  [name] Rectal fistula   . Rheumatoid arthritis ([place])    On methotrexate and orencia  . Urinary incontinence   . Vitamin D deficiency     Medications:  Medications Prior to Admission  Medication Sig Dispense Refill Last Dose  . ELIQUIS 5 MG TABS tablet Take 1 tablet (5 mg total) by mouth 2 (two) times daily. 180 tablet 3 02/18/2019 at 0900  . esomeprazole (NEXIUM) 20 MG capsule Take 20 mg by mouth daily at 12 noon.   02/18/2019 at 1200  .  Fluticasone-Umeclidin-Vilant (TRELEGY ELLIPTA) 100-62.5-25 MCG/INH AEPB Inhale 1 puff into the lungs daily. 60 each 2 02/19/2019 at 0530  . gabapentin (NEURONTIN) 300 MG capsule Take 1 capsule (300 mg total) by mouth 3 (three) times daily. 90 capsule 3 02/18/2019 at 2000  . HUMIRA PEN 40 MG/0.4ML PNKT Inject 40 mg as directed. Every 2 weeks   Past Month at Unknown time  . hydroxychloroquine (PLAQUENIL) 200 MG tablet Take 200 mg by mouth 2 (two) times daily.    02/18/2019 at 2000  . propranolol ER (INDERAL LA) 80 MG 24 hr capsule Take 1 capsule (80 mg total) by mouth daily. 90 capsule 3 02/18/2019 at 0800  . rosuvastatin (CRESTOR) 10 MG tablet Take 1 tablet (10 mg total) by mouth daily. 90 tablet 3 02/18/2019 at 2000  . albuterol (PROVENTIL HFA;VENTOLIN HFA) 108 (90 Base) MCG/ACT inhaler Inhale 1-2 puffs into the lungs every 4 (four) hours as needed for wheezing or shortness of breath.  1 Inhaler 10 Unknown at prn  . furosemide (LASIX) 20 MG tablet Take 1 tablet (20 mg total) by mouth daily as needed. 90 tablet 2 Unknown at prn  . ibuprofen (ADVIL,MOTRIN) 400 MG tablet Take 400 mg by mouth every 4 (four) hours as needed.    Unknown at prn  . lidocaine (XYLOCAINE) 5 % ointment Apply 1 application topically as needed.   Unknown at prn  . potassium chloride (K-DUR) 10 MEQ tablet Take 1 tablet (10 mEq total) by mouth daily as needed. 90 tablet 2 Unknown at prn  . traMADol (ULTRAM) 50 MG tablet Take 1 tablet (50 mg total) by mouth every 12 (twelve) hours as needed for severe pain. 30 tablet 0 Unknown at prn    Assessment: 73 year old female with permanent atrial fibrillation and chronic diastolic heart failure followed by Dr. Rockey Situ who presents to the emergency room due to abdominal pain and found to have acute appendicitis and atrial fibrillation with RVR.She is on apixaban PTA with last dose 02/20 in the evening based on my conversation with the patient. Baseline aPTT and HL have been ordered. CBC today  wnl  2/22 Heparin infusion restarted @ 1450 unit/Hr 2/22 PM aPTT >134m HL 1.82, Infusion decreased to 1250 units/hr 2/23 1130 aPTT 91  Goal of Therapy:  Heparin level 0.3-0.7 units/ml aPTT 66-102s Monitor platelets by anticoagulation protocol: Yes   Plan:  2/23 @ 1131 aPTT 91. Will continue current infusion rate of 1250 units/hr.  Recheck aPTT and HL in 8 hours.   2/23: aPTT @ 1944 = 110  Will decrease heparin gtt rate to 1150 units/hr.   Will recheck aPTT 8 hrs after rate change.   CBC ordered with AM labs per protocol.   Gustaf Mccarter D 02/21/2019,8:32 PM

## 2019-02-21 NOTE — Progress Notes (Signed)
2.5mg  Metoprolol given PRN per Dr Alva Garnet. Will continue to monitor.

## 2019-02-21 NOTE — Consult Note (Signed)
ANTICOAGULATION CONSULT NOTE - Initial Consult  Pharmacy Consult for heparin drip management Indication: atrial fibrillation  Patient Measurements:   Heparin Dosing Weight: 82 kg  Vital Signs: Temp: 97.6 F (36.4 C) (02/23 0000) Temp Source: Axillary (02/23 0000) BP: 165/96 (02/23 0100) Pulse Rate: 127 (02/23 0100)  Labs: Recent Labs    02/19/19 4830 02/19/19 1446 02/19/19 2257 02/20/19 0506 02/20/19 2325  HGB 12.4  --   --  12.3  --   HCT 39.9  --   --  40.0  --   PLT 213  --   --  213  --   APTT  --  29 31  --  >160*  HEPARINUNFRC  --  3.36*  --   --  1.82*  CREATININE 0.80  --   --   --   --     Estimated Creatinine Clearance: 78.9 mL/min (by C-G formula based on SCr of 0.8 mg/dL).   Medical History: Past Medical History:  Diagnosis Date  . Barretts esophagus   . Chest pain    a. 02/2014 Myoview: Ef 50%, no ischemia.  . Colon polyps   . COPD (chronic obstructive pulmonary disease) ([place])   . Depression with anxiety   . GERD (gastroesophageal reflux disease)   . Neuropathy   . Permanent atrial fibrillation    a. s/p ablation 01/2012 in [facility] by Dr. Boyd Kerbs;  b. On sotalol & Xarelto;  c. 02/2014 Echo: EF 50-55%, mild conc LVH, nl LA size/structure;  d. Recurrent afib 8/15 & 08/29/2014.  [name] Rectal fistula   . Rheumatoid arthritis ([place])    On methotrexate and orencia  . Urinary incontinence   . Vitamin D deficiency     Medications:  Scheduled:  . chlorhexidine gluconate (MEDLINE KIT)  15 mL Mouth Rinse BID  . fluticasone furoate-vilanterol  1 puff Inhalation Daily   And  . umeclidinium bromide  1 puff Inhalation Daily  . gabapentin  300 mg Oral TID  . metoprolol tartrate  5 mg Intravenous Q6H    Assessment: 73 year old female with permanent atrial fibrillation and chronic diastolic heart failure followed by Dr. Rockey Situ who presents to the emergency room due to abdominal pain and found to have acute appendicitis and atrial fibrillation with RVR. She is on  apixaban PTA with last dose 02/20 in the evening based on my conversation with the patient. Baseline aPTT and HL have been ordered. CBC today wnl  Goal of Therapy:  Heparin level 0.3-0.7 units/ml aPTT 66-102s Monitor platelets by anticoagulation protocol: Yes   Plan:  2/22 Per Surgery ok to restart heparin infusion. Will restart heparin infusion @ 1450 units/hr. Recheck aPTT and HL in 8 hours.   CBC ordered with AM labs per protocol.    2/22 PM aPTT >160, heparin level 1.82. Hold drip x 1 hour and restart at 1250 units/hr. Recheck aPTT 6 hours after restart.  Eloise Harman, PharmD, BCPS Clinical Pharmacist 02/21/2019 4:26 AM

## 2019-02-21 NOTE — Progress Notes (Signed)
[facility] [place] NAME: Brandy Armstrong    MR#:  [id number]  DATE OF BIRTH:  11-Sep-1946  SUBJECTIVE:  CHIEF COMPLAINT:   Chief Complaint  Patient presents with  . Abdominal Pain   No new complaint this morning.  Patient extubated on 02/20/2019 .  Had surgery done due to perforated appendicitis on 02/19/2019.  Patient still slightly lethargic.  Family members updated at bedside.    REVIEW OF SYSTEMS:  ROS Unobtainable at this time due to underlying medical condition  DRUG ALLERGIES:   Allergies  Allergen Reactions  . Latex     Itching Itching   VITALS:  Blood pressure (!) 168/100, pulse (!) 114, temperature 99.1 F (37.3 C), temperature source Oral, resp. rate 19, SpO2 98 %. PHYSICAL EXAMINATION:  Physical Exam  Constitutional: She appears well-developed and well-nourished.  HENT:  Head: Normocephalic and atraumatic.  Eyes: Pupils are equal, round, and reactive to light. Conjunctivae and EOM are normal.  Neck: Normal range of motion. Neck supple. No tracheal deviation present. No thyromegaly present.  Cardiovascular: Normal rate and regular rhythm.  Respiratory: Effort normal and breath sounds normal.  GI: Soft. Bowel sounds are normal. There is no abdominal tenderness.  Musculoskeletal: Normal range of motion.        General: No edema.  Neurological: She is alert.   Still slightly lethargic.  Full neuro exam not completed.  Skin: Skin is warm. No erythema.  Psychiatric: She has a normal mood and affect. Her behavior is normal.   LABORATORY PANEL:  Female CBC Recent Labs  Lab 02/21/19 0443  WBC 12.5*  HGB 11.8*  HCT 40.7  PLT 154   ------------------------------------------------------------------------------------------------------------------ Chemistries  Recent Labs  Lab 02/21/19 0443  NA 138  K 4.3  CL 109  CO2 21*  GLUCOSE 116*  BUN 16  CREATININE 0.77  CALCIUM 8.5*  MG 2.1  AST 19  ALT 10  ALKPHOS  50  BILITOT 0.8   RADIOLOGY:  Dg Abd 1 View  Result Date: 02/20/2019 CLINICAL DATA:  NG tube placement EXAM: ABDOMEN - 1 VIEW COMPARISON:  02/19/2019 FINDINGS: NG tube tip is in the distal stomach or proximal duodenum. IMPRESSION: NG tube tip distal stomach or proximal duodenum. Electronically Signed   By: Rolm Baptise M.D.   On: 02/20/2019 15:18   Dg Chest Port 1 View  Result Date: 02/21/2019 CLINICAL DATA:  Respiratory failure EXAM: PORTABLE CHEST 1 VIEW COMPARISON:  02/19/2019 FINDINGS: Endotracheal tube and NG tube removed. Left lower lobe atelectasis/infiltrate unchanged with small left effusion. Right lung remains clear. No edema. IMPRESSION: Endotracheal tube and NG tube removed No significant change in left lower lobe atelectasis/infiltrate and small left effusion. Electronically Signed   By: Franchot Gallo M.D.   On: 02/21/2019 07:07   ASSESSMENT AND PLAN:   73 year old female with permanent atrial fibrillation and chronic diastolic heart failure followed by Dr. Rockey Situ who presents to the emergency room due to abdominal pain and found to have acute appendicitis and atrial fibrillation with RVR.  1.  Atrial fibrillation with RVR:  Being managed by cardiologist.  Eliquis was placed on hold due to surgery done on 02/19/2019   Patient currently on IV metoprolol   Patient status post ablation and has had cardioversions in the past. Last echocardiogram 2017 showing normal ejection fraction Eliquis currently on hold since patient n.p.o. Patient on heparin drip for now until able to take p.o.   2.  Acute perforated  appendicitis:  Patient status post surgery on 02/19/2019.   Being managed by surgical team.    3 COPD without signs of exacerbation  4.  Rheumatoid arthritis: Continue outpatient regimen  5.  Hypokalemia: Replaced  DVT prophylaxis; patient currently on heparin drip    All the records are reviewed and case discussed with Care Management/Social Worker. Management  plans discussed with the patient, family and they are in agreement.  CODE STATUS: Full Code  TOTAL TIME TAKING CARE OF THIS PATIENT: 37 minutes.   More than 50% of the time was spent in counseling/coordination of care: YES  POSSIBLE D/C IN 2DAYS, DEPENDING ON CLINICAL CONDITION.   Letticia Bhattacharyya M.D on 02/21/2019 at 1:36 PM  Between 7am to 6pm - Pager - [phone]  After 6pm go to [email or web address] - Proofreader  [facility]  Office  [phone]  CC: Primary care physician; [name], FNP  Note: This dictation was prepared with Dragon dictation along with smaller phrase technology. Any transcriptional errors that result from this process are unintentional.

## 2019-02-22 ENCOUNTER — Inpatient Hospital Stay: Payer: Medicare Other

## 2019-02-22 LAB — HEPARIN LEVEL (UNFRACTIONATED): Heparin Unfractionated: 1.23 IU/mL — ABNORMAL HIGH (ref 0.30–0.70)

## 2019-02-22 LAB — COMPREHENSIVE METABOLIC PANEL
ALK PHOS: 53 U/L (ref 38–126)
ALT: 9 U/L (ref 0–44)
AST: 16 U/L (ref 15–41)
Albumin: 2.7 g/dL — ABNORMAL LOW (ref 3.5–5.0)
Anion gap: 8 (ref 5–15)
BUN: 15 mg/dL (ref 8–23)
CO2: 24 mmol/L (ref 22–32)
Calcium: 8.8 mg/dL — ABNORMAL LOW (ref 8.9–10.3)
Chloride: 106 mmol/L (ref 98–111)
Creatinine, Ser: 0.69 mg/dL (ref 0.44–1.00)
GFR calc Af Amer: >60 mL/min (ref >60)
GFR calc non Af Amer: >60 mL/min (ref >60)
Glucose, Bld: 105 mg/dL — ABNORMAL HIGH (ref 70–99)
Potassium: 4.4 mmol/L (ref 3.5–5.1)
Sodium: 138 mmol/L (ref 135–145)
TOTAL PROTEIN: 6.3 g/dL — AB (ref 6.5–8.1)
Total Bilirubin: 0.7 mg/dL (ref 0.3–1.2)

## 2019-02-22 LAB — CBC
HCT: 37.2 % (ref 36.0–46.0)
Hemoglobin: 11.3 g/dL — ABNORMAL LOW (ref 12.0–15.0)
MCH: 28.4 pg (ref 26.0–34.0)
MCHC: 30.4 g/dL (ref 30.0–36.0)
MCV: 93.5 fL (ref 80.0–100.0)
Platelets: 223 10*3/uL (ref 150–400)
RBC: 3.98 MIL/uL (ref 3.87–5.11)
RDW: 15.1 % (ref 11.5–15.5)
WBC: 9.9 10*3/uL (ref 4.0–10.5)
nRBC: 0 % (ref 0.0–0.2)

## 2019-02-22 LAB — MAGNESIUM: Magnesium: 2.2 mg/dL (ref 1.7–2.4)

## 2019-02-22 LAB — APTT
aPTT: 90 seconds — ABNORMAL HIGH (ref 24–36)
aPTT: 73 seconds — ABNORMAL HIGH (ref 24–36)

## 2019-02-22 MED ORDER — METOPROLOL TARTRATE 25 MG PO TABS: 25.0000 mg | ORAL_TABLET | Freq: Four times a day (QID) | ORAL | Status: DC

## 2019-02-22 MED ORDER — DEXTROSE IN LACTATED RINGERS 5 % IV SOLN
INTRAVENOUS | Status: DC
  Administered 2019-02-22 – 2019-02-24 (×2): via INTRAVENOUS

## 2019-02-22 MED ORDER — LINEZOLID 600 MG/300ML IV SOLN
600.0000 mg | Freq: Two times a day (BID) | INTRAVENOUS | Status: DC
  Filled 2019-02-22 (×2): qty 300

## 2019-02-22 MED ORDER — ESMOLOL HCL-SODIUM CHLORIDE 2000 MG/100ML IV SOLN
25.0000 ug/kg/min | INTRAVENOUS | Status: DC
  Administered 2019-02-22: 50 ug/kg/min via INTRAVENOUS
  Administered 2019-02-22: 200 ug/kg/min via INTRAVENOUS
  Administered 2019-02-22: 75 ug/kg/min via INTRAVENOUS
  Administered 2019-02-22: 150 ug/kg/min via INTRAVENOUS
  Administered 2019-02-23 (×2): 125 ug/kg/min via INTRAVENOUS
  Administered 2019-02-23: 100 ug/kg/min via INTRAVENOUS
  Administered 2019-02-23: 275 ug/kg/min via INTRAVENOUS
  Administered 2019-02-23: 225 ug/kg/min via INTRAVENOUS
  Administered 2019-02-23: 125 ug/kg/min via INTRAVENOUS
  Administered 2019-02-23: 225 ug/kg/min via INTRAVENOUS
  Administered 2019-02-23: 100 ug/kg/min via INTRAVENOUS
  Administered 2019-02-23: 250 ug/kg/min via INTRAVENOUS
  Administered 2019-02-24 (×2): 275 ug/kg/min via INTRAVENOUS
  Filled 2019-02-22 (×18): qty 100

## 2019-02-22 NOTE — Progress Notes (Signed)
[facility] for Heparin  Indication: atrial fibrillation  Allergies  Allergen Reactions  . Latex     Itching Itching    Patient Measurements:   Heparin Dosing Weight: 82 kg   Vital Signs: Temp: 99.1 F (37.3 C) (02/24 0400) Temp Source: Oral (02/24 0400) BP: 151/104 (02/24 0600) Pulse Rate: 111 (02/24 0600)  Labs: Recent Labs    02/20/19 0506 02/20/19 2325 02/21/19 0443 02/21/19 1131 02/21/19 1944 02/22/19 0641  HGB 12.3  --  11.8*  --   --  11.3*  HCT 40.0  --  40.7  --   --  37.2  PLT 213  --  154  --   --  223  APTT  --  >160*  --  91* 110* 90*  HEPARINUNFRC  --  1.82*  --   --  1.39* 1.23*  CREATININE  --   --  0.77  --   --  0.69    Estimated Creatinine Clearance: 78.9 mL/min (by C-G formula based on SCr of 0.69 mg/dL).   Medical History: Past Medical History:  Diagnosis Date  . Barretts esophagus   . Chest pain    a. 02/2014 Myoview: Ef 50%, no ischemia.  . Colon polyps   . COPD (chronic obstructive pulmonary disease) ([place])   . Depression with anxiety   . GERD (gastroesophageal reflux disease)   . Neuropathy   . Permanent atrial fibrillation    a. s/p ablation 01/2012 in [facility] by Dr. Boyd Kerbs;  b. On sotalol & Xarelto;  c. 02/2014 Echo: EF 50-55%, mild conc LVH, nl LA size/structure;  d. Recurrent afib 8/15 & 08/29/2014.  [name] Rectal fistula   . Rheumatoid arthritis ([place])    On methotrexate and orencia  . Urinary incontinence   . Vitamin D deficiency     Medications:  Medications Prior to Admission  Medication Sig Dispense Refill Last Dose  . ELIQUIS 5 MG TABS tablet Take 1 tablet (5 mg total) by mouth 2 (two) times daily. 180 tablet 3 02/18/2019 at 0900  . esomeprazole (NEXIUM) 20 MG capsule Take 20 mg by mouth daily at 12 noon.   02/18/2019 at 1200  . Fluticasone-Umeclidin-Vilant (TRELEGY ELLIPTA) 100-62.5-25 MCG/INH AEPB Inhale 1 puff into the lungs daily. 60 each 2 02/19/2019 at 0530  . gabapentin (NEURONTIN)  300 MG capsule Take 1 capsule (300 mg total) by mouth 3 (three) times daily. 90 capsule 3 02/18/2019 at 2000  . HUMIRA PEN 40 MG/0.4ML PNKT Inject 40 mg as directed. Every 2 weeks   Past Month at Unknown time  . hydroxychloroquine (PLAQUENIL) 200 MG tablet Take 200 mg by mouth 2 (two) times daily.    02/18/2019 at 2000  . propranolol ER (INDERAL LA) 80 MG 24 hr capsule Take 1 capsule (80 mg total) by mouth daily. 90 capsule 3 02/18/2019 at 0800  . rosuvastatin (CRESTOR) 10 MG tablet Take 1 tablet (10 mg total) by mouth daily. 90 tablet 3 02/18/2019 at 2000  . albuterol (PROVENTIL HFA;VENTOLIN HFA) 108 (90 Base) MCG/ACT inhaler Inhale 1-2 puffs into the lungs every 4 (four) hours as needed for wheezing or shortness of breath. 1 Inhaler 10 Unknown at prn  . furosemide (LASIX) 20 MG tablet Take 1 tablet (20 mg total) by mouth daily as needed. 90 tablet 2 Unknown at prn  . ibuprofen (ADVIL,MOTRIN) 400 MG tablet Take 400 mg by mouth every 4 (four) hours as needed.    Unknown at prn  .  lidocaine (XYLOCAINE) 5 % ointment Apply 1 application topically as needed.   Unknown at prn  . potassium chloride (K-DUR) 10 MEQ tablet Take 1 tablet (10 mEq total) by mouth daily as needed. 90 tablet 2 Unknown at prn  . traMADol (ULTRAM) 50 MG tablet Take 1 tablet (50 mg total) by mouth every 12 (twelve) hours as needed for severe pain. 30 tablet 0 Unknown at prn    Assessment: Pharmacy consulted for heparin drip management for 73 year old female with permanent atrial fibrillation and chronic diastolic heart failure followed by Dr. Rockey Situ who presented to the emergency room due to abdominal pain and found to have acute appendicitis and atrial fibrillation with RVR. Patient previously on apixaban PTA with last dose 02/20 in the evening.   Goal of Therapy:  Heparin level 0.3-0.7 units/ml aPTT 66-102s Monitor platelets by anticoagulation protocol: Yes   Plan:  Continue heparin at 1150 units/hr. Will obtain confirmatory  level at 1600.   Will order heparin levels daily until aPTTs and ant-Xa levels correlate.   Pharmacy will continue to monitor and adjust per consult.   Beautiful Pensyl L 02/22/2019,3:20 PM

## 2019-02-22 NOTE — Progress Notes (Addendum)
Update about her A. fib management: She has an ileus and still cannot take oral medications.  Ventricular rate is not controlled. I am trying to avoid calcium channel blockers which might worsen her ileus.  Thus, I elected to start on esmolol drip for rate control.  Oral metoprolol can be started once she is able to tolerate diet.

## 2019-02-22 NOTE — Progress Notes (Signed)
Progress Note  Patient Name: Brandy Armstrong Date of Encounter: 02/22/2019  Primary Cardiologist: Ida Rogue, MD   Subjective  She continues to have elevated blood pressure and heart rate.  She has known history of chronic atrial fibrillation.  Currently on IV metoprolol 5 mg every 6 hours. She denies any complaints although she seems to have difficulty expressing herself this morning.   Inpatient Medications    Scheduled Meds: . budesonide (PULMICORT) nebulizer solution  0.25 mg Nebulization Q12H  . chlorhexidine gluconate (MEDLINE KIT)  15 mL Mouth Rinse BID  . gabapentin  300 mg Oral TID  . ipratropium  0.5 mg Nebulization Q6H  . levalbuterol  0.63 mg Nebulization Q6H  . metoprolol tartrate  5 mg Intravenous Q6H   Continuous Infusions: . dextrose 5 % lactated ringers with kcl 50 mL/hr at 02/22/19 0600  . famotidine (PEPCID) IV 20 mg (02/21/19 2153)  . heparin 1,150 Units/hr (02/22/19 0600)  . piperacillin-tazobactam (ZOSYN)  IV 3.375 g (02/22/19 0534)   PRN Meds: acetaminophen **OR** acetaminophen, fentaNYL (SUBLIMAZE) injection, HYDROcodone-acetaminophen, metoprolol tartrate, ondansetron **OR** ondansetron (ZOFRAN) IV, potassium chloride SA   Vital Signs    Vitals:   02/22/19 0400 02/22/19 0500 02/22/19 0600 02/22/19 0734  BP: (!) 136/99 123/89 (!) 151/104   Pulse: (!) 101 (!) 110 (!) 111   Resp: '15 12 17   '$ Temp: 99.1 F (37.3 C)     TempSrc: Oral     SpO2: 97% 97% 98% 99%    Intake/Output Summary (Last 24 hours) at 02/22/2019 0835 Last data filed at 02/22/2019 0600 Gross per 24 hour  Intake 2156.99 ml  Output 580 ml  Net 1576.99 ml   Last 3 Weights 02/10/2019 02/05/2019 01/25/2019  Weight (lbs) 221 lb 3.2 oz 220 lb 4 oz 217 lb 3.2 oz  Weight (kg) 100.336 kg 99.905 kg 98.521 kg      Telemetry    Atrial fibrillation with rapid ventricular response.  Heart rate is ranging between 110 to 140 bpm- Personally Reviewed  ECG     - Personally  Reviewed  Physical Exam   GEN: No acute distress.   Neck: No JVD Cardiac:  Irregularly irregular and tachycardic, no murmurs, rubs, or gallops.  Respiratory: Clear to auscultation bilaterally with diminished breath sounds at the base GI: Soft, nontender, non-distended  MS: No edema; No deformity. Neuro:  Nonfocal  Psych: Normal affect   Labs    Chemistry Recent Labs  Lab 02/19/19 [phone] 02/20/19 0506 02/21/19 0443 02/22/19 0641  NA 138  --  138 138  K 3.3* 4.0 4.3 4.4  CL 102  --  109 106  CO2 27  --  21* 24  GLUCOSE 138*  --  116* 105*  BUN 11  --  16 15  CREATININE 0.80  --  0.77 0.69  CALCIUM 8.8*  --  8.5* 8.8*  PROT 6.6  --  6.1* 6.3*  ALBUMIN 3.3*  --  2.8* 2.7*  AST 22  --  19 16  ALT 11  --  10 9  ALKPHOS 68  --  50 53  BILITOT 0.7  --  0.8 0.7  GFRNONAA >60  --  >60 >60  GFRAA >60  --  >60 >60  ANIONGAP 9  --  8 8     Hematology Recent Labs  Lab 02/20/19 0506 02/21/19 0443 02/22/19 0641  WBC 24.2* 12.5* 9.9  RBC 4.41 4.24 3.98  HGB 12.3 11.8* 11.3*  HCT 40.0 40.7  37.2  MCV 90.7 96.0 93.5  MCH 27.9 27.8 28.4  MCHC 30.8 29.0* 30.4  RDW 15.3 15.4 15.1  PLT 213 154 223    Cardiac EnzymesNo results for input(s): TROPONINI in the last 168 hours. No results for input(s): TROPIPOC in the last 168 hours.   BNPNo results for input(s): BNP, PROBNP in the last 168 hours.   DDimer No results for input(s): DDIMER in the last 168 hours.   Radiology    Dg Abd 1 View  Result Date: 02/20/2019 CLINICAL DATA:  NG tube placement EXAM: ABDOMEN - 1 VIEW COMPARISON:  02/19/2019 FINDINGS: NG tube tip is in the distal stomach or proximal duodenum. IMPRESSION: NG tube tip distal stomach or proximal duodenum. Electronically Signed   By: Rolm Baptise M.D.   On: 02/20/2019 15:18   Dg Chest Port 1 View  Result Date: 02/22/2019 CLINICAL DATA:  Respiratory failure. EXAM: PORTABLE CHEST 1 VIEW COMPARISON:  02/21/2019 FINDINGS: Stable heart size. Stable tortuosity and  prominence of the thoracic aorta. Stable scarring and atelectasis at the left lung base. No edema, pleural fluid or pneumothorax. IMPRESSION: Stable scarring/atelectasis at the left lung base. Electronically Signed   By: Aletta Edouard M.D.   On: 02/22/2019 08:11   Dg Chest Port 1 View  Result Date: 02/21/2019 CLINICAL DATA:  Respiratory failure EXAM: PORTABLE CHEST 1 VIEW COMPARISON:  02/19/2019 FINDINGS: Endotracheal tube and NG tube removed. Left lower lobe atelectasis/infiltrate unchanged with small left effusion. Right lung remains clear. No edema. IMPRESSION: Endotracheal tube and NG tube removed No significant change in left lower lobe atelectasis/infiltrate and small left effusion. Electronically Signed   By: Franchot Gallo M.D.   On: 02/21/2019 07:07    Cardiac Studies   2D Echocardiogram 2.22.2020  1. The left ventricle has hyperdynamic systolic function, with an ejection fraction of >65%. The cavity size was normal. There is mildly increased left ventricular wall thickness. Left ventricular diastology could not be evaluated secondary to atrial  fibrillation. 2. The right ventricle has normal systolic function. The cavity was normal. There is no increase in right ventricular wall thickness. 3. The mitral valve is normal in structure. 4. The tricuspid valve is normal in structure. 5. The aortic valve is normal in structure. Mild thickening of the aortic valve no stenosis of the aortic valve. 6. The interatrial septum was not assessed.  Patient Profile     73 y.o. female with a hx of permanent Afib on Eliquis s/p ablation in 2013 with recurrent Afib s/p repeat ablation in 01/2015 s/p DCCV in 02/2015 s/p DCCV in 08/2016 with recurrent Afib 1 month later leading to the discontinuation of amiodarone and rate control strategy since, aortic atherosclerosis, chronic diastolic CHF, GI bleed on Xarelto, COPD secondary to tobacco abuse quitting in 2012, and anemia, who was admitted 2/21 with  acute appendicitis and rapid afib. Now s/p open appendectomy.  Assessment & Plan    1.  Atrial fibrillation with rapid ventricular response: The patient has known history of permanent atrial fibrillation and thus there is no role for rhythm control.  Continue with rate control. She is currently on metoprolol 5 mg IV every 6 hours. I am going to start her on oral metoprolol 25 mg every 6 hours.  Once oral metoprolol is started, IV metoprolol can be used as needed. She is currently on heparin drip and this should be changed to Eliquis once she is tolerating diet well and no plans for any further invasive procedures or surgeries.  2.  Acute appendicitis status post appendectomy on February 21.  3.  Chronic diastolic heart failure: She appears to be euvolemic.  Consider stopping IV fluids once she is tolerating diet well.     For questions or updates, please contact [place] Please consult [email or web address] for contact info under        Signed, Kathlyn Sacramento, MD  02/22/2019, 8:35 AM

## 2019-02-22 NOTE — Progress Notes (Signed)
[facility] [place] NAME: Brandy Armstrong    MR#:  [id number]  DATE OF BIRTH:  24-Jun-1946  SUBJECTIVE:  CHIEF COMPLAINT:   Chief Complaint  Patient presents with  . Abdominal Pain   No new complaint this morning.  Patient extubated on 02/20/2019 .  Had surgery done due to perforated appendicitis on 02/19/2019.  Patient more awake and alert and oriented this morning.  Family members updated at bedside.    REVIEW OF SYSTEMS:  Review of Systems  Constitutional: Negative for chills and fever.  HENT: Negative for hearing loss and tinnitus.   Eyes: Negative for blurred vision and double vision.  Respiratory: Negative for cough, sputum production and shortness of breath.   Cardiovascular: Negative for chest pain and palpitations.  Gastrointestinal: Negative for abdominal pain, heartburn, nausea and vomiting.  Genitourinary: Negative for dysuria and urgency.  Musculoskeletal: Negative for myalgias and neck pain.  Skin: Negative for itching and rash.  Neurological: Negative for dizziness and headaches.  Psychiatric/Behavioral: Negative for depression and hallucinations.      DRUG ALLERGIES:   Allergies  Allergen Reactions  . Latex     Itching Itching   VITALS:  Blood pressure (!) 151/104, pulse (!) 111, temperature 99.1 F (37.3 C), temperature source Oral, resp. rate 17, SpO2 98 %. PHYSICAL EXAMINATION:  Physical Exam  Constitutional: She appears well-developed and well-nourished.  HENT:  Head: Normocephalic and atraumatic.  Eyes: Pupils are equal, round, and reactive to light. Conjunctivae and EOM are normal.  Neck: Normal range of motion. Neck supple. No tracheal deviation present. No thyromegaly present.  Cardiovascular: Normal rate and regular rhythm.  Respiratory: Effort normal and breath sounds normal.  GI: Soft. Bowel sounds are normal. There is no abdominal tenderness.  Musculoskeletal: Normal range of motion.      General: No edema.  Neurological: She is alert. No cranial nerve deficit.  Skin: Skin is warm. No erythema.  Psychiatric: She has a normal mood and affect. Her behavior is normal.   LABORATORY PANEL:  Female CBC Recent Labs  Lab 02/22/19 0641  WBC 9.9  HGB 11.3*  HCT 37.2  PLT 223   ------------------------------------------------------------------------------------------------------------------ Chemistries  Recent Labs  Lab 02/22/19 0641  NA 138  K 4.4  CL 106  CO2 24  GLUCOSE 105*  BUN 15  CREATININE 0.69  CALCIUM 8.8*  MG 2.2  AST 16  ALT 9  ALKPHOS 53  BILITOT 0.7   RADIOLOGY:  Dg Chest Port 1 View  Result Date: 02/22/2019 CLINICAL DATA:  Respiratory failure. EXAM: PORTABLE CHEST 1 VIEW COMPARISON:  02/21/2019 FINDINGS: Stable heart size. Stable tortuosity and prominence of the thoracic aorta. Stable scarring and atelectasis at the left lung base. No edema, pleural fluid or pneumothorax. IMPRESSION: Stable scarring/atelectasis at the left lung base. Electronically Signed   By: Aletta Edouard M.D.   On: 02/22/2019 08:11   Dg Abd Portable 1v  Result Date: 02/22/2019 CLINICAL DATA:  Followup ileus. EXAM: PORTABLE ABDOMEN - 1 VIEW COMPARISON:  Radiographs 02/20/2019 FINDINGS: The lung bases are clear. The NG tube is been removed. Persistent air and stool distended colon and mildly air distended small bowel loops. Findings consistent with a persistent ileus. No free air. There appears to be a new intraperitoneal catheter in the pelvis. IMPRESSION: Persistent ileus bowel gas pattern. Electronically Signed   By: Marijo Sanes M.D.   On: 02/22/2019 12:50   ASSESSMENT AND PLAN:   73 year old female with  permanent atrial fibrillation and chronic diastolic heart failure followed by Dr. Rockey Situ who presents to the emergency room due to abdominal pain and found to have acute appendicitis and atrial fibrillation with RVR.  1.  Atrial fibrillation with RVR:  Being managed by  cardiologist.  Eliquis was placed on hold due to surgery done on 02/19/2019   Patient currently on IV metoprolol   Patient status post ablation and has had cardioversions in the past. Last echocardiogram 2017 showing normal ejection fraction Eliquis currently on hold since patient n.p.o. Patient on heparin drip for now until able to take p.o.   2.  Acute perforated appendicitis:  Patient status post surgery on 02/19/2019.   Being managed by surgical team.  Currently n.p.o.  3 COPD without signs of exacerbation  4.  Rheumatoid arthritis: Continue outpatient regimen  5.  Hypokalemia: Replaced  DVT prophylaxis; patient currently on heparin drip    All the records are reviewed and case discussed with Care Management/Social Worker. Management plans discussed with the patient, family and they are in agreement.  CODE STATUS: Full Code  TOTAL TIME TAKING CARE OF THIS PATIENT: 36 minutes.   More than 50% of the time was spent in counseling/coordination of care: YES  POSSIBLE D/C IN 2DAYS, DEPENDING ON CLINICAL CONDITION.   Kayana Thoen M.D on 02/22/2019 at 2:47 PM  Between 7am to 6pm - Pager - [phone]  After 6pm go to [email or web address] - Proofreader  [facility]  Office  [phone]  CC: Primary care physician; [name], FNP  Note: This dictation was prepared with Dragon dictation along with smaller phrase technology. Any transcriptional errors that result from this process are unintentional.

## 2019-02-22 NOTE — Progress Notes (Signed)
Brandy Armstrong Hospital Day(s): 3.   Post op day(s): 3 Days Post-Op.   Interval History: Patient seen and examined, no acute events or new complaints overnight. Patient reports feeling okay, denies or vomiting.  Nurse reports that patient has been calm.  There has been no issue of pain but patient is burping frequently.  There is no sign of flatus or bowel movement.  He has been very difficult to control patient's heart rate.  Vital signs in last 24 hours: [min-max] current  Temp:  [98.2 F (36.8 C)-99.8 F (37.7 C)] 99.1 F (37.3 C) (02/24 0400) Pulse Rate:  [98-133] 111 (02/24 0600) Resp:  [11-25] 17 (02/24 0600) BP: (119-167)/(81-139) 151/104 (02/24 0600) SpO2:  [95 %-99 %] 99 % (02/24 0734)             Physical Exam:  Constitutional: alert, cooperative and no distress  Respiratory: breathing non-labored at rest  Cardiovascular: regular rate and sinus rhythm  Gastrointestinal: soft, non-tender, and non-distended Clean.  Drain with serous sanguinolent fluid.  Labs:  CBC Latest Ref Rng & Units 02/22/2019 02/21/2019 02/20/2019  WBC 4.0 - 10.5 K/uL 9.9 12.5(H) 24.2(H)  Hemoglobin 12.0 - 15.0 g/dL 11.3(L) 11.8(L) 12.3  Hematocrit 36.0 - 46.0 % 37.2 40.7 40.0  Platelets 150 - 400 K/uL 223 154 213   CMP Latest Ref Rng & Units 02/22/2019 02/21/2019 02/20/2019  Glucose 70 - 99 mg/dL 105(H) 116(H) -  BUN 8 - 23 mg/dL 15 16 -  Creatinine 0.44 - 1.00 mg/dL 0.69 0.77 -  Sodium 135 - 145 mmol/L 138 138 -  Potassium 3.5 - 5.1 mmol/L 4.4 4.3 4.0  Chloride 98 - 111 mmol/L 106 109 -  CO2 22 - 32 mmol/L 24 21(L) -  Calcium 8.9 - 10.3 mg/dL 8.8(L) 8.5(L) -  Total Protein 6.5 - 8.1 g/dL 6.3(L) 6.1(L) -  Total Bilirubin 0.3 - 1.2 mg/dL 0.7 0.8 -  Alkaline Phos 38 - 126 U/L 53 50 -  AST 15 - 41 U/L 16 19 -  ALT 0 - 44 U/L 9 10 -    Imaging studies: No new pertinent imaging studies   Assessment/Plan:   73 y.o.femalewith acute perforated appendicitis1 Day Post-Ops/p  laparoscopic converted to open appendectomy, complicated by pertinent comorbidities includingsepsis, atrial fibrillation on anticoagulation, COPD, rheumatoid arthritis. Patient recovering very slowly.  From surgical standpoint patient today looks more comfortable.  Pain has been controlled better.  There is no sign of flatus or bowel movement but x-ray looks without distended bowel loops or air-fluid levels.  Patient is not nauseous and alert enough, there is no contraindication to start clear liquids and progress as tolerated.  If patient developed nausea after surgical liquid should be placed back to bowel rest.  To need to follow closely.  Arnold Long, MD

## 2019-02-22 NOTE — Progress Notes (Signed)
[facility] for Heparin  Indication: atrial fibrillation  Allergies  Allergen Reactions  . Latex     Itching Itching    Patient Measurements: Height: 5\' 6"  (167.6 cm) Weight: 220 lb 14.4 oz (100.2 kg) IBW/kg (Calculated) : 59.3 Heparin Dosing Weight: 82 kg   Vital Signs: BP: 151/104 (02/24 0600) Pulse Rate: 111 (02/24 0600)  Labs: Recent Labs    02/20/19 0506 02/20/19 2325 02/21/19 0443  02/21/19 1944 02/22/19 0641 02/22/19 1547  HGB 12.3  --  11.8*  --   --  11.3*  --   HCT 40.0  --  40.7  --   --  37.2  --   PLT 213  --  154  --   --  223  --   APTT  --  >160*  --    < > 110* 90* 73*  HEPARINUNFRC  --  1.82*  --   --  1.39* 1.23*  --   CREATININE  --   --  0.77  --   --  0.69  --    < > = values in this interval not displayed.    Estimated Creatinine Clearance: 78.2 mL/min (by C-G formula based on SCr of 0.69 mg/dL).   Medical History: Past Medical History:  Diagnosis Date  . Barretts esophagus   . Chest pain    a. 02/2014 Myoview: Ef 50%, no ischemia.  . Colon polyps   . COPD (chronic obstructive pulmonary disease) ([place])   . Depression with anxiety   . GERD (gastroesophageal reflux disease)   . Neuropathy   . Permanent atrial fibrillation    a. s/p ablation 01/2012 in [facility] by Dr. Boyd Kerbs;  b. On sotalol & Xarelto;  c. 02/2014 Echo: EF 50-55%, mild conc LVH, nl LA size/structure;  d. Recurrent afib 8/15 & 08/29/2014.  [name] Rectal fistula   . Rheumatoid arthritis ([place])    On methotrexate and orencia  . Urinary incontinence   . Vitamin D deficiency     Medications:  Medications Prior to Admission  Medication Sig Dispense Refill Last Dose  . ELIQUIS 5 MG TABS tablet Take 1 tablet (5 mg total) by mouth 2 (two) times daily. 180 tablet 3 02/18/2019 at 0900  . esomeprazole (NEXIUM) 20 MG capsule Take 20 mg by mouth daily at 12 noon.   02/18/2019 at 1200  . Fluticasone-Umeclidin-Vilant (TRELEGY ELLIPTA) 100-62.5-25 MCG/INH  AEPB Inhale 1 puff into the lungs daily. 60 each 2 02/19/2019 at 0530  . gabapentin (NEURONTIN) 300 MG capsule Take 1 capsule (300 mg total) by mouth 3 (three) times daily. 90 capsule 3 02/18/2019 at 2000  . HUMIRA PEN 40 MG/0.4ML PNKT Inject 40 mg as directed. Every 2 weeks   Past Month at Unknown time  . hydroxychloroquine (PLAQUENIL) 200 MG tablet Take 200 mg by mouth 2 (two) times daily.    02/18/2019 at 2000  . propranolol ER (INDERAL LA) 80 MG 24 hr capsule Take 1 capsule (80 mg total) by mouth daily. 90 capsule 3 02/18/2019 at 0800  . rosuvastatin (CRESTOR) 10 MG tablet Take 1 tablet (10 mg total) by mouth daily. 90 tablet 3 02/18/2019 at 2000  . albuterol (PROVENTIL HFA;VENTOLIN HFA) 108 (90 Base) MCG/ACT inhaler Inhale 1-2 puffs into the lungs every 4 (four) hours as needed for wheezing or shortness of breath. 1 Inhaler 10 Unknown at prn  . furosemide (LASIX) 20 MG tablet Take 1 tablet (20 mg total) by mouth daily as  needed. 90 tablet 2 Unknown at prn  . ibuprofen (ADVIL,MOTRIN) 400 MG tablet Take 400 mg by mouth every 4 (four) hours as needed.    Unknown at prn  . lidocaine (XYLOCAINE) 5 % ointment Apply 1 application topically as needed.   Unknown at prn  . potassium chloride (K-DUR) 10 MEQ tablet Take 1 tablet (10 mEq total) by mouth daily as needed. 90 tablet 2 Unknown at prn  . traMADol (ULTRAM) 50 MG tablet Take 1 tablet (50 mg total) by mouth every 12 (twelve) hours as needed for severe pain. 30 tablet 0 Unknown at prn    Assessment: Pharmacy consulted for heparin drip management for 73 year old female with permanent atrial fibrillation and chronic diastolic heart failure followed by Dr. Rockey Situ who presented to the emergency room due to abdominal pain and found to have acute appendicitis and atrial fibrillation with RVR. Patient previously on apixaban PTA with last dose 02/20 in the evening.   Goal of Therapy:  Heparin level 0.3-0.7 units/ml aPTT 66-102s Monitor platelets by  anticoagulation protocol: Yes   Plan:  Patient is therapeutic x 2 (aPTT - 90 @ 0641, 73 @ 1547)  Continue heparin at 1150 units/hr.   Will order heparin levels daily until aPTTs and ant-Xa levels correlate.   Pharmacy will continue to monitor and adjust per consult.  Lu Duffel, PharmD, BCPS Clinical Pharmacist 02/22/2019 4:25 PM

## 2019-02-23 ENCOUNTER — Inpatient Hospital Stay: Payer: Medicare Other

## 2019-02-23 DIAGNOSIS — K3580 Unspecified acute appendicitis: Secondary | ICD-10-CM

## 2019-02-23 LAB — CBC
HCT: 36.2 % (ref 36.0–46.0)
Hemoglobin: 11 g/dL — ABNORMAL LOW (ref 12.0–15.0)
MCH: 27.6 pg (ref 26.0–34.0)
MCHC: 30.4 g/dL (ref 30.0–36.0)
MCV: 90.7 fL (ref 80.0–100.0)
Platelets: 200 10*3/uL (ref 150–400)
RBC: 3.99 MIL/uL (ref 3.87–5.11)
RDW: 15 % (ref 11.5–15.5)
WBC: 6.5 10*3/uL (ref 4.0–10.5)
nRBC: 0 % (ref 0.0–0.2)

## 2019-02-23 LAB — HEPARIN LEVEL (UNFRACTIONATED): Heparin Unfractionated: 1.02 IU/mL — ABNORMAL HIGH (ref 0.30–0.70)

## 2019-02-23 LAB — BASIC METABOLIC PANEL
Anion gap: 5 (ref 5–15)
BUN: 12 mg/dL (ref 8–23)
CHLORIDE: 105 mmol/L (ref 98–111)
CO2: 26 mmol/L (ref 22–32)
Calcium: 8.4 mg/dL — ABNORMAL LOW (ref 8.9–10.3)
Creatinine, Ser: 0.68 mg/dL (ref 0.44–1.00)
GFR calc non Af Amer: >60 mL/min (ref >60)
Glucose, Bld: 108 mg/dL — ABNORMAL HIGH (ref 70–99)
Potassium: 4.2 mmol/L (ref 3.5–5.1)
Sodium: 136 mmol/L (ref 135–145)

## 2019-02-23 LAB — SURGICAL PATHOLOGY

## 2019-02-23 LAB — APTT
APTT: 78 s — AB (ref 24–36)
APTT: 66 s — AB (ref 24–36)

## 2019-02-23 LAB — MAGNESIUM: Magnesium: 2.1 mg/dL (ref 1.7–2.4)

## 2019-02-23 MED ORDER — MIDAZOLAM HCL 2 MG/2ML IJ SOLN
INTRAMUSCULAR | Status: AC
  Filled 2019-02-23: qty 2

## 2019-02-23 MED ORDER — METOPROLOL TARTRATE 5 MG/5ML IV SOLN
10.0000 mg | Freq: Four times a day (QID) | INTRAVENOUS | Status: DC
  Administered 2019-02-23 – 2019-02-27 (×14): 10 mg via INTRAVENOUS
  Administered 2019-02-27: 5 mg via INTRAVENOUS
  Administered 2019-02-28 – 2019-03-01 (×5): 10 mg via INTRAVENOUS
  Filled 2019-02-23 (×20): qty 10

## 2019-02-23 MED ORDER — METOPROLOL TARTRATE 5 MG/5ML IV SOLN
7.5000 mg | Freq: Four times a day (QID) | INTRAVENOUS | Status: DC
  Administered 2019-02-23: 7.5 mg via INTRAVENOUS
  Filled 2019-02-23: qty 10

## 2019-02-23 NOTE — Progress Notes (Signed)
[place] for Heparin  Indication: atrial fibrillation  Allergies  Allergen Reactions  . Latex     Itching Itching    Patient Measurements: Height: 5\' 6"  (167.6 cm) Weight: 220 lb 14.4 oz (100.2 kg) IBW/kg (Calculated) : 59.3 Heparin Dosing Weight: 82 kg   Vital Signs: Temp: 98.3 F (36.8 C) (02/25 0800) Temp Source: Oral (02/25 0800) BP: 155/116 (02/25 1300) Pulse Rate: 113 (02/25 1300)  Labs: Recent Labs    02/21/19 0443  02/21/19 1944 02/22/19 0641 02/22/19 1547 02/23/19 0522 02/23/19 1256  HGB 11.8*  --   --  11.3*  --  11.0*  --   HCT 40.7  --   --  37.2  --  36.2  --   PLT 154  --   --  223  --  200  --   APTT  --    < > 110* 90* 73* 78* 66*  HEPARINUNFRC  --   --  1.39* 1.23*  --  1.02*  --   CREATININE 0.77  --   --  0.69  --  0.68  --    < > = values in this interval not displayed.    Estimated Creatinine Clearance: 78.2 mL/min (by C-G formula based on SCr of 0.68 mg/dL).   Medical History: Past Medical History:  Diagnosis Date  . Barretts esophagus   . Chest pain    a. 02/2014 Myoview: Ef 50%, no ischemia.  . Colon polyps   . COPD (chronic obstructive pulmonary disease) ([place])   . Depression with anxiety   . GERD (gastroesophageal reflux disease)   . Neuropathy   . Permanent atrial fibrillation    a. s/p ablation 01/2012 in [facility] by Dr. Boyd Kerbs;  b. On sotalol & Xarelto;  c. 02/2014 Echo: EF 50-55%, mild conc LVH, nl LA size/structure;  d. Recurrent afib 8/15 & 08/29/2014.  [name] Rectal fistula   . Rheumatoid arthritis ([place])    On methotrexate and orencia  . Urinary incontinence   . Vitamin D deficiency     Medications:  Medications Prior to Admission  Medication Sig Dispense Refill Last Dose  . ELIQUIS 5 MG TABS tablet Take 1 tablet (5 mg total) by mouth 2 (two) times daily. 180 tablet 3 02/18/2019 at 0900  . esomeprazole (NEXIUM) 20 MG capsule Take 20 mg by mouth daily at 12 noon.   02/18/2019 at 1200  .  Fluticasone-Umeclidin-Vilant (TRELEGY ELLIPTA) 100-62.5-25 MCG/INH AEPB Inhale 1 puff into the lungs daily. 60 each 2 02/19/2019 at 0530  . gabapentin (NEURONTIN) 300 MG capsule Take 1 capsule (300 mg total) by mouth 3 (three) times daily. 90 capsule 3 02/18/2019 at 2000  . HUMIRA PEN 40 MG/0.4ML PNKT Inject 40 mg as directed. Every 2 weeks   Past Month at Unknown time  . hydroxychloroquine (PLAQUENIL) 200 MG tablet Take 200 mg by mouth 2 (two) times daily.    02/18/2019 at 2000  . propranolol ER (INDERAL LA) 80 MG 24 hr capsule Take 1 capsule (80 mg total) by mouth daily. 90 capsule 3 02/18/2019 at 0800  . rosuvastatin (CRESTOR) 10 MG tablet Take 1 tablet (10 mg total) by mouth daily. 90 tablet 3 02/18/2019 at 2000  . albuterol (PROVENTIL HFA;VENTOLIN HFA) 108 (90 Base) MCG/ACT inhaler Inhale 1-2 puffs into the lungs every 4 (four) hours as needed for wheezing or shortness of breath. 1 Inhaler 10 Unknown at prn  . furosemide (LASIX) 20 MG tablet Take 1  tablet (20 mg total) by mouth daily as needed. 90 tablet 2 Unknown at prn  . ibuprofen (ADVIL,MOTRIN) 400 MG tablet Take 400 mg by mouth every 4 (four) hours as needed.    Unknown at prn  . lidocaine (XYLOCAINE) 5 % ointment Apply 1 application topically as needed.   Unknown at prn  . potassium chloride (K-DUR) 10 MEQ tablet Take 1 tablet (10 mEq total) by mouth daily as needed. 90 tablet 2 Unknown at prn  . traMADol (ULTRAM) 50 MG tablet Take 1 tablet (50 mg total) by mouth every 12 (twelve) hours as needed for severe pain. 30 tablet 0 Unknown at prn    Assessment: Pharmacy consulted for heparin drip management for 73 year old female with permanent atrial fibrillation and chronic diastolic heart failure followed by Dr. Rockey Situ who presented to the emergency room due to abdominal pain and found to have acute appendicitis and atrial fibrillation with RVR. Patient previously on apixaban PTA with last dose 02/20 in the evening.   Goal of Therapy:  Heparin  level 0.3-0.7 units/ml aPTT 66-102s Monitor platelets by anticoagulation protocol: Yes   Plan:  02/25 @ 1300 aPTT 66 seconds therapeutic. Will continue current rate and will recheck aPTT and HL w/ am labs to assess correlation. CBC stable will continue to monitor.  Paulina Fusi, PharmD, BCPS 02/23/2019 2:08 PM

## 2019-02-23 NOTE — Progress Notes (Signed)
[facility] NAME: Kimmi Acocella    MR#:  [id number]  DATE OF BIRTH:  [birth year]  SUBJECTIVE:  CHIEF COMPLAINT:   Chief Complaint  Patient presents with  . Abdominal Pain   No new complaint this morning.  Patient extubated on 02/20/2019 .  Had surgery done due to perforated appendicitis on 02/19/2019.  Patient awake and alert and oriented this morning.   REVIEW OF SYSTEMS:  Review of Systems  Constitutional: Negative for chills and fever.  HENT: Negative for hearing loss and tinnitus.   Eyes: Negative for blurred vision and double vision.  Respiratory: Negative for cough, sputum production and shortness of breath.   Cardiovascular: Negative for chest pain and palpitations.  Gastrointestinal: Negative for abdominal pain, heartburn, nausea and vomiting.  Genitourinary: Negative for dysuria and urgency.  Musculoskeletal: Negative for myalgias and neck pain.  Skin: Negative for itching and rash.  Neurological: Negative for dizziness and headaches.  Psychiatric/Behavioral: Negative for depression and hallucinations.      DRUG ALLERGIES:   Allergies  Allergen Reactions  . Latex     Itching Itching   VITALS:  Blood pressure (!) 142/96, pulse (!) 122, temperature 98.3 F (36.8 C), temperature source Oral, resp. rate 16, height 5\' 6"  (1.676 m), weight 100.2 kg, SpO2 93 %. PHYSICAL EXAMINATION:  Physical Exam  Constitutional: She appears well-developed and well-nourished.  HENT:  Head: Normocephalic and atraumatic.  Eyes: Pupils are equal, round, and reactive to light. Conjunctivae and EOM are normal.  Neck: Normal range of motion. Neck supple. No tracheal deviation present. No thyromegaly present.  Cardiovascular: Normal rate and regular rhythm.  Respiratory: Effort normal and breath sounds normal.  GI: Soft. Bowel sounds are normal. There is no abdominal tenderness.  Musculoskeletal: Normal range of motion.      General: No edema.  Neurological: She is alert. No cranial nerve deficit.  Skin: Skin is warm. No erythema.  Psychiatric: She has a normal mood and affect. Her behavior is normal.   LABORATORY PANEL:  Female CBC Recent Labs  Lab 02/23/19 0522  WBC 6.5  HGB 11.0*  HCT 36.2  PLT 200   ------------------------------------------------------------------------------------------------------------------ Chemistries  Recent Labs  Lab 02/22/19 0641 02/23/19 0522  NA 138 136  K 4.4 4.2  CL 106 105  CO2 24 26  GLUCOSE 105* 108*  BUN 15 12  CREATININE 0.69 0.68  CALCIUM 8.8* 8.4*  MG 2.2 2.1  AST 16  --   ALT 9  --   ALKPHOS 53  --   BILITOT 0.7  --    RADIOLOGY:  Dg Abd Portable 1v  Result Date: 02/23/2019 CLINICAL DATA:  Follow-up ileus EXAM: PORTABLE ABDOMEN - 1 VIEW COMPARISON:  02/22/2019 FINDINGS: Scattered large and small bowel gas is noted similar to that seen on the previous day. No definitive obstruction is seen and this likely represents focal ileus. Surgical drain is noted in right lower quadrant. No free air is noted. IMPRESSION: Stable appearance of the abdomen. Electronically Signed   By: Inez Catalina M.D.   On: 02/23/2019 11:29   ASSESSMENT AND PLAN:   73 year old female with permanent atrial fibrillation and chronic diastolic heart failure followed by Dr. Rockey Situ who presents to the emergency room due to abdominal pain and found to have acute appendicitis and atrial fibrillation with RVR.  1.  Atrial fibrillation with RVR:  Being managed by cardiologist.  Eliquis was placed on hold due to surgery done  on 02/19/2019   Patient was on IV metoprolol.  To be transitioned to p.o. metoprolol when tolerating p.o. intake.  Patient status post ablation and has had cardioversions in the past. Last echocardiogram 2017 showing normal ejection fraction Eliquis currently on hold since patient n.p.o. Patient on heparin drip for now until able to take p.o.   2.  Acute perforated  appendicitis:  Patient status post surgery on 02/19/2019.   Being managed by surgical team.  Patient to be started on clear liquid diet today.  3 COPD without signs of exacerbation  4.  Rheumatoid arthritis: Continue outpatient regimen  5.  Hypokalemia: Replaced  DVT prophylaxis; patient currently on heparin drip    All the records are reviewed and case discussed with Care Management/Social Worker. Management plans discussed with the patient, family and they are in agreement.  CODE STATUS: Full Code  TOTAL TIME TAKING CARE OF THIS PATIENT: 34 minutes.   More than 50% of the time was spent in counseling/coordination of care: YES  POSSIBLE D/C IN 2DAYS, DEPENDING ON CLINICAL CONDITION.   Denzil Bristol M.D on 02/23/2019 at 3:11 PM  Between 7am to 6pm - Pager - [phone]  After 6pm go to [email or web address] - Proofreader  [facility]  Office  [phone]  CC: Primary care physician; [name], FNP  Note: This dictation was prepared with Dragon dictation along with smaller phrase technology. Any transcriptional errors that result from this process are unintentional.

## 2019-02-23 NOTE — Progress Notes (Signed)
Progress Note  Patient Name: Brandy Armstrong Date of Encounter: 02/23/2019  Primary Cardiologist: Ida Rogue, MD   Subjective   Patient oriented x1 today. Not able to respond to most ROS; however, managed to report she is feeling as if her heart is racing and is SOB. Not able to communicate any other complaints.  Inpatient Medications    Scheduled Meds: . budesonide (PULMICORT) nebulizer solution  0.25 mg Nebulization Q12H  . chlorhexidine gluconate (MEDLINE KIT)  15 mL Mouth Rinse BID  . ipratropium  0.5 mg Nebulization Q6H  . levalbuterol  0.63 mg Nebulization Q6H  . metoprolol tartrate  7.5 mg Intravenous Q6H  . midazolam       Continuous Infusions: . dextrose 5% lactated ringers 50 mL/hr at 02/23/19 1200  . esmolol 125 mcg/kg/min (02/23/19 1256)  . famotidine (PEPCID) IV 20 mg (02/23/19 1002)  . heparin 1,150 Units/hr (02/23/19 1200)  . piperacillin-tazobactam (ZOSYN)  IV Stopped (02/23/19 0956)   PRN Meds: acetaminophen **OR** acetaminophen, fentaNYL (SUBLIMAZE) injection, HYDROcodone-acetaminophen, ondansetron **OR** ondansetron (ZOFRAN) IV   Vital Signs    Vitals:   02/23/19 0900 02/23/19 1000 02/23/19 1100 02/23/19 1200  BP: (!) 148/104 122/90 (!) 146/113 (!) 138/109  Pulse: (!) 118 (!) 112 (!) 107 (!) 108  Resp: 17 18 (!) 25 20  Temp:      TempSrc:      SpO2: 94% 90% 92% 93%  Weight:      Height:        Intake/Output Summary (Last 24 hours) at 02/23/2019 1302 Last data filed at 02/23/2019 1200 Gross per 24 hour  Intake 3354.99 ml  Output 2090 ml  Net 1264.99 ml   Last 3 Weights 02/22/2019 02/10/2019 02/05/2019  Weight (lbs) 220 lb 14.4 oz 221 lb 3.2 oz 220 lb 4 oz  Weight (kg) 100.2 kg 100.336 kg 99.905 kg      Telemetry    Atrial fibrillation with rapid ventricular response.  Rates improved but still elevated in 100s-120s- Personally Reviewed  ECG     No new tracings- Personally Reviewed  Physical Exam   GEN: Somewhat in distress when asked  to communicate any complaints Neck: No JVD Cardiac:  Irregularly irregular and tachycardic, no murmurs, rubs, or gallops.  Respiratory: Clear to auscultation bilaterally with diminished breath sounds at the bases GI: Soft, tender RUQ to gentle palpation, non-distended  MS: No bilateral lower extremity edema; No deformity. Neuro:  Does not appear as oriented as outpatient documentation  Indicates, which nursing staff indicated has been her baseline since in the ICU  Psych: Normal affect, distressed when not able to communicate  [place]  Lab 02/19/19 0633  02/21/19 0443 02/22/19 0641 02/23/19 0522  NA 138  --  138 138 136  K 3.3*   < > 4.3 4.4 4.2  CL 102  --  109 106 105  CO2 27  --  21* 24 26  GLUCOSE 138*  --  116* 105* 108*  BUN 11  --  '16 15 12  '$ CREATININE 0.80  --  0.77 0.69 0.68  CALCIUM 8.8*  --  8.5* 8.8* 8.4*  PROT 6.6  --  6.1* 6.3*  --   ALBUMIN 3.3*  --  2.8* 2.7*  --   AST 22  --  19 16  --   ALT 11  --  10 9  --   ALKPHOS 68  --  50 53  --   BILITOT  0.7  --  0.8 0.7  --   GFRNONAA >60  --  >60 >60 >60  GFRAA >60  --  >60 >60 >60  ANIONGAP 9  --  '8 8 5   '$ < > = values in this interval not displayed.     Hematology Recent Labs  Lab 02/21/19 0443 02/22/19 0641 02/23/19 0522  WBC 12.5* 9.9 6.5  RBC 4.24 3.98 3.99  HGB 11.8* 11.3* 11.0*  HCT 40.7 37.2 36.2  MCV 96.0 93.5 90.7  MCH 27.8 28.4 27.6  MCHC 29.0* 30.4 30.4  RDW 15.4 15.1 15.0  PLT 154 223 200    Cardiac EnzymesNo results for input(s): TROPONINI in the last 168 hours. No results for input(s): TROPIPOC in the last 168 hours.   BNPNo results for input(s): BNP, PROBNP in the last 168 hours.   DDimer No results for input(s): DDIMER in the last 168 hours.   Radiology    Dg Chest Port 1 View  Result Date: 02/22/2019 CLINICAL DATA:  Respiratory failure. EXAM: PORTABLE CHEST 1 VIEW COMPARISON:  02/21/2019 FINDINGS: Stable heart size. Stable tortuosity and prominence of  the thoracic aorta. Stable scarring and atelectasis at the left lung base. No edema, pleural fluid or pneumothorax. IMPRESSION: Stable scarring/atelectasis at the left lung base. Electronically Signed   By: Aletta Edouard M.D.   On: 02/22/2019 08:11   Dg Abd Portable 1v  Result Date: 02/23/2019 CLINICAL DATA:  Follow-up ileus EXAM: PORTABLE ABDOMEN - 1 VIEW COMPARISON:  02/22/2019 FINDINGS: Scattered large and small bowel gas is noted similar to that seen on the previous day. No definitive obstruction is seen and this likely represents focal ileus. Surgical drain is noted in right lower quadrant. No free air is noted. IMPRESSION: Stable appearance of the abdomen. Electronically Signed   By: Inez Catalina M.D.   On: 02/23/2019 11:29   Dg Abd Portable 1v  Result Date: 02/22/2019 CLINICAL DATA:  Followup ileus. EXAM: PORTABLE ABDOMEN - 1 VIEW COMPARISON:  Radiographs 02/20/2019 FINDINGS: The lung bases are clear. The NG tube is been removed. Persistent air and stool distended colon and mildly air distended small bowel loops. Findings consistent with a persistent ileus. No free air. There appears to be a new intraperitoneal catheter in the pelvis. IMPRESSION: Persistent ileus bowel gas pattern. Electronically Signed   By: Marijo Sanes M.D.   On: 02/22/2019 12:50    Cardiac Studies   2D Echocardiogram 2.22.2020  1. The left ventricle has hyperdynamic systolic function, with an ejection fraction of >65%. The cavity size was normal. There is mildly increased left ventricular wall thickness. Left ventricular diastology could not be evaluated secondary to atrial  fibrillation. 2. The right ventricle has normal systolic function. The cavity was normal. There is no increase in right ventricular wall thickness. 3. The mitral valve is normal in structure. 4. The tricuspid valve is normal in structure. 5. The aortic valve is normal in structure. Mild thickening of the aortic valve no stenosis of the  aortic valve. 6. The interatrial septum was not assessed.  Patient Profile     73 y.o. female with a hx of permanent Afib on Eliquis s/p ablation in 2013 with recurrent Afib s/p repeat ablation in 01/2015 s/p DCCV in 02/2015 s/p DCCV in 08/2016 with recurrent Afib 1 month later leading to the discontinuation of amiodarone and rate control strategy since, aortic atherosclerosis, chronic diastolic CHF, GI bleed on Xarelto, COPD secondary to tobacco abuse quitting in 2012, and anemia, who  was admitted 2/21 with acute appendicitis and rapid afib. Now s/p open appendectomy.  Assessment & Plan    1.  Atrial fibrillation with rapid ventricular response:  - No plans for rhythm control. Continue with rate control. Rates still remain somewhat elevated and above 110 as above. Was previously on metoprolol 5 mg IV every 6 hours with plan to transition to PRN once started on oral metoprolol '25mg'$  q6h but not tolerating oral medications. - IV metoprolol tartrate increased to 7.'5mg'$  IV q6h for further rate control. Continue esmolol drip. Continue heparin drip with plan to transition to Eliquis once she is tolerating diet / oral medications. No plans for any further invasive procedures or surgeries.  2.  Acute appendicitis status post appendectomy on February 21.  3.  Chronic diastolic heart failure: She appears to be euvolemic on exam.  Consider stopping IV fluids once she is tolerating diet well.     For questions or updates, please contact [facility] Please consult [email or web address] for contact info under        Signed, Arvil Chaco, PA-C  02/23/2019, 1:02 PM

## 2019-02-23 NOTE — Progress Notes (Signed)
[facility] for Heparin  Indication: atrial fibrillation  Allergies  Allergen Reactions  . Latex     Itching Itching    Patient Measurements: Height: 5\' 6"  (167.6 cm) Weight: 220 lb 14.4 oz (100.2 kg) IBW/kg (Calculated) : 59.3 Heparin Dosing Weight: 82 kg   Vital Signs: Temp: 98.2 F (36.8 C) (02/25 0000) Temp Source: Oral (02/25 0000) BP: 115/78 (02/25 0400) Pulse Rate: 117 (02/25 0400)  Labs: Recent Labs    02/21/19 0443  02/21/19 1944 02/22/19 0641 02/22/19 1547 02/23/19 0522  HGB 11.8*  --   --  11.3*  --  11.0*  HCT 40.7  --   --  37.2  --  36.2  PLT 154  --   --  223  --  200  APTT  --    < > 110* 90* 73* 78*  HEPARINUNFRC  --   --  1.39* 1.23*  --  1.02*  CREATININE 0.77  --   --  0.69  --  0.68   < > = values in this interval not displayed.    Estimated Creatinine Clearance: 78.2 mL/min (by C-G formula based on SCr of 0.68 mg/dL).   Medical History: Past Medical History:  Diagnosis Date  . Barretts esophagus   . Chest pain    a. 02/2014 Myoview: Ef 50%, no ischemia.  . Colon polyps   . COPD (chronic obstructive pulmonary disease) ([place])   . Depression with anxiety   . GERD (gastroesophageal reflux disease)   . Neuropathy   . Permanent atrial fibrillation    a. s/p ablation 01/2012 in [facility] by Dr. Boyd Kerbs;  b. On sotalol & Xarelto;  c. 02/2014 Echo: EF 50-55%, mild conc LVH, nl LA size/structure;  d. Recurrent afib 8/15 & 08/29/2014.  [name] Rectal fistula   . Rheumatoid arthritis ([place])    On methotrexate and orencia  . Urinary incontinence   . Vitamin D deficiency     Medications:  Medications Prior to Admission  Medication Sig Dispense Refill Last Dose  . ELIQUIS 5 MG TABS tablet Take 1 tablet (5 mg total) by mouth 2 (two) times daily. 180 tablet 3 02/18/2019 at 0900  . esomeprazole (NEXIUM) 20 MG capsule Take 20 mg by mouth daily at 12 noon.   02/18/2019 at 1200  . Fluticasone-Umeclidin-Vilant (TRELEGY  ELLIPTA) 100-62.5-25 MCG/INH AEPB Inhale 1 puff into the lungs daily. 60 each 2 02/19/2019 at 0530  . gabapentin (NEURONTIN) 300 MG capsule Take 1 capsule (300 mg total) by mouth 3 (three) times daily. 90 capsule 3 02/18/2019 at 2000  . HUMIRA PEN 40 MG/0.4ML PNKT Inject 40 mg as directed. Every 2 weeks   Past Month at Unknown time  . hydroxychloroquine (PLAQUENIL) 200 MG tablet Take 200 mg by mouth 2 (two) times daily.    02/18/2019 at 2000  . propranolol ER (INDERAL LA) 80 MG 24 hr capsule Take 1 capsule (80 mg total) by mouth daily. 90 capsule 3 02/18/2019 at 0800  . rosuvastatin (CRESTOR) 10 MG tablet Take 1 tablet (10 mg total) by mouth daily. 90 tablet 3 02/18/2019 at 2000  . albuterol (PROVENTIL HFA;VENTOLIN HFA) 108 (90 Base) MCG/ACT inhaler Inhale 1-2 puffs into the lungs every 4 (four) hours as needed for wheezing or shortness of breath. 1 Inhaler 10 Unknown at prn  . furosemide (LASIX) 20 MG tablet Take 1 tablet (20 mg total) by mouth daily as needed. 90 tablet 2 Unknown at prn  . ibuprofen (  ADVIL,MOTRIN) 400 MG tablet Take 400 mg by mouth every 4 (four) hours as needed.    Unknown at prn  . lidocaine (XYLOCAINE) 5 % ointment Apply 1 application topically as needed.   Unknown at prn  . potassium chloride (K-DUR) 10 MEQ tablet Take 1 tablet (10 mEq total) by mouth daily as needed. 90 tablet 2 Unknown at prn  . traMADol (ULTRAM) 50 MG tablet Take 1 tablet (50 mg total) by mouth every 12 (twelve) hours as needed for severe pain. 30 tablet 0 Unknown at prn    Assessment: Pharmacy consulted for heparin drip management for 73 year old female with permanent atrial fibrillation and chronic diastolic heart failure followed by Dr. Rockey Situ who presented to the emergency room due to abdominal pain and found to have acute appendicitis and atrial fibrillation with RVR. Patient previously on apixaban PTA with last dose 02/20 in the evening.   Goal of Therapy:  Heparin level 0.3-0.7 units/ml aPTT  66-102s Monitor platelets by anticoagulation protocol: Yes   Plan:  02/25 @ 0500 aPTT 78 seconds therapeutic. HL still not correlating, will continue current rate and will recheck aPTT @ 1300 and HL w/ am labs to assess correlation. CBC stable will continue to monitor.  Tobie Lords, PharmD, BCPS Clinical Pharmacist 02/23/2019 6:14 AM

## 2019-02-23 NOTE — Progress Notes (Signed)
[facility] Hospital Day(s): 4.   Post op day(s): 4 Days Post-Op.   Interval History: Patient seen and examined, patient resting comfortable.  She still confused but in no distress and no anxiety.  She is still tender to the patient in the lower quadrants of the abdomen.  Denies nausea.  Denies passing flatus but refers that she is unable to say.  Her heart rate has been improving with calcium channel blockers.  There is no chest pain or shortness of breath.   Vital signs in last 24 hours: [min-max] current  Temp:  [98.2 F (36.8 C)-98.7 F (37.1 C)] 98.3 F (36.8 C) (02/25 0800) Pulse Rate:  [101-130] 101 (02/25 1400) Resp:  [14-25] 17 (02/25 1400) BP: (103-158)/(73-116) 155/101 (02/25 1400) SpO2:  [90 %-99 %] 94 % (02/25 1400) Weight:  [100.2 kg] 100.2 kg (02/24 1500)     Height: 5\' 6"  (167.6 cm) Weight: 100.2 kg BMI (Calculated): 35.67   Physical Exam:  Constitutional: alert, cooperative and no distress  Respiratory: breathing non-labored at rest  Cardiovascular: Irregular rate and rhythm Gastrointestinal: soft, non-tender, and non-distended.  Wound dry and clean  Labs:  CBC Latest Ref Rng & Units 02/23/2019 02/22/2019 02/21/2019  WBC 4.0 - 10.5 K/uL 6.5 9.9 12.5(H)  Hemoglobin 12.0 - 15.0 g/dL 11.0(L) 11.3(L) 11.8(L)  Hematocrit 36.0 - 46.0 % 36.2 37.2 40.7  Platelets 150 - 400 K/uL 200 223 154   CMP Latest Ref Rng & Units 02/23/2019 02/22/2019 02/21/2019  Glucose 70 - 99 mg/dL 108(H) 105(H) 116(H)  BUN 8 - 23 mg/dL 12 15 16   Creatinine 0.44 - 1.00 mg/dL 0.68 0.69 0.77  Sodium 135 - 145 mmol/L 136 138 138  Potassium 3.5 - 5.1 mmol/L 4.2 4.4 4.3  Chloride 98 - 111 mmol/L 105 106 109  CO2 22 - 32 mmol/L 26 24 21(L)  Calcium 8.9 - 10.3 mg/dL 8.4(L) 8.8(L) 8.5(L)  Total Protein 6.5 - 8.1 g/dL - 6.3(L) 6.1(L)  Total Bilirubin 0.3 - 1.2 mg/dL - 0.7 0.8  Alkaline Phos 38 - 126 U/L - 53 50  AST 15 - 41 U/L - 16 19  ALT 0 - 44 U/L - 9 10    Imaging studies:  Abdominal x-rays show adequate air in the large intestine all the way down to the descending colon.   Assessment/Plan:  73 y.o.femalewith acute perforated appendicitis4 Day Post-Ops/p laparoscopic converted to open appendectomy, complicated by pertinent comorbidities includingsepsis, atrial fibrillation on anticoagulation, COPD, rheumatoid arthritis. Patient recovering very slowly.  From surgical standpoint the patient has expected abdominal pain on palpation but no abdominal distention.  Abdominal x-ray shows adequate gas pattern.  I recommend to start clear liquid diet and assess for toleration.  Patient continue with A. fib but with improving control.  Agree and appreciate cardiology recommendation.  I also agree with current critical care management by ICU team.  We will continue to follow giving recommendations to help with the care of the patient.  Arnold Long, MD

## 2019-02-23 NOTE — Progress Notes (Signed)
PT Cancellation Note  Patient Details Name: Brandy Armstrong MRN: [id number] DOB: 1946/05/05   Cancelled Treatment:    Reason Eval/Treat Not Completed: Medical issues which prohibited therapy Pt has had consistent heart rates >100 (up to 130s) and elevated BP (with diastolics typically > 475).  Cardiac is working with meds (has ileus), appears that she is going to be starting heparin drip.  Will hold PT today and reassess at later date when pt is more appropriate.   Kreg Shropshire, DPT 02/23/2019, 2:23 PM

## 2019-02-24 DIAGNOSIS — J9601 Acute respiratory failure with hypoxia: Secondary | ICD-10-CM

## 2019-02-24 DIAGNOSIS — K567 Ileus, unspecified: Secondary | ICD-10-CM

## 2019-02-24 LAB — CBC
HCT: 35.6 % — ABNORMAL LOW (ref 36.0–46.0)
Hemoglobin: 11.1 g/dL — ABNORMAL LOW (ref 12.0–15.0)
MCH: 27.9 pg (ref 26.0–34.0)
MCHC: 31.2 g/dL (ref 30.0–36.0)
MCV: 89.4 fL (ref 80.0–100.0)
Platelets: 200 10*3/uL (ref 150–400)
RBC: 3.98 MIL/uL (ref 3.87–5.11)
RDW: 14.8 % (ref 11.5–15.5)
WBC: 7.1 10*3/uL (ref 4.0–10.5)
nRBC: 0 % (ref 0.0–0.2)

## 2019-02-24 LAB — BASIC METABOLIC PANEL
Anion gap: 10 (ref 5–15)
BUN: 9 mg/dL (ref 8–23)
CO2: 21 mmol/L — ABNORMAL LOW (ref 22–32)
CREATININE: 0.67 mg/dL (ref 0.44–1.00)
Calcium: 8.2 mg/dL — ABNORMAL LOW (ref 8.9–10.3)
Chloride: 105 mmol/L (ref 98–111)
GFR calc Af Amer: >60 mL/min (ref >60)
GFR calc non Af Amer: >60 mL/min (ref >60)
Glucose, Bld: 105 mg/dL — ABNORMAL HIGH (ref 70–99)
Potassium: 3.4 mmol/L — ABNORMAL LOW (ref 3.5–5.1)
Sodium: 136 mmol/L (ref 135–145)

## 2019-02-24 LAB — PHOSPHORUS: Phosphorus: 3.8 mg/dL (ref 2.5–4.6)

## 2019-02-24 LAB — APTT
aPTT: 103 seconds — ABNORMAL HIGH (ref 24–36)
aPTT: 74 seconds — ABNORMAL HIGH (ref 24–36)
aPTT: 71 seconds — ABNORMAL HIGH (ref 24–36)

## 2019-02-24 LAB — MAGNESIUM: Magnesium: 1.9 mg/dL (ref 1.7–2.4)

## 2019-02-24 LAB — HEPARIN LEVEL (UNFRACTIONATED): Heparin Unfractionated: 0.96 IU/mL — ABNORMAL HIGH (ref 0.30–0.70)

## 2019-02-24 MED ORDER — ESMOLOL HCL-SODIUM CHLORIDE 2500 MG/250ML IV SOLN
25.0000 ug/kg/min | INTRAVENOUS | Status: DC
  Administered 2019-02-24: 175 ug/kg/min via INTRAVENOUS
  Administered 2019-02-24: 300 ug/kg/min via INTRAVENOUS
  Administered 2019-02-24: 250 ug/kg/min via INTRAVENOUS
  Administered 2019-02-24: 220 ug/kg/min via INTRAVENOUS
  Filled 2019-02-24 (×4): qty 250

## 2019-02-24 MED ORDER — ESMOLOL HCL-SODIUM CHLORIDE 2000 MG/100ML IV SOLN
25.0000 ug/kg/min | INTRAVENOUS | Status: DC
  Administered 2019-02-24: 125 ug/kg/min via INTRAVENOUS
  Administered 2019-02-24: 100 ug/kg/min via INTRAVENOUS
  Administered 2019-02-24 – 2019-02-25 (×4): 125 ug/kg/min via INTRAVENOUS
  Filled 2019-02-24 (×6): qty 100

## 2019-02-24 MED ORDER — KCL-LACTATED RINGERS-D5W 20 MEQ/L IV SOLN
INTRAVENOUS | Status: DC
  Administered 2019-02-24: 15:00:00 via INTRAVENOUS
  Filled 2019-02-24 (×2): qty 1000

## 2019-02-24 MED ORDER — MAGNESIUM SULFATE IN D5W 1-5 GM/100ML-% IV SOLN
1.0000 g | Freq: Once | INTRAVENOUS | Status: AC
  Administered 2019-02-24: 1 g via INTRAVENOUS
  Filled 2019-02-24: qty 100

## 2019-02-24 NOTE — Progress Notes (Signed)
PT Cancellation Note  Patient Details Name: Brandy Armstrong MRN: [id number] DOB: [birth year]   Cancelled Treatment:    Reason Eval/Treat Not Completed: Patient's level of consciousness(Chart reviewed, RN consulted, who reports pt is not appropriate for PT at this time, remains discoriented and confused, not follow commands well. Will attempt evaluation again at later date/time once patient is more appropriate. )  11:15 AM, 02/24/19 [name], PT, DPT Physical Therapist - [facility]  [phone] ([place])    Buccola,Allan C 02/24/2019, 11:15 AM

## 2019-02-24 NOTE — Progress Notes (Addendum)
[facility] for Heparin  Indication: atrial fibrillation  Allergies  Allergen Reactions  . Latex     Itching Itching    Patient Measurements: Height: 5\' 6"  (167.6 cm) Weight: 220 lb 14.4 oz (100.2 kg) IBW/kg (Calculated) : 59.3 Heparin Dosing Weight: 82 kg   Vital Signs: Temp: 98.3 F (36.8 C) (02/26 0128) Temp Source: Oral (02/25 2000) BP: 162/91 (02/26 0600) Pulse Rate: 112 (02/26 0600)  Labs: Recent Labs    02/22/19 0641  02/23/19 0522 02/23/19 1256 02/24/19 0526  HGB 11.3*  --  11.0*  --  11.1*  HCT 37.2  --  36.2  --  35.6*  PLT 223  --  200  --  200  APTT 90*   < > 78* 66* 103*  HEPARINUNFRC 1.23*  --  1.02*  --  0.96*  CREATININE 0.69  --  0.68  --  0.67   < > = values in this interval not displayed.    Estimated Creatinine Clearance: 78.2 mL/min (by C-G formula based on SCr of 0.67 mg/dL).   Medical History: Past Medical History:  Diagnosis Date  . Barretts esophagus   . Chest pain    a. 02/2014 Myoview: Ef 50%, no ischemia.  . Colon polyps   . COPD (chronic obstructive pulmonary disease) ([place])   . Depression with anxiety   . GERD (gastroesophageal reflux disease)   . Neuropathy   . Permanent atrial fibrillation    a. s/p ablation 01/2012 in [facility] by Dr. Boyd Kerbs;  b. On sotalol & Xarelto;  c. 02/2014 Echo: EF 50-55%, mild conc LVH, nl LA size/structure;  d. Recurrent afib 8/15 & 08/29/2014.  [name] Rectal fistula   . Rheumatoid arthritis ([place])    On methotrexate and orencia  . Urinary incontinence   . Vitamin D deficiency     Medications:  Medications Prior to Admission  Medication Sig Dispense Refill Last Dose  . ELIQUIS 5 MG TABS tablet Take 1 tablet (5 mg total) by mouth 2 (two) times daily. 180 tablet 3 02/18/2019 at 0900  . esomeprazole (NEXIUM) 20 MG capsule Take 20 mg by mouth daily at 12 noon.   02/18/2019 at 1200  . Fluticasone-Umeclidin-Vilant (TRELEGY ELLIPTA) 100-62.5-25 MCG/INH AEPB Inhale 1 puff  into the lungs daily. 60 each 2 02/19/2019 at 0530  . gabapentin (NEURONTIN) 300 MG capsule Take 1 capsule (300 mg total) by mouth 3 (three) times daily. 90 capsule 3 02/18/2019 at 2000  . HUMIRA PEN 40 MG/0.4ML PNKT Inject 40 mg as directed. Every 2 weeks   Past Month at Unknown time  . hydroxychloroquine (PLAQUENIL) 200 MG tablet Take 200 mg by mouth 2 (two) times daily.    02/18/2019 at 2000  . propranolol ER (INDERAL LA) 80 MG 24 hr capsule Take 1 capsule (80 mg total) by mouth daily. 90 capsule 3 02/18/2019 at 0800  . rosuvastatin (CRESTOR) 10 MG tablet Take 1 tablet (10 mg total) by mouth daily. 90 tablet 3 02/18/2019 at 2000  . albuterol (PROVENTIL HFA;VENTOLIN HFA) 108 (90 Base) MCG/ACT inhaler Inhale 1-2 puffs into the lungs every 4 (four) hours as needed for wheezing or shortness of breath. 1 Inhaler 10 Unknown at prn  . furosemide (LASIX) 20 MG tablet Take 1 tablet (20 mg total) by mouth daily as needed. 90 tablet 2 Unknown at prn  . ibuprofen (ADVIL,MOTRIN) 400 MG tablet Take 400 mg by mouth every 4 (four) hours as needed.    Unknown at  prn  . lidocaine (XYLOCAINE) 5 % ointment Apply 1 application topically as needed.   Unknown at prn  . potassium chloride (K-DUR) 10 MEQ tablet Take 1 tablet (10 mEq total) by mouth daily as needed. 90 tablet 2 Unknown at prn  . traMADol (ULTRAM) 50 MG tablet Take 1 tablet (50 mg total) by mouth every 12 (twelve) hours as needed for severe pain. 30 tablet 0 Unknown at prn    Assessment: Pharmacy consulted for heparin drip management for 73 year old female with permanent atrial fibrillation and chronic diastolic heart failure followed by Dr. Rockey Situ who presented to the emergency room due to abdominal pain and found to have acute appendicitis and atrial fibrillation with RVR. Patient previously on apixaban PTA with last dose 02/20 in the evening.   Goal of Therapy:  Heparin level 0.3-0.7 units/ml aPTT 66-102s Monitor platelets by anticoagulation protocol:  Yes   Plan:   Continue heparin at 1050 units/hr. Obtain confirmatory aPTT at 2100.   Will monitor anti-Xa levels daily until aPTT and anti-Xa levels correlate.   Pharmacy will continue to monitor and adjust per consult.   MLS 02/24/2019

## 2019-02-24 NOTE — Progress Notes (Signed)
Progress Note  Patient Name: Brandy Armstrong Date of Encounter: 02/24/2019  Primary Cardiologist: Ida Rogue, MD   Subjective   Patient continues to not be alert and oriented. Unable to obtain ROS or assess if complaints of chest pain, palpitations, or racing heart rate.   Inpatient Medications    Scheduled Meds: . budesonide (PULMICORT) nebulizer solution  0.25 mg Nebulization Q12H  . chlorhexidine gluconate (MEDLINE KIT)  15 mL Mouth Rinse BID  . ipratropium  0.5 mg Nebulization Q6H  . levalbuterol  0.63 mg Nebulization Q6H  . metoprolol tartrate  10 mg Intravenous Q6H   Continuous Infusions: . dextrose 5% lactated ringers 50 mL/hr at 02/23/19 1200  . esmolol 220 mcg/kg/min (02/24/19 0717)  . famotidine (PEPCID) IV Stopped (02/23/19 2235)  . heparin 1,050 Units/hr (02/24/19 0717)  . piperacillin-tazobactam (ZOSYN)  IV 3.375 g (02/24/19 0533)   PRN Meds: acetaminophen **OR** acetaminophen, fentaNYL (SUBLIMAZE) injection, HYDROcodone-acetaminophen, ondansetron **OR** ondansetron (ZOFRAN) IV   Vital Signs    Vitals:   02/24/19 0400 02/24/19 0500 02/24/19 0600 02/24/19 0724  BP: (!) 145/98 (!) 147/84 (!) 162/91   Pulse: (!) 112 (!) 111 (!) 112   Resp: 14 20 (!) 25   Temp:    98.2 F (36.8 C)  TempSrc:    Oral  SpO2: 98% (!) 89% 95%   Weight:      Height:        Intake/Output Summary (Last 24 hours) at 02/24/2019 0757 Last data filed at 02/24/2019 0717 Gross per 24 hour  Intake 2627.42 ml  Output 1345 ml  Net 1282.42 ml   Last 3 Weights 02/22/2019 02/10/2019 02/05/2019  Weight (lbs) 220 lb 14.4 oz 221 lb 3.2 oz 220 lb 4 oz  Weight (kg) 100.2 kg 100.336 kg 99.905 kg      Telemetry    Atrial fibrillation with rapid ventricular response.  Rates uncontrolled and 110s when asleep, up to 150s when awake and between boluses - Personally Reviewed  ECG     No new tracings- Personally Reviewed  Physical Exam   GEN: Not oriented. NAD. Neck: No JVD Cardiac:   Irregularly irregular and tachycardic, no murmurs, rubs, or gallops.  Respiratory: Clear to auscultation bilaterally, diminished bibasilar breath sounds GI: Soft, not TTP, non-distended  MS: No bilateral lower extremity edema; No deformity. On SCDs Neuro:  Does not appear as oriented as outpatient documentation indicates is abseline -nursing staff indicated that it was the same overnight Psych: Normal affect, distressed when not able to communicate  Aline  Lab 02/19/19 0633  02/21/19 0443 02/22/19 0641 02/23/19 0522 02/24/19 0526  NA 138  --  138 138 136 136  K 3.3*   < > 4.3 4.4 4.2 3.4*  CL 102  --  109 106 105 105  CO2 27  --  21* 24 26 21*  GLUCOSE 138*  --  116* 105* 108* 105*  BUN 11  --  '16 15 12 9  '$ CREATININE 0.80  --  0.77 0.69 0.68 0.67  CALCIUM 8.8*  --  8.5* 8.8* 8.4* 8.2*  PROT 6.6  --  6.1* 6.3*  --   --   ALBUMIN 3.3*  --  2.8* 2.7*  --   --   AST 22  --  19 16  --   --   ALT 11  --  10 9  --   --   ALKPHOS 68  --  50 53  --   --  BILITOT 0.7  --  0.8 0.7  --   --   GFRNONAA >60  --  >60 >60 >60 >60  GFRAA >60  --  >60 >60 >60 >60  ANIONGAP 9  --  '8 8 5 10   '$ < > = values in this interval not displayed.     Hematology Recent Labs  Lab 02/22/19 0641 02/23/19 0522 02/24/19 0526  WBC 9.9 6.5 7.1  RBC 3.98 3.99 3.98  HGB 11.3* 11.0* 11.1*  HCT 37.2 36.2 35.6*  MCV 93.5 90.7 89.4  MCH 28.4 27.6 27.9  MCHC 30.4 30.4 31.2  RDW 15.1 15.0 14.8  PLT 223 200 200    Cardiac EnzymesNo results for input(s): TROPONINI in the last 168 hours. No results for input(s): TROPIPOC in the last 168 hours.   BNPNo results for input(s): BNP, PROBNP in the last 168 hours.   DDimer No results for input(s): DDIMER in the last 168 hours.   Radiology    Ct Head Wo Contrast  Result Date: 02/23/2019 CLINICAL DATA:  Altered mental status. Confusion. EXAM: CT HEAD WITHOUT CONTRAST TECHNIQUE: Contiguous axial images were obtained from the base of the  skull through the vertex without intravenous contrast. COMPARISON:  11/07/2015 FINDINGS: Brain: Diffuse cerebral atrophy. Ventricular dilatation consistent with central atrophy. Low-attenuation changes in the deep white matter consistent with small vessel ischemia. No mass-effect or midline shift. No abnormal extra-axial fluid collections. Gray-white matter junctions are distinct. Basal cisterns are not effaced. No acute intracranial hemorrhage. Vascular: Intracranial arterial vascular calcifications are present. Skull: Calvarium appears intact. Sinuses/Orbits: Paranasal sinuses and mastoid air cells are clear. Other: None. IMPRESSION: No acute intracranial abnormalities. Chronic atrophy and small vessel ischemic changes. Electronically Signed   By: Lucienne Capers M.D.   On: 02/23/2019 23:48   Dg Abd Portable 1v  Result Date: 02/23/2019 CLINICAL DATA:  Follow-up ileus EXAM: PORTABLE ABDOMEN - 1 VIEW COMPARISON:  02/22/2019 FINDINGS: Scattered large and small bowel gas is noted similar to that seen on the previous day. No definitive obstruction is seen and this likely represents focal ileus. Surgical drain is noted in right lower quadrant. No free air is noted. IMPRESSION: Stable appearance of the abdomen. Electronically Signed   By: Inez Catalina M.D.   On: 02/23/2019 11:29   Dg Abd Portable 1v  Result Date: 02/22/2019 CLINICAL DATA:  Followup ileus. EXAM: PORTABLE ABDOMEN - 1 VIEW COMPARISON:  Radiographs 02/20/2019 FINDINGS: The lung bases are clear. The NG tube is been removed. Persistent air and stool distended colon and mildly air distended small bowel loops. Findings consistent with a persistent ileus. No free air. There appears to be a new intraperitoneal catheter in the pelvis. IMPRESSION: Persistent ileus bowel gas pattern. Electronically Signed   By: Marijo Sanes M.D.   On: 02/22/2019 12:50    Cardiac Studies   2D Echocardiogram 2.22.2020  1. The left ventricle has hyperdynamic systolic  function, with an ejection fraction of >65%. The cavity size was normal. There is mildly increased left ventricular wall thickness. Left ventricular diastology could not be evaluated secondary to atrial  fibrillation. 2. The right ventricle has normal systolic function. The cavity was normal. There is no increase in right ventricular wall thickness. 3. The mitral valve is normal in structure. 4. The tricuspid valve is normal in structure. 5. The aortic valve is normal in structure. Mild thickening of the aortic valve no stenosis of the aortic valve. 6. The interatrial septum was not assessed.  Patient Profile  73 y.o. female with a hx of permanent Afib on Eliquis s/p ablation in 2013 with recurrent Afib s/p repeat ablation in 01/2015 s/p DCCV in 02/2015 s/p DCCV in 08/2016 with recurrent Afib 1 month later leading to the discontinuation of amiodarone and rate control strategy since, aortic atherosclerosis, chronic diastolic CHF, GI bleed on Xarelto, COPD secondary to tobacco abuse quitting in 2012, and anemia, who was admitted 2/21 with acute appendicitis and rapid afib. Now s/p open appendectomy.  Assessment & Plan    1.  Atrial fibrillation with rapid ventricular response:  - No plans for rhythm control. Continue with rate control as rates still elevated and not at target ventricular rate of 110 or below. Not tolerating oral medications. Esmolol drip can be titrated up to maximum as needed to meet target of 110 or below and as BP allows. IV metoprolol tartrate increased to '10mg'$  IV q6h in hopes of weaning off of esmolol drip as ileus improves and she recovers from her perforated appendicitis. Continue heparin drip with plan to transition to Eliquis once she is tolerating diet / oral medications. No plans for any further invasive procedures or surgeries at this time.  2.  Acute appendicitis status post appendectomy on February 21.  3.  Chronic diastolic heart failure: She appears to be  euvolemic on exam. Consider stopping IV fluids once she is tolerating diet well.    4. Appendicitis, altered mental status: Per IM, surgery  For questions or updates, please contact [facility] Please consult [email or web address] for contact info under        Signed, Arvil Chaco, PA-C  02/24/2019, 7:57 AM

## 2019-02-24 NOTE — Progress Notes (Signed)
[place] [facility] [place] NAME: Audery Wassenaar    MR#:  [id number]  DATE OF BIRTH:  [birth year]  SUBJECTIVE:  CHIEF COMPLAINT:   Chief Complaint  Patient presents with  . Abdominal Pain   No new complaint this morning.  Patient extubated on 02/20/2019 .  Had surgery done due to perforated appendicitis on 02/19/2019.  Patient awake and alert and oriented this morning.   REVIEW OF SYSTEMS:  Review of Systems  Constitutional: Negative for chills and fever.  HENT: Negative for hearing loss and tinnitus.   Eyes: Negative for blurred vision and double vision.  Respiratory: Negative for cough, sputum production and shortness of breath.   Cardiovascular: Negative for chest pain and palpitations.  Gastrointestinal: Negative for abdominal pain, heartburn, nausea and vomiting.  Genitourinary: Negative for dysuria and urgency.  Musculoskeletal: Negative for myalgias and neck pain.  Skin: Negative for itching and rash.  Neurological: Negative for dizziness and headaches.  Psychiatric/Behavioral: Negative for depression and hallucinations.      DRUG ALLERGIES:   Allergies  Allergen Reactions  . Latex     Itching Itching   VITALS:  Blood pressure (!) 149/105, pulse (!) 110, temperature 98.5 F (36.9 C), temperature source Oral, resp. rate 14, height 5\' 6"  (1.676 m), weight 100.2 kg, SpO2 99 %. PHYSICAL EXAMINATION:  Physical Exam  Constitutional: She appears well-developed and well-nourished.  HENT:  Head: Normocephalic and atraumatic.  Eyes: Pupils are equal, round, and reactive to light. Conjunctivae and EOM are normal.  Neck: Normal range of motion. Neck supple. No tracheal deviation present. No thyromegaly present.  Cardiovascular: Normal rate and regular rhythm.  Respiratory: Effort normal and breath sounds normal.  GI: Soft. Bowel sounds are normal. There is no abdominal tenderness.  Musculoskeletal: Normal range of motion.      General: No edema.  Neurological: She is alert. No cranial nerve deficit.  Skin: Skin is warm. No erythema.  Psychiatric: She has a normal mood and affect. Her behavior is normal.   LABORATORY PANEL:  Female CBC Recent Labs  Lab 02/24/19 0526  WBC 7.1  HGB 11.1*  HCT 35.6*  PLT 200   ------------------------------------------------------------------------------------------------------------------ Chemistries  Recent Labs  Lab 02/22/19 0641  02/24/19 0526 02/24/19 1323  NA 138   < > 136  --   K 4.4   < > 3.4*  --   CL 106   < > 105  --   CO2 24   < > 21*  --   GLUCOSE 105*   < > 105*  --   BUN 15   < > 9  --   CREATININE 0.69   < > 0.67  --   CALCIUM 8.8*   < > 8.2*  --   MG 2.2   < >  --  1.9  AST 16  --   --   --   ALT 9  --   --   --   ALKPHOS 53  --   --   --   BILITOT 0.7  --   --   --    < > = values in this interval not displayed.   RADIOLOGY:  Ct Head Wo Contrast  Result Date: 02/23/2019 CLINICAL DATA:  Altered mental status. Confusion. EXAM: CT HEAD WITHOUT CONTRAST TECHNIQUE: Contiguous axial images were obtained from the base of the skull through the vertex without intravenous contrast. COMPARISON:  11/07/2015 FINDINGS: Brain: Diffuse cerebral atrophy. Ventricular  dilatation consistent with central atrophy. Low-attenuation changes in the deep white matter consistent with small vessel ischemia. No mass-effect or midline shift. No abnormal extra-axial fluid collections. Gray-white matter junctions are distinct. Basal cisterns are not effaced. No acute intracranial hemorrhage. Vascular: Intracranial arterial vascular calcifications are present. Skull: Calvarium appears intact. Sinuses/Orbits: Paranasal sinuses and mastoid air cells are clear. Other: None. IMPRESSION: No acute intracranial abnormalities. Chronic atrophy and small vessel ischemic changes. Electronically Signed   By: Lucienne Capers M.D.   On: 02/23/2019 23:48   ASSESSMENT AND PLAN:   73 year old  female with permanent atrial fibrillation and chronic diastolic heart failure followed by Dr. Rockey Situ who presented to the emergency room due to abdominal pain and found to have acute appendicitis and atrial fibrillation with RVR.  1.  Atrial fibrillation with RVR:  Being managed by cardiologist.  Eliquis was placed on hold due to surgery done on 02/19/2019   Patient was on IV metoprolol.  Patient also currently on esmolol drip.  Patient status post ablation and has had cardioversions in the past. Last echocardiogram 2017 showing normal ejection fraction Eliquis currently on hold since patient was not able to take p.o. Patient on heparin drip for now until able to tolerate p.o. intake Being managed by cardiology service  2.  Acute perforated appendicitis:  Patient status post surgery on 02/19/2019.   Being managed by surgical team.  Patient to be started on clear liquid diet today.  3 COPD without signs of exacerbation  4.  Rheumatoid arthritis: Continue outpatient regimen  5.  Hypokalemia: Replaced  DVT prophylaxis; patient currently on heparin drip    All the records are reviewed and case discussed with Care Management/Social Worker. Management plans discussed with the patient, family and they are in agreement.  CODE STATUS: Full Code  TOTAL TIME TAKING CARE OF THIS PATIENT: 36 minutes.   More than 50% of the time was spent in counseling/coordination of care: YES  POSSIBLE D/C IN 2DAYS, DEPENDING ON CLINICAL CONDITION.   Jermaine Tholl M.D on 02/24/2019 at 3:34 PM  Between 7am to 6pm - Pager - [phone]  After 6pm go to [email or web address] - Proofreader  [facility]  Office  [phone]  CC: Primary care physician; [name], FNP  Note: This dictation was prepared with Dragon dictation along with smaller phrase technology. Any transcriptional errors that result from this process are unintentional.

## 2019-02-24 NOTE — Progress Notes (Signed)
Patient disoriented X 4 during the shift; unable to answer any questions and only response is "ok". Can not answer yes or no questions and only follows simple commands intermittently. No improvement in mental status as the night progressed. Patient's HC POA, [name], stopped by at the beginning of shift and stated patient is A/O X 4 and independent at baseline. Patient's heart rate continues to be in the 110's-120's despite esmolol drip and scheduled metoprolol. Patient on room air, O2 WNL, and hypertensive. No drainage from surgical incision; dressing is clean, dry and intact. JP output is minimal. Adequate urine output. Will continue to monitor.

## 2019-02-24 NOTE — Progress Notes (Signed)
ANTICOAGULATION CONSULT NOTE - Initial Consult  Pharmacy Consult for Heparin  Indication: atrial fibrillation  Allergies  Allergen Reactions  . Latex     Itching Itching    Patient Measurements: Height: 5\' 6"  (167.6 cm) Weight: 220 lb 14.4 oz (100.2 kg) IBW/kg (Calculated) : 59.3 Heparin Dosing Weight: 82 kg   Vital Signs: Temp: 98.7 F (37.1 C) (02/26 2000) Temp Source: Axillary (02/26 2000) BP: 121/71 (02/26 2100) Pulse Rate: 104 (02/26 2100)  Labs: Recent Labs    02/22/19 0641  02/23/19 0522  02/24/19 0526 02/24/19 1323 02/24/19 2041  HGB 11.3*  --  11.0*  --  11.1*  --   --   HCT 37.2  --  36.2  --  35.6*  --   --   PLT 223  --  200  --  200  --   --   APTT 90*   < > 78*   < > 103* 74* 71*  HEPARINUNFRC 1.23*  --  1.02*  --  0.96*  --   --   CREATININE 0.69  --  0.68  --  0.67  --   --    < > = values in this interval not displayed.    Estimated Creatinine Clearance: 78.2 mL/min (by C-G formula based on SCr of 0.67 mg/dL).   Medical History: Past Medical History:  Diagnosis Date  . Barretts esophagus   . Chest pain    a. 02/2014 Myoview: Ef 50%, no ischemia.  . Colon polyps   . COPD (chronic obstructive pulmonary disease) ([place])   . Depression with anxiety   . GERD (gastroesophageal reflux disease)   . Neuropathy   . Permanent atrial fibrillation    a. s/p ablation 01/2012 in [facility] by Dr. Boyd Kerbs;  b. On sotalol & Xarelto;  c. 02/2014 Echo: EF 50-55%, mild conc LVH, nl LA size/structure;  d. Recurrent afib 8/15 & 08/29/2014.  [name] Rectal fistula   . Rheumatoid arthritis ([place])    On methotrexate and orencia  . Urinary incontinence   . Vitamin D deficiency     Medications:  Medications Prior to Admission  Medication Sig Dispense Refill Last Dose  . ELIQUIS 5 MG TABS tablet Take 1 tablet (5 mg total) by mouth 2 (two) times daily. 180 tablet 3 02/18/2019 at 0900  . esomeprazole (NEXIUM) 20 MG capsule Take 20 mg by mouth daily at 12 noon.   02/18/2019 at  1200  . Fluticasone-Umeclidin-Vilant (TRELEGY ELLIPTA) 100-62.5-25 MCG/INH AEPB Inhale 1 puff into the lungs daily. 60 each 2 02/19/2019 at 0530  . gabapentin (NEURONTIN) 300 MG capsule Take 1 capsule (300 mg total) by mouth 3 (three) times daily. 90 capsule 3 02/18/2019 at 2000  . HUMIRA PEN 40 MG/0.4ML PNKT Inject 40 mg as directed. Every 2 weeks   Past Month at Unknown time  . hydroxychloroquine (PLAQUENIL) 200 MG tablet Take 200 mg by mouth 2 (two) times daily.    02/18/2019 at 2000  . propranolol ER (INDERAL LA) 80 MG 24 hr capsule Take 1 capsule (80 mg total) by mouth daily. 90 capsule 3 02/18/2019 at 0800  . rosuvastatin (CRESTOR) 10 MG tablet Take 1 tablet (10 mg total) by mouth daily. 90 tablet 3 02/18/2019 at 2000  . albuterol (PROVENTIL HFA;VENTOLIN HFA) 108 (90 Base) MCG/ACT inhaler Inhale 1-2 puffs into the lungs every 4 (four) hours as needed for wheezing or shortness of breath. 1 Inhaler 10 Unknown at prn  . furosemide (LASIX) 20 MG  tablet Take 1 tablet (20 mg total) by mouth daily as needed. 90 tablet 2 Unknown at prn  . ibuprofen (ADVIL,MOTRIN) 400 MG tablet Take 400 mg by mouth every 4 (four) hours as needed.    Unknown at prn  . lidocaine (XYLOCAINE) 5 % ointment Apply 1 application topically as needed.   Unknown at prn  . potassium chloride (K-DUR) 10 MEQ tablet Take 1 tablet (10 mEq total) by mouth daily as needed. 90 tablet 2 Unknown at prn  . traMADol (ULTRAM) 50 MG tablet Take 1 tablet (50 mg total) by mouth every 12 (twelve) hours as needed for severe pain. 30 tablet 0 Unknown at prn    Assessment: Pharmacy consulted for heparin drip management for 73 year old female with permanent atrial fibrillation and chronic diastolic heart failure followed by Dr. Rockey Situ who presented to the emergency room due to abdominal pain and found to have acute appendicitis and atrial fibrillation with RVR. Patient previously on apixaban PTA with last dose 02/20 in the evening.   Goal of  Therapy: Heparin level 0.3-0.7 units/ml aPTT 66-102s Monitor platelets by anticoagulation protocol: Yes  Plan:  Continue heparin at 1050 units/hr. Obtain confirmatory aPTT at 2100.   2/26: aPTT @ 2041 = 71 Will continue pt on current rate and recheck HL and aPTT on 2/27 with AM labs.   Pharmacy will continue to monitor and adjust per consult.   Gianlucas Evenson D 02/24/2019,9:12 PM

## 2019-02-25 ENCOUNTER — Inpatient Hospital Stay: Payer: Medicare Other

## 2019-02-25 LAB — BASIC METABOLIC PANEL
Anion gap: 5 (ref 5–15)
BUN: 7 mg/dL — ABNORMAL LOW (ref 8–23)
CALCIUM: 8.3 mg/dL — AB (ref 8.9–10.3)
CO2: 22 mmol/L (ref 22–32)
Chloride: 109 mmol/L (ref 98–111)
Creatinine, Ser: 0.66 mg/dL (ref 0.44–1.00)
GFR calc Af Amer: >60 mL/min (ref >60)
GFR calc non Af Amer: >60 mL/min (ref >60)
Glucose, Bld: 102 mg/dL — ABNORMAL HIGH (ref 70–99)
Potassium: 3.4 mmol/L — ABNORMAL LOW (ref 3.5–5.1)
SODIUM: 136 mmol/L (ref 135–145)

## 2019-02-25 LAB — GLUCOSE, CAPILLARY
Glucose-Capillary: 92 mg/dL (ref 70–99)
Glucose-Capillary: 91 mg/dL (ref 70–99)

## 2019-02-25 LAB — CBC
HCT: 35.7 % — ABNORMAL LOW (ref 36.0–46.0)
Hemoglobin: 11.1 g/dL — ABNORMAL LOW (ref 12.0–15.0)
MCH: 27.9 pg (ref 26.0–34.0)
MCHC: 31.1 g/dL (ref 30.0–36.0)
MCV: 89.7 fL (ref 80.0–100.0)
Platelets: 196 10*3/uL (ref 150–400)
RBC: 3.98 MIL/uL (ref 3.87–5.11)
RDW: 14.7 % (ref 11.5–15.5)
WBC: 6.7 10*3/uL (ref 4.0–10.5)
nRBC: 0 % (ref 0.0–0.2)

## 2019-02-25 LAB — APTT: aPTT: 71 seconds — ABNORMAL HIGH (ref 24–36)

## 2019-02-25 LAB — PHOSPHORUS: Phosphorus: 3.6 mg/dL (ref 2.5–4.6)

## 2019-02-25 LAB — HEPARIN LEVEL (UNFRACTIONATED)
HEPARIN UNFRACTIONATED: 0.58 [IU]/mL (ref 0.30–0.70)
Heparin Unfractionated: 0.52 IU/mL (ref 0.30–0.70)

## 2019-02-25 LAB — MAGNESIUM: Magnesium: 2 mg/dL (ref 1.7–2.4)

## 2019-02-25 MED ORDER — POTASSIUM CHLORIDE 10 MEQ/100ML IV SOLN
10.0000 meq | INTRAVENOUS | Status: AC
  Administered 2019-02-25 (×3): 10 meq via INTRAVENOUS
  Filled 2019-02-25 (×3): qty 100

## 2019-02-25 MED ORDER — POTASSIUM CHLORIDE 2 MEQ/ML IV SOLN
INTRAVENOUS | Status: DC
  Filled 2019-02-25: qty 1000

## 2019-02-25 MED ORDER — IPRATROPIUM BROMIDE 0.02 % IN SOLN
0.5000 mg | Freq: Two times a day (BID) | RESPIRATORY_TRACT | Status: DC
  Administered 2019-02-25 – 2019-03-05 (×16): 0.5 mg via RESPIRATORY_TRACT
  Filled 2019-02-25 (×16): qty 2.5

## 2019-02-25 MED ORDER — LEVALBUTEROL HCL 0.63 MG/3ML IN NEBU
0.6300 mg | INHALATION_SOLUTION | Freq: Two times a day (BID) | RESPIRATORY_TRACT | Status: DC
  Administered 2019-02-25 – 2019-03-05 (×15): 0.63 mg via RESPIRATORY_TRACT
  Filled 2019-02-25 (×16): qty 3

## 2019-02-25 MED ORDER — MIDAZOLAM HCL 2 MG/2ML IJ SOLN
INTRAMUSCULAR | Status: AC
  Filled 2019-02-25: qty 2

## 2019-02-25 MED ORDER — POTASSIUM CHLORIDE 10 MEQ/100ML IV SOLN
10.0000 meq | INTRAVENOUS | Status: AC
  Administered 2019-02-25 (×2): 10 meq via INTRAVENOUS
  Filled 2019-02-25 (×2): qty 100

## 2019-02-25 MED ORDER — ESMOLOL HCL-SODIUM CHLORIDE 2500 MG/250ML IV SOLN
25.0000 ug/kg/min | INTRAVENOUS | Status: DC
  Administered 2019-02-25: 125 ug/kg/min via INTRAVENOUS
  Administered 2019-02-25 (×2): 150 ug/kg/min via INTRAVENOUS
  Administered 2019-02-25: 100 ug/kg/min via INTRAVENOUS
  Administered 2019-02-25: 125 ug/kg/min via INTRAVENOUS
  Administered 2019-02-26 (×2): 200 ug/kg/min via INTRAVENOUS
  Administered 2019-02-26: 207.917 ug/kg/min via INTRAVENOUS
  Administered 2019-02-26: 175 ug/kg/min via INTRAVENOUS
  Filled 2019-02-25 (×11): qty 250
  Filled 2019-02-25: qty 100
  Filled 2019-02-25 (×2): qty 250

## 2019-02-25 MED ORDER — MIDAZOLAM HCL 2 MG/2ML IJ SOLN: 2.0000 mg | Freq: Once | INTRAMUSCULAR | Status: DC

## 2019-02-25 NOTE — Progress Notes (Signed)
Brandy Armstrong at [place] NAME: Brandy Armstrong    MR#:  [id number]  DATE OF BIRTH:  November 28, 1946  SUBJECTIVE:  CHIEF COMPLAINT:   Chief Complaint  Patient presents with  . Abdominal Pain   Patient status post recent surgery for perforated appendicitis done on 01/19/2019. Patient extubated on 02/20/2019 .  This morning patient noted to be more confused and not following instructions.  Reported to be pocketing food.  Still n.p.o.  MRI of the brain already ordered for further evaluation.  REVIEW OF SYSTEMS:  Review of Systems  Constitutional: Negative for chills and fever.  HENT: Negative for hearing loss and tinnitus.   Eyes: Negative for blurred vision and double vision.  Respiratory: Negative for cough, sputum production and shortness of breath.   Cardiovascular: Negative for chest pain and palpitations.  Gastrointestinal: Negative for abdominal pain, heartburn, nausea and vomiting.  Genitourinary: Negative for dysuria and urgency.  Musculoskeletal: Negative for myalgias and neck pain.  Skin: Negative for itching and rash.  Neurological: Negative for dizziness and headaches.  Psychiatric/Behavioral: Negative for depression and hallucinations.      DRUG ALLERGIES:   Allergies  Allergen Reactions  . Latex     Itching Itching   VITALS:  Blood pressure (!) 168/133, pulse (!) 122, temperature 98.5 F (36.9 C), temperature source Axillary, resp. rate (!) 22, height 5\' 6"  (1.676 m), weight 100.2 kg, SpO2 94 %. PHYSICAL EXAMINATION:  Physical Exam  Constitutional: She appears well-developed and well-nourished.  HENT:  Head: Normocephalic and atraumatic.  Eyes: Pupils are equal, round, and reactive to light. Conjunctivae and EOM are normal.  Neck: Normal range of motion. Neck supple. No tracheal deviation present. No thyromegaly present.  Cardiovascular: Normal rate and regular rhythm.  Respiratory: Effort normal and breath sounds normal.   GI: Soft. Bowel sounds are normal. There is no abdominal tenderness.  Musculoskeletal: Normal range of motion.        General: No edema.  Neurological:  Patient disoriented.  Not following instructions.  Full neuro examination not completed  Skin: Skin is warm. No erythema.  Psychiatric: She has a normal mood and affect. Her behavior is normal.   LABORATORY PANEL:  Female CBC Recent Labs  Lab 02/25/19 0342  WBC 6.7  HGB 11.1*  HCT 35.7*  PLT 196   ------------------------------------------------------------------------------------------------------------------ Chemistries  Recent Labs  Lab 02/22/19 0641  02/25/19 0342  NA 138   < > 136  K 4.4   < > 3.4*  CL 106   < > 109  CO2 24   < > 22  GLUCOSE 105*   < > 102*  BUN 15   < > 7*  CREATININE 0.69   < > 0.66  CALCIUM 8.8*   < > 8.3*  MG 2.2   < > 2.0  AST 16  --   --   ALT 9  --   --   ALKPHOS 53  --   --   BILITOT 0.7  --   --    < > = values in this interval not displayed.   RADIOLOGY:  Mr Brain Wo Contrast  Result Date: 02/25/2019 CLINICAL DATA:  Altered mental status since recent surgical procedure. EXAM: MRI HEAD WITHOUT CONTRAST TECHNIQUE: Multiplanar, multiecho pulse sequences of the brain and surrounding structures were obtained without intravenous contrast. COMPARISON:  Head CT 02/23/2019 FINDINGS: BRAIN: There is no acute infarct, acute hemorrhage, hydrocephalus or extra-axial collection. The midline structures are  normal. No midline shift or other mass effect. Early confluent hyperintense T2-weighted signal of the periventricular and deep white matter, most commonly due to chronic ischemic microangiopathy. There is an old infarct of the left paramedian pons. The cerebral and cerebellar volume are age-appropriate. Hemosiderin deposition at the site of old pontine infarct. VASCULAR: Major intracranial arterial and venous sinus flow voids are normal. SKULL AND UPPER CERVICAL SPINE: Calvarial bone marrow signal is  normal. There is no skull base mass. Visualized upper cervical spine and soft tissues are normal. SINUSES/ORBITS: No fluid levels or advanced mucosal thickening. No mastoid or middle ear effusion. The orbits are normal. IMPRESSION: 1. Atrophy and chronic ischemic microangiopathy without acute intracranial abnormality. 2. 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 Electronically Signed   By: Ulyses Jarred M.D.   On: 02/25/2019 14:20   ASSESSMENT AND PLAN:   73 year old female with permanent atrial fibrillation and chronic diastolic heart failure followed by Dr. Rockey Situ who presented to the emergency room due to abdominal pain and found to have acute appendicitis and atrial fibrillation with RVR.  1.  Atrial fibrillation with RVR:  Being managed by cardiologist.  Eliquis was placed on hold due to surgery done on 02/19/2019   Patient still n.p.o. and not able to take p.o.'s Patient currently on esmolol drip.  Patient status post ablation and has had cardioversions in the past. Last echocardiogram 2017 showing normal ejection fraction Eliquis currently on hold since patient was not able to take p.o. Patient on heparin drip for now until able to tolerate p.o. intake Being managed by cardiology service  2.  Acute perforated appendicitis:  Patient status post surgery on 02/19/2019.   Being managed by surgical team.  Kept n.p.o. because patient noted to be pocketing food.  3 COPD without signs of exacerbation  4.  Rheumatoid arthritis: Continue outpatient regimen  5.  Hypokalemia: Replaced  6.  Acute metabolic encephalopathy. Patient reported to be more confused today.  Disoriented.  Not following instructions.  MRI of the brain done today with atrophy and chronic ischemic changes without acute intracranial abnormalities. Being managed by critical care team.  DVT prophylaxis; patient currently on heparin drip    All the records are reviewed and case discussed with Care Management/Social  Worker. Management plans discussed with the patient, family and they are in agreement.  CODE STATUS: Full Code  TOTAL TIME TAKING CARE OF THIS PATIENT: 37 minutes.   More than 50% of the time was spent in counseling/coordination of care: YES  POSSIBLE D/C IN 2DAYS, DEPENDING ON CLINICAL CONDITION.   Arletta Lumadue M.D on 02/25/2019 at 3:50 PM  Between 7am to 6pm - Pager - [phone]  After 6pm go to [email or web address] - Proofreader  [facility]  Office  [phone]  CC: Primary care physician; [name], FNP  Note: This dictation was prepared with Dragon dictation along with smaller phrase technology. Any transcriptional errors that result from this process are unintentional.

## 2019-02-25 NOTE — Progress Notes (Signed)
[facility] for Heparin  Indication: atrial fibrillation  Allergies  Allergen Reactions  . Latex     Itching Itching    Patient Measurements: Height: 5\' 6"  (167.6 cm) Weight: 220 lb 14.4 oz (100.2 kg) IBW/kg (Calculated) : 59.3 Heparin Dosing Weight: 82 kg   Vital Signs: Temp: 98.5 F (36.9 C) (02/27 1200) Temp Source: Axillary (02/27 1200) BP: 168/133 (02/27 1530) Pulse Rate: 122 (02/27 1530)  Labs: Recent Labs    02/23/19 0522  02/24/19 0526 02/24/19 1323 02/24/19 2041 02/25/19 0342 02/25/19 1157  HGB 11.0*  --  11.1*  --   --  11.1*  --   HCT 36.2  --  35.6*  --   --  35.7*  --   PLT 200  --  200  --   --  196  --   APTT 78*   < > 103* 74* 71* 71*  --   HEPARINUNFRC 1.02*  --  0.96*  --   --  0.58 0.52  CREATININE 0.68  --  0.67  --   --  0.66  --    < > = values in this interval not displayed.    Estimated Creatinine Clearance: 78.2 mL/min (by C-G formula based on SCr of 0.66 mg/dL).   Medical History: Past Medical History:  Diagnosis Date  . Barretts esophagus   . Chest pain    a. 02/2014 Myoview: Ef 50%, no ischemia.  . Colon polyps   . COPD (chronic obstructive pulmonary disease) ([place])   . Depression with anxiety   . GERD (gastroesophageal reflux disease)   . Neuropathy   . Permanent atrial fibrillation    a. s/p ablation 01/2012 in [facility] by Dr. Boyd Kerbs;  b. On sotalol & Xarelto;  c. 02/2014 Echo: EF 50-55%, mild conc LVH, nl LA size/structure;  d. Recurrent afib 8/15 & 08/29/2014.  [name] Rectal fistula   . Rheumatoid arthritis ([place])    On methotrexate and orencia  . Urinary incontinence   . Vitamin D deficiency     Medications:  Medications Prior to Admission  Medication Sig Dispense Refill Last Dose  . ELIQUIS 5 MG TABS tablet Take 1 tablet (5 mg total) by mouth 2 (two) times daily. 180 tablet 3 02/18/2019 at 0900  . esomeprazole (NEXIUM) 20 MG capsule Take 20 mg by mouth daily at 12 noon.   02/18/2019 at 1200   . Fluticasone-Umeclidin-Vilant (TRELEGY ELLIPTA) 100-62.5-25 MCG/INH AEPB Inhale 1 puff into the lungs daily. 60 each 2 02/19/2019 at 0530  . gabapentin (NEURONTIN) 300 MG capsule Take 1 capsule (300 mg total) by mouth 3 (three) times daily. 90 capsule 3 02/18/2019 at 2000  . HUMIRA PEN 40 MG/0.4ML PNKT Inject 40 mg as directed. Every 2 weeks   Past Month at Unknown time  . hydroxychloroquine (PLAQUENIL) 200 MG tablet Take 200 mg by mouth 2 (two) times daily.    02/18/2019 at 2000  . propranolol ER (INDERAL LA) 80 MG 24 hr capsule Take 1 capsule (80 mg total) by mouth daily. 90 capsule 3 02/18/2019 at 0800  . rosuvastatin (CRESTOR) 10 MG tablet Take 1 tablet (10 mg total) by mouth daily. 90 tablet 3 02/18/2019 at 2000  . albuterol (PROVENTIL HFA;VENTOLIN HFA) 108 (90 Base) MCG/ACT inhaler Inhale 1-2 puffs into the lungs every 4 (four) hours as needed for wheezing or shortness of breath. 1 Inhaler 10 Unknown at prn  . furosemide (LASIX) 20 MG tablet Take 1 tablet (20  mg total) by mouth daily as needed. 90 tablet 2 Unknown at prn  . ibuprofen (ADVIL,MOTRIN) 400 MG tablet Take 400 mg by mouth every 4 (four) hours as needed.    Unknown at prn  . lidocaine (XYLOCAINE) 5 % ointment Apply 1 application topically as needed.   Unknown at prn  . potassium chloride (K-DUR) 10 MEQ tablet Take 1 tablet (10 mEq total) by mouth daily as needed. 90 tablet 2 Unknown at prn  . traMADol (ULTRAM) 50 MG tablet Take 1 tablet (50 mg total) by mouth every 12 (twelve) hours as needed for severe pain. 30 tablet 0 Unknown at prn    Assessment: Pharmacy consulted for heparin drip management for 73 year old female with permanent atrial fibrillation and chronic diastolic heart failure followed by Dr. Rockey Situ who presented to the emergency room due to abdominal pain and found to have acute appendicitis and atrial fibrillation with RVR. Patient previously on apixaban PTA with last dose 02/20 in the evening.   Goal of  Therapy: Heparin level 0.3-0.7 units/ml aPTT 66-102s Monitor platelets by anticoagulation protocol: Yes  Plan:  Will continue current rate and will recheck heparin level with am labs.   Pharmacy will continue to monitor and adjust per consult.   MLS 02/25/2019

## 2019-02-25 NOTE — Progress Notes (Signed)
[facility] Hospital Day(s): 6.   Post op day(s): 6 Days Post-Op.   Interval History: Patient seen and examined, no acute events or new complaints overnight.  As per discussed with nurse, patient passing flatus.  She has been tolerating clear liquids without any nausea or vomiting.  Patient continues disoriented.  Vital signs in last 24 hours: [min-max] current  Temp:  [98 F (36.7 C)-98.7 F (37.1 C)] 98.5 F (36.9 C) (02/27 1200) Pulse Rate:  [88-143] 122 (02/27 1530) Resp:  [16-30] 22 (02/27 1530) BP: (121-168)/(60-135) 168/133 (02/27 1530) SpO2:  [93 %-100 %] 94 % (02/27 1530)     Height: 5\' 6"  (167.6 cm) Weight: 100.2 kg BMI (Calculated): 35.67   Physical Exam:  Constitutional: alert, cooperative but disoriented Respiratory: breathing non-labored at rest  Cardiovascular: Irregular rate and rhythm Gastrointestinal: soft, and non-distended  Labs:  CBC Latest Ref Rng & Units 02/25/2019 02/24/2019 02/23/2019  WBC 4.0 - 10.5 K/uL 6.7 7.1 6.5  Hemoglobin 12.0 - 15.0 g/dL 11.1(L) 11.1(L) 11.0(L)  Hematocrit 36.0 - 46.0 % 35.7(L) 35.6(L) 36.2  Platelets 150 - 400 K/uL 196 200 200   CMP Latest Ref Rng & Units 02/25/2019 02/24/2019 02/23/2019  Glucose 70 - 99 mg/dL 102(H) 105(H) 108(H)  BUN 8 - 23 mg/dL 7(L) 9 12  Creatinine 0.44 - 1.00 mg/dL 0.66 0.67 0.68  Sodium 135 - 145 mmol/L 136 136 136  Potassium 3.5 - 5.1 mmol/L 3.4(L) 3.4(L) 4.2  Chloride 98 - 111 mmol/L 109 105 105  CO2 22 - 32 mmol/L 22 21(L) 26  Calcium 8.9 - 10.3 mg/dL 8.3(L) 8.2(L) 8.4(L)  Total Protein 6.5 - 8.1 g/dL - - -  Total Bilirubin 0.3 - 1.2 mg/dL - - -  Alkaline Phos 38 - 126 U/L - - -  AST 15 - 41 U/L - - -  ALT 0 - 44 U/L - - -    Imaging studies: No new pertinent imaging studies   Assessment/Plan:  73 y.o.femalewith acute perforated appendicitis6 Day Post-Ops/p laparoscopic converted to open appendectomy, complicated by pertinent comorbidities includingsepsis, atrial  fibrillation on anticoagulation, COPD, rheumatoid arthritis. Patient continued to be in the ICU due to uncontrolled atrial fibrillation.  There has not been any fever, leukocytosis has continued to be within the normal limits without any increase, and patient is tolerating clear liquids without any nausea or vomiting.  Physical exam very difficult to perform due to patient inability to express and due to disorientation.  There was a sign of flatus yesterday which is a good sign from the GI perspective.  I will recommend to advance diet to full liquids and will continue to assess for toleration.  Advance his diet may help to see if she tolerates oral medications for her A. fib and anticoagulation.  I do not see any sign of persistent sepsis that will require more images from the abdomen.  Arnold Long, MD

## 2019-02-25 NOTE — Progress Notes (Signed)
PT Cancellation Note  Patient Details Name: Brandy Armstrong MRN: [id number] DOB: [birth year]   Cancelled Treatment:    Reason Eval/Treat Not Completed: Medical issues which prohibited therapy Spoke with nursing who reports that pt is disoriented X 4 and is unable to follow even simple instructions.  She does not appear appropriate at this time and has consistently been tachy with HR staying >100.  Will check back later, but it appears today even attempting light in bed exercises could be difficult much less attempts at ambulation/mobility.  Kreg Shropshire, DPT 02/25/2019, 10:00 AM

## 2019-02-25 NOTE — Care Management Note (Signed)
Case Management Note  Patient Details  Name: Brandy Armstrong MRN: [id number] Date of Birth: 03-25-1946  Subjective/Objective:  Patient admitted with acute appendicitis taken to the OR for laparoscopic appendectomy that needed to be converted to open appendectomy on 2/21.  Patient remains in the ICU with altered mental status on esmolol drip requiring continuous monitoring.  Patient is awake with eyes open but not alert or oriented, patient mumbles and groans and moves extremities.  Family members are at the bedside.  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  Family reports that at baseline patient is independent, alert, oriented, lives alone, drives and is very talkative.  Patient will go for MRI of brain today.  RNCM will cont to follow patient progress. Doran Clay RN BSN [phone]                     Action/Plan:   Expected Discharge Date:                  Expected Discharge Plan:     In-House Referral:     Discharge planning Services     Post Acute Care Choice:    Choice offered to:     DME Arranged:    DME Agency:     HH Arranged:    HH Agency:     Status of Service:     If discussed at [place] of Stay Meetings, dates discussed:    Additional Comments:  Shelbie Hutching, RN 02/25/2019, 11:01 AM

## 2019-02-25 NOTE — Progress Notes (Signed)
Pharmacy Electrolyte Monitoring Consult:  Pharmacy consulted to assist in monitoring and replacing electrolytes in this 73 y.o. female admitted on 02/19/2019 with Abdominal Pain   Labs:  Sodium (mmol/Armstrong)  Date Value  02/25/2019 136  01/02/2016 141  01/04/2015 139   Potassium (mmol/Armstrong)  Date Value  02/25/2019 3.4 (Armstrong)  01/04/2015 3.8   Magnesium (mg/dL)  Date Value  02/25/2019 2.0  01/03/2015 1.9   Phosphorus (mg/dL)  Date Value  02/25/2019 3.6   Calcium (mg/dL)  Date Value  02/25/2019 8.3 (Armstrong)   Calcium, Total (mg/dL)  Date Value  01/04/2015 8.5   Albumin (g/dL)  Date Value  02/22/2019 2.7 (Armstrong)  01/02/2016 3.9  08/29/2014 2.6 (Armstrong)    Assessment/Plan: D5LR/20K discontinued during am rounds. Patient previously received potassium 19mEq IV Q1hr x 2 doses. Patient unable to receive oral therapy at this time. Will order additional potassium 47mEq IV Q1hr x 3 doses.   During critical illness, will replace for goal potassium ~ 4 and goal magnesium ~ 2.   Will continue to monitor and adjust per consult.   Brandy Armstrong 02/25/2019 7:20 PM

## 2019-02-25 NOTE — Progress Notes (Signed)
ANTICOAGULATION CONSULT NOTE - Initial Consult  Pharmacy Consult for Heparin  Indication: atrial fibrillation  Allergies  Allergen Reactions  . Latex     Itching Itching    Patient Measurements: Height: 5\' 6"  (167.6 cm) Weight: 220 lb 14.4 oz (100.2 kg) IBW/kg (Calculated) : 59.3 Heparin Dosing Weight: 82 kg   Vital Signs: Temp: 98 F (36.7 C) (02/27 0200) Temp Source: Axillary (02/27 0200) BP: 135/88 (02/27 0300) Pulse Rate: 106 (02/27 0300)  Labs: Recent Labs    02/23/19 0522  02/24/19 0526 02/24/19 1323 02/24/19 2041 02/25/19 0342  HGB 11.0*  --  11.1*  --   --  11.1*  HCT 36.2  --  35.6*  --   --  35.7*  PLT 200  --  200  --   --  196  APTT 78*   < > 103* 74* 71* 71*  HEPARINUNFRC 1.02*  --  0.96*  --   --  0.58  CREATININE 0.68  --  0.67  --   --  0.66   < > = values in this interval not displayed.    Estimated Creatinine Clearance: 78.2 mL/min (by C-G formula based on SCr of 0.66 mg/dL).   Medical History: Past Medical History:  Diagnosis Date  . Barretts esophagus   . Chest pain    a. 02/2014 Myoview: Ef 50%, no ischemia.  . Colon polyps   . COPD (chronic obstructive pulmonary disease) ([place])   . Depression with anxiety   . GERD (gastroesophageal reflux disease)   . Neuropathy   . Permanent atrial fibrillation    a. s/p ablation 01/[facility] in [facility] by Dr. Boyd Kerbs;  b. On sotalol & Xarelto;  c. 02/2014 Echo: EF 50-55%, mild conc LVH, nl LA size/structure;  d. Recurrent afib 8/15 & 08/29/2014.  [name] Rectal fistula   . Rheumatoid arthritis ([place])    On methotrexate and orencia  . Urinary incontinence   . Vitamin D deficiency     Medications:  Medications Prior to Admission  Medication Sig Dispense Refill Last Dose  . ELIQUIS 5 MG TABS tablet Take 1 tablet (5 mg total) by mouth 2 (two) times daily. 180 tablet 3 02/18/2019 at 0900  . esomeprazole (NEXIUM) 20 MG capsule Take 20 mg by mouth daily at 12 noon.   02/18/2019 at 1200  .  Fluticasone-Umeclidin-Vilant (TRELEGY ELLIPTA) 100-62.5-25 MCG/INH AEPB Inhale 1 puff into the lungs daily. 60 each 2 02/19/2019 at 0530  . gabapentin (NEURONTIN) 300 MG capsule Take 1 capsule (300 mg total) by mouth 3 (three) times daily. 90 capsule 3 02/18/2019 at 2000  . HUMIRA PEN 40 MG/0.4ML PNKT Inject 40 mg as directed. Every 2 weeks   Past Month at Unknown time  . hydroxychloroquine (PLAQUENIL) 200 MG tablet Take 200 mg by mouth 2 (two) times daily.    02/18/2019 at 2000  . propranolol ER (INDERAL LA) 80 MG 24 hr capsule Take 1 capsule (80 mg total) by mouth daily. 90 capsule 3 02/18/2019 at 0800  . rosuvastatin (CRESTOR) 10 MG tablet Take 1 tablet (10 mg total) by mouth daily. 90 tablet 3 02/18/2019 at 2000  . albuterol (PROVENTIL HFA;VENTOLIN HFA) 108 (90 Base) MCG/ACT inhaler Inhale 1-2 puffs into the lungs every 4 (four) hours as needed for wheezing or shortness of breath. 1 Inhaler 10 Unknown at prn  . furosemide (LASIX) 20 MG tablet Take 1 tablet (20 mg total) by mouth daily as needed. 90 tablet 2 Unknown at prn  .  ibuprofen (ADVIL,MOTRIN) 400 MG tablet Take 400 mg by mouth every 4 (four) hours as needed.    Unknown at prn  . lidocaine (XYLOCAINE) 5 % ointment Apply 1 application topically as needed.   Unknown at prn  . potassium chloride (K-DUR) 10 MEQ tablet Take 1 tablet (10 mEq total) by mouth daily as needed. 90 tablet 2 Unknown at prn  . traMADol (ULTRAM) 50 MG tablet Take 1 tablet (50 mg total) by mouth every 12 (twelve) hours as needed for severe pain. 30 tablet 0 Unknown at prn    Assessment: Pharmacy consulted for heparin drip management for 73 year old female with permanent atrial fibrillation and chronic diastolic heart failure followed by Dr. Rockey Situ who presented to the emergency room due to abdominal pain and found to have acute appendicitis and atrial fibrillation with RVR. Patient previously on apixaban PTA with last dose 02/20 in the evening.   Goal of Therapy: Heparin  level 0.3-0.7 units/ml aPTT 66-102s Monitor platelets by anticoagulation protocol: Yes  Plan:  02/27 @ 0400 HL 0.58; aPTT 71 seconds both therapeutic and correlating. Will continue current rate and will recheck HL @ 1200, CBC stable will continue to monitor.  Tobie Lords, PharmD, BCPS Clinical Pharmacist 02/25/2019

## 2019-02-25 NOTE — Progress Notes (Signed)
Progress Note  Patient Name: Brandy Armstrong Date of Encounter: 02/25/2019  Primary Cardiologist: Ida Rogue, MD   Subjective   Alert, minimally communicative this morning again Persistent problem since surgery At baseline is conversant  Still on esmolol infusion, rate 110 up to 120, atrial fibrillation  Inpatient Medications    Scheduled Meds: . budesonide (PULMICORT) nebulizer solution  0.25 mg Nebulization Q12H  . chlorhexidine gluconate (MEDLINE KIT)  15 mL Mouth Rinse BID  . ipratropium  0.5 mg Nebulization Q6H  . levalbuterol  0.63 mg Nebulization Q6H  . metoprolol tartrate  10 mg Intravenous Q6H  . midazolam      . midazolam  2 mg Intravenous Once   Continuous Infusions: . esmolol 200 mcg/kg/min (02/25/19 1323)  . famotidine (PEPCID) IV 20 mg (02/25/19 0949)  . heparin 1,050 Units/hr (02/25/19 0757)  . piperacillin-tazobactam (ZOSYN)  IV 3.375 g (02/25/19 0528)   PRN Meds: acetaminophen **OR** acetaminophen, fentaNYL (SUBLIMAZE) injection, HYDROcodone-acetaminophen, ondansetron **OR** ondansetron (ZOFRAN) IV   Vital Signs    Vitals:   02/25/19 1200 02/25/19 1230 02/25/19 1300 02/25/19 1330  BP: (!) 158/118 (!) 158/115 (!) 151/115 (!) 161/135  Pulse: (!) 125 (!) 121 (!) 109 (!) 133  Resp: (!) 26 (!) 27 (!) 26 (!) 30  Temp: 98.5 F (36.9 C)     TempSrc: Axillary     SpO2: 95% 95% 95% 95%  Weight:      Height:        Intake/Output Summary (Last 24 hours) at 02/25/2019 1410 Last data filed at 02/25/2019 1341 Gross per 24 hour  Intake 2066.61 ml  Output 2267 ml  Net -200.39 ml   Last 3 Weights 02/22/2019 02/10/2019 02/05/2019  Weight (lbs) 220 lb 14.4 oz 221 lb 3.2 oz 220 lb 4 oz  Weight (kg) 100.2 kg 100.336 kg 99.905 kg      Telemetry    Atrial fibrillation rate 110-1 20 personally Reviewed  ECG     No new tracings- Personally Reviewed  Physical Exam   GEN:  Alert, not oriented. NAD. Neck: No JVD Cardiac:  Irregularly irregular and  tachycardic, no murmurs, rubs, or gallops.  Respiratory: Clear to auscultation bilaterally, poor inspiratory effort, scattered Rales at the bases GI: Soft, not TTP, non-distended  MS: No bilateral lower extremity edema; No deformity.  Neuro:  Does not appear as oriented as outpatient documentation indicates is abseline -nursing staff indicated that it was the same overnight Psych: Normal affect, distressed when not able to communicate    [place]  Lab 02/19/19 6063  02/21/19 0443 02/22/19 0641 02/23/19 0522 02/24/19 0526 02/25/19 0342  NA 138  --  138 138 136 136 136  K 3.3*   < > 4.3 4.4 4.2 3.4* 3.4*  CL 102  --  109 106 105 105 109  CO2 27  --  21* 24 26 21* 22  GLUCOSE 138*  --  116* 105* 108* 105* 102*  BUN 11  --  '16 15 12 9 '$ 7*  CREATININE 0.80  --  0.77 0.69 0.68 0.67 0.66  CALCIUM 8.8*  --  8.5* 8.8* 8.4* 8.2* 8.3*  PROT 6.6  --  6.1* 6.3*  --   --   --   ALBUMIN 3.3*  --  2.8* 2.7*  --   --   --   AST 22  --  19 16  --   --   --   ALT 11  --  10 9  --   --   --   ALKPHOS 68  --  50 53  --   --   --   BILITOT 0.7  --  0.8 0.7  --   --   --   GFRNONAA >60  --  >60 >60 >60 >60 >60  GFRAA >60  --  >60 >60 >60 >60 >60  ANIONGAP 9  --  '8 8 5 10 5   '$ < > = values in this interval not displayed.     Hematology Recent Labs  Lab 02/23/19 0522 02/24/19 0526 02/25/19 0342  WBC 6.5 7.1 6.7  RBC 3.99 3.98 3.98  HGB 11.0* 11.1* 11.1*  HCT 36.2 35.6* 35.7*  MCV 90.7 89.4 89.7  MCH 27.6 27.9 27.9  MCHC 30.4 31.2 31.1  RDW 15.0 14.8 14.7  PLT 200 200 196    Cardiac EnzymesNo results for input(s): TROPONINI in the last 168 hours. No results for input(s): TROPIPOC in the last 168 hours.   BNPNo results for input(s): BNP, PROBNP in the last 168 hours.   DDimer No results for input(s): DDIMER in the last 168 hours.   Radiology    Ct Head Wo Contrast  Result Date: 02/23/2019 CLINICAL DATA:  Altered mental status. Confusion. EXAM: CT HEAD WITHOUT  CONTRAST TECHNIQUE: Contiguous axial images were obtained from the base of the skull through the vertex without intravenous contrast. COMPARISON:  11/07/2015 FINDINGS: Brain: Diffuse cerebral atrophy. Ventricular dilatation consistent with central atrophy. Low-attenuation changes in the deep white matter consistent with small vessel ischemia. No mass-effect or midline shift. No abnormal extra-axial fluid collections. Gray-white matter junctions are distinct. Basal cisterns are not effaced. No acute intracranial hemorrhage. Vascular: Intracranial arterial vascular calcifications are present. Skull: Calvarium appears intact. Sinuses/Orbits: Paranasal sinuses and mastoid air cells are clear. Other: None. IMPRESSION: No acute intracranial abnormalities. Chronic atrophy and small vessel ischemic changes. Electronically Signed   By: Lucienne Capers M.D.   On: 02/23/2019 23:48    Cardiac Studies   2D Echocardiogram 2.22.2020  1. The left ventricle has hyperdynamic systolic function, with an ejection fraction of >65%. The cavity size was normal. There is mildly increased left ventricular wall thickness. Left ventricular diastology could not be evaluated secondary to atrial  fibrillation. 2. The right ventricle has normal systolic function. The cavity was normal. There is no increase in right ventricular wall thickness. 3. The mitral valve is normal in structure. 4. The tricuspid valve is normal in structure. 5. The aortic valve is normal in structure. Mild thickening of the aortic valve no stenosis of the aortic valve. 6. The interatrial septum was not assessed.  Patient Profile     73 y.o. female with a hx of permanent Afib on Eliquis s/p ablation in 2013 with recurrent Afib s/p repeat ablation in 01/2015 s/p DCCV in 02/2015 s/p DCCV in 08/2016 with recurrent Afib 1 month later leading to the discontinuation of amiodarone and rate control strategy since, aortic atherosclerosis, chronic diastolic CHF,  GI bleed on Xarelto, COPD secondary to tobacco abuse quitting in 2012, and anemia, who was admitted 2/21 with acute appendicitis and rapid afib. Now s/p open appendectomy.  Assessment & Plan    1.  Atrial fibrillation with rapid ventricular response:  Currently not taking oral pills Will need to keep on esmolol infusion for rate control Also on heparin infusion, unable to restart eliquis  2.  Acute appendicitis status post appendectomy on February 21. She does report some mild  tenderness in her abdomen, Poor historian, unable to detail  3.  Chronic diastolic heart failure:  Given elevated rate, atrial fibrillation, Would try to decrease IV fluids    4. Appendicitis, altered mental status:  Per IM, surgery  Case discussed with nursing  Total encounter time more than 25 minutes  Greater than 50% was spent in counseling and coordination of care with the patient   For questions or updates, please contact [place] Please consult [email or web address] for contact info under        Signed, Ida Rogue, MD  02/25/2019, 2:10 PM

## 2019-02-26 LAB — CBC
HCT: 35.2 % — ABNORMAL LOW (ref 36.0–46.0)
Hemoglobin: 11 g/dL — ABNORMAL LOW (ref 12.0–15.0)
MCH: 27.6 pg (ref 26.0–34.0)
MCHC: 31.3 g/dL (ref 30.0–36.0)
MCV: 88.2 fL (ref 80.0–100.0)
Platelets: 199 10*3/uL (ref 150–400)
RBC: 3.99 MIL/uL (ref 3.87–5.11)
RDW: 15 % (ref 11.5–15.5)
WBC: 7.5 10*3/uL (ref 4.0–10.5)
nRBC: 0 % (ref 0.0–0.2)

## 2019-02-26 LAB — BASIC METABOLIC PANEL
Anion gap: 10 (ref 5–15)
BUN: 7 mg/dL — ABNORMAL LOW (ref 8–23)
CO2: 18 mmol/L — ABNORMAL LOW (ref 22–32)
Calcium: 8.4 mg/dL — ABNORMAL LOW (ref 8.9–10.3)
Chloride: 106 mmol/L (ref 98–111)
Creatinine, Ser: 0.6 mg/dL (ref 0.44–1.00)
GFR calc Af Amer: >60 mL/min (ref >60)
GFR calc non Af Amer: >60 mL/min (ref >60)
Glucose, Bld: 69 mg/dL — ABNORMAL LOW (ref 70–99)
Potassium: 3.8 mmol/L (ref 3.5–5.1)
Sodium: 134 mmol/L — ABNORMAL LOW (ref 135–145)

## 2019-02-26 LAB — PHOSPHORUS: PHOSPHORUS: 3.8 mg/dL (ref 2.5–4.6)

## 2019-02-26 LAB — HEPARIN LEVEL (UNFRACTIONATED): HEPARIN UNFRACTIONATED: 0.52 [IU]/mL (ref 0.30–0.70)

## 2019-02-26 LAB — MAGNESIUM: MAGNESIUM: 2 mg/dL (ref 1.7–2.4)

## 2019-02-26 MED ORDER — DEXMEDETOMIDINE HCL IN NACL 400 MCG/100ML IV SOLN
0.4000 ug/kg/h | INTRAVENOUS | Status: DC
  Administered 2019-02-26: 0.4 ug/kg/h via INTRAVENOUS
  Filled 2019-02-26: qty 100

## 2019-02-26 MED ORDER — ESMOLOL HCL-SODIUM CHLORIDE 2000 MG/100ML IV SOLN
25.0000 ug/kg/min | INTRAVENOUS | Status: DC
  Administered 2019-02-26 – 2019-02-28 (×8): 75 ug/kg/min via INTRAVENOUS
  Filled 2019-02-26 (×10): qty 100

## 2019-02-26 MED ORDER — FUROSEMIDE 10 MG/ML IJ SOLN
40.0000 mg | Freq: Once | INTRAMUSCULAR | Status: AC
  Administered 2019-02-26: 40 mg via INTRAVENOUS
  Filled 2019-02-26: qty 4

## 2019-02-26 MED ORDER — POTASSIUM CHLORIDE 10 MEQ/100ML IV SOLN
10.0000 meq | INTRAVENOUS | Status: AC
  Administered 2019-02-26 (×2): 10 meq via INTRAVENOUS
  Filled 2019-02-26 (×2): qty 100

## 2019-02-26 MED ORDER — FUROSEMIDE 10 MG/ML IJ SOLN
40.0000 mg | Freq: Every day | INTRAMUSCULAR | Status: DC
  Administered 2019-02-26 – 2019-03-01 (×3): 40 mg via INTRAVENOUS
  Filled 2019-02-26 (×3): qty 4

## 2019-02-26 NOTE — Progress Notes (Signed)
PT Cancellation Note  Patient Details Name: Brandy Armstrong MRN: [id number] DOB: 03-Jul-1946   Cancelled Treatment:    Reason Eval/Treat Not Completed: Medical issues which prohibited therapy Spoke with nursing who states "Um no, you can just cancel the order and well put one in when she's ready." Spoke with hospitalist too, he is in agreement as she has not been appropriate for 4 consecutive days.  Will complete orders, will need new orders when appropriate.  Kreg Shropshire, DPT 02/26/2019, 4:00 PM

## 2019-02-26 NOTE — Progress Notes (Signed)
[place] Hospital Day(s): 7.   Post op day(s): 7 Days Post-Op.   Interval History: Patient seen and examined, no acute events or new complaints overnight. As per nurse, the patient was holding the diet in her mouth without swallowing and needed to suction the food out of her mouth.  This is due to the patient mental status.  Patient is alert but very disoriented.  Patient does not answer question properly.  Not even her name.  MRI of the brain was done and does not show any acute pathology that explain patient mental status.  No nausea or vomiting.  No bowel movement reported.  Vital signs in last 24 hours: [min-max] current  Temp:  [97.9 F (36.6 C)-99.5 F (37.5 C)] 97.9 F (36.6 C) (02/28 0748) Pulse Rate:  [84-143] 91 (02/28 0748) Resp:  [18-30] 19 (02/28 0748) BP: (117-168)/(65-135) 132/90 (02/28 0748) SpO2:  [92 %-100 %] 95 % (02/28 0748) FiO2 (%):  [0.2 %] 0.2 % (02/27 1938)     Height: 5\' 6"  (167.6 cm) Weight: 100.2 kg BMI (Calculated): 35.67    Physical Exam:  Constitutional: Awake, no distress but completely disoriented Respiratory: breathing non-labored at rest  Cardiovascular: Irregular rate and rhythm Gastrointestinal: soft, non-tender, and non-distended. Wound is dry and clean.  Left lower quadrant drain is serous sanguinolent.  Labs:  CBC Latest Ref Rng & Units 02/26/2019 02/25/2019 02/24/2019  WBC 4.0 - 10.5 K/uL 7.5 6.7 7.1  Hemoglobin 12.0 - 15.0 g/dL 11.0(L) 11.1(L) 11.1(L)  Hematocrit 36.0 - 46.0 % 35.2(L) 35.7(L) 35.6(L)  Platelets 150 - 400 K/uL 199 196 200   CMP Latest Ref Rng & Units 02/26/2019 02/25/2019 02/24/2019  Glucose 70 - 99 mg/dL 69(L) 102(H) 105(H)  BUN 8 - 23 mg/dL 7(L) 7(L) 9  Creatinine 0.44 - 1.00 mg/dL 0.60 0.66 0.67  Sodium 135 - 145 mmol/L 134(L) 136 136  Potassium 3.5 - 5.1 mmol/L 3.8 3.4(L) 3.4(L)  Chloride 98 - 111 mmol/L 106 109 105  CO2 22 - 32 mmol/L 18(L) 22 21(L)  Calcium 8.9 - 10.3 mg/dL 8.4(L) 8.3(L) 8.2(L)   Total Protein 6.5 - 8.1 g/dL - - -  Total Bilirubin 0.3 - 1.2 mg/dL - - -  Alkaline Phos 38 - 126 U/L - - -  AST 15 - 41 U/L - - -  ALT 0 - 44 U/L - - -    Assessment/Plan:  73 y.o.femalewith acute perforated appendicitis7Day Post-Ops/p laparoscopic converted to open appendectomy, complicated by pertinent comorbidities includingatrial fibrillation on anticoagulation, COPD, rheumatoid arthritis. Patient continued to be in the ICU due to uncontrolled atrial fibrillation with esmolol drip and heparin drip.  Patient continued disoriented without any change in her clinical status.  There is no fever, normal white blood cell count, wounds look clean, drains does not show any purulent or fecal content.  There is no sign of infection at this moment.  Patient was tolerating clear liquid but yesterday was not eating most likely due to her mental status.  I agree with another trial today of oral intake.  May be more solid diet can help.  We will continue with frequent abdominal exam and close follow-up.  Arnold Long, MD

## 2019-02-26 NOTE — Progress Notes (Signed)
Brandy Armstrong at [place] NAME: Brandy Armstrong    MR#:  [id number]  DATE OF BIRTH:  1946/05/04  SUBJECTIVE:  CHIEF COMPLAINT:   Chief Complaint  Patient presents with  . Abdominal Pain   Patient status post recent surgery for perforated appendicitis done on 01/19/2019. Patient extubated on 02/20/2019 .  This morning patient still confused and not following instructions.  Reported to be pocketing food.  Still n.p.o.  MRI of the brain with no acute findings. Notified about plans for placement of NG tube to be able to get nutrition and administer meds  REVIEW OF SYSTEMS:  Review of Systems  Constitutional: Negative for chills and fever.  HENT: Negative for hearing loss and tinnitus.   Eyes: Negative for blurred vision and double vision.  Respiratory: Negative for cough, sputum production and shortness of breath.   Cardiovascular: Negative for chest pain and palpitations.  Gastrointestinal: Negative for abdominal pain, heartburn, nausea and vomiting.  Genitourinary: Negative for dysuria and urgency.  Musculoskeletal: Negative for myalgias and neck pain.  Skin: Negative for itching and rash.  Neurological: Negative for dizziness and headaches.  Psychiatric/Behavioral: Negative for depression and hallucinations.      DRUG ALLERGIES:   Allergies  Allergen Reactions  . Latex     Itching Itching   VITALS:  Blood pressure (!) 142/88, pulse (!) 113, temperature 97.9 F (36.6 C), temperature source Axillary, resp. rate (!) 22, height 5\' 6"  (1.676 m), weight 100.2 kg, SpO2 95 %. PHYSICAL EXAMINATION:  Physical Exam  Constitutional: She appears well-developed and well-nourished.  HENT:  Head: Normocephalic and atraumatic.  Eyes: Pupils are equal, round, and reactive to light. Conjunctivae and EOM are normal.  Neck: Normal range of motion. Neck supple. No tracheal deviation present. No thyromegaly present.  Cardiovascular: Normal rate and  regular rhythm.  Respiratory: Effort normal and breath sounds normal.  GI: Soft. Bowel sounds are normal. There is no abdominal tenderness.  Musculoskeletal: Normal range of motion.        General: No edema.  Neurological:  Patient disoriented.  Not following instructions.  Full neuro examination not completed  Skin: Skin is warm. No erythema.  Psychiatric: She has a normal mood and affect. Her behavior is normal.   LABORATORY PANEL:  Female CBC Recent Labs  Lab 02/26/19 0653  WBC 7.5  HGB 11.0*  HCT 35.2*  PLT 199   ------------------------------------------------------------------------------------------------------------------ Chemistries  Recent Labs  Lab 02/22/19 0641  02/26/19 0653  NA 138   < > 134*  K 4.4   < > 3.8  CL 106   < > 106  CO2 24   < > 18*  GLUCOSE 105*   < > 69*  BUN 15   < > 7*  CREATININE 0.69   < > 0.60  CALCIUM 8.8*   < > 8.4*  MG 2.2   < > 2.0  AST 16  --   --   ALT 9  --   --   ALKPHOS 53  --   --   BILITOT 0.7  --   --    < > = values in this interval not displayed.   RADIOLOGY:  No results found. ASSESSMENT AND PLAN:   73 year old female with permanent atrial fibrillation and chronic diastolic heart failure followed by Dr. Rockey Situ who presented to the emergency room due to abdominal pain and found to have acute appendicitis and atrial fibrillation with RVR.  1.  Atrial  fibrillation with RVR:  Being managed by cardiologist.  Eliquis was placed on hold due to surgery done on 02/19/2019   Patient still n.p.o. and not able to take p.o.'s Patient currently on esmolol drip.  Patient status post ablation and has had cardioversions in the past. Last echocardiogram 2017 showing normal ejection fraction Eliquis currently on hold since patient was not able to take p.o. Patient on heparin drip for now until able to tolerate p.o. intake Being managed by cardiology service  2.  Acute perforated appendicitis:  Patient status post surgery on  02/19/2019.   Being managed by surgical team.  Kept n.p.o. because patient noted to be pocketing food. Notified by critical care physician about plans to place NG tube for NG tube feeding today.  3 COPD without signs of exacerbation  4.  Rheumatoid arthritis: Continue outpatient regimen  5.  Hypokalemia: Replaced  6.  Acute metabolic encephalopathy. Patient reported to be more confused .  Disoriented.  Not following instructions.  MRI of the brain done with atrophy and chronic ischemic changes without acute intracranial abnormalities. Being managed by critical care team.  DVT prophylaxis; patient currently on heparin drip    All the records are reviewed and case discussed with Care Management/Social Worker. Management plans discussed with the patient, family and they are in agreement.  CODE STATUS: Full Code  TOTAL TIME TAKING CARE OF THIS PATIENT: 35 minutes.   More than 50% of the time was spent in counseling/coordination of care: YES  POSSIBLE D/C IN 2DAYS, DEPENDING ON CLINICAL CONDITION.   Ly Bacchi M.D on 02/26/2019 at 4:27 PM  Between 7am to 6pm - Pager - [phone]  After 6pm go to [email or web address] - Proofreader  [facility] [place]  Office  [phone]  CC: Primary care physician; [name], FNP  Note: This dictation was prepared with Dragon dictation along with smaller phrase technology. Any transcriptional errors that result from this process are unintentional.

## 2019-02-26 NOTE — Progress Notes (Signed)
Progress Note  Patient Name: Brandy Armstrong Date of Encounter: 02/26/2019  Primary Cardiologist: Ida Rogue, MD   Subjective   minimally communicative this morning , since surgery At baseline is conversant , appropriate Still on esmolol infusion, rate 110 up to 120, atrial fibrillation  Inpatient Medications    Scheduled Meds: . budesonide (PULMICORT) nebulizer solution  0.25 mg Nebulization Q12H  . chlorhexidine gluconate (MEDLINE KIT)  15 mL Mouth Rinse BID  . ipratropium  0.5 mg Nebulization BID  . levalbuterol  0.63 mg Nebulization BID  . metoprolol tartrate  10 mg Intravenous Q6H  . midazolam  2 mg Intravenous Once   Continuous Infusions: . esmolol 75 mcg/kg/min (02/26/19 1823)  . famotidine (PEPCID) IV 20 mg (02/26/19 1144)  . heparin 1,050 Units/hr (02/26/19 1601)  . piperacillin-tazobactam (ZOSYN)  IV 3.375 g (02/26/19 1345)   PRN Meds: acetaminophen **OR** acetaminophen, fentaNYL (SUBLIMAZE) injection, HYDROcodone-acetaminophen, ondansetron **OR** ondansetron (ZOFRAN) IV   Vital Signs    Vitals:   02/26/19 1500 02/26/19 1600 02/26/19 1700 02/26/19 1800  BP: (!) 148/104   116/85  Pulse: (!) 110 (!) 122 90 (!) 116  Resp: 20 (!) 22 (!) 21 19  Temp:      TempSrc:      SpO2: 97% 95% 98% 97%  Weight:      Height:        Intake/Output Summary (Last 24 hours) at 02/26/2019 1912 Last data filed at 02/26/2019 1823 Gross per 24 hour  Intake 2306.25 ml  Output 4230 ml  Net -1923.75 ml   Last 3 Weights 02/22/2019 02/10/2019 02/05/2019  Weight (lbs) 220 lb 14.4 oz 221 lb 3.2 oz 220 lb 4 oz  Weight (kg) 100.2 kg 100.336 kg 99.905 kg      Telemetry    Atrial fibrillation rate 110-1 20 personally Reviewed  ECG     No new tracings- Personally Reviewed  Physical Exam   GEN:  Alert, not oriented. NAD. Neck: + JVD Cardiac:  Irregularly irregular and tachycardic, no murmurs, rubs, or gallops.  Respiratory:  scattered Rales at the bases GI: Soft, not TTP,  non-distended  MS: No bilateral lower extremity edema; No deformity.  Neuro:  Unable to test Psych: Minimally communicative   Labs    Chemistry Recent Labs  Lab 02/21/19 [phone] 02/22/19 0641  02/24/19 0526 02/25/19 0342 02/26/19 0653  NA 138 138   < > 136 136 134*  K 4.3 4.4   < > 3.4* 3.4* 3.8  CL 109 106   < > 105 109 106  CO2 21* 24   < > 21* 22 18*  GLUCOSE 116* 105*   < > 105* 102* 69*  BUN 16 15   < > 9 7* 7*  CREATININE 0.77 0.69   < > 0.67 0.66 0.60  CALCIUM 8.5* 8.8*   < > 8.2* 8.3* 8.4*  PROT 6.1* 6.3*  --   --   --   --   ALBUMIN 2.8* 2.7*  --   --   --   --   AST 19 16  --   --   --   --   ALT 10 9  --   --   --   --   ALKPHOS 50 53  --   --   --   --   BILITOT 0.8 0.7  --   --   --   --   GFRNONAA >60 >60   < > >60 >60 >  60  GFRAA >60 >60   < > >60 >60 >60  ANIONGAP 8 8   < > '10 5 10   '$ < > = values in this interval not displayed.     Hematology Recent Labs  Lab 02/24/19 0526 02/25/19 0342 02/26/19 0653  WBC 7.1 6.7 7.5  RBC 3.98 3.98 3.99  HGB 11.1* 11.1* 11.0*  HCT 35.6* 35.7* 35.2*  MCV 89.4 89.7 88.2  MCH 27.9 27.9 27.6  MCHC 31.2 31.1 31.3  RDW 14.8 14.7 15.0  PLT 200 196 199    Cardiac EnzymesNo results for input(s): TROPONINI in the last 168 hours. No results for input(s): TROPIPOC in the last 168 hours.   BNPNo results for input(s): BNP, PROBNP in the last 168 hours.   DDimer No results for input(s): DDIMER in the last 168 hours.   Radiology    Mr Brain Wo Contrast  Result Date: 02/25/2019 CLINICAL DATA:  Altered mental status since recent surgical procedure. EXAM: MRI HEAD WITHOUT CONTRAST TECHNIQUE: Multiplanar, multiecho pulse sequences of the brain and surrounding structures were obtained without intravenous contrast. COMPARISON:  Head CT 02/23/2019 FINDINGS: BRAIN: There is no acute infarct, acute hemorrhage, hydrocephalus or extra-axial collection. The midline structures are normal. No midline shift or other mass effect. Early  confluent hyperintense T2-weighted signal of the periventricular and deep white matter, most commonly due to chronic ischemic microangiopathy. There is an old infarct of the left paramedian pons. The cerebral and cerebellar volume are age-appropriate. Hemosiderin deposition at the site of old pontine infarct. VASCULAR: Major intracranial arterial and venous sinus flow voids are normal. SKULL AND UPPER CERVICAL SPINE: Calvarial bone marrow signal is normal. There is no skull base mass. Visualized upper cervical spine and soft tissues are normal. SINUSES/ORBITS: No fluid levels or advanced mucosal thickening. No mastoid or middle ear effusion. The orbits are normal. IMPRESSION: 1. Atrophy and chronic ischemic microangiopathy without acute intracranial abnormality. 2. 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 Electronically Signed   By: Ulyses Jarred M.D.   On: 02/25/2019 14:20    Cardiac Studies   2D Echocardiogram 2.22.2020  1. The left ventricle has hyperdynamic systolic function, with an ejection fraction of >65%. The cavity size was normal. There is mildly increased left ventricular wall thickness. Left ventricular diastology could not be evaluated secondary to atrial  fibrillation. 2. The right ventricle has normal systolic function. The cavity was normal. There is no increase in right ventricular wall thickness. 3. The mitral valve is normal in structure. 4. The tricuspid valve is normal in structure. 5. The aortic valve is normal in structure. Mild thickening of the aortic valve no stenosis of the aortic valve. 6. The interatrial septum was not assessed.  Patient Profile     73 y.o. female with a hx of permanent Afib on Eliquis s/p ablation in 2013 with recurrent Afib s/p repeat ablation in 01/2015 s/p DCCV in 02/2015 s/p DCCV in 08/2016 with recurrent Afib 1 month later leading to the discontinuation of amiodarone and rate control strategy since, aortic atherosclerosis, chronic diastolic CHF,  GI bleed on Xarelto, COPD secondary to tobacco abuse quitting in 2012, and anemia, who was admitted 2/21 with acute appendicitis and rapid afib. Now s/p open appendectomy.  Assessment & Plan    1.  Atrial fibrillation with rapid ventricular response:  Currently not taking oral pills -- keep on esmolol infusion for rate control Room to titrate upwards for rate control and blood pressure control on heparin infusion, unable to restart  eliquis  2.  Acute appendicitis status post appendectomy on February 21. Followed by surgery  3Encephalopathy Etiology unclear, CT and MRI are unrevealing Consider EEG and neurology consult  4.  Chronic diastolic heart failure:  Given elevated rate, atrial fibrillation, Several days of fluids Would give dose of IV Lasix   Total encounter time more than 25 minutes  Greater than 50% was spent in counseling and coordination of care with the patient   For questions or updates, please contact [place] Please consult [email or web address] for contact info under        Signed, Ida Rogue, MD  02/26/2019, 7:12 PM

## 2019-02-26 NOTE — Progress Notes (Signed)
[facility] for Heparin  Indication: atrial fibrillation  Allergies  Allergen Reactions  . Latex     Itching Itching    Patient Measurements: Height: 5\' 6"  (167.6 cm) Weight: 220 lb 14.4 oz (100.2 kg) IBW/kg (Calculated) : 59.3 Heparin Dosing Weight: 82 kg   Vital Signs: Temp: 97.9 F (36.6 C) (02/28 0748) Temp Source: Axillary (02/28 0748) BP: 132/90 (02/28 0748) Pulse Rate: 91 (02/28 0748)  Labs: Recent Labs    02/24/19 0526 02/24/19 1323 02/24/19 2041 02/25/19 0342 02/25/19 1157 02/26/19 0653  HGB 11.1*  --   --  11.1*  --  11.0*  HCT 35.6*  --   --  35.7*  --  35.2*  PLT 200  --   --  196  --  199  APTT 103* 74* 71* 71*  --   --   HEPARINUNFRC 0.96*  --   --  0.58 0.52 0.52  CREATININE 0.67  --   --  0.66  --  0.60    Estimated Creatinine Clearance: 78.2 mL/min (by C-G formula based on SCr of 0.6 mg/dL).   Medical History: Past Medical History:  Diagnosis Date  . Barretts esophagus   . Chest pain    a. 02/2014 Myoview: Ef 50%, no ischemia.  . Colon polyps   . COPD (chronic obstructive pulmonary disease) ([place])   . Depression with anxiety   . GERD (gastroesophageal reflux disease)   . Neuropathy   . Permanent atrial fibrillation    a. s/p ablation 01/2012 in [facility] by Dr. Boyd Kerbs;  b. On sotalol & Xarelto;  c. 02/2014 Echo: EF 50-55%, mild conc LVH, nl LA size/structure;  d. Recurrent afib 8/15 & 08/29/2014.  [name] Rectal fistula   . Rheumatoid arthritis ([place])    On methotrexate and orencia  . Urinary incontinence   . Vitamin D deficiency     Medications:  Medications Prior to Admission  Medication Sig Dispense Refill Last Dose  . ELIQUIS 5 MG TABS tablet Take 1 tablet (5 mg total) by mouth 2 (two) times daily. 180 tablet 3 02/18/2019 at 0900  . esomeprazole (NEXIUM) 20 MG capsule Take 20 mg by mouth daily at 12 noon.   02/18/2019 at 1200  . Fluticasone-Umeclidin-Vilant (TRELEGY ELLIPTA) 100-62.5-25 MCG/INH AEPB  Inhale 1 puff into the lungs daily. 60 each 2 02/19/2019 at 0530  . gabapentin (NEURONTIN) 300 MG capsule Take 1 capsule (300 mg total) by mouth 3 (three) times daily. 90 capsule 3 02/18/2019 at 2000  . HUMIRA PEN 40 MG/0.4ML PNKT Inject 40 mg as directed. Every 2 weeks   Past Month at Unknown time  . hydroxychloroquine (PLAQUENIL) 200 MG tablet Take 200 mg by mouth 2 (two) times daily.    02/18/2019 at 2000  . propranolol ER (INDERAL LA) 80 MG 24 hr capsule Take 1 capsule (80 mg total) by mouth daily. 90 capsule 3 02/18/2019 at 0800  . rosuvastatin (CRESTOR) 10 MG tablet Take 1 tablet (10 mg total) by mouth daily. 90 tablet 3 02/18/2019 at 2000  . albuterol (PROVENTIL HFA;VENTOLIN HFA) 108 (90 Base) MCG/ACT inhaler Inhale 1-2 puffs into the lungs every 4 (four) hours as needed for wheezing or shortness of breath. 1 Inhaler 10 Unknown at prn  . furosemide (LASIX) 20 MG tablet Take 1 tablet (20 mg total) by mouth daily as needed. 90 tablet 2 Unknown at prn  . ibuprofen (ADVIL,MOTRIN) 400 MG tablet Take 400 mg by mouth every 4 (four) hours  as needed.    Unknown at prn  . lidocaine (XYLOCAINE) 5 % ointment Apply 1 application topically as needed.   Unknown at prn  . potassium chloride (K-DUR) 10 MEQ tablet Take 1 tablet (10 mEq total) by mouth daily as needed. 90 tablet 2 Unknown at prn  . traMADol (ULTRAM) 50 MG tablet Take 1 tablet (50 mg total) by mouth every 12 (twelve) hours as needed for severe pain. 30 tablet 0 Unknown at prn    Assessment: Pharmacy consulted for heparin drip management for 73 year old female with permanent atrial fibrillation and chronic diastolic heart failure followed by Dr. Rockey Situ who presented to the emergency room due to abdominal pain and found to have acute appendicitis and atrial fibrillation with RVR. Patient previously on apixaban PTA with last dose 02/20 in the evening.   Goal of Therapy: Heparin level 0.3-0.7 units/ml aPTT 66-102s Monitor platelets by  anticoagulation protocol: Yes  Plan:  2/28 HL continues in therapeutic range. Will continue current rate and will recheck heparin level with am labs.   Pharmacy will continue to monitor and adjust per consult.   Paulina Fusi, PharmD, BCPS 02/26/2019 11:24 AM

## 2019-02-26 NOTE — Progress Notes (Signed)
Pharmacy Electrolyte Monitoring Consult:  Pharmacy consulted to assist in monitoring and replacing electrolytes in this 73 y.o. female admitted on 02/19/2019 with Abdominal Pain   Labs:  Sodium (mmol/L)  Date Value  02/26/2019 134 (L)  01/02/2016 141  01/04/2015 139   Potassium (mmol/L)  Date Value  02/26/2019 3.8  01/04/2015 3.8   Magnesium (mg/dL)  Date Value  02/26/2019 2.0  01/03/2015 1.9   Phosphorus (mg/dL)  Date Value  02/26/2019 3.8   Calcium (mg/dL)  Date Value  02/26/2019 8.4 (L)   Calcium, Total (mg/dL)  Date Value  01/04/2015 8.5   Albumin (g/dL)  Date Value  02/22/2019 2.7 (L)  01/02/2016 3.9  08/29/2014 2.6 (L)    Assessment/Plan: 2/28 Patient received a total of  potassium 4mEq IV on 2/27. Patient unable to receive oral therapy at this time. Will order potassium 30mEq IV Q1hr x 2 doses.   During critical illness, will replace for goal potassium ~ 4 and goal magnesium ~ 2.   Will continue to monitor and adjust per consult.   Paulina Fusi, PharmD, BCPS 02/26/2019 9:58 AM

## 2019-02-26 NOTE — Progress Notes (Signed)
Initial Nutrition Assessment  DOCUMENTATION CODES:   Obesity unspecified  INTERVENTION:   If tube feeds initiated, recommend:  Osmolite 1.5 @ goal rate of 18ml/hr- Initiate at 70ml/hr and increase by 24ml/hr q 8 hours as tolerated until goal rate is reached  Prostat liquid protein 30 ml BID via tube, each supplement provides 100 kcal, 15 grams protein.  Free water 43ml q 4 hours for tube patency   Regimen provides 2000kcal/day, 105g/day, 1089ml/day of free water   Pt at high refeed risk; recommend monitor K, Mg and P labs daily until stable  NUTRITION DIAGNOSIS:   Inadequate oral intake related to inability to eat as evidenced by NPO status  GOAL:   Patient will meet greater than or equal to 90% of their needs  MONITOR:   Diet advancement, Labs, Weight trends, Skin, I & O's  REASON FOR ASSESSMENT:   NPO/Clear Liquid Diet    ASSESSMENT:   73 y.o. female with acute perforated appendicitis s/p laparoscopic converted to open appendectomy 0/63, complicated by pertinent comorbidities including atrial fibrillation on anticoagulation, COPD, rheumatoid arthritis.   Pt with AMS and on NPO/clear liquid diet since admit. Pt now without adequate nutrition for 7 days. Pt unable to tolerate NGT placement; plan is to retry today with sedation. Could also consider IR placement of NGT. No new weight since 2/24; recommend obtain new weight. Per chart, pt appears weight stable pta.Tube feed recommendations above if NGT able to be placed. Pt at high refeed risk; recommend monitor K, Mg and P labs daily until stable. No BM noted since admit.   Medications reviewed and include: precedex, pepcid, heparin, zosyn  Labs reviewed: Na 134(L), K 3.8 wnl, P 3.8 wnl, Mg 2.0 wnl  NUTRITION - FOCUSED PHYSICAL EXAM:    Most Recent Value  Orbital Region  No depletion  Upper Arm Region  No depletion  Thoracic and Lumbar Region  No depletion  Buccal Region  No depletion  Temple Region  No depletion   Clavicle Bone Region  No depletion  Clavicle and Acromion Bone Region  No depletion  Scapular Bone Region  No depletion  Dorsal Hand  No depletion  Patellar Region  No depletion  Anterior Thigh Region  No depletion  Posterior Calf Region  No depletion  Edema (RD Assessment)  Mild  Hair  Reviewed  Eyes  Reviewed  Mouth  Reviewed  Skin  Reviewed  Nails  Reviewed     Diet Order:   Diet Order            Diet NPO time specified  Diet effective now             EDUCATION NEEDS:   No education needs have been identified at this time  Skin:  Skin Assessment: Reviewed RN Assessment(incision abdomen )  Last BM:  pta  Height:   Ht Readings from Last 1 Encounters:  02/22/19 5\' 6"  (1.676 m)    Weight:   Wt Readings from Last 1 Encounters:  02/22/19 100.2 kg    Ideal Body Weight:  59 kg  BMI:  Body mass index is 35.65 kg/m.  Estimated Nutritional Needs:   Kcal:  1800-2100kcal/day   Protein:  100-110g/day   Fluid:  1.5L/day   Koleen Distance MS, RD, LDN Pager #- [phone] Office#- [phone] After Hours Pager: [phone]

## 2019-02-27 DIAGNOSIS — K3532 Acute appendicitis with perforation and localized peritonitis, without abscess: Secondary | ICD-10-CM

## 2019-02-27 LAB — GLUCOSE, CAPILLARY
Glucose-Capillary: 68 mg/dL — ABNORMAL LOW (ref 70–99)
Glucose-Capillary: 77 mg/dL (ref 70–99)
Glucose-Capillary: 86 mg/dL (ref 70–99)
Glucose-Capillary: 73 mg/dL (ref 70–99)
Glucose-Capillary: 103 mg/dL — ABNORMAL HIGH (ref 70–99)
Glucose-Capillary: 92 mg/dL (ref 70–99)
Glucose-Capillary: 94 mg/dL (ref 70–99)

## 2019-02-27 LAB — BASIC METABOLIC PANEL
Anion gap: 14 (ref 5–15)
Anion gap: 11 (ref 5–15)
BUN: 9 mg/dL (ref 8–23)
BUN: 8 mg/dL (ref 8–23)
CO2: 21 mmol/L — ABNORMAL LOW (ref 22–32)
CO2: 26 mmol/L (ref 22–32)
Calcium: 8.2 mg/dL — ABNORMAL LOW (ref 8.9–10.3)
Calcium: 8.4 mg/dL — ABNORMAL LOW (ref 8.9–10.3)
Chloride: 99 mmol/L (ref 98–111)
Chloride: 100 mmol/L (ref 98–111)
Creatinine, Ser: 0.63 mg/dL (ref 0.44–1.00)
Creatinine, Ser: 0.68 mg/dL (ref 0.44–1.00)
GFR calc Af Amer: >60 mL/min (ref >60)
GFR calc Af Amer: >60 mL/min (ref >60)
GFR calc non Af Amer: >60 mL/min (ref >60)
GLUCOSE: 89 mg/dL (ref 70–99)
Glucose, Bld: 101 mg/dL — ABNORMAL HIGH (ref 70–99)
Potassium: 2.9 mmol/L — ABNORMAL LOW (ref 3.5–5.1)
Potassium: 3 mmol/L — ABNORMAL LOW (ref 3.5–5.1)
SODIUM: 134 mmol/L — AB (ref 135–145)
Sodium: 137 mmol/L (ref 135–145)

## 2019-02-27 LAB — CBC
HEMATOCRIT: 38.4 % (ref 36.0–46.0)
Hemoglobin: 11.9 g/dL — ABNORMAL LOW (ref 12.0–15.0)
MCH: 27.4 pg (ref 26.0–34.0)
MCHC: 31 g/dL (ref 30.0–36.0)
MCV: 88.5 fL (ref 80.0–100.0)
Platelets: 204 10*3/uL (ref 150–400)
RBC: 4.34 MIL/uL (ref 3.87–5.11)
RDW: 15.2 % (ref 11.5–15.5)
WBC: 8.3 10*3/uL (ref 4.0–10.5)
nRBC: 0 % (ref 0.0–0.2)

## 2019-02-27 LAB — HEPARIN LEVEL (UNFRACTIONATED): Heparin Unfractionated: 0.47 [IU]/mL (ref 0.30–0.70)

## 2019-02-27 LAB — MAGNESIUM: MAGNESIUM: 1.9 mg/dL (ref 1.7–2.4)

## 2019-02-27 LAB — PHOSPHORUS: Phosphorus: 3.7 mg/dL (ref 2.5–4.6)

## 2019-02-27 MED ORDER — POTASSIUM CHLORIDE 10 MEQ/100ML IV SOLN
10.0000 meq | INTRAVENOUS | Status: AC
  Administered 2019-02-27 – 2019-02-28 (×3): 10 meq via INTRAVENOUS
  Filled 2019-02-27 (×3): qty 100

## 2019-02-27 MED ORDER — DEXTROSE-NACL 5-0.45 % IV SOLN
INTRAVENOUS | Status: DC
  Administered 2019-02-27: 02:00:00 via INTRAVENOUS

## 2019-02-27 MED ORDER — SODIUM CHLORIDE 0.9 % IV BOLUS
500.0000 mL | INTRAVENOUS | Status: AC
  Administered 2019-02-27 (×2): 500 mL via INTRAVENOUS

## 2019-02-27 MED ORDER — POTASSIUM CL IN DEXTROSE 5% 20 MEQ/L IV SOLN
20.0000 meq | INTRAVENOUS | Status: DC
  Filled 2019-02-27: qty 1000

## 2019-02-27 MED ORDER — POTASSIUM CHLORIDE 10 MEQ/100ML IV SOLN
10.0000 meq | INTRAVENOUS | Status: AC
  Administered 2019-02-27 (×3): 10 meq via INTRAVENOUS
  Filled 2019-02-27 (×3): qty 100

## 2019-02-27 MED ORDER — DEXTROSE 5 % IV SOLN: INTRAVENOUS | Status: DC

## 2019-02-27 MED ORDER — MAGNESIUM SULFATE IN D5W 1-5 GM/100ML-% IV SOLN
1.0000 g | Freq: Once | INTRAVENOUS | Status: AC
  Administered 2019-02-27: 1 g via INTRAVENOUS
  Filled 2019-02-27: qty 100

## 2019-02-27 MED ORDER — POTASSIUM CHLORIDE 2 MEQ/ML IV SOLN
INTRAVENOUS | Status: DC
  Administered 2019-02-27: 13:00:00 via INTRAVENOUS
  Filled 2019-02-27 (×3): qty 1000

## 2019-02-27 NOTE — Progress Notes (Deleted)
Progress Note  Patient Name: Brandy Armstrong Date of Encounter: 02/27/2019  Primary Cardiologist: Ida Rogue, MD   Patient Profile     73 y.o. female with a hx of permanent Afib on Eliquis s/p ablation in 2013 with recurrent Afib s/p repeat ablation in 01/2015 s/p DCCV in 02/2015 s/p DCCV in 08/2016 with recurrent Afib 1 month later leading to the discontinuation of amiodarone and rate control strategy since, aortic atherosclerosis, chronic diastolic CHF, GI bleed on Xarelto, COPD secondary to tobacco abuse quitting in 2012, and anemia, who was admitted 2/21 with acute appendicitis and rapid afib. Now s/p open appendectomy.   Subjective   Verbally engaging.  Denies chest pain or shortness of breath.  Disoriented.  Inpatient Medications    Scheduled Meds: . budesonide (PULMICORT) nebulizer solution  0.25 mg Nebulization Q12H  . chlorhexidine gluconate (MEDLINE KIT)  15 mL Mouth Rinse BID  . furosemide  40 mg Intravenous Daily  . ipratropium  0.5 mg Nebulization BID  . levalbuterol  0.63 mg Nebulization BID  . metoprolol tartrate  10 mg Intravenous Q6H  . midazolam  2 mg Intravenous Once   Continuous Infusions: . dexmedetomidine (PRECEDEX) IV infusion Stopped (02/27/19 0048)  . dextrose 5 % with kcl 50 mL/hr at 02/27/19 1241  . esmolol 75 mcg/kg/min (02/27/19 0700)  . famotidine (PEPCID) IV 20 mg (02/27/19 1401)  . heparin 1,050 Units/hr (02/27/19 0700)   PRN Meds: acetaminophen **OR** acetaminophen, fentaNYL (SUBLIMAZE) injection, HYDROcodone-acetaminophen, ondansetron **OR** ondansetron (ZOFRAN) IV   Vital Signs    Vitals:   02/27/19 1100 02/27/19 1200 02/27/19 1300 02/27/19 1400  BP: (!) 118/91 104/83 127/82 121/75  Pulse: (!) 109 (!) 116 (!) 116 (!) 110  Resp: 18 (!) 24 (!) 23 18  Temp:    98.2 F (36.8 C)  TempSrc:    Axillary  SpO2: 96% 97% 97% 97%  Weight:      Height:        Intake/Output Summary (Last 24 hours) at 02/27/2019 1619 Last data filed at  02/27/2019 1517 Gross per 24 hour  Intake 2444.87 ml  Output 5350 ml  Net -2905.13 ml   Last 3 Weights 02/22/2019 02/10/2019 02/05/2019  Weight (lbs) 220 lb 14.4 oz 221 lb 3.2 oz 220 lb 4 oz  Weight (kg) 100.2 kg 100.336 kg 99.905 kg      Telemetry    Telemetry Personally reviewed  Atrial fibrillation rates 110-120  ECG       Physical Exam   Well developed and nourished in no acute distress HENT normal Neck supple   Course breath sounds Irregularly irregular rate and rhythm Abd-soft with active BS No Clubbing cyanosis edema Skin-warm and dry A & not oriented  grossly normal sensory and motor function   Labs    Chemistry Recent Labs  Lab 02/21/19 0443 02/22/19 0641  02/25/19 0342 02/26/19 0653 02/27/19 0731  NA 138 138   < > 136 134* 134*  K 4.3 4.4   < > 3.4* 3.8 2.9*  CL 109 106   < > 109 106 99  CO2 21* 24   < > 22 18* 21*  GLUCOSE 116* 105*   < > 102* 69* 89  BUN 16 15   < > 7* 7* 9  CREATININE 0.77 0.69   < > 0.66 0.60 0.63  CALCIUM 8.5* 8.8*   < > 8.3* 8.4* 8.2*  PROT 6.1* 6.3*  --   --   --   --  ALBUMIN 2.8* 2.7*  --   --   --   --   AST 19 16  --   --   --   --   ALT 10 9  --   --   --   --   ALKPHOS 50 53  --   --   --   --   BILITOT 0.8 0.7  --   --   --   --   GFRNONAA >60 >60   < > >60 >60 >60  GFRAA >60 >60   < > >60 >60 >60  ANIONGAP 8 8   < > '5 10 14   '$ < > = values in this interval not displayed.     Hematology Recent Labs  Lab 02/25/19 0342 02/26/19 0653 02/27/19 0731  WBC 6.7 7.5 8.3  RBC 3.98 3.99 4.34  HGB 11.1* 11.0* 11.9*  HCT 35.7* 35.2* 38.4  MCV 89.7 88.2 88.5  MCH 27.9 27.6 27.4  MCHC 31.1 31.3 31.0  RDW 14.7 15.0 15.2  PLT 196 199 204    Cardiac EnzymesNo results for input(s): TROPONINI in the last 168 hours. No results for input(s): TROPIPOC in the last 168 hours.   BNPNo results for input(s): BNP, PROBNP in the last 168 hours.   DDimer No results for input(s): DDIMER in the last 168 hours.   Radiology    No  results found.  Cardiac Studies   2D Echocardiogram 2.22.2020  1. The left ventricle has hyperdynamic systolic function, with an ejection fraction of >65%. The cavity size was normal. There is mildly increased left ventricular wall thickness. Left ventricular diastology could not be evaluated secondary to atrial  fibrillation. 2. The right ventricle has normal systolic function. The cavity was normal. There is no increase in right ventricular wall thickness. 3. The mitral valve is normal in structure. 4. The tricuspid valve is normal in structure. 5. The aortic valve is normal in structure. Mild thickening of the aortic valve no stenosis of the aortic valve. 6. The interatrial septum was not assessed.   Assessment & Plan     Atrial fibrillation with rapid ventricular response:    Hypokalemia    Heart failure-diastolic-acute/chronic  Encephalopathy    Acute appendicitis status post appendectomy on February 21. Followed by surgery   K Mildly repleted  this am,    Euvolemic continue current meds  We will work on transitioning to oral medications in the morning  For questions or updates, please contact [facility] Please consult [email or web address] for contact info under        Signed, Virl Axe, MD  02/27/2019, 4:19 PM

## 2019-02-27 NOTE — Progress Notes (Deleted)
See separate note.

## 2019-02-27 NOTE — Progress Notes (Signed)
PCCM PROGRESS NOTE   PT PROFILE: 73 y.o. female pt known to me from office (chronic asthmatic bronchitis) presented to [facility] ED with abdominal pain.  CT of abdomen revealed acute appendicitis.  Underwent laparoscopic appendectomy 2/21.  Remained intubated postprocedure.  SUBJ: Cognition improved.  No respiratory distress.  Remains in atrial fibrillation (chronic) with rapid rate (108-126/min)   Vitals:   02/27/19 0300 02/27/19 0400 02/27/19 0500 02/27/19 0600  BP: 122/85 110/70 (!) 109/58 (!) 76/64  Pulse: (!) 101 98 98 (!) 121  Resp: (!) 22 18 (!) 30 (!) 22  Temp:      TempSrc:      SpO2: 98% 95% 97% 97%  Weight:      Height:         REVIEW OF SYSTEMS  PATIENT IS UNABLE TO PROVIDE COMPLETE REVIEW OF SYSTEM S DUE TO SENCEPHALOPATHY   Physical Examination:   GENERAL:NAD HEAD: Normocephalic, atraumatic.  EYES: PERLA, EOMI No scleral icterus.  MOUTH: Moist mucosal membrane.  EAR, NOSE, THROAT: Clear without exudates. No external lesions.  NECK: Supple. No thyromegaly.  No JVD.  PULMONARY: CTA B/L no wheezing, rhonchi, crackles CARDIOVASCULAR: S1 and S2. Regular rate and rhythm. No murmurs GASTROINTESTINAL: Soft, nontender, nondistended. Positive bowel sounds.  MUSCULOSKELETAL: No swelling, clubbing, or edema.  NEUROLOGIC: No gross focal neurological deficits. 5/5 strength all extremities SKIN: No ulceration, lesions, rashes, or cyanosis.  PSYCHIATRIC: Insight, poor, lethargic but arousable, confused ALL OTHER ROS ARE NEGATIVE      DATA:   BMP Latest Ref Rng & Units 02/26/2019 02/25/2019 02/24/2019  Glucose 70 - 99 mg/dL 69(L) 102(H) 105(H)  BUN 8 - 23 mg/dL 7(L) 7(L) 9  Creatinine 0.44 - 1.00 mg/dL 0.60 0.66 0.67  BUN/Creat Ratio 11 - 26 - - -  Sodium 135 - 145 mmol/L 134(L) 136 136  Potassium 3.5 - 5.1 mmol/L 3.8 3.4(L) 3.4(L)  Chloride 98 - 111 mmol/L 106 109 105  CO2 22 - 32 mmol/L 18(L) 22 21(L)  Calcium 8.9 - 10.3 mg/dL 8.4(L) 8.3(L) 8.2(L)    CBC Latest  Ref Rng & Units 02/26/2019 02/25/2019 02/24/2019  WBC 4.0 - 10.5 K/uL 7.5 6.7 7.1  Hemoglobin 12.0 - 15.0 g/dL 11.0(L) 11.1(L) 11.1(L)  Hematocrit 36.0 - 46.0 % 35.2(L) 35.7(L) 35.6(L)  Platelets 150 - 400 K/uL 199 196 200    CXR 2/23: Left basilar atelectasis  I have personally reviewed all chest radiographs reported above including CXRs and CT chest unless otherwise indicated  IMPRESSION:    73 yo acute ruptured appendix post op resp failure resolved complicated by acute AFIB WITH RVR difficult to control on beta blocker therapy   NEURO WORK UP NEGATIVE UP TO THIS POINT ENCEPHALOPATHY FROM ICU DELERIUM, PAIN, RESOLVING SEPSIS WITH AFIB-FAMILU UPDATED  ADJUST SLEEP WAKE CYCLE PATIENT NEEDS NG or DUB HOFF tube for nutrition-IR to assist with placement  AFIB WITH RVR ESMOLOL INFUSION  REMAINS SD STATUS    Corrin Parker, M.D.  [facility]  Medical Director [place] Director [facility] Cardio-Pulmonary Department

## 2019-02-27 NOTE — Progress Notes (Deleted)
Pharmacy Electrolyte Monitoring Consult:  Pharmacy consulted to assist in monitoring and replacing electrolytes in this 73 y.o. female admitted on 02/19/2019 with Abdominal Pain   Labs:  Sodium (mmol/L)  Date Value  02/26/2019 134 (L)  01/02/2016 141  01/04/2015 139   Potassium (mmol/L)  Date Value  02/26/2019 3.8  01/04/2015 3.8   Magnesium (mg/dL)  Date Value  02/26/2019 2.0  01/03/2015 1.9   Phosphorus (mg/dL)  Date Value  02/26/2019 3.8   Calcium (mg/dL)  Date Value  02/26/2019 8.4 (L)   Calcium, Total (mg/dL)  Date Value  01/04/2015 8.5   Albumin (g/dL)  Date Value  02/22/2019 2.7 (L)  01/02/2016 3.9  08/29/2014 2.6 (L)    Assessment/Plan: Patient ordered D5 at 21mL/hr do to hypoglycemia. Will add 29mEq of potassium/liter and recheck BMP with am labs.   During critical illness, will replace for goal potassium ~ 4 and goal magnesium ~ 2.   Will continue to monitor and adjust per consult.   MLS 02/27/2019 7:19 AM

## 2019-02-27 NOTE — Progress Notes (Signed)
[place] [facility] [place] NAME: Brandy Armstrong    MR#:  [id number]  DATE OF BIRTH:  October 30, 1946  SUBJECTIVE:  CHIEF COMPLAINT:   Chief Complaint  Patient presents with  . Abdominal Pain   Patient status post recent surgery for perforated appendicitis done on 01/19/2019. Patient extubated on 02/20/2019, wants to eat. More alert today, tachycardic REVIEW OF SYSTEMS:  Review of Systems  Constitutional: Negative for chills and fever.  HENT: Negative for hearing loss and tinnitus.   Eyes: Negative for blurred vision and double vision.  Respiratory: Negative for cough, sputum production and shortness of breath.   Cardiovascular: Negative for chest pain and palpitations.  Gastrointestinal: Negative for abdominal pain, heartburn, nausea and vomiting.  Genitourinary: Negative for dysuria and urgency.  Musculoskeletal: Negative for myalgias and neck pain.  Skin: Negative for itching and rash.  Neurological: Negative for dizziness and headaches.  Psychiatric/Behavioral: Negative for depression and hallucinations.      DRUG ALLERGIES:   Allergies  Allergen Reactions  . Latex     Itching Itching   VITALS:  Blood pressure 121/75, pulse (!) 110, temperature 98.2 F (36.8 C), temperature source Axillary, resp. rate 18, height 5\' 6"  (1.676 m), weight 100.2 kg, SpO2 97 %. PHYSICAL EXAMINATION:  Physical Exam  Constitutional: She appears well-developed and well-nourished.  HENT:  Head: Normocephalic and atraumatic.  Eyes: Pupils are equal, round, and reactive to light. Conjunctivae and EOM are normal.  Neck: Normal range of motion. Neck supple. No tracheal deviation present. No thyromegaly present.  Cardiovascular: Regular rhythm. Tachycardia present.  Respiratory: Effort normal and breath sounds normal.  GI: Soft. Bowel sounds are normal. There is no abdominal tenderness.  Musculoskeletal: Normal range of motion.        General: No edema.   Neurological: She is alert.  Patient   Skin: Skin is warm. No erythema.  Psychiatric: She has a normal mood and affect. Her behavior is normal.   LABORATORY PANEL:  Female CBC Recent Labs  Lab 02/27/19 0731  WBC 8.3  HGB 11.9*  HCT 38.4  PLT 204   ------------------------------------------------------------------------------------------------------------------ Chemistries  Recent Labs  Lab 02/22/19 0641  02/27/19 0731  NA 138   < > 134*  K 4.4   < > 2.9*  CL 106   < > 99  CO2 24   < > 21*  GLUCOSE 105*   < > 89  BUN 15   < > 9  CREATININE 0.69   < > 0.63  CALCIUM 8.8*   < > 8.2*  MG 2.2   < > 1.9  AST 16  --   --   ALT 9  --   --   ALKPHOS 53  --   --   BILITOT 0.7  --   --    < > = values in this interval not displayed.   RADIOLOGY:  No results found. ASSESSMENT AND PLAN:  73 year old female with permanent atrial fibrillation and chronic diastolic heart failure followed by Dr. Rockey Situ who presented to the emergency room due to abdominal pain and found to have acute appendicitis and atrial fibrillation with RVR.  1.  Atrial fibrillation with RVR:  Being managed by cardiologist.  Eliquis was placed on hold due to surgery done on 02/19/2019   - can start PO if ok with surgery and cleared by ST as she is more alert today Patient currently on esmolol drip.  Patient status post ablation and  has had cardioversions in the past. Last echocardiogram 2017 showing normal ejection fraction Eliquis currently on hold since patient was not able to take p.o. Patient on heparin drip for now until able to tolerate p.o. intake Being managed by cardiology service  2.  Acute perforated appendicitis:  Patient status post surgery on 02/19/2019.   Being managed by surgical team.   3 COPD without signs of exacerbation  4.  Rheumatoid arthritis: Continue outpatient regimen  5.  Hypokalemia: Replaced  6.  Acute metabolic encephalopathy. Patient reported to be more confused .   Disoriented.  Not following instructions.  MRI of the brain done with atrophy and chronic ischemic changes without acute intracranial abnormalities. Being managed by critical care team.  DVT prophylaxis; patient currently on heparin drip    All the records are reviewed and case discussed with Care Management/Social Worker. Management plans discussed with the patient, family and they are in agreement.  CODE STATUS: Full Code  TOTAL TIME TAKING CARE OF THIS PATIENT: 15 minutes.   More than 50% of the time was spent in counseling/coordination of care: YES  POSSIBLE D/C IN 3-4 DAYS, DEPENDING ON CLINICAL CONDITION.   Max Sane M.D on 02/27/2019 at 4:20 PM  Between 7am to 6pm - Pager - [phone]  After 6pm go to [email or web address] - Proofreader  [facility]  Office  [phone]  CC: Primary care physician; [name], FNP  Note: This dictation was prepared with Dragon dictation along with smaller phrase technology. Any transcriptional errors that result from this process are unintentional.

## 2019-02-27 NOTE — Progress Notes (Signed)
[facility] Day(s): 8.   Post op day(s): 8 Days Post-Op.   Interval History: Patient seen and examined, no acute events or new complaints overnight.  Patient a little bit more responsive today.  When I entered the room she is more focused and at least is answering simple question with her head.  This is a significant change from yesterday which she was basically staring around.  Vital signs in last 24 hours: [min-max] current  Temp:  [98.2 F (36.8 C)-98.8 F (37.1 C)] 98.3 F (36.8 C) (02/29 0200) Pulse Rate:  [90-122] 121 (02/29 0600) Resp:  [17-30] 22 (02/29 0600) BP: (76-148)/(51-104) 76/64 (02/29 0600) SpO2:  [94 %-99 %] 96 % (02/29 0821)     Height: 5\' 6"  (167.6 cm) Weight: 100.2 kg BMI (Calculated): 35.67    Physical Exam:  Constitutional: alert, cooperative and no distress  Respiratory: breathing non-labored at rest  Cardiovascular: regular rate and sinus rhythm  Gastrointestinal: soft, non-tender, and non-distended.  Midline wound dry and clean  Labs:  CBC Latest Ref Rng & Units 02/27/2019 02/26/2019 02/25/2019  WBC 4.0 - 10.5 K/uL 8.3 7.5 6.7  Hemoglobin 12.0 - 15.0 g/dL 11.9(L) 11.0(L) 11.1(L)  Hematocrit 36.0 - 46.0 % 38.4 35.2(L) 35.7(L)  Platelets 150 - 400 K/uL 204 199 196   CMP Latest Ref Rng & Units 02/27/2019 02/26/2019 02/25/2019  Glucose 70 - 99 mg/dL 89 69(L) 102(H)  BUN 8 - 23 mg/dL 9 7(L) 7(L)  Creatinine 0.44 - 1.00 mg/dL 0.63 0.60 0.66  Sodium 135 - 145 mmol/L 134(L) 134(L) 136  Potassium 3.5 - 5.1 mmol/L 2.9(L) 3.8 3.4(L)  Chloride 98 - 111 mmol/L 99 106 109  CO2 22 - 32 mmol/L 21(L) 18(L) 22  Calcium 8.9 - 10.3 mg/dL 8.2(L) 8.4(L) 8.3(L)  Total Protein 6.5 - 8.1 g/dL - - -  Total Bilirubin 0.3 - 1.2 mg/dL - - -  Alkaline Phos 38 - 126 U/L - - -  AST 15 - 41 U/L - - -  ALT 0 - 44 U/L - - -    Imaging studies: No new pertinent imaging studies   Assessment/Plan:  73 y.o.femalewith acute perforated appendicitis7Day  Post-Ops/p laparoscopic converted to open appendectomy, complicated by pertinent comorbidities includingatrial fibrillation on anticoagulation, COPD, rheumatoid arthritis. Patient with significant metabolic encephalopathy.  MRI negative for stroke.  Today with minimal improvement compared to yesterday.  She looks more responsive.  I agree with IR placement of NGT for feedings since patient has been holding her food in her mouth.  I appreciate cardiology and critical care team with the management of A. fib which the rate has been improving also.  From my standpoint when NGT is placed the patient can be started on enteral feedings and advance as tolerated.  Patient currently on therapeutic anticoagulation due to A. fib.  No sign of bleeding.  Arnold Long, MD

## 2019-02-27 NOTE — Progress Notes (Addendum)
Progress Note  Patient Name: Brandy Armstrong Date of Encounter: 02/27/2019  Primary Cardiologist: Ida Rogue, MD   Patient Profile     73 y.o. female with a hx of permanent Afib on Eliquis s/p ablation in 2013 with recurrent Afib s/p repeat ablation in 01/2015 s/p DCCV in 02/2015 s/p DCCV in 08/2016 with recurrent Afib 1 month later leading to the discontinuation of amiodarone and rate control strategy since, aortic atherosclerosis, chronic diastolic CHF, GI bleed on Xarelto, COPD secondary to tobacco abuse quitting in 2012, and anemia, who was admitted 2/21 with acute appendicitis and rapid afib. Now s/p open appendectomy.   Subjective   Verbally engaging.  Denies chest pain or shortness of breath.  Disoriented.  Inpatient Medications    Scheduled Meds: . budesonide (PULMICORT) nebulizer solution  0.25 mg Nebulization Q12H  . chlorhexidine gluconate (MEDLINE KIT)  15 mL Mouth Rinse BID  . furosemide  40 mg Intravenous Daily  . ipratropium  0.5 mg Nebulization BID  . levalbuterol  0.63 mg Nebulization BID  . metoprolol tartrate  10 mg Intravenous Q6H  . midazolam  2 mg Intravenous Once   Continuous Infusions: . dexmedetomidine (PRECEDEX) IV infusion Stopped (02/27/19 0048)  . dextrose 5 % with kcl 50 mL/hr at 02/27/19 1241  . esmolol 75 mcg/kg/min (02/27/19 0700)  . famotidine (PEPCID) IV 20 mg (02/27/19 1401)  . heparin 1,050 Units/hr (02/27/19 0700)   PRN Meds: acetaminophen **OR** acetaminophen, fentaNYL (SUBLIMAZE) injection, HYDROcodone-acetaminophen, ondansetron **OR** ondansetron (ZOFRAN) IV   Vital Signs    Vitals:   02/27/19 1100 02/27/19 1200 02/27/19 1300 02/27/19 1400  BP: (!) 118/91 104/83 127/82 121/75  Pulse: (!) 109 (!) 116 (!) 116 (!) 110  Resp: 18 (!) 24 (!) 23 18  Temp:    98.2 F (36.8 C)  TempSrc:    Axillary  SpO2: 96% 97% 97% 97%  Weight:      Height:        Intake/Output Summary (Last 24 hours) at 02/27/2019 1619 Last data filed at  02/27/2019 1517 Gross per 24 hour  Intake 2444.87 ml  Output 5350 ml  Net -2905.13 ml   Last 3 Weights 02/22/2019 02/10/2019 02/05/2019  Weight (lbs) 220 lb 14.4 oz 221 lb 3.2 oz 220 lb 4 oz  Weight (kg) 100.2 kg 100.336 kg 99.905 kg      Telemetry    Telemetry Personally reviewed  Atrial fibrillation rates 110-120  ECG       Physical Exam   Well developed and nourished in no acute distress HENT normal Neck supple   Course breath sounds Irregularly irregular rate and rhythm Abd-soft with active BS No Clubbing cyanosis edema Skin-warm and dry A & not oriented  grossly normal sensory and motor function   Labs    Chemistry Recent Labs  Lab 02/21/19 0443 02/22/19 0641  02/25/19 0342 02/26/19 0653 02/27/19 0731  NA 138 138   < > 136 134* 134*  K 4.3 4.4   < > 3.4* 3.8 2.9*  CL 109 106   < > 109 106 99  CO2 21* 24   < > 22 18* 21*  GLUCOSE 116* 105*   < > 102* 69* 89  BUN 16 15   < > 7* 7* 9  CREATININE 0.77 0.69   < > 0.66 0.60 0.63  CALCIUM 8.5* 8.8*   < > 8.3* 8.4* 8.2*  PROT 6.1* 6.3*  --   --   --   --  ALBUMIN 2.8* 2.7*  --   --   --   --   AST 19 16  --   --   --   --   ALT 10 9  --   --   --   --   ALKPHOS 50 53  --   --   --   --   BILITOT 0.8 0.7  --   --   --   --   GFRNONAA >60 >60   < > >60 >60 >60  GFRAA >60 >60   < > >60 >60 >60  ANIONGAP 8 8   < > '5 10 14   '$ < > = values in this interval not displayed.     Hematology Recent Labs  Lab 02/25/19 0342 02/26/19 0653 02/27/19 0731  WBC 6.7 7.5 8.3  RBC 3.98 3.99 4.34  HGB 11.1* 11.0* 11.9*  HCT 35.7* 35.2* 38.4  MCV 89.7 88.2 88.5  MCH 27.9 27.6 27.4  MCHC 31.1 31.3 31.0  RDW 14.7 15.0 15.2  PLT 196 199 204    Cardiac EnzymesNo results for input(s): TROPONINI in the last 168 hours. No results for input(s): TROPIPOC in the last 168 hours.   BNPNo results for input(s): BNP, PROBNP in the last 168 hours.   DDimer No results for input(s): DDIMER in the last 168 hours.   Radiology    No  results found.  Cardiac Studies   2D Echocardiogram 2.22.2020  1. The left ventricle has hyperdynamic systolic function, with an ejection fraction of >65%. The cavity size was normal. There is mildly increased left ventricular wall thickness. Left ventricular diastology could not be evaluated secondary to atrial  fibrillation. 2. The right ventricle has normal systolic function. The cavity was normal. There is no increase in right ventricular wall thickness. 3. The mitral valve is normal in structure. 4. The tricuspid valve is normal in structure. 5. The aortic valve is normal in structure. Mild thickening of the aortic valve no stenosis of the aortic valve. 6. The interatrial septum was not assessed.   Assessment & Plan     Atrial fibrillation with rapid ventricular response:    Hypokalemia    Heart failure-diastolic-acute/chronic  Encephalopathy    Acute appendicitis status post appendectomy on February 21. Followed by surgery   K Mildly repleted  this am,    Euvolemic continue current meds  We will work on transitioning to oral medications in the morning  For questions or updates, please contact [facility] Please consult [email or web address] for contact info under        Signed, Virl Axe, MD  02/27/2019, 4:19 PM

## 2019-02-27 NOTE — Progress Notes (Signed)
Pharmacy Electrolyte Monitoring Consult:  Pharmacy consulted to assist in monitoring and replacing electrolytes in this 73 y.o. female admitted on 02/19/2019 with Abdominal Pain   Labs:  Sodium (mmol/L)  Date Value  02/27/2019 137  01/02/2016 141  01/04/2015 139   Potassium (mmol/L)  Date Value  02/27/2019 3.0 (L)  01/04/2015 3.8   Magnesium (mg/dL)  Date Value  02/27/2019 1.9  01/03/2015 1.9   Phosphorus (mg/dL)  Date Value  02/27/2019 3.7   Calcium (mg/dL)  Date Value  02/27/2019 8.4 (L)   Calcium, Total (mg/dL)  Date Value  01/04/2015 8.5   Albumin (g/dL)  Date Value  02/22/2019 2.7 (L)  01/02/2016 3.9  08/29/2014 2.6 (L)    Assessment/Plan: Continue D5/ 62mEq of potassium at 76mL/hr. Will potasisum 74mEq IV Q1hr x 3 doses.    Will recheck all electrolytes with am labs.   During critical illness, will replace for goal potassium ~ 4 and goal magnesium ~ 2.   Will continue to monitor and adjust per consult.   MLS 02/27/2019 9:12 PM

## 2019-02-27 NOTE — Progress Notes (Signed)
[facility] for Heparin  Indication: atrial fibrillation  Allergies  Allergen Reactions  . Latex     Itching Itching    Patient Measurements: Height: 5\' 6"  (167.6 cm) Weight: 220 lb 14.4 oz (100.2 kg) IBW/kg (Calculated) : 59.3 Heparin Dosing Weight: 82 kg   Vital Signs: Temp: 98.3 F (36.8 C) (02/29 0200) Temp Source: Axillary (02/29 0200) BP: 76/64 (02/29 0600) Pulse Rate: 121 (02/29 0600)  Labs: Recent Labs    02/24/19 1323 02/24/19 2041  02/25/19 0342 02/25/19 1157 02/26/19 0653 02/27/19 0731  HGB  --   --    < > 11.1*  --  11.0* 11.9*  HCT  --   --   --  35.7*  --  35.2* 38.4  PLT  --   --   --  196  --  199 204  APTT 74* 71*  --  71*  --   --   --   HEPARINUNFRC  --   --    < > 0.58 0.52 0.52 0.47  CREATININE  --   --   --  0.66  --  0.60 0.63   < > = values in this interval not displayed.    Estimated Creatinine Clearance: 78.2 mL/min (by C-G formula based on SCr of 0.63 mg/dL).   Medical History: Past Medical History:  Diagnosis Date  . Barretts esophagus   . Chest pain    a. 02/2014 Myoview: Ef 50%, no ischemia.  . Colon polyps   . COPD (chronic obstructive pulmonary disease) ([place])   . Depression with anxiety   . GERD (gastroesophageal reflux disease)   . Neuropathy   . Permanent atrial fibrillation    a. s/p ablation 01/2012 in [facility] by Dr. Boyd Kerbs;  b. On sotalol & Xarelto;  c. 02/2014 Echo: EF 50-55%, mild conc LVH, nl LA size/structure;  d. Recurrent afib 8/15 & 08/29/2014.  [name] Rectal fistula   . Rheumatoid arthritis ([place])    On methotrexate and orencia  . Urinary incontinence   . Vitamin D deficiency     Medications:  Medications Prior to Admission  Medication Sig Dispense Refill Last Dose  . ELIQUIS 5 MG TABS tablet Take 1 tablet (5 mg total) by mouth 2 (two) times daily. 180 tablet 3 02/18/2019 at 0900  . esomeprazole (NEXIUM) 20 MG capsule Take 20 mg by mouth daily at 12 noon.   02/18/2019 at 1200   . Fluticasone-Umeclidin-Vilant (TRELEGY ELLIPTA) 100-62.5-25 MCG/INH AEPB Inhale 1 puff into the lungs daily. 60 each 2 02/19/2019 at 0530  . gabapentin (NEURONTIN) 300 MG capsule Take 1 capsule (300 mg total) by mouth 3 (three) times daily. 90 capsule 3 02/18/2019 at 2000  . HUMIRA PEN 40 MG/0.4ML PNKT Inject 40 mg as directed. Every 2 weeks   Past Month at Unknown time  . hydroxychloroquine (PLAQUENIL) 200 MG tablet Take 200 mg by mouth 2 (two) times daily.    02/18/2019 at 2000  . propranolol ER (INDERAL LA) 80 MG 24 hr capsule Take 1 capsule (80 mg total) by mouth daily. 90 capsule 3 02/18/2019 at 0800  . rosuvastatin (CRESTOR) 10 MG tablet Take 1 tablet (10 mg total) by mouth daily. 90 tablet 3 02/18/2019 at 2000  . albuterol (PROVENTIL HFA;VENTOLIN HFA) 108 (90 Base) MCG/ACT inhaler Inhale 1-2 puffs into the lungs every 4 (four) hours as needed for wheezing or shortness of breath. 1 Inhaler 10 Unknown at prn  . furosemide (LASIX) 20 MG  tablet Take 1 tablet (20 mg total) by mouth daily as needed. 90 tablet 2 Unknown at prn  . ibuprofen (ADVIL,MOTRIN) 400 MG tablet Take 400 mg by mouth every 4 (four) hours as needed.    Unknown at prn  . lidocaine (XYLOCAINE) 5 % ointment Apply 1 application topically as needed.   Unknown at prn  . potassium chloride (K-DUR) 10 MEQ tablet Take 1 tablet (10 mEq total) by mouth daily as needed. 90 tablet 2 Unknown at prn  . traMADol (ULTRAM) 50 MG tablet Take 1 tablet (50 mg total) by mouth every 12 (twelve) hours as needed for severe pain. 30 tablet 0 Unknown at prn    Assessment: Pharmacy consulted for heparin drip management for 73 year old female with permanent atrial fibrillation and chronic diastolic heart failure followed by Dr. Rockey Situ who presented to the emergency room due to abdominal pain and found to have acute appendicitis and atrial fibrillation with RVR. Patient previously on apixaban PTA with last dose 02/20 in the evening.   Goal of  Therapy: Heparin level 0.3-0.7 units/ml Monitor platelets by anticoagulation protocol: Yes  Plan:  Anti-Xa level remains in range. Patient unable to receive oral therapy. Will continue heparin infusion at 1028mL/hr. Will obtain follow up anti-Xa level with am labs.   Pharmacy will continue to monitor and adjust per consult.   MLS 02/27/2019 8:34 AM

## 2019-02-27 NOTE — Progress Notes (Signed)
Pharmacy Electrolyte Monitoring Consult:  Pharmacy consulted to assist in monitoring and replacing electrolytes in this 73 y.o. female admitted on 02/19/2019 with Abdominal Pain   Labs:  Sodium (mmol/L)  Date Value  02/27/2019 134 (L)  01/02/2016 141  01/04/2015 139   Potassium (mmol/L)  Date Value  02/27/2019 2.9 (L)  01/04/2015 3.8   Magnesium (mg/dL)  Date Value  02/27/2019 1.9  01/03/2015 1.9   Phosphorus (mg/dL)  Date Value  02/27/2019 3.7   Calcium (mg/dL)  Date Value  02/27/2019 8.2 (L)   Calcium, Total (mg/dL)  Date Value  01/04/2015 8.5   Albumin (g/dL)  Date Value  02/22/2019 2.7 (L)  01/02/2016 3.9  08/29/2014 2.6 (L)    Assessment/Plan: Patient ordered D5 at 22mL/hr do to hypoglycemia. Will add 46mEq of potassium/liter. Will order magnesium 1g IV x 1 and potasisum 59mEq IV Q1hr x 3 doses.    Will recheck potassium at 1800 and all electrolytes with am labs.   During critical illness, will replace for goal potassium ~ 4 and goal magnesium ~ 2.   Will continue to monitor and adjust per consult.   MLS 02/27/2019 8:34 AM

## 2019-02-27 NOTE — Progress Notes (Deleted)
[place] for Heparin  Indication: atrial fibrillation  Allergies  Allergen Reactions  . Latex     Itching Itching    Patient Measurements: Height: 5\' 6"  (167.6 cm) Weight: 220 lb 14.4 oz (100.2 kg) IBW/kg (Calculated) : 59.3 Heparin Dosing Weight: 82 kg   Vital Signs: Temp: 98.3 F (36.8 C) (02/29 0200) Temp Source: Axillary (02/29 0200) BP: 76/64 (02/29 0600) Pulse Rate: 121 (02/29 0600)  Labs: Recent Labs    02/24/19 1323 02/24/19 2041 02/25/19 0342 02/25/19 1157 02/26/19 0653  HGB  --   --  11.1*  --  11.0*  HCT  --   --  35.7*  --  35.2*  PLT  --   --  196  --  199  APTT 74* 71* 71*  --   --   HEPARINUNFRC  --   --  0.58 0.52 0.52  CREATININE  --   --  0.66  --  0.60    Estimated Creatinine Clearance: 78.2 mL/min (by C-G formula based on SCr of 0.6 mg/dL).   Medical History: Past Medical History:  Diagnosis Date  . Barretts esophagus   . Chest pain    a. 02/2014 Myoview: Ef 50%, no ischemia.  . Colon polyps   . COPD (chronic obstructive pulmonary disease) ([place])   . Depression with anxiety   . GERD (gastroesophageal reflux disease)   . Neuropathy   . Permanent atrial fibrillation    a. s/p ablation 01/2012 in [facility] by Dr. Boyd Kerbs;  b. On sotalol & Xarelto;  c. 02/2014 Echo: EF 50-55%, mild conc LVH, nl LA size/structure;  d. Recurrent afib 8/15 & 08/29/2014.  [name] Rectal fistula   . Rheumatoid arthritis ([place])    On methotrexate and orencia  . Urinary incontinence   . Vitamin D deficiency     Medications:  Medications Prior to Admission  Medication Sig Dispense Refill Last Dose  . ELIQUIS 5 MG TABS tablet Take 1 tablet (5 mg total) by mouth 2 (two) times daily. 180 tablet 3 02/18/2019 at 0900  . esomeprazole (NEXIUM) 20 MG capsule Take 20 mg by mouth daily at 12 noon.   02/18/2019 at 1200  . Fluticasone-Umeclidin-Vilant (TRELEGY ELLIPTA) 100-62.5-25 MCG/INH AEPB Inhale 1 puff into the lungs daily. 60 each 2  02/19/2019 at 0530  . gabapentin (NEURONTIN) 300 MG capsule Take 1 capsule (300 mg total) by mouth 3 (three) times daily. 90 capsule 3 02/18/2019 at 2000  . HUMIRA PEN 40 MG/0.4ML PNKT Inject 40 mg as directed. Every 2 weeks   Past Month at Unknown time  . hydroxychloroquine (PLAQUENIL) 200 MG tablet Take 200 mg by mouth 2 (two) times daily.    02/18/2019 at 2000  . propranolol ER (INDERAL LA) 80 MG 24 hr capsule Take 1 capsule (80 mg total) by mouth daily. 90 capsule 3 02/18/2019 at 0800  . rosuvastatin (CRESTOR) 10 MG tablet Take 1 tablet (10 mg total) by mouth daily. 90 tablet 3 02/18/2019 at 2000  . albuterol (PROVENTIL HFA;VENTOLIN HFA) 108 (90 Base) MCG/ACT inhaler Inhale 1-2 puffs into the lungs every 4 (four) hours as needed for wheezing or shortness of breath. 1 Inhaler 10 Unknown at prn  . furosemide (LASIX) 20 MG tablet Take 1 tablet (20 mg total) by mouth daily as needed. 90 tablet 2 Unknown at prn  . ibuprofen (ADVIL,MOTRIN) 400 MG tablet Take 400 mg by mouth every 4 (four) hours as needed.    Unknown at prn  .  lidocaine (XYLOCAINE) 5 % ointment Apply 1 application topically as needed.   Unknown at prn  . potassium chloride (K-DUR) 10 MEQ tablet Take 1 tablet (10 mEq total) by mouth daily as needed. 90 tablet 2 Unknown at prn  . traMADol (ULTRAM) 50 MG tablet Take 1 tablet (50 mg total) by mouth every 12 (twelve) hours as needed for severe pain. 30 tablet 0 Unknown at prn    Assessment: Pharmacy consulted for heparin drip management for 73 year old female with permanent atrial fibrillation and chronic diastolic heart failure followed by Dr. Rockey Situ who presented to the emergency room due to abdominal pain and found to have acute appendicitis and atrial fibrillation with RVR. Patient previously on apixaban PTA with last dose 02/20 in the evening.   Goal of Therapy: Heparin level 0.3-0.7 units/ml Monitor platelets by anticoagulation protocol: Yes  Plan:  Anti-Xa level remains in range.  Patient unable to receive oral therapy. Will continue heparin infusion at 1048mL/hr. Will obtain follow up anti-Xa level with am labs.   Pharmacy will continue to monitor and adjust per consult.   MLS 02/27/2019 7:16 AM

## 2019-02-28 LAB — BASIC METABOLIC PANEL
Anion gap: 9 (ref 5–15)
BUN: 8 mg/dL (ref 8–23)
CO2: 25 mmol/L (ref 22–32)
Calcium: 8.7 mg/dL — ABNORMAL LOW (ref 8.9–10.3)
Chloride: 99 mmol/L (ref 98–111)
Creatinine, Ser: 0.62 mg/dL (ref 0.44–1.00)
GFR calc Af Amer: >60 mL/min (ref >60)
GFR calc non Af Amer: >60 mL/min (ref >60)
Glucose, Bld: 103 mg/dL — ABNORMAL HIGH (ref 70–99)
POTASSIUM: 3.4 mmol/L — AB (ref 3.5–5.1)
Sodium: 133 mmol/L — ABNORMAL LOW (ref 135–145)

## 2019-02-28 LAB — PHOSPHORUS: Phosphorus: 2.4 mg/dL — ABNORMAL LOW (ref 2.5–4.6)

## 2019-02-28 LAB — GLUCOSE, CAPILLARY
Glucose-Capillary: 100 mg/dL — ABNORMAL HIGH (ref 70–99)
Glucose-Capillary: 103 mg/dL — ABNORMAL HIGH (ref 70–99)
Glucose-Capillary: 103 mg/dL — ABNORMAL HIGH (ref 70–99)
Glucose-Capillary: 105 mg/dL — ABNORMAL HIGH (ref 70–99)
Glucose-Capillary: 118 mg/dL — ABNORMAL HIGH (ref 70–99)
Glucose-Capillary: 97 mg/dL (ref 70–99)

## 2019-02-28 LAB — CBC
HCT: 38.8 % (ref 36.0–46.0)
HEMOGLOBIN: 12.2 g/dL (ref 12.0–15.0)
MCH: 28 pg (ref 26.0–34.0)
MCHC: 31.4 g/dL (ref 30.0–36.0)
MCV: 89.2 fL (ref 80.0–100.0)
Platelets: 221 10*3/uL (ref 150–400)
RBC: 4.35 MIL/uL (ref 3.87–5.11)
RDW: 15.1 % (ref 11.5–15.5)
WBC: 8.1 10*3/uL (ref 4.0–10.5)
nRBC: 0 % (ref 0.0–0.2)

## 2019-02-28 LAB — HEPARIN LEVEL (UNFRACTIONATED): Heparin Unfractionated: 0.65 IU/mL (ref 0.30–0.70)

## 2019-02-28 LAB — MAGNESIUM: Magnesium: 2.1 mg/dL (ref 1.7–2.4)

## 2019-02-28 MED ORDER — AMIODARONE HCL IN DEXTROSE 360-4.14 MG/200ML-% IV SOLN
60.0000 mg/h | INTRAVENOUS | Status: DC
  Administered 2019-02-28: 60 mg/h via INTRAVENOUS
  Filled 2019-02-28 (×2): qty 200

## 2019-02-28 MED ORDER — FAMOTIDINE 20 MG PO TABS
20.0000 mg | ORAL_TABLET | Freq: Two times a day (BID) | ORAL | Status: DC
  Administered 2019-02-28 – 2019-03-08 (×15): 20 mg via ORAL
  Filled 2019-02-28 (×16): qty 1

## 2019-02-28 MED ORDER — AMIODARONE LOAD VIA INFUSION
150.0000 mg | Freq: Once | INTRAVENOUS | Status: AC
  Administered 2019-02-28: 150 mg via INTRAVENOUS
  Filled 2019-02-28: qty 83.34

## 2019-02-28 MED ORDER — SODIUM CHLORIDE 0.9 % IV BOLUS
250.0000 mL | Freq: Once | INTRAVENOUS | Status: AC
  Administered 2019-02-28: 250 mL via INTRAVENOUS

## 2019-02-28 MED ORDER — METOPROLOL TARTRATE 50 MG PO TABS
50.0000 mg | ORAL_TABLET | Freq: Once | ORAL | Status: AC
  Administered 2019-02-28: 50 mg via ORAL
  Filled 2019-02-28: qty 1

## 2019-02-28 MED ORDER — DEXTROSE-NACL 5-0.9 % IV SOLN
INTRAVENOUS | Status: DC
  Administered 2019-02-28 – 2019-03-01 (×2): via INTRAVENOUS

## 2019-02-28 MED ORDER — POTASSIUM CHLORIDE 10 MEQ/100ML IV SOLN
10.0000 meq | INTRAVENOUS | Status: AC
  Administered 2019-02-28 (×2): 10 meq via INTRAVENOUS
  Filled 2019-02-28 (×2): qty 100

## 2019-02-28 MED ORDER — AMIODARONE HCL IN DEXTROSE 360-4.14 MG/200ML-% IV SOLN
30.0000 mg/h | INTRAVENOUS | Status: DC
  Administered 2019-03-01 – 2019-03-02 (×3): 30 mg/h via INTRAVENOUS
  Filled 2019-02-28 (×4): qty 200

## 2019-02-28 NOTE — Progress Notes (Signed)
[facility] Hospital Day(s): 9.   Post op day(s): 9 Days Post-Op.   Interval History: Patient seen and examined, no acute events or new complaints overnight. Patient with significant mental improvement today.  She told me her name "my name is Brandy Armstrong".  She was also required to the mornings..  Nurse told me that she was able to ate some puree.    Vital signs in last 24 hours: [min-max] current  Temp:  [98.2 F (36.8 C)-99.2 F (37.3 C)] 99.2 F (37.3 C) (03/01 0800) Pulse Rate:  [93-144] 120 (03/01 0600) Resp:  [17-26] 22 (03/01 0600) BP: (87-129)/(46-113) 117/83 (03/01 0600) SpO2:  [94 %-100 %] 100 % (03/01 0844) Weight:  [96.8 kg] 96.8 kg (03/01 0500)     Height: 5\' 6"  (167.6 cm) Weight: 96.8 kg BMI (Calculated): 34.46   Physical Exam:  Constitutional: alert, cooperative and no distress  Respiratory: breathing non-labored at rest  Cardiovascular: regular rate and sinus rhythm  Gastrointestinal: soft, non-tender, and non-distended.  Wound dry and clean  Labs:  CBC Latest Ref Rng & Units 02/28/2019 02/27/2019 02/26/2019  WBC 4.0 - 10.5 K/uL 8.1 8.3 7.5  Hemoglobin 12.0 - 15.0 g/dL 12.2 11.9(L) 11.0(L)  Hematocrit 36.0 - 46.0 % 38.8 38.4 35.2(L)  Platelets 150 - 400 K/uL 221 204 199   CMP Latest Ref Rng & Units 02/28/2019 02/27/2019 02/27/2019  Glucose 70 - 99 mg/dL 103(H) 101(H) 89  BUN 8 - 23 mg/dL 8 8 9   Creatinine 0.44 - 1.00 mg/dL 0.62 0.68 0.63  Sodium 135 - 145 mmol/L 133(L) 137 134(L)  Potassium 3.5 - 5.1 mmol/L 3.4(L) 3.0(L) 2.9(L)  Chloride 98 - 111 mmol/L 99 100 99  CO2 22 - 32 mmol/L 25 26 21(L)  Calcium 8.9 - 10.3 mg/dL 8.7(L) 8.4(L) 8.2(L)  Total Protein 6.5 - 8.1 g/dL - - -  Total Bilirubin 0.3 - 1.2 mg/dL - - -  Alkaline Phos 38 - 126 U/L - - -  AST 15 - 41 U/L - - -  ALT 0 - 44 U/L - - -    Imaging studies: No new pertinent imaging studies   Assessment/Plan:  73 y.o.femalewith acute perforated appendicitis8Day Post-Ops/p  laparoscopic converted to open appendectomy, complicated by pertinent comorbidities includingatrial fibrillation on anticoagulation, COPD, rheumatoid arthritis. Patient with significant metabolic encephalopathy with  MRI negative for stroke.  This is significantly improved.  Today patient oriented in person but disoriented in time and place.  She was able to tolerate pured diet.  I agree to advance as tolerated and as patient is getting more alert and able to eat more solid food.  I appreciate critical care service for the management of the encephalopathy septic shock that resolved and other medical comorbidities.  I appreciate cardiology for the management of A. fib and anticoagulation.  We will continue with therapeutic anticoagulation.  Arnold Long, MD

## 2019-02-28 NOTE — Progress Notes (Signed)
PHARMACIST - PHYSICIAN COMMUNICATION  CONCERNING: IV to Oral Route Change Policy  RECOMMENDATION: This patient is receiving famotidine by the intravenous route.  Based on criteria approved by the Pharmacy and Therapeutics Committee, the intravenous medication(s) is/are being converted to the equivalent oral dose form(s).   DESCRIPTION: These criteria include:  The patient is eating (either orally or via tube) and/or has been taking other orally administered medications for a least 24 hours  The patient has no evidence of active gastrointestinal bleeding or impaired GI absorption (gastrectomy, short bowel, patient on TNA or NPO).  If you have questions about this conversion, please contact the Pharmacy Department   []   [phone] )  [place], [facility] 02/28/2019 2:47 PM

## 2019-02-28 NOTE — Progress Notes (Addendum)
Progress Note  Patient Name: Brandy Armstrong Date of Encounter: 02/28/2019  Primary Cardiologist: Ida Rogue, MD   Patient Profile     73 y.o. female with a hx of permanent Afib on Eliquis s/p ablation in 2013 with recurrent Afib s/p repeat ablation in 01/2015 s/p DCCV in 02/2015 s/p DCCV in 08/2016 with recurrent Afib 1 month later leading to the discontinuation of amiodarone and rate control strategy since, aortic atherosclerosis, chronic diastolic CHF, GI bleed on Xarelto, COPD secondary to tobacco abuse quitting in 2012, and anemia, who was admitted 2/21 with acute appendicitis and rapid afib. Now s/p open appendectomy.   Subjective   Alert and with out complaints  I have been called earlier in the morning with hypotension.  We discontinued esmolol and gave her a fluid bolus.  Blood pressure improved.  Inpatient Medications    Scheduled Meds: . budesonide (PULMICORT) nebulizer solution  0.25 mg Nebulization Q12H  . chlorhexidine gluconate (MEDLINE KIT)  15 mL Mouth Rinse BID  . furosemide  40 mg Intravenous Daily  . ipratropium  0.5 mg Nebulization BID  . levalbuterol  0.63 mg Nebulization BID  . metoprolol tartrate  10 mg Intravenous Q6H   Continuous Infusions: . dextrose 5 % and 0.9% NaCl 50 mL/hr at 02/28/19 1029  . esmolol Stopped (02/28/19 1018)  . famotidine (PEPCID) IV 20 mg (02/28/19 1139)  . heparin 1,050 Units/hr (02/28/19 0600)  . potassium chloride 10 mEq (02/28/19 1214)   PRN Meds: acetaminophen **OR** acetaminophen, fentaNYL (SUBLIMAZE) injection, HYDROcodone-acetaminophen, ondansetron **OR** ondansetron (ZOFRAN) IV   Vital Signs    Vitals:   02/28/19 0900 02/28/19 0930 02/28/19 1000 02/28/19 1051  BP: 113/84 (!) 89/71  128/75  Pulse: 64 (!) 124 (!) 124 (!) 131  Resp: (!) 24 (!) 29 (!) 23   Temp:      TempSrc:      SpO2: 100% 97% 95%   Weight:      Height:        Intake/Output Summary (Last 24 hours) at 02/28/2019 1218 Last data filed at 02/28/2019  1050 Gross per 24 hour  Intake 3260.3 ml  Output 3600 ml  Net -339.7 ml   Last 3 Weights 02/28/2019 02/22/2019 02/10/2019  Weight (lbs) 213 lb 6.5 oz 220 lb 14.4 oz 221 lb 3.2 oz  Weight (kg) 96.8 kg 100.2 kg 100.336 kg      Telemetry    Telemetry Personally reviewed atrial fibrillation with rate 100-150  ECG       Physical Exam  Well developed and nourished in no acute distress HENT normal Neck supple  Clear Irregularly irregular rate and rhythm with rapid ventricular response, no murmurs or gallops Abd-soft with active BS without hepatomegaly No Clubbing cyanosis edema Skin-warm and dry A & Oriented X 1  Grossly normal sensory and motor function    Labs    Chemistry Recent Labs  Lab 02/22/19 0641  02/27/19 0731 02/27/19 1928 02/28/19 0549  NA 138   < > 134* 137 133*  K 4.4   < > 2.9* 3.0* 3.4*  CL 106   < > 99 100 99  CO2 24   < > 21* 26 25  GLUCOSE 105*   < > 89 101* 103*  BUN 15   < > _0 CREATININE 0.69   < > 0.63 0.68 0.62  CALCIUM 8.8*   < > 8.2* 8.4* 8.7*  PROT 6.3*  --   --   --   --  ALBUMIN 2.7*  --   --   --   --   AST 16  --   --   --   --   ALT 9  --   --   --   --   ALKPHOS 53  --   --   --   --   BILITOT 0.7  --   --   --   --   GFRNONAA >60   < > >60 >60 >60  GFRAA >60   < > >60 >60 >60  ANIONGAP 8   < > _0 < > = values in this interval not displayed.     Hematology Recent Labs  Lab 02/26/19 0653 02/27/19 0731 02/28/19 0549  WBC 7.5 8.3 8.1  RBC 3.99 4.34 4.35  HGB 11.0* 11.9* 12.2  HCT 35.2* 38.4 38.8  MCV 88.2 88.5 89.2  MCH 27.6 27.4 28.0  MCHC 31.3 31.0 31.4  RDW 15.0 15.2 15.1  PLT 199 204 221    Cardiac EnzymesNo results for input(s): TROPONINI in the last 168 hours. No results for input(s): TROPIPOC in the last 168 hours.   BNPNo results for input(s): BNP, PROBNP in the last 168 hours.   DDimer No results for input(s): DDIMER in the last 168 hours.   Radiology    No results found.  Cardiac Studies    2D Echocardiogram 2.22.2020  1. The left ventricle has hyperdynamic systolic function, with an ejection fraction of >65%. The cavity size was normal. There is mildly increased left ventricular wall thickness. Left ventricular diastology could not be evaluated secondary to atrial  fibrillation. 2. The right ventricle has normal systolic function. The cavity was normal. There is no increase in right ventricular wall thickness. 3. The mitral valve is normal in structure. 4. The tricuspid valve is normal in structure. 5. The aortic valve is normal in structure. Mild thickening of the aortic valve no stenosis of the aortic valve. 6. The interatrial septum was not assessed.   Assessment & Plan     Atrial fibrillation with rapid ventricular response:    Hypokalemia  Persistent   Heart failure-diastolic-acute/chronic  Hypotension  Encephalopathy improved    Acute appendicitis status post appendectomy on February 21.    Hypotension with esmolol.  We will begin IV amiodarone.  Suspect she would benefit from early TEE guided cardioversion perhaps Tuesday-Wednesday.  Continue potassium repletion.  Seems euvolemic.    For questions or updates, please contact [place] Please consult [email or web address] for contact info under        Signed, Virl Axe, MD  02/28/2019, 12:18 PM

## 2019-02-28 NOTE — Progress Notes (Signed)
73 y.o. female pt known to me from office (chronic asthmatic bronchitis) presented to [facility] ED with abdominal pain.  CT of abdomen revealed acute appendicitis.  Underwent laparoscopic appendectomy 2/21.  Remained intubated postprocedure. POST OP AFIB WITH RVR AND DELERIUM   CHIEF COMPLAINT:   FOLLOW UP RESP FAILURE   Subjective  NAD THIS AM LESS CONFUSED DRINKING MORE NO MORE NG ATTEMPTS NEEDED ON ESOMOLOL INFUSION FOLLOW UP CARDIOLOGY RECS     Objective   VITALS: BP 117/83   Pulse (!) 120   Temp 99.2 F (37.3 C) (Axillary)   Resp (!) 22   Ht 5\' 6"  (1.676 m)   Wt 96.8 kg   SpO2 98%   BMI 34.44 kg/m    Review of Systems:  Gen:  Denies  fever, sweats, chills weigh loss  HEENT: Denies blurred vision, double vision, ear pain, eye pain, hearing loss, nose bleeds, sore throat Cardiac:  No dizziness, chest pain or heaviness, chest tightness,edema, No JVD Resp:  -SOB  Gi: Denies swallowing difficulty, stomach pain, nausea or vomiting, diarrhea, constipation, bowel incontinence Other:  All other systems negative  Physical Examination:   GENERAL:NAD, no fevers, chills, no weakness no fatigue HEAD: Normocephalic, atraumatic.  EYES: PERLA, EOMI No scleral icterus.  MOUTH: Moist mucosal membrane.  EAR, NOSE, THROAT: Clear without exudates. No external lesions.  NECK: Supple. No thyromegaly.  No JVD.  PULMONARY: CTA B/L no wheezing, rhonchi, crackles CARDIOVASCULAR: S1 and S2. IR-Regular rate and rhythm. No murmurs GASTROINTESTINAL: Soft, nontender, nondistended. Positive bowel sounds.  MUSCULOSKELETAL: No swelling, clubbing, or edema.  NEUROLOGIC: No gross focal neurological deficits. 5/5 strength all extremities SKIN: No ulceration, lesions, rashes, or cyanosis.  PSYCHIATRIC: Insight, judgment intact. -depression -anxiety ALL OTHER ROS ARE NEGATIVE       I personally reviewed Labs under Results section.     CULTURE RESULTS   Recent Results (from the past 240  hour(s))  Urine culture     Status: None   Collection Time: 02/19/19 10:05 PM  Result Value Ref Range Status   Specimen Description   Final    URINE, RANDOM Performed at [facility], [address] [place], [place] [address]    Special Requests   Final    NONE Performed at [facility], [address], [place], [place] [address]    Culture   Final    NO GROWTH Performed at [facility], [place] [address] [place], [place] [address]    Report Status 02/21/2019 FINAL  Final  MRSA PCR Screening     Status: None   Collection Time: 02/19/19 10:05 PM  Result Value Ref Range Status   MRSA by PCR NEGATIVE NEGATIVE Final    Comment:        The GeneXpert MRSA Assay (FDA approved for NASAL specimens only), is one component of a comprehensive MRSA colonization surveillance program. It is not intended to diagnose MRSA infection nor to guide or monitor treatment for MRSA infections. Performed at [facility], [address] [place], [place] [address]           IMAGING    No results found.      ASSESSMENT AND PLAN SYNOPSIS  73 yo acute ruptured appendix post op resp failure resolved complicated by acute AFIB WITH RVR difficult to control on beta blocker therapy  AFIB WITH RVR FOLLOW UP CARDIOLOGY RECS ESMOLOL INFUSION BETA BLOCKER THERAPY  DELIRIUM SLOWLY RESOLVING NEURO WORK UP NEG   REMAINS SD STATUS  Corrin Parker, M.D.  [facility]  Medical Director [place] Director [facility] Cardio-Pulmonary Department

## 2019-02-28 NOTE — Progress Notes (Signed)
[place] at [place] NAME: Brandy Armstrong    MR#:  [id number]  DATE OF BIRTH:  May 11, 1946  SUBJECTIVE:  CHIEF COMPLAINT:   Chief Complaint  Patient presents with  . Abdominal Pain  alert but pleasantly confused at times, tachycardic REVIEW OF SYSTEMS:  Review of Systems  Constitutional: Negative for chills and fever.  HENT: Negative for hearing loss and tinnitus.   Eyes: Negative for blurred vision and double vision.  Respiratory: Negative for cough, sputum production and shortness of breath.   Cardiovascular: Negative for chest pain and palpitations.  Gastrointestinal: Negative for abdominal pain, heartburn, nausea and vomiting.  Genitourinary: Negative for dysuria and urgency.  Musculoskeletal: Negative for myalgias and neck pain.  Skin: Negative for itching and rash.  Neurological: Negative for dizziness and headaches.  Psychiatric/Behavioral: Negative for depression and hallucinations.      DRUG ALLERGIES:   Allergies  Allergen Reactions  . Latex     Itching Itching   VITALS:  Blood pressure 99/81, pulse (!) 110, temperature 99.2 F (37.3 C), temperature source Axillary, resp. rate 17, height 5\' 6"  (1.676 m), weight 96.8 kg, SpO2 96 %. PHYSICAL EXAMINATION:  Physical Exam  Constitutional: She appears well-developed and well-nourished.  HENT:  Head: Normocephalic and atraumatic.  Eyes: Pupils are equal, round, and reactive to light. Conjunctivae and EOM are normal.  Neck: Normal range of motion. Neck supple. No tracheal deviation present. No thyromegaly present.  Cardiovascular: Regular rhythm. Tachycardia present.  Respiratory: Effort normal and breath sounds normal.  GI: Soft. Bowel sounds are normal. There is no abdominal tenderness.  Musculoskeletal: Normal range of motion.        General: No edema.  Neurological: She is alert.  Patient   Skin: Skin is warm. No erythema.  Psychiatric: She has a normal mood and  affect. Her behavior is normal.   LABORATORY PANEL:  Female CBC Recent Labs  Lab 02/28/19 0549  WBC 8.1  HGB 12.2  HCT 38.8  PLT 221   ------------------------------------------------------------------------------------------------------------------ Chemistries  Recent Labs  Lab 02/22/19 0641  02/28/19 0549  NA 138   < > 133*  K 4.4   < > 3.4*  CL 106   < > 99  CO2 24   < > 25  GLUCOSE 105*   < > 103*  BUN 15   < > 8  CREATININE 0.69   < > 0.62  CALCIUM 8.8*   < > 8.7*  MG 2.2   < > 2.1  AST 16  --   --   ALT 9  --   --   ALKPHOS 53  --   --   BILITOT 0.7  --   --    < > = values in this interval not displayed.   RADIOLOGY:  No results found. ASSESSMENT AND PLAN:  73 year old female with permanent atrial fibrillation and chronic diastolic heart failure followed by Dr. Rockey Situ who presented to the emergency room due to abdominal pain and found to have acute appendicitis and atrial fibrillation with RVR.  1.  Atrial fibrillation with RVR:  Being managed by cardiologist.  Eliquis was placed on hold due to surgery done on 02/19/2019   - Cardio switched her to amiodarone drip in place of esmolol drip due to hypotension.  Patient status post ablation and has had cardioversions in the past. Last echocardiogram 2017 showing normal ejection fraction Eliquis currently on hold since patient was not able to take  p.o. Patient on heparin drip for now until able to tolerate p.o. intake -  May need TEE guided cardioversion perhaps Tuesday-Wednesday per Dr Bosie Helper.  2.  Acute perforated appendicitis:  Patient status post surgery on 02/19/2019.   Being managed by surgical team.   3 COPD without signs of exacerbation  4.  Rheumatoid arthritis: Continue outpatient regimen  5.  Hypokalemia: Replaced  6.  Acute metabolic encephalopathy. Patient reported to be more confused .  Disoriented.  Not following instructions.  MRI of the brain done with atrophy and chronic ischemic changes  without acute intracranial abnormalities. Being managed by critical care team.  DVT prophylaxis; patient currently on heparin drip    All the records are reviewed and case discussed with Care Management/Social Worker. Management plans discussed with the patient, family and they are in agreement.  CODE STATUS: Full Code  TOTAL TIME TAKING CARE OF THIS PATIENT: 15 minutes.   More than 50% of the time was spent in counseling/coordination of care: YES  POSSIBLE D/C IN 3-4 DAYS, DEPENDING ON CLINICAL CONDITION.   Max Sane M.D on 02/28/2019 at 3:27 PM  Between 7am to 6pm - Pager - [phone]  After 6pm go to [email or web address] - Proofreader  [facility]  Office  [phone]  CC: Primary care physician; [name], FNP  Note: This dictation was prepared with Dragon dictation along with smaller phrase technology. Any transcriptional errors that result from this process are unintentional.

## 2019-02-28 NOTE — Progress Notes (Signed)
Pharmacy Electrolyte Monitoring Consult:  Pharmacy consulted to assist in monitoring and replacing electrolytes in this 73 y.o. female admitted on 02/19/2019 with Abdominal Pain   Labs:  Sodium (mmol/L)  Date Value  02/28/2019 133 (L)  01/02/2016 141  01/04/2015 139   Potassium (mmol/L)  Date Value  02/28/2019 3.4 (L)  01/04/2015 3.8   Magnesium (mg/dL)  Date Value  02/28/2019 2.1  01/03/2015 1.9   Phosphorus (mg/dL)  Date Value  02/28/2019 2.4 (L)   Calcium (mg/dL)  Date Value  02/28/2019 8.7 (L)   Calcium, Total (mg/dL)  Date Value  01/04/2015 8.5   Albumin (g/dL)  Date Value  02/22/2019 2.7 (L)  01/02/2016 3.9  08/29/2014 2.6 (L)    Assessment/Plan: Cardiology adjusted electrolytes and removed potassium from fluids. Patient received potassium 50mEq IV today.   Will recheck all electrolytes with am labs.   During critical illness, will replace for goal potassium ~ 4 and goal magnesium ~ 2.   Will continue to monitor and adjust per consult.   MLS 02/28/2019 4:04 PM

## 2019-02-28 NOTE — Progress Notes (Signed)
Pt with BP 89/71 (71), Irregular HR 120-140. Dr Jens Som with cardio made aware-> orders to STOP esmolol-> give 50mg  metop tartrate PO, 250cc bolus x1/1hr, and switched fluids to D5NS at 50. Dr Jens Som to see pt soon.

## 2019-02-28 NOTE — Progress Notes (Signed)
PT Cancellation Note  Patient Details Name: Brandy Armstrong MRN: [id number] DOB: 1946-08-27   Cancelled Treatment:    Reason Eval/Treat Not Completed: Medical issues which prohibited therapy- Received new PT order, however patient's HR continues to be in 130s at rest. Will re-eval when medically appropriate.     [name] 02/28/2019, 12:30 PM

## 2019-02-28 NOTE — Progress Notes (Signed)
[place] for Heparin  Indication: atrial fibrillation  Allergies  Allergen Reactions  . Latex     Itching Itching    Patient Measurements: Height: 5\' 6"  (167.6 cm) Weight: 213 lb 6.5 oz (96.8 kg) IBW/kg (Calculated) : 59.3 Heparin Dosing Weight: 82 kg   Vital Signs: Temp: 99.1 F (37.3 C) (03/01 0200) Temp Source: Oral (03/01 0200) BP: 117/83 (03/01 0600) Pulse Rate: 120 (03/01 0600)  Labs: Recent Labs    02/26/19 0653 02/27/19 0731 02/27/19 1928 02/28/19 0549  HGB 11.0* 11.9*  --  12.2  HCT 35.2* 38.4  --  38.8  PLT 199 204  --  221  HEPARINUNFRC 0.52 0.47  --  0.65  CREATININE 0.60 0.63 0.68 0.62    Estimated Creatinine Clearance: 76.8 mL/min (by C-G formula based on SCr of 0.62 mg/dL).   Medical History: Past Medical History:  Diagnosis Date  . Barretts esophagus   . Chest pain    a. 02/2014 Myoview: Ef 50%, no ischemia.  . Colon polyps   . COPD (chronic obstructive pulmonary disease) ([place])   . Depression with anxiety   . GERD (gastroesophageal reflux disease)   . Neuropathy   . Permanent atrial fibrillation    a. s/p ablation 01/2012 in [facility] by Dr. Boyd Kerbs;  b. On sotalol & Xarelto;  c. 02/2014 Echo: EF 50-55%, mild conc LVH, nl LA size/structure;  d. Recurrent afib 8/15 & 08/29/2014.  [name] Rectal fistula   . Rheumatoid arthritis ([place])    On methotrexate and orencia  . Urinary incontinence   . Vitamin D deficiency     Medications:  Medications Prior to Admission  Medication Sig Dispense Refill Last Dose  . ELIQUIS 5 MG TABS tablet Take 1 tablet (5 mg total) by mouth 2 (two) times daily. 180 tablet 3 02/18/2019 at 0900  . esomeprazole (NEXIUM) 20 MG capsule Take 20 mg by mouth daily at 12 noon.   02/18/2019 at 1200  . Fluticasone-Umeclidin-Vilant (TRELEGY ELLIPTA) 100-62.5-25 MCG/INH AEPB Inhale 1 puff into the lungs daily. 60 each 2 02/19/2019 at 0530  . gabapentin (NEURONTIN) 300 MG capsule Take 1 capsule (300  mg total) by mouth 3 (three) times daily. 90 capsule 3 02/18/2019 at 2000  . HUMIRA PEN 40 MG/0.4ML PNKT Inject 40 mg as directed. Every 2 weeks   Past Month at Unknown time  . hydroxychloroquine (PLAQUENIL) 200 MG tablet Take 200 mg by mouth 2 (two) times daily.    02/18/2019 at 2000  . propranolol ER (INDERAL LA) 80 MG 24 hr capsule Take 1 capsule (80 mg total) by mouth daily. 90 capsule 3 02/18/2019 at 0800  . rosuvastatin (CRESTOR) 10 MG tablet Take 1 tablet (10 mg total) by mouth daily. 90 tablet 3 02/18/2019 at 2000  . albuterol (PROVENTIL HFA;VENTOLIN HFA) 108 (90 Base) MCG/ACT inhaler Inhale 1-2 puffs into the lungs every 4 (four) hours as needed for wheezing or shortness of breath. 1 Inhaler 10 Unknown at prn  . furosemide (LASIX) 20 MG tablet Take 1 tablet (20 mg total) by mouth daily as needed. 90 tablet 2 Unknown at prn  . ibuprofen (ADVIL,MOTRIN) 400 MG tablet Take 400 mg by mouth every 4 (four) hours as needed.    Unknown at prn  . lidocaine (XYLOCAINE) 5 % ointment Apply 1 application topically as needed.   Unknown at prn  . potassium chloride (K-DUR) 10 MEQ tablet Take 1 tablet (10 mEq total) by mouth daily as needed. [place]  tablet 2 Unknown at prn  . traMADol (ULTRAM) 50 MG tablet Take 1 tablet (50 mg total) by mouth every 12 (twelve) hours as needed for severe pain. 30 tablet 0 Unknown at prn    Assessment: Pharmacy consulted for heparin drip management for 73 year old female with permanent atrial fibrillation and chronic diastolic heart failure followed by Dr. Rockey Situ who presented to the emergency room due to abdominal pain and found to have acute appendicitis and atrial fibrillation with RVR. Patient previously on apixaban PTA with last dose 02/20 in the evening.   Goal of Therapy: Heparin level 0.3-0.7 units/ml Monitor platelets by anticoagulation protocol: Yes  Plan:  03/01 @ 0549 HL 0.65 therapeutic. Will continue rate at 1050 units/hr and will recheck HL w/ am labs, CBC  stable, will continue to monitor.  Tobie Lords, PharmD, BCPS Clinical Pharmacist 02/28/2019

## 2019-03-01 ENCOUNTER — Inpatient Hospital Stay: Payer: Medicare Other

## 2019-03-01 ENCOUNTER — Inpatient Hospital Stay: Payer: Self-pay

## 2019-03-01 DIAGNOSIS — F05 Delirium due to known physiological condition: Secondary | ICD-10-CM

## 2019-03-01 LAB — BASIC METABOLIC PANEL
Anion gap: 8 (ref 5–15)
BUN: 7 mg/dL — ABNORMAL LOW (ref 8–23)
CHLORIDE: 105 mmol/L (ref 98–111)
CO2: 23 mmol/L (ref 22–32)
Calcium: 8.3 mg/dL — ABNORMAL LOW (ref 8.9–10.3)
Creatinine, Ser: 0.52 mg/dL (ref 0.44–1.00)
GFR calc Af Amer: >60 mL/min (ref >60)
GFR calc non Af Amer: >60 mL/min (ref >60)
Glucose, Bld: 118 mg/dL — ABNORMAL HIGH (ref 70–99)
Potassium: 3.3 mmol/L — ABNORMAL LOW (ref 3.5–5.1)
Sodium: 136 mmol/L (ref 135–145)

## 2019-03-01 LAB — GLUCOSE, CAPILLARY
Glucose-Capillary: 101 mg/dL — ABNORMAL HIGH (ref 70–99)
Glucose-Capillary: 103 mg/dL — ABNORMAL HIGH (ref 70–99)
Glucose-Capillary: 104 mg/dL — ABNORMAL HIGH (ref 70–99)
Glucose-Capillary: 105 mg/dL — ABNORMAL HIGH (ref 70–99)
Glucose-Capillary: 115 mg/dL — ABNORMAL HIGH (ref 70–99)
Glucose-Capillary: 95 mg/dL (ref 70–99)

## 2019-03-01 LAB — HEPARIN LEVEL (UNFRACTIONATED)
Heparin Unfractionated: 0.69 IU/mL (ref 0.30–0.70)
Heparin Unfractionated: 0.7 IU/mL (ref 0.30–0.70)

## 2019-03-01 LAB — C DIFFICILE QUICK SCREEN W PCR REFLEX
C Diff antigen: NEGATIVE
C Diff interpretation: NOT DETECTED
C Diff toxin: NEGATIVE

## 2019-03-01 LAB — MAGNESIUM: Magnesium: 1.9 mg/dL (ref 1.7–2.4)

## 2019-03-01 LAB — PHOSPHORUS: Phosphorus: 3.5 mg/dL (ref 2.5–4.6)

## 2019-03-01 MED ORDER — POTASSIUM CHLORIDE 20 MEQ PO PACK
40.0000 meq | PACK | Freq: Two times a day (BID) | ORAL | Status: DC
  Administered 2019-03-01: 40 meq via ORAL
  Filled 2019-03-01: qty 2

## 2019-03-01 MED ORDER — SODIUM CHLORIDE 0.9% FLUSH
10.0000 mL | Freq: Two times a day (BID) | INTRAVENOUS | Status: DC
  Administered 2019-03-01 – 2019-03-06 (×9): 10 mL

## 2019-03-01 MED ORDER — KCL IN DEXTROSE-NACL 40-5-0.9 MEQ/L-%-% IV SOLN
INTRAVENOUS | Status: DC
  Administered 2019-03-01 (×2): 50 mL/h via INTRAVENOUS
  Administered 2019-03-02: 12:00:00 via INTRAVENOUS
  Filled 2019-03-01 (×4): qty 1000

## 2019-03-01 MED ORDER — APIXABAN 5 MG PO TABS: 5.0000 mg | ORAL_TABLET | Freq: Two times a day (BID) | ORAL | Status: DC

## 2019-03-01 MED ORDER — METOPROLOL TARTRATE 5 MG/5ML IV SOLN: 5.0000 mg | Freq: Four times a day (QID) | INTRAVENOUS | Status: DC | PRN

## 2019-03-01 MED ORDER — METOPROLOL TARTRATE 25 MG PO TABS
25.0000 mg | ORAL_TABLET | Freq: Four times a day (QID) | ORAL | Status: DC
  Administered 2019-03-01 – 2019-03-03 (×7): 25 mg via ORAL
  Filled 2019-03-01 (×7): qty 1

## 2019-03-01 MED ORDER — HEPARIN (PORCINE) 25000 UT/250ML-% IV SOLN
1000.0000 [IU]/h | INTRAVENOUS | Status: DC
  Administered 2019-03-01: 1000 [IU]/h via INTRAVENOUS
  Filled 2019-03-01: qty 250

## 2019-03-01 MED ORDER — SODIUM CHLORIDE 0.9% FLUSH: 10.0000 mL | INTRAVENOUS | Status: DC | PRN

## 2019-03-01 NOTE — Progress Notes (Signed)
Busy day. Has stooled almost every hour. C.Diff negative. Perineum area reddened from incontinence. Able to carry on conversation with nurse. Ask if her memory would ever return. Named her dog. Recognized a cousin. Much more alert than last week.

## 2019-03-01 NOTE — Evaluation (Signed)
Physical Therapy Evaluation Patient Details Name: Brandy Armstrong MRN: [id number] DOB: Mar 04, 1946 Today's Date: 03/01/2019   History of Present Illness  Pt is a 73 y.o. female presented to [facility] ED with abdominal pain.  CT of abdomen revealed acute appendicitis.  Underwent laparoscopic which was converted to open appendectomy 2/21 secondary acute perforated appendicitis.  Remained intubated post procedure until 2/22. Pt with significant confusion since surgery; MRI negative for acute findings.  Atrial fibrillation with RVR and delerium post-op.  PMH includes: a-fib, AAA, vertigo, COPD & RA.   Clinical Impression  Pt seen for PT/OT co-evaluation.  Pt oriented to name only but able to verbalize she was in a hospital setting and that she had appendicitis (but did not know she had surgery).  Increased time required for pt to respond to therapists questions and pt did better with closed ended questions (vs open ended questions).  Pt appearing aware that she could not remember what she usually could.  Prior to hospital admission, pt appears to have been modified independent with functional mobility using RW.  Pt lives at [facility] living facility with her pet dog.  Currently pt is 2 assist with bed mobility and CGA to min assist to maintain sitting balance edge of bed.  Pt's HR increased up to 120 bpm (from about 100 bpm at rest) with minimal activity so deferred further mobility attempts.  Overall pt demonstrating generalized weakness.  Pt would benefit from skilled PT to address noted impairments and functional limitations (see below for any additional details).  Upon hospital discharge, recommend pt discharge to [place].     Follow Up Recommendations SNF    Equipment Recommendations  Rolling walker with 5" wheels    Recommendations for Other Services OT consult     Precautions / Restrictions Precautions Precautions: Fall Precaution Comments: JP drain L inferior abdomen; latex  allergy Restrictions Weight Bearing Restrictions: No      Mobility  Bed Mobility Overal bed mobility: Needs Assistance Bed Mobility: Supine to Sit;Sit to Supine     Supine to sit: Mod assist;+2 for physical assistance;+2 for safety/equipment;HOB elevated Sit to supine: Mod assist;+2 for physical assistance;+2 for safety/equipment;HOB elevated   General bed mobility comments: assist for trunk and B LE's semi-supine to/from sit; minimal use of R UE d/t heparin drip IV near bend of elbow; vc's for technique required  Transfers                 General transfer comment: Deferred for pt safety. Pt on heparin drip and with HR reaching 120's with minimal exertion.   Ambulation/Gait                Stairs            Wheelchair Mobility    Modified Rankin (Stroke Patients Only)       Balance Overall balance assessment: Needs assistance Sitting-balance support: Bilateral upper extremity supported;Feet supported Sitting balance-Leahy Scale: Poor Sitting balance - Comments: Fluctuating between CGA to min assist for sitting balance for approximately 15 minutes.                                     Pertinent Vitals/Pain Pain Assessment: Faces Faces Pain Scale: Hurts little more Pain Location: Pt denies pain at rest. While seated EOB complained of pain in groin, suspect catheter site. Pt also endorsed pain in R hand at IV site.  Pain  Descriptors / Indicators: Grimacing;Guarding Pain Intervention(s): Limited activity within patient's tolerance;Monitored during session;Repositioned  O2 sats WFL during session (on room air); BP 120/78 semi-supine in bed and was 111/69 sitting edge of bed.    Home Living Family/patient expects to be discharged to:: Private residence([facility]) Living Arrangements: Alone Available Help at Discharge: Family;Available PRN/intermittently Type of Home: Apartment Home Access: Level entry     Home  Layout: One level Home Equipment: Walker - 2 wheels;Grab bars - toilet;Hand held shower head;Grab bars - tub/shower Additional Comments: No family available at time of evaluation to verify home set-up.     Prior Function Level of Independence: Independent         Comments: Per case mgmt note 2/27 family confirms pt lives alone and was independent at baseline, driving, community ambulator.  Has dog.     Hand Dominance   Dominant Hand: Right    Extremity/Trunk Assessment   Upper Extremity Assessment Upper Extremity Assessment: Defer to OT evaluation    Lower Extremity Assessment Lower Extremity Assessment: Generalized weakness    Cervical / Trunk Assessment Cervical / Trunk Assessment: Normal  Communication   Communication: No difficulties  Cognition Arousal/Alertness: Awake/alert Behavior During Therapy: Anxious;WFL for tasks assessed/performed Overall Cognitive Status: No family/caregiver present to determine baseline cognitive functioning                                 General Comments: Pt alert to name only. Unable to give date or year. Unable to name place but stated "this seems like a hospital". Unable to state why she was in the hospital. Pt appears to be aware of cognitive deficits. At one point asked "will I get this all back?" referring to her memory.       General Comments General comments (skin integrity, edema, etc.): No drainage noted (in dressings) JP drain L inferior abdomen beginning/end of session.  Nursing cleared pt for participation in physical therapy.  Pt agreeable to PT session.    Exercises    Assessment/Plan    PT Assessment Patient needs continued PT services  PT Problem List Decreased strength;Decreased activity tolerance;Decreased balance;Decreased mobility;Decreased cognition;Decreased knowledge of use of DME;Decreased safety awareness;Decreased knowledge of precautions;Pain;Cardiopulmonary status limiting activity;Decreased  skin integrity       PT Treatment Interventions DME instruction;Gait training;Functional mobility training;Therapeutic activities;Therapeutic exercise;Balance training;Patient/family education    PT Goals (Current goals can be found in the Care Plan section)  Acute Rehab PT Goals Patient Stated Goal: To get memory back PT Goal Formulation: With patient Time For Goal Achievement: 03/15/19 Potential to Achieve Goals: Fair    Frequency Min 2X/week   Barriers to discharge Decreased caregiver support      Co-evaluation PT/OT/SLP Co-Evaluation/Treatment: Yes Reason for Co-Treatment: For patient/therapist safety;Complexity of the patient's impairments (multi-system involvement);To address functional/ADL transfers PT goals addressed during session: Mobility/safety with mobility OT goals addressed during session: ADL's and self-care       AM-PAC PT "6 Clicks" Mobility  Outcome Measure Help needed turning from your back to your side while in a flat bed without using bedrails?: A Lot Help needed moving from lying on your back to sitting on the side of a flat bed without using bedrails?: Total Help needed moving to and from a bed to a chair (including a wheelchair)?: Total Help needed standing up from a chair using your arms (e.g., wheelchair or bedside chair)?: Total Help needed to  walk in hospital room?: Total Help needed climbing 3-5 steps with a railing? : Total 6 Click Score: 7    End of Session   Activity Tolerance: Patient tolerated treatment well Patient left: in bed;with call bell/phone within reach;with bed alarm set;Other (comment)(B heels elevated via pillow; B UE's elevated via pillows) Nurse Communication: Mobility status;Precautions;Other (comment)(pt's HR during session) PT Visit Diagnosis: Other abnormalities of gait and mobility (R26.89);Muscle weakness (generalized) (M62.81);Difficulty in walking, not elsewhere classified (R26.2)    Time: 7341-9379 PT Time  Calculation (min) (ACUTE ONLY): 40 min   Charges:   PT Evaluation $PT Eval Low Complexity: 1 Low         [place], PT 03/01/19, 4:39 PM [phone]

## 2019-03-01 NOTE — Progress Notes (Signed)
[facility] for Heparin  Indication: atrial fibrillation  Allergies  Allergen Reactions  . Latex     Itching Itching    Patient Measurements: Height: 5\' 6"  (167.6 cm) Weight: 213 lb 6.5 oz (96.8 kg) IBW/kg (Calculated) : 59.3 Heparin Dosing Weight: 82 kg   Vital Signs: Temp: 99 F (37.2 C) (03/02 0200) Temp Source: Axillary (03/02 0200) BP: 110/71 (03/02 0615) Pulse Rate: 99 (03/02 0615)  Labs: Recent Labs    02/27/19 0731 02/27/19 1928 02/28/19 0549 03/01/19 0455  HGB 11.9*  --  12.2  --   HCT 38.4  --  38.8  --   PLT 204  --  221  --   HEPARINUNFRC 0.47  --  0.65 0.69  CREATININE 0.63 0.68 0.62 0.52    Estimated Creatinine Clearance: 76.8 mL/min (by C-G formula based on SCr of 0.52 mg/dL).   Medical History: Past Medical History:  Diagnosis Date  . Barretts esophagus   . Chest pain    a. 02/2014 Myoview: Ef 50%, no ischemia.  . Colon polyps   . COPD (chronic obstructive pulmonary disease) ([place])   . Depression with anxiety   . GERD (gastroesophageal reflux disease)   . Neuropathy   . Permanent atrial fibrillation    a. s/p ablation 01/2012 in [facility] by Dr. Boyd Kerbs;  b. On sotalol & Xarelto;  c. 02/2014 Echo: EF 50-55%, mild conc LVH, nl LA size/structure;  d. Recurrent afib 8/15 & 08/29/2014.  [name] Rectal fistula   . Rheumatoid arthritis ([place])    On methotrexate and orencia  . Urinary incontinence   . Vitamin D deficiency     Medications:  Medications Prior to Admission  Medication Sig Dispense Refill Last Dose  . ELIQUIS 5 MG TABS tablet Take 1 tablet (5 mg total) by mouth 2 (two) times daily. 180 tablet 3 02/18/2019 at 0900  . esomeprazole (NEXIUM) 20 MG capsule Take 20 mg by mouth daily at 12 noon.   02/18/2019 at 1200  . Fluticasone-Umeclidin-Vilant (TRELEGY ELLIPTA) 100-62.5-25 MCG/INH AEPB Inhale 1 puff into the lungs daily. 60 each 2 02/19/2019 at 0530  . gabapentin (NEURONTIN) 300 MG capsule Take 1 capsule (300  mg total) by mouth 3 (three) times daily. 90 capsule 3 02/18/2019 at 2000  . HUMIRA PEN 40 MG/0.4ML PNKT Inject 40 mg as directed. Every 2 weeks   Past Month at Unknown time  . hydroxychloroquine (PLAQUENIL) 200 MG tablet Take 200 mg by mouth 2 (two) times daily.    02/18/2019 at 2000  . propranolol ER (INDERAL LA) 80 MG 24 hr capsule Take 1 capsule (80 mg total) by mouth daily. 90 capsule 3 02/18/2019 at 0800  . rosuvastatin (CRESTOR) 10 MG tablet Take 1 tablet (10 mg total) by mouth daily. 90 tablet 3 02/18/2019 at 2000  . albuterol (PROVENTIL HFA;VENTOLIN HFA) 108 (90 Base) MCG/ACT inhaler Inhale 1-2 puffs into the lungs every 4 (four) hours as needed for wheezing or shortness of breath. 1 Inhaler 10 Unknown at prn  . furosemide (LASIX) 20 MG tablet Take 1 tablet (20 mg total) by mouth daily as needed. 90 tablet 2 Unknown at prn  . ibuprofen (ADVIL,MOTRIN) 400 MG tablet Take 400 mg by mouth every 4 (four) hours as needed.    Unknown at prn  . lidocaine (XYLOCAINE) 5 % ointment Apply 1 application topically as needed.   Unknown at prn  . potassium chloride (K-DUR) 10 MEQ tablet Take 1 tablet (10 mEq total)  by mouth daily as needed. 90 tablet 2 Unknown at prn  . traMADol (ULTRAM) 50 MG tablet Take 1 tablet (50 mg total) by mouth every 12 (twelve) hours as needed for severe pain. 30 tablet 0 Unknown at prn    Assessment: Pharmacy consulted for heparin drip management for 73 year old female with permanent atrial fibrillation and chronic diastolic heart failure followed by Dr. Rockey Situ who presented to the emergency room due to abdominal pain and found to have acute appendicitis and atrial fibrillation with RVR. Patient previously on apixaban PTA with last dose 02/20 in the evening.   Goal of Therapy: Heparin level 0.3-0.7 units/ml Monitor platelets by anticoagulation protocol: Yes  Plan:  03/02 @ 0500 HL 0.69 therapeutic, but borderline. Will decrease heparin to 1000 units/hr will recheck HL @ 1500  CBC stable will continue to monitor.  Tobie Lords, PharmD, BCPS Clinical Pharmacist 03/01/2019

## 2019-03-01 NOTE — Progress Notes (Signed)
Nutrition Follow Up Note   DOCUMENTATION CODES:   Obesity unspecified  INTERVENTION:   Once NGT placed:   Osmolite 1.5 @ goal rate of 15m/hr- Initiate at 333mhr and increase by 1077mr q 8 hours as tolerated until goal rate is reached  Prostat liquid protein 30 ml BID via tube, each supplement provides 100 kcal, 15 grams protein.  Free water 67m63m4 hours for tube patency   Regimen provides 2000kcal/day, 105g/day, 1094ml10m of free water   Pt at high refeed risk; recommend monitor K, Mg and P labs daily until stable  NUTRITION DIAGNOSIS:   Inadequate oral intake related to inability to eat as evidenced by NPO status - pt advanced to dysphagia 1 diet but continues to have poor po intake   GOAL:   Patient will meet greater than or equal to 90% of their needs  -not met   MONITOR:   Labs, Weight trends, TF tolerance, Skin, I & O's  ASSESSMENT:   72 y.20 female with acute perforated appendicitis s/p laparoscopic converted to open appendectomy 2/21,2/25plicated by pertinent comorbidities including atrial fibrillation on anticoagulation, COPD, rheumatoid arthritis.   Pt advanced to dysphagia 1 diet 2/29. Pt continues to have poor appetite and oral intake. Pt refusing to eat. Plan is to attempt IR NGT placement today and tube feeds. Pt at high refeed risk; recommend monitor K, Mg and P labs daily. Per chart, pt with 7lb(3%) wt loss since admit.   Medications reviewed and include: precedex, pepcid, heparin, zosyn  Labs reviewed: K 3.3(L), P 3.5 wnl, Mg 1.9 wnl  Diet Order:   Diet Order            DIET - DYS 1 Room service appropriate? Yes; Fluid consistency: Thin  Diet effective now             EDUCATION NEEDS:   No education needs have been identified at this time  Skin:  Skin Assessment: Reviewed RN Assessment(incision abdomen )  Last BM:  3/2- type 6  Height:   Ht Readings from Last 1 Encounters:  02/22/19 '5\' 6"'$  (1.676 m)    Weight:   Wt Readings  from Last 1 Encounters:  02/28/19 96.8 kg    Ideal Body Weght:  59 kg  BMI:  Body mass index is 34.44 kg/m.  Estimated Nutritional Needs:   Kcal:  1800-2100kcal/day   Protein:  100-110g/day   Fluid:  1.5L/day   CaseyKoleen DistanceRD, LDN Pager #- [phone]#- [phone] Hours Pager: [phone]

## 2019-03-01 NOTE — Progress Notes (Signed)
PT Cancellation Note  Patient Details Name: Brandy Armstrong MRN: [id number] DOB: 1946/03/20   Cancelled Treatment:    Reason Eval/Treat Not Completed: Other (comment).  PT consult received.  Chart reviewed.  Nurse cleared pt for participation in PT.  Pt reporting needing "clean-up"; nurse notified.  Will re-attempt PT evaluation at a later date/time.  Leitha Bleak, PT 03/01/19, 11:17 AM [phone]

## 2019-03-01 NOTE — Progress Notes (Signed)
[facility] [place] NAME: Brandy Armstrong    MR#:  [id number]  DATE OF BIRTH:  [birth year]  SUBJECTIVE:  CHIEF COMPLAINT:   Chief Complaint  Patient presents with  . Abdominal Pain  alert, remains tachycardic, having more stools, just got PICC line REVIEW OF SYSTEMS:  Review of Systems  Constitutional: Negative for chills and fever.  HENT: Negative for hearing loss and tinnitus.   Eyes: Negative for blurred vision and double vision.  Respiratory: Negative for cough, sputum production and shortness of breath.   Cardiovascular: Negative for chest pain and palpitations.  Gastrointestinal: Positive for diarrhea. Negative for abdominal pain, heartburn, nausea and vomiting.  Genitourinary: Negative for dysuria and urgency.  Musculoskeletal: Negative for myalgias and neck pain.  Skin: Negative for itching and rash.  Neurological: Negative for dizziness and headaches.  Psychiatric/Behavioral: Negative for depression and hallucinations.    DRUG ALLERGIES:   Allergies  Allergen Reactions  . Latex     Itching Itching   VITALS:  Blood pressure 138/68, pulse (!) 113, temperature 98.3 F (36.8 C), resp. rate (!) 21, height 5\' 6"  (1.676 m), weight 96.8 kg, SpO2 94 %. PHYSICAL EXAMINATION:  Physical Exam  Constitutional: She appears well-developed and well-nourished.  HENT:  Head: Normocephalic and atraumatic.  Eyes: Pupils are equal, round, and reactive to light. Conjunctivae and EOM are normal.  Neck: Normal range of motion. Neck supple. No tracheal deviation present. No thyromegaly present.  Cardiovascular: Regular rhythm. Tachycardia present.  Respiratory: Effort normal and breath sounds normal.  GI: Soft. Bowel sounds are normal. There is no abdominal tenderness.  Musculoskeletal: Normal range of motion.        General: No edema.  Neurological: She is alert.  Patient   Skin: Skin is warm. No erythema.  Psychiatric: She has a normal  mood and affect. Her behavior is normal.   LABORATORY PANEL:  Female CBC Recent Labs  Lab 02/28/19 0549  WBC 8.1  HGB 12.2  HCT 38.8  PLT 221   ------------------------------------------------------------------------------------------------------------------ Chemistries  Recent Labs  Lab 03/01/19 0455  NA 136  K 3.3*  CL 105  CO2 23  GLUCOSE 118*  BUN 7*  CREATININE 0.52  CALCIUM 8.3*  MG 1.9   RADIOLOGY:  Dg Chest Port 1 View  Result Date: 03/01/2019 CLINICAL DATA:  PICC placement EXAM: PORTABLE CHEST 1 VIEW COMPARISON:  02/22/2019 chest radiograph. FINDINGS: Right PICC terminates at the cavoatrial junction. Stable cardiomediastinal silhouette with mild cardiomegaly. No pneumothorax. No pleural effusion. No overt pulmonary edema. Decreased left retrocardiac opacity. IMPRESSION: 1. Right PICC terminates at the cavoatrial junction. 2. Stable mild cardiomegaly without overt pulmonary edema. 3. Decreased left retrocardiac opacity, favor decreasing atelectasis. Electronically Signed   By: Ilona Sorrel M.D.   On: 03/01/2019 15:41   [place] Ekg Site Rite  Result Date: 03/01/2019 If Site Rite image not attached, placement could not be confirmed due to current cardiac rhythm.  ASSESSMENT AND PLAN:  73 year old female with permanent atrial fibrillation and chronic diastolic heart failure followed by Dr. Rockey Situ who presented to the emergency room due to abdominal pain and found to have acute appendicitis and atrial fibrillation with RVR.  1. Permanent Atrial fibrillation with RVR:  - Cardio started oral metoprolol 25 mg every 6 hours. - also on Eliquis now. May need cardioversion  2.  Acute perforated appendicitis:  - status post surgery on 02/19/2019.   Being managed by surgical team.   3 COPD  without signs of exacerbation  4.  Rheumatoid arthritis: Continue outpatient regimen  5.  Hypokalemia: Replaced  6.  Acute metabolic encephalopathy. - MRI of the brain done with  atrophy and chronic ischemic changes without acute intracranial abnormalities. Being managed by critical care team.  DVT prophylaxis; patient currently on eliquis    All the records are reviewed and case discussed with Care Management/Social Worker. Management plans discussed with the patient, nursing and they are in agreement.  CODE STATUS: Full Code  TOTAL TIME TAKING CARE OF THIS PATIENT: 15 minutes.   More than 50% of the time was spent in counseling/coordination of care: YES  POSSIBLE D/C IN 2-3 DAYS, DEPENDING ON CLINICAL CONDITION.   Max Sane M.D on 03/01/2019 at 7:46 PM  Between 7am to 6pm - Pager - [phone]  After 6pm go to [email or web address] - Proofreader  [facility]  Office  [phone]  CC: Primary care physician; [name], FNP  Note: This dictation was prepared with Dragon dictation along with smaller phrase technology. Any transcriptional errors that result from this process are unintentional.

## 2019-03-01 NOTE — Progress Notes (Signed)
[facility] for Heparin  Indication: atrial fibrillation  Allergies  Allergen Reactions  . Latex     Itching Itching    Patient Measurements: Height: 5\' 6"  (167.6 cm) Weight: 213 lb 6.5 oz (96.8 kg) IBW/kg (Calculated) : 59.3 Heparin Dosing Weight: 82 kg   Vital Signs: Temp: 98.3 F (36.8 C) (03/02 1600) BP: 128/80 (03/02 1600) Pulse Rate: 109 (03/02 1600)  Labs: Recent Labs    02/27/19 0731 02/27/19 1928 02/28/19 0549 03/01/19 0455 03/01/19 1447  HGB 11.9*  --  12.2  --   --   HCT 38.4  --  38.8  --   --   PLT 204  --  221  --   --   HEPARINUNFRC 0.47  --  0.65 0.69 0.70  CREATININE 0.63 0.68 0.62 0.52  --     Estimated Creatinine Clearance: 76.8 mL/min (by C-G formula based on SCr of 0.52 mg/dL).   Medical History: Past Medical History:  Diagnosis Date  . Barretts esophagus   . Chest pain    a. 02/2014 Myoview: Ef 50%, no ischemia.  . Colon polyps   . COPD (chronic obstructive pulmonary disease) ([place])   . Depression with anxiety   . GERD (gastroesophageal reflux disease)   . Neuropathy   . Permanent atrial fibrillation    a. s/p ablation 01/2012 in [facility] by Dr. Boyd Kerbs;  b. On sotalol & Xarelto;  c. 02/2014 Echo: EF 50-55%, mild conc LVH, nl LA size/structure;  d. Recurrent afib 8/15 & 08/29/2014.  [name] Rectal fistula   . Rheumatoid arthritis ([place])    On methotrexate and orencia  . Urinary incontinence   . Vitamin D deficiency     Medications:  Medications Prior to Admission  Medication Sig Dispense Refill Last Dose  . ELIQUIS 5 MG TABS tablet Take 1 tablet (5 mg total) by mouth 2 (two) times daily. 180 tablet 3 02/18/2019 at 0900  . esomeprazole (NEXIUM) 20 MG capsule Take 20 mg by mouth daily at 12 noon.   02/18/2019 at 1200  . Fluticasone-Umeclidin-Vilant (TRELEGY ELLIPTA) 100-62.5-25 MCG/INH AEPB Inhale 1 puff into the lungs daily. 60 each 2 02/19/2019 at 0530  . gabapentin (NEURONTIN) 300 MG capsule Take 1 capsule  (300 mg total) by mouth 3 (three) times daily. 90 capsule 3 02/18/2019 at 2000  . HUMIRA PEN 40 MG/0.4ML PNKT Inject 40 mg as directed. Every 2 weeks   Past Month at Unknown time  . hydroxychloroquine (PLAQUENIL) 200 MG tablet Take 200 mg by mouth 2 (two) times daily.    02/18/2019 at 2000  . propranolol ER (INDERAL LA) 80 MG 24 hr capsule Take 1 capsule (80 mg total) by mouth daily. 90 capsule 3 02/18/2019 at 0800  . rosuvastatin (CRESTOR) 10 MG tablet Take 1 tablet (10 mg total) by mouth daily. 90 tablet 3 02/18/2019 at 2000  . albuterol (PROVENTIL HFA;VENTOLIN HFA) 108 (90 Base) MCG/ACT inhaler Inhale 1-2 puffs into the lungs every 4 (four) hours as needed for wheezing or shortness of breath. 1 Inhaler 10 Unknown at prn  . furosemide (LASIX) 20 MG tablet Take 1 tablet (20 mg total) by mouth daily as needed. 90 tablet 2 Unknown at prn  . ibuprofen (ADVIL,MOTRIN) 400 MG tablet Take 400 mg by mouth every 4 (four) hours as needed.    Unknown at prn  . lidocaine (XYLOCAINE) 5 % ointment Apply 1 application topically as needed.   Unknown at prn  . potassium chloride (  K-DUR) 10 MEQ tablet Take 1 tablet (10 mEq total) by mouth daily as needed. 90 tablet 2 Unknown at prn  . traMADol (ULTRAM) 50 MG tablet Take 1 tablet (50 mg total) by mouth every 12 (twelve) hours as needed for severe pain. 30 tablet 0 Unknown at prn    Assessment: Pharmacy consulted for heparin drip management for 73 year old female with permanent atrial fibrillation and chronic diastolic heart failure followed by Dr. Rockey Situ who presented to the emergency room due to abdominal pain and found to have acute appendicitis and atrial fibrillation with RVR. Patient previously on apixaban PTA with last dose 02/20 in the evening.   Goal of Therapy: Heparin level 0.3-0.7 units/ml Monitor platelets by anticoagulation protocol: Yes  Plan:  Continue heparin 1000 units/hr. Dose reduced this am with no appreciable change in anti-Xa value. Will  obtain follow up anti-Xa level at 2300.   Patient to have NG tube placement per IR. Per AM ICU rounds will initiate apixban after NG tube placement.   Pharmacy will continue to monitor and adjust per consult.   MLS 03/01/2019

## 2019-03-01 NOTE — Progress Notes (Signed)
Patient ID: Brandy Armstrong, female   DOB: Dec 24, 1946, 73 y.o.   MRN: [id number] 73 y.o.femalewithchronic asthmatic bronchitis presented to [facility] ED with abdominal pain. CT of abdomen revealed acute appendicitis. Underwent laparoscopic appendectomy 2/21. Remained intubated postprocedure.   Postoperative Truman Hayward had atrial fibrillation with rapid ventricular response and delirium.  Admitted to the ICU for management.   S) she has been successfully extubated.  She is awake and alert in no distress.  Appears to be more oriented.  Taking p.o.'s markedly better.  She is now on amiodarone infusion.  Heart rate better controlled.  She voices no complaints.  Overall states "I feel better"  0) vitals and nursing notes have been reviewed.  GENERAL:NAD, no fevers, chills, no weakness no fatigue HEENT: . PERLA,No scleral icterus.  Moist mucosal membranes. [name]  NECK: Supple. No thyromegaly.  No JVD.  Trachea midline. PULMONARY: CTA B/L no wheezing, rhonchi, crackles CARDIOVASCULAR: S1 and S2. IR-Regular rate and rhythm. No murmurs GASTROINTESTINAL: Soft, nondistended. Positive bowel sounds.  Wound appears to be healing well. MUSCULOSKELETAL: No swelling, clubbing, or edema.  NEUROLOGIC: No focal deficits.  Still somewhat befuddled.Easily oriented. SKIN: Norashes, or cyanosis or clubbing.  PSYCHIATRIC: flat affect.   A/P  AFIB WITH RVR Follow cardiology recommendations. Currently on amiodarone infusion. Beta-blockers also being administered.  DELIRIUM DUE TO TOXIC METABOLIC ENCEPHALOPATHY Neurologic work-up negative. Slowly resolving.   Stepdown status  Multidisciplinary rounds were performed with ICU team.  No other new recommendations.

## 2019-03-01 NOTE — Progress Notes (Signed)
[facility] Day(s): 10.   Post op day(s): 10 Days Post-Op.   Interval History: Patient seen and examined. Patient able to tell her name, more talkative and alert. As per nurse, patient having multiple diarrhea. C. Diff negative.   Vital signs in last 24 hours: [min-max] current  Temp:  [98.1 F (36.7 C)-99 F (37.2 C)] 98.3 F (36.8 C) (03/02 1600) Pulse Rate:  [93-151] 113 (03/02 1800) Resp:  [14-36] 21 (03/02 1800) BP: (80-173)/(46-124) 138/68 (03/02 1800) SpO2:  [94 %-100 %] 96 % (03/02 1952)     Height: 5\' 6"  (167.6 cm) Weight: 96.8 kg BMI (Calculated): 34.46   Physical Exam:  Constitutional: alert, cooperative and no distress  Respiratory: breathing non-labored at rest  Cardiovascular: irregular rate and rythm  Gastrointestinal: soft, non-tender, and non-distended. Wound is dry and clean.   Labs:  CBC Latest Ref Rng & Units 02/28/2019 02/27/2019 02/26/2019  WBC 4.0 - 10.5 K/uL 8.1 8.3 7.5  Hemoglobin 12.0 - 15.0 g/dL 12.2 11.9(L) 11.0(L)  Hematocrit 36.0 - 46.0 % 38.8 38.4 35.2(L)  Platelets 150 - 400 K/uL 221 204 199   CMP Latest Ref Rng & Units 03/01/2019 02/28/2019 02/27/2019  Glucose 70 - 99 mg/dL 118(H) 103(H) 101(H)  BUN 8 - 23 mg/dL 7(L) 8 8  Creatinine 0.44 - 1.00 mg/dL 0.52 0.62 0.68  Sodium 135 - 145 mmol/L 136 133(L) 137  Potassium 3.5 - 5.1 mmol/L 3.3(L) 3.4(L) 3.0(L)  Chloride 98 - 111 mmol/L 105 99 100  CO2 22 - 32 mmol/L 23 25 26   Calcium 8.9 - 10.3 mg/dL 8.3(L) 8.7(L) 8.4(L)  Total Protein 6.5 - 8.1 g/dL - - -  Total Bilirubin 0.3 - 1.2 mg/dL - - -  Alkaline Phos 38 - 126 U/L - - -  AST 15 - 41 U/L - - -  ALT 0 - 44 U/L - - -    Imaging studies: No new pertinent imaging studies   Assessment/Plan:  73 y.o.femalewith acute perforated appendicitis10Day Post-Ops/p laparoscopic converted to open appendectomy, complicated by pertinent comorbidities includingatrial fibrillation on anticoagulation, COPD, rheumatoid  arthritis. Patient recovering slowly. There is significant improvement of the encephalopathy. Patient more alert and talkative remembering more thins. Oriented in person. Still with difficulty controlling heart rate. Cardiology made changes on medications. No respiratory distress. Tolerating diet. As patient get more alert and stronger, more solid diet may be considered. Septic shock resolved. Adequate urine outtput. Appreciate Critical care team for the management of her encephalopathy, septic shock, respiratory failure and medical conditions. [facility] for the management of her a fib. Patient currently on therapeutic anticoagulation. Agree with starting physical therapy. Most likely patient will need placement on physical therapy facility when discharged.    Arnold Long, MD

## 2019-03-01 NOTE — Progress Notes (Signed)
Progress Note  Patient Name: Brandy Armstrong Date of Encounter: 03/01/2019  Primary Cardiologist: Ida Rogue, MD   Subjective   She continues to be in A. fib with RVR.  She developed hypotension over the weekend and Dr. Caryl Comes discontinued esmolol drip and switch to IV amiodarone. She is tolerating pured diet.   Inpatient Medications    Scheduled Meds: . budesonide (PULMICORT) nebulizer solution  0.25 mg Nebulization Q12H  . famotidine  20 mg Oral BID  . furosemide  40 mg Intravenous Daily  . ipratropium  0.5 mg Nebulization BID  . levalbuterol  0.63 mg Nebulization BID  . metoprolol tartrate  25 mg Oral Q6H  . potassium chloride  40 mEq Oral BID   Continuous Infusions: . amiodarone 30 mg/hr (03/01/19 0616)  . dextrose 5 % and 0.9% NaCl 50 mL/hr at 03/01/19 0550  . heparin 1,000 Units/hr (03/01/19 0703)   PRN Meds: acetaminophen **OR** acetaminophen, fentaNYL (SUBLIMAZE) injection, HYDROcodone-acetaminophen, metoprolol tartrate, ondansetron **OR** ondansetron (ZOFRAN) IV   Vital Signs    Vitals:   03/01/19 0545 03/01/19 0600 03/01/19 0615 03/01/19 0738  BP: 121/82 (!) 92/53 110/71   Pulse: (!) 109 96 99   Resp: 16 (!) 23 20   Temp:      TempSrc:      SpO2: 100% 94% 96% 95%  Weight:      Height:        Intake/Output Summary (Last 24 hours) at 03/01/2019 0925 Last data filed at 03/01/2019 0600 Gross per 24 hour  Intake 2397.3 ml  Output 925 ml  Net 1472.3 ml   Last 3 Weights 02/28/2019 02/22/2019 02/10/2019  Weight (lbs) 213 lb 6.5 oz 220 lb 14.4 oz 221 lb 3.2 oz  Weight (kg) 96.8 kg 100.2 kg 100.336 kg      Telemetry    Atrial fibrillation with rapid ventricular response.  Heart rate is ranging between 110 to 140 bpm- Personally Reviewed  ECG     - Personally Reviewed  Physical Exam   GEN: No acute distress.   Neck: No JVD Cardiac:  Irregularly irregular and tachycardic, no murmurs, rubs, or gallops.  Respiratory: Clear to auscultation bilaterally  with diminished breath sounds at the base GI: Soft, nontender, non-distended  MS: No edema; No deformity. Neuro:  Nonfocal  Psych: Normal affect   Labs    Chemistry Recent Labs  Lab 02/27/19 1928 02/28/19 0549 03/01/19 0455  NA 137 133* 136  K 3.0* 3.4* 3.3*  CL 100 99 105  CO2 26 25 23   GLUCOSE 101* 103* 118*  BUN 8 8 7*  CREATININE 0.68 0.62 0.52  CALCIUM 8.4* 8.7* 8.3*  GFRNONAA >60 >60 >60  GFRAA >60 >60 >60  ANIONGAP 11 9 8      Hematology Recent Labs  Lab 02/26/19 0653 02/27/19 0731 02/28/19 0549  WBC 7.5 8.3 8.1  RBC 3.99 4.34 4.35  HGB 11.0* 11.9* 12.2  HCT 35.2* 38.4 38.8  MCV 88.2 88.5 89.2  MCH 27.6 27.4 28.0  MCHC 31.3 31.0 31.4  RDW 15.0 15.2 15.1  PLT 199 204 221    Cardiac EnzymesNo results for input(s): TROPONINI in the last 168 hours. No results for input(s): TROPIPOC in the last 168 hours.   BNPNo results for input(s): BNP, PROBNP in the last 168 hours.   DDimer No results for input(s): DDIMER in the last 168 hours.   Radiology    No results found.  Cardiac Studies   2D Echocardiogram 2.22.2020  1.  The left ventricle has hyperdynamic systolic function, with an ejection fraction of >65%. The cavity size was normal. There is mildly increased left ventricular wall thickness. Left ventricular diastology could not be evaluated secondary to atrial  fibrillation. 2. The right ventricle has normal systolic function. The cavity was normal. There is no increase in right ventricular wall thickness. 3. The mitral valve is normal in structure. 4. The tricuspid valve is normal in structure. 5. The aortic valve is normal in structure. Mild thickening of the aortic valve no stenosis of the aortic valve. 6. The interatrial septum was not assessed.  Patient Profile     73 y.o. female with a hx of permanent Afib on Eliquis s/p ablation in 2013 with recurrent Afib s/p repeat ablation in 01/2015 s/p DCCV in 02/2015 s/p DCCV in 08/2016 with recurrent  Afib 1 month later leading to the discontinuation of amiodarone and rate control strategy since, aortic atherosclerosis, chronic diastolic CHF, GI bleed on Xarelto, COPD secondary to tobacco abuse quitting in 2012, and anemia, who was admitted 2/21 with acute appendicitis and rapid afib. Now s/p open appendectomy.  Assessment & Plan    1.  Atrial fibrillation with rapid ventricular response: The patient has known history of permanent atrial fibrillation .  Rate control has been somewhat difficult although she has not started oral medications yet. Dr. Caryl Comes has recommended TEE guided cardioversion Tuesday or Wednesday if not able to control her rate.  She was switched from esmolol to amiodarone drip due to hypotension.  The patient had difficulty maintaining sinus rhythm in the past even with amiodarone and thus cardioversion should be left as a last resort. I am going to start oral metoprolol 25 mg every 6 hours. I am switching heparin drip to Eliquis.    2.  Acute appendicitis status post appendectomy on February 21.  3.  Chronic diastolic heart failure: She appears to be euvolemic.       For questions or updates, please contact [place] Please consult [email or web address] for contact info under        Signed, Kathlyn Sacramento, MD  03/01/2019, 9:25 AM

## 2019-03-01 NOTE — Progress Notes (Signed)
Peripherally Inserted Central Catheter/Midline Placement  The IV Nurse has discussed with the patient and/or persons authorized to consent for the patient, the purpose of this procedure and the potential benefits and risks involved with this procedure.  The benefits include less needle sticks, lab draws from the catheter, and the patient may be discharged home with the catheter. Risks include, but not limited to, infection, bleeding, blood clot (thrombus formation), and puncture of an artery; nerve damage and irregular heartbeat and possibility to perform a PICC exchange if needed/ordered by physician.  Alternatives to this procedure were also discussed.  Bard Power PICC patient education guide, fact sheet on infection prevention and patient information card has been provided to patient /or left at bedside.    PICC/Midline Placement Documentation  PICC Double Lumen 03/01/19 PICC Right Cephalic 39 cm 0 cm (Active)  Indication for Insertion or Continuance of Line Vasoactive infusions 03/01/2019  3:00 PM  Exposed Catheter (cm) 0 cm 03/01/2019  3:00 PM  Site Assessment Clean;Dry;Intact 03/01/2019  3:00 PM  Lumen #1 Status Flushed;Blood return noted;Saline locked 03/01/2019  3:00 PM  Lumen #2 Status Flushed;Blood return noted;Saline locked 03/01/2019  3:00 PM  Dressing Type Transparent;Securing device 03/01/2019  3:00 PM  Dressing Status Clean;Dry;Intact;Antimicrobial disc in place 03/01/2019  3:00 PM  Line Care Connections checked and tightened 03/01/2019  3:00 PM  Dressing Intervention New dressing 03/01/2019  3:00 PM  Dressing Change Due 03/08/19 03/01/2019  3:00 PM       Virgilio Belling 03/01/2019, 3:28 PM

## 2019-03-01 NOTE — Evaluation (Signed)
Occupational Therapy Evaluation Patient Details Name: Brandy Armstrong MRN: [id number] DOB: [birth year] Today's Date: 03/01/2019    History of Present Illness 73 y.o. female presented to [facility] ED with abdominal pain.  CT of abdomen revealed acute appendicitis.  Underwent laparoscopic which was converted to open appendectomy 2/21.  Remained intubated postprocedure until 2/22. Pt with significant confusion since surgery. MRI negative; Atrial fibrillation with RVR. PMH includes: COPD & RA.    Clinical Impression   [name] was seen for OT/PT co-evaluation this date. Pt was limited by cognitive status. Pt alert to name only at time of evaluation which RN indicated has been typical for this admission. Information on home set-up/PLOF obtained/verified through chart review. Prior to hospital admission, pt was generally independent with ADLS, living in a single level apartment home at "[facility]" independent living facility. Pt lives alone and cares for her pet dog. Pt family informed case mgmt that the pt was driving and a community ambulator prior to admission. Currently pt demonstrates impairments in cognition, activity tolerance, strength, & endurance. requiring +2 mod assist for ADLs at bed level or seated EOB. Pt limited by cardiopulmonary status at time of evaluation with HR reaching 120 with minimal exertion. ADLs not formally assessed. Pt would benefit from skilled OT to address noted impairments and functional limitations (see below for any additional details) in order to maximize safety and independence while minimizing falls risk and caregiver burden. Upon hospital discharge, recommend STR.     Follow Up Recommendations  SNF;Supervision/Assistance - 24 hour    Equipment Recommendations  (TBD)    Recommendations for Other Services       Precautions / Restrictions Precautions Precautions: Fall Restrictions Weight Bearing Restrictions: No      Mobility Bed Mobility Overal bed mobility:  Needs Assistance Bed Mobility: Supine to Sit;Sit to Supine     Supine to sit: +2 for safety/equipment;+2 for physical assistance;Mod assist Sit to supine: +2 for safety/equipment;+2 for physical assistance;Mod assist      Transfers                 General transfer comment: Deferred for pt safety. Pt on heparin drip and with HR reaching 120's with minimal exertion.     Balance Overall balance assessment: Needs assistance Sitting-balance support: Bilateral upper extremity supported;Feet supported Sitting balance-Leahy Scale: Poor Sitting balance - Comments: Pt required support from therapist in order to maintain sitting balance EOB.                                    ADL either performed or assessed with clinical judgement   ADL Overall ADL's : Needs assistance/impaired Eating/Feeding: Set up;Moderate assistance;Sitting Eating/Feeding Details (indicate cue type and reason): Pt having difficulty with self-feeding, currently on pureed diet. RN indicated she is not eating much.  Grooming: Set up;Sitting;Moderate assistance;Cueing for sequencing   Upper Body Bathing: Set up;Maximal assistance;Bed level   Lower Body Bathing: Set up;Maximal assistance;Bed level   Upper Body Dressing : Set up;Sitting;Moderate assistance;Cueing for sequencing   Lower Body Dressing: Maximal assistance;Bed level;Set up                 General ADL Comments: Pt req mod assist +2 to come EOB on this date. Is very limited by cognitive status had difficulty following VCs.      Vision Baseline Vision/History: Wears glasses Wears Glasses: Reading only Patient Visual Report: No change from  baseline       Perception     Praxis      Pertinent Vitals/Pain Pain Assessment: Faces Faces Pain Scale: Hurts little more Pain Location: Pt denies pain at rest. While seated EOB complained of pain in groin, suspect catheter site. Pt also endorsed pain in R hand at IV site.  Pain  Descriptors / Indicators: Grimacing;Guarding Pain Intervention(s): Limited activity within patient's tolerance;Monitored during session;Repositioned     Hand Dominance Right   Extremity/Trunk Assessment Upper Extremity Assessment Upper Extremity Assessment: Generalized weakness(Pt with heparin drip at time of eval. UE assessment limited. Pt grossly 3-/5 BUE )   Lower Extremity Assessment Lower Extremity Assessment: Defer to PT evaluation;Generalized weakness       Communication Communication Communication: No difficulties   Cognition Arousal/Alertness: Awake/alert Behavior During Therapy: Anxious;WFL for tasks assessed/performed Overall Cognitive Status: No family/caregiver present to determine baseline cognitive functioning                                 General Comments: Pt alert to name only. Unable to give date or year. Unable to name place but stated "this seems like a hospital". Unable to state why she was in the hospital. Pt appears to be aware of cognitive deficits. At one point asked "will I get this all back?" referring to her memory.    General Comments       Exercises Other Exercises Other Exercises: Pt instructed in bed mobility technique on this date. Pt able to complete sup<>sit transfer with therapist assistance +2.  Other Exercises: Pt educated in use of PLB with activity. Pt able to return demonstrate with modeling and VCs provided.    Shoulder Instructions      Home Living Family/patient expects to be discharged to:: Other (Comment)(ILF - [facility] RN verified. ) Living Arrangements: Alone Available Help at Discharge: Family;Available PRN/intermittently Type of Home: Apartment Home Access: Level entry     Home Layout: One level     Bathroom Shower/Tub: Occupational psychologist: Standard     Home Equipment: Environmental consultant - 2 wheels;Grab bars - toilet;Hand held shower head;Grab bars - tub/shower   Additional Comments: No family  available at time of evaluation to verify home set-up.       Prior Functioning/Environment Level of Independence: Independent        Comments: Per case mgmt note 2/27 family confirms pt lives alone and was independent at baseline, driving, community ambulator.         OT Problem List: Decreased strength;Impaired balance (sitting and/or standing);Decreased cognition;Decreased knowledge of precautions;Decreased range of motion;Decreased safety awareness;Cardiopulmonary status limiting activity;Decreased activity tolerance;Decreased coordination;Decreased knowledge of use of DME or AE;Impaired UE functional use;Obesity      OT Treatment/Interventions: Self-care/ADL training;Therapeutic exercise;Patient/family education;Therapeutic activities;Energy conservation;DME and/or AE instruction;Cognitive remediation/compensation;Balance training    OT Goals(Current goals can be found in the care plan section) Acute Rehab OT Goals Patient Stated Goal: To get memory back OT Goal Formulation: With patient Time For Goal Achievement: 03/15/19 Potential to Achieve Goals: Good ADL Goals Pt Will Perform Eating: with set-up;sitting;with adaptive utensils(With LRAD for safety and improved functional independence.) Pt Will Perform Grooming: with set-up;with supervision;sitting(With LRAD for safety and improved functional independence.) Pt Will Perform Upper Body Dressing: sitting;with min assist;with adaptive equipment(With LRAD for safety and improved functional independence.) Pt Will Perform Lower Body Dressing: with adaptive equipment;with mod assist;sit to/from stand(With LRAD for safety and  improved functional independence.) Pt Will Transfer to Toilet: bedside commode;stand pivot transfer;with mod assist(With LRAD/DME for safety and improved functional independence.)  OT Frequency: Min 1X/week   Barriers to D/C: Decreased caregiver support;Inaccessible home environment          Co-evaluation  PT/OT/SLP Co-Evaluation/Treatment: Yes Reason for Co-Treatment: For patient/therapist safety;Complexity of the patient's impairments (multi-system involvement);To address functional/ADL transfers   OT goals addressed during session: ADL's and self-care      AM-PAC OT "6 Clicks" Daily Activity     Outcome Measure Help from another person eating meals?: A Little Help from another person taking care of personal grooming?: A Little Help from another person toileting, which includes using toliet, bedpan, or urinal?: A Lot Help from another person bathing (including washing, rinsing, drying)?: Total Help from another person to put on and taking off regular upper body clothing?: A Lot Help from another person to put on and taking off regular lower body clothing?: A Lot 6 Click Score: 13   End of Session Nurse Communication: Mobility status;Other (comment)(pt cognitive status at time of eval)  Activity Tolerance: Patient tolerated treatment well;Treatment limited secondary to medical complications (Comment) Patient left: in bed;with bed alarm set;with call bell/phone within reach;Other (comment)(With staff in room to draw labs. )  OT Visit Diagnosis: Other abnormalities of gait and mobility (R26.89);Muscle weakness (generalized) (M62.81);Unsteadiness on feet (R26.81);Other symptoms and signs involving cognitive function                Time: 0205-0245 OT Time Calculation (min): 40 min Charges:  OT General Charges $OT Visit: 1 Visit OT Evaluation $OT Eval Moderate Complexity: 1 Mod  Shara Blazing, M.S., OTR/L Ascom: [phone] 03/01/19, 3:44 PM

## 2019-03-02 LAB — BASIC METABOLIC PANEL
Anion gap: 8 (ref 5–15)
BUN: 9 mg/dL (ref 8–23)
CO2: 24 mmol/L (ref 22–32)
Calcium: 8.9 mg/dL (ref 8.9–10.3)
Chloride: 105 mmol/L (ref 98–111)
Creatinine, Ser: 0.69 mg/dL (ref 0.44–1.00)
GFR calc Af Amer: >60 mL/min (ref >60)
GFR calc non Af Amer: >60 mL/min (ref >60)
Glucose, Bld: 111 mg/dL — ABNORMAL HIGH (ref 70–99)
Potassium: 4.1 mmol/L (ref 3.5–5.1)
Sodium: 137 mmol/L (ref 135–145)

## 2019-03-02 LAB — CBC
HCT: 38.9 % (ref 36.0–46.0)
Hemoglobin: 12 g/dL (ref 12.0–15.0)
MCH: 27.8 pg (ref 26.0–34.0)
MCHC: 30.8 g/dL (ref 30.0–36.0)
MCV: 90 fL (ref 80.0–100.0)
Platelets: 219 10*3/uL (ref 150–400)
RBC: 4.32 MIL/uL (ref 3.87–5.11)
RDW: 15.7 % — ABNORMAL HIGH (ref 11.5–15.5)
WBC: 6.4 10*3/uL (ref 4.0–10.5)
nRBC: 0 % (ref 0.0–0.2)

## 2019-03-02 LAB — GLUCOSE, CAPILLARY
Glucose-Capillary: 103 mg/dL — ABNORMAL HIGH (ref 70–99)
Glucose-Capillary: 97 mg/dL (ref 70–99)
Glucose-Capillary: 86 mg/dL (ref 70–99)
Glucose-Capillary: 76 mg/dL (ref 70–99)
Glucose-Capillary: 86 mg/dL (ref 70–99)

## 2019-03-02 LAB — HEPARIN LEVEL (UNFRACTIONATED)
Heparin Unfractionated: 0.68 IU/mL (ref 0.30–0.70)
Heparin Unfractionated: 0.51 IU/mL (ref 0.30–0.70)

## 2019-03-02 LAB — MAGNESIUM: MAGNESIUM: 1.9 mg/dL (ref 1.7–2.4)

## 2019-03-02 MED ORDER — DILTIAZEM HCL 30 MG PO TABS
30.0000 mg | ORAL_TABLET | Freq: Three times a day (TID) | ORAL | Status: DC
  Administered 2019-03-02 – 2019-03-03 (×3): 30 mg via ORAL
  Filled 2019-03-02 (×4): qty 1

## 2019-03-02 MED ORDER — DEXTROSE-NACL 5-0.9 % IV SOLN
INTRAVENOUS | Status: DC
  Administered 2019-03-02 – 2019-03-05 (×4): via INTRAVENOUS

## 2019-03-02 MED ORDER — HEPARIN (PORCINE) 25000 UT/250ML-% IV SOLN
1000.0000 [IU]/h | INTRAVENOUS | Status: DC
  Administered 2019-03-02: 1000 [IU]/h via INTRAVENOUS

## 2019-03-02 MED ORDER — APIXABAN 5 MG PO TABS
5.0000 mg | ORAL_TABLET | Freq: Two times a day (BID) | ORAL | Status: DC
  Administered 2019-03-02 – 2019-03-08 (×12): 5 mg via ORAL
  Filled 2019-03-02 (×12): qty 1

## 2019-03-02 MED ORDER — APIXABAN 5 MG PO TABS: 5.0000 mg | ORAL_TABLET | Freq: Two times a day (BID) | ORAL | Status: DC

## 2019-03-02 NOTE — Progress Notes (Addendum)
Progress Note  Patient Name: Brandy Armstrong Date of Encounter: 03/02/2019  Primary Cardiologist: Rockey Situ  Subjective   She remains in Afib with ventricular rates in the 90s to 110s bpm. Remains on heparin gtt, plan was to change to Eliquis on 3/2. Potassium repleted to goal of 4.1. Renal function normal. Magnesium 1.9. CBC stable.   She is unclear why she is in the hospital. No chest pain or SOB.   Inpatient Medications    Scheduled Meds: . budesonide (PULMICORT) nebulizer solution  0.25 mg Nebulization Q12H  . famotidine  20 mg Oral BID  . furosemide  40 mg Intravenous Daily  . ipratropium  0.5 mg Nebulization BID  . levalbuterol  0.63 mg Nebulization BID  . metoprolol tartrate  25 mg Oral Q6H  . sodium chloride flush  10-40 mL Intracatheter Q12H   Continuous Infusions: . amiodarone 30 mg/hr (03/02/19 0600)  . dextrose 5 % and 0.9 % NaCl with KCl 40 mEq/L 50 mL/hr at 03/02/19 0600  . heparin 1,000 Units/hr (03/02/19 0600)   PRN Meds: acetaminophen **OR** acetaminophen, fentaNYL (SUBLIMAZE) injection, HYDROcodone-acetaminophen, metoprolol tartrate, ondansetron **OR** ondansetron (ZOFRAN) IV, sodium chloride flush   Vital Signs    Vitals:   03/02/19 0459 03/02/19 0500 03/02/19 0600 03/02/19 0617  BP:  130/74  121/74  Pulse:  (!) 116 (!) 120 (!) 122  Resp:  18 20   Temp:      TempSrc:      SpO2:  95% 96%   Weight: 95.9 kg     Height:        Intake/Output Summary (Last 24 hours) at 03/02/2019 0743 Last data filed at 03/02/2019 0600 Gross per 24 hour  Intake 1357.98 ml  Output 2555 ml  Net -1197.02 ml   Filed Weights   02/22/19 1500 02/28/19 0500 03/02/19 0459  Weight: 100.2 kg 96.8 kg 95.9 kg    Telemetry    Afib, 90s to 110s bpm - Personally Reviewed  ECG    n/a - Personally Reviewed  Physical Exam   GEN: No acute distress.   Neck: No JVD. Cardiac: Irregularly irregular, no murmurs, rubs, or gallops.  Respiratory: Clear to auscultation bilaterally.   GI: Soft, nontender, non-distended.   MS: No edema; No deformity. Neuro:  Alert and oriented x 3; Nonfocal.  Psych: Normal affect.  Labs    Chemistry Recent Labs  Lab 02/28/19 0549 03/01/19 0455 03/02/19 0537  NA 133* 136 137  K 3.4* 3.3* 4.1  CL 99 105 105  CO2 25 23 24   GLUCOSE 103* 118* 111*  BUN 8 7* 9  CREATININE 0.62 0.52 0.69  CALCIUM 8.7* 8.3* 8.9  GFRNONAA >60 >60 >60  GFRAA >60 >60 >60  ANIONGAP 9 8 8      Hematology Recent Labs  Lab 02/27/19 0731 02/28/19 0549 03/02/19 0537  WBC 8.3 8.1 6.4  RBC 4.34 4.35 4.32  HGB 11.9* 12.2 12.0  HCT 38.4 38.8 38.9  MCV 88.5 89.2 90.0  MCH 27.4 28.0 27.8  MCHC 31.0 31.4 30.8  RDW 15.2 15.1 15.7*  PLT 204 221 219    Cardiac EnzymesNo results for input(s): TROPONINI in the last 168 hours. No results for input(s): TROPIPOC in the last 168 hours.   BNPNo results for input(s): BNP, PROBNP in the last 168 hours.   DDimer No results for input(s): DDIMER in the last 168 hours.   Radiology    Dg Chest Port 1 View  Result Date: 03/01/2019 IMPRESSION: 1.  Right PICC terminates at the cavoatrial junction. 2. Stable mild cardiomegaly without overt pulmonary edema. 3. Decreased left retrocardiac opacity, favor decreasing atelectasis. Electronically Signed   By: Ilona Sorrel M.D.   On: 03/01/2019 15:41    Cardiac Studies   2D Echocardiogram2.22.2020  1. The left ventricle has hyperdynamic systolic function, with an ejection fraction of >65%. The cavity size was normal. There is mildly increased left ventricular wall thickness. Left ventricular diastology could not be evaluated secondary to atrial  fibrillation. 2. The right ventricle has normal systolic function. The cavity was normal. There is no increase in right ventricular wall thickness. 3. The mitral valve is normal in structure. 4. The tricuspid valve is normal in structure. 5. The aortic valve is normal in structure. Mild thickening of the aortic valve no  stenosis of the aortic valve. 6. The interatrial septum was not assessed.  Patient Profile     73 y.o. female with history of permanent Afib on Eliquis s/p ablation in 2013 with recurrent Afib s/p repeat ablation in 01/2015 s/p DCCV in 02/2015 s/p DCCV in 08/2016 with recurrent Afib 1 month later leading to the discontinuation of amiodarone and rate control strategy since, aortic atherosclerosis, chronic diastolic CHF, GI bleed on Xarelto, COPD secondary to tobacco abuse quitting in 2012,andanemia, who was admitted 2/21 with acute appendicitis now s/p open appendectomy complicated by rapid Afib.  Assessment & Plan    1. Permanent Afib with RVR: -Remains in Afib with ventricular rates in the 90s to 110s bpm -Change heparin to Eliquis as previously recommended on 3/2, once NG tube is placed through IR -She was placed on an amiodarone gtt over the past weekend given hypotension with plan to consider DCCV later this week -The patient has had difficulty in maintaining sinus rhythm in the past as above, in this setting, DCCV should be only considered as a last resort -In this setting, it is reasonable to discontinue amiodarone gtt and place her on diltiazem 30 mg q 8 hours with continuation of metoprolol 25 mg q 6 hours -Hopefully, with the discontinuation of IV amiodarone, her BP will tolerate the above change  2. Acute appendicitis complicated by septic shock: -Status post open appendectomy -Shock resolved -Per surgery   3. HFpEF: -She does not appear volume up -Stop IV Lasix with transition to prn, this too should help with added BP room for escalation of rate control therapy   For questions or updates, please contact [place] Please consult [email or web address] for contact info under Cardiology/STEMI.    Signed, Christell Faith, PA-C [place] Pager: [phone] 03/02/2019, 7:43 AM

## 2019-03-02 NOTE — Progress Notes (Signed)
   03/02/19 1100  Clinical Encounter Type  Visited With Patient and family together  Visit Type Follow-up  Pt was alert and with a niece and a sister-in-law. Pt was in a pleasant mood. Ch was rounding and checked in on how the pt and the family were doing.

## 2019-03-02 NOTE — Progress Notes (Signed)
[facility] Hospital Day(s): 11.   Post op day(s): 11 Days Post-Op.   Interval History: Patient seen and examined, no acute events or new complaints overnight. Patient reports feeling better.  She is definitely getting to her baseline.  She is more talkative and starting to remember few things.  She also understand that she is in the hospital so now she is oriented in person and place.  Difficult to arrange time due to a week history of disorientation.  Vital signs in last 24 hours: [min-max] current  Temp:  [98 F (36.7 C)-99.1 F (37.3 C)] 98.2 F (36.8 C) (03/03 0800) Pulse Rate:  [98-132] 122 (03/03 0617) Resp:  [14-26] 20 (03/03 0600) BP: (98-138)/(58-113) 121/74 (03/03 0617) SpO2:  [93 %-99 %] 96 % (03/03 0600) Weight:  [95.9 kg] 95.9 kg (03/03 0459)     Height: 5\' 6"  (167.6 cm) Weight: 95.9 kg BMI (Calculated): 34.14   Physical Exam:  Constitutional: alert, cooperative and no distress  Respiratory: breathing non-labored at rest  Cardiovascular: regular rate and sinus rhythm  Gastrointestinal: soft, non-tender, and non-distended  Labs:  CBC Latest Ref Rng & Units 03/02/2019 02/28/2019 02/27/2019  WBC 4.0 - 10.5 K/uL 6.4 8.1 8.3  Hemoglobin 12.0 - 15.0 g/dL 12.0 12.2 11.9(L)  Hematocrit 36.0 - 46.0 % 38.9 38.8 38.4  Platelets 150 - 400 K/uL 219 221 204   CMP Latest Ref Rng & Units 03/02/2019 03/01/2019 02/28/2019  Glucose 70 - 99 mg/dL 111(H) 118(H) 103(H)  BUN 8 - 23 mg/dL 9 7(L) 8  Creatinine 0.44 - 1.00 mg/dL 0.69 0.52 0.62  Sodium 135 - 145 mmol/L 137 136 133(L)  Potassium 3.5 - 5.1 mmol/L 4.1 3.3(L) 3.4(L)  Chloride 98 - 111 mmol/L 105 105 99  CO2 22 - 32 mmol/L 24 23 25   Calcium 8.9 - 10.3 mg/dL 8.9 8.3(L) 8.7(L)  Total Protein 6.5 - 8.1 g/dL - - -  Total Bilirubin 0.3 - 1.2 mg/dL - - -  Alkaline Phos 38 - 126 U/L - - -  AST 15 - 41 U/L - - -  ALT 0 - 44 U/L - - -    Imaging studies: No new pertinent imaging studies   Assessment/Plan:  73  y.o.femalewith acute perforated appendicitis11Day Post-Ops/p laparoscopic converted to open appendectomy, complicated by pertinent comorbidities includingatrial fibrillation on anticoagulation, COPD, rheumatoid arthritis. Patient recovering slowly.  Today looks significantly more alert, talkative and with better energy.  From a standpoint, patient's diet can be advanced if she started tolerating more solid diet but if not, the dysphagia diet should be fine.  The wound is dry and clean.  Appreciate critical care team and cardiology with management of medical conditions.  Agree with physical therapy, contraindication from my standpoint to perform physical therapy.  Arnold Long, MD

## 2019-03-02 NOTE — Progress Notes (Signed)
Report called to Cory Roughen, RN on [place].

## 2019-03-02 NOTE — Progress Notes (Signed)
[place] for Heparin  Indication: atrial fibrillation  Allergies  Allergen Reactions  . Latex     Itching Itching    Patient Measurements: Height: 5\' 6"  (167.6 cm) Weight: 211 lb 6.7 oz (95.9 kg) IBW/kg (Calculated) : 59.3 Heparin Dosing Weight: 82 kg   Vital Signs: Temp: 98 F (36.7 C) (03/03 0400) Temp Source: Oral (03/03 0400) BP: 121/74 (03/03 0617) Pulse Rate: 122 (03/03 0617)  Labs: Recent Labs    02/27/19 0731  02/28/19 0549 03/01/19 0455 03/01/19 1447 03/01/19 2322 03/02/19 0537  HGB 11.9*  --  12.2  --   --   --  12.0  HCT 38.4  --  38.8  --   --   --  38.9  PLT 204  --  221  --   --   --  219  HEPARINUNFRC 0.47  --  0.65 0.69 0.70 0.68 0.51  CREATININE 0.63   < > 0.62 0.52  --   --  0.69   < > = values in this interval not displayed.    Estimated Creatinine Clearance: 76.3 mL/min (by C-G formula based on SCr of 0.69 mg/dL).   Medical History: Past Medical History:  Diagnosis Date  . Barretts esophagus   . Chest pain    a. 02/2014 Myoview: Ef 50%, no ischemia.  . Colon polyps   . COPD (chronic obstructive pulmonary disease) ([place])   . Depression with anxiety   . GERD (gastroesophageal reflux disease)   . Neuropathy   . Permanent atrial fibrillation    a. s/p ablation 01/2012 in [facility] by Dr. Boyd Kerbs;  b. On sotalol & Xarelto;  c. 02/2014 Echo: EF 50-55%, mild conc LVH, nl LA size/structure;  d. Recurrent afib 8/15 & 08/29/2014.  [name] Rectal fistula   . Rheumatoid arthritis ([place])    On methotrexate and orencia  . Urinary incontinence   . Vitamin D deficiency     Medications:  Medications Prior to Admission  Medication Sig Dispense Refill Last Dose  . ELIQUIS 5 MG TABS tablet Take 1 tablet (5 mg total) by mouth 2 (two) times daily. 180 tablet 3 02/18/2019 at 0900  . esomeprazole (NEXIUM) 20 MG capsule Take 20 mg by mouth daily at 12 noon.   02/18/2019 at 1200  . Fluticasone-Umeclidin-Vilant (TRELEGY ELLIPTA)  100-62.5-25 MCG/INH AEPB Inhale 1 puff into the lungs daily. 60 each 2 02/19/2019 at 0530  . gabapentin (NEURONTIN) 300 MG capsule Take 1 capsule (300 mg total) by mouth 3 (three) times daily. 90 capsule 3 02/18/2019 at 2000  . HUMIRA PEN 40 MG/0.4ML PNKT Inject 40 mg as directed. Every 2 weeks   Past Month at Unknown time  . hydroxychloroquine (PLAQUENIL) 200 MG tablet Take 200 mg by mouth 2 (two) times daily.    02/18/2019 at 2000  . propranolol ER (INDERAL LA) 80 MG 24 hr capsule Take 1 capsule (80 mg total) by mouth daily. 90 capsule 3 02/18/2019 at 0800  . rosuvastatin (CRESTOR) 10 MG tablet Take 1 tablet (10 mg total) by mouth daily. 90 tablet 3 02/18/2019 at 2000  . albuterol (PROVENTIL HFA;VENTOLIN HFA) 108 (90 Base) MCG/ACT inhaler Inhale 1-2 puffs into the lungs every 4 (four) hours as needed for wheezing or shortness of breath. 1 Inhaler 10 Unknown at prn  . furosemide (LASIX) 20 MG tablet Take 1 tablet (20 mg total) by mouth daily as needed. 90 tablet 2 Unknown at prn  . ibuprofen (ADVIL,MOTRIN) 400 MG  tablet Take 400 mg by mouth every 4 (four) hours as needed.    Unknown at prn  . lidocaine (XYLOCAINE) 5 % ointment Apply 1 application topically as needed.   Unknown at prn  . potassium chloride (K-DUR) 10 MEQ tablet Take 1 tablet (10 mEq total) by mouth daily as needed. 90 tablet 2 Unknown at prn  . traMADol (ULTRAM) 50 MG tablet Take 1 tablet (50 mg total) by mouth every 12 (twelve) hours as needed for severe pain. 30 tablet 0 Unknown at prn    Assessment: Pharmacy consulted for heparin drip management for 73 year old female with permanent atrial fibrillation and chronic diastolic heart failure followed by Dr. Rockey Situ who presented to the emergency room due to abdominal pain and found to have acute appendicitis and atrial fibrillation with RVR. Patient previously on apixaban PTA with last dose 02/20 in the evening.   Goal of Therapy: Heparin level 0.3-0.7 units/ml Monitor platelets by  anticoagulation protocol: Yes  Plan:  Continue heparin 1000 units/hr. Dose reduced this am with no appreciable change in anti-Xa value. Will obtain follow up anti-Xa level at 2300.   Patient to have NG tube placement per IR. Per AM ICU rounds will initiate apixban after NG tube placement.   3/2 PM heparin level 0.68. Continue current regimen. Recheck heparin level and CBC with AM labs.  3/3 AM heparin level 0.51. Continue current regimen. Recheck heparin level and CBC with tomorrow AM labs.  Pharmacy will continue to monitor and adjust per consult.   MLS 03/02/2019

## 2019-03-02 NOTE — Progress Notes (Signed)
Brandy Armstrong for Heparin  Indication: atrial fibrillation  Allergies  Allergen Reactions  . Latex     Itching Itching    Patient Measurements: Height: 5\' 6"  (167.6 cm) Weight: 213 lb 6.5 oz (96.8 kg) IBW/kg (Calculated) : 59.3 Heparin Dosing Weight: 82 kg   Vital Signs: Temp: 98.9 F (37.2 C) (03/02 2000) Temp Source: Axillary (03/02 2000) BP: 104/58 (03/02 2200) Pulse Rate: 132 (03/02 2200)  Labs: Recent Labs    02/27/19 0731 02/27/19 1928 02/28/19 0549 03/01/19 0455 03/01/19 1447 03/01/19 2322  HGB 11.9*  --  12.2  --   --   --   HCT 38.4  --  38.8  --   --   --   PLT 204  --  221  --   --   --   HEPARINUNFRC 0.47  --  0.65 0.69 0.70 0.68  CREATININE 0.63 0.68 0.62 0.52  --   --     Estimated Creatinine Clearance: 76.8 mL/min (by C-G formula based on SCr of 0.52 mg/dL).   Medical History: Past Medical History:  Diagnosis Date  . Barretts esophagus   . Chest pain    a. 02/2014 Myoview: Ef 50%, no ischemia.  . Colon polyps   . COPD (chronic obstructive pulmonary disease) ([place])   . Depression with anxiety   . GERD (gastroesophageal reflux disease)   . Neuropathy   . Permanent atrial fibrillation    a. s/p ablation 01/2012 in [facility] by Dr. Boyd Kerbs;  b. On sotalol & Xarelto;  c. 02/2014 Echo: EF 50-55%, mild conc LVH, nl LA size/structure;  d. Recurrent afib 8/15 & 08/29/2014.  [name] Rectal fistula   . Rheumatoid arthritis ([place])    On methotrexate and orencia  . Urinary incontinence   . Vitamin D deficiency     Medications:  Medications Prior to Admission  Medication Sig Dispense Refill Last Dose  . ELIQUIS 5 MG TABS tablet Take 1 tablet (5 mg total) by mouth 2 (two) times daily. 180 tablet 3 02/18/2019 at 0900  . esomeprazole (NEXIUM) 20 MG capsule Take 20 mg by mouth daily at 12 noon.   02/18/2019 at 1200  . Fluticasone-Umeclidin-Vilant (TRELEGY ELLIPTA) 100-62.5-25 MCG/INH AEPB Inhale 1 puff into the lungs daily. 60 each  2 02/19/2019 at 0530  . gabapentin (NEURONTIN) 300 MG capsule Take 1 capsule (300 mg total) by mouth 3 (three) times daily. 90 capsule 3 02/18/2019 at 2000  . HUMIRA PEN 40 MG/0.4ML PNKT Inject 40 mg as directed. Every 2 weeks   Past Month at Unknown time  . hydroxychloroquine (PLAQUENIL) 200 MG tablet Take 200 mg by mouth 2 (two) times daily.    02/18/2019 at 2000  . propranolol ER (INDERAL LA) 80 MG 24 hr capsule Take 1 capsule (80 mg total) by mouth daily. 90 capsule 3 02/18/2019 at 0800  . rosuvastatin (CRESTOR) 10 MG tablet Take 1 tablet (10 mg total) by mouth daily. 90 tablet 3 02/18/2019 at 2000  . albuterol (PROVENTIL HFA;VENTOLIN HFA) 108 (90 Base) MCG/ACT inhaler Inhale 1-2 puffs into the lungs every 4 (four) hours as needed for wheezing or shortness of breath. 1 Inhaler 10 Unknown at prn  . furosemide (LASIX) 20 MG tablet Take 1 tablet (20 mg total) by mouth daily as needed. 90 tablet 2 Unknown at prn  . ibuprofen (ADVIL,MOTRIN) 400 MG tablet Take 400 mg by mouth every 4 (four) hours as needed.    Unknown at prn  .  lidocaine (XYLOCAINE) 5 % ointment Apply 1 application topically as needed.   Unknown at prn  . potassium chloride (K-DUR) 10 MEQ tablet Take 1 tablet (10 mEq total) by mouth daily as needed. 90 tablet 2 Unknown at prn  . traMADol (ULTRAM) 50 MG tablet Take 1 tablet (50 mg total) by mouth every 12 (twelve) hours as needed for severe pain. 30 tablet 0 Unknown at prn    Assessment: Pharmacy consulted for heparin drip management for 73 year old female with permanent atrial fibrillation and chronic diastolic heart failure followed by Dr. Rockey Situ who presented to the emergency room due to abdominal pain and found to have acute appendicitis and atrial fibrillation with RVR. Patient previously on apixaban PTA with last dose 02/20 in the evening.   Goal of Therapy: Heparin level 0.3-0.7 units/ml Monitor platelets by anticoagulation protocol: Yes  Plan:  Continue heparin 1000  units/hr. Dose reduced this am with no appreciable change in anti-Xa value. Will obtain follow up anti-Xa level at 2300.   Patient to have NG tube placement per IR. Per AM ICU rounds will initiate apixban after NG tube placement.   3/2 PM heparin level 0.68. Continue current regimen. Recheck heparin level and CBC with AM labs.  Pharmacy will continue to monitor and adjust per consult.   MLS 03/02/2019

## 2019-03-02 NOTE — Progress Notes (Signed)
[place] for Heparin  Indication: atrial fibrillation  Allergies  Allergen Reactions  . Latex     Itching Itching    Patient Measurements: Height: 5\' 6"  (167.6 cm) Weight: 211 lb 6.7 oz (95.9 kg) IBW/kg (Calculated) : 59.3 Heparin Dosing Weight: 82 kg   Vital Signs: Temp: 98.4 F (36.9 C) (03/03 1600) Temp Source: Oral (03/03 1600) BP: 148/97 (03/03 1813) Pulse Rate: 107 (03/03 1813)  Labs: Recent Labs    02/28/19 0549 03/01/19 0455 03/01/19 1447 03/01/19 2322 03/02/19 0537  HGB 12.2  --   --   --  12.0  HCT 38.8  --   --   --  38.9  PLT 221  --   --   --  219  HEPARINUNFRC 0.65 0.69 0.70 0.68 0.51  CREATININE 0.62 0.52  --   --  0.69    Estimated Creatinine Clearance: 76.3 mL/min (by C-G formula based on SCr of 0.69 mg/dL).   Medical History: Past Medical History:  Diagnosis Date  . Barretts esophagus   . Chest pain    a. 02/2014 Myoview: Ef 50%, no ischemia.  . Colon polyps   . COPD (chronic obstructive pulmonary disease) ([place])   . Depression with anxiety   . GERD (gastroesophageal reflux disease)   . Neuropathy   . Permanent atrial fibrillation    a. s/p ablation 01/2012 in [facility] by Dr. Boyd Kerbs;  b. On sotalol & Xarelto;  c. 02/2014 Echo: EF 50-55%, mild conc LVH, nl LA size/structure;  d. Recurrent afib 8/15 & 08/29/2014.  [name] Rectal fistula   . Rheumatoid arthritis ([place])    On methotrexate and orencia  . Urinary incontinence   . Vitamin D deficiency     Medications:  Medications Prior to Admission  Medication Sig Dispense Refill Last Dose  . ELIQUIS 5 MG TABS tablet Take 1 tablet (5 mg total) by mouth 2 (two) times daily. 180 tablet 3 02/18/2019 at 0900  . esomeprazole (NEXIUM) 20 MG capsule Take 20 mg by mouth daily at 12 noon.   02/18/2019 at 1200  . Fluticasone-Umeclidin-Vilant (TRELEGY ELLIPTA) 100-62.5-25 MCG/INH AEPB Inhale 1 puff into the lungs daily. 60 each 2 02/19/2019 at 0530  . gabapentin  (NEURONTIN) 300 MG capsule Take 1 capsule (300 mg total) by mouth 3 (three) times daily. 90 capsule 3 02/18/2019 at 2000  . HUMIRA PEN 40 MG/0.4ML PNKT Inject 40 mg as directed. Every 2 weeks   Past Month at Unknown time  . hydroxychloroquine (PLAQUENIL) 200 MG tablet Take 200 mg by mouth 2 (two) times daily.    02/18/2019 at 2000  . propranolol ER (INDERAL LA) 80 MG 24 hr capsule Take 1 capsule (80 mg total) by mouth daily. 90 capsule 3 02/18/2019 at 0800  . rosuvastatin (CRESTOR) 10 MG tablet Take 1 tablet (10 mg total) by mouth daily. 90 tablet 3 02/18/2019 at 2000  . albuterol (PROVENTIL HFA;VENTOLIN HFA) 108 (90 Base) MCG/ACT inhaler Inhale 1-2 puffs into the lungs every 4 (four) hours as needed for wheezing or shortness of breath. 1 Inhaler 10 Unknown at prn  . furosemide (LASIX) 20 MG tablet Take 1 tablet (20 mg total) by mouth daily as needed. 90 tablet 2 Unknown at prn  . ibuprofen (ADVIL,MOTRIN) 400 MG tablet Take 400 mg by mouth every 4 (four) hours as needed.    Unknown at prn  . lidocaine (XYLOCAINE) 5 % ointment Apply 1 application topically as needed.   Unknown at  prn  . potassium chloride (K-DUR) 10 MEQ tablet Take 1 tablet (10 mEq total) by mouth daily as needed. 90 tablet 2 Unknown at prn  . traMADol (ULTRAM) 50 MG tablet Take 1 tablet (50 mg total) by mouth every 12 (twelve) hours as needed for severe pain. 30 tablet 0 Unknown at prn    Assessment: Pharmacy consulted for heparin drip management for 73 year old female with permanent atrial fibrillation and chronic diastolic heart failure followed by Dr. Rockey Situ who presented to the emergency room due to abdominal pain and found to have acute appendicitis and atrial fibrillation with RVR. Patient previously on apixaban PTA with last dose 02/20 in the evening.   Goal of Therapy: Heparin level 0.3-0.7 units/ml Monitor platelets by anticoagulation protocol: Yes  Plan:  Patient transferred to floor and is eating better. Currently  deferring NG tube placement. Per conversation with provider will transition to cardiology recommended apixaban 5mg  BID.   Will complete consult at this time. Please re-consult if further pharmacy intervention is needed.   MLS 03/02/2019

## 2019-03-02 NOTE — Progress Notes (Signed)
Pt transferred to [facility] via bed on monitor. All belongings sent with pt.

## 2019-03-02 NOTE — Progress Notes (Signed)
[facility] [place] NAME: Brandy Armstrong    MR#:  [id number]  DATE OF BIRTH:  [birth year]  SUBJECTIVE:  CHIEF COMPLAINT:   Chief Complaint  Patient presents with  . Abdominal Pain  No complaints, heart rate much better control REVIEW OF SYSTEMS:  Review of Systems  Constitutional: Negative for chills and fever.  HENT: Negative for hearing loss and tinnitus.   Eyes: Negative for blurred vision and double vision.  Respiratory: Negative for cough, sputum production and shortness of breath.   Cardiovascular: Negative for chest pain and palpitations.  Gastrointestinal: Negative for abdominal pain, heartburn, nausea and vomiting.  Genitourinary: Negative for dysuria and urgency.  Musculoskeletal: Negative for myalgias and neck pain.  Skin: Negative for itching and rash.  Neurological: Negative for dizziness and headaches.  Psychiatric/Behavioral: Negative for depression and hallucinations.    DRUG ALLERGIES:   Allergies  Allergen Reactions  . Latex     Itching Itching   VITALS:  Blood pressure 98/64, pulse 92, temperature 98.2 F (36.8 C), temperature source Oral, resp. rate (!) 22, height 5\' 6"  (1.676 m), weight 95.9 kg, SpO2 96 %. PHYSICAL EXAMINATION:  Physical Exam  Constitutional: She appears well-developed and well-nourished.  HENT:  Head: Normocephalic and atraumatic.  Eyes: Pupils are equal, round, and reactive to light. Conjunctivae and EOM are normal.  Neck: Normal range of motion. Neck supple. No tracheal deviation present. No thyromegaly present.  Cardiovascular: Regular rhythm.  Respiratory: Effort normal and breath sounds normal.  GI: Soft. Bowel sounds are normal. There is no abdominal tenderness.  Musculoskeletal: Normal range of motion.        General: No edema.  Neurological: She is alert.  Skin: Skin is warm. No erythema.  Psychiatric: She has a normal mood and affect. Her behavior is normal.   LABORATORY  PANEL:  Female CBC Recent Labs  Lab 03/02/19 0537  WBC 6.4  HGB 12.0  HCT 38.9  PLT 219   ------------------------------------------------------------------------------------------------------------------ Chemistries  Recent Labs  Lab 03/02/19 0537  NA 137  K 4.1  CL 105  CO2 24  GLUCOSE 111*  BUN 9  CREATININE 0.69  CALCIUM 8.9  MG 1.9   RADIOLOGY:  No results found. ASSESSMENT AND PLAN:  73 year old female with permanent atrial fibrillation and chronic diastolic heart failure followed by Dr. Rockey Situ who presented to the emergency room due to abdominal pain and found to have acute appendicitis and atrial fibrillation with RVR.  1. Permanent Atrial fibrillation with RVR:  -Rate is well controlled on metoprolol 25 mg every 6 hours & Cardizem 30 mg every 8 hours -Heparin drip for anticoagulation, will long-term she will be switched over to Eliquis  2.  Acute perforated appendicitis:  - status post surgery on 02/19/2019.   Being managed by surgical team.   3 COPD without signs of exacerbation  4.  Rheumatoid arthritis: Continue outpatient regimen  5.  Hypokalemia: Replaced  6.  Acute metabolic encephalopathy. - MRI of the brain done with atrophy and chronic ischemic changes without acute intracranial abnormalities. Being managed by critical care team.  DVT prophylaxis; patient currently on heparin drip    All the records are reviewed and case discussed with Care Management/Social Worker. Management plans discussed with the patient, nursing and they are in agreement.  CODE STATUS: Full Code  TOTAL TIME TAKING CARE OF THIS PATIENT: 15 minutes.   More than 50% of the time was spent in counseling/coordination of care: YES  POSSIBLE D/C IN 2-3 DAYS, DEPENDING ON CLINICAL CONDITION.   Max Sane M.D on 03/02/2019 at 4:51 PM  Between 7am to 6pm - Pager - [phone]  After 6pm go to [email or web address] - Proofreader  [facility]   Office  [phone]  CC: Primary care physician; [name], FNP  Note: This dictation was prepared with Dragon dictation along with smaller phrase technology. Any transcriptional errors that result from this process are unintentional.

## 2019-03-02 NOTE — Progress Notes (Signed)
The patient was transfer from ICU and the heparin drip was still running when getting to the unit (2C). No order for heparin drip was found. Dr. Celine Ahr was contact to know if she wanted to stop the heparin drip and provide the patient with the schedule Eliquis. Dr. Celine Ahr was not sure why heparin drip was running. She indicated to provide the patient with Eliquis and stop the heparin drip in 8hrs (at 4am). In order to allow the eliquis to built up in the patient's system. The night shift nurse was notified. The patient continues to have loose stools at this point as well.

## 2019-03-02 NOTE — Progress Notes (Addendum)
Pharmacy Electrolyte Monitoring Consult:  Pharmacy consulted to assist in monitoring and replacing electrolytes in this 73 y.o. female admitted on 02/19/2019 with Abdominal Pain   Labs:  Sodium (mmol/L)  Date Value  03/02/2019 137  01/02/2016 141  01/04/2015 139   Potassium (mmol/L)  Date Value  03/02/2019 4.1  01/04/2015 3.8   Magnesium (mg/dL)  Date Value  03/02/2019 1.9  01/03/2015 1.9   Phosphorus (mg/dL)  Date Value  03/01/2019 3.5   Calcium (mg/dL)  Date Value  03/02/2019 8.9   Calcium, Total (mg/dL)  Date Value  01/04/2015 8.5   Albumin (g/dL)  Date Value  02/22/2019 2.7 (L)  01/02/2016 3.9  08/29/2014 2.6 (L)    Assessment/Plan: Will remove potassium from fluids and continue D5NS at 94mL/hr.   Will obtain BMP with am labs. Will order magnesium/phosphorus as clinically indicated.   Will replace for goal potassium ~ 4 and goal magnesium ~ 2.   Will continue to monitor and adjust per consult.   MLS 03/02/2019 6:18 PM

## 2019-03-03 ENCOUNTER — Other Ambulatory Visit: Payer: Self-pay

## 2019-03-03 LAB — BASIC METABOLIC PANEL
Anion gap: 8 (ref 5–15)
BUN: 8 mg/dL (ref 8–23)
CALCIUM: 8.6 mg/dL — AB (ref 8.9–10.3)
CO2: 23 mmol/L (ref 22–32)
CREATININE: 0.56 mg/dL (ref 0.44–1.00)
Chloride: 107 mmol/L (ref 98–111)
GFR calc Af Amer: >60 mL/min (ref >60)
GFR calc non Af Amer: >60 mL/min (ref >60)
Glucose, Bld: 88 mg/dL (ref 70–99)
Potassium: 3.6 mmol/L (ref 3.5–5.1)
Sodium: 138 mmol/L (ref 135–145)

## 2019-03-03 LAB — CBC
HCT: 33.8 % — ABNORMAL LOW (ref 36.0–46.0)
Hemoglobin: 10.3 g/dL — ABNORMAL LOW (ref 12.0–15.0)
MCH: 27.5 pg (ref 26.0–34.0)
MCHC: 30.5 g/dL (ref 30.0–36.0)
MCV: 90.1 fL (ref 80.0–100.0)
Platelets: 202 10*3/uL (ref 150–400)
RBC: 3.75 MIL/uL — ABNORMAL LOW (ref 3.87–5.11)
RDW: 15.4 % (ref 11.5–15.5)
WBC: 5.5 10*3/uL (ref 4.0–10.5)
nRBC: 0 % (ref 0.0–0.2)

## 2019-03-03 LAB — GLUCOSE, CAPILLARY
Glucose-Capillary: 77 mg/dL (ref 70–99)
Glucose-Capillary: 77 mg/dL (ref 70–99)
Glucose-Capillary: 88 mg/dL (ref 70–99)
Glucose-Capillary: 91 mg/dL (ref 70–99)
Glucose-Capillary: 92 mg/dL (ref 70–99)

## 2019-03-03 MED ORDER — METOPROLOL TARTRATE 25 MG PO TABS
25.0000 mg | ORAL_TABLET | Freq: Once | ORAL | Status: AC
  Administered 2019-03-03: 25 mg via ORAL
  Filled 2019-03-03: qty 1

## 2019-03-03 MED ORDER — SODIUM CHLORIDE 0.9 % IV BOLUS
500.0000 mL | Freq: Once | INTRAVENOUS | Status: AC
  Administered 2019-03-03: 500 mL via INTRAVENOUS

## 2019-03-03 MED ORDER — METOPROLOL TARTRATE 50 MG PO TABS
50.0000 mg | ORAL_TABLET | Freq: Two times a day (BID) | ORAL | Status: DC
  Administered 2019-03-03 – 2019-03-08 (×10): 50 mg via ORAL
  Filled 2019-03-03 (×10): qty 1

## 2019-03-03 MED ORDER — SODIUM CHLORIDE 0.9 % BOLUS PEDS: 500.0000 mL | Freq: Once | INTRAVENOUS | Status: DC

## 2019-03-03 MED ORDER — DILTIAZEM HCL ER COATED BEADS 120 MG PO CP24
120.0000 mg | ORAL_CAPSULE | Freq: Every day | ORAL | Status: DC
  Administered 2019-03-03 – 2019-03-08 (×6): 120 mg via ORAL
  Filled 2019-03-03 (×6): qty 1

## 2019-03-03 NOTE — Progress Notes (Signed)
PT Cancellation Note  Patient Details Name: Brandy Armstrong MRN: [id number] DOB: [birth year]   Cancelled Treatment:    Reason Eval/Treat Not Completed: Other (comment). Treatment attempted twice; pt in pt care. Re attempt at a later date when pt available.    Larae Grooms, PTA 03/03/2019, 4:15 PM

## 2019-03-03 NOTE — NC FL2 (Signed)
[place] LEVEL OF CARE SCREENING TOOL     IDENTIFICATION  Patient Name: Brandy Armstrong Birthdate: [birth year] Sex: female Admission Date (Current Location): 02/19/2019  [place] and [place] Number:  Engineering geologist and Address:  [facility], [address] [place], [place] [address]      Provider Number: [id number]  Attending Physician Name and Address:  Herbert Pun, MD  Relative Name and Phone Number:       Current Level of Care: Hospital Recommended Level of Care: [place] Prior Approval Number:    Date Approved/Denied:   PASRR Number: [id number]  Discharge Plan: SNF    Current Diagnoses: Patient Active Problem List   Diagnosis Date Noted  . Acute appendicitis   . Acute appendicitis with localized peritonitis 02/19/2019  . Chronic diastolic heart failure ([place]) 02/10/2019  . Atherosclerosis of aorta ([place]) 02/05/2019  . AAA (abdominal aortic aneurysm) without rupture ([place]) 12/17/2018  . COPD exacerbation ([place]) 12/09/2018  . Iron deficiency anemia due to chronic blood loss 11/03/2018  . Obesity, Class II, BMI 35-39.9 09/23/2018  . Primary osteoarthritis of right knee 09/23/2018  . GERD (gastroesophageal reflux disease) 08/24/2018  . Hives 06/17/2018  . B12 deficiency 06/17/2018  . Depression, recurrent ([place]) 03/23/2018  . Purpura ([place]) 08/06/2017  . Radicular pain in left arm 09/30/2016  . Cellulitis 09/26/2016  . Abscess 09/24/2016  . PAD (peripheral artery disease) ([place]) 08/13/2016  . Anemia 08/13/2016  . Urinary urgency 08/06/2016  . Chronic back pain 05/20/2016  . Neuropathy 10/05/2015  . Tremors of nervous system 06/18/2015  . GI bleed due to NSAIDs 05/04/2015  . Atrial fibrillation with rapid ventricular response ([place]) 03/11/2014  . COPD (chronic obstructive pulmonary disease) ([place]) 03/11/2014  . Flexor hallucis longus tendinitis 11/09/2013  . Vertigo 09/16/2013  . Chronic steroid use  09/11/2013  . Osteoarthritis 09/11/2013  . Shortness of breath 08/18/2013  . Fatigue 08/18/2013  . Barrett's esophagus 08/18/2013  . Chronic diarrhea 08/18/2013  . Numbness and tingling 08/18/2013  . Screening for breast cancer 08/18/2013  . Rheumatoid arthritis ([place]) 05/31/2013  . Seborrheic keratosis 12/14/2012  . Benign neoplasm of colon 11/23/2012  . Family history of malignant neoplasm of gastrointestinal tract 11/23/2012  . Prediabetes 12/16/2011    Orientation RESPIRATION BLADDER Height & Weight     Self, Time, Place  Normal Incontinent Weight: 211 lb 6.7 oz (95.9 kg) Height:  5\' 6"  (315.1 cm)  BEHAVIORAL SYMPTOMS/MOOD NEUROLOGICAL BOWEL NUTRITION STATUS  (none) (none) Incontinent Diet(Regular diet )  AMBULATORY STATUS COMMUNICATION OF NEEDS Skin   Extensive Assist Verbally Other (Comment)(closed incision of abdomen )                       Personal Care Assistance Level of Assistance  Bathing, Feeding, Dressing Bathing Assistance: Limited assistance Feeding assistance: Independent Dressing Assistance: Limited assistance     Functional Limitations Info  Sight, Hearing, Speech Sight Info: Adequate Hearing Info: Adequate Speech Info: Adequate    SPECIAL CARE FACTORS FREQUENCY  PT (By licensed PT), OT (By licensed OT)                    Contractures Contractures Info: Not present    Additional Factors Info  Code Status, Allergies Code Status Info: Full Code  Allergies Info: Latex           Current Medications (03/03/2019):  This is the current hospital active medication list Current Facility-Administered Medications  Medication Dose Route Frequency Provider Last Rate Last Dose  . acetaminophen (TYLENOL) tablet 650 mg  650 mg Oral Q6H PRN Herbert Pun, MD   650 mg at 03/03/19 1436   Or  . acetaminophen (TYLENOL) suppository 650 mg  650 mg Rectal Q6H PRN Herbert Pun, MD      . apixaban (ELIQUIS) tablet 5 mg  5 mg Oral BID Flora Lipps, MD   5 mg at 03/03/19 0916  . budesonide (PULMICORT) nebulizer solution 0.25 mg  0.25 mg Nebulization Q12H Wilhelmina Mcardle, MD   0.25 mg at 03/03/19 0730  . dextrose 5 %-0.9 % sodium chloride infusion   Intravenous Continuous Charlett Nose, RPH 50 mL/hr at 03/03/19 1221    . diltiazem (CARDIZEM CD) 24 hr capsule 120 mg  120 mg Oral Daily Christell Faith M, PA-C   120 mg at 03/03/19 1052  . famotidine (PEPCID) tablet 20 mg  20 mg Oral BID Charlett Nose, RPH   20 mg at 03/03/19 3299  . fentaNYL (SUBLIMAZE) injection 25-100 mcg  25-100 mcg Intravenous Q2H PRN Wilhelmina Mcardle, MD   25 mcg at 03/02/19 0409  . HYDROcodone-acetaminophen (NORCO/VICODIN) 5-325 MG per tablet 1-2 tablet  1-2 tablet Oral Q4H PRN Herbert Pun, MD      . ipratropium (ATROVENT) nebulizer solution 0.5 mg  0.5 mg Nebulization BID Ojie, Jude, MD   0.5 mg at 03/03/19 0730  . levalbuterol (XOPENEX) nebulizer solution 0.63 mg  0.63 mg Nebulization BID Stark Jock, Jude, MD   0.63 mg at 03/02/19 2032  . metoprolol tartrate (LOPRESSOR) injection 5 mg  5 mg Intravenous Q6H PRN Kathlyn Sacramento A, MD      . metoprolol tartrate (LOPRESSOR) tablet 50 mg  50 mg Oral BID Dunn, Ryan M, PA-C      . ondansetron (ZOFRAN-ODT) disintegrating tablet 4 mg  4 mg Oral Q6H PRN Herbert Pun, MD       Or  . ondansetron (ZOFRAN) injection 4 mg  4 mg Intravenous Q6H PRN Herbert Pun, MD   4 mg at 03/01/19 0403  . sodium chloride flush (NS) 0.9 % injection 10-40 mL  10-40 mL Intracatheter Q12H Minna Merritts, MD   10 mL at 03/03/19 0916  . sodium chloride flush (NS) 0.9 % injection 10-40 mL  10-40 mL Intracatheter PRN Rockey Situ, Kathlene November, MD         Discharge Medications: Please see discharge summary for a list of discharge medications.  Relevant Imaging Results:  Relevant Lab Results:   Additional Information SSN: [id number]  [name], [place]

## 2019-03-03 NOTE — Progress Notes (Signed)
[place] at [place] NAME: Brandy Armstrong    MR#:  [id number]  DATE OF BIRTH:  12/06/1946  SUBJECTIVE:  CHIEF COMPLAINT:   Chief Complaint  Patient presents with  . Abdominal Pain  No complaints, heart rate much better control, foley removed REVIEW OF SYSTEMS:  Review of Systems  Constitutional: Negative for chills and fever.  HENT: Negative for hearing loss and tinnitus.   Eyes: Negative for blurred vision and double vision.  Respiratory: Negative for cough, sputum production and shortness of breath.   Cardiovascular: Negative for chest pain and palpitations.  Gastrointestinal: Negative for abdominal pain, heartburn, nausea and vomiting.  Genitourinary: Negative for dysuria and urgency.  Musculoskeletal: Negative for myalgias and neck pain.  Skin: Negative for itching and rash.  Neurological: Negative for dizziness and headaches.  Psychiatric/Behavioral: Negative for depression and hallucinations.    DRUG ALLERGIES:   Allergies  Allergen Reactions  . Latex     Itching Itching   VITALS:  Blood pressure (!) 131/93, pulse (!) 101, temperature 98.5 F (36.9 C), temperature source Oral, resp. rate 20, height 5\' 6"  (1.676 m), weight 95.9 kg, SpO2 93 %. PHYSICAL EXAMINATION:  Physical Exam  Constitutional: She appears well-developed and well-nourished.  HENT:  Head: Normocephalic and atraumatic.  Eyes: Pupils are equal, round, and reactive to light. Conjunctivae and EOM are normal.  Neck: Normal range of motion. Neck supple. No tracheal deviation present. No thyromegaly present.  Cardiovascular: Regular rhythm.  Respiratory: Effort normal and breath sounds normal.  GI: Soft. Bowel sounds are normal. There is no abdominal tenderness.  Musculoskeletal: Normal range of motion.        General: No edema.  Neurological: She is alert.  Skin: Skin is warm. No erythema.  Psychiatric: She has a normal mood and affect. Her behavior is  normal.   LABORATORY PANEL:  Female CBC Recent Labs  Lab 03/03/19 0600  WBC 5.5  HGB 10.3*  HCT 33.8*  PLT 202   ------------------------------------------------------------------------------------------------------------------ Chemistries  Recent Labs  Lab 03/02/19 0537 03/03/19 0600  NA 137 138  K 4.1 3.6  CL 105 107  CO2 24 23  GLUCOSE 111* 88  BUN 9 8  CREATININE 0.69 0.56  CALCIUM 8.9 8.6*  MG 1.9  --    RADIOLOGY:  No results found. ASSESSMENT AND PLAN:  73 year old female with permanent atrial fibrillation and chronic diastolic heart failure followed by Dr. Rockey Situ who presented to the emergency room due to abdominal pain and found to have acute appendicitis and atrial fibrillation with RVR.  1. Permanent Atrial fibrillation with RVR:  -Rate is well controlled on metoprolol 50 mg BID & Cardizem 120 mg daily - mgmt per cardio - will need apixaban 5 mg twice daily for stroke prevention.    2.  Acute perforated appendicitis:  - status post surgery on 02/19/2019.   Being managed by surgical team.   3 COPD without signs of exacerbation  4.  Rheumatoid arthritis: Continue outpatient regimen  5.  Hypokalemia: Replaced  6.  Acute metabolic encephalopathy. - MRI of the brain done with atrophy and chronic ischemic changes without acute intracranial abnormalities. Being managed by critical care team.  DVT prophylaxis; on Eliquis   D/w primary team. Will sign off. Patient is medically ready for D/C. STR/snf    All the records are reviewed and case discussed with Care Management/Social Worker. Management plans discussed with the patient, nursing and they are in agreement.  CODE  STATUS: Full Code  TOTAL TIME TAKING CARE OF THIS PATIENT: 15 minutes.   More than 50% of the time was spent in counseling/coordination of care: YES  POSSIBLE D/C IN 1 DAYS, DEPENDING ON CLINICAL CONDITION.   Max Sane M.D on 03/03/2019 at 10:38 PM  Between 7am to 6pm - Pager  - [phone]  After 6pm go to [email or web address] - Proofreader  [facility]  Office  [phone]  CC: Primary care physician; [name], FNP  Note: This dictation was prepared with Dragon dictation along with smaller phrase technology. Any transcriptional errors that result from this process are unintentional.

## 2019-03-03 NOTE — Progress Notes (Signed)
Occupational Therapy Treatment Patient Details Name: Brandy Armstrong MRN: [id number] DOB: 05-Apr-1946 Today's Date: 03/03/2019    History of present illness Pt is a 73 y.o. female presented to [facility] ED with abdominal pain.  CT of abdomen revealed acute appendicitis.  Underwent laparoscopic which was converted to open appendectomy 2/21 secondary acute perforated appendicitis.  Remained intubated post procedure until 2/22. Pt with significant confusion since surgery; MRI negative for acute findings.  Atrial fibrillation with RVR and delerium post-op.  PMH includes: a-fib, AAA, vertigo, COPD & RA.    OT comments  Pt seen for OT treatment on this date. Upon arrival to room pt awake/alert/easily awoken. Pt semi-reclined in bed. Pt A&O to self, place, and date. Had difficulty remembering why she was in the hospital and demonstrated some continued cognitive deficits t/o session including having difficulty with word finding, attending to tasks/VCs, and following verbal. Pt agreeable to tx. Pt assisted with EOB grooming and hair washing; required mod A for ADL assistance. Pt was able to get EOB with +1 mod/max assist, but continues to require +2 mod assist for sit>sup transfer. Standing not yet attempted with this pt. Pt making good progress toward goals. Pt continues to benefit from skilled OT services to maximize return to PLOF and minimize risk of future falls, injury, and readmission. Will continue to follow POC. Discharge recommendation remains appropriate.    Follow Up Recommendations  SNF;Supervision/Assistance - 24 hour    Equipment Recommendations  (TBD)    Recommendations for Other Services      Precautions / Restrictions Precautions Precautions: Fall Precaution Comments: latex allergy Restrictions Weight Bearing Restrictions: No       Mobility Bed Mobility Overal bed mobility: Needs Assistance Bed Mobility: Supine to Sit;Sit to Supine     Supine to sit: HOB elevated;Max assist Sit to  supine: Mod assist;+2 for physical assistance;+2 for safety/equipment;HOB elevated   General bed mobility comments: Assist provided for turnk and LE's. Heavy VC's for sequencing. Pt had difficulty following verbal directions. Appeared to have good AROM of B UE & B LE in bed, but had significant difficulty sequencing transfers.   Transfers                      Balance Overall balance assessment: Needs assistance Sitting-balance support: Bilateral upper extremity supported;Feet supported Sitting balance-Leahy Scale: Poor Sitting balance - Comments: Fluctuating between CGA to min assist for sitting balance                                   ADL either performed or assessed with clinical judgement   ADL Overall ADL's : Needs assistance/impaired Eating/Feeding: Set up;Sitting;Minimal assistance Eating/Feeding Details (indicate cue type and reason): Pt diet back to regular. RN indicated she still is not eating much, but has asked for food more since leaving CCU.  Grooming: Set up;Minimal assistance;Sitting Grooming Details (indicate cue type and reason): Pt with some difficulty maintaining seated balance EOB for grooming activity. Required min A and VCs for sequencing. Had difficulty attending to task.  Upper Body Bathing: Set up;Moderate assistance;Sitting Upper Body Bathing Details (indicate cue type and reason): Pt required mod A with set-up for hair washing with shampoo cap. Pt able to reach head with hands, but had difficulty maintaining balance.  Vision Baseline Vision/History: Wears glasses Wears Glasses: Reading only Patient Visual Report: No change from baseline     Perception     Praxis      Cognition Arousal/Alertness: Awake/alert Behavior During Therapy: Anxious;WFL for tasks assessed/performed Overall Cognitive Status: No family/caregiver present to determine baseline cognitive functioning                                  General Comments: Pt cognition improved since time of evaluation. Alert and oriented to name, date, and place. Had some difficulty with situation. Was not able to recall she had a surgery. Stated "I busted a gut" when asked why she was in the hospital. Pt with some difficulty attending to tasks and following verbal prompts on this date. Tended to be easily distracted by items in room.         Exercises Other Exercises Other Exercises: Pt instructed in bed mobility technique on this date. Pt able to complete sup<>sit transfer with therapist assistance +2.  Other Exercises: Assisted pt with grooming/hair washing ADL tasks EOB.    Shoulder Instructions       General Comments      Pertinent Vitals/ Pain       Faces Pain Scale: Hurts a little bit Pain Location: Pt endorsed some discomfort in her back while laying in bed.  Pain Descriptors / Indicators: Grimacing;Guarding Pain Intervention(s): Limited activity within patient's tolerance;Monitored during session;Repositioned  Home Living Family/patient expects to be discharged to:: Assisted living Living Arrangements: Other (Comment)(independent living)                                      Prior Functioning/Environment              Frequency  Min 1X/week        Progress Toward Goals  OT Goals(current goals can now be found in the care plan section)  Progress towards OT goals: Progressing toward goals  Acute Rehab OT Goals Patient Stated Goal: To get memory back OT Goal Formulation: With patient Time For Goal Achievement: 03/15/19 Potential to Achieve Goals: Good  Plan Discharge plan remains appropriate;Frequency remains appropriate    Co-evaluation                 AM-PAC OT "6 Clicks" Daily Activity     Outcome Measure   Help from another person eating meals?: A Little Help from another person taking care of personal grooming?: A Little Help from another  person toileting, which includes using toliet, bedpan, or urinal?: A Lot Help from another person bathing (including washing, rinsing, drying)?: A Lot Help from another person to put on and taking off regular upper body clothing?: A Little Help from another person to put on and taking off regular lower body clothing?: A Lot 6 Click Score: 15    End of Session    OT Visit Diagnosis: Other abnormalities of gait and mobility (R26.89);Muscle weakness (generalized) (M62.81);Unsteadiness on feet (R26.81);Other symptoms and signs involving cognitive function   Activity Tolerance Patient tolerated treatment well   Patient Left in bed;with bed alarm set;with call bell/phone within reach;with nursing/sitter in room   Nurse Communication Mobility status;Other (comment)(Pt cognitive status/need for +2 assist for sit>sup. )        Time: 1400-1436 OT Time Calculation (min): 36 min  Charges: OT General Charges $  OT Visit: 1 Visit OT Treatments $Self Care/Home Management : 23-37 mins  Shara Blazing, M.S., OTR/L Ascom: [phone] 03/03/19, 3:10 PM

## 2019-03-03 NOTE — Progress Notes (Signed)
[place] Hospital Day(s): 12.   Post op day(s): 12 Days Post-Op.   Interval History: Patient seen and examined, no acute events or new complaints overnight. Patient reports she is feeling better today.  She reports that she wants to [employer] and [employer] ice tea.  She denies abdominal pain nausea or vomiting.  Heart rate this morning is better.  She denies chest pain or shortness of breath.  Vital signs in last 24 hours: [min-max] current  Temp:  [98.3 F (36.8 C)-98.9 F (37.2 C)] 98.9 F (37.2 C) (03/04 0420) Pulse Rate:  [88-109] 88 (03/04 1051) Resp:  [16-25] 19 (03/04 0420) BP: (93-148)/(47-97) 132/82 (03/04 1051) SpO2:  [96 %-98 %] 96 % (03/04 0730)     Height: 5\' 6"  (167.6 cm) Weight: 95.9 kg BMI (Calculated): 34.14   Physical Exam:  Constitutional: alert, cooperative and no distress  Respiratory: breathing non-labored at rest  Cardiovascular: regular rate and sinus rhythm  Gastrointestinal: soft, non-tender, and non-distended.  Wound is dry and clean.  Drain removed  Labs:  CBC Latest Ref Rng & Units 03/03/2019 03/02/2019 02/28/2019  WBC 4.0 - 10.5 K/uL 5.5 6.4 8.1  Hemoglobin 12.0 - 15.0 g/dL 10.3(L) 12.0 12.2  Hematocrit 36.0 - 46.0 % 33.8(L) 38.9 38.8  Platelets 150 - 400 K/uL 202 219 221   CMP Latest Ref Rng & Units 03/03/2019 03/02/2019 03/01/2019  Glucose 70 - 99 mg/dL 88 111(H) 118(H)  BUN 8 - 23 mg/dL 8 9 7(L)  Creatinine 0.44 - 1.00 mg/dL 0.56 0.69 0.52  Sodium 135 - 145 mmol/L 138 137 136  Potassium 3.5 - 5.1 mmol/L 3.6 4.1 3.3(L)  Chloride 98 - 111 mmol/L 107 105 105  CO2 22 - 32 mmol/L 23 24 23   Calcium 8.9 - 10.3 mg/dL 8.6(L) 8.9 8.3(L)  Total Protein 6.5 - 8.1 g/dL - - -  Total Bilirubin 0.3 - 1.2 mg/dL - - -  Alkaline Phos 38 - 126 U/L - - -  AST 15 - 41 U/L - - -  ALT 0 - 44 U/L - - -    Imaging studies: No new pertinent imaging studies   Assessment/Plan:  73 y.o.femalewith acute perforated appendicitis12Day Post-Ops/p  laparoscopic converted to open appendectomy, complicated by pertinent comorbidities includingatrial fibrillation on anticoagulation, COPD, rheumatoid arthritis. Patient today continues to be more alert and active.  I personally clean her wound and removed most of her staples and put some Steri-Strips.  I removed the JP.  I advance her diet to regular diet as she is wanting more solid food.  Her heart rate today is better.  She is already on full anticoagulation.  From surgery standpoint patient can be discharged.  I am waiting for cardiology for further recommendations.  I also talked to social work to start the process of finding a skilled nursing facility as the patient will need to continue physical therapy in a rehab center.  No contraindication to progress on physical therapy.  Will follow cardiology hospital recommendation regarding her medical comorbidities.  Arnold Long, MD

## 2019-03-03 NOTE — Progress Notes (Signed)
Progress Note  Patient Name: Brandy Armstrong Date of Encounter: 03/03/2019  Primary Cardiologist: Rockey Situ  Subjective   Transferred out of the ICU on 3/3.  Remains in A. fib with reasonably controlled ventricular rates.  No chest pain or shortness of breath.  Rare fleeting episode of palpitations.  Transitioned to Eliquis.  Potassium 3.6 this morning.  Hemoglobin 10.3.  Inpatient Medications    Scheduled Meds: . apixaban  5 mg Oral BID  . budesonide (PULMICORT) nebulizer solution  0.25 mg Nebulization Q12H  . diltiazem  120 mg Oral Daily  . famotidine  20 mg Oral BID  . ipratropium  0.5 mg Nebulization BID  . levalbuterol  0.63 mg Nebulization BID  . metoprolol tartrate  50 mg Oral BID  . sodium chloride flush  10-40 mL Intracatheter Q12H   Continuous Infusions: . dextrose 5 % and 0.9% NaCl 50 mL/hr (03/03/19 0500)   PRN Meds: acetaminophen **OR** acetaminophen, fentaNYL (SUBLIMAZE) injection, HYDROcodone-acetaminophen, metoprolol tartrate, ondansetron **OR** ondansetron (ZOFRAN) IV, sodium chloride flush   Vital Signs    Vitals:   03/03/19 0117 03/03/19 0420 03/03/19 0730 03/03/19 1051  BP:  116/65  132/82  Pulse: (!) 109 90  88  Resp:  19    Temp:  98.9 F (37.2 C)    TempSrc:  Oral    SpO2:  96% 96%   Weight:      Height:        Intake/Output Summary (Last 24 hours) at 03/03/2019 1104 Last data filed at 03/03/2019 1100 Gross per 24 hour  Intake 1826.63 ml  Output 945 ml  Net 881.63 ml   Filed Weights   02/22/19 1500 02/28/19 0500 03/02/19 0459  Weight: 100.2 kg 96.8 kg 95.9 kg    Telemetry    A. fib, 80s to low 100s bpm- Personally Reviewed  ECG    n/a - Personally Reviewed  Physical Exam   GEN: No acute distress.   Neck: No JVD. Cardiac:  Irregularly irregular, no murmurs, rubs, or gallops.  Respiratory: Clear to auscultation bilaterally.  GI: Soft, nontender, non-distended.   MS: No edema; No deformity. Neuro:  Alert and oriented x 3;  Nonfocal.  Psych: Normal affect.  Labs    Chemistry Recent Labs  Lab 03/01/19 0455 03/02/19 0537 03/03/19 0600  NA 136 137 138  K 3.3* 4.1 3.6  CL 105 105 107  CO2 23 24 23   GLUCOSE 118* 111* 88  BUN 7* 9 8  CREATININE 0.52 0.69 0.56  CALCIUM 8.3* 8.9 8.6*  GFRNONAA >60 >60 >60  GFRAA >60 >60 >60  ANIONGAP 8 8 8      Hematology Recent Labs  Lab 02/28/19 0549 03/02/19 0537 03/03/19 0600  WBC 8.1 6.4 5.5  RBC 4.35 4.32 3.75*  HGB 12.2 12.0 10.3*  HCT 38.8 38.9 33.8*  MCV 89.2 90.0 90.1  MCH 28.0 27.8 27.5  MCHC 31.4 30.8 30.5  RDW 15.1 15.7* 15.4  PLT 221 219 202    Cardiac EnzymesNo results for input(s): TROPONINI in the last 168 hours. No results for input(s): TROPIPOC in the last 168 hours.   BNPNo results for input(s): BNP, PROBNP in the last 168 hours.   DDimer No results for input(s): DDIMER in the last 168 hours.   Radiology    Dg Chest Port 1 View  Result Date: 03/01/2019 IMPRESSION: 1. Right PICC terminates at the cavoatrial junction. 2. Stable mild cardiomegaly without overt pulmonary edema. 3. Decreased left retrocardiac opacity, favor decreasing  atelectasis. Electronically Signed   By: Ilona Sorrel M.D.   On: 03/01/2019 15:41    Cardiac Studies   2D Echocardiogram2.22.2020  1. The left ventricle has hyperdynamic systolic function, with an ejection fraction of >65%. The cavity size was normal. There is mildly increased left ventricular wall thickness. Left ventricular diastology could not be evaluated secondary to atrial  fibrillation. 2. The right ventricle has normal systolic function. The cavity was normal. There is no increase in right ventricular wall thickness. 3. The mitral valve is normal in structure. 4. The tricuspid valve is normal in structure. 5. The aortic valve is normal in structure. Mild thickening of the aortic valve no stenosis of the aortic valve. 6. The interatrial septum was not assessed.  Patient Profile     73  y.o. female with history of permanent Afib on Eliquis s/p ablation in 2013 with recurrent Afib s/p repeat ablation in 01/2015 s/p DCCV in 02/2015 s/p DCCV in 08/2016 with recurrent Afib 1 month later leading to the discontinuation of amiodarone and rate control strategy since, aortic atherosclerosis, chronic diastolic CHF, GI bleed on Xarelto, COPD secondary to tobacco abuse quitting in 2012,andanemia, who was admitted 2/21 with acute appendicitis now s/p open appendectomy complicated by rapid Afib.  Assessment & Plan    1. Permanent Afib with RVR: -Remains in Afib with ventricular rates in the 80s to 100s bpm -Consolidate Lopressor to 50 mg twice daily and short acting diltiazem to Cardizem CD 120 mg daily -The patient has had difficulty in maintaining sinus rhythm in the past as above, in this setting, DCCV should be only considered as a last resort -If ventricular rates are proved difficult to control, consider AV node ablation with PPM  2. Acute appendicitis complicated by septic shock: -Status post open appendectomy -Shock resolved -Per surgery   3. HFpEF: -She does not appear volume up -IV Lasix held on 3/3    For questions or updates, please contact [facility] Please consult [email or web address] for contact info under Cardiology/STEMI.    Signed, Christell Faith, PA-C [place] Pager: [phone] 03/03/2019, 11:04 AM

## 2019-03-03 NOTE — Clinical Social Work Note (Signed)
Clinical Social Work Assessment  Patient Details  Name: Brandy Armstrong MRN: [id number] Date of Birth: 1946-12-27  Date of referral:  03/03/19               Reason for consult:  Discharge Planning                Permission sought to share information with:  Chartered certified accountant granted to share information::  Yes, Verbal Permission Granted  Name::        Agency::     Relationship::     Contact Information:     Housing/Transportation Living arrangements for the past 2 months:  Single Family Home Source of Information:  Patient Patient Interpreter Needed:  None Criminal Activity/Legal Involvement Pertinent to Current Situation/Hospitalization:  No - Comment as needed Significant Relationships:  Friend, Siblings Lives with:  Self Do you feel safe going back to the place where you live?  Yes Need for family participation in patient care:  Yes (Comment)  Care giving concerns:  Patient resides at home.    Social Worker assessment / plan:  CSW spoke with patient this afternoon regarding PT recommendation for skilled rehab. Patient stated that she would prefer not to go to rehab but is willing to consider. She stated that CSW could conduct bed search. Medicare.gov list of star ratings for facilities given to patient. Bed search initiated. CSW will follow up with patient regarding bed offers.  Employment status:    Insurance informationEducational psychologist PT Recommendations:  Elim / Referral to community resources:     Patient/Family's Response to care:  Patient expressed appreciation for CSW assistance.  Patient/Family's Understanding of and Emotional Response to Diagnosis, Current Treatment, and Prognosis:  Patient prefers to return home but understands that this may not be feasible.  Emotional Assessment Appearance:  Appears stated age Attitude/Demeanor/Rapport:  (pleasant and cooperative) Affect (typically observed):  Accepting,  Calm Orientation:  Oriented to Self, Oriented to Place, Oriented to  Time Alcohol / Substance use:  Not Applicable Psych involvement (Current and /or in the community):  No (Comment)  Discharge Needs  Concerns to be addressed:  Care Coordination Readmission within the last 30 days:  No Current discharge risk:  None Barriers to Discharge:  No Barriers Identified   Shela Leff, [place] 03/03/2019, 3:26 PM

## 2019-03-03 NOTE — Progress Notes (Signed)
Foley was removed at 1125 today.  Patient has not been able to void since then.  Dr Rosana Hoes notified.  He wants nursing to encourage the patient to drink fluids and give her 2 more hours to try to void.  If patient is unable to void by 2125 then nursing is to infuse a 500 ml bolus of NS.  If patient complains of pressure in her abdomen then nursing should straight cath the patient.  If patient requires 2 straight caths then nursing should place a foley catheter

## 2019-03-03 NOTE — Progress Notes (Signed)
Pharmacy Electrolyte Monitoring Consult:  Pharmacy consulted to assist in monitoring and replacing electrolytes in this 73 y.o. female admitted on 02/19/2019 with Abdominal Pain   Labs:  Sodium (mmol/L)  Date Value  03/03/2019 138  01/02/2016 141  01/04/2015 139   Potassium (mmol/L)  Date Value  03/03/2019 3.6  01/04/2015 3.8   Magnesium (mg/dL)  Date Value  03/02/2019 1.9  01/03/2015 1.9   Phosphorus (mg/dL)  Date Value  03/01/2019 3.5   Calcium (mg/dL)  Date Value  03/03/2019 8.6 (L)   Calcium, Total (mg/dL)  Date Value  01/04/2015 8.5   Albumin (g/dL)  Date Value  02/22/2019 2.7 (L)  01/02/2016 3.9  08/29/2014 2.6 (L)    Assessment/Plan: Will continue D5NS at 34mL/hr.   All electrolytes are wnl for a second consecutive day  Because this consult was generated in CCU pharmacy will sign off for now  Vallery Sa, PharmD 03/03/2019 7:28 AM

## 2019-03-03 NOTE — Care Management Important Message (Signed)
Copy of signed Medicare IM left with patient in room. 

## 2019-03-03 NOTE — Progress Notes (Signed)
Order received to change diet to regular

## 2019-03-04 MED ORDER — ENSURE ENLIVE PO LIQD
237.0000 mL | Freq: Two times a day (BID) | ORAL | Status: DC
  Administered 2019-03-04 – 2019-03-08 (×8): 237 mL via ORAL

## 2019-03-04 NOTE — Progress Notes (Addendum)
[place] Hospital Day(s): 13.   Post op day(s): 13 Days Post-Op.   Interval History: Patient seen and examined, no acute events or new complaints overnight. Patient reports like the spaghetti and meat still because it was too spicy.  She reports feeling better.  She denies any nausea or vomiting.  Nurse reports that she had urinary retention yesterday and needed in and out catheter.    Vital signs in last 24 hours: [min-max] current  Temp:  [98.5 F (36.9 C)] 98.5 F (36.9 C) (03/05 0554) Pulse Rate:  [80-101] 90 (03/05 0554) Resp:  [16-22] 22 (03/05 0554) BP: (131-157)/(72-93) 157/84 (03/05 0554) SpO2:  [93 %-96 %] 95 % (03/05 0739) Weight:  [98.2 kg] 98.2 kg (03/05 0500)     Height: 5\' 6"  (167.6 cm) Weight: 98.2 kg BMI (Calculated): 34.96   Urine: 1135 mL  Physical Exam:  Constitutional: alert, cooperative and no distress  Respiratory: breathing non-labored at rest  Cardiovascular: Irregular rate and rhythm Gastrointestinal: soft, non-tender, and non-distended.  Wound is dry and clean  Labs:  CBC Latest Ref Rng & Units 03/03/2019 03/02/2019 02/28/2019  WBC 4.0 - 10.5 K/uL 5.5 6.4 8.1  Hemoglobin 12.0 - 15.0 g/dL 10.3(L) 12.0 12.2  Hematocrit 36.0 - 46.0 % 33.8(L) 38.9 38.8  Platelets 150 - 400 K/uL 202 219 221   CMP Latest Ref Rng & Units 03/03/2019 03/02/2019 03/01/2019  Glucose 70 - 99 mg/dL 88 111(H) 118(H)  BUN 8 - 23 mg/dL 8 9 7(L)  Creatinine 0.44 - 1.00 mg/dL 0.56 0.69 0.52  Sodium 135 - 145 mmol/L 138 137 136  Potassium 3.5 - 5.1 mmol/L 3.6 4.1 3.3(L)  Chloride 98 - 111 mmol/L 107 105 105  CO2 22 - 32 mmol/L 23 24 23   Calcium 8.9 - 10.3 mg/dL 8.6(L) 8.9 8.3(L)  Total Protein 6.5 - 8.1 g/dL - - -  Total Bilirubin 0.3 - 1.2 mg/dL - - -  Alkaline Phos 38 - 126 U/L - - -  AST 15 - 41 U/L - - -  ALT 0 - 44 U/L - - -    Imaging studies: No new pertinent imaging studies   Assessment/Plan:  73 y.o.femalewith acute perforated appendicitis13Day  Post-Ops/p laparoscopic converted to open appendectomy, complicated by pertinent comorbidities includingatrial fibrillation on anticoagulation, COPD, rheumatoid arthritis.  Patient was initially admitted with acute appendicitis.  Due to recent intake of therapeutic anticoagulation the night before and no sign of perforation patient was started with IV antibiotic therapy.  During the day there was clinical deterioration with fever and altered mental status and worsening abdominal pain.  It was decided to take patient to the OR for appendectomy.  I started the procedure laparoscopically and due to the difficulty of the case and the perforation of the appendix it was converted to open appendectomy.  Postop patient was sent to ICU due to respiratory failure or mechanical ventilation.  Patient was extubated the next day.  She developed severe encephalopathy for around 4 to 5 days.  MRI of the head was negative.  Electrolytes and hemoglobin was within normal limits.  It was difficult to control her heart rate for the first week after surgery with multiple therapies and drips.  Patient was on therapeutic anticoagulation without any sign of bleeding.  Finally the encephalopathy improved and resolved and now her heart rate is in an adequate range.  She has been followed by cardiology who gave the recommendation for long-term heart rate control therapy.  Cardiology  signing off of case.  Patient is currently on Eliquis, diltiazem and metoprolol.  Patient is tolerating regular diet.  Yesterday she developed urinary retention after Foley removal.  If it does not resolve today will consult urology.  Patient is also getting stronger and having physical therapy.  Patient will need skilled nursing facility to continue rehab.  From my standpoint she is stable to be discharged once congestive facility coordinated.  During the admission patient was followed by critical care physician during her ICU stay and cardiology for the  management of A. fib.  He is also been seen by physical therapist.  Arnold Long, MD  Addendum:  Patient able to void spontaneously 450 this morning.  There is no need to consult urology at this moment.  Still pending placement for skilled nursing facility when approved by insurance company.

## 2019-03-05 MED ORDER — LEVALBUTEROL HCL 0.63 MG/3ML IN NEBU
0.6300 mg | INHALATION_SOLUTION | Freq: Four times a day (QID) | RESPIRATORY_TRACT | Status: DC | PRN
  Filled 2019-03-05: qty 3

## 2019-03-05 NOTE — Progress Notes (Signed)
Physical Therapy Treatment Patient Details Name: GENEVRA ORNE MRN: [id number] DOB: February 07, 1946 Today's Date: 03/05/2019    History of Present Illness 73 y.o. female presented to [facility] ED 2/21 with abdominal pain.  CT of abdomen revealed acute appendicitis.  Underwent laparoscopic which was converted to open appendectomy 2/21 secondary acute perforated appendicitis.  Remained intubated post procedure until 2/22. Pt with significant confusion since surgery; MRI negative for acute findings.  Atrial fibrillation with RVR and delerium post-op.  PMH includes: a-fib, AAA, vertigo, COPD & RA.     PT Comments    Pt still with some confusion, but clearly more alert and able to participate than most of this hospitalization.  She did surprisingly well with supine exercises and general LE movement in the bed, but was very weak when it came to actual functional tasks (bed mobility, getting to standing).  She was unable to take any real steps, but with heavy assist and cuing was able to manage a few shuffling side steps along EOB.  Pt showed good effort to/ the session but remains very functionally limited.  Continues to require STR on d/c.   Follow Up Recommendations  SNF     Equipment Recommendations  Rolling walker with 5" wheels    Recommendations for Other Services       Precautions / Restrictions Precautions Precautions: Fall Restrictions Weight Bearing Restrictions: No    Mobility  Bed Mobility Overal bed mobility: Needs Assistance Bed Mobility: Supine to Sit;Sit to Supine     Supine to sit: Min assist;Mod assist Sit to supine: Max assist   General bed mobility comments: Pt showed ability to start bringing LEs toward EOB, but needed assist to actually attain sitting   Transfers Overall transfer level: Needs assistance Equipment used: Rolling walker (2 wheeled) Transfers: Sit to/from Stand Sit to Stand: Mod assist;Max assist         General transfer comment: 3 standing attempts.   Pt unable to attain upright from standard height bed and mod assist, on the next 2 attempts from 2-3" elevated bed and mod/max assist she was able to attain standing with great effort and some obvious anxiety but did not maintain very long.  She did manage to get over top walker enough on last effort to do some side shuffling steps but needed considerable assist and cuing.    Ambulation/Gait             General Gait Details: Pt unable to take a forward step but with heavy walker use and mod assist to unweight and guide her she mananged a few side steps along EOB.   Stairs             Wheelchair Mobility    Modified Rankin (Stroke Patients Only)       Balance Overall balance assessment: Needs assistance Sitting-balance support: Bilateral upper extremity supported Sitting balance-Leahy Scale: Fair     Standing balance support: Bilateral upper extremity supported Standing balance-Leahy Scale: Poor Standing balance comment: needed heavy assist to use/position walker properly                             Cognition Arousal/Alertness: Awake/alert Behavior During Therapy: Anxious Overall Cognitive Status: Difficult to assess                                 General Comments: Pt with some mild confusion,  but able to hold appropriate elemetary conversation with      Exercises Total Joint Exercises Ankle Circles/Pumps: AROM;10 reps Short Arc Quad: Strengthening;AROM;10 reps Heel Slides: AROM;Strengthening;10 reps(resisted leg extensions) Hip ABduction/ADduction: AROM;Strengthening;10 reps Straight Leg Raises: AAROM;10 reps    General Comments        Pertinent Vitals/Pain Pain Assessment: No/denies pain    Home Living                      Prior Function            PT Goals (current goals can now be found in the care plan section) Progress towards PT goals: Progressing toward goals    Frequency    Min 2X/week      PT  Plan Current plan remains appropriate    Co-evaluation              AM-PAC PT "6 Clicks" Mobility   Outcome Measure  Help needed turning from your back to your side while in a flat bed without using bedrails?: A Lot Help needed moving from lying on your back to sitting on the side of a flat bed without using bedrails?: A Lot Help needed moving to and from a bed to a chair (including a wheelchair)?: Total Help needed standing up from a chair using your arms (e.g., wheelchair or bedside chair)?: Total Help needed to walk in hospital room?: Total Help needed climbing 3-5 steps with a railing? : Total 6 Click Score: 8    End of Session Equipment Utilized During Treatment: Gait belt Activity Tolerance: Patient limited by fatigue Patient left: in bed;with call bell/phone within reach;with bed alarm set Nurse Communication: Mobility status PT Visit Diagnosis: Other abnormalities of gait and mobility (R26.89);Muscle weakness (generalized) (M62.81);Difficulty in walking, not elsewhere classified (R26.2)     Time: 1561-5379 PT Time Calculation (min) (ACUTE ONLY): 28 min  Charges:  $Therapeutic Exercise: 8-22 mins $Therapeutic Activity: 8-22 mins                     Kreg Shropshire, DPT 03/05/2019, 12:30 PM

## 2019-03-05 NOTE — Clinical Social Work Note (Signed)
Continuing to wait on medicare uhc authorization for patient to go to [facility].  Shela Leff MSW,LCSW [phone]

## 2019-03-05 NOTE — Care Management Important Message (Signed)
Copy of signed Medicare IM left with patient in room. 

## 2019-03-05 NOTE — Clinical Social Work Note (Signed)
Late entry. CSW spoke with patient yesterday afternoon, 3/6, and patient still hesitant to go to a short term rehab facility but states out of the two bed offers, she would pick [facility]. CSW contacted Magda Paganini and informed her to begin [place] with [employer]. Shela Leff MSW,LCSW [phone]

## 2019-03-05 NOTE — Progress Notes (Signed)
Subjective:  CC: Brandy Armstrong is a 73 y.o. female  Hospital stay day 43, 14 Days Post-Op laparoscopic converted to open appendectomy, complicated by pertinent comorbidities includingatrial fibrillation on anticoagulation, COPD, rheumatoid arthritis.  HPI: No issues overnight. Having BMs, tolerating diet.  ROS:  General: Denies weight loss, weight gain, fatigue, fevers, chills, and night sweats. Heart: Denies chest pain, palpitations, racing heart, irregular heartbeat, leg pain or swelling, and decreased activity tolerance. Respiratory: Denies breathing difficulty, shortness of breath, wheezing, cough, and sputum. GI: Denies change in appetite, heartburn, nausea, vomiting, constipation, diarrhea, and blood in stool. GU: Denies difficulty urinating, pain with urinating, urgency, frequency, blood in urine.   Objective:   Temp:  [98.4 F (36.9 C)-99.1 F (37.3 C)] 98.4 F (36.9 C) (03/06 1100) Pulse Rate:  [100-104] 103 (03/06 1100) Resp:  [16-18] 18 (03/06 1100) BP: (131-154)/(77-89) 141/77 (03/06 1100) SpO2:  [95 %-96 %] 96 % (03/06 1100) Weight:  [99.9 kg] 99.9 kg (03/06 0500)     Height: 5\' 6"  (167.6 cm) Weight: 99.9 kg BMI (Calculated): 35.56   Intake/Output this shift:   Intake/Output Summary (Last 24 hours) at 03/05/2019 1515 Last data filed at 03/05/2019 1007 Gross per 24 hour  Intake 120 ml  Output 650 ml  Net -530 ml    Constitutional :  alert, cooperative and appears stated age  Gastrointestinal: soft, no guarding, minimal tenderness around incision site..   Skin: Cool and moist. Incisions c/d/i.  Psychiatric: Normal affect, non-agitated, not confused       LABS:  CMP Latest Ref Rng & Units 03/03/2019 03/02/2019 03/01/2019  Glucose 70 - 99 mg/dL 88 111(H) 118(H)  BUN 8 - 23 mg/dL 8 9 7(L)  Creatinine 0.44 - 1.00 mg/dL 0.56 0.69 0.52  Sodium 135 - 145 mmol/L 138 137 136  Potassium 3.5 - 5.1 mmol/L 3.6 4.1 3.3(L)  Chloride 98 - 111 mmol/L 107 105 105  CO2 22 - 32  mmol/L 23 24 23   Calcium 8.9 - 10.3 mg/dL 8.6(L) 8.9 8.3(L)  Total Protein 6.5 - 8.1 g/dL - - -  Total Bilirubin 0.3 - 1.2 mg/dL - - -  Alkaline Phos 38 - 126 U/L - - -  AST 15 - 41 U/L - - -  ALT 0 - 44 U/L - - -   CBC Latest Ref Rng & Units 03/03/2019 03/02/2019 02/28/2019  WBC 4.0 - 10.5 K/uL 5.5 6.4 8.1  Hemoglobin 12.0 - 15.0 g/dL 10.3(L) 12.0 12.2  Hematocrit 36.0 - 46.0 % 33.8(L) 38.9 38.8  Platelets 150 - 400 K/uL 202 219 221    RADS: n/a Assessment:   laparoscopic converted to open appendectomy, complicated by pertinent comorbidities includingatrial fibrillation on anticoagulation, COPD, rheumatoid arthritis.  Stable.  Pending SNF placement.  OK to d/c from surgery standpoint once arrangements are made.

## 2019-03-06 LAB — CBC
HCT: 35.7 % — ABNORMAL LOW (ref 36.0–46.0)
Hemoglobin: 11.2 g/dL — ABNORMAL LOW (ref 12.0–15.0)
MCH: 27.7 pg (ref 26.0–34.0)
MCHC: 31.4 g/dL (ref 30.0–36.0)
MCV: 88.4 fL (ref 80.0–100.0)
Platelets: 244 K/uL (ref 150–400)
RBC: 4.04 MIL/uL (ref 3.87–5.11)
RDW: 14.9 % (ref 11.5–15.5)
WBC: 5.7 K/uL (ref 4.0–10.5)
nRBC: 0 % (ref 0.0–0.2)

## 2019-03-06 LAB — GLUCOSE, CAPILLARY
Glucose-Capillary: 83 mg/dL (ref 70–99)
Glucose-Capillary: 93 mg/dL (ref 70–99)
Glucose-Capillary: 85 mg/dL (ref 70–99)

## 2019-03-06 NOTE — Progress Notes (Signed)
Subjective:  CC: Brandy Armstrong is a 73 y.o. female  Hospital stay day 51, 15 Days Post-Op laparoscopic converted to open appendectomy, complicated by pertinent comorbidities includingatrial fibrillation on anticoagulation, COPD, rheumatoid arthritis.  HPI: No issues overnight. Having BMs, tolerating diet.  ROS:  General: Denies weight loss, weight gain, fatigue, fevers, chills, and night sweats. Heart: Denies chest pain, palpitations, racing heart, irregular heartbeat, leg pain or swelling, and decreased activity tolerance. Respiratory: Denies breathing difficulty, shortness of breath, wheezing, cough, and sputum. GI: Denies change in appetite, heartburn, nausea, vomiting, constipation, diarrhea, and blood in stool. GU: Denies difficulty urinating, pain with urinating, urgency, frequency, blood in urine.   Objective:   Temp:  [97.4 F (36.3 C)-98.6 F (37 C)] 97.9 F (36.6 C) (03/07 1207) Pulse Rate:  [77-101] 77 (03/07 1207) Resp:  [16-20] 16 (03/07 1207) BP: (132-148)/(77-94) 132/90 (03/07 1207) SpO2:  [93 %-96 %] 96 % (03/07 1207) Weight:  [96.5 kg] 96.5 kg (03/07 0528)     Height: 5\' 6"  (167.6 cm) Weight: 96.5 kg BMI (Calculated): 34.35   Intake/Output this shift:   Intake/Output Summary (Last 24 hours) at 03/06/2019 1403 Last data filed at 03/06/2019 1054 Gross per 24 hour  Intake 0 ml  Output 100 ml  Net -100 ml    Constitutional :  alert, cooperative and appears stated age  Gastrointestinal: soft, no guarding, minimal tenderness around incision site..   Skin: Cool and moist. Incisions c/d/i.  Psychiatric: Normal affect, non-agitated, not confused       LABS:  CMP Latest Ref Rng & Units 03/03/2019 03/02/2019 03/01/2019  Glucose 70 - 99 mg/dL 88 111(H) 118(H)  BUN 8 - 23 mg/dL 8 9 7(L)  Creatinine 0.44 - 1.00 mg/dL 0.56 0.69 0.52  Sodium 135 - 145 mmol/L 138 137 136  Potassium 3.5 - 5.1 mmol/L 3.6 4.1 3.3(L)  Chloride 98 - 111 mmol/L 107 105 105  CO2 22 - 32 mmol/L 23  24 23   Calcium 8.9 - 10.3 mg/dL 8.6(L) 8.9 8.3(L)  Total Protein 6.5 - 8.1 g/dL - - -  Total Bilirubin 0.3 - 1.2 mg/dL - - -  Alkaline Phos 38 - 126 U/L - - -  AST 15 - 41 U/L - - -  ALT 0 - 44 U/L - - -   CBC Latest Ref Rng & Units 03/06/2019 03/03/2019 03/02/2019  WBC 4.0 - 10.5 K/uL 5.7 5.5 6.4  Hemoglobin 12.0 - 15.0 g/dL 11.2(L) 10.3(L) 12.0  Hematocrit 36.0 - 46.0 % 35.7(L) 33.8(L) 38.9  Platelets 150 - 400 K/uL 244 202 219    RADS: n/a Assessment:   laparoscopic converted to open appendectomy, complicated by pertinent comorbidities includingatrial fibrillation on anticoagulation, COPD, rheumatoid arthritis.  Couple staples removed from superior aspect, and noted to have some serous drainage, with small opening at superior border.  Will keep the remaining staples in place for couple more days pending how wound continues to heal.  Staill waiting SNF placement approval per insuracne.  OK to d/c from surgery standpoint once arrangements are made and staples can be removed at f/u in office.

## 2019-03-07 NOTE — Progress Notes (Signed)
Subjective:  CC: Brandy Armstrong is a 73 y.o. female  Hospital stay day 16, 16 Days Post-Op laparoscopic converted to open appendectomy, complicated by pertinent comorbidities includingatrial fibrillation on anticoagulation, COPD, rheumatoid arthritis.  HPI: No issues overnight.   ROS:  General: Denies weight loss, weight gain, fatigue, fevers, chills, and night sweats. Heart: Denies chest pain, palpitations, racing heart, irregular heartbeat, leg pain or swelling, and decreased activity tolerance. Respiratory: Denies breathing difficulty, shortness of breath, wheezing, cough, and sputum. GI: Denies change in appetite, heartburn, nausea, vomiting, constipation, diarrhea, and blood in stool. GU: Denies difficulty urinating, pain with urinating, urgency, frequency, blood in urine.   Objective:   Temp:  [98.5 F (36.9 C)-98.6 F (37 C)] 98.6 F (37 C) (03/08 1153) Pulse Rate:  [93-103] 94 (03/08 1153) Resp:  [17-20] 17 (03/08 1153) BP: (111-131)/(80-86) 111/86 (03/08 1153) SpO2:  [93 %-97 %] 97 % (03/08 1153) FiO2 (%):  [28 %] 28 % (03/07 1942) Weight:  [95 kg] 95 kg (03/08 0500)     Height: 5\' 6"  (167.6 cm) Weight: 95 kg BMI (Calculated): 33.82   Intake/Output this shift:   Intake/Output Summary (Last 24 hours) at 03/07/2019 1308 Last data filed at 03/07/2019 1017 Gross per 24 hour  Intake 240 ml  Output 725 ml  Net -485 ml    Constitutional :  alert, cooperative and appears stated age  Gastrointestinal: soft, no guarding, minimal tenderness around incision site..   Skin: Cool and moist. Incision c/d/i. No drainage from superior portion  Psychiatric: Normal affect, non-agitated, not confused       LABS:  CMP Latest Ref Rng & Units 03/03/2019 03/02/2019 03/01/2019  Glucose 70 - 99 mg/dL 88 111(H) 118(H)  BUN 8 - 23 mg/dL 8 9 7(L)  Creatinine 0.44 - 1.00 mg/dL 0.56 0.69 0.52  Sodium 135 - 145 mmol/L 138 137 136  Potassium 3.5 - 5.1 mmol/L 3.6 4.1 3.3(L)  Chloride 98 - 111 mmol/L  107 105 105  CO2 22 - 32 mmol/L 23 24 23   Calcium 8.9 - 10.3 mg/dL 8.6(L) 8.9 8.3(L)  Total Protein 6.5 - 8.1 g/dL - - -  Total Bilirubin 0.3 - 1.2 mg/dL - - -  Alkaline Phos 38 - 126 U/L - - -  AST 15 - 41 U/L - - -  ALT 0 - 44 U/L - - -   CBC Latest Ref Rng & Units 03/06/2019 03/03/2019 03/02/2019  WBC 4.0 - 10.5 K/uL 5.7 5.5 6.4  Hemoglobin 12.0 - 15.0 g/dL 11.2(L) 10.3(L) 12.0  Hematocrit 36.0 - 46.0 % 35.7(L) 33.8(L) 38.9  Platelets 150 - 400 K/uL 244 202 219    RADS: n/a Assessment:   laparoscopic converted to open appendectomy, complicated by pertinent comorbidities includingatrial fibrillation on anticoagulation, COPD, rheumatoid arthritis.  Superior edge of incision with no further drainage, only superificial opening now.  Will keep the remaining staples in place for one more day pending how wound continues to heal.  Staill waiting SNF placement approval per insuracne.  OK to d/c from surgery standpoint once arrangements are made and staples can be removed at f/u in office.  Encouraged patient to get OOB to chair and ambulate with assistance

## 2019-03-07 NOTE — Plan of Care (Signed)
  Problem: Education: Goal: Knowledge of disease or condition will improve Outcome: Progressing   Problem: Clinical Measurements: Goal: Respiratory complications will improve Outcome: Progressing   Problem: Coping: Goal: Level of anxiety will decrease Outcome: Progressing

## 2019-03-08 DIAGNOSIS — I5032 Chronic diastolic (congestive) heart failure: Secondary | ICD-10-CM | POA: Diagnosis not present

## 2019-03-08 DIAGNOSIS — Z87891 Personal history of nicotine dependence: Secondary | ICD-10-CM | POA: Diagnosis not present

## 2019-03-08 DIAGNOSIS — G629 Polyneuropathy, unspecified: Secondary | ICD-10-CM | POA: Diagnosis not present

## 2019-03-08 DIAGNOSIS — Z7401 Bed confinement status: Secondary | ICD-10-CM | POA: Diagnosis not present

## 2019-03-08 DIAGNOSIS — I110 Hypertensive heart disease with heart failure: Secondary | ICD-10-CM | POA: Diagnosis not present

## 2019-03-08 DIAGNOSIS — I482 Chronic atrial fibrillation, unspecified: Secondary | ICD-10-CM | POA: Diagnosis not present

## 2019-03-08 DIAGNOSIS — M5489 Other dorsalgia: Secondary | ICD-10-CM | POA: Diagnosis not present

## 2019-03-08 DIAGNOSIS — M1711 Unilateral primary osteoarthritis, right knee: Secondary | ICD-10-CM | POA: Diagnosis not present

## 2019-03-08 DIAGNOSIS — K3532 Acute appendicitis with perforation and localized peritonitis, without abscess: Secondary | ICD-10-CM | POA: Diagnosis not present

## 2019-03-08 DIAGNOSIS — I4891 Unspecified atrial fibrillation: Secondary | ICD-10-CM | POA: Diagnosis not present

## 2019-03-08 DIAGNOSIS — M059 Rheumatoid arthritis with rheumatoid factor, unspecified: Secondary | ICD-10-CM | POA: Diagnosis not present

## 2019-03-08 DIAGNOSIS — M069 Rheumatoid arthritis, unspecified: Secondary | ICD-10-CM | POA: Diagnosis not present

## 2019-03-08 DIAGNOSIS — E559 Vitamin D deficiency, unspecified: Secondary | ICD-10-CM | POA: Diagnosis not present

## 2019-03-08 DIAGNOSIS — J449 Chronic obstructive pulmonary disease, unspecified: Secondary | ICD-10-CM | POA: Diagnosis not present

## 2019-03-08 DIAGNOSIS — K219 Gastro-esophageal reflux disease without esophagitis: Secondary | ICD-10-CM | POA: Diagnosis not present

## 2019-03-08 DIAGNOSIS — M2550 Pain in unspecified joint: Secondary | ICD-10-CM | POA: Diagnosis not present

## 2019-03-08 DIAGNOSIS — R10817 Generalized abdominal tenderness: Secondary | ICD-10-CM | POA: Diagnosis not present

## 2019-03-08 DIAGNOSIS — Z85038 Personal history of other malignant neoplasm of large intestine: Secondary | ICD-10-CM | POA: Diagnosis not present

## 2019-03-08 DIAGNOSIS — Z9089 Acquired absence of other organs: Secondary | ICD-10-CM | POA: Diagnosis not present

## 2019-03-08 DIAGNOSIS — Z7951 Long term (current) use of inhaled steroids: Secondary | ICD-10-CM | POA: Diagnosis not present

## 2019-03-08 DIAGNOSIS — D649 Anemia, unspecified: Secondary | ICD-10-CM | POA: Diagnosis not present

## 2019-03-08 DIAGNOSIS — Z48815 Encounter for surgical aftercare following surgery on the digestive system: Secondary | ICD-10-CM | POA: Diagnosis not present

## 2019-03-08 DIAGNOSIS — K2270 Barrett's esophagus without dysplasia: Secondary | ICD-10-CM | POA: Diagnosis not present

## 2019-03-08 MED ORDER — HYDROCODONE-ACETAMINOPHEN 5-325 MG PO TABS: 1.0000 | ORAL_TABLET | ORAL | 0 refills | Status: AC | PRN

## 2019-03-08 NOTE — Discharge Instructions (Signed)
°  Diet: Resume home heart healthy regular diet.   Activity: No heavy lifting >20 pounds (children, pets, laundry, garbage) or strenuous activity until follow-up, but light activity physical therapy is encouraged.  Wound care: May shower with soapy water and pat dry (do not rub incisions), but no baths or submerging incision underwater until follow-up. (no swimming)   Place dressing on upper portion of the wound as needed.   Medications: Resume all home medications. For mild to moderate pain: acetaminophen (Tylenol) or ibuprofen (if no kidney disease). Combining Tylenol with alcohol can substantially increase your risk of causing liver disease. Narcotic pain medications, if prescribed, can be used for severe pain, though may cause nausea, constipation, and drowsiness. Do not combine Tylenol and Norco within a 6 hour period as Norco contains Tylenol. If you do not need the narcotic pain medication, you do not need to fill the prescription.  Call office [phone]) at any time if any questions, worsening pain, fevers/chills, bleeding, drainage from incision site, or other concerns.

## 2019-03-08 NOTE — Progress Notes (Signed)
Patient discharged via EMS at 20:30. Belongings with patient on EMS stretcher. Brandy Armstrong

## 2019-03-08 NOTE — Discharge Summary (Signed)
Patient ID: Brandy Armstrong MRN: [id number] DOB/AGE: [birth year] 73 y.o.  Admit date: 02/19/2019 Discharge date: 03/08/2019   Discharge Diagnoses:  Active Problems:   Atrial fibrillation with rapid ventricular response (HCC)   Acute appendicitis with localized peritonitis   Acute appendicitis   Procedures: Laparoscopic converted to open cholecystectomy  Hospital Course:   Patient was initially admitted with acute appendicitis.  Due to recent intake of therapeutic anticoagulation the night before and no sign of perforation patient was started with IV antibiotic therapy.  During the day there was clinical deterioration with fever and altered mental status and worsening abdominal pain.  It was decided to take patient to the OR for appendectomy.  I started the procedure laparoscopically and due to the difficulty of the case and the perforation of the appendix it was converted to open appendectomy.  Postop patient was sent to ICU due to respiratory failure or mechanical ventilation.  Patient was extubated the next day.  She developed severe encephalopathy for around 4 to 5 days.  MRI of the head was negative.  Electrolytes and hemoglobin was within normal limits.  It was difficult to control her heart rate for the first week after surgery with multiple therapies and drips.  Patient was on therapeutic anticoagulation without any sign of bleeding.  Finally the encephalopathy improved and resolved and now her heart rate is in an adequate range.  She has been followed by cardiology who gave the recommendation for long-term heart rate control therapy.  Cardiology signing off of case.  Patient is currently on Eliquis, diltiazem and metoprolol.  Patient is tolerating regular diet. Patient is getting stronger and having physical therapy.  Patient will need skilled nursing facility to continue rehab.  Patient completed antibiotic therapy.    Consults: Critical Care physician                   Cardiology             Physical therapy   Disposition: Discharge disposition: 03-Skilled Nursing Facility      Discharge Instructions    Diet - low sodium heart healthy   Complete by:  As directed    Increase activity slowly   Complete by:  As directed      Allergies as of 03/08/2019      Reactions   Latex    Itching Itching      Medication List    TAKE these medications   albuterol 108 (90 Base) MCG/ACT inhaler Commonly known as:  PROVENTIL HFA;VENTOLIN HFA Inhale 1-2 puffs into the lungs every 4 (four) hours as needed for wheezing or shortness of breath.   Eliquis 5 MG Tabs tablet Generic drug:  apixaban Take 1 tablet (5 mg total) by mouth 2 (two) times daily.   esomeprazole 20 MG capsule Commonly known as:  NEXIUM Take 20 mg by mouth daily at 12 noon.   Fluticasone-Umeclidin-Vilant 100-62.5-25 MCG/INH Aepb Commonly known as:  Trelegy Ellipta Inhale 1 puff into the lungs daily.   furosemide 20 MG tablet Commonly known as:  LASIX Take 1 tablet (20 mg total) by mouth daily as needed.   gabapentin 300 MG capsule Commonly known as:  NEURONTIN Take 1 capsule (300 mg total) by mouth 3 (three) times daily.   Humira Pen 40 MG/0.4ML Pnkt Generic drug:  Adalimumab Inject 40 mg as directed. Every 2 weeks   HYDROcodone-acetaminophen 5-325 MG tablet Commonly known as:  NORCO/VICODIN Take 1 tablet by mouth every 4 (four) hours as  needed for up to 5 days for moderate pain.   hydroxychloroquine 200 MG tablet Commonly known as:  PLAQUENIL Take 200 mg by mouth 2 (two) times daily.   ibuprofen 400 MG tablet Commonly known as:  ADVIL,MOTRIN Take 400 mg by mouth every 4 (four) hours as needed.   lidocaine 5 % ointment Commonly known as:  XYLOCAINE Apply 1 application topically as needed.   potassium chloride 10 MEQ tablet Commonly known as:  K-DUR Take 1 tablet (10 mEq total) by mouth daily as needed.   propranolol ER 80 MG 24 hr capsule Commonly known as:  INDERAL LA Take 1  capsule (80 mg total) by mouth daily.   rosuvastatin 10 MG tablet Commonly known as:  Crestor Take 1 tablet (10 mg total) by mouth daily.   traMADol 50 MG tablet Commonly known as:  ULTRAM Take 1 tablet (50 mg total) by mouth every 12 (twelve) hours as needed for severe pain.

## 2019-03-08 NOTE — Clinical Social Work Note (Signed)
Patient is medically ready for discharge today to [facility]. CSW notified patient of discharge today. CSW also notified Magda Paganini at [facility] of discharge today. Patient will be transported by EMS. RN to call report and call for transport.   [place], [place]

## 2019-03-08 NOTE — Clinical Social Work Note (Signed)
[employer] Josem Kaufmann has been received: [id number] from [facility]. Patient can discharge when time to [facility].

## 2019-03-08 NOTE — Progress Notes (Signed)
Nutrition Follow Up Note   DOCUMENTATION CODES:   Obesity unspecified  INTERVENTION:   Ensure Enlive po BID, each supplement provides 350 kcal and 20 grams of protein  Recommend MVI daily   NUTRITION DIAGNOSIS:   Inadequate oral intake related to inability to eat as evidenced by NPO status - pt advanced to regular diet now   GOAL:   Patient will meet greater than or equal to 90% of their needs  -progressing   MONITOR:   PO intake, Supplement acceptance, Labs, Weight trends, Skin, I & O's  ASSESSMENT:   73 y.o. female with acute perforated appendicitis s/p laparoscopic converted to open appendectomy 6/28, complicated by pertinent comorbidities including atrial fibrillation on anticoagulation, COPD, rheumatoid arthritis.   Pt advanced to regular diet on 3/4; pt eating 50-100% pf meals and drinking Ensure. Per chart, pt with 16lb(7%) weight loss since admit; this is significant. Recommend continue supplements. Pt to discharge to [facility] when medically ready.   Medications reviewed and include: pepcid  Labs reviewed: none recent   Diet Order:   Diet Order            Diet regular Room service appropriate? Yes; Fluid consistency: Thin  Diet effective now             EDUCATION NEEDS:   No education needs have been identified at this time  Skin:  Skin Assessment: Reviewed RN Assessment(incision abdomen )  Last BM:  3/7- TYPE 6  Height:   Ht Readings from Last 1 Encounters:  02/22/19 5\' 6"  (1.676 m)    Weight:   Wt Readings from Last 1 Encounters:  03/08/19 92.7 kg    Ideal Body Weght:  59 kg  BMI:  Body mass index is 32.99 kg/m.  Estimated Nutritional Needs:   Kcal:  1800-2100kcal/day   Protein:  100-110g/day   Fluid:  1.5L/day   Koleen Distance MS, RD, LDN Pager #- [phone] Office#- [phone] After Hours Pager: [phone]

## 2019-03-08 NOTE — Care Management Important Message (Signed)
Copy of signed Medicare IM left with patient in room. 

## 2019-03-08 NOTE — Progress Notes (Signed)
Occupational Therapy Treatment Patient Details Name: Brandy Armstrong MRN: [id number] DOB: 1946-06-11 Today's Date: 03/08/2019    History of present illness 73 y.o. female presented to [facility] ED 2/21 with abdominal pain.  CT of abdomen revealed acute appendicitis.  Underwent laparoscopic which was converted to open appendectomy 2/21 secondary acute perforated appendicitis.  Remained intubated post procedure until 2/22. Pt with significant confusion since surgery; MRI negative for acute findings.  Atrial fibrillation with RVR and delerium post-op.  PMH includes: a-fib, AAA, vertigo, COPD & RA.    OT comments  Pt seen for OT treatment on this date. Upon arrival to room pt awake/alert semi-reclined in bed. Pt agreeable to tx. Pt A&O to self, place, and date. She continues to endorse having difficulty with her memory and stated she doesn't, "remember a single thing" about the day she came to the hospital. Pt demonstrated continued cognitive deficits t/o session including having difficulty with word finding, attending to tasks, and following verbal prompts. Pt also appeared anxious about potential transfer to STR. OT provided active listening and reassurance on this date. Pt assisted with EOB grooming and UB bathing; required min/mod A for ADL assistance. Pt eager to look through a bag of her belongings on this date. OT assisted pt with retrieving bag. Pt located her cell phone and charger in the bag. OT assisted pt with plugging charger into her bed so she could charge her phone. Pt was able to get EOB with min assist, but continues to require max assist for sit>sup transfer. Pt unable to complete lateral scoot to reposition in bed (attempt x3 on this date). Pt making good progress toward goals and continues to benefit from skilled OT services to maximize return to PLOF and minimize risk of future falls, injury, and readmission. Will continue to follow POC. Discharge recommendation remains appropriate.    Follow Up  Recommendations  SNF;Supervision/Assistance - 24 hour    Equipment Recommendations  (TBD at next venue of care.)    Recommendations for Other Services      Precautions / Restrictions Precautions Precautions: Fall Precaution Comments: latex allergy Restrictions Weight Bearing Restrictions: No       Mobility Bed Mobility Overal bed mobility: Needs Assistance Bed Mobility: Supine to Sit;Sit to Supine     Supine to sit: Min assist;HOB elevated Sit to supine: Max assist   General bed mobility comments: Pt able to maintain seated position at EOB with HOB elevated and increased time for transfer. Pt continues to require max assistance with sit>sup transfer.  Transfers                      Balance Overall balance assessment: Needs assistance Sitting-balance support: Feet supported;Single extremity supported Sitting balance-Leahy Scale: Fair Sitting balance - Comments: Pt able to maintain sitting balance with single extremity supported and occasional leaning against HOB. Pt able to complete functional task while seated with supervision to maintain balance.                                    ADL either performed or assessed with clinical judgement   ADL Overall ADL's : Needs assistance/impaired Eating/Feeding: Set up;Sitting;Supervision/ safety   Grooming: Set up;Minimal assistance;Sitting Grooming Details (indicate cue type and reason): Pt with some difficulty maintaining seated balance EOB for grooming activity. Required min A and VCs for sequencing. Better attention to task from previous sessions. Upper Body Bathing:  Set up;Moderate assistance;Sitting Upper Body Bathing Details (indicate cue type and reason): Pt required min/mod assist with upper body bathing on this date. Pt unable to reach back; required assist. Pt able to use washcloth on face, head, and arms independently. Required VCs for sequencing throughout task.                             General ADL Comments: Pt with increased strength on this date. Able to come EOB with min assist (+1) and remained seated EOB for ~10 minutes without LOB. Pt endorsed slight dizziness which improved with rest breaks. Pt continues to have difficulty with sit>sup transfers and required mod/max A when getting back in bed.      Vision Baseline Vision/History: Wears glasses Wears Glasses: Reading only Patient Visual Report: No change from baseline     Perception     Praxis      Cognition Arousal/Alertness: Awake/alert Behavior During Therapy: Anxious;WFL for tasks assessed/performed Overall Cognitive Status: Within Functional Limits for tasks assessed                                 General Comments: Pt with some mild confusion, but able to hold appropriate conversation. Pt anxious about going to STR. OT provided active listening and reassurance. Pt able to attend to tasks with reassurance and redirection when appropriate.         Exercises Other Exercises Other Exercises: Pt provided with reinforcement of education in bed mobility technique on this date. Pt able to complete sup<>sit transfer with therapist assistance. Other Exercises: Assisted pt with grooming/UB bathing ADL tasks EOB.    Shoulder Instructions       General Comments      Pertinent Vitals/ Pain       Pain Score: 5  Pain Location: Pt endorsed abdominal pain with activity on this date.  Pain Intervention(s): Limited activity within patient's tolerance;Monitored during session;Repositioned  Home Living Family/patient expects to be discharged to:: Assisted living Living Arrangements: Other (Comment)(Independent living) Available Help at Discharge: Family;Available PRN/intermittently Type of Home: Apartment Home Access: Level entry                                Prior Functioning/Environment              Frequency  Min 1X/week        Progress Toward Goals  OT  Goals(current goals can now be found in the care plan section)  Progress towards OT goals: Progressing toward goals  Acute Rehab OT Goals Patient Stated Goal: To get memory back OT Goal Formulation: With patient Time For Goal Achievement: 03/15/19 Potential to Achieve Goals: Good  Plan Discharge plan remains appropriate;Frequency remains appropriate    Co-evaluation                 AM-PAC OT "6 Clicks" Daily Activity     Outcome Measure   Help from another person eating meals?: A Little Help from another person taking care of personal grooming?: A Little Help from another person toileting, which includes using toliet, bedpan, or urinal?: A Lot Help from another person bathing (including washing, rinsing, drying)?: A Lot Help from another person to put on and taking off regular upper body clothing?: A Little Help from another person to put on and taking off  regular lower body clothing?: A Lot 6 Click Score: 15    End of Session Equipment Utilized During Treatment: Gait belt  OT Visit Diagnosis: Other abnormalities of gait and mobility (R26.89);Muscle weakness (generalized) (M62.81);Unsteadiness on feet (R26.81);Other symptoms and signs involving cognitive function   Activity Tolerance Patient tolerated treatment well   Patient Left in bed;with bed alarm set;with call bell/phone within reach;with nursing/sitter in room   Nurse Communication          Time: 9249-3241 OT Time Calculation (min): 39 min  Charges: OT General Charges $OT Visit: 1 Visit OT Treatments $Self Care/Home Management : 38-52 mins  Shara Blazing, M.S., OTR/L Ascom: [phone] 03/08/19, 2:43 PM

## 2019-03-08 NOTE — Progress Notes (Signed)
Report called to Levada Dy at [facility].  EMS called for transport.

## 2019-03-09 ENCOUNTER — Telehealth: Payer: Self-pay | Admitting: Family

## 2019-03-09 DIAGNOSIS — J449 Chronic obstructive pulmonary disease, unspecified: Secondary | ICD-10-CM | POA: Diagnosis not present

## 2019-03-09 DIAGNOSIS — I4891 Unspecified atrial fibrillation: Secondary | ICD-10-CM | POA: Diagnosis not present

## 2019-03-09 DIAGNOSIS — K3532 Acute appendicitis with perforation and localized peritonitis, without abscess: Secondary | ICD-10-CM | POA: Insufficient documentation

## 2019-03-09 DIAGNOSIS — M059 Rheumatoid arthritis with rheumatoid factor, unspecified: Secondary | ICD-10-CM | POA: Diagnosis not present

## 2019-03-09 NOTE — Telephone Encounter (Signed)
Patient DC to SNF [facility] .

## 2019-03-12 ENCOUNTER — Other Ambulatory Visit: Payer: Self-pay | Admitting: Family

## 2019-03-12 DIAGNOSIS — G629 Polyneuropathy, unspecified: Secondary | ICD-10-CM

## 2019-03-15 ENCOUNTER — Telehealth: Payer: Self-pay

## 2019-03-15 NOTE — Unmapped (Signed)
Hydroxychloroquine refill  Last ov: 06/22/2018  Next ov: visit date not found

## 2019-03-15 NOTE — Telephone Encounter (Signed)
Called office wanting to know whether she needs to continue her trelegy. She was recently seen in ED for her appendix. She is currently at [facility] for [facility] and they are not giving her any inhalers. Requesting a call back with recommendations.   Call back # [phone]

## 2019-03-15 NOTE — Telephone Encounter (Signed)
I spoke to patient, let her know that Dr. Alva Garnet does want her to continue the Trelegy daily and Albuterol as needed. She is aware. I suggested she contact either the head nurse or social worker for the facility to make sure she can get her meds. Per discharge summary Trelegy and Albuterol are on the list, so not sure why they are not providing for the patient.

## 2019-03-16 MED ORDER — HYDROXYCHLOROQUINE 200 MG TABLET
ORAL_TABLET | Freq: Two times a day (BID) | ORAL | 1 refills | 0.00000 days | Status: CP
Start: 2019-03-16 — End: ?

## 2019-03-29 ENCOUNTER — Telehealth: Payer: Self-pay

## 2019-03-29 ENCOUNTER — Encounter: Payer: Self-pay | Admitting: Family

## 2019-03-29 ENCOUNTER — Ambulatory Visit (INDEPENDENT_AMBULATORY_CARE_PROVIDER_SITE_OTHER): Payer: Medicare Other | Admitting: Family

## 2019-03-29 DIAGNOSIS — M1711 Unilateral primary osteoarthritis, right knee: Secondary | ICD-10-CM | POA: Diagnosis not present

## 2019-03-29 DIAGNOSIS — I5032 Chronic diastolic (congestive) heart failure: Secondary | ICD-10-CM

## 2019-03-29 DIAGNOSIS — Z48815 Encounter for surgical aftercare following surgery on the digestive system: Secondary | ICD-10-CM | POA: Diagnosis not present

## 2019-03-29 DIAGNOSIS — I4891 Unspecified atrial fibrillation: Secondary | ICD-10-CM

## 2019-03-29 DIAGNOSIS — D649 Anemia, unspecified: Secondary | ICD-10-CM | POA: Diagnosis not present

## 2019-03-29 DIAGNOSIS — M069 Rheumatoid arthritis, unspecified: Secondary | ICD-10-CM | POA: Diagnosis not present

## 2019-03-29 DIAGNOSIS — Z9181 History of falling: Secondary | ICD-10-CM | POA: Diagnosis not present

## 2019-03-29 DIAGNOSIS — K22719 Barrett's esophagus with dysplasia, unspecified: Secondary | ICD-10-CM

## 2019-03-29 DIAGNOSIS — K2270 Barrett's esophagus without dysplasia: Secondary | ICD-10-CM | POA: Diagnosis not present

## 2019-03-29 DIAGNOSIS — G629 Polyneuropathy, unspecified: Secondary | ICD-10-CM

## 2019-03-29 DIAGNOSIS — J438 Other emphysema: Secondary | ICD-10-CM | POA: Diagnosis not present

## 2019-03-29 DIAGNOSIS — Z7951 Long term (current) use of inhaled steroids: Secondary | ICD-10-CM | POA: Diagnosis not present

## 2019-03-29 DIAGNOSIS — Z85038 Personal history of other malignant neoplasm of large intestine: Secondary | ICD-10-CM | POA: Diagnosis not present

## 2019-03-29 DIAGNOSIS — J449 Chronic obstructive pulmonary disease, unspecified: Secondary | ICD-10-CM | POA: Diagnosis not present

## 2019-03-29 DIAGNOSIS — K3532 Acute appendicitis with perforation and localized peritonitis, without abscess: Secondary | ICD-10-CM

## 2019-03-29 DIAGNOSIS — Z9089 Acquired absence of other organs: Secondary | ICD-10-CM | POA: Diagnosis not present

## 2019-03-29 DIAGNOSIS — Z87891 Personal history of nicotine dependence: Secondary | ICD-10-CM | POA: Diagnosis not present

## 2019-03-29 DIAGNOSIS — I482 Chronic atrial fibrillation, unspecified: Secondary | ICD-10-CM | POA: Diagnosis not present

## 2019-03-29 DIAGNOSIS — K219 Gastro-esophageal reflux disease without esophagitis: Secondary | ICD-10-CM | POA: Diagnosis not present

## 2019-03-29 DIAGNOSIS — E559 Vitamin D deficiency, unspecified: Secondary | ICD-10-CM | POA: Diagnosis not present

## 2019-03-29 DIAGNOSIS — I110 Hypertensive heart disease with heart failure: Secondary | ICD-10-CM | POA: Diagnosis not present

## 2019-03-29 MED ORDER — ESOMEPRAZOLE MAGNESIUM 20 MG PO CPDR: 20.0000 mg | DELAYED_RELEASE_CAPSULE | Freq: Every day | ORAL | 1 refills | Status: DC

## 2019-03-29 NOTE — Telephone Encounter (Signed)
I spoke with patient & she is scheduled for webex at 3pm.

## 2019-03-29 NOTE — Progress Notes (Addendum)
This visit type was conducted due to national recommendations for restrictions regarding the COVID-19 pandemic (e.g. social distancing).  This format is felt to be most appropriate for this patient at this time.  All issues noted in this document were discussed and addressed.  No physical exam was performed (except for noted visual exam findings with Video Visits). Virtual Visit via Video Note  I connected with@ on 03/29/19 at  3:00 PM EDT by a video enabled telemedicine application and verified that I am speaking with the correct person using two identifiers.  Location patient: home Location provider:work Persons participating in the virtual visit: patient, provider  I discussed the limitations of evaluation and management by telemedicine and the availability of in person appointments. The patient expressed understanding and agreed to proceed.   HPI: Overall feels well today.  Still feels weak and is determined to walk without using her walker and also to drive again.  At home physical therapy consult today. Staples removed at Rehab by nurse; no purulent discharge from incision site.  No follow-up scheduled with surgery.  No fever. Only occasional pain over incision. No constipation, dysuria.   Atrial fibrillation-is on Eliquis, propranolol. No CP.   She is not diltiazem, metoprolol; she has not received a prescription for these.   Follows with Dr Rockey Situ  LE edema- swelling in both feet and ankle x 3 days.'Swells up around shoe.'  No orthopnea, calf pain or redness in legs. Feels like had been better in rehab since not elevating legs like had done in rehab.  Improved today from elevation.  Taking lasix 20mg  QD and potassium chloride.   Echo 01/2019 EF > 65%.  HLD - daily crestor.   Peripheral neuropathy-some breakthrough symptoms.  600mg  BID gabapentin. Not drowsy.   COPD- at baseline. SOB at baseline. started Treligy with Dr Rosita Fire and has noted real improvement since then  Low back  pain- not using tramadol very often. Uses advil more often. No ho seizure.   RA- on humira and plaquenil ; resumed since hospitalization.   Released from [facility] rehab 3 days; has been there for 3 weeks. Walking with walker all the time now. Feels strength has improved. Doing PT at home, twice per week x 3 weeks she believes.    Presented to [facility] ED for evaluation of abdominal pain.  Diagnosed with acute appendicitis performed by Dr Windell Moment.  Admitted 02/19/2019 and discharged 03/08/2019; admitted for acute appendicitis localized peritonitis.  Laparoscopic converted to open appendectomy.  Postop was in the ICU due to respiratory failure, intibated next debated the next day she developed severe encephalopathy from 4 to 5 days.  MRI of the head was negative. Sent from hospital to skilled nursing facility  Last cardiology note in hospital: No DCCV per patient. In this setting, it is reasonable to discontinue amiodarone gtt and place her on diltiazem 30 mg q 8 hours with continuation of metoprolol 25 mg q 6 hours  ROS: See pertinent positives and negatives per HPI.  Past Medical History:  Diagnosis Date  . Barretts esophagus   . Chest pain    a. 02/2014 Myoview: Ef 50%, no ischemia.  . Colon polyps   . COPD (chronic obstructive pulmonary disease) ([place])   . Depression with anxiety   . GERD (gastroesophageal reflux disease)   . Neuropathy   . Permanent atrial fibrillation    a. s/p ablation 01/2012 in [facility] by Dr. Boyd Kerbs;  b. On sotalol & Xarelto;  c. 02/2014 Echo:  EF 50-55%, mild conc LVH, nl LA size/structure;  d. Recurrent afib 8/15 & 08/29/2014.  [name] Rectal fistula   . Rheumatoid arthritis ([place])    On methotrexate and orencia  . Urinary incontinence   . Vitamin D deficiency     Past Surgical History:  Procedure Laterality Date  . ABDOMINAL HYSTERECTOMY  1992  . CARDIAC ELECTROPHYSIOLOGY STUDY AND ABLATION  2013  . CHOLECYSTECTOMY  1987  . COLONOSCOPY N/A 05/08/2015    Procedure: COLONOSCOPY;  Surgeon: Manya Silvas, MD;  Location: [facility] ENDOSCOPY;  Service: Endoscopy;  Laterality: N/A;  . ESOPHAGOGASTRODUODENOSCOPY N/A 05/06/2015   Procedure: ESOPHAGOGASTRODUODENOSCOPY (EGD);  Surgeon: Lollie Sails, MD;  Location: [facility] ENDOSCOPY;  Service: Endoscopy;  Laterality: N/A;  . LAPAROSCOPIC APPENDECTOMY N/A 02/19/2019   Procedure: APPENDECTOMY LAPAROSCOPIC converted to open appendectomy;  Surgeon: Herbert Pun, MD;  Location: [facility] ORS;  Service: General;  Laterality: N/A;  . OOPHORECTOMY    . oophrectomy Bilateral 1992    Family History  Problem Relation Age of Onset  . Depression Mother   . Pancreatic cancer Mother   . Colon cancer Father   . Breast cancer Sister 68  . Diabetes Brother   . Breast cancer Cousin   . Neuropathy Neg Hx     SOCIAL HX: former smoker   Current Outpatient Medications:  .  albuterol (PROVENTIL HFA;VENTOLIN HFA) 108 (90 Base) MCG/ACT inhaler, Inhale 1-2 puffs into the lungs every 4 (four) hours as needed for wheezing or shortness of breath., Disp: 1 Inhaler, Rfl: 10 .  clonazePAM (KLONOPIN) 0.5 MG tablet, Take 1/2 tablet daily if needed for anxiety., Disp: 15 tablet, Rfl: 0 .  diltiazem (CARDIZEM CD) 120 MG 24 hr capsule, Take 1 capsule (120 mg total) by mouth daily at 6 PM., Disp: 90 capsule, Rfl: 3 .  ELIQUIS 5 MG TABS tablet, Take 1 tablet (5 mg total) by mouth 2 (two) times daily., Disp: 180 tablet, Rfl: 3 .  esomeprazole (NEXIUM) 20 MG capsule, Take 1 capsule (20 mg total) by mouth daily., Disp: 90 capsule, Rfl: 1 .  Fluticasone-Umeclidin-Vilant (TRELEGY ELLIPTA) 100-62.5-25 MCG/INH AEPB, Inhale 1 puff into the lungs daily., Disp: 60 each, Rfl: 2 .  furosemide (LASIX) 20 MG tablet, Take 1 tablet (20 mg total) by mouth daily as needed., Disp: 90 tablet, Rfl: 2 .  gabapentin (NEURONTIN) 300 MG capsule, TAKE 1 CAPSULE BY MOUTH THREE TIMES A DAY (Patient taking differently: Take 300 mg by mouth. Take 1 capsule in  AM, 1 capsule in afternoon, and 2 capsules (600mg ) at HS), Disp: 270 capsule, Rfl: 2 .  HUMIRA PEN 40 MG/0.4ML PNKT, Inject 40 mg as directed. Every 2 weeks, Disp: , Rfl:  .  hydroxychloroquine (PLAQUENIL) 200 MG tablet, Take 200 mg by mouth 2 (two) times daily. , Disp: , Rfl:  .  ibuprofen (ADVIL,MOTRIN) 400 MG tablet, Take 400 mg by mouth every 4 (four) hours as needed. , Disp: , Rfl:  .  lidocaine (XYLOCAINE) 5 % ointment, Apply 1 application topically as needed., Disp: , Rfl:  .  potassium chloride (K-DUR) 10 MEQ tablet, Take 1 tablet (10 mEq total) by mouth daily as needed., Disp: 90 tablet, Rfl: 2 .  propranolol ER (INDERAL LA) 80 MG 24 hr capsule, Take 1 capsule (80 mg total) by mouth daily., Disp: 90 capsule, Rfl: 3 .  rosuvastatin (CRESTOR) 10 MG tablet, Take 1 tablet (10 mg total) by mouth daily., Disp: 90 tablet, Rfl: 3 .  traMADol (ULTRAM) 50 MG tablet,  Take 1 tablet (50 mg total) by mouth every 12 (twelve) hours as needed for severe pain., Disp: 30 tablet, Rfl: 0  EXAM:  VITALS per patient if applicable:  GENERAL: alert, oriented, appears well and in no acute distress  HEENT: atraumatic, conjunttiva clear, no obvious abnormalities on inspection of external nose and ears  NECK: normal movements of the head and neck  LUNGS: on inspection no signs of respiratory distress, breathing rate appears normal, no obvious gross SOB, gasping or wheezing  CV: no obvious cyanosis  MS: moves all visible extremities without noticeable abnormality  PSYCH/NEURO: pleasant and cooperative, no obvious depression or anxiety, speech and thought processing grossly intact  ASSESSMENT AND PLAN:  Discussed the following assessment and plan:  Atrial fibrillation with rapid ventricular response ([place]) - Plan: Ambulatory referral to [place]  Barrett's esophagus with dysplasia - Plan: esomeprazole (NEXIUM) 20 MG capsule  Chronic diastolic heart failure ([facility])  Neuropathy  Other emphysema  (H[facility])  Acute appendicitis with perforation, localized peritonitis, and gangrene, without abscess     Problem List Items Addressed This Visit      Cardiovascular and Mediastinum   Atrial fibrillation with rapid ventricular response ([place]) - Primary    Some confusion at discharge in regards what medication she should be on.  I have messaged Christell Faith in regards to this.  He was very polite and stated he would reach out to her      Relevant Orders   Ambulatory referral to [place]   Chronic diastolic heart failure ([place])     Improving.  Emphasized the role of elevation, low-salt.  I did reiterate to patient to aim to take the Lasix 3 days a week.  She will also follow-up with cardiology for this.        Respiratory   COPD (chronic obstructive pulmonary disease) ([place])    Following with pulmonology, pleased to hear at baseline.  Will follow        Digestive   Barrett's esophagus   Relevant Medications   esomeprazole (NEXIUM) 20 MG capsule   Acute appendicitis    Reviewed hospitalization course with patient today.  Also message patient's surgeon in regards to  follow-up.  Advised her to continue physical therapy and she will keep me informed as to how she is improving.  Also placed orders for home health today as patient is homebound and could benefit from RN as well as PT. F/u in 5 days.         Nervous and Auditory   Neuropathy    Advised to increase gabapentin to 600mg  at bedtime for neuropathy and also chronic low back pain.  Patient to monitor for excessive sedation.  Will follow.         I discussed the assessment and treatment plan with the patient. The patient was provided an opportunity to ask questions and all were answered. The patient agreed with the plan and demonstrated an understanding of the instructions.   The patient was advised to call back or seek an in-person evaluation if the symptoms worsen or if the condition fails to improve as  anticipated.     Mable Paris, FNP

## 2019-03-29 NOTE — Telephone Encounter (Signed)
Copied from [place] [phone]. Topic: General - Other >> Mar 29, 2019 10:33 AM Antonieta Iba C wrote: Reason for CRM:  pt called in to speak with PCP or CMA. Pt says that she has been released from the hospital and rehab. (pt has been there for 5 weeks) pt would like a call back: [phone]. She said that she was advised to follow up with PCP but due to everything, pt says that she really dont want to come in for ov. Pt says that she is feeling fine.

## 2019-03-29 NOTE — Patient Instructions (Addendum)
Gabapentin 300mg  in the morning and midday ; and then 600mg  at bedtime for backpain in particular  Trial of lasix 3 days per week; always take with potassium chloride 10 meq.   We will talk on Friday at 11.   See you then!

## 2019-03-29 NOTE — Telephone Encounter (Signed)
I called patient to offer webex visit & she asked if I would call back this afternoon after her physical therapy visit.

## 2019-03-30 ENCOUNTER — Telehealth: Payer: Self-pay

## 2019-03-30 NOTE — Telephone Encounter (Signed)
Call to patient at the recommendation of Christell Faith, PA following recent hospitalizations and discharge medications. Pt agreed to telephone visit with Christell Faith, PA on 03/31/19 @ 11AM. Scheduled.   I read consent to treatment verbally to patient and she agreed.   Routed to provider as Juluis Rainier.    Pt did report LE swelling. I suggested keeping legs elevated and use compression as available. She will discuss further with provider tomorrow.

## 2019-03-30 NOTE — Progress Notes (Signed)
Virtual Visit via Telephone Note    Evaluation Performed:  Follow-up visit  This visit type was conducted due to national recommendations for restrictions regarding the COVID-19 Pandemic (e.g. social distancing).  This format is felt to be most appropriate for this patient at this time.  All issues noted in this document were discussed and addressed.  No physical exam was performed (except for noted visual exam findings with Video Visits).  Please refer to the patient's chart (MyChart message for video visits and phone note for telephone visits) for the patient's consent to telehealth for [facility].  Date:  03/31/2019   ID:  Brandy Armstrong, DOB 18-Apr-1946, MRN [id number]  Patient Location:  Home  Provider location:   Home  PCP:  [name], FNP  Cardiologist:  Ida Rogue, MD  Electrophysiologist:  None   Chief Complaint:  Telehealth follow up  History of Present Illness:    Brandy Armstrong is a 73 y.o. female who presents via audio/video conferencing for a telehealth visit today.  She has history of permanent Afib on Eliquis s/p ablation in 2013 with recurrent Afib s/p repeat ablation in 01/2015 s/p DCCV in 02/2015 s/p DCCV in 08/2016 with recurrent Afib 1 month later leading to the discontinuation of amiodarone and rate control strategy since, aortic atherosclerosis, chronic diastolic CHF, GI bleed on Xarelto, COPD secondary to tobacco abuse quitting in 2012, and anemia.  She was recently admitted to the hospital from 2/21-3/9 for acute appendicitis complicated by perforation, Afib with RVR, respiratory failure requiring mechanical ventilation, and encephalopathy. She was seen by cardiology for assistance with rate control in her acute illness. Echo showed a hyperdynamic EF of > 65%, normal RVSF, no significant valvular abnormalities. Rates were difficult to control post-operatively leading to consideration of TEE/DCCV, though this was felt best to be left as a last resort given  she has not previously held sinus rhythm following cardioversion. It was ultimately recommended she be discharged on Cardizem CD 120 mg daily along with Lopressor 50 mg bid for rate control and continued on Eliquis. It appears at time of discharge on 03/08/2019, the primary service continued the patient on propranolol at time of discharge. Upon the patient seeing her PCP on 3/30, there was significant confusion regarding what medications the patient should be taking leading her to reach out to cardiology for medication review.   Patient is doing reasonably well from a cardiac perspective.  She continues to feel quite anxious following her hospital discharge.  She feels like her medications are not correct with regards to her A. fib.  She knows that she was taking metoprolol leading up to her hospital discharge though was sent home on propanolol as above.  Since her hospital discharge she has been quite weak though is beginning to increase activity as tolerated and ambulating with a walker.  Her weight has remained stable around 207 to 208 pounds which is down approximately 18 pounds when compared to her pre-hospital admission weight.  She reports overall her heart rate has been running in the 80s to 90s bpm with occasional readings in the low 100s to 1 teens bpm.  When she has heart rate readings in the low 100s to 1 teens bpm she notes associated palpitations.  She has been compliant with her Eliquis.  No falls, BRBPR, or melena.  No dizziness, presyncope, or syncope.  Following her hospital discharge she did note significant lower extremity swelling in the bilateral feet that radiated up into the  shins.  In this setting, she had been taking Lasix daily though following her WebEx visit with her PCP she has subsequently been taking Lasix every other day.  She did wear some compression hose yesterday and elevate her legs with significant improvement in swelling.  The patient does not have symptoms concerning for  COVID-19 infection (fever, chills, cough, or new shortness of breath).    Prior CV studies:   The following studies were reviewed today:  2D Echo 01/2019: 1. The left ventricle has hyperdynamic systolic function, with an ejection fraction of >65%. The cavity size was normal. There is mildly increased left ventricular wall thickness. Left ventricular diastology could not be evaluated secondary to atrial  fibrillation.  2. The right ventricle has normal systolic function. The cavity was normal. There is no increase in right ventricular wall thickness.  3. The mitral valve is normal in structure.  4. The tricuspid valve is normal in structure.  5. The aortic valve is normal in structure. Mild thickening of the aortic valve no stenosis of the aortic valve.  6. The interatrial septum was not assessed. __________  Past Medical History:  Diagnosis Date  . Barretts esophagus   . Chest pain    a. 02/2014 Myoview: Ef 50%, no ischemia.  . Colon polyps   . COPD (chronic obstructive pulmonary disease) ([place])   . Depression with anxiety   . GERD (gastroesophageal reflux disease)   . Neuropathy   . Permanent atrial fibrillation    a. s/p ablation 01/2012 in [facility] by Dr. Boyd Kerbs;  b. On sotalol & Xarelto;  c. 02/2014 Echo: EF 50-55%, mild conc LVH, nl LA size/structure;  d. Recurrent afib 8/15 & 08/29/2014.  [name] Rectal fistula   . Rheumatoid arthritis ([place])    On methotrexate and orencia  . Urinary incontinence   . Vitamin D deficiency    Past Surgical History:  Procedure Laterality Date  . ABDOMINAL HYSTERECTOMY  1992  . CARDIAC ELECTROPHYSIOLOGY STUDY AND ABLATION  2013  . CHOLECYSTECTOMY  1987  . COLONOSCOPY N/A 05/08/2015   Procedure: COLONOSCOPY;  Surgeon: Manya Silvas, MD;  Location: [facility] ENDOSCOPY;  Service: Endoscopy;  Laterality: N/A;  . ESOPHAGOGASTRODUODENOSCOPY N/A 05/06/2015   Procedure: ESOPHAGOGASTRODUODENOSCOPY (EGD);  Surgeon: Lollie Sails, MD;  Location[facility] ENDOSCOPY;   Service: Endoscopy;  Laterality: N/A;  . LAPAROSCOPIC APPENDECTOMY N/A 02/19/2019   Procedure: APPENDECTOMY LAPAROSCOPIC converted to open appendectomy;  Surgeon: Herbert Pun, MD;  Location: [facility] ORS;  Service: General;  Laterality: N/A;  . OOPHORECTOMY    . oophrectomy Bilateral 1992     Current Meds  Medication Sig  . albuterol (PROVENTIL HFA;VENTOLIN HFA) 108 (90 Base) MCG/ACT inhaler Inhale 1-2 puffs into the lungs every 4 (four) hours as needed for wheezing or shortness of breath.  Arne Cleveland 5 MG TABS tablet Take 1 tablet (5 mg total) by mouth 2 (two) times daily.  [name] esomeprazole (NEXIUM) 20 MG capsule Take 1 capsule (20 mg total) by mouth daily.  . Fluticasone-Umeclidin-Vilant (TRELEGY ELLIPTA) 100-62.5-25 MCG/INH AEPB Inhale 1 puff into the lungs daily.  . furosemide (LASIX) 20 MG tablet Take 1 tablet (20 mg total) by mouth daily as needed.  . gabapentin (NEURONTIN) 300 MG capsule TAKE 1 CAPSULE BY MOUTH THREE TIMES A DAY (Patient taking differently: Take 300 mg by mouth. Take 1 capsule in AM, 1 capsule in afternoon, and 2 capsules (600mg ) at HS)  . HUMIRA PEN 40 MG/0.4ML PNKT Inject 40 mg as directed. Every 2  weeks  . hydroxychloroquine (PLAQUENIL) 200 MG tablet Take 200 mg by mouth 2 (two) times daily.   [name] ibuprofen (ADVIL,MOTRIN) 400 MG tablet Take 400 mg by mouth every 4 (four) hours as needed.   . lidocaine (XYLOCAINE) 5 % ointment Apply 1 application topically as needed.  . potassium chloride (K-DUR) 10 MEQ tablet Take 1 tablet (10 mEq total) by mouth daily as needed.  . propranolol ER (INDERAL LA) 80 MG 24 hr capsule Take 1 capsule (80 mg total) by mouth daily.  . rosuvastatin (CRESTOR) 10 MG tablet Take 1 tablet (10 mg total) by mouth daily.  . traMADol (ULTRAM) 50 MG tablet Take 1 tablet (50 mg total) by mouth every 12 (twelve) hours as needed for severe pain.     Allergies:   Latex   Social History   Tobacco Use  . Smoking status: Former Smoker    Packs/day:  1.00    Years: 40.00    Pack years: 40.00    Last attempt to quit: 12/16/2011    Years since quitting: 7.2  . Smokeless tobacco: Never Used  Substance Use Topics  . Alcohol use: Not Currently    Alcohol/week: 0.0 standard drinks    Comment: Occasionally has a drink  . Drug use: No     Family Hx: The patient's family history includes Breast cancer in her cousin; Breast cancer (age of onset: 49) in her sister; Colon cancer in her father; Depression in her mother; Diabetes in her brother; Pancreatic cancer in her mother. There is no history of Neuropathy.  ROS:   Please see the history of present illness.     All other systems reviewed and are negative.   Labs/Other Tests and Data Reviewed:    Recent Labs: 02/19/2019: TSH 5.461 02/22/2019: ALT 9 03/02/2019: Magnesium 1.9 03/03/2019: BUN 8; Creatinine, Ser 0.56; Potassium 3.6; Sodium 138 03/06/2019: Hemoglobin 11.2; Platelets 244   Recent Lipid Panel Lab Results  Component Value Date/Time   CHOL 155 10/12/2018 10:40 AM   TRIG 3,560 (H) 02/19/2019 10:57 PM   HDL 38.90 (L) 10/12/2018 10:40 AM   CHOLHDL 4 10/12/2018 10:40 AM   LDLCALC 80 10/12/2018 10:40 AM    Wt Readings from Last 3 Encounters:  03/31/19 207 lb (93.9 kg)  03/08/19 204 lb 5.9 oz (92.7 kg)  02/10/19 221 lb 3.2 oz (100.3 kg)     Objective:    Vital Signs:  BP (!) 118/96 (BP Location: Left Arm, Patient Position: Sitting)   Pulse (!) 117   Ht 5' 6.5" (1.689 m)   Wt 207 lb (93.9 kg)   BMI 32.91 kg/m    Well nourished, well developed female in no acute distress.   ASSESSMENT & PLAN:    1.  Permanent A. fib with RVR: -Overall, she does report reasonably controlled ventricular rates in the 80s to 90s bpm with occasional readings in the low 100s to 1 teens bpm in which she is symptomatic with those readings -She reports that she has been on propanolol long-term secondary to essential tremor and peripheral neuropathy and notes this medication has helped with  these symptoms considerably -In this setting, we will not transition her from propanolol to metoprolol at this time.  However, I will start her back on Cardizem CD 120 mg daily with continuation of propanolol for added rate control -I would ideally like to see her resting ventricular rate in the 60s to 70s bpm -Continue current dose of Eliquis -No symptoms concerning for bleeding  2.  Chronic diastolic CHF: -Likely exacerbated by recent perioperative IV fluid and A. fib with RVR -Agree with PCP recommendation to take Lasix every other day in an effort to preserve renal function, especially in the current medical setting in which we are trying to avoid patient exposure by having her arrive in the medical Mall for routine venipuncture -CHF education -Daily weights  3.  COPD: -She reports this is stable -Continue current medications as directed by pulmonology  4.  Anxiety: -Patient indicates this is a significant factor for her right now -I have advised her to reach out to her PCP for further evaluation and management of this  5.  Generalized weakness: -Home health PT coming out to her house -Advised patient it will take possibly up to 12 weeks, if not longer for her to regain her strength given her prolonged hospitalization -Continue to advance activity as tolerated  COVID-19 Education: The signs and symptoms of COVID-19 were discussed with the patient and how to seek care for testing (follow up with PCP or arrange E-visit).  The importance of social distancing was discussed today.  Patient Risk:   After full review of this patient's clinical status, I feel that they are at least moderate risk at this time.  Time:   Today, I have spent 30 minutes with the patient with telehealth technology discussing the above.     Medication Adjustments/Labs and Tests Ordered: Current medicines are reviewed at length with the patient today.  Concerns regarding medicines are outlined above.  Tests  Ordered: No orders of the defined types were placed in this encounter.  Medication Changes: No orders of the defined types were placed in this encounter.   Disposition:  Follow up in 2 week(s)  Signed, Christell Faith, PA-C  03/31/2019 10:58 AM    [place]

## 2019-03-31 ENCOUNTER — Other Ambulatory Visit: Payer: Self-pay

## 2019-03-31 ENCOUNTER — Telehealth: Payer: Self-pay

## 2019-03-31 ENCOUNTER — Other Ambulatory Visit: Payer: Self-pay | Admitting: Family

## 2019-03-31 ENCOUNTER — Telehealth: Payer: Self-pay | Admitting: Family

## 2019-03-31 ENCOUNTER — Telehealth (INDEPENDENT_AMBULATORY_CARE_PROVIDER_SITE_OTHER): Payer: Medicare Other | Admitting: Physician Assistant

## 2019-03-31 ENCOUNTER — Encounter: Payer: Self-pay | Admitting: Physician Assistant

## 2019-03-31 VITALS — BP 118/96 | HR 117 | Ht 66.5 in | Wt 207.0 lb

## 2019-03-31 DIAGNOSIS — I5032 Chronic diastolic (congestive) heart failure: Secondary | ICD-10-CM | POA: Diagnosis not present

## 2019-03-31 DIAGNOSIS — I4821 Permanent atrial fibrillation: Secondary | ICD-10-CM | POA: Diagnosis not present

## 2019-03-31 DIAGNOSIS — F419 Anxiety disorder, unspecified: Secondary | ICD-10-CM

## 2019-03-31 DIAGNOSIS — I4891 Unspecified atrial fibrillation: Secondary | ICD-10-CM

## 2019-03-31 DIAGNOSIS — J438 Other emphysema: Secondary | ICD-10-CM | POA: Diagnosis not present

## 2019-03-31 DIAGNOSIS — R21 Rash and other nonspecific skin eruption: Secondary | ICD-10-CM

## 2019-03-31 DIAGNOSIS — R531 Weakness: Secondary | ICD-10-CM | POA: Diagnosis not present

## 2019-03-31 MED ORDER — DILTIAZEM HCL ER COATED BEADS 120 MG PO CP24: 120.0000 mg | ORAL_CAPSULE | Freq: Every day | ORAL | 3 refills | Status: DC

## 2019-03-31 NOTE — Telephone Encounter (Signed)
I spoke with patient & she insists that her sutures were taken out. She stated that the nurse at [facility] took them out. She said she knew this because she was not allowed to shower before this was done. She said that her PT, Costella Hatcher also looked at the incision site. She gave his number if we felt the need to call. [phone].

## 2019-03-31 NOTE — Patient Instructions (Signed)
It was a pleasure to speak with you on the phone today! Thank you for allowing [place] to continue taking care of your [facility] needs during this time.   Feel free to call as needed for questions and concerns related to your cardiac needs.   Medication Instructions:  Your physician has recommended you make the following change in your medication:  1- START Cardizem CD 1 tablet (120 mg total) once daily in the evening.   If you need a refill on your cardiac medications before your next appointment, please call your pharmacy.   Lab work: None ordered  If you have labs (blood work) drawn today and your tests are completely normal, you will receive your results only by: [name] MyChart Message (if you have MyChart) OR . A paper copy in the mail If you have any lab test that is abnormal or we need to change your treatment, we will call you to review the results.  Testing/Procedures: None ordered   Follow-Up: At [facility], you and your health needs are our priority.  As part of our continuing mission to provide you with exceptional heart care, we have created designated Provider Care Teams.  These Care Teams include your primary Cardiologist (physician) and Advanced Practice Providers (APPs -  Physician Assistants and Nurse Practitioners) who all work together to provide you with the care you need, when you need it. You will need a follow up telephone or video appointment in 2 weeks.  You may see Ida Rogue, MD or Christell Faith, PA-C.  Any Other Special Instructions Will Be Listed Below (If Applicable). 1- Please have BP readings when you have your next appointment.  How to use a home blood pressure monitor. . Be still. Don't smoke, drink caffeinated beverages or exercise within 30 minutes before measuring your blood pressure. . Sit correctly. Sit with your back straight and supported (on a dining chair, rather than a sofa). Your feet should be flat on the floor and your legs should not be crossed.  Your arm should be supported on a flat surface (such as a table) with the upper arm at heart level. Make sure the bottom of the cuff is placed directly above the bend of the elbow.  . Measure at the same time every day. It's important to take the readings at the same time each day, such as morning and evening. Take reading approximately 1 hour after BP medications.

## 2019-03-31 NOTE — Telephone Encounter (Signed)
Call pt Does she have info on her physical therapist who is coming to house?  We would like to call them and see if they can look for any sutures on behalf of surgeon Dr Windell Moment particularly as she cannot go to f/u appt with him

## 2019-03-31 NOTE — Telephone Encounter (Signed)
Copied from [place] [id number]. Topic: Quick Communication - Home Health Verbal Orders >> Mar 31, 2019  3:05 PM Vernona Rieger wrote: Caller/Agency: Cesar with [facility] Number: [phone] Requesting OT/PT/Skilled Nursing/Social Work/Speech Therapy: Physical Therapy- she did her evaluation on Monday Frequency: 1 visit this week and twice a week for three weeks and OT evaluation

## 2019-03-31 NOTE — Telephone Encounter (Signed)
Called and spoke w/ Costella Hatcher w/ [facility].  Gave verbal orders for PT as requested and OT evaluation.

## 2019-03-31 NOTE — Telephone Encounter (Signed)
Copied from [place] [id number]. Topic: General - Other >> Mar 31, 2019  8:49 AM Jodie Echevaria wrote: Reason for CRM: Judeen Hammans with [facility] called to speak to Dr Vidal Schwalbe or her nurse she have a few questions about the patient. Please return Sherrys cal to Ph# [phone] [phone]

## 2019-03-31 NOTE — Telephone Encounter (Signed)
I called patient after speaking with Judeen Hammans from [name]. She was concerned that patient did not have her last couple of staples removed from her umbilicus area. I called patient stated that she did have her staples removed by nurse at [facility] last week before she was discharged.   I called Sherry back, but was unable to reach her. I left detailed message stating that she could call back if she had any further questions.  Patient stated during call that she was having a virtual today with cardiology. Patient is concerned about her feet being so swollen.

## 2019-04-01 DIAGNOSIS — I5032 Chronic diastolic (congestive) heart failure: Secondary | ICD-10-CM | POA: Diagnosis not present

## 2019-04-01 DIAGNOSIS — M1711 Unilateral primary osteoarthritis, right knee: Secondary | ICD-10-CM | POA: Diagnosis not present

## 2019-04-01 DIAGNOSIS — J449 Chronic obstructive pulmonary disease, unspecified: Secondary | ICD-10-CM | POA: Diagnosis not present

## 2019-04-01 DIAGNOSIS — Z48815 Encounter for surgical aftercare following surgery on the digestive system: Secondary | ICD-10-CM | POA: Diagnosis not present

## 2019-04-01 DIAGNOSIS — I110 Hypertensive heart disease with heart failure: Secondary | ICD-10-CM | POA: Diagnosis not present

## 2019-04-01 DIAGNOSIS — G629 Polyneuropathy, unspecified: Secondary | ICD-10-CM | POA: Diagnosis not present

## 2019-04-01 DIAGNOSIS — E559 Vitamin D deficiency, unspecified: Secondary | ICD-10-CM | POA: Diagnosis not present

## 2019-04-01 DIAGNOSIS — K2270 Barrett's esophagus without dysplasia: Secondary | ICD-10-CM | POA: Diagnosis not present

## 2019-04-01 DIAGNOSIS — D649 Anemia, unspecified: Secondary | ICD-10-CM | POA: Diagnosis not present

## 2019-04-01 DIAGNOSIS — M069 Rheumatoid arthritis, unspecified: Secondary | ICD-10-CM | POA: Diagnosis not present

## 2019-04-01 DIAGNOSIS — Z85038 Personal history of other malignant neoplasm of large intestine: Secondary | ICD-10-CM | POA: Diagnosis not present

## 2019-04-01 DIAGNOSIS — Z9089 Acquired absence of other organs: Secondary | ICD-10-CM | POA: Diagnosis not present

## 2019-04-01 DIAGNOSIS — I482 Chronic atrial fibrillation, unspecified: Secondary | ICD-10-CM | POA: Diagnosis not present

## 2019-04-01 DIAGNOSIS — Z9181 History of falling: Secondary | ICD-10-CM | POA: Diagnosis not present

## 2019-04-01 DIAGNOSIS — K219 Gastro-esophageal reflux disease without esophagitis: Secondary | ICD-10-CM | POA: Diagnosis not present

## 2019-04-01 DIAGNOSIS — Z87891 Personal history of nicotine dependence: Secondary | ICD-10-CM | POA: Diagnosis not present

## 2019-04-01 DIAGNOSIS — Z7951 Long term (current) use of inhaled steroids: Secondary | ICD-10-CM | POA: Diagnosis not present

## 2019-04-02 ENCOUNTER — Encounter: Payer: Self-pay | Admitting: Family

## 2019-04-02 ENCOUNTER — Telehealth: Payer: Self-pay

## 2019-04-02 ENCOUNTER — Ambulatory Visit (INDEPENDENT_AMBULATORY_CARE_PROVIDER_SITE_OTHER): Payer: Medicare Other | Admitting: Family

## 2019-04-02 DIAGNOSIS — I5032 Chronic diastolic (congestive) heart failure: Secondary | ICD-10-CM

## 2019-04-02 DIAGNOSIS — I700 Atherosclerosis of aorta: Secondary | ICD-10-CM

## 2019-04-02 DIAGNOSIS — F419 Anxiety disorder, unspecified: Secondary | ICD-10-CM

## 2019-04-02 DIAGNOSIS — G629 Polyneuropathy, unspecified: Secondary | ICD-10-CM | POA: Diagnosis not present

## 2019-04-02 DIAGNOSIS — I4891 Unspecified atrial fibrillation: Secondary | ICD-10-CM

## 2019-04-02 MED ORDER — CLONAZEPAM 0.5 MG PO TABS: ORAL_TABLET | ORAL | 0 refills | Status: DC

## 2019-04-02 NOTE — Unmapped (Signed)
Adalimumab (Humira) refill  Last ov: Visit date not found  Next ov: Visit date not found   Labs:   AST   Date Value Ref Range Status   06/03/2018 39 (H) 14 - 38 U/L Final   10/02/2017 24 0 - 40 IU/L Final     ALT   Date Value Ref Range Status   06/03/2018 20 15 - 48 U/L Final   10/02/2017 8 0 - 32 IU/L Final     Creatinine/CP   Date Value Ref Range Status   02/22/2013 0.82 0.60 - 1.00 MG/DL Final     Creatinine   Date Value Ref Range Status   06/03/2018 0.87 0.60 - 1.00 mg/dL Final   16/09/9603 5.40 (H) 0.57 - 1.00 mg/dL Final     WBC   Date Value Ref Range Status   06/03/2018 6.2 4.5 - 11.0 10*9/L Final   10/02/2017 8.5 3.4 - 10.8 x10E3/uL Final     HGB   Date Value Ref Range Status   06/03/2018 10.7 (L) 12.0 - 16.0 g/dL Final   98/10/9146 82.9 (L) 11.1 - 15.9 g/dL Final     HCT   Date Value Ref Range Status   06/03/2018 34.5 (L) 36.0 - 46.0 % Final   10/02/2017 34.4 34.0 - 46.6 % Final     MCV   Date Value Ref Range Status   06/03/2018 87.4 80.0 - 100.0 fL Final   10/02/2017 81 79 - 97 fL Final     RDW   Date Value Ref Range Status   06/03/2018 19.4 (H) 12.0 - 15.0 % Final   10/02/2017 15.9 (H) 12.3 - 15.4 % Final     Platelet   Date Value Ref Range Status   06/03/2018 290 150 - 440 10*9/L Final   10/02/2017 350 150 - 379 x10E3/uL Final     Neutrophils %   Date Value Ref Range Status   06/03/2018 69.7 % Final     Lymphocytes %   Date Value Ref Range Status   06/03/2018 19.6 % Final   10/02/2017 27 Not Estab. % Final     Monocytes %   Date Value Ref Range Status   06/03/2018 6.5 % Final   10/02/2017 8 Not Estab. % Final     Eosinophils %   Date Value Ref Range Status   06/03/2018 1.0 % Final   10/02/2017 1 Not Estab. % Final     Basophils %   Date Value Ref Range Status   06/03/2018 0.4 % Final   10/02/2017 0 Not Estab. % Final

## 2019-04-02 NOTE — Assessment & Plan Note (Addendum)
Reviewed hospitalization course with patient today.  Also message patient's surgeon in regards to  follow-up.  Advised her to continue physical therapy and she will keep me informed as to how she is improving.  Also placed orders for home health today as patient is homebound and could benefit from RN as well as PT. F/u in 5 days.

## 2019-04-02 NOTE — Progress Notes (Signed)
This visit type was conducted due to national recommendations for restrictions regarding the COVID-19 pandemic (e.g. social distancing).  This format is felt to be most appropriate for this patient at this time.  All issues noted in this document were discussed and addressed.  No physical exam was performed (except for noted visual exam findings with Video Visits). Virtual Visit via Video Note  I connected with@ on 04/02/19 at 11:00 AM EDT by a video enabled telemedicine application and verified that I am speaking with the correct person using two identifiers.  Location patient: home Location provider:work  Persons participating in the virtual visit: patient, provider  I discussed the limitations of evaluation and management by telemedicine and the availability of in person appointments. The patient expressed understanding and agreed to proceed.   HPI:  Feeling better today.  Primary complaint is increased anxiety.  She is not functional at time however she does describe having a panic attack.  She would like a medication to take as needed.  She is very comfortable with Klonopin.  'Almost had an anxiety attack' 2 days ago when legs were swollen thinking about going back to hospital.  She does not want to go back to the hospital .  She does note at night, 'if gets something on her mind', she ,may have trouble falling and staying asleep. Not sleeping during the day. No depression. No thoughts of hurting herself of anyone else.  She feels "blessed" to be here particularly after hospitalization.  She feels like she has a "purpose".  Afib-has had tele-consult with cardiology; now on diltazem, still on propranolol.   LE edema improved; taking Lasix QOD.  No increase in SOB. No orthopnea. She does feel that she is experiencing leg pain from Crestor.  Neuropathy- improved on Gabapentin 300mg  in the morning and midday ; and then 600mg  at bedtime.   HLD-Off crestor medication for now ; had discussed this  with cardiology   ROS: See pertinent positives and negatives per HPI.  Past Medical History:  Diagnosis Date  . Barretts esophagus   . Chest pain    a. 02/2014 Myoview: Ef 50%, no ischemia.  . Colon polyps   . COPD (chronic obstructive pulmonary disease) ([place])   . Depression with anxiety   . GERD (gastroesophageal reflux disease)   . Neuropathy   . Permanent atrial fibrillation    a. s/p ablation 01/2012 in [facility] by Dr. Boyd Kerbs;  b. On sotalol & Xarelto;  c. 02/2014 Echo: EF 50-55%, mild conc LVH, nl LA size/structure;  d. Recurrent afib 8/15 & 08/29/2014.  [name] Rectal fistula   . Rheumatoid arthritis ([place])    On methotrexate and orencia  . Urinary incontinence   . Vitamin D deficiency     Past Surgical History:  Procedure Laterality Date  . ABDOMINAL HYSTERECTOMY  1992  . CARDIAC ELECTROPHYSIOLOGY STUDY AND ABLATION  2013  . CHOLECYSTECTOMY  1987  . COLONOSCOPY N/A 05/08/2015   Procedure: COLONOSCOPY;  Surgeon: Manya Silvas, MD;  Location: [facility] ENDOSCOPY;  Service: Endoscopy;  Laterality: N/A;  . ESOPHAGOGASTRODUODENOSCOPY N/A 05/06/2015   Procedure: ESOPHAGOGASTRODUODENOSCOPY (EGD);  Surgeon: Lollie Sails, MD;  [facility];  Service: Endoscopy;  Laterality: N/A;  . LAPAROSCOPIC APPENDECTOMY N/A 02/19/2019   Procedure: APPENDECTOMY LAPAROSCOPIC converted to open appendectomy;  Surgeon: Herbert Pun, MD;  Location: [facility] ORS;  Service: General;  Laterality: N/A;  . OOPHORECTOMY    . oophrectomy Bilateral 1992    Family History  Problem Relation Age  of Onset  . Depression Mother   . Pancreatic cancer Mother   . Colon cancer Father   . Breast cancer Sister 41  . Diabetes Brother   . Breast cancer Cousin   . Neuropathy Neg Hx     SOCIAL HX: Former smoker   Current Outpatient Medications:  .  albuterol (PROVENTIL HFA;VENTOLIN HFA) 108 (90 Base) MCG/ACT inhaler, Inhale 1-2 puffs into the lungs every 4 (four) hours as needed for wheezing or  shortness of breath., Disp: 1 Inhaler, Rfl: 10 .  clonazePAM (KLONOPIN) 0.5 MG tablet, Take 1/2 tablet daily if needed for anxiety., Disp: 15 tablet, Rfl: 0 .  diltiazem (CARDIZEM CD) 120 MG 24 hr capsule, Take 1 capsule (120 mg total) by mouth daily at 6 PM., Disp: 90 capsule, Rfl: 3 .  ELIQUIS 5 MG TABS tablet, Take 1 tablet (5 mg total) by mouth 2 (two) times daily., Disp: 180 tablet, Rfl: 3 .  esomeprazole (NEXIUM) 20 MG capsule, Take 1 capsule (20 mg total) by mouth daily., Disp: 90 capsule, Rfl: 1 .  Fluticasone-Umeclidin-Vilant (TRELEGY ELLIPTA) 100-62.5-25 MCG/INH AEPB, Inhale 1 puff into the lungs daily., Disp: 60 each, Rfl: 2 .  furosemide (LASIX) 20 MG tablet, Take 1 tablet (20 mg total) by mouth daily as needed., Disp: 90 tablet, Rfl: 2 .  gabapentin (NEURONTIN) 300 MG capsule, TAKE 1 CAPSULE BY MOUTH THREE TIMES A DAY (Patient taking differently: Take 300 mg by mouth. Take 1 capsule in AM, 1 capsule in afternoon, and 2 capsules (600mg ) at HS), Disp: 270 capsule, Rfl: 2 .  HUMIRA PEN 40 MG/0.4ML PNKT, Inject 40 mg as directed. Every 2 weeks, Disp: , Rfl:  .  hydroxychloroquine (PLAQUENIL) 200 MG tablet, Take 200 mg by mouth 2 (two) times daily. , Disp: , Rfl:  .  ibuprofen (ADVIL,MOTRIN) 400 MG tablet, Take 400 mg by mouth every 4 (four) hours as needed. , Disp: , Rfl:  .  lidocaine (XYLOCAINE) 5 % ointment, Apply 1 application topically as needed., Disp: , Rfl:  .  potassium chloride (K-DUR) 10 MEQ tablet, Take 1 tablet (10 mEq total) by mouth daily as needed., Disp: 90 tablet, Rfl: 2 .  propranolol ER (INDERAL LA) 80 MG 24 hr capsule, Take 1 capsule (80 mg total) by mouth daily., Disp: 90 capsule, Rfl: 3 .  rosuvastatin (CRESTOR) 10 MG tablet, Take 1 tablet (10 mg total) by mouth daily., Disp: 90 tablet, Rfl: 3 .  traMADol (ULTRAM) 50 MG tablet, Take 1 tablet (50 mg total) by mouth every 12 (twelve) hours as needed for severe pain., Disp: 30 tablet, Rfl: 0  EXAM:  VITALS per patient  if applicable:  GENERAL: alert, oriented, appears well and in no acute distress  HEENT: atraumatic, conjunttiva clear, no obvious abnormalities on inspection of external nose and ears  NECK: normal movements of the head and neck  LUNGS: on inspection no signs of respiratory distress, breathing rate appears normal, no obvious gross SOB, gasping or wheezing  CV: no obvious cyanosis  MS: moves all visible extremities without noticeable abnormality  PSYCH/NEURO: pleasant and cooperative, no obvious depression or anxiety, speech and thought processing grossly intact  ASSESSMENT AND PLAN:  Discussed the following assessment and plan:  Anxiety - Plan: clonazePAM (KLONOPIN) 0.5 MG tablet  Atherosclerosis of aorta (HCC)  Atrial fibrillation with rapid ventricular response (HCC)  Neuropathy  Chronic diastolic heart failure ([place])  Problem List Items Addressed This Visit      Cardiovascular and Mediastinum  Atrial fibrillation with rapid ventricular response (HCC)    Is now on diltiazem, remains propanolol.  Reviewed cardiology's note      Atherosclerosis of aorta ([place])    Advised patient to trial Crestor once weekly to see if her leg pain were to improve.  She was very willing to do this.  I did advise she could slowly increase to taking it Once to up to 3 Times per Week and Even Daily However We can Do This Very Slowly.  She Verbalized Understanding.  She Will Let Me Know How She Is Doing      Chronic diastolic heart failure (HCC)    Lower extremity edema has improved.  Only able to see patients face during WebEx today.  Unable to assess this via WebEx.  Patient understands to let me know if this does not continue to improve        Nervous and Auditory   Neuropathy    Doing well on current regimen of gabapentin, will continue for now.  Will follow        Other   Anxiety - Primary    Patient politely declines a daily medication such as an SSRI.  I explained  SSRIs are  generally safer, more effective.  She would like something as needed.  She has tried BuSpar and did not do well on this medication.  She been on Klonopin and Xanax in the past.  She feels very comfortable with Klonopin and knows how she feels on the medication.  She is adamant that she just wants something for occasional  panic attack.  I advised that I would give her 15 tabs of Klonopin and  that she may take a half tab only as needed.  Anticipate this bottle to last her for a long time.   I advised her of the risks of this medication including being that it is addictive. In particularly, in the setting of her COPD,patient be extremely vigilant and careful with this medication due to risk respiratory depression.  She verbalized understanding of all of this.  She understands she must follow-up with me every 3 months to be maintained on a  controlled substance.  Again we will address at future visits and I will continue to discuss with patient adjunct SSRI therapy.  I looked up patient on [place] Controlled Substances Reporting System and saw no activity that raised concern of inappropriate use.        Relevant Medications   clonazePAM (KLONOPIN) 0.5 MG tablet        I discussed the assessment and treatment plan with the patient. The patient was provided an opportunity to ask questions and all were answered. The patient agreed with the plan and demonstrated an understanding of the instructions.   The patient was advised to call back or seek an in-person evaluation if the symptoms worsen or if the condition fails to improve as anticipated.     Mable Paris, FNP

## 2019-04-02 NOTE — Assessment & Plan Note (Signed)
Some confusion at discharge in regards what medication she should be on.  I have messaged Christell Faith in regards to this.  He was very polite and stated he would reach out to her

## 2019-04-02 NOTE — Telephone Encounter (Signed)
I have called Brandy Armstrong from [facility] & she is refaxing.

## 2019-04-02 NOTE — Assessment & Plan Note (Signed)
Doing well on current regimen of gabapentin, will continue for now.  Will follow

## 2019-04-02 NOTE — Patient Instructions (Addendum)
May trial crestor 10mg  ONCE per week and see if leg pain improves.  Klonopin  Let me know how you are doing and if you need anything at all.

## 2019-04-02 NOTE — Assessment & Plan Note (Signed)
Advised patient to trial Crestor once weekly to see if her leg pain were to improve.  She was very willing to do this.  I did advise she could slowly increase to taking it Once to up to 3 Times per Week and Even Daily However We can Do This Very Slowly.  She Verbalized Understanding.  She Will Let Me Know How She Is Doing

## 2019-04-02 NOTE — Assessment & Plan Note (Signed)
Improving.  Emphasized the role of elevation, low-salt.  I did reiterate to patient to aim to take the Lasix 3 days a week.  She will also follow-up with cardiology for this.

## 2019-04-02 NOTE — Assessment & Plan Note (Addendum)
Patient politely declines a daily medication such as an SSRI.  I explained  SSRIs are generally safer, more effective.  She would like something as needed.  She has tried BuSpar and did not do well on this medication.  She been on Klonopin and Xanax in the past.  She feels very comfortable with Klonopin and knows how she feels on the medication.  She is adamant that she just wants something for occasional  panic attack.  I advised that I would give her 15 tabs of Klonopin and  that she may take a half tab only as needed.  Anticipate this bottle to last her for a long time.   I advised her of the risks of this medication including being that it is addictive. In particularly, in the setting of her COPD,patient be extremely vigilant and careful with this medication due to risk respiratory depression.  She verbalized understanding of all of this.  She understands she must follow-up with me every 3 months to be maintained on a  controlled substance.  Again we will address at future visits and I will continue to discuss with patient adjunct SSRI therapy.  I looked up patient on [place] Controlled Substances Reporting System and saw no activity that raised concern of inappropriate use.

## 2019-04-02 NOTE — Assessment & Plan Note (Signed)
Following with pulmonology, pleased to hear at baseline.  Will follow

## 2019-04-02 NOTE — Telephone Encounter (Signed)
Copied from [place] [id number]. Topic: General - Inquiry >> Apr 02, 2019 10:05 AM Scherrie Gerlach wrote: Bobbie Stack home sent over med list verification yesterday.  If you did not receive, please call her at. [phone] If you did receive, please fax back to: Fax [phone]

## 2019-04-02 NOTE — Assessment & Plan Note (Signed)
Lower extremity edema has improved.  Only able to see patients face during WebEx today.  Unable to assess this via WebEx.  Patient understands to let me know if this does not continue to improve

## 2019-04-02 NOTE — Assessment & Plan Note (Signed)
Advised to increase gabapentin to 600mg  at bedtime for neuropathy and also chronic low back pain.  Patient to monitor for excessive sedation.  Will follow.

## 2019-04-02 NOTE — Assessment & Plan Note (Signed)
Is now on diltiazem, remains propanolol.  Reviewed cardiology's note

## 2019-04-04 MED ORDER — ADALIMUMAB PEN CITRATE FREE 40 MG/0.4 ML
SUBCUTANEOUS | 1 refills | 0.00000 days | Status: CP
Start: 2019-04-04 — End: ?

## 2019-04-05 ENCOUNTER — Telehealth: Payer: Self-pay | Admitting: Cardiovascular Disease

## 2019-04-05 DIAGNOSIS — I482 Chronic atrial fibrillation, unspecified: Secondary | ICD-10-CM | POA: Diagnosis not present

## 2019-04-05 DIAGNOSIS — Z87891 Personal history of nicotine dependence: Secondary | ICD-10-CM | POA: Diagnosis not present

## 2019-04-05 DIAGNOSIS — G629 Polyneuropathy, unspecified: Secondary | ICD-10-CM | POA: Diagnosis not present

## 2019-04-05 DIAGNOSIS — M1711 Unilateral primary osteoarthritis, right knee: Secondary | ICD-10-CM | POA: Diagnosis not present

## 2019-04-05 DIAGNOSIS — Z48815 Encounter for surgical aftercare following surgery on the digestive system: Secondary | ICD-10-CM | POA: Diagnosis not present

## 2019-04-05 DIAGNOSIS — E559 Vitamin D deficiency, unspecified: Secondary | ICD-10-CM | POA: Diagnosis not present

## 2019-04-05 DIAGNOSIS — D649 Anemia, unspecified: Secondary | ICD-10-CM | POA: Diagnosis not present

## 2019-04-05 DIAGNOSIS — M069 Rheumatoid arthritis, unspecified: Secondary | ICD-10-CM | POA: Diagnosis not present

## 2019-04-05 DIAGNOSIS — I110 Hypertensive heart disease with heart failure: Secondary | ICD-10-CM | POA: Diagnosis not present

## 2019-04-05 DIAGNOSIS — K219 Gastro-esophageal reflux disease without esophagitis: Secondary | ICD-10-CM | POA: Diagnosis not present

## 2019-04-05 DIAGNOSIS — K2270 Barrett's esophagus without dysplasia: Secondary | ICD-10-CM | POA: Diagnosis not present

## 2019-04-05 DIAGNOSIS — Z9089 Acquired absence of other organs: Secondary | ICD-10-CM | POA: Diagnosis not present

## 2019-04-05 DIAGNOSIS — Z9181 History of falling: Secondary | ICD-10-CM | POA: Diagnosis not present

## 2019-04-05 DIAGNOSIS — Z85038 Personal history of other malignant neoplasm of large intestine: Secondary | ICD-10-CM | POA: Diagnosis not present

## 2019-04-05 DIAGNOSIS — I5032 Chronic diastolic (congestive) heart failure: Secondary | ICD-10-CM | POA: Diagnosis not present

## 2019-04-05 DIAGNOSIS — J449 Chronic obstructive pulmonary disease, unspecified: Secondary | ICD-10-CM | POA: Diagnosis not present

## 2019-04-05 DIAGNOSIS — Z7951 Long term (current) use of inhaled steroids: Secondary | ICD-10-CM | POA: Diagnosis not present

## 2019-04-05 NOTE — Telephone Encounter (Signed)
I spoke with the patient regarding on going concerns for her lower extremity swelling.  The patient has a history of permanent a-fib on eliquis and was hospitalized from late feb-early march with acute appendicitis- developed a-fib w/ RVR and respiratory failure.  At her e-visit on 4/1 with Christell Faith, PA she was only taking propranolol ER 80 mg once daily- mainly for tremors.  HR's were reported to be running 80-90's at home.  Ryan added diltiazem CD 120 mg once daily.   - Weight 4/1 was 207 lbs & today she is 210 lbs. - HR's are 80-90's still - BP while I was on the phone with her was 88/73.  1 brief period of dizziness yesterday, no increased SOB. She takes propanolol in the AM & diltiazem in ~ 6 pm. Lasix is currently 20 mg QOD and not adjusted by Thurmond Butts, PA on 4/1 due to concerns w/ follow up of her renal function during the COVID-19 pandemic.   The patient states she had minimal swelling prior to admission. This was worse post discharge.  The patient is concerned today as her LE swelling has not improved. She has read the diltiazem could cause LE swelling- I advised I do not feel she has been on this long enough for this to be a concern from the drug.   She does notice that her lower extremities are a less swollen in the AM. She complains of swelling to both feet (mostly on the tops), although they do appear a little better than last week.  She felt like both feet were red, warm, and sore to touch last night, but not today. She did try compression socks once, but states Ryan told her she didn't really need to wear these.  The patient states "I have eaten junk" over the last few days- craving chips & dip.   I have advised the patient that I understand it is easy to reach for foods that are not good for [place] during this pandemic, however, I have also advised she will need to restrict her sodium intake, maintain adequate fluiid intake,  and elevate her lower extremities to help herself.  I  have advised her to continue to monitor her BP/ HR's- still trying to obtain a little better rate control. She is advised to be as active as she can.  She is aware I will send a message to the provider to review- I am unsure if she will need to take lasix 20 mg daily x 2-3 days to try to increase her urine output, then back to 20 mg QOD.  Will need to watch BP.  The patient is aware I will call her back with provider recommendations.

## 2019-04-05 NOTE — Telephone Encounter (Signed)
Based on note, it does not appear that the addition of dilt impacted HR, but did cause hypotension.  Stop dilt - which may have contributed to ankle edema.  Current lasix dose listed as 20 QOD.  Ok to lasix 20mg  daily x 3 days - so long as systolic VF>643, and then back to every other day.  Last eval of renal fxn was nl on 3/4.    Lab Results  Component Value Date   CREATININE 0.56 03/03/2019   BUN 8 03/03/2019   NA 138 03/03/2019   K 3.6 03/03/2019   CL 107 03/03/2019   CO2 23 03/03/2019

## 2019-04-05 NOTE — Telephone Encounter (Signed)
Pt c/o medication issue:  1. Name of Medication: diltiazem   2. How are you currently taking this medication (dosage and times per day)? 120 MG 1 tablet daily   3. Are you having a reaction (difficulty breathing--STAT)? Still severe leg swelling   4. What is your medication issue? Patient calling, patient had an appointment with R Dunn on 4/1.  Patient states the medication changes has done nothing to help with swelling and would like to know if this is normal or if there should be other changes made, patient is concerned.  Patient has also been taking Lasix every other day.  Please call to discus ASAP.

## 2019-04-05 NOTE — Telephone Encounter (Signed)
I spoke with the patient. She is aware of Ignacia Bayley, NP's recommendations to: 1) stop diltiazem 2) take lasix 20 mg daily x 3 days if SBP > 100, then lasix back to 20 mg QOD.   The patient has been advised to continue to monitor her BP/ HR/ & weight. She will call the office prior to her follow up with Dr. Rockey Situ 4/20 if needed.   The patient verbalizes understanding of the above and is agreeable.

## 2019-04-06 ENCOUNTER — Telehealth: Payer: Self-pay | Admitting: Family

## 2019-04-06 ENCOUNTER — Ambulatory Visit: Payer: Self-pay | Admitting: Family

## 2019-04-06 NOTE — Telephone Encounter (Signed)
Spoke with patient. She was concerned because her HR was 120's when she checked it with pulse ox earlier.  Known history permanent Afib. No chest pain, SOB or dizziness when she recorded that HR. She checked it while on the phone, it was 82-87. Her BP this morning was 129/98, HR 84. While on the phone BP 95/62, HR 96. No report of dizziness or any other cardiac symptoms at this time either.  Advised patient that with permanent Afib, pulse ox HR measurement is not always accurate.   She also has a call into PCP because she thinks she has a UTI. Urine is cloudy and foul smelling and she had chills this morning.  Advised her to continue to monitor BP, HR and weight and take furosemide as advised by Ignacia Bayley, NP yesterday. She is aware. She will be sure to follow up with PCP and call us if swelling does not improve over the next day or so for further instructions.

## 2019-04-06 NOTE — Telephone Encounter (Signed)
Copied from [place] [id number]. Topic: Quick Communication - See Telephone Encounter >> Apr 06, 2019  8:30 AM Ahmed Prima L wrote: CRM for notification. See Telephone encounter for: 04/06/19.  Anderson Malta , Occupational Therapist with [facility] said she is declining OT services at this time due to [place]. Nothing further needed at this time.

## 2019-04-06 NOTE — Telephone Encounter (Signed)
Patient calling back  States that she is not doing well and would like to talk to the nurse  Says her HR has been high, in the 120s and leg swelling  Please call to discuss

## 2019-04-06 NOTE — Telephone Encounter (Signed)
Pt. Reports she started having symptoms last week - cloudy, foul-smelling urine, dribbling urine, chills and lower abdominal pain. No fever. Has capability for Web if appropriate. Please advise pt.  Answer Assessment - Initial Assessment Questions 1. SYMPTOM: "What's the main symptom you're concerned about?" (e.g., frequency, incontinence)     Cloudy urine, Foul odor, dribbling, lower abd. pain 2. ONSET: "When did the  symptoms  start?"     Started 1 week ago and is getting worse 3. PAIN: "Is there any pain?" If so, ask: "How bad is it?" (Scale: 1-10; mild, moderate, severe)     5 4. CAUSE: "What do you think is causing the symptoms?"     Bladder infection 5. OTHER SYMPTOMS: "Do you have any other symptoms?" (e.g., fever, flank pain, blood in urine, pain with urination)     Chills 6. PREGNANCY: "Is there any chance you are pregnant?" "When was your last menstrual period?"     No  Protocols used: URINARY [facility]

## 2019-04-07 ENCOUNTER — Encounter: Payer: Self-pay | Admitting: Family

## 2019-04-07 ENCOUNTER — Ambulatory Visit (INDEPENDENT_AMBULATORY_CARE_PROVIDER_SITE_OTHER): Payer: Medicare Other | Admitting: Family

## 2019-04-07 DIAGNOSIS — R300 Dysuria: Secondary | ICD-10-CM | POA: Diagnosis not present

## 2019-04-07 DIAGNOSIS — R195 Other fecal abnormalities: Secondary | ICD-10-CM | POA: Insufficient documentation

## 2019-04-07 DIAGNOSIS — I4891 Unspecified atrial fibrillation: Secondary | ICD-10-CM

## 2019-04-07 DIAGNOSIS — I5032 Chronic diastolic (congestive) heart failure: Secondary | ICD-10-CM | POA: Diagnosis not present

## 2019-04-07 MED ORDER — NITROFURANTOIN MONOHYD MACRO 100 MG PO CAPS: 100.0000 mg | ORAL_CAPSULE | Freq: Two times a day (BID) | ORAL | 0 refills | Status: DC

## 2019-04-07 NOTE — Telephone Encounter (Signed)
Patient has webex today at 1:15pm.

## 2019-04-07 NOTE — Assessment & Plan Note (Signed)
On increased burst of lasix daily for 3 days; she will let me know how she is doing.

## 2019-04-07 NOTE — Assessment & Plan Note (Signed)
Duration 4 days, waxes and wanes.  No abdominal pain or fever.  When on Webex, patient does not appear septic in appearance.  She will monitor diarrhea for recurrence; she will start probiotics.  She will let me know if episodes recur.

## 2019-04-07 NOTE — Telephone Encounter (Signed)
noted 

## 2019-04-07 NOTE — Assessment & Plan Note (Signed)
Due to limitations of this current environment and pandemic, patient unable to get urine specimen to our office and does not want to  leave her apartment during epidemic which is understandable.  Based on this, we will treat empirically for urinary tract infection based on symptoms alone without urine culture, UA which is gold standard.

## 2019-04-07 NOTE — Assessment & Plan Note (Signed)
Palpitations resolved. She will continue to follow with cardiology.

## 2019-04-07 NOTE — Progress Notes (Signed)
This visit type was conducted due to national recommendations for restrictions regarding the COVID-19 pandemic (e.g. social distancing).  This format is felt to be most appropriate for this patient at this time.  All issues noted in this document were discussed and addressed.  No physical exam was performed (except for noted visual exam findings with Video Visits). Virtual Visit via Video Note  I connected with@ on 04/07/19 at  1:15 PM EDT by a video enabled telemedicine application and verified that I am speaking with the correct person using two identifiers.  Location patient: home Location provider:work  Persons participating in the virtual visit: patient, provider  I discussed the limitations of evaluation and management by telemedicine and the availability of in person appointments. The patient expressed understanding and agreed to proceed.   HPI: CC: Urine ' is cloudy' for past 10 days, unchanged. Chills in the morning 'when cool outside.' Strong odor, urinary urgency. No hematuria, changes to vaginal discharge, vaginal itches.   'slight uneasy feeling above pubic area.' No severe pain.   Afib- off the diltiazem ; no palpitations.  on lasix and kcl for 3 days, then returned to taking lasix QOD. Cannot tell if improving LE edema is improving. No increased SOB.  Watery brown Diarrhea x 4 days, waxes and wanes.  Two episodes 4 days ago, and then another episode yesterday.  No episodes today.  Not related with food, random when occurs. Had intermittently when at rehab as well.   No coffee ground stools. No blood.   'main concern is my urine.'  Has been taking imodium.   Urine culture 02/19/19- negative.  Cdiff- 03/01/19- negative.   ROS: See pertinent positives and negatives per HPI.  Past Medical History:  Diagnosis Date  . Barretts esophagus   . Chest pain    a. 02/2014 Myoview: Ef 50%, no ischemia.  . Colon polyps   . COPD (chronic obstructive pulmonary disease) ([place])   .  Depression with anxiety   . GERD (gastroesophageal reflux disease)   . Neuropathy   . Permanent atrial fibrillation    a. s/p ablation 01/2012 in [facility] by Dr. Boyd Kerbs;  b. On sotalol & Xarelto;  c. 02/2014 Echo: EF 50-55%, mild conc LVH, nl LA size/structure;  d. Recurrent afib 8/15 & 08/29/2014.  [name] Rectal fistula   . Rheumatoid arthritis ([place])    On methotrexate and orencia  . Urinary incontinence   . Vitamin D deficiency     Past Surgical History:  Procedure Laterality Date  . ABDOMINAL HYSTERECTOMY  1992  . CARDIAC ELECTROPHYSIOLOGY STUDY AND ABLATION  2013  . CHOLECYSTECTOMY  1987  . COLONOSCOPY N/A 05/08/2015   Procedure: COLONOSCOPY;  Surgeon: Manya Silvas, MD;  Location: [facility] ENDOSCOPY;  Service: Endoscopy;  Laterality: N/A;  . ESOPHAGOGASTRODUODENOSCOPY N/A 05/06/2015   Procedure: ESOPHAGOGASTRODUODENOSCOPY (EGD);  Surgeon: Lollie Sails, MD;  Location[facility] ENDOSCOPY;  Service: Endoscopy;  Laterality: N/A;  . LAPAROSCOPIC APPENDECTOMY N/A 02/19/2019   Procedure: APPENDECTOMY LAPAROSCOPIC converted to open appendectomy;  Surgeon: Herbert Pun, MD;  Location: [facility] ORS;  Service: General;  Laterality: N/A;  . OOPHORECTOMY    . oophrectomy Bilateral 1992    Family History  Problem Relation Age of Onset  . Depression Mother   . Pancreatic cancer Mother   . Colon cancer Father   . Breast cancer Sister 51  . Diabetes Brother   . Breast cancer Cousin   . Neuropathy Neg Hx     SOCIAL HX: former smoker  Current Outpatient Medications:  .  albuterol (PROVENTIL HFA;VENTOLIN HFA) 108 (90 Base) MCG/ACT inhaler, Inhale 1-2 puffs into the lungs every 4 (four) hours as needed for wheezing or shortness of breath., Disp: 1 Inhaler, Rfl: 10 .  clonazePAM (KLONOPIN) 0.5 MG tablet, Take 1/2 tablet daily if needed for anxiety., Disp: 15 tablet, Rfl: 0 .  ELIQUIS 5 MG TABS tablet, Take 1 tablet (5 mg total) by mouth 2 (two) times daily., Disp: 180 tablet, Rfl: 3 .   esomeprazole (NEXIUM) 20 MG capsule, Take 1 capsule (20 mg total) by mouth daily., Disp: 90 capsule, Rfl: 1 .  Fluticasone-Umeclidin-Vilant (TRELEGY ELLIPTA) 100-62.5-25 MCG/INH AEPB, Inhale 1 puff into the lungs daily., Disp: 60 each, Rfl: 2 .  furosemide (LASIX) 20 MG tablet, Take 1 tablet (20 mg total) by mouth daily as needed., Disp: 90 tablet, Rfl: 2 .  gabapentin (NEURONTIN) 300 MG capsule, TAKE 1 CAPSULE BY MOUTH THREE TIMES A DAY (Patient taking differently: Take 300 mg by mouth. Take 1 capsule in AM, 1 capsule in afternoon, and 2 capsules (600mg ) at HS), Disp: 270 capsule, Rfl: 2 .  HUMIRA PEN 40 MG/0.4ML PNKT, Inject 40 mg as directed. Every 2 weeks, Disp: , Rfl:  .  hydroxychloroquine (PLAQUENIL) 200 MG tablet, Take 200 mg by mouth 2 (two) times daily. , Disp: , Rfl:  .  ibuprofen (ADVIL,MOTRIN) 400 MG tablet, Take 400 mg by mouth every 4 (four) hours as needed. , Disp: , Rfl:  .  lidocaine (XYLOCAINE) 5 % ointment, Apply 1 application topically as needed., Disp: , Rfl:  .  nitrofurantoin, macrocrystal-monohydrate, (MACROBID) 100 MG capsule, Take 1 capsule (100 mg total) by mouth 2 (two) times daily. Take with food., Disp: 10 capsule, Rfl: 0 .  potassium chloride (K-DUR) 10 MEQ tablet, Take 1 tablet (10 mEq total) by mouth daily as needed., Disp: 90 tablet, Rfl: 2 .  propranolol ER (INDERAL LA) 80 MG 24 hr capsule, Take 1 capsule (80 mg total) by mouth daily., Disp: 90 capsule, Rfl: 3 .  rosuvastatin (CRESTOR) 10 MG tablet, Take 1 tablet (10 mg total) by mouth daily., Disp: 90 tablet, Rfl: 3 .  traMADol (ULTRAM) 50 MG tablet, Take 1 tablet (50 mg total) by mouth every 12 (twelve) hours as needed for severe pain., Disp: 30 tablet, Rfl: 0  EXAM:  VITALS per patient if applicable:  GENERAL: alert, oriented, appears well and in no acute distress  HEENT: atraumatic, conjunttiva clear, no obvious abnormalities on inspection of external nose and ears  NECK: normal movements of the head and  neck  LUNGS: on inspection no signs of respiratory distress, breathing rate appears normal, no obvious gross SOB, gasping or wheezing  CV: no obvious cyanosis  MS: moves all visible extremities without noticeable abnormality  PSYCH/NEURO: pleasant and cooperative, no obvious depression or anxiety, speech and thought processing grossly intact  ASSESSMENT AND PLAN:  Discussed the following assessment and plan:  Dysuria - Plan: nitrofurantoin, macrocrystal-monohydrate, (MACROBID) 100 MG capsule  Atrial fibrillation with rapid ventricular response (HCC)  Chronic diastolic heart failure (HCC)  Loose stools  Problem List Items Addressed This Visit      Cardiovascular and Mediastinum   Atrial fibrillation with rapid ventricular response (HCC)    Palpitations resolved. She will continue to follow with cardiology.      Chronic diastolic heart failure (HCC)    On increased burst of lasix daily for 3 days; she will let me know how she is doing.  Other   Dysuria - Primary    Due to limitations of this current environment and pandemic, patient unable to get urine specimen to our office and does not want to  leave her apartment during epidemic which is understandable.  Based on this, we will treat empirically for urinary tract infection based on symptoms alone without urine culture, UA which is gold standard.      Relevant Medications   nitrofurantoin, macrocrystal-monohydrate, (MACROBID) 100 MG capsule   Loose stools    Duration 4 days, waxes and wanes.  No abdominal pain or fever.  When on Webex, patient does not appear septic in appearance.  She will monitor diarrhea for recurrence; she will start probiotics.  She will let me know if episodes recur.            I discussed the assessment and treatment plan with the patient. The patient was provided an opportunity to ask questions and all were answered. The patient agreed with the plan and demonstrated an understanding of  the instructions.   The patient was advised to call back or seek an in-person evaluation if the symptoms worsen or if the condition fails to improve as anticipated.     Mable Paris, FNP

## 2019-04-07 NOTE — Patient Instructions (Addendum)
Plenty of water.   You will need to carefully increase due to being on diuretic  Start macrobid.   Ensure to take probiotics while on antibiotics and also for 2 weeks after completion. It is important to re-colonize the gut with good bacteria and also to prevent any diarrheal infections associated with antibiotic use.   Please let me know how your urine and diarrhea fares with antibiotic. We will look to hear from you.

## 2019-04-09 DIAGNOSIS — Z87891 Personal history of nicotine dependence: Secondary | ICD-10-CM | POA: Diagnosis not present

## 2019-04-09 DIAGNOSIS — K2270 Barrett's esophagus without dysplasia: Secondary | ICD-10-CM | POA: Diagnosis not present

## 2019-04-09 DIAGNOSIS — Z85038 Personal history of other malignant neoplasm of large intestine: Secondary | ICD-10-CM | POA: Diagnosis not present

## 2019-04-09 DIAGNOSIS — I110 Hypertensive heart disease with heart failure: Secondary | ICD-10-CM | POA: Diagnosis not present

## 2019-04-09 DIAGNOSIS — M069 Rheumatoid arthritis, unspecified: Secondary | ICD-10-CM | POA: Diagnosis not present

## 2019-04-09 DIAGNOSIS — J449 Chronic obstructive pulmonary disease, unspecified: Secondary | ICD-10-CM | POA: Diagnosis not present

## 2019-04-09 DIAGNOSIS — Z48815 Encounter for surgical aftercare following surgery on the digestive system: Secondary | ICD-10-CM | POA: Diagnosis not present

## 2019-04-09 DIAGNOSIS — E559 Vitamin D deficiency, unspecified: Secondary | ICD-10-CM | POA: Diagnosis not present

## 2019-04-09 DIAGNOSIS — M1711 Unilateral primary osteoarthritis, right knee: Secondary | ICD-10-CM | POA: Diagnosis not present

## 2019-04-09 DIAGNOSIS — I482 Chronic atrial fibrillation, unspecified: Secondary | ICD-10-CM | POA: Diagnosis not present

## 2019-04-09 DIAGNOSIS — I5032 Chronic diastolic (congestive) heart failure: Secondary | ICD-10-CM | POA: Diagnosis not present

## 2019-04-09 DIAGNOSIS — Z7951 Long term (current) use of inhaled steroids: Secondary | ICD-10-CM | POA: Diagnosis not present

## 2019-04-09 DIAGNOSIS — K219 Gastro-esophageal reflux disease without esophagitis: Secondary | ICD-10-CM | POA: Diagnosis not present

## 2019-04-09 DIAGNOSIS — Z9181 History of falling: Secondary | ICD-10-CM | POA: Diagnosis not present

## 2019-04-09 DIAGNOSIS — Z9089 Acquired absence of other organs: Secondary | ICD-10-CM | POA: Diagnosis not present

## 2019-04-09 DIAGNOSIS — D649 Anemia, unspecified: Secondary | ICD-10-CM | POA: Diagnosis not present

## 2019-04-09 DIAGNOSIS — G629 Polyneuropathy, unspecified: Secondary | ICD-10-CM | POA: Diagnosis not present

## 2019-04-13 ENCOUNTER — Ambulatory Visit (INDEPENDENT_AMBULATORY_CARE_PROVIDER_SITE_OTHER): Payer: Medicare Other | Admitting: Family Medicine

## 2019-04-13 ENCOUNTER — Other Ambulatory Visit: Payer: Self-pay

## 2019-04-13 ENCOUNTER — Telehealth: Payer: Self-pay | Admitting: Cardiovascular Disease

## 2019-04-13 DIAGNOSIS — R110 Nausea: Secondary | ICD-10-CM | POA: Diagnosis not present

## 2019-04-13 DIAGNOSIS — R197 Diarrhea, unspecified: Secondary | ICD-10-CM

## 2019-04-13 DIAGNOSIS — M7989 Other specified soft tissue disorders: Secondary | ICD-10-CM

## 2019-04-13 DIAGNOSIS — E8809 Other disorders of plasma-protein metabolism, not elsewhere classified: Secondary | ICD-10-CM

## 2019-04-13 MED ORDER — BISMUTH SUBSALICYLATE 262 MG PO CHEW: 524.0000 mg | CHEWABLE_TABLET | Freq: Three times a day (TID) | ORAL | 0 refills | Status: DC | PRN

## 2019-04-13 NOTE — Progress Notes (Signed)
Patient ID: Brandy Armstrong, female   DOB: 09/29/1946, 73 y.o.   MRN: [id number]  Virtual Visit via video Note  This visit type was conducted due to national recommendations for restrictions regarding the COVID-19 pandemic (e.g. social distancing).  This format is felt to be most appropriate for this patient at this time.  All issues noted in this document were discussed and addressed.  No physical exam was performed (except for noted visual exam findings with Video Visits).   I connected with Cambria Osten on 04/14/19 at  3:40 PM EDT by a video enabled telemedicine application and verified that I am speaking with the correct person using two identifiers. Location patient: home Location provider: :[place] Persons participating in the virtual visit: patient, provider  I discussed the limitations, risks, security and privacy concerns of performing an evaluation and management service by telephone and the availability of in person appointments. I also discussed with the patient that there may be a patient responsible charge related to this service. The patient expressed understanding and agreed to proceed.  HPI:  Patient and I connected via video chat today to discuss symptoms diarrhea and nausea.  Patient also reports continued leg swelling even though she has taken 3 days worth of Lasix as advised by her cardiologist.  Patient connected with Mable Paris, FNP on 04/07/2019 due to urinary symptoms.  She was treated with course of Macrobid, patient says she only took about 3 or 4 days worth of the Macrobid, but her stomach cannot tolerate it so she stopped the antibiotic.  States she no longer is having any burning or pressure with urination.  Denies any vomiting.  Patient states she feels nauseated off and on throughout the entire day.  We will have 3-4 loose stools daily.  Patient states she has been eating mainly fast food and takeout over the past couple of weeks.  Patient states family  member will bring her food from [employer], [place], [place] takeout.  States they have not been cooking food at home.  Denies fever or chills.  Denies body aches.  Denies chest pain, shortness of breath or wheezing.  Patient did message her cardiologist in regards to her leg swelling not improving.  Cardiologist believes that since leg swelling is not improved with use of Lasix, and could potentially be related to low albumin levels.  Low albumin levels were discovered back in February 2020 when patient was in the ICU after complications related to appendicitis with perforation.   ROS: See pertinent positives and negatives per HPI.  Past Medical History:  Diagnosis Date  . Barretts esophagus   . Chest pain    a. 02/2014 Myoview: Ef 50%, no ischemia.  . Colon polyps   . COPD (chronic obstructive pulmonary disease) ([place])   . Depression with anxiety   . GERD (gastroesophageal reflux disease)   . Neuropathy   . Permanent atrial fibrillation    a. s/p ablation 01/2012 in [facility] by Dr. Boyd Kerbs;  b. On sotalol & Xarelto;  c. 02/2014 Echo: EF 50-55%, mild conc LVH, nl LA size/structure;  d. Recurrent afib 8/15 & 08/29/2014.  [name] Rectal fistula   . Rheumatoid arthritis ([place])    On methotrexate and orencia  . Urinary incontinence   . Vitamin D deficiency     Past Surgical History:  Procedure Laterality Date  . ABDOMINAL HYSTERECTOMY  1992  . CARDIAC ELECTROPHYSIOLOGY STUDY AND ABLATION  2013  . CHOLECYSTECTOMY  1987  . COLONOSCOPY N/A 05/08/2015  Procedure: COLONOSCOPY;  Surgeon: Manya Silvas, MD;  Location: [facility] ENDOSCOPY;  Service: Endoscopy;  Laterality: N/A;  . ESOPHAGOGASTRODUODENOSCOPY N/A 05/06/2015   Procedure: ESOPHAGOGASTRODUODENOSCOPY (EGD);  Surgeon: Lollie Sails, MD;  Location: [facility] ENDOSCOPY;  Service: Endoscopy;  Laterality: N/A;  . LAPAROSCOPIC APPENDECTOMY N/A 02/19/2019   Procedure: APPENDECTOMY LAPAROSCOPIC converted to open appendectomy;  Surgeon: Herbert Pun, MD;  Location: [facility] ORS;  Service: General;  Laterality: N/A;  . OOPHORECTOMY    . oophrectomy Bilateral 1992    Family History  Problem Relation Age of Onset  . Depression Mother   . Pancreatic cancer Mother   . Colon cancer Father   . Breast cancer Sister 30  . Diabetes Brother   . Breast cancer Cousin   . Neuropathy Neg Hx     Social History   Tobacco Use  . Smoking status: Former Smoker    Packs/day: 1.00    Years: 40.00    Pack years: 40.00    Last attempt to quit: 12/16/2011    Years since quitting: 7.3  . Smokeless tobacco: Never Used  Substance Use Topics  . Alcohol use: Not Currently    Alcohol/week: 0.0 standard drinks    Comment: Occasionally has a drink    Current Outpatient Medications:  .  albuterol (PROVENTIL HFA;VENTOLIN HFA) 108 (90 Base) MCG/ACT inhaler, Inhale 1-2 puffs into the lungs every 4 (four) hours as needed for wheezing or shortness of breath., Disp: 1 Inhaler, Rfl: 10 .  clonazePAM (KLONOPIN) 0.5 MG tablet, Take 1/2 tablet daily if needed for anxiety., Disp: 15 tablet, Rfl: 0 .  ELIQUIS 5 MG TABS tablet, Take 1 tablet (5 mg total) by mouth 2 (two) times daily., Disp: 180 tablet, Rfl: 3 .  esomeprazole (NEXIUM) 20 MG capsule, Take 1 capsule (20 mg total) by mouth daily., Disp: 90 capsule, Rfl: 1 .  Fluticasone-Umeclidin-Vilant (TRELEGY ELLIPTA) 100-62.5-25 MCG/INH AEPB, Inhale 1 puff into the lungs daily., Disp: 60 each, Rfl: 2 .  furosemide (LASIX) 20 MG tablet, Take 1 tablet (20 mg total) by mouth daily as needed., Disp: 90 tablet, Rfl: 2 .  gabapentin (NEURONTIN) 300 MG capsule, TAKE 1 CAPSULE BY MOUTH THREE TIMES A DAY (Patient taking differently: Take 300 mg by mouth. Take 1 capsule in AM, 1 capsule in afternoon, and 2 capsules ('600mg'$ ) at HS), Disp: 270 capsule, Rfl: 2 .  HUMIRA PEN 40 MG/0.4ML PNKT, Inject 40 mg as directed. Every 2 weeks, Disp: , Rfl:  .  hydroxychloroquine (PLAQUENIL) 200 MG tablet, Take 200 mg by mouth 2 (two)  times daily. , Disp: , Rfl:  .  ibuprofen (ADVIL,MOTRIN) 400 MG tablet, Take 400 mg by mouth every 4 (four) hours as needed. , Disp: , Rfl:  .  lidocaine (XYLOCAINE) 5 % ointment, Apply 1 application topically as needed., Disp: , Rfl:  .  nitrofurantoin, macrocrystal-monohydrate, (MACROBID) 100 MG capsule, Take 1 capsule (100 mg total) by mouth 2 (two) times daily. Take with food., Disp: 10 capsule, Rfl: 0 .  potassium chloride (K-DUR) 10 MEQ tablet, Take 1 tablet (10 mEq total) by mouth daily as needed., Disp: 90 tablet, Rfl: 2 .  propranolol ER (INDERAL LA) 80 MG 24 hr capsule, Take 1 capsule (80 mg total) by mouth daily., Disp: 90 capsule, Rfl: 3 .  rosuvastatin (CRESTOR) 10 MG tablet, Take 1 tablet (10 mg total) by mouth daily., Disp: 90 tablet, Rfl: 3 .  traMADol (ULTRAM) 50 MG tablet, Take 1 tablet (50 mg total) by mouth  every 12 (twelve) hours as needed for severe pain., Disp: 30 tablet, Rfl: 0 .  bismuth subsalicylate (PEPTO-BISMOL) 262 MG chewable tablet, Chew 2 tablets (524 mg total) by mouth 3 (three) times daily as needed for indigestion or diarrhea or loose stools., Disp: 30 tablet, Rfl: 0  EXAM:  GENERAL: alert, oriented, appears well and in no acute distress. Does not appear toxic over the video chat.   HEENT: atraumatic, conjunttiva clear, no obvious abnormalities on inspection of external nose and ears  NECK: normal movements of the head and neck  LUNGS: on inspection no signs of respiratory distress, breathing rate appears normal, no obvious gross SOB, gasping or wheezing  CV: no obvious cyanosis  MS: moves all visible extremities without noticeable abnormality.   Patient unable to show me her legs over the video chat, cannot orient camera correctly.   PSYCH/NEURO: pleasant and cooperative, no obvious depression or anxiety, speech and thought processing grossly intact  ASSESSMENT AND PLAN:  Discussed the following assessment and plan:  Nausea - Plan: bismuth  subsalicylate (PEPTO-BISMOL) 262 MG chewable tablet, CBC w/Diff, Comp Met (CMET), CANCELED: CBC w/Diff, CANCELED: Comp Met (CMET)  Diarrhea, unspecified type - Plan: bismuth subsalicylate (PEPTO-BISMOL) 262 MG chewable tablet, CBC w/Diff, Comp Met (CMET), CANCELED: CBC w/Diff, CANCELED: Comp Met (CMET)  Hypoalbuminemia - Plan: Comp Met (CMET), CANCELED: Comp Met (CMET)  Long discussion with patient in regards to her symptom and plan of care.  Advised patient that I am concerned about her pain and diarrhea related to her past history of appendicitis with perforation.  Advised patient that I strongly feel she needs blood work.  Patient states she cannot get to the office today or tomorrow for lab work, but is agreeable to come on Thursday for blood draw.    Encourage patient to keep legs elevated as much as possible to help reduce swelling, and discuss wrapping legs with Ace bandages to help improve swelling as well.  Patient's diet of fast food could also be contributory factor to the reason why her legs are so swollen.  Patient will trial low-dose Pepto-Bismol to see if this helps reduce symptoms of nausea and diarrhea.  Patient advised that Pepto-Bismol can darken the color of the stools due to the bismuth ingredient and Pepto-Bismol.  Also discussed with patient that I feel her diet of fast food intake out for the past few weeks is most likely a contributing factor to the reason why she has diarrhea and nauseousness.  Advised that fast food intake out is often full of grease, salt, butter.  All of these ingredients are very heavy and tough on the stomach.  Discussed eating a bland diet and drinking clear liquids over the next few days.  Suggested things such as water, Gatorade, Pedialyte, chicken broth, Ramen noodles, Jell-O, plain toast, white rice and then slowly advance diet as tolerated.  Discussed alarm symptoms that would warrant patient calling office right away and or go to emergency room for  evaluation such as severe abdominal pain, development of fever, chest pain, shortness of breath, worsening diarrhea, development of vomiting.   I discussed the assessment and treatment plan with the patient. The patient was provided an opportunity to ask questions and all were answered. The patient agreed with the plan and demonstrated an understanding of the instructions.   The patient was advised to call back or seek an in-person evaluation if the symptoms worsen or if the condition fails to improve as anticipated.  25 minutes spent  with patient via video discussing plan of care  Jodelle Green, FNP

## 2019-04-13 NOTE — Telephone Encounter (Signed)
Returned pt call.[email or web address] 

## 2019-04-13 NOTE — Telephone Encounter (Signed)
Spoke with the pt. Pt sts that she has not had any improvement in her LE edema with the increased Lasix. She is back down to taking Lasix 20mg  every other day. She hasn't noticed an increase in urine output on the days she takes lasix. Pt sts that her weights have been stable. Her BP and HR are good. Pt sts that she is unable to wear compression stockings, her legs are sore and she is unable to get them on. She denies sob, orthopnea, PND.  Pt also complains of decreased appetite, intermittent diarrhea, and nausea. She completed her antibiotic prescribed by her pcp for a suspected UTI a couple of days ago. Adv her to contact her pcp regarding those symptoms.  Adv the pt that I will fwd Christell Faith, PA an update regarding her LE edema and call back with his response. Pt verbalized understanding.

## 2019-04-13 NOTE — Telephone Encounter (Signed)
Patient returning call.

## 2019-04-13 NOTE — Telephone Encounter (Signed)
Patient verbalised understanding of Ryan's recommendations and to call her PCP regarding low albumin.

## 2019-04-13 NOTE — Telephone Encounter (Signed)
Less likely related to her heart given improved rate and reassuring echo. Her albumin was low in 01/2019 which could lead to third spacing in her legs leading to swelling. She soul elevate her legs and wear ACE wraps if she is not able to wear compression hose. She should follow up with her PCP regarding hypoalbuminemia.

## 2019-04-13 NOTE — Telephone Encounter (Signed)
Patient calling States that her leg swelling has not improved and is now feeling sick to stomach  Would like to speak with nurse and would like to know if maybe she should have another appointment Please call to discuss

## 2019-04-14 ENCOUNTER — Encounter: Payer: Self-pay | Admitting: Family Medicine

## 2019-04-14 ENCOUNTER — Encounter: Payer: Self-pay | Admitting: Family

## 2019-04-14 DIAGNOSIS — I5032 Chronic diastolic (congestive) heart failure: Secondary | ICD-10-CM | POA: Diagnosis not present

## 2019-04-14 DIAGNOSIS — K2270 Barrett's esophagus without dysplasia: Secondary | ICD-10-CM | POA: Diagnosis not present

## 2019-04-14 DIAGNOSIS — Z48815 Encounter for surgical aftercare following surgery on the digestive system: Secondary | ICD-10-CM | POA: Diagnosis not present

## 2019-04-14 DIAGNOSIS — Z7951 Long term (current) use of inhaled steroids: Secondary | ICD-10-CM | POA: Diagnosis not present

## 2019-04-14 DIAGNOSIS — J449 Chronic obstructive pulmonary disease, unspecified: Secondary | ICD-10-CM | POA: Diagnosis not present

## 2019-04-14 DIAGNOSIS — E559 Vitamin D deficiency, unspecified: Secondary | ICD-10-CM | POA: Diagnosis not present

## 2019-04-14 DIAGNOSIS — I110 Hypertensive heart disease with heart failure: Secondary | ICD-10-CM | POA: Diagnosis not present

## 2019-04-14 DIAGNOSIS — Z9089 Acquired absence of other organs: Secondary | ICD-10-CM | POA: Diagnosis not present

## 2019-04-14 DIAGNOSIS — K219 Gastro-esophageal reflux disease without esophagitis: Secondary | ICD-10-CM | POA: Diagnosis not present

## 2019-04-14 DIAGNOSIS — I482 Chronic atrial fibrillation, unspecified: Secondary | ICD-10-CM | POA: Diagnosis not present

## 2019-04-14 DIAGNOSIS — M069 Rheumatoid arthritis, unspecified: Secondary | ICD-10-CM | POA: Diagnosis not present

## 2019-04-14 DIAGNOSIS — M1711 Unilateral primary osteoarthritis, right knee: Secondary | ICD-10-CM | POA: Diagnosis not present

## 2019-04-14 DIAGNOSIS — Z9181 History of falling: Secondary | ICD-10-CM | POA: Diagnosis not present

## 2019-04-14 DIAGNOSIS — G629 Polyneuropathy, unspecified: Secondary | ICD-10-CM | POA: Diagnosis not present

## 2019-04-14 DIAGNOSIS — Z85038 Personal history of other malignant neoplasm of large intestine: Secondary | ICD-10-CM | POA: Diagnosis not present

## 2019-04-14 DIAGNOSIS — Z87891 Personal history of nicotine dependence: Secondary | ICD-10-CM | POA: Diagnosis not present

## 2019-04-14 DIAGNOSIS — D649 Anemia, unspecified: Secondary | ICD-10-CM | POA: Diagnosis not present

## 2019-04-14 NOTE — Telephone Encounter (Signed)
Virtual Visit Pre-Appointment Phone Call  Steps For Call:  1. Confirm consent - "In the setting of the current Covid19 crisis, you are scheduled for a (phone or video) visit with your provider on (date) at (time).  Just as we do with many in-office visits, in order for you to participate in this visit, we must obtain consent.  If you'd like, I can send this to your mychart (if signed up) or email for you to review.  Otherwise, I can obtain your verbal consent now.  All virtual visits are billed to your insurance company just like a normal visit would be.  By agreeing to a virtual visit, we'd like you to understand that the technology does not allow for your provider to perform an examination, and thus may limit your provider's ability to fully assess your condition.  Finally, though the technology is pretty good, we cannot assure that it will always work on either your or our end, and in the setting of a video visit, we may have to convert it to a phone-only visit.  In either situation, we cannot ensure that we have a secure connection.  Are you willing to proceed?" STAFF: Did the patient verbally acknowledge consent to telehealth visit? Document YES/NO here: YES  2. Confirm the BEST phone number to call the day of the visit by including in appointment notes  3. Give patient instructions for WebEx/MyChart download to smartphone as below or Doximity/Doxy.me if video visit (depending on what platform provider is using)  4. Advise patient to be prepared with their blood pressure, heart rate, weight, any heart rhythm information, their current medicines, and a piece of paper and pen handy for any instructions they may receive the day of their visit  5. Inform patient they will receive a phone call 15 minutes prior to their appointment time (may be from unknown caller ID) so they should be prepared to answer  6. Confirm that appointment type is correct in Epic appointment notes (VIDEO vs PHONE)      TELEPHONE CALL NOTE  ATLANTIS DELONG has been deemed a candidate for a follow-up tele-health visit to limit community exposure during the Covid-19 pandemic. I spoke with the patient via phone to ensure availability of phone/video source, confirm preferred email & phone number, and discuss instructions and expectations.  I reminded Brandy Armstrong to be prepared with any vital sign and/or heart rhythm information that could potentially be obtained via home monitoring, at the time of her visit. I reminded JASMINA GENDRON to expect a phone call at the time of her visit if her visit.  Janan Ridge, [place] 04/14/2019 12:27 PM   INSTRUCTIONS FOR DOWNLOADING THE [place] APP TO SMARTPHONE  - If Apple, ask patient to go to [employer] and type in WebEx in the search bar. [place] [employer], the blue/green circle. If Android, go to [employer] and type in [employer] in the search bar. The app is free but as with any other app downloads, their phone may require them to verify saved payment information or Apple/Android password.  - The patient does NOT have to create an account. - On the day of the visit, the assist will walk the patient through joining the meeting with the meeting number/password.  INSTRUCTIONS FOR DOWNLOADING THE MYCHART APP TO SMARTPHONE  - The patient must first make sure to have activated MyChart and know their login information - If Apple, go to [employer] and type  in [place] in the search bar and download the app. If Android, ask patient to go to [employer] and type in [place] in the search bar and download the app. The app is free but as with any other app downloads, their phone may require them to verify saved payment information or Apple/Android password.  - The patient will need to then log into the app with their MyChart username and password, and select [facility] as their healthcare provider to link the account. When it is time for your visit, go to the MyChart  app, find appointments, and click Begin Video Visit. Be sure to Select Allow for your device to access the Microphone and Camera for your visit. You will then be connected, and your provider will be with you shortly.  **If they have any issues connecting, or need assistance please contact MyChart service desk [phone])**  **If using a computer, in order to ensure the best quality for their visit they will need to use either of the following Internet Browsers: [employer], or Google Chrome**  IF USING DOXIMITY or DOXY.ME - The patient will receive a link just prior to their visit, either by text or email (to be determined day of appointment depending on if it's doxy.me or Doximity).     FULL LENGTH CONSENT FOR TELE-HEALTH VISIT   I hereby voluntarily request, consent and authorize [facility] and its employed or contracted physicians, physician assistants, nurse practitioners or other licensed health care professionals (the Practitioner), to provide me with telemedicine health care services (the "Services") as deemed necessary by the treating Practitioner. I acknowledge and consent to receive the Services by the Practitioner via telemedicine. I understand that the telemedicine visit will involve communicating with the Practitioner through live audiovisual communication technology and the disclosure of certain medical information by electronic transmission. I acknowledge that I have been given the opportunity to request an in-person assessment or other available alternative prior to the telemedicine visit and am voluntarily participating in the telemedicine visit.  I understand that I have the right to withhold or withdraw my consent to the use of telemedicine in the course of my care at any time, without affecting my right to future care or treatment, and that the Practitioner or I may terminate the telemedicine visit at any time. I understand that I have the right to inspect all  information obtained and/or recorded in the course of the telemedicine visit and may receive copies of available information for a reasonable fee.  I understand that some of the potential risks of receiving the Services via telemedicine include:  [name] Delay or interruption in medical evaluation due to technological equipment failure or disruption; . Information transmitted may not be sufficient (e.g. poor resolution of images) to allow for appropriate medical decision making by the Practitioner; and/or  . In rare instances, security protocols could fail, causing a breach of personal health information.  Furthermore, I acknowledge that it is my responsibility to provide information about my medical history, conditions and care that is complete and accurate to the best of my ability. I acknowledge that Practitioner's advice, recommendations, and/or decision may be based on factors not within their control, such as incomplete or inaccurate data provided by me or distortions of diagnostic images or specimens that may result from electronic transmissions. I understand that the practice of medicine is not an exact science and that Practitioner makes no warranties or guarantees regarding treatment outcomes. I acknowledge that I will receive a  copy of this consent concurrently upon execution via email to the email address I last provided but may also request a printed copy by calling the office of [facility].    I understand that my insurance will be billed for this visit.   I have read or had this consent read to me. . I understand the contents of this consent, which adequately explains the benefits and risks of the Services being provided via telemedicine.  . I have been provided ample opportunity to ask questions regarding this consent and the Services and have had my questions answered to my satisfaction. . I give my informed consent for the services to be provided through the use of telemedicine in my  medical care  By participating in this telemedicine visit I agree to the above.

## 2019-04-15 ENCOUNTER — Ambulatory Visit (INDEPENDENT_AMBULATORY_CARE_PROVIDER_SITE_OTHER): Payer: Medicare Other | Admitting: Family Medicine

## 2019-04-15 ENCOUNTER — Other Ambulatory Visit (INDEPENDENT_AMBULATORY_CARE_PROVIDER_SITE_OTHER): Payer: Medicare Other

## 2019-04-15 ENCOUNTER — Encounter: Payer: Self-pay | Admitting: Family Medicine

## 2019-04-15 ENCOUNTER — Other Ambulatory Visit: Payer: Self-pay

## 2019-04-15 VITALS — BP 118/88 | HR 101 | Temp 97.7°F | Resp 20 | Ht 66.0 in | Wt 212.6 lb

## 2019-04-15 DIAGNOSIS — E8809 Other disorders of plasma-protein metabolism, not elsewhere classified: Secondary | ICD-10-CM

## 2019-04-15 DIAGNOSIS — R110 Nausea: Secondary | ICD-10-CM

## 2019-04-15 DIAGNOSIS — R197 Diarrhea, unspecified: Secondary | ICD-10-CM

## 2019-04-15 DIAGNOSIS — R5381 Other malaise: Secondary | ICD-10-CM

## 2019-04-15 DIAGNOSIS — M7989 Other specified soft tissue disorders: Secondary | ICD-10-CM | POA: Diagnosis not present

## 2019-04-15 LAB — CBC WITH DIFFERENTIAL/PLATELET
Basophils Absolute: 0 10*3/uL (ref 0.0–0.1)
Basophils Relative: 0.6 % (ref 0.0–3.0)
Eosinophils Absolute: 0.1 10*3/uL (ref 0.0–0.7)
Eosinophils Relative: 2.9 % (ref 0.0–5.0)
HCT: 34.9 % — ABNORMAL LOW (ref 36.0–46.0)
Hemoglobin: 11.1 g/dL — ABNORMAL LOW (ref 12.0–15.0)
Lymphocytes Relative: 28.6 % (ref 12.0–46.0)
Lymphs Abs: 1.3 10*3/uL (ref 0.7–4.0)
MCHC: 31.7 g/dL (ref 30.0–36.0)
MCV: 87.4 fl (ref 78.0–100.0)
Monocytes Absolute: 0.5 10*3/uL (ref 0.1–1.0)
Monocytes Relative: 9.8 % (ref 3.0–12.0)
Neutro Abs: 2.7 10*3/uL (ref 1.4–7.7)
Neutrophils Relative %: 58.1 % (ref 43.0–77.0)
Platelets: 205 10*3/uL (ref 150.0–400.0)
RBC: 4 Mil/uL (ref 3.87–5.11)
RDW: 16 % — ABNORMAL HIGH (ref 11.5–15.5)
WBC: 4.6 10*3/uL (ref 4.0–10.5)

## 2019-04-15 LAB — COMPREHENSIVE METABOLIC PANEL
ALT: 7 U/L (ref 0–35)
AST: 21 U/L (ref 0–37)
Albumin: 3.1 g/dL — ABNORMAL LOW (ref 3.5–5.2)
Alkaline Phosphatase: 82 U/L (ref 39–117)
BUN: 9 mg/dL (ref 6–23)
CO2: 29 mEq/L (ref 19–32)
Calcium: 8.7 mg/dL (ref 8.4–10.5)
Chloride: 102 mEq/L (ref 96–112)
Creatinine, Ser: 0.87 mg/dL (ref 0.40–1.20)
GFR: 64.24 mL/min (ref >60.00)
Glucose, Bld: 81 mg/dL (ref 70–99)
Potassium: 3.8 mEq/L (ref 3.5–5.1)
Sodium: 139 mEq/L (ref 135–145)
Total Bilirubin: 0.6 mg/dL (ref 0.2–1.2)
Total Protein: 6.2 g/dL (ref 6.0–8.3)

## 2019-04-15 LAB — TSH: TSH: 4.7 u[IU]/mL — ABNORMAL HIGH (ref 0.35–4.50)

## 2019-04-15 NOTE — Progress Notes (Signed)
Subjective:    Patient ID: Brandy Armstrong, female    DOB: 10/28/1946, 73 y.o.   MRN: [id number]  HPI   Patient presents to clinic due to swelling of both lower extremities.  Patient and I connected via video visit Tuesday for  4.14.2020 due to complaints of diarrhea and nausea.  Patient was prescribed Pepto-Bismol and advised to change to more bland clear liquid diet and avoid fast food which she has been eating.  Patient states her diarrhea and nausea have improved.  Now her main concern is lower extremity swelling.  Patient has taken Lasix daily for the past 3 days as advised by cardiology, but this did not seem to make much of a difference in leg swelling.  Prior to taking Lasix daily for the past 3 days she has been on a normal dosing regimen of Lasix every other day.  Patient states she mainly sits at home for most of the day, has not been elevating her feet very high, will sometimes put them up on a little stool but her feet are not elevated above her heart level.  Patient does not wear any support of support stockings or TED hose.  Patient is slowly becoming more mobile after being in the ICU related to her appendicitis with perforation. She went to rehab after ICU stay and was sent home  She is using walker when she is walking and has been trying to further distances each day, but does not walk far while at home.   Patient Active Problem List   Diagnosis Date Noted  . Loose stools 04/07/2019  . Anxiety 04/02/2019  . Acute appendicitis   . Acute appendicitis with localized peritonitis 02/19/2019  . Chronic diastolic heart failure ([place]) 02/10/2019  . Atherosclerosis of aorta ([place]) 02/05/2019  . AAA (abdominal aortic aneurysm) without rupture ([place]) 12/17/2018  . COPD exacerbation ([place]) 12/09/2018  . Iron deficiency anemia due to chronic blood loss 11/03/2018  . Obesity, Class II, BMI 35-39.9 09/23/2018  . Primary osteoarthritis of right knee 09/23/2018  . GERD (gastroesophageal reflux  disease) 08/24/2018  . Hives 06/17/2018  . B12 deficiency 06/17/2018  . Depression, recurrent ([place]) 03/23/2018  . Purpura ([place]) 08/06/2017  . Radicular pain in left arm 09/30/2016  . Cellulitis 09/26/2016  . Abscess 09/24/2016  . PAD (peripheral artery disease) ([place]) 08/13/2016  . Anemia 08/13/2016  . Urinary urgency 08/06/2016  . Chronic back pain 05/20/2016  . Neuropathy 10/05/2015  . Tremors of nervous system 06/18/2015  . GI bleed due to NSAIDs 05/04/2015  . Dysuria 03/07/2015  . Atrial fibrillation with rapid ventricular response ([place]) 03/11/2014  . COPD (chronic obstructive pulmonary disease) ([place]) 03/11/2014  . Flexor hallucis longus tendinitis 11/09/2013  . Vertigo 09/16/2013  . Chronic steroid use 09/11/2013  . Osteoarthritis 09/11/2013  . Shortness of breath 08/18/2013  . Fatigue 08/18/2013  . Barrett's esophagus 08/18/2013  . Chronic diarrhea 08/18/2013  . Numbness and tingling 08/18/2013  . Screening for breast cancer 08/18/2013  . Rheumatoid arthritis ([place]) 05/31/2013  . Seborrheic keratosis 12/14/2012  . Benign neoplasm of colon 11/23/2012  . Family history of malignant neoplasm of gastrointestinal tract 11/23/2012  . Prediabetes 12/16/2011   Social History   Tobacco Use  . Smoking status: Former Smoker    Packs/day: 1.00    Years: 40.00    Pack years: 40.00    Last attempt to quit: 12/16/2011    Years since quitting: 7.3  . Smokeless tobacco: Never Used  Substance Use  Topics  . Alcohol use: Not Currently    Alcohol/week: 0.0 standard drinks    Comment: Occasionally has a drink   Review of Systems  Constitutional: Negative for chills, fatigue and fever.  HENT: Negative for congestion, ear pain, sinus pain and sore throat.   Eyes: Negative.   Respiratory: Negative for cough, shortness of breath and wheezing.   Cardiovascular: Negative for chest pain, palpitations. +LE edema  Gastrointestinal: Negative for abdominal pain, diarrhea, nausea and  vomiting.  Genitourinary: Negative for dysuria, frequency and urgency.  Musculoskeletal: Negative for arthralgias and myalgias.  Skin: Negative for color change, pallor and rash.  Neurological: Negative for syncope, light-headedness and headaches.  Psychiatric/Behavioral: The patient is not nervous/anxious.       Objective:   Physical Exam Vitals signs and nursing note reviewed.  Constitutional:      General: She is not in acute distress.    Appearance: She is not ill-appearing, toxic-appearing or diaphoretic.  HENT:     Head: Normocephalic and atraumatic.  Eyes:     General: No scleral icterus.    Extraocular Movements: Extraocular movements intact.     Pupils: Pupils are equal, round, and reactive to light.  Neck:     Musculoskeletal: Neck supple. No neck rigidity.  Cardiovascular:     Rate and Rhythm: Normal rate and regular rhythm.  Pulmonary:     Effort: Pulmonary effort is normal. No respiratory distress.     Breath sounds: Normal breath sounds. No wheezing, rhonchi or rales.  Chest:     Chest wall: No tenderness.  Abdominal:     General: Bowel sounds are normal. There is no distension.     Palpations: Abdomen is soft.     Tenderness: There is no abdominal tenderness.  Musculoskeletal:     Right lower leg: Edema (+2 pitting) present.     Left lower leg: Edema (+2 pitting) present.  Skin:    General: Skin is warm and dry.     Capillary Refill: Capillary refill takes less than 2 seconds.     Coloration: Skin is not pale.     Findings: No erythema.  Neurological:     Mental Status: She is alert and oriented to person, place, and time.  Psychiatric:        Mood and Affect: Mood normal.        Behavior: Behavior normal.    Vitals:   04/15/19 0930  BP: 118/88  Pulse: (!) 101  Resp: 20  Temp: 97.7 F (36.5 C)  SpO2: 95%      Assessment & Plan:    Hypoalbuminemia/multiple extremities, physical deconditioning- patient did have low albumin levels while in the  ICU.  Lab work is pending to be checked this I should have results back by MD.  I wrapped patient's legs with Ace bandages (2 ace bandages given to patient from clinic supply) to give compression to help reduce the swelling.  Patient advised that she must elevate legs for at least a few hours throughout the day, and that elevation must be at least 90 degrees or more.  Advised patient that just putting her feet up on a little stool is not elevating them high enough to help reduce the swelling.  Patient encouraged to continue to walk short distances throughout the day with his walker to help improve her physical conditioning.  Reassured patient that she is doing well after her long ICU stay, and going to rehab-advised patient that being in the ICU can with  how ill she was, she became physically deconditioned and we are working to slowly get her back to her normal baseline.  Diarrhea/nausea-the symptoms seem to have improved with use of Pepto-Bismol tablets and cutting out greasy foods.  Advised to eat a healthy diet with lean proteins, lots of vegetables, fruits and keep up good water intake  Patient will keep regularly scheduled follow-up with PCP as planned.  Advised to call clinic right away if any issues arise.  Patient advised we will most likely call her later on today when we have lab results and make any medications required her lab results.  Advised to call office right away if she develops chest pain, shortness of breath, dizziness or weakness.

## 2019-04-16 DIAGNOSIS — G629 Polyneuropathy, unspecified: Secondary | ICD-10-CM | POA: Diagnosis not present

## 2019-04-16 DIAGNOSIS — Z9089 Acquired absence of other organs: Secondary | ICD-10-CM | POA: Diagnosis not present

## 2019-04-16 DIAGNOSIS — Z7951 Long term (current) use of inhaled steroids: Secondary | ICD-10-CM | POA: Diagnosis not present

## 2019-04-16 DIAGNOSIS — M1711 Unilateral primary osteoarthritis, right knee: Secondary | ICD-10-CM | POA: Diagnosis not present

## 2019-04-16 DIAGNOSIS — Z87891 Personal history of nicotine dependence: Secondary | ICD-10-CM | POA: Diagnosis not present

## 2019-04-16 DIAGNOSIS — J449 Chronic obstructive pulmonary disease, unspecified: Secondary | ICD-10-CM | POA: Diagnosis not present

## 2019-04-16 DIAGNOSIS — K219 Gastro-esophageal reflux disease without esophagitis: Secondary | ICD-10-CM | POA: Diagnosis not present

## 2019-04-16 DIAGNOSIS — I110 Hypertensive heart disease with heart failure: Secondary | ICD-10-CM | POA: Diagnosis not present

## 2019-04-16 DIAGNOSIS — K2270 Barrett's esophagus without dysplasia: Secondary | ICD-10-CM | POA: Diagnosis not present

## 2019-04-16 DIAGNOSIS — Z9181 History of falling: Secondary | ICD-10-CM | POA: Diagnosis not present

## 2019-04-16 DIAGNOSIS — I482 Chronic atrial fibrillation, unspecified: Secondary | ICD-10-CM | POA: Diagnosis not present

## 2019-04-16 DIAGNOSIS — I5032 Chronic diastolic (congestive) heart failure: Secondary | ICD-10-CM | POA: Diagnosis not present

## 2019-04-16 DIAGNOSIS — D649 Anemia, unspecified: Secondary | ICD-10-CM | POA: Diagnosis not present

## 2019-04-16 DIAGNOSIS — M069 Rheumatoid arthritis, unspecified: Secondary | ICD-10-CM | POA: Diagnosis not present

## 2019-04-16 DIAGNOSIS — Z85038 Personal history of other malignant neoplasm of large intestine: Secondary | ICD-10-CM | POA: Diagnosis not present

## 2019-04-16 DIAGNOSIS — E559 Vitamin D deficiency, unspecified: Secondary | ICD-10-CM | POA: Diagnosis not present

## 2019-04-16 DIAGNOSIS — Z48815 Encounter for surgical aftercare following surgery on the digestive system: Secondary | ICD-10-CM | POA: Diagnosis not present

## 2019-04-17 NOTE — Progress Notes (Signed)
Virtual Visit via Telephone Note   This visit type was conducted due to national recommendations for restrictions regarding the COVID-19 Pandemic (e.g. social distancing) in an effort to limit this patient's exposure and mitigate transmission in our community.  Due to her co-morbid illnesses, this patient is at least at moderate risk for complications without adequate follow up.  This format is felt to be most appropriate for this patient at this time.  The patient did not have access to video technology/had technical difficulties with video requiring transitioning to audio format only (telephone).  All issues noted in this document were discussed and addressed.  No physical exam could be performed with this format.  Please refer to the patient's chart for her  consent to telehealth for [facility].   I connected with  Brandy Armstrong on 04/17/19 by a video enabled telemedicine application and verified that I am speaking with the correct person using two identifiers. I discussed the limitations of evaluation and management by telemedicine. The patient expressed understanding and agreed to proceed.   Evaluation Performed:  Follow-up visit  Date:  04/17/2019   ID:  Brandy Armstrong, DOB [birth year], MRN [id number]  Patient Location:  [address] [place] [address]   Provider location:   [facility], [place] office  PCP:  [name], [place]  Cardiologist:  [place], [place]   Chief Complaint:  SOB    History of Present Illness:    Brandy Armstrong is a 73 y.o. female who presents via audio/video conferencing for a telehealth visit today.   The patient does not symptoms concerning for COVID-19 infection (fever, chills, cough, or new SHORTNESS OF BREATH).   Patient has a past medical history of COPD, smoked quit 2012  Depression  paroxysmal Atrial fibrillation Normal EF >55% in 11/2016 at [facility] s/p ablation 2013 and 2016 and had recurrence. No further  bleeding on apixaban. Previously seen by cardiology at [facility], Last seen February 2018 Who presents for follow-up of her permanent atrial fibrillation   Recent events , in the hospital Acute appendicitis with localized peritonitis Laparoscopic converted to open cholecystectomy Atrial fibrillation with rapid ventricular response Rx with IV amiodarone for rate control IV antibiotic therapy clinical deterioration with fever and altered mental status and worsening abdominal pain severe encephalopathy for around 4 to 5 days. Chronic atrial fib  In recovery Still weak, using a walker Has leg swelling, b/l  Labs reviewed Albumin 3.1 Taking lasix QOD  Weight: 210 Lost weight, used  trouble with shortness of breath and leg swelling  Lab work reviewed with her Normal creatinine  Other past medical history reviewed  AF ablation by Dr. Isaias Sakai in early 2013  recurrence when sotalol was stopped.  repeat procedure on 02/21/15.  required DCCV on 03/21/15.  Recurrence of her atrial fibrillation and change to amiodarone gastrointestinal bleeding and xarelto was stopped and aspirin started.  She has since been put on apixaban DCCV 08/2016 for recurrent AF with restoration of sinus rhythm. recurrence of AF about a month later. amiodarone has been discontinued  Previous discussions concerning rate-control, increased dose of amiodarone, repeat catheter ablation, hybrid ablation, and pacemaker placement with AV node ablation.     Prior CV studies:   The following studies were reviewed today:  2D Echocardiogram2.22.2020  1. The left ventricle has hyperdynamic systolic function, with an ejection fraction of >65%. The cavity size was normal. There is mildly increased left ventricular wall  thickness. Left ventricular diastology could not be evaluated secondary to atrial  fibrillation. 2. The right ventricle has normal systolic function. The cavity was normal. There is no increase  in right ventricular wall thickness. 3. The mitral valve is normal in structure. 4. The tricuspid valve is normal in structure. 5. The aortic valve is normal in structure. Mild thickening of the aortic valve no stenosis of the aortic valve. 6. The interatrial septum was not assessed.  Echocardiogram preserved ejection fraction, 10/2016 Results discussed with her, grossly normal with no estimation of right heart pressures  Stress test 2015, low risk  CT ABD 2015 ABD athero   Past Medical History:  Diagnosis Date  . Barretts esophagus   . Chest pain    a. 02/2014 Myoview: Ef 50%, no ischemia.  . Colon polyps   . COPD (chronic obstructive pulmonary disease) ([place])   . Depression with anxiety   . GERD (gastroesophageal reflux disease)   . Neuropathy   . Permanent atrial fibrillation    a. s/p ablation 01/2012 in [facility] by Dr. Boyd Kerbs;  b. On sotalol & Xarelto;  c. 02/2014 Echo: EF 50-55%, mild conc LVH, nl LA size/structure;  d. Recurrent afib 8/15 & 08/29/2014.  [name] Rectal fistula   . Rheumatoid arthritis ([place])    On methotrexate and orencia  . Urinary incontinence   . Vitamin D deficiency    Past Surgical History:  Procedure Laterality Date  . ABDOMINAL HYSTERECTOMY  1992  . CARDIAC ELECTROPHYSIOLOGY STUDY AND ABLATION  2013  . CHOLECYSTECTOMY  1987  . COLONOSCOPY N/A 05/08/2015   Procedure: COLONOSCOPY;  Surgeon: Manya Silvas, MD;  Location: [facility] ENDOSCOPY;  Service: Endoscopy;  Laterality: N/A;  . ESOPHAGOGASTRODUODENOSCOPY N/A 05/06/2015   Procedure: ESOPHAGOGASTRODUODENOSCOPY (EGD);  Surgeon: Lollie Sails, MD;  Location: [facility] ENDOSCOPY;  Service: Endoscopy;  Laterality: N/A;  . LAPAROSCOPIC APPENDECTOMY N/A 02/19/2019   Procedure: APPENDECTOMY LAPAROSCOPIC converted to open appendectomy;  Surgeon: Herbert Pun, MD;  Location: [facility] ORS;  Service: General;  Laterality: N/A;  . OOPHORECTOMY    . oophrectomy Bilateral 1992     No outpatient medications  have been marked as taking for the 04/19/19 encounter (Appointment) with Minna Merritts, MD.     Allergies:   Latex   Social History   Tobacco Use  . Smoking status: Former Smoker    Packs/day: 1.00    Years: 40.00    Pack years: 40.00    Last attempt to quit: 12/16/2011    Years since quitting: 7.3  . Smokeless tobacco: Never Used  Substance Use Topics  . Alcohol use: Not Currently    Alcohol/week: 0.0 standard drinks    Comment: Occasionally has a drink  . Drug use: No     Current Outpatient Medications on File Prior to Visit  Medication Sig Dispense Refill  . albuterol (PROVENTIL HFA;VENTOLIN HFA) 108 (90 Base) MCG/ACT inhaler Inhale 1-2 puffs into the lungs every 4 (four) hours as needed for wheezing or shortness of breath. 1 Inhaler 10  . bismuth subsalicylate (PEPTO-BISMOL) 262 MG chewable tablet Chew 2 tablets (524 mg total) by mouth 3 (three) times daily as needed for indigestion or diarrhea or loose stools. 30 tablet 0  . clonazePAM (KLONOPIN) 0.5 MG tablet Take 1/2 tablet daily if needed for anxiety. 15 tablet 0  . ELIQUIS 5 MG TABS tablet Take 1 tablet (5 mg total) by mouth 2 (two) times daily. 180 tablet 3  . esomeprazole (NEXIUM) 20 MG capsule Take  1 capsule (20 mg total) by mouth daily. 90 capsule 1  . Fluticasone-Umeclidin-Vilant (TRELEGY ELLIPTA) 100-62.5-25 MCG/INH AEPB Inhale 1 puff into the lungs daily. 60 each 2  . furosemide (LASIX) 20 MG tablet Take 1 tablet (20 mg total) by mouth daily as needed. 90 tablet 2  . gabapentin (NEURONTIN) 300 MG capsule TAKE 1 CAPSULE BY MOUTH THREE TIMES A DAY (Patient taking differently: Take 300 mg by mouth. Take 1 capsule in AM, 1 capsule in afternoon, and 2 capsules (600mg ) at HS) 270 capsule 2  . HUMIRA PEN 40 MG/0.4ML PNKT Inject 40 mg as directed. Every 2 weeks    . hydroxychloroquine (PLAQUENIL) 200 MG tablet Take 200 mg by mouth 2 (two) times daily.     [name] ibuprofen (ADVIL,MOTRIN) 400 MG tablet Take 400 mg by mouth every  4 (four) hours as needed.     . lidocaine (XYLOCAINE) 5 % ointment Apply 1 application topically as needed.    . nitrofurantoin, macrocrystal-monohydrate, (MACROBID) 100 MG capsule Take 1 capsule (100 mg total) by mouth 2 (two) times daily. Take with food. 10 capsule 0  . potassium chloride (K-DUR) 10 MEQ tablet Take 1 tablet (10 mEq total) by mouth daily as needed. 90 tablet 2  . propranolol ER (INDERAL LA) 80 MG 24 hr capsule Take 1 capsule (80 mg total) by mouth daily. 90 capsule 3  . rosuvastatin (CRESTOR) 10 MG tablet Take 1 tablet (10 mg total) by mouth daily. 90 tablet 3  . traMADol (ULTRAM) 50 MG tablet Take 1 tablet (50 mg total) by mouth every 12 (twelve) hours as needed for severe pain. 30 tablet 0   No current facility-administered medications on file prior to visit.      Family Hx: The patient's family history includes Breast cancer in her cousin; Breast cancer (age of onset: 54) in her sister; Colon cancer in her father; Depression in her mother; Diabetes in her brother; Pancreatic cancer in her mother. There is no history of Neuropathy.  ROS:   Please see the history of present illness.    Review of Systems  Constitutional: Negative.   Respiratory: Positive for shortness of breath.   Cardiovascular: Positive for leg swelling.  Gastrointestinal: Negative.   Musculoskeletal: Negative.   Neurological: Negative.   Psychiatric/Behavioral: Negative.   All other systems reviewed and are negative.     Labs/Other Tests and Data Reviewed:    Recent Labs: 03/02/2019: Magnesium 1.9 04/15/2019: ALT 7; BUN 9; Creatinine, Ser 0.87; Hemoglobin 11.1; Platelets 205.0; Potassium 3.8; Sodium 139; TSH 4.70   Recent Lipid Panel Lab Results  Component Value Date/Time   CHOL 155 10/12/2018 10:40 AM   TRIG 3,560 (H) 02/19/2019 10:57 PM   HDL 38.90 (L) 10/12/2018 10:40 AM   CHOLHDL 4 10/12/2018 10:40 AM   LDLCALC 80 10/12/2018 10:40 AM    Wt Readings from Last 3 Encounters:  04/15/19  212 lb 9.6 oz (96.4 kg)  03/31/19 207 lb (93.9 kg)  03/08/19 204 lb 5.9 oz (92.7 kg)     Exam:    Vital Signs: Vital signs may also be detailed in the HPI There were no vitals taken for this visit.  Wt Readings from Last 3 Encounters:  04/15/19 212 lb 9.6 oz (96.4 kg)  03/31/19 207 lb (93.9 kg)  03/08/19 204 lb 5.9 oz (92.7 kg)   Temp Readings from Last 3 Encounters:  04/15/19 97.7 F (36.5 C) (Oral)  03/08/19 98 F (36.7 C) (Oral)  02/10/19 97.9 F (36.6  C)   BP Readings from Last 3 Encounters:  04/15/19 118/88  03/31/19 (!) 118/96  03/08/19 124/73   Pulse Readings from Last 3 Encounters:  04/15/19 (!) 101  03/31/19 (!) 117  03/08/19 (!) 101     120/70, Pulse 90 resp 16  Well nourished, well developed female in no acute distress. Constitutional:  oriented to person, place, and time. No distress.    ASSESSMENT & PLAN:    Chronic diastolic CHF (congestive heart failure) ([place]) She reports weight is lower but leg swelling is severe Legs are tender consistent with diastolic heart failure Recommend she increase Lasix up to 20 twice daily for 3 days then down to 20 daily Lab work reviewed, recent normal BMP We will talk with her again next week Recommend she avoid excessive fluid intake Suspect goal weight will be close to 200 Current weight 210  Atrial fibrillation with RVR (HCC) Permanent atrial fibrillation  Difficulty controlling rate in the hospital, amiodarone was required On Eliquis and beta-blocker  other emphysema (HCC) Chronic stable shortness of breath Perhaps worse recently in the setting of fluid overload Plan as above  Generalized weakness Still walking with a walker, recovering after recent hospitalization   atherosclerosis of aorta (HCC) Lipid panel should improve with recent weight loss Goal LDL less than 70  Anxiety Stable  Shortness of breath Plan for diuresis as above  Chronic fatigue Recommended regular walking   COVID-19  Education: The signs and symptoms of COVID-19 were discussed with the patient and how to seek care for testing (follow up with PCP or arrange E-visit).  The importance of social distancing was discussed today.  Patient Risk:   After full review of this patients clinical status, I feel that they are at least moderate risk at this time.  Time:   Today, I have spent 25 minutes with the patient with telehealth technology discussing the cardiac and medical problems/diagnoses detailed above   10 min spent reviewing the chart prior to patient visit today   Medication Adjustments/Labs and Tests Ordered: Current medicines are reviewed at length with the patient today.  Concerns regarding medicines are outlined above.   Tests Ordered: No tests ordered   Medication Changes: Extra Lasix as detailed above   Disposition: Follow-up in 8 days   Signed, Ida Rogue, MD  04/17/2019 1:21 PM    [place] Office [address] [place] [address], [place], [place] [address]

## 2019-04-19 ENCOUNTER — Other Ambulatory Visit: Payer: Self-pay

## 2019-04-19 ENCOUNTER — Telehealth (INDEPENDENT_AMBULATORY_CARE_PROVIDER_SITE_OTHER): Payer: Medicare Other | Admitting: Cardiovascular Disease

## 2019-04-19 ENCOUNTER — Other Ambulatory Visit: Payer: Self-pay | Admitting: Family

## 2019-04-19 DIAGNOSIS — I5032 Chronic diastolic (congestive) heart failure: Secondary | ICD-10-CM

## 2019-04-19 DIAGNOSIS — R7989 Other specified abnormal findings of blood chemistry: Secondary | ICD-10-CM

## 2019-04-19 DIAGNOSIS — I700 Atherosclerosis of aorta: Secondary | ICD-10-CM

## 2019-04-19 DIAGNOSIS — F419 Anxiety disorder, unspecified: Secondary | ICD-10-CM

## 2019-04-19 DIAGNOSIS — R5382 Chronic fatigue, unspecified: Secondary | ICD-10-CM | POA: Diagnosis not present

## 2019-04-19 DIAGNOSIS — I4891 Unspecified atrial fibrillation: Secondary | ICD-10-CM | POA: Diagnosis not present

## 2019-04-19 DIAGNOSIS — J438 Other emphysema: Secondary | ICD-10-CM

## 2019-04-19 DIAGNOSIS — R531 Weakness: Secondary | ICD-10-CM

## 2019-04-19 DIAGNOSIS — R0602 Shortness of breath: Secondary | ICD-10-CM

## 2019-04-19 MED ORDER — FUROSEMIDE 20 MG PO TABS: ORAL_TABLET | ORAL | 2 refills | Status: DC

## 2019-04-19 MED ORDER — POTASSIUM CHLORIDE ER 10 MEQ PO TBCR: EXTENDED_RELEASE_TABLET | ORAL | 2 refills | Status: DC

## 2019-04-19 NOTE — Patient Instructions (Addendum)
Medication Instructions:  Please increase the lasix 20 mg twice a day with potassium 10 meq twice a day for three days  Then take lasix 20 mg once daily with potassium 10 meq once daily  If you need a refill on your cardiac medications before your next appointment, please call your pharmacy.    Lab work: No new labs needed   If you have labs (blood work) drawn today and your tests are completely normal, you will receive your results only by: [name] MyChart Message (if you have MyChart) OR . A paper copy in the mail If you have any lab test that is abnormal or we need to change your treatment, we will call you to review the results.   Testing/Procedures: No new testing needed   Follow-Up: At [facility], you and your health needs are our priority.  As part of our continuing mission to provide you with exceptional heart care, we have created designated Provider Care Teams.  These Care Teams include your primary Cardiologist (physician) and Advanced Practice Providers (APPs -  Physician Assistants and Nurse Practitioners) who all work together to provide you with the care you need, when you need it.  . You will need a follow up appointment in 8 days- Monday 04/26/19 @ 10:20 am with Dr. Rockey Situ (Telephone visit)  . Providers on your designated Care Team:   . [name], NP . Christell Faith, PA-C . Marrianne Mood, PA-C  Any Other Special Instructions Will Be Listed Below (If Applicable).  For educational health videos Log in to : [email or web address] Or : [email or web address], password : [email or web address]

## 2019-04-20 DIAGNOSIS — G629 Polyneuropathy, unspecified: Secondary | ICD-10-CM | POA: Diagnosis not present

## 2019-04-20 DIAGNOSIS — I5032 Chronic diastolic (congestive) heart failure: Secondary | ICD-10-CM | POA: Diagnosis not present

## 2019-04-20 DIAGNOSIS — K2270 Barrett's esophagus without dysplasia: Secondary | ICD-10-CM | POA: Diagnosis not present

## 2019-04-20 DIAGNOSIS — Z9089 Acquired absence of other organs: Secondary | ICD-10-CM | POA: Diagnosis not present

## 2019-04-20 DIAGNOSIS — M069 Rheumatoid arthritis, unspecified: Secondary | ICD-10-CM | POA: Diagnosis not present

## 2019-04-20 DIAGNOSIS — Z87891 Personal history of nicotine dependence: Secondary | ICD-10-CM | POA: Diagnosis not present

## 2019-04-20 DIAGNOSIS — E559 Vitamin D deficiency, unspecified: Secondary | ICD-10-CM | POA: Diagnosis not present

## 2019-04-20 DIAGNOSIS — M1711 Unilateral primary osteoarthritis, right knee: Secondary | ICD-10-CM | POA: Diagnosis not present

## 2019-04-20 DIAGNOSIS — K219 Gastro-esophageal reflux disease without esophagitis: Secondary | ICD-10-CM | POA: Diagnosis not present

## 2019-04-20 DIAGNOSIS — I110 Hypertensive heart disease with heart failure: Secondary | ICD-10-CM | POA: Diagnosis not present

## 2019-04-20 DIAGNOSIS — Z7951 Long term (current) use of inhaled steroids: Secondary | ICD-10-CM | POA: Diagnosis not present

## 2019-04-20 DIAGNOSIS — Z85038 Personal history of other malignant neoplasm of large intestine: Secondary | ICD-10-CM | POA: Diagnosis not present

## 2019-04-20 DIAGNOSIS — J449 Chronic obstructive pulmonary disease, unspecified: Secondary | ICD-10-CM | POA: Diagnosis not present

## 2019-04-20 DIAGNOSIS — I482 Chronic atrial fibrillation, unspecified: Secondary | ICD-10-CM | POA: Diagnosis not present

## 2019-04-20 DIAGNOSIS — Z48815 Encounter for surgical aftercare following surgery on the digestive system: Secondary | ICD-10-CM | POA: Diagnosis not present

## 2019-04-20 DIAGNOSIS — Z9181 History of falling: Secondary | ICD-10-CM | POA: Diagnosis not present

## 2019-04-20 DIAGNOSIS — D649 Anemia, unspecified: Secondary | ICD-10-CM | POA: Diagnosis not present

## 2019-04-21 ENCOUNTER — Telehealth: Payer: Self-pay

## 2019-04-21 ENCOUNTER — Encounter: Payer: Self-pay | Admitting: Family

## 2019-04-21 ENCOUNTER — Ambulatory Visit (INDEPENDENT_AMBULATORY_CARE_PROVIDER_SITE_OTHER): Payer: Medicare Other | Admitting: Family

## 2019-04-21 DIAGNOSIS — I5032 Chronic diastolic (congestive) heart failure: Secondary | ICD-10-CM | POA: Diagnosis not present

## 2019-04-21 DIAGNOSIS — G629 Polyneuropathy, unspecified: Secondary | ICD-10-CM | POA: Diagnosis not present

## 2019-04-21 MED ORDER — GABAPENTIN 600 MG PO TABS: 600.0000 mg | ORAL_TABLET | Freq: Three times a day (TID) | ORAL | 1 refills | Status: DC

## 2019-04-21 NOTE — Telephone Encounter (Signed)
Copied from [place] [id number]. Topic: General - Call Back - No Documentation >> Apr 21, 2019  8:55 AM Reyne Dumas L wrote: Reason for CRM:   Pt states she is returning a call to [place] about labs. Pt can be reached at [phone]

## 2019-04-21 NOTE — Telephone Encounter (Signed)
Patient has follow-up appointment scheduled today at 2pm & labs scheduled for 6 weeks out.

## 2019-04-21 NOTE — Assessment & Plan Note (Signed)
Etiology of "zap" does not give at this time.  Considering neuropathy, or effects from lower extremity edema. As edema has been improving and weight down ( with clothes on even), patient will resume normal dosing of Lasix, potassium chloride.  She politely declines ultrasound bilateral legs today.  No strong suspicion for infection at this time however have counseled patient on staying vigilant in regards to this.  Will increase gabapentin with very close vigilance and patient let me know how she is doing

## 2019-04-21 NOTE — Patient Instructions (Addendum)
Elevate legs- very important. Toes above nose.   Please continue daily weights. Resume normal lasix 20 mg DAILY with potassium chloride 10 meq daily.   Please increase gabapentin to 600mg  three times per day  Let me know if swelling or daily weights do not continue to go down, please call me.   Please let me know how you are doing.

## 2019-04-21 NOTE — Assessment & Plan Note (Signed)
Weight is trending down, and patient does not exhibit worsening shortness of breath, orthopnea.  She is well-appearing the video today, not labored in her speech.  Patient will continue daily weight and let me know how she is doing

## 2019-04-21 NOTE — Progress Notes (Signed)
This visit type was conducted due to national recommendations for restrictions regarding the COVID-19 pandemic (e.g. social distancing).  This format is felt to be most appropriate for this patient at this time.  All issues noted in this document were discussed and addressed.  No physical exam was performed (except for noted visual exam findings with Video Visits). Virtual Visit via Video Note  I connected with@  on 04/21/19 at  2:00 PM EDT by a video enabled telemedicine application and verified that I am speaking with the correct person using two identifiers.  Location patient: home Location provider:work  Persons participating in the virtual visit: patient, provider  I discussed the limitations of evaluation and management by telemedicine and the availability of in person appointments. The patient expressed understanding and agreed to proceed.   HPI:  LE edema- x 10 days, overall  improvement. 'can see ankle bone today. ' Calf size are symmetric.   lasix QOD; increased  Lasix 20 mg twice a day for 3 days and then down to 20 mg daily. She has completed 3 day increase. hasnt weighed today and until during Doxy OV 207 with shoes on,clothes.   NO increased SOB, cough, orthopnea.   No h/o cellulitis.    Describes 'zap' on front of lower legs bilaterally,at shins. Lasts not even a 'second.'  Noticed past 2-3 days, unchanged. Notices more when takes a deep breath. No falls, injury. Front of shins are 'sore to the touch.' No pain with walking.  On gabapentin  600mg  qam and 600mg  qhs with some relief. Not too sedated on this regimen  Compression stockings are too hard to wear and hard to wrap ace wrap. Not elevating legs. Sitting on sofa.   H/o neuropathy.   No rash, breaks in skin, increased warmth of legs, fever.   Abnormal TSH-scheduled   A fib- on eliquis.   Telemedicine 04/19/19 Dr Rockey Situ- weight 2 days ago 210.   ROS: See pertinent positives and negatives per HPI.  Past Medical  History:  Diagnosis Date  . Barretts esophagus   . Chest pain    a. 02/2014 Myoview: Ef 50%, no ischemia.  . Colon polyps   . COPD (chronic obstructive pulmonary disease) ([place])   . Depression with anxiety   . GERD (gastroesophageal reflux disease)   . Neuropathy   . Permanent atrial fibrillation    a. s/p ablation 01/2012 in [facility] by Dr. Boyd Kerbs;  b. On sotalol & Xarelto;  c. 02/2014 Echo: EF 50-55%, mild conc LVH, nl LA size/structure;  d. Recurrent afib 8/15 & 08/29/2014.  [name] Rectal fistula   . Rheumatoid arthritis ([place])    On methotrexate and orencia  . Urinary incontinence   . Vitamin D deficiency     Past Surgical History:  Procedure Laterality Date  . ABDOMINAL HYSTERECTOMY  1992  . CARDIAC ELECTROPHYSIOLOGY STUDY AND ABLATION  2013  . CHOLECYSTECTOMY  1987  . COLONOSCOPY N/A 05/08/2015   Procedure: COLONOSCOPY;  Surgeon: Manya Silvas, MD;  Location: [facility] ENDOSCOPY;  Service: Endoscopy;  Laterality: N/A;  . ESOPHAGOGASTRODUODENOSCOPY N/A 05/06/2015   Procedure: ESOPHAGOGASTRODUODENOSCOPY (EGD);  Surgeon: Lollie Sails, MD;  Location: [facility] ENDOSCOPY;  Service: Endoscopy;  Laterality: N/A;  . LAPAROSCOPIC APPENDECTOMY N/A 02/19/2019   Procedure: APPENDECTOMY LAPAROSCOPIC converted to open appendectomy;  Surgeon: Herbert Pun, MD;  Location: [facility] ORS;  Service: General;  Laterality: N/A;  . OOPHORECTOMY    . oophrectomy Bilateral 1992    Family History  Problem Relation Age of  Onset  . Depression Mother   . Pancreatic cancer Mother   . Colon cancer Father   . Breast cancer Sister 64  . Diabetes Brother   . Breast cancer Cousin   . Neuropathy Neg Hx     SOCIAL HX: former smoker.    Current Outpatient Medications:  .  albuterol (PROVENTIL HFA;VENTOLIN HFA) 108 (90 Base) MCG/ACT inhaler, Inhale 1-2 puffs into the lungs every 4 (four) hours as needed for wheezing or shortness of breath., Disp: 1 Inhaler, Rfl: 10 .  bismuth subsalicylate (PEPTO-BISMOL) 262  MG chewable tablet, Chew 2 tablets (524 mg total) by mouth 3 (three) times daily as needed for indigestion or diarrhea or loose stools., Disp: 30 tablet, Rfl: 0 .  clonazePAM (KLONOPIN) 0.5 MG tablet, Take 1/2 tablet daily if needed for anxiety., Disp: 15 tablet, Rfl: 0 .  ELIQUIS 5 MG TABS tablet, Take 1 tablet (5 mg total) by mouth 2 (two) times daily., Disp: 180 tablet, Rfl: 3 .  esomeprazole (NEXIUM) 20 MG capsule, Take 1 capsule (20 mg total) by mouth daily., Disp: 90 capsule, Rfl: 1 .  Fluticasone-Umeclidin-Vilant (TRELEGY ELLIPTA) 100-62.5-25 MCG/INH AEPB, Inhale 1 puff into the lungs daily., Disp: 60 each, Rfl: 2 .  furosemide (LASIX) 20 MG tablet, Take 1 tablet (20 mg) by mouth daily, you may take 1 extra tablet (20 mg) after lunch as needed for increased swelling, weight gain, shortness of breath, Disp: 90 tablet, Rfl: 2 .  HUMIRA PEN 40 MG/0.4ML PNKT, Inject 40 mg as directed. Every 2 weeks, Disp: , Rfl:  .  hydroxychloroquine (PLAQUENIL) 200 MG tablet, Take 200 mg by mouth 2 (two) times daily. , Disp: , Rfl:  .  ibuprofen (ADVIL,MOTRIN) 400 MG tablet, Take 400 mg by mouth every 4 (four) hours as needed. , Disp: , Rfl:  .  lidocaine (XYLOCAINE) 5 % ointment, Apply 1 application topically as needed., Disp: , Rfl:  .  nitrofurantoin, macrocrystal-monohydrate, (MACROBID) 100 MG capsule, Take 1 capsule (100 mg total) by mouth 2 (two) times daily. Take with food., Disp: 10 capsule, Rfl: 0 .  potassium chloride (K-DUR) 10 MEQ tablet, Take 1 tablet (10 meq) by mouth once daily, take 1 extra tablet (10 meq) after lunch when taking extra lasix, Disp: 90 tablet, Rfl: 2 .  propranolol ER (INDERAL LA) 80 MG 24 hr capsule, Take 1 capsule (80 mg total) by mouth daily., Disp: 90 capsule, Rfl: 3 .  rosuvastatin (CRESTOR) 10 MG tablet, Take 1 tablet (10 mg total) by mouth daily., Disp: 90 tablet, Rfl: 3 .  traMADol (ULTRAM) 50 MG tablet, Take 1 tablet (50 mg total) by mouth every 12 (twelve) hours as needed  for severe pain., Disp: 30 tablet, Rfl: 0 .  gabapentin (NEURONTIN) 600 MG tablet, Take 1 tablet (600 mg total) by mouth 3 (three) times daily., Disp: 90 tablet, Rfl: 1  EXAM:  VITALS per patient if applicable:  GENERAL: alert, oriented, appears well and in no acute distress  HEENT: atraumatic, conjunttiva clear, no obvious abnormalities on inspection of external nose and ears  NECK: normal movements of the head and neck  LUNGS: on inspection no signs of respiratory distress, breathing rate appears normal, no obvious gross SOB, gasping or wheezing  CV: no obvious cyanosis  MS: moves all visible extremities without noticeable abnormality  Lower extremities: Difficult to really assess over video however no obvious erythema or asymmetry appreciated when patient panned  To her legs to show me her bilateral legs.  Varicosities  were noted  PSYCH/NEURO: pleasant and cooperative, no obvious depression or anxiety, speech and thought processing grossly intact  ASSESSMENT AND PLAN:  Discussed the following assessment and plan:  Neuropathy - Plan: gabapentin (NEURONTIN) 600 MG tablet  Chronic diastolic CHF (congestive heart failure) ([place])  Problem List Items Addressed This Visit      Cardiovascular and Mediastinum   Chronic diastolic CHF (congestive heart failure) (HCC)    Weight is trending down, and patient does not exhibit worsening shortness of breath, orthopnea.  She is well-appearing the video today, not labored in her speech.  Patient will continue daily weight and let me know how she is doing        Nervous and Auditory   Neuropathy - Primary    Etiology of "zap" does not give at this time.  Considering neuropathy, or effects from lower extremity edema. As edema has been improving and weight down ( with clothes on even), patient will resume normal dosing of Lasix, potassium chloride.  She politely declines ultrasound bilateral legs today.  No strong suspicion for infection at  this time however have counseled patient on staying vigilant in regards to this.  Will increase gabapentin with very close vigilance and patient let me know how she is doing      Relevant Medications   gabapentin (NEURONTIN) 600 MG tablet        I discussed the assessment and treatment plan with the patient. The patient was provided an opportunity to ask questions and all were answered. The patient agreed with the plan and demonstrated an understanding of the instructions.   The patient was advised to call back or seek an in-person evaluation if the symptoms worsen or if the condition fails to improve as anticipated.   Brandy Paris, FNP

## 2019-04-22 ENCOUNTER — Other Ambulatory Visit: Payer: Self-pay | Admitting: Family

## 2019-04-22 DIAGNOSIS — Z9089 Acquired absence of other organs: Secondary | ICD-10-CM | POA: Diagnosis not present

## 2019-04-22 DIAGNOSIS — K219 Gastro-esophageal reflux disease without esophagitis: Secondary | ICD-10-CM | POA: Diagnosis not present

## 2019-04-22 DIAGNOSIS — J449 Chronic obstructive pulmonary disease, unspecified: Secondary | ICD-10-CM | POA: Diagnosis not present

## 2019-04-22 DIAGNOSIS — D649 Anemia, unspecified: Secondary | ICD-10-CM | POA: Diagnosis not present

## 2019-04-22 DIAGNOSIS — M1711 Unilateral primary osteoarthritis, right knee: Secondary | ICD-10-CM | POA: Diagnosis not present

## 2019-04-22 DIAGNOSIS — K2270 Barrett's esophagus without dysplasia: Secondary | ICD-10-CM | POA: Diagnosis not present

## 2019-04-22 DIAGNOSIS — I110 Hypertensive heart disease with heart failure: Secondary | ICD-10-CM | POA: Diagnosis not present

## 2019-04-22 DIAGNOSIS — G629 Polyneuropathy, unspecified: Secondary | ICD-10-CM

## 2019-04-22 DIAGNOSIS — Z9181 History of falling: Secondary | ICD-10-CM | POA: Diagnosis not present

## 2019-04-22 DIAGNOSIS — Z7951 Long term (current) use of inhaled steroids: Secondary | ICD-10-CM | POA: Diagnosis not present

## 2019-04-22 DIAGNOSIS — I5032 Chronic diastolic (congestive) heart failure: Secondary | ICD-10-CM | POA: Diagnosis not present

## 2019-04-22 DIAGNOSIS — M069 Rheumatoid arthritis, unspecified: Secondary | ICD-10-CM | POA: Diagnosis not present

## 2019-04-22 DIAGNOSIS — E559 Vitamin D deficiency, unspecified: Secondary | ICD-10-CM | POA: Diagnosis not present

## 2019-04-22 DIAGNOSIS — Z87891 Personal history of nicotine dependence: Secondary | ICD-10-CM | POA: Diagnosis not present

## 2019-04-22 DIAGNOSIS — Z85038 Personal history of other malignant neoplasm of large intestine: Secondary | ICD-10-CM | POA: Diagnosis not present

## 2019-04-22 DIAGNOSIS — Z48815 Encounter for surgical aftercare following surgery on the digestive system: Secondary | ICD-10-CM | POA: Diagnosis not present

## 2019-04-22 DIAGNOSIS — I482 Chronic atrial fibrillation, unspecified: Secondary | ICD-10-CM | POA: Diagnosis not present

## 2019-04-22 NOTE — Progress Notes (Signed)
Patient states her leg swelling is a little better and she has only taken 3 doses of medication, she thinks she needs a neurologist and wants a referral.  Gae [email or web address]

## 2019-04-23 ENCOUNTER — Telehealth: Payer: Self-pay | Admitting: Family

## 2019-04-23 NOTE — Telephone Encounter (Signed)
I called patient & she is taking potassium correctly. I then called [facility] to try to clarify prescription & the receptionist there did not know who to send a note to? I asked that she send it to [employer]. Her PT to possibly give [place] some direction.

## 2019-04-23 NOTE — Telephone Encounter (Signed)
noted 

## 2019-04-23 NOTE — Telephone Encounter (Signed)
Sent to PCP for approval for 90 day supply   Last refilled 44/22/2020 disp 90 with 1 refill

## 2019-04-23 NOTE — Telephone Encounter (Signed)
Call pt   We got a note from [facility] in regards to the potassium supplement.  This was prescribed by Dr. Rockey Situ, cardiologist.  Please call [facility] ensure they fax to him  Also, the potassium chloride prescription that they had faxed to me is INCORRECT.   See chart for correct prescription  Please also circle back with patient to ensure that she has had this medication.  This may just be some sort of error

## 2019-04-24 NOTE — Progress Notes (Signed)
Virtual Visit via Telephone Note   This visit type was conducted due to national recommendations for restrictions regarding the COVID-19 Pandemic (e.g. social distancing) in an effort to limit this patient's exposure and mitigate transmission in our community.  Due to her co-morbid illnesses, this patient is at least at moderate risk for complications without adequate follow up.  This format is felt to be most appropriate for this patient at this time.  The patient did not have access to video technology/had technical difficulties with video requiring transitioning to audio format only (telephone).  All issues noted in this document were discussed and addressed.  No physical exam could be performed with this format.  Please refer to the patient's chart for her  consent to telehealth for [facility].   I connected with  Brandy Armstrong on 04/24/19 by a video enabled telemedicine application and verified that I am speaking with the correct person using two identifiers. I discussed the limitations of evaluation and management by telemedicine. The patient expressed understanding and agreed to proceed.   Evaluation Performed:  Follow-up visit  Date:  04/24/2019   ID:  Brandy Armstrong 12-22-1946, MRN [id number]  Patient Location:  [address] [place] [address]   Provider location:   [facility], [place] office  PCP:  [name], [place]  Cardiologist:  [place], [place]  Chief Complaint:  SOB, leg swelling   History of Present Illness:    Brandy Armstrong is a 73 y.o. female who presents via audio/video conferencing for a telehealth visit today.   The patient does not symptoms concerning for COVID-19 infection (fever, chills, cough, or new SHORTNESS OF BREATH).   Patient has a past medical history of  COPD, smoked quit 2012  Depression neuropathy  paroxysmal Atrial fibrillation Normal EF >55% in 11/2016 at [facility] s/p ablation 2013 and 2016 and had  recurrence. No further bleeding on apixaban. Previously seen by cardiology at [facility], Last seen February 2018 Appendix in  Who presents for follow-up of her permanent atrial fibrillation  Tele visit 04/19/2019 Recommend she increase Lasix up to 20 twice daily for 3 days then down to 20 daily Lab work reviewed, recent normal BMP We will talk with her again next week Recommend she avoid excessive fluid intake Suspect goal weight will be close to 200  Some around the ankles , "just a little" Current weight 208 Taking lasix 20 mg daily   Lab work reviewed with her Anemia better, HBG 12.7 On iron infusion Normal creatinine  Was previously on metoprolol, changed to propranolol for tremor  Other past medical history reviewed  AF ablation by Dr. Isaias Sakai in early 2013  recurrence when sotalol was stopped.  repeat procedure on 02/21/15.  required DCCV on 03/21/15.  Recurrence of her atrial fibrillation and change to amiodarone gastrointestinal bleeding and xarelto was stopped and aspirin started.  She has since been put on apixaban DCCV 08/2016 for recurrent AF with restoration of sinus rhythm. recurrence of AF about a month later. amiodarone has been discontinued  Previous discussions concerning rate-control, increased dose of amiodarone, repeat catheter ablation, hybrid ablation, and pacemaker placement with AV node ablation.  Stress test 2015, low risk  CT ABD 2015 ABD athero   Prior CV studies:   The following studies were reviewed today:  Echocardiogram preserved ejection fraction, 10/2016 Results discussed with her, grossly normal with no estimation of right heart pressures   Past Medical  History:  Diagnosis Date  . Barretts esophagus   . Chest pain    a. 02/2014 Myoview: Ef 50%, no ischemia.  . Colon polyps   . COPD (chronic obstructive pulmonary disease) ([place])   . Depression with anxiety   . GERD (gastroesophageal reflux disease)   . Neuropathy   .  Permanent atrial fibrillation    a. s/p ablation 01/2012 in [facility] by Dr. Boyd Kerbs;  b. On sotalol & Xarelto;  c. 02/2014 Echo: EF 50-55%, mild conc LVH, nl LA size/structure;  d. Recurrent afib 8/15 & 08/29/2014.  [name] Rectal fistula   . Rheumatoid arthritis ([place])    On methotrexate and orencia  . Urinary incontinence   . Vitamin D deficiency    Past Surgical History:  Procedure Laterality Date  . ABDOMINAL HYSTERECTOMY  1992  . CARDIAC ELECTROPHYSIOLOGY STUDY AND ABLATION  2013  . CHOLECYSTECTOMY  1987  . COLONOSCOPY N/A 05/08/2015   Procedure: COLONOSCOPY;  Surgeon: Manya Silvas, MD;  Location: [facility] ENDOSCOPY;  Service: Endoscopy;  Laterality: N/A;  . ESOPHAGOGASTRODUODENOSCOPY N/A 05/06/2015   Procedure: ESOPHAGOGASTRODUODENOSCOPY (EGD);  Surgeon: Lollie Sails, MD;  Location: [facility] ENDOSCOPY;  Service: Endoscopy;  Laterality: N/A;  . LAPAROSCOPIC APPENDECTOMY N/A 02/19/2019   Procedure: APPENDECTOMY LAPAROSCOPIC converted to open appendectomy;  Surgeon: Herbert Pun, MD;  Location: [facility] ORS;  Service: General;  Laterality: N/A;  . OOPHORECTOMY    . oophrectomy Bilateral 1992     No outpatient medications have been marked as taking for the 04/26/19 encounter (Appointment) with Minna Merritts, MD.     Allergies:   Latex   Social History   Tobacco Use  . Smoking status: Former Smoker    Packs/day: 1.00    Years: 40.00    Pack years: 40.00    Last attempt to quit: 12/16/2011    Years since quitting: 7.3  . Smokeless tobacco: Never Used  Substance Use Topics  . Alcohol use: Not Currently    Alcohol/week: 0.0 standard drinks    Comment: Occasionally has a drink  . Drug use: No     Current Outpatient Medications on File Prior to Visit  Medication Sig Dispense Refill  . albuterol (PROVENTIL HFA;VENTOLIN HFA) 108 (90 Base) MCG/ACT inhaler Inhale 1-2 puffs into the lungs every 4 (four) hours as needed for wheezing or shortness of breath. 1 Inhaler 10  . bismuth  subsalicylate (PEPTO-BISMOL) 262 MG chewable tablet Chew 2 tablets (524 mg total) by mouth 3 (three) times daily as needed for indigestion or diarrhea or loose stools. 30 tablet 0  . clonazePAM (KLONOPIN) 0.5 MG tablet Take 1/2 tablet daily if needed for anxiety. 15 tablet 0  . ELIQUIS 5 MG TABS tablet Take 1 tablet (5 mg total) by mouth 2 (two) times daily. 180 tablet 3  . esomeprazole (NEXIUM) 20 MG capsule Take 1 capsule (20 mg total) by mouth daily. 90 capsule 1  . Fluticasone-Umeclidin-Vilant (TRELEGY ELLIPTA) 100-62.5-25 MCG/INH AEPB Inhale 1 puff into the lungs daily. 60 each 2  . furosemide (LASIX) 20 MG tablet Take 1 tablet (20 mg) by mouth daily, you may take 1 extra tablet (20 mg) after lunch as needed for increased swelling, weight gain, shortness of breath 90 tablet 2  . gabapentin (NEURONTIN) 600 MG tablet Take 1 tablet (600 mg total) by mouth 3 (three) times daily. 90 tablet 1  . HUMIRA PEN 40 MG/0.4ML PNKT Inject 40 mg as directed. Every 2 weeks    . hydroxychloroquine (PLAQUENIL) 200 MG  tablet Take 200 mg by mouth 2 (two) times daily.     [name] ibuprofen (ADVIL,MOTRIN) 400 MG tablet Take 400 mg by mouth every 4 (four) hours as needed.     . lidocaine (XYLOCAINE) 5 % ointment Apply 1 application topically as needed.    . nitrofurantoin, macrocrystal-monohydrate, (MACROBID) 100 MG capsule Take 1 capsule (100 mg total) by mouth 2 (two) times daily. Take with food. 10 capsule 0  . potassium chloride (K-DUR) 10 MEQ tablet Take 1 tablet (10 meq) by mouth once daily, take 1 extra tablet (10 meq) after lunch when taking extra lasix 90 tablet 2  . propranolol ER (INDERAL LA) 80 MG 24 hr capsule Take 1 capsule (80 mg total) by mouth daily. 90 capsule 3  . rosuvastatin (CRESTOR) 10 MG tablet Take 1 tablet (10 mg total) by mouth daily. 90 tablet 3  . traMADol (ULTRAM) 50 MG tablet Take 1 tablet (50 mg total) by mouth every 12 (twelve) hours as needed for severe pain. 30 tablet 0   No current  facility-administered medications on file prior to visit.      Family Hx: The patient's family history includes Breast cancer in her cousin; Breast cancer (age of onset: 2) in her sister; Colon cancer in her father; Depression in her mother; Diabetes in her brother; Pancreatic cancer in her mother. There is no history of Neuropathy.  ROS:   Please see the history of present illness.    Review of Systems  Constitutional: Negative.   Respiratory: Negative.   Cardiovascular: Positive for leg swelling.  Gastrointestinal: Negative.   Musculoskeletal: Negative.   Neurological: Negative.   Psychiatric/Behavioral: Negative.   All other systems reviewed and are negative.    Labs/Other Tests and Data Reviewed:    Recent Labs: 03/02/2019: Magnesium 1.9 04/15/2019: ALT 7; BUN 9; Creatinine, Ser 0.87; Hemoglobin 11.1; Platelets 205.0; Potassium 3.8; Sodium 139; TSH 4.70   Recent Lipid Panel Lab Results  Component Value Date/Time   CHOL 155 10/12/2018 10:40 AM   TRIG 3,560 (H) 02/19/2019 10:57 PM   HDL 38.90 (L) 10/12/2018 10:40 AM   CHOLHDL 4 10/12/2018 10:40 AM   LDLCALC 80 10/12/2018 10:40 AM    Wt Readings from Last 3 Encounters:  04/15/19 212 lb 9.6 oz (96.4 kg)  03/31/19 207 lb (93.9 kg)  03/08/19 204 lb 5.9 oz (92.7 kg)     Exam:    Vital Signs: Vital signs may also be detailed in the HPI There were no vitals taken for this visit.  Wt Readings from Last 3 Encounters:  04/15/19 212 lb 9.6 oz (96.4 kg)  03/31/19 207 lb (93.9 kg)  03/08/19 204 lb 5.9 oz (92.7 kg)   Temp Readings from Last 3 Encounters:  04/15/19 97.7 F (36.5 C) (Oral)  03/08/19 98 F (36.7 C) (Oral)  02/10/19 97.9 F (36.6 C)   BP Readings from Last 3 Encounters:  04/15/19 118/88  03/31/19 (!) 118/96  03/08/19 124/73   Pulse Readings from Last 3 Encounters:  04/15/19 (!) 101  03/31/19 (!) 117  03/08/19 (!) 101    120/70, Pulse 90 resp 16  Well nourished, well developed female in no acute  distress. Constitutional:  oriented to person, place, and time. No distress.    ASSESSMENT & PLAN:    Permanent atrial fibrillation ([place]) - Plan: EKG 12-Lead Off all antiarrhythmics Discussed atrial fibrillation and risk of diastolic heart failure She will moderate her fluid intake, weight has been stable Asymptomatic  Chronic diastolic  CHF Recommend she stay on Lasix 20 with potassium 10 for weight less than 210 pounds Lasix 20 twice daily with potassium twice daily for 210 or higher  Atherosclerosis of aorta (HCC) - Plan: EKG 12-Lead Cholesterol close to goal Recommended low carbohydrate diet Needs a walking program, weight loss  Other emphysema (HCC) Long smoking history, currently on inhalers Recommended weight loss, walking program Followed by Dr. Alva Garnet,  Breathing better after diuresis, will try to keep weight less than 210 pounds as above  GI bleed due to NSAIDs Denies any GI bleed on eliquis Avoid NSAIDs Stable  Tremors of nervous system Also reports having neuropathy  Depression, recurrent (HCC) Stable, recommended regular walking program  Shortness of breath COPD, obesity, deconditioning, likely chronic diastolic CHF in the setting of atrial fibrillation Breathing somewhat better with diuresis will try to keep weight less than 210 pounds with Lasix as detailed above  COVID-19 Education: The signs and symptoms of COVID-19 were discussed with the patient and how to seek care for testing (follow up with PCP or arrange E-visit).  The importance of social distancing was discussed today.  Patient Risk:   After full review of this patients clinical status, I feel that they are at least moderate risk at this time.  Time:   Today, I have spent 25 minutes with the patient with telehealth technology discussing the cardiac and medical problems/diagnoses detailed above   10 min spent reviewing the chart prior to patient visit today   Medication  Adjustments/Labs and Tests Ordered: Current medicines are reviewed at length with the patient today.  Concerns regarding medicines are outlined above.   Tests Ordered: No tests ordered   Medication Changes: No changes made   Disposition: Follow-up in 3 months   Signed, Ida Rogue, MD  04/24/2019 1:41 PM    [facility] [address] [place] [address], [place], [place] [address]

## 2019-04-26 ENCOUNTER — Encounter: Payer: Self-pay | Admitting: Family

## 2019-04-26 ENCOUNTER — Encounter: Payer: Self-pay | Admitting: Cardiovascular Disease

## 2019-04-26 ENCOUNTER — Other Ambulatory Visit: Payer: Self-pay

## 2019-04-26 ENCOUNTER — Telehealth (INDEPENDENT_AMBULATORY_CARE_PROVIDER_SITE_OTHER): Payer: Medicare Other | Admitting: Cardiovascular Disease

## 2019-04-26 ENCOUNTER — Ambulatory Visit (INDEPENDENT_AMBULATORY_CARE_PROVIDER_SITE_OTHER): Payer: Medicare Other | Admitting: Family

## 2019-04-26 VITALS — BP 117/89 | HR 106 | Ht 66.0 in | Wt 208.0 lb

## 2019-04-26 DIAGNOSIS — I700 Atherosclerosis of aorta: Secondary | ICD-10-CM | POA: Diagnosis not present

## 2019-04-26 DIAGNOSIS — R5382 Chronic fatigue, unspecified: Secondary | ICD-10-CM

## 2019-04-26 DIAGNOSIS — I739 Peripheral vascular disease, unspecified: Secondary | ICD-10-CM

## 2019-04-26 DIAGNOSIS — R0602 Shortness of breath: Secondary | ICD-10-CM

## 2019-04-26 DIAGNOSIS — I4891 Unspecified atrial fibrillation: Secondary | ICD-10-CM

## 2019-04-26 DIAGNOSIS — G629 Polyneuropathy, unspecified: Secondary | ICD-10-CM | POA: Diagnosis not present

## 2019-04-26 DIAGNOSIS — I5032 Chronic diastolic (congestive) heart failure: Secondary | ICD-10-CM

## 2019-04-26 DIAGNOSIS — J438 Other emphysema: Secondary | ICD-10-CM

## 2019-04-26 DIAGNOSIS — F419 Anxiety disorder, unspecified: Secondary | ICD-10-CM | POA: Diagnosis not present

## 2019-04-26 DIAGNOSIS — R531 Weakness: Secondary | ICD-10-CM

## 2019-04-26 NOTE — Patient Instructions (Addendum)
Increase gabapentin :   Take 600mg  in the  Morning and 600 middday and then 900 mg at bedtime.   Today we discussed referrals, orders. MRI , neurology   I have placed these orders in the system for you.  Please be sure to give [place] a call if you have not heard from our office regarding this. We should hear from [place] within ONE week with information regarding your appointment. If not, please let me know immediately.   Let me know how you are doing.

## 2019-04-26 NOTE — Progress Notes (Signed)
Patient has appointment 04/26/19 for f/u.

## 2019-04-26 NOTE — Patient Instructions (Addendum)
Medication Instructions:  Your physician has recommended you make the following change in your medication:  1. For weight greater than 210 pounds take Furosemide 20 mg twice a day with Potassium 10 mEq twice a day 2. For weight less than 210 pounds take Furosemide 20 mg once a day with Potassium 10 mEq once a day  If you need a refill on your cardiac medications before your next appointment, please call your pharmacy.    Lab work: No new labs needed   If you have labs (blood work) drawn today and your tests are completely normal, you will receive your results only by: [name] MyChart Message (if you have MyChart) OR . A paper copy in the mail If you have any lab test that is abnormal or we need to change your treatment, we will call you to review the results.   Testing/Procedures: No new testing needed   Follow-Up: At [facility], you and your health needs are our priority.  As part of our continuing mission to provide you with exceptional heart care, we have created designated Provider Care Teams.  These Care Teams include your primary Cardiologist (physician) and Advanced Practice Providers (APPs -  Physician Assistants and Nurse Practitioners) who all work together to provide you with the care you need, when you need it.  . You will need a follow up appointment in 3 month .   Please call our office 2 months in advance to schedule this appointment.    . Providers on your designated Care Team:   . [name], NP . Christell Faith, PA-C . Marrianne Mood, PA-C  Any Other Special Instructions Will Be Listed Below (If Applicable).  For educational health videos Log in to : [email or web address] Or : [email or web address], password : [email or web address]

## 2019-04-26 NOTE — Progress Notes (Signed)
This visit type was conducted due to national recommendations for restrictions regarding the COVID-19 pandemic (e.g. social distancing).  This format is felt to be most appropriate for this patient at this time.  All issues noted in this document were discussed and addressed.  No physical exam was performed (except for noted visual exam findings with Video Visits). Virtual Visit via Video Note  I connected with@  on 04/26/19 at  1:30 PM EDT by a video enabled telemedicine application and verified that I am speaking with the correct person using two identifiers.  Location patient: home Location provider:work  Persons participating in the virtual visit: patient, provider  I discussed the limitations of evaluation and management by telemedicine and the availability of in person appointments. The patient expressed understanding and agreed to proceed. Interactive audio and video telecommunications were attempted between this provider and patient, however failed, due to patient having technical difficulties or patient did not have access to video capability.  We continued and completed visit with audio only.    HPI:  Follow up from 5 days ago in regards to 'zap' in shins. Continues to have Describes 'zap' BLE when yawns or when breaths in.  Increased gabapentin to 600mg  TID, with some relief. Has also had to take an additional 300mg  tablet in addition to TID. Not falls. Not feeling  Interested in [place]. Hasnt tramadol recently.   Would like to see neurology.   Continues to chronic low back pain.   CHF- LE swelling has improved, hasnt completely resolved. Legs are symmetric. No increased warmth, erythema. Skin is intact.  had follow up with Dr Rockey Situ today, virtually. ( note is not closed) ; given instructions on taking Lasix with weight metrics.   ROS: See pertinent positives and negatives per HPI.  Past Medical History:  Diagnosis Date  . Barretts esophagus   . Chest pain    a. 02/2014  Myoview: Ef 50%, no ischemia.  . Colon polyps   . COPD (chronic obstructive pulmonary disease) ([place])   . Depression with anxiety   . GERD (gastroesophageal reflux disease)   . Neuropathy   . Permanent atrial fibrillation    a. s/p ablation 01/2012 [facility] by Dr. Boyd Kerbs;  b. On sotalol & Xarelto;  c. 02/2014 Echo: EF 50-55%, mild conc LVH, nl LA size/structure;  d. Recurrent afib 8/15 & 08/29/2014.  [name] Rectal fistula   . Rheumatoid arthritis ([place])    On methotrexate and orencia  . Urinary incontinence   . Vitamin D deficiency     Past Surgical History:  Procedure Laterality Date  . ABDOMINAL HYSTERECTOMY  1992  . CARDIAC ELECTROPHYSIOLOGY STUDY AND ABLATION  2013  . CHOLECYSTECTOMY  1987  . COLONOSCOPY N/A 05/08/2015   Procedure: COLONOSCOPY;  Surgeon: Manya Silvas, MD;  Location: [facility] ENDOSCOPY;  Service: Endoscopy;  Laterality: N/A;  . ESOPHAGOGASTRODUODENOSCOPY N/A 05/06/2015   Procedure: ESOPHAGOGASTRODUODENOSCOPY (EGD);  Surgeon: Lollie Sails, MD;  Location: [facility] ENDOSCOPY;  Service: Endoscopy;  Laterality: N/A;  . LAPAROSCOPIC APPENDECTOMY N/A 02/19/2019   Procedure: APPENDECTOMY LAPAROSCOPIC converted to open appendectomy;  Surgeon: Herbert Pun, MD;  Location: [facility] ORS;  Service: General;  Laterality: N/A;  . OOPHORECTOMY    . oophrectomy Bilateral 1992    Family History  Problem Relation Age of Onset  . Depression Mother   . Pancreatic cancer Mother   . Colon cancer Father   . Breast cancer Sister 74  . Diabetes Brother   . Breast cancer Cousin   .  Neuropathy Neg Hx     SOCIAL HX: former smoker   Current Outpatient Medications:  .  albuterol (PROVENTIL HFA;VENTOLIN HFA) 108 (90 Base) MCG/ACT inhaler, Inhale 1-2 puffs into the lungs every 4 (four) hours as needed for wheezing or shortness of breath., Disp: 1 Inhaler, Rfl: 10 .  bismuth subsalicylate (PEPTO-BISMOL) 262 MG chewable tablet, Chew 2 tablets (524 mg total) by mouth 3 (three) times daily  as needed for indigestion or diarrhea or loose stools., Disp: 30 tablet, Rfl: 0 .  clonazePAM (KLONOPIN) 0.5 MG tablet, Take 1/2 tablet daily if needed for anxiety., Disp: 15 tablet, Rfl: 0 .  ELIQUIS 5 MG TABS tablet, Take 1 tablet (5 mg total) by mouth 2 (two) times daily., Disp: 180 tablet, Rfl: 3 .  esomeprazole (NEXIUM) 20 MG capsule, Take 1 capsule (20 mg total) by mouth daily., Disp: 90 capsule, Rfl: 1 .  Fluticasone-Umeclidin-Vilant (TRELEGY ELLIPTA) 100-62.5-25 MCG/INH AEPB, Inhale 1 puff into the lungs daily., Disp: 60 each, Rfl: 2 .  furosemide (LASIX) 20 MG tablet, Take 1 tablet (20 mg) by mouth daily, you may take 1 extra tablet (20 mg) after lunch as needed for increased swelling, weight gain, shortness of breath, Disp: 90 tablet, Rfl: 2 .  gabapentin (NEURONTIN) 600 MG tablet, TAKE 1 TABLET BY MOUTH THREE TIMES A DAY, Disp: 270 tablet, Rfl: 1 .  HUMIRA PEN 40 MG/0.4ML PNKT, Inject 40 mg as directed. Every 2 weeks, Disp: , Rfl:  .  hydroxychloroquine (PLAQUENIL) 200 MG tablet, Take 200 mg by mouth 2 (two) times daily. , Disp: , Rfl:  .  ibuprofen (ADVIL,MOTRIN) 400 MG tablet, Take 400 mg by mouth every 4 (four) hours as needed. , Disp: , Rfl:  .  lidocaine (XYLOCAINE) 5 % ointment, Apply 1 application topically as needed., Disp: , Rfl:  .  nitrofurantoin, macrocrystal-monohydrate, (MACROBID) 100 MG capsule, Take 1 capsule (100 mg total) by mouth 2 (two) times daily. Take with food., Disp: 10 capsule, Rfl: 0 .  potassium chloride (K-DUR) 10 MEQ tablet, Take 1 tablet (10 meq) by mouth once daily, take 1 extra tablet (10 meq) after lunch when taking extra lasix, Disp: 90 tablet, Rfl: 2 .  propranolol ER (INDERAL LA) 80 MG 24 hr capsule, Take 1 capsule (80 mg total) by mouth daily., Disp: 90 capsule, Rfl: 3 .  rosuvastatin (CRESTOR) 10 MG tablet, Take 1 tablet (10 mg total) by mouth daily., Disp: 90 tablet, Rfl: 3 .  traMADol (ULTRAM) 50 MG tablet, Take 1 tablet (50 mg total) by mouth every  12 (twelve) hours as needed for severe pain., Disp: 30 tablet, Rfl: 0  t  ASSESSMENT AND PLAN:  Discussed the following assessment and plan:  Neuropathy - Plan: Ambulatory referral to Neurology, B12 and Folate Panel, MR Lumbar Spine Wo Contrast, MR SACRUM SI JOINTS WO CONTRAST  Chronic diastolic CHF (congestive heart failure) ([place])  Problem List Items Addressed This Visit      Cardiovascular and Mediastinum   Chronic diastolic CHF (congestive heart failure) (H[facility])    BLE edema improved. Following with cardiology. Will follow.         Nervous and Auditory   Neuropathy - Primary    Chronic, some improvement with increase gabapentin.  Will continue gabapentin for now.  Discussed lycria and may use if gabapentin ineffective. Pending  Neurology consult, MRI lumbar and sacrum.  Patient will let me how she is doing      Relevant Orders   Ambulatory referral to Neurology  B12 and Folate Panel   MR Lumbar Spine Wo Contrast   MR SACRUM SI JOINTS WO CONTRAST        I discussed the assessment and treatment plan with the patient. The patient was provided an opportunity to ask questions and all were answered. The patient agreed with the plan and demonstrated an understanding of the instructions.   The patient was advised to call back or seek an in-person evaluation if the symptoms worsen or if the condition fails to improve as anticipated.   Mable Paris, FNP   I spent 15 min non face to face w/ pt.

## 2019-04-26 NOTE — Assessment & Plan Note (Signed)
BLE edema improved. Following with cardiology. Will follow.

## 2019-04-26 NOTE — Assessment & Plan Note (Signed)
Chronic, some improvement with increase gabapentin.  Will continue gabapentin for now.  Discussed lycria and may use if gabapentin ineffective. Pending  Neurology consult, MRI lumbar and sacrum.  Patient will let me how she is doing

## 2019-04-30 ENCOUNTER — Other Ambulatory Visit: Payer: Self-pay | Admitting: *Deleted

## 2019-04-30 MED ORDER — PROPRANOLOL HCL ER 80 MG PO CP24: 80.0000 mg | ORAL_CAPSULE | Freq: Every day | ORAL | 3 refills | Status: DC

## 2019-05-07 ENCOUNTER — Telehealth: Payer: Self-pay

## 2019-05-07 NOTE — Telephone Encounter (Signed)
Copied from [place] [id number]. Topic: General - Inquiry >> May 07, 2019  9:53 AM Rainey Pines A wrote: Reason for CRM: Patient would like a callback from scheduler. She is wanting to reschedule her lab appointment on 05/25/2019 for a sooner date if possible.   Called and cancelled and rescheduled her lab appt and she also scheduled a appt with the provider to discuss the labs afterwards.  Analuisa Tudor,cma

## 2019-05-10 ENCOUNTER — Inpatient Hospital Stay: Payer: Medicare Other

## 2019-05-10 DIAGNOSIS — R200 Anesthesia of skin: Secondary | ICD-10-CM | POA: Insufficient documentation

## 2019-05-10 DIAGNOSIS — R202 Paresthesia of skin: Secondary | ICD-10-CM | POA: Diagnosis not present

## 2019-05-12 ENCOUNTER — Inpatient Hospital Stay: Payer: Medicare Other | Admitting: Oncology

## 2019-05-12 ENCOUNTER — Other Ambulatory Visit: Payer: Self-pay

## 2019-05-12 ENCOUNTER — Other Ambulatory Visit (INDEPENDENT_AMBULATORY_CARE_PROVIDER_SITE_OTHER): Payer: Medicare Other

## 2019-05-12 ENCOUNTER — Ambulatory Visit: Payer: Medicare Other

## 2019-05-12 ENCOUNTER — Other Ambulatory Visit: Payer: Self-pay | Admitting: Family

## 2019-05-12 DIAGNOSIS — R7989 Other specified abnormal findings of blood chemistry: Secondary | ICD-10-CM | POA: Diagnosis not present

## 2019-05-12 DIAGNOSIS — G629 Polyneuropathy, unspecified: Secondary | ICD-10-CM | POA: Diagnosis not present

## 2019-05-12 LAB — T3, FREE: T3, Free: 3.8 pg/mL (ref 2.3–4.2)

## 2019-05-12 LAB — T4, FREE: Free T4: 1.12 ng/dL (ref 0.60–1.60)

## 2019-05-12 LAB — B12 AND FOLATE PANEL
Folate: 7.9 ng/mL (ref >5.9)
Vitamin B-12: 261 pg/mL (ref 211–911)

## 2019-05-12 LAB — TSH: TSH: 3.25 u[IU]/mL (ref 0.35–4.50)

## 2019-05-12 NOTE — Progress Notes (Signed)
close

## 2019-05-13 ENCOUNTER — Telehealth: Payer: Self-pay

## 2019-05-13 NOTE — Telephone Encounter (Signed)
Copied from [place] [id number]. Topic: Appointment Scheduling - Scheduling Inquiry for Clinic >> May 12, 2019  4:11 PM Valla Leaver wrote: Reason for CRM: Patient wants to cancel appt on 04/14/19 because her labs came back fine.

## 2019-05-14 ENCOUNTER — Telehealth: Payer: Self-pay

## 2019-05-14 ENCOUNTER — Ambulatory Visit: Payer: Medicare Other | Admitting: Family

## 2019-05-14 MED ORDER — CYANOCOBALAMIN 1000 MCG/ML IJ SOLN: INTRAMUSCULAR | 15 refills | Status: DC

## 2019-05-14 NOTE — Telephone Encounter (Signed)
Call pt I decided in lieu of pandemic, she is better to do this at home  I sent rx for b12 injections to pharmacy  Please educate patient in regard to taking weekly for ONE month and then ONCE per month as rx states  If you need to do a doxy nurse visit to explain injections that is fine OR if patient would like to come in for first injection for nurse visit, as long as she is appropriately screened, that is fine too  Let me know what you work out with her

## 2019-05-14 NOTE — Telephone Encounter (Signed)
See prior telephone note in regard to this

## 2019-05-14 NOTE — Telephone Encounter (Signed)
-----   Message from [name], [place] sent at 05/12/2019  3:51 PM EDT ----- CALL-  Ensure she has seen mychart Does she want to trial B12 either OTC or injections here?   Let me know if cannot reach patient.

## 2019-05-14 NOTE — Telephone Encounter (Signed)
Left message for pt to call back for explanation of B12 injections.  Makenize Messman,cma

## 2019-05-17 NOTE — Telephone Encounter (Signed)
lmtcb about B12 injections.  Gae Bon, cma

## 2019-05-20 NOTE — Telephone Encounter (Signed)
Called pt and she wants [place] to give her the injections she states she is not comfortable giving herself the injection, she will go to pharmacy and pick it up and we will give her the injections, she scheduled a nurse visit.  Garret Teale,cma

## 2019-05-21 DIAGNOSIS — M7062 Trochanteric bursitis, left hip: Secondary | ICD-10-CM | POA: Insufficient documentation

## 2019-05-21 DIAGNOSIS — M059 Rheumatoid arthritis with rheumatoid factor, unspecified: Secondary | ICD-10-CM | POA: Diagnosis not present

## 2019-05-21 NOTE — Telephone Encounter (Signed)
noted 

## 2019-05-24 ENCOUNTER — Other Ambulatory Visit: Payer: Self-pay | Admitting: Pulmonary Disease

## 2019-05-25 ENCOUNTER — Other Ambulatory Visit: Payer: Medicare Other

## 2019-05-26 ENCOUNTER — Ambulatory Visit: Payer: Medicare Other

## 2019-05-26 ENCOUNTER — Telehealth: Payer: Self-pay | Admitting: Cardiovascular Disease

## 2019-05-26 NOTE — Telephone Encounter (Signed)
Pt c/o BP issue: STAT if pt c/o blurred vision, one-sided weakness or slurred speech  1. What are your last 5 BP readings?  Sun night 152/107 Mon morning 166/135 Mon night 158/123 Tues morning 162/139 Wed morning 157/130  2. Are you having any other symptoms (ex. Dizziness, headache, blurred vision, passed out)? No   3. What is your BP issue? Blood pressure is high, got out of the hospital recently.

## 2019-05-26 NOTE — Telephone Encounter (Signed)
Spoke with the pt. Pt sts that her BP and been elevated for several days. Pt is asymptomatic. She denies palpitations, som, swelling, weigh gain. She has provide the last 5 readings in the message below. She did not make note of her HR. I asked her to ck her BP and HR while I held the line. Pt reports 132/104 95bpm.  She took her propanolol around 10-11am. She does not take lasix daily. She is on a sliding scale Per Dr.Gollan 04/26/19 telemedicine visit  Chronic diastolic CHF Recommend she stay on Lasix 20 with potassium 10 for weight less than 210 pounds Lasix 20 twice daily with potassium twice daily for 210 or higher  Pt did not weigh this morning, her weight yesterday 206lbs. Pt sts that she has not been taking lasix daily. She only takes it when she has LE edema. She has taken lasix 2 days on a row over the last 7 days. Pt sts that she notice no change in her BP on the days she take lasix. Adv the pt on the importance of daily weights and medication compliance.  Adv the pt that Dr. Rockey Situ is out of the office. I will fwd the message to our DOD for further advice. Pt verbalized understanding.

## 2019-05-27 ENCOUNTER — Other Ambulatory Visit
Admission: RE | Admit: 2019-05-27 | Discharge: 2019-05-27 | Disposition: A | Discharge status: Home / Self Care | Payer: Medicare Other | Source: Ambulatory Visit | Attending: Physician Assistant | Admitting: Physician Assistant

## 2019-05-27 ENCOUNTER — Encounter: Payer: Self-pay | Admitting: Physician Assistant

## 2019-05-27 ENCOUNTER — Other Ambulatory Visit: Payer: Self-pay

## 2019-05-27 ENCOUNTER — Ambulatory Visit (INDEPENDENT_AMBULATORY_CARE_PROVIDER_SITE_OTHER): Payer: Medicare Other | Admitting: Physician Assistant

## 2019-05-27 ENCOUNTER — Telehealth: Payer: Self-pay | Admitting: Physician Assistant

## 2019-05-27 VITALS — BP 148/122 | HR 93 | Ht 66.5 in | Wt 209.0 lb

## 2019-05-27 DIAGNOSIS — I10 Essential (primary) hypertension: Secondary | ICD-10-CM

## 2019-05-27 DIAGNOSIS — I5032 Chronic diastolic (congestive) heart failure: Secondary | ICD-10-CM | POA: Diagnosis not present

## 2019-05-27 DIAGNOSIS — K922 Gastrointestinal hemorrhage, unspecified: Secondary | ICD-10-CM

## 2019-05-27 DIAGNOSIS — I4821 Permanent atrial fibrillation: Secondary | ICD-10-CM | POA: Insufficient documentation

## 2019-05-27 DIAGNOSIS — F419 Anxiety disorder, unspecified: Secondary | ICD-10-CM

## 2019-05-27 DIAGNOSIS — J438 Other emphysema: Secondary | ICD-10-CM

## 2019-05-27 DIAGNOSIS — T39395A Adverse effect of other nonsteroidal anti-inflammatory drugs [NSAID], initial encounter: Secondary | ICD-10-CM

## 2019-05-27 LAB — BASIC METABOLIC PANEL
Anion gap: 7 (ref 5–15)
BUN: 15 mg/dL (ref 8–23)
CO2: 27 mmol/L (ref 22–32)
Calcium: 8.8 mg/dL — ABNORMAL LOW (ref 8.9–10.3)
Chloride: 105 mmol/L (ref 98–111)
Creatinine, Ser: 0.75 mg/dL (ref 0.44–1.00)
GFR calc Af Amer: >60 mL/min (ref >60)
GFR calc non Af Amer: >60 mL/min (ref >60)
Glucose, Bld: 98 mg/dL (ref 70–99)
Potassium: 3.2 mmol/L — ABNORMAL LOW (ref 3.5–5.1)
Sodium: 139 mmol/L (ref 135–145)

## 2019-05-27 MED ORDER — PROPRANOLOL HCL ER 120 MG PO CP24: 120.0000 mg | ORAL_CAPSULE | Freq: Every day | ORAL | 3 refills | Status: DC

## 2019-05-27 NOTE — Telephone Encounter (Signed)
Notes recorded by Rise Mu, PA-C on 05/27/2019 at 12:09 PM EDT Labs show a stable renal function. No changes to Lasix, continue 20 mg daily. Her potassium is low at 3.2. Goal is 4.0. I would like for her to take KCl 40 mEq x 1 followed by continuation of 10 mEq daily thereafter when she is taking her Lasix. She did mention skipping doses of Lasix at times. If she does that, she should also skip her KCl.

## 2019-05-27 NOTE — Telephone Encounter (Signed)
Called patient and gave her Dr Darnelle Bos recommendations. She said she compared her cuff with her neighbors cuff and got the same high readings.  Today it was still 156/136. Patient states she is worried about this.  She does not feel this should be put off and is concerned with the high readings. I explained that we did not think the cuff may be accurate.  She is concerned. Advised that I will check with Brandy Faith, PA to see if she can be seen today or tomorrow and to bring her BP cuff to compare.  Advised I will call her back after I make sure this is ok.

## 2019-05-27 NOTE — Telephone Encounter (Signed)
Attempted to call the patient at her home & cell #'s. No answer at either #- I left a message to call back on both numbers.

## 2019-05-27 NOTE — Telephone Encounter (Signed)
Called patient and she is agreeable to come in today at 11 am. She verbalized understanding to bring her home BP cuff and the process of coming through the [place]. I encouraged her to ask the [place] staff for a wheelchair and to help her get down to our office. She is aware to plan for extra time for the process.

## 2019-05-27 NOTE — Patient Instructions (Signed)
Medication Instructions:  Your physician has recommended you make the following change in your medication:  1- INCREASE Propranolol to Take 1 capsule (120 mg total) by mouth daily.  If you need a refill on your cardiac medications before your next appointment, please call your pharmacy.   Lab work: Your physician recommends that you return for lab work today [facility]) at [employer] [employer]. No appt is needed. Hours are M-F 7AM- 6 PM.  If you have labs (blood work) drawn today and your tests are completely normal, you will receive your results only by: [name] MyChart Message (if you have MyChart) OR . A paper copy in the mail If you have any lab test that is abnormal or we need to change your treatment, we will call you to review the results.  Testing/Procedures: None ordered   Follow-Up: At [facility], you and your health needs are our priority.  As part of our continuing mission to provide you with exceptional heart care, we have created designated Provider Care Teams.  These Care Teams include your primary Cardiologist (physician) and Advanced Practice Providers (APPs -  Physician Assistants and Nurse Practitioners) who all work together to provide you with the care you need, when you need it. You will need a follow up appointment in 3 months.  Please see Ida Rogue, MD.  Any Other Special Instructions Will Be Listed Below (If Applicable). Please obtain BP cuff with script that has been provided to you.

## 2019-05-27 NOTE — Telephone Encounter (Signed)
BP's reviewed.  Systolic pressures mildly elevated.  Diastolic readings severely elevated; given very narrow pulse pressure, I question accuracy of her BP cuff.  She should confirm that her cuff is functioning properly.  I recommend continuing her current medications and limiting salt intake.  If BP remains consistently above 160/100, she should contact us again to discuss adding a medication.  Otherwise, she should f/u with Dr. Rockey Situ when he is back in the office.  Nelva Bush, MD [facility] Pager: [phone]

## 2019-05-27 NOTE — Telephone Encounter (Signed)
Patient calling back.  She verbalized understanding to take KCL 40 meq today and then take 10 mEq daily as usual daily. She is aware to only take potassium the days she takes her furosemide. She was appreciative.

## 2019-05-27 NOTE — Telephone Encounter (Signed)
OK to add either day.

## 2019-05-27 NOTE — Progress Notes (Signed)
Cardiology Office Note Date:  05/27/2019  Patient ID:  Brandy Armstrong, Brandy Armstrong 11-11-1946, MRN [id number] PCP:  [name], FNP  Cardiologist:  Dr. Rockey Situ, MD    Chief Complaint: Elevated blood pressure  History of Present Illness: Brandy Armstrong is a 73 y.o. female with history of permanent Afib on Eliquis s/p ablation in 2013 with recurrent Afib s/p repeat ablation in 01/2015 s/p DCCV in 02/2015 s/p DCCV in 08/2016 with recurrent Afib 1 month later leading to the discontinuation of amiodarone and rate control strategy since, aortic atherosclerosis, chronic diastolic CHF, GI bleed on Xarelto, COPD secondary to tobacco abuse quitting in 2012, HTN, anemia and anxiety.   She was recently admitted to the hospital from 2/21-3/9 for acute appendicitis complicated by perforation, Afib with RVR, respiratory failure requiring mechanical ventilation, and encephalopathy. She was seen by cardiology for assistance with rate control in her acute illness. Echo showed a hyperdynamic EF of > 65%, normal RVSF, no significant valvular abnormalities. Rates were difficult to control post-operatively leading to consideration of TEE/DCCV, though this was felt best to be left as a last resort given she has not previously held sinus rhythm following cardioversion.  She was ultimately recommended to continue rate control.  Since then, she has been seen 3 times in a tele-medicine follow-up.  She continues to deal with some underlying anxiety following her admission earlier this year.  She was seen in late 03/2019 noting some increased lower extremity swelling felt to be in the setting of acute on chronic diastolic heart failure secondary to poorly controlled ventricular rates.  It has been felt her dry weight is approximately 200 pounds with the patient weighing 212 pounds and telehealth follow-up on 4/16 at outside office.  She was advised to take Lasix 20 mg twice daily for 3 days then 20 mg daily for weight greater than 200  pounds.  Of note, there has previously been some concern that her lower extremity swelling has been related to third spacing from hypoalbuminemia.  She was seen most recently in telehealth follow-up by our office on 4/27 with a weight of 209 pounds.  Patient called our office on 5/27 noting her home blood pressure cuff was providing readings in the 578I to 696E systolic over low 952W to 413K diastolic.  There was some concern the patient's blood pressure cuff was inaccurate as she is using a wrist blood pressure cuff which is relatively new for her.  She compared this with her neighbor's wrist blood pressure cuff and got similar readings.  She was added to my schedule today for evaluation of her blood pressure readings.  She did bring in her wrist BP cuff and we obtained a blood pressure reading of 149/122 with her cuff.  Our manual BP cuff showed a blood pressure of 140/84.  Her weight remains stable at 209 pounds.  She does indicate frequent missed doses of Lasix.  She denies any chest pain, palpitations, shortness of breath, dizziness, presyncope, or syncope.  She indicates her lower extremity swelling is improving.  She remains anxious dating back to her hospitalization earlier this year.   Past Medical History:  Diagnosis Date   Barretts esophagus    Chest pain    a. 02/2014 Myoview: Ef 50%, no ischemia.   Colon polyps    COPD (chronic obstructive pulmonary disease) (HCC)    Depression with anxiety    GERD (gastroesophageal reflux disease)    Neuropathy    Permanent atrial fibrillation  a. s/p ablation 01/2012 in [facility] by Dr. Boyd Kerbs;  b. On sotalol & Xarelto;  c. 02/2014 Echo: EF 50-55%, mild conc LVH, nl LA size/structure;  d. Recurrent afib 8/15 & 08/29/2014.   Rectal fistula    Rheumatoid arthritis ([place])    On methotrexate and orencia   Urinary incontinence    Vitamin D deficiency     Past Surgical History:  Procedure Laterality Date   ABDOMINAL HYSTERECTOMY  1992     CARDIAC ELECTROPHYSIOLOGY STUDY AND ABLATION  2013   CHOLECYSTECTOMY  1987   COLONOSCOPY N/A 05/08/2015   Procedure: COLONOSCOPY;  Surgeon: Manya Silvas, MD;  Location: [place];  Service: Endoscopy;  Laterality: N/A;   ESOPHAGOGASTRODUODENOSCOPY N/A 05/06/2015   Procedure: ESOPHAGOGASTRODUODENOSCOPY (EGD);  Surgeon: Lollie Sails, MD;  Location: [facility] ENDOSCOPY;  Service: Endoscopy;  Laterality: N/A;   LAPAROSCOPIC APPENDECTOMY N/A 02/19/2019   Procedure: APPENDECTOMY LAPAROSCOPIC converted to open appendectomy;  Surgeon: Herbert Pun, MD;  Location: [facility] ORS;  Service: General;  Laterality: N/A;   OOPHORECTOMY     oophrectomy Bilateral 1992    Current Meds  Medication Sig   albuterol (PROVENTIL HFA;VENTOLIN HFA) 108 (90 Base) MCG/ACT inhaler Inhale 1-2 puffs into the lungs every 4 (four) hours as needed for wheezing or shortness of breath.   bismuth subsalicylate (PEPTO-BISMOL) 262 MG chewable tablet Chew 2 tablets (524 mg total) by mouth 3 (three) times daily as needed for indigestion or diarrhea or loose stools.   clonazePAM (KLONOPIN) 0.5 MG tablet Take 1/2 tablet daily if needed for anxiety.   cyanocobalamin (,VITAMIN B-12,) 1000 MCG/ML injection 1000 mcg (1 mg) injection once per week for four weeks, followed by 1000 mcg injection once per month.   ELIQUIS 5 MG TABS tablet Take 1 tablet (5 mg total) by mouth 2 (two) times daily.   esomeprazole (NEXIUM) 20 MG capsule Take 1 capsule (20 mg total) by mouth daily.   furosemide (LASIX) 20 MG tablet Take 1 tablet (20 mg) by mouth daily, you may take 1 extra tablet (20 mg) after lunch as needed for increased swelling, weight gain, shortness of breath   gabapentin (NEURONTIN) 600 MG tablet TAKE 1 TABLET BY MOUTH THREE TIMES A DAY   Gabapentin, Once-Daily, 300 MG TABS Take by mouth.   HUMIRA PEN 40 MG/0.4ML PNKT Inject 40 mg as directed. Every 2 weeks   hydroxychloroquine (PLAQUENIL) 200 MG tablet Take 200  mg by mouth 2 (two) times daily.    ibuprofen (ADVIL,MOTRIN) 400 MG tablet Take 400 mg by mouth every 4 (four) hours as needed.    lidocaine (XYLOCAINE) 5 % ointment Apply 1 application topically as needed.   nitrofurantoin, macrocrystal-monohydrate, (MACROBID) 100 MG capsule Take 1 capsule (100 mg total) by mouth 2 (two) times daily. Take with food.   potassium chloride (K-DUR) 10 MEQ tablet Take 1 tablet (10 meq) by mouth once daily, take 1 extra tablet (10 meq) after lunch when taking extra lasix   propranolol ER (INDERAL LA) 80 MG 24 hr capsule Take 1 capsule (80 mg total) by mouth daily.   rosuvastatin (CRESTOR) 10 MG tablet Take 1 tablet (10 mg total) by mouth daily.   traMADol (ULTRAM) 50 MG tablet Take 1 tablet (50 mg total) by mouth every 12 (twelve) hours as needed for severe pain.   TRELEGY ELLIPTA 100-62.5-25 MCG/INH AEPB INHALE 1 PUFF INTO THE LUNGS DAILY    Allergies:   Latex   Social History:  The patient  reports that she  quit smoking about 7 years ago. She has a 40.00 pack-year smoking history. She has never used smokeless tobacco. She reports previous alcohol use. She reports that she does not use drugs.   Family History:  The patient's family history includes Breast cancer in her cousin; Breast cancer (age of onset: 29) in her sister; Colon cancer in her father; Depression in her mother; Diabetes in her brother; Pancreatic cancer in her mother.  ROS:   Review of Systems  Constitutional: Positive for malaise/fatigue. Negative for chills, diaphoresis, fever and weight loss.  HENT: Negative for congestion.   Eyes: Negative for discharge and redness.  Respiratory: Negative for cough, hemoptysis, sputum production, shortness of breath and wheezing.   Cardiovascular: Positive for leg swelling. Negative for chest pain, palpitations, orthopnea, claudication and PND.  Gastrointestinal: Negative for abdominal pain, blood in stool, heartburn, melena, nausea and vomiting.    Genitourinary: Negative for hematuria.  Musculoskeletal: Negative for falls and myalgias.  Skin: Negative for rash.  Neurological: Positive for weakness. Negative for dizziness, tingling, tremors, sensory change, speech change, focal weakness and loss of consciousness.  Endo/Heme/Allergies: Does not bruise/bleed easily.  Psychiatric/Behavioral: Negative for substance abuse. The patient is nervous/anxious.   All other systems reviewed and are negative.    PHYSICAL EXAM:  VS:  BP 140/84 (BP Location: Left Arm, Patient Position: Sitting, Cuff Size: Normal)    Pulse 90    Ht 5' 6.5" (1.689 m)    Wt 209 lb (94.8 kg)    BMI 33.23 kg/m  BMI: Body mass index is 33.23 kg/m.  Physical Exam  Constitutional: She is oriented to person, place, and time. She appears well-developed and well-nourished.  HENT:  Head: Normocephalic and atraumatic.  Eyes: Right eye exhibits no discharge. Left eye exhibits no discharge.  Neck: Normal range of motion. No JVD present.  Cardiovascular: Normal rate, S1 normal, S2 normal and normal heart sounds. An irregularly irregular rhythm present. Exam reveals no distant heart sounds, no friction rub, no midsystolic click and no opening snap.  No murmur heard. Pulses:      Posterior tibial pulses are 2+ on the right side and 2+ on the left side.  Pulmonary/Chest: Effort normal and breath sounds normal. No respiratory distress. She has no decreased breath sounds. She has no wheezes. She has no rales. She exhibits no tenderness.  Abdominal: Soft. She exhibits no distension. There is no abdominal tenderness.  Musculoskeletal:        General: Edema present.     Comments: Trace bilateral pretibial edema with changes consistent with chronic venous insufficiency  Neurological: She is alert and oriented to person, place, and time.  Skin: Skin is warm and dry. No cyanosis. Nails show no clubbing.  Psychiatric: She has a normal mood and affect. Her speech is normal and behavior is  normal. Judgment and thought content normal.     EKG:  Was not ordered today.  Recent Labs: 03/02/2019: Magnesium 1.9 04/15/2019: ALT 7; BUN 9; Creatinine, Ser 0.87; Hemoglobin 11.1; Platelets 205.0; Potassium 3.8; Sodium 139 05/12/2019: TSH 3.25  10/12/2018: Cholesterol 155; HDL 38.90; LDL Cholesterol 80; Total CHOL/HDL Ratio 4; VLDL 35.8 02/19/2019: Triglycerides 3,560   CrCl cannot be calculated (Patient's most recent lab result is older than the maximum 21 days allowed.).   Wt Readings from Last 3 Encounters:  05/27/19 209 lb (94.8 kg)  04/26/19 208 lb (94.3 kg)  04/15/19 212 lb 9.6 oz (96.4 kg)     Other studies reviewed: Additional studies/records reviewed  today include: summarized above  ASSESSMENT AND PLAN:  1. Hypertension: Her home blood pressure readings appear to be erroneous in the setting of a wrist BP cuff.  This was brought into our office today for comparison and found to be inaccurate.  I have advised her to throw away her wrist BP cuff or take this back to the store and obtain a brachial BP cuff.  Nonetheless, her blood pressure is elevated today when compared to prior readings.  She does feel like some of this is in the setting of underlying anxiety.  I will increase her propranolol to 120 mg daily.  2. Permanent A. fib: Ventricular rates are reasonably controlled though not optimal.  Increase propanolol as above.  Continue Eliquis.  No recent concern for bleeding.  Recent CBC demonstrated stable hemoglobin.  3. HFpEF: She appears grossly euvolemic and well compensated.  She indicates difficulty in taking Lasix on a daily basis as she does not want to go to the restroom frequently.  I suspect some of her lower extremity swelling is in the setting of chronic venous insufficiency with underlying third spacing secondary to hypoalbuminemia as well as underlying mild anemia.  I recommend she elevate her legs when sitting, wear compression hose, and continue Lasix 20 mg daily  to every other day pending recheck of renal function today.  4. COPD: She reports this is stable.  Managed by pulmonology.  5. Anxiety: Per PCP.  6. History of GI bleed: Most recent hemoglobin stable.  Avoid NSAIDs.  Disposition: F/u with Dr. Rockey Situ or an APP in 6 months.  Current medicines are reviewed at length with the patient today.  The patient did not have any concerns regarding medicines.  Signed, Christell Faith, PA-C 05/27/2019 11:01 AM     [address] [place], [place] [address] [phone]

## 2019-06-02 ENCOUNTER — Ambulatory Visit: Payer: Medicare Other | Admitting: *Deleted

## 2019-06-02 ENCOUNTER — Other Ambulatory Visit: Payer: Self-pay

## 2019-06-02 DIAGNOSIS — E538 Deficiency of other specified B group vitamins: Secondary | ICD-10-CM

## 2019-06-02 MED ORDER — CYANOCOBALAMIN 1000 MCG/ML IJ SOLN: 1000.0000 ug | Freq: Once | INTRAMUSCULAR | Status: DC

## 2019-06-02 NOTE — Progress Notes (Signed)
Patient presented for B 12 injection to right deltoid, patient voiced no concerns nor showed any signs of distress during injection. 

## 2019-06-09 DIAGNOSIS — R202 Paresthesia of skin: Secondary | ICD-10-CM | POA: Diagnosis not present

## 2019-06-09 DIAGNOSIS — R200 Anesthesia of skin: Secondary | ICD-10-CM | POA: Diagnosis not present

## 2019-06-10 ENCOUNTER — Ambulatory Visit
Admission: RE | Admit: 2019-06-10 | Discharge: 2019-06-10 | Disposition: A | Discharge status: Home / Self Care | Payer: Medicare Other | Source: Ambulatory Visit | Attending: Family | Admitting: Family

## 2019-06-10 ENCOUNTER — Other Ambulatory Visit: Payer: Self-pay

## 2019-06-10 DIAGNOSIS — G629 Polyneuropathy, unspecified: Secondary | ICD-10-CM

## 2019-06-10 DIAGNOSIS — M5127 Other intervertebral disc displacement, lumbosacral region: Secondary | ICD-10-CM | POA: Diagnosis not present

## 2019-06-10 DIAGNOSIS — M47816 Spondylosis without myelopathy or radiculopathy, lumbar region: Secondary | ICD-10-CM | POA: Diagnosis not present

## 2019-06-10 DIAGNOSIS — K5730 Diverticulosis of large intestine without perforation or abscess without bleeding: Secondary | ICD-10-CM | POA: Diagnosis not present

## 2019-06-11 ENCOUNTER — Telehealth: Payer: Self-pay | Admitting: Family

## 2019-06-11 NOTE — Telephone Encounter (Signed)
Copied from [place] [id number]. Topic: Quick Communication - See Telephone Encounter >> Jun 11, 2019  4:06 PM Blase Mess A wrote: CRM for notification. See Telephone encounter for: 06/11/19. Paient is calling back for her MRI results CB- [phone] (M)

## 2019-06-14 ENCOUNTER — Other Ambulatory Visit (INDEPENDENT_AMBULATORY_CARE_PROVIDER_SITE_OTHER): Payer: Self-pay | Admitting: Vascular Surgery

## 2019-06-14 ENCOUNTER — Telehealth: Payer: Self-pay

## 2019-06-14 DIAGNOSIS — I739 Peripheral vascular disease, unspecified: Secondary | ICD-10-CM

## 2019-06-14 DIAGNOSIS — I714 Abdominal aortic aneurysm, without rupture, unspecified: Secondary | ICD-10-CM

## 2019-06-14 NOTE — Telephone Encounter (Signed)
Copied from [place] [id number]. Topic: Quick Communication - See Telephone Encounter >> Jun 11, 2019  4:06 PM Blase Mess A wrote: CRM for notification. See Telephone encounter for: 06/11/19. Paient is calling back for her MRI results CB- [phone] (M)  Left message for pt to call back for MRI results.  Sherley Leser,cma

## 2019-06-14 NOTE — Telephone Encounter (Unsigned)
Copied from [place] [id number]. Topic: Quick Communication - See Telephone Encounter >> Jun 11, 2019  4:06 PM Blase Mess A wrote: CRM for notification. See Telephone encounter for: 06/11/19. Paient is calling back for her MRI results CB- [phone] (M)

## 2019-06-16 NOTE — Telephone Encounter (Signed)
Patient did view results via mychart. She is scheduled for f/u next Wednesday 6/24.

## 2019-06-17 ENCOUNTER — Telehealth: Payer: Self-pay

## 2019-06-17 NOTE — Telephone Encounter (Signed)
Copied from [place] [id number]. Topic: Quick Communication - See Telephone Encounter >> Jun 11, 2019  4:06 PM Blase Mess A wrote: CRM for notification. See Telephone encounter for: 06/11/19. Paient is calling back for her MRI results CB- [phone] (M)

## 2019-06-17 NOTE — Telephone Encounter (Signed)
Patient is scheduled for f/u to discuss.

## 2019-06-21 ENCOUNTER — Telehealth: Payer: Self-pay | Admitting: Family

## 2019-06-21 ENCOUNTER — Encounter (INDEPENDENT_AMBULATORY_CARE_PROVIDER_SITE_OTHER): Payer: Medicare Other

## 2019-06-21 ENCOUNTER — Ambulatory Visit (INDEPENDENT_AMBULATORY_CARE_PROVIDER_SITE_OTHER): Payer: Medicare Other | Admitting: Vascular Surgery

## 2019-06-21 NOTE — Telephone Encounter (Signed)
Last OV:  04/02/19  Last Refill:  04/02/19

## 2019-06-21 NOTE — Telephone Encounter (Signed)
Called and spoke to patient.  Patient said that she has 2 pills left.  Informed pt that PCP was out of the office today and would not return back into the office until Wednesday.  Reminded pt about the 3 day turn around time for Rx requests.

## 2019-06-21 NOTE — Telephone Encounter (Signed)
Patient calling to check status of refill. Advised awaiting approval from PCP and  Refill turn around time. Patient states that she is completely out of this medication and is having increased anxiety.

## 2019-06-21 NOTE — Telephone Encounter (Signed)
Medication Refill - Medication: clonazePAM (KLONOPIN) 0.5 MG tablet  Has the patient contacted their pharmacy? Patient stated no refills. Patient is requesting the medication due to anxiety getting worse due to issues with dog.   Preferred Pharmacy (with phone number or street name):  [id number] - [place], [place] - [address] [phone] (Phone) [phone] (Fax)  Agent: Please be advised that RX refills may take up to 3 business days. We ask that you follow-up with your pharmacy.

## 2019-06-22 ENCOUNTER — Ambulatory Visit: Payer: Medicare Other

## 2019-06-22 ENCOUNTER — Other Ambulatory Visit: Payer: Self-pay

## 2019-06-22 DIAGNOSIS — R1084 Generalized abdominal pain: Secondary | ICD-10-CM | POA: Diagnosis not present

## 2019-06-22 DIAGNOSIS — F419 Anxiety disorder, unspecified: Secondary | ICD-10-CM

## 2019-06-22 MED ORDER — CLONAZEPAM 0.5 MG PO TABS: ORAL_TABLET | ORAL | 0 refills | Status: DC

## 2019-06-22 NOTE — Telephone Encounter (Signed)
I have sent refill request to Dr. Derrel Nip in Margaret's absence. I am awaiting response.

## 2019-06-23 ENCOUNTER — Other Ambulatory Visit: Payer: Self-pay

## 2019-06-23 ENCOUNTER — Ambulatory Visit (INDEPENDENT_AMBULATORY_CARE_PROVIDER_SITE_OTHER): Payer: Medicare Other | Admitting: Family

## 2019-06-23 DIAGNOSIS — G8929 Other chronic pain: Secondary | ICD-10-CM | POA: Diagnosis not present

## 2019-06-23 DIAGNOSIS — F419 Anxiety disorder, unspecified: Secondary | ICD-10-CM

## 2019-06-23 DIAGNOSIS — M549 Dorsalgia, unspecified: Secondary | ICD-10-CM | POA: Diagnosis not present

## 2019-06-23 MED ORDER — BUSPIRONE HCL 7.5 MG PO TABS: 7.5000 mg | ORAL_TABLET | Freq: Two times a day (BID) | ORAL | 2 refills | Status: DC

## 2019-06-23 NOTE — Progress Notes (Signed)
This visit type was conducted due to national recommendations for restrictions regarding the COVID-19 pandemic (e.g. social distancing).  This format is felt to be most appropriate for this patient at this time.  All issues noted in this document were discussed and addressed.  No physical exam was performed (except for noted visual exam findings with Video Visits). Virtual Visit via Video Note  I connected with@  on 06/25/19 at  2:30 PM EDT by a video enabled telemedicine application and verified that I am speaking with the correct person using two identifiers.  Location patient: home Location provider:work Persons participating in the virtual visit: patient, provider  I discussed the limitations of evaluation and management by telemedicine and the availability of in person appointments. The patient expressed understanding and agreed to proceed.  Interactive audio and video telecommunications were attempted between this provider and patient, however failed, due to patient having technical difficulties or patient did not have access to video capability.  We continued and completed visit with audio only.   HPI:    Anxiety- worsened over the past week.  having 'panic attacks' over past week. Trouble sleeping.  Lives alone. Dog has been sick, doing 'fine now'.  Think that has been contributory.  Seen at [facility] urgent care and given atarax for sleep which helped.   Takes half tablet 0.25mg  klonopin BID  which helps with anxiety. Started by neurology; last OV 06/23/19. May consider Lyrica if symptoms not resolved.  Would like to increase klonopin. No alcohol use.    Started on klonopin 0.5mg  BID for 'zaps in legs'.   Has tried zoloft in the past without relief.  Continues to have low back pain which worsens anxiety.    ROS: See pertinent positives and negatives per HPI.  Past Medical History:  Diagnosis Date  . Barretts esophagus   . Chest pain    a. 02/2014 Myoview: Ef 50%, no  ischemia.  . Colon polyps   . COPD (chronic obstructive pulmonary disease) ([place])   . Depression with anxiety   . GERD (gastroesophageal reflux disease)   . Neuropathy   . Permanent atrial fibrillation    a. s/p ablation 01/2012 in [facility] by Dr. Boyd Kerbs;  b. On sotalol & Xarelto;  c. 02/2014 Echo: EF 50-55%, mild conc LVH, nl LA size/structure;  d. Recurrent afib 8/15 & 08/29/2014.  [name] Rectal fistula   . Rheumatoid arthritis ([place])    On methotrexate and orencia  . Urinary incontinence   . Vitamin D deficiency     Past Surgical History:  Procedure Laterality Date  . ABDOMINAL HYSTERECTOMY  1992  . CARDIAC ELECTROPHYSIOLOGY STUDY AND ABLATION  2013  . CHOLECYSTECTOMY  1987  . COLONOSCOPY N/A 05/08/2015   Procedure: COLONOSCOPY;  Surgeon: Manya Silvas, MD;  Location: [facility] ENDOSCOPY;  Service: Endoscopy;  Laterality: N/A;  . ESOPHAGOGASTRODUODENOSCOPY N/A 05/06/2015   Procedure: ESOPHAGOGASTRODUODENOSCOPY (EGD);  Surgeon: Lollie Sails, MD;  Location: [facility] ENDOSCOPY;  Service: Endoscopy;  Laterality: N/A;  . LAPAROSCOPIC APPENDECTOMY N/A 02/19/2019   Procedure: APPENDECTOMY LAPAROSCOPIC converted to open appendectomy;  Surgeon: Herbert Pun, MD;  Location: [facility] ORS;  Service: General;  Laterality: N/A;  . OOPHORECTOMY    . oophrectomy Bilateral 1992    Family History  Problem Relation Age of Onset  . Depression Mother   . Pancreatic cancer Mother   . Colon cancer Father   . Breast cancer Sister 10  . Diabetes Brother   . Breast cancer Cousin   .  Neuropathy Neg Hx     SOCIAL HX: former smoker   Current Outpatient Medications:  .  albuterol (PROVENTIL HFA;VENTOLIN HFA) 108 (90 Base) MCG/ACT inhaler, Inhale 1-2 puffs into the lungs every 4 (four) hours as needed for wheezing or shortness of breath., Disp: 1 Inhaler, Rfl: 10 .  clonazePAM (KLONOPIN) 0.5 MG tablet, Take 1/2 tablet daily if needed for anxiety. (Patient taking differently: 0.25 mg 2 (two) times daily as  needed. Take 1/2 tablet daily if needed for anxiety.), Disp: 15 tablet, Rfl: 0 .  cyanocobalamin (,VITAMIN B-12,) 1000 MCG/ML injection, 1000 mcg (1 mg) injection once per week for four weeks, followed by 1000 mcg injection once per month., Disp: 1 mL, Rfl: 15 .  esomeprazole (NEXIUM) 20 MG capsule, Take 1 capsule (20 mg total) by mouth daily., Disp: 90 capsule, Rfl: 1 .  furosemide (LASIX) 20 MG tablet, Take 1 tablet (20 mg) by mouth daily, you may take 1 extra tablet (20 mg) after lunch as needed for increased swelling, weight gain, shortness of breath, Disp: 90 tablet, Rfl: 2 .  gabapentin (NEURONTIN) 600 MG tablet, TAKE 1 TABLET BY MOUTH THREE TIMES A DAY, Disp: 270 tablet, Rfl: 1 .  HUMIRA PEN 40 MG/0.4ML PNKT, Inject 40 mg as directed. Every 2 weeks, Disp: , Rfl:  .  hydroxychloroquine (PLAQUENIL) 200 MG tablet, Take 200 mg by mouth 2 (two) times daily. , Disp: , Rfl:  .  hydrOXYzine (ATARAX/VISTARIL) 25 MG tablet, Take 25 mg by mouth at bedtime as needed., Disp: , Rfl:  .  ibuprofen (ADVIL,MOTRIN) 400 MG tablet, Take 400 mg by mouth every 4 (four) hours as needed. , Disp: , Rfl:  .  lidocaine (XYLOCAINE) 5 % ointment, Apply 1 application topically as needed., Disp: , Rfl:  .  propranolol ER (INDERAL LA) 120 MG 24 hr capsule, Take 1 capsule (120 mg total) by mouth daily., Disp: 90 capsule, Rfl: 3 .  traMADol (ULTRAM) 50 MG tablet, Take 1 tablet (50 mg total) by mouth every 12 (twelve) hours as needed for severe pain., Disp: 30 tablet, Rfl: 0 .  TRELEGY ELLIPTA 100-62.5-25 MCG/INH AEPB, INHALE 1 PUFF INTO THE LUNGS DAILY, Disp: 60 each, Rfl: 2 .  bismuth subsalicylate (PEPTO-BISMOL) 262 MG chewable tablet, Chew 2 tablets (524 mg total) by mouth 3 (three) times daily as needed for indigestion or diarrhea or loose stools. (Patient not taking: Reported on 06/23/2019), Disp: 30 tablet, Rfl: 0 .  busPIRone (BUSPAR) 7.5 MG tablet, Take 1 tablet (7.5 mg total) by mouth 2 (two) times daily for 30 days.,  Disp: 60 tablet, Rfl: 2 .  ELIQUIS 5 MG TABS tablet, Take 1 tablet (5 mg total) by mouth 2 (two) times daily., Disp: 180 tablet, Rfl: 3 .  potassium chloride (K-DUR) 10 MEQ tablet, Take 1 tablet (10 meq) by mouth once daily, take 1 extra tablet (10 meq) after lunch when taking extra lasix (Patient not taking: Reported on 06/23/2019), Disp: 90 tablet, Rfl: 2  Current Facility-Administered Medications:  .  cyanocobalamin ((VITAMIN B-12)) injection 1,000 mcg, 1,000 mcg, Intramuscular, Once, Arnett, Yvetta Coder, FNP  EXAM:  VITALS per patient if applicable:  GENERAL: alert, oriented, appears well and in no acute distress  HEENT: atraumatic, conjunttiva clear, no obvious abnormalities on inspection of external nose and ears  NECK: normal movements of the head and neck  LUNGS: on inspection no signs of respiratory distress, breathing rate appears normal, no obvious gross SOB, gasping or wheezing  CV: no obvious cyanosis  MS: moves all visible extremities without noticeable abnormality  PSYCH/NEURO: pleasant and cooperative, no obvious depression or anxiety, speech and thought processing grossly intact  ASSESSMENT AND PLAN:  Discussed the following assessment and plan:  Problem List Items Addressed This Visit      Other   Chronic back pain    Persistent.  Suspect contributing to overall anxiety.  Referral to Dr Sharlet Salina. Will follow.       Relevant Orders   Ambulatory referral to Orthopedic Surgery   Anxiety - Primary    Worsened. Patient again is insistent that she would like to increase her Klonopin.  Advised her that I do not recommend increasing Klonopin particularly as she is on other sedating agents.  I have advised her I would not increase Klonopin at this time.  We agreed upon a trial of BuSpar and counseling.  She may continue Atarax at bedtime. Close follow up.   I looked up patient on [place] Controlled Substances Reporting System and saw no activity that raised concern of  inappropriate use.  She picked up 30 tablets of Klonopin written by Dr. Tedd Sias 06/21/19.        Relevant Medications   busPIRone (BUSPAR) 7.5 MG tablet   hydrOXYzine (ATARAX/VISTARIL) 25 MG tablet   Other Relevant Orders   Ambulatory referral to Psychology        I discussed the assessment and treatment plan with the patient. The patient was provided an opportunity to ask questions and all were answered. The patient agreed with the plan and demonstrated an understanding of the instructions.   The patient was advised to call back or seek an in-person evaluation if the symptoms worsen or if the condition fails to improve as anticipated.   Mable Paris, FNP   I spent 25 min non face to face w/ pt.

## 2019-06-23 NOTE — Telephone Encounter (Signed)
Filled 06/22/19 by Derrel Nip

## 2019-06-23 NOTE — Patient Instructions (Addendum)
Start buspar.   May take atarax if needed at night.   May continue klonopin   Close follow up  Today we discussed referrals, orders. Brandy Armstrong   I have placed these orders in the system for you.  Please be sure to give [place] a call if you have not heard from our office regarding this. We should hear from [place] within ONE week with information regarding your appointment. If not, please let me know immediately.

## 2019-06-24 ENCOUNTER — Other Ambulatory Visit: Payer: Self-pay

## 2019-06-24 MED ORDER — ELIQUIS 5 MG PO TABS: 5.0000 mg | ORAL_TABLET | Freq: Two times a day (BID) | ORAL | 3 refills | Status: DC

## 2019-06-24 NOTE — Telephone Encounter (Signed)
Please review for refill. Thanks!  

## 2019-06-24 NOTE — Telephone Encounter (Signed)
Eliquis 5mg  refill request received; pt is 73 yrs old, wt-94.8kg, Crea-0.75 on 05/27/2019, Telemedicine visit Brandy Armstrong on 05/27/2019; will send in refill to requested pharmacy.

## 2019-06-25 ENCOUNTER — Encounter: Payer: Self-pay | Admitting: Family

## 2019-06-25 NOTE — Assessment & Plan Note (Addendum)
Worsened. Patient again is insistent that she would like to increase her Klonopin.  Advised her that I do not recommend increasing Klonopin particularly as she is on other sedating agents.  I have advised her I would not increase Klonopin at this time.  We agreed upon a trial of BuSpar and counseling.  She may continue Atarax at bedtime. Close follow up.   I looked up patient on [place] Controlled Substances Reporting System and saw no activity that raised concern of inappropriate use.  She picked up 30 tablets of Klonopin written by Dr. Tedd Sias 06/21/19.

## 2019-06-25 NOTE — Assessment & Plan Note (Signed)
Persistent.  Suspect contributing to overall anxiety.  Referral to Dr Sharlet Salina. Will follow.

## 2019-06-28 ENCOUNTER — Inpatient Hospital Stay (HOSPITAL_BASED_OUTPATIENT_CLINIC_OR_DEPARTMENT_OTHER): Payer: Medicare Other | Admitting: Oncology

## 2019-06-28 ENCOUNTER — Inpatient Hospital Stay: Payer: Medicare Other | Attending: Oncology

## 2019-06-28 ENCOUNTER — Other Ambulatory Visit: Payer: Self-pay

## 2019-06-28 ENCOUNTER — Encounter: Payer: Self-pay | Admitting: Oncology

## 2019-06-28 ENCOUNTER — Inpatient Hospital Stay: Payer: Medicare Other

## 2019-06-28 VITALS — BP 138/85 | HR 98 | Temp 99.5°F | Resp 18 | Wt 202.4 lb

## 2019-06-28 VITALS — BP 135/90 | HR 90 | Resp 18

## 2019-06-28 DIAGNOSIS — J449 Chronic obstructive pulmonary disease, unspecified: Secondary | ICD-10-CM

## 2019-06-28 DIAGNOSIS — E538 Deficiency of other specified B group vitamins: Secondary | ICD-10-CM

## 2019-06-28 DIAGNOSIS — D500 Iron deficiency anemia secondary to blood loss (chronic): Secondary | ICD-10-CM

## 2019-06-28 DIAGNOSIS — M069 Rheumatoid arthritis, unspecified: Secondary | ICD-10-CM

## 2019-06-28 DIAGNOSIS — I4821 Permanent atrial fibrillation: Secondary | ICD-10-CM | POA: Insufficient documentation

## 2019-06-28 DIAGNOSIS — Z87891 Personal history of nicotine dependence: Secondary | ICD-10-CM

## 2019-06-28 DIAGNOSIS — D508 Other iron deficiency anemias: Secondary | ICD-10-CM | POA: Diagnosis not present

## 2019-06-28 DIAGNOSIS — Z79899 Other long term (current) drug therapy: Secondary | ICD-10-CM | POA: Insufficient documentation

## 2019-06-28 DIAGNOSIS — Z803 Family history of malignant neoplasm of breast: Secondary | ICD-10-CM | POA: Diagnosis not present

## 2019-06-28 DIAGNOSIS — Z7901 Long term (current) use of anticoagulants: Secondary | ICD-10-CM | POA: Insufficient documentation

## 2019-06-28 LAB — CBC WITH DIFFERENTIAL/PLATELET
Abs Immature Granulocytes: 0.02 10*3/uL (ref 0.00–0.07)
Basophils Absolute: 0 10*3/uL (ref 0.0–0.1)
Basophils Relative: 0 %
Eosinophils Absolute: 0.1 10*3/uL (ref 0.0–0.5)
Eosinophils Relative: 2 %
HCT: 33.6 % — ABNORMAL LOW (ref 36.0–46.0)
Hemoglobin: 9.9 g/dL — ABNORMAL LOW (ref 12.0–15.0)
Immature Granulocytes: 0 %
Lymphocytes Relative: 26 %
Lymphs Abs: 1.3 10*3/uL (ref 0.7–4.0)
MCH: 23.3 pg — ABNORMAL LOW (ref 26.0–34.0)
MCHC: 29.5 g/dL — ABNORMAL LOW (ref 30.0–36.0)
MCV: 79.2 fL — ABNORMAL LOW (ref 80.0–100.0)
Monocytes Absolute: 0.4 10*3/uL (ref 0.1–1.0)
Monocytes Relative: 8 %
Neutro Abs: 3.1 10*3/uL (ref 1.7–7.7)
Neutrophils Relative %: 64 %
Platelets: 228 10*3/uL (ref 150–400)
RBC: 4.24 MIL/uL (ref 3.87–5.11)
RDW: 15 % (ref 11.5–15.5)
WBC: 5 10*3/uL (ref 4.0–10.5)
nRBC: 0 % (ref 0.0–0.2)

## 2019-06-28 LAB — FERRITIN: Ferritin: 9 ng/mL — ABNORMAL LOW (ref 11–307)

## 2019-06-28 LAB — IRON AND TIBC
Iron: 29 ug/dL (ref 28–170)
Saturation Ratios: 7 % — ABNORMAL LOW (ref 10.4–31.8)
TIBC: 411 ug/dL (ref 250–450)
UIBC: 382 ug/dL

## 2019-06-28 LAB — VITAMIN B12: Vitamin B-12: 261 pg/mL (ref 180–914)

## 2019-06-28 MED ORDER — SODIUM CHLORIDE 0.9 % IV SOLN
INTRAVENOUS | Status: DC
  Administered 2019-06-28: 15:00:00 via INTRAVENOUS
  Filled 2019-06-28: qty 250

## 2019-06-28 MED ORDER — IRON SUCROSE 20 MG/ML IV SOLN
200.0000 mg | Freq: Once | INTRAVENOUS | Status: AC
  Administered 2019-06-28: 200 mg via INTRAVENOUS
  Filled 2019-06-28: qty 10

## 2019-06-28 NOTE — Progress Notes (Signed)
Patient here for follow up. Pt states she has had increased anxiety.

## 2019-06-28 NOTE — Progress Notes (Signed)
Hematology/Oncology Follow up note [facility] [phone] [phone]   Patient Care Team: [name], FNP as PCP - General (Family Medicine) Minna Merritts, MD as PCP - Cardiology (Cardiology)  REFERRING PROVIDER: [name], FNP  REASON FOR VISIT Follow up for treatment of anemia and iron deficiency anemia.   HISTORY OF PRESENTING ILLNESS:  Brandy Armstrong is a  73 y.o.  female with PMH listed below who was referred to me for evaluation of anemia and elevated iron level.  Patient recently had lab work done which revealed anemia with hemoglobin count at 10.2 on 05/04/2018.  I reviewed patient's previous labs, Anemia is chronic, dated back to 2014. She recalls having 1 PRBC transfusion in the past and was told she has iron deficiency anemia. She takes oral OTC  iron supplements and anemia has improved.  Associated with fatigue, lack of energy. Denies weight loss, easy bruising, hematochezia, hemoptysis.  Last colonoscopy 2016 revealed polyps which were removed. No malignancy was found.  Last EGD: 2016 which showed gastritis.   Reviewed recent iron panel which showed elevated iron at 346, TSAT elevated at 89, ferritin 26. Denies any family history of hemachromatosis.  Recent B12 level showed decreased B12, she reports that she has been started on parental B12 injections with her PCP.  History RF, she takes plaquenil 200mg  daily.  History of Afib, takes Elqiuis 5mg  BID.   INTERVAL HISTORY Brandy Armstrong is a 73 y.o. female who has above history reviewed by me today presents for follow-up for management of iron deficiency anemia.  Patient reports increased anxiety.  Panic attack.  She went to another clinic urgent care and was given Atarax for sleep which helped Recently seen by primary care provider.  Note reviewed. Patient takes 0.25 mg Klonopin twice daily.  PCP recommended a trial of BuSpar and counseling.  She feels tired  and fatigued. Patient is on Eliquis for atrial fibrillation. Tolerating well.  Denies hematochezia, hematuria, hematemesis, epistaxis, black tarry stool or easy bruising.  Review of Systems  Constitutional: Positive for malaise/fatigue. Negative for chills, fever and weight loss.  HENT: Negative for sore throat.   Eyes: Negative for redness.  Respiratory: Positive for shortness of breath. Negative for cough and wheezing.   Cardiovascular: Negative for chest pain, palpitations and leg swelling.  Gastrointestinal: Negative for abdominal pain, blood in stool, nausea and vomiting.  Genitourinary: Negative for dysuria.  Musculoskeletal: Negative for myalgias.  Skin: Negative for rash.  Neurological: Negative for dizziness, tingling and tremors.  Endo/Heme/Allergies: Does not bruise/bleed easily.  Psychiatric/Behavioral: Negative for hallucinations.       Anxiety    MEDICAL HISTORY:  Past Medical History:  Diagnosis Date  . Barretts esophagus   . Chest pain    a. 02/2014 Myoview: Ef 50%, no ischemia.  . Colon polyps   . COPD (chronic obstructive pulmonary disease) ([place])   . Depression with anxiety   . GERD (gastroesophageal reflux disease)   . Neuropathy   . Permanent atrial fibrillation    a. s/p ablation 01/2012 in [facility] by Dr. Boyd Kerbs;  b. On sotalol & Xarelto;  c. 02/2014 Echo: EF 50-55%, mild conc LVH, nl LA size/structure;  d. Recurrent afib 8/15 & 08/29/2014.  [name] Rectal fistula   . Rheumatoid arthritis ([place])    On methotrexate and orencia  . Urinary incontinence   . Vitamin D deficiency     SURGICAL HISTORY: Past Surgical History:  Procedure Laterality Date  .  ABDOMINAL HYSTERECTOMY  1992  . CARDIAC ELECTROPHYSIOLOGY STUDY AND ABLATION  2013  . CHOLECYSTECTOMY  1987  . COLONOSCOPY N/A 05/08/2015   Procedure: COLONOSCOPY;  Surgeon: Manya Silvas, MD;  Location: [facility] ENDOSCOPY;  Service: Endoscopy;  Laterality: N/A;  . ESOPHAGOGASTRODUODENOSCOPY N/A 05/06/2015    Procedure: ESOPHAGOGASTRODUODENOSCOPY (EGD);  Surgeon: Lollie Sails, MD;  Location: [facility] ENDOSCOPY;  Service: Endoscopy;  Laterality: N/A;  . LAPAROSCOPIC APPENDECTOMY N/A 02/19/2019   Procedure: APPENDECTOMY LAPAROSCOPIC converted to open appendectomy;  Surgeon: Herbert Pun, MD;  Location: [facility] ORS;  Service: General;  Laterality: N/A;  . OOPHORECTOMY    . oophrectomy Bilateral 1992    SOCIAL HISTORY: Social History   Socioeconomic History  . Marital status: Single    Spouse name: Not on file  . Number of children: 0  . Years of education: 23  . Highest education level: Not on file  Occupational History  . Occupation: Retired  Scientific laboratory technician  . Financial resource strain: Not on file  . Food insecurity    Worry: Not on file    Inability: Not on file  . Transportation needs    Medical: Not on file    Non-medical: Not on file  Tobacco Use  . Smoking status: Former Smoker    Packs/day: 1.00    Years: 40.00    Pack years: 40.00    Quit date: 12/16/2011    Years since quitting: 7.5  . Smokeless tobacco: Never Used  Substance and Sexual Activity  . Alcohol use: Not Currently    Alcohol/week: 0.0 standard drinks    Comment: Occasionally has a drink  . Drug use: No  . Sexual activity: Never  Lifestyle  . Physical activity    Days per week: Not on file    Minutes per session: Not on file  . Stress: Not on file  Relationships  . Social Herbalist on phone: Not on file    Gets together: Not on file    Attends religious service: Not on file    Active member of club or organization: Not on file    Attends meetings of clubs or organizations: Not on file    Relationship status: Not on file  . Intimate partner violence    Fear of current or ex partner: Not on file    Emotionally abused: Not on file    Physically abused: Not on file    Forced sexual activity: Not on file  Other Topics Concern  . Not on file  Social History Narrative   Brandy Armstrong was  born and reared in [place]. She attended [employer] and graduated in 1971 with her [employer] in Education. 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 She then worked for a [employer] in [place]. She is single. She is currently retired. She loves reading. She loves to spend time with her dog.       Caffeine use: 2 cups coffee per day   2 glasses iced tea/ day   Right-handed    FAMILY HISTORY: Family History  Problem Relation Age of Onset  . Depression Mother   . Pancreatic cancer Mother   . Colon cancer Father   . Breast cancer Sister 72  . Diabetes Brother   . Breast cancer Cousin   . Neuropathy Neg Hx     ALLERGIES:  is allergic to latex.  MEDICATIONS:  Current Outpatient Medications  Medication Sig  Dispense Refill  . albuterol (PROVENTIL HFA;VENTOLIN HFA) 108 (90 Base) MCG/ACT inhaler Inhale 1-2 puffs into the lungs every 4 (four) hours as needed for wheezing or shortness of breath. 1 Inhaler 10  . bismuth subsalicylate (PEPTO-BISMOL) 262 MG chewable tablet Chew 2 tablets (524 mg total) by mouth 3 (three) times daily as needed for indigestion or diarrhea or loose stools. 30 tablet 0  . busPIRone (BUSPAR) 7.5 MG tablet Take 1 tablet (7.5 mg total) by mouth 2 (two) times daily for 30 days. 60 tablet 2  . clonazePAM (KLONOPIN) 0.5 MG tablet Take 1/2 tablet daily if needed for anxiety. (Patient taking differently: 0.25 mg 2 (two) times daily as needed. Take 1/2 tablet daily if needed for anxiety.) 15 tablet 0  . ELIQUIS 5 MG TABS tablet Take 1 tablet (5 mg total) by mouth 2 (two) times daily. 180 tablet 3  . esomeprazole (NEXIUM) 20 MG capsule Take 1 capsule (20 mg total) by mouth daily. 90 capsule 1  . furosemide (LASIX) 20 MG tablet Take 1 tablet (20 mg) by mouth daily, you may take 1 extra tablet (20 mg) after lunch as needed for increased swelling, weight gain, shortness of breath 90 tablet 2  . gabapentin (NEURONTIN) 600  MG tablet TAKE 1 TABLET BY MOUTH THREE TIMES A DAY 270 tablet 1  . HUMIRA PEN 40 MG/0.4ML PNKT Inject 40 mg as directed. Every 2 weeks    . hydroxychloroquine (PLAQUENIL) 200 MG tablet Take 200 mg by mouth 2 (two) times daily.     . hydrOXYzine (ATARAX/VISTARIL) 25 MG tablet Take 25 mg by mouth at bedtime as needed.    [name] ibuprofen (ADVIL,MOTRIN) 400 MG tablet Take 400 mg by mouth every 4 (four) hours as needed.     . lidocaine (XYLOCAINE) 5 % ointment Apply 1 application topically as needed.    . potassium chloride (K-DUR) 10 MEQ tablet Take 1 tablet (10 meq) by mouth once daily, take 1 extra tablet (10 meq) after lunch when taking extra lasix 90 tablet 2  . propranolol ER (INDERAL LA) 120 MG 24 hr capsule Take 1 capsule (120 mg total) by mouth daily. 90 capsule 3  . traMADol (ULTRAM) 50 MG tablet Take 1 tablet (50 mg total) by mouth every 12 (twelve) hours as needed for severe pain. 30 tablet 0  . TRELEGY ELLIPTA 100-62.5-25 MCG/INH AEPB INHALE 1 PUFF INTO THE LUNGS DAILY 60 each 2  . cyanocobalamin (,VITAMIN B-12,) 1000 MCG/ML injection 1000 mcg (1 mg) injection once per week for four weeks, followed by 1000 mcg injection once per month. (Patient not taking: Reported on 06/28/2019) 1 mL 15   Current Facility-Administered Medications  Medication Dose Route Frequency Provider Last Rate Last Dose  . cyanocobalamin ((VITAMIN B-12)) injection 1,000 mcg  1,000 mcg Intramuscular Once [name], FNP         PHYSICAL EXAMINATION: ECOG PERFORMANCE STATUS: 1 - Symptomatic but completely ambulatory Vitals:   06/28/19 1404  BP: 138/85  Pulse: 98  Resp: 18  Temp: 99.5 F (37.5 C)   Filed Weights   06/28/19 1404  Weight: 202 lb 6.4 oz (91.8 kg)    Physical Exam Constitutional:      General: She is not in acute distress.    Appearance: She is obese.     Comments: Walks with a walker  HENT:     Head: Normocephalic and atraumatic.  Eyes:     General: No scleral icterus.     Conjunctiva/sclera: Conjunctivae  normal.     Pupils: Pupils are equal, round, and reactive to light.  Neck:     Musculoskeletal: Normal range of motion and neck supple.  Cardiovascular:     Rate and Rhythm: Normal rate and regular rhythm.     Heart sounds: Normal heart sounds.  Pulmonary:     Effort: Pulmonary effort is normal. No respiratory distress.     Breath sounds: No wheezing.     Comments: Decreased breath sound bilaterally Chest:     Chest wall: No tenderness.  Abdominal:     General: Bowel sounds are normal. There is no distension.     Palpations: Abdomen is soft. There is no mass.     Tenderness: There is no abdominal tenderness.  Musculoskeletal: Normal range of motion.        General: No deformity.  Lymphadenopathy:     Cervical: No cervical adenopathy.  Skin:    General: Skin is warm and dry.     Findings: No erythema or rash.  Neurological:     Mental Status: She is alert and oriented to person, place, and time.     Cranial Nerves: No cranial nerve deficit.     Coordination: Coordination normal.  Psychiatric:     Comments: Anxious      LABORATORY DATA:  I have reviewed the data as listed Lab Results  Component Value Date   WBC 5.0 06/28/2019   HGB 9.9 (L) 06/28/2019   HCT 33.6 (L) 06/28/2019   MCV 79.2 (L) 06/28/2019   PLT 228 06/28/2019   Recent Labs    02/21/19 0443 02/22/19 0641  03/02/19 0537 03/03/19 0600 04/15/19 0901 05/27/19 1149  NA 138 138   < > 137 138 139 139  K 4.3 4.4   < > 4.1 3.6 3.8 3.2*  CL 109 106   < > 105 107 102 105  CO2 21* 24   < > 24 23 29 27   GLUCOSE 116* 105*   < > 111* 88 81 98  BUN 16 15   < > 9 8 9 15   CREATININE 0.77 0.69   < > 0.69 0.56 0.87 0.75  CALCIUM 8.5* 8.8*   < > 8.9 8.6* 8.7 8.8*  GFRNONAA >60 >60   < > >60 >60  --  >60  GFRAA >60 >60   < > >60 >60  --  >60  PROT 6.1* 6.3*  --   --   --  6.2  --   ALBUMIN 2.8* 2.7*  --   --   --  3.1*  --   AST 19 16  --   --   --  21  --   ALT 10 9  --   --   --   7  --   ALKPHOS 50 53  --   --   --  82  --   BILITOT 0.8 0.7  --   --   --  0.6  --    < > = values in this interval not displayed.       ASSESSMENT & PLAN:  1. Iron deficiency anemia due to chronic blood loss   2. Chronic anticoagulation   3. B12 deficiency    #Iron deficiency Anemia:  Labs reviewed and discussed with patient. Iron panel is consistent with severe iron deficiency.  Ferritin has decreased to 9.  Iron saturation 7. Vitamin B12 at 261. Hemoglobin has decreased to 9.9, MCV decreased to 79.2.  Recommend patient  to proceed with IV Venofer 200 mg x 4 Recent UA on 06/22/2019 showed no hematuria.  Patient may have ongoing blood loss from GI tract.  She has a history of small bowel AVMs. Recommend patient to follow-up with Dr. Vira Agar to discuss management of her AVMs. This was discussed 51-month ago and patient want to defer at that point.  We rediscussed this issue and encourage patient to follow-up with Dr. Vira Agar.  Chronic anticoagulation on Eliquis.  History of vitamin B12 deficiency.  Most recent B12 count is 261.  Patient to continue vitamin B12 injections at primary care physician's office.   All questions were answered. The patient knows to call the clinic with any problems questions or concerns.  Orders Placed This Encounter  Procedures  . CBC with Differential/Platelet    Standing Status:   Future    Standing Expiration Date:   06/27/2020  . Iron and TIBC    Standing Status:   Future    Standing Expiration Date:   06/27/2020  . Ferritin    Standing Status:   Future    Standing Expiration Date:   06/27/2020    Return of visit:  10 weeks.     Earlie Server, MD, PhD 06/28/2019

## 2019-07-01 ENCOUNTER — Encounter: Payer: Self-pay | Admitting: Family

## 2019-07-01 ENCOUNTER — Other Ambulatory Visit: Payer: Self-pay

## 2019-07-05 ENCOUNTER — Ambulatory Visit (INDEPENDENT_AMBULATORY_CARE_PROVIDER_SITE_OTHER): Payer: Medicare Other | Admitting: Family

## 2019-07-05 ENCOUNTER — Other Ambulatory Visit: Payer: Self-pay | Admitting: Family

## 2019-07-05 ENCOUNTER — Inpatient Hospital Stay: Payer: Medicare Other | Attending: Oncology

## 2019-07-05 ENCOUNTER — Encounter: Payer: Self-pay | Admitting: Family

## 2019-07-05 ENCOUNTER — Other Ambulatory Visit: Payer: Self-pay

## 2019-07-05 VITALS — BP 128/84 | HR 66 | Temp 97.5°F | Resp 18

## 2019-07-05 DIAGNOSIS — I714 Abdominal aortic aneurysm, without rupture, unspecified: Secondary | ICD-10-CM

## 2019-07-05 DIAGNOSIS — F419 Anxiety disorder, unspecified: Secondary | ICD-10-CM

## 2019-07-05 DIAGNOSIS — F32A Depression, unspecified: Secondary | ICD-10-CM

## 2019-07-05 DIAGNOSIS — D649 Anemia, unspecified: Secondary | ICD-10-CM | POA: Diagnosis not present

## 2019-07-05 DIAGNOSIS — D500 Iron deficiency anemia secondary to blood loss (chronic): Secondary | ICD-10-CM

## 2019-07-05 DIAGNOSIS — D509 Iron deficiency anemia, unspecified: Secondary | ICD-10-CM | POA: Insufficient documentation

## 2019-07-05 DIAGNOSIS — F329 Major depressive disorder, single episode, unspecified: Secondary | ICD-10-CM

## 2019-07-05 DIAGNOSIS — E538 Deficiency of other specified B group vitamins: Secondary | ICD-10-CM

## 2019-07-05 MED ORDER — FLUOXETINE HCL 20 MG PO TABS: 20.0000 mg | ORAL_TABLET | Freq: Every day | ORAL | 3 refills | Status: DC

## 2019-07-05 MED ORDER — IRON SUCROSE 20 MG/ML IV SOLN
200.0000 mg | Freq: Once | INTRAVENOUS | Status: AC
  Administered 2019-07-05: 200 mg via INTRAVENOUS
  Filled 2019-07-05: qty 10

## 2019-07-05 MED ORDER — SODIUM CHLORIDE 0.9 % IV SOLN
INTRAVENOUS | Status: DC
  Administered 2019-07-05: 14:00:00 via INTRAVENOUS
  Filled 2019-07-05: qty 250

## 2019-07-05 NOTE — Progress Notes (Signed)
Patient has been scheduled for B-12 x 3 plus 1 month. I have also rescheduled patient with Dr. Delana Meyer.

## 2019-07-05 NOTE — Progress Notes (Signed)
Patient scheduled 07/26/19 at 8:30 with Dr. Ronalee Belts.

## 2019-07-05 NOTE — Assessment & Plan Note (Signed)
Lost to follow up to vascular. We are calling to make her an appointment.

## 2019-07-05 NOTE — Assessment & Plan Note (Signed)
Patient needs lifelong b12 injection. 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 Scheduled again.

## 2019-07-05 NOTE — Progress Notes (Signed)
This visit type was conducted due to national recommendations for restrictions regarding the COVID-19 pandemic (e.g. social distancing).  This format is felt to be most appropriate for this patient at this time.  All issues noted in this document were discussed and addressed.  No physical exam was performed (except for noted visual exam findings with Video Visits). Virtual Visit via Video Note  I connected with@  on 07/05/19 at 10:30 AM EDT by a video enabled telemedicine application and verified that I am speaking with the correct person using two identifiers.  Location patient: home Location provider:work  Persons participating in the virtual visit: patient, provider  I discussed the limitations of evaluation and management by telemedicine and the availability of in person appointments. The patient expressed understanding and agreed to proceed.   HPI:   Anxiety-Had iron infusion last week and feels that anxiety decreased. "feels so much better.'   taking prn buspar, atarax. 'not taking a lot of it.'   Atarax is very helpful for sleep. Got a call for counseling.   Still has days when 'feels very down'. Thinks pandemic has contributory.  Feels like no where to go or talk too. Feels lifted when 'busy.'  [place] Would like to do PT again as still using her walker.    Has one b12 injection in our office.   hgb 9.9  Rarely uses tramadol.  Colonoscopy/EGD  04/2015 with Dr Elliott/ [place] [facility];  Gastritis -advised no NSAIDs. However she takes advil prn  AAA- Dr Ronalee Belts; missed return in 6 months with an aortic duplex.  ROS: See pertinent positives and negatives per HPI.  Past Medical History:  Diagnosis Date  . Barretts esophagus   . Chest pain    a. 02/2014 Myoview: Ef 50%, no ischemia.  . Colon polyps   . COPD (chronic obstructive pulmonary disease) ([place])   . Depression with anxiety   . GERD (gastroesophageal reflux disease)   . Neuropathy   . Permanent atrial fibrillation     a. s/p ablation 01/2012 in [facility] by Dr. Boyd Kerbs;  b. On sotalol & Xarelto;  c. 02/2014 Echo: EF 50-55%, mild conc LVH, nl LA size/structure;  d. Recurrent afib 8/15 & 08/29/2014.  [name] Rectal fistula   . Rheumatoid arthritis ([place])    On methotrexate and orencia  . Urinary incontinence   . Vitamin D deficiency     Past Surgical History:  Procedure Laterality Date  . ABDOMINAL HYSTERECTOMY  1992  . CARDIAC ELECTROPHYSIOLOGY STUDY AND ABLATION  2013  . CHOLECYSTECTOMY  1987  . COLONOSCOPY N/A 05/08/2015   Procedure: COLONOSCOPY;  Surgeon: Manya Silvas, MD;  Location: [facility] ENDOSCOPY;  Service: Endoscopy;  Laterality: N/A;  . ESOPHAGOGASTRODUODENOSCOPY N/A 05/06/2015   Procedure: ESOPHAGOGASTRODUODENOSCOPY (EGD);  Surgeon: Lollie Sails, MD;  Location: [facility] ENDOSCOPY;  Service: Endoscopy;  Laterality: N/A;  . LAPAROSCOPIC APPENDECTOMY N/A 02/19/2019   Procedure: APPENDECTOMY LAPAROSCOPIC converted to open appendectomy;  Surgeon: Herbert Pun, MD;  Location: [facility] ORS;  Service: General;  Laterality: N/A;  . OOPHORECTOMY    . oophrectomy Bilateral 1992    Family History  Problem Relation Age of Onset  . Depression Mother   . Pancreatic cancer Mother   . Colon cancer Father   . Breast cancer Sister 12  . Diabetes Brother   . Breast cancer Cousin   . Neuropathy Neg Hx     SOCIAL HX: former smoker   Current Outpatient Medications:  .  albuterol (PROVENTIL HFA;VENTOLIN HFA) 108 (90 Base)  MCG/ACT inhaler, Inhale 1-2 puffs into the lungs every 4 (four) hours as needed for wheezing or shortness of breath., Disp: 1 Inhaler, Rfl: 10 .  bismuth subsalicylate (PEPTO-BISMOL) 262 MG chewable tablet, Chew 2 tablets (524 mg total) by mouth 3 (three) times daily as needed for indigestion or diarrhea or loose stools., Disp: 30 tablet, Rfl: 0 .  busPIRone (BUSPAR) 7.5 MG tablet, Take 1 tablet (7.5 mg total) by mouth 2 (two) times daily for 30 days., Disp: 60 tablet, Rfl: 2 .  clonazePAM  (KLONOPIN) 0.5 MG tablet, Take 1/2 tablet daily if needed for anxiety. (Patient taking differently: 0.25 mg 2 (two) times daily as needed. Take 1/2 tablet daily if needed for anxiety.), Disp: 15 tablet, Rfl: 0 .  ELIQUIS 5 MG TABS tablet, Take 1 tablet (5 mg total) by mouth 2 (two) times daily., Disp: 180 tablet, Rfl: 3 .  esomeprazole (NEXIUM) 20 MG capsule, Take 1 capsule (20 mg total) by mouth daily., Disp: 90 capsule, Rfl: 1 .  furosemide (LASIX) 20 MG tablet, Take 1 tablet (20 mg) by mouth daily, you may take 1 extra tablet (20 mg) after lunch as needed for increased swelling, weight gain, shortness of breath, Disp: 90 tablet, Rfl: 2 .  gabapentin (NEURONTIN) 600 MG tablet, TAKE 1 TABLET BY MOUTH THREE TIMES A DAY, Disp: 270 tablet, Rfl: 1 .  HUMIRA PEN 40 MG/0.4ML PNKT, Inject 40 mg as directed. Every 2 weeks, Disp: , Rfl:  .  hydroxychloroquine (PLAQUENIL) 200 MG tablet, Take 200 mg by mouth 2 (two) times daily. , Disp: , Rfl:  .  hydrOXYzine (ATARAX/VISTARIL) 25 MG tablet, Take 25 mg by mouth at bedtime as needed., Disp: , Rfl:  .  lidocaine (XYLOCAINE) 5 % ointment, Apply 1 application topically as needed., Disp: , Rfl:  .  potassium chloride (K-DUR) 10 MEQ tablet, Take 1 tablet (10 meq) by mouth once daily, take 1 extra tablet (10 meq) after lunch when taking extra lasix, Disp: 90 tablet, Rfl: 2 .  propranolol ER (INDERAL LA) 120 MG 24 hr capsule, Take 1 capsule (120 mg total) by mouth daily., Disp: 90 capsule, Rfl: 3 .  traMADol (ULTRAM) 50 MG tablet, Take 1 tablet (50 mg total) by mouth every 12 (twelve) hours as needed for severe pain., Disp: 30 tablet, Rfl: 0 .  TRELEGY ELLIPTA 100-62.5-25 MCG/INH AEPB, INHALE 1 PUFF INTO THE LUNGS DAILY, Disp: 60 each, Rfl: 2 .  FLUoxetine (PROZAC) 20 MG capsule, Take one capsule by mouth daily., Disp: 90 capsule, Rfl: 0  EXAM:  VITALS per patient if applicable:  GENERAL: alert, oriented, appears well and in no acute distress  HEENT: atraumatic,  conjunttiva clear, no obvious abnormalities on inspection of external nose and ears  NECK: normal movements of the head and neck  LUNGS: on inspection no signs of respiratory distress, breathing rate appears normal, no obvious gross SOB, gasping or wheezing  CV: no obvious cyanosis  MS: moves all visible extremities without noticeable abnormality  PSYCH/NEURO: pleasant and cooperative, no obvious depression or anxiety, speech and thought processing grossly intact  ASSESSMENT AND PLAN:  Discussed the following assessment and plan:  Problem List Items Addressed This Visit      Cardiovascular and Mediastinum   AAA (abdominal aortic aneurysm) without rupture ([facility])    Lost to follow up to vascular. We are calling to make her an appointment.         Other   Anemia    Reviewed chart. Suspect gastritis  constibutory. Advised NO NSAIDs, even PRN. She will trial tyelnol.  Pending stool cards. Referral placed back to Dr Vira Agar to follow anemia.       Relevant Orders   Ambulatory referral to Gastroenterology   Fecal occult blood, imunochemical   Anxiety and depression    Improved anxiety. Suspect anemia, being homebound contributory. Breakthrough depression. Trial prozac. She will stay on buspar BID, atarax qhs. Pending counseling, PT.        B12 deficiency    Patient needs lifelong b12 injection. 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 Scheduled again.       Anxiety - Primary   Relevant Orders   Ambulatory referral to Physical Therapy         I discussed the assessment and treatment plan with the patient. The patient was provided an opportunity to ask questions and all were answered. The patient agreed with the plan and demonstrated an understanding of the instructions.   The patient was advised to call back or seek an in-person evaluation if the symptoms worsen or if the condition fails to improve as anticipated.   Mable Paris, FNP

## 2019-07-05 NOTE — Patient Instructions (Addendum)
Trial of prozac  One idea:   Take Prozac early am.   Buspar mid morning and late afternoon  Atarax an hour before bedtime ( approximately)  Continue RARE use of tramadol as it interacts with Prozac.  No ADVIL. Try tyelonol arthritis.   Follow up with Dr Ronalee Belts ( vascular)  Today we discussed referrals, orders. Dr Vira Agar ( GI) for anemia.   I have placed these orders in the system for you.  Please be sure to give [place] a call if you have not heard from our office regarding this. We should hear from [place] within ONE week with information regarding your appointment. If not, please let me know immediately.      Stool cards - please come pick up.   Close follow up with me

## 2019-07-05 NOTE — Assessment & Plan Note (Signed)
Reviewed chart. Suspect gastritis constibutory. Advised NO NSAIDs, even PRN. She will trial tyelnol.  Pending stool cards. Referral placed back to Dr Vira Agar to follow anemia.

## 2019-07-05 NOTE — Assessment & Plan Note (Addendum)
Improved anxiety. Suspect anemia, being homebound contributory. Breakthrough depression. Trial prozac. She will stay on buspar BID, atarax qhs. Pending counseling, PT.

## 2019-07-07 ENCOUNTER — Other Ambulatory Visit: Payer: Self-pay

## 2019-07-07 ENCOUNTER — Encounter: Payer: Self-pay | Admitting: Family

## 2019-07-07 DIAGNOSIS — R200 Anesthesia of skin: Secondary | ICD-10-CM | POA: Diagnosis not present

## 2019-07-07 DIAGNOSIS — R202 Paresthesia of skin: Secondary | ICD-10-CM | POA: Diagnosis not present

## 2019-07-07 DIAGNOSIS — F419 Anxiety disorder, unspecified: Secondary | ICD-10-CM

## 2019-07-07 MED ORDER — FLUOXETINE HCL 20 MG PO CAPS: ORAL_CAPSULE | ORAL | 0 refills | Status: DC

## 2019-07-08 ENCOUNTER — Ambulatory Visit: Payer: Medicare Other | Admitting: *Deleted

## 2019-07-08 ENCOUNTER — Other Ambulatory Visit: Payer: Self-pay

## 2019-07-09 ENCOUNTER — Other Ambulatory Visit: Payer: Self-pay

## 2019-07-09 ENCOUNTER — Ambulatory Visit (INDEPENDENT_AMBULATORY_CARE_PROVIDER_SITE_OTHER): Payer: Medicare Other | Admitting: Family

## 2019-07-09 ENCOUNTER — Emergency Department: Payer: Medicare Other

## 2019-07-09 ENCOUNTER — Encounter: Payer: Self-pay | Admitting: *Deleted

## 2019-07-09 ENCOUNTER — Encounter: Payer: Self-pay | Admitting: Family

## 2019-07-09 ENCOUNTER — Emergency Department
Admission: EM | Admit: 2019-07-09 | Discharge: 2019-07-09 | Disposition: A | Discharge status: Home / Self Care | Payer: Medicare Other | Attending: Emergency Medicine | Admitting: Emergency Medicine

## 2019-07-09 DIAGNOSIS — I5032 Chronic diastolic (congestive) heart failure: Secondary | ICD-10-CM | POA: Diagnosis not present

## 2019-07-09 DIAGNOSIS — Z79899 Other long term (current) drug therapy: Secondary | ICD-10-CM | POA: Diagnosis not present

## 2019-07-09 DIAGNOSIS — J449 Chronic obstructive pulmonary disease, unspecified: Secondary | ICD-10-CM | POA: Diagnosis not present

## 2019-07-09 DIAGNOSIS — Z87891 Personal history of nicotine dependence: Secondary | ICD-10-CM | POA: Diagnosis not present

## 2019-07-09 DIAGNOSIS — I4821 Permanent atrial fibrillation: Secondary | ICD-10-CM | POA: Insufficient documentation

## 2019-07-09 DIAGNOSIS — F419 Anxiety disorder, unspecified: Secondary | ICD-10-CM | POA: Diagnosis not present

## 2019-07-09 DIAGNOSIS — I110 Hypertensive heart disease with heart failure: Secondary | ICD-10-CM | POA: Diagnosis present

## 2019-07-09 DIAGNOSIS — Z7901 Long term (current) use of anticoagulants: Secondary | ICD-10-CM | POA: Insufficient documentation

## 2019-07-09 DIAGNOSIS — R079 Chest pain, unspecified: Secondary | ICD-10-CM

## 2019-07-09 DIAGNOSIS — R0789 Other chest pain: Secondary | ICD-10-CM | POA: Diagnosis not present

## 2019-07-09 LAB — TROPONIN I (HIGH SENSITIVITY)
Troponin I (High Sensitivity): 9 ng/L (ref <18)
Troponin I (High Sensitivity): 10 ng/L (ref <18)

## 2019-07-09 LAB — CBC
HCT: 36.7 % (ref 36.0–46.0)
Hemoglobin: 10.7 g/dL — ABNORMAL LOW (ref 12.0–15.0)
MCH: 23.8 pg — ABNORMAL LOW (ref 26.0–34.0)
MCHC: 29.2 g/dL — ABNORMAL LOW (ref 30.0–36.0)
MCV: 81.6 fL (ref 80.0–100.0)
Platelets: 204 10*3/uL (ref 150–400)
RBC: 4.5 MIL/uL (ref 3.87–5.11)
RDW: 17.1 % — ABNORMAL HIGH (ref 11.5–15.5)
WBC: 5.1 10*3/uL (ref 4.0–10.5)
nRBC: 0 % (ref 0.0–0.2)

## 2019-07-09 LAB — BASIC METABOLIC PANEL
Anion gap: 8 (ref 5–15)
BUN: 10 mg/dL (ref 8–23)
CO2: 27 mmol/L (ref 22–32)
Calcium: 9.1 mg/dL (ref 8.9–10.3)
Chloride: 104 mmol/L (ref 98–111)
Creatinine, Ser: 0.79 mg/dL (ref 0.44–1.00)
GFR calc Af Amer: >60 mL/min (ref >60)
GFR calc non Af Amer: >60 mL/min (ref >60)
Glucose, Bld: 99 mg/dL (ref 70–99)
Potassium: 3.9 mmol/L (ref 3.5–5.1)
Sodium: 139 mmol/L (ref 135–145)

## 2019-07-09 NOTE — ED Provider Notes (Signed)
[facility] Emergency Department Provider Note  ____________________________________________  Time seen: Approximately 6:18 PM  I have reviewed the triage vital signs and the nursing notes.   HISTORY  Chief Complaint Hypertension and Chest Pain    HPI Brandy Armstrong is a 73 y.o. female with a history of Barrett's esophagus, GERD, COPD, permanent atrial fibrillation, rheumatoid arthritis who comes to the ED complaining of central chest pain that started earlier today at about 10:00 AM.  Is intermittent, worse lying down, better sitting upright, described as burning, radiating up to the throat.  No shortness of breath vomiting or diaphoresis.  No dizziness or syncope.  Not exertional, not pleuritic.  Patient is eating and drinking okay.  She does note that she is been very stressed lately because her dog recently had an episode of pancreatitis which was a new event and she is worried about the dog.  Also, a friend of hers just suddenly died this morning before the symptoms started.  Her doctor had recently initiated Prozac for her due to anxiety issues.      Past Medical History:  Diagnosis Date  . Barretts esophagus   . Chest pain    a. 02/2014 Myoview: Ef 50%, no ischemia.  . Colon polyps   . COPD (chronic obstructive pulmonary disease) ([place])   . Depression with anxiety   . GERD (gastroesophageal reflux disease)   . Neuropathy   . Permanent atrial fibrillation    a. s/p ablation 01/2012 in [facility] by Dr. Boyd Kerbs;  b. On sotalol & Xarelto;  c. 02/2014 Echo: EF 50-55%, mild conc LVH, nl LA size/structure;  d. Recurrent afib 8/15 & 08/29/2014.  [name] Rectal fistula   . Rheumatoid arthritis ([place])    On methotrexate and orencia  . Urinary incontinence   . Vitamin D deficiency      Patient Active Problem List   Diagnosis Date Noted  . Chest pain 07/09/2019  . Loose stools 04/07/2019  . Anxiety 04/02/2019  . Acute appendicitis   . Acute appendicitis with  localized peritonitis 02/19/2019  . Chronic diastolic CHF (congestive heart failure) ([place]) 02/10/2019  . Atherosclerosis of aorta ([place]) 02/05/2019  . AAA (abdominal aortic aneurysm) without rupture ([place]) 12/17/2018  . COPD exacerbation ([place]) 12/09/2018  . Iron deficiency anemia due to chronic blood loss 11/03/2018  . Obesity, Class II, BMI 35-39.9 09/23/2018  . Primary osteoarthritis of right knee 09/23/2018  . GERD (gastroesophageal reflux disease) 08/24/2018  . Hives 06/17/2018  . B12 deficiency 06/17/2018  . Anxiety and depression 03/23/2018  . Purpura ([place]) 08/06/2017  . Radicular pain in left arm 09/30/2016  . Cellulitis 09/26/2016  . Abscess 09/24/2016  . PAD (peripheral artery disease) ([place]) 08/13/2016  . Anemia 08/13/2016  . Urinary urgency 08/06/2016  . Chronic back pain 05/20/2016  . Neuropathy 10/05/2015  . Tremors of nervous system 06/18/2015  . GI bleed due to NSAIDs 05/04/2015  . Dysuria 03/07/2015  . Atrial fibrillation with RVR ([place]) 03/11/2014  . COPD (chronic obstructive pulmonary disease) ([place]) 03/11/2014  . Flexor hallucis longus tendinitis 11/09/2013  . Vertigo 09/16/2013  . Chronic steroid use 09/11/2013  . Osteoarthritis 09/11/2013  . Shortness of breath 08/18/2013  . Generalized weakness 08/18/2013  . Barrett's esophagus 08/18/2013  . Chronic diarrhea 08/18/2013  . Numbness and tingling 08/18/2013  . Screening for breast cancer 08/18/2013  . Rheumatoid arthritis ([place]) 05/31/2013  . Seborrheic keratosis 12/14/2012  . Benign neoplasm of colon 11/23/2012  . Family history of  malignant neoplasm of gastrointestinal tract 11/23/2012  . Prediabetes 12/16/2011     Past Surgical History:  Procedure Laterality Date  . ABDOMINAL HYSTERECTOMY  1992  . APPENDECTOMY    . CARDIAC ELECTROPHYSIOLOGY STUDY AND ABLATION  2013  . CHOLECYSTECTOMY  1987  . COLONOSCOPY N/A 05/08/2015   Procedure: COLONOSCOPY;  Surgeon: Manya Silvas, MD;  Location: [facility] ENDOSCOPY;   Service: Endoscopy;  Laterality: N/A;  . ESOPHAGOGASTRODUODENOSCOPY N/A 05/06/2015   Procedure: ESOPHAGOGASTRODUODENOSCOPY (EGD);  Surgeon: Lollie Sails, MD;  Location: [facility] ENDOSCOPY;  Service: Endoscopy;  Laterality: N/A;  . LAPAROSCOPIC APPENDECTOMY N/A 02/19/2019   Procedure: APPENDECTOMY LAPAROSCOPIC converted to open appendectomy;  Surgeon: Herbert Pun, MD;  Location: [facility] ORS;  Service: General;  Laterality: N/A;  . OOPHORECTOMY    . oophrectomy Bilateral 1992     Prior to Admission medications   Medication Sig Start Date End Date Taking? Authorizing Provider  albuterol (PROVENTIL HFA;VENTOLIN HFA) 108 (90 Base) MCG/ACT inhaler Inhale 1-2 puffs into the lungs every 4 (four) hours as needed for wheezing or shortness of breath. 01/01/18   Wilhelmina Mcardle, MD  bismuth subsalicylate (PEPTO-BISMOL) 262 MG chewable tablet Chew 2 tablets (524 mg total) by mouth 3 (three) times daily as needed for indigestion or diarrhea or loose stools. 04/13/19   Guse, Jacquelynn Cree, FNP  busPIRone (BUSPAR) 7.5 MG tablet Take 1 tablet (7.5 mg total) by mouth 2 (two) times daily for 30 days. 06/23/19 07/23/19  [name], FNP  clonazePAM (KLONOPIN) 0.5 MG tablet Take 1/2 tablet daily if needed for anxiety. Patient taking differently: 0.25 mg 2 (two) times daily as needed. Take 1/2 tablet daily if needed for anxiety. 06/22/19   Crecencio Mc, MD  ELIQUIS 5 MG TABS tablet Take 1 tablet (5 mg total) by mouth 2 (two) times daily. 06/24/19   Minna Merritts, MD  esomeprazole (NEXIUM) 20 MG capsule Take 1 capsule (20 mg total) by mouth daily. 03/29/19   [name], FNP  furosemide (LASIX) 20 MG tablet Take 1 tablet (20 mg) by mouth daily, you may take 1 extra tablet (20 mg) after lunch as needed for increased swelling, weight gain, shortness of breath 04/19/19   Minna Merritts, MD  gabapentin (NEURONTIN) 600 MG tablet TAKE 1 TABLET BY MOUTH THREE TIMES A DAY 04/26/19   [name], FNP   HUMIRA PEN 40 MG/0.4ML PNKT Inject 40 mg as directed. Every 2 weeks 07/07/18   [provider]  hydroxychloroquine (PLAQUENIL) 200 MG tablet Take 200 mg by mouth 2 (two) times daily.  06/21/16   [provider]  hydrOXYzine (ATARAX/VISTARIL) 25 MG tablet Take 25 mg by mouth at bedtime as needed.    [provider]  lidocaine (XYLOCAINE) 5 % ointment Apply 1 application topically as needed.    [provider]  potassium chloride (K-DUR) 10 MEQ tablet Take 1 tablet (10 meq) by mouth once daily, take 1 extra tablet (10 meq) after lunch when taking extra lasix 04/19/19   Minna Merritts, MD  propranolol ER (INDERAL LA) 120 MG 24 hr capsule Take 1 capsule (120 mg total) by mouth daily. 05/27/19   Dunn, Areta Haber, PA-C  traMADol (ULTRAM) 50 MG tablet Take 1 tablet (50 mg total) by mouth every 12 (twelve) hours as needed for severe pain. 12/15/18   [name], FNP  TRELEGY ELLIPTA 100-62.5-25 MCG/INH AEPB INHALE 1 PUFF INTO THE LUNGS DAILY 05/25/19   Wilhelmina Mcardle, MD  Allergies Latex   Family History  Problem Relation Age of Onset  . Depression Mother   . Pancreatic cancer Mother   . Colon cancer Father   . Breast cancer Sister 68  . Diabetes Brother   . Breast cancer Cousin   . Neuropathy Neg Hx     Social History Social History   Tobacco Use  . Smoking status: Former Smoker    Packs/day: 1.00    Years: 40.00    Pack years: 40.00    Quit date: 12/16/2011    Years since quitting: 7.5  . Smokeless tobacco: Never Used  Substance Use Topics  . Alcohol use: Not Currently    Alcohol/week: 0.0 standard drinks    Comment: Occasionally has a drink  . Drug use: No    Review of Systems  Constitutional:   No fever or chills.  ENT:   No sore throat. No rhinorrhea. Cardiovascular:   Positive as above chest pain without syncope. Respiratory:   No dyspnea or cough. Gastrointestinal:   Negative for abdominal pain, vomiting and diarrhea.   Musculoskeletal:   Negative for focal pain or swelling All other systems reviewed and are negative except as documented above in ROS and HPI.  ____________________________________________   PHYSICAL EXAM:  VITAL SIGNS: ED Triage Vitals  Enc Vitals Group     BP 07/09/19 1321 (!) 141/91     Pulse Rate 07/09/19 1321 (!) 106     Resp 07/09/19 1321 16     Temp 07/09/19 1321 98.2 F (36.8 C)     Temp Source 07/09/19 1321 Oral     SpO2 07/09/19 1321 99 %     Weight 07/09/19 1323 200 lb (90.7 kg)     Height 07/09/19 1323 5\' 6"  (1.676 m)     Head Circumference --      Peak Flow --      Pain Score 07/09/19 1322 0     Pain Loc --      Pain Edu? --      Excl. in [place]? --     Vital signs reviewed, nursing assessments reviewed.   Constitutional:   Alert and oriented. Non-toxic appearance. Eyes:   Conjunctivae are normal. EOMI. PERRL. ENT      Head:   Normocephalic and atraumatic.      Nose:   No congestion/rhinnorhea.       Mouth/Throat:   MMM, no pharyngeal erythema. No peritonsillar mass.       Neck:   No meningismus. Full ROM. Hematological/Lymphatic/Immunilogical:   No cervical lymphadenopathy. Cardiovascular:   Irregularly irregular rhythm, rate of about 100.[name] Symmetric bilateral radial and DP pulses.  No murmurs. Cap refill less than 2 seconds. Respiratory:   Normal respiratory effort without tachypnea/retractions. Breath sounds are clear and equal bilaterally. No wheezes/rales/rhonchi. Gastrointestinal:   Soft and nontender. Non distended. There is no CVA tenderness.  No rebound, rigidity, or guarding.  Musculoskeletal:   Normal range of motion in all extremities. No joint effusions.  No lower extremity tenderness.  Trace bilateral peripheral edema.  Abundant varicose veins Neurologic:   Normal speech and language.  Motor grossly intact. No acute focal neurologic deficits are appreciated.  Skin:    Skin is warm, dry and intact. No rash noted.  No petechiae, purpura, or  bullae.  ____________________________________________    LABS (pertinent positives/negatives) (all labs ordered are listed, but only abnormal results are displayed) Labs Reviewed  CBC - Abnormal; Notable for the following components:  Result Value   Hemoglobin 10.7 (*)    MCH 23.8 (*)    MCHC 29.2 (*)    RDW 17.1 (*)    All other components within normal limits  BASIC METABOLIC PANEL  TROPONIN I (HIGH SENSITIVITY)  TROPONIN I (HIGH SENSITIVITY)   ____________________________________________   EKG  Interpreted by me Atrial fibrillation rate of 96, normal axis and intervals.  Poor R wave progression in anterior precordial leads, normal ST segments and T waves.  Nonischemic.  ____________________________________________    RADIOLOGY  Dg Chest 2 View  Result Date: 07/09/2019 CLINICAL DATA:  Chest tightness EXAM: CHEST - 2 VIEW COMPARISON:  March 01, 2019 FINDINGS: Mild cardiomegaly and aortic tortuosity, stable. Mild atelectasis in the left base. No other acute abnormalities are identified. IMPRESSION: No acute interval change. Electronically Signed   By: Dorise Bullion III M.D   On: 07/09/2019 14:28    ____________________________________________   PROCEDURES Procedures  ____________________________________________  DIFFERENTIAL DIAGNOSIS   Anxiety, GERD, non-STEMI, esophageal spasm  CLINICAL IMPRESSION / ASSESSMENT AND PLAN / ED COURSE  Medications ordered in the ED: Medications - No data to display  Pertinent labs & imaging results that were available during my care of the patient were reviewed by me and considered in my medical decision making (see chart for details).  Brandy Armstrong was evaluated in Emergency Department on 07/09/2019 for the symptoms described in the history of present illness. She was evaluated in the context of the global COVID-19 pandemic, which necessitated consideration that the patient might be at risk for infection with the SARS-CoV-2  virus that causes COVID-19. Institutional protocols and algorithms that pertain to the evaluation of patients at risk for COVID-19 are in a state of rapid change based on information released by regulatory bodies including the CDC and federal and state organizations. These policies and algorithms were followed during the patient's care in the ED.   Patient presents with atypical chest discomfort which does sound consistent with GERD, especially in the setting of her increased stressors lately and intermittent use of her Nexium.  EKG is unremarkable, atrial fibrillation which is rate controlled.  Other vital signs are unremarkable.  Doubt hyperthyroidism, unstable angina, dissection AAA pneumothorax pericarditis or PE.  Serial troponins are normal.  Other labs are unremarkable and she is stable for discharge home and outpatient follow-up.  Discussed with her the possibility of adding on a low-dose antihypertensive which she declines and would rather follow-up with her own doctor.  She also feels that as far as her anxiety management goes, she would rather follow-up with her doctor regarding adjustment of her medication regimen.  This sounds reasonable.      ____________________________________________   FINAL CLINICAL IMPRESSION(S) / ED DIAGNOSES    Final diagnoses:  Nonspecific chest pain  Anxiety     ED Discharge Orders    None      Portions of this note were generated with dragon dictation software. Dictation errors may occur despite best attempts at proofreading.   Carrie Mew, MD 07/09/19 [facility] Emerald

## 2019-07-09 NOTE — ED Triage Notes (Signed)
Pt to ED for HTN at home and chest tightness. Pt sent by PCP for evaluation. PT also reporting anxiety that she was recently put on Prozac for. Since starting medication pt has been having a burning sensation in her chest. Pain worsens when laying flat.

## 2019-07-09 NOTE — ED Notes (Addendum)
Pt assisted to use bedside toilet. Pt denies CP; pt states never had CP today; states PCP sent pt over d/t HTN.

## 2019-07-09 NOTE — Progress Notes (Signed)
This visit type was conducted due to national recommendations for restrictions regarding the COVID-19 pandemic (e.g. social distancing).  This format is felt to be most appropriate for this patient at this time.  All issues noted in this document were discussed and addressed.  No physical exam was performed (except for noted visual exam findings with Video Visits). Virtual Visit via Video Note  I connected with@  on 07/09/19 at 11:00 AM EDT by a video enabled telemedicine application and verified that I am speaking with the correct person using two identifiers.  Location patient: home Location provider:work  Persons participating in the virtual visit: patient, provider  I discussed the limitations of evaluation and management by telemedicine and the availability of in person appointments. The patient expressed understanding and agreed to proceed.  Interactive audio and video telecommunications were attempted between this provider and patient, however failed, due to patient having technical difficulties or patient did not have access to video capability.  We continued and completed visit with audio only.     HPI:  Complains of Increased anxiety, chest pain.   Doesn't feel prozac is working, started yesterday and took again early this morning. 2-3 hours after taking . Didn't take the atarax last night. Likes the buspar. This morning heard of family friend of death. Also worried about blood pressure which she feels contributory.    Complains of elevated blood pressure x one week.  148/105, 138/96 Most recent 144/101, HR 102 taking while on phone.  HR 90s Feels may be having chest pain while on phone.  feels weird sensation in breast. 'hot' feeling around breasts. No epigastric burning.  No associated leg swelling, numbness or tingling radiating to left arm or jaw, palpitations, dizziness, frequent headaches, changes in vision, or shortness of breath.   Last ekg showed prolonged QT 423/611.    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   H/o CHF- no leg swelling, sob. Weight is down.  Not on lasix and wander is needs to be.  On 120mg  propranolol.  Last visit with cardiology 05/27/19. Advised lasix daily.   ROS: See pertinent positives and negatives per HPI.  Past Medical History:  Diagnosis Date  . Barretts esophagus   . Chest pain    a. 02/2014 Myoview: Ef 50%, no ischemia.  . Colon polyps   . COPD (chronic obstructive pulmonary disease) ([place])   . Depression with anxiety   . GERD (gastroesophageal reflux disease)   . Neuropathy   . Permanent atrial fibrillation    a. s/p ablation 01/2012 in [facility] by Dr. Boyd Kerbs;  b. On sotalol & Xarelto;  c. 02/2014 Echo: EF 50-55%, mild conc LVH, nl LA size/structure;  d. Recurrent afib 8/15 & 08/29/2014.  [name] Rectal fistula   . Rheumatoid arthritis ([place])    On methotrexate and orencia  . Urinary incontinence   . Vitamin D deficiency     Past Surgical History:  Procedure Laterality Date  . ABDOMINAL HYSTERECTOMY  1992  . CARDIAC ELECTROPHYSIOLOGY STUDY AND ABLATION  2013  . CHOLECYSTECTOMY  1987  . COLONOSCOPY N/A 05/08/2015   Procedure: COLONOSCOPY;  Surgeon: Manya Silvas, MD;  Location: [facility] ENDOSCOPY;  Service: Endoscopy;  Laterality: N/A;  . ESOPHAGOGASTRODUODENOSCOPY N/A 05/06/2015   Procedure: ESOPHAGOGASTRODUODENOSCOPY (EGD);  Surgeon: Lollie Sails, MD;  Location: [facility] ENDOSCOPY;  Service: Endoscopy;  Laterality: N/A;  . LAPAROSCOPIC APPENDECTOMY N/A 02/19/2019   Procedure: APPENDECTOMY LAPAROSCOPIC converted to open appendectomy;  Surgeon: Herbert Pun, MD;  Location: [facility]  ORS;  Service: General;  Laterality: N/A;  . OOPHORECTOMY    . oophrectomy Bilateral 1992    Family History  Problem Relation Age of Onset  . Depression Mother   . Pancreatic cancer Mother   . Colon cancer Father   . Breast cancer Sister 108  . Diabetes Brother   . Breast cancer Cousin   . Neuropathy Neg Hx      SOCIAL HX: former smoker   Current Outpatient Medications:  .  albuterol (PROVENTIL HFA;VENTOLIN HFA) 108 (90 Base) MCG/ACT inhaler, Inhale 1-2 puffs into the lungs every 4 (four) hours as needed for wheezing or shortness of breath., Disp: 1 Inhaler, Rfl: 10 .  bismuth subsalicylate (PEPTO-BISMOL) 262 MG chewable tablet, Chew 2 tablets (524 mg total) by mouth 3 (three) times daily as needed for indigestion or diarrhea or loose stools., Disp: 30 tablet, Rfl: 0 .  busPIRone (BUSPAR) 7.5 MG tablet, Take 1 tablet (7.5 mg total) by mouth 2 (two) times daily for 30 days., Disp: 60 tablet, Rfl: 2 .  clonazePAM (KLONOPIN) 0.5 MG tablet, Take 1/2 tablet daily if needed for anxiety. (Patient taking differently: 0.25 mg 2 (two) times daily as needed. Take 1/2 tablet daily if needed for anxiety.), Disp: 15 tablet, Rfl: 0 .  ELIQUIS 5 MG TABS tablet, Take 1 tablet (5 mg total) by mouth 2 (two) times daily., Disp: 180 tablet, Rfl: 3 .  esomeprazole (NEXIUM) 20 MG capsule, Take 1 capsule (20 mg total) by mouth daily., Disp: 90 capsule, Rfl: 1 .  furosemide (LASIX) 20 MG tablet, Take 1 tablet (20 mg) by mouth daily, you may take 1 extra tablet (20 mg) after lunch as needed for increased swelling, weight gain, shortness of breath, Disp: 90 tablet, Rfl: 2 .  gabapentin (NEURONTIN) 600 MG tablet, TAKE 1 TABLET BY MOUTH THREE TIMES A DAY, Disp: 270 tablet, Rfl: 1 .  HUMIRA PEN 40 MG/0.4ML PNKT, Inject 40 mg as directed. Every 2 weeks, Disp: , Rfl:  .  hydroxychloroquine (PLAQUENIL) 200 MG tablet, Take 200 mg by mouth 2 (two) times daily. , Disp: , Rfl:  .  hydrOXYzine (ATARAX/VISTARIL) 25 MG tablet, Take 25 mg by mouth at bedtime as needed., Disp: , Rfl:  .  lidocaine (XYLOCAINE) 5 % ointment, Apply 1 application topically as needed., Disp: , Rfl:  .  potassium chloride (K-DUR) 10 MEQ tablet, Take 1 tablet (10 meq) by mouth once daily, take 1 extra tablet (10 meq) after lunch when taking extra lasix, Disp: 90  tablet, Rfl: 2 .  propranolol ER (INDERAL LA) 120 MG 24 hr capsule, Take 1 capsule (120 mg total) by mouth daily., Disp: 90 capsule, Rfl: 3 .  traMADol (ULTRAM) 50 MG tablet, Take 1 tablet (50 mg total) by mouth every 12 (twelve) hours as needed for severe pain., Disp: 30 tablet, Rfl: 0 .  TRELEGY ELLIPTA 100-62.5-25 MCG/INH AEPB, INHALE 1 PUFF INTO THE LUNGS DAILY, Disp: 60 each, Rfl: 2  EXAM:  VITALS per patient if applicable: BP Readings from Last 3 Encounters:  07/09/19 (!) 148/105  07/05/19 128/84  06/28/19 135/90      ASSESSMENT AND PLAN:  Discussed the following assessment and plan:  Problem List Items Addressed This Visit      Other   Anxiety    Worsened. Concern for chest pain and potential for QT prolongation on low dose prozac. Dc'ed. May increase buspar at later date. Advised ED.       Chest pain  Concern for cp, elevated blood pressure,  H/o QT prolongation after being on Prozac ( now dced). Agreeable to being seen in ED.   Declines EMS transport.  Advised against driving herself and she is adament that she will drive herself. At time of this note, patient is in ED now.             I discussed the assessment and treatment plan with the patient. The patient was provided an opportunity to ask questions and all were answered. The patient agreed with the plan and demonstrated an understanding of the instructions.   The patient was advised to call back or seek an in-person evaluation if the symptoms worsen or if the condition fails to improve as anticipated.   Mable Paris, FNP   I spent 25 min non face to face w/ pt.

## 2019-07-09 NOTE — ED Notes (Signed)
Pt up with Jinny Blossom RN to use restroom.

## 2019-07-09 NOTE — ED Notes (Signed)
Pt being attached to cardiac monitoring but pt needed to have BM again. Pt back on toilet. Pt agreeable to monitoring but refused earlier even post education.

## 2019-07-09 NOTE — Discharge Instructions (Signed)
Your blood test today were all okay.  Please follow-up with your doctor to continue evaluating your symptoms and adjusting your medications as needed.

## 2019-07-09 NOTE — ED Triage Notes (Signed)
FIRST NURSE NOTE-pt reports bp 150/100 at home. NAD. Ambulatory.

## 2019-07-09 NOTE — Assessment & Plan Note (Signed)
Worsened. Concern for chest pain and potential for QT prolongation on low dose prozac. Dc'ed. May increase buspar at later date. Advised ED.

## 2019-07-09 NOTE — ED Notes (Signed)
Introduced self to pt. Explained delay.

## 2019-07-09 NOTE — Assessment & Plan Note (Addendum)
Concern for cp, elevated blood pressure,  H/o QT prolongation after being on Prozac ( now dced). Agreeable to being seen in ED.   Declines EMS transport.  Advised against driving herself and she is adament that she will drive herself. At time of this note, patient is in ED now.

## 2019-07-09 NOTE — Patient Instructions (Addendum)
STOP Prozac. Advised to go to emergency room, [facility].   Please stay safe.   Close follow up with me

## 2019-07-11 ENCOUNTER — Other Ambulatory Visit: Payer: Self-pay | Admitting: Family

## 2019-07-11 DIAGNOSIS — G629 Polyneuropathy, unspecified: Secondary | ICD-10-CM

## 2019-07-12 ENCOUNTER — Other Ambulatory Visit: Payer: Self-pay

## 2019-07-12 ENCOUNTER — Inpatient Hospital Stay: Payer: Medicare Other

## 2019-07-12 VITALS — BP 114/78 | HR 94 | Resp 19

## 2019-07-12 DIAGNOSIS — D509 Iron deficiency anemia, unspecified: Secondary | ICD-10-CM | POA: Diagnosis not present

## 2019-07-12 DIAGNOSIS — D500 Iron deficiency anemia secondary to blood loss (chronic): Secondary | ICD-10-CM

## 2019-07-12 MED ORDER — SODIUM CHLORIDE 0.9 % IV SOLN
Freq: Once | INTRAVENOUS | Status: AC
  Administered 2019-07-12: 14:00:00 via INTRAVENOUS
  Filled 2019-07-12: qty 250

## 2019-07-12 MED ORDER — IRON SUCROSE 20 MG/ML IV SOLN
200.0000 mg | Freq: Once | INTRAVENOUS | Status: AC
  Administered 2019-07-12: 200 mg via INTRAVENOUS
  Filled 2019-07-12: qty 10

## 2019-07-15 ENCOUNTER — Ambulatory Visit: Payer: Medicare Other

## 2019-07-16 ENCOUNTER — Encounter: Payer: Self-pay | Admitting: Family

## 2019-07-16 ENCOUNTER — Other Ambulatory Visit: Payer: Self-pay | Admitting: Family

## 2019-07-16 ENCOUNTER — Ambulatory Visit (INDEPENDENT_AMBULATORY_CARE_PROVIDER_SITE_OTHER): Payer: Medicare Other | Admitting: Family

## 2019-07-16 ENCOUNTER — Other Ambulatory Visit: Payer: Self-pay

## 2019-07-16 VITALS — Ht 65.98 in | Wt 200.0 lb

## 2019-07-16 DIAGNOSIS — F419 Anxiety disorder, unspecified: Secondary | ICD-10-CM

## 2019-07-16 MED ORDER — HYDROXYZINE HCL 50 MG PO TABS: 50.0000 mg | ORAL_TABLET | Freq: Every evening | ORAL | 2 refills | Status: DC | PRN

## 2019-07-16 MED ORDER — BUSPIRONE HCL 10 MG PO TABS: 10.0000 mg | ORAL_TABLET | Freq: Three times a day (TID) | ORAL | 2 refills | Status: DC

## 2019-07-16 NOTE — Assessment & Plan Note (Signed)
Uncontrolled.  Will increase BuSpar and explained very carefully how to slowly increase this medication so she does not exceed approximately 5 mg increase every 3 to 4 days.  She already self increased at home to 7.5 mg to 3 times daily. We will also increase Atarax to 50 mg.  Strongly advised her to continue using Klonopin only as needed.  Emphasized this medication is not scheduled.  We discussed at length polypharmacy and sedating nature of her medications.  She does not feel overly sedated at this time.  She will remain vigilant for this.  Pending counseling appointment.  Also placed a referral to psychiatry for further evaluation.

## 2019-07-16 NOTE — Progress Notes (Signed)
Patient c/o still having a lot of anxiety.  Pt wants to discuss her medications.  Needs Rx for hydroxyzine.

## 2019-07-16 NOTE — Progress Notes (Signed)
This visit type was conducted due to national recommendations for restrictions regarding the COVID-19 pandemic (e.g. social distancing).  This format is felt to be most appropriate for this patient at this time.  All issues noted in this document were discussed and addressed.  No physical exam was performed (except for noted visual exam findings with Video Visits). Virtual Visit via Video Note  I connected with@  on 07/16/19 at 11:00 AM EDT by a video enabled telemedicine application and verified that I am speaking with the correct person using two identifiers.  Location patient: home Location provider:work Persons participating in the virtual visit: patient, provider  I discussed the limitations of evaluation and management by telemedicine and the availability of in person appointments. The patient expressed understanding and agreed to proceed.   HPI:  CC: anxiety- continues to feel increased anxiety.  Feels earlier health scare, hospitalization earlier this year is largely contributory. No anxiety attacks. Just feels anxious all the time. Continues to feel warm feeling  Over body when anxious. No CP, SOB, palpitations.    Feels well on klonopin 0.25mg  BID prn from Dr Melrose Nakayama. She took Klonopin 0.25 mg only today and feels very much to ease at this time. Taking only when needed, not scheduled.   would like to increase buspar and atarax. Ran out of Atarax and would like refill today.  Does note that she was not really sleeping through the night and think she needs to increase the dose.   no alcohol use.   Seeing Alene Mires 07/27/19.   Taking buspar 7.5mg  TID   Using tramadol very rarely.   ED 07/09/19 - negative troponin x 2.   #3 Venofer infusion scheduled 07/19/19.   ROS: See pertinent positives and negatives per HPI.  Past Medical History:  Diagnosis Date  . Barretts esophagus   . Chest pain    a. 02/2014 Myoview: Ef 50%, no ischemia.  . Colon polyps   . COPD (chronic obstructive  pulmonary disease) ([place])   . Depression with anxiety   . GERD (gastroesophageal reflux disease)   . Neuropathy   . Permanent atrial fibrillation    a. s/p ablation 01/2012 in [facility] by Dr. Boyd Kerbs;  b. On sotalol & Xarelto;  c. 02/2014 Echo: EF 50-55%, mild conc LVH, nl LA size/structure;  d. Recurrent afib 8/15 & 08/29/2014.  [name] Rectal fistula   . Rheumatoid arthritis ([place])    On methotrexate and orencia  . Urinary incontinence   . Vitamin D deficiency     Past Surgical History:  Procedure Laterality Date  . ABDOMINAL HYSTERECTOMY  1992  . APPENDECTOMY    . CARDIAC ELECTROPHYSIOLOGY STUDY AND ABLATION  2013  . CHOLECYSTECTOMY  1987  . COLONOSCOPY N/A 05/08/2015   Procedure: COLONOSCOPY;  Surgeon: Manya Silvas, MD;  Location: [facility] ENDOSCOPY;  Service: Endoscopy;  Laterality: N/A;  . ESOPHAGOGASTRODUODENOSCOPY N/A 05/06/2015   Procedure: ESOPHAGOGASTRODUODENOSCOPY (EGD);  Surgeon: Lollie Sails, MD;  Location: [facility] ENDOSCOPY;  Service: Endoscopy;  Laterality: N/A;  . LAPAROSCOPIC APPENDECTOMY N/A 02/19/2019   Procedure: APPENDECTOMY LAPAROSCOPIC converted to open appendectomy;  Surgeon: Herbert Pun, MD;  Location: [facility] ORS;  Service: General;  Laterality: N/A;  . OOPHORECTOMY    . oophrectomy Bilateral 1992    Family History  Problem Relation Age of Onset  . Depression Mother   . Pancreatic cancer Mother   . Colon cancer Father   . Breast cancer Sister 44  . Diabetes Brother   . Breast cancer Cousin   .  Neuropathy Neg Hx     SOCIAL HX: former smoker.    Current Outpatient Medications:  .  albuterol (PROVENTIL HFA;VENTOLIN HFA) 108 (90 Base) MCG/ACT inhaler, Inhale 1-2 puffs into the lungs every 4 (four) hours as needed for wheezing or shortness of breath., Disp: 1 Inhaler, Rfl: 10 .  bismuth subsalicylate (PEPTO-BISMOL) 262 MG chewable tablet, Chew 2 tablets (524 mg total) by mouth 3 (three) times daily as needed for indigestion or diarrhea or loose stools.,  Disp: 30 tablet, Rfl: 0 .  busPIRone (BUSPAR) 10 MG tablet, Take 1 tablet (10 mg total) by mouth 3 (three) times daily. Take morning, midday and early afternoon., Disp: 60 tablet, Rfl: 2 .  clonazePAM (KLONOPIN) 0.5 MG tablet, Take 1/2 tablet daily if needed for anxiety. (Patient taking differently: 0.25 mg 2 (two) times daily as needed. Take 1/2 tablet daily if needed for anxiety.), Disp: 15 tablet, Rfl: 0 .  ELIQUIS 5 MG TABS tablet, Take 1 tablet (5 mg total) by mouth 2 (two) times daily., Disp: 180 tablet, Rfl: 3 .  esomeprazole (NEXIUM) 20 MG capsule, Take 1 capsule (20 mg total) by mouth daily., Disp: 90 capsule, Rfl: 1 .  furosemide (LASIX) 20 MG tablet, Take 1 tablet (20 mg) by mouth daily, you may take 1 extra tablet (20 mg) after lunch as needed for increased swelling, weight gain, shortness of breath, Disp: 90 tablet, Rfl: 2 .  gabapentin (NEURONTIN) 600 MG tablet, TAKE 1 TABLET BY MOUTH THREE TIMES A DAY, Disp: 90 tablet, Rfl: 1 .  HUMIRA PEN 40 MG/0.4ML PNKT, Inject 40 mg as directed. Every 2 weeks, Disp: , Rfl:  .  hydroxychloroquine (PLAQUENIL) 200 MG tablet, Take 200 mg by mouth 2 (two) times daily. , Disp: , Rfl:  .  hydrOXYzine (ATARAX/VISTARIL) 50 MG tablet, Take 1 tablet (50 mg total) by mouth at bedtime as needed., Disp: 30 tablet, Rfl: 2 .  lidocaine (XYLOCAINE) 5 % ointment, Apply 1 application topically as needed., Disp: , Rfl:  .  potassium chloride (K-DUR) 10 MEQ tablet, Take 1 tablet (10 meq) by mouth once daily, take 1 extra tablet (10 meq) after lunch when taking extra lasix, Disp: 90 tablet, Rfl: 2 .  propranolol ER (INDERAL LA) 120 MG 24 hr capsule, Take 1 capsule (120 mg total) by mouth daily., Disp: 90 capsule, Rfl: 3 .  traMADol (ULTRAM) 50 MG tablet, Take 1 tablet (50 mg total) by mouth every 12 (twelve) hours as needed for severe pain., Disp: 30 tablet, Rfl: 0 .  TRELEGY ELLIPTA 100-62.5-25 MCG/INH AEPB, INHALE 1 PUFF INTO THE LUNGS DAILY, Disp: 60 each, Rfl:  2  EXAM:  VITALS per patient if applicable:  GENERAL: alert, oriented, appears well and in no acute distress  HEENT: atraumatic, conjunttiva clear, no obvious abnormalities on inspection of external nose and ears  NECK: normal movements of the head and neck  LUNGS: on inspection no signs of respiratory distress, breathing rate appears normal, no obvious gross SOB, gasping or wheezing  CV: no obvious cyanosis  MS: moves all visible extremities without noticeable abnormality  PSYCH/NEURO: pleasant and cooperative, no obvious depression or anxiety, speech and thought processing grossly intact  ASSESSMENT AND PLAN:  Discussed the following assessment and plan:  Problem List Items Addressed This Visit      Other   Anxiety - Primary    Uncontrolled.  Will increase BuSpar and explained very carefully how to slowly increase this medication so she does not exceed approximately 5 mg  increase every 3 to 4 days.  She already self increased at home to 7.5 mg to 3 times daily. We will also increase Atarax to 50 mg.  Strongly advised her to continue using Klonopin only as needed.  Emphasized this medication is not scheduled.  We discussed at length polypharmacy and sedating nature of her medications.  She does not feel overly sedated at this time.  She will remain vigilant for this.  Pending counseling appointment.  Also placed a referral to psychiatry for further evaluation.        Relevant Medications   busPIRone (BUSPAR) 10 MG tablet   hydrOXYzine (ATARAX/VISTARIL) 50 MG tablet   Other Relevant Orders   Ambulatory referral to Psychiatry      I discussed the assessment and treatment plan with the patient. The patient was provided an opportunity to ask questions and all were answered. The patient agreed with the plan and demonstrated an understanding of the instructions.   The patient was advised to call back or seek an in-person evaluation if the symptoms worsen or if the condition fails  to improve as anticipated.   Mable Paris, FNP

## 2019-07-16 NOTE — Patient Instructions (Addendum)
Increase up the buspar ( we will do that SLOWLY).   Today start taking 7.5mg  morning, 10mg  midday, 10mg  late afternoon.   Then after 3-4 days, may increase to dose where you will stay: 10mg  three times per day.   We have to slowly increase buspar.   Increase atarax to 50mg  at bedtime as needed.   Today we discussed referrals, orders. Psychiatry ( Dr Cephus Shelling)    I have placed these orders in the system for you.  Please be sure to give [place] a call if you have not heard from our office regarding this. We should hear from [place] within ONE week with information regarding your appointment. If not, please let me know immediately.   Let me know how you are doing

## 2019-07-19 ENCOUNTER — Other Ambulatory Visit: Payer: Self-pay

## 2019-07-19 ENCOUNTER — Inpatient Hospital Stay: Payer: Medicare Other

## 2019-07-19 VITALS — BP 102/72 | HR 93 | Temp 98.8°F | Resp 20

## 2019-07-19 DIAGNOSIS — D509 Iron deficiency anemia, unspecified: Secondary | ICD-10-CM | POA: Diagnosis not present

## 2019-07-19 DIAGNOSIS — D500 Iron deficiency anemia secondary to blood loss (chronic): Secondary | ICD-10-CM

## 2019-07-19 MED ORDER — IRON SUCROSE 20 MG/ML IV SOLN
200.0000 mg | Freq: Once | INTRAVENOUS | Status: AC
  Administered 2019-07-19: 200 mg via INTRAVENOUS
  Filled 2019-07-19: qty 10

## 2019-07-19 MED ORDER — SODIUM CHLORIDE 0.9 % IV SOLN
Freq: Once | INTRAVENOUS | Status: AC
  Administered 2019-07-19: 14:00:00 via INTRAVENOUS
  Filled 2019-07-19: qty 250

## 2019-07-20 ENCOUNTER — Ambulatory Visit (INDEPENDENT_AMBULATORY_CARE_PROVIDER_SITE_OTHER): Payer: Medicare Other

## 2019-07-20 DIAGNOSIS — Z0000 Encounter for general adult medical examination without abnormal findings: Secondary | ICD-10-CM

## 2019-07-20 NOTE — Progress Notes (Signed)
Subjective:   Brandy Armstrong is a 73 y.o. female who presents for Medicare Annual (Subsequent) preventive examination.  Review of Systems:  No ROS.  Medicare Wellness Virtual Visit.  Visual/audio telehealth visit, UTA vital signs.   See social history for additional risk factors.   Cardiac Risk Factors include: advanced age (>56men, >38 women)     Objective:     Vitals: There were no vitals taken for this visit.  There is no height or weight on file to calculate BMI.  Advanced Directives 07/20/2019 07/09/2019 06/28/2019 03/03/2019 03/02/2019 02/24/2019 01/08/2019  Does Patient Have a Medical Advance Directive? Yes Yes Yes - - Yes Yes  Type of Advance Directive [place];Living will [place];Living will Living will;Healthcare Power of Attorney Living will Living will Healthcare Power of [place];Living will  Does patient want to make changes to medical advance directive? No - Patient declined No - Patient declined - - No - Patient declined - -  Copy of [place] in Chart? Yes - validated most recent copy scanned in chart (See row information) No - copy requested No - copy requested No - copy requested - Yes - validated most recent copy scanned in chart (See row information) No - copy requested  Would patient like information on creating a medical advance directive? - - - - - - -    Tobacco Social History   Tobacco Use  Smoking Status Former Smoker  . Packs/day: 1.00  . Years: 40.00  . Pack years: 40.00  . Quit date: 12/16/2011  . Years since quitting: 7.5  Smokeless Tobacco Never Used     Counseling given: Not Answered   Clinical Intake:  Pre-visit preparation completed: Yes        Diabetes: No  How often do you need to have someone help you when you read instructions, pamphlets, or other written materials from your doctor or pharmacy?: 1 - Never  Interpreter Needed?: No     Past  Medical History:  Diagnosis Date  . Barretts esophagus   . Chest pain    a. 02/2014 Myoview: Ef 50%, no ischemia.  . Colon polyps   . COPD (chronic obstructive pulmonary disease) ([place])   . Depression with anxiety   . GERD (gastroesophageal reflux disease)   . Neuropathy   . Permanent atrial fibrillation    a. s/p ablation 01/2012 in [facility] by Dr. Boyd Kerbs;  b. On sotalol & Xarelto;  c. 02/2014 Echo: EF 50-55%, mild conc LVH, nl LA size/structure;  d. Recurrent afib 8/15 & 08/29/2014.  [name] Rectal fistula   . Rheumatoid arthritis ([place])    On methotrexate and orencia  . Urinary incontinence   . Vitamin D deficiency    Past Surgical History:  Procedure Laterality Date  . ABDOMINAL HYSTERECTOMY  1992  . APPENDECTOMY    . CARDIAC ELECTROPHYSIOLOGY STUDY AND ABLATION  2013  . CHOLECYSTECTOMY  1987  . COLONOSCOPY N/A 05/08/2015   Procedure: COLONOSCOPY;  Surgeon: Manya Silvas, MD;  Location: [facility] ENDOSCOPY;  Service: Endoscopy;  Laterality: N/A;  . ESOPHAGOGASTRODUODENOSCOPY N/A 05/06/2015   Procedure: ESOPHAGOGASTRODUODENOSCOPY (EGD);  Surgeon: Lollie Sails, MD;  Location: [facility] ENDOSCOPY;  Service: Endoscopy;  Laterality: N/A;  . LAPAROSCOPIC APPENDECTOMY N/A 02/19/2019   Procedure: APPENDECTOMY LAPAROSCOPIC converted to open appendectomy;  Surgeon: Herbert Pun, MD;  Location: [facility] ORS;  Service: General;  Laterality: N/A;  . OOPHORECTOMY    . oophrectomy Bilateral  1992   Family History  Problem Relation Age of Onset  . Depression Mother   . Pancreatic cancer Mother   . Colon cancer Father   . Breast cancer Sister 61  . Diabetes Brother   . Breast cancer Cousin   . Neuropathy Neg Hx    Social History   Socioeconomic History  . Marital status: Single    Spouse name: Not on file  . Number of children: 0  . Years of education: 23  . Highest education level: Not on file  Occupational History  . Occupation: Retired  Scientific laboratory technician  . Financial resource strain: Not  hard at all  . Food insecurity    Worry: Never true    Inability: Never true  . Transportation needs    Medical: No    Non-medical: No  Tobacco Use  . Smoking status: Former Smoker    Packs/day: 1.00    Years: 40.00    Pack years: 40.00    Quit date: 12/16/2011    Years since quitting: 7.5  . Smokeless tobacco: Never Used  Substance and Sexual Activity  . Alcohol use: Not Currently    Alcohol/week: 0.0 standard drinks    Comment: Occasionally has a drink  . Drug use: No  . Sexual activity: Never  Lifestyle  . Physical activity    Days per week: 7 days    Minutes per session: 20 min  . Stress: Only a little  Relationships  . Social Herbalist on phone: Not on file    Gets together: Not on file    Attends religious service: Not on file    Active member of club or organization: Not on file    Attends meetings of clubs or organizations: Not on file    Relationship status: Not on file  Other Topics Concern  . Not on file  Social History Narrative   Brandy Armstrong was born and reared in [place]. She attended [employer] and graduated in 1971 with her [employer] in Education. 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 She then worked for a [employer] in [place]. She is single. She is currently retired. She loves reading. She loves to spend time with her dog.       Caffeine use: 2 cups coffee per day   2 glasses iced tea/ day   Right-handed    Outpatient Encounter Medications as of 07/20/2019  Medication Sig  . albuterol (PROVENTIL HFA;VENTOLIN HFA) 108 (90 Base) MCG/ACT inhaler Inhale 1-2 puffs into the lungs every 4 (four) hours as needed for wheezing or shortness of breath.  . bismuth subsalicylate (PEPTO-BISMOL) 262 MG chewable tablet Chew 2 tablets (524 mg total) by mouth 3 (three) times daily as needed for indigestion or diarrhea or loose stools.  . busPIRone (BUSPAR) 10 MG tablet Take 1 tablet (10 mg  total) by mouth 3 (three) times daily. Take morning, midday and early afternoon.  . clonazePAM (KLONOPIN) 0.5 MG tablet Take 1/2 tablet daily if needed for anxiety. (Patient taking differently: 0.25 mg 2 (two) times daily as needed. Take 1/2 tablet daily if needed for anxiety.)  . ELIQUIS 5 MG TABS tablet Take 1 tablet (5 mg total) by mouth 2 (two) times daily.  [name] esomeprazole (NEXIUM) 20 MG capsule Take 1 capsule (20 mg total) by mouth daily.  . furosemide (LASIX) 20 MG tablet Take 1 tablet (20 mg) by mouth daily, you  may take 1 extra tablet (20 mg) after lunch as needed for increased swelling, weight gain, shortness of breath  . gabapentin (NEURONTIN) 600 MG tablet TAKE 1 TABLET BY MOUTH THREE TIMES A DAY  . HUMIRA PEN 40 MG/0.4ML PNKT Inject 40 mg as directed. Every 2 weeks  . hydroxychloroquine (PLAQUENIL) 200 MG tablet Take 200 mg by mouth 2 (two) times daily.   . hydrOXYzine (ATARAX/VISTARIL) 50 MG tablet Take 1 tablet (50 mg total) by mouth at bedtime as needed.  . lidocaine (XYLOCAINE) 5 % ointment Apply 1 application topically as needed.  . potassium chloride (K-DUR) 10 MEQ tablet Take 1 tablet (10 meq) by mouth once daily, take 1 extra tablet (10 meq) after lunch when taking extra lasix  . propranolol ER (INDERAL LA) 120 MG 24 hr capsule Take 1 capsule (120 mg total) by mouth daily.  . traMADol (ULTRAM) 50 MG tablet Take 1 tablet (50 mg total) by mouth every 12 (twelve) hours as needed for severe pain.  . TRELEGY ELLIPTA 100-62.5-25 MCG/INH AEPB INHALE 1 PUFF INTO THE LUNGS DAILY   No facility-administered encounter medications on file as of 07/20/2019.     Activities of Daily Living In your present state of health, do you have any difficulty performing the following activities: 07/20/2019 03/03/2019  Hearing? N -  Vision? N -  Difficulty concentrating or making decisions? N -  Walking or climbing stairs? Y -  Comment Unsteady gait. Walker in use. -  Dressing or bathing? N -  Doing  errands, shopping? N N  Preparing Food and eating ? N -  Using the Toilet? N -  In the past six months, have you accidently leaked urine? N -  Do you have problems with loss of bowel control? N -  Managing your Medications? N -  Managing your Finances? N -  Housekeeping or managing your Housekeeping? N -  Some recent data might be hidden    Patient Care Team: [name], FNP as PCP - General (Family Medicine) Minna Merritts, MD as PCP - Cardiology (Cardiology)    Assessment:   This is a routine wellness examination for Brandy Armstrong.  I connected with patient 07/20/19 at 11:30 AM EDT by a video/audio enabled telemedicine application and verified that I am speaking with the correct person using two identifiers. Patient stated full name and DOB. Patient gave permission to continue with virtual visit. Patient's location was at home and Nurse's location was at [place] office.   Appointment scheduled with pcp for anxiety and medication management 7/22.   Health Screenings  Mammogram - 04/2018 Colonoscopy - 04/2015 Bone Density - 12/2014 Glaucoma -none Hearing -demonstrates normal hearing during visit. Labs followed by pcp Dental- dentures Vision- visits within the last 12 months.  Social  Alcohol intake - not currently     Smoking history- former    Smokers in home? none Illicit drug use? none Exercise - walking daily with her dog Diet - regular. Low carb diet.  Sexually Active -never BMI- discussed the importance of a healthy diet, water intake and the benefits of aerobic exercise.  Educational material provided.   Safety  Patient feels safe at home- yes Patient does have smoke detectors at home- yes Patient does wear sunscreen or protective clothing when in direct sunlight -yes Patient does wear seat belt when in a moving vehicle -yes  Covid-19 precautions and sickness symptoms discussed.   Activities of Daily Living Patient denies needing assistance with: driving,  household chores,  feeding themselves, getting from bed to chair, getting to the toilet, bathing/showering, dressing, managing money, or preparing meals.   Depression Screen Patient denies losing interest in daily life, feeling hopeless, or crying easily over simple problems. States she does not have depression but she does have anxiety and is awaiting an appointment with psychiatrist. Followed by pcp.  Medication-uncomfortable taking new dose of Buspar. C/O hot and flushed feeling in her stomach since starting new dose several days ago. Denies any other symptoms. Taking klonopin as well prn and wonders if she is taking them too close together. States she is trying to manage her anxiety. Eating and drinking without issues. Declines urgent care/ER visit today. Appointment scheduled tomorrow with pcp for medication management. Directed to ER or urgent care if symptoms worsen or new symptoms present which make her feel uncomfortable.    Taking all other scheduled medications without issues.   Fall Screen Patient denies being afraid of falling or falling in the last year. Walker in use when ambulating.   Memory Screen Patient is alert.  Correctly identified the president of the [place], season and recall.  Immunizations The following Immunizations were discussed: Influenza, shingles, pneumonia, and tetanus.   Other Providers Patient Care Team: [name], FNP as PCP - General (Family Medicine) Minna Merritts, MD as PCP - Cardiology (Cardiology)  Exercise Activities and Dietary recommendations Current Exercise Habits: Home exercise routine, Type of exercise: walking, Time (Minutes): 20, Frequency (Times/Week): 5, Weekly Exercise (Minutes/Week): 100, Intensity: Mild  Goals      Patient Stated   . Follow up with Primary Care Provider (pt-stated)     Medication Management        Fall Risk Fall Risk  07/20/2019 06/15/2018 01/28/2017 08/01/2016 07/08/2016  Falls in the past year? 0 No No  Yes Yes  Number falls in past yr: - - - 1 1  Comment - - - Tripped on the corner of a desk and fell.  No injury. -  Injury with Fall? - - - - Yes  Follow up - - - Falls prevention discussed;Education provided -   Is the patient's home free of loose throw rugs in walkways, pet beds, electrical cords, etc? yes      Grab bars in the bathroom? yes      Handrails on the stairs? yes        Adequate lighting? yes   Depression Screen PHQ 2/9 Scores 07/16/2019 12/09/2018 01/28/2017 08/01/2016  PHQ - 2 Score 0 6 0 0  PHQ- 9 Score - 21 - -     Cognitive Function MMSE - Mini Mental State Exam 08/01/2016  Orientation to time 5  Orientation to Place 5  Registration 3  Attention/ Calculation 5  Recall 3  Language- name 2 objects 2  Language- repeat 1  Language- follow 3 step command 3  Language- read & follow direction 1  Write a sentence 1  Copy design 1  Total score 30     6CIT Screen 07/20/2019  What Year? 0 points  What month? 0 points  What time? 0 points  Count back from 20 0 points  Months in reverse 0 points  Repeat phrase 0 points  Total Score 0    Immunization History  Administered Date(s) Administered  . Influenza Split 10/24/2013  . Influenza, High Dose Seasonal PF 09/24/2016, 12/05/2017, 09/03/2018, 09/03/2018  . Influenza, Seasonal, Injecte, Preservative Fre 12/19/2011  . Influenza,inj,Quad PF,6+ Mos 09/13/2013, 10/20/2015  . Influenza-Unspecified 09/29/2014  . Pneumococcal  Conjugate-13 09/30/2018  . Pneumococcal Polysaccharide-23 04/15/2014  . Td 08/09/2014  . Tdap 08/15/2017   Screening Tests Health Maintenance  Topic Date Due  . URINE MICROALBUMIN  08/07/1956  . INFLUENZA VACCINE  07/31/2019  . MAMMOGRAM  05/29/2020  . COLONOSCOPY  05/07/2025  . TETANUS/TDAP  08/16/2027  . DEXA SCAN  Completed  . Hepatitis C Screening  Completed  . PNA vac Low Risk Adult  Completed     Plan:    End of life planning; Advance aging; Advanced directives discussed.  Copy of  current HCPOA/Living Will on file.    I have personally reviewed and noted the following in the patient's chart:   . Medical and social history . Use of alcohol, tobacco or illicit drugs  . Current medications and supplements . Functional ability and status . Nutritional status . Physical activity . Advanced directives . List of other physicians . Hospitalizations, surgeries, and ER visits in previous 12 months . Vitals . Screenings to include cognitive, depression, and falls . Referrals and appointments  In addition, I have reviewed and discussed with patient certain preventive protocols, quality metrics, and best practice recommendations. A written personalized care plan for preventive services as well as general preventive health recommendations were provided to patient.     Varney Biles, LPN  [id number]   Agree with plan. Mable Paris, NP

## 2019-07-20 NOTE — Patient Instructions (Addendum)
  Brandy Armstrong , Thank you for taking time to come for your Medicare Wellness Visit. I appreciate your ongoing commitment to your health goals. Please review the following plan we discussed and let me know if I can assist you in the future.   These are the goals we discussed: Goals      Patient Stated   . Follow up with Primary Care Provider (pt-stated)     Medication Management        This is a list of the screening recommended for you and due dates:  Health Maintenance  Topic Date Due  . Urine Protein Check  08[birth year]07/1956  . Flu Shot  07/31/2019  . Mammogram  05/29/2020  . Colon Cancer Screening  05/07/2025  . Tetanus Vaccine  08/16/2027  . DEXA scan (bone density measurement)  Completed  .  Hepatitis C: One time screening is recommended by Center for Disease Control  (CDC) for  adults born from [birth year] through 1963.   Completed  . Pneumonia vaccines  Completed

## 2019-07-21 ENCOUNTER — Encounter: Payer: Self-pay | Admitting: Family

## 2019-07-21 ENCOUNTER — Ambulatory Visit (INDEPENDENT_AMBULATORY_CARE_PROVIDER_SITE_OTHER): Payer: Medicare Other | Admitting: Family

## 2019-07-21 ENCOUNTER — Other Ambulatory Visit: Payer: Self-pay

## 2019-07-21 ENCOUNTER — Ambulatory Visit: Payer: Medicare Other | Admitting: Family

## 2019-07-21 DIAGNOSIS — F329 Major depressive disorder, single episode, unspecified: Secondary | ICD-10-CM

## 2019-07-21 DIAGNOSIS — F419 Anxiety disorder, unspecified: Secondary | ICD-10-CM | POA: Diagnosis not present

## 2019-07-21 MED ORDER — BUSPIRONE HCL 7.5 MG PO TABS: 7.5000 mg | ORAL_TABLET | Freq: Two times a day (BID) | ORAL | 2 refills | Status: DC

## 2019-07-21 MED ORDER — HYDROXYZINE HCL 50 MG PO TABS: ORAL_TABLET | ORAL | 2 refills | Status: DC

## 2019-07-21 NOTE — Patient Instructions (Addendum)
Trial of buspar 7.5mg  twice per day.   May take atarax 50mg  every 8 hours as needed. However this may too sedating for you. Trial of 50mg  atarax at bedtime and then may additional 50mg  atarax if you wake up   Klonopin only rarely if needed.   We will await on appointment with Dr Shea Evans.   Let me know if you need anything at all.

## 2019-07-21 NOTE — Assessment & Plan Note (Signed)
Waxing and waning. Trial of buspar 7.5 mg BID. Atarax 50mg  qhs and then another 50mg  if needed. Pending counseling and also consult with Dr Shea Evans; will follow.

## 2019-07-21 NOTE — Progress Notes (Signed)
This visit type was conducted due to national recommendations for restrictions regarding the COVID-19 pandemic (e.g. social distancing).  This format is felt to be most appropriate for this patient at this time.  All issues noted in this document were discussed and addressed.  No physical exam was performed (except for noted visual exam findings with Video Visits). Virtual Visit via Video Note  I connected with@  on 07/21/19 at  9:30 AM EDT by a video enabled telemedicine application and verified that I am speaking with the correct person using two identifiers.  Location patient: home Location provider:work  Persons participating in the virtual visit: patient, provider  I discussed the limitations of evaluation and management by telemedicine and the availability of in person appointments. The patient expressed understanding and agreed to proceed.   HPI:   GAD- Worse yesterday after taking buspar 10mg  in the morning, 10mg  midday and then 0.5mg  klonopin without relief. Feels better today.  doesn't feel buspar 10mg  tid has been helpful. Would like to go back to 7.5mg  TID Continues to have panic attacks , one episode yesterday and feels brought on as 'too  Much medication' . Describes burning feeling in chest and all over body, which hasnt occurred today; feels because she has taken buspar 7.5mg . Doesn't feel like 'acid reflux'.  No nausea, vomiting.  Feels more anxious when laying down however feels better when standing up and moving around. No CP.    50mg  atarax was helpful for sleep.   Has first counseling appointment next week. Seeing Dr. Shea Evans 08/31/19 .   ROS: See pertinent positives and negatives per HPI.  Past Medical History:  Diagnosis Date  . Barretts esophagus   . Chest pain    a. 02/2014 Myoview: Ef 50%, no ischemia.  . Colon polyps   . COPD (chronic obstructive pulmonary disease) ([place])   . Depression with anxiety   . GERD (gastroesophageal reflux disease)   . Neuropathy    . Permanent atrial fibrillation    a. s/p ablation 01/2012 in [facility] by Dr. Boyd Kerbs;  b. On sotalol & Xarelto;  c. 02/2014 Echo: EF 50-55%, mild conc LVH, nl LA size/structure;  d. Recurrent afib 8/15 & 08/29/2014.  [name] Rectal fistula   . Rheumatoid arthritis ([place])    On methotrexate and orencia  . Urinary incontinence   . Vitamin D deficiency     Past Surgical History:  Procedure Laterality Date  . ABDOMINAL HYSTERECTOMY  1992  . APPENDECTOMY    . CARDIAC ELECTROPHYSIOLOGY STUDY AND ABLATION  2013  . CHOLECYSTECTOMY  1987  . COLONOSCOPY N/A 05/08/2015   Procedure: COLONOSCOPY;  Surgeon: Manya Silvas, MD;  Location: [facility] ENDOSCOPY;  Service: Endoscopy;  Laterality: N/A;  . ESOPHAGOGASTRODUODENOSCOPY N/A 05/06/2015   Procedure: ESOPHAGOGASTRODUODENOSCOPY (EGD);  Surgeon: Lollie Sails, MD;  Location: [facility] ENDOSCOPY;  Service: Endoscopy;  Laterality: N/A;  . LAPAROSCOPIC APPENDECTOMY N/A 02/19/2019   Procedure: APPENDECTOMY LAPAROSCOPIC converted to open appendectomy;  Surgeon: Herbert Pun, MD;  Location: [facility] ORS;  Service: General;  Laterality: N/A;  . OOPHORECTOMY    . oophrectomy Bilateral 1992    Family History  Problem Relation Age of Onset  . Depression Mother   . Pancreatic cancer Mother   . Colon cancer Father   . Breast cancer Sister 6  . Diabetes Brother   . Breast cancer Cousin   . Neuropathy Neg Hx     SOCIAL HX: former smoker   Current Outpatient Medications:  .  albuterol (PROVENTIL  HFA;VENTOLIN HFA) 108 (90 Base) MCG/ACT inhaler, Inhale 1-2 puffs into the lungs every 4 (four) hours as needed for wheezing or shortness of breath., Disp: 1 Inhaler, Rfl: 10 .  busPIRone (BUSPAR) 7.5 MG tablet, Take 1 tablet (7.5 mg total) by mouth 2 (two) times daily., Disp: 60 tablet, Rfl: 2 .  clonazePAM (KLONOPIN) 0.5 MG tablet, Take 1/2 tablet daily if needed for anxiety. (Patient taking differently: 0.25 mg 2 (two) times daily as needed. Take 1/2 tablet daily if  needed for anxiety.), Disp: 15 tablet, Rfl: 0 .  ELIQUIS 5 MG TABS tablet, Take 1 tablet (5 mg total) by mouth 2 (two) times daily., Disp: 180 tablet, Rfl: 3 .  esomeprazole (NEXIUM) 20 MG capsule, Take 1 capsule (20 mg total) by mouth daily., Disp: 90 capsule, Rfl: 1 .  furosemide (LASIX) 20 MG tablet, Take 1 tablet (20 mg) by mouth daily, you may take 1 extra tablet (20 mg) after lunch as needed for increased swelling, weight gain, shortness of breath, Disp: 90 tablet, Rfl: 2 .  gabapentin (NEURONTIN) 600 MG tablet, TAKE 1 TABLET BY MOUTH THREE TIMES A DAY, Disp: 90 tablet, Rfl: 1 .  HUMIRA PEN 40 MG/0.4ML PNKT, Inject 40 mg as directed. Every 2 weeks, Disp: , Rfl:  .  hydroxychloroquine (PLAQUENIL) 200 MG tablet, Take 200 mg by mouth 2 (two) times daily. , Disp: , Rfl:  .  hydrOXYzine (ATARAX/VISTARIL) 50 MG tablet, Take 50mg  tablet at bedtime and if you wake up, may take additional dose of 50mg ., Disp: 100 tablet, Rfl: 2 .  lidocaine (XYLOCAINE) 5 % ointment, Apply 1 application topically as needed., Disp: , Rfl:  .  potassium chloride (K-DUR) 10 MEQ tablet, Take 1 tablet (10 meq) by mouth once daily, take 1 extra tablet (10 meq) after lunch when taking extra lasix, Disp: 90 tablet, Rfl: 2 .  propranolol ER (INDERAL LA) 120 MG 24 hr capsule, Take 1 capsule (120 mg total) by mouth daily., Disp: 90 capsule, Rfl: 3 .  traMADol (ULTRAM) 50 MG tablet, Take 1 tablet (50 mg total) by mouth every 12 (twelve) hours as needed for severe pain., Disp: 30 tablet, Rfl: 0 .  TRELEGY ELLIPTA 100-62.5-25 MCG/INH AEPB, INHALE 1 PUFF INTO THE LUNGS DAILY, Disp: 60 each, Rfl: 2  EXAM:  VITALS per patient if applicable:  GENERAL: alert, oriented, appears well and in no acute distress  HEENT: atraumatic, conjunttiva clear, no obvious abnormalities on inspection of external nose and ears  NECK: normal movements of the head and neck  LUNGS: on inspection no signs of respiratory distress, breathing rate appears  normal, no obvious gross SOB, gasping or wheezing  CV: no obvious cyanosis  MS: moves all visible extremities without noticeable abnormality  PSYCH/NEURO: pleasant and cooperative, no obvious depression or anxiety, speech and thought processing grossly intact  ASSESSMENT AND PLAN:  Discussed the following assessment and plan:  Problem List Items Addressed This Visit      Other   Anxiety and depression    Waxing and waning. Trial of buspar 7.5 mg BID. Atarax 50mg  qhs and then another 50mg  if needed. Pending counseling and also consult with Dr Shea Evans; will follow.       Relevant Medications   hydrOXYzine (ATARAX/VISTARIL) 50 MG tablet   busPIRone (BUSPAR) 7.5 MG tablet   Anxiety   Relevant Medications   hydrOXYzine (ATARAX/VISTARIL) 50 MG tablet   busPIRone (BUSPAR) 7.5 MG tablet        I discussed the assessment  and treatment plan with the patient. The patient was provided an opportunity to ask questions and all were answered. The patient agreed with the plan and demonstrated an understanding of the instructions.   The patient was advised to call back or seek an in-person evaluation if the symptoms worsen or if the condition fails to improve as anticipated.   Mable Paris, FNP

## 2019-07-22 ENCOUNTER — Other Ambulatory Visit: Payer: Self-pay

## 2019-07-22 ENCOUNTER — Ambulatory Visit (INDEPENDENT_AMBULATORY_CARE_PROVIDER_SITE_OTHER): Payer: Medicare Other

## 2019-07-22 DIAGNOSIS — E538 Deficiency of other specified B group vitamins: Secondary | ICD-10-CM

## 2019-07-22 MED ORDER — CYANOCOBALAMIN 1000 MCG/ML IJ SOLN
1000.0000 ug | Freq: Once | INTRAMUSCULAR | Status: AC
  Administered 2019-07-22: 1000 ug via INTRAMUSCULAR

## 2019-07-22 NOTE — Progress Notes (Addendum)
Patient presented today for B12 injection.  Administered IM in left deltoid.  Patient tolerated well with no signs of distress.  Reviewed.  Dr Scott  

## 2019-07-26 ENCOUNTER — Encounter (INDEPENDENT_AMBULATORY_CARE_PROVIDER_SITE_OTHER): Payer: Self-pay | Admitting: Vascular Surgery

## 2019-07-26 ENCOUNTER — Other Ambulatory Visit: Payer: Self-pay

## 2019-07-26 ENCOUNTER — Ambulatory Visit (INDEPENDENT_AMBULATORY_CARE_PROVIDER_SITE_OTHER): Payer: Medicare Other | Admitting: Vascular Surgery

## 2019-07-26 ENCOUNTER — Ambulatory Visit (INDEPENDENT_AMBULATORY_CARE_PROVIDER_SITE_OTHER): Payer: Medicare Other

## 2019-07-26 VITALS — BP 112/72 | HR 103 | Resp 19 | Ht 66.5 in | Wt 199.0 lb

## 2019-07-26 DIAGNOSIS — J438 Other emphysema: Secondary | ICD-10-CM

## 2019-07-26 DIAGNOSIS — I739 Peripheral vascular disease, unspecified: Secondary | ICD-10-CM

## 2019-07-26 DIAGNOSIS — I714 Abdominal aortic aneurysm, without rupture, unspecified: Secondary | ICD-10-CM

## 2019-07-26 DIAGNOSIS — K219 Gastro-esophageal reflux disease without esophagitis: Secondary | ICD-10-CM | POA: Diagnosis not present

## 2019-07-26 DIAGNOSIS — Z87891 Personal history of nicotine dependence: Secondary | ICD-10-CM

## 2019-07-26 DIAGNOSIS — M159 Polyosteoarthritis, unspecified: Secondary | ICD-10-CM

## 2019-07-26 DIAGNOSIS — M150 Primary generalized (osteo)arthritis: Secondary | ICD-10-CM

## 2019-07-26 DIAGNOSIS — I4891 Unspecified atrial fibrillation: Secondary | ICD-10-CM

## 2019-07-26 NOTE — Progress Notes (Signed)
MRN : [id number]  Brandy Armstrong is a 73 y.o. (November 04, 1946) female who presents with chief complaint of No chief complaint on file. [name]  History of Present Illness:   The patient presents to the office for evaluation of an abdominal aortic aneurysm. The aneurysm was found incidentally by lumbar plain fils for back pain.  The patient has a well-documented history of rheumatoid arthritis which affects multiple joints including her back.  Consequently, her back pain is not new and by her admission has not changed in its quality or character very much. Patient denies abdominal pain or unusual back pain, no other abdominal complaints.  No history of an acute onset of painful blue discoloration of the toes.     No family history of AAA.   Patient denies amaurosis fugax or TIA symptoms. There is no history of claudication or rest pain symptoms of the lower extremities.  The patient denies angina or shortness of breath.  Duplex ultrasound of the aorta shows a 3.22 cm.  ABI Rt=1.24 and Lt=1.14 Triphasic doppler signals   No outpatient medications have been marked as taking for the 07/26/19 encounter (Appointment) with Delana Meyer, Dolores Lory, MD.    Past Medical History:  Diagnosis Date   Barretts esophagus    Chest pain    a. 02/2014 Myoview: Ef 50%, no ischemia.   Colon polyps    COPD (chronic obstructive pulmonary disease) (HCC)    Depression with anxiety    GERD (gastroesophageal reflux disease)    Neuropathy    Permanent atrial fibrillation    a. s/p ablation 01/2012 in [facility] by Dr. Boyd Kerbs;  b. On sotalol & Xarelto;  c. 02/2014 Echo: EF 50-55%, mild conc LVH, nl LA size/structure;  d. Recurrent afib 8/15 & 08/29/2014.   Rectal fistula    Rheumatoid arthritis ([place])    On methotrexate and orencia   Urinary incontinence    Vitamin D deficiency     Past Surgical History:  Procedure Laterality Date   ABDOMINAL HYSTERECTOMY  1992   APPENDECTOMY     CARDIAC  ELECTROPHYSIOLOGY STUDY AND ABLATION  2013   CHOLECYSTECTOMY  1987   COLONOSCOPY N/A 05/08/2015   Procedure: COLONOSCOPY;  Surgeon: Manya Silvas, MD;  Location: [facility];  Service: Endoscopy;  Laterality: N/A;   ESOPHAGOGASTRODUODENOSCOPY N/A 05/06/2015   Procedure: ESOPHAGOGASTRODUODENOSCOPY (EGD);  Surgeon: Lollie Sails, MD;  Location[facility] ENDOSCOPY;  Service: Endoscopy;  Laterality: N/A;   LAPAROSCOPIC APPENDECTOMY N/A 02/19/2019   Procedure: APPENDECTOMY LAPAROSCOPIC converted to open appendectomy;  Surgeon: Herbert Pun, MD;  Location: [facility] ORS;  Service: General;  Laterality: N/A;   OOPHORECTOMY     oophrectomy Bilateral 1992    Social History Social History   Tobacco Use   Smoking status: Former Smoker    Packs/day: 1.00    Years: 40.00    Pack years: 40.00    Quit date: 12/16/2011    Years since quitting: 7.6   Smokeless tobacco: Never Used  Substance Use Topics   Alcohol use: Not Currently    Alcohol/week: 0.0 standard drinks    Comment: Occasionally has a drink   Drug use: No    Family History Family History  Problem Relation Age of Onset   Depression Mother    Pancreatic cancer Mother    Colon cancer Father    Breast cancer Sister 45   Diabetes Brother    Breast cancer Cousin    Neuropathy Neg Hx     Allergies  Allergen Reactions   Latex     Itching Itching     REVIEW OF SYSTEMS (Negative unless checked)  Constitutional: [] Weight loss  [] Fever  [] Chills Cardiac: [] Chest pain   [] Chest pressure   [] Palpitations   [] Shortness of breath when laying flat   [] Shortness of breath with exertion. Vascular:  [] Pain in legs with walking   [] Pain in legs at rest  [] History of DVT   [] Phlebitis   [] Swelling in legs   [] Varicose veins   [] Non-healing ulcers Pulmonary:   [] Uses home oxygen   [] Productive cough   [] Hemoptysis   [] Wheeze  [] COPD   [] Asthma Neurologic:  [] Dizziness   [] Seizures   [] History of stroke   [] History  of TIA  [] Aphasia   [] Vissual changes   [] Weakness or numbness in arm   [] Weakness or numbness in leg Musculoskeletal:   [] Joint swelling   [] Joint pain   [] Low back pain Hematologic:  [] Easy bruising  [] Easy bleeding   [] Hypercoagulable state   [] Anemic Gastrointestinal:  [] Diarrhea   [] Vomiting  [] Gastroesophageal reflux/heartburn   [] Difficulty swallowing. Genitourinary:  [] Chronic kidney disease   [] Difficult urination  [] Frequent urination   [] Blood in urine Skin:  [] Rashes   [] Ulcers  Psychological:  [] History of anxiety   []  History of major depression.  Physical Examination  There were no vitals filed for this visit. There is no height or weight on file to calculate BMI. Gen: WD/WN, NAD Head: [place]/AT, No temporalis wasting.  Ear/Nose/Throat: Hearing grossly intact, nares w/o erythema or drainage Eyes: PER, EOMI, sclera nonicteric.  Neck: Supple, no large masses.   Pulmonary:  Good air movement, no audible wheezing bilaterally, no use of accessory muscles.  Cardiac: RRR, no JVD Vascular:  Vessel Right Left  Radial Palpable Palpable  PT Trace Palpable Trace Palpable  DP Palpable Palpable  Gastrointestinal: Non-distended. No guarding/no peritoneal signs.  Musculoskeletal: M/S 5/5 throughout.  No deformity or atrophy.  Neurologic: CN 2-12 intact. Symmetrical.  Speech is fluent. Motor exam as listed above. Psychiatric: Judgment intact, Mood & affect appropriate for pt's clinical situation. Dermatologic: No rashes or ulcers noted.  No changes consistent with cellulitis. Lymph : No lichenification or skin changes of chronic lymphedema.  CBC Lab Results  Component Value Date   WBC 5.1 07/09/2019   HGB 10.7 (L) 07/09/2019   HCT 36.7 07/09/2019   MCV 81.6 07/09/2019   PLT 204 07/09/2019    BMET    Component Value Date/Time   NA 139 07/09/2019 1334   NA 141 01/02/2016 1436   NA 139 01/04/2015 0521   K 3.9 07/09/2019 1334   K 3.8 01/04/2015 0521   CL 104 07/09/2019 1334    CL 106 01/04/2015 0521   CO2 27 07/09/2019 1334   CO2 26 01/04/2015 0521   GLUCOSE 99 07/09/2019 1334   GLUCOSE 95 01/04/2015 0521   BUN 10 07/09/2019 1334   BUN 12 01/02/2016 1436   BUN 8 01/04/2015 0521   CREATININE 0.79 07/09/2019 1334   CREATININE 0.84 01/04/2015 0521   CALCIUM 9.1 07/09/2019 1334   CALCIUM 8.5 01/04/2015 0521   GFRNONAA >60 07/09/2019 1334   GFRNONAA >60 01/04/2015 0521   GFRNONAA >60 08/29/2014 1653   GFRAA >60 07/09/2019 1334   GFRAA >60 01/04/2015 0521   GFRAA >60 08/29/2014 1653   Estimated Creatinine Clearance: 74.3 mL/min (by C-G formula based on SCr of 0.79 mg/dL).  COAG Lab Results  Component Value Date   INR 1.27 05/06/2015  INR 2.99 05/04/2015   INR 1.6 01/02/2015    Radiology Dg Chest 2 View  Result Date: 07/09/2019 CLINICAL DATA:  Chest tightness EXAM: CHEST - 2 VIEW COMPARISON:  March 01, 2019 FINDINGS: Mild cardiomegaly and aortic tortuosity, stable. Mild atelectasis in the left base. No other acute abnormalities are identified. IMPRESSION: No acute interval change. Electronically Signed   By: Dorise Bullion III M.D   On: 07/09/2019 14:28      Assessment/Plan 1. AAA (abdominal aortic aneurysm) without rupture (HCC) No surgery or intervention at this time. The patient has an asymptomatic abdominal aortic aneurysm that is less than 4 cm in maximal diameter.  I have discussed the natural history of abdominal aortic aneurysm and the small risk of rupture for aneurysm less than 5 cm in size.  However, as these small aneurysms tend to enlarge over time, continued surveillance with ultrasound or CT scan is mandatory.  I have also discussed optimizing medical management with hypertension and lipid control and the importance of abstinence from tobacco.  The patient is also encouraged to exercise a minimum of 30 minutes 4 times a week.  Should the patient develop new onset abdominal or back pain or signs of peripheral embolization they are  instructed to seek medical attention immediately and to alert the physician providing care that they have an aneurysm.  The patient voices their understanding. The patient will return in 12 months with an aortic duplex.  - VAS US AORTA/IVC/ILIACS; Future  2. PAD (peripheral artery disease) (HCC)  Recommend:  The patient has evidence of atherosclerosis of the lower extremities with claudication.  The patient does not voice lifestyle limiting changes at this point in time.  Noninvasive studies do not suggest clinically significant change.  No invasive studies, angiography or surgery at this time The patient should continue walking and begin a more formal exercise program.  The patient should continue antiplatelet therapy and aggressive treatment of the lipid abnormalities  No changes in the patient's medications at this time  The patient should continue wearing graduated compression socks 10-15 mmHg strength to control the mild edema.    3. Atrial fibrillation with RVR (HCC) Continue antiarrhythmia medications as already ordered, these medications have been reviewed and there are no changes at this time.  Continue anticoagulation as ordered by Cardiology Service   4. Other emphysema ([place]) Continue pulmonary medications and aerosols as already ordered, these medications have been reviewed and there are no changes at this time.    5. Gastroesophageal reflux disease, esophagitis presence not specified Continue PPI as already ordered, this medication has been reviewed and there are no changes at this time.  Avoidence of caffeine and alcohol  Moderate elevation of the head of the bed   6. Primary osteoarthritis involving multiple joints Continue NSAID medications as already ordered, these medications have been reviewed and there are no changes at this time.  Continued activity and therapy was stressed.   Hortencia Pilar, MD  07/26/2019 8:48 AM

## 2019-07-27 ENCOUNTER — Ambulatory Visit (INDEPENDENT_AMBULATORY_CARE_PROVIDER_SITE_OTHER): Payer: Medicare Other | Admitting: Psychology

## 2019-07-27 DIAGNOSIS — F418 Other specified anxiety disorders: Secondary | ICD-10-CM | POA: Diagnosis not present

## 2019-07-27 DIAGNOSIS — F3289 Other specified depressive episodes: Secondary | ICD-10-CM

## 2019-08-10 ENCOUNTER — Ambulatory Visit (INDEPENDENT_AMBULATORY_CARE_PROVIDER_SITE_OTHER): Payer: Medicare Other | Admitting: Psychology

## 2019-08-10 DIAGNOSIS — F418 Other specified anxiety disorders: Secondary | ICD-10-CM

## 2019-08-10 DIAGNOSIS — F3289 Other specified depressive episodes: Secondary | ICD-10-CM

## 2019-08-12 ENCOUNTER — Ambulatory Visit (INDEPENDENT_AMBULATORY_CARE_PROVIDER_SITE_OTHER): Payer: Medicare Other

## 2019-08-12 ENCOUNTER — Other Ambulatory Visit: Payer: Self-pay

## 2019-08-12 DIAGNOSIS — E538 Deficiency of other specified B group vitamins: Secondary | ICD-10-CM | POA: Diagnosis not present

## 2019-08-12 MED ORDER — CYANOCOBALAMIN 1000 MCG/ML IJ SOLN
1000.0000 ug | Freq: Once | INTRAMUSCULAR | Status: AC
  Administered 2019-08-12: 1000 ug via INTRAMUSCULAR

## 2019-08-12 NOTE — Progress Notes (Signed)
Patient presented today for B12 injection.  Administered IM in right deltoid.  Patient tolerated well with no signs of distress.   

## 2019-08-16 DIAGNOSIS — K2270 Barrett's esophagus without dysplasia: Secondary | ICD-10-CM | POA: Diagnosis not present

## 2019-08-16 DIAGNOSIS — K219 Gastro-esophageal reflux disease without esophagitis: Secondary | ICD-10-CM | POA: Diagnosis not present

## 2019-08-16 DIAGNOSIS — D509 Iron deficiency anemia, unspecified: Secondary | ICD-10-CM | POA: Diagnosis not present

## 2019-08-16 DIAGNOSIS — R198 Other specified symptoms and signs involving the digestive system and abdomen: Secondary | ICD-10-CM | POA: Diagnosis not present

## 2019-08-16 DIAGNOSIS — K5520 Angiodysplasia of colon without hemorrhage: Secondary | ICD-10-CM | POA: Diagnosis not present

## 2019-08-18 ENCOUNTER — Ambulatory Visit (INDEPENDENT_AMBULATORY_CARE_PROVIDER_SITE_OTHER): Payer: Medicare Other | Admitting: Psychiatry

## 2019-08-18 ENCOUNTER — Other Ambulatory Visit: Payer: Self-pay

## 2019-08-18 ENCOUNTER — Encounter: Payer: Self-pay | Admitting: Psychiatry

## 2019-08-18 DIAGNOSIS — F410 Panic disorder [episodic paroxysmal anxiety] without agoraphobia: Secondary | ICD-10-CM | POA: Diagnosis not present

## 2019-08-18 DIAGNOSIS — F09 Unspecified mental disorder due to known physiological condition: Secondary | ICD-10-CM

## 2019-08-18 DIAGNOSIS — F331 Major depressive disorder, recurrent, moderate: Secondary | ICD-10-CM | POA: Diagnosis not present

## 2019-08-18 DIAGNOSIS — F411 Generalized anxiety disorder: Secondary | ICD-10-CM | POA: Diagnosis not present

## 2019-08-18 MED ORDER — DULOXETINE HCL 20 MG PO CPEP: 20.0000 mg | ORAL_CAPSULE | Freq: Every day | ORAL | 1 refills | Status: DC
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
## 2019-08-18 NOTE — Patient Instructions (Signed)
Duloxetine delayed-release capsules What is this medicine? DULOXETINE (doo LOX e teen) is used to treat depression, anxiety, and different types of chronic pain. This medicine may be used for other purposes; ask your health care provider or pharmacist if you have questions. COMMON BRAND NAME(S): Cymbalta, Drizalma, Irenka What should I tell my health care provider before I take this medicine? They need to know if you have any of these conditions:  bipolar disorder  glaucoma  high blood pressure  kidney disease  liver disease  seizures  suicidal thoughts, plans or attempt; a previous suicide attempt by you or a family member  take medicines that treat or prevent blood clots  taken medicines called MAOIs like Carbex, Eldepryl, Marplan, Nardil, and Parnate within 14 days  trouble passing urine  an unusual reaction to duloxetine, other medicines, foods, dyes, or preservatives  pregnant or trying to get pregnant  breast-feeding How should I use this medicine? Take this medicine by mouth with a glass of water. Follow the directions on the prescription label. Do not crush, cut or chew some capsules of this medicine. Some capsules may be opened and sprinkled on applesauce. Check with your doctor or pharmacist if you are not sure. You can take this medicine with or without food. Take your medicine at regular intervals. Do not take your medicine more often than directed. Do not stop taking this medicine suddenly except upon the advice of your doctor. Stopping this medicine too quickly may cause serious side effects or your condition may worsen. A special MedGuide will be given to you by the pharmacist with each prescription and refill. Be sure to read this information carefully each time. Talk to your pediatrician regarding the use of this medicine in children. While this drug may be prescribed for children as young as 7 years of age for selected conditions, precautions do  apply. Overdosage: If you think you have taken too much of this medicine contact a poison control center or emergency room at once. NOTE: This medicine is only for you. Do not share this medicine with others. What if I miss a dose? If you miss a dose, take it as soon as you can. If it is almost time for your next dose, take only that dose. Do not take double or extra doses. What may interact with this medicine? Do not take this medicine with any of the following medications:  desvenlafaxine  levomilnacipran  linezolid  MAOIs like Carbex, Eldepryl, Marplan, Nardil, and Parnate  methylene blue (injected into a vein)  milnacipran  thioridazine  venlafaxine This medicine may also interact with the following medications:  alcohol  amphetamines  aspirin and aspirin-like medicines  certain antibiotics like ciprofloxacin and enoxacin  certain medicines for blood pressure, heart disease, irregular heart beat  certain medicines for depression, anxiety, or psychotic disturbances  certain medicines for migraine headache like almotriptan, eletriptan, frovatriptan, naratriptan, rizatriptan, sumatriptan, zolmitriptan  certain medicines that treat or prevent blood clots like warfarin, enoxaparin, and dalteparin  cimetidine  fentanyl  lithium  NSAIDS, medicines for pain and inflammation, like ibuprofen or naproxen  phentermine  procarbazine  rasagiline  sibutramine  St. John's wort  theophylline  tramadol  tryptophan This list may not describe all possible interactions. Give your health care provider a list of all the medicines, herbs, non-prescription drugs, or dietary supplements you use. Also tell them if you smoke, drink alcohol, or use illegal drugs. Some items may interact with your medicine. What should I watch for while   using this medicine? Tell your doctor if your symptoms do not get better or if they get worse. Visit your doctor or healthcare provider for  regular checks on your progress. Because it may take several weeks to see the full effects of this medicine, it is important to continue your treatment as prescribed by your doctor. This medicine may cause serious skin reactions. They can happen weeks to months after starting the medicine. Contact your healthcare provider right away if you notice fevers or flu-like symptoms with a rash. The rash may be red or purple and then turn into blisters or peeling of the skin. Or, you might notice a red rash with swelling of the face, lips, or lymph nodes in your neck or under your arms. Patients and their families should watch out for new or worsening thoughts of suicide or depression. Also watch out for sudden changes in feelings such as feeling anxious, agitated, panicky, irritable, hostile, aggressive, impulsive, severely restless, overly excited and hyperactive, or not being able to sleep. If this happens, especially at the beginning of treatment or after a change in dose, call your healthcare provider. You may get drowsy or dizzy. Do not drive, use machinery, or do anything that needs mental alertness until you know how this medicine affects you. Do not stand or sit up quickly, especially if you are an older patient. This reduces the risk of dizzy or fainting spells. Alcohol may interfere with the effect of this medicine. Avoid alcoholic drinks. This medicine can cause an increase in blood pressure. This medicine can also cause a sudden drop in your blood pressure, which may make you feel faint and increase the chance of a fall. These effects are most common when you first start the medicine or when the dose is increased, or during use of other medicines that can cause a sudden drop in blood pressure. Check with your doctor for instructions on monitoring your blood pressure while taking this medicine. Your mouth may get dry. Chewing sugarless gum or sucking hard candy, and drinking plenty of water, may help.  Contact your doctor if the problem does not go away or is severe. What side effects may I notice from receiving this medicine? Side effects that you should report to your doctor or health care professional as soon as possible:  allergic reactions like skin rash, itching or hives, swelling of the face, lips, or tongue  anxious  breathing problems  confusion  changes in vision  chest pain  confusion  elevated mood, decreased need for sleep, racing thoughts, impulsive behavior  eye pain  fast, irregular heartbeat  feeling faint or lightheaded, falls  feeling agitated, angry, or irritable  hallucination, loss of contact with reality  high blood pressure  loss of balance or coordination  palpitations  redness, blistering, peeling or loosening of the skin, including inside the mouth  restlessness, pacing, inability to keep still  seizures  stiff muscles  suicidal thoughts or other mood changes  trouble passing urine or change in the amount of urine  trouble sleeping  unusual bleeding or bruising  unusually weak or tired  vomiting  yellowing of the eyes or skin Side effects that usually do not require medical attention (report to your doctor or health care professional if they continue or are bothersome):  change in sex drive or performance  change in appetite or weight  constipation  dizziness  dry mouth  headache  increased sweating  nausea  tired This list may not describe   all possible side effects. Call your doctor for medical advice about side effects. You may report side effects to FDA at 1-800-FDA-1088. Where should I keep my medicine? Keep out of the reach of children. Store at room temperature between 15 and 30 degrees C (59 to 86 degrees F). Throw away any unused medicine after the expiration date. NOTE: This sheet is a summary. It may not cover all possible information. If you have questions about this medicine, talk to your doctor,  pharmacist, or health care provider.  2020 Elsevier/Gold Standard (2019-03-18 13:47:50)  

## 2019-08-22 DIAGNOSIS — K5520 Angiodysplasia of colon without hemorrhage: Secondary | ICD-10-CM | POA: Insufficient documentation

## 2019-08-23 ENCOUNTER — Other Ambulatory Visit: Payer: Self-pay | Admitting: Pulmonary Disease

## 2019-08-23 ENCOUNTER — Telehealth: Payer: Self-pay | Admitting: Cardiovascular Disease

## 2019-08-23 NOTE — Telephone Encounter (Signed)
Please comment on eliquis. 

## 2019-08-23 NOTE — Telephone Encounter (Signed)
° °  [facility] Pre-operative Risk Assessment    Request for surgical clearance:  1. What type of surgery is being performed? Colonoscopy / Upper Endoscopy [facility]    2. When is this surgery scheduled? 09-29-2019   3. What type of clearance is required (medical clearance vs. Pharmacy clearance to hold med vs. Both)? Both   4. Are there any medications that need to be held prior to surgery and how long? Eliquis  X 3 days or please advise   5. Practice name and name of physician performing surgery?  [facility]    6. What is your office phone number [phone]    7.   What is your office fax number  [phone]   8.   Anesthesia type (None, local, MAC, general) ?  Monitored    Clarisse Gouge 08/23/2019, 4:11 PM  _________________________________________________________________   (provider comments below)

## 2019-08-24 ENCOUNTER — Ambulatory Visit: Payer: Medicare Other | Admitting: Psychology

## 2019-08-24 NOTE — Telephone Encounter (Signed)
Pt takes Eliquis for afib with CHADS2VASc score of 5 (age, sex, CHF, HTN, CAD/PAD). Renal function is normal. Ok to hold Eliquis for 1-2 days prior to colonoscopy and endoscopy.

## 2019-08-24 NOTE — Telephone Encounter (Signed)
Left VM on both phone numbers.

## 2019-08-24 NOTE — Telephone Encounter (Signed)
LVM with recommendations to hold Eliquis 1-2 days prior to procedure. Asked pt to call back if any questioons.

## 2019-08-24 NOTE — Telephone Encounter (Signed)
   Primary Cardiologist: Ida Rogue, MD  Chart reviewed as part of pre-operative protocol coverage. Patient was contacted 08/24/2019 in reference to pre-operative risk assessment for pending surgery as outlined below.  Brandy Armstrong was last seen on 05/27/19 by Christell Faith PAC.  Since that day, Brandy Armstrong has done well. She still uses a walker at home for comfort. However, she reports activities consistent with at least 4.0 METS. She does not have a history of CAD or MI, stress test in 2015 was normal, and she denies anginal symptoms.  Per our clinical pharmacy staff: Pt takes Eliquis for afib with CHADS2VASc score of 5 (age, sex, CHF, HTN, CAD/PAD). Renal function is normal. Ok to hold Eliquis for 1-2 days prior to colonoscopy and endoscopy.  Therefore, based on ACC/AHA guidelines, the patient would be at acceptable risk for the planned procedure without further cardiovascular testing.   I will route this recommendation to the requesting party via Epic fax function and remove from pre-op pool.  Please call with questions.  Tami Lin Ephraim Reichel, PA 08/24/2019, 9:42 AM

## 2019-08-25 ENCOUNTER — Encounter: Payer: Self-pay | Admitting: Family

## 2019-08-25 ENCOUNTER — Telehealth: Payer: Self-pay

## 2019-08-25 ENCOUNTER — Telehealth: Payer: Self-pay | Admitting: Psychiatry

## 2019-08-25 DIAGNOSIS — F410 Panic disorder [episodic paroxysmal anxiety] without agoraphobia: Secondary | ICD-10-CM

## 2019-08-25 DIAGNOSIS — F411 Generalized anxiety disorder: Secondary | ICD-10-CM

## 2019-08-25 DIAGNOSIS — F331 Major depressive disorder, recurrent, moderate: Secondary | ICD-10-CM

## 2019-08-25 MED ORDER — BUSPIRONE HCL 10 MG PO TABS: 10.0000 mg | ORAL_TABLET | Freq: Two times a day (BID) | ORAL | 1 refills | Status: DC

## 2019-08-25 NOTE — Telephone Encounter (Signed)
pt called left a message that she not taking the medication you order for her. she needs to speak with you about this. It is just not working for her.

## 2019-08-25 NOTE — Telephone Encounter (Signed)
Discontinue Cymbalta for side effects . Does not want remeron. Increase buspar to 10 mg bid and increase to tid after 1 week.

## 2019-08-25 NOTE — Telephone Encounter (Signed)
I called patient in regards to mychart message about anxiety & not knowing where to turn. She does have an appointment with Dr. Melrose Nakayama at [facility] neurology tomorrow & is unsure she needs to be seen by [place]. I scheduled her in your 11:30 on Friday & she will cancel if needed. I told her I wanted to hold that opening just in case. She just doesn't feel like her medications are right & is having a lot of anxiety lately.

## 2019-08-26 DIAGNOSIS — R202 Paresthesia of skin: Secondary | ICD-10-CM | POA: Diagnosis not present

## 2019-08-26 DIAGNOSIS — R200 Anesthesia of skin: Secondary | ICD-10-CM | POA: Diagnosis not present

## 2019-08-27 NOTE — Telephone Encounter (Signed)
Patient didn't come for appt today Call her and see if she would like to reschedule  Please however advise her to continue to follow up with Dr Shea Evans. Since she only had one visit with [place], please do remind her that Trust and effective of therapy may not happen at first visit.

## 2019-08-30 ENCOUNTER — Ambulatory Visit: Payer: Medicare Other | Admitting: Family

## 2019-08-30 NOTE — Telephone Encounter (Signed)
Noted! Thank you

## 2019-08-31 ENCOUNTER — Other Ambulatory Visit: Payer: Self-pay | Admitting: Family

## 2019-08-31 ENCOUNTER — Encounter

## 2019-08-31 ENCOUNTER — Ambulatory Visit: Payer: Medicare Other | Admitting: Psychiatry

## 2019-08-31 DIAGNOSIS — F419 Anxiety disorder, unspecified: Secondary | ICD-10-CM

## 2019-09-03 ENCOUNTER — Other Ambulatory Visit: Payer: Self-pay

## 2019-09-07 ENCOUNTER — Other Ambulatory Visit: Payer: Self-pay

## 2019-09-07 ENCOUNTER — Inpatient Hospital Stay: Payer: Medicare Other | Attending: Oncology

## 2019-09-07 DIAGNOSIS — Z90710 Acquired absence of both cervix and uterus: Secondary | ICD-10-CM | POA: Diagnosis not present

## 2019-09-07 DIAGNOSIS — J449 Chronic obstructive pulmonary disease, unspecified: Secondary | ICD-10-CM | POA: Insufficient documentation

## 2019-09-07 DIAGNOSIS — Z7901 Long term (current) use of anticoagulants: Secondary | ICD-10-CM | POA: Diagnosis not present

## 2019-09-07 DIAGNOSIS — I4821 Permanent atrial fibrillation: Secondary | ICD-10-CM | POA: Insufficient documentation

## 2019-09-07 DIAGNOSIS — Z803 Family history of malignant neoplasm of breast: Secondary | ICD-10-CM | POA: Diagnosis not present

## 2019-09-07 DIAGNOSIS — E538 Deficiency of other specified B group vitamins: Secondary | ICD-10-CM | POA: Diagnosis not present

## 2019-09-07 DIAGNOSIS — D508 Other iron deficiency anemias: Secondary | ICD-10-CM | POA: Insufficient documentation

## 2019-09-07 DIAGNOSIS — Z87891 Personal history of nicotine dependence: Secondary | ICD-10-CM | POA: Diagnosis not present

## 2019-09-07 DIAGNOSIS — Z79899 Other long term (current) drug therapy: Secondary | ICD-10-CM | POA: Insufficient documentation

## 2019-09-07 DIAGNOSIS — Z800 Family history of malignant neoplasm of digestive organs: Secondary | ICD-10-CM | POA: Diagnosis not present

## 2019-09-07 DIAGNOSIS — R197 Diarrhea, unspecified: Secondary | ICD-10-CM | POA: Diagnosis not present

## 2019-09-07 DIAGNOSIS — D500 Iron deficiency anemia secondary to blood loss (chronic): Secondary | ICD-10-CM

## 2019-09-07 LAB — CBC WITH DIFFERENTIAL/PLATELET
Abs Immature Granulocytes: 0.01 10*3/uL (ref 0.00–0.07)
Basophils Absolute: 0 10*3/uL (ref 0.0–0.1)
Basophils Relative: 1 %
Eosinophils Absolute: 0.1 10*3/uL (ref 0.0–0.5)
Eosinophils Relative: 2 %
HCT: 38.7 % (ref 36.0–46.0)
Hemoglobin: 11.7 g/dL — ABNORMAL LOW (ref 12.0–15.0)
Immature Granulocytes: 0 %
Lymphocytes Relative: 26 %
Lymphs Abs: 1.4 10*3/uL (ref 0.7–4.0)
MCH: 25.2 pg — ABNORMAL LOW (ref 26.0–34.0)
MCHC: 30.2 g/dL (ref 30.0–36.0)
MCV: 83.2 fL (ref 80.0–100.0)
Monocytes Absolute: 0.5 10*3/uL (ref 0.1–1.0)
Monocytes Relative: 9 %
Neutro Abs: 3.3 10*3/uL (ref 1.7–7.7)
Neutrophils Relative %: 62 %
Platelets: 160 10*3/uL (ref 150–400)
RBC: 4.65 MIL/uL (ref 3.87–5.11)
RDW: 17.2 % — ABNORMAL HIGH (ref 11.5–15.5)
WBC: 5.3 10*3/uL (ref 4.0–10.5)
nRBC: 0 % (ref 0.0–0.2)

## 2019-09-07 LAB — IRON AND TIBC
Iron: 45 ug/dL (ref 28–170)
Saturation Ratios: 14 % (ref 10.4–31.8)
TIBC: 315 ug/dL (ref 250–450)
UIBC: 270 ug/dL

## 2019-09-07 LAB — FERRITIN: Ferritin: 22 ng/mL (ref 11–307)

## 2019-09-07 NOTE — Progress Notes (Signed)
Pre-screening completed

## 2019-09-08 ENCOUNTER — Inpatient Hospital Stay: Payer: Medicare Other

## 2019-09-08 ENCOUNTER — Encounter: Payer: Self-pay | Admitting: Oncology

## 2019-09-08 ENCOUNTER — Inpatient Hospital Stay (HOSPITAL_BASED_OUTPATIENT_CLINIC_OR_DEPARTMENT_OTHER): Payer: Medicare Other | Admitting: Oncology

## 2019-09-08 ENCOUNTER — Other Ambulatory Visit: Payer: Self-pay

## 2019-09-08 ENCOUNTER — Other Ambulatory Visit
Admission: RE | Admit: 2019-09-08 | Discharge: 2019-09-08 | Disposition: A | Discharge status: Home / Self Care | Payer: Medicare Other | Source: Ambulatory Visit | Attending: Student | Admitting: Student

## 2019-09-08 VITALS — BP 120/86 | HR 105 | Temp 96.4°F | Resp 18 | Wt 205.0 lb

## 2019-09-08 DIAGNOSIS — Z7901 Long term (current) use of anticoagulants: Secondary | ICD-10-CM | POA: Diagnosis not present

## 2019-09-08 DIAGNOSIS — D500 Iron deficiency anemia secondary to blood loss (chronic): Secondary | ICD-10-CM

## 2019-09-08 DIAGNOSIS — I4821 Permanent atrial fibrillation: Secondary | ICD-10-CM | POA: Diagnosis not present

## 2019-09-08 DIAGNOSIS — R197 Diarrhea, unspecified: Secondary | ICD-10-CM | POA: Insufficient documentation

## 2019-09-08 DIAGNOSIS — Z79899 Other long term (current) drug therapy: Secondary | ICD-10-CM | POA: Diagnosis not present

## 2019-09-08 DIAGNOSIS — E538 Deficiency of other specified B group vitamins: Secondary | ICD-10-CM | POA: Diagnosis not present

## 2019-09-08 DIAGNOSIS — Z87891 Personal history of nicotine dependence: Secondary | ICD-10-CM | POA: Diagnosis not present

## 2019-09-08 DIAGNOSIS — Z800 Family history of malignant neoplasm of digestive organs: Secondary | ICD-10-CM | POA: Diagnosis not present

## 2019-09-08 DIAGNOSIS — Z803 Family history of malignant neoplasm of breast: Secondary | ICD-10-CM | POA: Diagnosis not present

## 2019-09-08 DIAGNOSIS — D508 Other iron deficiency anemias: Secondary | ICD-10-CM | POA: Diagnosis not present

## 2019-09-08 DIAGNOSIS — J449 Chronic obstructive pulmonary disease, unspecified: Secondary | ICD-10-CM | POA: Diagnosis not present

## 2019-09-08 NOTE — Progress Notes (Signed)
Patient does not offer any problems today.  

## 2019-09-09 NOTE — Progress Notes (Signed)
Hematology/Oncology Follow up note [facility] [phone] [phone]   Patient Care Team: [name], FNP as PCP - General (Family Medicine) Minna Merritts, MD as PCP - Cardiology (Cardiology)  REFERRING PROVIDER: [name], FNP  REASON FOR VISIT Follow up for treatment of anemia and iron deficiency anemia.   HISTORY OF PRESENTING ILLNESS:  Brandy Armstrong is a  73 y.o.  female with PMH listed below who was referred to me for evaluation of anemia and elevated iron level.  Patient recently had lab work done which revealed anemia with hemoglobin count at 10.2 on 05/04/2018.  I reviewed patient's previous labs, Anemia is chronic, dated back to 2014. She recalls having 1 PRBC transfusion in the past and was told she has iron deficiency anemia. She takes oral OTC  iron supplements and anemia has improved.  Associated with fatigue, lack of energy. Denies weight loss, easy bruising, hematochezia, hemoptysis.  Last colonoscopy 2016 revealed polyps which were removed. No malignancy was found.  Last EGD: 2016 which showed gastritis.   Reviewed recent iron panel which showed elevated iron at 346, TSAT elevated at 89, ferritin 26. Denies any family history of hemachromatosis.  Recent B12 level showed decreased B12, she reports that she has been started on parental B12 injections with her PCP.  History RF, she takes plaquenil 200mg  daily.  History of Afib, takes Elqiuis 5mg  BID.   # UA on 06/22/2019 showed no hematuria. Patient takes 0.25 mg Klonopin twice daily.  PCP recommended a trial of BuSpar and counseling.  INTERVAL HISTORY Brandy Armstrong is a 73 y.o. female who has above history reviewed by me today presents for follow-up for management of iron deficiency anemia.  Patient is on Eliquis for atrial fibrillation.  Status post IV Venofer 200 mg x 4. Reports doing well.  Denies any bleeding events. Fatigue level is at baseline. No  shortness of breath, fever, chills, chest pain, abdominal pain.  Anxiety has been well controlled. Review of Systems  Constitutional: Negative for chills, fever, malaise/fatigue and weight loss.  HENT: Negative for sore throat.   Eyes: Negative for redness.  Respiratory: Negative for cough, shortness of breath and wheezing.   Cardiovascular: Negative for chest pain, palpitations and leg swelling.  Gastrointestinal: Negative for abdominal pain, blood in stool, nausea and vomiting.  Genitourinary: Negative for dysuria.  Musculoskeletal: Negative for myalgias.  Skin: Negative for rash.  Neurological: Negative for dizziness, tingling and tremors.  Endo/Heme/Allergies: Does not bruise/bleed easily.  Psychiatric/Behavioral: Negative for hallucinations.       Anxiety    MEDICAL HISTORY:  Past Medical History:  Diagnosis Date  . A-fib ([place])   . Barretts esophagus   . Chest pain    a. 02/2014 Myoview: Ef 50%, no ischemia.  . Colon polyps   . COPD (chronic obstructive pulmonary disease) ([place])   . Depression with anxiety   . GERD (gastroesophageal reflux disease)   . Neuropathy   . Permanent atrial fibrillation    a. s/p ablation 01/2012 in [facility] by Dr. Boyd Kerbs;  b. On sotalol & Xarelto;  c. 02/2014 Echo: EF 50-55%, mild conc LVH, nl LA size/structure;  d. Recurrent afib 8/15 & 08/29/2014.  [name] Rectal fistula   . Rheumatoid arthritis ([place])    On methotrexate and orencia  . Urinary incontinence   . Vitamin D deficiency     SURGICAL HISTORY: Past Surgical History:  Procedure Laterality Date  . ABDOMINAL HYSTERECTOMY  1992  .  APPENDECTOMY    . CARDIAC ELECTROPHYSIOLOGY STUDY AND ABLATION  2013  . CHOLECYSTECTOMY  1987  . COLONOSCOPY N/A 05/08/2015   Procedure: COLONOSCOPY;  Surgeon: Manya Silvas, MD;  Location: [facility] ENDOSCOPY;  Service: Endoscopy;  Laterality: N/A;  . ESOPHAGOGASTRODUODENOSCOPY N/A 05/06/2015   Procedure: ESOPHAGOGASTRODUODENOSCOPY (EGD);  Surgeon: Lollie Sails, MD;  Location: [facility] ENDOSCOPY;  Service: Endoscopy;  Laterality: N/A;  . LAPAROSCOPIC APPENDECTOMY N/A 02/19/2019   Procedure: APPENDECTOMY LAPAROSCOPIC converted to open appendectomy;  Surgeon: Herbert Pun, MD;  Location: [facility] ORS;  Service: General;  Laterality: N/A;  . OOPHORECTOMY    . oophrectomy Bilateral 1992    SOCIAL HISTORY: Social History   Socioeconomic History  . Marital status: Single    Spouse name: Not on file  . Number of children: 0  . Years of education: 16  . Highest education level: Bachelor's degree (e.g., BA, AB, BS)  Occupational History  . Occupation: Retired  Scientific laboratory technician  . Financial resource strain: Somewhat hard  . Food insecurity    Worry: Sometimes true    Inability: Sometimes true  . Transportation needs    Medical: No    Non-medical: No  Tobacco Use  . Smoking status: Former Smoker    Packs/day: 1.00    Years: 40.00    Pack years: 40.00    Quit date: 12/16/2011    Years since quitting: 7.7  . Smokeless tobacco: Never Used  Substance and Sexual Activity  . Alcohol use: Not Currently    Alcohol/week: 0.0 standard drinks    Comment: Occasionally has a drink  . Drug use: No  . Sexual activity: Never  Lifestyle  . Physical activity    Days per week: 0 days    Minutes per session: 0 min  . Stress: To some extent  Relationships  . Social Herbalist on phone: Not on file    Gets together: Not on file    Attends religious service: More than 4 times per year    Active member of club or organization: No    Attends meetings of clubs or organizations: Never    Relationship status: Never married  . Intimate partner violence    Fear of current or ex partner: No    Emotionally abused: No    Physically abused: No    Forced sexual activity: No  Other Topics Concern  . Not on file  Social History Narrative   Ms. Socha was born and reared in [place]. She attended [employer] and graduated in 1971 with her  [place] in Education. 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 She then worked for a [employer] in [place]. She is single. She is currently retired. She loves reading. She loves to spend time with her dog.       Caffeine use: 2 cups coffee per day   2 glasses iced tea/ day   Right-handed    FAMILY HISTORY: Family History  Problem Relation Age of Onset  . Depression Mother   . Pancreatic cancer Mother   . Suicidality Mother   . Colon cancer Father   . Breast cancer Sister 63  . Diabetes Brother   . Breast cancer Cousin   . Neuropathy Neg Hx     ALLERGIES:  is allergic to latex.  MEDICATIONS:  Current Outpatient Medications  Medication Sig Dispense Refill  . albuterol (PROVENTIL HFA;VENTOLIN HFA) 108 (90 Base) MCG/ACT  inhaler Inhale 1-2 puffs into the lungs every 4 (four) hours as needed for wheezing or shortness of breath. 1 Inhaler 10  . busPIRone (BUSPAR) 10 MG tablet Take 1 tablet (10 mg total) by mouth 2 (two) times daily. 60 tablet 1  . clonazePAM (KLONOPIN) 0.5 MG tablet Take 1/2 tablet daily if needed for anxiety. (Patient taking differently: 0.25 mg 2 (two) times daily as needed. Take 1/2 tablet daily if needed for anxiety.) 15 tablet 0  . ELIQUIS 5 MG TABS tablet Take 1 tablet (5 mg total) by mouth 2 (two) times daily. 180 tablet 3  . esomeprazole (NEXIUM) 20 MG capsule Take 1 capsule (20 mg total) by mouth daily. 90 capsule 1  . furosemide (LASIX) 20 MG tablet Take 1 tablet (20 mg) by mouth daily, you may take 1 extra tablet (20 mg) after lunch as needed for increased swelling, weight gain, shortness of breath 90 tablet 2  . gabapentin (NEURONTIN) 600 MG tablet TAKE 1 TABLET BY MOUTH THREE TIMES A DAY 90 tablet 1  . HUMIRA PEN 40 MG/0.4ML PNKT Inject 40 mg as directed. Every 2 weeks    . hydroxychloroquine (PLAQUENIL) 200 MG tablet Take 200 mg by mouth 2 (two) times daily.     . hydrOXYzine (ATARAX/VISTARIL)  50 MG tablet TAKE 1 TABLET BY MOUTH AT BEDTIME AND IF YOU WAKE UP, MAY TAKE ADDITIONAL DOSE OF 50MG  (1 TABLET). 180 tablet 2  . lidocaine (XYLOCAINE) 5 % ointment Apply 1 application topically as needed.    . potassium chloride (K-DUR) 10 MEQ tablet Take 1 tablet (10 meq) by mouth once daily, take 1 extra tablet (10 meq) after lunch when taking extra lasix 90 tablet 2  . propranolol ER (INDERAL LA) 120 MG 24 hr capsule Take 1 capsule (120 mg total) by mouth daily. 90 capsule 3  . traMADol (ULTRAM) 50 MG tablet Take 1 tablet (50 mg total) by mouth every 12 (twelve) hours as needed for severe pain. 30 tablet 0  . TRELEGY ELLIPTA 100-62.5-25 MCG/INH AEPB TAKE 1 PUFF BY MOUTH EVERY DAY 60 each 2   No current facility-administered medications for this visit.      PHYSICAL EXAMINATION: ECOG PERFORMANCE STATUS: 1 - Symptomatic but completely ambulatory Vitals:   09/08/19 1354  BP: 120/86  Pulse: (!) 105  Resp: 18  Temp: (!) 96.4 F (35.8 C)   Filed Weights   09/08/19 1354  Weight: 205 lb (93 kg)    Physical Exam Constitutional:      General: She is not in acute distress.    Appearance: She is obese.     Comments: Walks with a walker  HENT:     Head: Normocephalic and atraumatic.  Eyes:     General: No scleral icterus.    Conjunctiva/sclera: Conjunctivae normal.     Pupils: Pupils are equal, round, and reactive to light.  Neck:     Musculoskeletal: Normal range of motion and neck supple.  Cardiovascular:     Rate and Rhythm: Normal rate and regular rhythm.     Heart sounds: Normal heart sounds.  Pulmonary:     Effort: Pulmonary effort is normal. No respiratory distress.     Breath sounds: No wheezing.     Comments: Decreased breath sound bilaterally Chest:     Chest wall: No tenderness.  Abdominal:     General: Bowel sounds are normal. There is no distension.     Palpations: Abdomen is soft. There is no mass.  Tenderness: There is no abdominal tenderness.   Musculoskeletal: Normal range of motion.        General: No deformity.  Lymphadenopathy:     Cervical: No cervical adenopathy.  Skin:    General: Skin is warm and dry.     Findings: No erythema or rash.  Neurological:     Mental Status: She is alert and oriented to person, place, and time.     Cranial Nerves: No cranial nerve deficit.     Coordination: Coordination normal.  Psychiatric:        Mood and Affect: Mood normal.      LABORATORY DATA:  I have reviewed the data as listed Lab Results  Component Value Date   WBC 5.3 09/07/2019   HGB 11.7 (L) 09/07/2019   HCT 38.7 09/07/2019   MCV 83.2 09/07/2019   PLT 160 09/07/2019   Recent Labs    02/21/19 0443 02/22/19 0641  03/03/19 0600 04/15/19 0901 05/27/19 1149 07/09/19 1334  NA 138 138   < > 138 139 139 139  K 4.3 4.4   < > 3.6 3.8 3.2* 3.9  CL 109 106   < > 107 102 105 104  CO2 21* 24   < > 23 29 27 27   GLUCOSE 116* 105*   < > 88 81 98 99  BUN 16 15   < > 8 9 15 10   CREATININE 0.77 0.69   < > 0.56 0.87 0.75 0.79  CALCIUM 8.5* 8.8*   < > 8.6* 8.7 8.8* 9.1  GFRNONAA >60 >60   < > >60  --  >60 >60  GFRAA >60 >60   < > >60  --  >60 >60  PROT 6.1* 6.3*  --   --  6.2  --   --   ALBUMIN 2.8* 2.7*  --   --  3.1*  --   --   AST 19 16  --   --  21  --   --   ALT 10 9  --   --  7  --   --   ALKPHOS 50 53  --   --  82  --   --   BILITOT 0.8 0.7  --   --  0.6  --   --    < > = values in this interval not displayed.       ASSESSMENT & PLAN:  1. Iron deficiency anemia due to chronic blood loss   2. Chronic anticoagulation   3. B12 deficiency   Labs are reviewed and discussed with patient. #Iron deficiency Anemia:  Status post IV Venofer 200 mg x 4 treatments. Hemoglobin has improved to 11.7.  MCV 83.2.  Iron panel showed improved ferritin to 22, iron saturation at 14. Hold additional IV iron at this point.  Patient may have ongoing blood loss from GI tract.  She has a history of small bowel AVMs.Advised patient to  continue follow-up with [facility] gastroenterology.  Per patient she is going to have a colonoscopy in the near future.  History of vitamin B12 deficiency.  Most recent B12 count is 261.  Continue vitamin B12 injections at primary care physician's office.   All questions were answered. The patient knows to call the clinic with any problems questions or concerns.  Orders Placed This Encounter  Procedures  . CBC with Differential/Platelet    Standing Status:   Future    Standing Expiration Date:   09/07/2020  . Iron  and TIBC    Standing Status:   Future    Standing Expiration Date:   09/07/2020  . Ferritin    Standing Status:   Future    Standing Expiration Date:   09/07/2020    Return of visit:   4 months    Earlie Server, MD, PhD 09/09/2019

## 2019-09-10 ENCOUNTER — Ambulatory Visit (INDEPENDENT_AMBULATORY_CARE_PROVIDER_SITE_OTHER): Payer: Medicare Other | Admitting: Psychology

## 2019-09-10 DIAGNOSIS — F3289 Other specified depressive episodes: Secondary | ICD-10-CM

## 2019-09-10 DIAGNOSIS — F418 Other specified anxiety disorders: Secondary | ICD-10-CM

## 2019-09-11 ENCOUNTER — Other Ambulatory Visit: Payer: Self-pay | Admitting: Family

## 2019-09-11 DIAGNOSIS — G629 Polyneuropathy, unspecified: Secondary | ICD-10-CM

## 2019-09-13 ENCOUNTER — Telehealth: Payer: Self-pay

## 2019-09-13 DIAGNOSIS — F411 Generalized anxiety disorder: Secondary | ICD-10-CM

## 2019-09-13 DIAGNOSIS — F410 Panic disorder [episodic paroxysmal anxiety] without agoraphobia: Secondary | ICD-10-CM

## 2019-09-13 DIAGNOSIS — F331 Major depressive disorder, recurrent, moderate: Secondary | ICD-10-CM

## 2019-09-13 MED ORDER — BUSPIRONE HCL 7.5 MG PO TABS: 7.5000 mg | ORAL_TABLET | Freq: Three times a day (TID) | ORAL | 1 refills | Status: DC

## 2019-09-13 MED ORDER — SERTRALINE HCL 25 MG PO TABS: 25.0000 mg | ORAL_TABLET | Freq: Every day | ORAL | 1 refills | Status: DC

## 2019-09-13 NOTE — Telephone Encounter (Signed)
pt states that she thanks she needs to go back onto the zolfot 25mg   states she feels like she needs something for anxiety.

## 2019-09-13 NOTE — Telephone Encounter (Signed)
Patient wants to start Zoloft - which she has tried in the past .Will sent the medication to the pharmacy.

## 2019-09-14 ENCOUNTER — Ambulatory Visit (INDEPENDENT_AMBULATORY_CARE_PROVIDER_SITE_OTHER): Payer: Medicare Other

## 2019-09-14 ENCOUNTER — Other Ambulatory Visit: Payer: Self-pay

## 2019-09-14 DIAGNOSIS — Z23 Encounter for immunization: Secondary | ICD-10-CM | POA: Diagnosis not present

## 2019-09-14 DIAGNOSIS — E538 Deficiency of other specified B group vitamins: Secondary | ICD-10-CM | POA: Diagnosis not present

## 2019-09-15 MED ORDER — CYANOCOBALAMIN 1000 MCG/ML IJ SOLN
1000.0000 ug | Freq: Once | INTRAMUSCULAR | Status: AC
  Administered 2019-09-14: 10:00:00 1000 ug via INTRAMUSCULAR

## 2019-09-15 NOTE — Progress Notes (Signed)
Patient presented today for B12 injection.  Administered IM in left deltoid.  Patient tolerated well with no signs of distress.   

## 2019-09-21 LAB — GI PATHOGEN PANEL BY PCR, STOOL
Adenovirus F 40/41: NOT DETECTED
Astrovirus: NOT DETECTED
C difficile toxin A/B: NOT DETECTED
Campylobacter by PCR: NOT DETECTED
Cryptosporidium by PCR: NOT DETECTED
Cyclospora cayetanensis: NOT DETECTED
E coli (ETEC) LT/ST: NOT DETECTED
E coli (STEC): NOT DETECTED
E coli 0157 by PCR: NOT DETECTED
Entamoeba histolytica: NOT DETECTED
Enteroaggregative E coli: NOT DETECTED
Enteropathogenic E coli: NOT DETECTED
G lamblia by PCR: NOT DETECTED
Norovirus GI/GII: NOT DETECTED
Plesiomonas shigelloides: NOT DETECTED
Rotavirus A by PCR: NOT DETECTED
Salmonella by PCR: NOT DETECTED
Sapovirus: NOT DETECTED
Shigella by PCR: NOT DETECTED
Vibrio cholerae: NOT DETECTED
Vibrio: NOT DETECTED
Yersinia enterocolitica: NOT DETECTED

## 2019-09-21 LAB — PANCREATIC ELASTASE, FECAL: Pancreatic Elastase-1, Stool: 114

## 2019-09-21 LAB — C DIFFICILE QUICK SCREEN W PCR REFLEX
C Diff antigen: NEGATIVE
C Diff toxin: NEGATIVE

## 2019-09-21 LAB — CALPROTECTIN, FECAL: Calprotectin, Fecal: 79

## 2019-09-23 NOTE — Progress Notes (Signed)
B12 injection, patient tolerated well  LGuse FNP

## 2019-09-24 ENCOUNTER — Other Ambulatory Visit
Admission: RE | Admit: 2019-09-24 | Discharge: 2019-09-24 | Disposition: A | Discharge status: Home / Self Care | Payer: Medicare Other | Source: Ambulatory Visit | Attending: Internal Medicine | Admitting: Internal Medicine

## 2019-09-24 DIAGNOSIS — Z20828 Contact with and (suspected) exposure to other viral communicable diseases: Secondary | ICD-10-CM | POA: Diagnosis not present

## 2019-09-24 DIAGNOSIS — Z01812 Encounter for preprocedural laboratory examination: Secondary | ICD-10-CM | POA: Diagnosis not present

## 2019-09-25 LAB — SARS CORONAVIRUS 2 (TAT 6-24 HRS): SARS Coronavirus 2: NEGATIVE

## 2019-09-28 ENCOUNTER — Encounter: Payer: Self-pay | Admitting: Psychiatry

## 2019-09-28 ENCOUNTER — Encounter: Payer: Self-pay | Admitting: *Deleted

## 2019-09-28 ENCOUNTER — Other Ambulatory Visit: Payer: Self-pay | Admitting: Family

## 2019-09-28 ENCOUNTER — Other Ambulatory Visit: Payer: Self-pay

## 2019-09-28 ENCOUNTER — Ambulatory Visit (INDEPENDENT_AMBULATORY_CARE_PROVIDER_SITE_OTHER): Payer: Medicare Other | Admitting: Psychiatry

## 2019-09-28 DIAGNOSIS — F331 Major depressive disorder, recurrent, moderate: Secondary | ICD-10-CM | POA: Diagnosis not present

## 2019-09-28 DIAGNOSIS — F410 Panic disorder [episodic paroxysmal anxiety] without agoraphobia: Secondary | ICD-10-CM | POA: Diagnosis not present

## 2019-09-28 DIAGNOSIS — F419 Anxiety disorder, unspecified: Secondary | ICD-10-CM

## 2019-09-28 DIAGNOSIS — F09 Unspecified mental disorder due to known physiological condition: Secondary | ICD-10-CM

## 2019-09-28 DIAGNOSIS — F411 Generalized anxiety disorder: Secondary | ICD-10-CM | POA: Diagnosis not present

## 2019-09-28 NOTE — Progress Notes (Signed)
Virtual Visit via Video Note  I connected with Brandy Armstrong on 09/28/19 at  8:30 AM EDT by a video enabled telemedicine application and verified that I am speaking with the correct person using two identifiers.   I discussed the limitations of evaluation and management by telemedicine and the availability of in person appointments. The patient expressed understanding and agreed to proceed.    I discussed the assessment and treatment plan with the patient. The patient was provided an opportunity to ask questions and all were answered. The patient agreed with the plan and demonstrated an understanding of the instructions.   The patient was advised to call back or seek an in-person evaluation if the symptoms worsen or if the condition fails to improve as anticipated.   [facility] MD OP Progress Note  09/28/2019 11:43 AM Brandy Armstrong  MRN:  [id number]  Chief Complaint:  Chief Complaint    Follow-up     HPI: Brandy Armstrong is a 73 year old Caucasian female, retired, single, lives in [place], has a history of depression, anxiety, panic attacks, atrial fibrillation, peripheral artery disease, gastroesophageal reflux disease, coronary artery disease, neuropathy, memory problems, history of encephalopathy was evaluated by telemedicine today.  Patient was started on Zoloft a week ago.  She did not tolerate the Cymbalta.  Patient today reports she is doing well on the Zoloft.  She has not noticed any side effects at this time.  She also reports her mood is improving.  Patient reports sleep is good.  Patient denies any suicidality, homicidality or perceptual disturbances.  Patient reports she has upcoming procedure coming up tomorrow since she is having anemia.  She reports she had to go for screening today to get prepared today.  She reports she is slightly anxious however wants to get this done.  Patient reports she continues to be in therapy with her therapist Mr. Buena Irish. She reports therapy sessions  is going well.  Patient denies any other concerns today. Visit Diagnosis:    ICD-10-CM   1. MDD (major depressive disorder), recurrent episode, moderate (HCC)  F33.1   2. GAD (generalized anxiety disorder)  F41.1   3. Panic attack  F41.0   4. Cognitive disorder  F09     Past Psychiatric History: I have reviewed past psychiatric history from my progress note 08/18/2019.  Past trials of Zoloft, Prozac, Lexapro, Xanax, Klonopin  Past Medical History:  Past Medical History:  Diagnosis Date  . A-fib ([place])   . Barretts esophagus   . Chest pain    a. 02/2014 Myoview: Ef 50%, no ischemia.  . Colon polyps   . COPD (chronic obstructive pulmonary disease) ([place])   . Depression with anxiety   . GERD (gastroesophageal reflux disease)   . Neuropathy   . Permanent atrial fibrillation    a. s/p ablation 01/2012 in [facility] by Dr. Boyd Kerbs;  b. On sotalol & Xarelto;  c. 02/2014 Echo: EF 50-55%, mild conc LVH, nl LA size/structure;  d. Recurrent afib 8/15 & 08/29/2014.  [name] Rectal fistula   . Rheumatoid arthritis ([place])    On methotrexate and orencia  . Urinary incontinence   . Vitamin D deficiency     Past Surgical History:  Procedure Laterality Date  . ABDOMINAL HYSTERECTOMY  1992  . APPENDECTOMY    . CARDIAC ELECTROPHYSIOLOGY STUDY AND ABLATION  2013  . CHOLECYSTECTOMY  1987  . COLONOSCOPY N/A 05/08/2015   Procedure: COLONOSCOPY;  Surgeon: Manya Silvas, MD;  Location[facility] ENDOSCOPY;  Service:  Endoscopy;  Laterality: N/A;  . ESOPHAGOGASTRODUODENOSCOPY N/A 05/06/2015   Procedure: ESOPHAGOGASTRODUODENOSCOPY (EGD);  Surgeon: Lollie Sails, MD;  Location: [facility] ENDOSCOPY;  Service: Endoscopy;  Laterality: N/A;  . LAPAROSCOPIC APPENDECTOMY N/A 02/19/2019   Procedure: APPENDECTOMY LAPAROSCOPIC converted to open appendectomy;  Surgeon: Herbert Pun, MD;  Location: [facility] ORS;  Service: General;  Laterality: N/A;  . OOPHORECTOMY    . oophrectomy Bilateral 1992    Family Psychiatric History:  I have reviewed family psychiatric history from my progress note on 08/18/2019  Family History:  Family History  Problem Relation Age of Onset  . Depression Mother   . Pancreatic cancer Mother   . Suicidality Mother   . Colon cancer Father   . Breast cancer Sister 27  . Diabetes Brother   . Breast cancer Cousin   . Neuropathy Neg Hx     Social History: I have reviewed social history from my progress note on 08/18/2019 Social History   Socioeconomic History  . Marital status: Single    Spouse name: Not on file  . Number of children: 0  . Years of education: 16  . Highest education level: Bachelor's degree (e.g., BA, AB, BS)  Occupational History  . Occupation: Retired  Scientific laboratory technician  . Financial resource strain: Somewhat hard  . Food insecurity    Worry: Sometimes true    Inability: Sometimes true  . Transportation needs    Medical: No    Non-medical: No  Tobacco Use  . Smoking status: Former Smoker    Packs/day: 1.00    Years: 40.00    Pack years: 40.00    Quit date: 12/16/2011    Years since quitting: 7.7  . Smokeless tobacco: Never Used  Substance and Sexual Activity  . Alcohol use: Not Currently    Alcohol/week: 0.0 standard drinks    Comment: Occasionally has a drink  . Drug use: No  . Sexual activity: Never  Lifestyle  . Physical activity    Days per week: 0 days    Minutes per session: 0 min  . Stress: To some extent  Relationships  . Social Herbalist on phone: Not on file    Gets together: Not on file    Attends religious service: More than 4 times per year    Active member of club or organization: No    Attends meetings of clubs or organizations: Never    Relationship status: Never married  Other Topics Concern  . Not on file  Social History Narrative   Ms. Scheible was born and reared in [place]. She attended [employer] and graduated in 1971 with her [place] in Education. 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 She then worked for a [employer] in [place]. She is single. She is currently retired. She loves reading. She loves to spend time with her dog.       Caffeine use: 2 cups coffee per day   2 glasses iced tea/ day   Right-handed    Allergies:  Allergies  Allergen Reactions  . Latex     Itching Itching    Metabolic Disorder Labs: Lab Results  Component Value Date   HGBA1C 5.5 10/12/2018   No results found for: PROLACTIN Lab Results  Component Value Date   CHOL 155 10/12/2018   TRIG 3,560 (H) 02/19/2019   HDL 38.90 (L) 10/12/2018   CHOLHDL 4 10/12/2018   VLDL 35.8  10/12/2018   [place] 80 10/12/2018   LDLCALC 84 09/24/2016   Lab Results  Component Value Date   TSH 3.25 05/12/2019   TSH 4.70 (H) 04/15/2019    Therapeutic Level Labs: No results found for: LITHIUM No results found for: VALPROATE No components found for:  CBMZ  Current Medications: Current Outpatient Medications  Medication Sig Dispense Refill  . albuterol (PROVENTIL HFA;VENTOLIN HFA) 108 (90 Base) MCG/ACT inhaler Inhale 1-2 puffs into the lungs every 4 (four) hours as needed for wheezing or shortness of breath. 1 Inhaler 10  . busPIRone (BUSPAR) 7.5 MG tablet Take 1 tablet (7.5 mg total) by mouth 3 (three) times daily. 90 tablet 1  . clonazePAM (KLONOPIN) 0.5 MG tablet Take 1/2 tablet daily if needed for anxiety. (Patient taking differently: 0.25 mg 2 (two) times daily as needed. Take 1/2 tablet daily if needed for anxiety.) 15 tablet 0  . ELIQUIS 5 MG TABS tablet Take 1 tablet (5 mg total) by mouth 2 (two) times daily. 180 tablet 3  . esomeprazole (NEXIUM) 20 MG capsule Take 1 capsule (20 mg total) by mouth daily. 90 capsule 1  . furosemide (LASIX) 20 MG tablet Take 1 tablet (20 mg) by mouth daily, you may take 1 extra tablet (20 mg) after lunch as needed for increased swelling, weight gain, shortness of breath 90 tablet 2  . gabapentin (NEURONTIN) 600 MG tablet TAKE  1 TABLET BY MOUTH THREE TIMES A DAY 90 tablet 1  . HUMIRA PEN 40 MG/0.4ML PNKT Inject 40 mg as directed. Every 2 weeks    . hydroxychloroquine (PLAQUENIL) 200 MG tablet Take 200 mg by mouth 2 (two) times daily.     . hydrOXYzine (ATARAX/VISTARIL) 50 MG tablet TAKE 1 TABLET BY MOUTH AT BEDTIME AND IF YOU WAKE UP, MAY TAKE ADDITIONAL DOSE OF 50MG  (1 TABLET). 180 tablet 2  . lidocaine (XYLOCAINE) 5 % ointment Apply 1 application topically as needed.    . potassium chloride (K-DUR) 10 MEQ tablet Take 1 tablet (10 meq) by mouth once daily, take 1 extra tablet (10 meq) after lunch when taking extra lasix 90 tablet 2  . propranolol ER (INDERAL LA) 120 MG 24 hr capsule Take 1 capsule (120 mg total) by mouth daily. 90 capsule 3  . sertraline (ZOLOFT) 25 MG tablet Take 1 tablet (25 mg total) by mouth daily with breakfast. 30 tablet 1  . traMADol (ULTRAM) 50 MG tablet Take 1 tablet (50 mg total) by mouth every 12 (twelve) hours as needed for severe pain. 30 tablet 0  . TRELEGY ELLIPTA 100-62.5-25 MCG/INH AEPB TAKE 1 PUFF BY MOUTH EVERY DAY 60 each 2   No current facility-administered medications for this visit.      Musculoskeletal: Strength & Muscle Tone: UTA Gait & Station: walks with walker Patient leans: N/A  Psychiatric Specialty Exam: Review of Systems  Psychiatric/Behavioral: Negative for hallucinations and substance abuse. The patient is not nervous/anxious.   All other systems reviewed and are negative.   There were no vitals taken for this visit.There is no height or weight on file to calculate BMI.  General Appearance: Casual  Eye Contact:  Fair  Speech:  Normal Rate  Volume:  Normal  Mood:  Euthymic  Affect:  Congruent  Thought Process:  Goal Directed and Descriptions of Associations: Intact  Orientation:  Full (Time, Place, and Person)  Thought Content: Logical   Suicidal Thoughts:  No  Homicidal Thoughts:  No  Memory:  Immediate;   Fair Recent;  Fair Remote;   Fair   Judgement:  Fair  Insight:  Fair  Psychomotor Activity:  Normal  Concentration:  Concentration: Fair and Attention Span: Fair  Recall:  [employer] of Knowledge: Fair  Language: Fair  Akathisia:  No  Handed:  Right  AIMS (if indicated): denies tremors, rigidity  Assets:  Communication Skills Desire for Improvement Housing  ADL's:  Intact  Cognition: WNL  Sleep:  Fair   Screenings: Mini-Mental     Clinical Support from 08/01/2016 in [facility]  Total Score (max 30 points )  30    PHQ2-9     Office Visit from 07/16/2019 in [facility] Office Visit from 12/09/2018 in [place] Office Visit from 01/28/2017 in [place] from 08/01/2016 in [place] Office Visit from 01/25/2015 in [place]  PHQ-2 Total Score  0  6  0  0  0  PHQ-9 Total Score  -  21  -  -  -       Assessment and Plan: Tilisha is a 73 year old Caucasian female who has a history of depression, anxiety, atrial fibrillation, gastroesophageal reflux disease, neuropathy, coronary artery disease, history of encephalopathy, memory changes was evaluated by telemedicine today.  Patient is biologically predisposed given her family history as well as multiple medical problems.  She also has psychosocial stressors of being single, health problems.  Patient was recently started on Zoloft and is making progress.  We will continue plan as noted below.  Plan For MDD- some progress Zoloft 25 mg p.o. daily BuSpar 7.5 mg 3 times a day.  GAD-improving Continue psychotherapy sessions with her therapist Mr. Debbe Bales. Continue Zoloft. She is on Klonopin however she takes it for her neuropathy-prescribed by neurologist.  Patient is aware about the risk of being on long-term benzodiazepine therapy as well as its effect on her cognitive function.  Insomnia- improving Discussed sleep hygiene  techniques Continue hydroxyzine as needed at this time  Panic attacks- stable We will continue to monitor closely  Cognitive disorder-unspecified-patient does report memory problems especially remote memory.  She will continue to work with her neurologist.  Today she appeared to be alert oriented and was able to answer all questions appropriately.  Follow-up in clinic in 4 weeks or sooner if needed.  October 28 at 4:15 PM   I have spent atleast 15 minutes non  face to face with patient today. More than 50 % of the time was spent for psychoeducation and supportive psychotherapy and care coordination. This note was generated in part or whole with voice recognition software. Voice recognition is usually quite accurate but there are transcription errors that can and very often do occur. I apologize for any typographical errors that were not detected and corrected.       Ursula Alert, MD 09/28/2019, 11:43 AM

## 2019-09-29 ENCOUNTER — Ambulatory Visit
Admission: RE | Admit: 2019-09-29 | Discharge: 2019-09-29 | Disposition: A | Discharge status: Home / Self Care | Payer: Medicare Other | Attending: Internal Medicine | Admitting: Internal Medicine

## 2019-09-29 ENCOUNTER — Encounter
Admission: RE | Disposition: A | Discharge status: Home / Self Care | Payer: Self-pay | Source: Home / Self Care | Attending: Internal Medicine

## 2019-09-29 ENCOUNTER — Ambulatory Visit: Payer: Medicare Other | Admitting: Anesthesiology

## 2019-09-29 ENCOUNTER — Other Ambulatory Visit: Payer: Self-pay | Admitting: Family

## 2019-09-29 ENCOUNTER — Encounter: Payer: Self-pay | Admitting: *Deleted

## 2019-09-29 DIAGNOSIS — K219 Gastro-esophageal reflux disease without esophagitis: Secondary | ICD-10-CM | POA: Insufficient documentation

## 2019-09-29 DIAGNOSIS — R1313 Dysphagia, pharyngeal phase: Secondary | ICD-10-CM | POA: Diagnosis not present

## 2019-09-29 DIAGNOSIS — K2270 Barrett's esophagus without dysplasia: Secondary | ICD-10-CM | POA: Insufficient documentation

## 2019-09-29 DIAGNOSIS — K2970 Gastritis, unspecified, without bleeding: Secondary | ICD-10-CM | POA: Diagnosis not present

## 2019-09-29 DIAGNOSIS — K635 Polyp of colon: Secondary | ICD-10-CM | POA: Insufficient documentation

## 2019-09-29 DIAGNOSIS — K5790 Diverticulosis of intestine, part unspecified, without perforation or abscess without bleeding: Secondary | ICD-10-CM | POA: Diagnosis not present

## 2019-09-29 DIAGNOSIS — K641 Second degree hemorrhoids: Secondary | ICD-10-CM | POA: Insufficient documentation

## 2019-09-29 DIAGNOSIS — I4821 Permanent atrial fibrillation: Secondary | ICD-10-CM | POA: Diagnosis not present

## 2019-09-29 DIAGNOSIS — K22719 Barrett's esophagus with dysplasia, unspecified: Secondary | ICD-10-CM

## 2019-09-29 DIAGNOSIS — J449 Chronic obstructive pulmonary disease, unspecified: Secondary | ICD-10-CM | POA: Insufficient documentation

## 2019-09-29 DIAGNOSIS — Z7901 Long term (current) use of anticoagulants: Secondary | ICD-10-CM | POA: Insufficient documentation

## 2019-09-29 DIAGNOSIS — M069 Rheumatoid arthritis, unspecified: Secondary | ICD-10-CM | POA: Insufficient documentation

## 2019-09-29 DIAGNOSIS — Z79899 Other long term (current) drug therapy: Secondary | ICD-10-CM | POA: Diagnosis not present

## 2019-09-29 DIAGNOSIS — K591 Functional diarrhea: Secondary | ICD-10-CM | POA: Diagnosis not present

## 2019-09-29 DIAGNOSIS — G629 Polyneuropathy, unspecified: Secondary | ICD-10-CM | POA: Diagnosis not present

## 2019-09-29 DIAGNOSIS — K648 Other hemorrhoids: Secondary | ICD-10-CM | POA: Diagnosis not present

## 2019-09-29 DIAGNOSIS — F418 Other specified anxiety disorders: Secondary | ICD-10-CM | POA: Diagnosis not present

## 2019-09-29 DIAGNOSIS — K5730 Diverticulosis of large intestine without perforation or abscess without bleeding: Secondary | ICD-10-CM | POA: Insufficient documentation

## 2019-09-29 DIAGNOSIS — Z8719 Personal history of other diseases of the digestive system: Secondary | ICD-10-CM | POA: Diagnosis not present

## 2019-09-29 DIAGNOSIS — D509 Iron deficiency anemia, unspecified: Secondary | ICD-10-CM | POA: Insufficient documentation

## 2019-09-29 DIAGNOSIS — K644 Residual hemorrhoidal skin tags: Secondary | ICD-10-CM | POA: Diagnosis not present

## 2019-09-29 DIAGNOSIS — K319 Disease of stomach and duodenum, unspecified: Secondary | ICD-10-CM | POA: Diagnosis not present

## 2019-09-29 DIAGNOSIS — Z87891 Personal history of nicotine dependence: Secondary | ICD-10-CM | POA: Insufficient documentation

## 2019-09-29 DIAGNOSIS — K210 Gastro-esophageal reflux disease with esophagitis: Secondary | ICD-10-CM | POA: Diagnosis not present

## 2019-09-29 HISTORY — PX: COLONOSCOPY WITH PROPOFOL: SHX5780

## 2019-09-29 HISTORY — PX: ESOPHAGOGASTRODUODENOSCOPY (EGD) WITH PROPOFOL: SHX5813

## 2019-09-29 LAB — HM COLONOSCOPY

## 2019-09-29 SURGERY — ESOPHAGOGASTRODUODENOSCOPY (EGD) WITH PROPOFOL
Anesthesia: General

## 2019-09-29 MED ORDER — GLYCOPYRROLATE 0.2 MG/ML IJ SOLN
INTRAMUSCULAR | Status: DC | PRN
  Administered 2019-09-29: 0.2 mg via INTRAVENOUS

## 2019-09-29 MED ORDER — LIDOCAINE HCL (CARDIAC) PF 100 MG/5ML IV SOSY
PREFILLED_SYRINGE | INTRAVENOUS | Status: DC | PRN
  Administered 2019-09-29: 100 mg via INTRAVENOUS

## 2019-09-29 MED ORDER — SODIUM CHLORIDE 0.9 % IV SOLN
INTRAVENOUS | Status: DC
  Administered 2019-09-29: 1000 mL via INTRAVENOUS

## 2019-09-29 MED ORDER — PROPOFOL 500 MG/50ML IV EMUL
INTRAVENOUS | Status: DC | PRN
  Administered 2019-09-29: 100 ug/kg/min via INTRAVENOUS

## 2019-09-29 MED ORDER — PROPOFOL 10 MG/ML IV BOLUS
INTRAVENOUS | Status: DC | PRN
  Administered 2019-09-29: 50 mg via INTRAVENOUS
  Administered 2019-09-29 (×2): 30 mg via INTRAVENOUS

## 2019-09-29 MED ORDER — LIDOCAINE HCL (PF) 2 % IJ SOLN
INTRAMUSCULAR | Status: AC
  Filled 2019-09-29: qty 10

## 2019-09-29 MED ORDER — GLYCOPYRROLATE 0.2 MG/ML IJ SOLN
INTRAMUSCULAR | Status: AC
  Filled 2019-09-29: qty 1

## 2019-09-29 NOTE — Anesthesia Post-op Follow-up Note (Signed)
Anesthesia QCDR form completed.        

## 2019-09-29 NOTE — Anesthesia Preprocedure Evaluation (Addendum)
Anesthesia Evaluation  Patient identified by MRN, date of birth, ID band Patient awake    Reviewed: Allergy & Precautions, NPO status , Patient's Chart, lab work & pertinent test results  History of Anesthesia Complications Negative for: history of anesthetic complications  Airway Mallampati: II  TM Distance: >3 FB Neck ROM: Full    Dental no notable dental hx.    Pulmonary neg sleep apnea, COPD,  COPD inhaler, former smoker,    breath sounds clear to auscultation- rhonchi (-) wheezing      Cardiovascular (-) hypertension(-) CAD, (-) Past MI, (-) Cardiac Stents and (-) CABG + dysrhythmias Atrial Fibrillation  Rhythm:Regular Rate:Normal - Systolic murmurs and - Diastolic murmurs    Neuro/Psych neg Seizures PSYCHIATRIC DISORDERS Anxiety Depression negative neurological ROS     GI/Hepatic Neg liver ROS, GERD  ,  Endo/Other  negative endocrine ROSneg diabetes  Renal/GU negative Renal ROS     Musculoskeletal  (+) Arthritis , Rheumatoid disorders,    Abdominal (+) + obese,   Peds  Hematology  (+) anemia ,   Anesthesia Other Findings Past Medical History: No date: A-fib ([place]) No date: Barretts esophagus No date: Chest pain     Comment:  a. 02/2014 Myoview: Ef 50%, no ischemia. No date: Colon polyps No date: COPD (chronic obstructive pulmonary disease) (HCC) No date: Depression with anxiety No date: GERD (gastroesophageal reflux disease) No date: Neuropathy No date: Permanent atrial fibrillation     Comment:  a. s/p ablation 01/2012 in [place] by Dr. Boyd Kerbs;  b.               On sotalol & Xarelto;  c. 02/2014 Echo: EF 50-55%, mild               conc LVH, nl LA size/structure;  d. Recurrent afib 8/15 &              08/29/2014. No date: Rectal fistula No date: Rheumatoid arthritis ([place])     Comment:  On methotrexate and orencia No date: Urinary incontinence No date: Vitamin D deficiency    Reproductive/Obstetrics                            Anesthesia Physical Anesthesia Plan  ASA: III  Anesthesia Plan: General   Post-op Pain Management:    Induction: Intravenous  PONV Risk Score and Plan: 2 and Propofol infusion  Airway Management Planned: Natural Airway  Additional Equipment:   Intra-op Plan:   Post-operative Plan:   Informed Consent: I have reviewed the patients History and Physical, chart, labs and discussed the procedure including the risks, benefits and alternatives for the proposed anesthesia with the patient or authorized representative who has indicated his/her understanding and acceptance.     Dental advisory given  Plan Discussed with: CRNA and Anesthesiologist  Anesthesia Plan Comments:         Anesthesia Quick Evaluation

## 2019-09-29 NOTE — Transfer of Care (Signed)
Immediate Anesthesia Transfer of Care Note  Patient: Brandy Armstrong  Procedure(s) Performed: ESOPHAGOGASTRODUODENOSCOPY (EGD) WITH PROPOFOL (N/A ) COLONOSCOPY WITH PROPOFOL (N/A )  Patient Location: PACU and Endoscopy Unit  Anesthesia Type:General  Level of Consciousness: awake, drowsy and patient cooperative  Airway & Oxygen Therapy: Patient Spontanous Breathing  Post-op Assessment: Report given to RN and Post -op Vital signs reviewed and stable  Post vital signs: Reviewed and stable  Last Vitals:  Vitals Value Taken Time  BP 95/72 09/29/19 1406  Temp 36.2 C 09/29/19 1406  Pulse 95 09/29/19 1407  Resp 22 09/29/19 1407  SpO2 98 % 09/29/19 1407  Vitals shown include unvalidated device data.  Last Pain:  Vitals:   09/29/19 1406  TempSrc:   PainSc: 0-No pain         Complications: No apparent anesthesia complications

## 2019-09-29 NOTE — Op Note (Signed)
[facility] Gastroenterology Patient Name: Brandy Armstrong Procedure Date: 09/29/2019 12:15 PM MRN: [id number] Account #: [id number] Date of Birth: 1946/04/06 Admit Type: Outpatient Age: 73 Room: [facility] ENDO ROOM 3 Gender: Female Note Status: Finalized Procedure:            Upper GI endoscopy Indications:          Surveillance for malignancy due to personal history of                        Barrett's esophagus, Iron deficiency anemia, Pharyngeal                        phase dysphagia, Esophageal reflux Providers:            Benay Pike. Alice Reichert MD, MD Referring MD:         Yvetta Coder. Arnett (Referring MD) Medicines:            Propofol per Anesthesia Complications:        No immediate complications. Procedure:            Pre-Anesthesia Assessment:                       - The risks and benefits of the procedure and the                        sedation options and risks were discussed with the                        patient. All questions were answered and informed                        consent was obtained.                       - Patient identification and proposed procedure were                        verified prior to the procedure by the nurse. The                        procedure was verified in the procedure room.                       - ASA Grade Assessment: III - A patient with severe                        systemic disease.                       - After reviewing the risks and benefits, the patient                        was deemed in satisfactory condition to undergo the                        procedure.                       After obtaining informed consent, the endoscope was  passed under direct vision. Throughout the procedure,                        the patient's blood pressure, pulse, and oxygen                        saturations were monitored continuously. The Endoscope                        was introduced through the mouth, and  advanced to the                        third part of duodenum. The upper GI endoscopy was                        accomplished without difficulty. The patient tolerated                        the procedure well. Findings:      There were esophageal mucosal changes secondary to established       long-segment Barrett's disease present in the distal esophagus. The       maximum longitudinal extent of these mucosal changes was 4 cm in length.       Mucosa was biopsied with a cold forceps for histology in 4 quadrants at       intervals of 2 cm at 35 cm from the incisors. A total of 3 specimen       bottles were sent to pathology.      There is no endoscopic evidence of stenosis, stricture, ulcerations or       mass in the entire esophagus.      Localized mild inflammation characterized by erosions and erythema was       found in the gastric antrum. Biopsies were taken with a cold forceps for       Helicobacter pylori testing.      The cardia and gastric fundus were normal on retroflexion.      The examined duodenum was normal. Biopsies for histology were taken with       a cold forceps for evaluation of celiac disease.      The exam was otherwise without abnormality. Impression:           - Esophageal mucosal changes secondary to established                        long-segment Barrett's disease. Biopsied.                       - Gastritis. Biopsied.                       - Normal examined duodenum. Biopsied.                       - The examination was otherwise normal. Recommendation:       - Await pathology results.                       - Proceed with colonoscopy Procedure Code(s):    --- Professional ---                       [phone],  Esophagogastroduodenoscopy, flexible, transoral;                        with biopsy, single or multiple Diagnosis Code(s):    --- Professional ---                       K21.9, Gastro-esophageal reflux disease without                        esophagitis                        R13.13, Dysphagia, pharyngeal phase                       D50.9, Iron deficiency anemia, unspecified                       K29.70, Gastritis, unspecified, without bleeding                       K22.70, Barrett's esophagus without dysplasia CPT copyright 2019 American Medical Association. All rights reserved. The codes documented in this report are preliminary and upon coder review may  be revised to meet current compliance requirements. Efrain Sella MD, MD 09/29/2019 1:48:06 PM This report has been signed electronically. Number of Addenda: 0 Note Initiated On: 09/29/2019 12:15 PM Estimated Blood Loss: Estimated blood loss: none.      [facility]

## 2019-09-29 NOTE — H&P (Signed)
Outpatient short stay form Pre-procedure 09/29/2019 11:09 AM  K. Alice Reichert, M.D.  Primary Physician: Mable Paris, FNP  Reason for visit:  Iron deficiency anemia, Barrett's esophagus, Dysphagia, hx small bowel avm's.  History of present illness:  As above. No overt gastrointestinal bleeding.    No current facility-administered medications for this encounter.   Current Outpatient Medications:  .  albuterol (PROVENTIL HFA;VENTOLIN HFA) 108 (90 Base) MCG/ACT inhaler, Inhale 1-2 puffs into the lungs every 4 (four) hours as needed for wheezing or shortness of breath., Disp: 1 Inhaler, Rfl: 10 .  busPIRone (BUSPAR) 7.5 MG tablet, Take 1 tablet (7.5 mg total) by mouth 3 (three) times daily., Disp: 90 tablet, Rfl: 1 .  clonazePAM (KLONOPIN) 0.5 MG tablet, Take 1/2 tablet daily if needed for anxiety. (Patient taking differently: 0.25 mg 2 (two) times daily as needed. Take 1/2 tablet daily if needed for anxiety.), Disp: 15 tablet, Rfl: 0 .  ELIQUIS 5 MG TABS tablet, Take 1 tablet (5 mg total) by mouth 2 (two) times daily., Disp: 180 tablet, Rfl: 3 .  esomeprazole (NEXIUM) 20 MG capsule, Take 1 capsule (20 mg total) by mouth daily., Disp: 90 capsule, Rfl: 1 .  furosemide (LASIX) 20 MG tablet, Take 1 tablet (20 mg) by mouth daily, you may take 1 extra tablet (20 mg) after lunch as needed for increased swelling, weight gain, shortness of breath, Disp: 90 tablet, Rfl: 2 .  gabapentin (NEURONTIN) 600 MG tablet, TAKE 1 TABLET BY MOUTH THREE TIMES A DAY, Disp: 90 tablet, Rfl: 1 .  HUMIRA PEN 40 MG/0.4ML PNKT, Inject 40 mg as directed. Every 2 weeks, Disp: , Rfl:  .  hydroxychloroquine (PLAQUENIL) 200 MG tablet, Take 200 mg by mouth 2 (two) times daily. , Disp: , Rfl:  .  hydrOXYzine (ATARAX/VISTARIL) 50 MG tablet, TAKE 1 TABLET BY MOUTH AT BEDTIME AND IF YOU WAKE UP, MAY TAKE ADDITIONAL DOSE OF 50MG  (1 TABLET)., Disp: 180 tablet, Rfl: 2 .  lidocaine (XYLOCAINE) 5 % ointment, Apply 1 application  topically as needed., Disp: , Rfl:  .  potassium chloride (K-DUR) 10 MEQ tablet, Take 1 tablet (10 meq) by mouth once daily, take 1 extra tablet (10 meq) after lunch when taking extra lasix, Disp: 90 tablet, Rfl: 2 .  propranolol ER (INDERAL LA) 120 MG 24 hr capsule, Take 1 capsule (120 mg total) by mouth daily., Disp: 90 capsule, Rfl: 3 .  sertraline (ZOLOFT) 25 MG tablet, Take 1 tablet (25 mg total) by mouth daily with breakfast., Disp: 30 tablet, Rfl: 1 .  traMADol (ULTRAM) 50 MG tablet, Take 1 tablet (50 mg total) by mouth every 12 (twelve) hours as needed for severe pain., Disp: 30 tablet, Rfl: 0 .  TRELEGY ELLIPTA 100-62.5-25 MCG/INH AEPB, TAKE 1 PUFF BY MOUTH EVERY DAY, Disp: 60 each, Rfl: 2  No medications prior to admission.     Allergies  Allergen Reactions  . Latex     Itching Itching     Past Medical History:  Diagnosis Date  . A-fib ([place])   . Barretts esophagus   . Chest pain    a. 02/2014 Myoview: Ef 50%, no ischemia.  . Colon polyps   . COPD (chronic obstructive pulmonary disease) ([place])   . Depression with anxiety   . GERD (gastroesophageal reflux disease)   . Neuropathy   . Permanent atrial fibrillation    a. s/p ablation 01/2012 in [facility] by Dr. Boyd Kerbs;  b. On sotalol & Xarelto;  c. 02/2014 Echo:  EF 50-55%, mild conc LVH, nl LA size/structure;  d. Recurrent afib 8/15 & 08/29/2014.  [name] Rectal fistula   . Rheumatoid arthritis ([place])    On methotrexate and orencia  . Urinary incontinence   . Vitamin D deficiency     Review of systems:  Otherwise negative.    Physical Exam  Gen: Alert, oriented. Appears stated age.  HEENT: [place]/AT. PERRLA. Lungs: CTA, no wheezes. CV: RR nl S1, S2. Abd: soft, benign, no masses. BS+ Ext: No edema. Pulses 2+    Planned procedures: Proceed with EGD and colonoscopy. The patient understands the nature of the planned procedure, indications, risks, alternatives and potential complications including but not limited to bleeding,  infection, perforation, damage to internal organs and possible oversedation/side effects from anesthesia. The patient agrees and gives consent to proceed.  Please refer to procedure notes for findings, recommendations and patient disposition/instructions.      K. Alice Reichert, M.D. Gastroenterology 09/29/2019  11:09 AM

## 2019-09-29 NOTE — Interval H&P Note (Signed)
History and Physical Interval Note:  09/29/2019 1:25 PM  Brandy Armstrong  has presented today for surgery, with the diagnosis of ANEMIA.  The various methods of treatment have been discussed with the patient and family. After consideration of risks, benefits and other options for treatment, the patient has consented to  Procedure(s): ESOPHAGOGASTRODUODENOSCOPY (EGD) WITH PROPOFOL (N/A) COLONOSCOPY WITH PROPOFOL (N/A) as a surgical intervention.  The patient's history has been reviewed, patient examined, no change in status, stable for surgery.  I have reviewed the patient's chart and labs.  Questions were answered to the patient's satisfaction.     [place], [place]

## 2019-09-29 NOTE — Op Note (Signed)
[facility] Gastroenterology Patient Name: Brandy Armstrong Procedure Date: 09/29/2019 12:19 PM MRN: [id number] Account #: [id number] Date of Birth: 1946-04-22 Admit Type: Outpatient Age: 73 Room: [facility] ENDO ROOM 3 Gender: Female Note Status: Finalized Procedure:            Colonoscopy Indications:          Functional diarrhea, Iron deficiency anemia Providers:            Benay Pike. Alice Reichert MD, MD Referring MD:         Yvetta Coder. Arnett (Referring MD) Medicines:            Propofol per Anesthesia Complications:        No immediate complications. Procedure:            Pre-Anesthesia Assessment:                       - The risks and benefits of the procedure and the                        sedation options and risks were discussed with the                        patient. All questions were answered and informed                        consent was obtained.                       - Patient identification and proposed procedure were                        verified prior to the procedure by the nurse. The                        procedure was verified in the procedure room.                       - ASA Grade Assessment: III - A patient with severe                        systemic disease.                       - After reviewing the risks and benefits, the patient                        was deemed in satisfactory condition to undergo the                        procedure.                       After obtaining informed consent, the colonoscope was                        passed under direct vision. Throughout the procedure,                        the patient's blood pressure, pulse, and oxygen  saturations were monitored continuously. The                        Colonoscope was introduced through the anus and                        advanced to the the cecum, identified by appendiceal                        orifice and ileocecal valve. The colonoscopy was                 performed without difficulty. The patient tolerated the                        procedure well. The quality of the bowel preparation                        was adequate. The ileocecal valve, appendiceal orifice,                        and rectum were photographed. Findings:      The perianal exam findings include internal hemorrhoids that prolapse       with straining, but spontaneously regress to the resting position (Grade       II).      The digital rectal exam was normal. Pertinent negatives include normal       sphincter tone.      Many small and large-mouthed diverticula were found in the entire colon.      A 3 mm polyp was found in the ascending colon. The polyp was sessile.       The polyp was removed with a jumbo cold forceps. Resection and retrieval       were complete.      Non-bleeding internal hemorrhoids were found during retroflexion. The       hemorrhoids were Grade II (internal hemorrhoids that prolapse but reduce       spontaneously).      The exam was otherwise without abnormality.      Biopsies for histology were taken with a cold forceps from the random       colon for evaluation of microscopic colitis.      The exam was otherwise without abnormality. Impression:           - Internal hemorrhoids that prolapse with straining,                        but spontaneously regress to the resting position                        (Grade II) found on perianal exam.                       - Diverticulosis in the entire examined colon.                       - One 3 mm polyp in the ascending colon, removed with a                        jumbo cold forceps. Resected and retrieved.                       -  Non-bleeding internal hemorrhoids.                       - The examination was otherwise normal.                       - The examination was otherwise normal.                       - Biopsies were taken with a cold forceps from the                        random colon  for evaluation of microscopic colitis. Recommendation:       - Await pathology results from EGD, also performed                        today.                       - Patient has a contact number available for                        emergencies. The signs and symptoms of potential                        delayed complications were discussed with the patient.                        Return to normal activities tomorrow. Written discharge                        instructions were provided to the patient.                       - Resume previous diet.                       - Continue present medications.                       - Resume Eliquis (apixaban) at prior dose today. Refer                        to managing physician for further adjustment of therapy.                       - Return to physician assistant in 6 weeks.                       - The findings and recommendations were discussed with                        the patient. Procedure Code(s):    --- Professional ---                       [id number], Colonoscopy, flexible; with biopsy, single or                        multiple Diagnosis Code(s):    --- Professional ---  K64.1, Second degree hemorrhoids                       K63.5, Polyp of colon                       K59.1, Functional diarrhea                       D50.9, Iron deficiency anemia, unspecified                       K57.30, Diverticulosis of large intestine without                        perforation or abscess without bleeding CPT copyright 2019 [employer]. All rights reserved. The codes documented in this report are preliminary and upon coder review may  be revised to meet current compliance requirements. Efrain Sella MD, MD 09/29/2019 2:06:58 PM This report has been signed electronically. Number of Addenda: 0 Note Initiated On: 09/29/2019 12:19 PM Scope Withdrawal Time: 0 hours 6 minutes 10 seconds  Total Procedure Duration: 0  hours 10 minutes 39 seconds  Estimated Blood Loss: Estimated blood loss: none.      [facility]

## 2019-10-01 ENCOUNTER — Ambulatory Visit (INDEPENDENT_AMBULATORY_CARE_PROVIDER_SITE_OTHER): Payer: Medicare Other | Admitting: Psychology

## 2019-10-01 ENCOUNTER — Encounter: Payer: Self-pay | Admitting: Internal Medicine

## 2019-10-01 DIAGNOSIS — F3289 Other specified depressive episodes: Secondary | ICD-10-CM

## 2019-10-01 DIAGNOSIS — F418 Other specified anxiety disorders: Secondary | ICD-10-CM | POA: Diagnosis not present

## 2019-10-01 NOTE — Anesthesia Postprocedure Evaluation (Signed)
Anesthesia Post Note  Patient: Brandy Armstrong  Procedure(s) Performed: ESOPHAGOGASTRODUODENOSCOPY (EGD) WITH PROPOFOL (N/A ) COLONOSCOPY WITH PROPOFOL (N/A )  Patient location during evaluation: Endoscopy Anesthesia Type: General Level of consciousness: awake and alert and oriented Pain management: pain level controlled Vital Signs Assessment: post-procedure vital signs reviewed and stable Respiratory status: spontaneous breathing, nonlabored ventilation and respiratory function stable Cardiovascular status: blood pressure returned to baseline and stable Postop Assessment: no signs of nausea or vomiting Anesthetic complications: no     Last Vitals:  Vitals:   09/29/19 1425 09/29/19 1433  BP:  (!) 130/96  Pulse: 100 93  Resp: 20 20  Temp:    SpO2: 98% 99%    Last Pain:  Vitals:   09/29/19 1433  TempSrc:   PainSc: 0-No pain                 Vashon Arch

## 2019-10-04 ENCOUNTER — Other Ambulatory Visit: Payer: Self-pay | Admitting: Student

## 2019-10-04 DIAGNOSIS — K8689 Other specified diseases of pancreas: Secondary | ICD-10-CM

## 2019-10-04 DIAGNOSIS — K529 Noninfective gastroenteritis and colitis, unspecified: Secondary | ICD-10-CM

## 2019-10-05 LAB — SURGICAL PATHOLOGY

## 2019-10-06 ENCOUNTER — Other Ambulatory Visit: Payer: Self-pay | Admitting: Psychiatry

## 2019-10-06 DIAGNOSIS — F410 Panic disorder [episodic paroxysmal anxiety] without agoraphobia: Secondary | ICD-10-CM

## 2019-10-06 DIAGNOSIS — F411 Generalized anxiety disorder: Secondary | ICD-10-CM

## 2019-10-06 DIAGNOSIS — F331 Major depressive disorder, recurrent, moderate: Secondary | ICD-10-CM

## 2019-10-10 ENCOUNTER — Other Ambulatory Visit: Payer: Self-pay | Admitting: Student

## 2019-10-10 ENCOUNTER — Other Ambulatory Visit: Payer: Self-pay

## 2019-10-10 ENCOUNTER — Ambulatory Visit
Admission: RE | Admit: 2019-10-10 | Discharge: 2019-10-10 | Disposition: A | Discharge status: Home / Self Care | Payer: Medicare Other | Source: Ambulatory Visit | Attending: Student | Admitting: Student

## 2019-10-10 DIAGNOSIS — K529 Noninfective gastroenteritis and colitis, unspecified: Secondary | ICD-10-CM

## 2019-10-10 DIAGNOSIS — K8689 Other specified diseases of pancreas: Secondary | ICD-10-CM

## 2019-10-10 DIAGNOSIS — K5730 Diverticulosis of large intestine without perforation or abscess without bleeding: Secondary | ICD-10-CM | POA: Diagnosis not present

## 2019-10-10 LAB — POCT I-STAT CREATININE: Creatinine, Ser: 0.9 mg/dL (ref 0.44–1.00)

## 2019-10-10 MED ORDER — GADOBUTROL 1 MMOL/ML IV SOLN
9.0000 mL | Freq: Once | INTRAVENOUS | Status: AC | PRN
  Administered 2019-10-10: 9 mL via INTRAVENOUS

## 2019-10-15 DIAGNOSIS — K8689 Other specified diseases of pancreas: Secondary | ICD-10-CM | POA: Diagnosis not present

## 2019-10-15 DIAGNOSIS — K22719 Barrett's esophagus with dysplasia, unspecified: Secondary | ICD-10-CM | POA: Diagnosis not present

## 2019-10-15 DIAGNOSIS — D509 Iron deficiency anemia, unspecified: Secondary | ICD-10-CM | POA: Diagnosis not present

## 2019-10-18 ENCOUNTER — Ambulatory Visit: Payer: Medicare Other

## 2019-10-19 ENCOUNTER — Ambulatory Visit: Payer: Medicare Other | Admitting: Psychology

## 2019-10-27 ENCOUNTER — Other Ambulatory Visit: Payer: Self-pay

## 2019-10-27 ENCOUNTER — Ambulatory Visit (INDEPENDENT_AMBULATORY_CARE_PROVIDER_SITE_OTHER): Payer: Medicare Other | Admitting: Psychiatry

## 2019-10-27 ENCOUNTER — Encounter: Payer: Self-pay | Admitting: Psychiatry

## 2019-10-27 DIAGNOSIS — F410 Panic disorder [episodic paroxysmal anxiety] without agoraphobia: Secondary | ICD-10-CM

## 2019-10-27 DIAGNOSIS — F411 Generalized anxiety disorder: Secondary | ICD-10-CM | POA: Diagnosis not present

## 2019-10-27 DIAGNOSIS — F09 Unspecified mental disorder due to known physiological condition: Secondary | ICD-10-CM

## 2019-10-27 DIAGNOSIS — F331 Major depressive disorder, recurrent, moderate: Secondary | ICD-10-CM | POA: Diagnosis not present

## 2019-10-27 MED ORDER — HYDROXYZINE HCL 50 MG PO TABS: 25.0000 mg | ORAL_TABLET | Freq: Every day | ORAL | 0 refills | Status: DC | PRN

## 2019-10-27 MED ORDER — TRAZODONE HCL 50 MG PO TABS: 25.0000 mg | ORAL_TABLET | Freq: Every evening | ORAL | 1 refills | Status: DC | PRN

## 2019-10-27 MED ORDER — SERTRALINE HCL 50 MG PO TABS: 50.0000 mg | ORAL_TABLET | Freq: Every day | ORAL | 0 refills | Status: DC

## 2019-10-27 NOTE — Progress Notes (Signed)
Virtual Visit via Telephone Note  I connected with Brandy Armstrong on 10/27/19 at  4:15 PM EDT by telephone and verified that I am speaking with the correct person using two identifiers.   I discussed the limitations, risks, security and privacy concerns of performing an evaluation and management service by telephone and the availability of in person appointments. I also discussed with the patient that there may be a patient responsible charge related to this service. The patient expressed understanding and agreed to proceed.    I discussed the assessment and treatment plan with the patient. The patient was provided an opportunity to ask questions and all were answered. The patient agreed with the plan and demonstrated an understanding of the instructions.   The patient was advised to call back or seek an in-person evaluation if the symptoms worsen or if the condition fails to improve as anticipated.   [place] MD OP Progress Note  10/27/2019 1:46 PM Brandy Armstrong  MRN:  [id number]  Chief Complaint:  Chief Complaint    Follow-up     HPI: Brandy Armstrong is a 73 year old Caucasian female, retired, single, lives in [place], has a history of depression, anxiety, panic attacks, atrial fibrillation, peripheral artery disease, gastroesophageal reflux disease, coronary artery disease, neuropathy, memory problems, history of encephalopathy was evaluated by phone today.  Patient preferred to do a phone call.  Patient reports she is currently tolerating the Zoloft well.  She denies any side effects.  She however continues to struggle with depression and anxiety symptoms.  She reports she struggles with sadness, anhedonia, sleep problems.  She reports she tried taking hydroxyzine which was prescribed by her primary care provider for sleep.  However it causes a lingering effect in the morning and she does not like it.  She reports she was recently diagnosed with pancreatic insufficiency and was started on Creon.   She reports she will continue to work with her providers on the same.  She continues to follow-up with her therapist-therapist Alene Mires.  Patient reports she feels bored often since she is socially isolated. Due to the pandemic there is not a lot of things she can do.  Patient denies any suicidality, homicidality or perceptual disturbances.  Patient appeared to be alert, oriented to person place time and situation. Visit Diagnosis:    ICD-10-CM   1. MDD (major depressive disorder), recurrent episode, moderate (HCC)  F33.1 sertraline (ZOLOFT) 50 MG tablet    traZODone (DESYREL) 50 MG tablet  2. GAD (generalized anxiety disorder)  F41.1 sertraline (ZOLOFT) 50 MG tablet  3. Panic attacks  F41.0 hydrOXYzine (ATARAX/VISTARIL) 50 MG tablet    sertraline (ZOLOFT) 50 MG tablet  4. Cognitive disorder  F09    likely mild    Past Psychiatric History: I have reviewed past psychiatric history from my progress note on 08/18/2019.  Past trials of Zoloft, Prozac, Lexapro, Xanax, Klonopin  Past Medical History:  Past Medical History:  Diagnosis Date  . A-fib ([place])   . Barretts esophagus   . Chest pain    a. 02/2014 Myoview: Ef 50%, no ischemia.  . Colon polyps   . COPD (chronic obstructive pulmonary disease) ([place])   . Depression with anxiety   . GERD (gastroesophageal reflux disease)   . Neuropathy   . Permanent atrial fibrillation [facility])    a. s/p ablation 01/2012 in [facility] by Dr. Boyd Kerbs;  b. On sotalol & Xarelto;  c. 02/2014 Echo: EF 50-55%, mild conc LVH, nl LA size/structure;  d. Recurrent  afib 8/15 & 08/29/2014.  [name] Rectal fistula   . Rheumatoid arthritis ([place])    On methotrexate and orencia  . Urinary incontinence   . Vitamin D deficiency     Past Surgical History:  Procedure Laterality Date  . ABDOMINAL HYSTERECTOMY  1992  . APPENDECTOMY    . CARDIAC ELECTROPHYSIOLOGY STUDY AND ABLATION  2013  . CHOLECYSTECTOMY  1987  . COLONOSCOPY N/A 05/08/2015   Procedure: COLONOSCOPY;  Surgeon:  Manya Silvas, MD;  Location: [facility] ENDOSCOPY;  Service: Endoscopy;  Laterality: N/A;  . COLONOSCOPY WITH PROPOFOL N/A 09/29/2019   Procedure: COLONOSCOPY WITH PROPOFOL;  Surgeon: Toledo, Benay Pike, MD;  Location: [facility] ENDOSCOPY;  Service: Gastroenterology;  Laterality: N/A;  . ESOPHAGOGASTRODUODENOSCOPY N/A 05/06/2015   Procedure: ESOPHAGOGASTRODUODENOSCOPY (EGD);  Surgeon: Lollie Sails, MD;  Location[facility] ENDOSCOPY;  Service: Endoscopy;  Laterality: N/A;  . ESOPHAGOGASTRODUODENOSCOPY (EGD) WITH PROPOFOL N/A 09/29/2019   Procedure: ESOPHAGOGASTRODUODENOSCOPY (EGD) WITH PROPOFOL;  Surgeon: Toledo, Benay Pike, MD;  Location: [facility] ENDOSCOPY;  Service: Gastroenterology;  Laterality: N/A;  . LAPAROSCOPIC APPENDECTOMY N/A 02/19/2019   Procedure: APPENDECTOMY LAPAROSCOPIC converted to open appendectomy;  Surgeon: Herbert Pun, MD;  Location: [facility] ORS;  Service: General;  Laterality: N/A;  . OOPHORECTOMY    . oophrectomy Bilateral 1992    Family Psychiatric History: I have reviewed family psychiatric history from my progress note on 08/18/2019.  Family History:  Family History  Problem Relation Age of Onset  . Depression Mother   . Pancreatic cancer Mother   . Suicidality Mother   . Colon cancer Father   . Breast cancer Sister 41  . Diabetes Brother   . Breast cancer Cousin   . Neuropathy Neg Hx     Social History: I have reviewed social history from my progress note on 08/18/2019. Social History   Socioeconomic History  . Marital status: Single    Spouse name: Not on file  . Number of children: 0  . Years of education: 16  . Highest education level: Bachelor's degree (e.g., BA, AB, BS)  Occupational History  . Occupation: Retired  Scientific laboratory technician  . Financial resource strain: Somewhat hard  . Food insecurity    Worry: Sometimes true    Inability: Sometimes true  . Transportation needs    Medical: No    Non-medical: No  Tobacco Use  . Smoking status: Former Smoker     Packs/day: 1.00    Years: 40.00    Pack years: 40.00    Quit date: 12/16/2011    Years since quitting: 7.8  . Smokeless tobacco: Never Used  Substance and Sexual Activity  . Alcohol use: Not Currently    Alcohol/week: 0.0 standard drinks    Comment: Occasionally has a drink  . Drug use: No  . Sexual activity: Never  Lifestyle  . Physical activity    Days per week: 0 days    Minutes per session: 0 min  . Stress: To some extent  Relationships  . Social Herbalist on phone: Not on file    Gets together: Not on file    Attends religious service: More than 4 times per year    Active member of club or organization: No    Attends meetings of clubs or organizations: Never    Relationship status: Never married  Other Topics Concern  . Not on file  Social History Narrative   Ms. Wehrli was born and reared in [place]. She attended [employer] and graduated in  1971 with her Bachelors in Education. 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 She then worked for a [employer] in [place]. She is single. She is currently retired. She loves reading. She loves to spend time with her dog.       Caffeine use: 2 cups coffee per day   2 glasses iced tea/ day   Right-handed    Allergies:  Allergies  Allergen Reactions  . Latex     Itching Itching    Metabolic Disorder Labs: Lab Results  Component Value Date   HGBA1C 5.5 10/12/2018   No results found for: PROLACTIN Lab Results  Component Value Date   CHOL 155 10/12/2018   TRIG 3,560 (H) 02/19/2019   HDL 38.90 (L) 10/12/2018   CHOLHDL 4 10/12/2018   VLDL 35.8 10/12/2018   LDLCALC 80 10/12/2018   LDLCALC 84 09/24/2016   Lab Results  Component Value Date   TSH 3.25 05/12/2019   TSH 4.70 (H) 04/15/2019    Therapeutic Level Labs: No results found for: LITHIUM No results found for: VALPROATE No components found for:  CBMZ  Current Medications: Current  Outpatient Medications  Medication Sig Dispense Refill  . albuterol (PROVENTIL HFA;VENTOLIN HFA) 108 (90 Base) MCG/ACT inhaler Inhale 1-2 puffs into the lungs every 4 (four) hours as needed for wheezing or shortness of breath. 1 Inhaler 10  . busPIRone (BUSPAR) 7.5 MG tablet Take 1 tablet (7.5 mg total) by mouth 3 (three) times daily. 90 tablet 1  . clonazePAM (KLONOPIN) 0.5 MG tablet Take 1/2 tablet daily if needed for anxiety. (Patient taking differently: 0.25 mg 2 (two) times daily as needed. Take 1/2 tablet daily if needed for anxiety.) 15 tablet 0  . CREON 36000 units CPEP capsule TAKE 1 CAPSULE BY MOUTH WITH SNACKS FOR UP TO 14 DAYS TAKE WITH FIRST BITES OF MEAL.    [name] ELIQUIS 5 MG TABS tablet Take 1 tablet (5 mg total) by mouth 2 (two) times daily. 180 tablet 3  . esomeprazole (NEXIUM) 20 MG capsule TAKE 1 CAPSULE BY MOUTH EVERY DAY 90 capsule 1  . furosemide (LASIX) 20 MG tablet Take 1 tablet (20 mg) by mouth daily, you may take 1 extra tablet (20 mg) after lunch as needed for increased swelling, weight gain, shortness of breath 90 tablet 2  . gabapentin (NEURONTIN) 600 MG tablet TAKE 1 TABLET BY MOUTH THREE TIMES A DAY 90 tablet 1  . HUMIRA PEN 40 MG/0.4ML PNKT Inject 40 mg as directed. Every 2 weeks    . hydroxychloroquine (PLAQUENIL) 200 MG tablet Take 200 mg by mouth 2 (two) times daily.     . hydrOXYzine (ATARAX/VISTARIL) 50 MG tablet Take 0.5-1 tablets (25-50 mg total) by mouth daily as needed for anxiety. Patient has supplies 90 tablet 0  . lidocaine (XYLOCAINE) 5 % ointment Apply 1 application topically as needed.    . lipase/protease/amylase (CREON) 36000 UNITS CPEP capsule Take by mouth.    . potassium chloride (K-DUR) 10 MEQ tablet Take 1 tablet (10 meq) by mouth once daily, take 1 extra tablet (10 meq) after lunch when taking extra lasix 90 tablet 2  . propranolol ER (INDERAL LA) 120 MG 24 hr capsule Take 1 capsule (120 mg total) by mouth daily. 90 capsule 3  . TRELEGY ELLIPTA  100-62.5-25 MCG/INH AEPB TAKE 1 PUFF BY MOUTH EVERY DAY 60 each 2  . GAVILYTE-N WITH FLAVOR PACK 420 g solution TAKE 4,000  MLS BY MOUTH AS DIRECTED TAKE AS DIRECTED FOR COLON PREP.    [name] sertraline (ZOLOFT) 50 MG tablet Take 1 tablet (50 mg total) by mouth daily. 90 tablet 0  . traMADol (ULTRAM) 50 MG tablet Take 1 tablet (50 mg total) by mouth every 12 (twelve) hours as needed for severe pain. (Patient not taking: Reported on 10/27/2019) 30 tablet 0  . traZODone (DESYREL) 50 MG tablet Take 0.5 tablets (25 mg total) by mouth at bedtime as needed for sleep. 15 tablet 1  . ZENPEP 25000-79000 units CPEP TAKE 1 CAPSULE WITH FIRST BITE OF MEALS AND SNACKS. INCREASE TO 2 CAPSULES IF NEEDED.     No current facility-administered medications for this visit.      Musculoskeletal: Strength & Muscle Tone: UTA Gait & Station: Walks with walker Patient leans: N/A  Psychiatric Specialty Exam: Review of Systems  Psychiatric/Behavioral: Positive for depression. The patient has insomnia.   All other systems reviewed and are negative.   There were no vitals taken for this visit.There is no height or weight on file to calculate BMI.  General Appearance: UTA  Eye Contact:  UTA  Speech:  Clear and Coherent  Volume:  Normal  Mood:  Depressed  Affect:  UTA  Thought Process:  Goal Directed and Descriptions of Associations: Intact  Orientation:  Full (Time, Place, and Person)  Thought Content: Logical   Suicidal Thoughts:  No  Homicidal Thoughts:  No  Memory:  Immediate;   Fair Recent;   Fair Remote;   Limited  Judgement:  Fair  Insight:  Fair  Psychomotor Activity:  UTA  Concentration:  Concentration: Fair and Attention Span: Fair  Recall:  [employer] of Knowledge: Fair  Language: Fair  Akathisia:  No  Handed:  Right  AIMS (if indicated): Denies tremors, rigidity  Assets:  Communication Skills Desire for Improvement Housing  ADL's:  Intact  Cognition: WNL  Sleep:  Poor    Screenings: Mini-Mental     Clinical Support from 08/01/2016 in [facility]  Total Score (max 30 points )  30    PHQ2-9     Office Visit from 07/16/2019 in [facility] Office Visit from 12/09/2018 in [place] Office Visit from 01/28/2017 in [place] from 08/01/2016 in [place] Office Visit from 01/25/2015 in [place]  PHQ-2 Total Score  0  6  0  0  0  PHQ-9 Total Score  -  21  -  -  -       Assessment and Plan: Ayani is a 73 year old Caucasian female who has a history of depression, anxiety, atrial fibrillation, gastroesophageal reflux disease, neuropathy, coronary artery disease, history of encephalopathy, memory changes was evaluated by phone today.  Patient is biologically predisposed given her family history as well as multiple medical problems.  She also has psychosocial stressors of being single, health problems.  Patient continues to struggle with depression and sleep problems.  She will benefit from medication readjustment.  Plan MDD-some progress Increase Zoloft to 50 mg p.o. daily BuSpar 7.5 mg p.o. 3 times daily  GAD-improving Continue psychotherapy sessions with her therapist Mr. Buena Irish Continue Zoloft. She does have Klonopin available which she takes for neuropathy-prescribed by neurologist.  She has been limiting use and is aware about the long-term risk of benzodiazepine therapy and its effect on cognitive function.  Insomnia-unstable Reduce hydroxyzine to 25 mg as needed for anxiety  symptoms since she does not like the effect when she takes the 50 mg at bedtime We will start trazodone 25 mg p.o. nightly as needed for sleep   Panic attacks-stable We will continue to monitor closely.  Cognitive disorder-unspecified-patient does report memory problems especially remote memory.  She will continue to work with her  neurologist.  Patient appeared to be alert oriented and was able to answer questions appropriately today.  Follow-up in clinic in 1 month or sooner if needed.  November 30th at 1 PM  I have spent atleast 15 minutes non face to face with patient today. More than 50 % of the time was spent for psychoeducation and supportive psychotherapy and care coordination. This note was generated in part or whole with voice recognition software. Voice recognition is usually quite accurate but there are transcription errors that can and very often do occur. I apologize for any typographical errors that were not detected and corrected.         Ursula Alert, MD 10/27/2019, 1:46 PM

## 2019-11-02 ENCOUNTER — Ambulatory Visit (INDEPENDENT_AMBULATORY_CARE_PROVIDER_SITE_OTHER): Payer: Medicare Other | Admitting: Psychology

## 2019-11-02 DIAGNOSIS — F3289 Other specified depressive episodes: Secondary | ICD-10-CM | POA: Diagnosis not present

## 2019-11-02 DIAGNOSIS — F418 Other specified anxiety disorders: Secondary | ICD-10-CM

## 2019-11-03 ENCOUNTER — Other Ambulatory Visit: Payer: Self-pay | Admitting: Cardiovascular Disease

## 2019-11-03 ENCOUNTER — Other Ambulatory Visit: Payer: Self-pay | Admitting: Psychiatry

## 2019-11-03 DIAGNOSIS — F331 Major depressive disorder, recurrent, moderate: Secondary | ICD-10-CM

## 2019-11-03 DIAGNOSIS — F410 Panic disorder [episodic paroxysmal anxiety] without agoraphobia: Secondary | ICD-10-CM

## 2019-11-03 DIAGNOSIS — F411 Generalized anxiety disorder: Secondary | ICD-10-CM

## 2019-11-04 ENCOUNTER — Other Ambulatory Visit: Payer: Self-pay | Admitting: Family

## 2019-11-04 DIAGNOSIS — G629 Polyneuropathy, unspecified: Secondary | ICD-10-CM

## 2019-11-18 ENCOUNTER — Other Ambulatory Visit: Payer: Self-pay | Admitting: Psychiatry

## 2019-11-18 DIAGNOSIS — F331 Major depressive disorder, recurrent, moderate: Secondary | ICD-10-CM

## 2019-11-23 ENCOUNTER — Telehealth: Payer: Self-pay | Admitting: Pulmonary Disease

## 2019-11-23 MED ORDER — TRELEGY ELLIPTA 100-62.5-25 MCG/INH IN AEPB: 1.0000 | INHALATION_SPRAY | Freq: Every day | RESPIRATORY_TRACT | 2 refills | Status: DC

## 2019-11-23 NOTE — Telephone Encounter (Signed)
Spoke to pt, who is requesting refill on Trelegy.  Pt is past due for ROV. Advised pt that I could send in one month supply, however she would need an OV for additional refills.  Pt stated that she would call back after the holidays to schedule an OV.  Rx for trelegy has been sent to preferred pharmacy.   Nothing further is needed.

## 2019-11-28 NOTE — Progress Notes (Signed)
Virtual Visit via Telephone Note  I connected with Brandy Armstrong on 11/29/19 at  1:00 PM EST by telephone and verified that I am speaking with the correct person using two identifiers.   I discussed the limitations, risks, security and privacy concerns of performing an evaluation and management service by telephone and the availability of in person appointments. I also discussed with the patient that there may be a patient responsible charge related to this service. The patient expressed understanding and agreed to proceed.     I discussed the assessment and treatment plan with the patient. The patient was provided an opportunity to ask questions and all were answered. The patient agreed with the plan and demonstrated an understanding of the instructions.   The patient was advised to call back or seek an in-person evaluation if the symptoms worsen or if the condition fails to improve as anticipated.   [place] MD OP Progress Note  11/29/2019 1:08 PM WANDER Armstrong  MRN:  [id number]  Chief Complaint:  Chief Complaint    Follow-up     HPI: Brandy Armstrong is a 73 year old Caucasian female, retired, single, lives in [place], has a history of depression, anxiety, panic attacks, atrial fibrillation, peripheral artery disease, gastroesophageal reflux disease, coronary artery disease, neuropathy, memory problems, history of encephalopathy was evaluated by phone today.  Patient preferred to do a phone call.  Patient today reports she is doing well with regards to her mood symptoms.  Denies any significant sadness.  Her anhedonia has improved.  She does not feel anxious as she used to before.  Sleep has improved.  She is compliant on medications as prescribed.  She takes BuSpar and Zoloft and denies side effects.  Patient reports she did not make another appointment with her therapist since she did not feel comfortable.  She however reports she is making progress with regards to her mood symptoms.  She had  a good Thanksgiving holiday which she spent with her cousin.  She denies any suicidality, homicidality or perceptual disturbances.  Patient appeared to be alert, oriented to person place time and situation.  Patient denies any other concerns today.     Visit Diagnosis:    ICD-10-CM   1. MDD (major depressive disorder), recurrent, in partial remission (HCC)  F33.41 busPIRone (BUSPAR) 7.5 MG tablet  2. GAD (generalized anxiety disorder)  F41.1 busPIRone (BUSPAR) 7.5 MG tablet   stable  3. Panic attacks  F41.0   4. Cognitive disorder  F09     Past Psychiatric History: I have reviewed past psychiatric history from my progress note on 08/18/2019.  Past trials of Zoloft, Prozac, Lexapro, Klonopin  Past Medical History:  Past Medical History:  Diagnosis Date  . A-fib ([place])   . Barretts esophagus   . Chest pain    a. 02/2014 Myoview: Ef 50%, no ischemia.  . Colon polyps   . COPD (chronic obstructive pulmonary disease) ([place])   . Depression with anxiety   . GERD (gastroesophageal reflux disease)   . Hx of benign essential tremor   . Neuropathy   . Permanent atrial fibrillation [facility])    a. s/p ablation 01/2012 in [facility] by Dr. Boyd Kerbs;  b. On sotalol & Xarelto;  c. 02/2014 Echo: EF 50-55%, mild conc LVH, nl LA size/structure;  d. Recurrent afib 8/15 & 08/29/2014.  [name] Rectal fistula   . Rheumatoid arthritis ([place])    On methotrexate and orencia  . Urinary incontinence   . Vitamin D deficiency  Past Surgical History:  Procedure Laterality Date  . ABDOMINAL HYSTERECTOMY  1992  . APPENDECTOMY    . CARDIAC ELECTROPHYSIOLOGY STUDY AND ABLATION  2013  . CHOLECYSTECTOMY  1987  . COLONOSCOPY N/A 05/08/2015   Procedure: COLONOSCOPY;  Surgeon: Manya Silvas, MD;  Location: [facility] ENDOSCOPY;  Service: Endoscopy;  Laterality: N/A;  . COLONOSCOPY WITH PROPOFOL N/A 09/29/2019   Procedure: COLONOSCOPY WITH PROPOFOL;  Surgeon: Toledo, Benay Pike, MD;  Location: [facility] ENDOSCOPY;  Service:  Gastroenterology;  Laterality: N/A;  . ESOPHAGOGASTRODUODENOSCOPY N/A 05/06/2015   Procedure: ESOPHAGOGASTRODUODENOSCOPY (EGD);  Surgeon: Lollie Sails, MD;  Location: [facility] ENDOSCOPY;  Service: Endoscopy;  Laterality: N/A;  . ESOPHAGOGASTRODUODENOSCOPY (EGD) WITH PROPOFOL N/A 09/29/2019   Procedure: ESOPHAGOGASTRODUODENOSCOPY (EGD) WITH PROPOFOL;  Surgeon: Toledo, Benay Pike, MD;  Location: [facility] ENDOSCOPY;  Service: Gastroenterology;  Laterality: N/A;  . LAPAROSCOPIC APPENDECTOMY N/A 02/19/2019   Procedure: APPENDECTOMY LAPAROSCOPIC converted to open appendectomy;  Surgeon: Herbert Pun, MD;  Location: [facility] ORS;  Service: General;  Laterality: N/A;  . OOPHORECTOMY    . oophrectomy Bilateral 1992    Family Psychiatric History: Reviewed family psychiatric history from my progress note on 08/18/2019.  Family History:  Family History  Problem Relation Age of Onset  . Depression Mother   . Pancreatic cancer Mother   . Suicidality Mother   . Colon cancer Father   . Breast cancer Sister 80  . Diabetes Brother   . Breast cancer Cousin   . Neuropathy Neg Hx     Social History: Reviewed social history from my progress note on 08/18/2019. Social History   Socioeconomic History  . Marital status: Single    Spouse name: Not on file  . Number of children: 0  . Years of education: 16  . Highest education level: Bachelor's degree (e.g., BA, AB, BS)  Occupational History  . Occupation: Retired  Scientific laboratory technician  . Financial resource strain: Somewhat hard  . Food insecurity    Worry: Sometimes true    Inability: Sometimes true  . Transportation needs    Medical: No    Non-medical: No  Tobacco Use  . Smoking status: Former Smoker    Packs/day: 1.00    Years: 40.00    Pack years: 40.00    Quit date: 12/16/2011    Years since quitting: 7.9  . Smokeless tobacco: Never Used  Substance and Sexual Activity  . Alcohol use: Not Currently    Alcohol/week: 0.0 standard drinks    Comment:  Occasionally has a drink  . Drug use: No  . Sexual activity: Never  Lifestyle  . Physical activity    Days per week: 0 days    Minutes per session: 0 min  . Stress: To some extent  Relationships  . Social Herbalist on phone: Not on file    Gets together: Not on file    Attends religious service: More than 4 times per year    Active member of club or organization: No    Attends meetings of clubs or organizations: Never    Relationship status: Never married  Other Topics Concern  . Not on file  Social History Narrative   Ms. Wien was born and reared in [place]. She attended [employer] and graduated in 1971 with her [employer] in Education. 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 She then worked for a [employer] in [place]. She is single. She is currently  retired. She loves reading. She loves to spend time with her dog.       Caffeine use: 2 cups coffee per day   2 glasses iced tea/ day   Right-handed    Allergies:  Allergies  Allergen Reactions  . Latex     Itching Itching    Metabolic Disorder Labs: Lab Results  Component Value Date   HGBA1C 5.5 10/12/2018   No results found for: PROLACTIN Lab Results  Component Value Date   CHOL 155 10/12/2018   TRIG 3,560 (H) 02/19/2019   HDL 38.90 (L) 10/12/2018   CHOLHDL 4 10/12/2018   VLDL 35.8 10/12/2018   LDLCALC 80 10/12/2018   LDLCALC 84 09/24/2016   Lab Results  Component Value Date   TSH 3.25 05/12/2019   TSH 4.70 (H) 04/15/2019    Therapeutic Level Labs: No results found for: LITHIUM No results found for: VALPROATE No components found for:  CBMZ  Current Medications: Current Outpatient Medications  Medication Sig Dispense Refill  . albuterol (PROVENTIL HFA;VENTOLIN HFA) 108 (90 Base) MCG/ACT inhaler Inhale 1-2 puffs into the lungs every 4 (four) hours as needed for wheezing or shortness of breath. 1 Inhaler 10  . busPIRone (BUSPAR)  7.5 MG tablet Take 1 tablet (7.5 mg total) by mouth 3 (three) times daily. 90 tablet 1  . clonazePAM (KLONOPIN) 0.5 MG tablet Take 1/2 tablet daily if needed for anxiety. (Patient taking differently: 0.25 mg 2 (two) times daily as needed. Take 1/2 tablet daily if needed for anxiety.) 15 tablet 0  . CREON 36000 units CPEP capsule TAKE 1 CAPSULE BY MOUTH WITH SNACKS FOR UP TO 14 DAYS TAKE WITH FIRST BITES OF MEAL.    [name] ELIQUIS 5 MG TABS tablet Take 1 tablet (5 mg total) by mouth 2 (two) times daily. 180 tablet 3  . esomeprazole (NEXIUM) 20 MG capsule TAKE 1 CAPSULE BY MOUTH EVERY DAY 90 capsule 1  . Fluticasone-Umeclidin-Vilant (TRELEGY ELLIPTA) 100-62.5-25 MCG/INH AEPB Inhale 1 puff into the lungs daily. 60 each 2  . furosemide (LASIX) 20 MG tablet Take 1 tablet (20 mg) by mouth daily, you may take 1 extra tablet (20 mg) after lunch as needed for increased swelling, weight gain, shortness of breath 90 tablet 2  . gabapentin (NEURONTIN) 600 MG tablet TAKE 1 TABLET BY MOUTH THREE TIMES A DAY 90 tablet 1  . GAVILYTE-N WITH FLAVOR PACK 420 g solution TAKE 4,000 MLS BY MOUTH AS DIRECTED TAKE AS DIRECTED FOR COLON PREP.    [name] HUMIRA PEN 40 MG/0.4ML PNKT Inject 40 mg as directed. Every 2 weeks    . hydroxychloroquine (PLAQUENIL) 200 MG tablet Take 200 mg by mouth 2 (two) times daily.     . hydrOXYzine (ATARAX/VISTARIL) 50 MG tablet Take 0.5-1 tablets (25-50 mg total) by mouth daily as needed for anxiety. Patient has supplies 90 tablet 0  . lidocaine (XYLOCAINE) 5 % ointment Apply 1 application topically as needed.    . potassium chloride (KLOR-CON) 10 MEQ tablet TAKE 1 TABLET (10 MEQ TOTAL) BY MOUTH DAILY AS NEEDED. 90 tablet 0  . propranolol ER (INDERAL LA) 120 MG 24 hr capsule Take 1 capsule (120 mg total) by mouth daily. 90 capsule 3  . sertraline (ZOLOFT) 50 MG tablet Take 1 tablet (50 mg total) by mouth daily. 90 tablet 0  . traMADol (ULTRAM) 50 MG tablet Take 1 tablet (50 mg total) by mouth every 12  (twelve) hours as needed for severe pain. (Patient not taking: Reported on  10/27/2019) 30 tablet 0  . traZODone (DESYREL) 50 MG tablet TAKE 1/2 TABLETS (25 MG TOTAL) BY MOUTH AT BEDTIME AS NEEDED FOR SLEEP. 45 tablet 1  . ZENPEP 25000-79000 units CPEP TAKE 1 CAPSULE WITH FIRST BITE OF MEALS AND SNACKS. INCREASE TO 2 CAPSULES IF NEEDED.     No current facility-administered medications for this visit.      Musculoskeletal: Strength & Muscle Tone: UTA Gait & Station: Uses a walker Patient leans: N/A  Psychiatric Specialty Exam: Review of Systems  Neurological: Positive for tremors.  Psychiatric/Behavioral: Negative for depression, hallucinations, substance abuse and suicidal ideas. The patient is not nervous/anxious and does not have insomnia.   All other systems reviewed and are negative.   There were no vitals taken for this visit.There is no height or weight on file to calculate BMI.  General Appearance: UTA  Eye Contact:  UTA  Speech:  Clear and Coherent  Volume:  Normal  Mood:  Euthymic  Affect:  UTA  Thought Process:  Goal Directed and Descriptions of Associations: Intact  Orientation:  Full (Time, Place, and Person)  Thought Content: Logical   Suicidal Thoughts:  No  Homicidal Thoughts:  No  Memory:  Immediate;   Fair Recent;   Fair Remote;   Fair  Judgement:  Intact  Insight:  Fair  Psychomotor Activity:  UTA  Concentration:  Concentration: Fair and Attention Span: Fair  Recall:  [employer] of Knowledge: Fair  Language: Fair  Akathisia:  No  Handed:  Right  AIMS (if indicated): does have a history of essential tremors - chronic   Assets:  Communication Skills Desire for Improvement Housing Social Support  ADL's:  Intact  Cognition: WNL  Sleep:  Fair   Screenings: Mini-Mental     Clinical Support from 08/01/2016 in [place]  Total Score (max 30 points )  30    PHQ2-9     Office Visit from 07/16/2019 in [facility]  Office Visit from 12/09/2018 in [place] Office Visit from 01/28/2017 in [place] from 08/01/2016 in [place] from 01/25/2015 in [place]  PHQ-2 Total Score  0  6  0  0  0  PHQ-9 Total Score  -  21  -  -  -       Assessment and Plan: Shyvonne is a 73 year old Caucasian female who has a history of MDD, GAD, panic attacks, atrial fibrillation, gastroesophageal reflux disease, neuropathy, coronary artery disease, history of encephalopathy, memory changes was evaluated by phone today.  Patient is biologically predisposed given her family history as well as multiple medical problems.  She also has psychosocial stressors of being single, health problems.  Patient is currently making progress on the current medication regimen.  Plan as noted below.  Plan MDD-in partial remission Zoloft 50 mg p.o. daily BuSpar 7.5 mg p.o. 3 times daily.  GAD-stable Continue Zoloft. Patient is on Klonopin neuropathy prescribed per neurology which also helps with anxiety.  Insomnia-stable Trazodone 25 mg p.o. nightly as needed Hydroxyzine 25 mg as needed for anxiety-reduced dosage.  Patient advised to reduce intake of medications with anticholinergic side effects.  Cognitive disorder-unstable She will continue to work with her neurologist.  Follow-up in clinic in 4 weeks or sooner if needed.  January 20 at 2:20 PM   I have spent atleast 15 minutes non face to face with patient today. More than 50 %  of the time was spent for psychoeducation and supportive psychotherapy and care coordination. This note was generated in part or whole with voice recognition software. Voice recognition is usually quite accurate but there are transcription errors that can and very often do occur. I apologize for any typographical errors that were not detected and corrected.      Ursula Alert, MD 11/29/2019, 1:08  PM

## 2019-11-29 ENCOUNTER — Other Ambulatory Visit: Payer: Self-pay

## 2019-11-29 ENCOUNTER — Encounter: Payer: Self-pay | Admitting: Psychiatry

## 2019-11-29 ENCOUNTER — Ambulatory Visit (INDEPENDENT_AMBULATORY_CARE_PROVIDER_SITE_OTHER): Payer: Medicare Other | Admitting: Psychiatry

## 2019-11-29 DIAGNOSIS — F3341 Major depressive disorder, recurrent, in partial remission: Secondary | ICD-10-CM | POA: Diagnosis not present

## 2019-11-29 DIAGNOSIS — F410 Panic disorder [episodic paroxysmal anxiety] without agoraphobia: Secondary | ICD-10-CM

## 2019-11-29 DIAGNOSIS — F411 Generalized anxiety disorder: Secondary | ICD-10-CM | POA: Diagnosis not present

## 2019-11-29 DIAGNOSIS — F09 Unspecified mental disorder due to known physiological condition: Secondary | ICD-10-CM

## 2019-11-29 MED ORDER — BUSPIRONE HCL 7.5 MG PO TABS: 7.5000 mg | ORAL_TABLET | Freq: Three times a day (TID) | ORAL | 1 refills | Status: DC

## 2019-12-17 ENCOUNTER — Encounter: Payer: Self-pay | Admitting: Family

## 2019-12-17 ENCOUNTER — Ambulatory Visit (INDEPENDENT_AMBULATORY_CARE_PROVIDER_SITE_OTHER): Payer: Medicare Other | Admitting: Family

## 2019-12-17 VITALS — Ht 66.5 in | Wt 205.0 lb

## 2019-12-17 DIAGNOSIS — F419 Anxiety disorder, unspecified: Secondary | ICD-10-CM

## 2019-12-17 DIAGNOSIS — D649 Anemia, unspecified: Secondary | ICD-10-CM

## 2019-12-17 DIAGNOSIS — I4891 Unspecified atrial fibrillation: Secondary | ICD-10-CM

## 2019-12-17 DIAGNOSIS — Z1231 Encounter for screening mammogram for malignant neoplasm of breast: Secondary | ICD-10-CM

## 2019-12-17 DIAGNOSIS — F329 Major depressive disorder, single episode, unspecified: Secondary | ICD-10-CM

## 2019-12-17 NOTE — Assessment & Plan Note (Signed)
Improved, continues to follow with Dr. Tasia Catchings, will follow

## 2019-12-17 NOTE — Progress Notes (Signed)
Virtual Visit via Video Note  I connected with@  on 12/17/19 at 11:30 AM EST by a video enabled telemedicine application and verified that I am speaking with the correct person using two identifiers.  Location patient: home Location provider:home office Persons participating in the virtual visit: patient, provider  I discussed the limitations of evaluation and management by telemedicine and the availability of in person appointments. The patient expressed understanding and agreed to proceed.   HPI: Feels well today.   Anemia- improved ( labs from 08/2019) , has f/u with Dr Tasia Catchings, 12/2019 Anxiety- very much improved on zoloft. 'burning feeling in chest has resolved.' Doing well on buspar. Uses klonopin maybe even once if needed.   BP at home 'average 120/80'.   afib- no palpitations.   Pancreatic insufficiency- following with Dr Alice Reichert and Tammi Klippel , f/u in 12/2019 per patient.  S/p UGI, colonoscopy; started on creon, since diarrhea improved.    Still uses walker   ROS: See pertinent positives and negatives per HPI.  Past Medical History:  Diagnosis Date  . A-fib ([place])   . Barretts esophagus   . Chest pain    a. 02/2014 Myoview: Ef 50%, no ischemia.  . Colon polyps   . COPD (chronic obstructive pulmonary disease) ([place])   . Depression with anxiety   . GERD (gastroesophageal reflux disease)   . Hx of benign essential tremor   . Neuropathy   . Permanent atrial fibrillation [facility])    a. s/p ablation 01/2012 in [facility] by Dr. Boyd Kerbs;  b. On sotalol & Xarelto;  c. 02/2014 Echo: EF 50-55%, mild conc LVH, nl LA size/structure;  d. Recurrent afib 8/15 & 08/29/2014.  [name] Rectal fistula   . Rheumatoid arthritis ([place])    On methotrexate and orencia  . Urinary incontinence   . Vitamin D deficiency     Past Surgical History:  Procedure Laterality Date  . ABDOMINAL HYSTERECTOMY  1992  . APPENDECTOMY    . CARDIAC ELECTROPHYSIOLOGY STUDY AND ABLATION  2013  . CHOLECYSTECTOMY  1987   . COLONOSCOPY N/A 05/08/2015   Procedure: COLONOSCOPY;  Surgeon: Manya Silvas, MD;  Location: [facility] ENDOSCOPY;  Service: Endoscopy;  Laterality: N/A;  . COLONOSCOPY WITH PROPOFOL N/A 09/29/2019   Procedure: COLONOSCOPY WITH PROPOFOL;  Surgeon: Toledo, Benay Pike, MD;  Location: [facility] ENDOSCOPY;  Service: Gastroenterology;  Laterality: N/A;  . ESOPHAGOGASTRODUODENOSCOPY N/A 05/06/2015   Procedure: ESOPHAGOGASTRODUODENOSCOPY (EGD);  Surgeon: Lollie Sails, MD;  Location[facility] ENDOSCOPY;  Service: Endoscopy;  Laterality: N/A;  . ESOPHAGOGASTRODUODENOSCOPY (EGD) WITH PROPOFOL N/A 09/29/2019   Procedure: ESOPHAGOGASTRODUODENOSCOPY (EGD) WITH PROPOFOL;  Surgeon: Toledo, Benay Pike, MD;  Location: [facility] ENDOSCOPY;  Service: Gastroenterology;  Laterality: N/A;  . LAPAROSCOPIC APPENDECTOMY N/A 02/19/2019   Procedure: APPENDECTOMY LAPAROSCOPIC converted to open appendectomy;  Surgeon: Herbert Pun, MD;  Location: [facility] ORS;  Service: General;  Laterality: N/A;  . OOPHORECTOMY    . oophrectomy Bilateral 1992    Family History  Problem Relation Age of Onset  . Depression Mother   . Pancreatic cancer Mother   . Suicidality Mother   . Colon cancer Father   . Breast cancer Sister 47  . Diabetes Brother   . Breast cancer Cousin   . Neuropathy Neg Hx     SOCIAL HX: former smoker   Current Outpatient Medications:  .  albuterol (PROVENTIL HFA;VENTOLIN HFA) 108 (90 Base) MCG/ACT inhaler, Inhale 1-2 puffs into the lungs every 4 (four) hours as needed for wheezing or shortness  of breath., Disp: 1 Inhaler, Rfl: 10 .  busPIRone (BUSPAR) 7.5 MG tablet, Take 1 tablet (7.5 mg total) by mouth 3 (three) times daily., Disp: 90 tablet, Rfl: 1 .  clonazePAM (KLONOPIN) 0.5 MG tablet, Take 1/2 tablet daily if needed for anxiety. (Patient taking differently: 0.25 mg 2 (two) times daily as needed. Take 1/2 tablet daily if needed for anxiety.), Disp: 15 tablet, Rfl: 0 .  CREON 36000 units CPEP capsule, TAKE 1  CAPSULE BY MOUTH WITH SNACKS FOR UP TO 14 DAYS TAKE WITH FIRST BITES OF MEAL., Disp: , Rfl:  .  ELIQUIS 5 MG TABS tablet, Take 1 tablet (5 mg total) by mouth 2 (two) times daily., Disp: 180 tablet, Rfl: 3 .  esomeprazole (NEXIUM) 20 MG capsule, TAKE 1 CAPSULE BY MOUTH EVERY DAY, Disp: 90 capsule, Rfl: 1 .  Fluticasone-Umeclidin-Vilant (TRELEGY ELLIPTA) 100-62.5-25 MCG/INH AEPB, Inhale 1 puff into the lungs daily., Disp: 60 each, Rfl: 2 .  furosemide (LASIX) 20 MG tablet, Take 1 tablet (20 mg) by mouth daily, you may take 1 extra tablet (20 mg) after lunch as needed for increased swelling, weight gain, shortness of breath, Disp: 90 tablet, Rfl: 2 .  gabapentin (NEURONTIN) 600 MG tablet, TAKE 1 TABLET BY MOUTH THREE TIMES A DAY, Disp: 90 tablet, Rfl: 1 .  HUMIRA PEN 40 MG/0.4ML PNKT, Inject 40 mg as directed. Every 2 weeks, Disp: , Rfl:  .  hydroxychloroquine (PLAQUENIL) 200 MG tablet, Take 200 mg by mouth 2 (two) times daily. , Disp: , Rfl:  .  lidocaine (XYLOCAINE) 5 % ointment, Apply 1 application topically as needed., Disp: , Rfl:  .  potassium chloride (KLOR-CON) 10 MEQ tablet, TAKE 1 TABLET (10 MEQ TOTAL) BY MOUTH DAILY AS NEEDED., Disp: 90 tablet, Rfl: 0 .  propranolol ER (INDERAL LA) 120 MG 24 hr capsule, Take 1 capsule (120 mg total) by mouth daily., Disp: 90 capsule, Rfl: 3 .  sertraline (ZOLOFT) 50 MG tablet, Take 1 tablet (50 mg total) by mouth daily., Disp: 90 tablet, Rfl: 0 .  traZODone (DESYREL) 50 MG tablet, TAKE 1/2 TABLETS (25 MG TOTAL) BY MOUTH AT BEDTIME AS NEEDED FOR SLEEP., Disp: 45 tablet, Rfl: 1 .  hydrOXYzine (ATARAX/VISTARIL) 50 MG tablet, Take 0.5-1 tablets (25-50 mg total) by mouth daily as needed for anxiety. Patient has supplies (Patient not taking: Reported on 12/17/2019), Disp: 90 tablet, Rfl: 0 .  traMADol (ULTRAM) 50 MG tablet, Take 1 tablet (50 mg total) by mouth every 12 (twelve) hours as needed for severe pain. (Patient not taking: Reported on 12/17/2019), Disp: 30  tablet, Rfl: 0  EXAM:  VITALS per patient if applicable: Today's Vitals   12/17/19 1142  Weight: 205 lb (93 kg)  Height: 5' 6.5" (1.689 m)   Body mass index is 32.59 kg/m.  GENERAL: alert, oriented, appears well and in no acute distress  HEENT: atraumatic, conjunttiva clear, no obvious abnormalities on inspection of external nose and ears  NECK: normal movements of the head and neck  LUNGS: on inspection no signs of respiratory distress, breathing rate appears normal, no obvious gross SOB, gasping or wheezing  CV: no obvious cyanosis  MS: moves all visible extremities without noticeable abnormality  PSYCH/NEURO: pleasant and cooperative, no obvious depression or anxiety, speech and thought processing grossly intact  ASSESSMENT AND PLAN:  Discussed the following assessment and plan:  Encounter for screening mammogram for malignant neoplasm of breast - Plan: 3D mammogram- MM SCREENING BREAST TOMO BILATERAL, DG Bone Density  Anxiety and depression  Atrial fibrillation with RVR (HCC)  Anemia, unspecified type Problem List Items Addressed This Visit      Cardiovascular and Mediastinum   Atrial fibrillation with RVR ([place])    Appears asymptomatic, will follow        Other   Screening for breast cancer - Primary   Relevant Orders   3D mammogram- MM SCREENING BREAST TOMO BILATERAL   DG Bone Density   Anemia    Improved, continues to follow with Dr. Tasia Catchings, will follow      Anxiety and depression    Much improved. Continues to follow with [place]. Will follow.        Ordered mammogram and patient understands schedule.  We also discussed Covid vaccine, which I strongly urged her to have once available to her  -we discussed possible serious and likely etiologies, options for evaluation and workup, limitations of telemedicine visit vs in person visit, treatment, treatment risks and precautions. Pt prefers to treat via telemedicine empirically rather then risking or  undertaking an in person visit at this moment. Patient agrees to seek prompt in person care if worsening, new symptoms arise, or if is not improving with treatment.   I discussed the assessment and treatment plan with the patient. The patient was provided an opportunity to ask questions and all were answered. The patient agreed with the plan and demonstrated an understanding of the instructions.   The patient was advised to call back or seek an in-person evaluation if the symptoms worsen or if the condition fails to improve as anticipated.   Mable Paris, FNP

## 2019-12-17 NOTE — Assessment & Plan Note (Signed)
Much improved. Continues to follow with E[place]. Will follow.

## 2019-12-17 NOTE — Patient Instructions (Signed)
Please call call and schedule your 3D mammogram, bone density scan as discussed.   [place]  Blair [place], [place]

## 2019-12-17 NOTE — Assessment & Plan Note (Signed)
Appears asymptomatic, will follow

## 2019-12-17 NOTE — Progress Notes (Signed)
Patient called & scheduled for B-12 injections for 4 weeks.

## 2019-12-21 ENCOUNTER — Other Ambulatory Visit: Payer: Self-pay

## 2019-12-21 ENCOUNTER — Ambulatory Visit: Payer: Medicare Other | Admitting: *Deleted

## 2019-12-28 ENCOUNTER — Ambulatory Visit: Payer: Medicare Other

## 2019-12-31 ENCOUNTER — Other Ambulatory Visit: Payer: Self-pay | Admitting: Family

## 2019-12-31 DIAGNOSIS — G629 Polyneuropathy, unspecified: Secondary | ICD-10-CM

## 2020-01-05 ENCOUNTER — Ambulatory Visit (INDEPENDENT_AMBULATORY_CARE_PROVIDER_SITE_OTHER): Payer: Medicare Other | Admitting: Lab

## 2020-01-05 ENCOUNTER — Other Ambulatory Visit: Payer: Self-pay

## 2020-01-05 DIAGNOSIS — E538 Deficiency of other specified B group vitamins: Secondary | ICD-10-CM

## 2020-01-05 MED ORDER — CYANOCOBALAMIN 1000 MCG/ML IJ SOLN
1000.0000 ug | Freq: Once | INTRAMUSCULAR | Status: AC
  Administered 2020-01-05: 14:00:00 1000 ug via INTRAMUSCULAR

## 2020-01-05 NOTE — Progress Notes (Addendum)
Pt in office today for B-12 injection in L-Deltoid. Pt tolerated well. Agree with plan. Mable Paris, NP

## 2020-01-10 ENCOUNTER — Inpatient Hospital Stay: Payer: Medicare Other | Attending: Oncology

## 2020-01-10 ENCOUNTER — Other Ambulatory Visit: Payer: Self-pay

## 2020-01-10 DIAGNOSIS — Z7901 Long term (current) use of anticoagulants: Secondary | ICD-10-CM | POA: Diagnosis not present

## 2020-01-10 DIAGNOSIS — D508 Other iron deficiency anemias: Secondary | ICD-10-CM | POA: Insufficient documentation

## 2020-01-10 DIAGNOSIS — J449 Chronic obstructive pulmonary disease, unspecified: Secondary | ICD-10-CM | POA: Diagnosis not present

## 2020-01-10 DIAGNOSIS — D500 Iron deficiency anemia secondary to blood loss (chronic): Secondary | ICD-10-CM

## 2020-01-10 DIAGNOSIS — Z79899 Other long term (current) drug therapy: Secondary | ICD-10-CM | POA: Insufficient documentation

## 2020-01-10 DIAGNOSIS — E538 Deficiency of other specified B group vitamins: Secondary | ICD-10-CM | POA: Diagnosis not present

## 2020-01-10 DIAGNOSIS — I4821 Permanent atrial fibrillation: Secondary | ICD-10-CM | POA: Insufficient documentation

## 2020-01-10 DIAGNOSIS — Z87891 Personal history of nicotine dependence: Secondary | ICD-10-CM | POA: Diagnosis not present

## 2020-01-10 LAB — IRON AND TIBC
Iron: 35 ug/dL (ref 28–170)
Saturation Ratios: 10 % — ABNORMAL LOW (ref 10.4–31.8)
TIBC: 368 ug/dL (ref 250–450)
UIBC: 333 ug/dL

## 2020-01-10 LAB — CBC WITH DIFFERENTIAL/PLATELET
Abs Immature Granulocytes: 0.03 10*3/uL (ref 0.00–0.07)
Basophils Absolute: 0 10*3/uL (ref 0.0–0.1)
Basophils Relative: 0 %
Eosinophils Absolute: 0.1 10*3/uL (ref 0.0–0.5)
Eosinophils Relative: 2 %
HCT: 35.4 % — ABNORMAL LOW (ref 36.0–46.0)
Hemoglobin: 10.2 g/dL — ABNORMAL LOW (ref 12.0–15.0)
Immature Granulocytes: 0 %
Lymphocytes Relative: 16 %
Lymphs Abs: 1.3 10*3/uL (ref 0.7–4.0)
MCH: 23.8 pg — ABNORMAL LOW (ref 26.0–34.0)
MCHC: 28.8 g/dL — ABNORMAL LOW (ref 30.0–36.0)
MCV: 82.7 fL (ref 80.0–100.0)
Monocytes Absolute: 0.8 10*3/uL (ref 0.1–1.0)
Monocytes Relative: 9 %
Neutro Abs: 6 10*3/uL (ref 1.7–7.7)
Neutrophils Relative %: 73 %
Platelets: 212 10*3/uL (ref 150–400)
RBC: 4.28 MIL/uL (ref 3.87–5.11)
RDW: 15.3 % (ref 11.5–15.5)
WBC: 8.4 10*3/uL (ref 4.0–10.5)
nRBC: 0 % (ref 0.0–0.2)

## 2020-01-10 LAB — FERRITIN: Ferritin: 16 ng/mL (ref 11–307)

## 2020-01-11 ENCOUNTER — Encounter: Payer: Self-pay | Admitting: Family

## 2020-01-11 ENCOUNTER — Ambulatory Visit: Payer: Medicare Other

## 2020-01-11 ENCOUNTER — Other Ambulatory Visit: Payer: Self-pay

## 2020-01-11 NOTE — Progress Notes (Deleted)
Called patient no answer left message to call back.

## 2020-01-12 ENCOUNTER — Inpatient Hospital Stay: Payer: Medicare Other

## 2020-01-12 ENCOUNTER — Inpatient Hospital Stay: Payer: Medicare Other | Admitting: Oncology

## 2020-01-13 ENCOUNTER — Ambulatory Visit: Payer: Medicare Other

## 2020-01-16 ENCOUNTER — Other Ambulatory Visit: Payer: Self-pay | Admitting: Psychiatry

## 2020-01-16 DIAGNOSIS — F411 Generalized anxiety disorder: Secondary | ICD-10-CM

## 2020-01-16 DIAGNOSIS — F410 Panic disorder [episodic paroxysmal anxiety] without agoraphobia: Secondary | ICD-10-CM

## 2020-01-16 DIAGNOSIS — F331 Major depressive disorder, recurrent, moderate: Secondary | ICD-10-CM

## 2020-01-17 ENCOUNTER — Telehealth: Payer: Self-pay

## 2020-01-17 ENCOUNTER — Telehealth: Payer: Self-pay | Admitting: Cardiovascular Disease

## 2020-01-17 DIAGNOSIS — K8689 Other specified diseases of pancreas: Secondary | ICD-10-CM | POA: Diagnosis not present

## 2020-01-17 DIAGNOSIS — K219 Gastro-esophageal reflux disease without esophagitis: Secondary | ICD-10-CM | POA: Diagnosis not present

## 2020-01-17 DIAGNOSIS — K22719 Barrett's esophagus with dysplasia, unspecified: Secondary | ICD-10-CM | POA: Diagnosis not present

## 2020-01-17 DIAGNOSIS — K529 Noninfective gastroenteritis and colitis, unspecified: Secondary | ICD-10-CM | POA: Diagnosis not present

## 2020-01-17 DIAGNOSIS — Q453 Other congenital malformations of pancreas and pancreatic duct: Secondary | ICD-10-CM | POA: Insufficient documentation

## 2020-01-17 DIAGNOSIS — R1310 Dysphagia, unspecified: Secondary | ICD-10-CM | POA: Diagnosis not present

## 2020-01-17 NOTE — Telephone Encounter (Signed)
I can see where patient has spoken with Dr. Donivan Scull office but they have not yet called back patient. She said that she still feels anxious but that is just because her little dog is sick. She also said that she doesn't feel good but she has a lot going against her. She saw GI today for her diarrhea. She still denies SOB, HA, CP, left arm pain, blurry vision.

## 2020-01-17 NOTE — Telephone Encounter (Signed)
noted 

## 2020-01-17 NOTE — Telephone Encounter (Signed)
I spoke to patient & she did check BP while on the phone with me. It came down to 155/89 & she said that she was feeling better. She has taken buspar this morning as prescribed. She again denies any CP, HA, SOB, vision changes, left arm numbness and I advised that if she did she would need to go to ED ASAP. Pt was agreeable to this even thought she is afraid of catching [place] while there. I asked that patient call us after her GI appointment to let us know what they get her BP at to see if manually BP is better since more accurate. Pt agreeable to this & I will call back this afternoon if I do not hear from her.

## 2020-01-17 NOTE — Telephone Encounter (Signed)
Patient called me this morning & said that her BP was reading 172/110 on her BP cuff. She said that she just felt kind of jittery or anxious in her chest. Pt denies any CP, HA. SOB, arm pain or chest pain. She did take propanalol about 30 minutes ago. I asked that she stay calm & recheck in about 30 more minutes. She also has an appointment today at GI so they will be able to manually check BP. I told pt that I would ask your advice but I did tell her about UC across the street as a possible option. She said that she wasn't particularly anxious or stressed but her dog was sick & she was taking care of her. Please advise? Dr. Olivia Mackie does have two same day appointments tomorrow.

## 2020-01-17 NOTE — Telephone Encounter (Signed)
Pt c/o BP issue: STAT if pt c/o blurred vision, one-sided weakness or slurred speech  1. What are your last 5 BP readings? Taken last week  172/110 169/107 148/97 158/99  2. Are you having any other symptoms (ex. Dizziness, headache, blurred vision, passed out)? No, none of those symptoms but does feel bad   3. What is your BP issue? BP is high, please call to discuss.

## 2020-01-17 NOTE — Telephone Encounter (Signed)
Very well managed Brandy Armstrong, thank you.  Lets be sure we circle back with her this afternoon

## 2020-01-17 NOTE — Telephone Encounter (Signed)
Agree with your advice. Keep triaging and ensure no CP, SOB, vision changes, HA , left arm numbness - that would be ED.   Call her in 20 mins- by 9am and see where we are with BP, and how she is feeling Advise deep breathing, feet on floor and sit still for 10 minutes.  If she feels anxcious, please advise her to take her buspar as prescribed.

## 2020-01-17 NOTE — Telephone Encounter (Signed)
Patient calling to check on status  Has some more updated readings 150/100 157/110 now  Please call to discuss

## 2020-01-17 NOTE — Telephone Encounter (Signed)
Spoke with patient and she reports that today BP's 172/110, 169/107, 158/99, 161/75, and 150/100 at provider office. These readings were prior to her medications. She did just check it again and it was 131/86. Advised that she should check blood pressures 2 hours after medications and scheduled her to come in and see Laurann Montana NP in order to review her blood pressures anad medications. Instructed her to keep log of readings and bring those in to her appointment. She verbalized understanding of our conversation, agreement with plan, and had no further questions at this time.

## 2020-01-17 NOTE — Telephone Encounter (Signed)
Called Pt and gave her message from NP Mable Paris. Pt stated she understands. Pt stated her last BP reading was 116/75, she feels okay, she is Not having any symptom. Pt stated she understands to go to urgent care if she has any symptom. Pt also stated she is okay with waiting on her appt for 01/20/20. Pt stated her appt is with NP April Holding because Dr. Arta Bruce could not see her.

## 2020-01-17 NOTE — Telephone Encounter (Signed)
Pt called returning your phone cal

## 2020-01-17 NOTE — Telephone Encounter (Signed)
Call pt Brandy Armstrong had been speaking with this patient Please circle back I can see she sees Dr Donivan Scull office on 01/20/20 for elevated blood pressure.  Does she feel okay about this? Please her BP has come down.  Emphasize that any CP, vision changes, severe HA, SOB , she needs to go to urgent care before hand.

## 2020-01-17 NOTE — Telephone Encounter (Signed)
Thank you for the update!

## 2020-01-18 ENCOUNTER — Ambulatory Visit: Payer: Medicare Other

## 2020-01-19 ENCOUNTER — Ambulatory Visit (INDEPENDENT_AMBULATORY_CARE_PROVIDER_SITE_OTHER): Payer: Medicare Other | Admitting: Psychiatry

## 2020-01-19 ENCOUNTER — Other Ambulatory Visit: Payer: Self-pay

## 2020-01-19 ENCOUNTER — Encounter: Payer: Self-pay | Admitting: Psychiatry

## 2020-01-19 DIAGNOSIS — F331 Major depressive disorder, recurrent, moderate: Secondary | ICD-10-CM | POA: Diagnosis not present

## 2020-01-19 DIAGNOSIS — F411 Generalized anxiety disorder: Secondary | ICD-10-CM

## 2020-01-19 DIAGNOSIS — F410 Panic disorder [episodic paroxysmal anxiety] without agoraphobia: Secondary | ICD-10-CM | POA: Diagnosis not present

## 2020-01-19 DIAGNOSIS — F09 Unspecified mental disorder due to known physiological condition: Secondary | ICD-10-CM

## 2020-01-19 MED ORDER — TRAZODONE HCL 50 MG PO TABS: 50.0000 mg | ORAL_TABLET | Freq: Every day | ORAL | 0 refills | Status: DC

## 2020-01-19 MED ORDER — BUSPIRONE HCL 10 MG PO TABS: 10.0000 mg | ORAL_TABLET | Freq: Three times a day (TID) | ORAL | 0 refills | Status: DC

## 2020-01-19 NOTE — Progress Notes (Signed)
Provider Location : [facility] Patient Location : Home   Virtual Visit via Telephone Note  I connected with Brandy Armstrong on 01/19/20 at  2:20 PM EST by telephone and verified that I am speaking with the correct person using two identifiers.   I discussed the limitations, risks, security and privacy concerns of performing an evaluation and management service by telephone and the availability of in person appointments. I also discussed with the patient that there may be a patient responsible charge related to this service. The patient expressed understanding and agreed to proceed.      I discussed the assessment and treatment plan with the patient. The patient was provided an opportunity to ask questions and all were answered. The patient agreed with the plan and demonstrated an understanding of the instructions.   The patient was advised to call back or seek an in-person evaluation if the symptoms worsen or if the condition fails to improve as anticipated.  Gard[place] MD OP Progress Note  01/19/2020 6:00 PM Brandy Armstrong  MRN:  [id number]  Chief Complaint:  Chief Complaint    Follow-up     HPI: Brandy Armstrong is a 74 year old Caucasian female, retired, single, lives in [place], has a history of depression, anxiety, panic attacks, atrial fibrillation, peripheral artery disease, gastroesophageal reflux disease, coronary artery disease, neuropathy, memory problems, history of encephalopathy was evaluated by phone today.  Patient preferred to do a phone call.  Patient today reports she is currently struggling with anxiety symptoms.  She reports she has a lot of psychosocial stressors going on.  She is worried about the pandemic.  She is socially isolated.  She also reports recent diagnosis of pancreatic insufficiency which is also making her anxious.  Patient reports her blood pressure was also recently elevated which is also making her more anxious.  She wonders whether her anxiety is also contributing to the  blood pressure elevation.  Patient denies any suicidality, homicidality or perceptual disturbances.  Patient reports she is also struggling with sleep issues.  She is interested in increasing the dosage of her trazodone if possible.  Patient reports she stopped going to her therapist since she did not feel it was beneficial.  Encouraged making an appointment with another therapist.  She reports she will let writer know if she is interested.  Patient denies any other concerns today. Visit Diagnosis:    ICD-10-CM   1. MDD (major depressive disorder), recurrent episode, moderate (HCC)  F33.1 traZODone (DESYREL) 50 MG tablet    busPIRone (BUSPAR) 10 MG tablet  2. GAD (generalized anxiety disorder)  F41.1 busPIRone (BUSPAR) 10 MG tablet  3. Panic attacks  F41.0 busPIRone (BUSPAR) 10 MG tablet  4. Cognitive disorder  F09     Past Psychiatric History: Reviewed past psychiatric history from my progress note on 08/18/2019.  Past trials of Zoloft, Prozac, Lexapro, Klonopin  Past Medical History:  Past Medical History:  Diagnosis Date  . A-fib ([place])   . Barretts esophagus   . Chest pain    a. 02/2014 Myoview: Ef 50%, no ischemia.  . Colon polyps   . COPD (chronic obstructive pulmonary disease) ([place])   . Depression with anxiety   . GERD (gastroesophageal reflux disease)   . Hx of benign essential tremor   . Neuropathy   . Permanent atrial fibrillation [facility])    a. s/p ablation 01/2012 in [facility] by Dr. Boyd Kerbs;  b. On sotalol & Xarelto;  c. 02/2014 Echo: EF 50-55%, mild conc LVH, nl  LA size/structure;  d. Recurrent afib 8/15 & 08/29/2014.  [name] Rectal fistula   . Rheumatoid arthritis ([place])    On methotrexate and orencia  . Urinary incontinence   . Vitamin D deficiency     Past Surgical History:  Procedure Laterality Date  . ABDOMINAL HYSTERECTOMY  1992  . APPENDECTOMY    . CARDIAC ELECTROPHYSIOLOGY STUDY AND ABLATION  2013  . CHOLECYSTECTOMY  1987  . COLONOSCOPY N/A 05/08/2015   Procedure:  COLONOSCOPY;  Surgeon: Manya Silvas, MD;  Location: [facility] ENDOSCOPY;  Service: Endoscopy;  Laterality: N/A;  . COLONOSCOPY WITH PROPOFOL N/A 09/29/2019   Procedure: COLONOSCOPY WITH PROPOFOL;  Surgeon: Toledo, Benay Pike, MD;  Location: [facility] ENDOSCOPY;  Service: Gastroenterology;  Laterality: N/A;  . ESOPHAGOGASTRODUODENOSCOPY N/A 05/06/2015   Procedure: ESOPHAGOGASTRODUODENOSCOPY (EGD);  Surgeon: Lollie Sails, MD;  Location: [facility] ENDOSCOPY;  Service: Endoscopy;  Laterality: N/A;  . ESOPHAGOGASTRODUODENOSCOPY (EGD) WITH PROPOFOL N/A 09/29/2019   Procedure: ESOPHAGOGASTRODUODENOSCOPY (EGD) WITH PROPOFOL;  Surgeon: Toledo, Benay Pike, MD;  Location: [facility] ENDOSCOPY;  Service: Gastroenterology;  Laterality: N/A;  . LAPAROSCOPIC APPENDECTOMY N/A 02/19/2019   Procedure: APPENDECTOMY LAPAROSCOPIC converted to open appendectomy;  Surgeon: Herbert Pun, MD;  Location: [facility] ORS;  Service: General;  Laterality: N/A;  . OOPHORECTOMY    . oophrectomy Bilateral 1992    Family Psychiatric History: Reviewed family psychiatric history from my progress note on 08/18/2019.  Family History:  Family History  Problem Relation Age of Onset  . Depression Mother   . Pancreatic cancer Mother   . Suicidality Mother   . Colon cancer Father   . Breast cancer Sister 12  . Diabetes Brother   . Breast cancer Cousin   . Neuropathy Neg Hx     Social History: Reviewed social history from my progress note on 08/18/2019. Social History   Socioeconomic History  . Marital status: Single    Spouse name: Not on file  . Number of children: 0  . Years of education: 16  . Highest education level: Bachelor's degree (e.g., BA, AB, BS)  Occupational History  . Occupation: Retired  Tobacco Use  . Smoking status: Former Smoker    Packs/day: 1.00    Years: 40.00    Pack years: 40.00    Quit date: 12/16/2011    Years since quitting: 8.0  . Smokeless tobacco: Never Used  Substance and Sexual Activity  . Alcohol  use: Not Currently    Alcohol/week: 0.0 standard drinks    Comment: Occasionally has a drink  . Drug use: No  . Sexual activity: Never  Other Topics Concern  . Not on file  Social History Narrative   Ms. Tutt was born and reared in [place]. She attended [employer] and graduated in 1971 with her [employer] in Education. 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 She then worked for a [employer] in [place]. She is single. She is currently retired. She loves reading. She loves to spend time with her dog.       Caffeine use: 2 cups coffee per day   2 glasses iced tea/ day   Right-handed   Social Determinants of Health   Financial Resource Strain: Medium Risk  . Difficulty of Paying Living Expenses: Somewhat hard  Food Insecurity: Food Insecurity Present  . Worried About Charity fundraiser in the Last Year: Sometimes true  . Ran Out of Food in the Last Year: Sometimes true  Transportation Needs: No Transportation Needs  . Lack of Transportation (Medical): No  . Lack of Transportation (Non-Medical): No  Physical Activity: Inactive  . Days of Exercise per Week: 0 days  . Minutes of Exercise per Session: 0 min  Stress: Stress Concern Present  . Feeling of Stress : To some extent  Social Connections: Unknown  . Frequency of Communication with Friends and Family: Not on file  . Frequency of Social Gatherings with Friends and Family: Not on file  . Attends Religious Services: More than 4 times per year  . Active Member of Clubs or Organizations: No  . Attends Archivist Meetings: Never  . Marital Status: Never married    Allergies:  Allergies  Allergen Reactions  . Latex     Itching Itching    Metabolic Disorder Labs: Lab Results  Component Value Date   HGBA1C 5.5 10/12/2018   No results found for: PROLACTIN Lab Results  Component Value Date   CHOL 155 10/12/2018   TRIG 3,560 (H) 02/19/2019   HDL  38.90 (L) 10/12/2018   CHOLHDL 4 10/12/2018   VLDL 35.8 10/12/2018   LDLCALC 80 10/12/2018   LDLCALC 84 09/24/2016   Lab Results  Component Value Date   TSH 3.25 05/12/2019   TSH 4.70 (H) 04/15/2019    Therapeutic Level Labs: No results found for: LITHIUM No results found for: VALPROATE No components found for:  CBMZ  Current Medications: Current Outpatient Medications  Medication Sig Dispense Refill  . lipase/protease/amylase (CREON) 36000 UNITS CPEP capsule Take by mouth.    [name] albuterol (PROVENTIL HFA;VENTOLIN HFA) 108 (90 Base) MCG/ACT inhaler Inhale 1-2 puffs into the lungs every 4 (four) hours as needed for wheezing or shortness of breath. 1 Inhaler 10  . busPIRone (BUSPAR) 10 MG tablet Take 1 tablet (10 mg total) by mouth 3 (three) times daily. 270 tablet 0  . clonazePAM (KLONOPIN) 0.5 MG tablet Take 1/2 tablet daily if needed for anxiety. (Patient taking differently: 0.25 mg 2 (two) times daily as needed. Take 1/2 tablet daily if needed for anxiety.) 15 tablet 0  . CREON 36000 units CPEP capsule TAKE 1 CAPSULE BY MOUTH WITH SNACKS FOR UP TO 14 DAYS TAKE WITH FIRST BITES OF MEAL.    [name] ELIQUIS 5 MG TABS tablet Take 1 tablet (5 mg total) by mouth 2 (two) times daily. 180 tablet 3  . esomeprazole (NEXIUM) 20 MG capsule TAKE 1 CAPSULE BY MOUTH EVERY DAY 90 capsule 1  . Fluticasone-Umeclidin-Vilant (TRELEGY ELLIPTA) 100-62.5-25 MCG/INH AEPB Inhale 1 puff into the lungs daily. 60 each 2  . furosemide (LASIX) 20 MG tablet Take 1 tablet (20 mg) by mouth daily, you may take 1 extra tablet (20 mg) after lunch as needed for increased swelling, weight gain, shortness of breath 90 tablet 2  . gabapentin (NEURONTIN) 600 MG tablet TAKE 1 TABLET BY MOUTH THREE TIMES A DAY 90 tablet 1  . HUMIRA PEN 40 MG/0.4ML PNKT Inject 40 mg as directed. Every 2 weeks    . hydroxychloroquine (PLAQUENIL) 200 MG tablet Take 200 mg by mouth 2 (two) times daily.     . hydrOXYzine (ATARAX/VISTARIL) 50 MG tablet  Take 0.5-1 tablets (25-50 mg total) by mouth daily as needed for anxiety. Patient has supplies (Patient not taking: Reported on 12/17/2019) 90 tablet 0  . lidocaine (XYLOCAINE) 5 % ointment Apply 1 application topically as needed.    . potassium chloride (KLOR-CON) 10 MEQ tablet TAKE 1 TABLET (10 MEQ TOTAL) BY  MOUTH DAILY AS NEEDED. 90 tablet 0  . propranolol ER (INDERAL LA) 120 MG 24 hr capsule Take 1 capsule (120 mg total) by mouth daily. 90 capsule 3  . sertraline (ZOLOFT) 50 MG tablet TAKE 1 TABLET BY MOUTH EVERY DAY 90 tablet 0  . traMADol (ULTRAM) 50 MG tablet Take 1 tablet (50 mg total) by mouth every 12 (twelve) hours as needed for severe pain. (Patient not taking: Reported on 12/17/2019) 30 tablet 0  . traZODone (DESYREL) 50 MG tablet Take 1 tablet (50 mg total) by mouth at bedtime. 90 tablet 0   No current facility-administered medications for this visit.     Musculoskeletal: Strength & Muscle Tone: UTA Gait & Station: Walks with cane Patient leans: N/A  Psychiatric Specialty Exam: Review of Systems  Psychiatric/Behavioral: Positive for dysphoric mood and sleep disturbance.  All other systems reviewed and are negative.   There were no vitals taken for this visit.There is no height or weight on file to calculate BMI.  General Appearance: UTA  Eye Contact:  UTA  Speech:  Clear and Coherent  Volume:  Normal  Mood:  Anxious  Affect:  Congruent  Thought Process:  Goal Directed and Descriptions of Associations: Intact  Orientation:  Full (Time, Place, and Person)  Thought Content: Logical   Suicidal Thoughts:  No  Homicidal Thoughts:  No  Memory:  Immediate;   Fair Recent;   Fair Remote;   Fair  Judgement:  Fair  Insight:  Fair  Psychomotor Activity:  UTA  Concentration:  Concentration: Fair and Attention Span: Fair  Recall:  [employer] of Knowledge: Fair  Language: Fair  Akathisia:  No  Handed:  Right  AIMS (if indicated): denies tremors, rigidity  Assets:   Communication Skills Desire for Improvement  ADL's:  Intact  Cognition: WNL  Sleep:  restless   Screenings: Mini-Mental     Clinical Support from 08/01/2016 in [facility]  Total Score (max 30 points )  30    PHQ2-9     Office Visit from 12/17/2019 in [facility] Office Visit from 07/16/2019 in [place] Office Visit from 12/09/2018 in [facility] Office Visit from 01/28/2017 in [place] from 08/01/2016 in [place]  PHQ-2 Total Score  1  0  6  0  0  PHQ-9 Total Score  5  --  21  --  --       Assessment and Plan: Zeola is a 74 year old Caucasian female who has a history of MDD, GAD, panic attacks, atrial fibrillation, gastroesophageal reflux disease, neuropathy, coronary artery disease, history of encephalopathy, memory changes was evaluated by phone today.  Patient is biologically predisposed given her family history as well as multiple medical problems.  She also has psychosocial stressors of being single, health problems.  Patient is currently struggling with anxiety symptoms and will benefit from medication readjustment.  Plan as noted below.  Plan MDD in partial remission Zoloft 50 mg p.o. daily BuSpar 10 mg p.o. 3 times daily  GAD-unstable Continue Zoloft as prescribed Increase BuSpar to 10 mg p.o. 3 times daily Continue Klonopin as prescribed by neurology for neuropathy.  Insomnia-unstable Increase trazodone to 50 mg p.o. nightly as needed Hydroxyzine 25 mg as needed for anxiety-reduced dosage.  Cognitive disorder-she will continue to work with neurology.  Encouraged to restart psychotherapy sessions-patient reports she will let writer know.  Follow-up in clinic in 6 to  8 weeks or sooner if needed.  March 11 at 2:20 PM  I have spent atleast 20 minutes non face to face with patient today. More than 50 % of the time was spent for  ordering medications and test ,psychoeducation and supportive psychotherapy and care coordination,as well as documenting clinical information in electronic health record. This note was generated in part or whole with voice recognition software. Voice recognition is usually quite accurate but there are transcription errors that can and very often do occur. I apologize for any typographical errors that were not detected and corrected.       Ursula Alert, MD 01/19/2020, 6:00 PM

## 2020-01-20 ENCOUNTER — Ambulatory Visit (INDEPENDENT_AMBULATORY_CARE_PROVIDER_SITE_OTHER): Payer: Medicare Other | Admitting: Family

## 2020-01-20 ENCOUNTER — Encounter: Payer: Self-pay | Admitting: Family

## 2020-01-20 VITALS — BP 140/102 | HR 96 | Ht 66.5 in | Wt 202.2 lb

## 2020-01-20 DIAGNOSIS — I10 Essential (primary) hypertension: Secondary | ICD-10-CM | POA: Diagnosis not present

## 2020-01-20 DIAGNOSIS — I4821 Permanent atrial fibrillation: Secondary | ICD-10-CM | POA: Diagnosis not present

## 2020-01-20 DIAGNOSIS — F419 Anxiety disorder, unspecified: Secondary | ICD-10-CM | POA: Diagnosis not present

## 2020-01-20 DIAGNOSIS — Z7901 Long term (current) use of anticoagulants: Secondary | ICD-10-CM | POA: Diagnosis not present

## 2020-01-20 DIAGNOSIS — I5032 Chronic diastolic (congestive) heart failure: Secondary | ICD-10-CM | POA: Diagnosis not present

## 2020-01-20 MED ORDER — HYDROCHLOROTHIAZIDE 25 MG PO TABS: 25.0000 mg | ORAL_TABLET | Freq: Every day | ORAL | 11 refills | Status: DC

## 2020-01-20 NOTE — Progress Notes (Signed)
Office Visit    Patient Name: Brandy Armstrong Date of Encounter: 01/20/2020  Primary Care Provider:  [name], FNP Primary Cardiologist:  Ida Rogue, MD Electrophysiologist:  None   Chief Complaint    Brandy Armstrong is a 74 y.o. female with a hx of permanent atrial fibrillation, aortic atherosclerosis, chronic diastolic heart failure, COPD, HTN, anemia, anxiety presents today for elevated blood pressures  Past Medical History    Past Medical History:  Diagnosis Date  . A-fib ([place])   . Barretts esophagus   . Chest pain    a. 02/2014 Myoview: Ef 50%, no ischemia.  . Colon polyps   . COPD (chronic obstructive pulmonary disease) ([place])   . Depression with anxiety   . GERD (gastroesophageal reflux disease)   . Hx of benign essential tremor   . Neuropathy   . Permanent atrial fibrillation [facility])    a. s/p ablation 01/2012 in [facility] by Dr. Boyd Kerbs;  b. On sotalol & Xarelto;  c. 02/2014 Echo: EF 50-55%, mild conc LVH, nl LA size/structure;  d. Recurrent afib 8/15 & 08/29/2014.  [name] Rectal fistula   . Rheumatoid arthritis ([place])    On methotrexate and orencia  . Urinary incontinence   . Vitamin D deficiency    Past Surgical History:  Procedure Laterality Date  . ABDOMINAL HYSTERECTOMY  1992  . APPENDECTOMY    . CARDIAC ELECTROPHYSIOLOGY STUDY AND ABLATION  2013  . CHOLECYSTECTOMY  1987  . COLONOSCOPY N/A 05/08/2015   Procedure: COLONOSCOPY;  Surgeon: Manya Silvas, MD;  Location: [facility] ENDOSCOPY;  Service: Endoscopy;  Laterality: N/A;  . COLONOSCOPY WITH PROPOFOL N/A 09/29/2019   Procedure: COLONOSCOPY WITH PROPOFOL;  Surgeon: Toledo, Benay Pike, MD;  Location: [facility] ENDOSCOPY;  Service: Gastroenterology;  Laterality: N/A;  . ESOPHAGOGASTRODUODENOSCOPY N/A 05/06/2015   Procedure: ESOPHAGOGASTRODUODENOSCOPY (EGD);  Surgeon: Lollie Sails, MD;  Location: [facility] ENDOSCOPY;  Service: Endoscopy;  Laterality: N/A;  . ESOPHAGOGASTRODUODENOSCOPY (EGD) WITH PROPOFOL N/A 09/29/2019    Procedure: ESOPHAGOGASTRODUODENOSCOPY (EGD) WITH PROPOFOL;  Surgeon: Toledo, Benay Pike, MD;  Location: [facility] ENDOSCOPY;  Service: Gastroenterology;  Laterality: N/A;  . LAPAROSCOPIC APPENDECTOMY N/A 02/19/2019   Procedure: APPENDECTOMY LAPAROSCOPIC converted to open appendectomy;  Surgeon: Herbert Pun, MD;  Location: [facility] ORS;  Service: General;  Laterality: N/A;  . OOPHORECTOMY    . oophrectomy Bilateral 1992    Allergies  Allergies  Allergen Reactions  . Latex     Itching Itching    History of Present Illness    Brandy Armstrong is a 74 y.o. female with a hx of pertinent atrial fibrillation on Eliquis s/p ablation 2010 with recurrent atrial fibrillation s/p repeat ablation in 01/2015 s/p DCCV 02/2015 s/p DCCV 08/2016 with recurrent A. fib 1 month later leading to the discontinuation of amiodarone and rate control strategy since, aortic atherosclerosis, chronic diastolic heart failure, GI bleed on Xarelto, COPD secondary to tobacco abuse quitting in 2012, HTN, anemia, anxiety.  Last seen 05/19/2019 by Christell Faith, PA.  Admitted 02/19/2019-03/08/2019 for acute appendicitis complicated by perforation.  She was in atrial fibrillation RVR, respiratory failure which required mechanical ventilation, and encephalopathy.  Echo with LVEF greater than 65%, normal RV SF, no significant valvular abnormalities.  Rates were difficult at all postoperatively.  TEE/DCCV was considered but recommended rate control she had not previously held sinus rhythm post cardioversion.  Her dry weight is approximately 200 pounds.  She has had visits in the last year for lower extremity edema.  There is some concern that her lower extremity edema is not only secondary diastolic heart failure possibly due to third spacing from hypoalbuminemia.  At visit with 05/19/2019 she noted elevated blood pressures however she had been checking on a wrist cuff.  She called our office a few days ago with reports of elevated blood  pressures.  She tells me she has been checking with an arm cuff that she purchase.  Blood pressures when checked over the last 4 days have been 137/82- 172/110.  She had 1 outlier of a low blood sugar pressure of 116/75.  Reports no lightheadedness, dizziness, near syncope.  She reports no headache.  She does report that she feels more fatigued with these elevated blood pressures and that is what led her to check her pressure.  EKGs/Labs/Other Studies Reviewed:   The following studies were reviewed today:   EKG:  EKG is ordered today.  The ekg ordered today demonstrates rate controlled atrial fibrillation 96 bpm with low voltage QRS likely due to body habitus.  Recent Labs: 03/02/2019: Magnesium 1.9 04/15/2019: ALT 7 05/12/2019: TSH 3.25 07/09/2019: BUN 10; Potassium 3.9; Sodium 139 10/10/2019: Creatinine, Ser 0.90 01/10/2020: Hemoglobin 10.2; Platelets 212  Recent Lipid Panel    Component Value Date/Time   CHOL 155 10/12/2018 1040   TRIG 3,560 (H) 02/19/2019 2257   HDL 38.90 (L) 10/12/2018 1040   CHOLHDL 4 10/12/2018 1040   VLDL 35.8 10/12/2018 1040   LDLCALC 80 10/12/2018 1040    Home Medications   Current Meds  Medication Sig  . albuterol (PROVENTIL HFA;VENTOLIN HFA) 108 (90 Base) MCG/ACT inhaler Inhale 1-2 puffs into the lungs every 4 (four) hours as needed for wheezing or shortness of breath.  . busPIRone (BUSPAR) 10 MG tablet Take 1 tablet (10 mg total) by mouth 3 (three) times daily.  . clonazePAM (KLONOPIN) 0.5 MG tablet Take 1/2 tablet daily if needed for anxiety. (Patient taking differently: 0.25 mg 2 (two) times daily as needed. Take 1/2 tablet daily if needed for anxiety.)  . CREON 36000 units CPEP capsule Takes 2 capsules three times daily.  [name] ELIQUIS 5 MG TABS tablet Take 1 tablet (5 mg total) by mouth 2 (two) times daily.  [name] esomeprazole (NEXIUM) 20 MG capsule TAKE 1 CAPSULE BY MOUTH EVERY DAY  . Fluticasone-Umeclidin-Vilant (TRELEGY ELLIPTA) 100-62.5-25 MCG/INH AEPB  Inhale 1 puff into the lungs daily.  . furosemide (LASIX) 20 MG tablet Take 1 tablet (20 mg) by mouth daily, you may take 1 extra tablet (20 mg) after lunch as needed for increased swelling, weight gain, shortness of breath  . gabapentin (NEURONTIN) 600 MG tablet TAKE 1 TABLET BY MOUTH THREE TIMES A DAY  . HUMIRA PEN 40 MG/0.4ML PNKT Inject 40 mg as directed. Every 2 weeks  . hydroxychloroquine (PLAQUENIL) 200 MG tablet Take 200 mg by mouth 2 (two) times daily.   . hydrOXYzine (ATARAX/VISTARIL) 50 MG tablet Take 0.5-1 tablets (25-50 mg total) by mouth daily as needed for anxiety. Patient has supplies  . hyoscyamine (LEVSIN) 0.125 MG tablet Take 0.125 mg by mouth as needed.  . lidocaine (XYLOCAINE) 5 % ointment Apply 1 application topically as needed.  . lipase/protease/amylase (CREON) 36000 UNITS CPEP capsule Take by mouth.  . potassium chloride (KLOR-CON) 10 MEQ tablet TAKE 1 TABLET (10 MEQ TOTAL) BY MOUTH DAILY AS NEEDED.  [name] propranolol ER (INDERAL LA) 120 MG 24 hr capsule Take 1 capsule (120 mg total) by mouth daily.  . sertraline (ZOLOFT) 50 MG tablet TAKE  1 TABLET BY MOUTH EVERY DAY  . traMADol (ULTRAM) 50 MG tablet Take 1 tablet (50 mg total) by mouth every 12 (twelve) hours as needed for severe pain.  . traZODone (DESYREL) 50 MG tablet Take 1 tablet (50 mg total) by mouth at bedtime.      Review of Systems       Review of Systems  Constitution: Positive for malaise/fatigue. Negative for chills and fever.  Cardiovascular: Negative for chest pain, dyspnea on exertion, irregular heartbeat, leg swelling, near-syncope, orthopnea, palpitations and syncope.       (+) elevated blood pressures.   Respiratory: Negative for cough, shortness of breath and wheezing.   Gastrointestinal: Negative for melena, nausea and vomiting.  Genitourinary: Negative for hematuria.  Neurological: Negative for dizziness, light-headedness and weakness.  Psychiatric/Behavioral: The patient is nervous/anxious.     All other systems reviewed and are otherwise negative except as noted above.  Physical Exam    VS:  BP (!) 140/102 (BP Location: Left Arm, Patient Position: Sitting, Cuff Size: Normal)   Pulse 96   Ht 5' 6.5" (1.689 m)   Wt 202 lb 4 oz (91.7 kg)   SpO2 96%   BMI 32.15 kg/m  , BMI Body mass index is 32.15 kg/m. GEN: Well nourished, overweight, well developed, in no acute distress. HEENT: normal. Neck: Supple, no JVD, carotid bruits, or masses. Cardiac: irregularly irregular, no murmurs, rubs, or gallops. No clubbing, cyanosis, edema.  Radials/DP/PT 2+ and equal bilaterally.  Respiratory:  Respirations regular and unlabored, clear to auscultation bilaterally. GI: Soft, nontender, nondistended, BS + x 4. MS: No deformity or atrophy. Skin: Warm and dry, no rash. Neuro:  Strength and sensation are intact. Psych: Normal affect.  Assessment & Plan    1. Hypertension - BP elevated today 140/102 by CMA and 136/90 on my check. Multiple elevated BP at home on arm cuff.  Start Hydrochlorothiazide 25 mg daily.  BMET today.  She will check blood pressure once per day at home and report blood pressure consistently greater than 130/80. 2. Permanent atrial fibrillation on chronic anticoagulation - Rate controlled today. Continue Propranolol at current dose. Continue Eliquis 5mg  BID, denies bleeding complications.  3. HFpEF - Euvolemic, compensated.  No LE edema. Reports she is only taking her Lasix PRN approx every 2 weeks. Endorses low sodium diet. GDMT includes propranolol.  4. COPD - Reports her DOE is at baseline.  5. Anxiety - Likely contributory to elevated blood pressures. Follows with PCP.  6. Hx GI bleed -no evidence of recurrence.  Denies bleeding complications.  Continue anticoagulation in the setting of atrial fibrillation.   Disposition: Reports blood pressures in 2 weeks via My Chart. Follow up in 1 month(s) with Dr. Rockey Situ or APP.  Anticipate repeat BMET at follow up visit.    Loel Dubonnet, NP 01/20/2020, 11:25 AM

## 2020-01-20 NOTE — Patient Instructions (Addendum)
Medication Instructions:  Your physician has recommended you make the following change in your medication:   START Hydrochlorothiaze 25 mg (one tablet) daily   *If you need a refill on your cardiac medications before your next appointment, please call your pharmacy*  Lab Work: Your physician recommends that you return for lab work today: BMET  If you have labs (blood work) drawn today and your tests are completely normal, you will receive your results only by: [name] MyChart Message (if you have MyChart) OR . A paper copy in the mail If you have any lab test that is abnormal or we need to change your treatment, we will call you to review the results.  Testing/Procedures: You had an EKG today which showed atrial fibrillation which is a stable finding.  Follow-Up: At [facility], you and your health needs are our priority.  As part of our continuing mission to provide you with exceptional heart care, we have created designated Provider Care Teams.  These Care Teams include your primary Cardiologist (physician) and Advanced Practice Providers (APPs -  Physician Assistants and Nurse Practitioners) who all work together to provide you with the care you need, when you need it.  Your next appointment:  Follow up in 1 month in person with Dr. Rockey Situ. Please bring your blood pressure cuff to your next office visit.   Other:   Call us in 2 weeks or send a MyChart message with your blood pressure readings.   Hydrochlorothiazide, HCTZ Oral Capsules or Tablets What is this medicine? HYDROCHLOROTHIAZIDE (hye droe klor oh THYE a zide) is a diuretic. It helps you make more urine and to lose salt and excess water from your body. It treats swelling from heart, kidney, or liver disease. It also treats high blood pressure. This medicine may be used for other purposes; ask your health care provider or pharmacist if you have questions. COMMON BRAND NAME(S): Esidrix, Ezide, HydroDIURIL, Microzide, Oretic,  Zide What should I tell my health care provider before I take this medicine? They need to know if you have any of these conditions:  diabetes  gout  immune system problems, like lupus  kidney disease or kidney stones  liver disease  pancreatitis  small amount of urine or difficulty passing urine  an unusual or allergic reaction to hydrochlorothiazide, sulfa drugs, other medicines, foods, dyes, or preservatives  pregnant or trying to get pregnant  breast-feeding How should I use this medicine? Take this drug by mouth. Take it as directed on the prescription label at the same time every day. You can take it with or without food. If it upsets your stomach, take it with food. Keep taking it unless your health care provider tells you to stop. Talk to your health care provider about the use of this drug in children. While it may be prescribed for children as young as newborns for selected conditions, precautions do apply. Overdosage: If you think you have taken too much of this medicine contact a poison control center or emergency room at once. NOTE: This medicine is only for you. Do not share this medicine with others. What if I miss a dose? If you miss a dose, take it as soon as you can. If it is almost time for your next dose, take only that dose. Do not take double or extra doses. What may interact with this medicine?  cholestyramine  colestipol  digoxin  dofetilide  lithium  medicines for blood pressure  medicines for diabetes  medicines that  relax muscles for surgery  other diuretics  steroid medicines like prednisone or cortisone This list may not describe all possible interactions. Give your health care provider a list of all the medicines, herbs, non-prescription drugs, or dietary supplements you use. Also tell them if you smoke, drink alcohol, or use illegal drugs. Some items may interact with your medicine. What should I watch for while using this  medicine? Visit your doctor or health care professional for regular checks on your progress. Check your blood pressure as directed. Ask your doctor or health care professional what your blood pressure should be and when you should contact him or her. Talk to your health care professional about your risk of skin cancer. You may be more at risk for skin cancer if you take this medicine. This medicine can make you more sensitive to the sun. Keep out of the sun. If you cannot avoid being in the sun, wear protective clothing and use sunscreen. Do not use sun lamps or tanning beds/booths. You may need to be on a special diet while taking this medicine. Ask your doctor. Check with your doctor or health care professional if you get an attack of severe diarrhea, nausea and vomiting, or if you sweat a lot. The loss of too much body fluid can make it dangerous for you to take this medicine. You may get drowsy or dizzy. Do not drive, use machinery, or do anything that needs mental alertness until you know how this medicine affects you. Do not stand or sit up quickly, especially if you are an older patient. This reduces the risk of dizzy or fainting spells. Alcohol may interfere with the effect of this medicine. Avoid alcoholic drinks. This medicine may increase blood sugar. Ask your healthcare provider if changes in diet or medicines are needed if you have diabetes. What side effects may I notice from receiving this medicine? Side effects that you should report to your doctor or health care professional as soon as possible:  allergic reactions such as skin rash or itching, hives, swelling of the lips, mouth, tongue, or throat  changes in vision  chest pain  eye pain  fast or irregular heartbeat  feeling faint or lightheaded, falls  gout attack  muscle pain or cramps  pain or difficulty when passing urine  pain, tingling, numbness in the hands or feet  redness, blistering, peeling or loosening of  the skin, including inside the mouth   signs and symptoms of high blood sugar such as being more thirsty or hungry or having to urinate more than normal. You may also feel very tired or have blurry vision.  unusually weak Side effects that usually do not require medical attention (report to your doctor or health care professional if they continue or are bothersome):  change in sex drive or performance  dry mouth  headache  stomach upset This list may not describe all possible side effects. Call your doctor for medical advice about side effects. You may report side effects to FDA at 1-800-FDA-1088. Where should I keep my medicine? Keep out of the reach of children and pets. Store at room temperature between 20 and 25 degrees C (68 and 77 degrees F). Protect from light and moisture. Keep the container tightly closed. Do not freeze. Throw away any unused drug after the expiration date. NOTE: This sheet is a summary. It may not cover all possible information. If you have questions about this medicine, talk to your doctor, pharmacist, or health care provider.  2020 Elsevier/Gold Standard (2019-08-19 16:52:59)  Tips to Measure your Blood Pressure Correctly  To determine whether you have hypertension, a medical professional will take a blood pressure reading. How you prepare for the test, the position of your arm, and other factors can change a blood pressure reading by 10% or more. That could be enough to hide high blood pressure, start you on a drug you don't really need, or lead your doctor to incorrectly adjust your medications.  National and international guidelines offer specific instructions for measuring blood pressure. If a doctor, nurse, or medical assistant isn't doing it right, don't hesitate to ask him or her to get with the guidelines.  Here's what you can do to ensure a correct reading: . Don't drink a caffeinated beverage or smoke during the 30 minutes before the test. . Sit  quietly for five minutes before the test begins. . During the measurement, sit in a chair with your feet on the floor and your arm supported so your elbow is at about heart level. . The inflatable part of the cuff should completely cover at least 80% of your upper arm, and the cuff should be placed on bare skin, not over a shirt. . Don't talk during the measurement. . Have your blood pressure measured twice, with a brief break in between. If the readings are different by 5 points or more, have it done a third time.  There are times to break these rules. If you sometimes feel lightheaded when getting out of bed in the morning or when you stand after sitting, you should have your blood pressure checked while seated and then while standing to see if it falls from one position to the next.  Because blood pressure varies throughout the day, your doctor will rarely diagnose hypertension on the basis of a single reading. Instead, he or she will want to confirm the measurements on at least two occasions, usually within a few weeks of one another. The exception to this rule is if you have a blood pressure reading of 180/110 mm Hg or higher. A result this high usually calls for prompt treatment.  It's a good idea to have your blood pressure measured in both arms at least once, since the reading in one arm (usually the right) may be higher than that in the left. A 2014 study in The American Journal of Medicine of nearly 3,400 people found average arm- to-arm differences in systolic blood pressure of about 5 points. The higher number should be used to make treatment decisions.  In 2017, new guidelines from the [employer], the [employer] of Cardiology, and nine other health organizations lowered the diagnosis of high blood pressure to 130/80 mm Hg or higher for all adults. The guidelines also redefined the various blood pressure categories to now include normal, elevated, Stage 1 hypertension,  Stage 2 hypertension, and hypertensive crisis (see "Blood pressure categories").  Blood pressure categories  Blood pressure category SYSTOLIC (upper number)  DIASTOLIC (lower number)  Normal Less than 120 mm Hg and Less than 80 mm Hg  Elevated 120-129 mm Hg and Less than 80 mm Hg  High blood pressure: Stage 1 hypertension 130-139 mm Hg or 80-89 mm Hg  High blood pressure: Stage 2 hypertension 140 mm Hg or higher or 90 mm Hg or higher  Hypertensive crisis (consult your doctor immediately) Higher than 180 mm Hg and/or Higher than 120 mm Hg  Source: American Heart Association and American Stroke Association. For more  on getting your blood pressure under control, buy Controlling Your Blood Pressure, a Special Health Report from [facility].   Blood Pressure Log   Date   Time  Blood Pressure  Position  Example: Nov 1 9 AM 124/78 sitting

## 2020-01-21 ENCOUNTER — Telehealth: Payer: Self-pay

## 2020-01-21 DIAGNOSIS — I5032 Chronic diastolic (congestive) heart failure: Secondary | ICD-10-CM

## 2020-01-21 LAB — BASIC METABOLIC PANEL
BUN/Creatinine Ratio: 14 (ref 12–28)
BUN: 12 mg/dL (ref 8–27)
CO2: 24 mmol/L (ref 20–29)
Calcium: 9.1 mg/dL (ref 8.7–10.3)
Chloride: 104 mmol/L (ref 96–106)
Creatinine, Ser: 0.85 mg/dL (ref 0.57–1.00)
GFR calc Af Amer: 80 mL/min/{1.73_m2} (ref >59)
GFR calc non Af Amer: 69 mL/min/{1.73_m2} (ref >59)
Glucose: 96 mg/dL (ref 65–99)
Potassium: 3.3 mmol/L — ABNORMAL LOW (ref 3.5–5.2)
Sodium: 141 mmol/L (ref 134–144)

## 2020-01-21 NOTE — Telephone Encounter (Signed)
Call to patient to discuss lab results.   Ordered bmet for this coming Tuesday. Pt agreed to take K CL for 3 days and we will touch base for POC after labs next week.   Advised pt to call for any further questions or concerns.

## 2020-01-21 NOTE — Telephone Encounter (Signed)
-----   Message from Loel Dubonnet, NP sent at 01/21/2020  9:25 AM EST ----- Normal kidney function. Potassium was slightly low. She takes potassium PRN. Please have her take Potassium 73mEq x3 days and return for [facility] Tuesday. Thank you!

## 2020-01-25 ENCOUNTER — Other Ambulatory Visit
Admission: RE | Admit: 2020-01-25 | Discharge: 2020-01-25 | Disposition: A | Discharge status: Home / Self Care | Payer: Medicare Other | Source: Ambulatory Visit | Attending: Family | Admitting: Family

## 2020-01-25 ENCOUNTER — Telehealth: Payer: Self-pay

## 2020-01-25 ENCOUNTER — Other Ambulatory Visit: Payer: Self-pay

## 2020-01-25 DIAGNOSIS — I5032 Chronic diastolic (congestive) heart failure: Secondary | ICD-10-CM

## 2020-01-25 LAB — BASIC METABOLIC PANEL
Anion gap: 10 (ref 5–15)
BUN: 18 mg/dL (ref 8–23)
CO2: 28 mmol/L (ref 22–32)
Calcium: 9.3 mg/dL (ref 8.9–10.3)
Chloride: 97 mmol/L — ABNORMAL LOW (ref 98–111)
Creatinine, Ser: 1.03 mg/dL — ABNORMAL HIGH (ref 0.44–1.00)
GFR calc Af Amer: >60 mL/min (ref >60)
GFR calc non Af Amer: 55 mL/min — ABNORMAL LOW (ref >60)
Glucose, Bld: 99 mg/dL (ref 70–99)
Potassium: 3.2 mmol/L — ABNORMAL LOW (ref 3.5–5.1)
Sodium: 135 mmol/L (ref 135–145)

## 2020-01-25 NOTE — Telephone Encounter (Signed)
-----   Message from Loel Dubonnet, NP sent at 01/25/2020 12:49 PM EST ----- Potassium level remains low. Kidney function with mild decreased. This could be due to a combination of the HCTZ and dehydration. The HCTZ may require a more regular dosing of potassium because it is a diuretic. Recommendations:  Reduce HCTZ to 12.5mg  (half tablet) daily. K 60mEq today. Then K 73mEq daily.  Repeat BMET in 1 week.She may need refill of her K.

## 2020-01-25 NOTE — Telephone Encounter (Signed)
-----   Message from Loel Dubonnet, NP sent at 01/25/2020 12:49 PM EST ----- Potassium level remains low. Kidney function with mild decreased. This could be due to a combination of the HCTZ and dehydration. The HCTZ may require a more regular dosing of potassium because it is a diuretic. Recommendations:  Reduce HCTZ to 12.5mg  (half tablet) daily. K 29mEq today. Then K 69mEq daily.  Repeat BMET in 1 week.She may need refill of her K.

## 2020-01-25 NOTE — Telephone Encounter (Signed)
LVM to call back.

## 2020-01-25 NOTE — Telephone Encounter (Signed)
Called and spoke with Brandy Armstrong regarding her lab results and recommendations as per Laurann Montana, NP.  Pt was instructed to make the following changes: Reduce HCTZ to 12.5mg  (half tablet) daily.  K 53mEq today. Then K 54mEq daily.  Repeat BMET in 1 week.  Pt verbalized understanding and will have repeat labs on 02/02/20 1 week after medication changes. Pt states that she had already taken her HCTZ this am.    BMET order was placed.

## 2020-01-26 ENCOUNTER — Other Ambulatory Visit: Payer: Self-pay

## 2020-01-26 ENCOUNTER — Encounter: Payer: Self-pay | Admitting: Oncology

## 2020-01-26 ENCOUNTER — Inpatient Hospital Stay: Payer: Medicare Other

## 2020-01-26 ENCOUNTER — Inpatient Hospital Stay (HOSPITAL_BASED_OUTPATIENT_CLINIC_OR_DEPARTMENT_OTHER): Payer: Medicare Other | Admitting: Oncology

## 2020-01-26 VITALS — BP 125/63 | HR 90 | Temp 98.7°F | Resp 18 | Wt 195.6 lb

## 2020-01-26 DIAGNOSIS — J449 Chronic obstructive pulmonary disease, unspecified: Secondary | ICD-10-CM | POA: Diagnosis not present

## 2020-01-26 DIAGNOSIS — D500 Iron deficiency anemia secondary to blood loss (chronic): Secondary | ICD-10-CM

## 2020-01-26 DIAGNOSIS — D508 Other iron deficiency anemias: Secondary | ICD-10-CM | POA: Diagnosis not present

## 2020-01-26 DIAGNOSIS — Z87891 Personal history of nicotine dependence: Secondary | ICD-10-CM | POA: Diagnosis not present

## 2020-01-26 DIAGNOSIS — I4821 Permanent atrial fibrillation: Secondary | ICD-10-CM | POA: Diagnosis not present

## 2020-01-26 DIAGNOSIS — Z7901 Long term (current) use of anticoagulants: Secondary | ICD-10-CM | POA: Diagnosis not present

## 2020-01-26 DIAGNOSIS — E538 Deficiency of other specified B group vitamins: Secondary | ICD-10-CM | POA: Diagnosis not present

## 2020-01-26 DIAGNOSIS — Z79899 Other long term (current) drug therapy: Secondary | ICD-10-CM | POA: Diagnosis not present

## 2020-01-26 MED ORDER — SODIUM CHLORIDE 0.9 % IV SOLN
INTRAVENOUS | Status: DC
  Filled 2020-01-26: qty 250

## 2020-01-26 MED ORDER — IRON SUCROSE 20 MG/ML IV SOLN
200.0000 mg | Freq: Once | INTRAVENOUS | Status: AC
  Administered 2020-01-26: 200 mg via INTRAVENOUS
  Filled 2020-01-26: qty 10

## 2020-01-26 NOTE — Progress Notes (Signed)
Patient here for follow. Pt reports feeling incresed tiredness, lethargy and stomach cramping. Pt has lost approx 10 pounds since last visit in September, she reports not eating very well.

## 2020-01-27 ENCOUNTER — Encounter: Payer: Self-pay | Admitting: Oncology

## 2020-01-27 NOTE — Progress Notes (Signed)
Hematology/Oncology Follow up note [facility] [phone] [phone]   Patient Care Team: Brandy Hawthorne, FNP as PCP - General (Family Medicine) Brandy Merritts, MD as PCP - Cardiology (Cardiology)  REFERRING PROVIDER: [name], FNP  REASON FOR VISIT Follow up for treatment of anemia and iron deficiency anemia.   HISTORY OF PRESENTING ILLNESS:  Brandy Armstrong is a  74 y.o.  female with PMH listed below who was referred to me for evaluation of anemia and elevated iron level.  Patient recently had lab work done which revealed anemia with hemoglobin count at 10.2 on 05/04/2018.  I reviewed patient's previous labs, Anemia is chronic, dated back to 2014. She recalls having 1 PRBC transfusion in the past and was told she has iron deficiency anemia. She takes oral OTC  iron supplements and anemia has improved.  Associated with fatigue, lack of energy. Denies weight loss, easy bruising, hematochezia, hemoptysis.  Last colonoscopy 2016 revealed polyps which were removed. No malignancy was found.  Last EGD: 2016 which showed gastritis.   Reviewed recent iron panel which showed elevated iron at 346, TSAT elevated at 89, ferritin 26. Denies any family history of hemachromatosis.  Recent B12 level showed decreased B12, she reports that she has been started on parental B12 injections with her PCP.  History RF, she takes plaquenil 200mg  daily.  History of Afib, takes Elqiuis 5mg  BID.   # UA on 06/22/2019 showed no hematuria. Patient takes 0.25 mg Klonopin twice daily.  PCP recommended a trial of BuSpar and counseling.  INTERVAL HISTORY Brandy Armstrong is a 74 y.o. female who has above history reviewed by me today presents for follow-up for management of iron deficiency anemia.  Patient is on Eliquis for atrial fibrillation.   Patient reports feeling tired.  Denies any bleeding events. Chronic fatigue is worse. Denies any shortness of breath,  fever, chills, chest pain, abdominal pain. She was recently started on Creon for pancreatic insufficiency Review of Systems  Constitutional: Positive for malaise/fatigue. Negative for chills, fever and weight loss.  HENT: Negative for sore throat.   Eyes: Negative for redness.  Respiratory: Negative for cough, shortness of breath and wheezing.   Cardiovascular: Negative for chest pain, palpitations and leg swelling.  Gastrointestinal: Negative for abdominal pain, blood in stool, nausea and vomiting.  Genitourinary: Negative for dysuria.  Musculoskeletal: Negative for myalgias.  Skin: Negative for rash.  Neurological: Negative for dizziness, tingling and tremors.  Endo/Heme/Allergies: Does not bruise/bleed easily.  Psychiatric/Behavioral: Negative for hallucinations.       Anxiety    MEDICAL HISTORY:  Past Medical History:  Diagnosis Date  . A-fib ([place])   . Barretts esophagus   . Chest pain    a. 02/2014 Myoview: Ef 50%, no ischemia.  . Colon polyps   . COPD (chronic obstructive pulmonary disease) ([place])   . Depression with anxiety   . GERD (gastroesophageal reflux disease)   . Hx of benign essential tremor   . Neuropathy   . Permanent atrial fibrillation [facility])    a. s/p ablation 01/2012 in [facility] by Dr. Boyd Armstrong;  b. On sotalol & Xarelto;  c. 02/2014 Echo: EF 50-55%, mild conc LVH, nl LA size/structure;  d. Recurrent afib 8/15 & 08/29/2014.  [name] Rectal fistula   . Rheumatoid arthritis ([place])    On methotrexate and orencia  . Urinary incontinence   . Vitamin D deficiency     SURGICAL HISTORY: Past Surgical History:  Procedure Laterality  Date  . ABDOMINAL HYSTERECTOMY  1992  . APPENDECTOMY    . CARDIAC ELECTROPHYSIOLOGY STUDY AND ABLATION  2013  . CHOLECYSTECTOMY  1987  . COLONOSCOPY N/A 05/08/2015   Procedure: COLONOSCOPY;  Surgeon: Brandy Silvas, MD;  Location: [facility] ENDOSCOPY;  Service: Endoscopy;  Laterality: N/A;  . COLONOSCOPY WITH PROPOFOL N/A 09/29/2019   Procedure:  COLONOSCOPY WITH PROPOFOL;  Surgeon: Toledo, Benay Pike, MD;  Location: [facility] ENDOSCOPY;  Service: Gastroenterology;  Laterality: N/A;  . ESOPHAGOGASTRODUODENOSCOPY N/A 05/06/2015   Procedure: ESOPHAGOGASTRODUODENOSCOPY (EGD);  Surgeon: Brandy Sails, MD;  Location: [facility] ENDOSCOPY;  Service: Endoscopy;  Laterality: N/A;  . ESOPHAGOGASTRODUODENOSCOPY (EGD) WITH PROPOFOL N/A 09/29/2019   Procedure: ESOPHAGOGASTRODUODENOSCOPY (EGD) WITH PROPOFOL;  Surgeon: Toledo, Benay Pike, MD;  Location: [facility] ENDOSCOPY;  Service: Gastroenterology;  Laterality: N/A;  . LAPAROSCOPIC APPENDECTOMY N/A 02/19/2019   Procedure: APPENDECTOMY LAPAROSCOPIC converted to open appendectomy;  Surgeon: Brandy Pun, MD;  Location: [facility] ORS;  Service: General;  Laterality: N/A;  . OOPHORECTOMY    . oophrectomy Bilateral 1992    SOCIAL HISTORY: Social History   Socioeconomic History  . Marital status: Single    Spouse name: Not on file  . Number of children: 0  . Years of education: 16  . Highest education level: Bachelor's degree (e.g., BA, AB, BS)  Occupational History  . Occupation: Retired  Tobacco Use  . Smoking status: Former Smoker    Packs/day: 1.00    Years: 40.00    Pack years: 40.00    Quit date: 12/16/2011    Years since quitting: 8.1  . Smokeless tobacco: Never Used  Substance and Sexual Activity  . Alcohol use: Not Currently    Alcohol/week: 0.0 standard drinks    Comment: Occasionally has a drink  . Drug use: No  . Sexual activity: Never  Other Topics Concern  . Not on file  Social History Narrative   Brandy Armstrong was born and reared in [place]. She attended [employer] and graduated in 1971 with her [place] in Education. 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 She then worked for a [employer] in [place]. She is single. She is currently retired. She loves reading. She loves to spend time with her dog.       Caffeine use: 2  cups coffee per day   2 glasses iced tea/ day   Right-handed   Social Determinants of Health   Financial Resource Strain: Medium Risk  . Difficulty of Paying Living Expenses: Somewhat hard  Food Insecurity: Food Insecurity Present  . Worried About Charity fundraiser in the Last Year: Sometimes true  . Ran Out of Food in the Last Year: Sometimes true  Transportation Needs: No Transportation Needs  . Lack of Transportation (Medical): No  . Lack of Transportation (Non-Medical): No  Physical Activity: Inactive  . Days of Exercise per Week: 0 days  . Minutes of Exercise per Session: 0 min  Stress: Stress Concern Present  . Feeling of Stress : To some extent  Social Connections: Unknown  . Frequency of Communication with Friends and Family: Not on file  . Frequency of Social Gatherings with Friends and Family: Not on file  . Attends Religious Services: More than 4 times per year  . Active Member of Clubs or Organizations: No  . Attends Archivist Meetings: Never  . Marital Status: Never married  Intimate Partner Violence: Not At Risk  . Fear  of Current or Ex-Partner: No  . Emotionally Abused: No  . Physically Abused: No  . Sexually Abused: No    FAMILY HISTORY: Family History  Problem Relation Age of Onset  . Depression Mother   . Pancreatic cancer Mother   . Suicidality Mother   . Colon cancer Father   . Breast cancer Sister 91  . Diabetes Brother   . Breast cancer Cousin   . Neuropathy Neg Hx     ALLERGIES:  is allergic to latex.  MEDICATIONS:  Current Outpatient Medications  Medication Sig Dispense Refill  . albuterol (PROVENTIL HFA;VENTOLIN HFA) 108 (90 Base) MCG/ACT inhaler Inhale 1-2 puffs into the lungs every 4 (four) hours as needed for wheezing or shortness of breath. 1 Inhaler 10  . busPIRone (BUSPAR) 10 MG tablet Take 1 tablet (10 mg total) by mouth 3 (three) times daily. 270 tablet 0  . clonazePAM (KLONOPIN) 0.5 MG tablet Take 1/2 tablet daily if  needed for anxiety. (Patient taking differently: 0.25 mg 2 (two) times daily as needed. Take 1/2 tablet daily if needed for anxiety.) 15 tablet 0  . CREON 36000 units CPEP capsule Takes 2 capsules three times daily.    [name] ELIQUIS 5 MG TABS tablet Take 1 tablet (5 mg total) by mouth 2 (two) times daily. 180 tablet 3  . esomeprazole (NEXIUM) 20 MG capsule TAKE 1 CAPSULE BY MOUTH EVERY DAY 90 capsule 1  . Fluticasone-Umeclidin-Vilant (TRELEGY ELLIPTA) 100-62.5-25 MCG/INH AEPB Inhale 1 puff into the lungs daily. 60 each 2  . gabapentin (NEURONTIN) 600 MG tablet TAKE 1 TABLET BY MOUTH THREE TIMES A DAY 90 tablet 1  . HUMIRA PEN 40 MG/0.4ML PNKT Inject 40 mg as directed. Every 2 weeks    . hydrochlorothiazide (HYDRODIURIL) 25 MG tablet Take 1 tablet (25 mg total) by mouth daily. 30 tablet 11  . hydroxychloroquine (PLAQUENIL) 200 MG tablet Take 200 mg by mouth 2 (two) times daily.     . hydrOXYzine (ATARAX/VISTARIL) 50 MG tablet Take 0.5-1 tablets (25-50 mg total) by mouth daily as needed for anxiety. Patient has supplies 90 tablet 0  . hyoscyamine (LEVSIN) 0.125 MG tablet Take 0.125 mg by mouth as needed.    . potassium chloride (KLOR-CON) 10 MEQ tablet TAKE 1 TABLET (10 MEQ TOTAL) BY MOUTH DAILY AS NEEDED. 90 tablet 0  . propranolol ER (INDERAL LA) 120 MG 24 hr capsule Take 1 capsule (120 mg total) by mouth daily. 90 capsule 3  . sertraline (ZOLOFT) 50 MG tablet TAKE 1 TABLET BY MOUTH EVERY DAY 90 tablet 0  . traZODone (DESYREL) 50 MG tablet Take 1 tablet (50 mg total) by mouth at bedtime. 90 tablet 0  . furosemide (LASIX) 20 MG tablet Take 1 tablet (20 mg) by mouth daily, you may take 1 extra tablet (20 mg) after lunch as needed for increased swelling, weight gain, shortness of breath (Patient not taking: Reported on 01/26/2020) 90 tablet 2  . lidocaine (XYLOCAINE) 5 % ointment Apply 1 application topically as needed.    . lipase/protease/amylase (CREON) 36000 UNITS CPEP capsule Take by mouth.    .  traMADol (ULTRAM) 50 MG tablet Take 1 tablet (50 mg total) by mouth every 12 (twelve) hours as needed for severe pain. (Patient not taking: Reported on 01/26/2020) 30 tablet 0   No current facility-administered medications for this visit.     PHYSICAL EXAMINATION: ECOG PERFORMANCE STATUS: 1 - Symptomatic but completely ambulatory Vitals:   01/26/20 1305  BP: 125/63  Pulse:  90  Resp: 18  Temp: 98.7 F (37.1 C)   Filed Weights   01/26/20 1305  Weight: 195 lb 9.6 oz (88.7 kg)    Physical Exam Constitutional:      General: She is not in acute distress.    Appearance: She is obese.     Comments: Walks with a walker  HENT:     Head: Normocephalic and atraumatic.  Eyes:     General: No scleral icterus.    Conjunctiva/sclera: Conjunctivae normal.     Pupils: Pupils are equal, round, and reactive to light.  Cardiovascular:     Rate and Rhythm: Normal rate and regular rhythm.     Heart sounds: Normal heart sounds.  Pulmonary:     Effort: Pulmonary effort is normal. No respiratory distress.     Breath sounds: No wheezing.     Comments: Decreased breath sound bilaterally Chest:     Chest wall: No tenderness.  Abdominal:     General: Bowel sounds are normal. There is no distension.     Palpations: Abdomen is soft. There is no mass.     Tenderness: There is no abdominal tenderness.  Musculoskeletal:        General: No deformity. Normal range of motion.     Cervical back: Normal range of motion and neck supple.  Lymphadenopathy:     Cervical: No cervical adenopathy.  Skin:    General: Skin is warm and dry.     Findings: No erythema or rash.  Neurological:     Mental Status: She is alert and oriented to person, place, and time. Mental status is at baseline.     Cranial Nerves: No cranial nerve deficit.     Coordination: Coordination normal.  Psychiatric:        Mood and Affect: Mood normal.      LABORATORY DATA:  I have reviewed the data as listed Lab Results    Component Value Date   WBC 8.4 01/10/2020   HGB 10.2 (L) 01/10/2020   HCT 35.4 (L) 01/10/2020   MCV 82.7 01/10/2020   PLT 212 01/10/2020   Recent Labs    02/21/19 0443 02/21/19 0443 02/22/19 0641 02/23/19 0522 04/15/19 0901 05/27/19 1149 07/09/19 1334 07/09/19 1334 10/10/19 0820 01/20/20 1151 01/25/20 1204  NA 138   < > 138   < > 139   < > 139  --   --  141 135  K 4.3   < > 4.4   < > 3.8   < > 3.9  --   --  3.3* 3.2*  CL 109   < > 106   < > 102   < > 104  --   --  104 97*  CO2 21*   < > 24   < > 29   < > 27  --   --  24 28  GLUCOSE 116*   < > 105*   < > 81   < > 99  --   --  96 99  BUN 16   < > 15   < > 9   < > 10  --   --  12 18  CREATININE 0.77   < > 0.69   < > 0.87   < > 0.79   < > 0.90 0.85 1.03*  CALCIUM 8.5*   < > 8.8*   < > 8.7   < > 9.1  --   --  9.1 9.3  GFRNONAA >  60   < > >60   < >  --    < > >60  --   --  69 55*  GFRAA >60   < > >60   < >  --    < > >60  --   --  80 >60  PROT 6.1*  --  6.3*  --  6.2  --   --   --   --   --   --   ALBUMIN 2.8*  --  2.7*  --  3.1*  --   --   --   --   --   --   AST 19  --  16  --  21  --   --   --   --   --   --   ALT 10  --  9  --  7  --   --   --   --   --   --   ALKPHOS 50  --  53  --  82  --   --   --   --   --   --   BILITOT 0.8  --  0.7  --  0.6  --   --   --   --   --   --    < > = values in this interval not displayed.       ASSESSMENT & PLAN:  1. Iron deficiency anemia due to chronic blood loss   2. Chronic anticoagulation   Labs reviewed and discussed with patient. #Iron deficiency Anemia:  Iron panel continues to show decreased saturation at 10.  Ferritin 16. Patient's hemoglobin is 10.2, decreased from last visit. I recommend IV Venofer 200 mg weekly x1. She may have ongoing blood loss from GI tract in the context of chronic anticoagulation. 09/29/2019.  GI work-up Colonoscopy showed level stenosis, internal hemorrhoid, small ascending colon polyps removed.   Endoscopy showed esophageal mucosal changes  secondary to long segment Barrett's disease.  Gastritis. Discussed with patient that she may have blood loss from Barrett disease or history of small bowel AVMs. She will need periodic IV iron treatments.  All questions were answered. The patient knows to call the clinic with any problems questions or concerns.  Orders Placed This Encounter  Procedures  . CBC with Differential/Platelet    Standing Status:   Future    Standing Expiration Date:   01/26/2021  . Iron and TIBC    Standing Status:   Future    Standing Expiration Date:   01/26/2021  . Ferritin    Standing Status:   Future    Standing Expiration Date:   01/26/2021    Return of visit:   4 months    Earlie Server, MD, PhD 01/27/2020

## 2020-01-29 ENCOUNTER — Other Ambulatory Visit: Payer: Self-pay | Admitting: Psychiatry

## 2020-01-29 DIAGNOSIS — F3341 Major depressive disorder, recurrent, in partial remission: Secondary | ICD-10-CM

## 2020-01-29 DIAGNOSIS — F411 Generalized anxiety disorder: Secondary | ICD-10-CM

## 2020-02-02 ENCOUNTER — Other Ambulatory Visit
Admission: RE | Admit: 2020-02-02 | Discharge: 2020-02-02 | Disposition: A | Discharge status: Home / Self Care | Payer: Medicare Other | Source: Ambulatory Visit | Attending: Family | Admitting: Family

## 2020-02-02 DIAGNOSIS — I5032 Chronic diastolic (congestive) heart failure: Secondary | ICD-10-CM | POA: Insufficient documentation

## 2020-02-02 LAB — BASIC METABOLIC PANEL
Anion gap: 12 (ref 5–15)
BUN: 20 mg/dL (ref 8–23)
CO2: 24 mmol/L (ref 22–32)
Calcium: 9.4 mg/dL (ref 8.9–10.3)
Chloride: 100 mmol/L (ref 98–111)
Creatinine, Ser: 1.08 mg/dL — ABNORMAL HIGH (ref 0.44–1.00)
GFR calc Af Amer: 60 mL/min — ABNORMAL LOW (ref >60)
GFR calc non Af Amer: 52 mL/min — ABNORMAL LOW (ref >60)
Glucose, Bld: 105 mg/dL — ABNORMAL HIGH (ref 70–99)
Potassium: 4.4 mmol/L (ref 3.5–5.1)
Sodium: 136 mmol/L (ref 135–145)

## 2020-02-04 ENCOUNTER — Other Ambulatory Visit: Payer: Self-pay

## 2020-02-04 ENCOUNTER — Inpatient Hospital Stay: Payer: Medicare Other | Attending: Oncology

## 2020-02-04 VITALS — BP 112/76 | HR 61 | Temp 98.0°F | Resp 20

## 2020-02-04 DIAGNOSIS — D508 Other iron deficiency anemias: Secondary | ICD-10-CM | POA: Insufficient documentation

## 2020-02-04 DIAGNOSIS — D500 Iron deficiency anemia secondary to blood loss (chronic): Secondary | ICD-10-CM

## 2020-02-04 MED ORDER — IRON SUCROSE 20 MG/ML IV SOLN
200.0000 mg | Freq: Once | INTRAVENOUS | Status: AC
  Administered 2020-02-04: 14:00:00 200 mg via INTRAVENOUS
  Filled 2020-02-04: qty 10

## 2020-02-04 MED ORDER — SODIUM CHLORIDE 0.9 % IV SOLN
INTRAVENOUS | Status: DC
  Filled 2020-02-04: qty 250

## 2020-02-07 ENCOUNTER — Telehealth: Payer: Self-pay | Admitting: Family

## 2020-02-07 NOTE — Telephone Encounter (Signed)
Heather,   Agree she seems volume depleted. Would recommend she check her blood pressure prior to taking her HCTZ. If her systolic blood pressure is less than 110 would recommend she not take her HCTZ or potassium that day. Recommend she make sure she is drinking enough water to keep her urine clear or pale yellow in color. Tea is a diuretic so would recommend she drink water as well.   Loel Dubonnet, NP

## 2020-02-07 NOTE — Telephone Encounter (Signed)
I spoke with the patient. She was last seen in clinic on 1/21 with Laurann Montana, NP.  She was started on HCTZ 25 mg once daily at that time. She had a BMP checked on 1/26 and her K+ was 3.2 & creatinine 1.03. The patient was instructed to decrease HCTZ to 12.5 mg once daily.  She was give potassium 40 meq x 1 dose, then instructed to take 10 meq once daily.  The patient called today stating her BP has been periodically running low. 2/1- 96/69 & 81-52 2/2 - "normal" (120s) 2/3- "normal" (120s) 2/4- 102/77 2/5- no reading 2/6- no reading 2/7- 126/78 (10 am) 2/8- 106/62 (1pm)/ 90/66 (1:30 pm)  The patient states she is no longer taking lasix (will remove from her med list). She is taking: HCTZ- 12.5 mg daily Propranolol- 120 mg daily K+- 10 meq daily  She is not drinking water, but will drink tea (today was caffeinated) all day. She had some diet pepsi this morning as well.  I inquired if her urine output is down and she states she thinks it may be- she hasn't noticed going as frequently.  Weight 1/21- 202 lbs Weight today- 191-192 lbs  I have advised the patient that she seems to be volume deplete- I will review with Laurann Montana, NP for further recommendations.  The patient is aware I will call back- she states it is ok to leave a message/ send a MyChart message.

## 2020-02-07 NOTE — Telephone Encounter (Signed)
Pt c/o BP issue: STAT if pt c/o blurred vision, one-sided weakness or slurred speech  1. What are your last 5 BP readings? Today - 1 pm 106/62 yeserday - 126/78  2. Are you having any other symptoms (ex. Dizziness, headache, blurred vision, passed out)? lightheadedness  3. What is your BP issue? Too low

## 2020-02-07 NOTE — Telephone Encounter (Signed)
I spoke with the patient. She is aware if Caitlin's recommendations as stated below and voices understanding.

## 2020-02-09 ENCOUNTER — Ambulatory Visit: Payer: Medicare Other

## 2020-02-11 ENCOUNTER — Inpatient Hospital Stay: Payer: Medicare Other

## 2020-02-11 ENCOUNTER — Other Ambulatory Visit: Payer: Self-pay

## 2020-02-11 VITALS — BP 101/80 | HR 85 | Temp 98.0°F | Resp 20

## 2020-02-11 DIAGNOSIS — D500 Iron deficiency anemia secondary to blood loss (chronic): Secondary | ICD-10-CM

## 2020-02-11 DIAGNOSIS — D508 Other iron deficiency anemias: Secondary | ICD-10-CM | POA: Diagnosis not present

## 2020-02-11 MED ORDER — SODIUM CHLORIDE 0.9 % IV SOLN
INTRAVENOUS | Status: DC
  Filled 2020-02-11: qty 250

## 2020-02-11 MED ORDER — IRON SUCROSE 20 MG/ML IV SOLN
200.0000 mg | Freq: Once | INTRAVENOUS | Status: AC
  Administered 2020-02-11: 200 mg via INTRAVENOUS
  Filled 2020-02-11: qty 10

## 2020-02-14 ENCOUNTER — Ambulatory Visit (INDEPENDENT_AMBULATORY_CARE_PROVIDER_SITE_OTHER): Payer: Medicare Other | Admitting: Family

## 2020-02-14 ENCOUNTER — Ambulatory Visit: Payer: Medicare Other | Admitting: Pulmonary Disease

## 2020-02-14 ENCOUNTER — Telehealth: Payer: Self-pay | Admitting: Family

## 2020-02-14 ENCOUNTER — Encounter: Payer: Self-pay | Admitting: Family

## 2020-02-14 ENCOUNTER — Other Ambulatory Visit: Payer: Self-pay | Admitting: Pulmonary Disease

## 2020-02-14 VITALS — Ht 66.5 in | Wt 191.0 lb

## 2020-02-14 DIAGNOSIS — M546 Pain in thoracic spine: Secondary | ICD-10-CM | POA: Diagnosis not present

## 2020-02-14 DIAGNOSIS — I4891 Unspecified atrial fibrillation: Secondary | ICD-10-CM | POA: Diagnosis not present

## 2020-02-14 DIAGNOSIS — F411 Generalized anxiety disorder: Secondary | ICD-10-CM

## 2020-02-14 MED ORDER — TRELEGY ELLIPTA 100-62.5-25 MCG/INH IN AEPB: 1.0000 | INHALATION_SPRAY | Freq: Every day | RESPIRATORY_TRACT | 0 refills | Status: DC

## 2020-02-14 MED ORDER — CYCLOBENZAPRINE HCL 5 MG PO TABS: 5.0000 mg | ORAL_TABLET | Freq: Three times a day (TID) | ORAL | 1 refills | Status: DC | PRN

## 2020-02-14 NOTE — Assessment & Plan Note (Signed)
Suspect likely muscle spasm.  Pain worsened with movement.  Trial of Flexeril, encouraged heat.  She will let me know if no improvement.  She will also stay vigilant in regards to rash (? Early shingles).

## 2020-02-14 NOTE — Telephone Encounter (Signed)
Pt states that she has a catch in her back and needs advice on what to do about it. We have no appts available today and pt did not want to be seen on another day.

## 2020-02-14 NOTE — Telephone Encounter (Signed)
Patient was able to get appointment today @ 10.

## 2020-02-14 NOTE — Progress Notes (Signed)
Virtual Visit via Video Note  I connected with@  on 02/14/20 at 10:00 AM EST by a video enabled telemedicine application and verified that I am speaking with the correct person using two identifiers.  Location patient: home Location provider:work  Persons participating in the virtual visit: patient, provider  I discussed the limitations of evaluation and management by telemedicine and the availability of in person appointments. The patient expressed understanding and agreed to proceed.   HPI: Acute visit CC: middle to right sided of upper back pain started yesterday, worsening.  Noticed a 'dull ache' when washing dishes for instance , however today if moves a certain way such to left or has to go from sitting to standing, 'it will catch' and have severe pain. Pain will 'ease off' when still. Has taken tramadol and gabapentin without relief.  No numbness in back, arms, legs. No weakness or falls. No dysuria, hematuria, vomiting, fever, nauseated, abdominal pain, rash. No h/o shingles.   Fatigue has improved. Feels well. Doing well on zoloft. Sleeping well.   Anemia- Dr Tasia Catchings  12/2019, She may have ongoing blood loss from GI tract in the context of chronic anticoagulation. GAD- follows with Dr Shea Evans HTN- recently started on 25mg  hctz PRN by cardiology.Will take 12.5mg  HCTZ. Takes the kcl when takes hctz.  Blood pressure at home is fluctuating, yesterday 91/58, 84/59, NOT taking the HCTZ; day prior which is closer to her normal 116/67,  132/88, HR 84.  will hold HCTZ if blood pressure is low 110.  No CP, dizziness.   Due for dexa/mm.   ROS: See pertinent positives and negatives per HPI.  Past Medical History:  Diagnosis Date  . A-fib ([place])   . Barretts esophagus   . Chest pain    a. 02/2014 Myoview: Ef 50%, no ischemia.  . Colon polyps   . COPD (chronic obstructive pulmonary disease) ([place])   . Depression with anxiety   . GERD (gastroesophageal reflux disease)   . Hx of benign  essential tremor   . Neuropathy   . Permanent atrial fibrillation [facility])    a. s/p ablation 01/2012 in [facility] by Dr. Boyd Kerbs;  b. On sotalol & Xarelto;  c. 02/2014 Echo: EF 50-55%, mild conc LVH, nl LA size/structure;  d. Recurrent afib 8/15 & 08/29/2014.  [name] Rectal fistula   . Rheumatoid arthritis ([place])    On methotrexate and orencia  . Urinary incontinence   . Vitamin D deficiency     Past Surgical History:  Procedure Laterality Date  . ABDOMINAL HYSTERECTOMY  1992  . APPENDECTOMY    . CARDIAC ELECTROPHYSIOLOGY STUDY AND ABLATION  2013  . CHOLECYSTECTOMY  1987  . COLONOSCOPY N/A 05/08/2015   Procedure: COLONOSCOPY;  Surgeon: Manya Silvas, MD;  Location: [facility] ENDOSCOPY;  Service: Endoscopy;  Laterality: N/A;  . COLONOSCOPY WITH PROPOFOL N/A 09/29/2019   Procedure: COLONOSCOPY WITH PROPOFOL;  Surgeon: Toledo, Benay Pike, MD;  Location: [facility] ENDOSCOPY;  Service: Gastroenterology;  Laterality: N/A;  . ESOPHAGOGASTRODUODENOSCOPY N/A 05/06/2015   Procedure: ESOPHAGOGASTRODUODENOSCOPY (EGD);  Surgeon: Lollie Sails, MD;  Location: [facility] ENDOSCOPY;  Service: Endoscopy;  Laterality: N/A;  . ESOPHAGOGASTRODUODENOSCOPY (EGD) WITH PROPOFOL N/A 09/29/2019   Procedure: ESOPHAGOGASTRODUODENOSCOPY (EGD) WITH PROPOFOL;  Surgeon: Toledo, Benay Pike, MD;  Location: [facility] ENDOSCOPY;  Service: Gastroenterology;  Laterality: N/A;  . LAPAROSCOPIC APPENDECTOMY N/A 02/19/2019   Procedure: APPENDECTOMY LAPAROSCOPIC converted to open appendectomy;  Surgeon: Herbert Pun, MD;  Location: [facility] ORS;  Service: General;  Laterality: N/A;  .  OOPHORECTOMY    . oophrectomy Bilateral 1992    Family History  Problem Relation Age of Onset  . Depression Mother   . Pancreatic cancer Mother   . Suicidality Mother   . Colon cancer Father   . Breast cancer Sister 66  . Diabetes Brother   . Breast cancer Cousin   . Neuropathy Neg Hx     SOCIAL HX: former smoker   Current Outpatient Medications:  .  albuterol  (PROVENTIL HFA;VENTOLIN HFA) 108 (90 Base) MCG/ACT inhaler, Inhale 1-2 puffs into the lungs every 4 (four) hours as needed for wheezing or shortness of breath., Disp: 1 Inhaler, Rfl: 10 .  busPIRone (BUSPAR) 10 MG tablet, Take 1 tablet (10 mg total) by mouth 3 (three) times daily., Disp: 270 tablet, Rfl: 0 .  clonazePAM (KLONOPIN) 0.5 MG tablet, Take 1/2 tablet daily if needed for anxiety. (Patient taking differently: 0.25 mg 2 (two) times daily as needed. Take 1/2 tablet daily if needed for anxiety.), Disp: 15 tablet, Rfl: 0 .  CREON 36000 units CPEP capsule, Takes 2 capsules three times daily., Disp: , Rfl:  .  ELIQUIS 5 MG TABS tablet, Take 1 tablet (5 mg total) by mouth 2 (two) times daily., Disp: 180 tablet, Rfl: 3 .  esomeprazole (NEXIUM) 20 MG capsule, TAKE 1 CAPSULE BY MOUTH EVERY DAY, Disp: 90 capsule, Rfl: 1 .  Fluticasone-Umeclidin-Vilant (TRELEGY ELLIPTA) 100-62.5-25 MCG/INH AEPB, Inhale 1 puff into the lungs daily., Disp: 60 each, Rfl: 0 .  gabapentin (NEURONTIN) 600 MG tablet, TAKE 1 TABLET BY MOUTH THREE TIMES A DAY, Disp: 90 tablet, Rfl: 1 .  HUMIRA PEN 40 MG/0.4ML PNKT, Inject 40 mg as directed. Every 2 weeks, Disp: , Rfl:  .  hydrochlorothiazide (HYDRODIURIL) 25 MG tablet, Take 1 tablet (25 mg total) by mouth daily., Disp: 30 tablet, Rfl: 11 .  hydroxychloroquine (PLAQUENIL) 200 MG tablet, Take 200 mg by mouth 2 (two) times daily. , Disp: , Rfl:  .  hydrOXYzine (ATARAX/VISTARIL) 50 MG tablet, Take 0.5-1 tablets (25-50 mg total) by mouth daily as needed for anxiety. Patient has supplies, Disp: 90 tablet, Rfl: 0 .  hyoscyamine (LEVSIN) 0.125 MG tablet, Take 0.125 mg by mouth as needed., Disp: , Rfl:  .  lidocaine (XYLOCAINE) 5 % ointment, Apply 1 application topically as needed., Disp: , Rfl:  .  lipase/protease/amylase (CREON) 36000 UNITS CPEP capsule, Take by mouth., Disp: , Rfl:  .  potassium chloride (KLOR-CON) 10 MEQ tablet, TAKE 1 TABLET (10 MEQ TOTAL) BY MOUTH DAILY AS NEEDED.,  Disp: 90 tablet, Rfl: 0 .  propranolol ER (INDERAL LA) 120 MG 24 hr capsule, Take 1 capsule (120 mg total) by mouth daily., Disp: 90 capsule, Rfl: 3 .  sertraline (ZOLOFT) 50 MG tablet, TAKE 1 TABLET BY MOUTH EVERY DAY, Disp: 90 tablet, Rfl: 0 .  traMADol (ULTRAM) 50 MG tablet, Take 1 tablet (50 mg total) by mouth every 12 (twelve) hours as needed for severe pain., Disp: 30 tablet, Rfl: 0 .  traZODone (DESYREL) 50 MG tablet, Take 1 tablet (50 mg total) by mouth at bedtime., Disp: 90 tablet, Rfl: 0 .  cyclobenzaprine (FLEXERIL) 5 MG tablet, Take 1 tablet (5 mg total) by mouth 3 (three) times daily as needed for muscle spasms., Disp: 30 tablet, Rfl: 1  EXAM:  VITALS per patient if applicable: BP Readings from Last 3 Encounters:  02/11/20 101/80  02/04/20 112/76  01/26/20 125/63     GENERAL: alert, oriented, appears well and in no acute  distress  HEENT: atraumatic, conjunttiva clear, no obvious abnormalities on inspection of external nose and ears  NECK: normal movements of the head and neck  LUNGS: on inspection no signs of respiratory distress, breathing rate appears normal, no obvious gross SOB, gasping or wheezing  CV: no obvious cyanosis  MS: moves all visible extremities without noticeable abnormality  PSYCH/NEURO: pleasant and cooperative, no obvious depression or anxiety, speech and thought processing grossly intact  ASSESSMENT AND PLAN:  Discussed the following assessment and plan:  Acute right-sided thoracic back pain - Plan: cyclobenzaprine (FLEXERIL) 5 MG tablet  Atrial fibrillation with RVR (HCC)  GAD (generalized anxiety disorder) Problem List Items Addressed This Visit      Cardiovascular and Mediastinum   Atrial fibrillation with RVR (HCC)    Blood pressure is fluctuant however normally not very low. She is asymptomatic and will keep a log.  She will continue propranolol and use 12.5mg  HCTZ prn if blood pressure elevated.   advised to continue to keep log and  let us know of any concerns.  Patient agreed with this plan She will continue following with Dr Rockey Situ.         Other   GAD (generalized anxiety disorder)    Doing well. She will continue to follow with Dr Shea Evans.       Thoracic back pain - Primary    Suspect likely muscle spasm.  Pain worsened with movement.  Trial of Flexeril, encouraged heat.  She will let me know if no improvement.  She will also stay vigilant in regards to rash (? Early shingles).       Relevant Medications   cyclobenzaprine (FLEXERIL) 5 MG tablet      -we discussed possible serious and likely etiologies, options for evaluation and workup, limitations of telemedicine visit vs in person visit, treatment, treatment risks and precautions. Pt prefers to treat via telemedicine empirically rather then risking or undertaking an in person visit at this moment. Patient agrees to seek prompt in person care if worsening, new symptoms arise, or if is not improving with treatment.   I discussed the assessment and treatment plan with the patient. The patient was provided an opportunity to ask questions and all were answered. The patient agreed with the plan and demonstrated an understanding of the instructions.   The patient was advised to call back or seek an in-person evaluation if the symptoms worsen or if the condition fails to improve as anticipated.   Mable Paris, FNP

## 2020-02-14 NOTE — Assessment & Plan Note (Addendum)
Blood pressure is fluctuant however normally not very low. She is asymptomatic and will keep a log.  She will continue propranolol and use 12.5mg  HCTZ prn if blood pressure elevated.   advised to continue to keep log and let us know of any concerns.  Patient agreed with this plan She will continue following with Dr Rockey Situ.

## 2020-02-14 NOTE — Patient Instructions (Signed)
Stay safe.

## 2020-02-14 NOTE — Assessment & Plan Note (Signed)
Doing well. She will continue to follow with Dr Shea Evans.

## 2020-02-15 NOTE — Telephone Encounter (Signed)
error 

## 2020-02-16 ENCOUNTER — Emergency Department: Payer: Medicare Other

## 2020-02-16 ENCOUNTER — Other Ambulatory Visit: Payer: Self-pay

## 2020-02-16 ENCOUNTER — Encounter: Payer: Self-pay | Admitting: Emergency Medicine

## 2020-02-16 ENCOUNTER — Telehealth: Payer: Self-pay | Admitting: Family

## 2020-02-16 ENCOUNTER — Inpatient Hospital Stay
Admission: EM | Admit: 2020-02-16 | Discharge: 2020-02-23 | DRG: 871 | Disposition: A | Discharge status: Skilled Nursing Facility | Payer: Medicare Other | Attending: Internal Medicine | Admitting: Internal Medicine

## 2020-02-16 DIAGNOSIS — J9601 Acute respiratory failure with hypoxia: Secondary | ICD-10-CM

## 2020-02-16 DIAGNOSIS — J9811 Atelectasis: Secondary | ICD-10-CM | POA: Diagnosis present

## 2020-02-16 DIAGNOSIS — I130 Hypertensive heart and chronic kidney disease with heart failure and stage 1 through stage 4 chronic kidney disease, or unspecified chronic kidney disease: Secondary | ICD-10-CM | POA: Diagnosis present

## 2020-02-16 DIAGNOSIS — F329 Major depressive disorder, single episode, unspecified: Secondary | ICD-10-CM | POA: Diagnosis not present

## 2020-02-16 DIAGNOSIS — R5381 Other malaise: Secondary | ICD-10-CM | POA: Diagnosis not present

## 2020-02-16 DIAGNOSIS — K219 Gastro-esophageal reflux disease without esophagitis: Secondary | ICD-10-CM | POA: Diagnosis present

## 2020-02-16 DIAGNOSIS — I358 Other nonrheumatic aortic valve disorders: Secondary | ICD-10-CM | POA: Diagnosis not present

## 2020-02-16 DIAGNOSIS — M47816 Spondylosis without myelopathy or radiculopathy, lumbar region: Secondary | ICD-10-CM | POA: Diagnosis not present

## 2020-02-16 DIAGNOSIS — K861 Other chronic pancreatitis: Secondary | ICD-10-CM | POA: Diagnosis present

## 2020-02-16 DIAGNOSIS — N182 Chronic kidney disease, stage 2 (mild): Secondary | ICD-10-CM | POA: Diagnosis present

## 2020-02-16 DIAGNOSIS — R6520 Severe sepsis without septic shock: Secondary | ICD-10-CM | POA: Diagnosis present

## 2020-02-16 DIAGNOSIS — M6281 Muscle weakness (generalized): Secondary | ICD-10-CM | POA: Diagnosis not present

## 2020-02-16 DIAGNOSIS — K529 Noninfective gastroenteritis and colitis, unspecified: Secondary | ICD-10-CM | POA: Diagnosis not present

## 2020-02-16 DIAGNOSIS — R0602 Shortness of breath: Secondary | ICD-10-CM | POA: Diagnosis not present

## 2020-02-16 DIAGNOSIS — G250 Essential tremor: Secondary | ICD-10-CM | POA: Diagnosis present

## 2020-02-16 DIAGNOSIS — Z95828 Presence of other vascular implants and grafts: Secondary | ICD-10-CM

## 2020-02-16 DIAGNOSIS — R279 Unspecified lack of coordination: Secondary | ICD-10-CM | POA: Diagnosis not present

## 2020-02-16 DIAGNOSIS — Z7901 Long term (current) use of anticoagulants: Secondary | ICD-10-CM

## 2020-02-16 DIAGNOSIS — I10 Essential (primary) hypertension: Secondary | ICD-10-CM | POA: Diagnosis not present

## 2020-02-16 DIAGNOSIS — Z91040 Latex allergy status: Secondary | ICD-10-CM

## 2020-02-16 DIAGNOSIS — A4101 Sepsis due to Methicillin susceptible Staphylococcus aureus: Principal | ICD-10-CM | POA: Diagnosis present

## 2020-02-16 DIAGNOSIS — R488 Other symbolic dysfunctions: Secondary | ICD-10-CM | POA: Diagnosis not present

## 2020-02-16 DIAGNOSIS — I5033 Acute on chronic diastolic (congestive) heart failure: Secondary | ICD-10-CM | POA: Diagnosis not present

## 2020-02-16 DIAGNOSIS — R7881 Bacteremia: Secondary | ICD-10-CM | POA: Diagnosis not present

## 2020-02-16 DIAGNOSIS — G9340 Encephalopathy, unspecified: Secondary | ICD-10-CM | POA: Diagnosis not present

## 2020-02-16 DIAGNOSIS — K8681 Exocrine pancreatic insufficiency: Secondary | ICD-10-CM | POA: Diagnosis present

## 2020-02-16 DIAGNOSIS — Z452 Encounter for adjustment and management of vascular access device: Secondary | ICD-10-CM | POA: Diagnosis not present

## 2020-02-16 DIAGNOSIS — M0559 Rheumatoid polyneuropathy with rheumatoid arthritis of multiple sites: Secondary | ICD-10-CM | POA: Diagnosis not present

## 2020-02-16 DIAGNOSIS — R498 Other voice and resonance disorders: Secondary | ICD-10-CM | POA: Diagnosis not present

## 2020-02-16 DIAGNOSIS — J90 Pleural effusion, not elsewhere classified: Secondary | ICD-10-CM | POA: Diagnosis not present

## 2020-02-16 DIAGNOSIS — A419 Sepsis, unspecified organism: Secondary | ICD-10-CM | POA: Diagnosis not present

## 2020-02-16 DIAGNOSIS — J441 Chronic obstructive pulmonary disease with (acute) exacerbation: Secondary | ICD-10-CM | POA: Diagnosis not present

## 2020-02-16 DIAGNOSIS — N12 Tubulo-interstitial nephritis, not specified as acute or chronic: Secondary | ICD-10-CM | POA: Diagnosis not present

## 2020-02-16 DIAGNOSIS — M546 Pain in thoracic spine: Secondary | ICD-10-CM | POA: Diagnosis present

## 2020-02-16 DIAGNOSIS — Z90710 Acquired absence of both cervix and uterus: Secondary | ICD-10-CM

## 2020-02-16 DIAGNOSIS — N179 Acute kidney failure, unspecified: Secondary | ICD-10-CM | POA: Diagnosis present

## 2020-02-16 DIAGNOSIS — R112 Nausea with vomiting, unspecified: Secondary | ICD-10-CM | POA: Diagnosis not present

## 2020-02-16 DIAGNOSIS — D500 Iron deficiency anemia secondary to blood loss (chronic): Secondary | ICD-10-CM | POA: Diagnosis present

## 2020-02-16 DIAGNOSIS — D84821 Immunodeficiency due to drugs: Secondary | ICD-10-CM | POA: Diagnosis present

## 2020-02-16 DIAGNOSIS — Z6830 Body mass index (BMI) 30.0-30.9, adult: Secondary | ICD-10-CM

## 2020-02-16 DIAGNOSIS — M5134 Other intervertebral disc degeneration, thoracic region: Secondary | ICD-10-CM | POA: Diagnosis present

## 2020-02-16 DIAGNOSIS — J449 Chronic obstructive pulmonary disease, unspecified: Secondary | ICD-10-CM | POA: Diagnosis present

## 2020-02-16 DIAGNOSIS — Z20822 Contact with and (suspected) exposure to covid-19: Secondary | ICD-10-CM | POA: Diagnosis not present

## 2020-02-16 DIAGNOSIS — Z800 Family history of malignant neoplasm of digestive organs: Secondary | ICD-10-CM

## 2020-02-16 DIAGNOSIS — R251 Tremor, unspecified: Secondary | ICD-10-CM | POA: Diagnosis present

## 2020-02-16 DIAGNOSIS — G928 Other toxic encephalopathy: Secondary | ICD-10-CM | POA: Insufficient documentation

## 2020-02-16 DIAGNOSIS — F411 Generalized anxiety disorder: Secondary | ICD-10-CM | POA: Diagnosis not present

## 2020-02-16 DIAGNOSIS — R1110 Vomiting, unspecified: Secondary | ICD-10-CM | POA: Diagnosis not present

## 2020-02-16 DIAGNOSIS — Z79899 Other long term (current) drug therapy: Secondary | ICD-10-CM

## 2020-02-16 DIAGNOSIS — R52 Pain, unspecified: Secondary | ICD-10-CM | POA: Diagnosis not present

## 2020-02-16 DIAGNOSIS — M069 Rheumatoid arthritis, unspecified: Secondary | ICD-10-CM | POA: Diagnosis present

## 2020-02-16 DIAGNOSIS — E66812 Obesity, class 2: Secondary | ICD-10-CM | POA: Diagnosis present

## 2020-02-16 DIAGNOSIS — R509 Fever, unspecified: Secondary | ICD-10-CM | POA: Diagnosis not present

## 2020-02-16 DIAGNOSIS — R05 Cough: Secondary | ICD-10-CM | POA: Diagnosis not present

## 2020-02-16 DIAGNOSIS — J9621 Acute and chronic respiratory failure with hypoxia: Secondary | ICD-10-CM | POA: Diagnosis not present

## 2020-02-16 DIAGNOSIS — R0689 Other abnormalities of breathing: Secondary | ICD-10-CM | POA: Diagnosis not present

## 2020-02-16 DIAGNOSIS — K2270 Barrett's esophagus without dysplasia: Secondary | ICD-10-CM | POA: Diagnosis not present

## 2020-02-16 DIAGNOSIS — Z7951 Long term (current) use of inhaled steroids: Secondary | ICD-10-CM | POA: Diagnosis not present

## 2020-02-16 DIAGNOSIS — I739 Peripheral vascular disease, unspecified: Secondary | ICD-10-CM | POA: Diagnosis not present

## 2020-02-16 DIAGNOSIS — G92 Toxic encephalopathy: Secondary | ICD-10-CM

## 2020-02-16 DIAGNOSIS — E669 Obesity, unspecified: Secondary | ICD-10-CM | POA: Diagnosis present

## 2020-02-16 DIAGNOSIS — I4821 Permanent atrial fibrillation: Secondary | ICD-10-CM | POA: Diagnosis not present

## 2020-02-16 DIAGNOSIS — Q453 Other congenital malformations of pancreas and pancreatic duct: Secondary | ICD-10-CM

## 2020-02-16 DIAGNOSIS — Z8719 Personal history of other diseases of the digestive system: Secondary | ICD-10-CM

## 2020-02-16 DIAGNOSIS — B9561 Methicillin susceptible Staphylococcus aureus infection as the cause of diseases classified elsewhere: Secondary | ICD-10-CM | POA: Diagnosis not present

## 2020-02-16 DIAGNOSIS — N10 Acute tubulo-interstitial nephritis: Secondary | ICD-10-CM | POA: Diagnosis present

## 2020-02-16 DIAGNOSIS — N390 Urinary tract infection, site not specified: Secondary | ICD-10-CM | POA: Diagnosis not present

## 2020-02-16 DIAGNOSIS — Z833 Family history of diabetes mellitus: Secondary | ICD-10-CM

## 2020-02-16 DIAGNOSIS — K2100 Gastro-esophageal reflux disease with esophagitis, without bleeding: Secondary | ICD-10-CM | POA: Diagnosis not present

## 2020-02-16 DIAGNOSIS — M5124 Other intervertebral disc displacement, thoracic region: Secondary | ICD-10-CM | POA: Diagnosis not present

## 2020-02-16 DIAGNOSIS — D696 Thrombocytopenia, unspecified: Secondary | ICD-10-CM

## 2020-02-16 DIAGNOSIS — I4891 Unspecified atrial fibrillation: Secondary | ICD-10-CM | POA: Diagnosis present

## 2020-02-16 DIAGNOSIS — G8929 Other chronic pain: Secondary | ICD-10-CM | POA: Diagnosis present

## 2020-02-16 DIAGNOSIS — G629 Polyneuropathy, unspecified: Secondary | ICD-10-CM | POA: Diagnosis present

## 2020-02-16 DIAGNOSIS — Z803 Family history of malignant neoplasm of breast: Secondary | ICD-10-CM

## 2020-02-16 DIAGNOSIS — F331 Major depressive disorder, recurrent, moderate: Secondary | ICD-10-CM | POA: Diagnosis present

## 2020-02-16 DIAGNOSIS — E871 Hypo-osmolality and hyponatremia: Secondary | ICD-10-CM | POA: Diagnosis not present

## 2020-02-16 DIAGNOSIS — K8689 Other specified diseases of pancreas: Secondary | ICD-10-CM | POA: Diagnosis not present

## 2020-02-16 DIAGNOSIS — D649 Anemia, unspecified: Secondary | ICD-10-CM | POA: Diagnosis not present

## 2020-02-16 DIAGNOSIS — M62838 Other muscle spasm: Secondary | ICD-10-CM | POA: Diagnosis not present

## 2020-02-16 DIAGNOSIS — M5126 Other intervertebral disc displacement, lumbar region: Secondary | ICD-10-CM | POA: Diagnosis not present

## 2020-02-16 DIAGNOSIS — Z743 Need for continuous supervision: Secondary | ICD-10-CM | POA: Diagnosis not present

## 2020-02-16 DIAGNOSIS — A4189 Other specified sepsis: Secondary | ICD-10-CM | POA: Diagnosis not present

## 2020-02-16 DIAGNOSIS — Z87891 Personal history of nicotine dependence: Secondary | ICD-10-CM

## 2020-02-16 DIAGNOSIS — R404 Transient alteration of awareness: Secondary | ICD-10-CM | POA: Diagnosis not present

## 2020-02-16 DIAGNOSIS — I517 Cardiomegaly: Secondary | ICD-10-CM | POA: Diagnosis not present

## 2020-02-16 DIAGNOSIS — I5032 Chronic diastolic (congestive) heart failure: Secondary | ICD-10-CM | POA: Diagnosis not present

## 2020-02-16 DIAGNOSIS — G9341 Metabolic encephalopathy: Secondary | ICD-10-CM | POA: Diagnosis not present

## 2020-02-16 DIAGNOSIS — Z9049 Acquired absence of other specified parts of digestive tract: Secondary | ICD-10-CM

## 2020-02-16 LAB — CBC WITH DIFFERENTIAL/PLATELET
Abs Immature Granulocytes: 0.04 10*3/uL (ref 0.00–0.07)
Basophils Absolute: 0 10*3/uL (ref 0.0–0.1)
Basophils Relative: 0 %
Eosinophils Absolute: 0 10*3/uL (ref 0.0–0.5)
Eosinophils Relative: 0 %
HCT: 35.3 % — ABNORMAL LOW (ref 36.0–46.0)
Hemoglobin: 10.6 g/dL — ABNORMAL LOW (ref 12.0–15.0)
Immature Granulocytes: 1 %
Lymphocytes Relative: 5 %
Lymphs Abs: 0.4 10*3/uL — ABNORMAL LOW (ref 0.7–4.0)
MCH: 24 pg — ABNORMAL LOW (ref 26.0–34.0)
MCHC: 30 g/dL (ref 30.0–36.0)
MCV: 79.9 fL — ABNORMAL LOW (ref 80.0–100.0)
Monocytes Absolute: 0.6 10*3/uL (ref 0.1–1.0)
Monocytes Relative: 7 %
Neutro Abs: 6.9 10*3/uL (ref 1.7–7.7)
Neutrophils Relative %: 87 %
Platelets: 138 10*3/uL — ABNORMAL LOW (ref 150–400)
RBC: 4.42 MIL/uL (ref 3.87–5.11)
RDW: 18.1 % — ABNORMAL HIGH (ref 11.5–15.5)
WBC: 8 10*3/uL (ref 4.0–10.5)
nRBC: 0 % (ref 0.0–0.2)

## 2020-02-16 LAB — URINALYSIS, COMPLETE (UACMP) WITH MICROSCOPIC
Bacteria, UA: NONE SEEN
Bilirubin Urine: NEGATIVE
Glucose, UA: NEGATIVE mg/dL
Ketones, ur: NEGATIVE mg/dL
Nitrite: NEGATIVE
Protein, ur: 30 mg/dL — AB
Specific Gravity, Urine: 1.035 — ABNORMAL HIGH (ref 1.005–1.030)
pH: 5 (ref 5.0–8.0)

## 2020-02-16 LAB — COMPREHENSIVE METABOLIC PANEL
ALT: 9 U/L (ref 0–44)
AST: 21 U/L (ref 15–41)
Albumin: 3.3 g/dL — ABNORMAL LOW (ref 3.5–5.0)
Alkaline Phosphatase: 82 U/L (ref 38–126)
Anion gap: 12 (ref 5–15)
BUN: 30 mg/dL — ABNORMAL HIGH (ref 8–23)
CO2: 20 mmol/L — ABNORMAL LOW (ref 22–32)
Calcium: 9.1 mg/dL (ref 8.9–10.3)
Chloride: 98 mmol/L (ref 98–111)
Creatinine, Ser: 1.13 mg/dL — ABNORMAL HIGH (ref 0.44–1.00)
GFR calc Af Amer: 57 mL/min — ABNORMAL LOW (ref >60)
GFR calc non Af Amer: 49 mL/min — ABNORMAL LOW (ref >60)
Glucose, Bld: 114 mg/dL — ABNORMAL HIGH (ref 70–99)
Potassium: 4 mmol/L (ref 3.5–5.1)
Sodium: 130 mmol/L — ABNORMAL LOW (ref 135–145)
Total Bilirubin: 1.3 mg/dL — ABNORMAL HIGH (ref 0.3–1.2)
Total Protein: 7.3 g/dL (ref 6.5–8.1)

## 2020-02-16 LAB — LACTIC ACID, PLASMA: Lactic Acid, Venous: 1.1 mmol/L (ref 0.5–1.9)

## 2020-02-16 LAB — RESPIRATORY PANEL BY RT PCR (FLU A&B, COVID)
Influenza A by PCR: NEGATIVE
Influenza B by PCR: NEGATIVE
SARS Coronavirus 2 by RT PCR: NEGATIVE

## 2020-02-16 LAB — PROTIME-INR
INR: 1.9 — ABNORMAL HIGH (ref 0.8–1.2)
Prothrombin Time: 22.1 seconds — ABNORMAL HIGH (ref 11.4–15.2)

## 2020-02-16 LAB — POC SARS CORONAVIRUS 2 AG: SARS Coronavirus 2 Ag: NEGATIVE

## 2020-02-16 MED ORDER — PANCRELIPASE (LIP-PROT-AMYL) 36000-114000 UNITS PO CPEP
36000.0000 [IU] | ORAL_CAPSULE | Freq: Three times a day (TID) | ORAL | Status: DC
  Administered 2020-02-16 – 2020-02-23 (×12): 36000 [IU] via ORAL
  Filled 2020-02-16: qty 1
  Filled 2020-02-16: qty 3
  Filled 2020-02-16: qty 1
  Filled 2020-02-16: qty 3
  Filled 2020-02-16 (×5): qty 1
  Filled 2020-02-16: qty 3
  Filled 2020-02-16 (×3): qty 1
  Filled 2020-02-16: qty 3
  Filled 2020-02-16 (×7): qty 1
  Filled 2020-02-16: qty 3
  Filled 2020-02-16: qty 1

## 2020-02-16 MED ORDER — HYDROXYCHLOROQUINE SULFATE 200 MG PO TABS
200.0000 mg | ORAL_TABLET | Freq: Two times a day (BID) | ORAL | Status: DC
  Administered 2020-02-16 – 2020-02-20 (×8): 200 mg via ORAL
  Filled 2020-02-16 (×10): qty 1

## 2020-02-16 MED ORDER — PANTOPRAZOLE SODIUM 40 MG PO TBEC
40.0000 mg | DELAYED_RELEASE_TABLET | Freq: Every day | ORAL | Status: DC
  Administered 2020-02-16 – 2020-02-22 (×7): 40 mg via ORAL
  Filled 2020-02-16 (×7): qty 1

## 2020-02-16 MED ORDER — IOHEXOL 300 MG/ML  SOLN
100.0000 mL | Freq: Once | INTRAMUSCULAR | Status: AC | PRN
  Administered 2020-02-16: 100 mL via INTRAVENOUS

## 2020-02-16 MED ORDER — ACETAMINOPHEN 650 MG RE SUPP: 650.0000 mg | Freq: Four times a day (QID) | RECTAL | Status: DC | PRN

## 2020-02-16 MED ORDER — HYOSCYAMINE SULFATE 0.125 MG PO TABS
0.1250 mg | ORAL_TABLET | ORAL | Status: DC | PRN
  Filled 2020-02-16: qty 1

## 2020-02-16 MED ORDER — SERTRALINE HCL 50 MG PO TABS
50.0000 mg | ORAL_TABLET | Freq: Every day | ORAL | Status: DC
  Administered 2020-02-16 – 2020-02-23 (×8): 50 mg via ORAL
  Filled 2020-02-16 (×9): qty 1

## 2020-02-16 MED ORDER — ACETAMINOPHEN 325 MG PO TABS
650.0000 mg | ORAL_TABLET | Freq: Four times a day (QID) | ORAL | Status: DC | PRN
  Administered 2020-02-17 – 2020-02-21 (×3): 650 mg via ORAL
  Filled 2020-02-16 (×3): qty 2

## 2020-02-16 MED ORDER — LACTATED RINGERS IV BOLUS (SEPSIS)
1000.0000 mL | Freq: Once | INTRAVENOUS | Status: AC
  Administered 2020-02-16: 11:00:00 1000 mL via INTRAVENOUS

## 2020-02-16 MED ORDER — APIXABAN 5 MG PO TABS
5.0000 mg | ORAL_TABLET | Freq: Two times a day (BID) | ORAL | Status: DC
  Administered 2020-02-16 – 2020-02-23 (×14): 5 mg via ORAL
  Filled 2020-02-16 (×14): qty 1

## 2020-02-16 MED ORDER — HYDROXYZINE HCL 25 MG PO TABS
50.0000 mg | ORAL_TABLET | Freq: Every day | ORAL | Status: DC
  Administered 2020-02-16 – 2020-02-18 (×3): 50 mg via ORAL
  Filled 2020-02-16 (×3): qty 2

## 2020-02-16 MED ORDER — ACETAMINOPHEN 325 MG PO TABS
650.0000 mg | ORAL_TABLET | Freq: Once | ORAL | Status: AC
  Administered 2020-02-16: 13:00:00 650 mg via ORAL
  Filled 2020-02-16: qty 2

## 2020-02-16 MED ORDER — BUSPIRONE HCL 10 MG PO TABS
10.0000 mg | ORAL_TABLET | Freq: Three times a day (TID) | ORAL | Status: DC
  Administered 2020-02-16 – 2020-02-23 (×21): 10 mg via ORAL
  Filled 2020-02-16 (×5): qty 1
  Filled 2020-02-16: qty 2
  Filled 2020-02-16 (×9): qty 1
  Filled 2020-02-16: qty 2
  Filled 2020-02-16 (×5): qty 1
  Filled 2020-02-16: qty 2
  Filled 2020-02-16 (×2): qty 1

## 2020-02-16 MED ORDER — PROMETHAZINE HCL 25 MG/ML IJ SOLN
12.5000 mg | Freq: Once | INTRAMUSCULAR | Status: AC
  Administered 2020-02-16: 12.5 mg via INTRAVENOUS
  Filled 2020-02-16: qty 1

## 2020-02-16 MED ORDER — PROPRANOLOL HCL ER 120 MG PO CP24
120.0000 mg | ORAL_CAPSULE | Freq: Every day | ORAL | Status: DC
  Administered 2020-02-16 – 2020-02-22 (×7): 120 mg via ORAL
  Filled 2020-02-16 (×7): qty 1

## 2020-02-16 MED ORDER — ONDANSETRON HCL 4 MG/2ML IJ SOLN
4.0000 mg | Freq: Four times a day (QID) | INTRAMUSCULAR | Status: DC | PRN
  Administered 2020-02-16: 19:00:00 4 mg via INTRAVENOUS
  Filled 2020-02-16: qty 2

## 2020-02-16 MED ORDER — VANCOMYCIN HCL 1250 MG/250ML IV SOLN: 1250.0000 mg | INTRAVENOUS | Status: DC

## 2020-02-16 MED ORDER — METRONIDAZOLE IN NACL 5-0.79 MG/ML-% IV SOLN
500.0000 mg | Freq: Once | INTRAVENOUS | Status: AC
  Administered 2020-02-16: 11:00:00 500 mg via INTRAVENOUS
  Filled 2020-02-16: qty 100

## 2020-02-16 MED ORDER — GABAPENTIN 600 MG PO TABS
600.0000 mg | ORAL_TABLET | Freq: Three times a day (TID) | ORAL | Status: DC
  Administered 2020-02-16 – 2020-02-23 (×21): 600 mg via ORAL
  Filled 2020-02-16 (×22): qty 1

## 2020-02-16 MED ORDER — VANCOMYCIN HCL IN DEXTROSE 1-5 GM/200ML-% IV SOLN
1000.0000 mg | Freq: Once | INTRAVENOUS | Status: AC
  Administered 2020-02-16: 1000 mg via INTRAVENOUS
  Filled 2020-02-16: qty 200

## 2020-02-16 MED ORDER — ONDANSETRON HCL 4 MG PO TABS: 4.0000 mg | ORAL_TABLET | Freq: Four times a day (QID) | ORAL | Status: DC | PRN

## 2020-02-16 MED ORDER — FLUTICASONE FUROATE-VILANTEROL 100-25 MCG/INH IN AEPB
1.0000 | INHALATION_SPRAY | Freq: Every day | RESPIRATORY_TRACT | Status: DC
  Administered 2020-02-16 – 2020-02-23 (×8): 1 via RESPIRATORY_TRACT
  Filled 2020-02-16 (×3): qty 28

## 2020-02-16 MED ORDER — VANCOMYCIN HCL IN DEXTROSE 1-5 GM/200ML-% IV SOLN
1000.0000 mg | Freq: Once | INTRAVENOUS | Status: AC
  Administered 2020-02-16: 16:00:00 1000 mg via INTRAVENOUS
  Filled 2020-02-16: qty 200

## 2020-02-16 MED ORDER — SODIUM CHLORIDE 0.9 % IV SOLN
2.0000 g | Freq: Once | INTRAVENOUS | Status: AC
  Administered 2020-02-16: 11:00:00 2 g via INTRAVENOUS
  Filled 2020-02-16: qty 2

## 2020-02-16 MED ORDER — MORPHINE SULFATE (PF) 2 MG/ML IV SOLN
1.0000 mg | INTRAVENOUS | Status: DC | PRN
  Administered 2020-02-16: 21:00:00 1 mg via INTRAVENOUS
  Filled 2020-02-16: qty 1

## 2020-02-16 MED ORDER — FLUTICASONE-UMECLIDIN-VILANT 100-62.5-25 MCG/INH IN AEPB: 1.0000 | INHALATION_SPRAY | Freq: Every day | RESPIRATORY_TRACT | Status: DC

## 2020-02-16 MED ORDER — LACTATED RINGERS IV BOLUS (SEPSIS)
1000.0000 mL | Freq: Once | INTRAVENOUS | Status: AC
  Administered 2020-02-16: 13:00:00 1000 mL via INTRAVENOUS

## 2020-02-16 MED ORDER — SODIUM CHLORIDE 0.9 % IV SOLN: INTRAVENOUS | Status: AC

## 2020-02-16 MED ORDER — ENOXAPARIN SODIUM 40 MG/0.4ML ~~LOC~~ SOLN: 40.0000 mg | SUBCUTANEOUS | Status: DC

## 2020-02-16 MED ORDER — UMECLIDINIUM BROMIDE 62.5 MCG/INH IN AEPB
1.0000 | INHALATION_SPRAY | Freq: Every day | RESPIRATORY_TRACT | Status: DC
  Administered 2020-02-16 – 2020-02-23 (×8): 1 via RESPIRATORY_TRACT
  Filled 2020-02-16 (×2): qty 7

## 2020-02-16 MED ORDER — TRAZODONE HCL 50 MG PO TABS
50.0000 mg | ORAL_TABLET | Freq: Every day | ORAL | Status: DC
  Administered 2020-02-16 – 2020-02-18 (×3): 50 mg via ORAL
  Filled 2020-02-16 (×3): qty 1

## 2020-02-16 MED ORDER — SODIUM CHLORIDE 0.9 % IV SOLN
2.0000 g | Freq: Two times a day (BID) | INTRAVENOUS | Status: DC
  Administered 2020-02-16: 22:00:00 2 g via INTRAVENOUS
  Filled 2020-02-16: qty 2

## 2020-02-16 NOTE — ED Provider Notes (Signed)
[facility] Emergency Department Provider Note  ____________________________________________   First MD Initiated Contact with Patient 02/16/20 1044     (approximate)  I have reviewed the triage vital signs and the nursing notes.   HISTORY  Chief Complaint Altered Mental Status and Fever    HPI DASH Brandy Armstrong is a 74 y.o. female with past medical history as below including A. fib, COPD, rheumatoid arthritis on methotrexate and [place] [facility], here with confusion.  History somewhat limited due to confusion.  Patient normally is awake, alert, and independent.  She lives alone.  Per family's report, she began to get somewhat more confused yesterday.  This morning, patient essentially could not tell family what was going on or where she was.  This is all new for her.  She also had a fever up to 101.  She complains of some mild abdominal pain but otherwise history is limited due to confusion.  She denies any chest pain.  No known cough or exposures to coronavirus.   Level 5 caveat invoked as remainder of history, ROS, and physical exam limited due to patient's confusion.        Past Medical History:  Diagnosis Date  . A-fib ([place])   . Barretts esophagus   . Chest pain    a. 02/2014 Myoview: Ef 50%, no ischemia.  . Colon polyps   . COPD (chronic obstructive pulmonary disease) ([place])   . Depression with anxiety   . GERD (gastroesophageal reflux disease)   . Hx of benign essential tremor   . Neuropathy   . Permanent atrial fibrillation [facility])    a. s/p ablation 01/2012 in [facility] by Dr. Boyd Kerbs;  b. On sotalol & Xarelto;  c. 02/2014 Echo: EF 50-55%, mild conc LVH, nl LA size/structure;  d. Recurrent afib 8/15 & 08/29/2014.  [name] Rectal fistula   . Rheumatoid arthritis ([place])    On methotrexate and orencia  . Urinary incontinence   . Vitamin D deficiency     Patient Active Problem List   Diagnosis Date Noted  . Sepsis ([place]) 02/16/2020  . Acute lower UTI 02/16/2020   . Toxic metabolic encephalopathy   . Pancreatic insufficiency 01/17/2020  . Pancreatic divisum 01/17/2020  . 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 MDD (major depressive disorder), recurrent episode, moderate ([place]) 08/18/2019  . GAD (generalized anxiety disorder) 08/18/2019  . Panic attacks 08/18/2019  . Cognitive disorder 08/18/2019  . Chest pain 07/09/2019  . Greater trochanteric bursitis of left hip 05/21/2019  . Anesthesia of skin 05/10/2019  . Loose stools 04/07/2019  . Anxiety 04/02/2019  . Appendicitis with perforation 03/09/2019  . Acute appendicitis   . Acute appendicitis with localized peritonitis 02/19/2019  . Chronic diastolic CHF (congestive heart failure) ([place]) 02/10/2019  . Atherosclerosis of aorta ([place]) 02/05/2019  . Bronchitis 12/28/2018  . AAA (abdominal aortic aneurysm) without rupture ([place]) 12/17/2018  . COPD exacerbation ([place]) 12/09/2018  . Iron deficiency anemia due to chronic blood loss 11/03/2018  . Obesity, Class II, BMI 35-39.9 09/23/2018  . Primary osteoarthritis of right knee 09/23/2018  . GERD (gastroesophageal reflux disease) 08/24/2018  . Hives 06/17/2018  . B12 deficiency 06/17/2018  . Anxiety and depression 03/23/2018  . Purpura ([place]) 08/06/2017  . Radicular pain in left arm 09/30/2016  . Cellulitis 09/26/2016  . Abscess 09/24/2016  . PAD (peripheral artery disease) ([place]) 08/13/2016  . Anemia 08/13/2016  . Urinary urgency 08/06/2016  . Thoracic back pain 05/20/2016  . Neuropathy 10/05/2015  .  Tremors of nervous system 06/18/2015  . GI bleed due to NSAIDs 05/04/2015  . Dysuria 03/07/2015  . Atrial fibrillation with RVR ([place]) 03/11/2014  . COPD (chronic obstructive pulmonary disease) ([place]) 03/11/2014  . Flexor hallucis longus tendinitis 11/09/2013  . Vertigo 09/16/2013  . Chronic steroid use 09/11/2013  . Osteoarthritis 09/11/2013  . Shortness of breath 08/18/2013  . Generalized weakness 08/18/2013  . Barrett's  esophagus 08/18/2013  . Chronic diarrhea 08/18/2013  . Numbness and tingling 08/18/2013  . Screening for breast cancer 08/18/2013  . Rheumatoid arthritis ([place]) 05/31/2013  . Seborrheic keratosis 12/14/2012  . Benign neoplasm of colon 11/23/2012  . Family history of malignant neoplasm of gastrointestinal tract 11/23/2012  . Prediabetes 12/16/2011    Past Surgical History:  Procedure Laterality Date  . ABDOMINAL HYSTERECTOMY  1992  . APPENDECTOMY    . CARDIAC ELECTROPHYSIOLOGY STUDY AND ABLATION  2013  . CHOLECYSTECTOMY  1987  . COLONOSCOPY N/A 05/08/2015   Procedure: COLONOSCOPY;  Surgeon: Manya Silvas, MD;  Location: [facility] ENDOSCOPY;  Service: Endoscopy;  Laterality: N/A;  . COLONOSCOPY WITH PROPOFOL N/A 09/29/2019   Procedure: COLONOSCOPY WITH PROPOFOL;  Surgeon: Toledo, Benay Pike, MD;  Location: [facility] ENDOSCOPY;  Service: Gastroenterology;  Laterality: N/A;  . ESOPHAGOGASTRODUODENOSCOPY N/A 05/06/2015   Procedure: ESOPHAGOGASTRODUODENOSCOPY (EGD);  Surgeon: Lollie Sails, MD;  Location: [facility] ENDOSCOPY;  Service: Endoscopy;  Laterality: N/A;  . ESOPHAGOGASTRODUODENOSCOPY (EGD) WITH PROPOFOL N/A 09/29/2019   Procedure: ESOPHAGOGASTRODUODENOSCOPY (EGD) WITH PROPOFOL;  Surgeon: Toledo, Benay Pike, MD;  Location: [facility] ENDOSCOPY;  Service: Gastroenterology;  Laterality: N/A;  . LAPAROSCOPIC APPENDECTOMY N/A 02/19/2019   Procedure: APPENDECTOMY LAPAROSCOPIC converted to open appendectomy;  Surgeon: Herbert Pun, MD;  Location: [facility] ORS;  Service: General;  Laterality: N/A;  . OOPHORECTOMY    . oophrectomy Bilateral 1992    Prior to Admission medications   Medication Sig Start Date End Date Taking? Authorizing Provider  busPIRone (BUSPAR) 10 MG tablet Take 1 tablet (10 mg total) by mouth 3 (three) times daily. 01/19/20  Yes Ursula Alert, MD  CREON 36000 units CPEP capsule Takes 2 capsules three times daily. 10/21/19  Yes [provider]  cyclobenzaprine (FLEXERIL) 5 MG  tablet Take 1 tablet (5 mg total) by mouth 3 (three) times daily as needed for muscle spasms. 02/14/20  Yes Arnett, Yvetta Coder, FNP  ELIQUIS 5 MG TABS tablet Take 1 tablet (5 mg total) by mouth 2 (two) times daily. 06/24/19  Yes Minna Merritts, MD  esomeprazole (NEXIUM) 20 MG capsule TAKE 1 CAPSULE BY MOUTH EVERY DAY 09/29/19  Yes Arnett, Yvetta Coder, FNP  Fluticasone-Umeclidin-Vilant (TRELEGY ELLIPTA) 100-62.5-25 MCG/INH AEPB Inhale 1 puff into the lungs daily. 02/14/20  Yes Tyler Pita, MD  gabapentin (NEURONTIN) 600 MG tablet TAKE 1 TABLET BY MOUTH THREE TIMES A DAY 01/04/20  Yes Arnett, Yvetta Coder, FNP  HUMIRA PEN 40 MG/0.4ML PNKT Inject 40 mg as directed. Every 2 weeks 07/07/18  Yes [provider]  hydrochlorothiazide (HYDRODIURIL) 25 MG tablet Take 1 tablet (25 mg total) by mouth daily. 01/20/20 04/19/20 Yes Loel Dubonnet, NP  hydroxychloroquine (PLAQUENIL) 200 MG tablet Take 200 mg by mouth 2 (two) times daily.  06/21/16  Yes [provider]  hydrOXYzine (ATARAX/VISTARIL) 50 MG tablet Take 0.5-1 tablets (25-50 mg total) by mouth daily as needed for anxiety. Patient has supplies Patient taking differently: Take 50 mg by mouth at bedtime. Can take an extra 50 mg if needed 10/27/19  Yes Ursula Alert, MD  hyoscyamine (  LEVSIN) 0.125 MG tablet Take 0.125 mg by mouth as needed.   Yes [provider]  propranolol ER (INDERAL LA) 120 MG 24 hr capsule Take 1 capsule (120 mg total) by mouth daily. 05/27/19  Yes Dunn, Ryan M, PA-C  sertraline (ZOLOFT) 50 MG tablet TAKE 1 TABLET BY MOUTH EVERY DAY 01/18/20  Yes Ursula Alert, MD  traZODone (DESYREL) 50 MG tablet Take 1 tablet (50 mg total) by mouth at bedtime. 01/19/20  Yes Ursula Alert, MD    Allergies Latex  Family History  Problem Relation Age of Onset  . Depression Mother   . Pancreatic cancer Mother   . Suicidality Mother   . Colon cancer Father   . Breast cancer Sister 43  . Diabetes Brother   . Breast  cancer Cousin   . Neuropathy Neg Hx     Social History Social History   Tobacco Use  . Smoking status: Former Smoker    Packs/day: 1.00    Years: 40.00    Pack years: 40.00    Quit date: 12/16/2011    Years since quitting: 8.1  . Smokeless tobacco: Never Used  Substance Use Topics  . Alcohol use: Not Currently    Alcohol/week: 0.0 standard drinks    Comment: Occasionally has a drink  . Drug use: No    Review of Systems  Review of Systems  Unable to perform ROS: Mental status change  Constitutional: Positive for chills, fatigue and fever.  Gastrointestinal: Positive for abdominal pain, nausea and vomiting.  Psychiatric/Behavioral: Positive for confusion.     ____________________________________________  PHYSICAL EXAM:      VITAL SIGNS: ED Triage Vitals  Enc Vitals Group     BP 02/16/20 1030 (!) 144/87     Pulse Rate 02/16/20 1030 (!) 110     Resp 02/16/20 1030 (!) 25     Temp 02/16/20 1030 (!) 100.4 F (38 C)     Temp Source 02/16/20 1030 Oral     SpO2 02/16/20 1030 92 %     Weight 02/16/20 1031 194 lb 0.1 oz (88 kg)     Height 02/16/20 1031 5\' 7"  (1.702 m)     Head Circumference --      Peak Flow --      Pain Score --      Pain Loc --      Pain Edu? --      Excl. in [facility]? --      Physical Exam Vitals and nursing note reviewed.  Constitutional:      General: She is not in acute distress.    Appearance: She is well-developed.  HENT:     Head: Normocephalic and atraumatic.     Mouth/Throat:     Mouth: Mucous membranes are dry.  Eyes:     Conjunctiva/sclera: Conjunctivae normal.  Cardiovascular:     Rate and Rhythm: Regular rhythm. Tachycardia present.     Heart sounds: Normal heart sounds. No murmur. No friction rub.  Pulmonary:     Effort: Pulmonary effort is normal. No respiratory distress.     Breath sounds: Normal breath sounds. No wheezing or rales.  Abdominal:     General: Abdomen is flat. Bowel sounds are normal. There is no distension.      Palpations: Abdomen is soft.     Tenderness: There is abdominal tenderness in the suprapubic area. There is no guarding or rebound.  Musculoskeletal:     Cervical back: Neck supple.  Skin:    General:  Skin is warm.     Capillary Refill: Capillary refill takes less than 2 seconds.  Neurological:     Mental Status: She is alert and oriented to person, place, and time.     Motor: No abnormal muscle tone.       ____________________________________________   LABS (all labs ordered are listed, but only abnormal results are displayed)  Labs Reviewed  COMPREHENSIVE METABOLIC PANEL - Abnormal; Notable for the following components:      Result Value   Sodium 130 (*)    CO2 20 (*)    Glucose, Bld 114 (*)    BUN 30 (*)    Creatinine, Ser 1.13 (*)    Albumin 3.3 (*)    Total Bilirubin 1.3 (*)    GFR calc non Af Amer 49 (*)    GFR calc Af Amer 57 (*)    All other components within normal limits  CBC WITH DIFFERENTIAL/PLATELET - Abnormal; Notable for the following components:   Hemoglobin 10.6 (*)    HCT 35.3 (*)    MCV 79.9 (*)    MCH 24.0 (*)    RDW 18.1 (*)    Platelets 138 (*)    Lymphs Abs 0.4 (*)    All other components within normal limits  PROTIME-INR - Abnormal; Notable for the following components:   Prothrombin Time 22.1 (*)    INR 1.9 (*)    All other components within normal limits  URINALYSIS, COMPLETE (UACMP) WITH MICROSCOPIC - Abnormal; Notable for the following components:   Color, Urine YELLOW (*)    APPearance HAZY (*)    Specific Gravity, Urine 1.035 (*)    Hgb urine dipstick SMALL (*)    Protein, ur 30 (*)    Leukocytes,Ua MODERATE (*)    All other components within normal limits  RESPIRATORY PANEL BY RT PCR (FLU A&B, COVID)  CULTURE, BLOOD (ROUTINE X 2)  CULTURE, BLOOD (ROUTINE X 2)  URINE CULTURE  LACTIC ACID, PLASMA  POC SARS CORONAVIRUS 2 AG -  ED  POC SARS CORONAVIRUS 2 AG    ____________________________________________  EKG: Atrial  fibrillation, rate 109.  QRS 98, QTc 499.  No acute ST elevations or depressions.  Borderline left axis deviation. ________________________________________  RADIOLOGY All imaging, including plain films, CT scans, and ultrasounds, independently reviewed by me, and interpretations confirmed via formal radiology reads.  ED MD interpretation:   CT abdomen/pelvis: Mild right basilar atelectasis without effusion Chest x-ray: Minimal atelectasis and pleural fluid on the right  Official radiology report(s): CT ABDOMEN PELVIS W CONTRAST  Result Date: 02/16/2020 CLINICAL DATA:  Nausea and vomiting EXAM: CT ABDOMEN AND PELVIS WITH CONTRAST TECHNIQUE: Multidetector CT imaging of the abdomen and pelvis was performed using the standard protocol following bolus administration of intravenous contrast. CONTRAST:  1104mL OMNIPAQUE IOHEXOL 300 MG/ML  SOLN COMPARISON:  02/19/2019 FINDINGS: Lower chest: Mild right basilar atelectasis is noted. No sizable effusion is noted. Hepatobiliary: No focal liver abnormality is seen. Status post cholecystectomy. No biliary dilatation. Pancreas: Unremarkable. No pancreatic ductal dilatation or surrounding inflammatory changes. Spleen: Normal in size without focal abnormality. Adrenals/Urinary Tract: Adrenal glands are within normal limits. Normal enhancement of the kidneys is seen bilaterally. Normal excretion is noted bilaterally. No obstructive changes are seen. The bladder is well distended. Stomach/Bowel: Diverticular change of the colon is noted without evidence of diverticulitis. The appendix is not well visualized consistent with the known surgical history. No small bowel obstructive changes are seen. The stomach is within normal  limits. Vascular/Lymphatic: Mild ectasia of the infrarenal aorta to 2.9 cm is noted. Tortuosity is seen as well as diffuse atherosclerotic calcifications. No lymphadenopathy is seen. Reproductive: Status post hysterectomy. No adnexal masses. Other: No  abdominal wall hernia or abnormality. No abdominopelvic ascites. Musculoskeletal: No acute or significant osseous findings. IMPRESSION: Mild right basilar atelectasis without sizable effusion. Diverticulosis without diverticulitis. Mild ectasia of the infrarenal aorta. Electronically Signed   By: Inez Catalina M.D.   On: 02/16/2020 12:28   DG Chest Portable 1 View  Result Date: 02/16/2020 CLINICAL DATA:  Fever and altered mental status. EXAM: PORTABLE CHEST 1 VIEW COMPARISON:  07/09/2019 FINDINGS: Mild streaky atelectasis about the right minor fissure. There is no edema, consolidation, or pneumothorax. Blunting of the lateral right costophrenic sulcus. Cardiomegaly and aortic tortuosity. No edema, effusion, or pneumothorax. IMPRESSION: Minimal atelectasis and pleural fluid on the right. Electronically Signed   By: Monte Fantasia M.D.   On: 02/16/2020 11:12    ____________________________________________  PROCEDURES   Procedure(s) performed (including Critical Care):  .Critical Care Performed by: Duffy Bruce, MD Authorized by: Duffy Bruce, MD   Critical care provider statement:    Critical care time (minutes):  35   Critical care time was exclusive of:  Separately billable procedures and treating other patients and teaching time   Critical care was necessary to treat or prevent imminent or life-threatening deterioration of the following conditions:  Cardiac failure, respiratory failure, sepsis and circulatory failure   Critical care was time spent personally by me on the following activities:  Development of treatment plan with patient or surrogate, discussions with consultants, evaluation of patient's response to treatment, examination of patient, obtaining history from patient or surrogate, ordering and performing treatments and interventions, ordering and review of laboratory studies, ordering and review of radiographic studies, pulse oximetry, re-evaluation of patient's condition and  review of old charts   I assumed direction of critical care for this patient from another provider in my specialty: no      ____________________________________________  INITIAL IMPRESSION / MDM / [place] / ED COURSE  As part of my medical decision making, I reviewed the following data within the [place] notes reviewed and incorporated, Old chart reviewed, Notes from prior ED visits, and [place] [employer] was evaluated in Emergency Department on 02/16/2020 for the symptoms described in the history of present illness. She was evaluated in the context of the global COVID-19 pandemic, which necessitated consideration that the patient might be at risk for infection with the SARS-CoV-2 virus that causes COVID-19. Institutional protocols and algorithms that pertain to the evaluation of patients at risk for COVID-19 are in a state of rapid change based on information released by regulatory bodies including the CDC and federal and state organizations. These policies and algorithms were followed during the patient's care in the ED.  Some ED evaluations and interventions may be delayed as a result of limited staffing during the pandemic.*     Medical Decision Making:  74 yo F here with fever, AMS. Suspect sepsis likely 2/2 UTI, possible early R basilar PNA. Pt febrile, confused on arrival so sepsis protocol initiated. CXR, CT as above. Normal LA. No signs of septic shock. UA c/w UTI. Pt was given broad spectrum ABX, fluids. Will admit. No HA, neck stiffness, or s/s meningitis or encephalitis.   ____________________________________________  FINAL CLINICAL IMPRESSION(S) / ED DIAGNOSES  Final diagnoses:  Sepsis  without acute organ dysfunction, due to unspecified organism [facility])     MEDICATIONS GIVEN DURING THIS VISIT:  Medications  acetaminophen (TYLENOL) tablet 650 mg (has no administration in time range)    Or   acetaminophen (TYLENOL) suppository 650 mg (has no administration in time range)  ondansetron (ZOFRAN) tablet 4 mg (has no administration in time range)    Or  ondansetron (ZOFRAN) injection 4 mg (has no administration in time range)  morphine 2 MG/ML injection 1 mg (has no administration in time range)  0.9 %  sodium chloride infusion (has no administration in time range)  hydroxychloroquine (PLAQUENIL) tablet 200 mg (has no administration in time range)  propranolol ER (INDERAL LA) 24 hr capsule 120 mg (has no administration in time range)  busPIRone (BUSPAR) tablet 10 mg (10 mg Oral Given 02/16/20 1605)  hydrOXYzine (ATARAX/VISTARIL) tablet 50 mg (has no administration in time range)  sertraline (ZOLOFT) tablet 50 mg (50 mg Oral Given 02/16/20 1605)  traZODone (DESYREL) tablet 50 mg (has no administration in time range)  lipase/protease/amylase (CREON) capsule 36,000 Units (has no administration in time range)  hyoscyamine (LEVSIN) tablet 0.125 mg (has no administration in time range)  pantoprazole (PROTONIX) EC tablet 40 mg (40 mg Oral Given 02/16/20 1605)  apixaban (ELIQUIS) tablet 5 mg (has no administration in time range)  gabapentin (NEURONTIN) tablet 600 mg (600 mg Oral Given 02/16/20 1605)  vancomycin (VANCOCIN) IVPB 1000 mg/200 mL premix (1,000 mg Intravenous New Bag/Given 02/16/20 1605)  ceFEPIme (MAXIPIME) 2 g in sodium chloride 0.9 % 100 mL IVPB (has no administration in time range)  fluticasone furoate-vilanterol (BREO ELLIPTA) 100-25 MCG/INH 1 puff (has no administration in time range)    And  umeclidinium bromide (INCRUSE ELLIPTA) 62.5 MCG/INH 1 puff (has no administration in time range)  lactated ringers bolus 1,000 mL (0 mLs Intravenous Stopped 02/16/20 1415)    And  lactated ringers bolus 1,000 mL (0 mLs Intravenous Stopped 02/16/20 1254)    And  lactated ringers bolus 1,000 mL (0 mLs Intravenous Stopped 02/16/20 1254)  ceFEPIme (MAXIPIME) 2 g in sodium chloride 0.9 % 100 mL  IVPB ( Intravenous Stopped 02/16/20 1146)  metroNIDAZOLE (FLAGYL) IVPB 500 mg (0 mg Intravenous Stopped 02/16/20 1254)  vancomycin (VANCOCIN) IVPB 1000 mg/200 mL premix (0 mg Intravenous Stopped 02/16/20 1415)  acetaminophen (TYLENOL) tablet 650 mg (650 mg Oral Given 02/16/20 1304)  iohexol (OMNIPAQUE) 300 MG/ML solution 100 mL (100 mLs Intravenous Contrast Given 02/16/20 1209)     ED Discharge Orders    None       Note:  This document was prepared using Dragon voice recognition software and may include unintentional dictation errors.   Duffy Bruce, MD 02/16/20 [phone]

## 2020-02-16 NOTE — H&P (Addendum)
TRH H&P   Patient Demographics:    Brandy Armstrong, is a 74 y.o. female  MRN: [id number]   DOB - November 02, 1946  Admit Date - 02/16/2020  Outpatient Primary MD for the patient is Arnett, Yvetta Coder, FNP  Referring MD: Dr. Bland Span  Outpatient Specialists: None  Patient coming from: Home  Chief Complaint  Patient presents with  . Altered Mental Status  . Fever      HPI:    Brandy Armstrong  is a 74 y.o. female, with history of A. fib on anticoagulation, chronic pancreatic insufficiency, chronic back pain COPD, rheumatoid arthritis on methotrexate and Arentz who was brought with confusion.  Patient lives alone and is capable of her ADLs.  Cousin at bedside who reports that patient has been confused since yesterday and more this morning.  On repeated questioning patient reports that she had subjective chills and pain in her right flank.  Also reported having some dysuria.  She was found to have fever of 101 F.  History limited due to her confusion.  She denied any headache, blurred vision, nausea, vomiting, chest pain, cough, shortness of breath, diarrhea.  Reports feeling weak.  Denies any sick contact or exposure to COVID-19.  Denies any change in her medication recently.  Course in the ED She was tachycardic to 110s, fever 100.4 F, respiratory rate of 25 and blood pressure 144/87 mmHg.  maintained sat on room air at 92%.  Patient met criteria for sepsis.  UA showed moderate leukocyte, 21-50 WBC. CBC showed hemoglobin of 10.6 (baseline), platelets of 138.  Chemistry showed sodium of 130, BUN of 30 and creatinine of 1.13, suggestive of mild AKI, blood glucose of 114.  Lactic acid was normal.  COVID-19 tested negative.  Chest x-ray showed minimal atelectasis.  CT of the abdomen pelvis with contrast showed diverticulosis without diverticulitis.  No renal stone or hydronephrosis.  Patient given  empiric vancomycin and cefepime along with IV fluids and hospitalist consulted for admission to telemetry for sepsis.    Review of systems:    In addition to the HPI above,  Fever and chills + +, generalized weakness No Headache, No changes with Vision or hearing, No problems swallowing food or Liquids, No Chest pain, Cough or Shortness of Breath, Right flank pain, nausea,?  Vomiting, bowel movements are regular, No Blood in stool or Urine, No dysuria, No new skin rashes or bruises, No new joints pains-aches,  No new weakness, tingling, numbness in any extremity, No recent weight gain or loss, No polyuria, polydypsia or polyphagia, No significant Mental Stressors.  A full 10 point Review of Systems was done, except as stated above, all other Review of Systems were negative.   With Past History of the following :    Past Medical History:  Diagnosis Date  . A-fib ([place])   . Barretts esophagus   . Chest pain  a. 02/2014 Myoview: Ef 50%, no ischemia.  . Colon polyps   . COPD (chronic obstructive pulmonary disease) ([place])   . Depression with anxiety   . GERD (gastroesophageal reflux disease)   . Hx of benign essential tremor   . Neuropathy   . Permanent atrial fibrillation [facility])    a. s/p ablation 01/2012 in [facility] by Dr. Boyd Kerbs;  b. On sotalol & Xarelto;  c. 02/2014 Echo: EF 50-55%, mild conc LVH, nl LA size/structure;  d. Recurrent afib 8/15 & 08/29/2014.  [name] Rectal fistula   . Rheumatoid arthritis ([place])    On methotrexate and orencia  . Urinary incontinence   . Vitamin D deficiency       Past Surgical History:  Procedure Laterality Date  . ABDOMINAL HYSTERECTOMY  1992  . APPENDECTOMY    . CARDIAC ELECTROPHYSIOLOGY STUDY AND ABLATION  2013  . CHOLECYSTECTOMY  1987  . COLONOSCOPY N/A 05/08/2015   Procedure: COLONOSCOPY;  Surgeon: Manya Silvas, MD;  Location: [facility] ENDOSCOPY;  Service: Endoscopy;  Laterality: N/A;  . COLONOSCOPY WITH PROPOFOL N/A 09/29/2019    Procedure: COLONOSCOPY WITH PROPOFOL;  Surgeon: Toledo, Benay Pike, MD;  Location: [facility] ENDOSCOPY;  Service: Gastroenterology;  Laterality: N/A;  . ESOPHAGOGASTRODUODENOSCOPY N/A 05/06/2015   Procedure: ESOPHAGOGASTRODUODENOSCOPY (EGD);  Surgeon: Lollie Sails, MD;  Location: [facility] ENDOSCOPY;  Service: Endoscopy;  Laterality: N/A;  . ESOPHAGOGASTRODUODENOSCOPY (EGD) WITH PROPOFOL N/A 09/29/2019   Procedure: ESOPHAGOGASTRODUODENOSCOPY (EGD) WITH PROPOFOL;  Surgeon: Toledo, Benay Pike, MD;  Location: [facility] ENDOSCOPY;  Service: Gastroenterology;  Laterality: N/A;  . LAPAROSCOPIC APPENDECTOMY N/A 02/19/2019   Procedure: APPENDECTOMY LAPAROSCOPIC converted to open appendectomy;  Surgeon: Herbert Pun, MD;  Location: [facility] ORS;  Service: General;  Laterality: N/A;  . OOPHORECTOMY    . oophrectomy Bilateral 1992      Social History:     Social History   Tobacco Use  . Smoking status: Former Smoker    Packs/day: 1.00    Years: 40.00    Pack years: 40.00    Quit date: 12/16/2011    Years since quitting: 8.1  . Smokeless tobacco: Never Used  Substance Use Topics  . Alcohol use: Not Currently    Alcohol/week: 0.0 standard drinks    Comment: Occasionally has a drink     Lives -alone at home  Mobility -independent     Family History :     Family History  Problem Relation Age of Onset  . Depression Mother   . Pancreatic cancer Mother   . Suicidality Mother   . Colon cancer Father   . Breast cancer Sister 97  . Diabetes Brother   . Breast cancer Cousin   . Neuropathy Neg Hx      Home Medications:   Prior to Admission medications   Medication Sig Start Date End Date Taking? Authorizing Provider  busPIRone (BUSPAR) 10 MG tablet Take 1 tablet (10 mg total) by mouth 3 (three) times daily. 01/19/20  Yes Ursula Alert, MD  CREON 36000 units CPEP capsule Takes 2 capsules three times daily. 10/21/19  Yes [provider]  cyclobenzaprine (FLEXERIL) 5 MG tablet Take 1  tablet (5 mg total) by mouth 3 (three) times daily as needed for muscle spasms. 02/14/20  Yes Arnett, Yvetta Coder, FNP  ELIQUIS 5 MG TABS tablet Take 1 tablet (5 mg total) by mouth 2 (two) times daily. 06/24/19  Yes Minna Merritts, MD  esomeprazole (NEXIUM) 20 MG capsule TAKE 1 CAPSULE BY MOUTH EVERY DAY 09/29/19  Yes [name], FNP  Fluticasone-Umeclidin-Vilant (TRELEGY ELLIPTA) 100-62.5-25 MCG/INH AEPB Inhale 1 puff into the lungs daily. 02/14/20  Yes Tyler Pita, MD  gabapentin (NEURONTIN) 600 MG tablet TAKE 1 TABLET BY MOUTH THREE TIMES A DAY 01/04/20  Yes Arnett, Yvetta Coder, FNP  HUMIRA PEN 40 MG/0.4ML PNKT Inject 40 mg as directed. Every 2 weeks 07/07/18  Yes [provider]  hydrochlorothiazide (HYDRODIURIL) 25 MG tablet Take 1 tablet (25 mg total) by mouth daily. 01/20/20 04/19/20 Yes Loel Dubonnet, NP  hydroxychloroquine (PLAQUENIL) 200 MG tablet Take 200 mg by mouth 2 (two) times daily.  06/21/16  Yes [provider]  hydrOXYzine (ATARAX/VISTARIL) 50 MG tablet Take 0.5-1 tablets (25-50 mg total) by mouth daily as needed for anxiety. Patient has supplies Patient taking differently: Take 50 mg by mouth at bedtime. Can take an extra 50 mg if needed 10/27/19  Yes Eappen, Saramma, MD  hyoscyamine (LEVSIN) 0.125 MG tablet Take 0.125 mg by mouth as needed.   Yes [provider]  propranolol ER (INDERAL LA) 120 MG 24 hr capsule Take 1 capsule (120 mg total) by mouth daily. 05/27/19  Yes Dunn, Ryan M, PA-C  sertraline (ZOLOFT) 50 MG tablet TAKE 1 TABLET BY MOUTH EVERY DAY 01/18/20  Yes Ursula Alert, MD  traZODone (DESYREL) 50 MG tablet Take 1 tablet (50 mg total) by mouth at bedtime. 01/19/20  Yes Ursula Alert, MD     Allergies:     Allergies  Allergen Reactions  . Latex     Itching Itching     Physical Exam:   Vitals  Blood pressure (!) 145/88, pulse (!) 108, temperature 99.1 F (37.3 C), resp. rate (!) 23, height '5\' 7"'$  (1.702 m), weight 88  kg, SpO2 92 %.   General: Elderly obese female lying in bed appears fatigued, not in acute distress HEENT: Was reactive bilaterally, EOMI, pallor present, dry mucosa, supple neck, no cervical adenopathy Chest: Clear to auscultation bilaterally CVs: S1-S2 tachycardic, regular, no murmurs rub or gallop GI: Soft, no abdominal distention, nontender, bowel sounds present, bilateral CVA tenderness to pressure Musculoskeletal: Warm, trace pedal edema CNS: Alert and oriented to place and person, nonfocal, fine tremors    Data Review:    CBC Recent Labs  Lab 02/16/20 1052  WBC 8.0  HGB 10.6*  HCT 35.3*  PLT 138*  MCV 79.9*  MCH 24.0*  MCHC 30.0  RDW 18.1*  LYMPHSABS 0.4*  MONOABS 0.6  EOSABS 0.0  BASOSABS 0.0   ------------------------------------------------------------------------------------------------------------------  Chemistries  Recent Labs  Lab 02/16/20 1052  NA 130*  K 4.0  CL 98  CO2 20*  GLUCOSE 114*  BUN 30*  CREATININE 1.13*  CALCIUM 9.1  AST 21  ALT 9  ALKPHOS 82  BILITOT 1.3*   ------------------------------------------------------------------------------------------------------------------ estimated creatinine clearance is 52 mL/min (A) (by C-G formula based on SCr of 1.13 mg/dL (H)). ------------------------------------------------------------------------------------------------------------------ No results for input(s): TSH, T4TOTAL, T3FREE, THYROIDAB in the last 72 hours.  Invalid input(s): FREET3  Coagulation profile Recent Labs  Lab 02/16/20 1052  INR 1.9*   ------------------------------------------------------------------------------------------------------------------- No results for input(s): DDIMER in the last 72 hours. -------------------------------------------------------------------------------------------------------------------  Cardiac Enzymes No results for input(s): CKMB, TROPONINI, MYOGLOBIN in the last 168  hours.  Invalid input(s): CK ------------------------------------------------------------------------------------------------------------------    Component Value Date/Time   BNP 1,457 (H) 01/02/2015 1545     ---------------------------------------------------------------------------------------------------------------  Urinalysis    Component Value Date/Time   COLORURINE YELLOW (A) 02/16/2020 1233   APPEARANCEUR HAZY (A) 02/16/2020  1233   LABSPEC 1.035 (H) 02/16/2020 1233   PHURINE 5.0 02/16/2020 1233   GLUCOSEU NEGATIVE 02/16/2020 1233   GLUCOSEU NEGATIVE 08/06/2016 1143   HGBUR SMALL (A) 02/16/2020 1233   BILIRUBINUR NEGATIVE 02/16/2020 1233   BILIRUBINUR neg 03/07/2015 1633   KETONESUR NEGATIVE 02/16/2020 1233   PROTEINUR 30 (A) 02/16/2020 1233   UROBILINOGEN 0.2 08/06/2016 1143   NITRITE NEGATIVE 02/16/2020 1233   LEUKOCYTESUR MODERATE (A) 02/16/2020 1233    ----------------------------------------------------------------------------------------------------------------   Imaging Results:    CT ABDOMEN PELVIS W CONTRAST  Result Date: 02/16/2020 CLINICAL DATA:  Nausea and vomiting EXAM: CT ABDOMEN AND PELVIS WITH CONTRAST TECHNIQUE: Multidetector CT imaging of the abdomen and pelvis was performed using the standard protocol following bolus administration of intravenous contrast. CONTRAST:  122m OMNIPAQUE IOHEXOL 300 MG/ML  SOLN COMPARISON:  02/19/2019 FINDINGS: Lower chest: Mild right basilar atelectasis is noted. No sizable effusion is noted. Hepatobiliary: No focal liver abnormality is seen. Status post cholecystectomy. No biliary dilatation. Pancreas: Unremarkable. No pancreatic ductal dilatation or surrounding inflammatory changes. Spleen: Normal in size without focal abnormality. Adrenals/Urinary Tract: Adrenal glands are within normal limits. Normal enhancement of the kidneys is seen bilaterally. Normal excretion is noted bilaterally. No obstructive changes are seen.  The bladder is well distended. Stomach/Bowel: Diverticular change of the colon is noted without evidence of diverticulitis. The appendix is not well visualized consistent with the known surgical history. No small bowel obstructive changes are seen. The stomach is within normal limits. Vascular/Lymphatic: Mild ectasia of the infrarenal aorta to 2.9 cm is noted. Tortuosity is seen as well as diffuse atherosclerotic calcifications. No lymphadenopathy is seen. Reproductive: Status post hysterectomy. No adnexal masses. Other: No abdominal wall hernia or abnormality. No abdominopelvic ascites. Musculoskeletal: No acute or significant osseous findings. IMPRESSION: Mild right basilar atelectasis without sizable effusion. Diverticulosis without diverticulitis. Mild ectasia of the infrarenal aorta. Electronically Signed   By: MInez CatalinaM.D.   On: 02/16/2020 12:28   DG Chest Portable 1 View  Result Date: 02/16/2020 CLINICAL DATA:  Fever and altered mental status. EXAM: PORTABLE CHEST 1 VIEW COMPARISON:  07/09/2019 FINDINGS: Mild streaky atelectasis about the right minor fissure. There is no edema, consolidation, or pneumothorax. Blunting of the lateral right costophrenic sulcus. Cardiomegaly and aortic tortuosity. No edema, effusion, or pneumothorax. IMPRESSION: Minimal atelectasis and pleural fluid on the right. Electronically Signed   By: JMonte FantasiaM.D.   On: 02/16/2020 11:12    My personal review of EKG: A. fib at 102 with QTC of 499.   Assessment & Plan:   Principal problem Sepsis ([place]  No clear source was suspected could be due to UTI with acute pyelonephritis given positive UA and CVA tenderness. Admit to telemetry.  Empiric IV vancomycin and cefepime.  Blood and urine culture sent from the ED. IV hydration with normal saline, pain control with Tylenol and low-dose IV morphine if needed.  Supportive care with antiemetics.  Acute toxic metabolic encephalopathy Secondary to sepsis.  Continue  neurochecks.  Minimize narcotics and avoid benzos.  Continue neurochecks.  Active Problems: Paroxysmal A. fib Rate controlled.  Continue Eliquis  Hyponatremia  possibly with dehydration and hypovolemia. Monitor with fluids     COPD (chronic obstructive pulmonary disease) (HCC) Stable.  Continue home inhaler    Tremors of nervous system On  propanolol   Peripheral neuropathy Continue Neurontin    [place] Plaquenil and Humira as outpatient    Iron deficiency anemia due to chronic blood loss  H&H stable    Obesity, Class II, BMI 35-39.9    MDD (major depressive disorder), recurrent episode, moderate (HCC) Continue trazodone, BuSpar and Zoloft.  QTC borderline elevated at 499.  Monitor closely.         DVT Prophylaxis eliquis  AM Labs Ordered, also please review Full Orders  Family Communication: Admission, patients condition and plan of care including tests being ordered have been discussed with the patient and and her cousin at bedside.  Patient lives independently at home  Code Status full code  Likely DC to home once sepsis resolved  Condition: [place] called: None  Admission status: Inpatient  It is my opinion that patient is presenting with severe sepsis secondary to unclear source (possible UTI) with fever, tachycardia and tachypnea with acute metabolic encephalopathy.  Patient is at high risk of further clinical deterioration resulting in septic shock, persistent encephalopathy and needs to be monitored in an inpatient setting with IV hydration, empiric IV antibiotic and monitoring of her mental status.  For this patient needs to be monitored closely as an inpatient for at least >2 midnight.  Time spent in minutes : 70   Divine Imber M.D on 02/16/2020 at 2:08 PM  Between 7am to 7pm - Pager - [phone]. After 7pm go to [email or web address] - password [facility]  [facility] - Office  [phone]

## 2020-02-16 NOTE — Telephone Encounter (Signed)
Pt in ed 

## 2020-02-16 NOTE — Telephone Encounter (Signed)
See previous note en-route to [facility] by EMS

## 2020-02-16 NOTE — Telephone Encounter (Signed)
[name] access nurse called in and said patient was confused and disoriented. Her cousin said she could barely walk to the bathroom. They were trying to see Brandy Armstrong today. I told them she had no availability so the nurse suggested and I agreed the patient she should go to the ER or urgent care.

## 2020-02-16 NOTE — Telephone Encounter (Signed)
I have tried to call patient & I do not know what cousin called for patient? I called the friend who is listed in patient's chart but was also unable to reach. I wanted to see how patient was doing & make sure that someone was taking her to ED.

## 2020-02-16 NOTE — Telephone Encounter (Signed)
Spoke with family member EMS is there and en-route with patient to [facility].

## 2020-02-16 NOTE — Telephone Encounter (Signed)
Brandy Armstrong (pt's cousin) called and states that pt is having upper back pain, trouble walking, and confusion. She states that this started yesterday afternoon. I transferred to [facility] at [employer] Nurse line.

## 2020-02-16 NOTE — Progress Notes (Signed)
CODE SEPSIS - PHARMACY COMMUNICATION  **Broad Spectrum Antibiotics should be administered within 1 hour of Sepsis diagnosis**  Time Code Sepsis Called/Page Received: 1103  Antibiotics Ordered: vanc/cefepime/metronidazole  Time of 1st antibiotic administration: 1120  Additional action taken by pharmacy: NA  If necessary, Name of Provider/Nurse Contacted: NA    Tawnya Crook ,PharmD Clinical Pharmacist  02/16/2020  11:49 AM

## 2020-02-16 NOTE — ED Notes (Signed)
Pt to CT at this time.

## 2020-02-16 NOTE — ED Triage Notes (Signed)
Pt to ER via EMS form home with c/o altered mental status and fever.  Pt was altered yesterday but refused for family to bring her to hospital.  Today she is alert to self only.  Report temp of 101.2.

## 2020-02-16 NOTE — Consult Note (Signed)
Pharmacy Antibiotic Note  Brandy Armstrong is a 74 y.o. female admitted on 02/16/2020 with sepsis.  Pharmacy has been consulted for Vancomycin and Cefepime dosing.  Plan:  1) Patient received Vancomycin 2g IV total in the ED as a loading dose. Will initiate maintenance dose of:  Vancomycin 1250 mg IV Q 24 hrs. Goal AUC 400-550. Expected AUC: 478.2 Expected Css: 12.4 SCr used: 1.13  2) Cefepime 2g IV Q12 hours. Dose adjusted due to CrCl between 30-37ml/min  Will continue to monitor renal function and adjust doses as renal function improves.   Height: 5\' 7"  (170.2 cm) Weight: 194 lb 0.1 oz (88 kg) IBW/kg (Calculated) : 61.6  Temp (24hrs), Avg:99.8 F (37.7 C), Min:99.1 F (37.3 C), Max:100.4 F (38 C)  Recent Labs  Lab 02/16/20 1052  WBC 8.0  CREATININE 1.13*  LATICACIDVEN 1.1    Estimated Creatinine Clearance: 52 mL/min (A) (by C-G formula based on SCr of 1.13 mg/dL (H)).    Allergies  Allergen Reactions  . Latex     Itching Itching    Antimicrobials this admission: Vancomycin 2/17 >>  Cefepime 2/17 >>   Microbiology results: 2/17 BCx: pending 2/17 UCx: pending   Thank you for allowing pharmacy to be a part of this patient's care.  Brandy Armstrong 02/16/2020 3:35 PM

## 2020-02-16 NOTE — Progress Notes (Signed)
PHARMACY -  BRIEF ANTIBIOTIC NOTE   Pharmacy has received consult(s) for vanc and cefepime from an ED provider.  The patient's profile has been reviewed for ht/wt/allergies/indication/available labs.    One time order(s) placed for vanc 1 g + cefepime 2 g  Further antibiotics/pharmacy consults should be ordered by admitting physician if indicated.                       Thank you,  Tawnya Crook, PharmD 02/16/2020  11:03 AM

## 2020-02-17 ENCOUNTER — Inpatient Hospital Stay (HOSPITAL_COMMUNITY)
Admit: 2020-02-17 | Discharge: 2020-02-17 | Disposition: A | Discharge status: Home / Self Care | Payer: Medicare Other | Attending: Family Medicine | Admitting: Family Medicine

## 2020-02-17 ENCOUNTER — Inpatient Hospital Stay: Payer: Medicare Other

## 2020-02-17 DIAGNOSIS — B9561 Methicillin susceptible Staphylococcus aureus infection as the cause of diseases classified elsewhere: Secondary | ICD-10-CM

## 2020-02-17 DIAGNOSIS — D696 Thrombocytopenia, unspecified: Secondary | ICD-10-CM

## 2020-02-17 DIAGNOSIS — R7881 Bacteremia: Secondary | ICD-10-CM

## 2020-02-17 DIAGNOSIS — K2270 Barrett's esophagus without dysplasia: Secondary | ICD-10-CM

## 2020-02-17 DIAGNOSIS — K529 Noninfective gastroenteritis and colitis, unspecified: Secondary | ICD-10-CM

## 2020-02-17 DIAGNOSIS — M069 Rheumatoid arthritis, unspecified: Secondary | ICD-10-CM

## 2020-02-17 DIAGNOSIS — J9601 Acute respiratory failure with hypoxia: Secondary | ICD-10-CM

## 2020-02-17 DIAGNOSIS — J449 Chronic obstructive pulmonary disease, unspecified: Secondary | ICD-10-CM

## 2020-02-17 DIAGNOSIS — N12 Tubulo-interstitial nephritis, not specified as acute or chronic: Secondary | ICD-10-CM

## 2020-02-17 DIAGNOSIS — G9340 Encephalopathy, unspecified: Secondary | ICD-10-CM

## 2020-02-17 DIAGNOSIS — F329 Major depressive disorder, single episode, unspecified: Secondary | ICD-10-CM

## 2020-02-17 DIAGNOSIS — K8689 Other specified diseases of pancreas: Secondary | ICD-10-CM

## 2020-02-17 DIAGNOSIS — Z87891 Personal history of nicotine dependence: Secondary | ICD-10-CM

## 2020-02-17 DIAGNOSIS — F411 Generalized anxiety disorder: Secondary | ICD-10-CM

## 2020-02-17 DIAGNOSIS — D500 Iron deficiency anemia secondary to blood loss (chronic): Secondary | ICD-10-CM

## 2020-02-17 DIAGNOSIS — I4891 Unspecified atrial fibrillation: Secondary | ICD-10-CM

## 2020-02-17 DIAGNOSIS — Z91040 Latex allergy status: Secondary | ICD-10-CM

## 2020-02-17 LAB — BLOOD CULTURE ID PANEL (REFLEXED)

## 2020-02-17 LAB — CBC
HCT: 32.3 % — ABNORMAL LOW (ref 36.0–46.0)
Hemoglobin: 9.7 g/dL — ABNORMAL LOW (ref 12.0–15.0)
MCH: 24.3 pg — ABNORMAL LOW (ref 26.0–34.0)
MCHC: 30 g/dL (ref 30.0–36.0)
MCV: 81 fL (ref 80.0–100.0)
Platelets: 120 10*3/uL — ABNORMAL LOW (ref 150–400)
RBC: 3.99 MIL/uL (ref 3.87–5.11)
RDW: 18.1 % — ABNORMAL HIGH (ref 11.5–15.5)
WBC: 6.7 10*3/uL (ref 4.0–10.5)
nRBC: 0 % (ref 0.0–0.2)

## 2020-02-17 LAB — URINE CULTURE

## 2020-02-17 LAB — BASIC METABOLIC PANEL
Anion gap: 7 (ref 5–15)
BUN: 20 mg/dL (ref 8–23)
CO2: 22 mmol/L (ref 22–32)
Calcium: 7.7 mg/dL — ABNORMAL LOW (ref 8.9–10.3)
Chloride: 106 mmol/L (ref 98–111)
Creatinine, Ser: 0.79 mg/dL (ref 0.44–1.00)
GFR calc Af Amer: >60 mL/min (ref >60)
GFR calc non Af Amer: >60 mL/min (ref >60)
Glucose, Bld: 90 mg/dL (ref 70–99)
Potassium: 3.5 mmol/L (ref 3.5–5.1)
Sodium: 135 mmol/L (ref 135–145)

## 2020-02-17 LAB — ECHOCARDIOGRAM COMPLETE
Height: 67 in
Weight: 3104.08 oz

## 2020-02-17 MED ORDER — CEFAZOLIN SODIUM-DEXTROSE 2-4 GM/100ML-% IV SOLN
2.0000 g | Freq: Three times a day (TID) | INTRAVENOUS | Status: DC
  Administered 2020-02-17 – 2020-02-23 (×19): 2 g via INTRAVENOUS
  Filled 2020-02-17 (×27): qty 100

## 2020-02-17 MED ORDER — ENSURE ENLIVE PO LIQD
237.0000 mL | Freq: Two times a day (BID) | ORAL | Status: DC
  Administered 2020-02-18 – 2020-02-21 (×7): 237 mL via ORAL

## 2020-02-17 MED ORDER — GADOBUTROL 1 MMOL/ML IV SOLN
7.5000 mL | Freq: Once | INTRAVENOUS | Status: AC | PRN
  Administered 2020-02-17: 7.5 mL via INTRAVENOUS
  Filled 2020-02-17: qty 7.5

## 2020-02-17 NOTE — Consult Note (Signed)
NAME: Brandy Armstrong  DOB: 11-29-1946  MRN: [id number]  Date/Time: 02/17/2020 11:35 AM  REQUESTING PROVIDER: Dr. Sarajane Jews Subjective:  REASON FOR CONSULT: Staph bacteremia ? Brandy Armstrong is a 74 y.o. female with a history of Chronic diarrhea, Barrett's esophagus Atrial fibrillation,Rheumatoid arthritis on methotrexate and Orencia,Pancreatic insufficiency Pancreatic divisum, generalized anxiety disorder Chronic anemia for ongoing blood loss from GI tract due to chronic anticoagulation. Presents to the emergency department on 02/16/2020 with altered mental status and fever. In the ED she had a temperature of 100.4, respiratory rate of 25, blood pressure of 144/87 and sats of 92%.  Labs revealed a hemoglobin of 10.6, WBC of 8, platelet of 138, creatinine of 1.13, blood cultures were sent and it was positive for MSSA 4 out of 4.  I am seeing the patient for the same.  She had CT of the abdomen and pelvis because of vomiting and that was not very revealing.  Chest x-ray showed minimal atelectasis on the bases and pleural fluid on the right. Had a virtual visit with her provider on 02/13/2020 and she had complained of right-sided upper back pain which started a day prior and was worsening..  She had taken gabapentin and tramadol without relief.  She did not complain of any numbness in back arms or legs. Past Medical History:  Diagnosis Date  . A-fib ([place])   . Barretts esophagus   . Chest pain    a. 02/2014 Myoview: Ef 50%, no ischemia.  . Colon polyps   . COPD (chronic obstructive pulmonary disease) ([place])   . Depression with anxiety   . GERD (gastroesophageal reflux disease)   . Hx of benign essential tremor   . Neuropathy   . Permanent atrial fibrillation [facility])    a. s/p ablation 01/2012 in [facility] by Dr. Boyd Kerbs;  b. On sotalol & Xarelto;  c. 02/2014 Echo: EF 50-55%, mild conc LVH, nl LA size/structure;  d. Recurrent afib 8/15 & 08/29/2014.  [name] Rectal fistula   . Rheumatoid arthritis ([place])    On  methotrexate and orencia  . Urinary incontinence   . Vitamin D deficiency     Past Surgical History:  Procedure Laterality Date  . ABDOMINAL HYSTERECTOMY  1992  . APPENDECTOMY    . CARDIAC ELECTROPHYSIOLOGY STUDY AND ABLATION  2013  . CHOLECYSTECTOMY  1987  . COLONOSCOPY N/A 05/08/2015   Procedure: COLONOSCOPY;  Surgeon: Manya Silvas, MD;  Location[facility] ENDOSCOPY;  Service: Endoscopy;  Laterality: N/A;  . COLONOSCOPY WITH PROPOFOL N/A 09/29/2019   Procedure: COLONOSCOPY WITH PROPOFOL;  Surgeon: Toledo, Benay Pike, MD;  Location: [facility] ENDOSCOPY;  Service: Gastroenterology;  Laterality: N/A;  . ESOPHAGOGASTRODUODENOSCOPY N/A 05/06/2015   Procedure: ESOPHAGOGASTRODUODENOSCOPY (EGD);  Surgeon: Lollie Sails, MD;  Location: [facility] ENDOSCOPY;  Service: Endoscopy;  Laterality: N/A;  . ESOPHAGOGASTRODUODENOSCOPY (EGD) WITH PROPOFOL N/A 09/29/2019   Procedure: ESOPHAGOGASTRODUODENOSCOPY (EGD) WITH PROPOFOL;  Surgeon: Toledo, Benay Pike, MD;  Location: [facility] ENDOSCOPY;  Service: Gastroenterology;  Laterality: N/A;  . LAPAROSCOPIC APPENDECTOMY N/A 02/19/2019   Procedure: APPENDECTOMY LAPAROSCOPIC converted to open appendectomy;  Surgeon: Herbert Pun, MD;  Location: [facility] ORS;  Service: General;  Laterality: N/A;  . OOPHORECTOMY    . oophrectomy Bilateral 1992    Social History   Socioeconomic History  . Marital status: Single    Spouse name: Not on file  . Number of children: 0  . Years of education: 16  . Highest education level: Bachelor's degree (e.g., BA, AB, BS)  Occupational History  .  Occupation: Retired  Tobacco Use  . Smoking status: Former Smoker    Packs/day: 1.00    Years: 40.00    Pack years: 40.00    Quit date: 12/16/2011    Years since quitting: 8.1  . Smokeless tobacco: Never Used  Substance and Sexual Activity  . Alcohol use: Not Currently    Alcohol/week: 0.0 standard drinks    Comment: Occasionally has a drink  . Drug use: No  . Sexual activity: Never   Other Topics Concern  . Not on file  Social History Narrative   [name] was born and reared in [place]. She attended [employer] and graduated in 1971 with her [place] in Education. 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 She then worked for a [employer] in [place]. She is single. She is currently retired. She loves reading. She loves to spend time with her dog.       Caffeine use: 2 cups coffee per day   2 glasses iced tea/ day   Right-handed   Social Determinants of Health   Financial Resource Strain: Medium Risk  . Difficulty of Paying Living Expenses: Somewhat hard  Food Insecurity: Food Insecurity Present  . Worried About Charity fundraiser in the Last Year: Sometimes true  . Ran Out of Food in the Last Year: Sometimes true  Transportation Needs: No Transportation Needs  . Lack of Transportation (Medical): No  . Lack of Transportation (Non-Medical): No  Physical Activity: Inactive  . Days of Exercise per Week: 0 days  . Minutes of Exercise per Session: 0 min  Stress: Stress Concern Present  . Feeling of Stress : To some extent  Social Connections: Unknown  . Frequency of Communication with Friends and Family: Not on file  . Frequency of Social Gatherings with Friends and Family: Not on file  . Attends Religious Services: More than 4 times per year  . Active Member of Clubs or Organizations: No  . Attends Archivist Meetings: Never  . Marital Status: Never married  Intimate Partner Violence: Not At Risk  . Fear of Current or Ex-Partner: No  . Emotionally Abused: No  . Physically Abused: No  . Sexually Abused: No    Family History  Problem Relation Age of Onset  . Depression Mother   . Pancreatic cancer Mother   . Suicidality Mother   . Colon cancer Father   . Breast cancer Sister 58  . Diabetes Brother   . Breast cancer Cousin   . Neuropathy Neg Hx    Allergies  Allergen Reactions   . Latex     Itching Itching   ? Current Facility-Administered Medications  Medication Dose Route Frequency Provider Last Rate Last Admin  . 0.9 %  sodium chloride infusion   Intravenous Continuous Dhungel, Nishant, MD 100 mL/hr at 02/17/20 0552 New Bag at 02/17/20 0552  . acetaminophen (TYLENOL) tablet 650 mg  650 mg Oral Q6H PRN Dhungel, Nishant, MD       Or  . acetaminophen (TYLENOL) suppository 650 mg  650 mg Rectal Q6H PRN Dhungel, Nishant, MD      . apixaban (ELIQUIS) tablet 5 mg  5 mg Oral BID Dhungel, Nishant, MD   5 mg at 02/17/20 0904  . busPIRone (BUSPAR) tablet 10 mg  10 mg Oral TID Dhungel, Nishant, MD   10 mg at 02/17/20 0904  . ceFAZolin (ANCEF) IVPB 2g/100 mL premix  2  g Intravenous Q8H Hart Robinsons A, RPH   Stopped at 02/17/20 1005  . fluticasone furoate-vilanterol (BREO ELLIPTA) 100-25 MCG/INH 1 puff  1 puff Inhalation Daily Rito Ehrlich A, RPH   1 puff at 02/17/20 0905   And  . umeclidinium bromide (INCRUSE ELLIPTA) 62.5 MCG/INH 1 puff  1 puff Inhalation Daily Rito Ehrlich A, RPH   1 puff at 02/17/20 0905  . gabapentin (NEURONTIN) tablet 600 mg  600 mg Oral TID Dhungel, Nishant, MD   600 mg at 02/17/20 0904  . hydroxychloroquine (PLAQUENIL) tablet 200 mg  200 mg Oral BID Dhungel, Nishant, MD   200 mg at 02/17/20 0910  . hydrOXYzine (ATARAX/VISTARIL) tablet 50 mg  50 mg Oral QHS Dhungel, Nishant, MD   50 mg at 02/16/20 2326  . hyoscyamine (LEVSIN) tablet 0.125 mg  0.125 mg Oral PRN Dhungel, Nishant, MD      . lipase/protease/amylase (CREON) capsule 36,000 Units  36,000 Units Oral TID AC Dhungel, Nishant, MD   36,000 Units at 02/17/20 1127  . ondansetron (ZOFRAN) tablet 4 mg  4 mg Oral Q6H PRN Dhungel, Nishant, MD       Or  . ondansetron (ZOFRAN) injection 4 mg  4 mg Intravenous Q6H PRN Dhungel, Nishant, MD   4 mg at 02/16/20 1919  . pantoprazole (PROTONIX) EC tablet 40 mg  40 mg Oral Daily Dhungel, Nishant, MD   40 mg at 02/17/20 0904  . propranolol ER (INDERAL LA) 24 hr  capsule 120 mg  120 mg Oral Daily Dhungel, Nishant, MD   120 mg at 02/17/20 0905  . sertraline (ZOLOFT) tablet 50 mg  50 mg Oral Daily Dhungel, Nishant, MD   50 mg at 02/17/20 0904  . traZODone (DESYREL) tablet 50 mg  50 mg Oral QHS Dhungel, Nishant, MD   50 mg at 02/16/20 2209   Current Outpatient Medications  Medication Sig Dispense Refill  . busPIRone (BUSPAR) 10 MG tablet Take 1 tablet (10 mg total) by mouth 3 (three) times daily. 270 tablet 0  . CREON 36000 units CPEP capsule Takes 2 capsules three times daily.    . cyclobenzaprine (FLEXERIL) 5 MG tablet Take 1 tablet (5 mg total) by mouth 3 (three) times daily as needed for muscle spasms. 30 tablet 1  . ELIQUIS 5 MG TABS tablet Take 1 tablet (5 mg total) by mouth 2 (two) times daily. 180 tablet 3  . esomeprazole (NEXIUM) 20 MG capsule TAKE 1 CAPSULE BY MOUTH EVERY DAY 90 capsule 1  . Fluticasone-Umeclidin-Vilant (TRELEGY ELLIPTA) 100-62.5-25 MCG/INH AEPB Inhale 1 puff into the lungs daily. 60 each 0  . gabapentin (NEURONTIN) 600 MG tablet TAKE 1 TABLET BY MOUTH THREE TIMES A DAY 90 tablet 1  . HUMIRA PEN 40 MG/0.4ML PNKT Inject 40 mg as directed. Every 2 weeks    . hydrochlorothiazide (HYDRODIURIL) 25 MG tablet Take 1 tablet (25 mg total) by mouth daily. 30 tablet 11  . hydroxychloroquine (PLAQUENIL) 200 MG tablet Take 200 mg by mouth 2 (two) times daily.     . hydrOXYzine (ATARAX/VISTARIL) 50 MG tablet Take 0.5-1 tablets (25-50 mg total) by mouth daily as needed for anxiety. Patient has supplies (Patient taking differently: Take 50 mg by mouth at bedtime. Can take an extra 50 mg if needed) 90 tablet 0  . hyoscyamine (LEVSIN) 0.125 MG tablet Take 0.125 mg by mouth as needed.    . propranolol ER (INDERAL LA) 120 MG 24 hr capsule Take 1 capsule (120 mg total) by mouth  daily. 90 capsule 3  . sertraline (ZOLOFT) 50 MG tablet TAKE 1 TABLET BY MOUTH EVERY DAY 90 tablet 0  . traZODone (DESYREL) 50 MG tablet Take 1 tablet (50 mg total) by mouth at  bedtime. 90 tablet 0     Abtx:  Anti-infectives (From admission, onward)   Start     Dose/Rate Route Frequency Ordered Stop   02/17/20 1600  vancomycin (VANCOREADY) IVPB 1250 mg/250 mL  Status:  Discontinued     1,250 mg 166.7 mL/hr over 90 Minutes Intravenous Every 24 hours 02/16/20 1745 02/17/20 0034   02/17/20 0100  ceFAZolin (ANCEF) IVPB 2g/100 mL premix     2 g 200 mL/hr over 30 Minutes Intravenous Every 8 hours 02/17/20 0035     02/16/20 2200  ceFEPIme (MAXIPIME) 2 g in sodium chloride 0.9 % 100 mL IVPB  Status:  Discontinued     2 g 200 mL/hr over 30 Minutes Intravenous Every 12 hours 02/16/20 1443 02/17/20 0033   02/16/20 1700  hydroxychloroquine (PLAQUENIL) tablet 200 mg     200 mg Oral 2 times daily 02/16/20 1407     02/16/20 1445  vancomycin (VANCOCIN) IVPB 1000 mg/200 mL premix     1,000 mg 200 mL/hr over 60 Minutes Intravenous  Once 02/16/20 1442 02/16/20 1710   02/16/20 1115  ceFEPIme (MAXIPIME) 2 g in sodium chloride 0.9 % 100 mL IVPB     2 g 200 mL/hr over 30 Minutes Intravenous  Once 02/16/20 1101 02/16/20 1146   02/16/20 1115  metroNIDAZOLE (FLAGYL) IVPB 500 mg     500 mg 100 mL/hr over 60 Minutes Intravenous  Once 02/16/20 1101 02/16/20 1254   02/16/20 1115  vancomycin (VANCOCIN) IVPB 1000 mg/200 mL premix     1,000 mg 200 mL/hr over 60 Minutes Intravenous  Once 02/16/20 1101 02/16/20 1415      REVIEW OF SYSTEMS: not reliable  Objective:  VITALS:  BP 102/64   Pulse (!) 113   Temp 99.7 F (37.6 C) (Oral)   Resp (!) 25   Ht 5\' 7"  (1.702 m)   Wt 88 kg   SpO2 100%   BMI 30.39 kg/m  PHYSICAL EXAM:  General: awake, responds to commands appropriately but unable to sustain her attention Oriented  in person  Head: Normocephalic, without obvious abnormality, atraumatic. Eyes: Conjunctivae clear, anicteric sclerae. Pupils are equal ENT Nares normal. No drainage or sinus tenderness. Lips, mucosa, and tongue very dry and coated Neck: Supple, symmetrical, no  adenopathy, thyroid: non tender no carotid bruit and no JVD. Back: tenderness around mid back. Lungs: b/l air entry Heart: irregular Abdomen: Soft, non-tender,not distended. Bowel sounds normal. No masses Extremities: atraumatic, no cyanosis. No edema. No clubbing Skin: No rashes or lesions. Or bruising Lymph: Cervical, supraclavicular normal. Neurologic: Grossly non-focal Pertinent Labs Lab Results CBC    Component Value Date/Time   WBC 6.7 02/17/2020 0507   RBC 3.99 02/17/2020 0507   HGB 9.7 (L) 02/17/2020 0507   HGB 9.4 (L) 01/04/2015 0521   HCT 32.3 (L) 02/17/2020 0507   HCT 30.5 (L) 01/04/2015 0521   PLT 120 (L) 02/17/2020 0507   PLT 176 01/04/2015 0521   MCV 81.0 02/17/2020 0507   MCV 72 (L) 01/04/2015 0521   MCH 24.3 (L) 02/17/2020 0507   MCHC 30.0 02/17/2020 0507   RDW 18.1 (H) 02/17/2020 0507   RDW 17.4 (H) 01/04/2015 0521   LYMPHSABS 0.4 (L) 02/16/2020 1052   LYMPHSABS 2.2 01/04/2015 0521   MONOABS 0.6  02/16/2020 1052   MONOABS 0.6 01/04/2015 0521   EOSABS 0.0 02/16/2020 1052   EOSABS 0.1 01/04/2015 0521   BASOSABS 0.0 02/16/2020 1052   BASOSABS 0.1 01/04/2015 0521    CMP Latest Ref Rng & Units 02/17/2020 02/16/2020 02/02/2020  Glucose 70 - 99 mg/dL 90 114(H) 105(H)  BUN 8 - 23 mg/dL 20 30(H) 20  Creatinine 0.44 - 1.00 mg/dL 0.79 1.13(H) 1.08(H)  Sodium 135 - 145 mmol/L 135 130(L) 136  Potassium 3.5 - 5.1 mmol/L 3.5 4.0 4.4  Chloride 98 - 111 mmol/L 106 98 100  CO2 22 - 32 mmol/L 22 20(L) 24  Calcium 8.9 - 10.3 mg/dL 7.7(L) 9.1 9.4  Total Protein 6.5 - 8.1 g/dL - 7.3 -  Total Bilirubin 0.3 - 1.2 mg/dL - 1.3(H) -  Alkaline Phos 38 - 126 U/L - 82 -  AST 15 - 41 U/L - 21 -  ALT 0 - 44 U/L - 9 -      Microbiology: Recent Results (from the past 240 hour(s))  Culture, blood (Routine x 2)     Status: None (Preliminary result)   Collection Time: 02/16/20 10:52 AM   Specimen: BLOOD  Result Value Ref Range Status   Specimen Description BLOOD RIGHT  ANTECUBITAL  Final   Special Requests   Final    BOTTLES DRAWN AEROBIC AND ANAEROBIC Blood Culture results may not be optimal due to an excessive volume of blood received in culture bottles   Culture  Setup Time   Final    GRAM POSITIVE COCCI IN BOTH AEROBIC AND ANAEROBIC BOTTLES CRITICAL RESULT CALLED TO, READ BACK BY AND VERIFIED WITH: Hart Robinsons PHARMD 0011 02/17/20 HNM Performed at [facility], [address], [place], [place] [address]    Culture GRAM POSITIVE COCCI  Final   Report Status PENDING  Incomplete  Culture, blood (Routine x 2)     Status: None (Preliminary result)   Collection Time: 02/16/20 10:52 AM   Specimen: BLOOD  Result Value Ref Range Status   Specimen Description BLOOD BLOOD RIGHT FOREARM  Final   Special Requests   Final    BOTTLES DRAWN AEROBIC AND ANAEROBIC Blood Culture adequate volume   Culture  Setup Time   Final    GRAM POSITIVE COCCI IN BOTH AEROBIC AND ANAEROBIC BOTTLES Organism ID to follow CRITICAL VALUE NOTED.  VALUE IS CONSISTENT WITH PREVIOUSLY REPORTED AND CALLED VALUE. Performed at [facility], [address] [place], [place] [address]    Culture [facility] POSITIVE COCCI  Final   Report Status PENDING  Incomplete  Blood Culture ID Panel (Reflexed)     Status: Abnormal   Collection Time: 02/16/20 10:52 AM  Result Value Ref Range Status   Enterococcus species NOT DETECTED NOT DETECTED Final   Listeria monocytogenes NOT DETECTED NOT DETECTED Final   Staphylococcus species DETECTED (A) NOT DETECTED Final    Comment: CRITICAL RESULT CALLED TO, READ BACK BY AND VERIFIED WITH: SCOTT HALL PHARMD 0011 02/17/20 HNM    Staphylococcus aureus (BCID) DETECTED (A) NOT DETECTED Final    Comment: Methicillin (oxacillin) susceptible Staphylococcus aureus (MSSA). Preferred therapy is anti staphylococcal beta lactam antibiotic (Cefazolin or Nafcillin), unless clinically contraindicated. CRITICAL RESULT CALLED TO, READ BACK BY AND VERIFIED  WITH: SCOTT HALL PHARMD 011 02/17/20 HNM    Methicillin resistance NOT DETECTED NOT DETECTED Final   Streptococcus species NOT DETECTED NOT DETECTED Final   Streptococcus agalactiae NOT DETECTED NOT DETECTED Final   Streptococcus pneumoniae NOT DETECTED  NOT DETECTED Final   Streptococcus pyogenes NOT DETECTED NOT DETECTED Final   Acinetobacter baumannii NOT DETECTED NOT DETECTED Final   Enterobacteriaceae species NOT DETECTED NOT DETECTED Final   Enterobacter cloacae complex NOT DETECTED NOT DETECTED Final   Escherichia coli NOT DETECTED NOT DETECTED Final   Klebsiella oxytoca NOT DETECTED NOT DETECTED Final   Klebsiella pneumoniae NOT DETECTED NOT DETECTED Final   Proteus species NOT DETECTED NOT DETECTED Final   Serratia marcescens NOT DETECTED NOT DETECTED Final   Haemophilus influenzae NOT DETECTED NOT DETECTED Final   Neisseria meningitidis NOT DETECTED NOT DETECTED Final   Pseudomonas aeruginosa NOT DETECTED NOT DETECTED Final   Candida albicans NOT DETECTED NOT DETECTED Final   Candida glabrata NOT DETECTED NOT DETECTED Final   Candida krusei NOT DETECTED NOT DETECTED Final   Candida parapsilosis NOT DETECTED NOT DETECTED Final   Candida tropicalis NOT DETECTED NOT DETECTED Final    Comment: Performed at [facility], [address] [place], [place] [address]  Urine culture     Status: Abnormal   Collection Time: 02/16/20 12:33 PM   Specimen: Urine, Clean Catch  Result Value Ref Range Status   Specimen Description   Final    URINE, CLEAN CATCH Performed at [facility], [address] [place], [place] [address]    Special Requests   Final    NONE Performed at [facility], [address] [place], [place] [address]    Culture MULTIPLE SPECIES PRESENT, SUGGEST RECOLLECTION (A)  Final   Report Status 02/17/2020 FINAL  Final  Respiratory Panel by RT PCR (Flu A&B, Covid) - Nasopharyngeal Swab     Status: None   Collection Time: 02/16/20  2:41  PM   Specimen: Nasopharyngeal Swab  Result Value Ref Range Status   SARS Coronavirus 2 by RT PCR NEGATIVE NEGATIVE Final    Comment: (NOTE) SARS-CoV-2 target nucleic acids are NOT DETECTED. The SARS-CoV-2 RNA is generally detectable in upper respiratoy specimens during the acute phase of infection. The lowest concentration of SARS-CoV-2 viral copies this assay can detect is 131 copies/mL. A negative result does not preclude SARS-Cov-2 infection and should not be used as the sole basis for treatment or other patient management decisions. A negative result may occur with  improper specimen collection/handling, submission of specimen other than nasopharyngeal swab, presence of viral mutation(s) within the areas targeted by this assay, and inadequate number of viral copies (<131 copies/mL). A negative result must be combined with clinical observations, patient history, and epidemiological information. The expected result is Negative. Fact Sheet for Patients:  PinkCheek.be Fact Sheet for Healthcare Providers:  GravelBags.it This test is not yet ap proved or cleared by the [place] FDA and  has been authorized for detection and/or diagnosis of SARS-CoV-2 by FDA under an Emergency Use Authorization (EUA). This EUA will remain  in effect (meaning this test can be used) for the duration of the COVID-19 declaration under Section 564(b)(1) of the Act, 21 U.S.C. section 360bbb-3(b)(1), unless the authorization is terminated or revoked sooner.    Influenza A by PCR NEGATIVE NEGATIVE Final   Influenza B by PCR NEGATIVE NEGATIVE Final    Comment: (NOTE) The Xpert Xpress SARS-CoV-2/FLU/RSV assay is intended as an aid in  the diagnosis of influenza from Nasopharyngeal swab specimens and  should not be used as a sole basis for treatment. Nasal washings and  aspirates are unacceptable for Xpert Xpress SARS-CoV-2/FLU/RSV  testing. Fact  Sheet for Patients: PinkCheek.be Fact  Sheet for Healthcare Providers: GravelBags.it This test is not yet approved or cleared by the [place] and  has been authorized for detection and/or diagnosis of SARS-CoV-2 by  FDA under an Emergency Use Authorization (EUA). This EUA will remain  in effect (meaning this test can be used) for the duration of the  Covid-19 declaration under Section 564(b)(1) of the Act, 21  U.S.C. section 360bbb-3(b)(1), unless the authorization is  terminated or revoked. Performed at [facility], [place], [place] [address]     IMAGING RESULTS: CXr Mild streaky atelectasis about the right minor fissure. There is no edema, consolidation, or pneumothorax. Blunting of the lateral right costophrenic sulcus. Cardiomegaly and aortic tortuosity. No edema, effusion, or pneumothorax.  I have personally reviewed the films ? Impression/Recommendation ? ?MSSA bacteremia with sepsis - unclear source- but because of back pain need to r/o discitis - need thoracolumbar MRI when stable Need 2 d echo and TEE likely Cefazolin 2 grams IV q8 Repeat blood culture 48 hrs  Encephalopathy secondary to sepsis  Rheumatoid arthritis on Humira and Plaquenil- Hold because of acute infection  Afib had ablation- but now irrgeular  Chronic diarrhea- has pancreatic insufficiency - followed by Dr.Toledo as OP  COPD  Anemia  Generalized anxiety/depression Management as per primary team   Discussed her management with Irma Newness her cousin and POA  Note:  This document was prepared using Systems analyst and may include unintentional dictation errors.

## 2020-02-17 NOTE — Plan of Care (Signed)
Admitted to floor from ED. Patient alert and oriented to self and place.  [place] 2 L oxygen .  Bed alarm initiated.  Oriented patient to call bell and room.

## 2020-02-17 NOTE — Progress Notes (Signed)
Initial Nutrition Assessment  DOCUMENTATION CODES:   Obesity unspecified  INTERVENTION:   Ensure Enlive po BID, each supplement provides 350 kcal and 20 grams of protein  NUTRITION DIAGNOSIS:   Increased nutrient needs related to acute illness as evidenced by increased estimated needs.  GOAL:   Patient will meet greater than or equal to 90% of their needs  MONITOR:   PO intake, Supplement acceptance, Labs, Weight trends, Skin, I & O's  REASON FOR ASSESSMENT:   Malnutrition Screening Tool    ASSESSMENT:   74 y.o. female, with history of A. fib on anticoagulation, chronic pancreatic insufficiency on creon, chronic back pain, COPD, rheumatoid arthritis on methotrexate and [place] and MDD who was brought with confusion and found to have bacteremia and sepsis    RD working remotely.  Pt with fairly good appetite and oral intake in hospital; pt ate 75% of her dinner tray. RD will add supplements to help pt meet her estimated needs. Per chart, pt down 8lbs(4%) over the past month; this is not significant.   Medications reviewed and include: creon, protonix, cefazolin  Labs reviewed: Hgb 9.7(L), Hct 32.3(L)  Unable to complete Nutrition-Focused physical exam at this time.   Diet Order:   Diet Order            Diet Heart Room service appropriate? Yes; Fluid consistency: Thin  Diet effective now             EDUCATION NEEDS:   No education needs have been identified at this time  Skin:  Skin Assessment: Reviewed RN Assessment(ecchymosis)  Last BM:  2/17  Height:   Ht Readings from Last 1 Encounters:  02/16/20 5\' 7"  (1.702 m)    Weight:   Wt Readings from Last 1 Encounters:  02/16/20 88 kg    Ideal Body Weight:  61.3 kg  BMI:  Body mass index is 30.39 kg/m.  Estimated Nutritional Needs:   Kcal:  1700-2000kcal/day  Protein:  85-100g/day  Fluid:  >1.5L/day  Koleen Distance MS, RD, LDN Contact information available in Amion

## 2020-02-17 NOTE — Progress Notes (Signed)
PHARMACY - PHYSICIAN COMMUNICATION CRITICAL VALUE ALERT - BLOOD CULTURE IDENTIFICATION (BCID)  Brandy Armstrong is an 74 y.o. female who presented to [facility] on 02/16/2020 with a chief complaint of confusion, sepsis.  Assessment:  Lab reports + BCx, 4 of 4 bottles w/ GPC, Staph aureus, MecA not detected  Name of physician (or Provider) Contacted: Jonny Ruiz, NP  Current antibiotics: Cefepime and Vancomycin  Changes to prescribed antibiotics recommended: change to Ancef 2gm IV q8hrs Recommendations accepted by provider  Results for orders placed or performed during the hospital encounter of 02/16/20  Blood Culture ID Panel (Reflexed) (Collected: 02/16/2020 10:52 AM)  Result Value Ref Range   Enterococcus species NOT DETECTED NOT DETECTED   Listeria monocytogenes NOT DETECTED NOT DETECTED   Staphylococcus species DETECTED (A) NOT DETECTED   Staphylococcus aureus (BCID) DETECTED (A) NOT DETECTED   Methicillin resistance NOT DETECTED NOT DETECTED   Streptococcus species NOT DETECTED NOT DETECTED   Streptococcus agalactiae NOT DETECTED NOT DETECTED   Streptococcus pneumoniae NOT DETECTED NOT DETECTED   Streptococcus pyogenes NOT DETECTED NOT DETECTED   Acinetobacter baumannii NOT DETECTED NOT DETECTED   Enterobacteriaceae species NOT DETECTED NOT DETECTED   Enterobacter cloacae complex NOT DETECTED NOT DETECTED   Escherichia coli NOT DETECTED NOT DETECTED   Klebsiella oxytoca NOT DETECTED NOT DETECTED   Klebsiella pneumoniae NOT DETECTED NOT DETECTED   Proteus species NOT DETECTED NOT DETECTED   Serratia marcescens NOT DETECTED NOT DETECTED   Haemophilus influenzae NOT DETECTED NOT DETECTED   Neisseria meningitidis NOT DETECTED NOT DETECTED   Pseudomonas aeruginosa NOT DETECTED NOT DETECTED   Candida albicans NOT DETECTED NOT DETECTED   Candida glabrata NOT DETECTED NOT DETECTED   Candida krusei NOT DETECTED NOT DETECTED   Candida parapsilosis NOT DETECTED NOT DETECTED   Candida  tropicalis NOT DETECTED NOT DETECTED    Hart Robinsons A 02/17/2020  12:20 AM

## 2020-02-17 NOTE — Progress Notes (Addendum)
PROGRESS NOTE  Brandy Armstrong [id number] DOB: 28-Mar-1946 DOA: 02/16/2020 PCP: [name], FNP  Brief History   74 year old woman multiple medical problems outlined below, presented with confusion for approximately 48 hours, subjective chills, right flank pain, fever, dysuria.  Admitted for sepsis secondary to UTI, acute pyelonephritis  A & P  Sepsis secondary to UTI, acute pyelonephritis --Blood pressure low 100s, mildly tachycardic.  Continue empiric antibiotic.  Follow-up culture data.  MSSA bacteremia 2/2 bottles --Changed to cefazolin --Check 2D echocardiogram --ID consultation for further recommendations  Acute hypoxic respiratory failure, PMH includes COPD --Unclear how much of this is acute, she might have some mild edema on the left side on her chest x-ray although this was read as negative by the radiologist.  Given her history of COPD, acute illness may have unmasked occult hypoxia.  Continue home inhaler --Wean oxygen as tolerated  Acute toxic metabolic encephalopathy.  Reportedly lives alone and able to perform ADLs. --Alert and mostly oriented, conversant and appropriate on exam.  Monitor clinically.  Hyponatremia --Resolved.  Probably secondary to dehydration.  This is supported by the elevated total bilirubin, BUN and creatinine of on admission which has subsequently resolved.  Acute thrombocytopenia --Likely secondary to infection, slightly lower today.  Daily CBC.  Not on heparin or Lovenox.  Chronic normocytic anemia, per oncology note iron deficiency, due to chronic blood loss complicated by chronic anticoagulation.  Appears to be close to baseline.  Followed by hematology as an outpatient. --No history of bleeding.  Follow-up CBC daily.  Atrial fibrillation on chronic anticoagulation --Mildly tachycardic.  Resume propranolol (on for tremors apparently) --Continue apixaban  Rheumatoid arthritis on methotrexate and Arentz. --Hold medications this  admission.  Depression --Continue trazodone, BuSpar, Zoloft  Pancreatic insufficiency, chronic --continue Creon   DVT prophylaxis: apixaban Code Status: Full Family Communication: updated cousin K. By telephone Disposition Plan: likely home when improved Patient has MSSA bacteremia, was admitted for sepsis, requires further IV antibiotics, echocardiogram and close monitoring.  Infectious disease consultation also pending. Agitation.   [name], MD  [facility] Direct contact: see [email or web address] (further directions at bottom of note if needed) 7PM-7AM contact night coverage as at bottom of note 02/17/2020, 10:05 AM  LOS: 1 day   Significant Hospital Events   . 2/17 admitted for sepsis, UTI, pyelonephritis   Consults:  . Infectious disease   Procedures:  .   Significant Diagnostic Tests:  . CT abdomen pelvis mild right basilar atelectasis   Micro Data:  . Influenza PCR and SARS-CoV-2 negative . Urinalysis grossly positive . 2/17 urine culture   Antimicrobials:  .   Interval History/Subjective  Feels better today.  Has some suprapubic abdominal pain.  No real back pain today reported.  No shortness of breath, nausea or vomiting.  Objective   Vitals:  Vitals:   02/17/20 0419 02/17/20 0613  BP: 134/87 (!) 145/95  Pulse:  97  Resp: (!) 22 (!) 23  Temp:    SpO2: 97% 98%    Exam:  Constitutional.  Appears calm, comfortable. Psychiatric.  Grossly normal mood and affect.  Speech fluent and appropriate.  Oriented to self, location, year but not month. Respiratory.  Clear to auscultation bilaterally.  No wheezes, rales or rhonchi.  Mild increased respiratory effort.  Tachypneic mid 20s. Cardiovascular.  Tachycardic, regular rhythm.  No murmur, rub or gallop.  No lower extremity edema. Abdomen.  Soft, nondistended, positive bowel sounds, mild suprapubic/left lower quadrant tenderness with palpation.  No CVA tenderness bilaterally.  Skin.  Appears grossly  unremarkable. Musculoskeletal.  Grossly unremarkable.  I have personally reviewed the following:   Today's Data  . BMP unremarkable . Platelets down, now 120.  Hemoglobin stable at 9.7.  No leukocytosis.  Scheduled Meds: . apixaban  5 mg Oral BID  . busPIRone  10 mg Oral TID  . fluticasone furoate-vilanterol  1 puff Inhalation Daily   And  . umeclidinium bromide  1 puff Inhalation Daily  . gabapentin  600 mg Oral TID  . hydroxychloroquine  200 mg Oral BID  . hydrOXYzine  50 mg Oral QHS  . lipase/protease/amylase  36,000 Units Oral TID AC  . pantoprazole  40 mg Oral Daily  . propranolol ER  120 mg Oral Daily  . sertraline  50 mg Oral Daily  . traZODone  50 mg Oral QHS   Continuous Infusions: . sodium chloride 100 mL/hr at 02/17/20 0552  .  ceFAZolin (ANCEF) IV 2 g (02/17/20 0908)    Principal Problem:   Sepsis ([place]) Active Problems:   Atrial fibrillation with RVR (HCC)   COPD (chronic obstructive pulmonary disease) (HCC)   Tremors of nervous system   Thoracic back pain   PAD (peripheral artery disease) (HCC)   Rheumatoid arthritis (HCC)   Iron deficiency anemia due to chronic blood loss   Obesity, Class II, BMI 35-39.9   MDD (major depressive disorder), recurrent episode, moderate ([place])   Acute lower UTI   MSSA bacteremia   Pyelonephritis   Acute hypoxemic respiratory failure ([place])   Acute encephalopathy   Thrombocytopenia ([place])   LOS: 1 day   How to contact the [facility] Attending or Consulting provider 7A - 7P or covering provider during after hours 7P -7A, for this patient?  1. Check the care team in [facility] and look for a) attending/consulting TRH provider listed and b) the [facility] team listed 2. Log into [email or web address] and use Trail's universal password to access. If you do not have the password, please contact the hospital operator. 3. Locate the [facility] provider you are looking for under [facility] and page to a number that you can be directly reached. 4. If you  still have difficulty reaching the provider, please page the [facility] (Director on Call) for the Hospitalists listed on amion for assistance.

## 2020-02-17 NOTE — Progress Notes (Signed)
Report given to [facility] RN. Patient is currently at MRI, will transport patient to room once patient returns.

## 2020-02-17 NOTE — Progress Notes (Signed)
Healthcare POA Irma Newness updated via telephone. Requested that she be updated daily.

## 2020-02-17 NOTE — Consult Note (Signed)
Pharmacy Antibiotic Note  Brandy Armstrong is a 74 y.o. female admitted on 02/16/2020 with sepsis & bacteremia.  Pharmacy has been consulted for Ancef dosing.   Plan:  D/C Vancomycin and Cefepime Start Ancef 2gm IV q8hrs  Will continue to monitor renal function and progress  Height: 5\' 7"  (170.2 cm) Weight: 194 lb 0.1 oz (88 kg) IBW/kg (Calculated) : 61.6  Temp (24hrs), Avg:99.9 F (37.7 C), Min:99.1 F (37.3 C), Max:100.4 F (38 C)  Recent Labs  Lab 02/16/20 1052  WBC 8.0  CREATININE 1.13*  LATICACIDVEN 1.1    Estimated Creatinine Clearance: 52 mL/min (A) (by C-G formula based on SCr of 1.13 mg/dL (H)).    Allergies  Allergen Reactions  . Latex     Itching Itching   Antimicrobials this admission: Vancomycin 2/17 >> 2/18 Cefepime 2/17 >> 2/18 Ancef 2/18 >>  Microbiology results: 2/17 BCx: lab reports 4 of 4 bottles + for GPC, Staph aureus, MecA not detected 2/17 UCx: pending   Thank you for allowing pharmacy to be a part of this patient's care.  Hart Robinsons A 02/17/2020 12:36 AM

## 2020-02-17 NOTE — Progress Notes (Signed)
*  PRELIMINARY RESULTS* Echocardiogram 2D Echocardiogram has been performed.  Brandy Armstrong 02/17/2020, 2:19 PM

## 2020-02-18 ENCOUNTER — Inpatient Hospital Stay: Payer: Medicare Other

## 2020-02-18 DIAGNOSIS — Z79899 Other long term (current) drug therapy: Secondary | ICD-10-CM

## 2020-02-18 DIAGNOSIS — A4101 Sepsis due to Methicillin susceptible Staphylococcus aureus: Principal | ICD-10-CM

## 2020-02-18 DIAGNOSIS — D649 Anemia, unspecified: Secondary | ICD-10-CM

## 2020-02-18 LAB — BASIC METABOLIC PANEL
Anion gap: 8 (ref 5–15)
BUN: 18 mg/dL (ref 8–23)
CO2: 24 mmol/L (ref 22–32)
Calcium: 8.5 mg/dL — ABNORMAL LOW (ref 8.9–10.3)
Chloride: 104 mmol/L (ref 98–111)
Creatinine, Ser: 0.8 mg/dL (ref 0.44–1.00)
GFR calc Af Amer: >60 mL/min (ref >60)
GFR calc non Af Amer: >60 mL/min (ref >60)
Glucose, Bld: 83 mg/dL (ref 70–99)
Potassium: 3.7 mmol/L (ref 3.5–5.1)
Sodium: 136 mmol/L (ref 135–145)

## 2020-02-18 LAB — CBC
HCT: 32.4 % — ABNORMAL LOW (ref 36.0–46.0)
Hemoglobin: 9.8 g/dL — ABNORMAL LOW (ref 12.0–15.0)
MCH: 24.3 pg — ABNORMAL LOW (ref 26.0–34.0)
MCHC: 30.2 g/dL (ref 30.0–36.0)
MCV: 80.2 fL (ref 80.0–100.0)
Platelets: 141 10*3/uL — ABNORMAL LOW (ref 150–400)
RBC: 4.04 MIL/uL (ref 3.87–5.11)
RDW: 18.2 % — ABNORMAL HIGH (ref 11.5–15.5)
WBC: 6 10*3/uL (ref 4.0–10.5)
nRBC: 0 % (ref 0.0–0.2)

## 2020-02-18 LAB — SEDIMENTATION RATE: Sed Rate: 58 mm/hr — ABNORMAL HIGH (ref 0–30)

## 2020-02-18 MED ORDER — TRAMADOL HCL 50 MG PO TABS: 50.0000 mg | ORAL_TABLET | Freq: Four times a day (QID) | ORAL | Status: DC | PRN

## 2020-02-18 MED ORDER — TRAMADOL HCL 50 MG PO TABS
50.0000 mg | ORAL_TABLET | Freq: Four times a day (QID) | ORAL | Status: DC | PRN
  Administered 2020-02-18 (×2): 50 mg via ORAL
  Filled 2020-02-18 (×2): qty 1

## 2020-02-18 MED ORDER — OXYCODONE HCL 5 MG PO TABS
5.0000 mg | ORAL_TABLET | Freq: Four times a day (QID) | ORAL | Status: DC | PRN
  Administered 2020-02-18 – 2020-02-19 (×2): 5 mg via ORAL
  Filled 2020-02-18 (×2): qty 1

## 2020-02-18 NOTE — Evaluation (Signed)
Occupational Therapy Evaluation Patient Details Name: Brandy Armstrong MRN: [id number] DOB: Jun 18, 1946 Today's Date: 02/18/2020    History of Present Illness 74 y.o. female, with history of A. fib on anticoagulation, chronic pancreatic insufficiency, chronic back pain COPD, rheumatoid arthritis here with AMS/confusion.   Clinical Impression   Patient seen this afternoon for occupational therapy evaluation.  Patient states she is in 8/10 pain, however, demonstrates no grimacing or pain associated behaviors.  Patient refused to sit at EOB or to get out of bed, stating she was just "not feeling it today".  Educated patient on benefits of participation and general goals of OT.  Patient verbalized understanding but continued to refuse to leave bed.  Educated patient further on LTRs while in bed to reduce low back pain; patient verbalized understanding.  Educated patient on how to position self in bed and pull self up in bed requiring MOD A to complete task.  B UEs are very weak and patient would benefit from exercise program.  Patient would benefit from skilled OT to address several performance components, including: ADL retraining, activity tolerance, strength, functional transfers, safety awareness, and balance.  Based on today's performance, patient would benefit from SNF at discharge.  Patient adamantly refusing to go to rehab.  Provided further education on benefits of attending therapy at discharge due to general weakness and debilitation.        Follow Up Recommendations  SNF(Patient states she will "refuse to go to rehab".)    Equipment Recommendations  Other (comment)(defer to next level of care)    Recommendations for Other Services       Precautions / Restrictions Precautions Precautions: Fall Restrictions Weight Bearing Restrictions: No      Mobility Bed Mobility Overal bed mobility: Needs Assistance Bed Mobility: Rolling Rolling: Independent         General bed mobility  comments: Patient refused to leave bed but agreed to reposition self.  Required MOD A to scoot up in bed.  Transfers                 General transfer comment: Patient refused to stand    Balance                                           ADL either performed or assessed with clinical judgement   ADL Overall ADL's : Needs assistance/impaired                     Lower Body Dressing: Maximal assistance;Sitting/lateral leans Lower Body Dressing Details (indicate cue type and reason): Poor motivation   Toilet Transfer Details (indicate cue type and reason): Refused to transfer   Toileting - Clothing Manipulation Details (indicate cue type and reason): Unable to assess       General ADL Comments: Patient demonstrates low motivation to participate in self care tasks independently     Vision Baseline Vision/History: Wears glasses       Perception     Praxis      Pertinent Vitals/Pain Pain Score: 8  Pain Location: Patient states pain is in low back and abdomen     Hand Dominance Right   Extremity/Trunk Assessment Upper Extremity Assessment Upper Extremity Assessment: Generalized weakness   Lower Extremity Assessment Lower Extremity Assessment: Defer to PT evaluation       Communication Communication Communication: No difficulties   Cognition Arousal/Alertness:  Awake/alert Behavior During Therapy: WFL for tasks assessed/performed Overall Cognitive Status: Within Functional Limits for tasks assessed                                 General Comments: Low motivation to participate   General Comments       Exercises Other Exercises Other Exercises: Provided education on ways to position self in bed to reduce pain Other Exercises: Provided education on benefits and goals of OT Other Exercises: Educated on importance of sitting in recliner throughout day to improve activity tolerance Other Exercises: Educated patient in  LTRs to reduce back pain   Shoulder Instructions      Home Living Family/patient expects to be discharged to:: Private residence Living Arrangements: Alone(1 dog) Available Help at Discharge: Family;Available PRN/intermittently Type of Home: Apartment Home Access: Level entry     Home Layout: One level     Bathroom Shower/Tub: Walk-in shower         Home Equipment: Environmental consultant - 4 wheels;Walker - 2 wheels;Grab bars - tub/shower;Shower seat          Prior Functioning/Environment Level of Independence: Independent with assistive device(s)        Comments: patient states she completes all (B/I)ADLs        OT Problem List: Decreased strength;Decreased activity tolerance;Pain      OT Treatment/Interventions: Self-care/ADL training;Therapeutic exercise;Energy conservation;Therapeutic activities;Patient/family education    OT Goals(Current goals can be found in the care plan section) Acute Rehab OT Goals Patient Stated Goal: "I just want to go home". OT Goal Formulation: With patient Time For Goal Achievement: 03/02/20 Potential to Achieve Goals: Fair(Poor motivation to participate)  OT Frequency: Min 2X/week   Barriers to D/C:            Co-evaluation              AM-PAC OT "6 Clicks" Daily Activity     Outcome Measure Help from another person eating meals?: None Help from another person taking care of personal grooming?: None Help from another person toileting, which includes using toliet, bedpan, or urinal?: A Lot Help from another person bathing (including washing, rinsing, drying)?: A Lot Help from another person to put on and taking off regular upper body clothing?: A Little Help from another person to put on and taking off regular lower body clothing?: A Lot 6 Click Score: 17   End of Session    Activity Tolerance: Patient limited by lethargy;Patient limited by pain;Other (comment)(Patient demonstrated limited motivation to participate) Patient left:  in bed;with call bell/phone within reach;with bed alarm set  OT Visit Diagnosis: Muscle weakness (generalized) (M62.81)                Time: 1428-1500 OT Time Calculation (min): 32 min Charges:  OT General Charges $OT Visit: 1 Visit OT Evaluation $OT Eval Low Complexity: 1 Low OT Treatments $Therapeutic Activity: 23-37 mins  Baldomero Lamy, MS, OTR/L 02/18/20, 3:34 PM

## 2020-02-18 NOTE — Progress Notes (Addendum)
PROGRESS NOTE  Brandy Armstrong [id number] DOB: 1946-05-14 DOA: 02/16/2020 PCP: [name], FNP  Brief History   74 year old woman multiple medical problems outlined below, presented with confusion for approximately 48 hours, subjective chills, right flank pain, fever, dysuria.  Admitted for sepsis secondary to UTI, acute pyelonephritis  A & P  Sepsis secondary to MSSA bacteremia, possible UTI, possible acute pyelonephritis --Sepsis appears resolved, urine culture was unrevealing.  Continue cefazolin as below.  MSSA bacteremia 2/2 bottles, source unclear.  2D echocardiogram reassuring.  MRI thoracic and lumbar spine did not show any clear evidence of infection. --Afebrile, hemodynamic stable.  Mentation improved.  Continue cefazolin --Appreciate ID recommendations. --Repeat blood cultures in 24 hours.  Back pain --Probably degenerative disease as seen on imaging of MRI and thoracic spine. --PT, OT evaluations  Acute hypoxic respiratory failure, PMH includes COPD --Given her history of COPD, acute illness may have unmasked occult hypoxia.  Continue home inhaler --Wean oxygen as tolerated  Acute thrombocytopenia --Likely secondary to infection, now trending back up  Chronic normocytic anemia, per oncology note iron deficiency, due to chronic blood loss complicated by chronic anticoagulation.  Appears to be close to baseline.  Followed by hematology as an outpatient. --No history of bleeding.  Hemoglobin stable.  No further inpatient evaluation suggested.  Atrial fibrillation on chronic anticoagulation --Intermittent mild tachycardia.  Continue propranolol (on for tremors apparently) --Continue apixaban  Rheumatoid arthritis on methotrexate and Arentz. --Hold medications this admission.  Depression --Continue trazodone, BuSpar, Zoloft  Pancreatic insufficiency, chronic --Continue Creon   Obesity unspecified --Continue Ensure per dietitian  Disposition Plan: likely  home when improved Continue treatment for MSSA bacteremia of unclear source.  ID following.  Will need repeat blood cultures in 24 hours, may need TEE.  Not ready for discharge.  Resolved issues Hyponatremia --Resolved.  Probably secondary to dehydration.  This is supported by the elevated total bilirubin, BUN and creatinine of on admission which has subsequently resolved.  Acute toxic metabolic encephalopathy.  Reportedly lives alone and able to perform ADLs. --Appears resolved at this point.  She is alert and oriented.  Disposition Plan: From home Will need SNF (currently refusing) Still investigating source of MSSA bacteremia. MRI inconclusive, and echo unrevealing. Will need TEE Monday, ongoing treatment of bacteremia.   DVT prophylaxis: apixaban Code Status: Full Family Communication: updated cousin/POA by telephone    [name], MD  [facility] Direct contact: see [email or web address] (further directions at bottom of note if needed) 7PM-7AM contact night coverage as at bottom of note 02/18/2020, 12:06 PM  LOS: 2 days   Significant Hospital Events   . 2/17 admitted for sepsis, UTI, pyelonephritis.  Bacteremia noted.   Consults:  . Infectious disease   Procedures:  .   Significant Diagnostic Tests:  . CT abdomen pelvis mild right basilar atelectasis . 2D echocardiogram LVEF 60-65%.  Normal LV function.  Normal systolic RV function.  No valve vegetation noted. . 2/18 MRI thoracic spine.  Motion degradation.  No findings of discitis or osteomyelitis.  Mild thoracic spondylosis.  Small T5-T6 right central disc protrusion contacts the ventral spinal cord.  No central canal stenosis. . 2/18 MRI lumbar spine motion degradation.  Mild endplate edema L3-L4 may be degenerative.  Patchy edema and enhancement lumbar posterior elements L2-L3 and L3-L4.  Significant facet arthrosis may be degenerative.  Infection cannot be definitively excluded but no other findings.  L3-L4  spondylosis has slightly progressed.   Micro Data:  . Influenza PCR and SARS-CoV-2  negative . Urinalysis grossly positive . 2/17 urine culture   Antimicrobials:  .   Interval History/Subjective  Reports back pain, this has been present for a few weeks perhaps.  Urine feels hot when urinating.  Denies dysuria.  Objective   Vitals:  Vitals:   02/18/20 0340 02/18/20 0844  BP: 118/82   Pulse: 95 (!) 106  Resp: 20   Temp: 97.9 F (36.6 C)   SpO2: 99%     Exam:  Constitutional.  Appears calm, comfortable.  More alert today. Psychiatric.  Grossly normal mood and affect.  Speech fluent and appropriate.  Oriented to self, location, month, year. Respiratory.  Clear to auscultation bilaterally.  No wheezes, rales or rhonchi.  Normal respiratory effort. Cardiovascular.  Regular rate and rhythm.  No lower extremity edema. Abdomen.  Soft.  I have personally reviewed the following:   Today's Data  . MRIs reviewed . Echocardiogram reviewed . ESR 58.  BMP unremarkable.  Platelets increased to 141.  Hemoglobin stable at 9.8  Scheduled Meds: . apixaban  5 mg Oral BID  . busPIRone  10 mg Oral TID  . feeding supplement (ENSURE ENLIVE)  237 mL Oral BID BM  . fluticasone furoate-vilanterol  1 puff Inhalation Daily   And  . umeclidinium bromide  1 puff Inhalation Daily  . gabapentin  600 mg Oral TID  . hydroxychloroquine  200 mg Oral BID  . hydrOXYzine  50 mg Oral QHS  . lipase/protease/amylase  36,000 Units Oral TID AC  . pantoprazole  40 mg Oral Daily  . propranolol ER  120 mg Oral Daily  . sertraline  50 mg Oral Daily  . traZODone  50 mg Oral QHS   Continuous Infusions: .  ceFAZolin (ANCEF) IV 200 mL/hr at 02/18/20 0900    Principal Problem:   Sepsis ([place]) Active Problems:   Atrial fibrillation with RVR (HCC)   COPD (chronic obstructive pulmonary disease) (HCC)   Tremors of nervous system   Thoracic back pain   PAD (peripheral artery disease) (HCC)   Rheumatoid  arthritis (HCC)   Iron deficiency anemia due to chronic blood loss   Obesity, Class II, BMI 35-39.9   MDD (major depressive disorder), recurrent episode, moderate ([place])   Acute lower UTI   MSSA bacteremia   Pyelonephritis   Acute hypoxemic respiratory failure ([place])   Acute encephalopathy   Thrombocytopenia ([place])   LOS: 2 days   How to contact the [facility] Attending or Consulting provider 7A - 7P or covering provider during after hours 7P -7A, for this patient?  1. Check the care team in [facility] and look for a) attending/consulting TRH provider listed and b) the [facility] team listed 2. Log into [email or web address] and use 's universal password to access. If you do not have the password, please contact the hospital operator. 3. Locate the [facility] provider you are looking for under [facility] and page to a number that you can be directly reached. 4. If you still have difficulty reaching the provider, please page the [facility] (Director on Call) for the Hospitalists listed on amion for assistance.

## 2020-02-18 NOTE — Consult Note (Signed)
Pharmacy Antibiotic Note  Brandy Armstrong is a 74 y.o. female admitted on 02/16/2020 with sepsis & bacteremia.  Pharmacy has been consulted for Ancef dosing.   BCID: MSSA. Source unknown.   Plan:  Continue  Cefazolin  2gm IV q8hrs  Will continue to monitor renal function and progress  Height: 5\' 7"  (170.2 cm) Weight: 194 lb 0.1 oz (88 kg) IBW/kg (Calculated) : 61.6  Temp (24hrs), Avg:98.9 F (37.2 C), Min:97.9 F (36.6 C), Max:99.6 F (37.6 C)  Recent Labs  Lab 02/16/20 1052 02/17/20 0507 02/18/20 0250  WBC 8.0 6.7 6.0  CREATININE 1.13* 0.79 0.80  LATICACIDVEN 1.1  --   --     Estimated Creatinine Clearance: 73.5 mL/min (by C-G formula based on SCr of 0.8 mg/dL).    Allergies  Allergen Reactions  . Latex     Itching Itching   Antimicrobials this admission: Vancomycin 2/17 >> 2/18 Cefepime 2/17 >> 2/18 Cefazolin  2/18 >>  Microbiology results: 2/17 BCx: lab reports 4 of 4 bottles + for GPC, Staph aureus, MecA not detected BCID: MSSA  2/17 UCx: pending   Thank you for allowing pharmacy to be a part of this patient's care.  Pernell Dupre, PharmD, BCPS Clinical Pharmacist 02/18/2020 7:41 AM

## 2020-02-18 NOTE — Progress Notes (Signed)
Date of Admission:  02/16/2020   T   ID: Brandy Armstrong is a 74 y.o. female  Principal Problem:   Sepsis ([place]) Active Problems:   Atrial fibrillation with RVR (HCC)   COPD (chronic obstructive pulmonary disease) (HCC)   Tremors of nervous system   Thoracic back pain   PAD (peripheral artery disease) (HCC)   Rheumatoid arthritis (HCC)   Iron deficiency anemia due to chronic blood loss   Obesity, Class II, BMI 35-39.9   MDD (major depressive disorder), recurrent episode, moderate (HCC)   Acute lower UTI   MSSA bacteremia   Pyelonephritis   Acute hypoxemic respiratory failure (HCC)   Acute encephalopathy   Thrombocytopenia (HCC)    Subjective: Has back pain Unable to get comfortable  Medications:  . apixaban  5 mg Oral BID  . busPIRone  10 mg Oral TID  . feeding supplement (ENSURE ENLIVE)  237 mL Oral BID BM  . fluticasone furoate-vilanterol  1 puff Inhalation Daily   And  . umeclidinium bromide  1 puff Inhalation Daily  . gabapentin  600 mg Oral TID  . hydroxychloroquine  200 mg Oral BID  . hydrOXYzine  50 mg Oral QHS  . lipase/protease/amylase  36,000 Units Oral TID AC  . pantoprazole  40 mg Oral Daily  . propranolol ER  120 mg Oral Daily  . sertraline  50 mg Oral Daily  . traZODone  50 mg Oral QHS    Objective: Awake and alert Chest b/l air entry HS s1s And soft Cns -non focal   Patient Vitals for the past 24 hrs:  BP Temp Temp src Pulse Resp SpO2  02/18/20 0844 -- -- -- (!) 106 -- --  02/18/20 0340 118/82 97.9 F (36.6 C) Oral 95 20 99 %  02/17/20 2245 -- -- -- 94 18 --  02/17/20 2053 -- 98.3 F (36.8 C) Oral -- 20 --  02/17/20 1933 131/83 99.6 F (37.6 C) Axillary (!) 113 (!) 24 96 %  02/17/20 1459 139/84 99.6 F (37.6 C) Oral 95 20 96 %  02/17/20 1200 127/84 -- -- -- (!) 24 --  02/17/20 1030 -- -- -- (!) 113 (!) 25 100 %     Lab Results Recent Labs    02/17/20 0507 02/18/20 0250  WBC 6.7 6.0  HGB 9.7* 9.8*  HCT 32.3* 32.4*  NA 135  136  K 3.5 3.7  CL 106 104  CO2 22 24  BUN 20 18  CREATININE 0.79 0.80   Liver Panel Recent Labs    02/16/20 1052  PROT 7.3  ALBUMIN 3.3*  AST 21  ALT 9  ALKPHOS 82  BILITOT 1.3*   Sedimentation Rate No results for input(s): ESRSEDRATE in the last 72 hours. C-Reactive Protein No results for input(s): CRP in the last 72 hours.  Microbiology:  Studies/Results: MR THORACIC SPINE W WO CONTRAST  Result Date: 02/17/2020 CLINICAL DATA:  Back pain, MSSA bacteremia, assess for vertebral osteomyelitis, discitis; sepsis. EXAM: MRI THORACIC WITHOUT AND WITH CONTRAST TECHNIQUE: Multiplanar and multiecho pulse sequences of the thoracic spine were obtained without and with intravenous contrast. CONTRAST:  7.40mL GADAVIST GADOBUTROL 1 MMOL/ML IV SOLN COMPARISON:  CT angiogram chest 05/11/2014 FINDINGS: MRI THORACIC SPINE FINDINGS Multiple sequences are significantly motion degraded, limiting evaluation. Most notably, there is moderate to moderately severe motion degradation of the sagittal and axial T1 weighted postcontrast imaging and moderate motion degradation of the axial T2 weighted imaging. Alignment:  Normal Vertebrae: Vertebral body height  is maintained. Within described limitations, no marrow edema or abnormal osseous enhancement is demonstrated. Cord: Within described limitations, no spinal cord signal abnormality is identified. Paraspinal and other soft tissues: T2 hyperintense signal abnormality within the dependent aspect of the mid to lower lungs is nonspecific. No paraspinal soft tissue abnormality is identified. Disc levels: Mild disc degeneration throughout the thoracic spine. At T1-T2, small central disc protrusion without contact upon the spinal cord or significant canal stenosis. At T5-T6, there is a small right center disc protrusion which contacts the right ventral spinal cord. No significant central canal stenosis at this level. At T7-T8, tiny right center disc protrusion without  canal stenosis. No significant focal disc herniation or spinal canal stenosis at the remaining levels. No significant foraminal stenosis within the thoracic spine. Multilevel ventrolateral osteophytes within the lower thoracic spine. IMPRESSION: 1. Examination limited by significant motion degradation. 2. No appreciable findings of discitis/osteomyelitis within the thoracic spine. No paraspinal soft tissue abnormality is identified. 3. Mild thoracic spondylosis. Most notably, a small T5-T6 right center disc protrusion contacts the ventral spinal cord. No significant central canal stenosis or neural foraminal narrowing at any level. 4. T2 hyperintense signal abnormality within the dependent aspect of the mid to lower lungs is nonspecific, but may reflect atelectasis. Consider a repeat chest radiograph if the patient has respiratory symptoms. Electronically Signed   By: Kellie Simmering DO   On: 02/17/2020 14:42   MR Lumbar Spine W Wo Contrast  Result Date: 02/17/2020 CLINICAL DATA:  Back pain, MSSA bacteremia, assess for vertebral osteomyelitis, discitis; sepsis. EXAM: MRI LUMBAR SPINE WITHOUT AND WITH CONTRAST TECHNIQUE: Multiplanar and multiecho pulse sequences of the lumbar spine were obtained without and with intravenous contrast. CONTRAST:  7.36mL GADAVIST GADOBUTROL 1 MMOL/ML IV SOLN COMPARISON:  Lumbar spine MRI 06/10/2019 FINDINGS: Multiple sequences are significantly motion degraded, limiting evaluation. Most notably there is moderate to moderately severe motion degradation of the sagittal and axial postcontrast T1 weighted imaging. Segmentation:  5 lumbar vertebrae. Alignment: Lumbar dextrocurvature. 2 mm L1-L2 grade 1 retrolisthesis. Trace L3-L4 retrolisthesis. Trace L4-L5 anterolisthesis. Vertebrae: Vertebral body height is maintained. There is mild endplate edema at [id number]. There is also patchy edema and enhancement within the posterior elements. Most notably, there is edema and somewhat prominent  enhancement within the articular pillars along the L2-L3 and L3-L4 right facet joints (for instance as seen on series 21, image 6). Conus medullaris and cauda equina: Conus extends to the L1-L2 level. No signal abnormality within the visualized distal spinal cord. Within described limitations, no definite abnormal enhancement identified along the visualized distal spinal cord or cauda equina nerve roots. Paraspinal and other soft tissues: No abnormality identified within included portions of the abdomen/retroperitoneum. Atrophy of the lumbar paraspinal musculature. No other definite paraspinal soft tissue abnormality identified. Disc levels: Unless otherwise stated, the level by level findings below have not significantly changed since prior MRI 06/10/2019. Moderate L1-L2 disc degeneration. Mild disc degeneration at the remaining levels. T12-L1: No disc herniation. No significant canal or foraminal stenosis. L1-L2: Mild grade 1 retrolisthesis. Small disc bulge. No significant spinal canal or neural foraminal narrowing. L2-L3: Mild facet arthrosis. No disc herniation. No significant canal or foraminal stenosis. L3-L4: Trace retrolisthesis. Disc bulge. The disc bulge is slightly more prominent within the right subarticular zone. Mild facet arthrosis/ligamentum flavum hypertrophy. New from prior exam, there is mild right subarticular narrowing with slight crowding of the descending right L4 nerve root. Central canal patent. Mild left neural foraminal narrowing, progressed. L4-L5:  Trace anterolisthesis. Disc uncovering with superimposed small central disc protrusion. Moderate facet arthrosis with mild ligamentum flavum hypertrophy. Trace fluid within the bilateral facet joints. Mild bilateral subarticular and central canal narrowing without frank nerve root impingement. No significant foraminal stenosis. L5-S1: Small central disc protrusion. Moderate facet arthrosis. No significant spinal canal stenosis or neural  foraminal narrowing. IMPRESSION: 1. Examination limited by significant motion degradation. 2. Mild endplate edema and enhancement at L3-L4 may be degenerative. 3. Patchy edema and enhancement within the lumbar posterior elements. Most notably there is edema and somewhat prominent enhancement within the articular pillars along the right L2-L3 and L3-L4 facet joints. There is significant facet arthrosis at these levels and findings may be degenerative. Infection cannot be definitively excluded, although there are no other findings at this level to suggest this on the current exam (i.e. sizable facet joint effusion). 4. L3-L4 spondylosis has slightly progressed from 06/10/2019. There is now mild right subarticular stenosis at this level with slight crowding of the descending right L4 nerve root. Mild left neural foraminal narrowing at this level has also progressed. 5. Lumbar spondylosis is otherwise unchanged. Electronically Signed   By: Kellie Simmering DO   On: 02/17/2020 15:16   CT ABDOMEN PELVIS W CONTRAST  Result Date: 02/16/2020 CLINICAL DATA:  Nausea and vomiting EXAM: CT ABDOMEN AND PELVIS WITH CONTRAST TECHNIQUE: Multidetector CT imaging of the abdomen and pelvis was performed using the standard protocol following bolus administration of intravenous contrast. CONTRAST:  176mL OMNIPAQUE IOHEXOL 300 MG/ML  SOLN COMPARISON:  02/19/2019 FINDINGS: Lower chest: Mild right basilar atelectasis is noted. No sizable effusion is noted. Hepatobiliary: No focal liver abnormality is seen. Status post cholecystectomy. No biliary dilatation. Pancreas: Unremarkable. No pancreatic ductal dilatation or surrounding inflammatory changes. Spleen: Normal in size without focal abnormality. Adrenals/Urinary Tract: Adrenal glands are within normal limits. Normal enhancement of the kidneys is seen bilaterally. Normal excretion is noted bilaterally. No obstructive changes are seen. The bladder is well distended. Stomach/Bowel:  Diverticular change of the colon is noted without evidence of diverticulitis. The appendix is not well visualized consistent with the known surgical history. No small bowel obstructive changes are seen. The stomach is within normal limits. Vascular/Lymphatic: Mild ectasia of the infrarenal aorta to 2.9 cm is noted. Tortuosity is seen as well as diffuse atherosclerotic calcifications. No lymphadenopathy is seen. Reproductive: Status post hysterectomy. No adnexal masses. Other: No abdominal wall hernia or abnormality. No abdominopelvic ascites. Musculoskeletal: No acute or significant osseous findings. IMPRESSION: Mild right basilar atelectasis without sizable effusion. Diverticulosis without diverticulitis. Mild ectasia of the infrarenal aorta. Electronically Signed   By: Inez Catalina M.D.   On: 02/16/2020 12:28   DG Chest Portable 1 View  Result Date: 02/16/2020 CLINICAL DATA:  Fever and altered mental status. EXAM: PORTABLE CHEST 1 VIEW COMPARISON:  07/09/2019 FINDINGS: Mild streaky atelectasis about the right minor fissure. There is no edema, consolidation, or pneumothorax. Blunting of the lateral right costophrenic sulcus. Cardiomegaly and aortic tortuosity. No edema, effusion, or pneumothorax. IMPRESSION: Minimal atelectasis and pleural fluid on the right. Electronically Signed   By: Monte Fantasia M.D.   On: 02/16/2020 11:12   ECHOCARDIOGRAM COMPLETE  Result Date: 02/17/2020    ECHOCARDIOGRAM REPORT   Patient Name:   NARAYA HACKERT Date of Exam: 02/17/2020 Medical Rec #:  [id number]     Height:       67.0 in Accession #:    [id number]    Weight:       194.0  lb Date of Birth:  1946-02-03      BSA:          2.00 m Patient Age:    64 years      BP:           127/84 mmHg Patient Gender: F             HR:           107 bpm. Exam Location:  [facility] Procedure: 2D Echo, Cardiac Doppler and Color Doppler Indications:     bactermia 790.7  History:         Patient has prior history of Echocardiogram examinations, most                   recent 02/20/2019. COPD; Arrythmias:Atrial Fibrillation. Chest                  pain.  Sonographer:     Sherrie Sport RDCS (AE) Referring Phys:  [place] Diagnosing Phys: Ida Rogue MD  Sonographer Comments: Technically difficult study due to poor echo windows, no apical window and no subcostal window. IMPRESSIONS  1. Left ventricular ejection fraction, by estimation, is 60 to 65%. The left ventricle has normal function. The left ventricle has no regional wall motion abnormalities. Left ventricular diastolic parameters are indeterminate.  2. Right ventricular systolic function is normal. The right ventricular size is normal.  3. No valve vegetation noted FINDINGS  Left Ventricle: Left ventricular ejection fraction, by estimation, is 60 to 65%. The left ventricle has normal function. The left ventricle has no regional wall motion abnormalities. The left ventricular internal cavity size was normal in size. There is  no left ventricular hypertrophy. Left ventricular diastolic parameters are indeterminate. Right Ventricle: The right ventricular size is normal. No increase in right ventricular wall thickness. Right ventricular systolic function is normal. Left Atrium: Left atrial size was normal in size. Right Atrium: Right atrial size was normal in size. Pericardium: There is no evidence of pericardial effusion. Mitral Valve: The mitral valve is normal in structure and function. Normal mobility of the mitral valve leaflets. No evidence of mitral valve regurgitation. No evidence of mitral valve stenosis. Tricuspid Valve: The tricuspid valve is normal in structure. Tricuspid valve regurgitation is not demonstrated. No evidence of tricuspid stenosis. Aortic Valve: The aortic valve is normal in structure and function. Aortic valve regurgitation is not visualized. Mild to moderate aortic valve sclerosis/calcification is present, without any evidence of aortic stenosis. Pulmonic Valve: The  pulmonic valve was normal in structure. Pulmonic valve regurgitation is not visualized. No evidence of pulmonic stenosis. Aorta: The aortic root is normal in size and structure. Venous: The inferior vena cava is normal in size with greater than 50% respiratory variability, suggesting right atrial pressure of 3 mmHg. IAS/Shunts: No atrial level shunt detected by color flow Doppler.  LEFT VENTRICLE PLAX 2D LVIDd:         4.31 cm LVIDs:         3.06 cm LV PW:         1.19 cm LV IVS:        1.49 cm LVOT diam:     2.00 cm LV SV Index:   22.65 LVOT Area:     3.14 cm  LEFT ATRIUM         Index LA diam:    3.80 cm 1.90 cm/m  PULMONIC VALVE AORTA                 RVOT Peak grad: 2 mmHg Ao Root diam: 3.00 cm   SHUNTS Systemic Diam: 2.00 cm Ida Rogue MD Electronically signed by Ida Rogue MD Signature Date/Time: 02/17/2020/3:57:33 PM    Final      Assessment/Plan: MSSA bacteremia with sepsis - unclear source- but because of back pain MRI thoracic/lumbar spine done and no discitis or collectopn Need TEE ( as 2 d echo did not show any vegetations)  Cefazolin 2 grams IV q8 Repeat blood culture 48 hrs  Encephalopathy secondary to sepsis  Rheumatoid arthritis on Humira and Plaquenil- Hold because of acute infection  Afib had ablation- but now irrgeular  Chronic diarrhea- has pancreatic insufficiency - followed by Dr.Toledo as OP  COPD  Anemia  Generalized anxiety/depression Management as per primary team  Discussed the management with the patient- ID will follow her peripherally this weekend   Discussed her management with Irma Newness her cousin and POA

## 2020-02-18 NOTE — Evaluation (Signed)
Physical Therapy Evaluation Patient Details Name: Brandy Armstrong MRN: [id number] DOB: 22-Dec-1946 Today's Date: 02/18/2020   History of Present Illness  74 y.o. female, with history of A. fib on anticoagulation, chronic pancreatic insufficiency, chronic back pain COPD, rheumatoid arthritis here with AMS/confusion.  Clinical Impression  Pt c/o back pain on arrival and initially did not want to do much with PT but agreed to try to get to the recliner to change position.  However she was unable to rise to standing even with heavy assist and then complained of too much pain to be willing to try to get to standing again.  She needed some assist to get to sitting EOB but was able to maintain sitting balance w/ only c/o pain but no safety issue. Pt adamant that she does not want to go to rehab but per today's performance she would not be able to manage at home.  Needs to show some ability to do mobility/out of bed tasks for this to be a safe proposition, recommending STR at this time.     Follow Up Recommendations SNF(pt reports she does not want to go to rehab)    Equipment Recommendations  Rolling walker with 5" wheels    Recommendations for Other Services       Precautions / Restrictions Precautions Precautions: Fall Restrictions Weight Bearing Restrictions: No      Mobility  Bed Mobility Overal bed mobility: Needs Assistance Bed Mobility: Supine to Sit;Sit to Supine     Supine to sit: Min assist Sit to supine: Min assist   General bed mobility comments: Pt showed some effort but unable to get to sitting w/o some assist, needed light assist to get LEs back into bed as well  Transfers Overall transfer level: Needs assistance Equipment used: Rolling walker (2 wheeled) Transfers: Sit to/from Stand Sit to Stand: Max assist         General transfer comment: unable to get to standing despite heavy assist.  After attempt to stand she reported too much pain and says "I don't think I  can do this today"  Ambulation/Gait                Stairs            Wheelchair Mobility    Modified Rankin (Stroke Patients Only)       Balance Overall balance assessment: Needs assistance Sitting-balance support: Bilateral upper extremity supported;Feet supported Sitting balance-Leahy Scale: Fair Sitting balance - Comments: Pt able to maintain sitting balance safely, though c/o increasing back pain with the effort     Standing balance-Leahy Scale: (unable to attain standing)                               Pertinent Vitals/Pain Pain Assessment: 0-10 Pain Score: 8  Pain Location: low/mid back pain, reports as acute on chronic    Home Living Family/patient expects to be discharged to:: Private residence Living Arrangements: Alone Available Help at Discharge: Family;Available PRN/intermittently Type of Home: Apartment Home Access: Level entry     Home Layout: One level Home Equipment: Walker - 4 wheels      Prior Function Level of Independence: Independent with assistive device(s)         Comments: Pt reports that she drives and runs errands, does not usually use AD in her apartment     Hand Dominance        Extremity/Trunk Assessment  Upper Extremity Assessment Upper Extremity Assessment: Generalized weakness(chronic R shoulder issue, functional in limited range)    Lower Extremity Assessment Lower Extremity Assessment: Generalized weakness(grossly 3+ to 4-/5 t/o)       Communication   Communication: No difficulties  Cognition Arousal/Alertness: Awake/alert Behavior During Therapy: Anxious Overall Cognitive Status: Within Functional Limits for tasks assessed                                 General Comments: Pt aware of situation and seems fully alert though minimally motivated to do a lot      General Comments General comments (skin integrity, edema, etc.): Pt does not use O2 at home, brief trial of room  air sats dropped to ~90% relatively quickly and reapplied    Exercises     Assessment/Plan    PT Assessment Patient needs continued PT services  PT Problem List Decreased strength;Decreased range of motion;Decreased activity tolerance;Decreased balance;Decreased mobility;Decreased knowledge of use of DME;Decreased knowledge of precautions;Decreased safety awareness;Pain       PT Treatment Interventions Gait training;DME instruction;Functional mobility training;Therapeutic activities;Therapeutic exercise;Balance training;Patient/family education;Neuromuscular re-education    PT Goals (Current goals can be found in the Care Plan section)  Acute Rehab PT Goals Patient Stated Goal: Pt very much wants to go back to her apartment (no SNF) PT Goal Formulation: With patient Time For Goal Achievement: 03/03/20 Potential to Achieve Goals: Fair    Frequency Min 2X/week   Barriers to discharge        Co-evaluation               AM-PAC PT "6 Clicks" Mobility  Outcome Measure Help needed turning from your back to your side while in a flat bed without using bedrails?: A Little Help needed moving from lying on your back to sitting on the side of a flat bed without using bedrails?: A Lot Help needed moving to and from a bed to a chair (including a wheelchair)?: Total Help needed standing up from a chair using your arms (e.g., wheelchair or bedside chair)?: Total Help needed to walk in hospital room?: Total Help needed climbing 3-5 steps with a railing? : Total 6 Click Score: 9    End of Session Equipment Utilized During Treatment: Oxygen(2L) Activity Tolerance: Patient limited by pain Patient left: with bed alarm set;with call bell/phone within reach Nurse Communication: Mobility status PT Visit Diagnosis: Muscle weakness (generalized) (M62.81);Difficulty in walking, not elsewhere classified (R26.2)    Time: [id number] PT Time Calculation (min) (ACUTE ONLY): 29 min   Charges:    PT Evaluation $PT Eval Low Complexity: 1 Low PT Treatments $Therapeutic Activity: 8-22 mins        Kreg Shropshire, DPT 02/18/2020, 12:34 PM

## 2020-02-18 NOTE — Care Management Important Message (Signed)
Important Message  Patient Details  Name: Brandy Armstrong MRN: [id number] Date of Birth: 08-28-1946   Medicare Important Message Given:  Yes     Dannette Barbara 02/18/2020, 10:55 AM

## 2020-02-18 NOTE — Plan of Care (Signed)
Patient c/o back pain with ineffective control with ultram and states currently oxycodone is more effective. UOP adequate. Intermittantly confused but improving. No fevers today.

## 2020-02-19 DIAGNOSIS — M546 Pain in thoracic spine: Secondary | ICD-10-CM

## 2020-02-19 LAB — CULTURE, BLOOD (ROUTINE X 2): Special Requests: ADEQUATE

## 2020-02-19 LAB — C-REACTIVE PROTEIN: CRP: 23.4 mg/dL — ABNORMAL HIGH (ref <1.0)

## 2020-02-19 NOTE — Progress Notes (Signed)
PT Cancellation Note  Patient Details Name: Brandy Armstrong MRN: [id number] DOB: 1946-04-14   Cancelled Treatment:     PT attempt. Pt refused/hold. Pt was long sitting in bed upon arriving with lunch tray in front of her untouched. She is extremely lethargic and was unable to stay awake. PT will continue to follow per POC and will return if able later this date.   Willette Pa 02/19/2020, 1:49 PM

## 2020-02-19 NOTE — Progress Notes (Addendum)
PROGRESS NOTE  Brandy Armstrong [id number] DOB: Aug 10, 1946 DOA: 02/16/2020 PCP: [name], FNP  Brief History   74 year old woman multiple medical problems outlined below, presented with confusion for approximately 48 hours, subjective chills, right flank pain, fever, dysuria.  Admitted for sepsis secondary to UTI, acute pyelonephritis  A & P  MSSA bacteremia 2/2 bottles, source unclear.  2D echocardiogram reassuring.  MRI thoracic and lumbar spine did not show any clear evidence of infection. --Remains afebrile with improved mentation.  Somewhat sleepy today probably related to Atarax, trazodone and pain medication.  Atarax and narcotic discontinued. --Continue cefazolin, appreciate infectious disease recommendations --Follow-up repeat blood cultures.  Sepsis secondary to MSSA bacteremia, possible UTI, possible acute pyelonephritis.  Sepsis on admission resolved now.  Urine culture unrevealing. --Continue antibiotics.  Back pain.  Secondary to degenerative disease as seen on MRI of lumbar and thoracic spine. --Pain control as tolerated.  PT, OT evaluations  Acute hypoxic respiratory failure, PMH includes COPD --Given her history of COPD, acute illness has unmasked occult hypoxia.  Continue home inhaler --Wean oxygen as tolerated  Acute thrombocytopenia --Likely secondary to infection.  We will recheck CBC in a.m.  Chronic normocytic anemia, per oncology note iron deficiency, due to chronic blood loss complicated by chronic anticoagulation.  Appears to be close to baseline.  Followed by hematology as an outpatient. --No history of bleeding.  Hemoglobin stable.  No further inpatient evaluation suggested.  Atrial fibrillation on chronic anticoagulation --Intermittent mild tachycardia.  Continue propranolol (on for tremors apparently) --Continue apixaban  Rheumatoid arthritis on methotrexate and Arentz. --Hold medications this admission.  Depression --Continue BuSpar,  Zoloft --We will hold trazodone today  Pancreatic insufficiency, chronic --Continue Creon   Obesity unspecified --Continue Ensure per dietitian  Disposition Plan: likely home when improved Continue treatment for MSSA bacteremia of unclear source.  ID following.  Will need repeat blood cultures in 24 hours, may need TEE.  Not ready for discharge.  Resolved issues Hyponatremia --Resolved.  Probably secondary to dehydration.  This is supported by the elevated total bilirubin, BUN and creatinine of on admission which has subsequently resolved.  Acute toxic metabolic encephalopathy.  Reportedly lives alone and able to perform ADLs. --Appears resolved at this point.  She is alert and oriented.  Disposition Plan: From home Will need SNF, now agreeable Still investigating source of MSSA bacteremia.  MRI and 2D echocardiogram unrevealing.  Repeat blood cultures have been obtained. Will need TEE Monday, ongoing treatment of bacteremia.  Anticipate discharge to skilled nursing facility approximately 3 days with PICC line and IV antibiotics.  DVT prophylaxis: apixaban Code Status: Full Family Communication: updated Brandy Armstrong by telephone this afternoon    Brandy Hodgkins, MD  [facility] Direct contact: see [email or web address] (further directions at bottom of note if needed) 7PM-7AM contact night coverage as at bottom of note 02/19/2020, 3:28 PM  LOS: 3 days   Significant Hospital Events   . 2/17 admitted for sepsis, UTI, pyelonephritis.  Bacteremia noted.   Consults:  . Infectious disease   Procedures:  .   Significant Diagnostic Tests:  . CT abdomen pelvis mild right basilar atelectasis . 2D echocardiogram LVEF 60-65%.  Normal LV function.  Normal systolic RV function.  No valve vegetation noted. . 2/18 MRI thoracic spine.  Motion degradation.  No findings of discitis or osteomyelitis.  Mild thoracic spondylosis.  Small T5-T6 right central disc protrusion contacts the ventral spinal  cord.  No central canal stenosis. . 2/18 MRI lumbar spine motion  degradation.  Mild endplate edema L3-L4 may be degenerative.  Patchy edema and enhancement lumbar posterior elements L2-L3 and L3-L4.  Significant facet arthrosis may be degenerative.  Infection cannot be definitively excluded but no other findings.  L3-L4 spondylosis has slightly progressed.   Micro Data:  . Influenza PCR and SARS-CoV-2 negative . Urinalysis grossly positive . 2/17 urine culture   Antimicrobials:  .   Interval History/Subjective  Feels better today.  No back pain.  When she did have the pain she located in mid-back.  Objective   Vitals:  Vitals:   02/19/20 1356 02/19/20 1423  BP: (!) 84/68 94/63  Pulse: (!) 109 (!) 105  Resp: 20 20  Temp: 97.7 F (36.5 C) 97.6 F (36.4 C)  SpO2: 93% 93%    Exam:  Constitutional.  Appears calm, comfortable, sleepy but has awaken and follow commands and participate in exam. Respiratory.  Clear to auscultation bilaterally.  No wheezes, rales or rhonchi.  Normal respiratory effort. Cardiovascular.  Regular rate and rhythm.  No murmur, rub or gallop.  No lower extremity edema. Musculoskeletal.  Moves both legs to command. Psychiatric.  Mildly confused but still better than admission.  I have personally reviewed the following:   Today's Data  . CRP 23.4  Scheduled Meds: . apixaban  5 mg Oral BID  . busPIRone  10 mg Oral TID  . feeding supplement (ENSURE ENLIVE)  237 mL Oral BID BM  . fluticasone furoate-vilanterol  1 puff Inhalation Daily   And  . umeclidinium bromide  1 puff Inhalation Daily  . gabapentin  600 mg Oral TID  . hydroxychloroquine  200 mg Oral BID  . hydrOXYzine  50 mg Oral QHS  . lipase/protease/amylase  36,000 Units Oral TID AC  . pantoprazole  40 mg Oral Daily  . propranolol ER  120 mg Oral Daily  . sertraline  50 mg Oral Daily  . traZODone  50 mg Oral QHS   Continuous Infusions: .  ceFAZolin (ANCEF) IV 2 g (02/19/20 0841)     Principal Problem:   Sepsis ([place]) Active Problems:   Atrial fibrillation with RVR (HCC)   COPD (chronic obstructive pulmonary disease) (HCC)   Tremors of nervous system   Thoracic back pain   PAD (peripheral artery disease) (HCC)   Rheumatoid arthritis (HCC)   Iron deficiency anemia due to chronic blood loss   Obesity, Class II, BMI 35-39.9   MDD (major depressive disorder), recurrent episode, moderate ([place])   Acute lower UTI   MSSA bacteremia   Pyelonephritis   Acute hypoxemic respiratory failure ([place])   Acute encephalopathy   Thrombocytopenia (HCC)   LOS: 3 days   How to contact the [facility] Attending or Consulting provider 7A - 7P or covering provider during after hours 7P -7A, for this patient?  1. Check the care team in [facility] and look for a) attending/consulting TRH provider listed and b) the [facility] team listed 2. Log into [email or web address] and use [place]'s universal password to access. If you do not have the password, please contact the hospital operator. 3. Locate the [facility] provider you are looking for under [facility] and page to a number that you can be directly reached. 4. If you still have difficulty reaching the provider, please page the [facility] (Director on Call) for the Hospitalists listed on amion for assistance.

## 2020-02-20 LAB — BASIC METABOLIC PANEL
Anion gap: 9 (ref 5–15)
BUN: 20 mg/dL (ref 8–23)
CO2: 26 mmol/L (ref 22–32)
Calcium: 8.7 mg/dL — ABNORMAL LOW (ref 8.9–10.3)
Chloride: 99 mmol/L (ref 98–111)
Creatinine, Ser: 0.77 mg/dL (ref 0.44–1.00)
GFR calc Af Amer: >60 mL/min (ref >60)
GFR calc non Af Amer: >60 mL/min (ref >60)
Glucose, Bld: 100 mg/dL — ABNORMAL HIGH (ref 70–99)
Potassium: 3.6 mmol/L (ref 3.5–5.1)
Sodium: 134 mmol/L — ABNORMAL LOW (ref 135–145)

## 2020-02-20 LAB — CBC
HCT: 31.4 % — ABNORMAL LOW (ref 36.0–46.0)
Hemoglobin: 9.5 g/dL — ABNORMAL LOW (ref 12.0–15.0)
MCH: 24.4 pg — ABNORMAL LOW (ref 26.0–34.0)
MCHC: 30.3 g/dL (ref 30.0–36.0)
MCV: 80.7 fL (ref 80.0–100.0)
Platelets: 177 10*3/uL (ref 150–400)
RBC: 3.89 MIL/uL (ref 3.87–5.11)
RDW: 18.4 % — ABNORMAL HIGH (ref 11.5–15.5)
WBC: 7 10*3/uL (ref 4.0–10.5)
nRBC: 0 % (ref 0.0–0.2)

## 2020-02-20 MED ORDER — SENNA 8.6 MG PO TABS
1.0000 | ORAL_TABLET | Freq: Every day | ORAL | Status: DC
  Administered 2020-02-20 – 2020-02-22 (×3): 8.6 mg via ORAL
  Filled 2020-02-20 (×3): qty 1

## 2020-02-20 MED ORDER — TRAZODONE HCL 50 MG PO TABS
50.0000 mg | ORAL_TABLET | Freq: Every day | ORAL | Status: DC
  Administered 2020-02-20 – 2020-02-22 (×3): 50 mg via ORAL
  Filled 2020-02-20 (×3): qty 1

## 2020-02-20 MED ORDER — POLYETHYLENE GLYCOL 3350 17 G PO PACK
17.0000 g | PACK | Freq: Two times a day (BID) | ORAL | Status: DC
  Administered 2020-02-20 – 2020-02-22 (×6): 17 g via ORAL
  Filled 2020-02-20 (×6): qty 1

## 2020-02-20 MED ORDER — BISACODYL 10 MG RE SUPP: 10.0000 mg | Freq: Every day | RECTAL | Status: DC | PRN

## 2020-02-20 NOTE — Progress Notes (Signed)
Physical Therapy Treatment Patient Details Name: Brandy Armstrong MRN: [id number] DOB: 1946-10-23 Today's Date: 02/20/2020    History of Present Illness 74 y.o. female, with history of A. fib on anticoagulation, chronic pancreatic insufficiency, chronic back pain COPD, rheumatoid arthritis here with AMS/confusion.    PT Comments    Pt required constant encouragement to participate during the session but was able to sit EOB and ambulate 6 feet inside her room today. Pt was found on RA and HR and SpO2 remained WNL t/o the session. When doing in-bed exercises, pt's SpO2 got to a low of 88% and with verbal cueing for breathing, pt's SpO2 reached the mid 90's. Given limited functional abilities and difficulty with ambulating very short distances, pt would be unsafe to return to her prior living situation. Pt will benefit from PT services in a SNF setting upon discharge to safely address deficits listed in patient problem list for decreased caregiver assistance and eventual return to PLOF.      Follow Up Recommendations  SNF     Equipment Recommendations  Rolling walker with 5" wheels    Recommendations for Other Services       Precautions / Restrictions Precautions Precautions: Fall Restrictions Weight Bearing Restrictions: No    Mobility  Bed Mobility Overal bed mobility: Needs Assistance Bed Mobility: Supine to Sit;Sit to Supine     Supine to sit: Supervision Sit to supine: Supervision   General bed mobility comments: pt able to complete bed mobility without physical assistance but needed verbal cueing and encourgament along with extra effort and time  Transfers Overall transfer level: Needs assistance Equipment used: Rolling walker (2 wheeled) Transfers: Sit to/from Stand Sit to Stand: Min guard         General transfer comment: extra time and effort; needed encouragment  Ambulation/Gait Ambulation/Gait assistance: Min guard Gait Distance (Feet): 6 Feet Assistive  device: Rolling walker (2 wheeled) Gait Pattern/deviations: Step-to pattern;Decreased stride length Gait velocity: decreased   General Gait Details: Very effortful and slow. Pt had difficulty advancing her LLE during swing and struggled with foot clearence.   Stairs             Wheelchair Mobility    Modified Rankin (Stroke Patients Only)       Balance Overall balance assessment: Needs assistance Sitting-balance support: Bilateral upper extremity supported;Feet supported Sitting balance-Leahy Scale: Fair Sitting balance - Comments: Pt able to maintain sitting balance safely with UE assist, though c/o stomach pain with the effort   Standing balance support: Bilateral upper extremity supported Standing balance-Leahy Scale: Poor Standing balance comment: heavy bilat UE assist from the RW                            Cognition Arousal/Alertness: Awake/alert Behavior During Therapy: WFL for tasks assessed/performed Overall Cognitive Status: Within Functional Limits for tasks assessed                                 General Comments: Low motivation to participate, needs encouragment      Exercises Total Joint Exercises Ankle Circles/Pumps: AROM;Strengthening;Both;10 reps Quad Sets: Strengthening;Both;10 reps Gluteal Sets: Strengthening;Both;10 reps Towel Squeeze: AROM;Strengthening;Both;10 reps Long Arc Quad: AROM;Strengthening;Both;10 reps Marching in Standing: AROM;Strengthening;Both;5 reps Other Exercises Other Exercises: static standing and sitting balance    General Comments        Pertinent Vitals/Pain Pain Score: 8  Pain Location:  LBP    Home Living                      Prior Function            PT Goals (current goals can now be found in the care plan section) Progress towards PT goals: Progressing toward goals    Frequency    Min 2X/week      PT Plan Current plan remains appropriate    Co-evaluation               AM-PAC PT "6 Clicks" Mobility   Outcome Measure  Help needed turning from your back to your side while in a flat bed without using bedrails?: A Little Help needed moving from lying on your back to sitting on the side of a flat bed without using bedrails?: A Little Help needed moving to and from a bed to a chair (including a wheelchair)?: A Lot Help needed standing up from a chair using your arms (e.g., wheelchair or bedside chair)?: A Little Help needed to walk in hospital room?: A Lot Help needed climbing 3-5 steps with a railing? : Total 6 Click Score: 14    End of Session Equipment Utilized During Treatment: Gait belt Activity Tolerance: Patient tolerated treatment well Patient left: with bed alarm set;with call bell/phone within reach;in bed;with nursing/sitter in room Nurse Communication: Mobility status PT Visit Diagnosis: Muscle weakness (generalized) (M62.81);Difficulty in walking, not elsewhere classified (R26.2)     Time: [id number] PT Time Calculation (min) (ACUTE ONLY): 35 min  Charges:                        Annabelle Harman, SPT 02/20/20 12:27 PM

## 2020-02-20 NOTE — Progress Notes (Signed)
PROGRESS NOTE  Brandy Armstrong [id number] DOB: [birth year] DOA: 02/16/2020 PCP: [name], FNP  Brief History   74 year old woman multiple medical problems outlined below, presented with confusion for approximately 48 hours, subjective chills, right flank pain, fever, dysuria.  Admitted for sepsis secondary to UTI, acute pyelonephritis  A/P  MSSA bacteremia 2/2 bottles, source unclear.  2D echocardiogram reassuring.  MRI thoracic and lumbar spine did not show any clear evidence of infection. --Remains afebrile.  Mentation appears to be at baseline. --Continue cefazolin, follow-up blood cultures, hopefully can place PICC line in the next 48 hours if cultures remain negative.  Appreciate infectious disease recommendations. --Plan for TEE 2/22 hopefully, this has been requested  Sepsis secondary to MSSA bacteremia, possible UTI, possible acute pyelonephritis.  Sepsis on admission resolved now.  Urine culture unrevealing. --Sepsis resolved.  Continue antibiotics.  Thoracic back pain.  Secondary to degenerative disease as seen on MRI of lumbar and thoracic spine. --Continue Tylenol and tramadol.  Became quite somnolent with oxycodone.  Could challenge with a lower dose of pain is uncontrolled with tramadol. --Continue PT  Acute hypoxic respiratory failure, PMH includes COPD. Given her history of COPD, acute illness has unmasked occult hypoxia.  Continue home inhaler --Off oxygen at the moment.  Continue to follow.  Chronic normocytic anemia, per oncology note iron deficiency, due to chronic blood loss complicated by chronic anticoagulation.  Appears to be close to baseline.  Followed by hematology as an outpatient. No history of bleeding.  Hemoglobin stable.  No further inpatient evaluation suggested.  Atrial fibrillation on chronic anticoagulation --Intermittent mild tachycardia.  Continue propranolol (on for tremors apparently).  Asymptomatic. --Continue apixaban  Rheumatoid  arthritis on methotrexate and Arentz. --Hold medications this admission.  Depression --Continue BuSpar, Zoloft --Will restart trazodone  Pancreatic insufficiency, chronic --Continue Creon   Obesity unspecified --Continue Ensure per dietitian  Resolved issues Hyponatremia --Resolved.  Probably secondary to dehydration.  This is supported by the elevated total bilirubin, BUN and creatinine of on admission which has subsequently resolved.  Acute toxic metabolic encephalopathy.  Reportedly lives alone and able to perform ADLs. --Appears resolved at this point.  She is alert and oriented.  Acute thrombocytopenia --Secondary to bacteremia.  Now resolved..  Disposition Plan: From home.  At this point will need short-term SNF as she is far below functional status at baseline, lives alone, mobility is hampered by back pain and acute infection/illness.  She is on IV antibiotics for bacteremia and awaiting repeat cultures in order to place PICC line, TEE and completion of evaluation.  Hopefully in the next 48 hours will be stable for transfer to SNF.  DVT prophylaxis: apixaban Code Status: Full Family Communication: updated [name] by telephone  [name], MD  [facility] Direct contact: see [email or web address] (further directions at bottom of note if needed) 7PM-7AM contact night coverage as at bottom of note 02/20/2020, 12:17 PM  LOS: 4 days   Significant Hospital Events   . 2/17 admitted for sepsis, UTI, pyelonephritis.  Bacteremia noted.   Consults:  . Infectious disease   Procedures:  .   Significant Diagnostic Tests:  . CT abdomen pelvis mild right basilar atelectasis . 2D echocardiogram LVEF 60-65%.  Normal LV function.  Normal systolic RV function.  No valve vegetation noted. . 2/18 MRI thoracic spine.  Motion degradation.  No findings of discitis or osteomyelitis.  Mild thoracic spondylosis.  Small T5-T6 right central disc protrusion contacts the ventral spinal cord.   No central canal  stenosis. . 2/18 MRI lumbar spine motion degradation.  Mild endplate edema L3-L4 may be degenerative.  Patchy edema and enhancement lumbar posterior elements L2-L3 and L3-L4.  Significant facet arthrosis may be degenerative.  Infection cannot be definitively excluded but no other findings.  L3-L4 spondylosis has slightly progressed.   Micro Data:  . Influenza PCR and SARS-CoV-2 negative . Urinalysis grossly positive . 2/17 urine culture   Antimicrobials:  .   Interval History/Subjective  Seems to be okay today.  Back is giving her trouble.  Objective   Vitals:  Vitals:   02/20/20 1000 02/20/20 1053  BP: 112/82   Pulse: (!) 106 97  Resp:    Temp:    SpO2: 97%     Exam:  Constitutional.  Appears calm, comfortable. Respiratory.  Clear to auscultation bilaterally.  No wheezes, rales or rhonchi.  Mild increased respiratory effort. Cardiovascular.  Regular rate and rhythm.  No murmur, rub or gallop.  No lower extremity edema. Psychiatric.  Grossly normal mood and affect.  Speech fluent and appropriate.  I have personally reviewed the following:   Today's Data  . BMP unremarkable . Hemoglobin stable at 9.5, WBC and platelets within normal limits  Scheduled Meds: . apixaban  5 mg Oral BID  . busPIRone  10 mg Oral TID  . feeding supplement (ENSURE ENLIVE)  237 mL Oral BID BM  . fluticasone furoate-vilanterol  1 puff Inhalation Daily   And  . umeclidinium bromide  1 puff Inhalation Daily  . gabapentin  600 mg Oral TID  . hydroxychloroquine  200 mg Oral BID  . lipase/protease/amylase  36,000 Units Oral TID AC  . pantoprazole  40 mg Oral Daily  . polyethylene glycol  17 g Oral BID  . propranolol ER  120 mg Oral Daily  . senna  1 tablet Oral QHS  . sertraline  50 mg Oral Daily   Continuous Infusions: .  ceFAZolin (ANCEF) IV 2 g (02/20/20 1101)    Principal Problem:   Sepsis ([place]) Active Problems:   Atrial fibrillation with RVR (HCC)   COPD (chronic  obstructive pulmonary disease) (HCC)   Tremors of nervous system   Thoracic back pain   PAD (peripheral artery disease) (HCC)   Rheumatoid arthritis (HCC)   Iron deficiency anemia due to chronic blood loss   Obesity, Class II, BMI 35-39.9   MDD (major depressive disorder), recurrent episode, moderate (HCC)   Acute lower UTI   MSSA bacteremia   Pyelonephritis   Acute hypoxemic respiratory failure (HCC)   Acute encephalopathy   Thrombocytopenia (HCC)   LOS: 4 days   How to contact the [facility] Attending or Consulting provider 7A - 7P or covering provider during after hours 7P -7A, for this patient?  1. Check the care team in [facility] and look for a) attending/consulting TRH provider listed and b) the [facility] team listed 2. Log into [email or web address] and use [place]'s universal password to access. If you do not have the password, please contact the hospital operator. 3. Locate the [facility] provider you are looking for under [facility] and page to a number that you can be directly reached. 4. If you still have difficulty reaching the provider, please page the [facility] (Director on Call) for the Hospitalists listed on amion for assistance.

## 2020-02-20 NOTE — Progress Notes (Signed)
    We have been asked to do a TEE on this patient for bacteremia.   Will make sure our team is aware and will keep her NPO for possible TEE tomorrow    Mertie Moores, MD  02/20/2020 12:39 PM    [address],  [address], [place]  [id number] Phone: [phone]; Fax: [phone]

## 2020-02-21 ENCOUNTER — Encounter
Admission: EM | Disposition: A | Discharge status: Skilled Nursing Facility | Payer: Medicare Other | Source: Home / Self Care | Attending: Family Medicine

## 2020-02-21 DIAGNOSIS — J441 Chronic obstructive pulmonary disease with (acute) exacerbation: Secondary | ICD-10-CM

## 2020-02-21 HISTORY — PX: TEE WITHOUT CARDIOVERSION: SHX5443

## 2020-02-21 SURGERY — ECHOCARDIOGRAM, TRANSESOPHAGEAL
Anesthesia: Moderate Sedation

## 2020-02-21 MED ORDER — METHYLPREDNISOLONE SODIUM SUCC 125 MG IJ SOLR
60.0000 mg | Freq: Two times a day (BID) | INTRAMUSCULAR | Status: AC
  Administered 2020-02-21 – 2020-02-22 (×3): 60 mg via INTRAVENOUS
  Filled 2020-02-21 (×3): qty 2

## 2020-02-21 MED ORDER — MIDAZOLAM HCL 5 MG/5ML IJ SOLN
INTRAMUSCULAR | Status: AC
  Filled 2020-02-21: qty 5

## 2020-02-21 MED ORDER — LIDOCAINE VISCOUS HCL 2 % MT SOLN
OROMUCOSAL | Status: AC
  Filled 2020-02-21: qty 15

## 2020-02-21 MED ORDER — IPRATROPIUM-ALBUTEROL 0.5-2.5 (3) MG/3ML IN SOLN
RESPIRATORY_TRACT | Status: AC
  Administered 2020-02-21: 10:00:00 3 mL via RESPIRATORY_TRACT
  Filled 2020-02-21: qty 3

## 2020-02-21 MED ORDER — SODIUM CHLORIDE FLUSH 0.9 % IV SOLN
INTRAVENOUS | Status: AC
  Administered 2020-02-21: 10 mL
  Filled 2020-02-21: qty 10

## 2020-02-21 MED ORDER — IPRATROPIUM-ALBUTEROL 0.5-2.5 (3) MG/3ML IN SOLN
3.0000 mL | Freq: Four times a day (QID) | RESPIRATORY_TRACT | Status: DC | PRN
  Administered 2020-02-22: 13:00:00 3 mL via RESPIRATORY_TRACT
  Filled 2020-02-21: qty 3

## 2020-02-21 MED ORDER — DILTIAZEM HCL 30 MG PO TABS
30.0000 mg | ORAL_TABLET | Freq: Four times a day (QID) | ORAL | Status: DC
  Administered 2020-02-21 – 2020-02-23 (×7): 30 mg via ORAL
  Filled 2020-02-21 (×8): qty 1

## 2020-02-21 MED ORDER — SODIUM CHLORIDE 0.9 % IV SOLN
INTRAVENOUS | Status: DC | PRN
  Administered 2020-02-21: 50 mL via INTRAVENOUS

## 2020-02-21 MED ORDER — BUTAMBEN-TETRACAINE-BENZOCAINE 2-2-14 % EX AERO
INHALATION_SPRAY | CUTANEOUS | Status: AC
  Filled 2020-02-21: qty 5

## 2020-02-21 MED ORDER — IPRATROPIUM-ALBUTEROL 0.5-2.5 (3) MG/3ML IN SOLN: 3.0000 mL | Freq: Once | RESPIRATORY_TRACT | Status: AC

## 2020-02-21 MED ORDER — FENTANYL CITRATE (PF) 100 MCG/2ML IJ SOLN
INTRAMUSCULAR | Status: AC
  Filled 2020-02-21: qty 2

## 2020-02-21 NOTE — TOC Initial Note (Signed)
Transition of Care [facility]) - Initial/Assessment Note    Patient Details  Name: Brandy Armstrong MRN: [id number] Date of Birth: May 09, 1946  Transition of Care [facility]) CM/SW Contact:    Magnus Ivan, [place] Phone Number: 02/21/2020, 1:50 PM  Clinical Narrative:                CSW spoke with patient's HCPOA Irma Newness due to patient's fluctuating mental status per RN. Discussed recommendation for rehab at a SNF following discharge. [name] reported she and patient's sister agree with this recommendation as patient lives alone and they both live out of town. CSW sent list of SNFs to [facility] via email. [name] reported she will talk with patient and patient's sister and let CSW know which facilities they may be interested in. CSW will continue to follow.          Patient Goals and CMS Choice        Expected Discharge Plan and Services                                                Prior Living Arrangements/Services                       Activities of Daily Living Home Assistive Devices/Equipment: Gilford Rile (specify type) ADL Screening (condition at time of admission) Patient's cognitive ability adequate to safely complete daily activities?: Yes Is the patient deaf or have difficulty hearing?: No Does the patient have difficulty seeing, even when wearing glasses/contacts?: No Does the patient have difficulty concentrating, remembering, or making decisions?: No Patient able to express need for assistance with ADLs?: Yes Does the patient have difficulty dressing or bathing?: No Independently performs ADLs?: Yes (appropriate for developmental age) Does the patient have difficulty walking or climbing stairs?: No Weakness of Legs: Both Weakness of Arms/Hands: None  Permission Sought/Granted                  Emotional Assessment              Admission diagnosis:  Sepsis ([place]) [A41.9] Sepsis without acute organ dysfunction, due to unspecified organism [facility])  [A41.9] Patient Active Problem List   Diagnosis Date Noted  . MSSA bacteremia 02/17/2020  . Pyelonephritis 02/17/2020  . Acute hypoxemic respiratory failure ([place]) 02/17/2020  . Acute encephalopathy 02/17/2020  . Thrombocytopenia ([place]) 02/17/2020  . Sepsis ([place]) 02/16/2020  . Acute lower UTI 02/16/2020  . Toxic metabolic encephalopathy   . Pancreatic insufficiency 01/17/2020  . Pancreatic divisum 01/17/2020  . 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 MDD (major depressive disorder), recurrent episode, moderate ([place]) 08/18/2019  . GAD (generalized anxiety disorder) 08/18/2019  . Panic attacks 08/18/2019  . Cognitive disorder 08/18/2019  . Chest pain 07/09/2019  . Greater trochanteric bursitis of left hip 05/21/2019  . Anesthesia of skin 05/10/2019  . Loose stools 04/07/2019  . Anxiety 04/02/2019  . Appendicitis with perforation 03/09/2019  . Acute appendicitis   . Acute appendicitis with localized peritonitis 02/19/2019  . Chronic diastolic CHF (congestive heart failure) ([place]) 02/10/2019  . Atherosclerosis of aorta ([place]) 02/05/2019  . Bronchitis 12/28/2018  . AAA (abdominal aortic aneurysm) without rupture ([place]) 12/17/2018  . COPD exacerbation ([place]) 12/09/2018  . Iron deficiency anemia due to chronic blood loss 11/03/2018  . Obesity, Class II, BMI 35-39.9 09/23/2018  . Primary  osteoarthritis of right knee 09/23/2018  . GERD (gastroesophageal reflux disease) 08/24/2018  . Hives 06/17/2018  . B12 deficiency 06/17/2018  . Anxiety and depression 03/23/2018  . Purpura ([place]) 08/06/2017  . Radicular pain in left arm 09/30/2016  . Cellulitis 09/26/2016  . Abscess 09/24/2016  . PAD (peripheral artery disease) ([place]) 08/13/2016  . Anemia 08/13/2016  . Urinary urgency 08/06/2016  . Thoracic back pain 05/20/2016  . Neuropathy 10/05/2015  . Tremors of nervous system 06/18/2015  . GI bleed due to NSAIDs 05/04/2015  . Dysuria 03/07/2015  . Atrial fibrillation with  RVR ([place]) 03/11/2014  . COPD (chronic obstructive pulmonary disease) ([place]) 03/11/2014  . Flexor hallucis longus tendinitis 11/09/2013  . Vertigo 09/16/2013  . Chronic steroid use 09/11/2013  . Osteoarthritis 09/11/2013  . Shortness of breath 08/18/2013  . Generalized weakness 08/18/2013  . Barrett's esophagus 08/18/2013  . Chronic diarrhea 08/18/2013  . Numbness and tingling 08/18/2013  . Screening for breast cancer 08/18/2013  . Rheumatoid arthritis ([place]) 05/31/2013  . Seborrheic keratosis 12/14/2012  . Benign neoplasm of colon 11/23/2012  . Family history of malignant neoplasm of gastrointestinal tract 11/23/2012  . Prediabetes 12/16/2011   PCP:  [name], FNP Pharmacy:   [id number] - [place], [place] - [address] [place] [address] Phone: [phone] Fax: [phone]     Social Determinants of Health (SDOH) Interventions    Readmission Risk Interventions No flowsheet data found.

## 2020-02-21 NOTE — Progress Notes (Signed)
Upon arrival, patient experiencing labored breathing, expiratory wheezes throughout, on 2L O2. MD ordered duoneb. No improvement post breathing treatment. Procedure cancelled at this time and [place], RN notified and updated.

## 2020-02-21 NOTE — Consult Note (Addendum)
Cardiology Consultation:   Patient ID: MELEANA ENRIGHT MRN: [id number]; DOB: [birth year]  Admit date: 02/16/2020 Date of Consult: 02/21/2020  Primary Care Provider: [name], FNP Primary Cardiologist: Ida Rogue, MD  Primary Electrophysiologist:  None   Patient Profile:   Brandy Armstrong is a 74 y.o. female with a hx of permanent atrial fibrillation, aortic atherosclerosis, chronic diastolic heart failure, COPD, HTN, RA, anemia, pancreatic insufficiency with chronic diarrhea, and anxiety, and who is being seen today for the evaluation of TEE / bactermia at the request of Dr. Posey Pronto.  History of Present Illness:   Ms. Hullinger is a 74 year old female with PMH as above.  She is s/p ablation in 2010 with recurrent atrial fibrillation s/p repeat ablation 01/2015 s/p DCCV 02/2015 s/p DCCV 08/2016 with recurrent atrial fibrillation 1 month later leading to discontinuation of amiodarone with rate control strategy since.  She has a history of GI bleed on Xarelto.  She was admitted 02/19/2021 03/08/2019 for acute appendicitis complicated by perforation.  She was in atrial fibrillation with RVR respiratory failure, which required mechanical ventilation echo showed EF greater than 65%.  Rates were difficult to control postoperatively.  TEE/DCCV was considered but rate control recommended as she had previously not held sinus rhythm post cardioversion.  Her dry weight is approximately 200 pounds.  She has had several visits to our office in the last year for lower extremity edema.  It has been thought that her LEE is a combination of both heart failure and possibly third spacing from hypoalbuminemia.  She was last seen by our office 01/20/2020. She was started on hydrochlorothiazide 25 mg daily.  She reports that, leading up to this admission, she was in significant pain all over her body. She reportedly had a fever but was unaware at the time. At admission, she was found to have encephalopathy, secondary to  sepsis. She states today that she was initially disoriented when presenting to the hospital but now feels fully oriented (with all orientation questions answered correctly today). She reports that, on presentation, she had significant abdominal and back pain that is now significantly improved, including her back pain. She states she is normally asx in atrial fibrillation and denies any recent racing HR or palpitations. She denies any current CP or increased SOB from baseline, leading up to the admission or currently, though on 2L [place]. She states that she is not normally on oxygen at home. She does admit to DOE at baseline. In the ED, T 100.4, RR 25, BP 144/87.  Labs showed sodium 130, potassium 4.0, ALT 9, AST 21, creatinine 1.13, BUN 30, albumin 3.3, calcium 9.1, Hgb 10.6, WBC 8.0, plts 138.  CXR showed minimal atelectasis and pleural fluid on the R. CT abdomen showed diverticulosis without diverticulitis, mild ectasia of the infrarenal aorta. Once admitted, she was found to have MSSA bacteremia with sepsis with unclear source. 2/2 back pain, MRI performed and without discitis/osteomyelitis. ID consulted and TTE with TEE recommended. Echo showed EF 60-65% and no vegetation noted. Today, she reports that she has difficulty getting back up to the seated position after she lays down.   Please refer to previous progress note for consent performed this morning.  Heart Pathway Score:     Past Medical History:  Diagnosis Date  . A-fib ([place])   . Barretts esophagus   . Chest pain    a. 02/2014 Myoview: Ef 50%, no ischemia.  . Colon polyps   . COPD (chronic obstructive pulmonary disease) ([place])   .  Depression with anxiety   . GERD (gastroesophageal reflux disease)   . Hx of benign essential tremor   . Neuropathy   . Permanent atrial fibrillation [facility])    a. s/p ablation 01/2012 in [facility] by Dr. Boyd Kerbs;  b. On sotalol & Xarelto;  c. 02/2014 Echo: EF 50-55%, mild conc LVH, nl LA size/structure;  d. Recurrent  afib 8/15 & 08/29/2014.  [name] Rectal fistula   . Rheumatoid arthritis ([place])    On methotrexate and orencia  . Urinary incontinence   . Vitamin D deficiency     Past Surgical History:  Procedure Laterality Date  . ABDOMINAL HYSTERECTOMY  1992  . APPENDECTOMY    . CARDIAC ELECTROPHYSIOLOGY STUDY AND ABLATION  2013  . CHOLECYSTECTOMY  1987  . COLONOSCOPY N/A 05/08/2015   Procedure: COLONOSCOPY;  Surgeon: Manya Silvas, MD;  Location: [facility] ENDOSCOPY;  Service: Endoscopy;  Laterality: N/A;  . COLONOSCOPY WITH PROPOFOL N/A 09/29/2019   Procedure: COLONOSCOPY WITH PROPOFOL;  Surgeon: Toledo, Benay Pike, MD;  Location: [facility] ENDOSCOPY;  Service: Gastroenterology;  Laterality: N/A;  . ESOPHAGOGASTRODUODENOSCOPY N/A 05/06/2015   Procedure: ESOPHAGOGASTRODUODENOSCOPY (EGD);  Surgeon: Lollie Sails, MD;  Location: [facility] ENDOSCOPY;  Service: Endoscopy;  Laterality: N/A;  . ESOPHAGOGASTRODUODENOSCOPY (EGD) WITH PROPOFOL N/A 09/29/2019   Procedure: ESOPHAGOGASTRODUODENOSCOPY (EGD) WITH PROPOFOL;  Surgeon: Toledo, Benay Pike, MD;  Location: [facility] ENDOSCOPY;  Service: Gastroenterology;  Laterality: N/A;  . LAPAROSCOPIC APPENDECTOMY N/A 02/19/2019   Procedure: APPENDECTOMY LAPAROSCOPIC converted to open appendectomy;  Surgeon: Herbert Pun, MD;  Location: [facility] ORS;  Service: General;  Laterality: N/A;  . OOPHORECTOMY    . oophrectomy Bilateral 1992     Home Medications:  Prior to Admission medications   Medication Sig Start Date End Date Taking? Authorizing Provider  busPIRone (BUSPAR) 10 MG tablet Take 1 tablet (10 mg total) by mouth 3 (three) times daily. 01/19/20  Yes Ursula Alert, MD  CREON 36000 units CPEP capsule Takes 2 capsules three times daily. 10/21/19  Yes [provider]  cyclobenzaprine (FLEXERIL) 5 MG tablet Take 1 tablet (5 mg total) by mouth 3 (three) times daily as needed for muscle spasms. 02/14/20  Yes Arnett, Yvetta Coder, FNP  ELIQUIS 5 MG TABS tablet Take 1 tablet (5 mg  total) by mouth 2 (two) times daily. 06/24/19  Yes Minna Merritts, MD  esomeprazole (NEXIUM) 20 MG capsule TAKE 1 CAPSULE BY MOUTH EVERY DAY 09/29/19  Yes Arnett, Yvetta Coder, FNP  Fluticasone-Umeclidin-Vilant (TRELEGY ELLIPTA) 100-62.5-25 MCG/INH AEPB Inhale 1 puff into the lungs daily. 02/14/20  Yes Tyler Pita, MD  gabapentin (NEURONTIN) 600 MG tablet TAKE 1 TABLET BY MOUTH THREE TIMES A DAY 01/04/20  Yes Arnett, Yvetta Coder, FNP  HUMIRA PEN 40 MG/0.4ML PNKT Inject 40 mg as directed. Every 2 weeks 07/07/18  Yes [provider]  hydrochlorothiazide (HYDRODIURIL) 25 MG tablet Take 1 tablet (25 mg total) by mouth daily. 01/20/20 04/19/20 Yes Loel Dubonnet, NP  hydroxychloroquine (PLAQUENIL) 200 MG tablet Take 200 mg by mouth 2 (two) times daily.  06/21/16  Yes [provider]  hydrOXYzine (ATARAX/VISTARIL) 50 MG tablet Take 0.5-1 tablets (25-50 mg total) by mouth daily as needed for anxiety. Patient has supplies Patient taking differently: Take 50 mg by mouth at bedtime. Can take an extra 50 mg if needed 10/27/19  Yes Eappen, Saramma, MD  hyoscyamine (LEVSIN) 0.125 MG tablet Take 0.125 mg by mouth as needed.   Yes [provider]  propranolol ER (INDERAL LA) 120  MG 24 hr capsule Take 1 capsule (120 mg total) by mouth daily. 05/27/19  Yes Dunn, Ryan M, PA-C  sertraline (ZOLOFT) 50 MG tablet TAKE 1 TABLET BY MOUTH EVERY DAY 01/18/20  Yes Ursula Alert, MD  traZODone (DESYREL) 50 MG tablet Take 1 tablet (50 mg total) by mouth at bedtime. 01/19/20  Yes Ursula Alert, MD    Inpatient Medications: Scheduled Meds: . apixaban  5 mg Oral BID  . busPIRone  10 mg Oral TID  . feeding supplement (ENSURE ENLIVE)  237 mL Oral BID BM  . fluticasone furoate-vilanterol  1 puff Inhalation Daily   And  . umeclidinium bromide  1 puff Inhalation Daily  . gabapentin  600 mg Oral TID  . lipase/protease/amylase  36,000 Units Oral TID AC  . pantoprazole  40 mg Oral Daily  . polyethylene  glycol  17 g Oral BID  . propranolol ER  120 mg Oral Daily  . senna  1 tablet Oral QHS  . sertraline  50 mg Oral Daily  . traZODone  50 mg Oral QHS   Continuous Infusions: .  ceFAZolin (ANCEF) IV Stopped (02/21/20 0122)   PRN Meds: acetaminophen **OR** acetaminophen, bisacodyl, hyoscyamine, ondansetron **OR** ondansetron (ZOFRAN) IV, traMADol  Allergies:    Allergies  Allergen Reactions  . Latex     Itching Itching    Social History:   Social History   Socioeconomic History  . Marital status: Single    Spouse name: Not on file  . Number of children: 0  . Years of education: 16  . Highest education level: Bachelor's degree (e.g., BA, AB, BS)  Occupational History  . Occupation: Retired  Tobacco Use  . Smoking status: Former Smoker    Packs/day: 1.00    Years: 40.00    Pack years: 40.00    Quit date: 12/16/2011    Years since quitting: 8.1  . Smokeless tobacco: Never Used  Substance and Sexual Activity  . Alcohol use: Not Currently    Alcohol/week: 0.0 standard drinks    Comment: Occasionally has a drink  . Drug use: No  . Sexual activity: Never  Other Topics Concern  . Not on file  Social History Narrative   Ms. Fussell was born and reared in [place]. She attended [employer] and graduated in 1971 with her [place] in Education. 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 She then worked for a [employer] in [place]. She is single. She is currently retired. She loves reading. She loves to spend time with her dog.       Caffeine use: 2 cups coffee per day   2 glasses iced tea/ day   Right-handed   Social Determinants of Health   Financial Resource Strain: Medium Risk  . Difficulty of Paying Living Expenses: Somewhat hard  Food Insecurity: Food Insecurity Present  . Worried About Charity fundraiser in the Last Year: Sometimes true  . Ran Out of Food in the Last Year: Sometimes true  Transportation  Needs: No Transportation Needs  . Lack of Transportation (Medical): No  . Lack of Transportation (Non-Medical): No  Physical Activity: Inactive  . Days of Exercise per Week: 0 days  . Minutes of Exercise per Session: 0 min  Stress: Stress Concern Present  . Feeling of Stress : To some extent  Social Connections: Unknown  . Frequency of Communication with Friends and Family: Not on file  . Frequency of  Social Gatherings with Friends and Family: Not on file  . Attends Religious Services: More than 4 times per year  . Active Member of Clubs or Organizations: No  . Attends Archivist Meetings: Never  . Marital Status: Never married  Intimate Partner Violence: Not At Risk  . Fear of Current or Ex-Partner: No  . Emotionally Abused: No  . Physically Abused: No  . Sexually Abused: No    Family History:      Family History  Problem Relation Age of Onset  . Depression Mother   . Pancreatic cancer Mother   . Suicidality Mother   . Colon cancer Father   . Breast cancer Sister 71  . Diabetes Brother   . Breast cancer Cousin   . Neuropathy Neg Hx      ROS:  Please see the history of present illness.  Review of Systems  Constitutional: Positive for fever and malaise/fatigue.  Respiratory: Negative for shortness of breath and wheezing.   Cardiovascular: Negative for chest pain, palpitations, leg swelling and PND.  Musculoskeletal: Positive for myalgias.  All other systems reviewed and are negative.   All other ROS reviewed and negative.     Physical Exam/Data:   Vitals:   02/20/20 2044 02/20/20 2327 02/21/20 0356 02/21/20 0358  BP:   (!) 131/91   Pulse: 95  (!) 112 97  Resp:  20 20   Temp:   98.9 F (37.2 C)   TempSrc:   Oral   SpO2:   94%   Weight:      Height:        Intake/Output Summary (Last 24 hours) at 02/21/2020 0830 Last data filed at 02/21/2020 0548 Gross per 24 hour  Intake 300.42 ml  Output 1750 ml  Net -1449.58 ml   Last 3 Weights 02/16/2020  02/14/2020 01/26/2020  Weight (lbs) 194 lb 0.1 oz 191 lb 195 lb 9.6 oz  Weight (kg) 88 kg 86.637 kg 88.724 kg     Body mass index is 30.39 kg/m.  General:  Well nourished, well developed, in no acute distress HEENT: normal. On 2L [place] oxygen Neck: no JVD Vascular: No carotid bruits; radial pulses 2+ bilaterally Cardiac:  normal S1, S2; IRIR; no murmur  Lungs: poor inspiratory effort, significantly reduced R basilar breath sounds and slightly reduced L, no wheezing, rhonchi or rales  Abd: soft, nontender, no hepatomegaly  Ext: no edema Musculoskeletal:  No deformities, BUE and BLE strength normal and equal Skin: warm and dry  Neuro:  No focal abnormalities noted Psych:  Normal affect   EKG:  The EKG was personally reviewed and demonstrates:  Atrial fibrillation with ventricular rate 112bpm, LAD, poor R wave progression in the precordial leads Telemetry:  Telemetry was personally reviewed and demonstrates:  Not on telemetry. Please place on telemetry.  Relevant CV Studies: Echo 02/21/2020 1. Left ventricular ejection fraction, by estimation, is 60 to 65%. The  left ventricle has normal function. The left ventricle has no regional  wall motion abnormalities. Left ventricular diastolic parameters are  indeterminate.  2. Right ventricular systolic function is normal. The right ventricular  size is normal.  3. No valve vegetation noted   Laboratory Data:  High Sensitivity Troponin:  No results for input(s): TROPONINIHS in the last 720 hours.   Cardiac EnzymesNo results for input(s): TROPONINI in the last 168 hours. No results for input(s): TROPIPOC in the last 168 hours.  Chemistry Recent Labs  Lab 02/17/20 0507 02/18/20 0250 02/20/20 [id number]  NA 135 136 134*  K 3.5 3.7 3.6  CL 106 104 99  CO2 22 24 26   GLUCOSE 90 83 100*  BUN 20 18 20   CREATININE 0.79 0.80 0.77  CALCIUM 7.7* 8.5* 8.7*  GFRNONAA >60 >60 >60  GFRAA >60 >60 >60  ANIONGAP 7 8 9     Recent Labs  Lab  02/16/20 1052  PROT 7.3  ALBUMIN 3.3*  AST 21  ALT 9  ALKPHOS 82  BILITOT 1.3*   Hematology Recent Labs  Lab 02/17/20 0507 02/18/20 0250 02/20/20 0416  WBC 6.7 6.0 7.0  RBC 3.99 4.04 3.89  HGB 9.7* 9.8* 9.5*  HCT 32.3* 32.4* 31.4*  MCV 81.0 80.2 80.7  MCH 24.3* 24.3* 24.4*  MCHC 30.0 30.2 30.3  RDW 18.1* 18.2* 18.4*  PLT 120* 141* 177   BNPNo results for input(s): BNP, PROBNP in the last 168 hours.  DDimer No results for input(s): DDIMER in the last 168 hours.   Radiology/Studies:  MR THORACIC SPINE W WO CONTRAST  Result Date: 02/17/2020 CLINICAL DATA:  Back pain, MSSA bacteremia, assess for vertebral osteomyelitis, discitis; sepsis. EXAM: MRI THORACIC WITHOUT AND WITH CONTRAST TECHNIQUE: Multiplanar and multiecho pulse sequences of the thoracic spine were obtained without and with intravenous contrast. CONTRAST:  7.1mL GADAVIST GADOBUTROL 1 MMOL/ML IV SOLN COMPARISON:  CT angiogram chest 05/11/2014 FINDINGS: MRI THORACIC SPINE FINDINGS Multiple sequences are significantly motion degraded, limiting evaluation. Most notably, there is moderate to moderately severe motion degradation of the sagittal and axial T1 weighted postcontrast imaging and moderate motion degradation of the axial T2 weighted imaging. Alignment:  Normal Vertebrae: Vertebral body height is maintained. Within described limitations, no marrow edema or abnormal osseous enhancement is demonstrated. Cord: Within described limitations, no spinal cord signal abnormality is identified. Paraspinal and other soft tissues: T2 hyperintense signal abnormality within the dependent aspect of the mid to lower lungs is nonspecific. No paraspinal soft tissue abnormality is identified. Disc levels: Mild disc degeneration throughout the thoracic spine. At T1-T2, small central disc protrusion without contact upon the spinal cord or significant canal stenosis. At T5-T6, there is a small right center disc protrusion which contacts the  right ventral spinal cord. No significant central canal stenosis at this level. At T7-T8, tiny right center disc protrusion without canal stenosis. No significant focal disc herniation or spinal canal stenosis at the remaining levels. No significant foraminal stenosis within the thoracic spine. Multilevel ventrolateral osteophytes within the lower thoracic spine. IMPRESSION: 1. Examination limited by significant motion degradation. 2. No appreciable findings of discitis/osteomyelitis within the thoracic spine. No paraspinal soft tissue abnormality is identified. 3. Mild thoracic spondylosis. Most notably, a small T5-T6 right center disc protrusion contacts the ventral spinal cord. No significant central canal stenosis or neural foraminal narrowing at any level. 4. T2 hyperintense signal abnormality within the dependent aspect of the mid to lower lungs is nonspecific, but may reflect atelectasis. Consider a repeat chest radiograph if the patient has respiratory symptoms. Electronically Signed   By: Kellie Simmering DO   On: 02/17/2020 14:42   MR Lumbar Spine W Wo Contrast  Result Date: 02/17/2020 CLINICAL DATA:  Back pain, MSSA bacteremia, assess for vertebral osteomyelitis, discitis; sepsis. EXAM: MRI LUMBAR SPINE WITHOUT AND WITH CONTRAST TECHNIQUE: Multiplanar and multiecho pulse sequences of the lumbar spine were obtained without and with intravenous contrast. CONTRAST:  7.43mL GADAVIST GADOBUTROL 1 MMOL/ML IV SOLN COMPARISON:  Lumbar spine MRI 06/10/2019 FINDINGS: Multiple sequences are significantly motion degraded, limiting evaluation. Most notably  there is moderate to moderately severe motion degradation of the sagittal and axial postcontrast T1 weighted imaging. Segmentation:  5 lumbar vertebrae. Alignment: Lumbar dextrocurvature. 2 mm L1-L2 grade 1 retrolisthesis. Trace L3-L4 retrolisthesis. Trace L4-L5 anterolisthesis. Vertebrae: Vertebral body height is maintained. There is mild endplate edema at [id number].  There is also patchy edema and enhancement within the posterior elements. Most notably, there is edema and somewhat prominent enhancement within the articular pillars along the L2-L3 and L3-L4 right facet joints (for instance as seen on series 21, image 6). Conus medullaris and cauda equina: Conus extends to the L1-L2 level. No signal abnormality within the visualized distal spinal cord. Within described limitations, no definite abnormal enhancement identified along the visualized distal spinal cord or cauda equina nerve roots. Paraspinal and other soft tissues: No abnormality identified within included portions of the abdomen/retroperitoneum. Atrophy of the lumbar paraspinal musculature. No other definite paraspinal soft tissue abnormality identified. Disc levels: Unless otherwise stated, the level by level findings below have not significantly changed since prior MRI 06/10/2019. Moderate L1-L2 disc degeneration. Mild disc degeneration at the remaining levels. T12-L1: No disc herniation. No significant canal or foraminal stenosis. L1-L2: Mild grade 1 retrolisthesis. Small disc bulge. No significant spinal canal or neural foraminal narrowing. L2-L3: Mild facet arthrosis. No disc herniation. No significant canal or foraminal stenosis. L3-L4: Trace retrolisthesis. Disc bulge. The disc bulge is slightly more prominent within the right subarticular zone. Mild facet arthrosis/ligamentum flavum hypertrophy. New from prior exam, there is mild right subarticular narrowing with slight crowding of the descending right L4 nerve root. Central canal patent. Mild left neural foraminal narrowing, progressed. L4-L5: Trace anterolisthesis. Disc uncovering with superimposed small central disc protrusion. Moderate facet arthrosis with mild ligamentum flavum hypertrophy. Trace fluid within the bilateral facet joints. Mild bilateral subarticular and central canal narrowing without frank nerve root impingement. No significant foraminal  stenosis. L5-S1: Small central disc protrusion. Moderate facet arthrosis. No significant spinal canal stenosis or neural foraminal narrowing. IMPRESSION: 1. Examination limited by significant motion degradation. 2. Mild endplate edema and enhancement at L3-L4 may be degenerative. 3. Patchy edema and enhancement within the lumbar posterior elements. Most notably there is edema and somewhat prominent enhancement within the articular pillars along the right L2-L3 and L3-L4 facet joints. There is significant facet arthrosis at these levels and findings may be degenerative. Infection cannot be definitively excluded, although there are no other findings at this level to suggest this on the current exam (i.e. sizable facet joint effusion). 4. L3-L4 spondylosis has slightly progressed from 06/10/2019. There is now mild right subarticular stenosis at this level with slight crowding of the descending right L4 nerve root. Mild left neural foraminal narrowing at this level has also progressed. 5. Lumbar spondylosis is otherwise unchanged. Electronically Signed   By: Kellie Simmering DO   On: 02/17/2020 15:16   ECHOCARDIOGRAM COMPLETE  Result Date: 02/17/2020    ECHOCARDIOGRAM REPORT   Patient Name:   RENNA DEMARK Date of Exam: 02/17/2020 Medical Rec #:  [id number]     Height:       67.0 in Accession #:    [id number]    Weight:       194.0 lb Date of Birth:  1946-12-28      BSA:          2.00 m Patient Age:    59 years      BP:           127/84 mmHg Patient Gender: F  HR:           107 bpm. Exam Location:  [facility] Procedure: 2D Echo, Cardiac Doppler and Color Doppler Indications:     bactermia 790.7  History:         Patient has prior history of Echocardiogram examinations, most                  recent 02/20/2019. COPD; Arrythmias:Atrial Fibrillation. Chest                  pain.  Sonographer:     Sherrie Sport RDCS (AE) Referring Phys:  [place] Diagnosing Phys: Ida Rogue MD  Sonographer Comments:  Technically difficult study due to poor echo windows, no apical window and no subcostal window. IMPRESSIONS  1. Left ventricular ejection fraction, by estimation, is 60 to 65%. The left ventricle has normal function. The left ventricle has no regional wall motion abnormalities. Left ventricular diastolic parameters are indeterminate.  2. Right ventricular systolic function is normal. The right ventricular size is normal.  3. No valve vegetation noted FINDINGS  Left Ventricle: Left ventricular ejection fraction, by estimation, is 60 to 65%. The left ventricle has normal function. The left ventricle has no regional wall motion abnormalities. The left ventricular internal cavity size was normal in size. There is  no left ventricular hypertrophy. Left ventricular diastolic parameters are indeterminate. Right Ventricle: The right ventricular size is normal. No increase in right ventricular wall thickness. Right ventricular systolic function is normal. Left Atrium: Left atrial size was normal in size. Right Atrium: Right atrial size was normal in size. Pericardium: There is no evidence of pericardial effusion. Mitral Valve: The mitral valve is normal in structure and function. Normal mobility of the mitral valve leaflets. No evidence of mitral valve regurgitation. No evidence of mitral valve stenosis. Tricuspid Valve: The tricuspid valve is normal in structure. Tricuspid valve regurgitation is not demonstrated. No evidence of tricuspid stenosis. Aortic Valve: The aortic valve is normal in structure and function. Aortic valve regurgitation is not visualized. Mild to moderate aortic valve sclerosis/calcification is present, without any evidence of aortic stenosis. Pulmonic Valve: The pulmonic valve was normal in structure. Pulmonic valve regurgitation is not visualized. No evidence of pulmonic stenosis. Aorta: The aortic root is normal in size and structure. Venous: The inferior vena cava is normal in size with greater than  50% respiratory variability, suggesting right atrial pressure of 3 mmHg. IAS/Shunts: No atrial level shunt detected by color flow Doppler.  LEFT VENTRICLE PLAX 2D LVIDd:         4.31 cm LVIDs:         3.06 cm LV PW:         1.19 cm LV IVS:        1.49 cm LVOT diam:     2.00 cm LV SV Index:   22.65 LVOT Area:     3.14 cm  LEFT ATRIUM         Index LA diam:    3.80 cm 1.90 cm/m                        PULMONIC VALVE AORTA                 RVOT Peak grad: 2 mmHg Ao Root diam: 3.00 cm   SHUNTS Systemic Diam: 2.00 cm Ida Rogue MD Electronically signed by Ida Rogue MD Signature Date/Time: 02/17/2020/3:57:33 PM    Final  Assessment and Plan:   MSSA Bacteremia, Sepsis --MSSA Bacteremia and septic on admission. Patient is alert and oriented this morning. NPO in preparation for procedure. Afebrile and alert and oriented this AM with correct answers supplied for current full name, birthdate, current date, current location, and current president.  Consented for transesophageal echocardiogram to rule out endocarditis secondary to bacteremia.  Please find documentation of consent in previous progress note completed today. TTE without vegetation and nl EF. TEE scheduled for today. MRI without discitis. Continue abx per ID.  Permanent atrial fibrillation on chronic anticoagulation --Currently asx in atrial fibrillation. Denies racing HR or palpitations. S/p multiple attempts at restoration of NSR as above and in HPI. PTA rate control with propranolol held at admission and should be restarted before discharge but once patient on telemetry (not on telemetry at this time) and are able to trend ventricular rates. Continue Eliquis 5 mg twice daily. No reported signs or symptoms consistent with bleeding with h/o GIB as below.  HFpEF --Euvolemic and well compensated.  Most recent echo above with nl EF. No lower extremity edema on exam.  She reportedly takes her PTA Lasix as needed approximately every 2 weeks. Not  currently on a diuretic 2/2 sepsis. She denies any changes in diet.  COPD --Reports shortness of breath and dyspnea on exertion at baseline.  Currently on 2 L nasal cannula.  Reports she does not use oxygen at home. Continue to monitor breathing status.  Anxiety --Anxious at baseline.  She states this morning that she is anxious about the procedure but seems assured once that she discovers that she will be sedated.  History of GI bleed --No evidence of recurrence.  Denies current signs and symptoms of bleeding on exam this morning.  Continue anticoagulation in the setting of atrial fibrillation.    For questions or updates, please contact [place] Please consult [email or web address] for contact info under     Signed, Arvil Chaco, PA-C  02/21/2020 8:30 AM

## 2020-02-21 NOTE — Progress Notes (Signed)
ID Pt doing better But still has back pain Still intermittently confused  O/e awake and alert- orinete din place, person, year, month- intermittent confusion Patient Vitals for the past 24 hrs:  BP Temp Temp src Pulse Resp SpO2 Height Weight  02/21/20 1843 (!) 139/98 98.1 F (36.7 C) Oral 99 20 93 % -- --  02/21/20 1521 (!) 168/116 98.6 F (37 C) Oral (!) 115 18 97 % -- --  02/21/20 1304 (!) 145/128 98 F (36.7 C) Oral (!) 113 20 94 % -- --  02/21/20 1109 (!) 152/112 -- -- (!) 112 19 93 % -- --  02/21/20 0930 (!) 150/107 98.8 F (37.1 C) Oral (!) 110 (!) 23 94 % 5\' 7"  (1.702 m) 88 kg  02/21/20 0358 -- -- -- 97 -- -- -- --  02/21/20 0356 (!) 131/91 98.9 F (37.2 C) Oral (!) 112 20 94 % -- --  02/20/20 2327 -- -- -- -- 20 -- -- --  02/20/20 2044 -- -- -- 95 -- -- -- --  02/20/20 2036 112/68 98.5 F (36.9 C) Oral (!) 105 (!) 22 96 % -- --    Chest b/l air entry- crepts bases Hs irregular Ad soft CNS non focal  Labs CBC Latest Ref Rng & Units 02/20/2020 02/18/2020 02/17/2020  WBC 4.0 - 10.5 K/uL 7.0 6.0 6.7  Hemoglobin 12.0 - 15.0 g/dL 9.5(L) 9.8(L) 9.7(L)  Hematocrit 36.0 - 46.0 % 31.4(L) 32.4(L) 32.3(L)  Platelets 150 - 400 K/uL 177 141(L) 120(L)    CMP Latest Ref Rng & Units 02/20/2020 02/18/2020 02/17/2020  Glucose 70 - 99 mg/dL 100(H) 83 90  BUN 8 - 23 mg/dL 20 18 20   Creatinine 0.44 - 1.00 mg/dL 0.77 0.80 0.79  Sodium 135 - 145 mmol/L 134(L) 136 135  Potassium 3.5 - 5.1 mmol/L 3.6 3.7 3.5  Chloride 98 - 111 mmol/L 99 104 106  CO2 22 - 32 mmol/L 26 24 22   Calcium 8.9 - 10.3 mg/dL 8.7(L) 8.5(L) 7.7(L)  Total Protein 6.5 - 8.1 g/dL - - -  Total Bilirubin 0.3 - 1.2 mg/dL - - -  Alkaline Phos 38 - 126 U/L - - -  AST 15 - 41 U/L - - -  ALT 0 - 44 U/L - - -     BC 2/17 BC MSSA 2/20 BC Ng  2 d echo no vegetatiton MRI thoracic/lumbar spine without contrast no infection  Impression/recommendation  MSSA bacteremia- unclear etiology, reeat blood culture negative MRI  spine without contrast did not show any infection TEE could not be done today because of her resp status On cefazolin- will need for a minimum of 4 - 6 weeks- PICC can be placed  No hardware Immune-compromised die to rheumatoid arthritis and on plaquenil and TNFI   Afib on diltiazem  Encephalopathy -improving Discussed the management with the care team

## 2020-02-21 NOTE — Progress Notes (Signed)
    [facility] has been requested to perform a transesophageal echocardiogram on Ms. Stonebraker for bacteremia.  After careful review of history and examination, the risks and benefits of transesophageal echocardiogram have been explained including risks of esophageal damage, perforation (1:10,000 risk), bleeding, pharyngeal hematoma as well as other potential complications associated with conscious sedation including aspiration, arrhythmia, respiratory failure and death. Alternatives to treatment were discussed, questions were answered. Patient is willing to proceed.   Arvil Chaco, PA-C 02/21/2020 8:23 AM

## 2020-02-21 NOTE — Progress Notes (Signed)
BP 168/116 and HR 115. MD notified at 15:44 and cardizem ordered. Follow-up BP 139/98 with HR 99.

## 2020-02-21 NOTE — Progress Notes (Signed)
[facility] [place] NAME: Brandy Armstrong    MR#:  [id number]  DATE OF BIRTH:  11-16-1946  SUBJECTIVE:  patient's TEE was canceled because she started having wheezing and continued it despite breathing treatment. Has. Serve mild confusion. Answered most questions appropriately. Denies any shortness of breath at present. Not feeling hungry today  REVIEW OF SYSTEMS:   Review of Systems  Constitutional: Negative for chills, fever and weight loss.  HENT: Negative for ear discharge, ear pain and nosebleeds.   Eyes: Negative for blurred vision, pain and discharge.  Respiratory: Negative for sputum production, shortness of breath, wheezing and stridor.   Cardiovascular: Negative for chest pain, palpitations, orthopnea and PND.  Gastrointestinal: Negative for abdominal pain, diarrhea, nausea and vomiting.  Genitourinary: Negative for frequency and urgency.  Musculoskeletal: Negative for back pain and joint pain.  Neurological: Negative for sensory change, speech change, focal weakness and weakness.  Psychiatric/Behavioral: Negative for depression and hallucinations. The patient is not nervous/anxious.    Tolerating Diet:not hungry Tolerating PT: SNF  DRUG ALLERGIES:   Allergies  Allergen Reactions  . Latex     Itching Itching    VITALS:  Blood pressure (!) 168/116, pulse (!) 115, temperature 98.6 F (37 C), temperature source Oral, resp. rate 18, height 5\' 7"  (1.702 m), weight 88 kg, SpO2 97 %.  PHYSICAL EXAMINATION:   Physical Exam  GENERAL:  74 y.o.-year-old patient lying in the bed with no acute distress.  EYES: Pupils equal, round, reactive to light and accommodation. No scleral icterus.   HEENT: Head atraumatic, normocephalic. Oropharynx and nasopharynx clear.  NECK:  Supple, no jugular venous distention. No thyroid enlargement, no tenderness.  LUNGS: Normal breath sounds bilaterally, no wheezing, rales, rhonchi. No use of accessory  muscles of respiration.  CARDIOVASCULAR: S1, S2 normal. No murmurs, rubs, or gallops. Tachycardia, mild ABDOMEN: Soft, nontender, nondistended. Bowel sounds present. No organomegaly or mass.  EXTREMITIES: No cyanosis, clubbing or edema b/l.    NEUROLOGIC: Cranial nerves II through XII are intact. No focal Motor or sensory deficits b/l.   PSYCHIATRIC:  patient is alert and oriented x 3. Mild anxious SKIN: No obvious rash, lesion, or ulcer.   LABORATORY PANEL:  CBC Recent Labs  Lab 02/20/20 0416  WBC 7.0  HGB 9.5*  HCT 31.4*  PLT 177    Chemistries  Recent Labs  Lab 02/16/20 1052 02/17/20 0507 02/20/20 0416  NA 130*   < > 134*  K 4.0   < > 3.6  CL 98   < > 99  CO2 20*   < > 26  GLUCOSE 114*   < > 100*  BUN 30*   < > 20  CREATININE 1.13*   < > 0.77  CALCIUM 9.1   < > 8.7*  AST 21  --   --   ALT 9  --   --   ALKPHOS 82  --   --   BILITOT 1.3*  --   --    < > = values in this interval not displayed.   Cardiac Enzymes No results for input(s): TROPONINI in the last 168 hours. RADIOLOGY:  No results found. ASSESSMENT AND PLAN:  74 year old woman multiple medical problems outlined below, presented with confusion for approximately 48 hours, subjective chills, right flank pain, fever, dysuria.  Admitted for sepsis secondary to UTI, acute pyelonephritis  MSSA bacteremia 2/2 bottles, source unclear.  - 2D echocardiogram reassuring.  - MRI thoracic and  lumbar spine did not show any clear evidence of infection/discitis --Remains afebrile.  Mentation appears to be at baseline--occ confusion --Continue cefazolin, follow-up blood cultures, hopefully can place PICC line in the next 48 hours if cultures remain negative.  Appreciate infectious disease recommendations. --Plan for TEE 2/23 once breathing is stable  Sepsis secondary to MSSA bacteremia, possible UTI, possible acute pyelonephritis.  Sepsis on admission resolved now.  Urine culture unrevealing. --Sepsis resolved.   Continue antibiotics.  Thoracic back pain.  Secondary to degenerative disease as seen on MRI of lumbar and thoracic spine. --Continue Tylenol and tramadol.   --Could challenge with a lower dose of pain is uncontrolled with tramadol. --Continue PT  Acute hypoxic respiratory failure, PMH includes COPD. Given her history of COPD, acute illness has unmasked occult hypoxia.  Continue home inhaler -- patient has significant wheezing when she was down to get her TEE done. Will give her Solu-Medrol 60 mg IV BID and duo nebs round-the-clock. -Oxygen as needed  Chronic normocytic anemia, per oncology note iron deficiency, due to chronic blood loss complicated by chronic anticoagulation.  Appears to be close to baseline.  Followed by hematology as an outpatient. No history of bleeding.  Hemoglobin stable.  No further inpatient evaluation suggested.  Atrial fibrillation on chronic anticoagulation --Intermittent mild tachycardia.  Continue propranolol (on for tremors apparently).  Asymptomatic. --Continue apixaban -- patient remains tachycardic will add Cardizem 30 mg Q6 hourly  Rheumatoid arthritis on methotrexate and Arentz. --Hold medications this admission.  Depression --Continue BuSpar, Zoloft --Will restart trazodone  Pancreatic insufficiency, chronic --Continue Creon          Obesity unspecified --Continue Ensure per dietitian  Disposition Plan: From home.  At this point will need short-term SNF as she is far below functional status at baseline, lives alone, mobility is hampered by back pain and acute infection/illness.  She is on IV antibiotics for bacteremia and awaiting repeat cultures in order to place PICC line, TEE and completion of evaluation.   DVT prophylaxis: apixaban Code Status: Full Family Communication:  [name] by telephone  TOTAL TIME TAKING CARE OF THIS PATIENT: **25* minutes.  >50% time spent on counselling and coordination of care  Note: This dictation  was prepared with Dragon dictation along with smaller phrase technology. Any transcriptional errors that result from this process are unintentional.  Fritzi Mandes M.D    [facility]   CC: Primary care physician; [name], FNPPatient ID: Brandy Armstrong, female   DOB: January 03, 1946, 74 y.o.   MRN: [id number]

## 2020-02-21 NOTE — NC FL2 (Signed)
[place] LEVEL OF CARE SCREENING TOOL     IDENTIFICATION  Patient Name: Brandy Armstrong Birthdate: 04/21/1946 Sex: female Admission Date (Current Location): 02/16/2020  [place] and [place] Number:  Engineering geologist and Address:  [facility], [address] [place], [place] [address]      Provider Number: [id number]  Attending Physician Name and Address:  Fritzi Mandes, MD  Relative Name and Phone Number:  Irma [phone]    Current Level of Care: SNF Recommended Level of Care: [place] Prior Approval Number:    Date Approved/Denied:   PASRR Number: [id number]  Discharge Plan: SNF    Current Diagnoses: Patient Active Problem List   Diagnosis Date Noted  . MSSA bacteremia 02/17/2020  . Pyelonephritis 02/17/2020  . Acute hypoxemic respiratory failure ([place]) 02/17/2020  . Acute encephalopathy 02/17/2020  . Thrombocytopenia ([place]) 02/17/2020  . Sepsis ([place]) 02/16/2020  . Acute lower UTI 02/16/2020  . Toxic metabolic encephalopathy   . Pancreatic insufficiency 01/17/2020  . Pancreatic divisum 01/17/2020  . 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 MDD (major depressive disorder), recurrent episode, moderate ([place]) 08/18/2019  . GAD (generalized anxiety disorder) 08/18/2019  . Panic attacks 08/18/2019  . Cognitive disorder 08/18/2019  . Chest pain 07/09/2019  . Greater trochanteric bursitis of left hip 05/21/2019  . Anesthesia of skin 05/10/2019  . Loose stools 04/07/2019  . Anxiety 04/02/2019  . Appendicitis with perforation 03/09/2019  . Acute appendicitis   . Acute appendicitis with localized peritonitis 02/19/2019  . Chronic diastolic CHF (congestive heart failure) ([place]) 02/10/2019  . Atherosclerosis of aorta ([place]) 02/05/2019  . Bronchitis 12/28/2018  . AAA (abdominal aortic aneurysm) without rupture ([place]) 12/17/2018  . COPD exacerbation ([place]) 12/09/2018  . Iron deficiency  anemia due to chronic blood loss 11/03/2018  . Obesity, Class II, BMI 35-39.9 09/23/2018  . Primary osteoarthritis of right knee 09/23/2018  . GERD (gastroesophageal reflux disease) 08/24/2018  . Hives 06/17/2018  . B12 deficiency 06/17/2018  . Anxiety and depression 03/23/2018  . Purpura ([place]) 08/06/2017  . Radicular pain in left arm 09/30/2016  . Cellulitis 09/26/2016  . Abscess 09/24/2016  . PAD (peripheral artery disease) ([place]) 08/13/2016  . Anemia 08/13/2016  . Urinary urgency 08/06/2016  . Thoracic back pain 05/20/2016  . Neuropathy 10/05/2015  . Tremors of nervous system 06/18/2015  . GI bleed due to NSAIDs 05/04/2015  . Dysuria 03/07/2015  . Atrial fibrillation with RVR ([place]) 03/11/2014  . COPD (chronic obstructive pulmonary disease) ([place]) 03/11/2014  . Flexor hallucis longus tendinitis 11/09/2013  . Vertigo 09/16/2013  . Chronic steroid use 09/11/2013  . Osteoarthritis 09/11/2013  . Shortness of breath 08/18/2013  . Generalized weakness 08/18/2013  . Barrett's esophagus 08/18/2013  . Chronic diarrhea 08/18/2013  . Numbness and tingling 08/18/2013  . Screening for breast cancer 08/18/2013  . Rheumatoid arthritis ([place]) 05/31/2013  . Seborrheic keratosis 12/14/2012  . Benign neoplasm of colon 11/23/2012  . Family history of malignant neoplasm of gastrointestinal tract 11/23/2012  . Prediabetes 12/16/2011    Orientation RESPIRATION BLADDER Height & Weight     (fluctuating)  O2(Nasal cannula 2L) Continent, External catheter Weight: 194 lb 0.1 oz (88 kg)(per chart) Height:  5\' 7"  (170.2 cm)(per chart)  BEHAVIORAL SYMPTOMS/MOOD NEUROLOGICAL BOWEL NUTRITION STATUS    (none) Continent Diet(heart healthy diet)  AMBULATORY STATUS COMMUNICATION OF NEEDS Skin   Limited Assist Verbally Bruising  Personal Care Assistance Level of Assistance  Bathing, Dressing, Feeding Bathing Assistance: Maximum assistance Feeding assistance: Limited  assistance Dressing Assistance: Maximum assistance     Functional Limitations Info  Speech, Hearing, Sight Sight Info: Adequate Hearing Info: Adequate Speech Info: Adequate    SPECIAL CARE FACTORS FREQUENCY  PT (By licensed PT), OT (By licensed OT)     PT Frequency: 5 times/week OT Frequency: 5 times/week            Contractures Contractures Info: Not present    Additional Factors Info  Code Status, Allergies Code Status Info: Full Code Allergies Info: Latex           Current Medications (02/21/2020):  This is the current hospital active medication list Current Facility-Administered Medications  Medication Dose Route Frequency Provider Last Rate Last Admin  . acetaminophen (TYLENOL) tablet 650 mg  650 mg Oral Q6H PRN Dhungel, Nishant, MD   650 mg at 02/18/20 0845   Or  . acetaminophen (TYLENOL) suppository 650 mg  650 mg Rectal Q6H PRN Dhungel, Nishant, MD      . apixaban (ELIQUIS) tablet 5 mg  5 mg Oral BID Dhungel, Nishant, MD   5 mg at 02/21/20 1000  . bisacodyl (DULCOLAX) suppository 10 mg  10 mg Rectal Daily PRN [name], MD      . busPIRone (BUSPAR) tablet 10 mg  10 mg Oral TID Dhungel, Nishant, MD   10 mg at 02/21/20 1124  . ceFAZolin (ANCEF) IVPB 2g/100 mL premix  2 g Intravenous Q8H Hall, Scott A, RPH   Stopped at 02/21/20 0122  . feeding supplement (ENSURE ENLIVE) (ENSURE ENLIVE) liquid 237 mL  237 mL Oral BID BM [name], MD   237 mL at 02/21/20 1000  . fluticasone furoate-vilanterol (BREO ELLIPTA) 100-25 MCG/INH 1 puff  1 puff Inhalation Daily Rito Ehrlich A, RPH   1 puff at 02/21/20 1134   And  . umeclidinium bromide (INCRUSE ELLIPTA) 62.5 MCG/INH 1 puff  1 puff Inhalation Daily Rito Ehrlich A, RPH   1 puff at 02/21/20 1134  . gabapentin (NEURONTIN) tablet 600 mg  600 mg Oral TID Dhungel, Nishant, MD   600 mg at 02/21/20 1124  . hyoscyamine (LEVSIN) tablet 0.125 mg  0.125 mg Oral PRN Dhungel, Nishant, MD      . ipratropium-albuterol  (DUONEB) 0.5-2.5 (3) MG/3ML nebulizer solution 3 mL  3 mL Nebulization Q6H PRN Fritzi Mandes, MD      . lipase/protease/amylase (CREON) capsule 36,000 Units  36,000 Units Oral TID AC Dhungel, Nishant, MD   36,000 Units at 02/20/20 1804  . ondansetron (ZOFRAN) tablet 4 mg  4 mg Oral Q6H PRN Dhungel, Nishant, MD       Or  . ondansetron (ZOFRAN) injection 4 mg  4 mg Intravenous Q6H PRN Dhungel, Nishant, MD   4 mg at 02/16/20 1919  . pantoprazole (PROTONIX) EC tablet 40 mg  40 mg Oral Daily Dhungel, Nishant, MD   40 mg at 02/21/20 1124  . polyethylene glycol (MIRALAX / GLYCOLAX) packet 17 g  17 g Oral BID [name], MD   17 g at 02/21/20 1125  . propranolol ER (INDERAL LA) 24 hr capsule 120 mg  120 mg Oral Daily Dhungel, Nishant, MD   120 mg at 02/21/20 1125  . senna (SENOKOT) tablet 8.6 mg  1 tablet Oral QHS [name], MD   8.6 mg at 02/20/20 2145  . sertraline (ZOLOFT) tablet 50 mg  50 mg  Oral Daily Dhungel, Nishant, MD   50 mg at 02/21/20 1125  . traMADol (ULTRAM) tablet 50 mg  50 mg Oral Q6H PRN [name], MD      . traZODone (DESYREL) tablet 50 mg  50 mg Oral QHS [name], MD   50 mg at 02/20/20 2145     Discharge Medications: Please see discharge summary for a list of discharge medications.  Relevant Imaging Results:  Relevant Lab Results:   Additional Ellis Leanor Voris, LCSWA

## 2020-02-22 ENCOUNTER — Inpatient Hospital Stay: Payer: Medicare Other

## 2020-02-22 DIAGNOSIS — I4821 Permanent atrial fibrillation: Secondary | ICD-10-CM

## 2020-02-22 LAB — HEMOGLOBIN A1C
Hgb A1c MFr Bld: 5.2 % (ref 4.8–5.6)
Mean Plasma Glucose: 102.54 mg/dL

## 2020-02-22 LAB — BRAIN NATRIURETIC PEPTIDE: B Natriuretic Peptide: 519 pg/mL — ABNORMAL HIGH (ref 0.0–100.0)

## 2020-02-22 LAB — GLUCOSE, CAPILLARY
Glucose-Capillary: 135 mg/dL — ABNORMAL HIGH (ref 70–99)
Glucose-Capillary: 151 mg/dL — ABNORMAL HIGH (ref 70–99)
Glucose-Capillary: 156 mg/dL — ABNORMAL HIGH (ref 70–99)

## 2020-02-22 MED ORDER — INSULIN ASPART 100 UNIT/ML ~~LOC~~ SOLN: 0.0000 [IU] | Freq: Every day | SUBCUTANEOUS | Status: DC

## 2020-02-22 MED ORDER — METOPROLOL TARTRATE 25 MG PO TABS
25.0000 mg | ORAL_TABLET | Freq: Two times a day (BID) | ORAL | Status: DC
  Administered 2020-02-23: 25 mg via ORAL
  Filled 2020-02-22: qty 1

## 2020-02-22 MED ORDER — ALBUMIN HUMAN 25 % IV SOLN
12.5000 g | Freq: Every day | INTRAVENOUS | Status: DC
  Administered 2020-02-22: 15:00:00 12.5 g via INTRAVENOUS
  Filled 2020-02-22 (×2): qty 50

## 2020-02-22 MED ORDER — INSULIN ASPART 100 UNIT/ML ~~LOC~~ SOLN
0.0000 [IU] | Freq: Three times a day (TID) | SUBCUTANEOUS | Status: DC
  Administered 2020-02-22: 18:00:00 2 [IU] via SUBCUTANEOUS
  Administered 2020-02-22: 1 [IU] via SUBCUTANEOUS
  Filled 2020-02-22 (×2): qty 1

## 2020-02-22 MED ORDER — FUROSEMIDE 10 MG/ML IJ SOLN
40.0000 mg | Freq: Once | INTRAMUSCULAR | Status: AC
  Administered 2020-02-22: 13:00:00 40 mg via INTRAVENOUS
  Filled 2020-02-22: qty 4

## 2020-02-22 MED ORDER — IPRATROPIUM-ALBUTEROL 0.5-2.5 (3) MG/3ML IN SOLN
3.0000 mL | RESPIRATORY_TRACT | Status: DC
  Administered 2020-02-22 – 2020-02-23 (×2): 3 mL via RESPIRATORY_TRACT
  Filled 2020-02-22 (×4): qty 3

## 2020-02-22 NOTE — Progress Notes (Signed)
Nutrition Follow Up Note   DOCUMENTATION CODES:   Obesity unspecified  INTERVENTION:   Ensure Enlive po BID, each supplement provides 350 kcal and 20 grams of protein  NUTRITION DIAGNOSIS:   Increased nutrient needs related to acute illness as evidenced by increased estimated needs.  GOAL:   Patient will meet greater than or equal to 90% of their needs  -progressing   MONITOR:   PO intake, Supplement acceptance, Labs, Weight trends, Skin, I & O's  ASSESSMENT:   74 y.o. female, with history of A. fib on anticoagulation, chronic pancreatic insufficiency on creon, chronic back pain, COPD, rheumatoid arthritis on methotrexate and Arentz and MDD who was brought with confusion and found to have bacteremia and sepsis   Pt with fairly good appetite and oral intake in hospital; pt eating anywhere from 30-100% of meals and drinking Ensure supplements. Per MD note, pt with decreased appetite yesterday. Per chart, pt appears weight stable since admit. No BM noted since 2/17; pt initiated on bowel regimen. Recommend continue supplements until pt's appetite has returned to normal.   Medications reviewed and include: creon, solu-medrol, protonix, miralax, senokot, cefazolin   Labs reviewed: Na 134(L) Hgb 9.5(L), Hct 31.4(L)  Diet Order:   Diet Order            Diet NPO time specified  Diet effective midnight        Diet Heart Room service appropriate? Yes; Fluid consistency: Thin  Diet effective now             EDUCATION NEEDS:   No education needs have been identified at this time  Skin:  Skin Assessment: Reviewed RN Assessment(ecchymosis)  Last BM:  2/17  Height:   Ht Readings from Last 1 Encounters:  02/21/20 5\' 7"  (1.702 m)    Weight:   Wt Readings from Last 1 Encounters:  02/21/20 88 kg    Ideal Body Weight:  61.3 kg  BMI:  Body mass index is 30.39 kg/m.  Estimated Nutritional Needs:   Kcal:  1700-2000kcal/day  Protein:  85-100g/day  Fluid:   >1.5L/day  Koleen Distance MS, RD, LDN Contact information available in Amion

## 2020-02-22 NOTE — Progress Notes (Addendum)
[facility] [place] NAME: Brandy Armstrong    MR#:  [id number]  DATE OF BIRTH:  25-Mar-1946  SUBJECTIVE:  patient wondering when she'll get her TEE breathing comfortably. Earlier was on 3 L nasal cannula. Currently doing my evaluation on 2 L. Not wheezing.  REVIEW OF SYSTEMS:   Review of Systems  Constitutional: Negative for chills, fever and weight loss.  HENT: Negative for ear discharge, ear pain and nosebleeds.   Eyes: Negative for blurred vision, pain and discharge.  Respiratory: Positive for shortness of breath. Negative for sputum production, wheezing and stridor.   Cardiovascular: Negative for chest pain, palpitations, orthopnea and PND.  Gastrointestinal: Negative for abdominal pain, diarrhea, nausea and vomiting.  Genitourinary: Negative for frequency and urgency.  Musculoskeletal: Negative for back pain and joint pain.  Neurological: Negative for sensory change, speech change, focal weakness and weakness.  Psychiatric/Behavioral: Negative for depression and hallucinations. The patient is not nervous/anxious.    Tolerating Diet:yes Tolerating PT: SNF  DRUG ALLERGIES:   Allergies  Allergen Reactions  . Latex     Itching Itching    VITALS:  Blood pressure (!) 168/106, pulse (!) 111, temperature 98.2 F (36.8 C), temperature source Oral, resp. rate 20, height 5\' 7"  (1.702 m), weight 88 kg, SpO2 96 %.  PHYSICAL EXAMINATION:   Physical Exam  GENERAL:  74 y.o.-year-old patient lying in the bed with no acute distress.  EYES: Pupils equal, round, reactive to light and accommodation. No scleral icterus.   HEENT: Head atraumatic, normocephalic. Oropharynx and nasopharynx clear.  NECK:  Supple, no jugular venous distention. No thyroid enlargement, no tenderness.  LUNGS: Normal breath sounds bilaterally, no wheezing, rales, rhonchi. No use of accessory muscles of respiration.  CARDIOVASCULAR: S1, S2 normal. No murmurs, rubs, or  gallops. Tachycardia, mild ABDOMEN: Soft, nontender, nondistended. Bowel sounds present. No organomegaly or mass.  EXTREMITIES: No cyanosis, clubbing or edema b/l.    NEUROLOGIC: Cranial nerves II through XII are intact. No focal Motor or sensory deficits b/l.   PSYCHIATRIC:  patient is alert and oriented x 3. Mild anxious SKIN: No obvious rash, lesion, or ulcer.   LABORATORY PANEL:  CBC Recent Labs  Lab 02/20/20 0416  WBC 7.0  HGB 9.5*  HCT 31.4*  PLT 177    Chemistries  Recent Labs  Lab 02/16/20 1052 02/17/20 0507 02/20/20 0416  NA 130*   < > 134*  K 4.0   < > 3.6  CL 98   < > 99  CO2 20*   < > 26  GLUCOSE 114*   < > 100*  BUN 30*   < > 20  CREATININE 1.13*   < > 0.77  CALCIUM 9.1   < > 8.7*  AST 21  --   --   ALT 9  --   --   ALKPHOS 82  --   --   BILITOT 1.3*  --   --    < > = values in this interval not displayed.   Cardiac Enzymes No results for input(s): TROPONINI in the last 168 hours. RADIOLOGY:  DG Chest Port 1 View  Result Date: 02/22/2020 CLINICAL DATA:  Shortness of breath, cough EXAM: PORTABLE CHEST 1 VIEW COMPARISON:  02/16/2020 FINDINGS: Trace right pleural effusion. Bilateral mild interstitial thickening, right greater than left. No pneumothorax. Stable cardiomediastinal silhouette. No aggressive osseous lesion. IMPRESSION: Small right pleural effusion. Mild right basilar atelectasis. Bilateral interstitial thickening right greater than left  which may reflect mild interstitial edema. Electronically Signed   By: Kathreen Devoid   On: 02/22/2020 10:40   ASSESSMENT AND PLAN:  74 year old woman multiple medical problems outlined below, presented with confusion for approximately 48 hours, subjective chills, right flank pain, fever, dysuria.  Admitted for sepsis secondary to UTI, acute pyelonephritis  MSSA bacteremia/ sepsis secondary to MSSA bacteremia, unclear etiology - 2D echocardiogram reassuring.  - MRI thoracic and lumbar spine did not show any clear  evidence of infection/discitis --Remains afebrile.  Mentation appears to be at baseline--occ confusion --Continue cefazolin, follow-up blood cultures are negative in 3 days, hopefully can place PICC line  Tomorrow - Appreciate infectious disease recommendations. --Plan for TEE 2/24 once breathing is stable -UC --multiple species  Thoracic back pain.  Secondary to degenerative disease as seen on MRI of lumbar and thoracic spine. --Continue Tylenol and tramadol.   --Could challenge with a lower dose of pain is uncontrolled with tramadol. --Continue PT  Acute hypoxic respiratory failure due to COPD  Flare with acute on chronic HFpEF - Given her history of COPD, acute illness has unmasked occult hypoxia.  - Continue home inhaler -- cont Solu-Medrol 60 mg IV BID and duo nebs round-the-clock. - BNP 516--give Lasix 40 mg x1 -CXR today showed some congestion -Pulmonary Dr Lanney Gins to see pt  Chronic normocytic anemia, per oncology note iron deficiency, due to chronic blood loss complicated by chronic anticoagulation.  Appears to be close to baseline.  Followed by hematology as an outpatient. No history of bleeding.  Hemoglobin stable.  No further inpatient evaluation suggested.  Atrial fibrillation on chronic anticoagulation --Intermittent mild tachycardia.  Continue propranolol (on for tremors apparently).  Asymptomatic. --Continue apixaban -- patient remains tachycardic will add Cardizem 30 mg Q6 hourly--change to Cardizem CD at discharge  Rheumatoid arthritis on methotrexate and Arentz. --Hold medications this admission.  Depression --Continue BuSpar, Zoloft --Will restart trazodone  Pancreatic insufficiency, chronic --Continue Creon          Obesity unspecified --Continue Ensure per dietitian  Disposition Plan: From home.   At this point will need short-term SNF as she is far below functional status at baseline, lives alone, mobility is hampered by back pain and acute  infection/illness.  She is on IV antibiotics for bacteremia and awaiting  PICC line, TEE and completion of evaluation.   DVT prophylaxis: apixaban Code Status: Full Family Communication:  [name] by telephone  TOTAL TIME TAKING CARE OF THIS PATIENT: **25* minutes.  >50% time spent on counselling and coordination of care  Note: This dictation was prepared with Dragon dictation along with smaller phrase technology. Any transcriptional errors that result from this process are unintentional.  Fritzi Mandes M.D    [facility]   CC: Primary care physician; [name], FNPPatient ID: Brandy Armstrong, female   DOB: Oct 25, 1946, 74 y.o.   MRN: [id number]

## 2020-02-22 NOTE — TOC Progression Note (Addendum)
Transition of Care [facility]) - Progression Note    Patient Details  Name: Brandy Armstrong MRN: [id number] Date of Birth: 29-Aug-1946  Transition of Care [facility]) CM/SW [place], [place] Phone Number: 02/22/2020, 9:14 AM  Clinical Narrative:   Patient's HCPOA stated their first choice would be [facility] for rehab. Confirmed patient has had both COVID 19 vaccines. CSW called Seth Bake at [facility]. Seth Bake stated she will review patient's information and let CSW know.  9:50- Spoke with Seth Bake at [facility] who said they can accept patient upon discharge. Faxed confirmation of patient's COVID 19 vaccine records. Updated patient's HCPOA Irma Newness.   11:45- Spoke with Seth Bake who said they cannot take patient due to being on IV antibiotics. Spoke with MDs who confirmed patient will definitely need IV antibiotics. Followed up with [facility] and [facility]) who extended bed offers to confirm they can take patient with IV antibiotics. [facility] confirmed they can take patient with IV antibiotics. Updated [employer].  1:30- Spoke with Olivia Mackie from [facility] who reported it will depend on which IV antibiotics patient is prescribed.   1:45- Faxed clinicals to [facility] for Insurance Authorization Review. Will call them with facility choice.        Expected Discharge Plan and Services                                                 Social Determinants of Health (SDOH) Interventions    Readmission Risk Interventions No flowsheet data found.

## 2020-02-22 NOTE — Care Management Important Message (Signed)
Important Message  Patient Details  Name: Brandy Armstrong MRN: [id number] Date of Birth: 1946/04/23   Medicare Important Message Given:  Yes     Dannette Barbara 02/22/2020, 8:25 AM

## 2020-02-22 NOTE — Progress Notes (Deleted)
Virtual Visit via Telephone Note   This visit type was conducted due to national recommendations for restrictions regarding the COVID-19 Pandemic (e.g. social distancing) in an effort to limit this patient's exposure and mitigate transmission in our community.  Due to her co-morbid illnesses, this patient is at least at moderate risk for complications without adequate follow up.  This format is felt to be most appropriate for this patient at this time.  The patient did not have access to video technology/had technical difficulties with video requiring transitioning to audio format only (telephone).  All issues noted in this document were discussed and addressed.  No physical exam could be performed with this format.  Please refer to the patient's chart for her  consent to telehealth for [facility].   I connected with  Brandy Armstrong on 02/22/20 by a video enabled telemedicine application and verified that I am speaking with the correct person using two identifiers. I discussed the limitations of evaluation and management by telemedicine. The patient expressed understanding and agreed to proceed.   Evaluation Performed:  Follow-up visit  Date:  02/22/2020   ID:  Brandy Armstrong, Brandy Armstrong 1946-10-21, MRN [id number]  Patient Location:  [address] [place] [address]   Provider location:   [facility], [place] office  PCP:  [name], [place]  Cardiologist:  E[place], [place]  Chief Complaint:  SOB, leg swelling   History of Present Illness:    Brandy Armstrong is a 74 y.o. female who presents via audio/video conferencing for a telehealth visit today.   The patient does not symptoms concerning for COVID-19 infection (fever, chills, cough, or new SHORTNESS OF BREATH).   Patient has a past medical history of  COPD, smoked quit 2012  Depression neuropathy  paroxysmal Atrial fibrillation Normal EF >55% in 11/2016 at [facility] s/p ablation 2013 and 2016 and had  recurrence. No further bleeding on apixaban. Previously seen by cardiology at [facility], Last seen February 2018 Appendix in  Who presents for follow-up of her permanent atrial fibrillation  Tele visit 04/19/2019 Recommend she increase Lasix up to 20 twice daily for 3 days then down to 20 daily Lab work reviewed, recent normal BMP We will talk with her again next week Recommend she avoid excessive fluid intake Suspect goal weight will be close to 200  Some around the ankles , "just a little" Current weight 208 Taking lasix 20 mg daily   Lab work reviewed with her Anemia better, HBG 12.7 On iron infusion Normal creatinine  Was previously on metoprolol, changed to propranolol for tremor  Other past medical history reviewed  AF ablation by Dr. Isaias Sakai in early 2013  recurrence when sotalol was stopped.  repeat procedure on 02/21/15.  required DCCV on 03/21/15.  Recurrence of her atrial fibrillation and change to amiodarone gastrointestinal bleeding and xarelto was stopped and aspirin started.  She has since been put on apixaban DCCV 08/2016 for recurrent AF with restoration of sinus rhythm. recurrence of AF about a month later. amiodarone has been discontinued  Previous discussions concerning rate-control, increased dose of amiodarone, repeat catheter ablation, hybrid ablation, and pacemaker placement with AV node ablation.  Stress test 2015, low risk  CT ABD 2015 ABD athero   Prior CV studies:   The following studies were reviewed today:  Echocardiogram preserved ejection fraction, 10/2016 Results discussed with her, grossly normal with no estimation of right heart pressures   Past Medical  History:  Diagnosis Date  . A-fib ([place])   . Barretts esophagus   . Chest pain    a. 02/2014 Myoview: Ef 50%, no ischemia.  . Colon polyps   . COPD (chronic obstructive pulmonary disease) ([place])   . Depression with anxiety   . GERD (gastroesophageal reflux disease)   . Hx  of benign essential tremor   . Neuropathy   . Permanent atrial fibrillation [facility])    a. s/p ablation 01/2012 in [facility] by Dr. Boyd Kerbs;  b. On sotalol & Xarelto;  c. 02/2014 Echo: EF 50-55%, mild conc LVH, nl LA size/structure;  d. Recurrent afib 8/15 & 08/29/2014.  [name] Rectal fistula   . Rheumatoid arthritis ([place])    On methotrexate and orencia  . Urinary incontinence   . Vitamin D deficiency    Past Surgical History:  Procedure Laterality Date  . ABDOMINAL HYSTERECTOMY  1992  . APPENDECTOMY    . CARDIAC ELECTROPHYSIOLOGY STUDY AND ABLATION  2013  . CHOLECYSTECTOMY  1987  . COLONOSCOPY N/A 05/08/2015   Procedure: COLONOSCOPY;  Surgeon: Manya Silvas, MD;  Location: [facility] ENDOSCOPY;  Service: Endoscopy;  Laterality: N/A;  . COLONOSCOPY WITH PROPOFOL N/A 09/29/2019   Procedure: COLONOSCOPY WITH PROPOFOL;  Surgeon: Toledo, Benay Pike, MD;  Location: [facility] ENDOSCOPY;  Service: Gastroenterology;  Laterality: N/A;  . ESOPHAGOGASTRODUODENOSCOPY N/A 05/06/2015   Procedure: ESOPHAGOGASTRODUODENOSCOPY (EGD);  Surgeon: Lollie Sails, MD;  Location: [facility] ENDOSCOPY;  Service: Endoscopy;  Laterality: N/A;  . ESOPHAGOGASTRODUODENOSCOPY (EGD) WITH PROPOFOL N/A 09/29/2019   Procedure: ESOPHAGOGASTRODUODENOSCOPY (EGD) WITH PROPOFOL;  Surgeon: Toledo, Benay Pike, MD;  Location: [facility] ENDOSCOPY;  Service: Gastroenterology;  Laterality: N/A;  . LAPAROSCOPIC APPENDECTOMY N/A 02/19/2019   Procedure: APPENDECTOMY LAPAROSCOPIC converted to open appendectomy;  Surgeon: Herbert Pun, MD;  Location: [facility] ORS;  Service: General;  Laterality: N/A;  . OOPHORECTOMY    . oophrectomy Bilateral 1992     No outpatient medications have been marked as taking for the 02/23/20 encounter (Appointment) with Minna Merritts, MD.     Allergies:   Latex   Social History   Tobacco Use  . Smoking status: Former Smoker    Packs/day: 1.00    Years: 40.00    Pack years: 40.00    Quit date: 12/16/2011    Years since  quitting: 8.1  . Smokeless tobacco: Never Used  Substance Use Topics  . Alcohol use: Not Currently    Alcohol/week: 0.0 standard drinks    Comment: Occasionally has a drink  . Drug use: No     Current Facility-Administered Medications on File Prior to Visit  Medication Dose Route Frequency Provider Last Rate Last Admin  . 0.9 %  sodium chloride infusion   Intravenous PRN Fritzi Mandes, MD 30 mL/hr at 02/21/20 1709 50 mL at 02/21/20 1709  . acetaminophen (TYLENOL) tablet 650 mg  650 mg Oral Q6H PRN Dhungel, Nishant, MD   650 mg at 02/21/20 2047   Or  . acetaminophen (TYLENOL) suppository 650 mg  650 mg Rectal Q6H PRN Dhungel, Nishant, MD      . apixaban (ELIQUIS) tablet 5 mg  5 mg Oral BID Dhungel, Nishant, MD   5 mg at 02/22/20 1014  . bisacodyl (DULCOLAX) suppository 10 mg  10 mg Rectal Daily PRN [name], MD      . busPIRone (BUSPAR) tablet 10 mg  10 mg Oral TID Dhungel, Nishant, MD   10 mg at 02/22/20 1011  . ceFAZolin (ANCEF) IVPB 2g/100 mL  premix  2 g Intravenous Q8H Hall, Scott A, RPH 200 mL/hr at 02/22/20 0909 2 g at 02/22/20 0909  . diltiazem (CARDIZEM) tablet 30 mg  30 mg Oral Q6H Fritzi Mandes, MD   30 mg at 02/22/20 0524  . feeding supplement (ENSURE ENLIVE) (ENSURE ENLIVE) liquid 237 mL  237 mL Oral BID BM [name], MD   237 mL at 02/21/20 1000  . fluticasone furoate-vilanterol (BREO ELLIPTA) 100-25 MCG/INH 1 puff  1 puff Inhalation Daily Rito Ehrlich A, RPH   1 puff at 02/22/20 0901   And  . umeclidinium bromide (INCRUSE ELLIPTA) 62.5 MCG/INH 1 puff  1 puff Inhalation Daily Rito Ehrlich A, RPH   1 puff at 02/22/20 0902  . gabapentin (NEURONTIN) tablet 600 mg  600 mg Oral TID Dhungel, Nishant, MD   600 mg at 02/22/20 1015  . hyoscyamine (LEVSIN) tablet 0.125 mg  0.125 mg Oral PRN Dhungel, Nishant, MD      . insulin aspart (novoLOG) injection 0-5 Units  0-5 Units Subcutaneous QHS Fritzi Mandes, MD      . insulin aspart (novoLOG) injection 0-9 Units  0-9 Units  Subcutaneous TID WC Fritzi Mandes, MD   1 Units at 02/22/20 1249  . ipratropium-albuterol (DUONEB) 0.5-2.5 (3) MG/3ML nebulizer solution 3 mL  3 mL Nebulization Q6H PRN Fritzi Mandes, MD      . lipase/protease/amylase (CREON) capsule 36,000 Units  36,000 Units Oral TID AC Dhungel, Nishant, MD   36,000 Units at 02/22/20 1012  . methylPREDNISolone sodium succinate (SOLU-MEDROL) 125 mg/2 mL injection 60 mg  60 mg Intravenous Q12H Fritzi Mandes, MD   60 mg at 02/22/20 0524  . ondansetron (ZOFRAN) tablet 4 mg  4 mg Oral Q6H PRN Dhungel, Nishant, MD       Or  . ondansetron (ZOFRAN) injection 4 mg  4 mg Intravenous Q6H PRN Dhungel, Nishant, MD   4 mg at 02/16/20 1919  . pantoprazole (PROTONIX) EC tablet 40 mg  40 mg Oral Daily Dhungel, Nishant, MD   40 mg at 02/22/20 1015  . polyethylene glycol (MIRALAX / GLYCOLAX) packet 17 g  17 g Oral BID [name], MD   17 g at 02/22/20 1017  . propranolol ER (INDERAL LA) 24 hr capsule 120 mg  120 mg Oral Daily Dhungel, Nishant, MD   120 mg at 02/22/20 1011  . senna (SENOKOT) tablet 8.6 mg  1 tablet Oral QHS [name], MD   8.6 mg at 02/21/20 2048  . sertraline (ZOLOFT) tablet 50 mg  50 mg Oral Daily Dhungel, Nishant, MD   50 mg at 02/22/20 1014  . traMADol (ULTRAM) tablet 50 mg  50 mg Oral Q6H PRN [name], MD      . traZODone (DESYREL) tablet 50 mg  50 mg Oral QHS [name], MD   50 mg at 02/21/20 2048   Current Outpatient Medications on File Prior to Visit  Medication Sig Dispense Refill  . busPIRone (BUSPAR) 10 MG tablet Take 1 tablet (10 mg total) by mouth 3 (three) times daily. 270 tablet 0  . CREON 36000 units CPEP capsule Takes 2 capsules three times daily.    . cyclobenzaprine (FLEXERIL) 5 MG tablet Take 1 tablet (5 mg total) by mouth 3 (three) times daily as needed for muscle spasms. 30 tablet 1  . ELIQUIS 5 MG TABS tablet Take 1 tablet (5 mg total) by mouth 2 (two) times daily. 180 tablet 3  . esomeprazole (NEXIUM)  20 MG  capsule TAKE 1 CAPSULE BY MOUTH EVERY DAY 90 capsule 1  . Fluticasone-Umeclidin-Vilant (TRELEGY ELLIPTA) 100-62.5-25 MCG/INH AEPB Inhale 1 puff into the lungs daily. 60 each 0  . gabapentin (NEURONTIN) 600 MG tablet TAKE 1 TABLET BY MOUTH THREE TIMES A DAY 90 tablet 1  . HUMIRA PEN 40 MG/0.4ML PNKT Inject 40 mg as directed. Every 2 weeks    . hydrochlorothiazide (HYDRODIURIL) 25 MG tablet Take 1 tablet (25 mg total) by mouth daily. 30 tablet 11  . hydroxychloroquine (PLAQUENIL) 200 MG tablet Take 200 mg by mouth 2 (two) times daily.     . hydrOXYzine (ATARAX/VISTARIL) 50 MG tablet Take 0.5-1 tablets (25-50 mg total) by mouth daily as needed for anxiety. Patient has supplies (Patient taking differently: Take 50 mg by mouth at bedtime. Can take an extra 50 mg if needed) 90 tablet 0  . hyoscyamine (LEVSIN) 0.125 MG tablet Take 0.125 mg by mouth as needed.    . propranolol ER (INDERAL LA) 120 MG 24 hr capsule Take 1 capsule (120 mg total) by mouth daily. 90 capsule 3  . sertraline (ZOLOFT) 50 MG tablet TAKE 1 TABLET BY MOUTH EVERY DAY 90 tablet 0  . traZODone (DESYREL) 50 MG tablet Take 1 tablet (50 mg total) by mouth at bedtime. 90 tablet 0     Family Hx: The patient's family history includes Breast cancer in her cousin; Breast cancer (age of onset: 51) in her sister; Colon cancer in her father; Depression in her mother; Diabetes in her brother; Pancreatic cancer in her mother; Suicidality in her mother. There is no history of Neuropathy.  ROS:   Please see the history of present illness.    Review of Systems  Constitutional: Negative.   Respiratory: Negative.   Cardiovascular: Positive for leg swelling.  Gastrointestinal: Negative.   Musculoskeletal: Negative.   Neurological: Negative.   Psychiatric/Behavioral: Negative.   All other systems reviewed and are negative.    Labs/Other Tests and Data Reviewed:    Recent Labs: 03/02/2019: Magnesium 1.9 05/12/2019: TSH 3.25 02/16/2020: ALT 9  02/20/2020: BUN 20; Creatinine, Ser 0.77; Hemoglobin 9.5; Platelets 177; Potassium 3.6; Sodium 134 02/22/2020: B Natriuretic Peptide 519.0   Recent Lipid Panel Lab Results  Component Value Date/Time   CHOL 155 10/12/2018 10:40 AM   TRIG 3,560 (H) 02/19/2019 10:57 PM   HDL 38.90 (L) 10/12/2018 10:40 AM   CHOLHDL 4 10/12/2018 10:40 AM   LDLCALC 80 10/12/2018 10:40 AM    Wt Readings from Last 3 Encounters:  02/21/20 194 lb 0.1 oz (88 kg)  02/14/20 191 lb (86.6 kg)  01/26/20 195 lb 9.6 oz (88.7 kg)     Exam:    Vital Signs: Vital signs may also be detailed in the HPI There were no vitals taken for this visit.  Wt Readings from Last 3 Encounters:  02/21/20 194 lb 0.1 oz (88 kg)  02/14/20 191 lb (86.6 kg)  01/26/20 195 lb 9.6 oz (88.7 kg)   Temp Readings from Last 3 Encounters:  02/22/20 97.8 F (36.6 C) (Oral)  02/11/20 98 F (36.7 C) (Tympanic)  02/04/20 98 F (36.7 C) (Tympanic)   BP Readings from Last 3 Encounters:  02/22/20 (!) 140/96  02/11/20 101/80  02/04/20 112/76   Pulse Readings from Last 3 Encounters:  02/22/20 (!) 109  02/11/20 85  02/04/20 61    120/70, Pulse 90 resp 16  Well nourished, well developed female in no acute distress. Constitutional:  oriented to person, place,  and time. No distress.    ASSESSMENT & PLAN:    Permanent atrial fibrillation ([place]) - Plan: EKG 12-Lead Off all antiarrhythmics Discussed atrial fibrillation and risk of diastolic heart failure She will moderate her fluid intake, weight has been stable Asymptomatic  Chronic diastolic CHF Recommend she stay on Lasix 20 with potassium 10 for weight less than 210 pounds Lasix 20 twice daily with potassium twice daily for 210 or higher  Atherosclerosis of aorta (HCC) - Plan: EKG 12-Lead Cholesterol close to goal Recommended low carbohydrate diet Needs a walking program, weight loss  Other emphysema (HCC) Long smoking history, currently on inhalers Recommended weight  loss, walking program Followed by Dr. Alva Garnet,  Breathing better after diuresis, will try to keep weight less than 210 pounds as above  GI bleed due to NSAIDs Denies any GI bleed on eliquis Avoid NSAIDs Stable  Tremors of nervous system Also reports having neuropathy  Depression, recurrent (HCC) Stable, recommended regular walking program  Shortness of breath COPD, obesity, deconditioning, likely chronic diastolic CHF in the setting of atrial fibrillation Breathing somewhat better with diuresis will try to keep weight less than 210 pounds with Lasix as detailed above  COVID-19 Education: The signs and symptoms of COVID-19 were discussed with the patient and how to seek care for testing (follow up with PCP or arrange E-visit).  The importance of social distancing was discussed today.  Patient Risk:   After full review of this patients clinical status, I feel that they are at least moderate risk at this time.  Time:   Today, I have spent 25 minutes with the patient with telehealth technology discussing the cardiac and medical problems/diagnoses detailed above   10 min spent reviewing the chart prior to patient visit today   Medication Adjustments/Labs and Tests Ordered: Current medicines are reviewed at length with the patient today.  Concerns regarding medicines are outlined above.   Tests Ordered: No tests ordered   Medication Changes: No changes made   Disposition: Follow-up in 3 months   Signed, Ida Rogue, MD  02/22/2020 1:07 PM    [place] Office [address], [place], [place] [address]

## 2020-02-22 NOTE — Progress Notes (Signed)
Progress Note  Patient Name: Brandy Armstrong Date of Encounter: 02/22/2020  Primary Cardiologist: Ida Rogue, MD   Subjective   Patient reports mild shortness of breath as well as increasing cough over the last day.  She denies chest pain.  Inpatient Medications    Scheduled Meds: . apixaban  5 mg Oral BID  . busPIRone  10 mg Oral TID  . diltiazem  30 mg Oral Q6H  . feeding supplement (ENSURE ENLIVE)  237 mL Oral BID BM  . fluticasone furoate-vilanterol  1 puff Inhalation Daily   And  . umeclidinium bromide  1 puff Inhalation Daily  . gabapentin  600 mg Oral TID  . lipase/protease/amylase  36,000 Units Oral TID AC  . methylPREDNISolone (SOLU-MEDROL) injection  60 mg Intravenous Q12H  . pantoprazole  40 mg Oral Daily  . polyethylene glycol  17 g Oral BID  . propranolol ER  120 mg Oral Daily  . senna  1 tablet Oral QHS  . sertraline  50 mg Oral Daily  . traZODone  50 mg Oral QHS   Continuous Infusions: . sodium chloride 50 mL (02/21/20 1709)  .  ceFAZolin (ANCEF) IV 2 g (02/22/20 0219)   PRN Meds: sodium chloride, acetaminophen **OR** acetaminophen, bisacodyl, hyoscyamine, ipratropium-albuterol, ondansetron **OR** ondansetron (ZOFRAN) IV, traMADol   Vital Signs    Vitals:   02/21/20 1521 02/21/20 1843 02/21/20 2300 02/22/20 0511  BP: (!) 168/116 (!) 139/98 (!) 135/99 (!) 145/106  Pulse: (!) 115 99 (!) 105 94  Resp: 18 20 19 20   Temp: 98.6 F (37 C) 98.1 F (36.7 C) (!) 97.5 F (36.4 C) 97.8 F (36.6 C)  TempSrc: Oral Oral Oral Oral  SpO2: 97% 93%  95%  Weight:      Height:        Intake/Output Summary (Last 24 hours) at 02/22/2020 0811 Last data filed at 02/22/2020 0500 Gross per 24 hour  Intake 3 ml  Output 850 ml  Net -847 ml   Last 3 Weights 02/21/2020 02/16/2020 02/14/2020  Weight (lbs) 194 lb 0.1 oz 194 lb 0.1 oz 191 lb  Weight (kg) 88 kg 88 kg 86.637 kg      Telemetry   None  ECG    No new tracing.  Physical Exam   GEN: No acute  distress.   Neck: No JVD Cardiac: Irregularly irregular without murmurs. Respiratory: Fair air movement with faint left basilar crackles and decreased breath sounds at the right base. GI: Soft, nontender, non-distended  MS: No edema; No deformity. Neuro:  Nonfocal  Psych: Normal affect   Labs    High Sensitivity Troponin:  No results for input(s): TROPONINIHS in the last 720 hours.    Chemistry Recent Labs  Lab 02/16/20 1052 02/16/20 1052 02/17/20 0507 02/18/20 0250 02/20/20 0416  NA 130*   < > 135 136 134*  K 4.0   < > 3.5 3.7 3.6  CL 98   < > 106 104 99  CO2 20*   < > 22 24 26   GLUCOSE 114*   < > 90 83 100*  BUN 30*   < > 20 18 20   CREATININE 1.13*   < > 0.79 0.80 0.77  CALCIUM 9.1   < > 7.7* 8.5* 8.7*  PROT 7.3  --   --   --   --   ALBUMIN 3.3*  --   --   --   --   AST 21  --   --   --   --  ALT 9  --   --   --   --   ALKPHOS 82  --   --   --   --   BILITOT 1.3*  --   --   --   --   GFRNONAA 49*   < > >60 >60 >60  GFRAA 57*   < > >60 >60 >60  ANIONGAP 12   < > 7 8 9    < > = values in this interval not displayed.     Hematology Recent Labs  Lab 02/17/20 0507 02/18/20 0250 02/20/20 0416  WBC 6.7 6.0 7.0  RBC 3.99 4.04 3.89  HGB 9.7* 9.8* 9.5*  HCT 32.3* 32.4* 31.4*  MCV 81.0 80.2 80.7  MCH 24.3* 24.3* 24.4*  MCHC 30.0 30.2 30.3  RDW 18.1* 18.2* 18.4*  PLT 120* 141* 177    BNPNo results for input(s): BNP, PROBNP in the last 168 hours.   DDimer No results for input(s): DDIMER in the last 168 hours.   Radiology    No results found.  Cardiac Studies   TTE (02/17/2020): 1. Left ventricular ejection fraction, by estimation, is 60 to 65%. The  left ventricle has normal function. The left ventricle has no regional  wall motion abnormalities. Left ventricular diastolic parameters are  indeterminate.  2. Right ventricular systolic function is normal. The right ventricular  size is normal.  3. No valve vegetation noted   Patient Profile     74  y.o. female with history of permanent atrial fibrillation, aortic atherosclerosis, chronic diastolic heart failure, COPD, HTN, RA, anemia, pancreatic insufficiency with chronic diarrhea, and anxiety, whom we are seeing for possible TEE in the setting of bacteremia.  Assessment & Plan    MSSA bacteremia: I agree with plans to proceed with TEE to exclude endocarditis.  While wheezing has improved since yesterday, I worry that overall respiratory status has actually declined.  I recommend deferring TEE for at least one more day in order to facilitate optimization of respiratory status before proceeding with elective TEE.  Please make Ms. Chirino NPO after midnight so that TEE can be reconsidered tomorrow.  Acute respiratory failure with hypoxia and chronic HFpEF: Likely multifactorial with concern for obstructive lung disease exacerbation yesterday that prompted initiation of IV steroids.  No significant wheezing appreciated on exam today but breath sounds are diminished and there are faint crackles at the left base.  Consider repeating CXR and continuing antimicrobial therapy per IM and ID.  Ms. Esplin does not appear significantly volume overloaded, but weights and I/O's are not being tracked.  It may be worthwhile to check a BNP.  If BNP elevated or CXR suggests developing pulmonary edema, diuresis would be indicated.  Permanent atrial fibrillation: Patient is not on telemetry with recorded HR's of 94-115 bpm documented.  Continue current doses of apixaban and diltiazem.    For questions or updates, please contact [place] Please consult [email or web address] for contact info under [facility].  Signed, Nelva Bush, MD  02/22/2020, 8:11 AM

## 2020-02-22 NOTE — Progress Notes (Signed)
Occupational Therapy Treatment Patient Details Name: Brandy Armstrong MRN: [id number] DOB: 1946-04-09 Today's Date: 02/22/2020    History of present illness 74 y.o. female, with history of A. fib on anticoagulation, chronic pancreatic insufficiency, chronic back pain COPD, rheumatoid arthritis here with AMS/confusion.   OT comments  Brandy Armstrong was seen for OT treatment on this date. Upon arrival to room pt was alert, seated upright in bed. Pt was A&O x 4 reporting no pain. Pt agreeable to tx but deferred OOB activity 2/2 anticipatory pain c movement and anxiety re: an upcoming procedure. Pt instructed in importance of OOB activity and repositioning in bed for pain management and improved functional strength/endurance. Requiring MIN assist for seated grooming ADL tasks. Pt verbalized understanding of instruction provided. Pt progressing toward goals. Pt continues to benefit from skilled OT services to maximize return to PLOF and minimize risk of future falls, injury, caregiver burden, and readmission. Will continue to follow POC. Discharge recommendation remains appropriate.     Follow Up Recommendations  SNF    Equipment Recommendations  Other (comment)(defer to next level of care)    Recommendations for Other Services      Precautions / Restrictions Precautions Precautions: Fall Restrictions Weight Bearing Restrictions: No       Mobility Bed Mobility Overal bed mobility: Needs Assistance Bed Mobility: Supine to Sit;Sit to Supine;Rolling Rolling: Min assist   Supine to sit: Min assist Sit to supine: Supervision   General bed mobility comments: MIN A sup>sit c HOB ~80*. Pt required encouragement to sit EOB and MIN A to bring L hip to EOB  Transfers Overall transfer level: Needs assistance               General transfer comment: Pt deferred OOB activity this session    Balance Overall balance assessment: Needs assistance Sitting-balance support: Single extremity  supported;Feet supported Sitting balance-Leahy Scale: Fair Sitting balance - Comments: Pt sat EOB for ~8 mins - maintained balance t/o unilateral grooming activity.                                    ADL either performed or assessed with clinical judgement   ADL Overall ADL's : Needs assistance/impaired                                       General ADL Comments: Set up for self drinking seated in bed. Setup + MIN VCs hairbrushing seated EOB - cueing for positioning/core engagement. MIN A adjust purewick at bed level.      Vision       Perception     Praxis      Cognition Arousal/Alertness: Awake/alert Behavior During Therapy: WFL for tasks assessed/performed;Anxious Overall Cognitive Status: Within Functional Limits for tasks assessed                                 General Comments: Pt A&O x4; Pt endorses anxiety re: procedure tmrw AM and fear of pain if she stands up. Pt benefitted from encouragement/affirmation on her progress t/o session        Exercises Other Exercises Other Exercises: Pt educated re: pain management, PLB, importance of OOB activity for improved endurance and pain management, falls prevention, OT role, post-acute rehab process, iportance of  repositioning self in bed for improved strength Other Exercises: Therapuetic use of self, sup<>sit, rolling in bed for repositioning, purewick management, hair brushing seated EOB, sitting balance/tolerance, self-drinking    Shoulder Instructions       General Comments SpO2 91% at start of session. Pt complained of poor O2 at start of session - OT noted pt was resting on O2 tubing and upon repositioning pt stated "I feel it now." Pt's SpO2 93% t/o rest of session.     Pertinent Vitals/ Pain       Pain Assessment: No/denies pain(Pt denies pain but affirms fear of pain if she gets up)  Home Living                                          Prior  Functioning/Environment              Frequency  Min 2X/week        Progress Toward Goals  OT Goals(current goals can now be found in the care plan section)  Progress towards OT goals: Progressing toward goals  Acute Rehab OT Goals Patient Stated Goal: "I just want to go home". OT Goal Formulation: With patient Time For Goal Achievement: 03/02/20 Potential to Achieve Goals: Fair ADL Goals Pt Will Perform Lower Body Dressing: with mod assist;sitting/lateral leans Pt Will Transfer to Toilet: with mod assist Pt Will Perform Toileting - Clothing Manipulation and hygiene: with mod assist;sitting/lateral leans Pt/caregiver will Perform Home Exercise Program: Both right and left upper extremity;Increased strength;With theraband;With written HEP provided  Plan Discharge plan remains appropriate;Frequency remains appropriate    Co-evaluation                 AM-PAC OT "6 Clicks" Daily Activity     Outcome Measure   Help from another person eating meals?: None Help from another person taking care of personal grooming?: None Help from another person toileting, which includes using toliet, bedpan, or urinal?: A Lot Help from another person bathing (including washing, rinsing, drying)?: A Lot Help from another person to put on and taking off regular upper body clothing?: A Little Help from another person to put on and taking off regular lower body clothing?: A Lot 6 Click Score: 17    End of Session Equipment Utilized During Treatment: Oxygen(2L [place])  OT Visit Diagnosis: Muscle weakness (generalized) (M62.81)   Activity Tolerance Patient tolerated treatment well;Other (comment)(Pt limited by anxiety)   Patient Left in bed;with call bell/phone within reach;with bed alarm set   Nurse Communication          Time: 1100-1133 OT Time Calculation (min): 33 min  Charges: OT General Charges $OT Visit: 1 Visit OT Treatments $Self Care/Home Management : 23-37 mins  Brandy Armstrong, M.S. OTR/L  02/22/20, 12:29 PM

## 2020-02-22 NOTE — Progress Notes (Signed)
ID Pt with MSSa bacteremia 2/17 BC positive 2/20 BC Ng Awaiting TEE If tee cannot be done due to resp status she will need 6 weeks of Iv cefazolin 2 grams every 8 hours PICC line can be palced as repeat culture negative OPAT orders will be placed tomorrow

## 2020-02-22 NOTE — Consult Note (Signed)
Pulmonary Medicine          Date: 02/22/2020,   MRN# [id number] Brandy Armstrong Jan 22, 1946     AdmissionWeight: 25 kg                 CurrentWeight: 36 kg(per chart)  Referring physician: Dr. Posey Pronto    CHIEF COMPLAINT:   Acute COPD exacerbation with sepsis bacteremia requiring transesophageal echo   HISTORY OF PRESENT ILLNESS   This is a pleasant 74 year old female with a history of atrial fibrillation, Barrett's esophagus, recurrent chest pain, COPD, depression, GERD, neuropathy, chronic pancreatitis, rheumatoid arthritis, who came in due to altered mental status with confusion.  In the ER she was found to be febrile with a temperature of 101 Fahrenheit, she was unable to give detailed history due to altered mentation but did deny diarrhea nausea vomiting blurred vision headache chest pain cough denies exposure to COVID-19 known ill, denies any sick exposure.  She was tachypneic in the mid 20s, she did have a UA with pattern consistent with possible UTI 1 in ER.  She also had mild thrombocytopenia at 138 and an AKI which looked prerenal.  She was tested for COVID-19 on arrival to the ED which was found negative.  She also had an x-ray which was minimally remarkable with atelectasis at the bases.  She was admitted to hospitalist service and placed on empiric broad-spectrum antibiotics.  She had blood cultures which grew out sensitive staph aureus.  Patient was subsequently evaluated by infectious disease as well as cardiology, and recommendation is to proceed with transesophageal echo however unable to perform due to worsening respiratory status per cardiology.  Pulmonary consultation was placed for further evaluation of COPD exacerbation and chronic lung disease to expedite medical optimization as well as obtain TEE.   PAST MEDICAL HISTORY   Past Medical History:  Diagnosis Date  . A-fib ([place])   . Barretts esophagus   . Chest pain    a. 02/2014 Myoview: Ef 50%, no ischemia.  .  Colon polyps   . COPD (chronic obstructive pulmonary disease) ([place])   . Depression with anxiety   . GERD (gastroesophageal reflux disease)   . Hx of benign essential tremor   . Neuropathy   . Permanent atrial fibrillation [facility])    a. s/p ablation 01/2012 in [facility] by Dr. Boyd Kerbs;  b. On sotalol & Xarelto;  c. 02/2014 Echo: EF 50-55%, mild conc LVH, nl LA size/structure;  d. Recurrent afib 8/15 & 08/29/2014.  [name] Rectal fistula   . Rheumatoid arthritis ([place])    On methotrexate and orencia  . Urinary incontinence   . Vitamin D deficiency      SURGICAL HISTORY   Past Surgical History:  Procedure Laterality Date  . ABDOMINAL HYSTERECTOMY  1992  . APPENDECTOMY    . CARDIAC ELECTROPHYSIOLOGY STUDY AND ABLATION  2013  . CHOLECYSTECTOMY  1987  . COLONOSCOPY N/A 05/08/2015   Procedure: COLONOSCOPY;  Surgeon: Manya Silvas, MD;  Location: [facility] ENDOSCOPY;  Service: Endoscopy;  Laterality: N/A;  . COLONOSCOPY WITH PROPOFOL N/A 09/29/2019   Procedure: COLONOSCOPY WITH PROPOFOL;  Surgeon: Toledo, Benay Pike, MD;  Location: [facility] ENDOSCOPY;  Service: Gastroenterology;  Laterality: N/A;  . ESOPHAGOGASTRODUODENOSCOPY N/A 05/06/2015   Procedure: ESOPHAGOGASTRODUODENOSCOPY (EGD);  Surgeon: Lollie Sails, MD;  Location: [facility] ENDOSCOPY;  Service: Endoscopy;  Laterality: N/A;  . ESOPHAGOGASTRODUODENOSCOPY (EGD) WITH PROPOFOL N/A 09/29/2019   Procedure: ESOPHAGOGASTRODUODENOSCOPY (EGD) WITH PROPOFOL;  Surgeon: Alice Reichert, Benay Pike, MD;  Location: [facility] ENDOSCOPY;  Service: Gastroenterology;  Laterality: N/A;  . LAPAROSCOPIC APPENDECTOMY N/A 02/19/2019   Procedure: APPENDECTOMY LAPAROSCOPIC converted to open appendectomy;  Surgeon: Herbert Pun, MD;  Location: [facility] ORS;  Service: General;  Laterality: N/A;  . OOPHORECTOMY    . oophrectomy Bilateral 1992     FAMILY HISTORY   Family History  Problem Relation Age of Onset  . Depression Mother   . Pancreatic cancer Mother   . Suicidality Mother    . Colon cancer Father   . Breast cancer Sister 1  . Diabetes Brother   . Breast cancer Cousin   . Neuropathy Neg Hx      SOCIAL HISTORY   Social History   Tobacco Use  . Smoking status: Former Smoker    Packs/day: 1.00    Years: 40.00    Pack years: 40.00    Quit date: 12/16/2011    Years since quitting: 8.1  . Smokeless tobacco: Never Used  Substance Use Topics  . Alcohol use: Not Currently    Alcohol/week: 0.0 standard drinks    Comment: Occasionally has a drink  . Drug use: No     MEDICATIONS    Home Medication:    Current Medication:  Current Facility-Administered Medications:  .  0.9 %  sodium chloride infusion, , Intravenous, PRN, Fritzi Mandes, MD, Last Rate: 30 mL/hr at 02/21/20 1709, 50 mL at 02/21/20 1709 .  acetaminophen (TYLENOL) tablet 650 mg, 650 mg, Oral, Q6H PRN, 650 mg at 02/21/20 2047 **OR** acetaminophen (TYLENOL) suppository 650 mg, 650 mg, Rectal, Q6H PRN, Dhungel, Nishant, MD .  apixaban (ELIQUIS) tablet 5 mg, 5 mg, Oral, BID, Dhungel, Nishant, MD, 5 mg at 02/22/20 1014 .  bisacodyl (DULCOLAX) suppository 10 mg, 10 mg, Rectal, Daily PRN, [name], MD .  busPIRone (BUSPAR) tablet 10 mg, 10 mg, Oral, TID, Dhungel, Nishant, MD, 10 mg at 02/22/20 1011 .  ceFAZolin (ANCEF) IVPB 2g/100 mL premix, 2 g, Intravenous, Q8H, Hall, Scott A, RPH, Last Rate: 200 mL/hr at 02/22/20 0909, 2 g at 02/22/20 0909 .  diltiazem (CARDIZEM) tablet 30 mg, 30 mg, Oral, Q6H, Fritzi Mandes, MD, 30 mg at 02/22/20 0524 .  feeding supplement (ENSURE ENLIVE) (ENSURE ENLIVE) liquid 237 mL, 237 mL, Oral, BID BM, [name], MD, 237 mL at 02/21/20 1000 .  fluticasone furoate-vilanterol (BREO ELLIPTA) 100-25 MCG/INH 1 puff, 1 puff, Inhalation, Daily, 1 puff at 02/22/20 0901 **AND** umeclidinium bromide (INCRUSE ELLIPTA) 62.5 MCG/INH 1 puff, 1 puff, Inhalation, Daily, Rito Ehrlich A, RPH, 1 puff at 02/22/20 0902 .  gabapentin (NEURONTIN) tablet 600 mg, 600 mg, Oral, TID,  Dhungel, Nishant, MD, 600 mg at 02/22/20 1015 .  hyoscyamine (LEVSIN) tablet 0.125 mg, 0.125 mg, Oral, PRN, Dhungel, Nishant, MD .  insulin aspart (novoLOG) injection 0-5 Units, 0-5 Units, Subcutaneous, QHS, Patel, Sona, MD .  insulin aspart (novoLOG) injection 0-9 Units, 0-9 Units, Subcutaneous, TID WC, Fritzi Mandes, MD, 1 Units at 02/22/20 1249 .  ipratropium-albuterol (DUONEB) 0.5-2.5 (3) MG/3ML nebulizer solution 3 mL, 3 mL, Nebulization, Q6H PRN, Fritzi Mandes, MD .  lipase/protease/amylase (CREON) capsule 36,000 Units, 36,000 Units, Oral, TID AC, Dhungel, Nishant, MD, 36,000 Units at 02/22/20 1012 .  methylPREDNISolone sodium succinate (SOLU-MEDROL) 125 mg/2 mL injection 60 mg, 60 mg, Intravenous, Q12H, Fritzi Mandes, MD, 60 mg at 02/22/20 0524 .  ondansetron (ZOFRAN) tablet 4 mg, 4 mg, Oral, Q6H PRN **OR** ondansetron (ZOFRAN) injection 4 mg, 4 mg, Intravenous, Q6H PRN, Dhungel, Nishant, MD, 4  mg at 02/16/20 1919 .  pantoprazole (PROTONIX) EC tablet 40 mg, 40 mg, Oral, Daily, Dhungel, Nishant, MD, 40 mg at 02/22/20 1015 .  polyethylene glycol (MIRALAX / GLYCOLAX) packet 17 g, 17 g, Oral, BID, [name], MD, 17 g at 02/22/20 1017 .  propranolol ER (INDERAL LA) 24 hr capsule 120 mg, 120 mg, Oral, Daily, Dhungel, Nishant, MD, 120 mg at 02/22/20 1011 .  senna (SENOKOT) tablet 8.6 mg, 1 tablet, Oral, QHS, [name], MD, 8.6 mg at 02/21/20 2048 .  sertraline (ZOLOFT) tablet 50 mg, 50 mg, Oral, Daily, Dhungel, Nishant, MD, 50 mg at 02/22/20 1014 .  traMADol (ULTRAM) tablet 50 mg, 50 mg, Oral, Q6H PRN, [name], MD .  traZODone (DESYREL) tablet 50 mg, 50 mg, Oral, QHS, [name], MD, 50 mg at 02/21/20 2048    ALLERGIES   Latex     REVIEW OF SYSTEMS    Review of Systems:  Gen:  Denies  fever, sweats, chills weigh loss  HEENT: Denies blurred vision, double vision, ear pain, eye pain, hearing loss, nose bleeds, sore throat Cardiac:  No dizziness, chest pain  or heaviness, chest tightness,edema Resp:   Denies cough or sputum porduction, shortness of breath,wheezing, hemoptysis,  Gi: Denies swallowing difficulty, stomach pain, nausea or vomiting, diarrhea, constipation, bowel incontinence Gu:  Denies bladder incontinence, burning urine Ext:   Denies Joint pain, stiffness or swelling Skin: Denies  skin rash, easy bruising or bleeding or hives Endoc:  Denies polyuria, polydipsia , polyphagia or weight change Psych:   Denies depression, insomnia or hallucinations   Other:  All other systems negative   VS: BP (!) 140/96 (BP Location: Left Arm)   Pulse (!) 109   Temp 97.8 F (36.6 C) (Oral)   Resp 20   Ht 5\' 7"  (1.702 m) Comment: per chart  Wt 88 kg Comment: per chart  SpO2 96%   BMI 30.39 kg/m      PHYSICAL EXAM    GENERAL:NAD, no fevers, chills, no weakness no fatigue HEAD: Normocephalic, atraumatic.  EYES: Pupils equal, round, reactive to light. Extraocular muscles intact. No scleral icterus.  MOUTH: Moist mucosal membrane. Dentition intact. No abscess noted.  EAR, NOSE, THROAT: Clear without exudates. No external lesions.  NECK: Supple. No thyromegaly. No nodules. No JVD.  PULMONARY: Crackles at the bases bilaterally with decreased breath sounds bilaterally without wheezing or rhonchorous breath sounds CARDIOVASCULAR: S1 and S2. Regular rate and rhythm. No murmurs, rubs, or gallops. No edema. Pedal pulses 2+ bilaterally.  GASTROINTESTINAL: Soft, nontender, nondistended. No masses. Positive bowel sounds. No hepatosplenomegaly.  MUSCULOSKELETAL: No swelling, clubbing, or edema. Range of motion full in all extremities.  NEUROLOGIC: Cranial nerves II through XII are intact. No gross focal neurological deficits. Sensation intact. Reflexes intact.  SKIN: No ulceration, lesions, rashes, or cyanosis. Skin warm and dry. Turgor intact.  PSYCHIATRIC: Mood, affect within normal limits. The patient is awake, alert and oriented x 3. Insight,  judgment intact.       IMAGING    MR THORACIC SPINE W WO CONTRAST  Result Date: 02/17/2020 CLINICAL DATA:  Back pain, MSSA bacteremia, assess for vertebral osteomyelitis, discitis; sepsis. EXAM: MRI THORACIC WITHOUT AND WITH CONTRAST TECHNIQUE: Multiplanar and multiecho pulse sequences of the thoracic spine were obtained without and with intravenous contrast. CONTRAST:  7.28mL GADAVIST GADOBUTROL 1 MMOL/ML IV SOLN COMPARISON:  CT angiogram chest 05/11/2014 FINDINGS: MRI THORACIC SPINE FINDINGS Multiple sequences are significantly motion degraded, limiting evaluation. Most notably,  there is moderate to moderately severe motion degradation of the sagittal and axial T1 weighted postcontrast imaging and moderate motion degradation of the axial T2 weighted imaging. Alignment:  Normal Vertebrae: Vertebral body height is maintained. Within described limitations, no marrow edema or abnormal osseous enhancement is demonstrated. Cord: Within described limitations, no spinal cord signal abnormality is identified. Paraspinal and other soft tissues: T2 hyperintense signal abnormality within the dependent aspect of the mid to lower lungs is nonspecific. No paraspinal soft tissue abnormality is identified. Disc levels: Mild disc degeneration throughout the thoracic spine. At T1-T2, small central disc protrusion without contact upon the spinal cord or significant canal stenosis. At T5-T6, there is a small right center disc protrusion which contacts the right ventral spinal cord. No significant central canal stenosis at this level. At T7-T8, tiny right center disc protrusion without canal stenosis. No significant focal disc herniation or spinal canal stenosis at the remaining levels. No significant foraminal stenosis within the thoracic spine. Multilevel ventrolateral osteophytes within the lower thoracic spine. IMPRESSION: 1. Examination limited by significant motion degradation. 2. No appreciable findings of  discitis/osteomyelitis within the thoracic spine. No paraspinal soft tissue abnormality is identified. 3. Mild thoracic spondylosis. Most notably, a small T5-T6 right center disc protrusion contacts the ventral spinal cord. No significant central canal stenosis or neural foraminal narrowing at any level. 4. T2 hyperintense signal abnormality within the dependent aspect of the mid to lower lungs is nonspecific, but may reflect atelectasis. Consider a repeat chest radiograph if the patient has respiratory symptoms. Electronically Signed   By: Kellie Simmering DO   On: 02/17/2020 14:42   MR Lumbar Spine W Wo Contrast  Result Date: 02/17/2020 CLINICAL DATA:  Back pain, MSSA bacteremia, assess for vertebral osteomyelitis, discitis; sepsis. EXAM: MRI LUMBAR SPINE WITHOUT AND WITH CONTRAST TECHNIQUE: Multiplanar and multiecho pulse sequences of the lumbar spine were obtained without and with intravenous contrast. CONTRAST:  7.6mL GADAVIST GADOBUTROL 1 MMOL/ML IV SOLN COMPARISON:  Lumbar spine MRI 06/10/2019 FINDINGS: Multiple sequences are significantly motion degraded, limiting evaluation. Most notably there is moderate to moderately severe motion degradation of the sagittal and axial postcontrast T1 weighted imaging. Segmentation:  5 lumbar vertebrae. Alignment: Lumbar dextrocurvature. 2 mm L1-L2 grade 1 retrolisthesis. Trace L3-L4 retrolisthesis. Trace L4-L5 anterolisthesis. Vertebrae: Vertebral body height is maintained. There is mild endplate edema at [id number]. There is also patchy edema and enhancement within the posterior elements. Most notably, there is edema and somewhat prominent enhancement within the articular pillars along the L2-L3 and L3-L4 right facet joints (for instance as seen on series 21, image 6). Conus medullaris and cauda equina: Conus extends to the L1-L2 level. No signal abnormality within the visualized distal spinal cord. Within described limitations, no definite abnormal enhancement identified  along the visualized distal spinal cord or cauda equina nerve roots. Paraspinal and other soft tissues: No abnormality identified within included portions of the abdomen/retroperitoneum. Atrophy of the lumbar paraspinal musculature. No other definite paraspinal soft tissue abnormality identified. Disc levels: Unless otherwise stated, the level by level findings below have not significantly changed since prior MRI 06/10/2019. Moderate L1-L2 disc degeneration. Mild disc degeneration at the remaining levels. T12-L1: No disc herniation. No significant canal or foraminal stenosis. L1-L2: Mild grade 1 retrolisthesis. Small disc bulge. No significant spinal canal or neural foraminal narrowing. L2-L3: Mild facet arthrosis. No disc herniation. No significant canal or foraminal stenosis. L3-L4: Trace retrolisthesis. Disc bulge. The disc bulge is slightly more prominent within the right subarticular zone. Mild  facet arthrosis/ligamentum flavum hypertrophy. New from prior exam, there is mild right subarticular narrowing with slight crowding of the descending right L4 nerve root. Central canal patent. Mild left neural foraminal narrowing, progressed. L4-L5: Trace anterolisthesis. Disc uncovering with superimposed small central disc protrusion. Moderate facet arthrosis with mild ligamentum flavum hypertrophy. Trace fluid within the bilateral facet joints. Mild bilateral subarticular and central canal narrowing without frank nerve root impingement. No significant foraminal stenosis. L5-S1: Small central disc protrusion. Moderate facet arthrosis. No significant spinal canal stenosis or neural foraminal narrowing. IMPRESSION: 1. Examination limited by significant motion degradation. 2. Mild endplate edema and enhancement at L3-L4 may be degenerative. 3. Patchy edema and enhancement within the lumbar posterior elements. Most notably there is edema and somewhat prominent enhancement within the articular pillars along the right L2-L3  and L3-L4 facet joints. There is significant facet arthrosis at these levels and findings may be degenerative. Infection cannot be definitively excluded, although there are no other findings at this level to suggest this on the current exam (i.e. sizable facet joint effusion). 4. L3-L4 spondylosis has slightly progressed from 06/10/2019. There is now mild right subarticular stenosis at this level with slight crowding of the descending right L4 nerve root. Mild left neural foraminal narrowing at this level has also progressed. 5. Lumbar spondylosis is otherwise unchanged. Electronically Signed   By: Kellie Simmering DO   On: 02/17/2020 15:16   CT ABDOMEN PELVIS W CONTRAST  Result Date: 02/16/2020 CLINICAL DATA:  Nausea and vomiting EXAM: CT ABDOMEN AND PELVIS WITH CONTRAST TECHNIQUE: Multidetector CT imaging of the abdomen and pelvis was performed using the standard protocol following bolus administration of intravenous contrast. CONTRAST:  116mL OMNIPAQUE IOHEXOL 300 MG/ML  SOLN COMPARISON:  02/19/2019 FINDINGS: Lower chest: Mild right basilar atelectasis is noted. No sizable effusion is noted. Hepatobiliary: No focal liver abnormality is seen. Status post cholecystectomy. No biliary dilatation. Pancreas: Unremarkable. No pancreatic ductal dilatation or surrounding inflammatory changes. Spleen: Normal in size without focal abnormality. Adrenals/Urinary Tract: Adrenal glands are within normal limits. Normal enhancement of the kidneys is seen bilaterally. Normal excretion is noted bilaterally. No obstructive changes are seen. The bladder is well distended. Stomach/Bowel: Diverticular change of the colon is noted without evidence of diverticulitis. The appendix is not well visualized consistent with the known surgical history. No small bowel obstructive changes are seen. The stomach is within normal limits. Vascular/Lymphatic: Mild ectasia of the infrarenal aorta to 2.9 cm is noted. Tortuosity is seen as well as  diffuse atherosclerotic calcifications. No lymphadenopathy is seen. Reproductive: Status post hysterectomy. No adnexal masses. Other: No abdominal wall hernia or abnormality. No abdominopelvic ascites. Musculoskeletal: No acute or significant osseous findings. IMPRESSION: Mild right basilar atelectasis without sizable effusion. Diverticulosis without diverticulitis. Mild ectasia of the infrarenal aorta. Electronically Signed   By: Inez Catalina M.D.   On: 02/16/2020 12:28   DG Chest Port 1 View  Result Date: 02/22/2020 CLINICAL DATA:  Shortness of breath, cough EXAM: PORTABLE CHEST 1 VIEW COMPARISON:  02/16/2020 FINDINGS: Trace right pleural effusion. Bilateral mild interstitial thickening, right greater than left. No pneumothorax. Stable cardiomediastinal silhouette. No aggressive osseous lesion. IMPRESSION: Small right pleural effusion. Mild right basilar atelectasis. Bilateral interstitial thickening right greater than left which may reflect mild interstitial edema. Electronically Signed   By: Kathreen Devoid   On: 02/22/2020 10:40   DG Chest Portable 1 View  Result Date: 02/16/2020 CLINICAL DATA:  Fever and altered mental status. EXAM: PORTABLE CHEST 1 VIEW COMPARISON:  07/09/2019 FINDINGS: Mild streaky atelectasis about the right minor fissure. There is no edema, consolidation, or pneumothorax. Blunting of the lateral right costophrenic sulcus. Cardiomegaly and aortic tortuosity. No edema, effusion, or pneumothorax. IMPRESSION: Minimal atelectasis and pleural fluid on the right. Electronically Signed   By: Monte Fantasia M.D.   On: 02/16/2020 11:12   ECHOCARDIOGRAM COMPLETE  Result Date: 02/17/2020    ECHOCARDIOGRAM REPORT   Patient Name:   AASHIKA LITTLEJOHN Date of Exam: 02/17/2020 Medical Rec #:  [id number]     Height:       67.0 in Accession #:    [id number]    Weight:       194.0 lb Date of Birth:  08-02-1946      BSA:          2.00 m Patient Age:    22 years      BP:           127/84 mmHg Patient Gender:  F             HR:           107 bpm. Exam Location:  [facility] Procedure: 2D Echo, Cardiac Doppler and Color Doppler Indications:     bactermia 790.7  History:         Patient has prior history of Echocardiogram examinations, most                  recent 02/20/2019. COPD; Arrythmias:Atrial Fibrillation. Chest                  pain.  Sonographer:     Sherrie Sport RDCS (AE) Referring Phys:  [place] Diagnosing Phys: Ida Rogue MD  Sonographer Comments: Technically difficult study due to poor echo windows, no apical window and no subcostal window. IMPRESSIONS  1. Left ventricular ejection fraction, by estimation, is 60 to 65%. The left ventricle has normal function. The left ventricle has no regional wall motion abnormalities. Left ventricular diastolic parameters are indeterminate.  2. Right ventricular systolic function is normal. The right ventricular size is normal.  3. No valve vegetation noted FINDINGS  Left Ventricle: Left ventricular ejection fraction, by estimation, is 60 to 65%. The left ventricle has normal function. The left ventricle has no regional wall motion abnormalities. The left ventricular internal cavity size was normal in size. There is  no left ventricular hypertrophy. Left ventricular diastolic parameters are indeterminate. Right Ventricle: The right ventricular size is normal. No increase in right ventricular wall thickness. Right ventricular systolic function is normal. Left Atrium: Left atrial size was normal in size. Right Atrium: Right atrial size was normal in size. Pericardium: There is no evidence of pericardial effusion. Mitral Valve: The mitral valve is normal in structure and function. Normal mobility of the mitral valve leaflets. No evidence of mitral valve regurgitation. No evidence of mitral valve stenosis. Tricuspid Valve: The tricuspid valve is normal in structure. Tricuspid valve regurgitation is not demonstrated. No evidence of tricuspid stenosis. Aortic Valve: The  aortic valve is normal in structure and function. Aortic valve regurgitation is not visualized. Mild to moderate aortic valve sclerosis/calcification is present, without any evidence of aortic stenosis. Pulmonic Valve: The pulmonic valve was normal in structure. Pulmonic valve regurgitation is not visualized. No evidence of pulmonic stenosis. Aorta: The aortic root is normal in size and structure. Venous: The inferior vena cava is normal in size with greater than 50% respiratory variability, suggesting right atrial pressure of 3 mmHg. IAS/Shunts:  No atrial level shunt detected by color flow Doppler.  LEFT VENTRICLE PLAX 2D LVIDd:         4.31 cm LVIDs:         3.06 cm LV PW:         1.19 cm LV IVS:        1.49 cm LVOT diam:     2.00 cm LV SV Index:   22.65 LVOT Area:     3.14 cm  LEFT ATRIUM         Index LA diam:    3.80 cm 1.90 cm/m                        PULMONIC VALVE AORTA                 RVOT Peak grad: 2 mmHg Ao Root diam: 3.00 cm   SHUNTS Systemic Diam: 2.00 cm Ida Rogue MD Electronically signed by Ida Rogue MD Signature Date/Time: 02/17/2020/3:57:33 PM    Final       ASSESSMENT/PLAN   Acute on chronic hypoxemic respiratory failure -Unclear etiology possible COPD with concomitant acute on chronic diastolic CHF -Based on chest x-ray above interstitial infiltrates appear to be asymmetric with worse on the right as well as pleural effusion on right, this can often be seen in cardiac related interstitial edema.  Trial of Lasix has been administered today.  Recommend strict I's and O's and monitoring of urine output.  Additional diuresis would be beneficial however patient is developing acute renal insufficiency.  Currently patient is net over 1 L positive.  Will administer once daily albumin starting today to improve diuretic efficacy.  Consider CT chest for better visualization of lung parenchyma as right hilar structures appear to be widened in mediastinum than expected and above chest  x-ray. -Patient did require oxygen on arrival and is having cough intermittently-possibly due to acute COPD exacerbation secondary to sepsis with staph bacteremia -We will work on recruitment maneuvers to combat atelectatic segments at the bases, starting with MetaNeb with ipratropium every 4 hours -Encourage use of incentive spirometer few times each hour while hospitalized  Moderate acute COPD exacerbation Agree with Solu-Medrol-currently at 60 twice daily plan for weaning -DuoNeb every 4 hours with MetaNeb -Chest physiotherapy and incentive spirometer   -Patient is currently on cefazolin -I will stop nonspecific beta-blocker propranolol and change to metoprolol due to bronchospasm.    Acute kidney injury-KDIGO stage 2  will d/c non-essential nephrotoxic medications - stopping multivitamins, protonix, NSAIDs -will administer albumin 1amp daily - monitor renal function and urine output - plan for ongoing diuresis   Sepsis with MSSA bacteremia -Patient is being worked up with infectious disease, plan is for IV cefazolin for 4 to 6 weeks. -TEE for native valve vegetation eval   AF and HFpEF   - cardiology on case - appreciate collaboration    - currently on CCB and BB(switched from prop to metop tart today)   - eliquis for [facility]    - TEE for veg     Thank you for allowing me to participate in the care of this patient.   Patient/Family are satisfied with care plan and all questions have been answered.   This document was prepared using Dragon voice recognition software and may include unintentional dictation errors.     Ottie Glazier, M.D.  Division of [place]

## 2020-02-22 NOTE — Progress Notes (Signed)
Physical Therapy Treatment Patient Details Name: Brandy Armstrong MRN: [id number] DOB: Feb 25, 1946 Today's Date: 02/22/2020    History of Present Illness 74 y.o. female, with history of A. fib on anticoagulation, chronic pancreatic insufficiency, chronic back pain COPD, rheumatoid arthritis here with AMS/confusion.    PT Comments    Pt initially hesitant to try a lot with PT (or at first even to participate) but she agrees that she has been relatively inactive and does need to try to do something.  She had need for multiple rest breaks (HR somewhat labile 80-110s) t/o the session that did not seem directly related to activity level, rest breaks and close monitoring of vitals.  Pt on 2.5 L O2, sats staying in the mid/low 90s t/0 session.   She was able to tolerate mobility in bed (rolling, multiple supine/sit transitions) and then the standing and EOB side stepping with slow but safe transitions and effort.  Pt still far from her baseline and endorses that idea that she needs to go to rehab, but ultimately she did more than she expected and was pleased to have done so.   Follow Up Recommendations  SNF     Equipment Recommendations       Recommendations for Other Services       Precautions / Restrictions Precautions Precautions: Fall Restrictions Weight Bearing Restrictions: No    Mobility  Bed Mobility Overal bed mobility: Needs Assistance Bed Mobility: Supine to Sit Rolling: Min assist   Supine to sit: Min assist     General bed mobility comments: Pt did well with getting to EOB, needing only light assist.  Did have some mild dizziness, but BP was 120s/60s and symptoms very short in duration  Transfers Overall transfer level: Needs assistance Equipment used: Rolling walker (2 wheeled) Transfers: Sit to/from Stand Sit to Stand: Min assist;Mod assist         General transfer comment: Pt was able to rise to standing from elevated bed height (1-2") and assist to keep hips  moving forward and up  Ambulation/Gait             General Gait Details: Pt was able/willing to do some limited side stepping EOB, but did not feel confident in getting away from the bed.  She was reliant on the walker but had no overt balance issues, excessive fatigue or c/o pain   Stairs             Wheelchair Mobility    Modified Rankin (Stroke Patients Only)       Balance Overall balance assessment: Needs assistance Sitting-balance support: Single extremity supported;Feet supported Sitting balance-Leahy Scale: Fair     Standing balance support: Bilateral upper extremity supported Standing balance-Leahy Scale: Fair Standing balance comment: reliant on walker, but no LOBs or overt unsteadiness                            Cognition Arousal/Alertness: Awake/alert Behavior During Therapy: WFL for tasks assessed/performed;Anxious Overall Cognitive Status: Within Functional Limits for tasks assessed                                        Exercises General Exercises - Lower Extremity Ankle Circles/Pumps: AROM;10 reps Heel Slides: Strengthening;10 reps(with resisted leg extensions) Hip ABduction/ADduction: Strengthening;10 reps Straight Leg Raises: 10 reps;AROM;AAROM    General Comments General comments (skin integrity,  edema, etc.): Pt on 2.5L t/o the effort, sats staying in the low 90s      Pertinent Vitals/Pain Pain Assessment: No/denies pain(pt reports that back/flank pain has resolved, surprisingly )    Home Living                      Prior Function            PT Goals (current goals can now be found in the care plan section) Progress towards PT goals: Progressing toward goals    Frequency    Min 2X/week      PT Plan Current plan remains appropriate    Co-evaluation              AM-PAC PT "6 Clicks" Mobility   Outcome Measure  Help needed turning from your back to your side while in a flat bed  without using bedrails?: A Little Help needed moving from lying on your back to sitting on the side of a flat bed without using bedrails?: A Little Help needed moving to and from a bed to a chair (including a wheelchair)?: A Lot Help needed standing up from a chair using your arms (e.g., wheelchair or bedside chair)?: A Little Help needed to walk in hospital room?: A Lot Help needed climbing 3-5 steps with a railing? : Total 6 Click Score: 14    End of Session Equipment Utilized During Treatment: Gait belt Activity Tolerance: Patient tolerated treatment well Patient left: with bed alarm set;with call bell/phone within reach;in bed;with nursing/sitter in room Nurse Communication: Mobility status PT Visit Diagnosis: Muscle weakness (generalized) (M62.81);Difficulty in walking, not elsewhere classified (R26.2)     Time: [id number] PT Time Calculation (min) (ACUTE ONLY): 56 min  Charges:  $Therapeutic Exercise: 23-37 mins $Therapeutic Activity: 23-37 mins                     Kreg Shropshire, DPT 02/22/2020, 3:56 PM

## 2020-02-23 ENCOUNTER — Encounter
Admission: EM | Disposition: A | Discharge status: Skilled Nursing Facility | Payer: Self-pay | Source: Home / Self Care | Attending: Family Medicine

## 2020-02-23 ENCOUNTER — Ambulatory Visit: Payer: Medicare Other | Admitting: Cardiovascular Disease

## 2020-02-23 ENCOUNTER — Inpatient Hospital Stay: Payer: Medicare Other

## 2020-02-23 ENCOUNTER — Inpatient Hospital Stay
Admit: 2020-02-23 | Discharge: 2020-02-23 | Disposition: A | Discharge status: Home / Self Care | Payer: Medicare Other | Attending: Cardiovascular Disease | Admitting: Cardiovascular Disease

## 2020-02-23 ENCOUNTER — Inpatient Hospital Stay: Payer: Self-pay

## 2020-02-23 DIAGNOSIS — I5032 Chronic diastolic (congestive) heart failure: Secondary | ICD-10-CM | POA: Diagnosis not present

## 2020-02-23 DIAGNOSIS — E871 Hypo-osmolality and hyponatremia: Secondary | ICD-10-CM | POA: Diagnosis not present

## 2020-02-23 DIAGNOSIS — A419 Sepsis, unspecified organism: Secondary | ICD-10-CM | POA: Diagnosis not present

## 2020-02-23 DIAGNOSIS — I4821 Permanent atrial fibrillation: Secondary | ICD-10-CM | POA: Diagnosis not present

## 2020-02-23 DIAGNOSIS — R279 Unspecified lack of coordination: Secondary | ICD-10-CM | POA: Diagnosis not present

## 2020-02-23 DIAGNOSIS — R319 Hematuria, unspecified: Secondary | ICD-10-CM | POA: Diagnosis not present

## 2020-02-23 DIAGNOSIS — I358 Other nonrheumatic aortic valve disorders: Secondary | ICD-10-CM | POA: Diagnosis not present

## 2020-02-23 DIAGNOSIS — D509 Iron deficiency anemia, unspecified: Secondary | ICD-10-CM | POA: Diagnosis not present

## 2020-02-23 DIAGNOSIS — M62838 Other muscle spasm: Secondary | ICD-10-CM | POA: Diagnosis not present

## 2020-02-23 DIAGNOSIS — I517 Cardiomegaly: Secondary | ICD-10-CM | POA: Diagnosis not present

## 2020-02-23 DIAGNOSIS — J918 Pleural effusion in other conditions classified elsewhere: Secondary | ICD-10-CM | POA: Diagnosis not present

## 2020-02-23 DIAGNOSIS — J439 Emphysema, unspecified: Secondary | ICD-10-CM | POA: Diagnosis not present

## 2020-02-23 DIAGNOSIS — J449 Chronic obstructive pulmonary disease, unspecified: Secondary | ICD-10-CM | POA: Diagnosis not present

## 2020-02-23 DIAGNOSIS — N390 Urinary tract infection, site not specified: Secondary | ICD-10-CM | POA: Diagnosis not present

## 2020-02-23 DIAGNOSIS — R069 Unspecified abnormalities of breathing: Secondary | ICD-10-CM | POA: Diagnosis not present

## 2020-02-23 DIAGNOSIS — Z66 Do not resuscitate: Secondary | ICD-10-CM | POA: Diagnosis not present

## 2020-02-23 DIAGNOSIS — M6281 Muscle weakness (generalized): Secondary | ICD-10-CM | POA: Diagnosis not present

## 2020-02-23 DIAGNOSIS — Z20822 Contact with and (suspected) exposure to covid-19: Secondary | ICD-10-CM | POA: Diagnosis not present

## 2020-02-23 DIAGNOSIS — Z515 Encounter for palliative care: Secondary | ICD-10-CM | POA: Diagnosis not present

## 2020-02-23 DIAGNOSIS — Z452 Encounter for adjustment and management of vascular access device: Secondary | ICD-10-CM | POA: Diagnosis not present

## 2020-02-23 DIAGNOSIS — A4101 Sepsis due to Methicillin susceptible Staphylococcus aureus: Secondary | ICD-10-CM | POA: Diagnosis not present

## 2020-02-23 DIAGNOSIS — D649 Anemia, unspecified: Secondary | ICD-10-CM | POA: Diagnosis not present

## 2020-02-23 DIAGNOSIS — R410 Disorientation, unspecified: Secondary | ICD-10-CM | POA: Diagnosis not present

## 2020-02-23 DIAGNOSIS — Z800 Family history of malignant neoplasm of digestive organs: Secondary | ICD-10-CM | POA: Diagnosis not present

## 2020-02-23 DIAGNOSIS — Z803 Family history of malignant neoplasm of breast: Secondary | ICD-10-CM | POA: Diagnosis not present

## 2020-02-23 DIAGNOSIS — I110 Hypertensive heart disease with heart failure: Secondary | ICD-10-CM | POA: Diagnosis not present

## 2020-02-23 DIAGNOSIS — R0602 Shortness of breath: Secondary | ICD-10-CM | POA: Diagnosis not present

## 2020-02-23 DIAGNOSIS — I4891 Unspecified atrial fibrillation: Secondary | ICD-10-CM | POA: Diagnosis not present

## 2020-02-23 DIAGNOSIS — G629 Polyneuropathy, unspecified: Secondary | ICD-10-CM | POA: Diagnosis not present

## 2020-02-23 DIAGNOSIS — R488 Other symbolic dysfunctions: Secondary | ICD-10-CM | POA: Diagnosis not present

## 2020-02-23 DIAGNOSIS — R0902 Hypoxemia: Secondary | ICD-10-CM | POA: Diagnosis not present

## 2020-02-23 DIAGNOSIS — R0689 Other abnormalities of breathing: Secondary | ICD-10-CM | POA: Diagnosis not present

## 2020-02-23 DIAGNOSIS — Z743 Need for continuous supervision: Secondary | ICD-10-CM | POA: Diagnosis not present

## 2020-02-23 DIAGNOSIS — I10 Essential (primary) hypertension: Secondary | ICD-10-CM | POA: Diagnosis not present

## 2020-02-23 DIAGNOSIS — J9600 Acute respiratory failure, unspecified whether with hypoxia or hypercapnia: Secondary | ICD-10-CM | POA: Diagnosis not present

## 2020-02-23 DIAGNOSIS — R0603 Acute respiratory distress: Secondary | ICD-10-CM | POA: Diagnosis not present

## 2020-02-23 DIAGNOSIS — K8689 Other specified diseases of pancreas: Secondary | ICD-10-CM | POA: Diagnosis not present

## 2020-02-23 DIAGNOSIS — F419 Anxiety disorder, unspecified: Secondary | ICD-10-CM | POA: Diagnosis present

## 2020-02-23 DIAGNOSIS — J90 Pleural effusion, not elsewhere classified: Secondary | ICD-10-CM | POA: Diagnosis not present

## 2020-02-23 DIAGNOSIS — J441 Chronic obstructive pulmonary disease with (acute) exacerbation: Secondary | ICD-10-CM | POA: Diagnosis not present

## 2020-02-23 DIAGNOSIS — A4189 Other specified sepsis: Secondary | ICD-10-CM | POA: Diagnosis not present

## 2020-02-23 DIAGNOSIS — Z7189 Other specified counseling: Secondary | ICD-10-CM | POA: Diagnosis not present

## 2020-02-23 DIAGNOSIS — Z7901 Long term (current) use of anticoagulants: Secondary | ICD-10-CM

## 2020-02-23 DIAGNOSIS — M0559 Rheumatoid polyneuropathy with rheumatoid arthritis of multiple sites: Secondary | ICD-10-CM | POA: Diagnosis not present

## 2020-02-23 DIAGNOSIS — R7881 Bacteremia: Secondary | ICD-10-CM | POA: Diagnosis not present

## 2020-02-23 DIAGNOSIS — J440 Chronic obstructive pulmonary disease with acute lower respiratory infection: Secondary | ICD-10-CM | POA: Diagnosis not present

## 2020-02-23 DIAGNOSIS — J9621 Acute and chronic respiratory failure with hypoxia: Secondary | ICD-10-CM | POA: Diagnosis not present

## 2020-02-23 DIAGNOSIS — J189 Pneumonia, unspecified organism: Secondary | ICD-10-CM | POA: Diagnosis present

## 2020-02-23 DIAGNOSIS — E876 Hypokalemia: Secondary | ICD-10-CM | POA: Diagnosis not present

## 2020-02-23 DIAGNOSIS — E7800 Pure hypercholesterolemia, unspecified: Secondary | ICD-10-CM | POA: Diagnosis not present

## 2020-02-23 DIAGNOSIS — G9341 Metabolic encephalopathy: Secondary | ICD-10-CM | POA: Diagnosis not present

## 2020-02-23 DIAGNOSIS — J9601 Acute respiratory failure with hypoxia: Secondary | ICD-10-CM | POA: Diagnosis not present

## 2020-02-23 DIAGNOSIS — R498 Other voice and resonance disorders: Secondary | ICD-10-CM | POA: Diagnosis not present

## 2020-02-23 DIAGNOSIS — M069 Rheumatoid arthritis, unspecified: Secondary | ICD-10-CM | POA: Diagnosis not present

## 2020-02-23 DIAGNOSIS — Z79899 Other long term (current) drug therapy: Secondary | ICD-10-CM | POA: Diagnosis not present

## 2020-02-23 DIAGNOSIS — R52 Pain, unspecified: Secondary | ICD-10-CM | POA: Diagnosis not present

## 2020-02-23 DIAGNOSIS — Z87891 Personal history of nicotine dependence: Secondary | ICD-10-CM | POA: Diagnosis not present

## 2020-02-23 DIAGNOSIS — B9561 Methicillin susceptible Staphylococcus aureus infection as the cause of diseases classified elsewhere: Secondary | ICD-10-CM | POA: Diagnosis not present

## 2020-02-23 DIAGNOSIS — K2100 Gastro-esophageal reflux disease with esophagitis, without bleeding: Secondary | ICD-10-CM | POA: Diagnosis not present

## 2020-02-23 DIAGNOSIS — R627 Adult failure to thrive: Secondary | ICD-10-CM | POA: Diagnosis not present

## 2020-02-23 HISTORY — PX: TEE WITHOUT CARDIOVERSION: SHX5443

## 2020-02-23 LAB — GLUCOSE, CAPILLARY
Glucose-Capillary: 134 mg/dL — ABNORMAL HIGH (ref 70–99)
Glucose-Capillary: 127 mg/dL — ABNORMAL HIGH (ref 70–99)
Glucose-Capillary: 110 mg/dL — ABNORMAL HIGH (ref 70–99)

## 2020-02-23 LAB — RESPIRATORY PANEL BY RT PCR (FLU A&B, COVID)
Influenza A by PCR: NEGATIVE
Influenza B by PCR: NEGATIVE
SARS Coronavirus 2 by RT PCR: NEGATIVE

## 2020-02-23 SURGERY — ECHOCARDIOGRAM, TRANSESOPHAGEAL
Anesthesia: Choice

## 2020-02-23 MED ORDER — SODIUM CHLORIDE 0.9 % IV SOLN: INTRAVENOUS | Status: DC

## 2020-02-23 MED ORDER — FENTANYL CITRATE (PF) 100 MCG/2ML IJ SOLN
INTRAMUSCULAR | Status: AC
  Filled 2020-02-23: qty 2

## 2020-02-23 MED ORDER — LIDOCAINE VISCOUS HCL 2 % MT SOLN
OROMUCOSAL | Status: AC
  Filled 2020-02-23: qty 15

## 2020-02-23 MED ORDER — CHLORHEXIDINE GLUCONATE CLOTH 2 % EX PADS: 6.0000 | MEDICATED_PAD | Freq: Every day | CUTANEOUS | Status: DC

## 2020-02-23 MED ORDER — DILTIAZEM HCL ER COATED BEADS 120 MG PO CP24
120.0000 mg | ORAL_CAPSULE | Freq: Every day | ORAL | Status: DC
  Administered 2020-02-23: 13:00:00 120 mg via ORAL
  Filled 2020-02-23: qty 1

## 2020-02-23 MED ORDER — MIDAZOLAM HCL 5 MG/5ML IJ SOLN
INTRAMUSCULAR | Status: AC
  Filled 2020-02-23: qty 5

## 2020-02-23 MED ORDER — MIDAZOLAM HCL 2 MG/2ML IJ SOLN
INTRAMUSCULAR | Status: AC | PRN
  Administered 2020-02-23 (×2): 1 mg via INTRAVENOUS

## 2020-02-23 MED ORDER — SODIUM CHLORIDE FLUSH 0.9 % IV SOLN
INTRAVENOUS | Status: AC
  Filled 2020-02-23: qty 10

## 2020-02-23 MED ORDER — BUTAMBEN-TETRACAINE-BENZOCAINE 2-2-14 % EX AERO
INHALATION_SPRAY | CUTANEOUS | Status: AC
  Filled 2020-02-23: qty 5

## 2020-02-23 MED ORDER — SODIUM CHLORIDE 0.9% FLUSH: 10.0000 mL | INTRAVENOUS | Status: DC | PRN

## 2020-02-23 MED ORDER — FENTANYL CITRATE (PF) 100 MCG/2ML IJ SOLN
INTRAMUSCULAR | Status: AC | PRN
  Administered 2020-02-23 (×2): 25 ug via INTRAVENOUS

## 2020-02-23 MED ORDER — CEFAZOLIN IV (FOR PTA / DISCHARGE USE ONLY): 2.0000 g | Freq: Three times a day (TID) | INTRAVENOUS | 0 refills | Status: DC

## 2020-02-23 MED ORDER — DILTIAZEM HCL ER COATED BEADS 120 MG PO CP24: 120.0000 mg | ORAL_CAPSULE | Freq: Every day | ORAL | 0 refills | Status: DC

## 2020-02-23 NOTE — Progress Notes (Signed)
[place] [facility] [place] NAME: Brandy Armstrong    MR#:  [id number]  DATE OF BIRTH:  Jul 05, 1946  SUBJECTIVE:  Feels well sats 96% on RA breathing comfortably.  Not wheezing.  REVIEW OF SYSTEMS:   Review of Systems  Constitutional: Negative for chills, fever and weight loss.  HENT: Negative for ear discharge, ear pain and nosebleeds.   Eyes: Negative for blurred vision, pain and discharge.  Respiratory: Positive for shortness of breath. Negative for sputum production, wheezing and stridor.   Cardiovascular: Negative for chest pain, palpitations, orthopnea and PND.  Gastrointestinal: Negative for abdominal pain, diarrhea, nausea and vomiting.  Genitourinary: Negative for frequency and urgency.  Musculoskeletal: Negative for back pain and joint pain.  Neurological: Negative for sensory change, speech change, focal weakness and weakness.  Psychiatric/Behavioral: Negative for depression and hallucinations. The patient is not nervous/anxious.    Tolerating Diet:yes Tolerating PT: SNF  DRUG ALLERGIES:   Allergies  Allergen Reactions  . Latex     Itching Itching    VITALS:  Blood pressure 129/90, pulse 78, temperature 98.5 F (36.9 C), temperature source Oral, resp. rate 20, height 5\' 7"  (1.702 m), weight 88 kg, SpO2 96 %.  PHYSICAL EXAMINATION:   Physical Exam  GENERAL:  74 y.o.-year-old patient lying in the bed with no acute distress.  EYES: Pupils equal, round, reactive to light and accommodation. No scleral icterus.   HEENT: Head atraumatic, normocephalic. Oropharynx and nasopharynx clear.  NECK:  Supple, no jugular venous distention. No thyroid enlargement, no tenderness.  LUNGS: Normal breath sounds bilaterally, no wheezing, rales, rhonchi. No use of accessory muscles of respiration. No resp distress CARDIOVASCULAR: S1, S2 normal. No murmurs, rubs, or gallops. Tachycardia, mild ABDOMEN: Soft, nontender, nondistended. Bowel sounds  present. No organomegaly or mass.  EXTREMITIES: No cyanosis, clubbing or edema b/l.   Spider veins+ NEUROLOGIC: Cranial nerves II through XII are intact. No focal Motor or sensory deficits b/l.   PSYCHIATRIC:  patient is alert and oriented x 3. Mild anxious SKIN: No obvious rash, lesion, or ulcer.   LABORATORY PANEL:  CBC Recent Labs  Lab 02/20/20 0416  WBC 7.0  HGB 9.5*  HCT 31.4*  PLT 177    Chemistries  Recent Labs  Lab 02/16/20 1052 02/17/20 0507 02/20/20 0416  NA 130*   < > 134*  K 4.0   < > 3.6  CL 98   < > 99  CO2 20*   < > 26  GLUCOSE 114*   < > 100*  BUN 30*   < > 20  CREATININE 1.13*   < > 0.77  CALCIUM 9.1   < > 8.7*  AST 21  --   --   ALT 9  --   --   ALKPHOS 82  --   --   BILITOT 1.3*  --   --    < > = values in this interval not displayed.   Cardiac Enzymes No results for input(s): TROPONINI in the last 168 hours. RADIOLOGY:  DG Chest Port 1 View  Result Date: 02/22/2020 CLINICAL DATA:  Shortness of breath, cough EXAM: PORTABLE CHEST 1 VIEW COMPARISON:  02/16/2020 FINDINGS: Trace right pleural effusion. Bilateral mild interstitial thickening, right greater than left. No pneumothorax. Stable cardiomediastinal silhouette. No aggressive osseous lesion. IMPRESSION: Small right pleural effusion. Mild right basilar atelectasis. Bilateral interstitial thickening right greater than left which may reflect mild interstitial edema. Electronically Signed   By: Elbert Ewings  Evian Salguero   On: 02/22/2020 10:40   ASSESSMENT AND PLAN:  74 year old woman multiple medical problems outlined below, presented with confusion for approximately 48 hours, subjective chills, right flank pain, fever, dysuria.  Admitted for sepsis secondary to UTI, acute pyelonephritis  Sepsis secondary to MSSA bacteremia, possible UTI, possible acute pyelonephritis. - 2D echocardiogram reassuring. No valvular vegetation - MRI thoracic and lumbar spine did not show any clear evidence of  infection/discitis --Remains afebrile.  Mentation appears to be at baseline--occ confusion --Continue cefazolin, follow-up blood cultures are negative in 3 days, PICC line today after TEE - Appreciate infectious disease recommendations. --Plan for TEE 2/24 since breathing is stable.  Thoracic back pain.  Secondary to degenerative disease as seen on MRI of lumbar and thoracic spine. --Continue Tylenol and tramadol.   --Could challenge with a lower dose of pain is uncontrolled with tramadol. --Continue PT  Acute hypoxic respiratory failure due to COPD Flare with acute on chronic HFpEF - Given her history of COPD, acute illness has unmasked occult hypoxia.  - Continue home inhaler --recieved Solu-Medrol 60 mg IV BID and duo nebs round-the-clock. - BNP 516--give Lasix 40 mg x1--uop 1300 cc -sats 96% on RA -CXR 2/23 showed some congestion -Pulmonary Dr Lanney Gins 's input appreciated--gave IV albumin to improve diuretic effect. Will d/c it now  Chronic normocytic anemia, per oncology note iron deficiency, -due to chronic blood loss complicated by chronic anticoagulation.  -Appears to be close to baseline.   -Followed by hematology as an outpatient. No history of bleeding. - Hemoglobin stable.  No further inpatient evaluation suggested.  Atrial fibrillation on chronic anticoagulation --Intermittent mild tachycardia.  Continue propranolol (on for tremors apparently).  Asymptomatic. --Continue apixaban -- patient remains tachycardic will add Cardizem 30 mg Q6 hourly--change to Cardizem CD 120 mg. HR 70-90  Rheumatoid arthritis on methotrexate and Arentz. --Hold medications this admission.  Depression --Continue BuSpar, Zoloft & trazodone  Pancreatic insufficiency, chronic --Continue Creon          Obesity unspecified --Continue Ensure per dietitian  Disposition Plan: From home.   She is on IV antibiotics for bacteremia and awaiting  PICC line, TEE today  -Await TOC to figure  out which SNF will accept her with IV abxs--there are 3 places reviewing it DVT prophylaxis: apixaban Code Status: Full Family Communication:  [name] by telephone  TOTAL TIME TAKING CARE OF THIS PATIENT: **25* minutes.  >50% time spent on counselling and coordination of care  Note: This dictation was prepared with Dragon dictation along with smaller phrase technology. Any transcriptional errors that result from this process are unintentional.  Fritzi Mandes M.D    [facility]   CC: Primary care physician; [name], FNPPatient ID: Brandy Armstrong, female   DOB: [birth year], 74 y.o.   MRN: [id number]

## 2020-02-23 NOTE — Procedures (Signed)
Transesophageal Echocardiogram :  Indication: bacteremia  Procedure: 4cc of viscous lidocaine were given orally to provide local anesthesia to the oropharynx. The patient was positioned supine on the left side, bite block provided. The patient was moderately sedated with the doses of versed and fentanyl as detailed below.  Using digital technique an omniplane probe was advanced into the esophagus without incident.   Moderate sedation: 1. Sedation used:  Versed: 2mg , Fentanyl: 72mcg 2. Time administered:   9:50  Time when patient started recovery:10:10 3. I was face to face during this time, total of 2mins  See report in EPIC  for complete details: In brief, imaging revealed no evidence for endocarditis. There was normal LV function with no RWMAs and no mural apical thrombus. Estimated ejection fraction was 60%.  Right sided cardiac chambers were normal with no evidence of pulmonary hypertension.  Imaging of the septum showed no ASD or VSD 2D and color flow confirmed no PFO  The LA was well visualized in orthogonal views.  There was no thrombus in the LA and LA appendage   The descending thoracic aorta had no  mural aortic debris with no evidence of aneurysmal dilation or disection   Aaron Edelman Agbor-Etang 02/23/2020 10:10 AM

## 2020-02-23 NOTE — Consult Note (Signed)
Pulmonary Medicine          Date: 02/23/2020,   MRN# [id number] [name] 1946-10-19     AdmissionWeight: 61 kg                 CurrentWeight: 82 kg(per chart)  Referring physician: Dr. Posey Pronto    CHIEF COMPLAINT:   Acute COPD exacerbation with sepsis bacteremia requiring transesophageal echo   SUBJECTIVE   Patient feeling better.  She diuresed well overnight 1300cc urine.   Patient had TEE done today.   Patient had Right arm PICC placed today.    PAST MEDICAL HISTORY   Past Medical History:  Diagnosis Date  . A-fib ([place])   . Barretts esophagus   . Chest pain    a. 02/2014 Myoview: Ef 50%, no ischemia.  . Colon polyps   . COPD (chronic obstructive pulmonary disease) ([place])   . Depression with anxiety   . GERD (gastroesophageal reflux disease)   . Hx of benign essential tremor   . Neuropathy   . Permanent atrial fibrillation [facility])    a. s/p ablation 01/2012 in [facility] by Dr. Boyd Kerbs;  b. On sotalol & Xarelto;  c. 02/2014 Echo: EF 50-55%, mild conc LVH, nl LA size/structure;  d. Recurrent afib 8/15 & 08/29/2014.  [name] Rectal fistula   . Rheumatoid arthritis ([place])    On methotrexate and orencia  . Urinary incontinence   . Vitamin D deficiency      SURGICAL HISTORY   Past Surgical History:  Procedure Laterality Date  . ABDOMINAL HYSTERECTOMY  1992  . APPENDECTOMY    . CARDIAC ELECTROPHYSIOLOGY STUDY AND ABLATION  2013  . CHOLECYSTECTOMY  1987  . COLONOSCOPY N/A 05/08/2015   Procedure: COLONOSCOPY;  Surgeon: Manya Silvas, MD;  Location: [facility] ENDOSCOPY;  Service: Endoscopy;  Laterality: N/A;  . COLONOSCOPY WITH PROPOFOL N/A 09/29/2019   Procedure: COLONOSCOPY WITH PROPOFOL;  Surgeon: Toledo, Benay Pike, MD;  Location: [facility] ENDOSCOPY;  Service: Gastroenterology;  Laterality: N/A;  . ESOPHAGOGASTRODUODENOSCOPY N/A 05/06/2015   Procedure: ESOPHAGOGASTRODUODENOSCOPY (EGD);  Surgeon: Lollie Sails, MD;  Location: [facility] ENDOSCOPY;  Service: Endoscopy;   Laterality: N/A;  . ESOPHAGOGASTRODUODENOSCOPY (EGD) WITH PROPOFOL N/A 09/29/2019   Procedure: ESOPHAGOGASTRODUODENOSCOPY (EGD) WITH PROPOFOL;  Surgeon: Toledo, Benay Pike, MD;  Location: [facility] ENDOSCOPY;  Service: Gastroenterology;  Laterality: N/A;  . LAPAROSCOPIC APPENDECTOMY N/A 02/19/2019   Procedure: APPENDECTOMY LAPAROSCOPIC converted to open appendectomy;  Surgeon: Herbert Pun, MD;  Location: [facility] ORS;  Service: General;  Laterality: N/A;  . OOPHORECTOMY    . oophrectomy Bilateral 1992  . TEE WITHOUT CARDIOVERSION N/A 02/21/2020   Procedure: TRANSESOPHAGEAL ECHOCARDIOGRAM (TEE);  Surgeon: Wellington Hampshire, MD;  Location: [facility] ORS;  Service: Cardiovascular;  Laterality: N/A;     FAMILY HISTORY   Family History  Problem Relation Age of Onset  . Depression Mother   . Pancreatic cancer Mother   . Suicidality Mother   . Colon cancer Father   . Breast cancer Sister 34  . Diabetes Brother   . Breast cancer Cousin   . Neuropathy Neg Hx      SOCIAL HISTORY   Social History   Tobacco Use  . Smoking status: Former Smoker    Packs/day: 1.00    Years: 40.00    Pack years: 40.00    Quit date: 12/16/2011    Years since quitting: 8.1  . Smokeless tobacco: Never Used  Substance Use Topics  . Alcohol use: Not Currently  Alcohol/week: 0.0 standard drinks    Comment: Occasionally has a drink  . Drug use: No     MEDICATIONS    Home Medication:    Current Medication:  Current Facility-Administered Medications:  .  0.9 %  sodium chloride infusion, , Intravenous, PRN, Fritzi Mandes, MD, Stopped at 02/21/20 1934 .  acetaminophen (TYLENOL) tablet 650 mg, 650 mg, Oral, Q6H PRN, 650 mg at 02/21/20 2047 **OR** acetaminophen (TYLENOL) suppository 650 mg, 650 mg, Rectal, Q6H PRN, Dhungel, Nishant, MD .  apixaban (ELIQUIS) tablet 5 mg, 5 mg, Oral, BID, Dhungel, Nishant, MD, 5 mg at 02/23/20 1243 .  bisacodyl (DULCOLAX) suppository 10 mg, 10 mg, Rectal, Daily PRN, [name], MD .  busPIRone (BUSPAR) tablet 10 mg, 10 mg, Oral, TID, Dhungel, Nishant, MD, 10 mg at 02/22/20 2340 .  ceFAZolin (ANCEF) IVPB 2g/100 mL premix, 2 g, Intravenous, Q8H, Hall, Scott A, RPH, Stopped at 02/23/20 1303 .  Chlorhexidine Gluconate Cloth 2 % PADS 6 each, 6 each, Topical, Daily, Fritzi Mandes, MD .  diltiazem (CARDIZEM CD) 24 hr capsule 120 mg, 120 mg, Oral, Daily, Fritzi Mandes, MD, 120 mg at 02/23/20 1244 .  feeding supplement (ENSURE ENLIVE) (ENSURE ENLIVE) liquid 237 mL, 237 mL, Oral, BID BM, [name], MD, 237 mL at 02/21/20 1000 .  fentaNYL (SUBLIMAZE) 100 MCG/2ML injection, , , ,  .  fluticasone furoate-vilanterol (BREO ELLIPTA) 100-25 MCG/INH 1 puff, 1 puff, Inhalation, Daily, 1 puff at 02/23/20 1239 **AND** umeclidinium bromide (INCRUSE ELLIPTA) 62.5 MCG/INH 1 puff, 1 puff, Inhalation, Daily, Nazari, Walid A, RPH, 1 puff at 02/23/20 1240 .  gabapentin (NEURONTIN) tablet 600 mg, 600 mg, Oral, TID, Dhungel, Nishant, MD, 600 mg at 02/22/20 2341 .  insulin aspart (novoLOG) injection 0-5 Units, 0-5 Units, Subcutaneous, QHS, Patel, Sona, MD .  insulin aspart (novoLOG) injection 0-9 Units, 0-9 Units, Subcutaneous, TID WC, Fritzi Mandes, MD, 2 Units at 02/22/20 1800 .  ipratropium-albuterol (DUONEB) 0.5-2.5 (3) MG/3ML nebulizer solution 3 mL, 3 mL, Nebulization, Q6H PRN, Fritzi Mandes, MD, 3 mL at 02/22/20 1308 .  ipratropium-albuterol (DUONEB) 0.5-2.5 (3) MG/3ML nebulizer solution 3 mL, 3 mL, Nebulization, Q4H, Lanney Gins, Arvis Miguez, MD, 3 mL at 02/23/20 0733 .  lidocaine (XYLOCAINE) 2 % viscous mouth solution, , , ,  .  lipase/protease/amylase (CREON) capsule 36,000 Units, 36,000 Units, Oral, TID AC, Dhungel, Nishant, MD, 36,000 Units at 02/23/20 1236 .  midazolam (VERSED) 5 MG/5ML injection, , , ,  .  ondansetron (ZOFRAN) tablet 4 mg, 4 mg, Oral, Q6H PRN **OR** ondansetron (ZOFRAN) injection 4 mg, 4 mg, Intravenous, Q6H PRN, Dhungel, Nishant, MD, 4 mg at 02/16/20 1919 .   polyethylene glycol (MIRALAX / GLYCOLAX) packet 17 g, 17 g, Oral, BID, [name], MD, 17 g at 02/22/20 2337 .  senna (SENOKOT) tablet 8.6 mg, 1 tablet, Oral, QHS, [name], MD, 8.6 mg at 02/22/20 2342 .  sertraline (ZOLOFT) tablet 50 mg, 50 mg, Oral, Daily, Dhungel, Nishant, MD, 50 mg at 02/23/20 1245 .  sodium chloride flush (NS) 0.9 % injection 10-40 mL, 10-40 mL, Intracatheter, PRN, Fritzi Mandes, MD .  sodium chloride flush 0.9 % injection, , , ,  .  traMADol (ULTRAM) tablet 50 mg, 50 mg, Oral, Q6H PRN, [name], MD .  traZODone (DESYREL) tablet 50 mg, 50 mg, Oral, QHS, [name], MD, 50 mg at 02/22/20 2343    ALLERGIES   Latex     REVIEW OF SYSTEMS  Review of Systems:  Gen:  Denies  fever, sweats, chills weigh loss  HEENT: Denies blurred vision, double vision, ear pain, eye pain, hearing loss, nose bleeds, sore throat Cardiac:  No dizziness, chest pain or heaviness, chest tightness,edema Resp:   Denies cough or sputum porduction, shortness of breath,wheezing, hemoptysis,  Gi: Denies swallowing difficulty, stomach pain, nausea or vomiting, diarrhea, constipation, bowel incontinence Gu:  Denies bladder incontinence, burning urine Ext:   Denies Joint pain, stiffness or swelling Skin: Denies  skin rash, easy bruising or bleeding or hives Endoc:  Denies polyuria, polydipsia , polyphagia or weight change Psych:   Denies depression, insomnia or hallucinations   Other:  All other systems negative   VS: BP 127/90   Pulse 81   Temp 97.9 F (36.6 C) (Oral)   Resp 20   Ht 5\' 7"  (1.702 m) Comment: per chart  Wt 88 kg Comment: per chart  SpO2 95%   BMI 30.39 kg/m      PHYSICAL EXAM    GENERAL:NAD, no fevers, chills, no weakness no fatigue HEAD: Normocephalic, atraumatic.  EYES: Pupils equal, round, reactive to light. Extraocular muscles intact. No scleral icterus.  MOUTH: Moist mucosal membrane. Dentition intact. No abscess noted.   EAR, NOSE, THROAT: Clear without exudates. No external lesions.  NECK: Supple. No thyromegaly. No nodules. No JVD.  PULMONARY: decreased air entry bilaterlly without rhonchi CARDIOVASCULAR: S1 and S2. Regular rate and rhythm. No murmurs, rubs, or gallops. No edema. Pedal pulses 2+ bilaterally.  GASTROINTESTINAL: Soft, nontender, nondistended. No masses. Positive bowel sounds. No hepatosplenomegaly.  MUSCULOSKELETAL: No swelling, clubbing, or edema. Range of motion full in all extremities.  NEUROLOGIC: Cranial nerves II through XII are intact. No gross focal neurological deficits. Sensation intact. Reflexes intact.  SKIN: No ulceration, lesions, rashes, or cyanosis. Skin warm and dry. Turgor intact.  PSYCHIATRIC: Mood, affect within normal limits. The patient is awake, alert and oriented x 3. Insight, judgment intact.       IMAGING    MR THORACIC SPINE W WO CONTRAST  Result Date: 02/17/2020 CLINICAL DATA:  Back pain, MSSA bacteremia, assess for vertebral osteomyelitis, discitis; sepsis. EXAM: MRI THORACIC WITHOUT AND WITH CONTRAST TECHNIQUE: Multiplanar and multiecho pulse sequences of the thoracic spine were obtained without and with intravenous contrast. CONTRAST:  7.68mL GADAVIST GADOBUTROL 1 MMOL/ML IV SOLN COMPARISON:  CT angiogram chest 05/11/2014 FINDINGS: MRI THORACIC SPINE FINDINGS Multiple sequences are significantly motion degraded, limiting evaluation. Most notably, there is moderate to moderately severe motion degradation of the sagittal and axial T1 weighted postcontrast imaging and moderate motion degradation of the axial T2 weighted imaging. Alignment:  Normal Vertebrae: Vertebral body height is maintained. Within described limitations, no marrow edema or abnormal osseous enhancement is demonstrated. Cord: Within described limitations, no spinal cord signal abnormality is identified. Paraspinal and other soft tissues: T2 hyperintense signal abnormality within the dependent aspect of  the mid to lower lungs is nonspecific. No paraspinal soft tissue abnormality is identified. Disc levels: Mild disc degeneration throughout the thoracic spine. At T1-T2, small central disc protrusion without contact upon the spinal cord or significant canal stenosis. At T5-T6, there is a small right center disc protrusion which contacts the right ventral spinal cord. No significant central canal stenosis at this level. At T7-T8, tiny right center disc protrusion without canal stenosis. No significant focal disc herniation or spinal canal stenosis at the remaining levels. No significant foraminal stenosis within the thoracic spine. Multilevel ventrolateral osteophytes within the lower thoracic spine. IMPRESSION: 1.  Examination limited by significant motion degradation. 2. No appreciable findings of discitis/osteomyelitis within the thoracic spine. No paraspinal soft tissue abnormality is identified. 3. Mild thoracic spondylosis. Most notably, a small T5-T6 right center disc protrusion contacts the ventral spinal cord. No significant central canal stenosis or neural foraminal narrowing at any level. 4. T2 hyperintense signal abnormality within the dependent aspect of the mid to lower lungs is nonspecific, but may reflect atelectasis. Consider a repeat chest radiograph if the patient has respiratory symptoms. Electronically Signed   By: Kellie Simmering DO   On: 02/17/2020 14:42   MR Lumbar Spine W Wo Contrast  Result Date: 02/17/2020 CLINICAL DATA:  Back pain, MSSA bacteremia, assess for vertebral osteomyelitis, discitis; sepsis. EXAM: MRI LUMBAR SPINE WITHOUT AND WITH CONTRAST TECHNIQUE: Multiplanar and multiecho pulse sequences of the lumbar spine were obtained without and with intravenous contrast. CONTRAST:  7.54mL GADAVIST GADOBUTROL 1 MMOL/ML IV SOLN COMPARISON:  Lumbar spine MRI 06/10/2019 FINDINGS: Multiple sequences are significantly motion degraded, limiting evaluation. Most notably there is moderate to  moderately severe motion degradation of the sagittal and axial postcontrast T1 weighted imaging. Segmentation:  5 lumbar vertebrae. Alignment: Lumbar dextrocurvature. 2 mm L1-L2 grade 1 retrolisthesis. Trace L3-L4 retrolisthesis. Trace L4-L5 anterolisthesis. Vertebrae: Vertebral body height is maintained. There is mild endplate edema at [id number]. There is also patchy edema and enhancement within the posterior elements. Most notably, there is edema and somewhat prominent enhancement within the articular pillars along the L2-L3 and L3-L4 right facet joints (for instance as seen on series 21, image 6). Conus medullaris and cauda equina: Conus extends to the L1-L2 level. No signal abnormality within the visualized distal spinal cord. Within described limitations, no definite abnormal enhancement identified along the visualized distal spinal cord or cauda equina nerve roots. Paraspinal and other soft tissues: No abnormality identified within included portions of the abdomen/retroperitoneum. Atrophy of the lumbar paraspinal musculature. No other definite paraspinal soft tissue abnormality identified. Disc levels: Unless otherwise stated, the level by level findings below have not significantly changed since prior MRI 06/10/2019. Moderate L1-L2 disc degeneration. Mild disc degeneration at the remaining levels. T12-L1: No disc herniation. No significant canal or foraminal stenosis. L1-L2: Mild grade 1 retrolisthesis. Small disc bulge. No significant spinal canal or neural foraminal narrowing. L2-L3: Mild facet arthrosis. No disc herniation. No significant canal or foraminal stenosis. L3-L4: Trace retrolisthesis. Disc bulge. The disc bulge is slightly more prominent within the right subarticular zone. Mild facet arthrosis/ligamentum flavum hypertrophy. New from prior exam, there is mild right subarticular narrowing with slight crowding of the descending right L4 nerve root. Central canal patent. Mild left neural foraminal  narrowing, progressed. L4-L5: Trace anterolisthesis. Disc uncovering with superimposed small central disc protrusion. Moderate facet arthrosis with mild ligamentum flavum hypertrophy. Trace fluid within the bilateral facet joints. Mild bilateral subarticular and central canal narrowing without frank nerve root impingement. No significant foraminal stenosis. L5-S1: Small central disc protrusion. Moderate facet arthrosis. No significant spinal canal stenosis or neural foraminal narrowing. IMPRESSION: 1. Examination limited by significant motion degradation. 2. Mild endplate edema and enhancement at L3-L4 may be degenerative. 3. Patchy edema and enhancement within the lumbar posterior elements. Most notably there is edema and somewhat prominent enhancement within the articular pillars along the right L2-L3 and L3-L4 facet joints. There is significant facet arthrosis at these levels and findings may be degenerative. Infection cannot be definitively excluded, although there are no other findings at this level to suggest this on the current exam (i.e. sizable  facet joint effusion). 4. L3-L4 spondylosis has slightly progressed from 06/10/2019. There is now mild right subarticular stenosis at this level with slight crowding of the descending right L4 nerve root. Mild left neural foraminal narrowing at this level has also progressed. 5. Lumbar spondylosis is otherwise unchanged. Electronically Signed   By: Kellie Simmering DO   On: 02/17/2020 15:16   CT ABDOMEN PELVIS W CONTRAST  Result Date: 02/16/2020 CLINICAL DATA:  Nausea and vomiting EXAM: CT ABDOMEN AND PELVIS WITH CONTRAST TECHNIQUE: Multidetector CT imaging of the abdomen and pelvis was performed using the standard protocol following bolus administration of intravenous contrast. CONTRAST:  150mL OMNIPAQUE IOHEXOL 300 MG/ML  SOLN COMPARISON:  02/19/2019 FINDINGS: Lower chest: Mild right basilar atelectasis is noted. No sizable effusion is noted. Hepatobiliary: No  focal liver abnormality is seen. Status post cholecystectomy. No biliary dilatation. Pancreas: Unremarkable. No pancreatic ductal dilatation or surrounding inflammatory changes. Spleen: Normal in size without focal abnormality. Adrenals/Urinary Tract: Adrenal glands are within normal limits. Normal enhancement of the kidneys is seen bilaterally. Normal excretion is noted bilaterally. No obstructive changes are seen. The bladder is well distended. Stomach/Bowel: Diverticular change of the colon is noted without evidence of diverticulitis. The appendix is not well visualized consistent with the known surgical history. No small bowel obstructive changes are seen. The stomach is within normal limits. Vascular/Lymphatic: Mild ectasia of the infrarenal aorta to 2.9 cm is noted. Tortuosity is seen as well as diffuse atherosclerotic calcifications. No lymphadenopathy is seen. Reproductive: Status post hysterectomy. No adnexal masses. Other: No abdominal wall hernia or abnormality. No abdominopelvic ascites. Musculoskeletal: No acute or significant osseous findings. IMPRESSION: Mild right basilar atelectasis without sizable effusion. Diverticulosis without diverticulitis. Mild ectasia of the infrarenal aorta. Electronically Signed   By: Inez Catalina M.D.   On: 02/16/2020 12:28   DG Chest Port 1 View  Result Date: 02/22/2020 CLINICAL DATA:  Shortness of breath, cough EXAM: PORTABLE CHEST 1 VIEW COMPARISON:  02/16/2020 FINDINGS: Trace right pleural effusion. Bilateral mild interstitial thickening, right greater than left. No pneumothorax. Stable cardiomediastinal silhouette. No aggressive osseous lesion. IMPRESSION: Small right pleural effusion. Mild right basilar atelectasis. Bilateral interstitial thickening right greater than left which may reflect mild interstitial edema. Electronically Signed   By: Kathreen Devoid   On: 02/22/2020 10:40   DG Chest Portable 1 View  Result Date: 02/16/2020 CLINICAL DATA:  Fever and  altered mental status. EXAM: PORTABLE CHEST 1 VIEW COMPARISON:  07/09/2019 FINDINGS: Mild streaky atelectasis about the right minor fissure. There is no edema, consolidation, or pneumothorax. Blunting of the lateral right costophrenic sulcus. Cardiomegaly and aortic tortuosity. No edema, effusion, or pneumothorax. IMPRESSION: Minimal atelectasis and pleural fluid on the right. Electronically Signed   By: Monte Fantasia M.D.   On: 02/16/2020 11:12   ECHOCARDIOGRAM COMPLETE  Result Date: 02/17/2020    ECHOCARDIOGRAM REPORT   Patient Name:   Brandy Armstrong Date of Exam: 02/17/2020 Medical Rec #:  [id number]     Height:       67.0 in Accession #:    [id number]    Weight:       194.0 lb Date of Birth:  1946-03-02      BSA:          2.00 m Patient Age:    36 years      BP:           127/84 mmHg Patient Gender: F  HR:           107 bpm. Exam Location:  [facility] Procedure: 2D Echo, Cardiac Doppler and Color Doppler Indications:     bactermia 790.7  History:         Patient has prior history of Echocardiogram examinations, most                  recent 02/20/2019. COPD; Arrythmias:Atrial Fibrillation. Chest                  pain.  Sonographer:     Sherrie Sport RDCS (AE) Referring Phys:  [place] Diagnosing Phys: Ida Rogue MD  Sonographer Comments: Technically difficult study due to poor echo windows, no apical window and no subcostal window. IMPRESSIONS  1. Left ventricular ejection fraction, by estimation, is 60 to 65%. The left ventricle has normal function. The left ventricle has no regional wall motion abnormalities. Left ventricular diastolic parameters are indeterminate.  2. Right ventricular systolic function is normal. The right ventricular size is normal.  3. No valve vegetation noted FINDINGS  Left Ventricle: Left ventricular ejection fraction, by estimation, is 60 to 65%. The left ventricle has normal function. The left ventricle has no regional wall motion abnormalities. The left  ventricular internal cavity size was normal in size. There is  no left ventricular hypertrophy. Left ventricular diastolic parameters are indeterminate. Right Ventricle: The right ventricular size is normal. No increase in right ventricular wall thickness. Right ventricular systolic function is normal. Left Atrium: Left atrial size was normal in size. Right Atrium: Right atrial size was normal in size. Pericardium: There is no evidence of pericardial effusion. Mitral Valve: The mitral valve is normal in structure and function. Normal mobility of the mitral valve leaflets. No evidence of mitral valve regurgitation. No evidence of mitral valve stenosis. Tricuspid Valve: The tricuspid valve is normal in structure. Tricuspid valve regurgitation is not demonstrated. No evidence of tricuspid stenosis. Aortic Valve: The aortic valve is normal in structure and function. Aortic valve regurgitation is not visualized. Mild to moderate aortic valve sclerosis/calcification is present, without any evidence of aortic stenosis. Pulmonic Valve: The pulmonic valve was normal in structure. Pulmonic valve regurgitation is not visualized. No evidence of pulmonic stenosis. Aorta: The aortic root is normal in size and structure. Venous: The inferior vena cava is normal in size with greater than 50% respiratory variability, suggesting right atrial pressure of 3 mmHg. IAS/Shunts: No atrial level shunt detected by color flow Doppler.  LEFT VENTRICLE PLAX 2D LVIDd:         4.31 cm LVIDs:         3.06 cm LV PW:         1.19 cm LV IVS:        1.49 cm LVOT diam:     2.00 cm LV SV Index:   22.65 LVOT Area:     3.14 cm  LEFT ATRIUM         Index LA diam:    3.80 cm 1.90 cm/m                        PULMONIC VALVE AORTA                 RVOT Peak grad: 2 mmHg Ao Root diam: 3.00 cm   SHUNTS Systemic Diam: 2.00 cm Ida Rogue MD Electronically signed by Ida Rogue MD Signature Date/Time: 02/17/2020/3:57:33 PM    Final  ECHO TEE  Result  Date: 02/23/2020    TRANSESOPHOGEAL ECHO REPORT   Patient Name:   Brandy Armstrong Date of Exam: 02/23/2020 Medical Rec #:  [id number]     Height:       67.0 in Accession #:    [id number]    Weight:       194.0 lb Date of Birth:  05-02-1946      BSA:          1.996 m Patient Age:    4 years      BP:           134/82 mmHg Patient Gender: F             HR:           93 bpm. Exam Location:  [facility] Procedure: Transesophageal Echo, Cardiac Doppler and Color Doppler Indications:     Not listed  History:         Patient has prior history of Echocardiogram examinations, most                  recent 02/17/2020. COPD. Permanent Afib.  Sonographer:     Sherrie Sport RDCS (AE) Referring Phys:  [id number] Wonda Cheng NAHSER Diagnosing Phys: Kate Sable MD PROCEDURE: The transesophogeal probe was passed without difficulty through the esophogus of the patient. Local oropharyngeal anesthetic was provided with viscous lidocaine. Sedation performed by performing physician. Image quality was good. The patient developed no complications during the procedure. IMPRESSIONS  1. Left ventricular ejection fraction, by estimation, is 55 to 60%. The left ventricle has normal function. The left ventricle has no regional wall motion abnormalities. Left ventricular diastolic function could not be evaluated.  2. Right ventricular systolic function is normal. The right ventricular size is normal.  3. Left atrial size was mildly dilated. No left atrial/left atrial appendage thrombus was detected.  4. The mitral valve is normal in structure and function. Trivial mitral valve regurgitation.  5. The aortic valve is tricuspid. Aortic valve regurgitation is not visualized. Mild aortic valve sclerosis is present, with no evidence of aortic valve stenosis. Conclusion(s)/Recommendation(s): No evidence of vegetation/infective endocarditis on this transesophageal echocardiogram. FINDINGS  Left Ventricle: Left ventricular ejection fraction, by estimation, is 55 to 60%. The  left ventricle has normal function. The left ventricle has no regional wall motion abnormalities. The left ventricular internal cavity size was normal in size. There is  no left ventricular hypertrophy. Right Ventricle: The right ventricular size is normal. No increase in right ventricular wall thickness. Right ventricular systolic function is normal. Left Atrium: Left atrial size was mildly dilated. No left atrial/left atrial appendage thrombus was detected. Right Atrium: Right atrial size was normal in size. Pericardium: There is no evidence of pericardial effusion. Mitral Valve: The mitral valve is normal in structure and function. Trivial mitral valve regurgitation. There is no evidence of mitral valve vegetation. Tricuspid Valve: The tricuspid valve is normal in structure. Tricuspid valve regurgitation is trivial. There is no evidence of tricuspid valve vegetation. Aortic Valve: The aortic valve is tricuspid. Aortic valve regurgitation is not visualized. Mild aortic valve sclerosis is present, with no evidence of aortic valve stenosis. There is no evidence of aortic valve vegetation. Pulmonic Valve: The pulmonic valve was normal in structure. Pulmonic valve regurgitation is trivial. There is no evidence of pulmonic valve vegetation. Aorta: The aortic root is normal in size and structure. Venous: The inferior vena cava was not well visualized. IAS/Shunts: No atrial level shunt  detected by color flow Doppler. Kate Sable MD Electronically signed by Kate Sable MD Signature Date/Time: 02/23/2020/12:25:57 PM    Final    [place] EKG SITE RITE  Result Date: 02/23/2020 If [facility] image not attached, placement could not be confirmed due to current cardiac rhythm.     ASSESSMENT/PLAN   Acute on chronic hypoxemic respiratory failure -Unclear etiology possible COPD with concomitant acute on chronic diastolic CHF -Based on chest x-ray above interstitial infiltrates appear to be asymmetric with worse on  the right as well as pleural effusion on right, this can often be seen in cardiac related interstitial edema.  Trial of Lasix has been administered today.  Recommend strict I's and O's and monitoring of urine output.  Additional diuresis would be beneficial however patient is developing acute renal insufficiency.  Currently patient is net over 1 L positive.  Will administer once daily albumin starting today to improve diuretic efficacy.  Consider CT chest for better visualization of lung parenchyma as right hilar structures appear to be widened in mediastinum than expected and above chest x-ray. -Patient did require oxygen on arrival and is having cough intermittently-possibly due to acute COPD exacerbation secondary to sepsis with staph bacteremia -We will work on recruitment maneuvers to combat atelectatic segments at the bases, starting with MetaNeb with ipratropium every 4 hours -Encourage use of incentive spirometer few times each hour while hospitalized  Moderate acute COPD exacerbation -DuoNeb every 4 hours with MetaNeb -Chest physiotherapy and incentive spirometer   -Patient is currently on cefazolin -I will stop nonspecific beta-blocker propranolol and change to metoprolol due to bronchospasm.    Acute kidney injury-KDIGO stage 2  will d/c non-essential nephrotoxic medications - stopping multivitamins, protonix, NSAIDs -will administer albumin 1amp daily - monitor renal function and urine output - plan for ongoing diuresis   Sepsis with MSSA bacteremia -Patient is being worked up with infectious disease, plan is for IV cefazolin for 4 to 6 weeks. -TEE for native valve vegetation eval   AF and HFpEF   - cardiology on case - appreciate collaboration    - currently on CCB and BB(switched from prop to metop tart today)   - eliquis for [facility]    - TEE for veg     Thank you for allowing me to participate in the care of this patient.   Patient/Family are satisfied with care plan and  all questions have been answered.   This document was prepared using Dragon voice recognition software and may include unintentional dictation errors.     Ottie Glazier, M.D.  Division of [place]

## 2020-02-23 NOTE — Discharge Summary (Signed)
Brandy Armstrong at [place] NAME: Brandy Armstrong    MR#:  [id number]  DATE OF BIRTH:  1946-07-13  DATE OF ADMISSION:  02/16/2020 ADMITTING PHYSICIAN: Brandy Cota, MD  DATE OF DISCHARGE: 02/23/2020  PRIMARY CARE PHYSICIAN: Brandy Hawthorne, FNP    ADMISSION DIAGNOSIS:  Sepsis ([place]) [A41.9] Sepsis without acute organ dysfunction, due to unspecified organism ([place]) [A41.9]  DISCHARGE DIAGNOSIS:  MSSA Sepsis Acute hypoxic respiratory failure secondary to COPD flare and acute on chronic diastolic congestive heart failure-- improved  SECONDARY DIAGNOSIS:   Past Medical History:  Diagnosis Date  . A-fib ([place])   . Barretts esophagus   . Chest pain    a. 02/2014 Myoview: Ef 50%, no ischemia.  . Colon polyps   . COPD (chronic obstructive pulmonary disease) ([place])   . Depression with anxiety   . GERD (gastroesophageal reflux disease)   . Hx of benign essential tremor   . Neuropathy   . Permanent atrial fibrillation [facility])    a. s/p ablation 01/2012 in [facility] by Dr. Boyd Kerbs;  b. On sotalol & Xarelto;  c. 02/2014 Echo: EF 50-55%, mild conc LVH, nl LA size/structure;  d. Recurrent afib 8/15 & 08/29/2014.  [name] Rectal fistula   . Rheumatoid arthritis ([place])    On methotrexate and orencia  . Urinary incontinence   . Vitamin D deficiency     HOSPITAL COURSE:  74 year old woman multiple medical problems outlined below, presented with confusion for approximately 48 hours, subjective chills, right flank pain, fever, dysuria. Admitted for sepsis secondary to UTI, acute pyelonephritis  Sepsis secondary to MSSA bacteremia--unclear etiology-2D echocardiogram reassuring. No valvular vegetation -MRI thoracic and lumbar spine did not show any clear evidence of infection/discitis --Remains afebrile. Mentation appears to be at baseline--occ confusion -- UC--multiple species --Continue cefazolin, follow-up blood cultures are negative so far --- PICC line  today -Appreciate infectious disease recommendations. --TEE  negative for endocarditis  Thoracic back pain.  -Secondary to degenerative disease as seen on MRI of lumbar and thoracic spine. --Continue Tylenol and tramadol.  --Could challenge with a lower dose of pain is uncontrolled with tramadol. --Continue PT  Acute hypoxic respiratory failure due to COPD Flare with acute on chronic HFpEF -Continue home inhaler --recieved Solu-Medrol 60 mg IV BID -- now d/ced -cont duo nebs prn - BNP 516--give Lasix 40 mg x1--uop 1300 cc -sats 96% on RA -CXR 2/23 showed some congestion -Pulmonary Dr Lanney Gins 's input appreciated--gave IV albumin to improve diuretic effect. Will d/c it now  Chronic normocytic anemia, per oncology note iron deficiency, -due to chronic blood loss complicated by chronic anticoagulation. -Appears to be close to baseline.  -Followed by hematology as an outpatient. No history of bleeding. -Hemoglobin stable. No further inpatient evaluation suggested.  Atrial fibrillation on chronic anticoagulation --Intermittent mild tachycardia. -Continuepropranolol (on for tremors apparently).  --Continueapixaban -- patient remains tachycardic will add Cardizem 30 mg Q6 hourly--changed  to Cardizem CD 120 mg. HR 70-90  Rheumatoid arthritis on Humira and plaquenil -- pt follows with Dr Meda Coffee at [facility]. She will set up follow-up appointment after discharge from rehab. -- Continue RA meds  Depression --Continue BuSpar, Zoloft & trazodone  Pancreatic insufficiency, chronic --ContinueCreon  Obesity unspecified --Continue Ensure per dietitian  Disposition Plan:to peak today after PICC line placement DVT prophylaxis:apixaban Code Status:Full Family Communication: Brandy Armstrong by telephone agrees with plan   CONSULTS OBTAINED:  Treatment Team:  Nahser, Wonda Cheng, MD Ottie Glazier, MD  DRUG  ALLERGIES:   Allergies  Allergen Reactions  . Latex      Itching Itching    DISCHARGE MEDICATIONS:   Allergies as of 02/23/2020      Reactions   Latex    Itching Itching      Medication List    TAKE these medications   busPIRone 10 MG tablet Commonly known as: BUSPAR Take 1 tablet (10 mg total) by mouth 3 (three) times daily.   ceFAZolin  IVPB Commonly known as: ANCEF Inject 2 g into the vein every 8 (eight) hours. Indication:  MSSA bacteremia Last Day of Therapy:  03/26/2020 Labs - Once weekly:  CBC/D, CMP, CRP Please pull PIC at completion of IV antibiotics   Creon 36000 UNITS Cpep capsule Generic drug: lipase/protease/amylase Takes 2 capsules three times daily.   cyclobenzaprine 5 MG tablet Commonly known as: FLEXERIL Take 1 tablet (5 mg total) by mouth 3 (three) times daily as needed for muscle spasms.   diltiazem 120 MG 24 hr capsule Commonly known as: CARDIZEM CD Take 1 capsule (120 mg total) by mouth daily.   Eliquis 5 MG Tabs tablet Generic drug: apixaban Take 1 tablet (5 mg total) by mouth 2 (two) times daily.   esomeprazole 20 MG capsule Commonly known as: NEXIUM TAKE 1 CAPSULE BY MOUTH EVERY DAY   gabapentin 600 MG tablet Commonly known as: NEURONTIN TAKE 1 TABLET BY MOUTH THREE TIMES A DAY   Humira Pen 40 MG/0.4ML Pnkt Generic drug: Adalimumab Inject 40 mg as directed. Every 2 weeks   hydrochlorothiazide 25 MG tablet Commonly known as: HYDRODIURIL Take 1 tablet (25 mg total) by mouth daily.   hydroxychloroquine 200 MG tablet Commonly known as: PLAQUENIL Take 200 mg by mouth 2 (two) times daily.   hydrOXYzine 50 MG tablet Commonly known as: ATARAX/VISTARIL Take 0.5-1 tablets (25-50 mg total) by mouth daily as needed for anxiety. Patient has supplies What changed:  how much to take when to take this additional instructions   hyoscyamine 0.125 MG tablet Commonly known as: LEVSIN Take 0.125 mg by mouth as needed.   propranolol ER 120 MG 24 hr capsule Commonly known as: INDERAL LA Take  1 capsule (120 mg total) by mouth daily.   sertraline 50 MG tablet Commonly known as: ZOLOFT TAKE 1 TABLET BY MOUTH EVERY DAY   traZODone 50 MG tablet Commonly known as: DESYREL Take 1 tablet (50 mg total) by mouth at bedtime.   Trelegy Ellipta 100-62.5-25 MCG/INH Aepb Generic drug: Fluticasone-Umeclidin-Vilant Inhale 1 puff into the lungs daily.            Home Infusion Instuctions  (From admission, onward)         Start     Ordered   02/23/20 0000  Home infusion instructions    Question:  Instructions  Answer:  Flushing of vascular access device: 0.9% NaCl pre/post medication administration and prn patency; Heparin 100 u/ml, 24ml for implanted ports and Heparin 10u/ml, 46ml for all other central venous catheters.   02/23/20 1215          If you experience worsening of your admission symptoms, develop shortness of breath, life threatening emergency, suicidal or homicidal thoughts you must seek medical attention immediately by calling 911 or calling your MD immediately  if symptoms less severe.  You Must read complete instructions/literature along with all the possible adverse reactions/side effects for all the Medicines you take and that have been prescribed to you. Take any new Medicines after you have completely  understood and accept all the possible adverse reactions/side effects.   Please note  You were cared for by a hospitalist during your hospital stay. If you have any questions about your discharge medications or the care you received while you were in the hospital after you are discharged, you can call the unit and asked to speak with the hospitalist on call if the hospitalist that took care of you is not available. Once you are discharged, your primary care physician will handle any further medical issues. Please note that NO REFILLS for any discharge medications will be authorized once you are discharged, as it is imperative that you return to your primary care  physician (or establish a relationship with a primary care physician if you do not have one) for your aftercare needs so that they can reassess your need for medications and monitor your lab values.   DATA REVIEW:   CBC  Recent Labs  Lab 02/20/20 0416  WBC 7.0  HGB 9.5*  HCT 31.4*  PLT 177    Chemistries  Recent Labs  Lab 02/20/20 0416  NA 134*  K 3.6  CL 99  CO2 26  GLUCOSE 100*  BUN 20  CREATININE 0.77  CALCIUM 8.7*    Microbiology Results   Recent Results (from the past 240 hour(s))  Culture, blood (Routine x 2)     Status: Abnormal   Collection Time: 02/16/20 10:52 AM   Specimen: BLOOD  Result Value Ref Range Status   Specimen Description BLOOD RIGHT ANTECUBITAL  Final   Special Requests   Final    BOTTLES DRAWN AEROBIC AND ANAEROBIC Blood Culture results may not be optimal due to an excessive volume of blood received in culture bottles   Culture  Setup Time   Final    GRAM POSITIVE COCCI IN BOTH AEROBIC AND ANAEROBIC BOTTLES CRITICAL RESULT CALLED TO, READ BACK BY AND VERIFIED WITH: Hart Robinsons PHARMD 0011 02/17/20 HNM Performed at [facility], [address] [place], [place] [address]    Culture STAPHYLOCOCCUS AUREUS (A)  Final   Report Status 02/19/2020 FINAL  Final   Organism ID, Bacteria STAPHYLOCOCCUS AUREUS  Final      Susceptibility   Staphylococcus aureus - MIC*    CIPROFLOXACIN <=0.5 SENSITIVE Sensitive     ERYTHROMYCIN <=0.25 SENSITIVE Sensitive     GENTAMICIN <=0.5 SENSITIVE Sensitive     OXACILLIN <=0.25 SENSITIVE Sensitive     TETRACYCLINE >=16 RESISTANT Resistant     VANCOMYCIN <=0.5 SENSITIVE Sensitive     TRIMETH/SULFA <=10 SENSITIVE Sensitive     CLINDAMYCIN <=0.25 SENSITIVE Sensitive     RIFAMPIN <=0.5 SENSITIVE Sensitive     Inducible Clindamycin NEGATIVE Sensitive     * STAPHYLOCOCCUS AUREUS  Culture, blood (Routine x 2)     Status: Abnormal   Collection Time: 02/16/20 10:52 AM   Specimen: BLOOD  Result Value Ref Range  Status   Specimen Description BLOOD BLOOD RIGHT FOREARM  Final   Special Requests   Final    BOTTLES DRAWN AEROBIC AND ANAEROBIC Blood Culture adequate volume   Culture  Setup Time   Final    GRAM POSITIVE COCCI IN BOTH AEROBIC AND ANAEROBIC BOTTLES Organism ID to follow CRITICAL VALUE NOTED.  VALUE IS CONSISTENT WITH PREVIOUSLY REPORTED AND CALLED VALUE. Performed at [facility], [address] [place], [place] [address]    Culture (A)  Final    STAPHYLOCOCCUS AUREUS SUSCEPTIBILITIES PERFORMED ON PREVIOUS CULTURE WITHIN THE LAST 5 DAYS.  Report Status 02/19/2020 FINAL  Final  Blood Culture ID Panel (Reflexed)     Status: Abnormal   Collection Time: 02/16/20 10:52 AM  Result Value Ref Range Status   Enterococcus species NOT DETECTED NOT DETECTED Final   Listeria monocytogenes NOT DETECTED NOT DETECTED Final   Staphylococcus species DETECTED (A) NOT DETECTED Final    Comment: CRITICAL RESULT CALLED TO, READ BACK BY AND VERIFIED WITH: SCOTT HALL PHARMD 0011 02/17/20 HNM    Staphylococcus aureus (BCID) DETECTED (A) NOT DETECTED Final    Comment: Methicillin (oxacillin) susceptible Staphylococcus aureus (MSSA). Preferred therapy is anti staphylococcal beta lactam antibiotic (Cefazolin or Nafcillin), unless clinically contraindicated. CRITICAL RESULT CALLED TO, READ BACK BY AND VERIFIED WITH: SCOTT HALL PHARMD 011 02/17/20 HNM    Methicillin resistance NOT DETECTED NOT DETECTED Final   Streptococcus species NOT DETECTED NOT DETECTED Final   Streptococcus agalactiae NOT DETECTED NOT DETECTED Final   Streptococcus pneumoniae NOT DETECTED NOT DETECTED Final   Streptococcus pyogenes NOT DETECTED NOT DETECTED Final   Acinetobacter baumannii NOT DETECTED NOT DETECTED Final   Enterobacteriaceae species NOT DETECTED NOT DETECTED Final   Enterobacter cloacae complex NOT DETECTED NOT DETECTED Final   Escherichia coli NOT DETECTED NOT DETECTED Final   Klebsiella oxytoca NOT  DETECTED NOT DETECTED Final   Klebsiella pneumoniae NOT DETECTED NOT DETECTED Final   Proteus species NOT DETECTED NOT DETECTED Final   Serratia marcescens NOT DETECTED NOT DETECTED Final   Haemophilus influenzae NOT DETECTED NOT DETECTED Final   Neisseria meningitidis NOT DETECTED NOT DETECTED Final   Pseudomonas aeruginosa NOT DETECTED NOT DETECTED Final   Candida albicans NOT DETECTED NOT DETECTED Final   Candida glabrata NOT DETECTED NOT DETECTED Final   Candida krusei NOT DETECTED NOT DETECTED Final   Candida parapsilosis NOT DETECTED NOT DETECTED Final   Candida tropicalis NOT DETECTED NOT DETECTED Final    Comment: Performed at [facility], [address] [place], [place] [address]  Urine culture     Status: Abnormal   Collection Time: 02/16/20 12:33 PM   Specimen: Urine, Clean Catch  Result Value Ref Range Status   Specimen Description   Final    URINE, CLEAN CATCH Performed at [facility], [address] [place], [place] [address]    Special Requests   Final    NONE Performed at [facility], [address] [place], [place] [address]    Culture MULTIPLE SPECIES PRESENT, SUGGEST RECOLLECTION (A)  Final   Report Status 02/17/2020 FINAL  Final  Respiratory Panel by RT PCR (Flu A&B, Covid) - Nasopharyngeal Swab     Status: None   Collection Time: 02/16/20  2:41 PM   Specimen: Nasopharyngeal Swab  Result Value Ref Range Status   SARS Coronavirus 2 by RT PCR NEGATIVE NEGATIVE Final    Comment: (NOTE) SARS-CoV-2 target nucleic acids are NOT DETECTED. The SARS-CoV-2 RNA is generally detectable in upper respiratoy specimens during the acute phase of infection. The lowest concentration of SARS-CoV-2 viral copies this assay can detect is 131 copies/mL. A negative result does not preclude SARS-Cov-2 infection and should not be used as the sole basis for treatment or other patient management decisions. A negative result may occur with  improper  specimen collection/handling, submission of specimen other than nasopharyngeal swab, presence of viral mutation(s) within the areas targeted by this assay, and inadequate number of viral copies (<131 copies/mL). A negative result must be combined with clinical observations, patient history, and epidemiological information. The  expected result is Negative. Fact Sheet for Patients:  PinkCheek.be Fact Sheet for Healthcare Providers:  GravelBags.it This test is not yet ap proved or cleared by the [place] FDA and  has been authorized for detection and/or diagnosis of SARS-CoV-2 by FDA under an Emergency Use Authorization (EUA). This EUA will remain  in effect (meaning this test can be used) for the duration of the COVID-19 declaration under Section 564(b)(1) of the Act, 21 U.S.C. section 360bbb-3(b)(1), unless the authorization is terminated or revoked sooner.    Influenza A by PCR NEGATIVE NEGATIVE Final   Influenza B by PCR NEGATIVE NEGATIVE Final    Comment: (NOTE) The Xpert Xpress SARS-CoV-2/FLU/RSV assay is intended as an aid in  the diagnosis of influenza from Nasopharyngeal swab specimens and  should not be used as a sole basis for treatment. Nasal washings and  aspirates are unacceptable for Xpert Xpress SARS-CoV-2/FLU/RSV  testing. Fact Sheet for Patients: PinkCheek.be Fact Sheet for Healthcare Providers: GravelBags.it This test is not yet approved or cleared by the [place] FDA and  has been authorized for detection and/or diagnosis of SARS-CoV-2 by  FDA under an Emergency Use Authorization (EUA). This EUA will remain  in effect (meaning this test can be used) for the duration of the  Covid-19 declaration under Section 564(b)(1) of the Act, 21  U.S.C. section 360bbb-3(b)(1), unless the authorization is  terminated or revoked. Performed at [facility], [address] [place], [place] [address]   Culture, blood (Routine X 2) w Reflex to ID Panel     Status: None (Preliminary result)   Collection Time: 02/19/20 12:00 AM   Specimen: BLOOD  Result Value Ref Range Status   Specimen Description BLOOD LEFT ANTECUBITAL  Final   Special Requests   Final    BOTTLES DRAWN AEROBIC AND ANAEROBIC Blood Culture adequate volume   Culture   Final    NO GROWTH 4 DAYS Performed at [facility], [address] [place], [place] [address]    Report Status PENDING  Incomplete  Culture, blood (Routine X 2) w Reflex to ID Panel     Status: None (Preliminary result)   Collection Time: 02/19/20 12:05 AM   Specimen: BLOOD  Result Value Ref Range Status   Specimen Description BLOOD LEFT HAND  Final   Special Requests   Final    BOTTLES DRAWN AEROBIC AND ANAEROBIC Blood Culture adequate volume   Culture   Final    NO GROWTH 4 DAYS Performed at [facility], [address], [place], [place] [address]    Report Status PENDING  Incomplete    RADIOLOGY:  DG Chest Port 1 View  Result Date: 02/22/2020 CLINICAL DATA:  Shortness of breath, cough EXAM: PORTABLE CHEST 1 VIEW COMPARISON:  02/16/2020 FINDINGS: Trace right pleural effusion. Bilateral mild interstitial thickening, right greater than left. No pneumothorax. Stable cardiomediastinal silhouette. No aggressive osseous lesion. IMPRESSION: Small right pleural effusion. Mild right basilar atelectasis. Bilateral interstitial thickening right greater than left which may reflect mild interstitial edema. Electronically Signed   By: Kathreen Devoid   On: 02/22/2020 10:40   ECHO TEE  Result Date: 02/23/2020 Kate Sable, MD     02/23/2020 10:15 AM Transesophageal Echocardiogram : Indication: bacteremia Procedure: 4cc of viscous lidocaine were given orally to provide local anesthesia to the oropharynx. The patient was positioned supine on the left side, bite block provided. The  patient was moderately sedated with the doses of versed and fentanyl as detailed  below.  Using digital technique an omniplane probe was advanced into the esophagus without incident. Moderate sedation: 1. Sedation used:  Versed: 2mg , Fentanyl: 35mcg 2. Time administered:   9:50  Time when patient started recovery:10:10 3. I was face to face during this time, total of 27mins See report in EPIC  for complete details: In brief, imaging revealed no evidence for endocarditis. There was normal LV function with no RWMAs and no mural apical thrombus. Estimated ejection fraction was 60%.  Right sided cardiac chambers were normal with no evidence of pulmonary hypertension. Imaging of the septum showed no ASD or VSD 2D and color flow confirmed no PFO The LA was well visualized in orthogonal views.  There was no thrombus in the LA and LA appendage The descending thoracic aorta had no  mural aortic debris with no evidence of aneurysmal dilation or disection Aaron Edelman Agbor-Etang 02/23/2020 10:10 AM   [place] EKG SITE RITE  Result Date: 02/23/2020 If Site Rite image not attached, placement could not be confirmed due to current cardiac rhythm.    CODE STATUS:     Code Status Orders  (From admission, onward)         Start     Ordered   02/16/20 1403  Full code  Continuous     02/16/20 1404        Code Status History    Date Active Date Inactive Code Status Order ID Comments User Context   02/19/2019 0955 03/09/2019 0003 Full Code [id number]  Herbert Pun, MD ED   08/13/2016 2338 08/14/2016 1818 Full Code [id number]  Gladstone Lighter, MD Inpatient   05/04/2015 1355 05/08/2015 2116 Full Code [id number]  Loletha Grayer, MD ED   Advance Care Planning Activity    Advance Directive Documentation     Most Recent Value  Type of Advance Directive  Living will  Pre-existing out of facility DNR order (yellow form or pink MOST form)  --  "MOST" Form in Place?  --       TOTAL TIME TAKING CARE OF THIS PATIENT: *40*  minutes.    Fritzi Mandes M.D  [facility]    CC: Primary care physician; Brandy Hawthorne, FNP

## 2020-02-23 NOTE — Consult Note (Signed)
Pharmacy Antibiotic Note  Brandy Armstrong is a 74 y.o. female admitted on 02/16/2020 with sepsis & bacteremia.  Pharmacy has been consulted for Ancef dosing.   BCID: MSSA. Source unknown, though awaiting TEE.  TEE was held on yesterday due to respiration.   Plan:  Continue  Cefazolin  2gm IV q8hrs, per ID if TEE cannot be done d/t respiratory status, the patient will be on current regimen for 6 weeks.  Will continue to monitor renal function and progress  Height: 5\' 7"  (170.2 cm)(per chart) Weight: 194 lb 0.1 oz (88 kg)(per chart) IBW/kg (Calculated) : 61.6  Temp (24hrs), Avg:98.3 F (36.8 C), Min:98.2 F (36.8 C), Max:98.5 F (36.9 C)  Recent Labs  Lab 02/16/20 1052 02/17/20 0507 02/18/20 0250 02/20/20 0416  WBC 8.0 6.7 6.0 7.0  CREATININE 1.13* 0.79 0.80 0.77  LATICACIDVEN 1.1  --   --   --     Estimated Creatinine Clearance: 73.5 mL/min (by C-G formula based on SCr of 0.77 mg/dL).    Allergies  Allergen Reactions  . Latex     Itching Itching   Antimicrobials this admission: Vancomycin 2/17 >> 2/18 Cefepime 2/17 >> 2/18 Cefazolin  2/18 >>  Microbiology results: 2/17 BCx: lab reports 4 of 4 bottles + for GPC, Staph aureus, MecA not detected BCID: MSSA  2/17 UCx: multiple species  Thank you for allowing pharmacy to be a part of this patient's care.  Rowland Lathe, PharmD Clinical Pharmacist 02/23/2020 7:57 AM

## 2020-02-23 NOTE — TOC Progression Note (Addendum)
Transition of Care [facility]) - Progression Note    Patient Details  Name: Brandy Armstrong MRN: [id number] Date of Birth: May 27, 1946  Transition of Care [facility]) CM/SW Palmer, LCSW Phone Number: 02/23/2020, 9:08 AM  Clinical Narrative: Patient's HCPOA stated that out of current SNF options, they will likely go with [facility]. She just wants to confirm with patient first. Patient down for TEE right now. Will notify her when it looks like patient is back on the unit. Gave [facility] admissions coordinator a heads up.   1:15 pm: Received email from [facility] that patient is agreeable to [facility]. Facility is aware. Notified [facility]. Received voicemail that they needed discharge antibiotic information. Left message with that information for the care coordinator.     2:24 pm: Went by room to update patient. PICC not placed yet. Auth still pending. COVID test still pending.  4:07 pm: COVID results are negative. Auth [id number]. Patient can discharge to [facility] once picc placed. Charge nurse is having someone call the IV team. Patient aware. Sent email to her HCPOA to notify.  Expected Discharge Plan and Services                                                 Social Determinants of Health (SDOH) Interventions    Readmission Risk Interventions No flowsheet data found.

## 2020-02-23 NOTE — Discharge Instructions (Signed)
PICC line care per protocol °

## 2020-02-23 NOTE — Progress Notes (Signed)
RN Report called to [name] at [facility] to 800 hall for patient admitting to room 808.  Report given no issues or concerns.  2nd COVID test was negative. Awaiting verification of PICC placement prior to calling EMS for transport to facility.  RN aware.

## 2020-02-23 NOTE — Progress Notes (Signed)
ID  Pt doing much better Says back pain is better Was able to sit on the edge of bed No fever Had TEE today    O/e awake and alert Oriented X4 Responds to questions and commands appropriately BP 127/90   Pulse 81   Temp 97.9 F (36.6 C) (Oral)   Resp 20   Ht '5\' 7"'$  (1.702 m) Comment: per chart  Wt 88 kg Comment: per chart  SpO2 95%   BMI 30.39 kg/m    Chest b/l air entry [id number] Abd soft CNS non focal  Labs CBC Latest Ref Rng & Units 02/20/2020 02/18/2020 02/17/2020  WBC 4.0 - 10.5 K/uL 7.0 6.0 6.7  Hemoglobin 12.0 - 15.0 g/dL 9.5(L) 9.8(L) 9.7(L)  Hematocrit 36.0 - 46.0 % 31.4(L) 32.4(L) 32.3(L)  Platelets 150 - 400 K/uL 177 141(L) 120(L)    CMP Latest Ref Rng & Units 02/20/2020 02/18/2020 02/17/2020  Glucose 70 - 99 mg/dL 100(H) 83 90  BUN 8 - 23 mg/dL '20 18 20  '$ Creatinine 0.44 - 1.00 mg/dL 0.77 0.80 0.79  Sodium 135 - 145 mmol/L 134(L) 136 135  Potassium 3.5 - 5.1 mmol/L 3.6 3.7 3.5  Chloride 98 - 111 mmol/L 99 104 106  CO2 22 - 32 mmol/L '26 24 22  '$ Calcium 8.9 - 10.3 mg/dL 8.7(L) 8.5(L) 7.7(L)  Total Protein 6.5 - 8.1 g/dL - - -  Total Bilirubin 0.3 - 1.2 mg/dL - - -  Alkaline Phos 38 - 126 U/L - - -  AST 15 - 41 U/L - - -  ALT 0 - 44 U/L - - -    Microbiology:  2/17 [facility] MSSA 2/20 BC NG  Mild endplate edema and enhancement at L3-L4 may be degenerative.3. Patchy edema and enhancement within the lumbar posterior elements. Most notably there is edema and somewhat prominent enhancement within the articular pillars along the right L2-L3 and L3-L4 facet joints. There is significant facet arthrosis at these levels and findings may be degenerative. 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  It was thought to be degenerative but infection could not be ruled out  She will need a minimum of 4 weeks-6weeks  of IV antibiotic OPAT orders placed- continue cefazolin until 03/26/20- may need more IV  Will need weekly ESR/labs Will follow as OP in 4 weeks   Encephalopathy- has resolved  Acute hypoxic resp failure  -combination of COPD  And chronic HfpEF - had exacerbation and seen by pulmonary and is much improved with Rx of both with lasix, solumedrol, nebs, IV albumin  Afib -on apixiban  Rheumatoid arthritis- she is followed at [facility] clinic by [facility]- may need clarification on her meds- if she is on plaquenil that can be continued- TNFI, or methotrexate should not be given because of the acute infection  Discussed the management with patient and care team

## 2020-02-23 NOTE — Treatment Plan (Signed)
Diagnosis: MSSA bacteremia Baseline Creatinine 0.77    Allergies  Allergen Reactions  . Latex     Itching Itching    OPAT Orders Discharge antibiotics: Cefazolin 2 grams IV every 8 hours until 03/26/20  [facility] Care Per Protocol:  Labs weekly while on IV antibiotics: _X_ CBC with differential _X__ CMP _X_ CRP _X_ ESR   _X_ Please pull PIC at completion of IV antibiotics  Fax weekly labs to Dr.Quayshaun Hubbert to ( [phone]  Call Dr.Adreona Brand or her covering provider Dr.Fitzgerald at [phone] with any questions

## 2020-02-23 NOTE — Consult Note (Signed)
PHARMACY CONSULT NOTE FOR:  OUTPATIENT  PARENTERAL ANTIBIOTIC THERAPY (OPAT)  Indication: MSSA bacteremia Regimen: Cefazolin 2 grams IV every 8 hours End date:  03/26/20  IV antibiotic discharge orders are pended. To discharging provider:  please sign these orders via discharge navigator,  Select New Orders & click on the button choice - Manage This Unsigned Work.     Thank you for allowing pharmacy to be a part of this patient's care.  Brandy Armstrong 02/23/2020, 11:54 AM

## 2020-02-23 NOTE — Progress Notes (Signed)
Progress Note  Patient Name: Brandy Armstrong Date of Encounter: 02/23/2020  Primary Cardiologist: Ida Rogue, MD   Subjective   Patient states feeling much better today.  Her breathing and coughing is much better after her breathing treatment.  She currently denies shortness of breath or cough.  Inpatient Medications    Scheduled Meds: . [MAR Hold] apixaban  5 mg Oral BID  . [MAR Hold] busPIRone  10 mg Oral TID  . butamben-tetracaine-benzocaine      . [MAR Hold] diltiazem  120 mg Oral Daily  . [MAR Hold] feeding supplement (ENSURE ENLIVE)  237 mL Oral BID BM  . fentaNYL      . [MAR Hold] fluticasone furoate-vilanterol  1 puff Inhalation Daily   And  . [MAR Hold] umeclidinium bromide  1 puff Inhalation Daily  . [MAR Hold] gabapentin  600 mg Oral TID  . [MAR Hold] insulin aspart  0-5 Units Subcutaneous QHS  . [MAR Hold] insulin aspart  0-9 Units Subcutaneous TID WC  . [MAR Hold] ipratropium-albuterol  3 mL Nebulization Q4H  . lidocaine      . [MAR Hold] lipase/protease/amylase  36,000 Units Oral TID AC  . [MAR Hold] metoprolol tartrate  25 mg Oral BID  . midazolam      . [MAR Hold] polyethylene glycol  17 g Oral BID  . [MAR Hold] senna  1 tablet Oral QHS  . [MAR Hold] sertraline  50 mg Oral Daily  . sodium chloride flush      . [MAR Hold] traZODone  50 mg Oral QHS   Continuous Infusions: . sodium chloride    . [MAR Hold] sodium chloride Stopped (02/21/20 1934)  . [MAR Hold]  ceFAZolin (ANCEF) IV 2 g (02/23/20 0200)   PRN Meds: [MAR Hold] sodium chloride, [MAR Hold] acetaminophen **OR** [MAR Hold] acetaminophen, [MAR Hold] bisacodyl, [MAR Hold] ipratropium-albuterol, [MAR Hold] ondansetron **OR** [MAR Hold] ondansetron (ZOFRAN) IV, [MAR Hold] traMADol   Vital Signs    Vitals:   02/22/20 2040 02/23/20 0002 02/23/20 0439 02/23/20 0854  BP: 125/75 126/80 129/90 (!) 134/101  Pulse: 92 (!) 106 78 87  Resp: 20 20 20  (!) 24  Temp: 98.2 F (36.8 C)  98.5 F (36.9 C)  98.3 F (36.8 C)  TempSrc: Oral  Oral Oral  SpO2: 95%  96% 90%  Weight:      Height:        Intake/Output Summary (Last 24 hours) at 02/23/2020 0931 Last data filed at 02/23/2020 0500 Gross per 24 hour  Intake 548.13 ml  Output 1300 ml  Net -751.87 ml   Last 3 Weights 02/21/2020 02/16/2020 02/14/2020  Weight (lbs) 194 lb 0.1 oz 194 lb 0.1 oz 191 lb  Weight (kg) 88 kg 88 kg 86.637 kg      Telemetry    Currently not on telemetry- Personally Reviewed  ECG    No new tracing obtained- Personally Reviewed  Physical Exam   GEN: No acute distress.   Neck: No JVD Cardiac:  Irregular irregular, no murmurs, rubs, or gallops.  Respiratory:  Decreased breath sounds at the right lung base GI: Soft, nontender, non-distended  MS: No edema; No deformity. Neuro:  Nonfocal  Psych: Normal affect   Labs    High Sensitivity Troponin:  No results for input(s): TROPONINIHS in the last 720 hours.    Chemistry Recent Labs  Lab 02/16/20 1052 02/16/20 1052 02/17/20 0507 02/18/20 0250 02/20/20 0416  NA 130*   < > 135 136 134*  K 4.0   < > 3.5 3.7 3.6  CL 98   < > 106 104 99  CO2 20*   < > 22 24 26   GLUCOSE 114*   < > 90 83 100*  BUN 30*   < > 20 18 20   CREATININE 1.13*   < > 0.79 0.80 0.77  CALCIUM 9.1   < > 7.7* 8.5* 8.7*  PROT 7.3  --   --   --   --   ALBUMIN 3.3*  --   --   --   --   AST 21  --   --   --   --   ALT 9  --   --   --   --   ALKPHOS 82  --   --   --   --   BILITOT 1.3*  --   --   --   --   GFRNONAA 49*   < > >60 >60 >60  GFRAA 57*   < > >60 >60 >60  ANIONGAP 12   < > 7 8 9    < > = values in this interval not displayed.     Hematology Recent Labs  Lab 02/17/20 0507 02/18/20 0250 02/20/20 0416  WBC 6.7 6.0 7.0  RBC 3.99 4.04 3.89  HGB 9.7* 9.8* 9.5*  HCT 32.3* 32.4* 31.4*  MCV 81.0 80.2 80.7  MCH 24.3* 24.3* 24.4*  MCHC 30.0 30.2 30.3  RDW 18.1* 18.2* 18.4*  PLT 120* 141* 177    BNP Recent Labs  Lab 02/22/20 0943  BNP 519.0*     DDimer No  results for input(s): DDIMER in the last 168 hours.   Radiology    DG Chest Port 1 View  Result Date: 02/22/2020 CLINICAL DATA:  Shortness of breath, cough EXAM: PORTABLE CHEST 1 VIEW COMPARISON:  02/16/2020 FINDINGS: Trace right pleural effusion. Bilateral mild interstitial thickening, right greater than left. No pneumothorax. Stable cardiomediastinal silhouette. No aggressive osseous lesion. IMPRESSION: Small right pleural effusion. Mild right basilar atelectasis. Bilateral interstitial thickening right greater than left which may reflect mild interstitial edema. Electronically Signed   By: Kathreen Devoid   On: 02/22/2020 10:40    Cardiac Studies   TTE (02/17/2020): 1. Left ventricular ejection fraction, by estimation, is 60 to 65%. The  left ventricle has normal function. The left ventricle has no regional  wall motion abnormalities. Left ventricular diastolic parameters are  indeterminate.  2. Right ventricular systolic function is normal. The right ventricular  size is normal.  3. No valve vegetation noted  Patient Profile     74 y.o. female with history of permanent atrial fibrillation, COPD, hypertension pancreatic insufficiency presents with back aches, found to have Enterococcus bacteremia.  Patient being seen to evaluate for possible endocarditis due to her bacteremia.  Assessment & Plan    1.  Bacteremia -On antibiotics as per primary team -We will plan for TEE today  2.  Permanent atrial fibrillation -Currently not on telemetry -Rate seem appropriately controlled, vitals reveal pulse of 87 -Continue diltiazem and apixaban as currently prescribed    Signed, Kate Sable, MD  02/23/2020, 9:31 AM

## 2020-02-23 NOTE — TOC Transition Note (Signed)
Transition of Care [facility]) - CM/SW Discharge Note   Patient Details  Name: Brandy Armstrong MRN: [id number] Date of Birth: 1946-09-12  Transition of Care [facility]) CM/SW Contact:  Candie Chroman, LCSW Phone Number: 02/23/2020, 4:35 PM   Clinical Narrative: Patient has orders to discharge to [facility] today. RN will call report to [phone] (ask for 800 hall nurse. Room 808) prior to setting up EMS transport. No further concerns. CSW signing off.    Final next level of care: [place] Barriers to Discharge: Barriers Resolved   Patient Goals and CMS Choice     Choice offered to / list presented to : Patient, [facility] POA / Guardian  Discharge Placement   Existing PASRR number confirmed : 02/21/20          Patient chooses bed at: [facility] [place] Patient to be transferred to facility by: EMS Name of family member notified: Irma Newness Patient and family notified of of transfer: 02/23/20  Discharge Plan and Services                                     Social Determinants of Health (SDOH) Interventions     Readmission Risk Interventions No flowsheet data found.

## 2020-02-23 NOTE — Progress Notes (Signed)
Peripherally Inserted Central Catheter/Midline Placement  The IV Nurse has discussed with the patient and/or persons authorized to consent for the patient, the purpose of this procedure and the potential benefits and risks involved with this procedure.  The benefits include less needle sticks, lab draws from the catheter, and the patient may be discharged home with the catheter. Risks include, but not limited to, infection, bleeding, blood clot (thrombus formation), and puncture of an artery; nerve damage and irregular heartbeat and possibility to perform a PICC exchange if needed/ordered by physician.  Alternatives to this procedure were also discussed.  Bard Power PICC patient education guide, fact sheet on infection prevention and patient information card has been provided to patient /or left at bedside.    PICC/Midline Placement Documentation  PICC Single Lumen 02/23/20 PICC Right Cephalic 38 cm 0 cm (Active)  Indication for Insertion or Continuance of Line Home intravenous therapies (PICC only) 02/23/20 1725  Exposed Catheter (cm) 0 cm 02/23/20 1725  Site Assessment Clean;Dry;Intact 02/23/20 1725  Line Status Flushed;Saline locked;Blood return noted 02/23/20 1725  Dressing Type Transparent;Securing device 02/23/20 1725  Dressing Status Clean;Dry;Intact;Antimicrobial disc in place 02/23/20 1725  Dressing Intervention New dressing 02/23/20 1725  Dressing Change Due 03/01/20 02/23/20 [place], Dalonda Simoni 02/23/2020, 5:46 PM

## 2020-02-24 ENCOUNTER — Ambulatory Visit: Payer: Medicare Other

## 2020-02-24 ENCOUNTER — Encounter: Payer: Self-pay | Admitting: Cardiovascular Disease

## 2020-02-24 LAB — CULTURE, BLOOD (ROUTINE X 2)
Culture: NO GROWTH
Culture: NO GROWTH
Special Requests: ADEQUATE
Special Requests: ADEQUATE

## 2020-02-26 DIAGNOSIS — B9561 Methicillin susceptible Staphylococcus aureus infection as the cause of diseases classified elsewhere: Secondary | ICD-10-CM | POA: Diagnosis not present

## 2020-02-26 DIAGNOSIS — J449 Chronic obstructive pulmonary disease, unspecified: Secondary | ICD-10-CM | POA: Diagnosis not present

## 2020-02-26 DIAGNOSIS — A419 Sepsis, unspecified organism: Secondary | ICD-10-CM | POA: Diagnosis not present

## 2020-02-26 DIAGNOSIS — I110 Hypertensive heart disease with heart failure: Secondary | ICD-10-CM | POA: Diagnosis not present

## 2020-02-26 DIAGNOSIS — J9600 Acute respiratory failure, unspecified whether with hypoxia or hypercapnia: Secondary | ICD-10-CM | POA: Diagnosis not present

## 2020-03-02 DIAGNOSIS — I110 Hypertensive heart disease with heart failure: Secondary | ICD-10-CM | POA: Diagnosis not present

## 2020-03-02 DIAGNOSIS — J9600 Acute respiratory failure, unspecified whether with hypoxia or hypercapnia: Secondary | ICD-10-CM | POA: Diagnosis not present

## 2020-03-02 DIAGNOSIS — B9561 Methicillin susceptible Staphylococcus aureus infection as the cause of diseases classified elsewhere: Secondary | ICD-10-CM | POA: Diagnosis not present

## 2020-03-02 DIAGNOSIS — A419 Sepsis, unspecified organism: Secondary | ICD-10-CM | POA: Diagnosis not present

## 2020-03-02 DIAGNOSIS — M069 Rheumatoid arthritis, unspecified: Secondary | ICD-10-CM | POA: Diagnosis not present

## 2020-03-09 ENCOUNTER — Emergency Department: Payer: Medicare Other

## 2020-03-09 ENCOUNTER — Inpatient Hospital Stay: Payer: Medicare Other

## 2020-03-09 ENCOUNTER — Inpatient Hospital Stay
Admission: EM | Admit: 2020-03-09 | Discharge: 2020-03-21 | DRG: 193 | Disposition: A | Discharge status: Hospice | Payer: Medicare Other | Source: Skilled Nursing Facility | Attending: Internal Medicine | Admitting: Internal Medicine

## 2020-03-09 ENCOUNTER — Other Ambulatory Visit: Payer: Self-pay

## 2020-03-09 ENCOUNTER — Ambulatory Visit: Payer: Medicare Other | Admitting: Psychiatry

## 2020-03-09 DIAGNOSIS — E871 Hypo-osmolality and hyponatremia: Secondary | ICD-10-CM | POA: Diagnosis not present

## 2020-03-09 DIAGNOSIS — Z7189 Other specified counseling: Secondary | ICD-10-CM | POA: Diagnosis not present

## 2020-03-09 DIAGNOSIS — J90 Pleural effusion, not elsewhere classified: Secondary | ICD-10-CM | POA: Diagnosis not present

## 2020-03-09 DIAGNOSIS — Z452 Encounter for adjustment and management of vascular access device: Secondary | ICD-10-CM | POA: Diagnosis not present

## 2020-03-09 DIAGNOSIS — R627 Adult failure to thrive: Secondary | ICD-10-CM | POA: Diagnosis not present

## 2020-03-09 DIAGNOSIS — J918 Pleural effusion in other conditions classified elsewhere: Secondary | ICD-10-CM | POA: Diagnosis not present

## 2020-03-09 DIAGNOSIS — J440 Chronic obstructive pulmonary disease with acute lower respiratory infection: Secondary | ICD-10-CM | POA: Diagnosis present

## 2020-03-09 DIAGNOSIS — E876 Hypokalemia: Secondary | ICD-10-CM | POA: Diagnosis present

## 2020-03-09 DIAGNOSIS — D509 Iron deficiency anemia, unspecified: Secondary | ICD-10-CM | POA: Diagnosis present

## 2020-03-09 DIAGNOSIS — R1310 Dysphagia, unspecified: Secondary | ICD-10-CM

## 2020-03-09 DIAGNOSIS — J439 Emphysema, unspecified: Secondary | ICD-10-CM | POA: Diagnosis not present

## 2020-03-09 DIAGNOSIS — Z66 Do not resuscitate: Secondary | ICD-10-CM | POA: Diagnosis not present

## 2020-03-09 DIAGNOSIS — Z9889 Other specified postprocedural states: Secondary | ICD-10-CM

## 2020-03-09 DIAGNOSIS — Z7901 Long term (current) use of anticoagulants: Secondary | ICD-10-CM | POA: Diagnosis not present

## 2020-03-09 DIAGNOSIS — Z20822 Contact with and (suspected) exposure to covid-19: Secondary | ICD-10-CM | POA: Diagnosis present

## 2020-03-09 DIAGNOSIS — I5032 Chronic diastolic (congestive) heart failure: Secondary | ICD-10-CM | POA: Diagnosis present

## 2020-03-09 DIAGNOSIS — B9561 Methicillin susceptible Staphylococcus aureus infection as the cause of diseases classified elsewhere: Secondary | ICD-10-CM | POA: Diagnosis present

## 2020-03-09 DIAGNOSIS — G629 Polyneuropathy, unspecified: Secondary | ICD-10-CM | POA: Diagnosis present

## 2020-03-09 DIAGNOSIS — Z87891 Personal history of nicotine dependence: Secondary | ICD-10-CM

## 2020-03-09 DIAGNOSIS — R0603 Acute respiratory distress: Secondary | ICD-10-CM

## 2020-03-09 DIAGNOSIS — M069 Rheumatoid arthritis, unspecified: Secondary | ICD-10-CM | POA: Diagnosis not present

## 2020-03-09 DIAGNOSIS — R0902 Hypoxemia: Secondary | ICD-10-CM

## 2020-03-09 DIAGNOSIS — Z743 Need for continuous supervision: Secondary | ICD-10-CM | POA: Diagnosis not present

## 2020-03-09 DIAGNOSIS — J449 Chronic obstructive pulmonary disease, unspecified: Secondary | ICD-10-CM

## 2020-03-09 DIAGNOSIS — Z833 Family history of diabetes mellitus: Secondary | ICD-10-CM

## 2020-03-09 DIAGNOSIS — R410 Disorientation, unspecified: Secondary | ICD-10-CM | POA: Diagnosis not present

## 2020-03-09 DIAGNOSIS — Z803 Family history of malignant neoplasm of breast: Secondary | ICD-10-CM | POA: Diagnosis not present

## 2020-03-09 DIAGNOSIS — J9601 Acute respiratory failure with hypoxia: Secondary | ICD-10-CM

## 2020-03-09 DIAGNOSIS — R7881 Bacteremia: Secondary | ICD-10-CM

## 2020-03-09 DIAGNOSIS — R069 Unspecified abnormalities of breathing: Secondary | ICD-10-CM | POA: Diagnosis not present

## 2020-03-09 DIAGNOSIS — I4581 Long QT syndrome: Secondary | ICD-10-CM | POA: Diagnosis not present

## 2020-03-09 DIAGNOSIS — Z515 Encounter for palliative care: Secondary | ICD-10-CM | POA: Diagnosis present

## 2020-03-09 DIAGNOSIS — R0602 Shortness of breath: Secondary | ICD-10-CM | POA: Diagnosis not present

## 2020-03-09 DIAGNOSIS — R0689 Other abnormalities of breathing: Secondary | ICD-10-CM | POA: Diagnosis not present

## 2020-03-09 DIAGNOSIS — I4821 Permanent atrial fibrillation: Secondary | ICD-10-CM | POA: Diagnosis not present

## 2020-03-09 DIAGNOSIS — J189 Pneumonia, unspecified organism: Secondary | ICD-10-CM | POA: Diagnosis not present

## 2020-03-09 DIAGNOSIS — Z800 Family history of malignant neoplasm of digestive organs: Secondary | ICD-10-CM

## 2020-03-09 DIAGNOSIS — R918 Other nonspecific abnormal finding of lung field: Secondary | ICD-10-CM | POA: Diagnosis not present

## 2020-03-09 DIAGNOSIS — F419 Anxiety disorder, unspecified: Secondary | ICD-10-CM | POA: Diagnosis present

## 2020-03-09 DIAGNOSIS — F410 Panic disorder [episodic paroxysmal anxiety] without agoraphobia: Secondary | ICD-10-CM

## 2020-03-09 DIAGNOSIS — J441 Chronic obstructive pulmonary disease with (acute) exacerbation: Secondary | ICD-10-CM | POA: Diagnosis not present

## 2020-03-09 DIAGNOSIS — Z6829 Body mass index (BMI) 29.0-29.9, adult: Secondary | ICD-10-CM

## 2020-03-09 DIAGNOSIS — J9811 Atelectasis: Secondary | ICD-10-CM | POA: Diagnosis not present

## 2020-03-09 DIAGNOSIS — I110 Hypertensive heart disease with heart failure: Secondary | ICD-10-CM | POA: Diagnosis not present

## 2020-03-09 LAB — COMPREHENSIVE METABOLIC PANEL
ALT: <5 U/L (ref 0–44)
AST: 15 U/L (ref 15–41)
Albumin: 2.6 g/dL — ABNORMAL LOW (ref 3.5–5.0)
Alkaline Phosphatase: 101 U/L (ref 38–126)
Anion gap: 8 (ref 5–15)
BUN: 24 mg/dL — ABNORMAL HIGH (ref 8–23)
CO2: 29 mmol/L (ref 22–32)
Calcium: 8.9 mg/dL (ref 8.9–10.3)
Chloride: 94 mmol/L — ABNORMAL LOW (ref 98–111)
Creatinine, Ser: 0.59 mg/dL (ref 0.44–1.00)
GFR calc Af Amer: >60 mL/min (ref >60)
GFR calc non Af Amer: >60 mL/min (ref >60)
Glucose, Bld: 137 mg/dL — ABNORMAL HIGH (ref 70–99)
Potassium: 4.5 mmol/L (ref 3.5–5.1)
Sodium: 131 mmol/L — ABNORMAL LOW (ref 135–145)
Total Bilirubin: 0.6 mg/dL (ref 0.3–1.2)
Total Protein: 7.4 g/dL (ref 6.5–8.1)

## 2020-03-09 LAB — CBC WITH DIFFERENTIAL/PLATELET
Abs Immature Granulocytes: 0.1 10*3/uL — ABNORMAL HIGH (ref 0.00–0.07)
Basophils Absolute: 0 10*3/uL (ref 0.0–0.1)
Basophils Relative: 0 %
Eosinophils Absolute: 0 10*3/uL (ref 0.0–0.5)
Eosinophils Relative: 0 %
HCT: 34 % — ABNORMAL LOW (ref 36.0–46.0)
Hemoglobin: 9.4 g/dL — ABNORMAL LOW (ref 12.0–15.0)
Immature Granulocytes: 1 %
Lymphocytes Relative: 11 %
Lymphs Abs: 1 10*3/uL (ref 0.7–4.0)
MCH: 24.2 pg — ABNORMAL LOW (ref 26.0–34.0)
MCHC: 27.6 g/dL — ABNORMAL LOW (ref 30.0–36.0)
MCV: 87.6 fL (ref 80.0–100.0)
Monocytes Absolute: 0.7 10*3/uL (ref 0.1–1.0)
Monocytes Relative: 7 %
Neutro Abs: 7.5 10*3/uL (ref 1.7–7.7)
Neutrophils Relative %: 81 %
Platelets: 376 10*3/uL (ref 150–400)
RBC: 3.88 MIL/uL (ref 3.87–5.11)
RDW: 18.2 % — ABNORMAL HIGH (ref 11.5–15.5)
WBC: 9.3 10*3/uL (ref 4.0–10.5)
nRBC: 0 % (ref 0.0–0.2)

## 2020-03-09 LAB — GLUCOSE, PLEURAL OR PERITONEAL FLUID: Glucose, Fluid: 130 mg/dL

## 2020-03-09 LAB — BODY FLUID CELL COUNT WITH DIFFERENTIAL
Eos, Fluid: 0 %
Lymphs, Fluid: 69 %
Monocyte-Macrophage-Serous Fluid: 19 %
Neutrophil Count, Fluid: 12 %
Total Nucleated Cell Count, Fluid: 1586 cu mm

## 2020-03-09 LAB — RESPIRATORY PANEL BY RT PCR (FLU A&B, COVID)
Influenza A by PCR: NEGATIVE
Influenza B by PCR: NEGATIVE
SARS Coronavirus 2 by RT PCR: NEGATIVE

## 2020-03-09 LAB — TROPONIN I (HIGH SENSITIVITY)
Troponin I (High Sensitivity): 7 ng/L (ref <18)
Troponin I (High Sensitivity): 13 ng/L (ref <18)

## 2020-03-09 LAB — LACTATE DEHYDROGENASE, PLEURAL OR PERITONEAL FLUID: LD, Fluid: 144 U/L — ABNORMAL HIGH (ref 3–23)

## 2020-03-09 LAB — PROTEIN, PLEURAL OR PERITONEAL FLUID: Total protein, fluid: 3.8 g/dL

## 2020-03-09 LAB — BRAIN NATRIURETIC PEPTIDE: B Natriuretic Peptide: 573 pg/mL — ABNORMAL HIGH (ref 0.0–100.0)

## 2020-03-09 MED ORDER — SODIUM CHLORIDE 0.9 % IV SOLN: 1.0000 g | Freq: Once | INTRAVENOUS | Status: DC

## 2020-03-09 MED ORDER — SODIUM CHLORIDE 0.9 % IV SOLN: INTRAVENOUS | Status: DC

## 2020-03-09 MED ORDER — PROMETHAZINE HCL 25 MG RE SUPP
25.0000 mg | Freq: Four times a day (QID) | RECTAL | Status: DC | PRN
  Filled 2020-03-09: qty 1

## 2020-03-09 MED ORDER — HYDROXYCHLOROQUINE SULFATE 200 MG PO TABS: 200.0000 mg | ORAL_TABLET | Freq: Two times a day (BID) | ORAL | Status: DC

## 2020-03-09 MED ORDER — METRONIDAZOLE IN NACL 5-0.79 MG/ML-% IV SOLN
500.0000 mg | Freq: Once | INTRAVENOUS | Status: DC
  Administered 2020-03-09: 500 mg via INTRAVENOUS
  Filled 2020-03-09: qty 100

## 2020-03-09 MED ORDER — PROPRANOLOL HCL ER 120 MG PO CP24
120.0000 mg | ORAL_CAPSULE | Freq: Every day | ORAL | Status: DC
  Administered 2020-03-10: 120 mg via ORAL
  Filled 2020-03-09 (×3): qty 1

## 2020-03-09 MED ORDER — CALCIUM CARBONATE ANTACID 500 MG PO CHEW: 1.0000 | CHEWABLE_TABLET | ORAL | Status: DC | PRN

## 2020-03-09 MED ORDER — IPRATROPIUM-ALBUTEROL 0.5-2.5 (3) MG/3ML IN SOLN: 3.0000 mL | Freq: Once | RESPIRATORY_TRACT | Status: AC

## 2020-03-09 MED ORDER — APIXABAN 5 MG PO TABS
5.0000 mg | ORAL_TABLET | Freq: Two times a day (BID) | ORAL | Status: DC
  Administered 2020-03-10 (×3): 5 mg via ORAL
  Filled 2020-03-09 (×4): qty 1

## 2020-03-09 MED ORDER — GABAPENTIN 600 MG PO TABS
600.0000 mg | ORAL_TABLET | Freq: Three times a day (TID) | ORAL | Status: DC
  Administered 2020-03-10 (×4): 600 mg via ORAL
  Filled 2020-03-09 (×5): qty 1

## 2020-03-09 MED ORDER — PANCRELIPASE (LIP-PROT-AMYL) 12000-38000 UNITS PO CPEP
36000.0000 [IU] | ORAL_CAPSULE | Freq: Three times a day (TID) | ORAL | Status: DC
  Administered 2020-03-10 – 2020-03-20 (×13): 36000 [IU] via ORAL
  Filled 2020-03-09 (×37): qty 3

## 2020-03-09 MED ORDER — IPRATROPIUM-ALBUTEROL 0.5-2.5 (3) MG/3ML IN SOLN
RESPIRATORY_TRACT | Status: AC
  Administered 2020-03-09: 3 mL via RESPIRATORY_TRACT
  Filled 2020-03-09: qty 6

## 2020-03-09 MED ORDER — HYDROCOD POLST-CPM POLST ER 10-8 MG/5ML PO SUER: 5.0000 mL | Freq: Two times a day (BID) | ORAL | Status: DC | PRN

## 2020-03-09 MED ORDER — TRAZODONE HCL 50 MG PO TABS
50.0000 mg | ORAL_TABLET | Freq: Every day | ORAL | Status: DC
  Administered 2020-03-10 – 2020-03-20 (×9): 50 mg via ORAL
  Filled 2020-03-09 (×9): qty 1

## 2020-03-09 MED ORDER — BUSPIRONE HCL 10 MG PO TABS
10.0000 mg | ORAL_TABLET | Freq: Three times a day (TID) | ORAL | Status: DC
  Administered 2020-03-10 – 2020-03-20 (×21): 10 mg via ORAL
  Filled 2020-03-09 (×37): qty 1

## 2020-03-09 MED ORDER — GUAIFENESIN ER 600 MG PO TB12
600.0000 mg | ORAL_TABLET | Freq: Two times a day (BID) | ORAL | Status: DC
  Administered 2020-03-10 – 2020-03-20 (×11): 600 mg via ORAL
  Filled 2020-03-09 (×13): qty 1

## 2020-03-09 MED ORDER — VANCOMYCIN HCL IN DEXTROSE 1-5 GM/200ML-% IV SOLN
1000.0000 mg | Freq: Once | INTRAVENOUS | Status: DC
  Administered 2020-03-09: 1000 mg via INTRAVENOUS
  Filled 2020-03-09: qty 200

## 2020-03-09 MED ORDER — HYOSCYAMINE SULFATE 0.125 MG PO TBDP
0.1250 mg | ORAL_TABLET | Freq: Three times a day (TID) | ORAL | Status: DC
  Administered 2020-03-10 (×3): 0.125 mg via ORAL
  Filled 2020-03-09 (×15): qty 1

## 2020-03-09 MED ORDER — CYCLOBENZAPRINE HCL 10 MG PO TABS: 5.0000 mg | ORAL_TABLET | Freq: Three times a day (TID) | ORAL | Status: DC | PRN

## 2020-03-09 MED ORDER — ALBUTEROL SULFATE (2.5 MG/3ML) 0.083% IN NEBU: 2.5000 mg | INHALATION_SOLUTION | RESPIRATORY_TRACT | Status: DC | PRN

## 2020-03-09 MED ORDER — ALBUTEROL SULFATE (2.5 MG/3ML) 0.083% IN NEBU
2.5000 mg | INHALATION_SOLUTION | Freq: Once | RESPIRATORY_TRACT | Status: AC
  Administered 2020-03-09: 2.5 mg via RESPIRATORY_TRACT
  Filled 2020-03-09: qty 3

## 2020-03-09 MED ORDER — IPRATROPIUM-ALBUTEROL 0.5-2.5 (3) MG/3ML IN SOLN
3.0000 mL | Freq: Once | RESPIRATORY_TRACT | Status: AC
  Administered 2020-03-09: 3 mL via RESPIRATORY_TRACT

## 2020-03-09 MED ORDER — DIGOXIN 0.25 MG/ML IJ SOLN
0.2500 mg | Freq: Once | INTRAMUSCULAR | Status: AC
  Administered 2020-03-10: 0.25 mg via INTRAVENOUS
  Filled 2020-03-09: qty 2

## 2020-03-09 MED ORDER — CEFAZOLIN IV (FOR PTA / DISCHARGE USE ONLY): 2.0000 g | Freq: Three times a day (TID) | INTRAVENOUS | Status: DC

## 2020-03-09 MED ORDER — IPRATROPIUM-ALBUTEROL 0.5-2.5 (3) MG/3ML IN SOLN
3.0000 mL | Freq: Four times a day (QID) | RESPIRATORY_TRACT | Status: DC
  Administered 2020-03-10 – 2020-03-11 (×7): 3 mL via RESPIRATORY_TRACT
  Filled 2020-03-09 (×7): qty 3

## 2020-03-09 MED ORDER — SERTRALINE HCL 50 MG PO TABS
50.0000 mg | ORAL_TABLET | Freq: Every day | ORAL | Status: DC
  Administered 2020-03-10 – 2020-03-20 (×7): 50 mg via ORAL
  Filled 2020-03-09 (×9): qty 1

## 2020-03-09 MED ORDER — LIDOCAINE HCL (PF) 1 % IJ SOLN
30.0000 mL | Freq: Once | INTRAMUSCULAR | Status: DC
  Filled 2020-03-09: qty 30

## 2020-03-09 MED ORDER — HYDROXYZINE HCL 25 MG PO TABS: 50.0000 mg | ORAL_TABLET | Freq: Every day | ORAL | Status: DC | PRN

## 2020-03-09 MED ORDER — PANTOPRAZOLE SODIUM 40 MG PO TBEC
40.0000 mg | DELAYED_RELEASE_TABLET | Freq: Every day | ORAL | Status: DC
  Administered 2020-03-10 – 2020-03-20 (×5): 40 mg via ORAL
  Filled 2020-03-09 (×7): qty 1

## 2020-03-09 MED ORDER — DILTIAZEM HCL ER COATED BEADS 120 MG PO CP24
240.0000 mg | ORAL_CAPSULE | Freq: Every day | ORAL | Status: DC
  Filled 2020-03-09: qty 1

## 2020-03-09 MED ORDER — CEFAZOLIN SODIUM-DEXTROSE 2-4 GM/100ML-% IV SOLN
2.0000 g | Freq: Three times a day (TID) | INTRAVENOUS | Status: DC
  Administered 2020-03-10: 2 g via INTRAVENOUS
  Filled 2020-03-09 (×5): qty 100

## 2020-03-09 MED ORDER — HYDROCHLOROTHIAZIDE 25 MG PO TABS
12.5000 mg | ORAL_TABLET | Freq: Every day | ORAL | Status: DC
  Administered 2020-03-10: 12.5 mg via ORAL
  Filled 2020-03-09 (×3): qty 1

## 2020-03-09 NOTE — ED Notes (Addendum)
Received verbal confirmation from patient that Annett Gula is her power of attorney and permission to provide her regular updates on the patients condition.

## 2020-03-09 NOTE — ED Provider Notes (Signed)
[facility] Emergency Department Provider Note   ____________________________________________   First MD Initiated Contact with Patient 03/09/20 1700     (approximate)  I have reviewed the triage vital signs and the nursing notes.   HISTORY  Chief Complaint Shortness of Breath    HPI Brandy Armstrong is a 74 y.o. female with past medical history of COPD, HFpEF, A. fib on Eliquis, and rheumatoid arthritis presents to the ED for shortness of breath.  History is limited due to patient's respiratory distress.  Patient currently resides [facility] and per EMS was found to be short of breath with an O2 sat of 85% on room air.  She was placed on 8 L by EMS and noted to have minimal air movement, was given DuoNeb with more audible wheezing afterwards.  She was also given 125 mg of Solu-Medrol.  Patient endorses shortness of breath and cough, but denies any fevers or chest pain.  She has not noticed any pain or swelling in her legs.  She was recently admitted for MSSA bacteremia with unclear source, continues to receive antibiotics via right arm PICC line.        Past Medical History:  Diagnosis Date  . A-fib ([place])   . Barretts esophagus   . Chest pain    a. 02/2014 Myoview: Ef 50%, no ischemia.  . Colon polyps   . COPD (chronic obstructive pulmonary disease) ([place])   . Depression with anxiety   . GERD (gastroesophageal reflux disease)   . Hx of benign essential tremor   . Neuropathy   . Permanent atrial fibrillation [facility])    a. s/p ablation 01/2012 in [facility] by Dr. Boyd Kerbs;  b. On sotalol & Xarelto;  c. 02/2014 Echo: EF 50-55%, mild conc LVH, nl LA size/structure;  d. Recurrent afib 8/15 & 08/29/2014.  [name] Rectal fistula   . Rheumatoid arthritis ([place])    On methotrexate and orencia  . Urinary incontinence   . Vitamin D deficiency     Patient Active Problem List   Diagnosis Date Noted  . Pleural effusion on right 03/09/2020  . MSSA bacteremia  02/17/2020  . Pyelonephritis 02/17/2020  . Acute hypoxemic respiratory failure ([place]) 02/17/2020  . Acute encephalopathy 02/17/2020  . Thrombocytopenia ([place]) 02/17/2020  . Sepsis ([place]) 02/16/2020  . Acute lower UTI 02/16/2020  . Toxic metabolic encephalopathy   . Pancreatic insufficiency 01/17/2020  . Pancreatic divisum 01/17/2020  . 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 MDD (major depressive disorder), recurrent episode, moderate ([place]) 08/18/2019  . GAD (generalized anxiety disorder) 08/18/2019  . Panic attacks 08/18/2019  . Cognitive disorder 08/18/2019  . Chest pain 07/09/2019  . Greater trochanteric bursitis of left hip 05/21/2019  . Anesthesia of skin 05/10/2019  . Loose stools 04/07/2019  . Anxiety 04/02/2019  . Appendicitis with perforation 03/09/2019  . Acute appendicitis   . Acute appendicitis with localized peritonitis 02/19/2019  . Chronic diastolic CHF (congestive heart failure) ([place]) 02/10/2019  . Atherosclerosis of aorta ([place]) 02/05/2019  . Bronchitis 12/28/2018  . AAA (abdominal aortic aneurysm) without rupture ([place]) 12/17/2018  . COPD exacerbation ([place]) 12/09/2018  . Iron deficiency anemia due to chronic blood loss 11/03/2018  . Obesity, Class II, BMI 35-39.9 09/23/2018  . Primary osteoarthritis of right knee 09/23/2018  . GERD (gastroesophageal reflux disease) 08/24/2018  . Hives 06/17/2018  . B12 deficiency 06/17/2018  . Anxiety and depression 03/23/2018  . Purpura ([place]) 08/06/2017  . Radicular pain in left  arm 09/30/2016  . Cellulitis 09/26/2016  . Abscess 09/24/2016  . PAD (peripheral artery disease) ([place]) 08/13/2016  . Anemia 08/13/2016  . Urinary urgency 08/06/2016  . Thoracic back pain 05/20/2016  . Neuropathy 10/05/2015  . Tremors of nervous system 06/18/2015  . GI bleed due to NSAIDs 05/04/2015  . Dysuria 03/07/2015  . Atrial fibrillation with RVR ([place]) 03/11/2014  . COPD (chronic obstructive pulmonary disease) ([place])  03/11/2014  . Flexor hallucis longus tendinitis 11/09/2013  . Vertigo 09/16/2013  . Chronic steroid use 09/11/2013  . Osteoarthritis 09/11/2013  . Shortness of breath 08/18/2013  . Generalized weakness 08/18/2013  . Barrett's esophagus 08/18/2013  . Chronic diarrhea 08/18/2013  . Numbness and tingling 08/18/2013  . Screening for breast cancer 08/18/2013  . Rheumatoid arthritis ([place]) 05/31/2013  . Seborrheic keratosis 12/14/2012  . Benign neoplasm of colon 11/23/2012  . Family history of malignant neoplasm of gastrointestinal tract 11/23/2012  . Prediabetes 12/16/2011    Past Surgical History:  Procedure Laterality Date  . ABDOMINAL HYSTERECTOMY  1992  . APPENDECTOMY    . CARDIAC ELECTROPHYSIOLOGY STUDY AND ABLATION  2013  . CHOLECYSTECTOMY  1987  . COLONOSCOPY N/A 05/08/2015   Procedure: COLONOSCOPY;  Surgeon: Manya Silvas, MD;  Location: [facility] ENDOSCOPY;  Service: Endoscopy;  Laterality: N/A;  . COLONOSCOPY WITH PROPOFOL N/A 09/29/2019   Procedure: COLONOSCOPY WITH PROPOFOL;  Surgeon: Toledo, Benay Pike, MD;  Location: [facility] ENDOSCOPY;  Service: Gastroenterology;  Laterality: N/A;  . ESOPHAGOGASTRODUODENOSCOPY N/A 05/06/2015   Procedure: ESOPHAGOGASTRODUODENOSCOPY (EGD);  Surgeon: Lollie Sails, MD;  Location[facility] ENDOSCOPY;  Service: Endoscopy;  Laterality: N/A;  . ESOPHAGOGASTRODUODENOSCOPY (EGD) WITH PROPOFOL N/A 09/29/2019   Procedure: ESOPHAGOGASTRODUODENOSCOPY (EGD) WITH PROPOFOL;  Surgeon: Toledo, Benay Pike, MD;  Location: [facility] ENDOSCOPY;  Service: Gastroenterology;  Laterality: N/A;  . LAPAROSCOPIC APPENDECTOMY N/A 02/19/2019   Procedure: APPENDECTOMY LAPAROSCOPIC converted to open appendectomy;  Surgeon: Herbert Pun, MD;  Location: [facility] ORS;  Service: General;  Laterality: N/A;  . OOPHORECTOMY    . oophrectomy Bilateral 1992  . TEE WITHOUT CARDIOVERSION N/A 02/21/2020   Procedure: TRANSESOPHAGEAL ECHOCARDIOGRAM (TEE);  Surgeon: Wellington Hampshire, MD;  Location:  [facility] ORS;  Service: Cardiovascular;  Laterality: N/A;  . TEE WITHOUT CARDIOVERSION N/A 02/23/2020   Procedure: TRANSESOPHAGEAL ECHOCARDIOGRAM (TEE);  Surgeon: Kate Sable, MD;  Location: [facility] ORS;  Service: Cardiovascular;  Laterality: N/A;    Prior to Admission medications   Medication Sig Start Date End Date Taking? Authorizing Provider  albuterol (VENTOLIN HFA) 108 (90 Base) MCG/ACT inhaler Inhale 2 puffs into the lungs every 4 (four) hours as needed for wheezing or shortness of breath.   Yes [provider]  busPIRone (BUSPAR) 10 MG tablet Take 1 tablet (10 mg total) by mouth 3 (three) times daily. 01/19/20  Yes Ursula Alert, MD  calcium carbonate (TUMS - DOSED IN MG ELEMENTAL CALCIUM) 500 MG chewable tablet Chew 1 tablet by mouth every 4 (four) hours as needed for indigestion or heartburn.   Yes [provider]  ceFAZolin (ANCEF) IVPB Inject 2 g into the vein every 8 (eight) hours. Indication:  MSSA bacteremia Last Day of Therapy:  03/26/2020 Labs - Once weekly:  CBC/D, CMP, CRP Please pull PIC at completion of IV antibiotics 02/23/20 03/26/20 Yes Fritzi Mandes, MD  CREON 36000 units CPEP capsule Takes 2 capsules three times daily. 10/21/19  Yes [provider]  cyclobenzaprine (FLEXERIL) 5 MG tablet Take 1 tablet (5 mg total) by mouth 3 (three) times daily as needed  for muscle spasms. 02/14/20  Yes Arnett, Yvetta Coder, FNP  diltiazem (CARDIZEM CD) 120 MG 24 hr capsule Take 1 capsule (120 mg total) by mouth daily. Patient taking differently: Take 240 mg by mouth daily.  02/23/20  Yes Fritzi Mandes, MD  ELIQUIS 5 MG TABS tablet Take 1 tablet (5 mg total) by mouth 2 (two) times daily. 06/24/19  Yes Minna Merritts, MD  ertapenem [facility]) IVPB Inject 1 g into the vein daily.   Yes [provider]  esomeprazole (NEXIUM) 20 MG capsule TAKE 1 CAPSULE BY MOUTH EVERY DAY 09/29/19  Yes Arnett, Yvetta Coder, FNP  Fluticasone-Umeclidin-Vilant (TRELEGY ELLIPTA)  100-62.5-25 MCG/INH AEPB Inhale 1 puff into the lungs daily. 02/14/20  Yes Tyler Pita, MD  gabapentin (NEURONTIN) 600 MG tablet TAKE 1 TABLET BY MOUTH THREE TIMES A DAY 01/04/20  Yes Arnett, Yvetta Coder, FNP  Heparin Lock Flush (HEPARIN, PORCINE, LOCK FLUSH IV) Inject into the vein See admin instructions. Flush IV port after IV ABX following saline flush every 8 hours   Yes [provider]  HUMIRA PEN 40 MG/0.4ML PNKT Inject 40 mg as directed. Every 2 weeks 07/07/18  Yes [provider]  hydrochlorothiazide (HYDRODIURIL) 25 MG tablet Take 1 tablet (25 mg total) by mouth daily. Patient taking differently: Take 12.5 mg by mouth daily.  01/20/20 04/19/20 Yes Loel Dubonnet, NP  hydroxychloroquine (PLAQUENIL) 200 MG tablet Take 200 mg by mouth 2 (two) times daily.  06/21/16  Yes [provider]  hydrOXYzine (ATARAX/VISTARIL) 50 MG tablet Take 0.5-1 tablets (25-50 mg total) by mouth daily as needed for anxiety. Patient has supplies Patient taking differently: Take 50 mg by mouth daily as needed for anxiety.  10/27/19  Yes Eappen, Ria Clock, MD  hyoscyamine (LEVSIN) 0.125 MG tablet Take 0.125 mg by mouth 3 (three) times daily before meals.    Yes [provider]  ibuprofen (ADVIL) 400 MG tablet Take 400 mg by mouth every 8 (eight) hours as needed.   Yes [provider]  promethazine (PHENERGAN) 25 MG suppository Place 25 mg rectally every 6 (six) hours as needed for nausea or vomiting.   Yes [provider]  propranolol ER (INDERAL LA) 120 MG 24 hr capsule Take 1 capsule (120 mg total) by mouth daily. 05/27/19  Yes Dunn, Ryan M, PA-C  sertraline (ZOLOFT) 50 MG tablet TAKE 1 TABLET BY MOUTH EVERY DAY 01/18/20  Yes Eappen, Ria Clock, MD  Sodium Chloride Flush (NORMAL SALINE FLUSH) 0.9 % SOLN Inject 10 mLs into the vein. Flush IV port before ABX every 8 hours   Yes [provider]  traZODone (DESYREL) 50 MG tablet Take 1 tablet (50 mg total) by mouth  at bedtime. 01/19/20  Yes Ursula Alert, MD    Allergies Latex  Family History  Problem Relation Age of Onset  . Depression Mother   . Pancreatic cancer Mother   . Suicidality Mother   . Colon cancer Father   . Breast cancer Sister 70  . Diabetes Brother   . Breast cancer Cousin   . Neuropathy Neg Hx     Social History Social History   Tobacco Use  . Smoking status: Former Smoker    Packs/day: 1.00    Years: 40.00    Pack years: 40.00    Quit date: 12/16/2011    Years since quitting: 8.2  . Smokeless tobacco: Never Used  Substance Use Topics  . Alcohol use: Not Currently    Alcohol/week: 0.0 standard drinks  Comment: Occasionally has a drink  . Drug use: No    Review of Systems  Constitutional: No fever/chills Eyes: No visual changes. ENT: No sore throat. Cardiovascular: Denies chest pain. Respiratory: Positive for cough and shortness of breath. Gastrointestinal: No abdominal pain.  No nausea, no vomiting.  No diarrhea.  No constipation. Genitourinary: Negative for dysuria. Musculoskeletal: Negative for back pain. Skin: Negative for rash. Neurological: Negative for headaches, focal weakness or numbness.  ____________________________________________   PHYSICAL EXAM:  VITAL SIGNS: ED Triage Vitals  Enc Vitals Group     BP      Pulse      Resp      Temp      Temp src      SpO2      Weight      Height      Head Circumference      [facility]      Pain Score      Pain Loc      Pain Edu?      Excl. in [place]?     Constitutional: Alert and oriented. Eyes: Conjunctivae are normal. Head: Atraumatic. Nose: No congestion/rhinnorhea. Mouth/Throat: Mucous membranes are moist. Neck: Normal ROM Cardiovascular: Normal rate, regular rhythm. Grossly normal heart sounds.  PICC line in place to right upper arm. Respiratory: Tachypneic with accessory muscle use and retractions.  Very poor air movement throughout with faint end expiratory  wheezing. Gastrointestinal: Soft and nontender. No distention. Genitourinary: deferred Musculoskeletal: No lower extremity tenderness nor edema. Neurologic:  Normal speech and language. No gross focal neurologic deficits are appreciated. Skin:  Skin is warm, dry and intact. No rash noted. Psychiatric: Mood and affect are normal. Speech and behavior are normal.  ____________________________________________   LABS (all labs ordered are listed, but only abnormal results are displayed)  Labs Reviewed  CBC WITH DIFFERENTIAL/PLATELET - Abnormal; Notable for the following components:      Result Value   Hemoglobin 9.4 (*)    HCT 34.0 (*)    MCH 24.2 (*)    MCHC 27.6 (*)    RDW 18.2 (*)    Abs Immature Granulocytes 0.10 (*)    All other components within normal limits  BASIC METABOLIC PANEL - Abnormal; Notable for the following components:   Potassium <2.0 (*)    Chloride 124 (*)    CO2 15 (*)    Creatinine, Ser <0.30 (*)    Calcium 4.0 (*)    Anion gap 2 (*)    All other components within normal limits  BRAIN NATRIURETIC PEPTIDE - Abnormal; Notable for the following components:   B Natriuretic Peptide 573.0 (*)    All other components within normal limits  BODY FLUID CELL COUNT WITH DIFFERENTIAL - Abnormal; Notable for the following components:   Color, Fluid RED (*)    Appearance, Fluid CLOUDY (*)    All other components within normal limits  LACTATE DEHYDROGENASE, PLEURAL OR PERITONEAL FLUID - Abnormal; Notable for the following components:   LD, Fluid 144 (*)    All other components within normal limits  COMPREHENSIVE METABOLIC PANEL - Abnormal; Notable for the following components:   Sodium 131 (*)    Chloride 94 (*)    Glucose, Bld 137 (*)    BUN 24 (*)    Albumin 2.6 (*)    All other components within normal limits  RESPIRATORY PANEL BY RT PCR (FLU A&B, COVID)  BODY FLUID CULTURE  CULTURE, BLOOD (ROUTINE X 2)  CULTURE, BLOOD (  ROUTINE X 2)  EXPECTORATED SPUTUM  ASSESSMENT W REFEX TO RESP CULTURE  GLUCOSE, PLEURAL OR PERITONEAL FLUID  PROTEIN, PLEURAL OR PERITONEAL FLUID  PATHOLOGIST SMEAR REVIEW  HIV ANTIBODY (ROUTINE TESTING W REFLEX)  BASIC METABOLIC PANEL  CBC  LEGIONELLA PNEUMOPHILA SEROGP 1 UR AG  STREP PNEUMONIAE URINARY ANTIGEN  TROPONIN I (HIGH SENSITIVITY)  TROPONIN I (HIGH SENSITIVITY)   ____________________________________________  EKG  ED ECG REPORT I, Blake Divine, the attending physician, personally viewed and interpreted this ECG.   Date: 03/09/2020  EKG Time: 17:14  Rate: 108  Rhythm: atrial fibrillation, rate 108  Axis: Normal  Intervals:Prolonged QT  ST&T Change: None   PROCEDURES  Procedure(s) performed (including Critical Care):  .Critical Care Performed by: Blake Divine, MD Authorized by: Blake Divine, MD   Critical care provider statement:    Critical care time (minutes):  45   Critical care time was exclusive of:  Separately billable procedures and treating other patients and teaching time   Critical care was necessary to treat or prevent imminent or life-threatening deterioration of the following conditions:  Respiratory failure   Critical care was time spent personally by me on the following activities:  Discussions with consultants, evaluation of patient's response to treatment, examination of patient, ordering and performing treatments and interventions, ordering and review of laboratory studies, ordering and review of radiographic studies, pulse oximetry, re-evaluation of patient's condition, obtaining history from patient or surrogate and review of old charts   I assumed direction of critical care for this patient from another provider in my specialty: no    THORACENTESIS BEDSIDE  Date/Time: 03/09/2020 9:29 PM Performed by: Blake Divine, MD Authorized by: Blake Divine, MD   Consent:    Consent obtained:  Verbal   Consent given by:  Patient   Risks discussed:  Bleeding, infection,  pain, pneumothorax, nerve damage and incomplete drainage   Alternatives discussed:  Delayed treatment and observation Anesthesia (see MAR for exact dosages):    Anesthesia method:  Local infiltration   Local anesthetic:  Lidocaine 1% w/o epi Procedure details:    Preparation: Patient was prepped and draped in usual sterile fashion     Patient position:  Sitting   Location:  R midscapular line   Intercostal space:  5th   Puncture method:  Over-the-needle catheter   Ultrasound guidance: yes     Indwelling catheter placed: no     Catheter size:  8 Fr   Number of attempts:  1   Drainage characteristics:  Serosanguinous Post-procedure details:    Chest x-ray performed: yes     Chest x-ray findings:  Pleural effusion improved   Patient tolerance of procedure:  Tolerated well, no immediate complications     ____________________________________________   INITIAL IMPRESSION / ASSESSMENT AND PLAN / ED COURSE       74 year old female presents to the ED with increasing shortness of breath recently, found to have O2 sats in the 80s by staff at [facility] and EMS.  She arrives in some respiratory distress with accessory muscle use and tachypnea, very poor air movement throughout with end expiratory wheezing.  We will give 2 additional DuoNeb's and she previously received steroids with EMS.  If her work of breathing does not rapidly improve, we will need to consider starting her on BiPAP.  Symptoms appear related to COPD exacerbation, CHF and fluid overload seems less likely and she has no chest pain to suggest ACS, PE seems less likely.  Checks x-ray shows opacification of  entire right hemithorax with some tracheal deviation and bedside ultrasound shows large pleural effusion.  Given her respiratory distress, bedside thoracentesis was performed with removal of 1 L of serosanguineous fluid.  Her work of breathing was subsequently significantly improved and supplemental oxygen was decreased to 4  L nasal cannula.  Follow-up x-ray showed improvement of effusion with no pneumothorax.  Case was discussed with hospitalist, who accepts patient for admission.  Given her history of recent bacteremia of unclear source, we will treat with broad-spectrum antibiotics for now.      ____________________________________________   FINAL CLINICAL IMPRESSION(S) / ED DIAGNOSES  Final diagnoses:  Pleural effusion  Respiratory distress     ED Discharge Orders    None       Note:  This document was prepared using Dragon voice recognition software and may include unintentional dictation errors.   Blake Divine, MD 03/09/20 [id number]

## 2020-03-09 NOTE — H&P (Addendum)
[place] [facility] [place] NAME: Brandy Armstrong    MR#:  [id number]  DATE OF BIRTH:  [birth year]  DATE OF ADMISSION:  03/09/2020  PRIMARY CARE PHYSICIAN: [name], FNP   REQUESTING/REFERRING PHYSICIAN: Blake Divine, MD CHIEF COMPLAINT:   Chief Complaint  Patient presents with  . Shortness of Breath    HISTORY OF PRESENT ILLNESS:  Brandy Armstrong  is a 74 y.o. Caucasian female with a known history of multiple medical problems that are mentioned below including COPD, atrial fibrillation, on Eliquis, peripheral neuropathy and rheumatoid arthritis, who was recently admitted for MSSA bacteremia of unclear etiology for which she continues to take IV antibiotic therapy through PICC line at [facility] facility where she resides.  She presents to the emergency room with acute onset of worsening dyspnea with hypoxemia to 85% on room air with associated cough occasionally productive of yellowish sputum without fever or chills.  She denied any nausea or vomiting or abdominal pain.  No worsening lower extremity edema..  She is dyspneic in all positions. The patient underwent ultrasound-guided right thoracentesis in the ER with drainage of 1 L of pleural fluid.  Upon presentation to the emergency room, blood pressure was 140/98 and pulse oximetry was up to 90% on 6 L O2 by nasal cannula.  Temperature was 98.1 and heart rate was 103.  Respiratory rate was 20 and later 22 and heart rate later on was up to 112.  Labs revealed anemia with hemoglobin of 9.4 hematocrit 34 comparable to previous levels on 2/21 with otherwise unremarkable CMP.  BNP was 573 compared to 519 on 2/23 and high-sensitivity troponin I was 7.  Chest x-ray initially showed whiteout right lung.  Pleural fluid analysis showed 69 lymphocytes and 12 neutrophils with total protein of 3.8 with LDH of 144. EKG showed atrial fibrillation with slight rapid ventricular response of 108 with Q waves inferiorly  and T wave version laterally as well as prolonged QT interval.  The patient's oxygen requirement currently improved to 3.5 L O2 with pulse ox at 95 to 97% after right thoracentesis.  The patient was given nebulized albuterol as well as duonebs twice, IV cefepime vancomycin and Flagyl.  She will be admitted to a medical monitored bed for further evaluation and management.  PAST MEDICAL HISTORY:   Past Medical History:  Diagnosis Date  . A-fib ([place])   . Barretts esophagus   . Chest pain    a. 02/2014 Myoview: Ef 50%, no ischemia.  . Colon polyps   . COPD (chronic obstructive pulmonary disease) ([place])   . Depression with anxiety   . GERD (gastroesophageal reflux disease)   . Hx of benign essential tremor   . Neuropathy   . Permanent atrial fibrillation [facility])    a. s/p ablation 01/2012 in [facility] by Dr. Boyd Kerbs;  b. On sotalol & Xarelto;  c. 02/2014 Echo: EF 50-55%, mild conc LVH, nl LA size/structure;  d. Recurrent afib 8/15 & 08/29/2014.  [name] Rectal fistula   . Rheumatoid arthritis ([place])    On methotrexate and orencia  . Urinary incontinence   . Vitamin D deficiency     PAST SURGICAL HISTORY:   Past Surgical History:  Procedure Laterality Date  . ABDOMINAL HYSTERECTOMY  1992  . APPENDECTOMY    . CARDIAC ELECTROPHYSIOLOGY STUDY AND ABLATION  2013  . CHOLECYSTECTOMY  1987  . COLONOSCOPY N/A 05/08/2015   Procedure: COLONOSCOPY;  Surgeon: Manya Silvas, MD;  Location: [facility] ENDOSCOPY;  Service: Endoscopy;  Laterality: N/A;  . COLONOSCOPY WITH PROPOFOL N/A 09/29/2019   Procedure: COLONOSCOPY WITH PROPOFOL;  Surgeon: Toledo, Benay Pike, MD;  Location: [facility] ENDOSCOPY;  Service: Gastroenterology;  Laterality: N/A;  . ESOPHAGOGASTRODUODENOSCOPY N/A 05/06/2015   Procedure: ESOPHAGOGASTRODUODENOSCOPY (EGD);  Surgeon: Lollie Sails, MD;  Location: [facility] ENDOSCOPY;  Service: Endoscopy;  Laterality: N/A;  . ESOPHAGOGASTRODUODENOSCOPY (EGD) WITH PROPOFOL N/A 09/29/2019   Procedure:  ESOPHAGOGASTRODUODENOSCOPY (EGD) WITH PROPOFOL;  Surgeon: Toledo, Benay Pike, MD;  Location: [facility] ENDOSCOPY;  Service: Gastroenterology;  Laterality: N/A;  . LAPAROSCOPIC APPENDECTOMY N/A 02/19/2019   Procedure: APPENDECTOMY LAPAROSCOPIC converted to open appendectomy;  Surgeon: Herbert Pun, MD;  Location: [facility] ORS;  Service: General;  Laterality: N/A;  . OOPHORECTOMY    . oophrectomy Bilateral 1992  . TEE WITHOUT CARDIOVERSION N/A 02/21/2020   Procedure: TRANSESOPHAGEAL ECHOCARDIOGRAM (TEE);  Surgeon: [place] Hampshire, MD;  Location: [facility] ORS;  Service: Cardiovascular;  Laterality: N/A;  . TEE WITHOUT CARDIOVERSION N/A 02/23/2020   Procedure: TRANSESOPHAGEAL ECHOCARDIOGRAM (TEE);  Surgeon: Kate Sable, MD;  Location: [facility] ORS;  Service: Cardiovascular;  Laterality: N/A;    SOCIAL HISTORY:   Social History   Tobacco Use  . Smoking status: Former Smoker    Packs/day: 1.00    Years: 40.00    Pack years: 40.00    Quit date: 12/16/2011    Years since quitting: 8.2  . Smokeless tobacco: Never Used  Substance Use Topics  . Alcohol use: Not Currently    Alcohol/week: 0.0 standard drinks    Comment: Occasionally has a drink    FAMILY HISTORY:   Family History  Problem Relation Age of Onset  . Depression Mother   . Pancreatic cancer Mother   . Suicidality Mother   . Colon cancer Father   . Breast cancer Sister 74  . Diabetes Brother   . Breast cancer Cousin   . Neuropathy Neg Hx     DRUG ALLERGIES:   Allergies  Allergen Reactions  . Latex     Itching Itching    REVIEW OF SYSTEMS:   ROS As per history of present illness. All pertinent systems were reviewed above. Constitutional,  HEENT, cardiovascular, respiratory, GI, GU, musculoskeletal, neuro, psychiatric, endocrine,  integumentary and hematologic systems were reviewed and are otherwise  negative/unremarkable except for positive findings mentioned above in the HPI.   MEDICATIONS AT HOME:   Prior  to Admission medications   Medication Sig Start Date End Date Taking? Authorizing Provider  albuterol (VENTOLIN HFA) 108 (90 Base) MCG/ACT inhaler Inhale 2 puffs into the lungs every 4 (four) hours as needed for wheezing or shortness of breath.   Yes [provider]  busPIRone (BUSPAR) 10 MG tablet Take 1 tablet (10 mg total) by mouth 3 (three) times daily. 01/19/20  Yes Ursula Alert, MD  calcium carbonate (TUMS - DOSED IN MG ELEMENTAL CALCIUM) 500 MG chewable tablet Chew 1 tablet by mouth every 4 (four) hours as needed for indigestion or heartburn.   Yes [provider]  ceFAZolin (ANCEF) IVPB Inject 2 g into the vein every 8 (eight) hours. Indication:  MSSA bacteremia Last Day of Therapy:  03/26/2020 Labs - Once weekly:  CBC/D, CMP, CRP Please pull PIC at completion of IV antibiotics 02/23/20 03/26/20 Yes Fritzi Mandes, MD  CREON 36000 units CPEP capsule Takes 2 capsules three times daily. 10/21/19  Yes [provider]  cyclobenzaprine (FLEXERIL) 5 MG tablet Take 1 tablet (5 mg total) by mouth 3 (three)  times daily as needed for muscle spasms. 02/14/20  Yes Arnett, Yvetta Coder, FNP  diltiazem (CARDIZEM CD) 120 MG 24 hr capsule Take 1 capsule (120 mg total) by mouth daily. Patient taking differently: Take 240 mg by mouth daily.  02/23/20  Yes Fritzi Mandes, MD  ELIQUIS 5 MG TABS tablet Take 1 tablet (5 mg total) by mouth 2 (two) times daily. 06/24/19  Yes Minna Merritts, MD  ertapenem [facility]) IVPB Inject 1 g into the vein daily.   Yes [provider]  esomeprazole (NEXIUM) 20 MG capsule TAKE 1 CAPSULE BY MOUTH EVERY DAY 09/29/19  Yes Arnett, Yvetta Coder, FNP  Fluticasone-Umeclidin-Vilant (TRELEGY ELLIPTA) 100-62.5-25 MCG/INH AEPB Inhale 1 puff into the lungs daily. 02/14/20  Yes Tyler Pita, MD  gabapentin (NEURONTIN) 600 MG tablet TAKE 1 TABLET BY MOUTH THREE TIMES A DAY 01/04/20  Yes Arnett, Yvetta Coder, FNP  Heparin Lock Flush (HEPARIN, PORCINE, LOCK FLUSH IV)  Inject into the vein See admin instructions. Flush IV port after IV ABX following saline flush every 8 hours   Yes [provider]  HUMIRA PEN 40 MG/0.4ML PNKT Inject 40 mg as directed. Every 2 weeks 07/07/18  Yes [provider]  hydrochlorothiazide (HYDRODIURIL) 25 MG tablet Take 1 tablet (25 mg total) by mouth daily. Patient taking differently: Take 12.5 mg by mouth daily.  01/20/20 04/19/20 Yes Loel Dubonnet, NP  hydroxychloroquine (PLAQUENIL) 200 MG tablet Take 200 mg by mouth 2 (two) times daily.  06/21/16  Yes [provider]  hydrOXYzine (ATARAX/VISTARIL) 50 MG tablet Take 0.5-1 tablets (25-50 mg total) by mouth daily as needed for anxiety. Patient has supplies Patient taking differently: Take 50 mg by mouth daily as needed for anxiety.  10/27/19  Yes Eappen, Ria Clock, MD  hyoscyamine (LEVSIN) 0.125 MG tablet Take 0.125 mg by mouth 3 (three) times daily before meals.    Yes [provider]  ibuprofen (ADVIL) 400 MG tablet Take 400 mg by mouth every 8 (eight) hours as needed.   Yes [provider]  promethazine (PHENERGAN) 25 MG suppository Place 25 mg rectally every 6 (six) hours as needed for nausea or vomiting.   Yes [provider]  propranolol ER (INDERAL LA) 120 MG 24 hr capsule Take 1 capsule (120 mg total) by mouth daily. 05/27/19  Yes Dunn, Ryan M, PA-C  sertraline (ZOLOFT) 50 MG tablet TAKE 1 TABLET BY MOUTH EVERY DAY 01/18/20  Yes Eappen, Ria Clock, MD  Sodium Chloride Flush (NORMAL SALINE FLUSH) 0.9 % SOLN Inject 10 mLs into the vein. Flush IV port before ABX every 8 hours   Yes [provider]  traZODone (DESYREL) 50 MG tablet Take 1 tablet (50 mg total) by mouth at bedtime. 01/19/20  Yes Ursula Alert, MD      VITAL SIGNS:  Blood pressure (!) 119/91, pulse (!) 108, temperature 98.1 F (36.7 C), temperature source Oral, resp. rate (!) 26, height 5\' 6"  (1.676 m), weight 81.6 kg, SpO2 94 %.  PHYSICAL EXAMINATION:   Physical Exam  GENERAL:  74 y.o.-year-old Caucasian female patient lying in the bed with mild respiratory distress with conversational dyspnea. EYES: Pupils equal, round, reactive to light and accommodation. No scleral icterus. Extraocular muscles intact.  HEENT: Head atraumatic, normocephalic. Oropharynx and nasopharynx clear.  NECK:  Supple, no jugular venous distention. No thyroid enlargement, no tenderness.  LUNGS: Significantly diminished right basal midlung zone upper lung zone breath sounds CARDIOVASCULAR: Regular rate and rhythm, S1, S2 normal. No murmurs, rubs, or  gallops.  ABDOMEN: Soft, nondistended, nontender. Bowel sounds present. No organomegaly or mass.  EXTREMITIES: No pedal edema, cyanosis, or clubbing.  NEUROLOGIC: Cranial nerves II through XII are intact. Muscle strength 5/5 in all extremities. Sensation intact. Gait not checked.  PSYCHIATRIC: The patient is alert and oriented x 3.  Normal affect and good eye contact. SKIN: No obvious rash, lesion, or ulcer.   LABORATORY PANEL:   CBC Recent Labs  Lab 03/09/20 1702  WBC 9.3  HGB 9.4*  HCT 34.0*  PLT 376   ------------------------------------------------------------------------------------------------------------------  Chemistries  Recent Labs  Lab 03/09/20 1817  NA 131*  K 4.5  CL 94*  CO2 29  GLUCOSE 137*  BUN 24*  CREATININE 0.59  CALCIUM 8.9  AST 15  ALT <5  ALKPHOS 101  BILITOT 0.6   ------------------------------------------------------------------------------------------------------------------  Cardiac Enzymes No results for input(s): TROPONINI in the last 168 hours. ------------------------------------------------------------------------------------------------------------------  RADIOLOGY:  No results found.    IMPRESSION AND PLAN:   1.  Massive right pleural effusion with whiteout right lung with subsequent acute hypoxic respiratory failure.. -The patient will be admitted to a  medical monitored bed. -He underwent a diagnostic and therapeutic right thoracentesis with drainage of 1 L of pleural fluid with improvement of her oxygen requirement from 6 L to 3-1/2 L so far. -We will add pleural fluid pH and cytology to thoracentesis labs ordered in the ER. -We will obtain an IR consult in a.m. for further therapeutic thoracentesis. -O2 protocol will be followed. -We will place her on scheduled and as needed duo nebs especially given history of COPD. -We will place her on mucolytic therapy. -She received broad-spectrum IV antibiotics with IV cefepime, vancomycin and Flagyl. -We will defer further IV antibiotics for possible right parapneumonic effusion pending thoracentesis labs results.  2.  Recent MSSA bacteremia. -We will continue IV Ancef that she supposed to take till 03/26/2020. -The patient has finished a course of IV Invanz on 03/08/2020.  3.  COPD. -She does not seem to have any current acute flare. -She will be placed on duo nebs as mentioned above.  4.  Rheumatoid arthritis. -We will hold Plaquenil given prolonged QT interval.  5.  Hypertension. -We will continue HCTZ and Cardizem CD.  6.  Atrial fibrillation with mildly rapid ventricular response, likely secondary to #1. -We will continue Eliquis and Cardizem CD.  We will utilize as needed IV digoxin with borderline blood pressure.  7.  Anxiety. -We will continue BuSpar.  8.  DVT prophylaxis. -We will continue p.o. Eliquis.  All the records are reviewed and case discussed with ED provider. The plan of care was discussed in details with the patient (and family). I answered all questions. The patient agreed to proceed with the above mentioned plan. Further management will depend upon hospital course.   CODE STATUS: Full code  TOTAL TIME TAKING CARE OF THIS PATIENT:  55 minutes.    Christel Mormon M.D on 03/09/2020 at 8:38 PM  [facility]   From 7 PM-7 AM, contact  night-coverage [email or web address]  CC: Primary care physician; [name], FNP   Note: This dictation was prepared with Dragon dictation along with smaller phrase technology. Any transcriptional errors that result from this process are unintentional.

## 2020-03-09 NOTE — ED Triage Notes (Signed)
Pt arrives via EMS from [facility] after they went to check vitals and saw her O2 sat was 85%- pt on 8-10L by EMS- pt had 1 duoneb and 125 of solumedrol- no fever

## 2020-03-09 NOTE — ED Notes (Addendum)
Contacted Brandy Armstrong [facility]) and gave full update on pts status. She is the primary contact and will update the rest of the family

## 2020-03-10 ENCOUNTER — Inpatient Hospital Stay: Payer: Medicare Other

## 2020-03-10 LAB — CBC
HCT: 32.4 % — ABNORMAL LOW (ref 36.0–46.0)
Hemoglobin: 9.2 g/dL — ABNORMAL LOW (ref 12.0–15.0)
MCH: 24.3 pg — ABNORMAL LOW (ref 26.0–34.0)
MCHC: 28.4 g/dL — ABNORMAL LOW (ref 30.0–36.0)
MCV: 85.7 fL (ref 80.0–100.0)
Platelets: 272 10*3/uL (ref 150–400)
RBC: 3.78 MIL/uL — ABNORMAL LOW (ref 3.87–5.11)
RDW: 18.1 % — ABNORMAL HIGH (ref 11.5–15.5)
WBC: 7.8 10*3/uL (ref 4.0–10.5)
nRBC: 0 % (ref 0.0–0.2)

## 2020-03-10 LAB — BASIC METABOLIC PANEL
Anion gap: 10 (ref 5–15)
BUN: 22 mg/dL (ref 8–23)
CO2: 28 mmol/L (ref 22–32)
Calcium: 8.7 mg/dL — ABNORMAL LOW (ref 8.9–10.3)
Chloride: 95 mmol/L — ABNORMAL LOW (ref 98–111)
Creatinine, Ser: 0.51 mg/dL (ref 0.44–1.00)
GFR calc Af Amer: >60 mL/min (ref >60)
GFR calc non Af Amer: >60 mL/min (ref >60)
Glucose, Bld: 127 mg/dL — ABNORMAL HIGH (ref 70–99)
Potassium: 4.3 mmol/L (ref 3.5–5.1)
Sodium: 133 mmol/L — ABNORMAL LOW (ref 135–145)

## 2020-03-10 LAB — LACTATE DEHYDROGENASE: LDH: 159 U/L (ref 98–192)

## 2020-03-10 LAB — PATHOLOGIST SMEAR REVIEW

## 2020-03-10 LAB — MRSA PCR SCREENING: MRSA by PCR: NEGATIVE

## 2020-03-10 LAB — HIV ANTIBODY (ROUTINE TESTING W REFLEX): HIV Screen 4th Generation wRfx: NONREACTIVE

## 2020-03-10 LAB — STREP PNEUMONIAE URINARY ANTIGEN: Strep Pneumo Urinary Antigen: NEGATIVE

## 2020-03-10 MED ORDER — ONDANSETRON HCL 4 MG/2ML IJ SOLN
4.0000 mg | Freq: Once | INTRAMUSCULAR | Status: AC
  Administered 2020-03-10: 4 mg via INTRAVENOUS
  Filled 2020-03-10: qty 2

## 2020-03-10 MED ORDER — CHLORHEXIDINE GLUCONATE CLOTH 2 % EX PADS
6.0000 | MEDICATED_PAD | Freq: Every day | CUTANEOUS | Status: DC
  Administered 2020-03-10 – 2020-03-20 (×11): 6 via TOPICAL

## 2020-03-10 MED ORDER — CEFAZOLIN SODIUM-DEXTROSE 2-4 GM/100ML-% IV SOLN
2.0000 g | Freq: Three times a day (TID) | INTRAVENOUS | Status: DC
  Administered 2020-03-10: 2 g via INTRAVENOUS
  Filled 2020-03-10 (×4): qty 100

## 2020-03-10 MED ORDER — SODIUM CHLORIDE 0.9 % IV SOLN
2.0000 g | Freq: Three times a day (TID) | INTRAVENOUS | Status: DC
  Administered 2020-03-10 – 2020-03-17 (×20): 2 g via INTRAVENOUS
  Filled 2020-03-10 (×23): qty 2

## 2020-03-10 MED ORDER — ENSURE ENLIVE PO LIQD
237.0000 mL | Freq: Two times a day (BID) | ORAL | Status: DC
  Administered 2020-03-12 – 2020-03-15 (×3): 237 mL via ORAL

## 2020-03-10 MED ORDER — PIPERACILLIN-TAZOBACTAM 3.375 G IVPB: 3.3750 g | Freq: Three times a day (TID) | INTRAVENOUS | Status: DC

## 2020-03-10 MED ORDER — DILTIAZEM HCL 60 MG PO TABS
60.0000 mg | ORAL_TABLET | Freq: Four times a day (QID) | ORAL | Status: DC
  Administered 2020-03-10 – 2020-03-12 (×6): 60 mg via ORAL
  Filled 2020-03-10 (×12): qty 1

## 2020-03-10 NOTE — Procedures (Signed)
Pre procedural Dx: Symptomatic pleural effusion Post procedural Dx: Same  Successful US guided right sided thoracentesis yielding 1.2 L of bloody pleural fluid.    EBL: None Complications: Tiny right basilar ex-vacuo hydropneumothorax.   Ronny Bacon, MD Pager #: [phone]

## 2020-03-10 NOTE — TOC Initial Note (Signed)
Transition of Care [facility]) - Initial/Assessment Note    Patient Details  Name: Brandy Armstrong MRN: [id number] Date of Birth: 20-Jul-1946  Transition of Care [facility]) CM/SW Contact:    Beverly Sessions, RN Phone Number: 03/10/2020, 10:55 AM  Clinical Narrative:                 Patient admitted from [facility] for Pleural Effusion  RNCM confirmed with Otila Kluver at [facility] that patient was there for short term rehab.  Per Otila Kluver family did not do a bed hold, but patient can return at discharge  RNCM spoke with HPOA.  She confirms that the plan is for patient to return to [facility] at discharge.  States that her and the other family will be applying for Medicaid, should patient require [place]  MD notified that prior to discharge we would need PT eval, covid test within 4 days of discharge, and insurance approval.  Requested PT eval  Existing PASRR Fl2 sent for signature.    Expected Discharge Plan: [place] Barriers to Discharge: Continued Medical Work up   Patient Goals and CMS Choice        Expected Discharge Plan and Services Expected Discharge Plan: [place] arrangements for the past 2 months: [place]                                      Prior Living Arrangements/Services Living arrangements for the past 2 months: [place] Lives with:: Facility Resident Patient language and need for interpreter reviewed:: No Do you feel safe going back to the place where you live?: Yes      Need for Family Participation in Patient Care: Yes (Comment) Care giver support system in place?: Yes (comment) Current home services: DME Criminal Activity/Legal Involvement Pertinent to Current Situation/Hospitalization: No - Comment as needed  Activities of Daily Living Home Assistive Devices/Equipment: Gilford Rile (specify type) ADL Screening (condition at time of admission) Patient's cognitive ability adequate to  safely complete daily activities?: No Is the patient deaf or have difficulty hearing?: No Does the patient have difficulty seeing, even when wearing glasses/contacts?: No Does the patient have difficulty concentrating, remembering, or making decisions?: Yes Patient able to express need for assistance with ADLs?: Yes Does the patient have difficulty dressing or bathing?: Yes Independently performs ADLs?: No Does the patient have difficulty walking or climbing stairs?: Yes Weakness of Legs: Both Weakness of Arms/Hands: Both  Permission Sought/Granted                  Emotional Assessment       Orientation: : Oriented to Self, Oriented to Place   Psych Involvement: No (comment)  Admission diagnosis:  Respiratory distress [R06.03] Pleural effusion [J90] Pleural effusion on right [J90] Patient Active Problem List   Diagnosis Date Noted  . Pleural effusion on right 03/09/2020  . MSSA bacteremia 02/17/2020  . Pyelonephritis 02/17/2020  . Acute hypoxemic respiratory failure ([place]) 02/17/2020  . Acute encephalopathy 02/17/2020  . Thrombocytopenia ([place]) 02/17/2020  . Sepsis ([place]) 02/16/2020  . Acute lower UTI 02/16/2020  . Toxic metabolic encephalopathy   . Pancreatic insufficiency 01/17/2020  . Pancreatic divisum 01/17/2020  . 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 MDD (major depressive disorder), recurrent episode, moderate ([place]) 08/18/2019  . GAD (generalized anxiety disorder) 08/18/2019  . Panic attacks  08/18/2019  . Cognitive disorder 08/18/2019  . Chest pain 07/09/2019  . Greater trochanteric bursitis of left hip 05/21/2019  . Anesthesia of skin 05/10/2019  . Loose stools 04/07/2019  . Anxiety 04/02/2019  . Appendicitis with perforation 03/09/2019  . Acute appendicitis   . Acute appendicitis with localized peritonitis 02/19/2019  . Chronic diastolic CHF (congestive heart failure) ([place]) 02/10/2019  . Atherosclerosis of aorta ([place]) 02/05/2019  .  Bronchitis 12/28/2018  . AAA (abdominal aortic aneurysm) without rupture ([place]) 12/17/2018  . COPD exacerbation ([place]) 12/09/2018  . Iron deficiency anemia due to chronic blood loss 11/03/2018  . Obesity, Class II, BMI 35-39.9 09/23/2018  . Primary osteoarthritis of right knee 09/23/2018  . GERD (gastroesophageal reflux disease) 08/24/2018  . Hives 06/17/2018  . B12 deficiency 06/17/2018  . Anxiety and depression 03/23/2018  . Purpura ([place]) 08/06/2017  . Radicular pain in left arm 09/30/2016  . Cellulitis 09/26/2016  . Abscess 09/24/2016  . PAD (peripheral artery disease) ([place]) 08/13/2016  . Anemia 08/13/2016  . Urinary urgency 08/06/2016  . Thoracic back pain 05/20/2016  . Neuropathy 10/05/2015  . Tremors of nervous system 06/18/2015  . GI bleed due to NSAIDs 05/04/2015  . Dysuria 03/07/2015  . Atrial fibrillation with RVR ([place]) 03/11/2014  . COPD (chronic obstructive pulmonary disease) ([place]) 03/11/2014  . Flexor hallucis longus tendinitis 11/09/2013  . Vertigo 09/16/2013  . Chronic steroid use 09/11/2013  . Osteoarthritis 09/11/2013  . Shortness of breath 08/18/2013  . Generalized weakness 08/18/2013  . Barrett's esophagus 08/18/2013  . Chronic diarrhea 08/18/2013  . Numbness and tingling 08/18/2013  . Screening for breast cancer 08/18/2013  . Rheumatoid arthritis ([place]) 05/31/2013  . Seborrheic keratosis 12/14/2012  . Benign neoplasm of colon 11/23/2012  . Family history of malignant neoplasm of gastrointestinal tract 11/23/2012  . Prediabetes 12/16/2011   PCP:  [name], FNP Pharmacy:   [id number] - [place], [place] - [address] [place] [address] Phone: [phone] Fax: [phone]     Social Determinants of Health (SDOH) Interventions    Readmission Risk Interventions No flowsheet data found.

## 2020-03-10 NOTE — Consult Note (Signed)
Pharmacy Antibiotic Note  Brandy Armstrong is a 74 y.o. female admitted on 03/09/2020 with HAP.  Pharmacy has been consulted for Zosyn dosing.  Patient is currently on cefazolin for MSSA bacteremia.   Plan: 1. D/w attending about d/c cefazolin   2. Will order Zosyn 3.375g Q8H    Addendum - change to cefepime 2gm IV q8h for anticipated better MSSA coverage vs. Piperacillin/tazobactam   Height: 5\' 6"  (167.6 cm) Weight: 194 lb 3.6 oz (88.1 kg) IBW/kg (Calculated) : 59.3  Temp (24hrs), Avg:98.5 F (36.9 C), Min:98.1 F (36.7 C), Max:99 F (37.2 C)  Recent Labs  Lab 03/09/20 1702 03/09/20 1817 03/10/20 0414  WBC 9.3  --  7.8  CREATININE  --  0.59 0.51    Estimated Creatinine Clearance: 72.1 mL/min (by C-G formula based on SCr of 0.51 mg/dL).    Allergies  Allergen Reactions  . Latex     Itching Itching    Thank you for allowing pharmacy to be a part of this patient's care.  Doreene Eland, PharmD, BCPS.   Work Cell: [phone] 03/10/2020 3:28 PM

## 2020-03-10 NOTE — NC FL2 (Signed)
[place] LEVEL OF CARE SCREENING TOOL     IDENTIFICATION  Patient Name: Brandy Armstrong Birthdate: [birth year] Sex: female Admission Date (Current Location): 03/09/2020  [place] and [place] Number:  Engineering geologist and Address:  [facility], [address] [place], [place] [address]      Provider Number: [id number]  Attending Physician Name and Address:  Elodia Florence., *  Relative Name and Phone Number:  Margaretmary Eddy [phone]    Current Level of Care: Hospital Recommended Level of Care: [place] Prior Approval Number:    Date Approved/Denied:   PASRR Number: [id number]  Discharge Plan: SNF    Current Diagnoses: Patient Active Problem List   Diagnosis Date Noted  . Pleural effusion on right 03/09/2020  . MSSA bacteremia 02/17/2020  . Pyelonephritis 02/17/2020  . Acute hypoxemic respiratory failure ([place]) 02/17/2020  . Acute encephalopathy 02/17/2020  . Thrombocytopenia ([place]) 02/17/2020  . Sepsis ([place]) 02/16/2020  . Acute lower UTI 02/16/2020  . Toxic metabolic encephalopathy   . Pancreatic insufficiency 01/17/2020  . Pancreatic divisum 01/17/2020  . 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 MDD (major depressive disorder), recurrent episode, moderate ([place]) 08/18/2019  . GAD (generalized anxiety disorder) 08/18/2019  . Panic attacks 08/18/2019  . Cognitive disorder 08/18/2019  . Chest pain 07/09/2019  . Greater trochanteric bursitis of left hip 05/21/2019  . Anesthesia of skin 05/10/2019  . Loose stools 04/07/2019  . Anxiety 04/02/2019  . Appendicitis with perforation 03/09/2019  . Acute appendicitis   . Acute appendicitis with localized peritonitis 02/19/2019  . Chronic diastolic CHF (congestive heart failure) ([place]) 02/10/2019  . Atherosclerosis of aorta ([place]) 02/05/2019  . Bronchitis 12/28/2018  . AAA (abdominal aortic aneurysm) without rupture ([place]) 12/17/2018  .  COPD exacerbation ([place]) 12/09/2018  . Iron deficiency anemia due to chronic blood loss 11/03/2018  . Obesity, Class II, BMI 35-39.9 09/23/2018  . Primary osteoarthritis of right knee 09/23/2018  . GERD (gastroesophageal reflux disease) 08/24/2018  . Hives 06/17/2018  . B12 deficiency 06/17/2018  . Anxiety and depression 03/23/2018  . Purpura ([place]) 08/06/2017  . Radicular pain in left arm 09/30/2016  . Cellulitis 09/26/2016  . Abscess 09/24/2016  . PAD (peripheral artery disease) ([place]) 08/13/2016  . Anemia 08/13/2016  . Urinary urgency 08/06/2016  . Thoracic back pain 05/20/2016  . Neuropathy 10/05/2015  . Tremors of nervous system 06/18/2015  . GI bleed due to NSAIDs 05/04/2015  . Dysuria 03/07/2015  . Atrial fibrillation with RVR ([place]) 03/11/2014  . COPD (chronic obstructive pulmonary disease) ([place]) 03/11/2014  . Flexor hallucis longus tendinitis 11/09/2013  . Vertigo 09/16/2013  . Chronic steroid use 09/11/2013  . Osteoarthritis 09/11/2013  . Shortness of breath 08/18/2013  . Generalized weakness 08/18/2013  . Barrett's esophagus 08/18/2013  . Chronic diarrhea 08/18/2013  . Numbness and tingling 08/18/2013  . Screening for breast cancer 08/18/2013  . Rheumatoid arthritis ([place]) 05/31/2013  . Seborrheic keratosis 12/14/2012  . Benign neoplasm of colon 11/23/2012  . Family history of malignant neoplasm of gastrointestinal tract 11/23/2012  . Prediabetes 12/16/2011    Orientation RESPIRATION BLADDER Height & Weight     Self, Time, Situation, Place  Normal External catheter Weight: 88.1 kg Height:  5\' 6"  (167.6 cm)  BEHAVIORAL SYMPTOMS/MOOD NEUROLOGICAL BOWEL NUTRITION STATUS      Continent Diet(Heart Healthy)  AMBULATORY STATUS COMMUNICATION OF NEEDS Skin   Limited Assist Verbally Normal  Personal Care Assistance Level of Assistance  Bathing, Feeding, Dressing Bathing Assistance: Limited assistance Feeding assistance: Limited  assistance Dressing Assistance: Limited assistance     Functional Limitations Info    Sight Info: Adequate Hearing Info: Adequate Speech Info: Adequate    SPECIAL CARE FACTORS FREQUENCY  PT (By licensed PT), OT (By licensed OT)                    Contractures Contractures Info: Not present    Additional Factors Info  Code Status, Allergies Code Status Info: Full Allergies Info: Latex           Current Medications (03/10/2020):  This is the current hospital active medication list Current Facility-Administered Medications  Medication Dose Route Frequency Provider Last Rate Last Admin  . 0.9 %  sodium chloride infusion   Intravenous Continuous Mansy, Arvella Merles, MD 50 mL/hr at 03/09/20 2243 New Bag at 03/09/20 2243  . albuterol (PROVENTIL) (2.5 MG/3ML) 0.083% nebulizer solution 2.5 mg  2.5 mg Inhalation Q4H PRN Mansy, Jan A, MD      . apixaban (ELIQUIS) tablet 5 mg  5 mg Oral BID Mansy, Jan A, MD   5 mg at 03/10/20 0955  . busPIRone (BUSPAR) tablet 10 mg  10 mg Oral TID Mansy, Jan A, MD   10 mg at 03/10/20 0026  . calcium carbonate (TUMS - dosed in mg elemental calcium) chewable tablet 200 mg of elemental calcium  1 tablet Oral Q4H PRN Mansy, Jan A, MD      . ceFAZolin (ANCEF) IVPB 2g/100 mL premix  2 g Intravenous Q8H Mansy, Jan A, MD 200 mL/hr at 03/10/20 1003 2 g at 03/10/20 1003  . Chlorhexidine Gluconate Cloth 2 % PADS 6 each  6 each Topical Daily Elodia Florence., MD      . chlorpheniramine-HYDROcodone (TUSSIONEX) 10-8 MG/5ML suspension 5 mL  5 mL Oral Q12H PRN Mansy, Jan A, MD      . cyclobenzaprine (FLEXERIL) tablet 5 mg  5 mg Oral TID PRN Mansy, Jan A, MD      . diltiazem (CARDIZEM) tablet 60 mg  60 mg Oral Q6H Elodia Florence., MD      . gabapentin (NEURONTIN) tablet 600 mg  600 mg Oral TID Mansy, Jan A, MD   600 mg at 03/10/20 0954  . guaiFENesin (MUCINEX) 12 hr tablet 600 mg  600 mg Oral BID Mansy, Jan A, MD   600 mg at 03/10/20 0954  .  hydrochlorothiazide (HYDRODIURIL) tablet 12.5 mg  12.5 mg Oral Daily Mansy, Jan A, MD   12.5 mg at 03/10/20 0953  . hydrOXYzine (ATARAX/VISTARIL) tablet 50 mg  50 mg Oral Daily PRN Mansy, Jan A, MD      . hyoscyamine (ANASPAZ) disintergrating tablet 0.125 mg  0.125 mg Oral TID [facility] Mansy, Jan A, MD   0.125 mg at 03/10/20 0955  . ipratropium-albuterol (DUONEB) 0.5-2.5 (3) MG/3ML nebulizer solution 3 mL  3 mL Nebulization QID Mansy, Jan A, MD   3 mL at 03/10/20 0701  . lidocaine (PF) (XYLOCAINE) 1 % injection 30 mL  30 mL Infiltration Once Blake Divine, MD      . lipase/protease/amylase (CREON) capsule 36,000 Units  36,000 Units Oral TID WC Mansy, Arvella Merles, MD   36,000 Units at 03/10/20 0955  . pantoprazole (PROTONIX) EC tablet 40 mg  40 mg Oral Daily Mansy, Jan A, MD   40 mg at 03/10/20 0952  . promethazine (PHENERGAN) suppository 25  mg  25 mg Rectal Q6H PRN Mansy, Jan A, MD      . propranolol ER (INDERAL LA) 24 hr capsule 120 mg  120 mg Oral Daily Mansy, Jan A, MD   120 mg at 03/10/20 0955  . sertraline (ZOLOFT) tablet 50 mg  50 mg Oral Daily Mansy, Jan A, MD   50 mg at 03/10/20 0953  . traZODone (DESYREL) tablet 50 mg  50 mg Oral QHS Mansy, Jan A, MD   50 mg at 03/10/20 0025     Discharge Medications: Please see discharge summary for a list of discharge medications.  Relevant Imaging Results:  Relevant Lab Results:   Additional Information SS# [id number]  Shelbie Ammons, RN

## 2020-03-10 NOTE — Plan of Care (Signed)
Continuing with plan of care. 

## 2020-03-10 NOTE — Progress Notes (Signed)
Initial Nutrition Assessment  DOCUMENTATION CODES:   Obesity unspecified  INTERVENTION:  Provide Ensure Enlive po BID, each supplement provides 350 kcal and 20 grams of protein.  NUTRITION DIAGNOSIS:   Inadequate oral intake related to decreased appetite as evidenced by per patient/family report, meal completion < 50%.  GOAL:   Patient will meet greater than or equal to 90% of their needs  MONITOR:   PO intake, Supplement acceptance, Labs, Weight trends, I & O's  REASON FOR ASSESSMENT:   Malnutrition Screening Tool    ASSESSMENT:   74 year old female with PMHx of GERD, RA, depression, anxiety, Barretts esophagus, vitamin D deficiency, COPD, neuropathy, A-fib admitted with acute hypoxic respiratory failure, right pleural effusion, recent MSSA bacteremia.   -Patient s/p US guided right thoracentesis that yielded 1.2 L bloody pleural fluid.  Met with patient at bedside. She was somewhat confused and not able to answer any questions regarding nutrition/weight history. She does report she is not hungry. Her lunch was still sitting at bedside untouched at time of RD assessment around 3:00pm. Patient was unsure if she had any breakfast today or how much she ate.  According to chart patient was 93 kg on 12/17/2019. She is now 88.1 kg (194.23 lbs). She has lost 4.9 kg (5.3% body weight) over the past 3 months, which is not significant for time frame.  Medications reviewed and include: Eliquis, gabapentin, Creon 36000 units TID, pantoprazole, sertraline, cefepime.  Labs reviewed: Sodium 133, Chloride 95.  Patient does not meet criteria for malnutrition at this time.  NUTRITION - FOCUSED PHYSICAL EXAM:    Most Recent Value  Orbital Region  No depletion  Upper Arm Region  Mild depletion  Thoracic and Lumbar Region  No depletion  Buccal Region  No depletion  Temple Region  Mild depletion  Clavicle Bone Region  No depletion  Clavicle and Acromion Bone Region  No depletion   Scapular Bone Region  No depletion  Dorsal Hand  Mild depletion  Patellar Region  Mild depletion  Anterior Thigh Region  Mild depletion  Posterior Calf Region  Moderate depletion  Edema (RD Assessment)  None  Hair  Reviewed  Eyes  Reviewed  Mouth  Reviewed  Skin  Reviewed  Nails  Reviewed     Diet Order:   Diet Order            Diet Heart Room service appropriate? Yes; Fluid consistency: Thin  Diet effective now             EDUCATION NEEDS:   No education needs have been identified at this time  Skin:  Skin Assessment: Reviewed RN Assessment(ecchymosis)  Last BM:  Unknown  Height:   Ht Readings from Last 1 Encounters:  03/09/20 '5\' 6"'$  (1.676 m)   Weight:   Wt Readings from Last 1 Encounters:  03/09/20 88.1 kg   Ideal Body Weight:  59.1 kg  BMI:  Body mass index is 31.35 kg/m.  Estimated Nutritional Needs:   Kcal:  1700-2000  Protein:  90-100 grams  Fluid:  1.8-2 L/day  Jacklynn Barnacle, MS, RD, LDN Pager number available on Amion

## 2020-03-10 NOTE — Consult Note (Signed)
Pharmacy Antibiotic Note  RHONNA DRUGAN is a 74 y.o. female admitted on 03/09/2020 with HAP.  Pharmacy has been consulted for Zosyn dosing.  Patient is currently on cefazolin for MSSA bacteremia.   Plan: 1. D/w attending about d/c cefazolin   2. Will order Zosyn 3.375g Q8H   Height: 5\' 6"  (167.6 cm) Weight: 194 lb 3.6 oz (88.1 kg) IBW/kg (Calculated) : 59.3  Temp (24hrs), Avg:98.5 F (36.9 C), Min:98.1 F (36.7 C), Max:99 F (37.2 C)  Recent Labs  Lab 03/09/20 1702 03/09/20 1817 03/10/20 0414  WBC 9.3  --  7.8  CREATININE  --  0.59 0.51    Estimated Creatinine Clearance: 72.1 mL/min (by C-G formula based on SCr of 0.51 mg/dL).    Allergies  Allergen Reactions  . Latex     Itching Itching    Thank you for allowing pharmacy to be a part of this patient's care.  Rowland Lathe 03/10/2020 3:17 PM

## 2020-03-10 NOTE — Progress Notes (Addendum)
PROGRESS NOTE    [name]  GUR:[id number] DOB: 06-14-1946 DOA: 03/09/2020 PCP: [name], FNP   Brief Narrative:  Brandy Armstrong  is Brandy Armstrong 74 y.o. Caucasian female with Brandy Armstrong known history of multiple medical problems that are mentioned below including COPD, atrial fibrillation, on Eliquis, peripheral neuropathy and rheumatoid arthritis, who was recently admitted for MSSA bacteremia of unclear etiology for which she continues to take IV antibiotic therapy through PICC line at [facility] where she resides.  She presents to the emergency room with acute onset of worsening dyspnea with hypoxemia to 85% on room air with associated cough occasionally productive of yellowish sputum without fever or chills.  She denied any nausea or vomiting or abdominal pain.  No worsening lower extremity edema..  She is dyspneic in all positions. The patient underwent ultrasound-guided right thoracentesis in the ER with drainage of 1 L of pleural fluid.  Upon presentation to the emergency room, blood pressure was 140/98 and pulse oximetry was up to 90% on 6 L O2 by nasal cannula.  Temperature was 98.1 and heart rate was 103.  Respiratory rate was 20 and later 22 and heart rate later on was up to 112.  Labs revealed anemia with hemoglobin of 9.4 hematocrit 34 comparable to previous levels on 2/21 with otherwise unremarkable CMP.  BNP was 573 compared to 519 on 2/23 and high-sensitivity troponin I was 7.  Chest x-ray initially showed whiteout right lung.  Pleural fluid analysis showed 69 lymphocytes and 12 neutrophils with total protein of 3.8 with LDH of 144. EKG showed atrial fibrillation with slight rapid ventricular response of 108 with Q waves inferiorly and T wave version laterally as well as prolonged QT interval.  The patient's oxygen requirement currently improved to 3.5 L O2 with pulse ox at 95 to 97% after right thoracentesis.  The patient was given nebulized albuterol as well as  duonebs twice, IV cefepime vancomycin and Flagyl.  She [facility] be [facility] for further evaluation and management.  Assessment & Plan:   Active Problems:   Pleural effusion on right  Exudative Right Pleural Effusion  Acute Hypoxic Respiratory Failure:  Currently requiring 3 L [place] S/p thoracentesis in ED with 1 L serosanguinous fluid removed on 3/11 3/12 with thoracentesis by IR with 1.2 L bloody pleural fluid -> c/b R basilar ex vacuo hydropneumothorax Exudative by lights Follow cytology and culture Cefepime Blood cx NGTD Body fluid cx with NG Repeat CXR with interval reduction of R sided pleural effusion -> tiny R basilar ex vacuo hydropneumothorax.  Improved aeration of the R mid and lower lung with persistent R medial basilar heterogeneous/consolidative opacities (atelectasis vs infiltrate)  2.  Recent MSSA bacteremia. -We will continue IV Ancef that she supposed to take till 03/26/2020. (switch to cefepime for now to cover for pneumonia) -The patient has finished Akaila Rambo course of IV Invanz on 03/08/2020 (will need to review this, I don't see this clearly?)  3.  COPD. -She does not seem to have any current acute flare. -She will be placed on duo nebs as mentioned above.  4.  Rheumatoid arthritis. -We will hold Plaquenil given prolonged QT interval. - Repeat EKG  5.  Hypertension. -We will continue HCTZ and Cardizem CD.  6.  Atrial fibrillation with mildly rapid ventricular response, likely secondary to #1. -We will continue Eliquis and Cardizem CD.  Adjust dose as needed.  7.  Anxiety. -We will continue BuSpar.  8.  DVT prophylaxis. -We will continue p.o. Eliquis.  DVT prophylaxis: eliquis Code Status: full  Family Communication: none at bedside - called martha, no answer Disposition Plan:  . Patient came from:             . Anticipated d/c place:  . Barriers to d/c OR conditions which need to be met to effect Lecil Tapp safe d/c:   Consultants:    IR  Procedures:   Thoracentesis 3/11 and 3/12  Antimicrobials:  Anti-infectives (From admission, onward)   Start     Dose/Rate Route Frequency Ordered Stop   03/10/20 1600  piperacillin-tazobactam (ZOSYN) IVPB 3.375 g     3.375 g 12.5 mL/hr over 240 Minutes Intravenous Every 8 hours 03/10/20 1519     03/10/20 1000  ceFAZolin (ANCEF) IVPB 2g/100 mL premix  Status:  Discontinued     2 g 200 mL/hr over 30 Minutes Intravenous Every 8 hours 03/10/20 0624 03/10/20 1519   03/09/20 2200  ceFAZolin (ANCEF) IVPB  Status:  Discontinued    Note to Pharmacy: Indication:  MSSA bacteremia Last Day of Therapy:  03/26/2020 Labs - Once weekly:  CBC/D, CMP, CRP Please pull PIC at completion of IV antibiotics     2 g Intravenous Every 8 hours 03/09/20 2037 03/09/20 2144   03/09/20 2200  hydroxychloroquine (PLAQUENIL) tablet 200 mg  Status:  Discontinued     200 mg Oral 2 times daily 03/09/20 2037 03/10/20 0015   03/09/20 2200  ceFAZolin (ANCEF) IVPB 2g/100 mL premix  Status:  Discontinued     2 g 200 mL/hr over 30 Minutes Intravenous Every 8 hours 03/09/20 2144 03/10/20 0624   03/09/20 2015  ceFEPIme (MAXIPIME) 1 g in sodium chloride 0.9 % 100 mL IVPB  Status:  Discontinued     1 g 200 mL/hr over 30 Minutes Intravenous Once 03/09/20 2008 03/09/20 2144   03/09/20 2015  vancomycin (VANCOCIN) IVPB 1000 mg/200 mL premix  Status:  Discontinued     1,000 mg 200 mL/hr over 60 Minutes Intravenous  Once 03/09/20 2008 03/10/20 0117   03/09/20 2015  metroNIDAZOLE (FLAGYL) IVPB 500 mg  Status:  Discontinued     500 mg 100 mL/hr over 60 Minutes Intravenous  Once 03/09/20 2008 03/10/20 0117     Subjective: Seems like she can't catch her breath  Objective: Vitals:   03/10/20 1135 03/10/20 1321 03/10/20 1356 03/10/20 1434  BP: (!) 144/80 132/86 125/90   Pulse: (!) 108     Resp: 16     Temp: 99 F (37.2 C)     TempSrc: Oral     SpO2: 98% 97% 91% 94%  Weight:      Height:       No intake or output  data in the 24 hours ending 03/10/20 1509 Filed Weights   03/09/20 1710 03/09/20 2351  Weight: 81.6 kg 88.1 kg    Examination:  General exam: Appears calm and comfortable  Respiratory system: decreased breath sounds on R Cardiovascular system: tachy, irreg irreg Gastrointestinal system: Abdomen is nondistended, soft and nontender.  Central nervous system: Alert and oriented. No focal neurological deficits. Extremities: no LEE Skin: No rashes, lesions or ulcers Psychiatry: Judgement and insight appear normal. Mood & affect appropriate.     Data Reviewed: I have personally reviewed following labs and imaging studies  CBC: Recent Labs  Lab 03/09/20 1702 03/10/20 0414  WBC 9.3 7.8  NEUTROABS 7.5  --   HGB 9.4* 9.2*  HCT 34.0* 32.4*  MCV  87.6 85.7  PLT 376 532   Basic Metabolic Panel: Recent Labs  Lab 03/09/20 1817 03/10/20 0414  NA 131* 133*  K 4.5 4.3  CL 94* 95*  CO2 29 28  GLUCOSE 137* 127*  BUN 24* 22  CREATININE 0.59 0.51  CALCIUM 8.9 8.7*   GFR: Estimated Creatinine Clearance: 72.1 mL/min (by C-G formula based on SCr of 0.51 mg/dL). Liver Function Tests: Recent Labs  Lab 03/09/20 1817  AST 15  ALT <5  ALKPHOS 101  BILITOT 0.6  PROT 7.4  ALBUMIN 2.6*   No results for input(s): LIPASE, AMYLASE in the last 168 hours. No results for input(s): AMMONIA in the last 168 hours. Coagulation Profile: No results for input(s): INR, PROTIME in the last 168 hours. Cardiac Enzymes: No results for input(s): CKTOTAL, CKMB, CKMBINDEX, TROPONINI in the last 168 hours. BNP (last 3 results) No results for input(s): PROBNP in the last 8760 hours. HbA1C: No results for input(s): HGBA1C in the last 72 hours. CBG: No results for input(s): GLUCAP in the last 168 hours. Lipid Profile: No results for input(s): CHOL, HDL, LDLCALC, TRIG, CHOLHDL, LDLDIRECT in the last 72 hours. Thyroid Function Tests: No results for input(s): TSH, T4TOTAL, FREET4, T3FREE, THYROIDAB in  the last 72 hours. Anemia Panel: No results for input(s): VITAMINB12, FOLATE, FERRITIN, TIBC, IRON, RETICCTPCT in the last 72 hours. Sepsis Labs: No results for input(s): PROCALCITON, LATICACIDVEN in the last 168 hours.  Recent Results (from the past 240 hour(s))  Body fluid culture (includes gram stain)     Status: None (Preliminary result)   Collection Time: 03/09/20  6:52 PM   Specimen: Pleural Fluid  Result Value Ref Range Status   Specimen Description   Final    PLEURAL Performed at [facility], [address], [place], [place] [address]    Special Requests   Final    PLEURAL Performed at [facility], [address] [place], [place] [address]    Gram Stain   Final    FEW WBC PRESENT, PREDOMINANTLY MONONUCLEAR NO ORGANISMS SEEN Performed at [facility], [place] [address] [place], [place] [address]    Culture NO GROWTH < 12 HOURS  Final   Report Status PENDING  Incomplete  Respiratory Panel by RT PCR (Flu Traniya Prichett&B, Covid) - Nasopharyngeal Swab     Status: None   Collection Time: 03/09/20  8:37 PM   Specimen: Nasopharyngeal Swab  Result Value Ref Range Status   SARS Coronavirus 2 by RT PCR NEGATIVE NEGATIVE Final    Comment: (NOTE) SARS-CoV-2 target nucleic acids are NOT DETECTED. The SARS-CoV-2 RNA is generally detectable in upper respiratoy specimens during the acute phase of infection. The lowest concentration of SARS-CoV-2 viral copies this assay can detect is 131 copies/mL. Elbert Polyakov negative result does not preclude SARS-Cov-2 infection and should not be used as the sole basis for treatment or other patient management decisions. Arias Weinert negative result may occur with  improper specimen collection/handling, submission of specimen other than nasopharyngeal swab, presence of viral mutation(s) within the areas targeted by this assay, and inadequate number of viral copies (<131 copies/mL). Magdaleno Lortie negative result must be combined with clinical observations, patient  history, and epidemiological information. The expected result is Negative. Fact Sheet for Patients:  PinkCheek.be Fact Sheet for Healthcare Providers:  GravelBags.it This test is not yet ap proved or cleared by the [place] FDA and  has been authorized for detection and/or diagnosis of SARS-CoV-2 by FDA under an Emergency Use  Authorization (EUA). This EUA will remain  in effect (meaning this test can be used) for the duration of the COVID-19 declaration under Section 564(b)(1) of the Act, 21 U.S.C. section 360bbb-3(b)(1), unless the authorization is terminated or revoked sooner.    Influenza Brianni Manthe by PCR NEGATIVE NEGATIVE Final   Influenza B by PCR NEGATIVE NEGATIVE Final    Comment: (NOTE) The Xpert Xpress SARS-CoV-2/FLU/RSV assay is intended as an aid in  the diagnosis of influenza from Nasopharyngeal swab specimens and  should not be used as Elza Sortor sole basis for treatment. Nasal washings and  aspirates are unacceptable for Xpert Xpress SARS-CoV-2/FLU/RSV  testing. Fact Sheet for Patients: PinkCheek.be Fact Sheet for Healthcare Providers: GravelBags.it This test is not yet approved or cleared by the [place] FDA and  has been authorized for detection and/or diagnosis of SARS-CoV-2 by  FDA under an Emergency Use Authorization (EUA). This EUA will remain  in effect (meaning this test can be used) for the duration of the  Covid-19 declaration under Section 564(b)(1) of the Act, 21  U.S.C. section 360bbb-3(b)(1), unless the authorization is  terminated or revoked. Performed at [facility], [address] [place], [place] [address]   Culture, blood (routine x 2)     Status: None (Preliminary result)   Collection Time: 03/09/20  9:31 PM   Specimen: BLOOD  Result Value Ref Range Status   Specimen Description BLOOD LEFT ASSIST CONTROL  Final   Special  Requests   Final    BOTTLES DRAWN AEROBIC AND ANAEROBIC Blood Culture adequate volume   Culture   Final    NO GROWTH < 12 HOURS Performed at [facility], [address], [place], [place] [address]    Report Status PENDING  Incomplete  Culture, blood (routine x 2)     Status: None (Preliminary result)   Collection Time: 03/09/20  9:45 PM   Specimen: BLOOD  Result Value Ref Range Status   Specimen Description BLOOD RIGHT ASSIST CONTROL  Final   Special Requests   Final    BOTTLES DRAWN AEROBIC AND ANAEROBIC Blood Culture adequate volume   Culture   Final    NO GROWTH < 12 HOURS Performed at [facility], [address], [place], [place] [address]    Report Status PENDING  Incomplete         Radiology Studies: CT Chest Wo Contrast  Result Date: 03/09/2020 CLINICAL DATA:  Pleural effusion EXAM: CT CHEST WITHOUT CONTRAST TECHNIQUE: Multidetector CT imaging of the chest was performed following the standard protocol without IV contrast. COMPARISON:  Chest x-ray 03/09/2020, CT 03/11/2014 FINDINGS: Cardiovascular: Limited evaluation without intravenous contrast. Moderate aortic atherosclerosis without aneurysm. Coronary vascular calcification. Mild cardiomegaly. No significant pericardial effusion. Mediastinum/Nodes: Midline trachea. No thyroid mass. Esophagus within normal limits. Borderline enlarged low right paratracheal lymph node measuring 1 cm, series 2, image number 50. Lungs/Pleura: Moderate to large residual right pleural effusion. Emphysema. Consolidation within the right middle lobe and right lower lobe with air bronchograms. The left lung shows no focal airspace disease. Upper Abdomen: No acute abnormality. Musculoskeletal: Degenerative changes. No acute or suspicious osseous abnormality. IMPRESSION: 1. Moderate to large residual right pleural effusion. Consolidation within the right middle lobe and right lower lobe which may be secondary to atelectasis or  pneumonia. Underlying lung mass could be obscured and imaging follow-up to clearing is recommended. 2. Cardiomegaly Aortic Atherosclerosis (ICD10-I70.0) and Emphysema (ICD10-J43.9). Electronically Signed   By: Donavan Foil M.D.   On: 03/09/2020  22:50   DG Chest Port 1 View  Result Date: 03/10/2020 CLINICAL DATA:  Post right-sided thoracentesis EXAM: PORTABLE CHEST 1 VIEW COMPARISON:  03/09/2020; chest CT-03/09/2020 FINDINGS: Grossly unchanged enlarged cardiac silhouette and mediastinal contours with persistent partial obscuration the right heart border secondary to right medial basilar heterogeneous/consolidative opacities. Interval reduction/near resolution of right-sided pleural effusion post thoracentesis with improved aeration of the right mid and lower lung. Interval development of Ellizabeth Dacruz tiny loculated right basilar ex vacuo hydropneumothorax. No mediastinal shift. The left hemithorax remains well aerated. No new focal airspace opacities. Mild pulmonary congestion without frank evidence of edema. Stable position of support apparatus. No acute osseous abnormalities. Post cholecystectomy. IMPRESSION: 1. Interval reduction/near resolution of right-sided pleural effusion post thoracentesis with development of tiny right basilar ex vacuo hydropneumothorax. 2. Improved aeration of the right mid and lower lung with persistent right medial basilar heterogeneous/consolidative opacities, atelectasis versus infiltrate (favored). Electronically Signed   By: Sandi Mariscal M.D.   On: 03/10/2020 14:12   DG Chest Portable 1 View  Result Date: 03/09/2020 CLINICAL DATA:  Status post thoracentesis. EXAM: PORTABLE CHEST 1 VIEW COMPARISON:  Radiograph earlier this day. FINDINGS: Large right pleural effusion with slight decrease in size from earlier today. Small portion of aerated lung at the right lung apex is no seen. Right upper extremity PICC remains in place. No left pleural effusion. Mild left peribronchial thickening. No  visualized pneumothorax. IMPRESSION: 1. Decreased size of large right pleural effusion after thoracentesis today. Large volume of pleural fluid persists. No pneumothorax. 2. Right upper extremity PICC remains in place. Electronically Signed   By: Keith Rake M.D.   On: 03/09/2020 19:23   DG Chest Portable 1 View  Result Date: 03/09/2020 CLINICAL DATA:  Shortness of breath EXAM: PORTABLE CHEST 1 VIEW COMPARISON:  02/23/2020 FINDINGS: Right upper extremity catheter tip terminates at expected location of SVC. Interval complete opacification of right thorax. Left lung grossly clear. Obscured cardiomediastinal silhouette. IMPRESSION: Interval complete opacification of the right thorax which may be secondary to large pleural effusion or diffuse airspace disease, or combination of the 2. Central obstructing process is also possible. Electronically Signed   By: Donavan Foil M.D.   On: 03/09/2020 17:43   US THORACENTESIS ASP PLEURAL SPACE W/IMG GUIDE  Result Date: 03/10/2020 INDICATION: Symptomatic right-sided pleural effusion. Please from ultrasound-guided thoracentesis for diagnostic purposes. EXAM: US THORACENTESIS ASP PLEURAL SPACE W/IMG GUIDE COMPARISON:  Chest CT - 03/09/2020; chest radiograph - 03/10/2019 MEDICATIONS: None. COMPLICATIONS: SIR Level Gelene Recktenwald - No therapy, no consequence. Procedure complicated by development of Jiles Goya tiny right basilar ex vacuo hydropneumothorax. TECHNIQUE: Informed written consent was obtained from the patient after Dustin Bumbaugh discussion of the risks, benefits and alternatives to treatment. Jem Castro timeout was performed prior to the initiation of the procedure. Initial ultrasound scanning demonstrates Limmie Schoenberg moderate to large sized residual anechoic right-sided pleural effusion. The skin overlying the inferolateral lower chest was prepped and draped in the usual sterile fashion. 1% lidocaine was used for local anesthesia. An ultrasound image was saved for documentation purposes. An 8 Fr Safe-T-Centesis  catheter was introduced. The thoracentesis was performed. The catheter was removed and Migel Hannis dressing was applied. The patient tolerated the procedure well without immediate post procedural complication. The patient was escorted to have an upright chest radiograph. FINDINGS: Bernis Stecher total of approximately 1.2 liters of bloody pleural fluid was removed. Requested samples were sent to the laboratory. IMPRESSION: Successful ultrasound-guided right sided thoracentesis yielding 1.2 liters of bloody pleural fluid. Electronically Signed  By: Sandi Mariscal M.D.   On: 03/10/2020 14:23        Scheduled Meds: . apixaban  5 mg Oral BID  . busPIRone  10 mg Oral TID  . Chlorhexidine Gluconate Cloth  6 each Topical Daily  . diltiazem  60 mg Oral Q6H  . gabapentin  600 mg Oral TID  . guaiFENesin  600 mg Oral BID  . hydrochlorothiazide  12.5 mg Oral Daily  . hyoscyamine  0.125 mg Oral TID AC  . ipratropium-albuterol  3 mL Nebulization QID  . lidocaine (PF)  30 mL Infiltration Once  . lipase/protease/amylase  36,000 Units Oral TID WC  . ondansetron (ZOFRAN) IV  4 mg Intravenous Once  . pantoprazole  40 mg Oral Daily  . propranolol ER  120 mg Oral Daily  . sertraline  50 mg Oral Daily  . traZODone  50 mg Oral QHS   Continuous Infusions: . sodium chloride 50 mL/hr at 03/09/20 2243  .  ceFAZolin (ANCEF) IV 2 g (03/10/20 1003)     LOS: 1 day    Time spent: over 44 min    Fayrene Helper, MD [facility]   To contact the attending provider between 7A-7P or the covering provider during after hours 7P-7A, please log into the web site [email or web address] and access using universal [place] password for that web site. If you do not have the password, please call the hospital operator.  03/10/2020, 3:09 PM

## 2020-03-11 ENCOUNTER — Inpatient Hospital Stay: Payer: Medicare Other

## 2020-03-11 DIAGNOSIS — J90 Pleural effusion, not elsewhere classified: Secondary | ICD-10-CM

## 2020-03-11 DIAGNOSIS — J441 Chronic obstructive pulmonary disease with (acute) exacerbation: Secondary | ICD-10-CM

## 2020-03-11 LAB — CBC WITH DIFFERENTIAL/PLATELET
Abs Immature Granulocytes: 0.03 10*3/uL (ref 0.00–0.07)
Basophils Absolute: 0 10*3/uL (ref 0.0–0.1)
Basophils Relative: 0 %
Eosinophils Absolute: 0 10*3/uL (ref 0.0–0.5)
Eosinophils Relative: 0 %
HCT: 33.7 % — ABNORMAL LOW (ref 36.0–46.0)
Hemoglobin: 9.5 g/dL — ABNORMAL LOW (ref 12.0–15.0)
Immature Granulocytes: 0 %
Lymphocytes Relative: 16 %
Lymphs Abs: 1.1 10*3/uL (ref 0.7–4.0)
MCH: 24 pg — ABNORMAL LOW (ref 26.0–34.0)
MCHC: 28.2 g/dL — ABNORMAL LOW (ref 30.0–36.0)
MCV: 85.1 fL (ref 80.0–100.0)
Monocytes Absolute: 0.7 10*3/uL (ref 0.1–1.0)
Monocytes Relative: 11 %
Neutro Abs: 5 10*3/uL (ref 1.7–7.7)
Neutrophils Relative %: 73 %
Platelets: 277 10*3/uL (ref 150–400)
RBC: 3.96 MIL/uL (ref 3.87–5.11)
RDW: 18.7 % — ABNORMAL HIGH (ref 11.5–15.5)
WBC: 6.9 10*3/uL (ref 4.0–10.5)
nRBC: 0 % (ref 0.0–0.2)

## 2020-03-11 LAB — COMPREHENSIVE METABOLIC PANEL
ALT: <5 U/L (ref 0–44)
AST: 16 U/L (ref 15–41)
Albumin: 2.3 g/dL — ABNORMAL LOW (ref 3.5–5.0)
Alkaline Phosphatase: 80 U/L (ref 38–126)
Anion gap: 9 (ref 5–15)
BUN: 18 mg/dL (ref 8–23)
CO2: 30 mmol/L (ref 22–32)
Calcium: 8.6 mg/dL — ABNORMAL LOW (ref 8.9–10.3)
Chloride: 95 mmol/L — ABNORMAL LOW (ref 98–111)
Creatinine, Ser: 0.52 mg/dL (ref 0.44–1.00)
GFR calc Af Amer: >60 mL/min (ref >60)
GFR calc non Af Amer: >60 mL/min (ref >60)
Glucose, Bld: 84 mg/dL (ref 70–99)
Potassium: 3.9 mmol/L (ref 3.5–5.1)
Sodium: 134 mmol/L — ABNORMAL LOW (ref 135–145)
Total Bilirubin: 0.6 mg/dL (ref 0.3–1.2)
Total Protein: 6.3 g/dL — ABNORMAL LOW (ref 6.5–8.1)

## 2020-03-11 LAB — MAGNESIUM: Magnesium: 1.8 mg/dL (ref 1.7–2.4)

## 2020-03-11 LAB — BRAIN NATRIURETIC PEPTIDE: B Natriuretic Peptide: 355 pg/mL — ABNORMAL HIGH (ref 0.0–100.0)

## 2020-03-11 LAB — PHOSPHORUS: Phosphorus: 2.6 mg/dL (ref 2.5–4.6)

## 2020-03-11 MED ORDER — IPRATROPIUM-ALBUTEROL 0.5-2.5 (3) MG/3ML IN SOLN
3.0000 mL | Freq: Four times a day (QID) | RESPIRATORY_TRACT | Status: DC
  Administered 2020-03-11: 3 mL via RESPIRATORY_TRACT
  Filled 2020-03-11: qty 39

## 2020-03-11 MED ORDER — FUROSEMIDE 10 MG/ML IJ SOLN
40.0000 mg | Freq: Once | INTRAMUSCULAR | Status: AC
  Filled 2020-03-11: qty 4

## 2020-03-11 MED ORDER — FUROSEMIDE 10 MG/ML IJ SOLN
INTRAMUSCULAR | Status: AC
  Administered 2020-03-11: 20:00:00 40 mg via INTRAVENOUS
  Filled 2020-03-11: qty 4

## 2020-03-11 MED ORDER — METOPROLOL TARTRATE 5 MG/5ML IV SOLN
2.5000 mg | Freq: Four times a day (QID) | INTRAVENOUS | Status: DC | PRN
  Administered 2020-03-11 – 2020-03-12 (×2): 2.5 mg via INTRAVENOUS
  Filled 2020-03-11 (×2): qty 5

## 2020-03-11 MED ORDER — MORPHINE SULFATE (PF) 2 MG/ML IV SOLN: 0.5000 mg | INTRAVENOUS | Status: DC | PRN

## 2020-03-11 NOTE — Progress Notes (Signed)
PT Cancellation Note  Patient Details Name: ENDIA PRIER MRN: [id number] DOB: July 22, 1946   Cancelled Treatment:    Reason Eval/Treat Not Completed: Fatigue/lethargy limiting ability to participate. Nsg reporting that patient had some breathing difficulty this AM and is having longer response times to following commands;  recommended holding PT today. Will attempt tomorrow if patient is able to tolerate.   Alanson Puls, [place] DPT 03/11/2020, 10:16 AM

## 2020-03-11 NOTE — Plan of Care (Signed)
Continuing with plan of care. 

## 2020-03-11 NOTE — Progress Notes (Signed)
Entered the patient's room at 0830 and patient noted to have patterns of symmetrical breathing with intermittent asymmetrical breathing, patient is a mouth breather and noted to grunt on expiration, respirations fluctuating between 22-27.  VS:  Temp 97.5, B/P 118/70 (82), HR 91 RR 25, O2 Sat 95% at 2L [place].  Dr. Florene Glen notified of status change and up to floor to see the patient.  Patient placed on continuous pulse oximetry and Central Tele notified.  Morning by mouth medications ok to held at this time per Dr. Florene Glen.  Will continue to monitor the patient.  Currently awaiting pulmonology consult per Dr. Florene Glen.

## 2020-03-11 NOTE — Consult Note (Signed)
Reason for Consult: Exudative right pleural effusion Referring Physician: A Fayrene Helper, Brooke Bonito.  Brandy Armstrong is an 74 y.o. female.  HPI: 74 year old, former smoker (1 PPD x45 years, quit 2012) with past medical history as noted below, known to [facility] for follow-up on COPD with asthmatic bronchitis, presented to [facility] with worsening dyspnea and hypoxemia to 85% on room air.  She had associated cough and productive sputum.  She presented on 09 March 2020.  The patient had been admitted to [facility] on 16 February 2020 with sepsis noted to have MSSA bacteremia of uncertain source.  She received antibiotics and was sent to [facility] where she resides with antibiotics through a PICC line.  She was noted to have a large pleural effusion which in retrospect was noted to be present on February 24 to a smaller degree.  The effusion was tapped twice by IR for stop with 1 L second was 1.2 L.  The pleural fluid has been described as serosanguineous.  The fluid has a lymphocytic predominance, protein of 3.8, glucose of 130 and LDH of 144, pH was not resulted.  Was 1586 cells 12% neutrophils 69% lymphocytes 19% monocyte and 0 eosinophils.  Patient at present is lethargic difficult to arouse but does not appear to be in any respiratory distress.  When she awakens she responds to questions in monosyllables.  No further history can be obtained.    Past Medical History:  Diagnosis Date  . A-fib ([place])   . Barretts esophagus   . Chest pain    a. 02/2014 Myoview: Ef 50%, no ischemia.  . Colon polyps   . COPD (chronic obstructive pulmonary disease) ([place])   . Depression with anxiety   . GERD (gastroesophageal reflux disease)   . Hx of benign essential tremor   . Neuropathy   . Permanent atrial fibrillation [facility])    a. s/p ablation 01/2012 in [facility] by Dr. Boyd Kerbs;  b. On sotalol & Xarelto;  c. 02/2014 Echo: EF 50-55%, mild conc LVH, nl LA size/structure;  d. Recurrent afib 8/15 & 08/29/2014.  [name] Rectal  fistula   . Rheumatoid arthritis ([place])    On methotrexate and orencia  . Urinary incontinence   . Vitamin D deficiency     Past Surgical History:  Procedure Laterality Date  . ABDOMINAL HYSTERECTOMY  1992  . APPENDECTOMY    . CARDIAC ELECTROPHYSIOLOGY STUDY AND ABLATION  2013  . CHOLECYSTECTOMY  1987  . COLONOSCOPY N/A 05/08/2015   Procedure: COLONOSCOPY;  Surgeon: Manya Silvas, MD;  Location: [facility] ENDOSCOPY;  Service: Endoscopy;  Laterality: N/A;  . COLONOSCOPY WITH PROPOFOL N/A 09/29/2019   Procedure: COLONOSCOPY WITH PROPOFOL;  Surgeon: Toledo, Benay Pike, MD;  Location: [facility] ENDOSCOPY;  Service: Gastroenterology;  Laterality: N/A;  . ESOPHAGOGASTRODUODENOSCOPY N/A 05/06/2015   Procedure: ESOPHAGOGASTRODUODENOSCOPY (EGD);  Surgeon: Lollie Sails, MD;  Location: [facility] ENDOSCOPY;  Service: Endoscopy;  Laterality: N/A;  . ESOPHAGOGASTRODUODENOSCOPY (EGD) WITH PROPOFOL N/A 09/29/2019   Procedure: ESOPHAGOGASTRODUODENOSCOPY (EGD) WITH PROPOFOL;  Surgeon: Toledo, Benay Pike, MD;  Location: [facility] ENDOSCOPY;  Service: Gastroenterology;  Laterality: N/A;  . LAPAROSCOPIC APPENDECTOMY N/A 02/19/2019   Procedure: APPENDECTOMY LAPAROSCOPIC converted to open appendectomy;  Surgeon: Herbert Pun, MD;  Location: [facility] ORS;  Service: General;  Laterality: N/A;  . OOPHORECTOMY    . oophrectomy Bilateral 1992  . TEE WITHOUT CARDIOVERSION N/A 02/21/2020   Procedure: TRANSESOPHAGEAL ECHOCARDIOGRAM (TEE);  Surgeon: Wellington Hampshire, MD;  Location: [facility] ORS;  Service: Cardiovascular;  Laterality:  N/A;  . TEE WITHOUT CARDIOVERSION N/A 02/23/2020   Procedure: TRANSESOPHAGEAL ECHOCARDIOGRAM (TEE);  Surgeon: Kate Sable, MD;  Location: [facility] ORS;  Service: Cardiovascular;  Laterality: N/A;    Family History  Problem Relation Age of Onset  . Depression Mother   . Pancreatic cancer Mother   . Suicidality Mother   . Colon cancer Father   . Breast cancer Sister 48  . Diabetes Brother   . Breast  cancer Cousin   . Neuropathy Neg Hx     Social History:  reports that she quit smoking about 8 years ago. She has a 40.00 pack-year smoking history. She has never used smokeless tobacco. She reports previous alcohol use. She reports that she does not use drugs.  Allergies:  Allergies  Allergen Reactions  . Latex     Itching Itching    Medications:  I have reviewed the patient's current medications. Prior to Admission:  Medications Prior to Admission  Medication Sig Dispense Refill Last Dose  . albuterol (VENTOLIN HFA) 108 (90 Base) MCG/ACT inhaler Inhale 2 puffs into the lungs every 4 (four) hours as needed for wheezing or shortness of breath.   prn at prn  . busPIRone (BUSPAR) 10 MG tablet Take 1 tablet (10 mg total) by mouth 3 (three) times daily. 270 tablet 0 03/09/2020 at 1300  . calcium carbonate (TUMS - DOSED IN MG ELEMENTAL CALCIUM) 500 MG chewable tablet Chew 1 tablet by mouth every 4 (four) hours as needed for indigestion or heartburn.   prn at prn  . ceFAZolin (ANCEF) IVPB Inject 2 g into the vein every 8 (eight) hours. Indication:  MSSA bacteremia Last Day of Therapy:  03/26/2020 Labs - Once weekly:  CBC/D, CMP, CRP Please pull PIC at completion of IV antibiotics 96 Units 0 03/09/2020 at 1400  . CREON 36000 units CPEP capsule Takes 2 capsules three times daily.   03/09/2020 at 1200  . cyclobenzaprine (FLEXERIL) 5 MG tablet Take 1 tablet (5 mg total) by mouth 3 (three) times daily as needed for muscle spasms. 30 tablet 1 prn at prn  . diltiazem (CARDIZEM CD) 120 MG 24 hr capsule Take 1 capsule (120 mg total) by mouth daily. (Patient taking differently: Take 240 mg by mouth daily. ) 30 capsule 0 03/09/2020 at 0900  . ELIQUIS 5 MG TABS tablet Take 1 tablet (5 mg total) by mouth 2 (two) times daily. 180 tablet 3 03/09/2020 at 0900  . ertapenem (INVANZ) IVPB Inject 1 g into the vein daily.   03/08/2020 at 1700  . esomeprazole (NEXIUM) 20 MG capsule TAKE 1 CAPSULE BY MOUTH EVERY DAY 90  capsule 1 03/09/2020 at 0630  . Fluticasone-Umeclidin-Vilant (TRELEGY ELLIPTA) 100-62.5-25 MCG/INH AEPB Inhale 1 puff into the lungs daily. 60 each 0 03/09/2020 at 0900  . gabapentin (NEURONTIN) 600 MG tablet TAKE 1 TABLET BY MOUTH THREE TIMES A DAY 90 tablet 1 03/09/2020 at 1300  . Heparin Lock Flush (HEPARIN, PORCINE, LOCK FLUSH IV) Inject into the vein See admin instructions. Flush IV port after IV ABX following saline flush every 8 hours   03/09/2020 at 1400  . HUMIRA PEN 40 MG/0.4ML PNKT Inject 40 mg as directed. Every 2 weeks   Past Month at Unknown time  . hydrochlorothiazide (HYDRODIURIL) 25 MG tablet Take 1 tablet (25 mg total) by mouth daily. (Patient taking differently: Take 12.5 mg by mouth daily. ) 30 tablet 11 03/09/2020 at 0900  . hydroxychloroquine (PLAQUENIL) 200 MG tablet Take 200 mg by mouth 2 (  two) times daily.    03/09/2020 at 0900  . hydrOXYzine (ATARAX/VISTARIL) 50 MG tablet Take 0.5-1 tablets (25-50 mg total) by mouth daily as needed for anxiety. Patient has supplies (Patient taking differently: Take 50 mg by mouth daily as needed for anxiety. ) 90 tablet 0 prn at prn  . hyoscyamine (LEVSIN) 0.125 MG tablet Take 0.125 mg by mouth 3 (three) times daily before meals.    03/09/2020 at 1200  . ibuprofen (ADVIL) 400 MG tablet Take 400 mg by mouth every 8 (eight) hours as needed.   prn at prn  . promethazine (PHENERGAN) 25 MG suppository Place 25 mg rectally every 6 (six) hours as needed for nausea or vomiting.   prn at prn  . propranolol ER (INDERAL LA) 120 MG 24 hr capsule Take 1 capsule (120 mg total) by mouth daily. 90 capsule 3 03/09/2020 at 0900  . sertraline (ZOLOFT) 50 MG tablet TAKE 1 TABLET BY MOUTH EVERY DAY 90 tablet 0 03/09/2020 at 0900  . Sodium Chloride Flush (NORMAL SALINE FLUSH) 0.9 % SOLN Inject 10 mLs into the vein. Flush IV port before ABX every 8 hours   03/09/2020 at 1400  . traZODone (DESYREL) 50 MG tablet Take 1 tablet (50 mg total) by mouth at bedtime. 90 tablet 0  03/08/2020 at 2100    Results for orders placed or performed during the hospital encounter of 03/09/20 (from the past 48 hour(s))  CBC with Differential     Status: Abnormal   Collection Time: 03/09/20  5:02 PM  Result Value Ref Range   WBC 9.3 4.0 - 10.5 K/uL   RBC 3.88 3.87 - 5.11 MIL/uL   Hemoglobin 9.4 (L) 12.0 - 15.0 g/dL   HCT 34.0 (L) 36.0 - 46.0 %   MCV 87.6 80.0 - 100.0 fL   MCH 24.2 (L) 26.0 - 34.0 pg   MCHC 27.6 (L) 30.0 - 36.0 g/dL   RDW 18.2 (H) 11.5 - 15.5 %   Platelets 376 150 - 400 K/uL   nRBC 0.0 0.0 - 0.2 %   Neutrophils Relative % 81 %   Neutro Abs 7.5 1.7 - 7.7 K/uL   Lymphocytes Relative 11 %   Lymphs Abs 1.0 0.7 - 4.0 K/uL   Monocytes Relative 7 %   Monocytes Absolute 0.7 0.1 - 1.0 K/uL   Eosinophils Relative 0 %   Eosinophils Absolute 0.0 0.0 - 0.5 K/uL   Basophils Relative 0 %   Basophils Absolute 0.0 0.0 - 0.1 K/uL   Immature Granulocytes 1 %   Abs Immature Granulocytes 0.10 (H) 0.00 - 0.07 K/uL    Comment: Performed at [facility], [address] [place], [place] [address]  Troponin I (High Sensitivity)     Status: None   Collection Time: 03/09/20  5:02 PM  Result Value Ref Range   Troponin I (High Sensitivity) 7 <18 ng/L    Comment: (NOTE) Elevated high sensitivity troponin I (hsTnI) values and significant  changes across serial measurements may suggest ACS but many other  chronic and acute conditions are known to elevate hsTnI results.  Refer to the "Links" section for chest pain algorithms and additional  guidance. Performed at [facility], [address] [place], [place] [address]   Brain natriuretic peptide     Status: Abnormal   Collection Time: 03/09/20  5:02 PM  Result Value Ref Range   B Natriuretic Peptide 573.0 (H) 0.0 - 100.0 pg/mL    Comment: Performed at [facility], [place], [place] [address]  Comprehensive metabolic panel     Status: Abnormal   Collection Time: 03/09/20  6:17  PM  Result Value Ref Range   Sodium 131 (L) 135 - 145 mmol/L   Potassium 4.5 3.5 - 5.1 mmol/L   Chloride 94 (L) 98 - 111 mmol/L   CO2 29 22 - 32 mmol/L   Glucose, Bld 137 (H) 70 - 99 mg/dL    Comment: Glucose reference range applies only to samples taken after fasting for at least 8 hours.   BUN 24 (H) 8 - 23 mg/dL   Creatinine, Ser 0.59 0.44 - 1.00 mg/dL   Calcium 8.9 8.9 - 10.3 mg/dL   Total Protein 7.4 6.5 - 8.1 g/dL   Albumin 2.6 (L) 3.5 - 5.0 g/dL   AST 15 15 - 41 U/L   ALT <5 0 - 44 U/L   Alkaline Phosphatase 101 38 - 126 U/L   Total Bilirubin 0.6 0.3 - 1.2 mg/dL   GFR calc non Af Amer >60 >60 mL/min   GFR calc Af Amer >60 >60 mL/min   Anion gap 8 5 - 15    Comment: Performed at [facility], [address] [place], [place] [address]  Body fluid culture (includes gram stain)     Status: None (Preliminary result)   Collection Time: 03/09/20  6:52 PM   Specimen: Pleural Fluid  Result Value Ref Range   Specimen Description      PLEURAL Performed at [facility], [address] [place], [place] [address]    Special Requests      PLEURAL Performed at [facility], [place], [place] [address]    Gram Stain      FEW WBC PRESENT, PREDOMINANTLY MONONUCLEAR NO ORGANISMS SEEN    Culture      NO GROWTH 2 DAYS Performed at [facility], [place] [address] [place], [place] [address]    Report Status PENDING   Body fluid cell count with differential     Status: Abnormal   Collection Time: 03/09/20  6:52 PM  Result Value Ref Range   Fluid Type-FCT PLEURAL    Color, Fluid RED (A) YELLOW   Appearance, Fluid CLOUDY (A) CLEAR   Total Nucleated Cell Count, Fluid 1,586 cu mm   Neutrophil Count, Fluid 12 %   Lymphs, Fluid 69 %   Monocyte-Macrophage-Serous Fluid 19 %   Eos, Fluid 0 %    Comment: Performed at [facility], [address] [place], [place] [address]  Glucose, pleural or peritoneal fluid     Status: None    Collection Time: 03/09/20  6:52 PM  Result Value Ref Range   Glucose, Fluid 130 mg/dL    Comment: (NOTE) No normal range established for this test Results should be evaluated in conjunction with serum values    Fluid Type-FGLU PLEURAL     Comment: Performed at [facility], [address] [place], [place] [address]  Protein, pleural or peritoneal fluid     Status: None   Collection Time: 03/09/20  6:52 PM  Result Value Ref Range   Total protein, fluid 3.8 g/dL    Comment: (NOTE) No normal range established for this test Results should be evaluated in conjunction with serum values    Fluid Type-FTP PLEURAL     Comment: Performed at [facility], [address] [place], [place] [address]  Lactate dehydrogenase (pleural or peritoneal  fluid)     Status: Abnormal   Collection Time: 03/09/20  6:52 PM  Result Value Ref Range   LD, Fluid 144 (H) 3 - 23 U/L    Comment: (NOTE) Results should be evaluated in conjunction with serum values    Fluid Type-FLDH PLEURAL     Comment: Performed at [facility], [address] [place], [place] [address]  Pathologist smear review     Status: None   Collection Time: 03/09/20  6:52 PM  Result Value Ref Range   Path Review Cytospin of pleural fluid is reviewed.     Comment: Mesothelial cells and mixed inflammatory cells. Absolute neutrophil count 190 per microliter. Negative for malignancy. Reviewed by Elmon Kirschner, M.D. Performed at [facility], [address] [place], [place] [address]   Respiratory Panel by RT PCR (Flu A&B, Covid) - Nasopharyngeal Swab     Status: None   Collection Time: 03/09/20  8:37 PM   Specimen: Nasopharyngeal Swab  Result Value Ref Range   SARS Coronavirus 2 by RT PCR NEGATIVE NEGATIVE    Comment: (NOTE) SARS-CoV-2 target nucleic acids are NOT DETECTED. The SARS-CoV-2 RNA is generally detectable in upper respiratoy specimens during the acute phase of infection. The  lowest concentration of SARS-CoV-2 viral copies this assay can detect is 131 copies/mL. A negative result does not preclude SARS-Cov-2 infection and should not be used as the sole basis for treatment or other patient management decisions. A negative result may occur with  improper specimen collection/handling, submission of specimen other than nasopharyngeal swab, presence of viral mutation(s) within the areas targeted by this assay, and inadequate number of viral copies (<131 copies/mL). A negative result must be combined with clinical observations, patient history, and epidemiological information. The expected result is Negative. Fact Sheet for Patients:  PinkCheek.be Fact Sheet for Healthcare Providers:  GravelBags.it This test is not yet ap proved or cleared by the [place] FDA and  has been authorized for detection and/or diagnosis of SARS-CoV-2 by FDA under an Emergency Use Authorization (EUA). This EUA will remain  in effect (meaning this test can be used) for the duration of the COVID-19 declaration under Section 564(b)(1) of the Act, 21 U.S.C. section 360bbb-3(b)(1), unless the authorization is terminated or revoked sooner.    Influenza A by PCR NEGATIVE NEGATIVE   Influenza B by PCR NEGATIVE NEGATIVE    Comment: (NOTE) The Xpert Xpress SARS-CoV-2/FLU/RSV assay is intended as an aid in  the diagnosis of influenza from Nasopharyngeal swab specimens and  should not be used as a sole basis for treatment. Nasal washings and  aspirates are unacceptable for Xpert Xpress SARS-CoV-2/FLU/RSV  testing. Fact Sheet for Patients: PinkCheek.be Fact Sheet for Healthcare Providers: GravelBags.it This test is not yet approved or cleared by the [place] FDA and  has been authorized for detection and/or diagnosis of SARS-CoV-2 by  FDA under an Emergency Use  Authorization (EUA). This EUA will remain  in effect (meaning this test can be used) for the duration of the  Covid-19 declaration under Section 564(b)(1) of the Act, 21  U.S.C. section 360bbb-3(b)(1), unless the authorization is  terminated or revoked. Performed at [facility], [place],  [address]   Troponin I (High Sensitivity)     Status: None   Collection Time: 03/09/20  9:30 PM  Result Value Ref Range   Troponin I (High Sensitivity) 13 <18 ng/L    Comment: (NOTE) Elevated high sensitivity troponin I (hsTnI)  values and significant  changes across serial measurements may suggest ACS but many other  chronic and acute conditions are known to elevate hsTnI results.  Refer to the "Links" section for chest pain algorithms and additional  guidance. Performed at [facility], [address] [place], [place] [address]   Culture, blood (routine x 2)     Status: None (Preliminary result)   Collection Time: 03/09/20  9:31 PM   Specimen: BLOOD  Result Value Ref Range   Specimen Description BLOOD LEFT ASSIST CONTROL    Special Requests      BOTTLES DRAWN AEROBIC AND ANAEROBIC Blood Culture adequate volume   Culture      NO GROWTH 2 DAYS Performed at [facility], [address], [place], [place] [address]    Report Status PENDING   Culture, blood (routine x 2)     Status: None (Preliminary result)   Collection Time: 03/09/20  9:45 PM   Specimen: BLOOD  Result Value Ref Range   Specimen Description BLOOD RIGHT ASSIST CONTROL    Special Requests      BOTTLES DRAWN AEROBIC AND ANAEROBIC Blood Culture adequate volume   Culture      NO GROWTH 2 DAYS Performed at [facility], [address], [place], [place] [address]    Report Status PENDING   HIV Antibody (routine testing w rflx)     Status: None   Collection Time: 03/09/20  9:45 PM  Result Value Ref Range   HIV Screen 4th Generation wRfx NON REACTIVE NON REACTIVE     Comment: Performed at [facility], [place] [address], [place], [place] [id number]  Basic metabolic panel     Status: Abnormal   Collection Time: 03/10/20  4:14 AM  Result Value Ref Range   Sodium 133 (L) 135 - 145 mmol/L   Potassium 4.3 3.5 - 5.1 mmol/L   Chloride 95 (L) 98 - 111 mmol/L   CO2 28 22 - 32 mmol/L   Glucose, Bld 127 (H) 70 - 99 mg/dL    Comment: Glucose reference range applies only to samples taken after fasting for at least 8 hours.   BUN 22 8 - 23 mg/dL   Creatinine, Ser 0.51 0.44 - 1.00 mg/dL   Calcium 8.7 (L) 8.9 - 10.3 mg/dL   GFR calc non Af Amer >60 >60 mL/min   GFR calc Af Amer >60 >60 mL/min   Anion gap 10 5 - 15    Comment: Performed at [facility], [address] [place], [place] [address]  CBC     Status: Abnormal   Collection Time: 03/10/20  4:14 AM  Result Value Ref Range   WBC 7.8 4.0 - 10.5 K/uL   RBC 3.78 (L) 3.87 - 5.11 MIL/uL   Hemoglobin 9.2 (L) 12.0 - 15.0 g/dL   HCT 32.4 (L) 36.0 - 46.0 %   MCV 85.7 80.0 - 100.0 fL   MCH 24.3 (L) 26.0 - 34.0 pg   MCHC 28.4 (L) 30.0 - 36.0 g/dL   RDW 18.1 (H) 11.5 - 15.5 %   Platelets 272 150 - 400 K/uL   nRBC 0.0 0.0 - 0.2 %    Comment: Performed at [facility], [address] [place], [place] [address]  Lactate dehydrogenase     Status: None   Collection Time: 03/10/20  8:25 AM  Result Value Ref Range   LDH 159 98 - 192 U/L    Comment: Performed at [facility], [address] [place], [place] [address]  Strep pneumoniae urinary antigen     Status: None   Collection Time: 03/10/20  3:58 PM  Result Value Ref Range   Strep Pneumo Urinary Antigen NEGATIVE NEGATIVE    Comment:        Infection due to S. pneumoniae cannot be absolutely ruled out since the antigen present may be below the detection limit of the test. PERFORMED AT [facility] Performed at [facility], [place] [address], [place], [place] [address]   MRSA PCR Screening     Status: None    Collection Time: 03/10/20  3:58 PM   Specimen: Nasopharyngeal  Result Value Ref Range   MRSA by PCR NEGATIVE NEGATIVE    Comment:        The GeneXpert MRSA Assay (FDA approved for NASAL specimens only), is one component of a comprehensive MRSA colonization surveillance program. It is not intended to diagnose MRSA infection nor to guide or monitor treatment for MRSA infections. Performed at [facility], [address] [place], [place] [address]   CBC with Differential/Platelet     Status: Abnormal   Collection Time: 03/11/20  4:21 AM  Result Value Ref Range   WBC 6.9 4.0 - 10.5 K/uL   RBC 3.96 3.87 - 5.11 MIL/uL   Hemoglobin 9.5 (L) 12.0 - 15.0 g/dL   HCT 33.7 (L) 36.0 - 46.0 %   MCV 85.1 80.0 - 100.0 fL   MCH 24.0 (L) 26.0 - 34.0 pg   MCHC 28.2 (L) 30.0 - 36.0 g/dL   RDW 18.7 (H) 11.5 - 15.5 %   Platelets 277 150 - 400 K/uL   nRBC 0.0 0.0 - 0.2 %   Neutrophils Relative % 73 %   Neutro Abs 5.0 1.7 - 7.7 K/uL   Lymphocytes Relative 16 %   Lymphs Abs 1.1 0.7 - 4.0 K/uL   Monocytes Relative 11 %   Monocytes Absolute 0.7 0.1 - 1.0 K/uL   Eosinophils Relative 0 %   Eosinophils Absolute 0.0 0.0 - 0.5 K/uL   Basophils Relative 0 %   Basophils Absolute 0.0 0.0 - 0.1 K/uL   Immature Granulocytes 0 %   Abs Immature Granulocytes 0.03 0.00 - 0.07 K/uL    Comment: Performed at [facility], [address] [place], [place] [address]  Comprehensive metabolic panel     Status: Abnormal   Collection Time: 03/11/20  4:21 AM  Result Value Ref Range   Sodium 134 (L) 135 - 145 mmol/L   Potassium 3.9 3.5 - 5.1 mmol/L   Chloride 95 (L) 98 - 111 mmol/L   CO2 30 22 - 32 mmol/L   Glucose, Bld 84 70 - 99 mg/dL    Comment: Glucose reference range applies only to samples taken after fasting for at least 8 hours.   BUN 18 8 - 23 mg/dL   Creatinine, Ser 0.52 0.44 - 1.00 mg/dL   Calcium 8.6 (L) 8.9 - 10.3 mg/dL   Total Protein 6.3 (L) 6.5 - 8.1 g/dL   Albumin 2.3 (L) 3.5  - 5.0 g/dL   AST 16 15 - 41 U/L   ALT <5 0 - 44 U/L   Alkaline Phosphatase 80 38 - 126 U/L   Total Bilirubin 0.6 0.3 - 1.2 mg/dL   GFR calc non Af Amer >60 >60 mL/min   GFR calc Af Amer >60 >60 mL/min   Anion gap 9 5 - 15  Comment: Performed at [facility], [address] [place], [place] [address]  Magnesium     Status: None   Collection Time: 03/11/20  4:21 AM  Result Value Ref Range   Magnesium 1.8 1.7 - 2.4 mg/dL    Comment: Performed at [facility], [address] [place], [place] [address]  Phosphorus     Status: None   Collection Time: 03/11/20  4:21 AM  Result Value Ref Range   Phosphorus 2.6 2.5 - 4.6 mg/dL    Comment: Performed at [facility], [place], [place] [address]    CT Chest Wo Contrast  Result Date: 03/09/2020 CLINICAL DATA:  Pleural effusion EXAM: CT CHEST WITHOUT CONTRAST TECHNIQUE: Multidetector CT imaging of the chest was performed following the standard protocol without IV contrast. COMPARISON:  Chest x-ray 03/09/2020, CT 03/11/2014 FINDINGS: Cardiovascular: Limited evaluation without intravenous contrast. Moderate aortic atherosclerosis without aneurysm. Coronary vascular calcification. Mild cardiomegaly. No significant pericardial effusion. Mediastinum/Nodes: Midline trachea. No thyroid mass. Esophagus within normal limits. Borderline enlarged low right paratracheal lymph node measuring 1 cm, series 2, image number 50. Lungs/Pleura: Moderate to large residual right pleural effusion. Emphysema. Consolidation within the right middle lobe and right lower lobe with air bronchograms. The left lung shows no focal airspace disease. Upper Abdomen: No acute abnormality. Musculoskeletal: Degenerative changes. No acute or suspicious osseous abnormality. IMPRESSION: 1. Moderate to large residual right pleural effusion. Consolidation within the right middle lobe and right lower lobe which may be secondary to atelectasis or  pneumonia. Underlying lung mass could be obscured and imaging follow-up to clearing is recommended. 2. Cardiomegaly Aortic Atherosclerosis (ICD10-I70.0) and Emphysema (ICD10-J43.9). Electronically Signed   By: Donavan Foil M.D.   On: 03/09/2020 22:50   DG Chest Port 1 View  Result Date: 03/11/2020 CLINICAL DATA:  Status post right-sided thoracentesis yesterday. Increased work of breathing today. EXAM: PORTABLE CHEST 1 VIEW COMPARISON:  03/10/2020 FINDINGS: There is increased hazy opacity in the right mid and lower lung, with more obscuration of the right hemidiaphragm, findings consistent with an increase in right pleural fluid and. No visible pneumothorax. Clear left lung.  No left pleural effusion. Right PICC is stable. IMPRESSION: 1. Mild interval worsening in lung aeration on the right consistent with an increase in right pleural fluid and atelectasis. 2. No visible pneumothorax. Electronically Signed   By: Lajean Manes M.D.   On: 03/11/2020 09:07   DG Chest Port 1 View  Result Date: 03/10/2020 CLINICAL DATA:  Post right-sided thoracentesis EXAM: PORTABLE CHEST 1 VIEW COMPARISON:  03/09/2020; chest CT-03/09/2020 FINDINGS: Grossly unchanged enlarged cardiac silhouette and mediastinal contours with persistent partial obscuration the right heart border secondary to right medial basilar heterogeneous/consolidative opacities. Interval reduction/near resolution of right-sided pleural effusion post thoracentesis with improved aeration of the right mid and lower lung. Interval development of a tiny loculated right basilar ex vacuo hydropneumothorax. No mediastinal shift. The left hemithorax remains well aerated. No new focal airspace opacities. Mild pulmonary congestion without frank evidence of edema. Stable position of support apparatus. No acute osseous abnormalities. Post cholecystectomy. IMPRESSION: 1. Interval reduction/near resolution of right-sided pleural effusion post thoracentesis with development  of tiny right basilar ex vacuo hydropneumothorax. 2. Improved aeration of the right mid and lower lung with persistent right medial basilar heterogeneous/consolidative opacities, atelectasis versus infiltrate (favored). Electronically Signed   By: Sandi Mariscal M.D.   On: 03/10/2020 14:12   DG Chest Portable 1 View  Result Date: 03/09/2020 CLINICAL DATA:  Status post thoracentesis. EXAM: PORTABLE CHEST 1 VIEW COMPARISON:  Radiograph earlier this day. FINDINGS: Large right pleural effusion with slight decrease in size from earlier today. Small portion of aerated lung at the right lung apex is no seen. Right upper extremity PICC remains in place. No left pleural effusion. Mild left peribronchial thickening. No visualized pneumothorax. IMPRESSION: 1. Decreased size of large right pleural effusion after thoracentesis today. Large volume of pleural fluid persists. No pneumothorax. 2. Right upper extremity PICC remains in place. Electronically Signed   By: Keith Rake M.D.   On: 03/09/2020 19:23   DG Chest Portable 1 View  Result Date: 03/09/2020 CLINICAL DATA:  Shortness of breath EXAM: PORTABLE CHEST 1 VIEW COMPARISON:  02/23/2020 FINDINGS: Right upper extremity catheter tip terminates at expected location of SVC. Interval complete opacification of right thorax. Left lung grossly clear. Obscured cardiomediastinal silhouette. IMPRESSION: Interval complete opacification of the right thorax which may be secondary to large pleural effusion or diffuse airspace disease, or combination of the 2. Central obstructing process is also possible. Electronically Signed   By: Donavan Foil M.D.   On: 03/09/2020 17:43   US THORACENTESIS ASP PLEURAL SPACE W/IMG GUIDE  Result Date: 03/10/2020 INDICATION: Symptomatic right-sided pleural effusion. Please from ultrasound-guided thoracentesis for diagnostic purposes. EXAM: US THORACENTESIS ASP PLEURAL SPACE W/IMG GUIDE COMPARISON:  Chest CT - 03/09/2020; chest radiograph -  03/10/2019 MEDICATIONS: None. COMPLICATIONS: SIR Level A - No therapy, no consequence. Procedure complicated by development of a tiny right basilar ex vacuo hydropneumothorax. TECHNIQUE: Informed written consent was obtained from the patient after a discussion of the risks, benefits and alternatives to treatment. A timeout was performed prior to the initiation of the procedure. Initial ultrasound scanning demonstrates a moderate to large sized residual anechoic right-sided pleural effusion. The skin overlying the inferolateral lower chest was prepped and draped in the usual sterile fashion. 1% lidocaine was used for local anesthesia. An ultrasound image was saved for documentation purposes. An 8 Fr Safe-T-Centesis catheter was introduced. The thoracentesis was performed. The catheter was removed and a dressing was applied. The patient tolerated the procedure well without immediate post procedural complication. The patient was escorted to have an upright chest radiograph. FINDINGS: A total of approximately 1.2 liters of bloody pleural fluid was removed. Requested samples were sent to the laboratory. IMPRESSION: Successful ultrasound-guided right sided thoracentesis yielding 1.2 liters of bloody pleural fluid. Electronically Signed   By: Sandi Mariscal M.D.   On: 03/10/2020 14:23    Review of Systems  Unable to perform ROS: Mental status change  Lethargic, very terse responses  Blood pressure 128/87, pulse 96, temperature 97.8 F (36.6 C), temperature source Oral, resp. rate 20, height 5\' 6"  (1.676 m), weight 88.1 kg, SpO2 93 %. Physical Exam GENERAL: Sleepy/lethargic, arousable responds in monosyllables, no respiratory distress, laying flat in bed.  On nasal cannula O2. HEAD: Normocephalic, atraumatic.  EYES: Pupils equal, round, reactive to light.  No scleral icterus.  MOUTH: Poor dentition, oral mucosa moist NECK: Supple. No thyromegaly. No nodules. No JVD.  Trachea midline. PULMONARY: Diminished lung  sounds on the right, no wheezes, rhonchi in the upper lung zones.[name] CARDIOVASCULAR: S1 and S2.  Irregular rate and rhythm with controlled ventricular response.  No murmurs appreciated. GASTROINTESTINAL: Soft, nondistended, normoactive bowel sounds.  No grimacing on palpation. MUSCULOSKELETAL: Mild RA changes on the hands, no clubbing, no edema.  NEUROLOGIC: Lethargic, moves all fours, no overt focality. SKIN: Intact,warm,dry.  No rashes PSYCH: Lethargic, cannot assess mood  or behavior.   All films have been independently reviewed.  She had a very large pleural effusion on 11 March that opacified the whole entire right chest.  He required thoracentesis on 11 March and 12 March.  Fluid removed on 11 March with 1 L fluid removed on 12 March was 1.2 L.  Fluid was serosanguineous on both occasions by description.  She has had a chest x-ray today that shows already that the effusion is increasing on the right.  Assessment/Plan:  RIGHT exudative pleural effusion Lymphocytic predominant/serosanguineous Difficult to determine if serosanguineous appearance was due to anticoagulation Serosanguineous pleural effusions are usually indicative of either TB or malignancy Lymphocytic pleural effusions are usually indicative of TB, malignancy and/or longstanding effusion with increased inflammation Patient has been on adalimumab for rheumatoid arthritis and this is usually associated with potential TB reactivation Recommend isolation Check QuantiFERON gold Await cultures from pleural fluid Check she will need pleural drainage again recommend holding off on apixaban and schedule possibly for Monday, if significant reaccumulation may benefit from pigtail chest tube placement Serial chest x-rays follow-up on the accumulation of fluid Patient currently appears to be poor candidate for VATS/pleurodesis but may be a candidate for Pleurx if significant reaccumulation continue to occur Consider thoracic surgery  consult pending need for further drainage Hold Plaquenil and adalimumab Agree with current antibiotics until cultures known  COPD with mild exacerbation due to the above Bronchodilator therapy Avoid steroids currently due to potential infectious process Pulmonary toilet Supplemental oxygen as needed for oxygen saturations of 88 to 92%  Recent MSSA bacteremia Cannot exclude pneumonia currently (atelectasis versus pneumonia) Agree with continuing cefepime for now Follow cultures Trend procalcitonin  Permanent atrial fibrillation on Eliquis No atrial thrombus noted on recent TEE (2/24) Recommend holding Eliquis for potential invasive procedures SCDs for DVT prophylaxis   Adult failure to thrive following MSSA bacteremia PT OT Likely will need to return to SNF Consider palliative care consult when patient more alert  Lethargy Likely due to acute illness Close monitoring Avoid sedating medications Frequent reorientation    C. Derrill Kay, MD [facility] 03/11/2020, 12:24 PM    *This note was dictated using voice recognition software/Dragon.  Despite best efforts to proofread, errors can occur which can change the meaning.  Any change was purely unintentional.

## 2020-03-11 NOTE — Progress Notes (Signed)
Patient ID: Brandy Armstrong, female   DOB: 12-03-1946, 74 y.o.   MRN: [id number] Interventional Radiology  Note   reviewed cxrs and chest CT from 3/11 through today.  Minimal reaccumulation of the right eff following 1.2 L rt thora yesterday.    Would follow daily cxr and watch for significant reaccumulation before placing a chest tube.  D/w Dr Florene Glen

## 2020-03-11 NOTE — Progress Notes (Addendum)
PROGRESS NOTE    Brandy Armstrong  HGD:[id number] DOB: 04-15-1946 DOA: 03/09/2020 PCP: [name], FNP   Brief Narrative:  Brandy Armstrong  is Brandy Armstrong 74 y.o. Caucasian female with [name] known history of multiple medical problems that are mentioned below including COPD, atrial fibrillation, on Eliquis, peripheral neuropathy and rheumatoid arthritis, who was recently admitted for MSSA bacteremia of unclear etiology for which she continues to take IV antibiotic therapy through PICC line at [facility] facility where she resides.  She presents to the emergency room with acute onset of worsening dyspnea with hypoxemia to 85% on room air with associated cough occasionally productive of yellowish sputum without fever or chills.  She denied any nausea or vomiting or abdominal pain.  No worsening lower extremity edema..  She is dyspneic in all positions. The patient underwent ultrasound-guided right thoracentesis in the ER with drainage of 1 L of pleural fluid.  Upon presentation to the emergency room, blood pressure was 140/98 and pulse oximetry was up to 90% on 6 L O2 by nasal cannula.  Temperature was 98.1 and heart rate was 103.  Respiratory rate was 20 and later 22 and heart rate later on was up to 112.  Labs revealed anemia with hemoglobin of 9.4 hematocrit 34 comparable to previous levels on 2/21 with otherwise unremarkable CMP.  BNP was 573 compared to 519 on 2/23 and high-sensitivity troponin I was 7.  Chest x-ray initially showed whiteout right lung.  Pleural fluid analysis showed 69 lymphocytes and 12 neutrophils with total protein of 3.8 with LDH of 144. EKG showed atrial fibrillation with slight rapid ventricular response of 108 with Q waves inferiorly and T wave version laterally as well as prolonged QT interval.  The patient's oxygen requirement currently improved to 3.5 L O2 with pulse ox at 95 to 97% after right thoracentesis.  The patient was given nebulized albuterol as well as  duonebs twice, IV cefepime vancomycin and Flagyl.  She will be admitted to [facility] monitored bed for further evaluation and management.  Assessment & Plan:   Active Problems:   Pleural effusion  Exudative Right Pleural Effusion  Acute Hypoxic Respiratory Failure:  Currently requiring 3 L Amo S/p thoracentesis in ED with 1 L serosanguinous fluid removed on 3/11 3/12 with thoracentesis by IR with 1.2 L bloody pleural fluid -> c/b R basilar ex vacuo hydropneumothorax Repeat CXR 3/12 with interval reduction of R sided pleural effusion -> tiny R basilar ex vacuo hydropneumothorax.  Improved aeration of the R mid and lower lung with persistent R medial basilar heterogeneous/consolidative opacities (atelectasis vs infiltrate) CXR 3/13 with mild interval worsening in lung aeration on the R consistent with increase in R pleural fluid and atelectasis Exudative by lights Follow cytology (pending) and culture (NG) Pulm c/s, appreciate recs -> recommended quant gold, isolation, if reaccumulation of fluid, may need chest tube (follow serial CXR), consider CT surgery, hold plaquenil and adalimumab Cefepime Blood cx NGTD Body fluid cx with NG Elevated BNP, diurese as tolerated  2.  Recent MSSA bacteremia. -We will continue IV Ancef that she supposed to take till 03/26/2020. (switch to cefepime for now to cover for pneumonia) -The patient has finished Valerie Cones course of IV Invanz on 03/08/2020 (will need to review this, I don't see this clearly? Family member did acknowledge she was treated with abx at [facility])  # Delirium: Tedi Hughson bit more confused today.  Likely 2/2 hypoxia and hospitalization.  Per family, she's been declining from mental  status standpoint (and in general) over the past years and has been confused when hospitalized before.  3.  COPD. -She does not seem to have any current acute flare. -She will be placed on duo nebs as mentioned above.  4.  Rheumatoid arthritis. -We will hold Plaquenil given  prolonged QT interval. - Repeat EKG -> improved qtc  5.  Hypertension. -We will continue HCTZ and Cardizem CD.  6.  Atrial fibrillation with mildly rapid ventricular response, likely secondary to #1. - Hold eliquis in preparation for procedure.  Short acting cardizem.  Adjust dose as needed.  7.  Anxiety. -We will continue BuSpar.  8.  DVT prophylaxis. -eliquis on hold  DVT prophylaxis: SCD, hold eliquis in case of procedure Code Status: full  Family Communication: Gunnar Fusi Disposition Plan:  . Patient came from: SNF            . Anticipated d/c place: SNF . Barriers to d/c OR conditions which need to be met to effect Eulene Pekar safe d/c: pending further eval and management of effusion and SOB   Consultants:   IR  Pulm  Procedures:   Thoracentesis 3/11 and 3/12  Antimicrobials:  Anti-infectives (From admission, onward)   Start     Dose/Rate Route Frequency Ordered Stop   03/10/20 1800  ceFEPIme (MAXIPIME) 2 g in sodium chloride 0.9 % 100 mL IVPB     2 g 200 mL/hr over 30 Minutes Intravenous Every 8 hours 03/10/20 1529     03/10/20 1600  piperacillin-tazobactam (ZOSYN) IVPB 3.375 g  Status:  Discontinued     3.375 g 12.5 mL/hr over 240 Minutes Intravenous Every 8 hours 03/10/20 1519 03/10/20 1529   03/10/20 1000  ceFAZolin (ANCEF) IVPB 2g/100 mL premix  Status:  Discontinued     2 g 200 mL/hr over 30 Minutes Intravenous Every 8 hours 03/10/20 0624 03/10/20 1519   03/09/20 2200  ceFAZolin (ANCEF) IVPB  Status:  Discontinued    Note to Pharmacy: Indication:  MSSA bacteremia Last Day of Therapy:  03/26/2020 Labs - Once weekly:  CBC/D, CMP, CRP Please pull PIC at completion of IV antibiotics     2 g Intravenous Every 8 hours 03/09/20 2037 03/09/20 2144   03/09/20 2200  hydroxychloroquine (PLAQUENIL) tablet 200 mg  Status:  Discontinued     200 mg Oral 2 times daily 03/09/20 2037 03/10/20 0015   03/09/20 2200  ceFAZolin (ANCEF) IVPB 2g/100 mL premix  Status:   Discontinued     2 g 200 mL/hr over 30 Minutes Intravenous Every 8 hours 03/09/20 2144 03/10/20 0624   03/09/20 2015  ceFEPIme (MAXIPIME) 1 g in sodium chloride 0.9 % 100 mL IVPB  Status:  Discontinued     1 g 200 mL/hr over 30 Minutes Intravenous Once 03/09/20 2008 03/09/20 2144   03/09/20 2015  vancomycin (VANCOCIN) IVPB 1000 mg/200 mL premix  Status:  Discontinued     1,000 mg 200 mL/hr over 60 Minutes Intravenous  Once 03/09/20 2008 03/10/20 0117   03/09/20 2015  metroNIDAZOLE (FLAGYL) IVPB 500 mg  Status:  Discontinued     500 mg 100 mL/hr over 60 Minutes Intravenous  Once 03/09/20 2008 03/10/20 0117     Subjective: Disoriented No new complaints, still SOB  Objective: Vitals:   03/11/20 1236 03/11/20 1536 03/11/20 1549 03/11/20 1826  BP: 136/79  140/85 (!) 135/100  Pulse: (!) 104  88 96  Resp: (!) '22  20 20  '$ Temp: 97.6 F (36.4 C)  (!)  97.5 F (36.4 C) 97.7 F (36.5 C)  TempSrc: Oral  Oral Oral  SpO2: 96% 98%  96%  Weight:      Height:        Intake/Output Summary (Last 24 hours) at 03/11/2020 1828 Last data filed at 03/11/2020 1549 Gross per 24 hour  Intake 1113.63 ml  Output --  Net 1113.63 ml   Filed Weights   03/09/20 1710 03/09/20 2351  Weight: 81.6 kg 88.1 kg    Examination:  General exam: NAD Respiratory system: tachypneic, increased WOB, diminished Cardiovascular system: tachy irreg irreg Gastrointestinal system: Abdomen is nondistended, soft and nontender.  Central nervous system: Alert and disoriented, staring off at times. No focal neurological deficits. Extremities: no LEE Skin: No rashes, lesions or ulcers  Data Reviewed: I have personally reviewed following labs and imaging studies  CBC: Recent Labs  Lab 03/09/20 1702 03/10/20 0414 03/11/20 0421  WBC 9.3 7.8 6.9  NEUTROABS 7.5  --  5.0  HGB 9.4* 9.2* 9.5*  HCT 34.0* 32.4* 33.7*  MCV 87.6 85.7 85.1  PLT 376 272 893   Basic Metabolic Panel: Recent Labs  Lab 03/09/20 1817  03/10/20 0414 03/11/20 0421  NA 131* 133* 134*  K 4.5 4.3 3.9  CL 94* 95* 95*  CO2 '29 28 30  '$ GLUCOSE 137* 127* 84  BUN 24* 22 18  CREATININE 0.59 0.51 0.52  CALCIUM 8.9 8.7* 8.6*  MG  --   --  1.8  PHOS  --   --  2.6   GFR: Estimated Creatinine Clearance: 72.1 mL/min (by C-G formula based on SCr of 0.52 mg/dL). Liver Function Tests: Recent Labs  Lab 03/09/20 1817 03/11/20 0421  AST 15 16  ALT <5 <5  ALKPHOS 101 80  BILITOT 0.6 0.6  PROT 7.4 6.3*  ALBUMIN 2.6* 2.3*   No results for input(s): LIPASE, AMYLASE in the last 168 hours. No results for input(s): AMMONIA in the last 168 hours. Coagulation Profile: No results for input(s): INR, PROTIME in the last 168 hours. Cardiac Enzymes: No results for input(s): CKTOTAL, CKMB, CKMBINDEX, TROPONINI in the last 168 hours. BNP (last 3 results) No results for input(s): PROBNP in the last 8760 hours. HbA1C: No results for input(s): HGBA1C in the last 72 hours. CBG: No results for input(s): GLUCAP in the last 168 hours. Lipid Profile: No results for input(s): CHOL, HDL, LDLCALC, TRIG, CHOLHDL, LDLDIRECT in the last 72 hours. Thyroid Function Tests: No results for input(s): TSH, T4TOTAL, FREET4, T3FREE, THYROIDAB in the last 72 hours. Anemia Panel: No results for input(s): VITAMINB12, FOLATE, FERRITIN, TIBC, IRON, RETICCTPCT in the last 72 hours. Sepsis Labs: No results for input(s): PROCALCITON, LATICACIDVEN in the last 168 hours.  Recent Results (from the past 240 hour(s))  Body fluid culture (includes gram stain)     Status: None (Preliminary result)   Collection Time: 03/09/20  6:52 PM   Specimen: Pleural Fluid  Result Value Ref Range Status   Specimen Description   Final    PLEURAL Performed at [facility], [address], [place], [place] [address]    Special Requests   Final    PLEURAL Performed at [facility], [address] [place], [place] [address]    Gram Stain   Final    FEW WBC  PRESENT, PREDOMINANTLY MONONUCLEAR NO ORGANISMS SEEN    Culture   Final    NO GROWTH 2 DAYS Performed at [facility], [place] [address], [place], [place] [address]  Report Status PENDING  Incomplete  Respiratory Panel by RT PCR (Flu Kynzleigh Bandel&B, Covid) - Nasopharyngeal Swab     Status: None   Collection Time: 03/09/20  8:37 PM   Specimen: Nasopharyngeal Swab  Result Value Ref Range Status   SARS Coronavirus 2 by RT PCR NEGATIVE NEGATIVE Final    Comment: (NOTE) SARS-CoV-2 target nucleic acids are NOT DETECTED. The SARS-CoV-2 RNA is generally detectable in upper respiratoy specimens during the acute phase of infection. The lowest concentration of SARS-CoV-2 viral copies this assay can detect is 131 copies/mL. Fabian Coca negative result does not preclude SARS-Cov-2 infection and should not be used as the sole basis for treatment or other patient management decisions. Ashlei Chinchilla negative result may occur with  improper specimen collection/handling, submission of specimen other than nasopharyngeal swab, presence of viral mutation(s) within the areas targeted by this assay, and inadequate number of viral copies (<131 copies/mL). Alyxandria Wentz negative result must be combined with clinical observations, patient history, and epidemiological information. The expected result is Negative. Fact Sheet for Patients:  PinkCheek.be Fact Sheet for Healthcare Providers:  GravelBags.it This test is not yet ap proved or cleared by the [place] FDA and  has been authorized for detection and/or diagnosis of SARS-CoV-2 by FDA under an Emergency Use Authorization (EUA). This EUA will remain  in effect (meaning this test can be used) for the duration of the COVID-19 declaration under Section 564(b)(1) of the Act, 21 U.S.C. section 360bbb-3(b)(1), unless the authorization is terminated or revoked sooner.    Influenza Jafeth Mustin by PCR NEGATIVE NEGATIVE Final   Influenza B by PCR  NEGATIVE NEGATIVE Final    Comment: (NOTE) The Xpert Xpress SARS-CoV-2/FLU/RSV assay is intended as an aid in  the diagnosis of influenza from Nasopharyngeal swab specimens and  should not be used as Joanthony Hamza sole basis for treatment. Nasal washings and  aspirates are unacceptable for Xpert Xpress SARS-CoV-2/FLU/RSV  testing. Fact Sheet for Patients: PinkCheek.be Fact Sheet for Healthcare Providers: GravelBags.it This test is not yet approved or cleared by the [place] FDA and  has been authorized for detection and/or diagnosis of SARS-CoV-2 by  FDA under an Emergency Use Authorization (EUA). This EUA will remain  in effect (meaning this test can be used) for the duration of the  Covid-19 declaration under Section 564(b)(1) of the Act, 21  U.S.C. section 360bbb-3(b)(1), unless the authorization is  terminated or revoked. Performed at [facility], [address] [place], [place] [address]   Culture, blood (routine x 2)     Status: None (Preliminary result)   Collection Time: 03/09/20  9:31 PM   Specimen: BLOOD  Result Value Ref Range Status   Specimen Description BLOOD LEFT ASSIST CONTROL  Final   Special Requests   Final    BOTTLES DRAWN AEROBIC AND ANAEROBIC Blood Culture adequate volume   Culture   Final    NO GROWTH 2 DAYS Performed at [facility], [address], [place], [place] [address]    Report Status PENDING  Incomplete  Culture, blood (routine x 2)     Status: None (Preliminary result)   Collection Time: 03/09/20  9:45 PM   Specimen: BLOOD  Result Value Ref Range Status   Specimen Description BLOOD RIGHT ASSIST CONTROL  Final   Special Requests   Final    BOTTLES DRAWN AEROBIC AND ANAEROBIC Blood Culture adequate volume   Culture   Final    NO GROWTH 2 DAYS Performed at [facility], [place]  [address] [place], [place] [address]    Report Status PENDING  Incomplete  MRSA PCR  Screening     Status: None   Collection Time: 03/10/20  3:58 PM   Specimen: Nasopharyngeal  Result Value Ref Range Status   MRSA by PCR NEGATIVE NEGATIVE Final    Comment:        The GeneXpert MRSA Assay (FDA approved for NASAL specimens only), is one component of Ledell Codrington comprehensive MRSA colonization surveillance program. It is not intended to diagnose MRSA infection nor to guide or monitor treatment for MRSA infections. Performed at [facility], [address], [place], [place] [address]          Radiology Studies: CT Chest Wo Contrast  Result Date: 03/09/2020 CLINICAL DATA:  Pleural effusion EXAM: CT CHEST WITHOUT CONTRAST TECHNIQUE: Multidetector CT imaging of the chest was performed following the standard protocol without IV contrast. COMPARISON:  Chest x-ray 03/09/2020, CT 03/11/2014 FINDINGS: Cardiovascular: Limited evaluation without intravenous contrast. Moderate aortic atherosclerosis without aneurysm. Coronary vascular calcification. Mild cardiomegaly. No significant pericardial effusion. Mediastinum/Nodes: Midline trachea. No thyroid mass. Esophagus within normal limits. Borderline enlarged low right paratracheal lymph node measuring 1 cm, series 2, image number 50. Lungs/Pleura: Moderate to large residual right pleural effusion. Emphysema. Consolidation within the right middle lobe and right lower lobe with air bronchograms. The left lung shows no focal airspace disease. Upper Abdomen: No acute abnormality. Musculoskeletal: Degenerative changes. No acute or suspicious osseous abnormality. IMPRESSION: 1. Moderate to large residual right pleural effusion. Consolidation within the right middle lobe and right lower lobe which may be secondary to atelectasis or pneumonia. Underlying lung mass could be obscured and imaging follow-up to clearing is recommended. 2. Cardiomegaly Aortic Atherosclerosis (ICD10-I70.0) and Emphysema (ICD10-J43.9). Electronically Signed   By: Donavan Foil M.D.   On: 03/09/2020 22:50   DG Chest Port 1 View  Result Date: 03/11/2020 CLINICAL DATA:  Status post right-sided thoracentesis yesterday. Increased work of breathing today. EXAM: PORTABLE CHEST 1 VIEW COMPARISON:  03/10/2020 FINDINGS: There is increased hazy opacity in the right mid and lower lung, with more obscuration of the right hemidiaphragm, findings consistent with an increase in right pleural fluid and. No visible pneumothorax. Clear left lung.  No left pleural effusion. Right PICC is stable. IMPRESSION: 1. Mild interval worsening in lung aeration on the right consistent with an increase in right pleural fluid and atelectasis. 2. No visible pneumothorax. Electronically Signed   By: Lajean Manes M.D.   On: 03/11/2020 09:07   DG Chest Port 1 View  Result Date: 03/10/2020 CLINICAL DATA:  Post right-sided thoracentesis EXAM: PORTABLE CHEST 1 VIEW COMPARISON:  03/09/2020; chest CT-03/09/2020 FINDINGS: Grossly unchanged enlarged cardiac silhouette and mediastinal contours with persistent partial obscuration the right heart border secondary to right medial basilar heterogeneous/consolidative opacities. Interval reduction/near resolution of right-sided pleural effusion post thoracentesis with improved aeration of the right mid and lower lung. Interval development of Dennie Moltz tiny loculated right basilar ex vacuo hydropneumothorax. No mediastinal shift. The left hemithorax remains well aerated. No new focal airspace opacities. Mild pulmonary congestion without frank evidence of edema. Stable position of support apparatus. No acute osseous abnormalities. Post cholecystectomy. IMPRESSION: 1. Interval reduction/near resolution of right-sided pleural effusion post thoracentesis with development of tiny right basilar ex vacuo hydropneumothorax. 2. Improved aeration of the right mid and lower lung with persistent right medial basilar heterogeneous/consolidative opacities, atelectasis versus infiltrate  (favored). Electronically Signed   By: Sandi Mariscal M.D.   On: 03/10/2020  14:12   DG Chest Portable 1 View  Result Date: 03/09/2020 CLINICAL DATA:  Status post thoracentesis. EXAM: PORTABLE CHEST 1 VIEW COMPARISON:  Radiograph earlier this day. FINDINGS: Large right pleural effusion with slight decrease in size from earlier today. Small portion of aerated lung at the right lung apex is no seen. Right upper extremity PICC remains in place. No left pleural effusion. Mild left peribronchial thickening. No visualized pneumothorax. IMPRESSION: 1. Decreased size of large right pleural effusion after thoracentesis today. Large volume of pleural fluid persists. No pneumothorax. 2. Right upper extremity PICC remains in place. Electronically Signed   By: Keith Rake M.D.   On: 03/09/2020 19:23   US THORACENTESIS ASP PLEURAL SPACE W/IMG GUIDE  Result Date: 03/10/2020 INDICATION: Symptomatic right-sided pleural effusion. Please from ultrasound-guided thoracentesis for diagnostic purposes. EXAM: US THORACENTESIS ASP PLEURAL SPACE W/IMG GUIDE COMPARISON:  Chest CT - 03/09/2020; chest radiograph - 03/10/2019 MEDICATIONS: None. COMPLICATIONS: SIR Level Araya Roel - No therapy, no consequence. Procedure complicated by development of Kendall Justo tiny right basilar ex vacuo hydropneumothorax. TECHNIQUE: Informed written consent was obtained from the patient after Amma Crear discussion of the risks, benefits and alternatives to treatment. Jaice Digioia timeout was performed prior to the initiation of the procedure. Initial ultrasound scanning demonstrates Tilden Broz moderate to large sized residual anechoic right-sided pleural effusion. The skin overlying the inferolateral lower chest was prepped and draped in the usual sterile fashion. 1% lidocaine was used for local anesthesia. An ultrasound image was saved for documentation purposes. An 8 Fr Safe-T-Centesis catheter was introduced. The thoracentesis was performed. The catheter was removed and Tiffany Calmes dressing was applied.  The patient tolerated the procedure well without immediate post procedural complication. The patient was escorted to have an upright chest radiograph. FINDINGS: Walter Grima total of approximately 1.2 liters of bloody pleural fluid was removed. Requested samples were sent to the laboratory. IMPRESSION: Successful ultrasound-guided right sided thoracentesis yielding 1.2 liters of bloody pleural fluid. Electronically Signed   By: Sandi Mariscal M.D.   On: 03/10/2020 14:23        Scheduled Meds: . busPIRone  10 mg Oral TID  . Chlorhexidine Gluconate Cloth  6 each Topical Daily  . diltiazem  60 mg Oral Q6H  . feeding supplement (ENSURE ENLIVE)  237 mL Oral BID BM  . guaiFENesin  600 mg Oral BID  . hydrochlorothiazide  12.5 mg Oral Daily  . hyoscyamine  0.125 mg Oral TID AC  . ipratropium-albuterol  3 mL Nebulization Q6H  . lidocaine (PF)  30 mL Infiltration Once  . lipase/protease/amylase  36,000 Units Oral TID WC  . pantoprazole  40 mg Oral Daily  . propranolol ER  120 mg Oral Daily  . sertraline  50 mg Oral Daily  . traZODone  50 mg Oral QHS   Continuous Infusions: . ceFEPime (MAXIPIME) IV 2 g (03/11/20 1549)     LOS: 2 days    Time spent: over 30 min    Fayrene Helper, MD [facility]   To contact the attending provider between 7A-7P or the covering provider during after hours 7P-7A, please log into the web site [email or web address] and access using universal [place] password for that web site. If you do not have the password, please call the hospital operator.  03/11/2020, 6:28 PM

## 2020-03-11 NOTE — Progress Notes (Signed)
Dr. Florene Glen aware that patient is not taking po medications at this time.  Patient respiratory status is stable and with improvement from previous assessment.

## 2020-03-12 ENCOUNTER — Inpatient Hospital Stay: Payer: Medicare Other

## 2020-03-12 LAB — CBC WITH DIFFERENTIAL/PLATELET
Abs Immature Granulocytes: 0.02 10*3/uL (ref 0.00–0.07)
Basophils Absolute: 0 10*3/uL (ref 0.0–0.1)
Basophils Relative: 0 %
Eosinophils Absolute: 0.1 10*3/uL (ref 0.0–0.5)
Eosinophils Relative: 2 %
HCT: 34.7 % — ABNORMAL LOW (ref 36.0–46.0)
Hemoglobin: 10 g/dL — ABNORMAL LOW (ref 12.0–15.0)
Immature Granulocytes: 0 %
Lymphocytes Relative: 11 %
Lymphs Abs: 0.6 10*3/uL — ABNORMAL LOW (ref 0.7–4.0)
MCH: 24.3 pg — ABNORMAL LOW (ref 26.0–34.0)
MCHC: 28.8 g/dL — ABNORMAL LOW (ref 30.0–36.0)
MCV: 84.4 fL (ref 80.0–100.0)
Monocytes Absolute: 0.7 10*3/uL (ref 0.1–1.0)
Monocytes Relative: 12 %
Neutro Abs: 4.1 10*3/uL (ref 1.7–7.7)
Neutrophils Relative %: 75 %
Platelets: 251 10*3/uL (ref 150–400)
RBC: 4.11 MIL/uL (ref 3.87–5.11)
RDW: 18.8 % — ABNORMAL HIGH (ref 11.5–15.5)
WBC: 5.5 10*3/uL (ref 4.0–10.5)
nRBC: 0 % (ref 0.0–0.2)

## 2020-03-12 LAB — PHOSPHORUS: Phosphorus: 2.5 mg/dL (ref 2.5–4.6)

## 2020-03-12 LAB — COMPREHENSIVE METABOLIC PANEL
ALT: <5 U/L (ref 0–44)
AST: 19 U/L (ref 15–41)
Albumin: 2.4 g/dL — ABNORMAL LOW (ref 3.5–5.0)
Alkaline Phosphatase: 92 U/L (ref 38–126)
Anion gap: 12 (ref 5–15)
BUN: 14 mg/dL (ref 8–23)
CO2: 36 mmol/L — ABNORMAL HIGH (ref 22–32)
Calcium: 8.5 mg/dL — ABNORMAL LOW (ref 8.9–10.3)
Chloride: 87 mmol/L — ABNORMAL LOW (ref 98–111)
Creatinine, Ser: 0.5 mg/dL (ref 0.44–1.00)
GFR calc Af Amer: >60 mL/min (ref >60)
GFR calc non Af Amer: >60 mL/min (ref >60)
Glucose, Bld: 82 mg/dL (ref 70–99)
Potassium: 3.1 mmol/L — ABNORMAL LOW (ref 3.5–5.1)
Sodium: 135 mmol/L (ref 135–145)
Total Bilirubin: 1 mg/dL (ref 0.3–1.2)
Total Protein: 6.5 g/dL (ref 6.5–8.1)

## 2020-03-12 LAB — BASIC METABOLIC PANEL
Anion gap: 15 (ref 5–15)
BUN: 15 mg/dL (ref 8–23)
CO2: 36 mmol/L — ABNORMAL HIGH (ref 22–32)
Calcium: 8.6 mg/dL — ABNORMAL LOW (ref 8.9–10.3)
Chloride: 84 mmol/L — ABNORMAL LOW (ref 98–111)
Creatinine, Ser: 0.54 mg/dL (ref 0.44–1.00)
GFR calc Af Amer: >60 mL/min (ref >60)
GFR calc non Af Amer: >60 mL/min (ref >60)
Glucose, Bld: 70 mg/dL (ref 70–99)
Potassium: 3 mmol/L — ABNORMAL LOW (ref 3.5–5.1)
Sodium: 135 mmol/L (ref 135–145)

## 2020-03-12 LAB — LEGIONELLA PNEUMOPHILA SEROGP 1 UR AG: L. pneumophila Serogp 1 Ur Ag: NEGATIVE

## 2020-03-12 LAB — MAGNESIUM: Magnesium: 1.7 mg/dL (ref 1.7–2.4)

## 2020-03-12 LAB — BRAIN NATRIURETIC PEPTIDE: B Natriuretic Peptide: 225 pg/mL — ABNORMAL HIGH (ref 0.0–100.0)

## 2020-03-12 MED ORDER — HEPARIN BOLUS VIA INFUSION
4000.0000 [IU] | Freq: Once | INTRAVENOUS | Status: AC
  Administered 2020-03-12: 4000 [IU] via INTRAVENOUS
  Filled 2020-03-12: qty 4000

## 2020-03-12 MED ORDER — POTASSIUM CHLORIDE 10 MEQ/100ML IV SOLN
10.0000 meq | INTRAVENOUS | Status: AC
  Administered 2020-03-12 – 2020-03-13 (×5): 10 meq via INTRAVENOUS
  Filled 2020-03-12 (×5): qty 100

## 2020-03-12 MED ORDER — POTASSIUM CHLORIDE 10 MEQ/100ML IV SOLN
10.0000 meq | INTRAVENOUS | Status: DC
  Administered 2020-03-12 (×3): 10 meq via INTRAVENOUS
  Filled 2020-03-12 (×3): qty 100

## 2020-03-12 MED ORDER — IPRATROPIUM-ALBUTEROL 0.5-2.5 (3) MG/3ML IN SOLN
3.0000 mL | Freq: Three times a day (TID) | RESPIRATORY_TRACT | Status: DC
  Administered 2020-03-12 – 2020-03-17 (×16): 3 mL via RESPIRATORY_TRACT
  Filled 2020-03-12 (×15): qty 3

## 2020-03-12 MED ORDER — HEPARIN (PORCINE) 25000 UT/250ML-% IV SOLN
1050.0000 [IU]/h | INTRAVENOUS | Status: DC
  Administered 2020-03-12: 1000 [IU]/h via INTRAVENOUS
  Administered 2020-03-13: 1150 [IU]/h via INTRAVENOUS
  Administered 2020-03-14: 1300 [IU]/h via INTRAVENOUS
  Administered 2020-03-15: 1150 [IU]/h via INTRAVENOUS
  Administered 2020-03-16: 1050 [IU]/h via INTRAVENOUS
  Filled 2020-03-12 (×5): qty 250

## 2020-03-12 MED ORDER — SODIUM CHLORIDE 0.9 % IV SOLN
INTRAVENOUS | Status: DC | PRN
  Administered 2020-03-12 (×2): 30 mL via INTRAVENOUS
  Administered 2020-03-13: 500 mL via INTRAVENOUS
  Administered 2020-03-14: 250 mL via INTRAVENOUS
  Administered 2020-03-14: 500 mL via INTRAVENOUS
  Administered 2020-03-15 – 2020-03-20 (×6): 250 mL via INTRAVENOUS

## 2020-03-12 MED ORDER — FUROSEMIDE 10 MG/ML IJ SOLN
40.0000 mg | Freq: Once | INTRAMUSCULAR | Status: AC
  Administered 2020-03-12: 40 mg via INTRAVENOUS
  Filled 2020-03-12: qty 4

## 2020-03-12 MED ORDER — DILTIAZEM HCL-DEXTROSE 125-5 MG/125ML-% IV SOLN (PREMIX)
5.0000 mg/h | INTRAVENOUS | Status: DC
  Administered 2020-03-13: 5 mg/h via INTRAVENOUS
  Administered 2020-03-13 (×2): 10 mg/h via INTRAVENOUS
  Administered 2020-03-13: 7.5 mg/h via INTRAVENOUS
  Administered 2020-03-14 – 2020-03-17 (×6): 10 mg/h via INTRAVENOUS
  Filled 2020-03-12 (×8): qty 125

## 2020-03-12 MED ORDER — FUROSEMIDE 10 MG/ML IJ SOLN
40.0000 mg | Freq: Two times a day (BID) | INTRAMUSCULAR | Status: DC
  Administered 2020-03-12 – 2020-03-13 (×3): 40 mg via INTRAVENOUS
  Filled 2020-03-12 (×3): qty 4

## 2020-03-12 NOTE — Progress Notes (Signed)
Patient refused PO medications today. At 1520 patient's respirations were 30 with spO2 98% on 3L nasal cannula. MD was notified and came to floor to assess patient. At 1800 patient's heart rate was 120 bpm. IV metoprolol was given, MD notified, and vitals was monitored every 15 minutes per MEWS protocol. Cardizem drip ordered by MD and patient to transfer to 2A.  03/12/2020 8:29 PM   Marry Guan, RN

## 2020-03-12 NOTE — Progress Notes (Signed)
PT Cancellation Note  Patient Details Name: Brandy Armstrong MRN: [id number] DOB: May 09, 1946   Cancelled Treatment:    Reason Eval/Treat Not Completed: Patient's level of consciousness. Patient eval is attempted but patient is not able to follow 1 step commands to assess strength or mobility. Her HR was 103 and O2 saturation wa greater than 95%. Patient eval will be re-attempted tomorrow.   [address] [place] DPT 03/12/2020, 4:45 PM

## 2020-03-12 NOTE — Consult Note (Signed)
[place] for a heparin infusion Indication: VTE prophylaxis  Patient Measurements: Height: 5\' 6"  (167.6 cm) Weight: 194 lb 3.6 oz (88.1 kg) IBW/kg (Calculated) : 59.3 Heparin Dosing Weight: 76.4 kg  Vital Signs: Temp: 98.5 F (36.9 C) (03/14 1245) Temp Source: Axillary (03/14 1245) BP: 149/98 (03/14 1245) Pulse Rate: 98 (03/14 1245)  Labs: Recent Labs    03/09/20 1702 03/09/20 1702 03/09/20 1817 03/09/20 2130 03/10/20 0414 03/10/20 0414 03/11/20 0421 03/12/20 0714  HGB 9.4*   < >  --   --  9.2*   < > 9.5* 10.0*  HCT 34.0*   < >  --   --  32.4*  --  33.7* 34.7*  PLT 376   < >  --   --  272  --  277 251  CREATININE  --    < >   < >  --  0.51  --  0.52 0.50  TROPONINIHS 7  --   --  13  --   --   --   --    < > = values in this interval not displayed.    Estimated Creatinine Clearance: 72.1 mL/min (by C-G formula based on SCr of 0.5 mg/dL).   Medical History: Past Medical History:  Diagnosis Date  . A-fib ([place])   . Barretts esophagus   . Chest pain    a. 02/2014 Myoview: Ef 50%, no ischemia.  . Colon polyps   . COPD (chronic obstructive pulmonary disease) ([place])   . Depression with anxiety   . GERD (gastroesophageal reflux disease)   . Hx of benign essential tremor   . Neuropathy   . Permanent atrial fibrillation [facility])    a. s/p ablation 01/2012 in [facility] by Dr. Boyd Kerbs;  b. On sotalol & Xarelto;  c. 02/2014 Echo: EF 50-55%, mild conc LVH, nl LA size/structure;  d. Recurrent afib 8/15 & 08/29/2014.  [name] Rectal fistula   . Rheumatoid arthritis ([place])    On methotrexate and orencia  . Urinary incontinence   . Vitamin D deficiency     Medications:  Scheduled:  . busPIRone  10 mg Oral TID  . Chlorhexidine Gluconate Cloth  6 each Topical Daily  . diltiazem  60 mg Oral Q6H  . feeding supplement (ENSURE ENLIVE)  237 mL Oral BID BM  . furosemide  40 mg Intravenous Once  . guaiFENesin  600 mg Oral BID  . hydrochlorothiazide   12.5 mg Oral Daily  . hyoscyamine  0.125 mg Oral TID AC  . ipratropium-albuterol  3 mL Nebulization TID  . lidocaine (PF)  30 mL Infiltration Once  . lipase/protease/amylase  36,000 Units Oral TID WC  . pantoprazole  40 mg Oral Daily  . propranolol ER  120 mg Oral Daily  . sertraline  50 mg Oral Daily  . traZODone  50 mg Oral QHS    Assessment: 74 y.o. femalewith a  history of  COPD, atrial fibrillation on Eliquis. Her last dose of Eliquis was 03/10/20 at 2334. She is being transitioned to a heparin drip in preparation for a possible chest tube placement. She was a patient at [facility] approximately one year ago where she received IV heparin. Data from that admission was used to guide dosing below.  Goal of Therapy:  Heparin level 0.3-0.7 units/ml aPTT 66 - 102 seconds Monitor platelets by anticoagulation protocol: Yes   Plan:   Give 4000 units bolus x 1  Start heparin infusion at 1000  units/hr  Check anti-Xa level and aPTT in 8 hours and daily while on heparin: aPTT will be used to guide therapy until the two levels correlate  Continue to monitor H&H and platelets  Dallie Piles 03/12/2020,2:10 PM

## 2020-03-12 NOTE — Progress Notes (Addendum)
PROGRESS NOTE    Brandy Armstrong  WCB:[id number] DOB: July 12, 1946 DOA: 03/09/2020 PCP: [name], FNP   Brief Narrative:  Brandy Armstrong  is Brandy Armstrong 74 y.o. Caucasian female with Cashmere Harmes known history of multiple medical problems that are mentioned below including COPD, atrial fibrillation, on Eliquis, peripheral neuropathy and rheumatoid arthritis, who was recently admitted for MSSA bacteremia of unclear etiology for which she continues to take IV antibiotic therapy through PICC line at [facility] where she resides.  She presents to the emergency room with acute onset of worsening dyspnea with hypoxemia to 85% on room air with associated cough occasionally productive of yellowish sputum without fever or chills.  She denied any nausea or vomiting or abdominal pain.  No worsening lower extremity edema..  She is dyspneic in all positions. The patient underwent ultrasound-guided right thoracentesis in the ER with drainage of 1 L of pleural fluid.  Upon presentation to the emergency room, blood pressure was 140/98 and pulse oximetry was up to 90% on 6 L O2 by nasal cannula.  Temperature was 98.1 and heart rate was 103.  Respiratory rate was 20 and later 22 and heart rate later on was up to 112.  Labs revealed anemia with hemoglobin of 9.4 hematocrit 34 comparable to previous levels on 2/21 with otherwise unremarkable CMP.  BNP was 573 compared to 519 on 2/23 and high-sensitivity troponin I was 7.  Chest x-ray initially showed whiteout right lung.  Pleural fluid analysis showed 69 lymphocytes and 12 neutrophils with total protein of 3.8 with LDH of 144. EKG showed atrial fibrillation with slight rapid ventricular response of 108 with Q waves inferiorly and T wave version laterally as well as prolonged QT interval.  The patient's oxygen requirement currently improved to 3.5 L O2 with pulse ox at 95 to 97% after right thoracentesis.  The patient was given nebulized albuterol as well as  duonebs twice, IV cefepime vancomycin and Flagyl.  She will be admitted to [facility] monitored bed for further evaluation and management.  Assessment & Plan:   Active Problems:   Pleural effusion  Exudative Right Pleural Effusion  Acute Hypoxic Respiratory Failure:  Currently requiring 3 L [place] S/p thoracentesis in ED with 1 L serosanguinous fluid removed on 3/11 3/12 with thoracentesis by IR with 1.2 L bloody pleural fluid -> c/b R basilar ex vacuo hydropneumothorax Repeat CXR 3/12 with interval reduction of R sided pleural effusion -> tiny R basilar ex vacuo hydropneumothorax.  Improved aeration of the R mid and lower lung with persistent R medial basilar heterogeneous/consolidative opacities (atelectasis vs infiltrate) CXR 3/13 with mild interval worsening in lung aeration on the R consistent with increase in R pleural fluid and atelectasis CXR 3/14 with moderate layering R pleural effusion with R lower lobe atelectasis Exudative by lights Follow cytology (pending) and culture (NG) Pulm c/s, appreciate recs -> recommended quant gold, isolation, if reaccumulation of fluid, may need chest tube (follow serial CXR), consider CT surgery, hold plaquenil and adalimumab Cefepime Blood cx NGTD Body fluid cx with NG Elevated BNP, diurese as tolerated  2.  Recent MSSA bacteremia. -We will continue IV Ancef that she supposed to take till 03/26/2020. (switch to cefepime for now to cover for pneumonia) -The patient has finished Amareon Phung course of IV Invanz on 03/08/2020 (will need to review this, I don't see this clearly? Family member did acknowledge she was treated with abx at [facility])  # Delirium: persistently confused.  Likely 2/2 hypoxia  and hospitalization.  Per family, she's been declining from mental status standpoint (and in general) over the past years and has been confused when hospitalized before.  3.  COPD. -She does not seem to have any current acute flare. -She will be placed on duo nebs as  mentioned above.  4.  Rheumatoid arthritis. -We will hold Plaquenil given prolonged QT interval. - Repeat EKG -> improved qtc  5.  Hypertension. -We will continue HCTZ and Cardizem CD.  6.  Atrial fibrillation with mildly rapid ventricular response, likely secondary to #1. - Hold eliquis in preparation for procedure.  Short acting cardizem.  Adjust dose as needed.  7.  Anxiety. -We will continue BuSpar.  8.  DVT prophylaxis. -eliquis on hold  DVT prophylaxis: SCD, hold eliquis in case of procedure - heparin Code Status: full  Family Communication: [name] 3/14 Disposition Plan:  . Patient came from: SNF            . Anticipated d/c place: SNF . Barriers to d/c OR conditions which need to be met to effect Thania Woodlief safe d/c: pending further eval and management of effusion and SOB   Consultants:   IR  Pulm  Procedures:   Thoracentesis 3/11 and 3/12  Antimicrobials:  Anti-infectives (From admission, onward)   Start     Dose/Rate Route Frequency Ordered Stop   03/10/20 1800  ceFEPIme (MAXIPIME) 2 g in sodium chloride 0.9 % 100 mL IVPB     2 g 200 mL/hr over 30 Minutes Intravenous Every 8 hours 03/10/20 1529     03/10/20 1600  piperacillin-tazobactam (ZOSYN) IVPB 3.375 g  Status:  Discontinued     3.375 g 12.5 mL/hr over 240 Minutes Intravenous Every 8 hours 03/10/20 1519 03/10/20 1529   03/10/20 1000  ceFAZolin (ANCEF) IVPB 2g/100 mL premix  Status:  Discontinued     2 g 200 mL/hr over 30 Minutes Intravenous Every 8 hours 03/10/20 0624 03/10/20 1519   03/09/20 2200  ceFAZolin (ANCEF) IVPB  Status:  Discontinued    Note to Pharmacy: Indication:  MSSA bacteremia Last Day of Therapy:  03/26/2020 Labs - Once weekly:  CBC/D, CMP, CRP Please pull PIC at completion of IV antibiotics     2 g Intravenous Every 8 hours 03/09/20 2037 03/09/20 2144   03/09/20 2200  hydroxychloroquine (PLAQUENIL) tablet 200 mg  Status:  Discontinued     200 mg Oral 2 times daily 03/09/20 2037  03/10/20 0015   03/09/20 2200  ceFAZolin (ANCEF) IVPB 2g/100 mL premix  Status:  Discontinued     2 g 200 mL/hr over 30 Minutes Intravenous Every 8 hours 03/09/20 2144 03/10/20 0624   03/09/20 2015  ceFEPIme (MAXIPIME) 1 g in sodium chloride 0.9 % 100 mL IVPB  Status:  Discontinued     1 g 200 mL/hr over 30 Minutes Intravenous Once 03/09/20 2008 03/09/20 2144   03/09/20 2015  vancomycin (VANCOCIN) IVPB 1000 mg/200 mL premix  Status:  Discontinued     1,000 mg 200 mL/hr over 60 Minutes Intravenous  Once 03/09/20 2008 03/10/20 0117   03/09/20 2015  metroNIDAZOLE (FLAGYL) IVPB 500 mg  Status:  Discontinued     500 mg 100 mL/hr over 60 Minutes Intravenous  Once 03/09/20 2008 03/10/20 0117     Subjective: Disoriented No complaints, no pain  Objective: Vitals:   03/12/20 0747 03/12/20 0819 03/12/20 1235 03/12/20 1245  BP:  138/85 (!) 149/98 (!) 149/98  Pulse:  77 (!) 101 98  Resp:  (!)  22  (!) 26  Temp:  97.9 F (36.6 C) 98.5 F (36.9 C) 98.5 F (36.9 C)  TempSrc:  Axillary Axillary Axillary  SpO2: 95% 97%  97%  Weight:      Height:        Intake/Output Summary (Last 24 hours) at 03/12/2020 1345 Last data filed at 03/12/2020 0450 Gross per 24 hour  Intake 352.91 ml  Output 3100 ml  Net -2747.09 ml   Filed Weights   03/09/20 1710 03/09/20 2351  Weight: 81.6 kg 88.1 kg    Examination:  General: No acute distress. Cardiovascular: Heart sounds show Delight Bickle regular rate, and rhythm. Lungs: diminished breath sounds Abdomen: Soft, nontender, nondistended  Neurological: Alert and disoriented. Moves all extremities 4. Cranial nerves II through XII grossly intact. Skin: Warm and dry. No rashes or lesions. Extremities: No clubbing or cyanosis. No edema.   Data Reviewed: I have personally reviewed following labs and imaging studies  CBC: Recent Labs  Lab 03/09/20 1702 03/10/20 0414 03/11/20 0421 03/12/20 0714  WBC 9.3 7.8 6.9 5.5  NEUTROABS 7.5  --  5.0 4.1  HGB 9.4* 9.2*  9.5* 10.0*  HCT 34.0* 32.4* 33.7* 34.7*  MCV 87.6 85.7 85.1 84.4  PLT 376 272 277 812   Basic Metabolic Panel: Recent Labs  Lab 03/09/20 1817 03/10/20 0414 03/11/20 0421 03/12/20 0714  NA 131* 133* 134* 135  K 4.5 4.3 3.9 3.1*  CL 94* 95* 95* 87*  CO2 '29 28 30 '$ 36*  GLUCOSE 137* 127* 84 82  BUN 24* '22 18 14  '$ CREATININE 0.59 0.51 0.52 0.50  CALCIUM 8.9 8.7* 8.6* 8.5*  MG  --   --  1.8 1.7  PHOS  --   --  2.6 2.5   GFR: Estimated Creatinine Clearance: 72.1 mL/min (by C-G formula based on SCr of 0.5 mg/dL). Liver Function Tests: Recent Labs  Lab 03/09/20 1817 03/11/20 0421 03/12/20 0714  AST '15 16 19  '$ ALT <5 <5 <5  ALKPHOS 101 80 92  BILITOT 0.6 0.6 1.0  PROT 7.4 6.3* 6.5  ALBUMIN 2.6* 2.3* 2.4*   No results for input(s): LIPASE, AMYLASE in the last 168 hours. No results for input(s): AMMONIA in the last 168 hours. Coagulation Profile: No results for input(s): INR, PROTIME in the last 168 hours. Cardiac Enzymes: No results for input(s): CKTOTAL, CKMB, CKMBINDEX, TROPONINI in the last 168 hours. BNP (last 3 results) No results for input(s): PROBNP in the last 8760 hours. HbA1C: No results for input(s): HGBA1C in the last 72 hours. CBG: No results for input(s): GLUCAP in the last 168 hours. Lipid Profile: No results for input(s): CHOL, HDL, LDLCALC, TRIG, CHOLHDL, LDLDIRECT in the last 72 hours. Thyroid Function Tests: No results for input(s): TSH, T4TOTAL, FREET4, T3FREE, THYROIDAB in the last 72 hours. Anemia Panel: No results for input(s): VITAMINB12, FOLATE, FERRITIN, TIBC, IRON, RETICCTPCT in the last 72 hours. Sepsis Labs: No results for input(s): PROCALCITON, LATICACIDVEN in the last 168 hours.  Recent Results (from the past 240 hour(s))  Body fluid culture (includes gram stain)     Status: None (Preliminary result)   Collection Time: 03/09/20  6:52 PM   Specimen: Pleural Fluid  Result Value Ref Range Status   Specimen Description   Final     PLEURAL Performed at [facility], [address], [place], [place] [address]    Special Requests   Final    PLEURAL Performed at [facility], [address] [place], [place]  [address]    Gram Stain   Final    FEW WBC PRESENT, PREDOMINANTLY MONONUCLEAR NO ORGANISMS SEEN    Culture   Final    NO GROWTH 3 DAYS Performed at [facility] [address], [place], [place] [address]    Report Status PENDING  Incomplete  Respiratory Panel by RT PCR (Flu Lilybelle Mayeda&B, Covid) - Nasopharyngeal Swab     Status: None   Collection Time: 03/09/20  8:37 PM   Specimen: Nasopharyngeal Swab  Result Value Ref Range Status   SARS Coronavirus 2 by RT PCR NEGATIVE NEGATIVE Final    Comment: (NOTE) SARS-CoV-2 target nucleic acids are NOT DETECTED. The SARS-CoV-2 RNA is generally detectable in upper respiratoy specimens during the acute phase of infection. The lowest concentration of SARS-CoV-2 viral copies this assay can detect is 131 copies/mL. Ardean Simonich negative result does not preclude SARS-Cov-2 infection and should not be used as the sole basis for treatment or other patient management decisions. Elenna Spratling negative result may occur with  improper specimen collection/handling, submission of specimen other than nasopharyngeal swab, presence of viral mutation(s) within the areas targeted by this assay, and inadequate number of viral copies (<131 copies/mL). Samaira Holzworth negative result must be combined with clinical observations, patient history, and epidemiological information. The expected result is Negative. Fact Sheet for Patients:  PinkCheek.be Fact Sheet for Healthcare Providers:  GravelBags.it This test is not yet ap proved or cleared by the [place] FDA and  has been authorized for detection and/or diagnosis of SARS-CoV-2 by FDA under an Emergency Use Authorization (EUA). This EUA will remain  in effect (meaning this test can be used)  for the duration of the COVID-19 declaration under Section 564(b)(1) of the Act, 21 U.S.C. section 360bbb-3(b)(1), unless the authorization is terminated or revoked sooner.    Influenza Chauntay Paszkiewicz by PCR NEGATIVE NEGATIVE Final   Influenza B by PCR NEGATIVE NEGATIVE Final    Comment: (NOTE) The Xpert Xpress SARS-CoV-2/FLU/RSV assay is intended as an aid in  the diagnosis of influenza from Nasopharyngeal swab specimens and  should not be used as Domnick Chervenak sole basis for treatment. Nasal washings and  aspirates are unacceptable for Xpert Xpress SARS-CoV-2/FLU/RSV  testing. Fact Sheet for Patients: PinkCheek.be Fact Sheet for Healthcare Providers: GravelBags.it This test is not yet approved or cleared by the [place] FDA and  has been authorized for detection and/or diagnosis of SARS-CoV-2 by  FDA under an Emergency Use Authorization (EUA). This EUA will remain  in effect (meaning this test can be used) for the duration of the  Covid-19 declaration under Section 564(b)(1) of the Act, 21  U.S.C. section 360bbb-3(b)(1), unless the authorization is  terminated or revoked. Performed at [facility], [address] [place], [place] [address]   Culture, blood (routine x 2)     Status: None (Preliminary result)   Collection Time: 03/09/20  9:31 PM   Specimen: BLOOD  Result Value Ref Range Status   Specimen Description BLOOD LEFT ASSIST CONTROL  Final   Special Requests   Final    BOTTLES DRAWN AEROBIC AND ANAEROBIC Blood Culture adequate volume   Culture   Final    NO GROWTH 3 DAYS Performed at [facility], [address] [place], [place] [address]    Report Status PENDING  Incomplete  Culture, blood (routine x 2)     Status: None (Preliminary result)   Collection Time: 03/09/20  9:45 PM   Specimen: BLOOD  Result Value Ref Range  Status   Specimen Description BLOOD RIGHT ASSIST CONTROL  Final   Special Requests    Final    BOTTLES DRAWN AEROBIC AND ANAEROBIC Blood Culture adequate volume   Culture   Final    NO GROWTH 3 DAYS Performed at [facility], [address], [place], [place] [address]    Report Status PENDING  Incomplete  MRSA PCR Screening     Status: None   Collection Time: 03/10/20  3:58 PM   Specimen: Nasopharyngeal  Result Value Ref Range Status   MRSA by PCR NEGATIVE NEGATIVE Final    Comment:        The GeneXpert MRSA Assay (FDA approved for NASAL specimens only), is one component of [place] comprehensive MRSA colonization surveillance program. It is not intended to diagnose MRSA infection nor to guide or monitor treatment for MRSA infections. Performed at [facility], [address] [place], [place] [address]          Radiology Studies: [facility] Chest [place] 1 View  Result Date: 03/12/2020 CLINICAL DATA:  Hypoxia, shortness of breath EXAM: PORTABLE CHEST 1 VIEW COMPARISON:  03/11/2020 FINDINGS: Moderate layering right pleural effusion. Associated right lower lobe opacity, likely atelectasis. Left lung is clear. No pneumothorax. Overall, this appearance is unchanged. Cardiomegaly. Right arm PICC terminates the cavoatrial junction. IMPRESSION: Moderate layering right pleural effusion with right lower lobe atelectasis, unchanged. Electronically Signed   By: Julian Hy M.D.   On: 03/12/2020 09:49   DG Chest Port 1 View  Result Date: 03/11/2020 CLINICAL DATA:  Status post right-sided thoracentesis yesterday. Increased work of breathing today. EXAM: PORTABLE CHEST 1 VIEW COMPARISON:  03/10/2020 FINDINGS: There is increased hazy opacity in the right mid and lower lung, with more obscuration of the right hemidiaphragm, findings consistent with an increase in right pleural fluid and. No visible pneumothorax. Clear left lung.  No left pleural effusion. Right PICC is stable. IMPRESSION: 1. Mild interval worsening in lung aeration on the right consistent with an  increase in right pleural fluid and atelectasis. 2. No visible pneumothorax. Electronically Signed   By: Lajean Manes M.D.   On: 03/11/2020 09:07   DG Chest Port 1 View  Result Date: 03/10/2020 CLINICAL DATA:  Post right-sided thoracentesis EXAM: PORTABLE CHEST 1 VIEW COMPARISON:  03/09/2020; chest CT-03/09/2020 FINDINGS: Grossly unchanged enlarged cardiac silhouette and mediastinal contours with persistent partial obscuration the right heart border secondary to right medial basilar heterogeneous/consolidative opacities. Interval reduction/near resolution of right-sided pleural effusion post thoracentesis with improved aeration of the right mid and lower lung. Interval development of Letcher Schweikert tiny loculated right basilar ex vacuo hydropneumothorax. No mediastinal shift. The left hemithorax remains well aerated. No new focal airspace opacities. Mild pulmonary congestion without frank evidence of edema. Stable position of support apparatus. No acute osseous abnormalities. Post cholecystectomy. IMPRESSION: 1. Interval reduction/near resolution of right-sided pleural effusion post thoracentesis with development of tiny right basilar ex vacuo hydropneumothorax. 2. Improved aeration of the right mid and lower lung with persistent right medial basilar heterogeneous/consolidative opacities, atelectasis versus infiltrate (favored). Electronically Signed   By: Sandi Mariscal M.D.   On: 03/10/2020 14:12   US THORACENTESIS ASP PLEURAL SPACE W/IMG GUIDE  Result Date: 03/10/2020 INDICATION: Symptomatic right-sided pleural effusion. Please from ultrasound-guided thoracentesis for diagnostic purposes. EXAM: US THORACENTESIS ASP PLEURAL SPACE W/IMG GUIDE COMPARISON:  Chest CT - 03/09/2020; chest radiograph - 03/10/2019 MEDICATIONS: None. COMPLICATIONS: SIR Level Ahmiyah Coil - No therapy, no consequence. Procedure complicated by development of Ahaan Zobrist tiny  right basilar ex vacuo hydropneumothorax. TECHNIQUE: Informed written consent was obtained from  the patient after [place] discussion of the risks, benefits and alternatives to treatment. Canary Fister timeout was performed prior to the initiation of the procedure. Initial ultrasound scanning demonstrates Shahira Fiske moderate to large sized residual anechoic right-sided pleural effusion. The skin overlying the inferolateral lower chest was prepped and draped in the usual sterile fashion. 1% lidocaine was used for local anesthesia. An ultrasound image was saved for documentation purposes. An 8 Fr Safe-T-Centesis catheter was introduced. The thoracentesis was performed. The catheter was removed and Brahim Dolman dressing was applied. The patient tolerated the procedure well without immediate post procedural complication. The patient was escorted to have an upright chest radiograph. FINDINGS: Djuna Frechette total of approximately 1.2 liters of bloody pleural fluid was removed. Requested samples were sent to the laboratory. IMPRESSION: Successful ultrasound-guided right sided thoracentesis yielding 1.2 liters of bloody pleural fluid. Electronically Signed   By: Sandi Mariscal M.D.   On: 03/10/2020 14:23        Scheduled Meds: . busPIRone  10 mg Oral TID  . Chlorhexidine Gluconate Cloth  6 each Topical Daily  . diltiazem  60 mg Oral Q6H  . feeding supplement (ENSURE ENLIVE)  237 mL Oral BID BM  . furosemide  40 mg Intravenous Once  . guaiFENesin  600 mg Oral BID  . hydrochlorothiazide  12.5 mg Oral Daily  . hyoscyamine  0.125 mg Oral TID AC  . ipratropium-albuterol  3 mL Nebulization TID  . lidocaine (PF)  30 mL Infiltration Once  . lipase/protease/amylase  36,000 Units Oral TID WC  . pantoprazole  40 mg Oral Daily  . propranolol ER  120 mg Oral Daily  . sertraline  50 mg Oral Daily  . traZODone  50 mg Oral QHS   Continuous Infusions: . ceFEPime (MAXIPIME) IV 2 g (03/12/20 0646)     LOS: 3 days    Time spent: over 30 min    Fayrene Helper, MD [facility]   To contact the attending provider between 7A-7P or the covering  provider during after hours 7P-7A, please log into the web site [email or web address] and access using universal [place] password for that web site. If you do not have the password, please call the hospital operator.  03/12/2020, 1:45 PM

## 2020-03-12 NOTE — Plan of Care (Signed)
Patient is alert to self only but took her medications crushed in applesauce without difficulty. Respirations are improving and heart rate is under 100 after cardizem was given.  Patient had an output of 2400 mL after iv lasix was given. Will continue to monitor progress.  Brandy Armstrong  03/12/2020  3:34 AM

## 2020-03-13 ENCOUNTER — Inpatient Hospital Stay: Payer: Medicare Other

## 2020-03-13 LAB — APTT
aPTT: 36 seconds (ref 24–36)
aPTT: 50 seconds — ABNORMAL HIGH (ref 24–36)
aPTT: 85 seconds — ABNORMAL HIGH (ref 24–36)

## 2020-03-13 LAB — COMPREHENSIVE METABOLIC PANEL
ALT: 6 U/L (ref 0–44)
AST: 19 U/L (ref 15–41)
Albumin: 2.6 g/dL — ABNORMAL LOW (ref 3.5–5.0)
Alkaline Phosphatase: 96 U/L (ref 38–126)
Anion gap: 14 (ref 5–15)
BUN: 16 mg/dL (ref 8–23)
CO2: 36 mmol/L — ABNORMAL HIGH (ref 22–32)
Calcium: 8.5 mg/dL — ABNORMAL LOW (ref 8.9–10.3)
Chloride: 79 mmol/L — ABNORMAL LOW (ref 98–111)
Creatinine, Ser: 0.62 mg/dL (ref 0.44–1.00)
GFR calc Af Amer: >60 mL/min (ref >60)
GFR calc non Af Amer: >60 mL/min (ref >60)
Glucose, Bld: 411 mg/dL — ABNORMAL HIGH (ref 70–99)
Potassium: 3 mmol/L — ABNORMAL LOW (ref 3.5–5.1)
Sodium: 129 mmol/L — ABNORMAL LOW (ref 135–145)
Total Bilirubin: 1.5 mg/dL — ABNORMAL HIGH (ref 0.3–1.2)
Total Protein: 7.2 g/dL (ref 6.5–8.1)

## 2020-03-13 LAB — BASIC METABOLIC PANEL
Anion gap: 12 (ref 5–15)
BUN: 17 mg/dL (ref 8–23)
CO2: 41 mmol/L — ABNORMAL HIGH (ref 22–32)
Calcium: 8.6 mg/dL — ABNORMAL LOW (ref 8.9–10.3)
Chloride: 80 mmol/L — ABNORMAL LOW (ref 98–111)
Creatinine, Ser: 0.6 mg/dL (ref 0.44–1.00)
GFR calc Af Amer: >60 mL/min (ref >60)
GFR calc non Af Amer: >60 mL/min (ref >60)
Glucose, Bld: 162 mg/dL — ABNORMAL HIGH (ref 70–99)
Potassium: 2.9 mmol/L — ABNORMAL LOW (ref 3.5–5.1)
Sodium: 133 mmol/L — ABNORMAL LOW (ref 135–145)

## 2020-03-13 LAB — FOLATE: Folate: 6.4 ng/mL (ref >5.9)

## 2020-03-13 LAB — CBC WITH DIFFERENTIAL/PLATELET
Abs Immature Granulocytes: 0.02 10*3/uL (ref 0.00–0.07)
Basophils Absolute: 0 10*3/uL (ref 0.0–0.1)
Basophils Relative: 0 %
Eosinophils Absolute: 0.2 10*3/uL (ref 0.0–0.5)
Eosinophils Relative: 2 %
HCT: 35.8 % — ABNORMAL LOW (ref 36.0–46.0)
Hemoglobin: 10.6 g/dL — ABNORMAL LOW (ref 12.0–15.0)
Immature Granulocytes: 0 %
Lymphocytes Relative: 13 %
Lymphs Abs: 0.9 10*3/uL (ref 0.7–4.0)
MCH: 24.4 pg — ABNORMAL LOW (ref 26.0–34.0)
MCHC: 29.6 g/dL — ABNORMAL LOW (ref 30.0–36.0)
MCV: 82.3 fL (ref 80.0–100.0)
Monocytes Absolute: 0.7 10*3/uL (ref 0.1–1.0)
Monocytes Relative: 11 %
Neutro Abs: 5 10*3/uL (ref 1.7–7.7)
Neutrophils Relative %: 74 %
Platelets: 268 10*3/uL (ref 150–400)
RBC: 4.35 MIL/uL (ref 3.87–5.11)
RDW: 19.4 % — ABNORMAL HIGH (ref 11.5–15.5)
WBC: 6.8 10*3/uL (ref 4.0–10.5)
nRBC: 0 % (ref 0.0–0.2)

## 2020-03-13 LAB — PH, BODY FLUID: pH, Body Fluid: 7.3

## 2020-03-13 LAB — BODY FLUID CULTURE: Culture: NO GROWTH

## 2020-03-13 LAB — GLUCOSE, CAPILLARY
Glucose-Capillary: 106 mg/dL — ABNORMAL HIGH (ref 70–99)
Glucose-Capillary: 134 mg/dL — ABNORMAL HIGH (ref 70–99)
Glucose-Capillary: 59 mg/dL — ABNORMAL LOW (ref 70–99)
Glucose-Capillary: 81 mg/dL (ref 70–99)
Glucose-Capillary: 88 mg/dL (ref 70–99)
Glucose-Capillary: 93 mg/dL (ref 70–99)
Glucose-Capillary: 95 mg/dL (ref 70–99)
Glucose-Capillary: 96 mg/dL (ref 70–99)

## 2020-03-13 LAB — MAGNESIUM
Magnesium: 1.5 mg/dL — ABNORMAL LOW (ref 1.7–2.4)
Magnesium: 1.8 mg/dL (ref 1.7–2.4)

## 2020-03-13 LAB — PHOSPHORUS: Phosphorus: 2.2 mg/dL — ABNORMAL LOW (ref 2.5–4.6)

## 2020-03-13 LAB — BRAIN NATRIURETIC PEPTIDE: B Natriuretic Peptide: 113 pg/mL — ABNORMAL HIGH (ref 0.0–100.0)

## 2020-03-13 LAB — HEPARIN LEVEL (UNFRACTIONATED): Heparin Unfractionated: 1.27 IU/mL — ABNORMAL HIGH (ref 0.30–0.70)

## 2020-03-13 LAB — VITAMIN B12: Vitamin B-12: 519 pg/mL (ref 180–914)

## 2020-03-13 LAB — TSH: TSH: 3.722 u[IU]/mL (ref 0.350–4.500)

## 2020-03-13 MED ORDER — K PHOS MONO-SOD PHOS DI & MONO 155-852-130 MG PO TABS
500.0000 mg | ORAL_TABLET | ORAL | Status: AC
  Filled 2020-03-13 (×2): qty 2

## 2020-03-13 MED ORDER — SODIUM CHLORIDE 4 MEQ/ML IV SOLN
INTRAVENOUS | Status: DC
  Filled 2020-03-13 (×4): qty 1000

## 2020-03-13 MED ORDER — MAGNESIUM OXIDE 400 (241.3 MG) MG PO TABS
200.0000 mg | ORAL_TABLET | Freq: Two times a day (BID) | ORAL | Status: DC
  Administered 2020-03-15 – 2020-03-20 (×9): 200 mg via ORAL
  Filled 2020-03-13 (×9): qty 1

## 2020-03-13 MED ORDER — MAGNESIUM SULFATE 2 GM/50ML IV SOLN
2.0000 g | Freq: Once | INTRAVENOUS | Status: AC
  Administered 2020-03-13: 2 g via INTRAVENOUS
  Filled 2020-03-13: qty 50

## 2020-03-13 MED ORDER — SODIUM CHLORIDE 0.9% FLUSH
10.0000 mL | Freq: Two times a day (BID) | INTRAVENOUS | Status: DC
  Administered 2020-03-13 – 2020-03-21 (×15): 10 mL

## 2020-03-13 MED ORDER — POTASSIUM CHLORIDE 10 MEQ/100ML IV SOLN
10.0000 meq | INTRAVENOUS | Status: AC
  Administered 2020-03-13 (×6): 10 meq via INTRAVENOUS
  Filled 2020-03-13 (×6): qty 100

## 2020-03-13 MED ORDER — DEXTROSE 10 % IV SOLN: INTRAVENOUS | Status: DC

## 2020-03-13 MED ORDER — DEXTROSE 50 % IV SOLN
12.5000 g | Freq: Once | INTRAVENOUS | Status: AC
  Administered 2020-03-13: 12.5 g via INTRAVENOUS

## 2020-03-13 MED ORDER — FUROSEMIDE 10 MG/ML IJ SOLN: 40.0000 mg | Freq: Every day | INTRAMUSCULAR | Status: DC

## 2020-03-13 MED ORDER — DEXTROSE 50 % IV SOLN
INTRAVENOUS | Status: AC
  Filled 2020-03-13: qty 50

## 2020-03-13 MED ORDER — SODIUM CHLORIDE 0.9% FLUSH
10.0000 mL | INTRAVENOUS | Status: DC | PRN
  Administered 2020-03-14: 10 mL

## 2020-03-13 NOTE — Progress Notes (Signed)
PT Cancellation Note  Patient Details Name: Brandy Armstrong MRN: [id number] DOB: 04/22/1946   Cancelled Treatment:    Reason Eval/Treat Not Completed: Other (comment). Consult received and chart reviewed. Pt recently started on IV cardizem secondary to HR control and has elevated BG at 411. Will hold eval at this time. Will re-attempt another time.   Kasean Denherder 03/13/2020, 10:27 AM  Greggory Stallion, PT, DPT [phone]

## 2020-03-13 NOTE — Consult Note (Signed)
[name] for a heparin infusion Indication: afib on eliquis PTA  Patient Measurements: Height: 5\' 6"  (167.6 cm) Weight: 194 lb 3.6 oz (88.1 kg) IBW/kg (Calculated) : 59.3 Heparin Dosing Weight: 76.4 kg  Vital Signs: Temp: 98 F (36.7 C) (03/15 0754) Temp Source: Oral (03/15 0754) BP: 144/87 (03/15 0754) Pulse Rate: 98 (03/15 0754)  Labs: Recent Labs    03/11/20 0421 03/11/20 0421 03/12/20 0714 03/12/20 2141 03/13/20 0019 03/13/20 0907  HGB 9.5*   < > 10.0*  --   --  10.6*  HCT 33.7*  --  34.7*  --   --  35.8*  PLT 277  --  251  --   --  268  APTT  --   --   --   --  50* 85*  HEPARINUNFRC  --   --   --   --  1.27*  --   CREATININE 0.52  --  0.50 0.54  --   --    < > = values in this interval not displayed.    Estimated Creatinine Clearance: 72.1 mL/min (by C-G formula based on SCr of 0.54 mg/dL).   Medical History: Past Medical History:  Diagnosis Date  . A-fib ([place])   . Barretts esophagus   . Chest pain    a. 02/2014 Myoview: Ef 50%, no ischemia.  . Colon polyps   . COPD (chronic obstructive pulmonary disease) ([place])   . Depression with anxiety   . GERD (gastroesophageal reflux disease)   . Hx of benign essential tremor   . Neuropathy   . Permanent atrial fibrillation [facility])    a. s/p ablation 01/2012 in [facility] by Dr. Boyd Kerbs;  b. On sotalol & Xarelto;  c. 02/2014 Echo: EF 50-55%, mild conc LVH, nl LA size/structure;  d. Recurrent afib 8/15 & 08/29/2014.  [name] Rectal fistula   . Rheumatoid arthritis ([place])    On methotrexate and orencia  . Urinary incontinence   . Vitamin D deficiency     Medications:  Scheduled:  . busPIRone  10 mg Oral TID  . Chlorhexidine Gluconate Cloth  6 each Topical Daily  . feeding supplement (ENSURE ENLIVE)  237 mL Oral BID BM  . furosemide  40 mg Intravenous BID  . guaiFENesin  600 mg Oral BID  . hyoscyamine  0.125 mg Oral TID AC  . ipratropium-albuterol  3 mL Nebulization TID  .  lipase/protease/amylase  36,000 Units Oral TID WC  . magnesium oxide  200 mg Oral BID  . pantoprazole  40 mg Oral Daily  . sertraline  50 mg Oral Daily  . sodium chloride flush  10-40 mL Intracatheter Q12H  . traZODone  50 mg Oral QHS    Assessment: 74 y.o. femalewith a  history of  COPD, atrial fibrillation on Eliquis. Her last dose of Eliquis was 03/10/20 at 2334. She is being transitioned to a heparin drip in preparation for a possible chest tube placement. She was a patient at [facility] approximately one year ago where she received IV heparin. Data from that admission was used to guide dosing below.  Goal of Therapy:  Heparin level 0.3-0.7 units/ml once aPTT and Heparin level correlate.  aPTT 66 - 102 seconds Monitor platelets by anticoagulation protocol: Yes   Plan:  APTT is therapeutic. Will continue heparin rate at 1150 units/hr. Recheck aPTT in 8 hours. CBC daily while on heparin. Will order heparin level with AM labs.   [name], PharmD, BCPS Clinical  Pharmacist 03/13/2020,9:39 AM

## 2020-03-13 NOTE — Care Management Important Message (Signed)
Important Message  Patient Details  Name: Brandy Armstrong MRN: [id number] Date of Birth: 01/30/1946   Medicare Important Message Given:  Yes  Spoke with Irma Newness at [phone].  Copy of Medicare IM emailed securely to [email or web address].     Dannette Barbara 03/13/2020, 1:15 PM

## 2020-03-13 NOTE — Consult Note (Signed)
Brandy Armstrong for a heparin infusion Indication: afib on eliquis PTA  Patient Measurements: Height: 5\' 6"  (167.6 cm) Weight: 194 lb 3.6 oz (88.1 kg) IBW/kg (Calculated) : 59.3 Heparin Dosing Weight: 76.4 kg  Vital Signs: Temp: 98.5 F (36.9 C) (03/14 2034) Temp Source: Axillary (03/14 1518) BP: 123/93 (03/15 0022) Pulse Rate: 116 (03/15 0022)  Labs: Recent Labs    03/10/20 0414 03/10/20 0414 03/11/20 0421 03/12/20 0714 03/12/20 2141 03/13/20 0019  HGB 9.2*   < > 9.5* 10.0*  --   --   HCT 32.4*  --  33.7* 34.7*  --   --   PLT 272  --  277 251  --   --   APTT  --   --   --   --   --  50*  CREATININE 0.51   < > 0.52 0.50 0.54  --    < > = values in this interval not displayed.    Estimated Creatinine Clearance: 72.1 mL/min (by C-G formula based on SCr of 0.54 mg/dL).   Medical History: Past Medical History:  Diagnosis Date  . A-fib ([place])   . Barretts esophagus   . Chest pain    a. 02/2014 Myoview: Ef 50%, no ischemia.  . Colon polyps   . COPD (chronic obstructive pulmonary disease) ([place])   . Depression with anxiety   . GERD (gastroesophageal reflux disease)   . Hx of benign essential tremor   . Neuropathy   . Permanent atrial fibrillation [facility])    a. s/p ablation 01/2012 [facility] by Dr. Boyd Kerbs;  b. On sotalol & Xarelto;  c. 02/2014 Echo: EF 50-55%, mild conc LVH, nl LA size/structure;  d. Recurrent afib 8/15 & 08/29/2014.  [name] Rectal fistula   . Rheumatoid arthritis ([place])    On methotrexate and orencia  . Urinary incontinence   . Vitamin D deficiency     Medications:  Scheduled:  . busPIRone  10 mg Oral TID  . Chlorhexidine Gluconate Cloth  6 each Topical Daily  . dextrose      . dextrose      . feeding supplement (ENSURE ENLIVE)  237 mL Oral BID BM  . furosemide  40 mg Intravenous BID  . guaiFENesin  600 mg Oral BID  . hyoscyamine  0.125 mg Oral TID AC  . ipratropium-albuterol  3 mL Nebulization TID  . lidocaine (PF)   30 mL Infiltration Once  . lipase/protease/amylase  36,000 Units Oral TID WC  . magnesium oxide  200 mg Oral BID  . pantoprazole  40 mg Oral Daily  . sertraline  50 mg Oral Daily  . traZODone  50 mg Oral QHS    Assessment: 74 y.o. femalewith a  history of  COPD, atrial fibrillation on Eliquis. Her last dose of Eliquis was 03/10/20 at 2334. She is being transitioned to a heparin drip in preparation for a possible chest tube placement. She was a patient at [facility] approximately one year ago where she received IV heparin. Data from that admission was used to guide dosing below.  Goal of Therapy:  Heparin level 0.3-0.7 units/ml aPTT 66 - 102 seconds Monitor platelets by anticoagulation protocol: Yes   Plan:  03/15 @ 0000 aPTT 50 seconds subtherapeutic. Will increase rate to 1150 units/hr and will recheck aPTT at 0700 and continue to monitor.  Tobie Lords, PharmD, BCPS Clinical Pharmacist 03/13/2020,1:00 AM

## 2020-03-13 NOTE — Progress Notes (Signed)
PROGRESS NOTE    Brandy Armstrong  GNO:[id number] DOB: March 07, 1946 DOA: 03/09/2020 PCP: [name], FNP   Brief Narrative:  Brandy Armstrong  is  [place] 74 y.o. Caucasian female with Christina Waldrop known history of multiple medical problems that are mentioned below including COPD, atrial fibrillation, on Eliquis, peripheral neuropathy and rheumatoid arthritis, who was recently admitted for MSSA bacteremia of unclear etiology for which she continues to take IV antibiotic therapy through PICC line at [facility] where she resides.  She presents to the emergency room with acute onset of worsening dyspnea with hypoxemia to 85% on room air with associated cough occasionally productive of yellowish sputum without fever or chills.  She denied any nausea or vomiting or abdominal pain.  No worsening lower extremity edema..  She is dyspneic in all positions. The patient underwent ultrasound-guided right thoracentesis in the ER with drainage of 1 L of pleural fluid.  Upon presentation to the emergency room, blood pressure was 140/98 and pulse oximetry was up to 90% on 6 L O2 by nasal cannula.  Temperature was 98.1 and heart rate was 103.  Respiratory rate was 20 and later 22 and heart rate later on was up to 112.  Labs revealed anemia with hemoglobin of 9.4 hematocrit 34 comparable to previous levels on 2/21 with otherwise unremarkable CMP.  BNP was 573 compared to 519 on 2/23 and high-sensitivity troponin I was 7.  Chest x-ray initially showed whiteout right lung.  Pleural fluid analysis showed 69 lymphocytes and 12 neutrophils with total protein of 3.8 with LDH of 144. EKG showed atrial fibrillation with slight rapid ventricular response of 108 with Q waves inferiorly and T wave version laterally as well as prolonged QT interval.  The patient's oxygen requirement currently improved to 3.5 L O2 with pulse ox at 95 to 97% after right thoracentesis.  The patient was given nebulized albuterol as well as  duonebs twice, IV cefepime vancomycin and Flagyl.  She will be [facility] monitored [facility] for further evaluation and management.  Assessment & Plan:   Active Problems:   Pleural effusion  Exudative Right Pleural Effusion  Acute Hypoxic Respiratory Failure:  Currently requiring 3 L [place] S/p thoracentesis in ED with 1 L serosanguinous fluid removed on 3/11 3/12 with thoracentesis by IR with 1.2 L bloody pleural fluid -> c/b R basilar ex vacuo hydropneumothorax Repeat CXR 3/12 with interval reduction of R sided pleural effusion -> tiny R basilar ex vacuo hydropneumothorax.  Improved aeration of the R mid and lower lung with persistent R medial basilar heterogeneous/consolidative opacities (atelectasis vs infiltrate) CXR 3/13 with mild interval worsening in lung aeration on the R consistent with increase in R pleural fluid and atelectasis CXR 3/14 with moderate layering R pleural effusion with R lower lobe atelectasis CXR 3/15 with small R effusion and R basilar atelectasis Exudative by lights Follow cytology (pending) and culture (NG) Pulm c/s, appreciate recs -> recommended quant gold, isolation, if reaccumulation of fluid, may need chest tube (follow serial CXR), consider CT surgery, hold plaquenil and adalimumab Follow chest CT Cefepime Blood cx NGTD Body fluid cx with NG Elevated BNP, diurese as tolerated  # Delirium: persistently confused.  Likely 2/2 hypoxia and hospitalization.  Per family, she's been declining from mental status standpoint (and in general) over the past years and has been confused when hospitalized before. Follow TSH, B12, folate, VBG and head CT She has poor PO intake and is not taking meds  2.  Recent MSSA bacteremia. -We will continue IV Ancef that she supposed to take till 03/26/2020. (switch to cefepime for now to cover for pneumonia) -The patient has finished Aubryanna Nesheim course of IV Invanz on 03/08/2020 (will need to review this, I don't see this clearly?  Family member did acknowledge she was treated with abx at SNF)  3.  COPD. -She does not seem to have any current acute flare. -She will be placed on duo nebs as mentioned above.  4.  Rheumatoid arthritis. -We will hold Plaquenil given prolonged QT interval. - Repeat EKG -> improved qtc  5.  Hypertension. -We will continue HCTZ and Cardizem CD.  6.  Atrial fibrillation with mildly rapid ventricular response, likely secondary to #1. - Hold eliquis in preparation for procedure.  Heparin and dilt gtt (pt not taking PO well).  7.  Anxiety. -We will continue BuSpar.  8.  DVT prophylaxis. -eliquis on hold  # Hypokalemia: replace and follow  Goals of Care: have been updating [name].  Pt with delirium, not taking PO or meds.  Will c/s palliative care to have them follow along as well.  DVT prophylaxis: SCD, hold eliquis in case of procedure - heparin Code Status: full  Family Communication: kathy 3/15 Disposition Plan:  . Patient came from: SNF            . Anticipated d/c place: SNF . Barriers to d/c OR conditions which need to be met to effect Nissim Fleischer safe d/c: pending further eval and management of effusion and SOB   Consultants:   IR  Pulm  Procedures:   Thoracentesis 3/11 and 3/12  Antimicrobials:  Anti-infectives (From admission, onward)   Start     Dose/Rate Route Frequency Ordered Stop   03/10/20 1800  ceFEPIme (MAXIPIME) 2 g in sodium chloride 0.9 % 100 mL IVPB     2 g 200 mL/hr over 30 Minutes Intravenous Every 8 hours 03/10/20 1529     03/10/20 1600  piperacillin-tazobactam (ZOSYN) IVPB 3.375 g  Status:  Discontinued     3.375 g 12.5 mL/hr over 240 Minutes Intravenous Every 8 hours 03/10/20 1519 03/10/20 1529   03/10/20 1000  ceFAZolin (ANCEF) IVPB 2g/100 mL premix  Status:  Discontinued     2 g 200 mL/hr over 30 Minutes Intravenous Every 8 hours 03/10/20 0624 03/10/20 1519   03/09/20 2200  ceFAZolin (ANCEF) IVPB  Status:  Discontinued    Note to Pharmacy:  Indication:  MSSA bacteremia Last Day of Therapy:  03/26/2020 Labs - Once weekly:  CBC/D, CMP, CRP Please pull PIC at completion of IV antibiotics     2 g Intravenous Every 8 hours 03/09/20 2037 03/09/20 2144   03/09/20 2200  hydroxychloroquine (PLAQUENIL) tablet 200 mg  Status:  Discontinued     200 mg Oral 2 times daily 03/09/20 2037 03/10/20 0015   03/09/20 2200  ceFAZolin (ANCEF) IVPB 2g/100 mL premix  Status:  Discontinued     2 g 200 mL/hr over 30 Minutes Intravenous Every 8 hours 03/09/20 2144 03/10/20 0624   03/09/20 2015  ceFEPIme (MAXIPIME) 1 g in sodium chloride 0.9 % 100 mL IVPB  Status:  Discontinued     1 g 200 mL/hr over 30 Minutes Intravenous Once 03/09/20 2008 03/09/20 2144   03/09/20 2015  vancomycin (VANCOCIN) IVPB 1000 mg/200 mL premix  Status:  Discontinued     1,000 mg 200 mL/hr over 60 Minutes Intravenous  Once 03/09/20 2008 03/10/20 0117   03/09/20 2015  metroNIDAZOLE (FLAGYL)  IVPB 500 mg  Status:  Discontinued     500 mg 100 mL/hr over 60 Minutes Intravenous  Once 03/09/20 2008 03/10/20 0117     Subjective: Unable to communicate due to confusion No clear complaints  Objective: Vitals:   03/13/20 0838 03/13/20 1246 03/13/20 1422 03/13/20 1619  BP:  (!) 162/87  139/80  Pulse:  97  (!) 102  Resp:  20  20  Temp:  97.7 F (36.5 C)  98.5 F (36.9 C)  TempSrc:  Oral  Oral  SpO2: 96% 92% 92% 98%  Weight:      Height:        Intake/Output Summary (Last 24 hours) at 03/13/2020 1743 Last data filed at 03/13/2020 1635 Gross per 24 hour  Intake 1328.07 ml  Output 4350 ml  Net -3021.93 ml   Filed Weights   03/09/20 1710 03/09/20 2351  Weight: 81.6 kg 88.1 kg    Examination:  General: No acute distress. Cardiovascular: Heart sounds show Verdene Creson regular rate, and rhythm.  Lungs: Clear to auscultation bilaterally with good air movement. No rales, rhonchi or wheezes. Abdomen: Soft, nontender, nondistended with normal active bowel sounds. No masses. No  hepatosplenomegaly. Neurological: Alert and disoriented. Moves all extremities 4. Cranial nerves II through XII grossly intact. Skin: Warm and dry. No rashes or lesions. Extremities: No clubbing or cyanosis. No edema   Data Reviewed: I have personally reviewed following labs and imaging studies  CBC: Recent Labs  Lab 03/09/20 1702 03/10/20 0414 03/11/20 0421 03/12/20 0714 03/13/20 0907  WBC 9.3 7.8 6.9 5.5 6.8  NEUTROABS 7.5  --  5.0 4.1 5.0  HGB 9.4* 9.2* 9.5* 10.0* 10.6*  HCT 34.0* 32.4* 33.7* 34.7* 35.8*  MCV 87.6 85.7 85.1 84.4 82.3  PLT 376 272 277 251 937   Basic Metabolic Panel: Recent Labs  Lab 03/11/20 0421 03/12/20 0714 03/12/20 2141 03/13/20 0907 03/13/20 1113  NA 134* 135 135 129* 133*  K 3.9 3.1* 3.0* 3.0* 2.9*  CL 95* 87* 84* 79* 80*  CO2 30 36* 36* 36* 41*  GLUCOSE 84 82 70 411* 162*  BUN '18 14 15 16 17  '$ CREATININE 0.52 0.50 0.54 0.62 0.60  CALCIUM 8.6* 8.5* 8.6* 8.5* 8.6*  MG 1.8 1.7 1.5* 1.8  --   PHOS 2.6 2.5  --  2.2*  --    GFR: Estimated Creatinine Clearance: 72.1 mL/min (by C-G formula based on SCr of 0.6 mg/dL). Liver Function Tests: Recent Labs  Lab 03/09/20 1817 03/11/20 0421 03/12/20 0714 03/13/20 0907  AST '15 16 19 19  '$ ALT <5 <5 <5 6  ALKPHOS 101 80 92 96  BILITOT 0.6 0.6 1.0 1.5*  PROT 7.4 6.3* 6.5 7.2  ALBUMIN 2.6* 2.3* 2.4* 2.6*   No results for input(s): LIPASE, AMYLASE in the last 168 hours. No results for input(s): AMMONIA in the last 168 hours. Coagulation Profile: No results for input(s): INR, PROTIME in the last 168 hours. Cardiac Enzymes: No results for input(s): CKTOTAL, CKMB, CKMBINDEX, TROPONINI in the last 168 hours. BNP (last 3 results) No results for input(s): PROBNP in the last 8760 hours. HbA1C: No results for input(s): HGBA1C in the last 72 hours. CBG: Recent Labs  Lab 03/13/20 0030 03/13/20 0754 03/13/20 1028 03/13/20 1246 03/13/20 1620  GLUCAP 134* 95 106* 93 88   Lipid Profile: No results  for input(s): CHOL, HDL, LDLCALC, TRIG, CHOLHDL, LDLDIRECT in the last 72 hours. Thyroid Function Tests: No results for input(s): TSH, T4TOTAL, FREET4, T3FREE, THYROIDAB  in the last 72 hours. Anemia Panel: No results for input(s): VITAMINB12, FOLATE, FERRITIN, TIBC, IRON, RETICCTPCT in the last 72 hours. Sepsis Labs: No results for input(s): PROCALCITON, LATICACIDVEN in the last 168 hours.  Recent Results (from the past 240 hour(s))  Body fluid culture (includes gram stain)     Status: None   Collection Time: 03/09/20  6:52 PM   Specimen: Pleural Fluid  Result Value Ref Range Status   Specimen Description   Final    PLEURAL Performed at [facility], [address] [place], [place] [address]    Special Requests   Final    PLEURAL Performed at [facility], [address] [place], [place] [address]    Gram Stain   Final    FEW WBC PRESENT, PREDOMINANTLY MONONUCLEAR NO ORGANISMS SEEN Performed at [facility], [place] [address], [place], [place] [address]    Culture NO GROWTH  Final   Report Status 03/13/2020 FINAL  Final  Respiratory Panel by RT PCR (Flu Koran Seabrook&B, Covid) - Nasopharyngeal Swab     Status: None   Collection Time: 03/09/20  8:37 PM   Specimen: Nasopharyngeal Swab  Result Value Ref Range Status   SARS Coronavirus 2 by RT PCR NEGATIVE NEGATIVE Final    Comment: (NOTE) SARS-CoV-2 target nucleic acids are NOT DETECTED. The SARS-CoV-2 RNA is generally detectable in upper respiratoy specimens during the acute phase of infection. The lowest concentration of SARS-CoV-2 viral copies this assay can detect is 131 copies/mL. Ansh Fauble negative result does not preclude SARS-Cov-2 infection and should not be used as the sole basis for treatment or other patient management decisions. Lacora Folmer negative result may occur with  improper specimen collection/handling, submission of specimen other than nasopharyngeal swab, presence of viral mutation(s) within the areas targeted  by this assay, and inadequate number of viral copies (<131 copies/mL). Terrence Wishon negative result must be combined with clinical observations, patient history, and epidemiological information. The expected result is Negative. Fact Sheet for Patients:  PinkCheek.be Fact Sheet for Healthcare Providers:  GravelBags.it This test is not yet ap proved or cleared by the [place] FDA and  has been authorized for detection and/or diagnosis of SARS-CoV-2 by FDA under an Emergency Use Authorization (EUA). This EUA will remain  in effect (meaning this test can be used) for the duration of the COVID-19 declaration under Section 564(b)(1) of the Act, 21 U.S.C. section 360bbb-3(b)(1), unless the authorization is terminated or revoked sooner.    Influenza Lakeisha Waldrop by PCR NEGATIVE NEGATIVE Final   Influenza B by PCR NEGATIVE NEGATIVE Final    Comment: (NOTE) The Xpert Xpress SARS-CoV-2/FLU/RSV assay is intended as an aid in  the diagnosis of influenza from Nasopharyngeal swab specimens and  should not be used as Natash Berman sole basis for treatment. Nasal washings and  aspirates are unacceptable for Xpert Xpress SARS-CoV-2/FLU/RSV  testing. Fact Sheet for Patients: PinkCheek.be Fact Sheet for Healthcare Providers: GravelBags.it This test is not yet approved or cleared by the [place] FDA and  has been authorized for detection and/or diagnosis of SARS-CoV-2 by  FDA under an Emergency Use Authorization (EUA). This EUA will remain  in effect (meaning this test can be used) for the duration of the  Covid-19 declaration under Section 564(b)(1) of the Act, 21  U.S.C. section 360bbb-3(b)(1), unless the authorization is  terminated or revoked. Performed at [facility], [address] [place], [place] [address]   Culture, blood (routine x 2)  Status: None (Preliminary result)   Collection  Time: 03/09/20  9:31 PM   Specimen: BLOOD  Result Value Ref Range Status   Specimen Description BLOOD LEFT ASSIST CONTROL  Final   Special Requests   Final    BOTTLES DRAWN AEROBIC AND ANAEROBIC Blood Culture adequate volume   Culture   Final    NO GROWTH 4 DAYS Performed at [facility], [address] [place], [place] [address]    Report Status PENDING  Incomplete  Culture, blood (routine x 2)     Status: None (Preliminary result)   Collection Time: 03/09/20  9:45 PM   Specimen: BLOOD  Result Value Ref Range Status   Specimen Description BLOOD RIGHT ASSIST CONTROL  Final   Special Requests   Final    BOTTLES DRAWN AEROBIC AND ANAEROBIC Blood Culture adequate volume   Culture   Final    NO GROWTH 4 DAYS Performed at [facility], [address] [place], [place] [address]    Report Status PENDING  Incomplete  MRSA PCR Screening     Status: None   Collection Time: 03/10/20  3:58 PM   Specimen: Nasopharyngeal  Result Value Ref Range Status   MRSA by PCR NEGATIVE NEGATIVE Final    Comment:        The GeneXpert MRSA Assay (FDA approved for NASAL specimens only), is one component of Pearline Yerby comprehensive MRSA colonization surveillance program. It is not intended to diagnose MRSA infection nor to guide or monitor treatment for MRSA infections. Performed at [facility], [address], [place], [place] [address]          Radiology Studies: DG Chest [place] 1 View  Result Date: 03/13/2020 CLINICAL DATA:  RIGHT pleural effusion, prior thoracentesis on 03/10/2020, COPD, former smoker, atrial fibrillation EXAM: PORTABLE CHEST 1 VIEW COMPARISON:  Portable exam 0722 hours compared to 03/12/2020 FINDINGS: Rotated to the RIGHT. RIGHT arm PICC line tip projects over SVC. Normal heart size mediastinal contours for degree of rotation. Small RIGHT pleural effusion and RIGHT basilar atelectasis. LEFT lung clear. No definite infiltrate or pneumothorax. Bones  demineralized. IMPRESSION: Small RIGHT pleural effusion and RIGHT basilar atelectasis, unchanged. Electronically Signed   By: Lavonia Dana M.D.   On: 03/13/2020 08:08   DG Chest Port 1 View  Result Date: 03/12/2020 CLINICAL DATA:  Hypoxia, shortness of breath EXAM: PORTABLE CHEST 1 VIEW COMPARISON:  03/11/2020 FINDINGS: Moderate layering right pleural effusion. Associated right lower lobe opacity, likely atelectasis. Left lung is clear. No pneumothorax. Overall, this appearance is unchanged. Cardiomegaly. Right arm PICC terminates the cavoatrial junction. IMPRESSION: Moderate layering right pleural effusion with right lower lobe atelectasis, unchanged. Electronically Signed   By: Julian Hy M.D.   On: 03/12/2020 09:49        Scheduled Meds: . busPIRone  10 mg Oral TID  . Chlorhexidine Gluconate Cloth  6 each Topical Daily  . feeding supplement (ENSURE ENLIVE)  237 mL Oral BID BM  . guaiFENesin  600 mg Oral BID  . hyoscyamine  0.125 mg Oral TID AC  . ipratropium-albuterol  3 mL Nebulization TID  . lipase/protease/amylase  36,000 Units Oral TID WC  . magnesium oxide  200 mg Oral BID  . pantoprazole  40 mg Oral Daily  . phosphorus  500 mg Oral Q4H  . sertraline  50 mg Oral Daily  . sodium chloride flush  10-40 mL Intracatheter Q12H  . traZODone  50 mg Oral QHS   Continuous Infusions: .  sodium chloride Stopped (03/13/20 0028)  . ceFEPime (MAXIPIME) IV 2 g (03/13/20 1232)  . D-10-0.45% Sodium Chloride with KCL 40 meq/L 1000 ml    . diltiazem (CARDIZEM) infusion 10 mg/hr (03/13/20 0606)  . heparin 1,150 Units/hr (03/13/20 1513)  . potassium chloride 10 mEq (03/13/20 1619)     LOS: 4 days    Time spent: over 30 min    Fayrene Helper, MD [facility]   To contact the attending provider between 7A-7P or the covering provider during after hours 7P-7A, please log into the web site [email or web address] and access using universal [place] password for that web site. If you  do not have the password, please call the hospital operator.  03/13/2020, 5:43 PM

## 2020-03-13 NOTE — Evaluation (Signed)
Clinical/Bedside Swallow Evaluation Patient Details  Name: Brandy Armstrong MRN: [id number] Date of Birth: 04-15-1946  Today's Date: 03/13/2020 Time: SLP Start Time (ACUTE ONLY): 1200 SLP Stop Time (ACUTE ONLY): 1254 SLP Time Calculation (min) (ACUTE ONLY): 54 min  Past Medical History:  Past Medical History:  Diagnosis Date  . A-fib ([place])   . Barretts esophagus   . Chest pain    a. 02/2014 Myoview: Ef 50%, no ischemia.  . Colon polyps   . COPD (chronic obstructive pulmonary disease) ([place])   . Depression with anxiety   . GERD (gastroesophageal reflux disease)   . Hx of benign essential tremor   . Neuropathy   . Permanent atrial fibrillation [facility])    a. s/p ablation 01/2012 in [facility] by Dr. Boyd Kerbs;  b. On sotalol & Xarelto;  c. 02/2014 Echo: EF 50-55%, mild conc LVH, nl LA size/structure;  d. Recurrent afib 8/15 & 08/29/2014.  [name] Rectal fistula   . Rheumatoid arthritis ([place])    On methotrexate and orencia  . Urinary incontinence   . Vitamin D deficiency    Past Surgical History:  Past Surgical History:  Procedure Laterality Date  . ABDOMINAL HYSTERECTOMY  1992  . APPENDECTOMY    . CARDIAC ELECTROPHYSIOLOGY STUDY AND ABLATION  2013  . CHOLECYSTECTOMY  1987  . COLONOSCOPY N/A 05/08/2015   Procedure: COLONOSCOPY;  Surgeon: Manya Silvas, MD;  Location: [facility] ENDOSCOPY;  Service: Endoscopy;  Laterality: N/A;  . COLONOSCOPY WITH PROPOFOL N/A 09/29/2019   Procedure: COLONOSCOPY WITH PROPOFOL;  Surgeon: Toledo, Benay Pike, MD;  Location: [facility] ENDOSCOPY;  Service: Gastroenterology;  Laterality: N/A;  . ESOPHAGOGASTRODUODENOSCOPY N/A 05/06/2015   Procedure: ESOPHAGOGASTRODUODENOSCOPY (EGD);  Surgeon: Lollie Sails, MD;  Location: [facility] ENDOSCOPY;  Service: Endoscopy;  Laterality: N/A;  . ESOPHAGOGASTRODUODENOSCOPY (EGD) WITH PROPOFOL N/A 09/29/2019   Procedure: ESOPHAGOGASTRODUODENOSCOPY (EGD) WITH PROPOFOL;  Surgeon: Toledo, Benay Pike, MD;  Location: [facility] ENDOSCOPY;  Service:  Gastroenterology;  Laterality: N/A;  . LAPAROSCOPIC APPENDECTOMY N/A 02/19/2019   Procedure: APPENDECTOMY LAPAROSCOPIC converted to open appendectomy;  Surgeon: Herbert Pun, MD;  Location: [facility] ORS;  Service: General;  Laterality: N/A;  . OOPHORECTOMY    . oophrectomy Bilateral 1992  . TEE WITHOUT CARDIOVERSION N/A 02/21/2020   Procedure: TRANSESOPHAGEAL ECHOCARDIOGRAM (TEE);  Surgeon: Wellington Hampshire, MD;  Location: [facility] ORS;  Service: Cardiovascular;  Laterality: N/A;  . TEE WITHOUT CARDIOVERSION N/A 02/23/2020   Procedure: TRANSESOPHAGEAL ECHOCARDIOGRAM (TEE);  Surgeon: Kate Sable, MD;  Location: [facility] ORS;  Service: Cardiovascular;  Laterality: N/A;   HPI: Per admitting history and Physical:   "Brandy Armstrong  is a 74 y.o. Caucasian female with a known history of multiple medical problems that are mentioned below including COPD, atrial fibrillation, on Eliquis, peripheral neuropathy and rheumatoid arthritis, who was recently admitted for MSSA bacteremia of unclear etiology for which she continues to take IV antibiotic therapy through PICC line at [facility] facility where she resides.  She presents to the emergency room with acute onset of worsening dyspnea with hypoxemia to 85% on room air with associated cough occasionally productive of yellowish sputum without fever or chills.  She denied any nausea or vomiting or abdominal pain.  No worsening lower extremity edema..  She is dyspneic in all positions. The patient underwent ultrasound-guided right thoracentesis in the ER with drainage of 1 L of pleural fluid."  Assessment / Plan / Recommendation Clinical Impression  Bedside swallow eval today was very limited secondary to mental status and  limited ability to participate. Pt was awake, followed ST with her eyes, but was not able to consistently follow directions for oral mech exam. When presented with a piece of ice, Pt held it in her mouth maintaining an open  mouth, with no labial seal or attempts to propel for swallow or allow to melt. The piece of ice was eventually removed from her mouth. Given a spoon with applesauce, Pt made no attempts to retrieve the bolus from the spoon with a lip seal. When a small amount of applesauce was placed on her tongue, she eventually propelled for a swallow with max cues and extra time. When presented with liquids, she made no attempts to take the liquids from a cup or the straw. No s/s of aspiration with the 2 small bites of applesauce. Will alter diet to Dysphagia 1 with nectar thick liquids in the event that mental status improves enough to take PO's. Discussed with MD and Nsg. Pt is not likely to be able to maintain nutrition and hydration via PO's alone unless her mental status improves significantly. MD reports he will discuss status with family and consider palliative care consult. ST to follow up in hopes of overall improvement. SLP Visit Diagnosis: Dysphagia, oropharyngeal phase (R13.12)    Aspiration Risk  Risk for inadequate nutrition/hydration;Mild aspiration risk    Diet Recommendation Dysphagia 1 (Puree);Nectar-thick liquid   Medication Administration: Via alternative means Supervision: Staff to assist with self feeding Compensations: Small sips/bites;Minimize environmental distractions Postural Changes: Seated upright at 90 degrees;Remain upright for at least 30 minutes after po intake    Other  Recommendations   Consider palliative care consult to discuss possibility of alternative method of nutrition, plan of care if mental status/cognition does not improve.    Follow up Recommendations Skilled Nursing facility      Frequency and Duration min 2x/week  1 week       Prognosis Barriers to Reach Goals: Cognitive deficits      Swallow Study   General Date of Onset: 03/09/20 Type of Study: Bedside Swallow Evaluation Diet Prior to this Study: Regular Temperature Spikes Noted:  No Behavior/Cognition: Doesn't follow directions Oral Cavity Assessment: Within Functional Limits Oral Cavity - Dentition: Edentulous Self-Feeding Abilities: Total assist Patient Positioning: Upright in bed    Oral/Motor/Sensory Function Overall Oral Motor/Sensory Function: Moderate impairment   Ice Chips Ice chips: Impaired Presentation: Spoon Oral Phase Impairments: Poor awareness of bolus Oral Phase Functional Implications: Oral holding   Thin Liquid Thin Liquid: Impaired Presentation: Straw;Spoon;Cup Oral Phase Impairments: Poor awareness of bolus    Nectar Thick Nectar Thick Liquid: Impaired   Honey Thick     Puree Puree: Impaired Presentation: Spoon Oral Phase Impairments: Poor awareness of bolus Oral Phase Functional Implications: Prolonged oral transit;Oral holding   Solid     Solid: Not tested      Lucila Maine 03/13/2020,12:55 PM

## 2020-03-13 NOTE — Consult Note (Signed)
[place] for a heparin infusion Indication: afib on eliquis PTA  Patient Measurements: Height: 5\' 6"  (167.6 cm) Weight: 194 lb 3.6 oz (88.1 kg) IBW/kg (Calculated) : 59.3 Heparin Dosing Weight: 76.4 kg  Vital Signs: Temp: 97.7 F (36.5 C) (03/15 1246) Temp Source: Oral (03/15 1246) BP: 162/87 (03/15 1246) Pulse Rate: 97 (03/15 1246)  Labs: Recent Labs    03/11/20 0421 03/11/20 0421 03/12/20 0714 03/12/20 0714 03/12/20 2141 03/13/20 0019 03/13/20 0907 03/13/20 1113  HGB 9.5*   < > 10.0*  --   --   --  10.6*  --   HCT 33.7*  --  34.7*  --   --   --  35.8*  --   PLT 277  --  251  --   --   --  268  --   APTT  --   --   --   --   --  50* 85*  --   HEPARINUNFRC  --   --   --   --   --  1.27*  --   --   CREATININE 0.52   < > 0.50   < > 0.54  --  0.62 0.60   < > = values in this interval not displayed.    Estimated Creatinine Clearance: 72.1 mL/min (by C-G formula based on SCr of 0.6 mg/dL).   Medical History: Past Medical History:  Diagnosis Date  . A-fib ([place])   . Barretts esophagus   . Chest pain    a. 02/2014 Myoview: Ef 50%, no ischemia.  . Colon polyps   . COPD (chronic obstructive pulmonary disease) ([place])   . Depression with anxiety   . GERD (gastroesophageal reflux disease)   . Hx of benign essential tremor   . Neuropathy   . Permanent atrial fibrillation [facility])    a. s/p ablation 01/2012 in [facility] by Dr. Boyd Kerbs;  b. On sotalol & Xarelto;  c. 02/2014 Echo: EF 50-55%, mild conc LVH, nl LA size/structure;  d. Recurrent afib 8/15 & 08/29/2014.  [name] Rectal fistula   . Rheumatoid arthritis ([place])    On methotrexate and orencia  . Urinary incontinence   . Vitamin D deficiency     Medications:  Scheduled:  . busPIRone  10 mg Oral TID  . Chlorhexidine Gluconate Cloth  6 each Topical Daily  . feeding supplement (ENSURE ENLIVE)  237 mL Oral BID BM  . [START ON 03/14/2020] furosemide  40 mg Intravenous Daily  . guaiFENesin   600 mg Oral BID  . hyoscyamine  0.125 mg Oral TID AC  . ipratropium-albuterol  3 mL Nebulization TID  . lipase/protease/amylase  36,000 Units Oral TID WC  . magnesium oxide  200 mg Oral BID  . pantoprazole  40 mg Oral Daily  . phosphorus  500 mg Oral Q4H  . sertraline  50 mg Oral Daily  . sodium chloride flush  10-40 mL Intracatheter Q12H  . traZODone  50 mg Oral QHS    Assessment: 74 y.o. femalewith a  history of  COPD, atrial fibrillation on Eliquis. Her last dose of Eliquis was 03/10/20 at 2334. She is being transitioned to a heparin drip in preparation for a possible chest tube placement. She was a patient at [facility] approximately one year ago where she received IV heparin. Data from that admission was used to guide initial dosing  Goal of Therapy:  Heparin level 0.3-0.7 units/ml once aPTT and Heparin level correlate.  aPTT 66 - 102 seconds Monitor platelets by anticoagulation protocol: Yes   Plan:   aPTT is sub-therapeutic: increase heparin rate to 1300 units/hr  Re-check aPTT in 8 hours after rate change  CBC daily while on heparin  Next heparin level with AM labs.   Vallery Sa, PharmD, BCPS Clinical Pharmacist 03/13/2020,2:32 PM

## 2020-03-13 NOTE — Plan of Care (Signed)
  Problem: Education: Goal: Knowledge of General Education information will improve Description: Including pain rating scale, medication(s)/side effects and non-pharmacologic comfort measures Outcome: Not Progressing   Problem: Health Behavior/Discharge Planning: Goal: Ability to manage health-related needs will improve Outcome: Not Progressing   Problem: Clinical Measurements: Goal: Ability to maintain clinical measurements within normal limits will improve Outcome: Not Progressing Note: Serum K+ has continued to drop, even after 6 runs of IV replacement overnight. To administer 6 more IV runs today. Will continue to monitor for improvement in lab values for the remainder of the shift. Wenda Low [facility]

## 2020-03-14 LAB — CULTURE, BLOOD (ROUTINE X 2)
Culture: NO GROWTH
Culture: NO GROWTH
Special Requests: ADEQUATE
Special Requests: ADEQUATE

## 2020-03-14 LAB — COMPREHENSIVE METABOLIC PANEL
ALT: 6 U/L (ref 0–44)
AST: 19 U/L (ref 15–41)
Albumin: 2.4 g/dL — ABNORMAL LOW (ref 3.5–5.0)
Alkaline Phosphatase: 94 U/L (ref 38–126)
Anion gap: 16 — ABNORMAL HIGH (ref 5–15)
BUN: 22 mg/dL (ref 8–23)
CO2: 38 mmol/L — ABNORMAL HIGH (ref 22–32)
Calcium: 8.8 mg/dL — ABNORMAL LOW (ref 8.9–10.3)
Chloride: 82 mmol/L — ABNORMAL LOW (ref 98–111)
Creatinine, Ser: 0.7 mg/dL (ref 0.44–1.00)
GFR calc Af Amer: >60 mL/min (ref >60)
GFR calc non Af Amer: >60 mL/min (ref >60)
Glucose, Bld: 106 mg/dL — ABNORMAL HIGH (ref 70–99)
Potassium: 3.4 mmol/L — ABNORMAL LOW (ref 3.5–5.1)
Sodium: 136 mmol/L (ref 135–145)
Total Bilirubin: 1.5 mg/dL — ABNORMAL HIGH (ref 0.3–1.2)
Total Protein: 6.8 g/dL (ref 6.5–8.1)

## 2020-03-14 LAB — BLOOD GAS, VENOUS
Acid-Base Excess: 19.4 mmol/L — ABNORMAL HIGH (ref 0.0–2.0)
Bicarbonate: 46 mmol/L — ABNORMAL HIGH (ref 20.0–28.0)
FIO2: 0.32
O2 Saturation: 75.1 %
Patient temperature: 37
pCO2, Ven: 59 mmHg (ref 44.0–60.0)
pH, Ven: 7.5 — ABNORMAL HIGH (ref 7.250–7.430)
pO2, Ven: 36 mmHg (ref 32.0–45.0)

## 2020-03-14 LAB — CBC WITH DIFFERENTIAL/PLATELET
Abs Immature Granulocytes: 0.03 10*3/uL (ref 0.00–0.07)
Basophils Absolute: 0 10*3/uL (ref 0.0–0.1)
Basophils Relative: 0 %
Eosinophils Absolute: 0.1 10*3/uL (ref 0.0–0.5)
Eosinophils Relative: 1 %
HCT: 34 % — ABNORMAL LOW (ref 36.0–46.0)
Hemoglobin: 10.6 g/dL — ABNORMAL LOW (ref 12.0–15.0)
Immature Granulocytes: 0 %
Lymphocytes Relative: 12 %
Lymphs Abs: 1 10*3/uL (ref 0.7–4.0)
MCH: 24.9 pg — ABNORMAL LOW (ref 26.0–34.0)
MCHC: 31.2 g/dL (ref 30.0–36.0)
MCV: 80 fL (ref 80.0–100.0)
Monocytes Absolute: 1 10*3/uL (ref 0.1–1.0)
Monocytes Relative: 13 %
Neutro Abs: 5.8 10*3/uL (ref 1.7–7.7)
Neutrophils Relative %: 74 %
Platelets: 283 10*3/uL (ref 150–400)
RBC: 4.25 MIL/uL (ref 3.87–5.11)
RDW: 19.8 % — ABNORMAL HIGH (ref 11.5–15.5)
WBC: 7.9 10*3/uL (ref 4.0–10.5)
nRBC: 0 % (ref 0.0–0.2)

## 2020-03-14 LAB — GLUCOSE, CAPILLARY
Glucose-Capillary: 100 mg/dL — ABNORMAL HIGH (ref 70–99)
Glucose-Capillary: 102 mg/dL — ABNORMAL HIGH (ref 70–99)
Glucose-Capillary: 104 mg/dL — ABNORMAL HIGH (ref 70–99)
Glucose-Capillary: 116 mg/dL — ABNORMAL HIGH (ref 70–99)
Glucose-Capillary: 117 mg/dL — ABNORMAL HIGH (ref 70–99)

## 2020-03-14 LAB — ACID FAST SMEAR (AFB, MYCOBACTERIA): Acid Fast Smear: NEGATIVE

## 2020-03-14 LAB — QUANTIFERON-TB GOLD PLUS (RQFGPL)
QuantiFERON Mitogen Value: 1.37 IU/mL
QuantiFERON Nil Value: 0.08 IU/mL
QuantiFERON TB1 Ag Value: 0.09 IU/mL
QuantiFERON TB2 Ag Value: 0.09 IU/mL

## 2020-03-14 LAB — PHOSPHORUS: Phosphorus: 2.9 mg/dL (ref 2.5–4.6)

## 2020-03-14 LAB — HEPARIN LEVEL (UNFRACTIONATED): Heparin Unfractionated: 1.05 IU/mL — ABNORMAL HIGH (ref 0.30–0.70)

## 2020-03-14 LAB — QUANTIFERON-TB GOLD PLUS: QuantiFERON-TB Gold Plus: NEGATIVE

## 2020-03-14 LAB — APTT
aPTT: 47 seconds — ABNORMAL HIGH (ref 24–36)
aPTT: 100 seconds — ABNORMAL HIGH (ref 24–36)
aPTT: 83 seconds — ABNORMAL HIGH (ref 24–36)

## 2020-03-14 LAB — MAGNESIUM: Magnesium: 1.8 mg/dL (ref 1.7–2.4)

## 2020-03-14 NOTE — Consult Note (Signed)
[place] for a heparin infusion Indication: afib on eliquis PTA  Patient Measurements: Height: 5\' 6"  (167.6 cm) Weight: 175 lb 12.8 oz (79.7 kg) IBW/kg (Calculated) : 59.3 Heparin Dosing Weight: 76.4 kg  Vital Signs: Temp: 98.8 F (37.1 C) (03/16 1320) Temp Source: Oral (03/16 1320) BP: 116/72 (03/16 1320) Pulse Rate: 103 (03/16 1320)  Labs: Recent Labs    03/12/20 0714 03/12/20 0714 03/12/20 2141 03/13/20 0019 03/13/20 0907 03/13/20 1113 03/13/20 1735 03/14/20 0455 03/14/20 0457 03/14/20 1241 03/14/20 1951  HGB 10.0*   < >  --   --  10.6*  --   --   --  10.6*  --   --   HCT 34.7*  --   --   --  35.8*  --   --   --  34.0*  --   --   PLT 251  --   --   --  268  --   --   --  283  --   --   APTT  --    < >  --  50* 85*  --    < > 47*  --  100* 83*  HEPARINUNFRC  --   --   --  1.27*  --   --   --  1.05*  --   --   --   CREATININE 0.50   < >   < >  --  0.62 0.60  --  0.70  --   --   --    < > = values in this interval not displayed.    Estimated Creatinine Clearance: 68.7 mL/min (by C-G formula based on SCr of 0.7 mg/dL).   Medical History: Past Medical History:  Diagnosis Date  . A-fib ([place])   . Barretts esophagus   . Chest pain    a. 02/2014 Myoview: Ef 50%, no ischemia.  . Colon polyps   . COPD (chronic obstructive pulmonary disease) ([place])   . Depression with anxiety   . GERD (gastroesophageal reflux disease)   . Hx of benign essential tremor   . Neuropathy   . Permanent atrial fibrillation [facility])    a. s/p ablation 01/2012 in [facility] by Dr. Boyd Kerbs;  b. On sotalol & Xarelto;  c. 02/2014 Echo: EF 50-55%, mild conc LVH, nl LA size/structure;  d. Recurrent afib 8/15 & 08/29/2014.  [name] Rectal fistula   . Rheumatoid arthritis ([place])    On methotrexate and orencia  . Urinary incontinence   . Vitamin D deficiency     Medications:  Scheduled:  . busPIRone  10 mg Oral TID  . Chlorhexidine Gluconate Cloth  6 each Topical Daily   . feeding supplement (ENSURE ENLIVE)  237 mL Oral BID BM  . guaiFENesin  600 mg Oral BID  . ipratropium-albuterol  3 mL Nebulization TID  . lipase/protease/amylase  36,000 Units Oral TID WC  . magnesium oxide  200 mg Oral BID  . pantoprazole  40 mg Oral Daily  . sertraline  50 mg Oral Daily  . sodium chloride flush  10-40 mL Intracatheter Q12H  . traZODone  50 mg Oral QHS    Assessment: 74 y.o. femalewith a  history of  COPD, atrial fibrillation on Eliquis. Her last dose of Eliquis was 03/10/20 at 2334. She is being transitioned to a heparin drip in preparation for a possible chest tube placement. She was a patient at [facility] approximately one year ago where she received  IV heparin. Data from that admission was used to guide initial dosing   Goal of Therapy:  Heparin level 0.3-0.7 units/ml once aPTT and Heparin level correlate.  aPTT 66 - 102 seconds Monitor platelets by anticoagulation protocol: Yes   Plan:   3/16 @ 1951 aPTT 83, therapeutic x 2. Continue current rate  Re-check aPTT and HL (for correlation) tomorrow morning   CBC daily while on heparin  [place] Resident 03/14/2020,8:31 PM

## 2020-03-14 NOTE — Progress Notes (Signed)
PROGRESS NOTE    Brandy Armstrong  VVO:[id number] DOB: 1946/05/04 DOA: 03/09/2020 PCP: [name], FNP   Brief Narrative:  Brandy Armstrong  is Brandy Armstrong 74 y.o. Caucasian female with Shann Lewellyn known history of multiple medical problems that are mentioned below including COPD, atrial fibrillation, on Eliquis, peripheral neuropathy and rheumatoid arthritis, who was recently admitted for MSSA bacteremia of unclear etiology for which she continues to take IV antibiotic therapy through PICC line at [facility] where she resides.  She presented to the emergency room with acute onset of worsening dyspnea with hypoxemia to 85% on room air with associated cough occasionally productive of yellowish sputum without fever or chills.  She was admitted for acute hypoxic resp failure in the setting of Thang Flett R sided exudative effusion.  Pulmonology following.  Assessment & Plan:   Active Problems:   Pleural effusion  Exudative Right Pleural Effusion  Acute Hypoxic Respiratory Failure:  Currently requiring 1 L [place] S/p thoracentesis in ED with 1 L serosanguinous fluid removed on 3/11 3/12 with thoracentesis by IR with 1.2 L bloody pleural fluid -> c/b R basilar ex vacuo hydropneumothorax Repeat CXR 3/12 with interval reduction of R sided pleural effusion -> tiny R basilar ex vacuo hydropneumothorax.  Improved aeration of the R mid and lower lung with persistent R medial basilar heterogeneous/consolidative opacities (atelectasis vs infiltrate) CXR 3/13 with mild interval worsening in lung aeration on the R consistent with increase in R pleural fluid and atelectasis CXR 3/14 with moderate layering R pleural effusion with R lower lobe atelectasis CXR 3/15 with small R effusion and R basilar atelectasis CT chest on 3/15 notable for small R sided effusion with mild loculation at posterior R upper lobe (overall decreased in size) - residual consolidation, atelectasis vs pneumonia. Would reconsult pulm on 3/17  to discuss CT findings Exudative by lights Follow cytology (pending ->would follow up with path lab tomorrow) and culture (NG) Pulm c/s, appreciate recs (3/13 note) -> recommended isolation, quant gold (negative), AFB smear from pleural fluid is negative (AFB cx is pending), will need to discuss with pulm prior to d/c isolation (? Need for 3 sputum afb's?), if reaccumulation of fluid, may need chest tube (follow serial CXR), consider CT surgery, hold plaquenil and adalimumab Cefepime Blood cx NGTD Body fluid cx with NG Elevated BNP, consider lasix as tolerated  # Delirium: persistently confused.  Likely 2/2 hypoxia and hospitalization.  Per family, she's been declining from mental status standpoint (and in general) over the past years and has been confused when hospitalized before. Follow TSH (wnl), B12 (wnl), folate (wnl), VBG (without hypercarbia) and head CT (without acute abnormality) She has poor PO intake and is not taking meds  2.  Recent MSSA bacteremia. -We will continue IV Ancef that she supposed to take till 03/26/2020. (switch to cefepime for now to cover for pneumonia -> consider 7 day course of cefepime, then transition bact to ancef?) -The patient has finished Tameka Hoiland course of IV Invanz on 03/08/2020 (will need to review this, I don't see this clearly? Family member did acknowledge she was treated with abx at SNF)  3.  COPD. -She does not seem to have any current acute flare. -She will be placed on duo nebs as mentioned above.  4.  Rheumatoid arthritis. -We will hold Plaquenil given prolonged QT interval. - Repeat EKG -> improved qtc  5.  Hypertension. -We will continue HCTZ and Cardizem CD.  6.  Atrial fibrillation with mildly rapid  ventricular response, likely secondary to #1. - Hold eliquis in preparation for procedure.  Heparin and dilt gtt (pt not taking PO well).  7.  Anxiety. -We will continue BuSpar.  8.  DVT prophylaxis. -eliquis on hold  # Hypokalemia:  replace and follow  Goals of Care: have been updating [name].  Pt with delirium, not taking PO or meds.  Will c/s palliative care to have them follow along as well. See palliative note from 3/16   DVT prophylaxis: SCD, hold eliquis in case of procedure - heparin Code Status: full  Family Communication: [name] 3/15 Disposition Plan:  . Patient came from: SNF            . Anticipated d/c place: SNF . Barriers to d/c OR conditions which need to be met to effect Arie Gable safe d/c: pending further eval and management of effusion and SOB   Consultants:   IR  Pulm  Procedures:   Thoracentesis 3/11 and 3/12  Antimicrobials:  Anti-infectives (From admission, onward)   Start     Dose/Rate Route Frequency Ordered Stop   03/10/20 1800  ceFEPIme (MAXIPIME) 2 g in sodium chloride 0.9 % 100 mL IVPB     2 g 200 mL/hr over 30 Minutes Intravenous Every 8 hours 03/10/20 1529     03/10/20 1600  piperacillin-tazobactam (ZOSYN) IVPB 3.375 g  Status:  Discontinued     3.375 g 12.5 mL/hr over 240 Minutes Intravenous Every 8 hours 03/10/20 1519 03/10/20 1529   03/10/20 1000  ceFAZolin (ANCEF) IVPB 2g/100 mL premix  Status:  Discontinued     2 g 200 mL/hr over 30 Minutes Intravenous Every 8 hours 03/10/20 0624 03/10/20 1519   03/09/20 2200  ceFAZolin (ANCEF) IVPB  Status:  Discontinued    Note to Pharmacy: Indication:  MSSA bacteremia Last Day of Therapy:  03/26/2020 Labs - Once weekly:  CBC/D, CMP, CRP Please pull PIC at completion of IV antibiotics     2 g Intravenous Every 8 hours 03/09/20 2037 03/09/20 2144   03/09/20 2200  hydroxychloroquine (PLAQUENIL) tablet 200 mg  Status:  Discontinued     200 mg Oral 2 times daily 03/09/20 2037 03/10/20 0015   03/09/20 2200  ceFAZolin (ANCEF) IVPB 2g/100 mL premix  Status:  Discontinued     2 g 200 mL/hr over 30 Minutes Intravenous Every 8 hours 03/09/20 2144 03/10/20 0624   03/09/20 2015  ceFEPIme (MAXIPIME) 1 g in sodium chloride 0.9 % 100 mL IVPB  Status:   Discontinued     1 g 200 mL/hr over 30 Minutes Intravenous Once 03/09/20 2008 03/09/20 2144   03/09/20 2015  vancomycin (VANCOCIN) IVPB 1000 mg/200 mL premix  Status:  Discontinued     1,000 mg 200 mL/hr over 60 Minutes Intravenous  Once 03/09/20 2008 03/10/20 0117   03/09/20 2015  metroNIDAZOLE (FLAGYL) IVPB 500 mg  Status:  Discontinued     500 mg 100 mL/hr over 60 Minutes Intravenous  Once 03/09/20 2008 03/10/20 0117     Subjective: No clear complaints Confused  Objective: Vitals:   03/14/20 0846 03/14/20 0920 03/14/20 1320 03/14/20 1416  BP: (!) 75/40 113/77 116/72   Pulse: 98  (!) 103   Resp:  20 20   Temp: 98.6 F (37 C)  98.8 F (37.1 C)   TempSrc: Oral  Oral   SpO2: 97%  100% 98%  Weight:      Height:        Intake/Output Summary (Last 24 hours) at  03/14/2020 2027 Last data filed at 03/14/2020 0343 Gross per 24 hour  Intake --  Output 0 ml  Net 0 ml   Filed Weights   03/09/20 1710 03/09/20 2351 03/14/20 0340  Weight: 81.6 kg 88.1 kg 79.7 kg    Examination:  General: No acute distress. Cardiovascular: irregular Lungs: Clear to auscultation bilaterally with good air movement. No rales, rhonchi or wheezes. Abdomen: Soft, nontender, nondistended Neurological: Alert and disoriented. Moves all extremities 4 with equal strength. Cranial nerves II through XII grossly intact. Skin: Warm and dry. No rashes or lesions. Extremities: No clubbing or cyanosis. No edema.     Data Reviewed: I have personally reviewed following labs and imaging studies  CBC: Recent Labs  Lab 03/09/20 1702 03/09/20 1702 03/10/20 0414 03/11/20 0421 03/12/20 0714 03/13/20 0907 03/14/20 0457  WBC 9.3   < > 7.8 6.9 5.5 6.8 7.9  NEUTROABS 7.5  --   --  5.0 4.1 5.0 5.8  HGB 9.4*   < > 9.2* 9.5* 10.0* 10.6* 10.6*  HCT 34.0*   < > 32.4* 33.7* 34.7* 35.8* 34.0*  MCV 87.6   < > 85.7 85.1 84.4 82.3 80.0  PLT 376   < > 272 277 251 268 283   < > = values in this interval not displayed.    Basic Metabolic Panel: Recent Labs  Lab 03/11/20 0421 03/11/20 0421 03/12/20 0714 03/12/20 2141 03/13/20 0907 03/13/20 1113 03/14/20 0455  NA 134*   < > 135 135 129* 133* 136  K 3.9   < > 3.1* 3.0* 3.0* 2.9* 3.4*  CL 95*   < > 87* 84* 79* 80* 82*  CO2 30   < > 36* 36* 36* 41* 38*  GLUCOSE 84   < > 82 70 411* 162* 106*  BUN 18   < > _0 CREATININE 0.52   < > 0.50 0.54 0.62 0.60 0.70  CALCIUM 8.6*   < > 8.5* 8.6* 8.5* 8.6* 8.8*  MG 1.8  --  1.7 1.5* 1.8  --  1.8  PHOS 2.6  --  2.5  --  2.2*  --  2.9   < > = values in this interval not displayed.   GFR: Estimated Creatinine Clearance: 68.7 mL/min (by C-G formula based on SCr of 0.7 mg/dL). Liver Function Tests: Recent Labs  Lab 03/09/20 1817 03/11/20 0421 03/12/20 0714 03/13/20 0907 03/14/20 0455  AST _1 ALT <5 <5 <_2 ALKPHOS 101 80 92 96 94  BILITOT 0.6 0.6 1.0 1.5* 1.5*  PROT 7.4 6.3* 6.5 7.2 6.8  ALBUMIN 2.6* 2.3* 2.4* 2.6* 2.4*   No results for input(s): LIPASE, AMYLASE in the last 168 hours. No results for input(s): AMMONIA in the last 168 hours. Coagulation Profile: No results for input(s): INR, PROTIME in the last 168 hours. Cardiac Enzymes: No results for input(s): CKTOTAL, CKMB, CKMBINDEX, TROPONINI in the last 168 hours. BNP (last 3 results) No results for input(s): PROBNP in the last 8760 hours. HbA1C: No results for input(s): HGBA1C in the last 72 hours. CBG: Recent Labs  Lab 03/13/20 2010 03/13/20 2341 03/14/20 0341 03/14/20 0846 03/14/20 1321  GLUCAP 96 81 102* 116* 117*   Lipid Profile: No results for input(s): CHOL, HDL, LDLCALC, TRIG, CHOLHDL, LDLDIRECT in the last 72 hours. Thyroid Function Tests: Recent Labs    03/13/20 0910  TSH 3.722   Anemia Panel: Recent Labs    03/13/20 0905 **Note De-identified vi Obfusction** 03/13/20 0910  VITMINB12 519  --   FOLTE  --  6.4   Sepsis Lbs: No results for input(s): PROCLCITON, LTICCIDVEN in the lst 168 hours.  Recent Results (from  the pst 240 hour(s))  Body fluid culture (includes grm stin)     Sttus: None   Collection Time: 03/09/20  6:52 PM   Specimen: Pleurl Fluid  Result Vlue Ref Rnge Sttus   Specimen Description   Finl    PLEURL Performed t [facility], [address] [place], [place] [address]    Specil Requests   Finl    PLEURL Performed t [facility], [address] [place], [place] [address]    Grm Stin   Finl    FEW WBC PRESENT, PREDOMINNTLY MONONUCLER NO ORGNISMS SEEN Performed t [facility], [address] [place], [place] [address]    Culture NO GROWTH  Finl   Report Sttus 03/13/2020 FINL  Finl  cid Fst Smer (FB)     Sttus: None   Collection Time: 03/09/20  6:52 PM  Result Vlue Ref Rnge Sttus   FB Specimen Processing Concentrtion  Finl   cid Fst Smer Negtive  Finl    Comment: (NOTE) Performed t: [facility] [address] [place], [place] [id number] Ngendr Snji MD Ph:[phone]    Source (FB) PLEURL  Finl  Respirtory Pnel by RT PCR (Flu &B, Covid) - Nsophryngel Swb     Sttus: None   Collection Time: 03/09/20  8:37 PM   Specimen: Nsophryngel Swb  Result Vlue Ref Rnge Sttus   SRS Coronvirus 2 by RT PCR NEGTIVE NEGTIVE Finl    Comment: (NOTE) SRS-CoV-2 trget nucleic cids re NOT DETECTED. The SRS-CoV-2 RN is generlly detectble in upper respirtoy specimens during the cute phse of infection. The lowest concentrtion of SRS-CoV-2 virl copies this ssy cn detect is 131 copies/mL.  negtive result does not preclude SRS-Cov-2 infection nd should not be used s the sole bsis for tretment or other ptient mngement decisions.  negtive result my occur with  improper specimen collection/hndling, submission of specimen other thn nsophryngel swb, presence of virl muttion(s) within the res trgeted by this ssy, nd indequte number of virl copies (<131  copies/mL).  negtive result must be combined with clinicl observtions, ptient history, nd epidemiologicl informtion. The expected result is Negtive. Fct Sheet for Ptients:  https://www.fd.gov/medi/142436/downlod Fct Sheet for [place] Providers:  https://www.fd.gov/medi/142435/downlod This test is not yet p proved or clered by the [place] FD nd  hs been uthorized for detection nd/or dignosis of SRS-CoV-2 by FD under n Emergency Use uthoriztion (EU). This EU will remin  in effect (mening this test cn be used) for the durtion of the COVID-19 declrtion under Section 564(b)(1) of the ct, 21 U.S.C. section 360bbb-3(b)(1), unless the uthoriztion is terminted or revoked sooner.    Influenz  by PCR NEGTIVE NEGTIVE Finl   Influenz B by PCR NEGTIVE NEGTIVE Finl    Comment: (NOTE) The Xpert Xpress SRS-CoV-2/FLU/RSV ssy is intended s n id in  the dignosis of influenz from Nsophryngel swb specimens nd  should not be used s  sole bsis for tretment. Nsl wshings nd  spirtes re uncceptble for Xpert Xpress SRS-CoV-2/FLU/RSV  testing. Fct Sheet for Ptients: https://www.fd.gov/medi/142436/downlod Fct Sheet for [place] Providers: https://www.fd.gov/medi/142435/downlod This test is not yet pproved or clered by the [place] FD nd  hs been uthorized for detection nd/or dignosis of SRS-CoV-2 by  FD under n Emergency Use uthoriztion (  EUA). This EUA will remain  in effect (meaning this test can be used) for the duration of the  Covid-19 declaration under Section 564(b)(1) of the Act, 21  U.S.C. section 360bbb-3(b)(1), unless the authorization is  terminated or revoked. Performed at [facility], [address] [place], [place] [address]   Culture, blood (routine x 2)     Status: None   Collection Time: 03/09/20  9:31 PM   Specimen: BLOOD  Result Value Ref Range Status    Specimen Description BLOOD LEFT ASSIST CONTROL  Final   Special Requests   Final    BOTTLES DRAWN AEROBIC AND ANAEROBIC Blood Culture adequate volume   Culture   Final    NO GROWTH 5 DAYS Performed at [facility], [address] [place], [place] [address]    Report Status 03/14/2020 FINAL  Final  Culture, blood (routine x 2)     Status: None   Collection Time: 03/09/20  9:45 PM   Specimen: BLOOD  Result Value Ref Range Status   Specimen Description BLOOD RIGHT ASSIST CONTROL  Final   Special Requests   Final    BOTTLES DRAWN AEROBIC AND ANAEROBIC Blood Culture adequate volume   Culture   Final    NO GROWTH 5 DAYS Performed at [facility], [address] [place], [place] [address]    Report Status 03/14/2020 FINAL  Final  MRSA PCR Screening     Status: None   Collection Time: 03/10/20  3:58 PM   Specimen: Nasopharyngeal  Result Value Ref Range Status   MRSA by PCR NEGATIVE NEGATIVE Final    Comment:        The GeneXpert MRSA Assay (FDA approved for NASAL specimens only), is one component of Keidy Thurgood comprehensive MRSA colonization surveillance program. It is not intended to diagnose MRSA infection nor to guide or monitor treatment for MRSA infections. Performed at [facility], [address], [place], [place] [address]          Radiology Studies: CT HEAD WO CONTRAST  Result Date: 03/13/2020 CLINICAL DATA:  Encephalopathy. Additional history provided: Pleural effusion, altered mental status today, positive for TB. EXAM: CT HEAD WITHOUT CONTRAST TECHNIQUE: Contiguous axial images were obtained from the base of the skull through the vertex without intravenous contrast. COMPARISON:  Brain MRI 02/25/2019 FINDINGS: Brain: There is no evidence of acute intracranial hemorrhage, intracranial mass, midline shift or extra-axial fluid collection.No demarcated cortical infarction. Mild ill-defined hypoattenuation within the cerebral white matter is  nonspecific, but consistent with chronic small vessel ischemic disease. Jamar Weatherall chronic pontine infarct was better appreciated on prior MRI 02/25/2019. Stable, mild generalized parenchymal atrophy. Vascular: No hyperdense vessel.  Atherosclerotic calcifications. Skull: Normal. Negative for fracture or focal lesion. Sinuses/Orbits: Visualized orbits demonstrate no acute abnormality. No significant paranasal sinus disease or mastoid effusion at the imaged levels. IMPRESSION: 1. No CT evidence of acute intracranial abnormality. 2. Shantea Poulton known chronic pontine infarct was better appreciated on prior MRI 02/25/2019. 3. Stable mild background generalized parenchymal atrophy and chronic small vessel ischemic disease. Electronically Signed   By: Kellie Simmering DO   On: 03/13/2020 19:39   CT CHEST WO CONTRAST  Result Date: 03/13/2020 CLINICAL DATA:  Abnormal x-ray pleural effusion TB positive EXAM: CT CHEST WITHOUT CONTRAST TECHNIQUE: Multidetector CT imaging of the chest was performed following the standard protocol without IV contrast. COMPARISON:  CT 03/09/2020, chest x-ray 03/13/2020 FINDINGS: Cardiovascular: Limited evaluation without intravenous contrast. Moderate aortic atherosclerosis. Ectatic ascending aorta measuring  up to 3.7 cm. Coronary vascular calcification. Cardiomegaly. No significant pericardial effusion. Small scattered foci of air within the pulmonary arterial trunk and venous system of the mediastinum likely related to line placement. Mediastinum/Nodes: Midline trachea. No thyroid mass. Similar 1 cm low right paratracheal node. Esophagus unremarkable. Lungs/Pleura: Small moderate right pleural effusion with some loculation at the posterior right upper lobe. Overall this is decreased compared to the prior CT. Upper Abdomen: No acute abnormality. Musculoskeletal: No chest wall mass or suspicious bone lesions identified. IMPRESSION: 1. Small moderate right-sided pleural effusion with mild loculation at the posterior  right upper lobe but overall decreased size of the pleural effusion as compared with 03/09/2020. There is improved aeration of right thorax. Residual consolidation in the right lower lobe, atelectasis versus pneumonia. 2. Cardiomegaly Aortic Atherosclerosis (ICD10-I70.0). Electronically Signed   By: Kim  Fujinaga M.D.   On: 03/13/2020 20:41   DG Chest Port 1 View  Result Date: 03/13/2020 CLINICAL DATA:  RIGHT pleural effusion, prior thoracentesis on 03/10/2020, COPD, former smoker, atrial fibrillation EXAM: PORTABLE CHEST 1 VIEW COMPARISON:  Portable exam 0722 hours compared to 03/12/2020 FINDINGS: Rotated to the RIGHT. RIGHT arm PICC line tip projects over SVC. Normal heart size mediastinal contours for degree of rotation. Small RIGHT pleural effusion and RIGHT basilar atelectasis. LEFT lung clear. No definite infiltrate or pneumothorax. Bones demineralized. IMPRESSION: Small RIGHT pleural effusion and RIGHT basilar atelectasis, unchanged. Electronically Signed   By: Mark  Boles M.D.   On: 03/13/2020 08:08        Scheduled Meds: . busPIRone  10 mg Oral TID  . Chlorhexidine Gluconate Cloth  6 each Topical Daily  . feeding supplement (ENSURE ENLIVE)  237 mL Oral BID BM  . guaiFENesin  600 mg Oral BID  . ipratropium-albuterol  3 mL Nebulization TID  . lipase/protease/amylase  36,000 Units Oral TID WC  . magnesium oxide  200 mg Oral BID  . pantoprazole  40 mg Oral Daily  . sertraline  50 mg Oral Daily  . sodium chloride flush  10-40 mL Intracatheter Q12H  . traZODone  50 mg Oral QHS   Continuous Infusions: . sodium chloride 500 mL (03/14/20 0637)  . ceFEPime (MAXIPIME) IV 2 g (03/14/20 1235)  . D-10-0.45% Sodium Chloride with KCL 40 meq/L 1000 ml 30 mL/hr at 03/14/20 0300  . diltiazem (CARDIZEM) infusion 10 mg/hr (03/14/20 1110)  . heparin Stopped (03/13/20 2344)     LOS: 5 days    Time spent: over 30 min    Caldwell Powell, MD [facility]   To contact the attending  provider between 7A-7P or the covering provider during after hours 7P-7A, please log into the web site [email or web address] and access using universal [place] password for that web site. If you do not have the password, please call the hospital operator.  03/14/2020, 8:27 PM    

## 2020-03-14 NOTE — Plan of Care (Signed)
  Problem: Education: Goal: Knowledge of General Education information will improve Description: Including pain rating scale, medication(s)/side effects and non-pharmacologic comfort measures Outcome: Not Progressing   Problem: Health Behavior/Discharge Planning: Goal: Ability to manage health-related needs will improve Outcome: Not Progressing Note: Patient now has a palliative care consult, awaiting evaluation / recommendation(s) from them at this time. Will continue to monitor overall status for the remainder of the shift. Wenda Low [facility]

## 2020-03-14 NOTE — Progress Notes (Addendum)
Brandy Armstrong , Brandy Armstrong changed pts code status to DNR/ NO INTUBATION via phone with  Donella Stade, NP - this RN witness to phone consent

## 2020-03-14 NOTE — Consult Note (Signed)
[place] for a heparin infusion Indication: afib on eliquis PTA  Patient Measurements: Height: 5\' 6"  (167.6 cm) Weight: 175 lb 12.8 oz (79.7 kg) IBW/kg (Calculated) : 59.3 Heparin Dosing Weight: 76.4 kg  Vital Signs: Temp: 98.8 F (37.1 C) (03/16 1320) Temp Source: Oral (03/16 1320) BP: 116/72 (03/16 1320) Pulse Rate: 103 (03/16 1320)  Labs: Recent Labs    03/12/20 0714 03/12/20 0714 03/12/20 2141 03/13/20 0019 03/13/20 0907 03/13/20 0907 03/13/20 1113 03/13/20 1735 03/14/20 0455 03/14/20 0457 03/14/20 1241  HGB 10.0*   < >  --   --  10.6*  --   --   --   --  10.6*  --   HCT 34.7*  --   --   --  35.8*  --   --   --   --  34.0*  --   PLT 251  --   --   --  268  --   --   --   --  283  --   APTT  --    < >  --  50* 85*   < >  --  36 47*  --  100*  HEPARINUNFRC  --   --   --  1.27*  --   --   --   --  1.05*  --   --   CREATININE 0.50   < >   < >  --  0.62  --  0.60  --  0.70  --   --    < > = values in this interval not displayed.    Estimated Creatinine Clearance: 68.7 mL/min (by C-G formula based on SCr of 0.7 mg/dL).   Medical History: Past Medical History:  Diagnosis Date  . A-fib ([place])   . Barretts esophagus   . Chest pain    a. 02/2014 Myoview: Ef 50%, no ischemia.  . Colon polyps   . COPD (chronic obstructive pulmonary disease) ([place])   . Depression with anxiety   . GERD (gastroesophageal reflux disease)   . Hx of benign essential tremor   . Neuropathy   . Permanent atrial fibrillation [facility])    a. s/p ablation 01/2012 in [facility] by Dr. Boyd Kerbs;  b. On sotalol & Xarelto;  c. 02/2014 Echo: EF 50-55%, mild conc LVH, nl LA size/structure;  d. Recurrent afib 8/15 & 08/29/2014.  [name] Rectal fistula   . Rheumatoid arthritis ([place])    On methotrexate and orencia  . Urinary incontinence   . Vitamin D deficiency     Medications:  Scheduled:  . busPIRone  10 mg Oral TID  . Chlorhexidine Gluconate Cloth  6 each Topical Daily   . feeding supplement (ENSURE ENLIVE)  237 mL Oral BID BM  . guaiFENesin  600 mg Oral BID  . hyoscyamine  0.125 mg Oral TID AC  . ipratropium-albuterol  3 mL Nebulization TID  . lipase/protease/amylase  36,000 Units Oral TID WC  . magnesium oxide  200 mg Oral BID  . pantoprazole  40 mg Oral Daily  . sertraline  50 mg Oral Daily  . sodium chloride flush  10-40 mL Intracatheter Q12H  . traZODone  50 mg Oral QHS    Assessment: 74 y.o. femalewith a  history of  COPD, atrial fibrillation on Eliquis. Her last dose of Eliquis was 03/10/20 at 2334. She is being transitioned to a heparin drip in preparation for a possible chest tube placement. She was a patient  at [facility] approximately one year ago where she received IV heparin. Data from that admission was used to guide initial dosing  3/16 0455 aPTT 47 (subtherapeutic), HL 1.05, does not correlate.  Confirmed w/ RN that heparin line was occluded for undetermined amount of time.  CBC stable  Goal of Therapy:  Heparin level 0.3-0.7 units/ml once aPTT and Heparin level correlate.  aPTT 66 - 102 seconds Monitor platelets by anticoagulation protocol: Yes   Plan:   3/16 @ 1241 aPTT 100, upper range of therapeutic. Will re-check level at 2000.   CBC daily while on heparin  Next heparin level with AM labs.   [place] Resident 03/14/2020,1:53 PM

## 2020-03-14 NOTE — Consult Note (Addendum)
Consultation Note Date: 03/14/2020   Patient Name: ALOUISE Armstrong  DOB: 1946/11/12  MRN: [id number]  Age / Sex: 74 y.o., female  PCP: [name], FNP Referring Physician: Elodia Florence., *  Reason for Consultation: Establishing goals of care  HPI/Patient Profile: Brandy Armstrong  is a 74 y.o. Caucasian female with a known history of multiple medical problems that are mentioned below including COPD, atrial fibrillation, on Eliquis, peripheral neuropathy and rheumatoid arthritis, who was recently admitted for MSSA bacteremia of unclear etiology for which she continues to take IV antibiotic therapy through P[facility] line at [facility] where she resides.  She presents to the emergency room with acute onset of worsening dyspnea with hypoxemia to 85% on room air with associated cough occasionally productive of yellowish sputum without fever or chills.   Clinical Assessment and Goals of Care: Patient on respiratory isolation precautions.   Spoke with [place], and then [place] her HPOA's. They both state she lives alone with her dog in a small apartment. She has no spouse or children. They state since a hospitalization 1 year ago for ruptured appendix, she has not been the same as before that time. She began using a walker, and things have progressively become more difficult. Her appetite is poor. She drives, but has a cousin who helps her more now with errands. Brandy Armstrong enjoys her dog, but is unable to walk him now. They state her QOL is declining, and would not be acceptable if she is unable to live alone, and without her dog.    We discussed her diagnosis, prognosis, GOC, EOL wishes disposition and options.  A detailed discussion was had today regarding advanced directives.  Concepts specific to code status, artifical feeding and hydration, IV antibiotics and rehospitalization were  discussed.  The difference between an aggressive medical intervention path and a comfort care path was discussed.  Values and goals of care important to patient and family were attempted to be elicited.  Discussed limitations of medical interventions to prolong quality of life in some situations and discussed the concept of human mortality.  They state they have been kept updated, and understand she is declining. They advise they are all women of faith, and know where is is headed when she dies; a better place. They state they do not want her to suffer, and would not want to prolong her life if it would not be acceptable to her.   They would not want her to be placed on a ventilator if she declines, or to have CPR. They are okay with BIPAP. They would not want a feeding tube. They would like to continue treating the treatable until she is off isolation, and then will consider shifting to comfort care once they can be with her at end of life wherever that occurs.The plan for DNR/DNI was witnessed by Janett Billow, charge RN for the unit with [name], as [name] is the first [place] listed.     SUMMARY OF RECOMMENDATIONS   DNR/DNI. They  would like BIPAP if needed. No feeding tube. Treat the treatable until off isolation, and then HPOA's will consider transition to comfort care so they can be with her when she dies wherever that occurs.       Primary Diagnoses: Present on Admission: **None**   I have reviewed the medical record, interviewed the patient and family, and examined the patient. The following aspects are pertinent.  Past Medical History:  Diagnosis Date  . A-fib ([place])   . Barretts esophagus   . Chest pain    a. 02/2014 Myoview: Ef 50%, no ischemia.  . Colon polyps   . COPD (chronic obstructive pulmonary disease) ([place])   . Depression with anxiety   . GERD (gastroesophageal reflux disease)   . Hx of benign essential tremor   . Neuropathy   . Permanent atrial fibrillation [facility])    a. s/p  ablation 01/2012 in [facility] by Dr. Boyd Kerbs;  b. On sotalol & Xarelto;  c. 02/2014 Echo: EF 50-55%, mild conc LVH, nl LA size/structure;  d. Recurrent afib 8/15 & 08/29/2014.  [name] Rectal fistula   . Rheumatoid arthritis ([place])    On methotrexate and orencia  . Urinary incontinence   . Vitamin D deficiency    Social History   Socioeconomic History  . Marital status: Single    Spouse name: Not on file  . Number of children: 0  . Years of education: 16  . Highest education level: Bachelor's degree (e.g., BA, AB, BS)  Occupational History  . Occupation: Retired  Tobacco Use  . Smoking status: Former Smoker    Packs/day: 1.00    Years: 40.00    Pack years: 40.00    Quit date: 12/16/2011    Years since quitting: 8.2  . Smokeless tobacco: Never Used  Substance and Sexual Activity  . Alcohol use: Not Currently    Alcohol/week: 0.0 standard drinks    Comment: Occasionally has a drink  . Drug use: No  . Sexual activity: Never  Other Topics Concern  . Not on file  Social History Narrative   Brandy Armstrong was born and reared in [place]. She attended [employer] and graduated in 1971 with her Cylinder in Education. 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 She then worked for a [employer] in [place]. She is single. She is currently retired. She loves reading. She loves to spend time with her dog.       Caffeine use: 2 cups coffee per day   2 glasses iced tea/ day   Right-handed   Social Determinants of Health   Financial Resource Strain: Medium Risk  . Difficulty of Paying Living Expenses: Somewhat hard  Food Insecurity: Food Insecurity Present  . Worried About Charity fundraiser in the Last Year: Sometimes true  . Ran Out of Food in the Last Year: Sometimes true  Transportation Needs: No Transportation Needs  . Lack of Transportation (Medical): No  . Lack of Transportation (Non-Medical): No  Physical Activity: Inactive  .  Days of Exercise per Week: 0 days  . Minutes of Exercise per Session: 0 min  Stress: Stress Concern Present  . Feeling of Stress : To some extent  Social Connections: Unknown  . Frequency of Communication with Friends and Family: Not on file  . Frequency of Social Gatherings with Friends and Family: Not on file  . Attends Religious Services: More than 4 times per year  .  Active Member of Clubs or Organizations: No  . Attends Archivist Meetings: Never  . Marital Status: Never married   Family History  Problem Relation Age of Onset  . Depression Mother   . Pancreatic cancer Mother   . Suicidality Mother   . Colon cancer Father   . Breast cancer Sister 61  . Diabetes Brother   . Breast cancer Cousin   . Neuropathy Neg Hx    Scheduled Meds: . busPIRone  10 mg Oral TID  . Chlorhexidine Gluconate Cloth  6 each Topical Daily  . feeding supplement (ENSURE ENLIVE)  237 mL Oral BID BM  . guaiFENesin  600 mg Oral BID  . hyoscyamine  0.125 mg Oral TID AC  . ipratropium-albuterol  3 mL Nebulization TID  . lipase/protease/amylase  36,000 Units Oral TID WC  . magnesium oxide  200 mg Oral BID  . pantoprazole  40 mg Oral Daily  . sertraline  50 mg Oral Daily  . sodium chloride flush  10-40 mL Intracatheter Q12H  . traZODone  50 mg Oral QHS   Continuous Infusions: . sodium chloride 500 mL (03/14/20 0637)  . ceFEPime (MAXIPIME) IV 2 g (03/14/20 1235)  . D-10-0.45% Sodium Chloride with KCL 40 meq/L 1000 ml 30 mL/hr at 03/14/20 0300  . diltiazem (CARDIZEM) infusion 10 mg/hr (03/14/20 1110)  . heparin Stopped (03/13/20 2344)   PRN Meds:.sodium chloride, albuterol, calcium carbonate, chlorpheniramine-HYDROcodone, cyclobenzaprine, hydrOXYzine, metoprolol tartrate, morphine injection, promethazine, sodium chloride flush Medications Prior to Admission:  Prior to Admission medications   Medication Sig Start Date End Date Taking? Authorizing Provider  albuterol (VENTOLIN HFA) 108 (90  Base) MCG/ACT inhaler Inhale 2 puffs into the lungs every 4 (four) hours as needed for wheezing or shortness of breath.   Yes [provider]  busPIRone (BUSPAR) 10 MG tablet Take 1 tablet (10 mg total) by mouth 3 (three) times daily. 01/19/20  Yes Ursula Alert, MD  calcium carbonate (TUMS - DOSED IN MG ELEMENTAL CALCIUM) 500 MG chewable tablet Chew 1 tablet by mouth every 4 (four) hours as needed for indigestion or heartburn.   Yes [provider]  ceFAZolin (ANCEF) IVPB Inject 2 g into the vein every 8 (eight) hours. Indication:  MSSA bacteremia Last Day of Therapy:  03/26/2020 Labs - Once weekly:  CBC/D, CMP, CRP Please pull PIC at completion of IV antibiotics 02/23/20 03/26/20 Yes Fritzi Mandes, MD  CREON 36000 units CPEP capsule Takes 2 capsules three times daily. 10/21/19  Yes [provider]  cyclobenzaprine (FLEXERIL) 5 MG tablet Take 1 tablet (5 mg total) by mouth 3 (three) times daily as needed for muscle spasms. 02/14/20  Yes Arnett, Yvetta Coder, FNP  diltiazem (CARDIZEM CD) 120 MG 24 hr capsule Take 1 capsule (120 mg total) by mouth daily. Patient taking differently: Take 240 mg by mouth daily.  02/23/20  Yes Fritzi Mandes, MD  ELIQUIS 5 MG TABS tablet Take 1 tablet (5 mg total) by mouth 2 (two) times daily. 06/24/19  Yes Minna Merritts, MD  ertapenem [facility]) IVPB Inject 1 g into the vein daily.   Yes [provider]  esomeprazole (NEXIUM) 20 MG capsule TAKE 1 CAPSULE BY MOUTH EVERY DAY 09/29/19  Yes Arnett, Yvetta Coder, FNP  Fluticasone-Umeclidin-Vilant (TRELEGY ELLIPTA) 100-62.5-25 MCG/INH AEPB Inhale 1 puff into the lungs daily. 02/14/20  Yes Tyler Pita, MD  gabapentin (NEURONTIN) 600 MG tablet TAKE 1 TABLET BY MOUTH THREE TIMES A DAY 01/04/20  Yes Mable Paris  G, FNP  Heparin Lock Flush (HEPARIN, PORCINE, LOCK FLUSH IV) Inject into the vein See admin instructions. Flush IV port after IV ABX following saline flush every 8 hours   Yes [provider]  HUMIRA PEN 40 MG/0.4ML PNKT Inject 40 mg as directed. Every 2 weeks 07/07/18  Yes [provider]  hydrochlorothiazide (HYDRODIURIL) 25 MG tablet Take 1 tablet (25 mg total) by mouth daily. Patient taking differently: Take 12.5 mg by mouth daily.  01/20/20 04/19/20 Yes Loel Dubonnet, NP  hydroxychloroquine (PLAQUENIL) 200 MG tablet Take 200 mg by mouth 2 (two) times daily.  06/21/16  Yes [provider]  hydrOXYzine (ATARAX/VISTARIL) 50 MG tablet Take 0.5-1 tablets (25-50 mg total) by mouth daily as needed for anxiety. Patient has supplies Patient taking differently: Take 50 mg by mouth daily as needed for anxiety.  10/27/19  Yes Eappen, Ria Clock, MD  hyoscyamine (LEVSIN) 0.125 MG tablet Take 0.125 mg by mouth 3 (three) times daily before meals.    Yes [provider]  ibuprofen (ADVIL) 400 MG tablet Take 400 mg by mouth every 8 (eight) hours as needed.   Yes [provider]  promethazine (PHENERGAN) 25 MG suppository Place 25 mg rectally every 6 (six) hours as needed for nausea or vomiting.   Yes [provider]  propranolol ER (INDERAL LA) 120 MG 24 hr capsule Take 1 capsule (120 mg total) by mouth daily. 05/27/19  Yes Dunn, Ryan M, PA-C  sertraline (ZOLOFT) 50 MG tablet TAKE 1 TABLET BY MOUTH EVERY DAY 01/18/20  Yes Eappen, Ria Clock, MD  Sodium Chloride Flush (NORMAL SALINE FLUSH) 0.9 % SOLN Inject 10 mLs into the vein. Flush IV port before ABX every 8 hours   Yes [provider]  traZODone (DESYREL) 50 MG tablet Take 1 tablet (50 mg total) by mouth at bedtime. 01/19/20  Yes Ursula Alert, MD   Allergies  Allergen Reactions  . Latex     Itching Itching    Vital Signs: BP 116/72 (BP Location: Left Arm)   Pulse (!) 103   Temp 98.8 F (37.1 C) (Oral)   Resp 20   Ht 5\' 6"  (1.676 m)   Wt 79.7 kg   SpO2 98%   BMI 28.37 kg/m  Pain Scale: PAINAD   Pain Score: 0-No pain   SpO2: SpO2: 98 % O2 Device:SpO2: 98 % O2 Flow  Rate: .O2 Flow Rate (L/min): 1 L/min  IO: Intake/output summary:   Intake/Output Summary (Last 24 hours) at 03/14/2020 1500 Last data filed at 03/14/2020 0343 Gross per 24 hour  Intake --  Output 1500 ml  Net -1500 ml    LBM:   Baseline Weight: Weight: 81.6 kg Most recent weight: Weight: 79.7 kg     Palliative Assessment/Data:    COVID-19 DISASTER DECLARATION:    PHYSICAL EXAMINATION WAS NOT POSSIBLE DUE TO NEED FOR ISOLATION AND CONSERVATION OF PERSONAL PROTECTIVE EQUIPMENT SECONDARY TO COVID.    Time In: 2:00 Time Out: 3:12 Time Total: 72 MIN Greater than 50%  of this time was spent counseling and coordinating care related to the above assessment and plan.  Signed by: Asencion Gowda, NP   Please contact Palliative Medicine Team phone at [phone] for questions and concerns.  For individual provider: See Shea Evans

## 2020-03-14 NOTE — Consult Note (Signed)
[place] for a heparin infusion Indication: afib on eliquis PTA  Patient Measurements: Height: 5\' 6"  (167.6 cm) Weight: 175 lb 12.8 oz (79.7 kg) IBW/kg (Calculated) : 59.3 Heparin Dosing Weight: 76.4 kg  Vital Signs: Temp: 97.5 F (36.4 C) (03/16 0340) Temp Source: Oral (03/16 0340) BP: 134/87 (03/16 0340) Pulse Rate: 110 (03/16 0340)  Labs: Recent Labs    03/12/20 0714 03/12/20 0714 03/12/20 2141 03/13/20 0019 03/13/20 0907 03/13/20 1113 03/13/20 1735 03/14/20 0455 03/14/20 0457  HGB 10.0*   < >  --   --  10.6*  --   --   --  10.6*  HCT 34.7*  --   --   --  35.8*  --   --   --  34.0*  PLT 251  --   --   --  268  --   --   --  283  APTT  --    < >  --  50* 85*  --  36 47*  --   HEPARINUNFRC  --   --   --  1.27*  --   --   --  1.05*  --   CREATININE 0.50   < >   < >  --  0.62 0.60  --  0.70  --    < > = values in this interval not displayed.    Estimated Creatinine Clearance: 68.7 mL/min (by C-G formula based on SCr of 0.7 mg/dL).   Medical History: Past Medical History:  Diagnosis Date  . A-fib ([place])   . Barretts esophagus   . Chest pain    a. 02/2014 Myoview: Ef 50%, no ischemia.  . Colon polyps   . COPD (chronic obstructive pulmonary disease) ([place])   . Depression with anxiety   . GERD (gastroesophageal reflux disease)   . Hx of benign essential tremor   . Neuropathy   . Permanent atrial fibrillation [facility])    a. s/p ablation 01/2012 in [facility] by Dr. Boyd Kerbs;  b. On sotalol & Xarelto;  c. 02/2014 Echo: EF 50-55%, mild conc LVH, nl LA size/structure;  d. Recurrent afib 8/15 & 08/29/2014.  [name] Rectal fistula   . Rheumatoid arthritis ([place])    On methotrexate and orencia  . Urinary incontinence   . Vitamin D deficiency     Medications:  Scheduled:  . busPIRone  10 mg Oral TID  . Chlorhexidine Gluconate Cloth  6 each Topical Daily  . feeding supplement (ENSURE ENLIVE)  237 mL Oral BID BM  . guaiFENesin  600 mg Oral BID   . hyoscyamine  0.125 mg Oral TID AC  . ipratropium-albuterol  3 mL Nebulization TID  . lipase/protease/amylase  36,000 Units Oral TID WC  . magnesium oxide  200 mg Oral BID  . pantoprazole  40 mg Oral Daily  . sertraline  50 mg Oral Daily  . sodium chloride flush  10-40 mL Intracatheter Q12H  . traZODone  50 mg Oral QHS    Assessment: 74 y.o. femalewith a  history of  COPD, atrial fibrillation on Eliquis. Her last dose of Eliquis was 03/10/20 at 2334. She is being transitioned to a heparin drip in preparation for a possible chest tube placement. She was a patient at [facility] approximately one year ago where she received IV heparin. Data from that admission was used to guide initial dosing  3/16 0455 aPTT 47 (subtherapeutic), HL 1.05, does not correlate.  Confirmed w/ RN that heparin line  was occluded for undetermined amount of time.  CBC stable  Goal of Therapy:  Heparin level 0.3-0.7 units/ml once aPTT and Heparin level correlate.  aPTT 66 - 102 seconds Monitor platelets by anticoagulation protocol: Yes   Plan:   aPTT is sub-therapeutic but line was occluded, continue heparin at current rate and recheck aPTT at ~ 12 noon to evaluate.   CBC daily while on heparin  Next heparin level with AM labs.   Hart Robinsons, PharmD Clinical Pharmacist 03/14/2020,6:05 AM

## 2020-03-14 NOTE — Evaluation (Addendum)
Physical Therapy Evaluation Patient Details Name: Brandy Armstrong MRN: [id number] DOB: 05-09-1946 Today's Date: 03/14/2020   History of Present Illness  74 y.o. female, with history of A. fib on anticoagulation, chronic pancreatic insufficiency, chronic back pain COPD, rheumatoid arthritis here with pleural effusion with possible chest tube placement. Recent admission.  Clinical Impression  Pt is a pleasant 74 year old female who was admitted for pleural effusion. Pt performs rolling with mod assist and is unable to further perform mobility efforts. Will need +2 for further mobility efforts due to deficits. Pt demonstrates deficits with strength/cognition/mobility. Pt is currently very confused and is poor historian. Currently on airborne isolation ruling out TB. Would benefit from skilled PT to address above deficits and promote optimal return to PLOF; recommend transition to STR upon discharge from acute hospitalization.     Follow Up Recommendations SNF    Equipment Recommendations  None recommended by PT    Recommendations for Other Services       Precautions / Restrictions Precautions Precautions: Fall Restrictions Weight Bearing Restrictions: No      Mobility  Bed Mobility Overal bed mobility: Needs Assistance Bed Mobility: Rolling Rolling: Mod assist         General bed mobility comments: Pt able to roll to B sides with heavy cues for initiation and hand over hand assist. Has easier time rolling to R side. Also assisted pt in sliding towards HOB with max assist. Pt has difficulty following commands and appears with little use of B UE to assist to pull on rails. Attempted supine->sit transfer with max assist, however unable to perform. Would need +2 for bed mobility.  Transfers                 General transfer comment: unable to perform  Ambulation/Gait                Stairs            Wheelchair Mobility    Modified Rankin (Stroke Patients  Only)       Balance                                             Pertinent Vitals/Pain Pain Assessment: No/denies pain    Home Living Family/patient expects to be discharged to:: Skilled nursing facility Living Arrangements: Alone Available Help at Discharge: Family;Available PRN/intermittently Type of Home: Apartment Home Access: Level entry     Home Layout: One level Home Equipment: Walker - 4 wheels;Walker - 2 wheels;Grab bars - tub/shower;Shower seat Additional Comments: previous hospitalization lived alone, currently has been at SNF until current hospitalization    Prior Function Level of Independence: Independent with assistive device(s)         Comments: per pt chart, was previously independent with AD and driving. Pt currently confused and poor historian, unable to provide accurate history     Hand Dominance        Extremity/Trunk Assessment   Upper Extremity Assessment Upper Extremity Assessment: Generalized weakness;Difficult to assess due to impaired cognition(B UE grip strength 3/5)    Lower Extremity Assessment Lower Extremity Assessment: Generalized weakness(B LE grossly 3+/5)       Communication   Communication: HOH  Cognition Arousal/Alertness: Awake/alert Behavior During Therapy: Flat affect Overall Cognitive Status: Impaired/Different from baseline  General Comments: increased processing time to respond to questions, unable to carry conversation and only oriented to self      General Comments      Exercises Other Exercises Other Exercises: supine ther-ex on B LE including SLRs, hip abd/add, and heel slides. INitially able to participate and only requires min assist, however fatigues quickly and can only perform 8 reps prior to needing max assist.   Assessment/Plan    PT Assessment Patient needs continued PT services  PT Problem List Decreased strength;Decreased activity  tolerance;Decreased balance;Decreased mobility;Decreased cognition;Decreased safety awareness       PT Treatment Interventions Gait training;DME instruction;Functional mobility training;Therapeutic activities;Therapeutic exercise;Balance training;Patient/family education;Neuromuscular re-education    PT Goals (Current goals can be found in the Care Plan section)  Acute Rehab PT Goals Patient Stated Goal: unable to state PT Goal Formulation: Patient unable to participate in goal setting Time For Goal Achievement: 03/28/20 Potential to Achieve Goals: Fair    Frequency Min 2X/week   Barriers to discharge        Co-evaluation               AM-PAC PT "6 Clicks" Mobility  Outcome Measure Help needed turning from your back to your side while in a flat bed without using bedrails?: A Lot Help needed moving from lying on your back to sitting on the side of a flat bed without using bedrails?: Total Help needed moving to and from a bed to a chair (including a wheelchair)?: Total Help needed standing up from a chair using your arms (e.g., wheelchair or bedside chair)?: Total Help needed to walk in hospital room?: Total Help needed climbing 3-5 steps with a railing? : Total 6 Click Score: 7    End of Session Equipment Utilized During Treatment: Oxygen Activity Tolerance: (limited by cognition) Patient left: in bed;with bed alarm set Nurse Communication: Mobility status PT Visit Diagnosis: Muscle weakness (generalized) (M62.81);Difficulty in walking, not elsewhere classified (R26.2)    Time: [id number] PT Time Calculation (min) (ACUTE ONLY): 27 min   Charges:   PT Evaluation $PT Eval Moderate Complexity: 1 Mod PT Treatments $Therapeutic Exercise: 8-22 mins        Brandy Armstrong, PT, DPT [phone]   Brandy Armstrong 03/14/2020, 4:24 PM

## 2020-03-15 ENCOUNTER — Encounter: Payer: Self-pay | Admitting: Family Medicine

## 2020-03-15 ENCOUNTER — Inpatient Hospital Stay: Payer: Medicare Other

## 2020-03-15 DIAGNOSIS — R0902 Hypoxemia: Secondary | ICD-10-CM

## 2020-03-15 DIAGNOSIS — Z515 Encounter for palliative care: Secondary | ICD-10-CM

## 2020-03-15 DIAGNOSIS — Z7189 Other specified counseling: Secondary | ICD-10-CM

## 2020-03-15 LAB — GLUCOSE, CAPILLARY
Glucose-Capillary: 102 mg/dL — ABNORMAL HIGH (ref 70–99)
Glucose-Capillary: 104 mg/dL — ABNORMAL HIGH (ref 70–99)
Glucose-Capillary: 106 mg/dL — ABNORMAL HIGH (ref 70–99)
Glucose-Capillary: 93 mg/dL (ref 70–99)
Glucose-Capillary: 90 mg/dL (ref 70–99)

## 2020-03-15 LAB — CBC WITH DIFFERENTIAL/PLATELET
Abs Immature Granulocytes: 0.04 10*3/uL (ref 0.00–0.07)
Basophils Absolute: 0 10*3/uL (ref 0.0–0.1)
Basophils Relative: 0 %
Eosinophils Absolute: 0.1 10*3/uL (ref 0.0–0.5)
Eosinophils Relative: 2 %
HCT: 34.9 % — ABNORMAL LOW (ref 36.0–46.0)
Hemoglobin: 10.9 g/dL — ABNORMAL LOW (ref 12.0–15.0)
Immature Granulocytes: 1 %
Lymphocytes Relative: 12 %
Lymphs Abs: 0.9 10*3/uL (ref 0.7–4.0)
MCH: 25.1 pg — ABNORMAL LOW (ref 26.0–34.0)
MCHC: 31.2 g/dL (ref 30.0–36.0)
MCV: 80.2 fL (ref 80.0–100.0)
Monocytes Absolute: 0.8 10*3/uL (ref 0.1–1.0)
Monocytes Relative: 11 %
Neutro Abs: 5.3 10*3/uL (ref 1.7–7.7)
Neutrophils Relative %: 74 %
Platelets: 298 10*3/uL (ref 150–400)
RBC: 4.35 MIL/uL (ref 3.87–5.11)
RDW: 20.4 % — ABNORMAL HIGH (ref 11.5–15.5)
WBC: 7.2 10*3/uL (ref 4.0–10.5)
nRBC: 0 % (ref 0.0–0.2)

## 2020-03-15 LAB — COMPREHENSIVE METABOLIC PANEL
ALT: 5 U/L (ref 0–44)
AST: 17 U/L (ref 15–41)
Albumin: 2.4 g/dL — ABNORMAL LOW (ref 3.5–5.0)
Alkaline Phosphatase: 88 U/L (ref 38–126)
Anion gap: 11 (ref 5–15)
BUN: 23 mg/dL (ref 8–23)
CO2: 34 mmol/L — ABNORMAL HIGH (ref 22–32)
Calcium: 8.6 mg/dL — ABNORMAL LOW (ref 8.9–10.3)
Chloride: 90 mmol/L — ABNORMAL LOW (ref 98–111)
Creatinine, Ser: 0.56 mg/dL (ref 0.44–1.00)
GFR calc Af Amer: >60 mL/min (ref >60)
GFR calc non Af Amer: >60 mL/min (ref >60)
Glucose, Bld: 105 mg/dL — ABNORMAL HIGH (ref 70–99)
Potassium: 3.3 mmol/L — ABNORMAL LOW (ref 3.5–5.1)
Sodium: 135 mmol/L (ref 135–145)
Total Bilirubin: 1.4 mg/dL — ABNORMAL HIGH (ref 0.3–1.2)
Total Protein: 6.6 g/dL (ref 6.5–8.1)

## 2020-03-15 LAB — MAGNESIUM: Magnesium: 1.7 mg/dL (ref 1.7–2.4)

## 2020-03-15 LAB — PHOSPHORUS: Phosphorus: 2.6 mg/dL (ref 2.5–4.6)

## 2020-03-15 LAB — APTT
aPTT: 122 seconds — ABNORMAL HIGH (ref 24–36)
aPTT: 107 seconds — ABNORMAL HIGH (ref 24–36)

## 2020-03-15 LAB — HEPARIN LEVEL (UNFRACTIONATED)
Heparin Unfractionated: 1.13 IU/mL — ABNORMAL HIGH (ref 0.30–0.70)
Heparin Unfractionated: 1.1 IU/mL — ABNORMAL HIGH (ref 0.30–0.70)

## 2020-03-15 MED ORDER — POTASSIUM CHLORIDE 10 MEQ/100ML IV SOLN
10.0000 meq | INTRAVENOUS | Status: AC
  Administered 2020-03-15 – 2020-03-16 (×4): 10 meq via INTRAVENOUS
  Filled 2020-03-15 (×4): qty 100

## 2020-03-15 MED ORDER — MAGNESIUM SULFATE 2 GM/50ML IV SOLN
2.0000 g | Freq: Once | INTRAVENOUS | Status: AC
  Administered 2020-03-15: 2 g via INTRAVENOUS
  Filled 2020-03-15: qty 50

## 2020-03-15 MED ORDER — POTASSIUM CHLORIDE 10 MEQ/100ML IV SOLN
10.0000 meq | INTRAVENOUS | Status: DC
  Filled 2020-03-15 (×4): qty 100

## 2020-03-15 MED ORDER — NEPRO/CARBSTEADY PO LIQD
237.0000 mL | Freq: Two times a day (BID) | ORAL | Status: DC
  Administered 2020-03-17 – 2020-03-20 (×7): 237 mL via ORAL

## 2020-03-15 NOTE — Progress Notes (Signed)
Nutrition Follow Up Note   DOCUMENTATION CODES:   Obesity unspecified  INTERVENTION:   Nepro Shake po BID, each supplement provides 425 kcal and 19 grams protein  Magic cup TID with meals, each supplement provides 290 kcal and 9 grams of protein  Pt at refeed risk  NUTRITION DIAGNOSIS:   Inadequate oral intake related to decreased appetite as evidenced by per patient/family report, meal completion < 50%.  GOAL:   Patient will meet greater than or equal to 90% of their needs -not met   MONITOR:   PO intake, Supplement acceptance, Labs, Weight trends, I & O's  ASSESSMENT:   74 year old female with PMHx of GERD, RA, depression, anxiety, Barretts esophagus, vitamin D deficiency, COPD, neuropathy, A-fib admitted with acute hypoxic respiratory failure, right pleural effusion, recent MSSA bacteremia.   Pt continues to have poor appetite and oral intake; pt refusing meals and supplements. Pt changed to nectar thick diet; RD will change Ensure to Nepro. RD will also add Magic Cups to meal trays. Palliative care following; family meeting scheduled for tomorrow. Per palliative care note, pt wound not want a feeding tube.   Medications reviewed and include: creon, Mg oxide, protonix, cefepime, 10% dextrose w/ KCl '@30ml'$ /hr, heparin, MG oxide, KCl  Labs reviewed: K 3.3(L), P 2.6 wnl, Mg 1.7 wnl Hgb 10.9(L), Hct 34.9(L)  Diet Order:   Diet Order            DIET - DYS 1 Room service appropriate? Yes; Fluid consistency: Nectar Thick  Diet effective now             EDUCATION NEEDS:   No education needs have been identified at this time  Skin:  Skin Assessment: Reviewed RN Assessment(ecchymosis)  Last BM:  pta  Height:   Ht Readings from Last 1 Encounters:  03/09/20 '5\' 6"'$  (1.676 m)   Weight:   Wt Readings from Last 1 Encounters:  03/15/20 80.5 kg   Ideal Body Weight:  59.1 kg  BMI:  Body mass index is 28.65 kg/m.  Estimated Nutritional Needs:   Kcal:   1700-2000  Protein:  90-100 grams  Fluid:  1.8-2 L/day  Koleen Distance MS, RD, LDN Contact information available in Amion

## 2020-03-15 NOTE — Care Plan (Addendum)
I have reviewed the patient's films and laboratory data.  As was discussed with Dr.Ayiku.  Quantiferon  gold was negative, cytology showed only inflammation.  AFB smear on the pleural fluid was negative.  CT scan shows significant retained secretions and right lower lobe atelectasis.  Retained secretions likely due to poor cough mechanics patient very debilitated.  Recommend:   Aggressive pulmonary toilet, chest PT  Can discontinue respiratory isolation  Ongoing palliative care conversation  Will likely need SNF   C. Derrill Kay, MD [facility] [facility]  14:44PM addendum: The etiology of the patient's effusion is likely due to complex parapneumonic effusion after MSSA pneumonia.  Continue pulmonary toilet.   Renold Don, MD [place] [facility]

## 2020-03-15 NOTE — Consult Note (Signed)
[place] for a heparin infusion Indication: afib on eliquis PTA  Patient Measurements: Height: 5\' 6"  (167.6 cm) Weight: 177 lb 8 oz (80.5 kg) IBW/kg (Calculated) : 59.3 Heparin Dosing Weight: 76 kg  Vital Signs: Temp: 98.8 F (37.1 C) (03/17 1622) Temp Source: Oral (03/17 1622) BP: 111/75 (03/17 1622) Pulse Rate: 104 (03/17 1622)  Labs: Recent Labs    03/13/20 0907 03/13/20 0907 03/13/20 1113 03/13/20 1735 03/14/20 0455 03/14/20 0457 03/14/20 1241 03/14/20 1951 03/15/20 0550 03/15/20 1607  HGB 10.6*   < >  --   --   --  10.6*  --   --  10.9*  --   HCT 35.8*  --   --   --   --  34.0*  --   --  34.9*  --   PLT 268  --   --   --   --  283  --   --  298  --   APTT 85*  --   --    < > 47*  --    < > 83* 122* 107*  HEPARINUNFRC  --   --   --   --  1.05*  --   --   --  1.13* 1.10*  CREATININE 0.62   < > 0.60  --  0.70  --   --   --  0.56  --    < > = values in this interval not displayed.    Estimated Creatinine Clearance: 69 mL/min (by C-G formula based on SCr of 0.56 mg/dL).    Assessment: 74 y.o. femalewith a  history of  COPD, atrial fibrillation on Eliquis. Her last dose of Eliquis was 03/10/20 at 2334. She is being transitioned to a heparin drip in preparation for a possible chest tube placement. She was a patient at [facility] approximately one year ago where she received IV heparin. Data from that admission was used to guide initial dosing   Goal of Therapy:  Heparin level 0.3-0.7 units/ml once aPTT and Heparin level correlate.  aPTT 66 - 102 seconds Monitor platelets by anticoagulation protocol: Yes   Plan:   3/17 @ 1607 aPTT 107 and HL 1.10, supratherapeutic, trending down  RN confirms heparin drip running at 11.5 ml/hr; no s/sx of bleeding noted  Decrease heparin drip to 1050 units/hr  Re-check aPTT 8 hours after rate change, HL in AM  H&H, platelets stable. CBC daily while on heparin  Pharmacy will continue to  follow.  Rayna Sexton, PharmD, BCPS Clinical Pharmacist 03/15/2020 5:13 PM

## 2020-03-15 NOTE — Progress Notes (Signed)
Physical Therapy Treatment Patient Details Name: Brandy Armstrong MRN: [id number] DOB: September 25, 1946 Today's Date: 03/15/2020    History of Present Illness 74 y.o. female, with history of A. fib on anticoagulation, chronic pancreatic insufficiency, chronic back pain COPD, rheumatoid arthritis here with pleural effusion with possible chest tube placement. Recent admission.    PT Comments    Pt is making limited progress towards goals with ability to perform bed mobility this date and come to EOB. Several attempts to stand, however unable to lift buttocks off bed. Pt fatigues quickly and needs heavy +2 assist for all mobility attempts in addition to scooting up towards [place]. All mobility performed on O2 sats. Continues to be on airborne precautions. Pt remains confused with decreased carry over. Will continue to progress.   Follow Up Recommendations  SNF     Equipment Recommendations  None recommended by PT    Recommendations for Other Services       Precautions / Restrictions Precautions Precautions: Fall;Other (comment) Restrictions Weight Bearing Restrictions: No    Mobility  Bed Mobility Overal bed mobility: Needs Assistance Bed Mobility: Rolling;Supine to Sit;Sit to Supine Rolling: Mod assist   Supine to sit: Max assist;+2 for physical assistance Sit to supine: Max assist;+2 for physical assistance   General bed mobility comments: needs assist for initiating with B LEs. heavy assist for trunk positioning and scooting out towards EOB. Once seated at EOB, able to decrease physical assist and maintain upright posture with cga.   Transfers Overall transfer level: Needs assistance Equipment used: Rolling walker (2 wheeled) Transfers: Sit to/from Stand Sit to Stand: Max assist;+2 safety/equipment;+2 physical assistance         General transfer comment: transfers attempted twice however unable to achieve buttock clearance for full attempt. Pt very fearful and resists momentum to  stand despite education and cues. Further transfers deferred  Ambulation/Gait             General Gait Details: unable to perform at this time   Stairs             Wheelchair Mobility    Modified Rankin (Stroke Patients Only)       Balance Overall balance assessment: Needs assistance Sitting-balance support: Bilateral upper extremity supported;Feet supported Sitting balance-Leahy Scale: Good Sitting balance - Comments: Patient able to tolerate sitting EOB with B UE support and SBA.       Standing balance comment: unable to attempt                            Cognition Arousal/Alertness: Awake/alert Behavior During Therapy: Flat affect Overall Cognitive Status: Impaired/Different from baseline                                 General Comments: unable to carry conversation, answers with one words answers. Appears fearful of mobility      Exercises Other Exercises Other Exercises: Attempted to perform simple grooming tasks at bed level with hand over hand assistance Other Exercises: Partial co-tx with PT for safety.  Performing bed mobility with MOD A for log rolling and MOD/MAX A x 2 to reach EOB.  ABle to tolerated sitting EOB for ~7 minutes.  Attempted sit to stand with 2 trials and no success.    General Comments        Pertinent Vitals/Pain Pain Assessment: No/denies pain    Home Living Family/patient  expects to be discharged to:: Skilled nursing facility Living Arrangements: Alone Available Help at Discharge: Family;Available PRN/intermittently Type of Home: Apartment Home Access: Level entry   Home Layout: One level Home Equipment: Walker - 4 wheels;Walker - 2 wheels;Grab bars - tub/shower;Shower seat Additional Comments: previous hospitalization lived alone, currently has been at SNF until current hospitalization    Prior Function Level of Independence: Independent with assistive device(s)      Comments: Unable to  provide history at time of eval secondary to confusion.  Per chart, I with ADLs.   PT Goals (current goals can now be found in the care plan section) Acute Rehab PT Goals Patient Stated Goal: unable to state PT Goal Formulation: Patient unable to participate in goal setting Time For Goal Achievement: 03/28/20 Potential to Achieve Goals: Fair Progress towards PT goals: Progressing toward goals    Frequency    Min 2X/week      PT Plan Current plan remains appropriate    Co-evaluation              AM-PAC PT "6 Clicks" Mobility   Outcome Measure  Help needed turning from your back to your side while in a flat bed without using bedrails?: A Lot Help needed moving from lying on your back to sitting on the side of a flat bed without using bedrails?: A Lot Help needed moving to and from a bed to a chair (including a wheelchair)?: Total Help needed standing up from a chair using your arms (e.g., wheelchair or bedside chair)?: Total Help needed to walk in hospital room?: Total Help needed climbing 3-5 steps with a railing? : Total 6 Click Score: 8    End of Session Equipment Utilized During Treatment: Oxygen;Gait belt Activity Tolerance: Patient tolerated treatment well Patient left: in bed;with bed alarm set Nurse Communication: Mobility status PT Visit Diagnosis: Muscle weakness (generalized) (M62.81);Difficulty in walking, not elsewhere classified (R26.2)     Time: [id number] PT Time Calculation (min) (ACUTE ONLY): 25 min  Charges:  $Therapeutic Activity: 8-22 mins                     Greggory Stallion, PT, DPT [phone]    Barb Shear 03/15/2020, 3:06 PM

## 2020-03-15 NOTE — Evaluation (Signed)
Occupational Therapy Evaluation Patient Details Name: Brandy Armstrong MRN: [id number] DOB: [birth year] Today's Date: 03/15/2020    History of Present Illness 74 y.o. female, with history of A. fib on anticoagulation, chronic pancreatic insufficiency, chronic back pain COPD, rheumatoid arthritis here with pleural effusion with possible chest tube placement. Recent admission.   Clinical Impression   Approached patient for OT evaluation this date.  Patient recently discharged from [facility] and went to SNF.  Came back to hospital from SNF due to pleural effusion.  Patient presents with decreased cognition and has difficulty voicing needs.  However, patient is able to state she has no pain and is having a good day.  Attempted to participate in simple self care tasks with hand over hand assist.  Partial co-tx with PT this date for optimal safety.  Completed log rolling with hand over hand assist for initiation and MOD A.  Moved to EOB with MOD/MAX A x 2.  Patient tolerated sitting at EOB with fair balance.  Attempted sit to stand with 2 trials and no success.  Repositioned patient back in bed for safety and comfort.  BP at 134/74, 99% 02 on RA, 104HR.  Noted patient is being followed by palliative care and will remain updated.  Patient would benefit from skilled OT at this time to address strengthening, reduce pain and improve independence with functional transfers and BADLs in order to reduce caregiver burden.  Recommending SNF follow up at this time pending palliative care.    Follow Up Recommendations  SNF    Equipment Recommendations  Other (comment)(defer to next level of care)    Recommendations for Other Services       Precautions / Restrictions Precautions Precautions: Fall;Other (comment)(airborne isolation room/precautions) Restrictions Weight Bearing Restrictions: No      Mobility Bed Mobility Overal bed mobility: Needs Assistance Bed Mobility: Rolling;Supine to Sit;Sit to  Supine Rolling: Mod assist   Supine to sit: Mod assist;Max assist;+2 for physical assistance;+2 for safety/equipment;HOB elevated Sit to supine: Max assist   General bed mobility comments: performed log rolling with MOD A.  Required hand over hand assist for sequencing and initiation of task.  Transitioned to EOB with HOB elevated with MAX A for LB and initially MAX A for UB (x2) but once participating, required MOD A.  ABle to tolerate sitting at EOB for ~5-6 minutes with good balance.  Transfers Overall transfer level: Needs assistance   Transfers: Sit to/from Stand           General transfer comment: Attempted sit<>stand with OT/PT, unable to complete.  Requiring hand over hand assist for sequencing and body mechanics.    Balance Overall balance assessment: Needs assistance Sitting-balance support: Bilateral upper extremity supported;Feet supported Sitting balance-Leahy Scale: Fair Sitting balance - Comments: Patient able to tolerate sitting EOB with B UE support and SBA.                                   ADL either performed or assessed with clinical judgement   ADL Overall ADL's : Needs assistance/impaired                                       General ADL Comments: Attempted to complete simple grooming tasks at bed level.  Pt showing no initiation of task but will allow hand over hand  assistance.     Vision Patient Visual Report: Other (comment)(Unable to assess due to cognitive deficits)       Perception     Praxis      Pertinent Vitals/Pain Pain Assessment: No/denies pain     Hand Dominance Right   Extremity/Trunk Assessment Upper Extremity Assessment Upper Extremity Assessment: Generalized weakness;Difficult to assess due to impaired cognition   Lower Extremity Assessment Lower Extremity Assessment: Defer to PT evaluation   Cervical / Trunk Assessment Cervical / Trunk Assessment: Normal   Communication  Communication Communication: HOH   Cognition Arousal/Alertness: Awake/alert Behavior During Therapy: Flat affect Overall Cognitive Status: Impaired/Different from baseline                                 General Comments: Patient with limited verbal responses to questions or conversation.  Required increased processing time.  Oriented to self.   General Comments       Exercises Other Exercises Other Exercises: Attempted to perform simple grooming tasks at bed level with hand over hand assistance Other Exercises: Partial co-tx with PT for safety.  Performing bed mobility with MOD A for log rolling and MOD/MAX A x 2 to reach EOB.  ABle to tolerated sitting EOB for ~6 minutes.  Attempted sit to stand with 2 trials and no success.   Shoulder Instructions      Home Living Family/patient expects to be discharged to:: Skilled nursing facility Living Arrangements: Alone Available Help at Discharge: Family;Available PRN/intermittently Type of Home: Apartment Home Access: Level entry     Home Layout: One level     Bathroom Shower/Tub: Walk-in shower         Home Equipment: Environmental consultant - 4 wheels;Walker - 2 wheels;Grab bars - tub/shower;Shower seat   Additional Comments: previous hospitalization lived alone, currently has been at SNF until current hospitalization      Prior Functioning/Environment Level of Independence: Independent with assistive device(s)        Comments: Unable to provide history at time of eval secondary to confusion.  Per chart, I with ADLs.        OT Problem List: Decreased strength;Decreased activity tolerance;Decreased cognition;Decreased coordination;Decreased safety awareness      OT Treatment/Interventions: Self-care/ADL training;Therapeutic exercise;Energy conservation;Therapeutic activities;Patient/family education    OT Goals(Current goals can be found in the care plan section) Acute Rehab OT Goals Patient Stated Goal: unable to  state OT Goal Formulation: With patient Time For Goal Achievement: 03/29/20 Potential to Achieve Goals: Fair  OT Frequency: Min 1X/week   Barriers to D/C:            Co-evaluation              AM-PAC OT "6 Clicks" Daily Activity     Outcome Measure Help from another person eating meals?: A Lot Help from another person taking care of personal grooming?: A Lot Help from another person toileting, which includes using toliet, bedpan, or urinal?: A Lot Help from another person bathing (including washing, rinsing, drying)?: A Lot Help from another person to put on and taking off regular upper body clothing?: A Lot Help from another person to put on and taking off regular lower body clothing?: A Lot 6 Click Score: 12   End of Session Equipment Utilized During Treatment: Gait belt;Rolling walker;Oxygen Nurse Communication: Other (comment)(spoke with NP about appropriateness of therapy evaluation)  Activity Tolerance: No increased pain;Other (comment);Patient tolerated treatment well(Limited due to  cognitive deficits) Patient left: in bed;with call bell/phone within reach;with bed alarm set;with nursing/sitter in room  OT Visit Diagnosis: Muscle weakness (generalized) (M62.81)                Time: [id number] OT Time Calculation (min): 39 min Charges:  OT General Charges $OT Visit: 1 Visit OT Evaluation $OT Eval Moderate Complexity: 1 Mod OT Treatments $Therapeutic Activity: 8-22 mins  Baldomero Lamy, MS, OTR/L 03/15/20, 12:41 PM

## 2020-03-15 NOTE — Progress Notes (Signed)
Progress Note    Brandy Armstrong  [id number] DOB: Jan 31, 1946  DOA: 03/09/2020 PCP: [name], FNP      Brief Narrative:    Medical records reviewed and are as summarized below:  Brandy Armstrong is an 74 y.o. female with a known history of multiple medical problems that are mentioned below including COPD, atrial fibrillation, on Eliquis, peripheral neuropathy and rheumatoid arthritis, who was recently admitted for MSSA bacteremia of unclear etiology for which she continues to take IV antibiotic therapy through PICC line at [facility] facility where she resides.  She presents to the emergency room with acute onset of worsening dyspnea with hypoxemia to 85% on room air with associated cough occasionally productive of yellowish sputum without fever or chills. The patient underwent ultrasound-guided right thoracentesis in the ER with drainage of 1 L of pleural fluid.      Assessment/Plan:   Active Problems:   Pleural effusion  Exudative Right Pleural Effusion  Acute Hypoxic Respiratory Failure:  Currently requiring 2 L [place] S/p thoracentesis in ED with 1 L serosanguinous fluid removed on 3/11 3/12 with thoracentesis by IR with 1.2 L bloody pleural fluid -> c/b R basilar ex vacuo hydropneumothorax Repeat CXR 3/12 with interval reduction of R sided pleural effusion -> tiny R basilar ex vacuo hydropneumothorax.  Improved aeration of the R mid and lower lung with persistent R medial basilar heterogeneous/consolidative opacities (atelectasis vs infiltrate) CXR 3/13 with mild interval worsening in lung aeration on the R consistent with increase in R pleural fluid and atelectasis CXR 3/14 with moderate layering R pleural effusion with R lower lobe atelectasis CXR 3/15 with small R effusion and R basilar atelectasis CT chest on 3/15 notable for small R sided effusion with mild loculation at posterior R upper lobe (overall decreased in size) - residual  consolidation, atelectasis vs pneumonia. Exudative by lights No malignancy on pleural fluid cytology No growth from pleural fluid culture quant gold (negative), AFB smear from pleural fluid is negative (AFB cx is pending). Discussed with Dr. Patsey Berthold. Ok to discontinue airborne isolation. Hold plaquenil and adalimumab Continue IV Cefepime Blood cx NGTD    Delirium: persistently confused.  Likely 2/2 hypoxia and hospitalization.  Per family, she's been declining from mental status standpoint (and in general) over the past years and has been confused when hospitalized before. Follow TSH (wnl), B12 (wnl), folate (wnl), VBG (without hypercarbia) and head CT (without acute abnormality) She has poor PO intake and is not taking meds   Recent MSSA bacteremia. -We will continue IV Ancef that she supposed to take till 03/26/2020. (switch to cefepime for now to cover for pneumonia -> consider 7 day course of cefepime, then transition bact to ancef?) -The patient has finished a course of IV Invanz on 03/08/2020 (will need to review this, I don't see this clearly? Family member did acknowledge she was treated with abx at [facility])   COPD. -She does not seem to have any current acute flare. -She will be placed on duo nebs as mentioned above.   Rheumatoid arthritis. -We willholdPlaquenilgiven prolonged QT interval. - Repeat EKG -> improved qtc   Hypertension. -We will continue HCTZ and Cardizem CD.   Atrial fibrillation with mildly rapid ventricular response, likely secondary to #1. - Hold eliquis in preparation for procedure.  Heparin and dilt gtt (pt not taking PO well).   Anxiety. -We will continue BuSpar.  8. DVT prophylaxis. -eliquis on hold  # Hypokalemia: replace  and follow      Body mass index is 28.65 kg/m.   Family Communication/Anticipated D/C date and plan/Code Status   DVT prophylaxis: SCD Code Status: Use BiPAP but no CPR. DNI Family Communication:  None Disposition Plan: Patient is from SNF. Plamn to discharge to SNF when cleared by pulmonologist for discharge.      Subjective:   Patient is unable to provide any history  Objective:    Vitals:   03/15/20 1152 03/15/20 1622 03/15/20 1922 03/15/20 1953  BP: (!) 147/74 111/75 120/67   Pulse: (!) 107 (!) 104 99   Resp: 19 20 20    Temp: 98.8 F (37.1 C) 98.8 F (37.1 C) 98 F (36.7 C)   TempSrc: Oral Oral    SpO2: 97% 94% 94% 94%  Weight:      Height:        Intake/Output Summary (Last 24 hours) at 03/15/2020 2113 Last data filed at 03/15/2020 1831 Gross per 24 hour  Intake 2636.2 ml  Output 550 ml  Net 2086.2 ml   Filed Weights   03/09/20 2351 03/14/20 0340 03/15/20 0437  Weight: 88.1 kg 79.7 kg 80.5 kg    Exam:  GEN: NAD SKIN: Warm and dry EYES: EOMI ENT: MMM CV: RRR PULM: CTA B ABD: soft, ND, NT, +BS CNS: Alert but confused. She doesn't follow commands. EXT: No edema or tenderness   Data Reviewed:   I have personally reviewed following labs and imaging studies:  Labs: Labs show the following:   Basic Metabolic Panel: Recent Labs  Lab 03/11/20 0421 03/11/20 0421 03/12/20 0714 03/12/20 0714 03/12/20 2141 03/12/20 2141 03/13/20 0907 03/13/20 0907 03/13/20 1113 03/13/20 1113 03/14/20 0455 03/15/20 0550  NA 134*   < > 135   < > 135  --  129*  --  133*  --  136 135  K 3.9   < > 3.1*   < > 3.0*   < > 3.0*   < > 2.9*   < > 3.4* 3.3*  CL 95*   < > 87*   < > 84*  --  79*  --  80*  --  82* 90*  CO2 30   < > 36*   < > 36*  --  36*  --  41*  --  38* 34*  GLUCOSE 84   < > 82   < > 70  --  411*  --  162*  --  106* 105*  BUN 18   < > 14   < > 15  --  16  --  17  --  22 23  CREATININE 0.52   < > 0.50   < > 0.54  --  0.62  --  0.60  --  0.70 0.56  CALCIUM 8.6*   < > 8.5*   < > 8.6*  --  8.5*  --  8.6*  --  8.8* 8.6*  MG 1.8   < > 1.7  --  1.5*  --  1.8  --   --   --  1.8 1.7  PHOS 2.6  --  2.5  --   --   --  2.2*  --   --   --  2.9 2.6   < > =  values in this interval not displayed.   GFR Estimated Creatinine Clearance: 69 mL/min (by C-G formula based on SCr of 0.56 mg/dL). Liver Function Tests: Recent Labs  Lab 03/11/20 0421 03/12/20 [id number] 03/13/20 [id number]  03/14/20 0455 03/15/20 0550  AST 16 19 19 19 17   ALT <5 5 6 6 5   ALKPHOS 80 92 96 94 88  BILITOT 0.6 1.0 1.5* 1.5* 1.4*  PROT 6.3* 6.5 7.2 6.8 6.6  ALBUMIN 2.3* 2.4* 2.6* 2.4* 2.4*   No results for input(s): LIPASE, AMYLASE in the last 168 hours. No results for input(s): AMMONIA in the last 168 hours. Coagulation profile No results for input(s): INR, PROTIME in the last 168 hours.  CBC: Recent Labs  Lab 03/11/20 0421 03/12/20 0714 03/13/20 0907 03/14/20 0457 03/15/20 0550  WBC 6.9 5.5 6.8 7.9 7.2  NEUTROABS 5.0 4.1 5.0 5.8 5.3  HGB 9.5* 10.0* 10.6* 10.6* 10.9*  HCT 33.7* 34.7* 35.8* 34.0* 34.9*  MCV 85.1 84.4 82.3 80.0 80.2  PLT 277 251 268 283 298   Cardiac Enzymes: No results for input(s): CKTOTAL, CKMB, CKMBINDEX, TROPONINI in the last 168 hours. BNP (last 3 results) No results for input(s): PROBNP in the last 8760 hours. CBG: Recent Labs  Lab 03/15/20 0439 03/15/20 0814 03/15/20 1152 03/15/20 1623 03/15/20 2052  GLUCAP 102* 104* 106* 93 90   D-Dimer: No results for input(s): DDIMER in the last 72 hours. Hgb A1c: No results for input(s): HGBA1C in the last 72 hours. Lipid Profile: No results for input(s): CHOL, HDL, LDLCALC, TRIG, CHOLHDL, LDLDIRECT in the last 72 hours. Thyroid function studies: Recent Labs    03/13/20 0910  TSH 3.722   Anemia work up: Recent Labs    03/13/20 0905 03/13/20 0910  VITAMINB12 519  --   FOLATE  --  6.4   Sepsis Labs: Recent Labs  Lab 03/12/20 0714 03/13/20 0907 03/14/20 0457 03/15/20 0550  WBC 5.5 6.8 7.9 7.2    Microbiology Recent Results (from the past 240 hour(s))  Body fluid culture (includes gram stain)     Status: None   Collection Time: 03/09/20  6:52 PM   Specimen: Pleural Fluid   Result Value Ref Range Status   Specimen Description   Final    PLEURAL Performed at [facility], [address], [place], [place] [address]    Special Requests   Final    PLEURAL Performed at [facility], [address] [place], [place] [address]    Gram Stain   Final    FEW WBC PRESENT, PREDOMINANTLY MONONUCLEAR NO ORGANISMS SEEN Performed at [facility], [place] [address], [place], [place] [address]    Culture NO GROWTH  Final   Report Status 03/13/2020 FINAL  Final  Acid Fast Smear (AFB)     Status: None   Collection Time: 03/09/20  6:52 PM  Result Value Ref Range Status   AFB Specimen Processing Concentration  Final   Acid Fast Smear Negative  Final    Comment: (NOTE) Performed At: [facility] [place], [place] [id number] Rush Farmer MD [id number]    Source (AFB) PLEURAL  Final  Respiratory Panel by RT PCR (Flu A&B, Covid) - Nasopharyngeal Swab     Status: None   Collection Time: 03/09/20  8:37 PM   Specimen: Nasopharyngeal Swab  Result Value Ref Range Status   SARS Coronavirus 2 by RT PCR NEGATIVE NEGATIVE Final    Comment: (NOTE) SARS-CoV-2 target nucleic acids are NOT DETECTED. The SARS-CoV-2 RNA is generally detectable in upper respiratoy specimens during the acute phase of infection. The lowest concentration of SARS-CoV-2 viral copies this assay can detect is 131 copies/mL. A negative result does not preclude  SARS-Cov-2 infection and should not be used as the sole basis for treatment or other patient management decisions. A negative result may occur with  improper specimen collection/handling, submission of specimen other than nasopharyngeal swab, presence of viral mutation(s) within the areas targeted by this assay, and inadequate number of viral copies (<131 copies/mL). A negative result must be combined with clinical observations, patient history, and epidemiological information. The expected result is  Negative. Fact Sheet for Patients:  PinkCheek.be Fact Sheet for Healthcare Providers:  GravelBags.it This test is not yet ap proved or cleared by the [place] FDA and  has been authorized for detection and/or diagnosis of SARS-CoV-2 by FDA under an Emergency Use Authorization (EUA). This EUA will remain  in effect (meaning this test can be used) for the duration of the COVID-19 declaration under Section 564(b)(1) of the Act, 21 U.S.C. section 360bbb-3(b)(1), unless the authorization is terminated or revoked sooner.    Influenza A by PCR NEGATIVE NEGATIVE Final   Influenza B by PCR NEGATIVE NEGATIVE Final    Comment: (NOTE) The Xpert Xpress SARS-CoV-2/FLU/RSV assay is intended as an aid in  the diagnosis of influenza from Nasopharyngeal swab specimens and  should not be used as a sole basis for treatment. Nasal washings and  aspirates are unacceptable for Xpert Xpress SARS-CoV-2/FLU/RSV  testing. Fact Sheet for Patients: PinkCheek.be Fact Sheet for Healthcare Providers: GravelBags.it This test is not yet approved or cleared by the [place] FDA and  has been authorized for detection and/or diagnosis of SARS-CoV-2 by  FDA under an Emergency Use Authorization (EUA). This EUA will remain  in effect (meaning this test can be used) for the duration of the  Covid-19 declaration under Section 564(b)(1) of the Act, 21  U.S.C. section 360bbb-3(b)(1), unless the authorization is  terminated or revoked. Performed at [facility], [address] [place], [place] [address]   Culture, blood (routine x 2)     Status: None   Collection Time: 03/09/20  9:31 PM   Specimen: BLOOD  Result Value Ref Range Status   Specimen Description BLOOD LEFT ASSIST CONTROL  Final   Special Requests   Final    BOTTLES DRAWN AEROBIC AND ANAEROBIC Blood Culture adequate volume    Culture   Final    NO GROWTH 5 DAYS Performed at [facility], [address] [place], [place] [address]    Report Status 03/14/2020 FINAL  Final  Culture, blood (routine x 2)     Status: None   Collection Time: 03/09/20  9:45 PM   Specimen: BLOOD  Result Value Ref Range Status   Specimen Description BLOOD RIGHT ASSIST CONTROL  Final   Special Requests   Final    BOTTLES DRAWN AEROBIC AND ANAEROBIC Blood Culture adequate volume   Culture   Final    NO GROWTH 5 DAYS Performed at [facility], [address] [place], [place] [address]    Report Status 03/14/2020 FINAL  Final  MRSA PCR Screening     Status: None   Collection Time: 03/10/20  3:58 PM   Specimen: Nasopharyngeal  Result Value Ref Range Status   MRSA by PCR NEGATIVE NEGATIVE Final    Comment:        The GeneXpert MRSA Assay (FDA approved for NASAL specimens only), is one component of a comprehensive MRSA colonization surveillance program. It is not intended to diagnose MRSA infection nor to guide or monitor treatment for MRSA infections. Performed at [facility],  [place], [place] [address]     Procedures and diagnostic studies:  DG Chest Port 1 View  Result Date: 03/15/2020 CLINICAL DATA:  Shortness of breath. EXAM: PORTABLE CHEST 1 VIEW COMPARISON:  03/13/2020 FINDINGS: 0702 hours. Low lung volumes with persistent right base collapse/consolidation. Known loculated right pleural fluid collection as noted on CT scan 2 days ago. The cardio pericardial silhouette is enlarged and accentuated by rightward patient rotation on the current study. Right PICC line remains in place. The visualized bony structures of the thorax are intact. Telemetry leads overlie the chest. IMPRESSION: Stable.  Right base collapse/consolidation with cardiomegaly. Electronically Signed   By: Misty Stanley M.D.   On: 03/15/2020 09:50    Medications:   . busPIRone  10 mg Oral TID  .  Chlorhexidine Gluconate Cloth  6 each Topical Daily  . feeding supplement (NEPRO CARB STEADY)  237 mL Oral BID BM  . guaiFENesin  600 mg Oral BID  . ipratropium-albuterol  3 mL Nebulization TID  . lipase/protease/amylase  36,000 Units Oral TID WC  . magnesium oxide  200 mg Oral BID  . pantoprazole  40 mg Oral Daily  . sertraline  50 mg Oral Daily  . sodium chloride flush  10-40 mL Intracatheter Q12H  . traZODone  50 mg Oral QHS   Continuous Infusions: . sodium chloride Stopped (03/15/20 0856)  . ceFEPime (MAXIPIME) IV Stopped (03/15/20 1817)  . D-10-0.45% Sodium Chloride with KCL 40 meq/L 1000 ml 30 mL/hr at 03/15/20 1823  . diltiazem (CARDIZEM) infusion 10 mg/hr (03/15/20 1823)  . heparin 1,050 Units/hr (03/15/20 1831)  . potassium chloride 10 mEq (03/15/20 1915)     LOS: 6 days   Shivank Pinedo  [facility]     03/15/2020, 9:13 PM

## 2020-03-15 NOTE — Progress Notes (Signed)
Daily Progress Note   Patient Name: Brandy Armstrong       Date: 03/15/2020 DOB: 02/01/1946  Age: 74 y.o. MRN#: [id number] Attending Physician: Jennye Boroughs, MD Primary Care Physician: [name], FNP Admit Date: 03/09/2020  Reason for Consultation/Follow-up: Establishing goals of care  Subjective: Patient is confused. Per staff, her appetite is very poor. Spoke with pulmonology, ID RN, primary RN, and messaged attending. Discussion of lifting airborne precautions. Plans for family meeting tomorrow at 1:00 at bedside. Questions today from [facility] regarding hospice facility.    Length of Stay: 6  Current Medications: Scheduled Meds:  . busPIRone  10 mg Oral TID  . Chlorhexidine Gluconate Cloth  6 each Topical Daily  . feeding supplement (ENSURE ENLIVE)  237 mL Oral BID BM  . guaiFENesin  600 mg Oral BID  . ipratropium-albuterol  3 mL Nebulization TID  . lipase/protease/amylase  36,000 Units Oral TID WC  . magnesium oxide  200 mg Oral BID  . pantoprazole  40 mg Oral Daily  . sertraline  50 mg Oral Daily  . sodium chloride flush  10-40 mL Intracatheter Q12H  . traZODone  50 mg Oral QHS    Continuous Infusions: . sodium chloride 250 mL (03/15/20 0628)  . ceFEPime (MAXIPIME) IV 2 g (03/15/20 0629)  . D-10-0.45% Sodium Chloride with KCL 40 meq/L 1000 ml 30 mL/hr at 03/14/20 2222  . diltiazem (CARDIZEM) infusion 10 mg/hr (03/15/20 1430)  . heparin 1,150 Units/hr (03/15/20 0856)  . magnesium sulfate bolus IVPB    . potassium chloride      PRN Meds: sodium chloride, albuterol, calcium carbonate, chlorpheniramine-HYDROcodone, cyclobenzaprine, hydrOXYzine, metoprolol tartrate, morphine injection, promethazine, sodium chloride flush  Physical Exam Pulmonary:     Effort: Pulmonary  effort is normal.  Neurological:     Mental Status: She is alert.             Vital Signs: BP (!) 147/74 (BP Location: Right Leg)   Pulse (!) 107   Temp 98.8 F (37.1 C) (Oral)   Resp 19   Ht 5\' 6"  (1.676 m)   Wt 80.5 kg   SpO2 97%   BMI 28.65 kg/m  SpO2: SpO2: 97 % O2 Device: O2 Device: Nasal Cannula O2 Flow Rate: O2 Flow Rate (L/min): 2 L/min  Intake/output summary:  Intake/Output Summary (Last 24 hours) at 03/15/2020 1440 Last data filed at 03/15/2020 0445 Gross per 24 hour  Intake --  Output 400 ml  Net -400 ml   LBM: Last BM Date: (unknown) Baseline Weight: Weight: 81.6 kg Most recent weight: Weight: 80.5 kg       Palliative Assessment/Data:      Patient Active Problem List   Diagnosis Date Noted  . Pleural effusion 03/09/2020  . MSSA bacteremia 02/17/2020  . Pyelonephritis 02/17/2020  . Acute hypoxemic respiratory failure ([place]) 02/17/2020  . Acute encephalopathy 02/17/2020  . Thrombocytopenia ([place]) 02/17/2020  . Sepsis ([place]) 02/16/2020  . Acute lower UTI 02/16/2020  . Toxic metabolic encephalopathy   . Pancreatic insufficiency 01/17/2020  . Pancreatic divisum 01/17/2020  . 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 MDD (major depressive disorder), recurrent episode, moderate ([place]) 08/18/2019  . GAD (generalized anxiety disorder) 08/18/2019  . Panic attacks 08/18/2019  . Cognitive disorder 08/18/2019  . Chest pain 07/09/2019  . Greater trochanteric bursitis of left hip 05/21/2019  . Anesthesia of skin 05/10/2019  . Loose stools 04/07/2019  . Anxiety 04/02/2019  . Appendicitis with perforation 03/09/2019  . Acute appendicitis   . Acute appendicitis with localized peritonitis 02/19/2019  . Chronic diastolic CHF (congestive heart failure) ([place]) 02/10/2019  . Atherosclerosis of aorta ([place]) 02/05/2019  . Bronchitis 12/28/2018  . AAA (abdominal aortic aneurysm) without rupture ([place]) 12/17/2018  . COPD exacerbation ([place]) 12/09/2018   . Iron deficiency anemia due to chronic blood loss 11/03/2018  . Obesity, Class II, BMI 35-39.9 09/23/2018  . Primary osteoarthritis of right knee 09/23/2018  . GERD (gastroesophageal reflux disease) 08/24/2018  . Hives 06/17/2018  . B12 deficiency 06/17/2018  . Anxiety and depression 03/23/2018  . Purpura ([place]) 08/06/2017  . Radicular pain in left arm 09/30/2016  . Cellulitis 09/26/2016  . Abscess 09/24/2016  . PAD (peripheral artery disease) ([place]) 08/13/2016  . Anemia 08/13/2016  . Urinary urgency 08/06/2016  . Thoracic back pain 05/20/2016  . Neuropathy 10/05/2015  . Tremors of nervous system 06/18/2015  . GI bleed due to NSAIDs 05/04/2015  . Dysuria 03/07/2015  . Atrial fibrillation with RVR ([place]) 03/11/2014  . COPD (chronic obstructive pulmonary disease) ([place]) 03/11/2014  . Flexor hallucis longus tendinitis 11/09/2013  . Vertigo 09/16/2013  . Chronic steroid use 09/11/2013  . Osteoarthritis 09/11/2013  . Shortness of breath 08/18/2013  . Generalized weakness 08/18/2013  . Barrett's esophagus 08/18/2013  . Chronic diarrhea 08/18/2013  . Numbness and tingling 08/18/2013  . Screening for breast cancer 08/18/2013  . Rheumatoid arthritis ([place]) 05/31/2013  . Seborrheic keratosis 12/14/2012  . Benign neoplasm of colon 11/23/2012  . Family history of malignant neoplasm of gastrointestinal tract 11/23/2012  . Prediabetes 12/16/2011    Palliative Care Assessment & Plan    Recommendations/Plan: Family meeting tomorrow at 1:00.     Code Status:    Code Status Orders  (From admission, onward)         Start     Ordered   03/14/20 1516  Limited resuscitation (code)  Continuous    Question Answer Comment  In the event of cardiac or respiratory ARREST: Initiate Code Blue, Call Rapid Response Yes   In the event of cardiac or respiratory ARREST: Perform CPR No   In the event of cardiac or respiratory ARREST: Perform Intubation/Mechanical Ventilation No   In the event  of cardiac or respiratory ARREST: Use NIPPV/BiPAp only if indicated Yes   In the event of  cardiac or respiratory ARREST: Administer ACLS medications if indicated No   In the event of cardiac or respiratory ARREST: Perform Defibrillation or Cardioversion if indicated No      03/14/20 1516        Code Status History    Date Active Date Inactive Code Status Order ID Comments User Context   03/09/2020 2037 03/14/2020 1516 Full Code [id number]  Mansy, Arvella Merles, MD ED   02/16/2020 1405 02/24/2020 0316 Full Code [id number]  Louellen Molder, MD ED   02/19/2019 0955 03/09/2019 0003 Full Code [id number]  Herbert Pun, MD ED   08/13/2016 2338 08/14/2016 1818 Full Code [id number]  Gladstone Lighter, MD Inpatient   05/04/2015 1355 05/08/2015 2116 Full Code [id number]  Loletha Grayer, MD ED   Advance Care Planning Activity    Advance Directive Documentation     Most Recent Value  Type of Advance Directive  Out of facility DNR (pink MOST or yellow form), Healthcare Power of Attorney  Pre-existing out of facility DNR order (yellow form or pink MOST form)  --  "MOST" Form in Place?  --      Prognosis:  Poor    Thank you for allowing the Palliative Medicine Team to assist in the care of this patient.   Time In: 1:20 Time Out: 2:46 Total Time 1 hours 26 min Prolonged Time Billed  yes      Greater than 50%  of this time was spent counseling and coordinating care related to the above assessment and plan.  Asencion Gowda, NP  Please contact Palliative Medicine Team phone at [phone] for questions and concerns.

## 2020-03-15 NOTE — Progress Notes (Signed)
Speech Language Pathology Treatment: Dysphagia  Patient Details Name: Brandy Armstrong MRN: [id number] DOB: March 29, 1946 Today's Date: 03/15/2020 Time: [id number] SLP Time Calculation (min) (ACUTE ONLY): 40 min  Assessment / Plan / Recommendation Clinical Impression  Pt seen today for ongoing toleration of diet; assessment of swallowing and trials to upgrade diet if appropriate. Upon entering room, pt did not verbally respond to [employer]. She watched SLP but no verbalizations were noted during the session. Pt was resistive to oral care attempted by SLP. She did not give any response to offers of "water" or "ice cream".  Pt was positioned upright and presented w/ trials of single ice chips. She exhibited oral confusion during bolus acceptance and prep c/b open mouth posture after boluses were placed anteriorly in mouth. Given tactile/verbal stim, pt addressed the ice chips by closing lips and demonstrating intermittent lingual movements for bolus manipulation. Similar was noted during 2 trials each of Nectar liquids, purees given later. Increased oral phase time noted. SLP continued to give cues and encouragement w/ po's. W/ Time, A-P transfer noted w/ pharyngeal swallow following. Pt was resistive to opening mouth to check for oral clearing b/t trials. Laryngeal excursion appeared adequate. As SLP continued to present po trials, pt Turned Head Away and closed lips Tightly - she shook her head "no" when asked if she wanted anymore. She appeared agitated as SLP attempted po trials this session.  Recommend continue w/ current dysphagia level 1 diet (puree) w/ Nectar liquids for safest bolus consistency control; aspiration precautions; feeding support and encouragement at meals. Recommend pills Crushed in Puree or alternative means if pt refuses. Recommend Palliative Care f/u for [facility]. Dietician f/u. ST services will be available for further trials and assessment when pt is receptive and agreeable to wanting  to take oral intake. MD and NSG updated.      HPI HPI: Pt is a 74 y.o. Caucasian female with a known history of multiple medical problems that are mentioned below including COPD, atrial fibrillation, on Eliquis, peripheral neuropathy and rheumatoid arthritis, who was recently admitted for MSSA bacteremia of unclear etiology for which she continues to take IV antibiotic therapy through PICC line at [facility] facility where she resides.  She presents to the emergency room with acute onset of worsening dyspnea with hypoxemia to 85% on room air with associated cough occasionally productive of yellowish sputum without fever or chills.  She denied any nausea or vomiting or abdominal pain.  No worsening lower extremity edema..  She is dyspneic in all positions. The patient underwent ultrasound-guided right thoracentesis in the ER with drainage of 1 L of pleural fluid.".       SLP Plan  Continue with current plan of care       Recommendations  Diet recommendations: Dysphagia 1 (puree);Nectar-thick liquid Liquids provided via: Teaspoon;Cup Medication Administration: Crushed with puree(for safer swallowing; alternative means if she refuses) Supervision: Staff to assist with self feeding;Full supervision/cueing for compensatory strategies Compensations: Minimize environmental distractions;Slow rate;Small sips/bites;Lingual sweep for clearance of pocketing;Multiple dry swallows after each bite/sip;Follow solids with liquid(Check for oral clearing) Postural Changes and/or Swallow Maneuvers: Seated upright 90 degrees;Upright 30-60 min after meal                General recommendations: (Dietician f/u; Palliative Care services for [place]) Oral Care Recommendations: Oral care BID;Oral care before and after PO;Staff/trained caregiver to provide oral care Follow up Recommendations: Skilled Nursing facility SLP Visit Diagnosis: Dysphagia, oropharyngeal phase (R13.12)(Cognitive decline  present) Plan: Continue with current plan of care       [name], [place], CCC-SLP Heide Brossart 03/15/2020, 4:29 PM

## 2020-03-15 NOTE — Progress Notes (Signed)
PICC line dressing changed per protocol. Pt tolerated procedure. I will continue to assess.

## 2020-03-15 NOTE — Consult Note (Signed)
[place] for a heparin infusion Indication: afib on eliquis PTA  Patient Measurements: Height: 5\' 6"  (167.6 cm) Weight: 177 lb 8 oz (80.5 kg) IBW/kg (Calculated) : 59.3 Heparin Dosing Weight: 76.4 kg  Vital Signs: Temp: 97.2 F (36.2 C) (03/17 0437) Temp Source: Oral (03/17 0437) BP: 111/58 (03/17 0437) Pulse Rate: 100 (03/17 0747)  Labs: Recent Labs    03/13/20 0019 03/13/20 0019 03/13/20 [id number] 03/13/20 0907 03/13/20 1113 03/13/20 1735 03/14/20 0455 03/14/20 0455 03/14/20 0457 03/14/20 1241 03/14/20 1951 03/15/20 0550  HGB  --   --  10.6*   < >  --   --   --   --  10.6*  --   --  10.9*  HCT  --   --  35.8*  --   --   --   --   --  34.0*  --   --  34.9*  PLT  --   --  268  --   --   --   --   --  283  --   --  298  APTT 50*   < > 85*  --   --    < > 47*   < >  --  100* 83* 122*  HEPARINUNFRC 1.27*  --   --   --   --   --  1.05*  --   --   --   --   --   CREATININE  --    < > 0.62   < > 0.60  --  0.70  --   --   --   --  0.56   < > = values in this interval not displayed.    Estimated Creatinine Clearance: 69 mL/min (by C-G formula based on SCr of 0.56 mg/dL).   Medical History: Past Medical History:  Diagnosis Date  . A-fib ([place])   . Barretts esophagus   . Chest pain    a. 02/2014 Myoview: Ef 50%, no ischemia.  . Colon polyps   . COPD (chronic obstructive pulmonary disease) ([place])   . Depression with anxiety   . GERD (gastroesophageal reflux disease)   . Hx of benign essential tremor   . Neuropathy   . Permanent atrial fibrillation [facility])    a. s/p ablation 01/2012 in [facility] by Dr. Boyd Kerbs;  b. On sotalol & Xarelto;  c. 02/2014 Echo: EF 50-55%, mild conc LVH, nl LA size/structure;  d. Recurrent afib 8/15 & 08/29/2014.  [name] Rectal fistula   . Rheumatoid arthritis ([place])    On methotrexate and orencia  . Urinary incontinence   . Vitamin D deficiency     Medications:  Scheduled:  . busPIRone  10 mg Oral TID  .  Chlorhexidine Gluconate Cloth  6 each Topical Daily  . feeding supplement (ENSURE ENLIVE)  237 mL Oral BID BM  . guaiFENesin  600 mg Oral BID  . ipratropium-albuterol  3 mL Nebulization TID  . lipase/protease/amylase  36,000 Units Oral TID WC  . magnesium oxide  200 mg Oral BID  . pantoprazole  40 mg Oral Daily  . sertraline  50 mg Oral Daily  . sodium chloride flush  10-40 mL Intracatheter Q12H  . traZODone  50 mg Oral QHS    Assessment: 74 y.o. femalewith a  history of  COPD, atrial fibrillation on Eliquis. Her last dose of Eliquis was 03/10/20 at 2334. She is being transitioned to a heparin drip in preparation  for a possible chest tube placement. She was a patient at [facility] approximately one year ago where she received IV heparin. Data from that admission was used to guide initial dosing   Goal of Therapy:  Heparin level 0.3-0.7 units/ml once aPTT and Heparin level correlate.  aPTT 66 - 102 seconds Monitor platelets by anticoagulation protocol: Yes   Plan:   3/17 @ 0550 aPTT 122 and HL 1.13, supratherapeutic  Decrease heparin drip to 1150 units/hr  Re-check aPTT and HL 8 hours after rate change  H&H, platelets stable. CBC daily while on heparin  Helper Resident 03/15/2020,7:57 AM

## 2020-03-15 NOTE — Progress Notes (Signed)
In regards to patient visitation, per our standard and transmission based precautions policy, visitation is not restricted for this patient. While the patient remains on airborne isolation: All visitors should be educated on proper hand hygiene and the use of surgical mask when entering the patient's room. Respiratory protection is optional for household members.  Non-household children under 72 years old should not visit rooms of patients for whom TB is diagnosed or suspected.

## 2020-03-15 NOTE — Progress Notes (Signed)
Per ID and MD the patient may have airbourne isolation discontinued. I will continue to assess.

## 2020-03-16 LAB — BASIC METABOLIC PANEL
Anion gap: 11 (ref 5–15)
BUN: 22 mg/dL (ref 8–23)
CO2: 29 mmol/L (ref 22–32)
Calcium: 8.2 mg/dL — ABNORMAL LOW (ref 8.9–10.3)
Chloride: 94 mmol/L — ABNORMAL LOW (ref 98–111)
Creatinine, Ser: 0.65 mg/dL (ref 0.44–1.00)
GFR calc Af Amer: >60 mL/min (ref >60)
GFR calc non Af Amer: >60 mL/min (ref >60)
Glucose, Bld: 94 mg/dL (ref 70–99)
Potassium: 4.1 mmol/L (ref 3.5–5.1)
Sodium: 134 mmol/L — ABNORMAL LOW (ref 135–145)

## 2020-03-16 LAB — CBC
HCT: 31.9 % — ABNORMAL LOW (ref 36.0–46.0)
Hemoglobin: 9.7 g/dL — ABNORMAL LOW (ref 12.0–15.0)
MCH: 24.9 pg — ABNORMAL LOW (ref 26.0–34.0)
MCHC: 30.4 g/dL (ref 30.0–36.0)
MCV: 81.8 fL (ref 80.0–100.0)
Platelets: 254 10*3/uL (ref 150–400)
RBC: 3.9 MIL/uL (ref 3.87–5.11)
RDW: 20.9 % — ABNORMAL HIGH (ref 11.5–15.5)
WBC: 8 10*3/uL (ref 4.0–10.5)
nRBC: 0 % (ref 0.0–0.2)

## 2020-03-16 LAB — APTT
aPTT: 100 seconds — ABNORMAL HIGH (ref 24–36)
aPTT: 88 seconds — ABNORMAL HIGH (ref 24–36)

## 2020-03-16 LAB — GLUCOSE, CAPILLARY
Glucose-Capillary: 101 mg/dL — ABNORMAL HIGH (ref 70–99)
Glucose-Capillary: 93 mg/dL (ref 70–99)
Glucose-Capillary: 98 mg/dL (ref 70–99)
Glucose-Capillary: 95 mg/dL (ref 70–99)
Glucose-Capillary: 97 mg/dL (ref 70–99)

## 2020-03-16 LAB — PHOSPHORUS: Phosphorus: 2.3 mg/dL — ABNORMAL LOW (ref 2.5–4.6)

## 2020-03-16 LAB — MAGNESIUM: Magnesium: 2.2 mg/dL (ref 1.7–2.4)

## 2020-03-16 LAB — HEPARIN LEVEL (UNFRACTIONATED): Heparin Unfractionated: 1 IU/mL — ABNORMAL HIGH (ref 0.30–0.70)

## 2020-03-16 NOTE — Progress Notes (Signed)
Progress Note    Brandy Armstrong  [id number] DOB: 1946/11/01  DOA: 03/09/2020 PCP: [name], FNP      Brief Narrative:    Medical records reviewed and are as summarized below:  Brandy Armstrong is an 74 y.o. female with a known history of multiple medical problems that are mentioned below including COPD, atrial fibrillation, on Eliquis, peripheral neuropathy and rheumatoid arthritis, who was recently admitted for MSSA bacteremia of unclear etiology for which she continues to take IV antibiotic therapy through PICC line at [facility] facility where she resides.  She presents to the emergency room with acute onset of worsening dyspnea with hypoxemia to 85% on room air with associated cough occasionally productive of yellowish sputum without fever or chills. The patient underwent ultrasound-guided right thoracentesis in the ER with drainage of 1 L of pleural fluid.      Assessment/Plan:   Active Problems:   Pleural effusion  Exudative Right Pleural Effusion  Acute Hypoxic Respiratory Failure:  Currently requiring 2 L [place] S/p thoracentesis in ED with 1 L serosanguinous fluid removed on 3/11 3/12 with thoracentesis by IR with 1.2 L bloody pleural fluid -> c/b R basilar ex vacuo hydropneumothorax Repeat CXR 3/12 with interval reduction of R sided pleural effusion -> tiny R basilar ex vacuo hydropneumothorax.  Improved aeration of the R mid and lower lung with persistent R medial basilar heterogeneous/consolidative opacities (atelectasis vs infiltrate) CXR 3/13 with mild interval worsening in lung aeration on the R consistent with increase in R pleural fluid and atelectasis CXR 3/14 with moderate layering R pleural effusion with R lower lobe atelectasis CXR 3/15 with small R effusion and R basilar atelectasis CT chest on 3/15 notable for small R sided effusion with mild loculation at posterior R upper lobe (overall decreased in size) - residual  consolidation, atelectasis vs pneumonia. Exudative by lights No malignancy on pleural fluid cytology No growth from pleural fluid culture quant gold (negative), AFB smear from pleural fluid is negative (AFB cx is pending). Discussed with Dr. Patsey Berthold. Ok to discontinue airborne isolation. Hold plaquenil and adalimumab Continue IV Cefepime Blood cx NGTD    Delirium: persistently confused.  Likely 2/2 hypoxia and hospitalization.  Per family, she's been declining from mental status standpoint (and in general) over the past years and has been confused when hospitalized before. Follow TSH (wnl), B12 (wnl), folate (wnl), VBG (without hypercarbia) and head CT (without acute abnormality) She has poor PO intake and is not taking meds   Recent MSSA bacteremia. -We will continue IV Ancef that she supposed to take till 03/26/2020. (switch to cefepime for now to cover for pneumonia -> consider 7 day course of cefepime, then transition bact to ancef?) -The patient has finished a course of IV Invanz on 03/08/2020 (will need to review this, I don't see this clearly? Family member did acknowledge she was treated with abx at [facility])   COPD. -She does not seem to have any current acute flare. -She will be placed on duo nebs as mentioned above.   Rheumatoid arthritis. -We willholdPlaquenilgiven prolonged QT interval. - Repeat EKG -> improved qtc   Hypertension. -We will continue HCTZ and Cardizem CD.   Atrial fibrillation with mildly rapid ventricular response, likely secondary to #1. - Hold eliquis in preparation for procedure.  Heparin and dilt gtt (pt not taking PO well).   Anxiety. -We will continue BuSpar.   DVT prophylaxis. -eliquis on hold.  She is on  IV heparin  # Hypokalemia: replace and follow  Patient is being followed by the hospice team.  Hospice team will meet with family tomorrow to discuss options and finalize plans.    Body mass index is 29.5  kg/m.   Family Communication/Anticipated D/C date and plan/Code Status   DVT prophylaxis: SCD Code Status: Use BiPAP but no CPR. DNI Family Communication: None Disposition Plan: Patient is from SNF. Plamn to discharge to SNF when cleared by pulmonologist for discharge.      Subjective:   Patient is unable to provide any history  Objective:    Vitals:   03/15/20 1953 03/16/20 0446 03/16/20 0715 03/16/20 0800  BP:  109/65 119/61   Pulse:  (!) 103 91 100  Resp:  20 16 18   Temp:  97.6 F (36.4 C) 98.5 F (36.9 C)   TempSrc:  Oral Oral   SpO2: 94%  95% 94%  Weight:  82.9 kg    Height:        Intake/Output Summary (Last 24 hours) at 03/16/2020 1012 Last data filed at 03/16/2020 0715 Gross per 24 hour  Intake 2636.2 ml  Output 500 ml  Net 2136.2 ml   Filed Weights   03/14/20 0340 03/15/20 0437 03/16/20 0446  Weight: 79.7 kg 80.5 kg 82.9 kg    Exam:  GEN: NAD SKIN: Warm and dry EYES: EOMI ENT: MMM CV: RRR PULM: CTA B ABD: soft, ND, NT, +BS CNS: Alert but confused.  She is more talkative today.  She doesn't follow commands. EXT: No edema or tenderness   Data Reviewed:   I have personally reviewed following labs and imaging studies:  Labs: Labs show the following:   Basic Metabolic Panel: Recent Labs  Lab 03/12/20 0714 03/12/20 0714 03/12/20 2141 03/12/20 2141 03/13/20 0907 03/13/20 0907 03/13/20 1113 03/13/20 1113 03/14/20 0455 03/14/20 0455 03/15/20 0550 03/16/20 0215  NA 135   < > 135   < > 129*  --  133*  --  136  --  135 134*  K 3.1*   < > 3.0*   < > 3.0*   < > 2.9*   < > 3.4*   < > 3.3* 4.1  CL 87*   < > 84*   < > 79*  --  80*  --  82*  --  90* 94*  CO2 36*   < > 36*   < > 36*  --  41*  --  38*  --  34* 29  GLUCOSE 82   < > 70   < > 411*  --  162*  --  106*  --  105* 94  BUN 14   < > 15   < > 16  --  17  --  22  --  23 22  CREATININE 0.50   < > 0.54   < > 0.62  --  0.60  --  0.70  --  0.56 0.65  CALCIUM 8.5*   < > 8.6*   < > 8.5*  --   8.6*  --  8.8*  --  8.6* 8.2*  MG 1.7   < > 1.5*  --  1.8  --   --   --  1.8  --  1.7 2.2  PHOS 2.5  --   --   --  2.2*  --   --   --  2.9  --  2.6 2.3*   < > = values in this interval not displayed.  GFR Estimated Creatinine Clearance: 70 mL/min (by C-G formula based on SCr of 0.65 mg/dL). Liver Function Tests: Recent Labs  Lab 03/11/20 0421 03/12/20 0714 03/13/20 0907 03/14/20 0455 03/15/20 0550  AST 16 19 19 19 17   ALT <5 5 6 6 5   ALKPHOS 80 92 96 94 88  BILITOT 0.6 1.0 1.5* 1.5* 1.4*  PROT 6.3* 6.5 7.2 6.8 6.6  ALBUMIN 2.3* 2.4* 2.6* 2.4* 2.4*   No results for input(s): LIPASE, AMYLASE in the last 168 hours. No results for input(s): AMMONIA in the last 168 hours. Coagulation profile No results for input(s): INR, PROTIME in the last 168 hours.  CBC: Recent Labs  Lab 03/11/20 0421 03/11/20 0421 03/12/20 0714 03/13/20 0907 03/14/20 0457 03/15/20 0550 03/16/20 0215  WBC 6.9   < > 5.5 6.8 7.9 7.2 8.0  NEUTROABS 5.0  --  4.1 5.0 5.8 5.3  --   HGB 9.5*   < > 10.0* 10.6* 10.6* 10.9* 9.7*  HCT 33.7*   < > 34.7* 35.8* 34.0* 34.9* 31.9*  MCV 85.1   < > 84.4 82.3 80.0 80.2 81.8  PLT 277   < > 251 268 283 298 254   < > = values in this interval not displayed.   Cardiac Enzymes: No results for input(s): CKTOTAL, CKMB, CKMBINDEX, TROPONINI in the last 168 hours. BNP (last 3 results) No results for input(s): PROBNP in the last 8760 hours. CBG: Recent Labs  Lab 03/15/20 1152 03/15/20 1623 03/15/20 2052 03/16/20 0036 03/16/20 0721  GLUCAP 106* 93 90 101* 93   D-Dimer: No results for input(s): DDIMER in the last 72 hours. Hgb A1c: No results for input(s): HGBA1C in the last 72 hours. Lipid Profile: No results for input(s): CHOL, HDL, LDLCALC, TRIG, CHOLHDL, LDLDIRECT in the last 72 hours. Thyroid function studies: No results for input(s): TSH, T4TOTAL, T3FREE, THYROIDAB in the last 72 hours.  Invalid input(s): FREET3 Anemia work up: No results for input(s):  VITAMINB12, FOLATE, FERRITIN, TIBC, IRON, RETICCTPCT in the last 72 hours. Sepsis Labs: Recent Labs  Lab 03/13/20 0907 03/14/20 0457 03/15/20 0550 03/16/20 0215  WBC 6.8 7.9 7.2 8.0    Microbiology Recent Results (from the past 240 hour(s))  Body fluid culture (includes gram stain)     Status: None   Collection Time: 03/09/20  6:52 PM   Specimen: Pleural Fluid  Result Value Ref Range Status   Specimen Description   Final    PLEURAL Performed at [facility], [address] [place], [place] [address]    Special Requests   Final    PLEURAL Performed at [facility], [address] [place], [place] [address]    Gram Stain   Final    FEW WBC PRESENT, PREDOMINANTLY MONONUCLEAR NO ORGANISMS SEEN Performed at [facility], [place] [address], [place], [place] [address]    Culture NO GROWTH  Final   Report Status 03/13/2020 FINAL  Final  Acid Fast Smear (AFB)     Status: None   Collection Time: 03/09/20  6:52 PM  Result Value Ref Range Status   AFB Specimen Processing Concentration  Final   Acid Fast Smear Negative  Final    Comment: (NOTE) Performed At: [facility] [place], [place] [id number] Rush Farmer MD [id number]    Source (AFB) PLEURAL  Final  Respiratory Panel by RT PCR (Flu A&B, Covid) - Nasopharyngeal Swab     Status: None   Collection Time: 03/09/20  8:37  PM   Specimen: Nasopharyngeal Swab  Result Value Ref Range Status   SARS Coronavirus 2 by RT PCR NEGATIVE NEGATIVE Final    Comment: (NOTE) SARS-CoV-2 target nucleic acids are NOT DETECTED. The SARS-CoV-2 RNA is generally detectable in upper respiratoy specimens during the acute phase of infection. The lowest concentration of SARS-CoV-2 viral copies this assay can detect is 131 copies/mL. A negative result does not preclude SARS-Cov-2 infection and should not be used as the sole basis for treatment or other patient management decisions. A negative result  may occur with  improper specimen collection/handling, submission of specimen other than nasopharyngeal swab, presence of viral mutation(s) within the areas targeted by this assay, and inadequate number of viral copies (<131 copies/mL). A negative result must be combined with clinical observations, patient history, and epidemiological information. The expected result is Negative. Fact Sheet for Patients:  PinkCheek.be Fact Sheet for Healthcare Providers:  GravelBags.it This test is not yet ap proved or cleared by the [place] FDA and  has been authorized for detection and/or diagnosis of SARS-CoV-2 by FDA under an Emergency Use Authorization (EUA). This EUA will remain  in effect (meaning this test can be used) for the duration of the COVID-19 declaration under Section 564(b)(1) of the Act, 21 U.S.C. section 360bbb-3(b)(1), unless the authorization is terminated or revoked sooner.    Influenza A by PCR NEGATIVE NEGATIVE Final   Influenza B by PCR NEGATIVE NEGATIVE Final    Comment: (NOTE) The Xpert Xpress SARS-CoV-2/FLU/RSV assay is intended as an aid in  the diagnosis of influenza from Nasopharyngeal swab specimens and  should not be used as a sole basis for treatment. Nasal washings and  aspirates are unacceptable for Xpert Xpress SARS-CoV-2/FLU/RSV  testing. Fact Sheet for Patients: PinkCheek.be Fact Sheet for Healthcare Providers: GravelBags.it This test is not yet approved or cleared by the [place] FDA and  has been authorized for detection and/or diagnosis of SARS-CoV-2 by  FDA under an Emergency Use Authorization (EUA). This EUA will remain  in effect (meaning this test can be used) for the duration of the  Covid-19 declaration under Section 564(b)(1) of the Act, 21  U.S.C. section 360bbb-3(b)(1), unless the authorization is  terminated or  revoked. Performed at [facility], [address] [place], [place] [address]   Culture, blood (routine x 2)     Status: None   Collection Time: 03/09/20  9:31 PM   Specimen: BLOOD  Result Value Ref Range Status   Specimen Description BLOOD LEFT ASSIST CONTROL  Final   Special Requests   Final    BOTTLES DRAWN AEROBIC AND ANAEROBIC Blood Culture adequate volume   Culture   Final    NO GROWTH 5 DAYS Performed at [facility], [address] [place], [place] [address]    Report Status 03/14/2020 FINAL  Final  Culture, blood (routine x 2)     Status: None   Collection Time: 03/09/20  9:45 PM   Specimen: BLOOD  Result Value Ref Range Status   Specimen Description BLOOD RIGHT ASSIST CONTROL  Final   Special Requests   Final    BOTTLES DRAWN AEROBIC AND ANAEROBIC Blood Culture adequate volume   Culture   Final    NO GROWTH 5 DAYS Performed at [facility], [address] [place],  [address]    Report Status 03/14/2020 FINAL  Final  MRSA PCR Screening     Status: None   Collection Time: 03/10/20  3:58 PM  Specimen: Nasopharyngeal  Result Value Ref Range Status   MRSA by PCR NEGATIVE NEGATIVE Final    Comment:        The GeneXpert MRSA Assay (FDA approved for NASAL specimens only), is one component of a comprehensive MRSA colonization surveillance program. It is not intended to diagnose MRSA infection nor to guide or monitor treatment for MRSA infections. Performed at [facility], [address], [place], [place] [address]     Procedures and diagnostic studies:  DG Chest [facility] 1 View  Result Date: 03/15/2020 CLINICAL DATA:  Shortness of breath. EXAM: PORTABLE CHEST 1 VIEW COMPARISON:  03/13/2020 FINDINGS: 0702 hours. Low lung volumes with persistent right base collapse/consolidation. Known loculated right pleural fluid collection as noted on CT scan 2 days ago. The cardio pericardial silhouette is enlarged and accentuated  by rightward patient rotation on the current study. Right PICC line remains in place. The visualized bony structures of the thorax are intact. Telemetry leads overlie the chest. IMPRESSION: Stable.  Right base collapse/consolidation with cardiomegaly. Electronically Signed   By: Misty Stanley M.D.   On: 03/15/2020 09:50    Medications:   . busPIRone  10 mg Oral TID  . Chlorhexidine Gluconate Cloth  6 each Topical Daily  . feeding supplement (NEPRO CARB STEADY)  237 mL Oral BID BM  . guaiFENesin  600 mg Oral BID  . ipratropium-albuterol  3 mL Nebulization TID  . lipase/protease/amylase  36,000 Units Oral TID WC  . magnesium oxide  200 mg Oral BID  . pantoprazole  40 mg Oral Daily  . sertraline  50 mg Oral Daily  . sodium chloride flush  10-40 mL Intracatheter Q12H  . traZODone  50 mg Oral QHS   Continuous Infusions: . sodium chloride Stopped (03/15/20 0856)  . ceFEPime (MAXIPIME) IV 2 g (03/16/20 0204)  . D-10-0.45% Sodium Chloride with KCL 40 meq/L 1000 ml 30 mL/hr at 03/16/20 0842  . diltiazem (CARDIZEM) infusion 10 mg/hr (03/16/20 0428)  . heparin 1,050 Units/hr (03/15/20 1831)     LOS: 7 days   Xai Frerking  [facility]     03/16/2020, 10:12 AM

## 2020-03-16 NOTE — Consult Note (Signed)
[place] for a heparin infusion Indication: afib on eliquis PTA  Patient Measurements: Height: 5\' 6"  (167.6 cm) Weight: 177 lb 8 oz (80.5 kg) IBW/kg (Calculated) : 59.3 Heparin Dosing Weight: 76 kg  Vital Signs: Temp: 98 F (36.7 C) (03/17 1922) Temp Source: Oral (03/17 1922) BP: 120/67 (03/17 1922) Pulse Rate: 99 (03/17 1922)  Labs: Recent Labs    03/13/20 0907 03/14/20 0455 03/14/20 0457 03/14/20 1241 03/15/20 0550 03/15/20 1607 03/16/20 0215  HGB   < >  --  10.6*  --  10.9*  --  9.7*  HCT   < >  --  34.0*  --  34.9*  --  31.9*  PLT   < >  --  283  --  298  --  254  APTT  --  47*  --    < > 122* 107* 100*  HEPARINUNFRC   < > 1.05*  --   --  1.13* 1.10* 1.00*  CREATININE  --  0.70  --   --  0.56  --  0.65   < > = values in this interval not displayed.    Estimated Creatinine Clearance: 69 mL/min (by C-G formula based on SCr of 0.65 mg/dL).  Assessment: 74 y.o. femalewith a  history of  COPD, atrial fibrillation on Eliquis. Her last dose of Eliquis was 03/10/20 at 2334. She is being transitioned to a heparin drip in preparation for a possible chest tube placement. She was a patient at [facility] approximately one year ago where she received IV heparin. Data from that admission was used to guide initial dosing   Goal of Therapy:  Heparin level 0.3-0.7 units/ml once aPTT and Heparin level correlate.  aPTT 66 - 102 seconds Monitor platelets by anticoagulation protocol: Yes   Plan:   3/18 @ 0215 aPTT 100 and HL 1.0, aPTT is therapeutic x 1, trending down  no s/sx of bleeding noted  Continue heparin drip at 1050 units/hr  Re-check aPTT in ~8 hours to confirm   H&H worse, platelets stable. CBC daily while on heparin  Pharmacy will continue to follow.  Hart Robinsons, PharmD Clinical Pharmacist 03/16/2020 3:02 AM

## 2020-03-16 NOTE — Progress Notes (Signed)
Daily Progress Note   Patient Name: Brandy Armstrong       Date: 03/16/2020 DOB: 1946-02-16  Age: 74 y.o. MRN#: [id number] Attending Physician: Jennye Boroughs, MD Primary Care Physician: [name], FNP Admit Date: 03/09/2020  Reason for Consultation/Follow-up: Establishing goals of care  Subjective: Patient is resting in bed. [name] and Ginger are at bedside, and feel that she has improved. Patient is able to identify them, and the pastor who is on speakerphone.  When asked if she lived alone and if she had pets, she pondered, but was unable to answer the questions. Patient is not eating or drinking. She requires assistance such that she will require SNF placement. [place] is a large part of her QOL. HPOA's asked her multiple times her thoughts on some of the conversation and decisions as we talked. She stated she did not know, did not understand, or could not remember. Plans to remeet tomorrow at 12:00 to discuss plan moving forward.   Length of Stay: 7  Current Medications: Scheduled Meds:  . busPIRone  10 mg Oral TID  . Chlorhexidine Gluconate Cloth  6 each Topical Daily  . feeding supplement (NEPRO CARB STEADY)  237 mL Oral BID BM  . guaiFENesin  600 mg Oral BID  . ipratropium-albuterol  3 mL Nebulization TID  . lipase/protease/amylase  36,000 Units Oral TID WC  . magnesium oxide  200 mg Oral BID  . pantoprazole  40 mg Oral Daily  . sertraline  50 mg Oral Daily  . sodium chloride flush  10-40 mL Intracatheter Q12H  . traZODone  50 mg Oral QHS    Continuous Infusions: . sodium chloride Stopped (03/15/20 0856)  . ceFEPime (MAXIPIME) IV 2 g (03/16/20 0204)  . D-10-0.45% Sodium Chloride with KCL 40 meq/L 1000 ml 30 mL/hr at 03/16/20 0842  . diltiazem (CARDIZEM) infusion 10  mg/hr (03/16/20 0428)  . heparin 1,050 Units/hr (03/15/20 1831)    PRN Meds: sodium chloride, albuterol, calcium carbonate, chlorpheniramine-HYDROcodone, cyclobenzaprine, hydrOXYzine, metoprolol tartrate, morphine injection, promethazine, sodium chloride flush  Physical Exam Pulmonary:     Effort: Pulmonary effort is normal.  Neurological:     Mental Status: She is alert.             Vital Signs: BP 109/62 (BP Location: Left Arm)   Pulse 94  Temp 98.3 F (36.8 C) (Oral)   Resp 20   Ht 5\' 6"  (1.676 m)   Wt 82.9 kg   SpO2 94%   BMI 29.50 kg/m  SpO2: SpO2: 94 % O2 Device: O2 Device: Nasal Cannula O2 Flow Rate: O2 Flow Rate (L/min): 2 L/min  Intake/output summary:   Intake/Output Summary (Last 24 hours) at 03/16/2020 1418 Last data filed at 03/16/2020 0715 Gross per 24 hour  Intake 2636.2 ml  Output 500 ml  Net 2136.2 ml   LBM: Last BM Date: (unknown) Baseline Weight: Weight: 81.6 kg Most recent weight: Weight: 82.9 kg       Palliative Assessment/Data:      Patient Active Problem List   Diagnosis Date Noted  . Pleural effusion 03/09/2020  . MSSA bacteremia 02/17/2020  . Pyelonephritis 02/17/2020  . Acute hypoxemic respiratory failure ([place]) 02/17/2020  . Acute encephalopathy 02/17/2020  . Thrombocytopenia ([place]) 02/17/2020  . Sepsis ([place]) 02/16/2020  . Acute lower UTI 02/16/2020  . Toxic metabolic encephalopathy   . Pancreatic insufficiency 01/17/2020  . Pancreatic divisum 01/17/2020  . 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 MDD (major depressive disorder), recurrent episode, moderate ([place]) 08/18/2019  . GAD (generalized anxiety disorder) 08/18/2019  . Panic attacks 08/18/2019  . Cognitive disorder 08/18/2019  . Chest pain 07/09/2019  . Greater trochanteric bursitis of left hip 05/21/2019  . Anesthesia of skin 05/10/2019  . Loose stools 04/07/2019  . Anxiety 04/02/2019  . Appendicitis with perforation 03/09/2019  . Acute  appendicitis   . Acute appendicitis with localized peritonitis 02/19/2019  . Chronic diastolic CHF (congestive heart failure) ([place]) 02/10/2019  . Atherosclerosis of aorta ([place]) 02/05/2019  . Bronchitis 12/28/2018  . AAA (abdominal aortic aneurysm) without rupture ([place]) 12/17/2018  . COPD exacerbation ([place]) 12/09/2018  . Iron deficiency anemia due to chronic blood loss 11/03/2018  . Obesity, Class II, BMI 35-39.9 09/23/2018  . Primary osteoarthritis of right knee 09/23/2018  . GERD (gastroesophageal reflux disease) 08/24/2018  . Hives 06/17/2018  . B12 deficiency 06/17/2018  . Anxiety and depression 03/23/2018  . Purpura ([place]) 08/06/2017  . Radicular pain in left arm 09/30/2016  . Cellulitis 09/26/2016  . Abscess 09/24/2016  . PAD (peripheral artery disease) ([place]) 08/13/2016  . Anemia 08/13/2016  . Urinary urgency 08/06/2016  . Thoracic back pain 05/20/2016  . Neuropathy 10/05/2015  . Tremors of nervous system 06/18/2015  . GI bleed due to NSAIDs 05/04/2015  . Dysuria 03/07/2015  . Atrial fibrillation with RVR ([place]) 03/11/2014  . COPD (chronic obstructive pulmonary disease) ([place]) 03/11/2014  . Flexor hallucis longus tendinitis 11/09/2013  . Vertigo 09/16/2013  . Chronic steroid use 09/11/2013  . Osteoarthritis 09/11/2013  . Shortness of breath 08/18/2013  . Generalized weakness 08/18/2013  . Barrett's esophagus 08/18/2013  . Chronic diarrhea 08/18/2013  . Numbness and tingling 08/18/2013  . Screening for breast cancer 08/18/2013  . Rheumatoid arthritis ([place]) 05/31/2013  . Seborrheic keratosis 12/14/2012  . Benign neoplasm of colon 11/23/2012  . Family history of malignant neoplasm of gastrointestinal tract 11/23/2012  . Prediabetes 12/16/2011    Palliative Care Assessment & Plan    Recommendations/Plan: Family meeting tomorrow at 12:00.     Code Status:    Code Status Orders  (From admission, onward)         Start     Ordered   03/14/20 1516  Limited  resuscitation (code)  Continuous    Question Answer Comment  In the event of cardiac or respiratory  ARREST: Initiate Code Blue, Call Rapid Response Yes   In the event of cardiac or respiratory ARREST: Perform CPR No   In the event of cardiac or respiratory ARREST: Perform Intubation/Mechanical Ventilation No   In the event of cardiac or respiratory ARREST: Use NIPPV/BiPAp only if indicated Yes   In the event of cardiac or respiratory ARREST: Administer ACLS medications if indicated No   In the event of cardiac or respiratory ARREST: Perform Defibrillation or Cardioversion if indicated No      03/14/20 1516        Code Status History    Date Active Date Inactive Code Status Order ID Comments User Context   03/09/2020 2037 03/14/2020 1516 Full Code [id number]  Mansy, Arvella Merles, MD ED   02/16/2020 1405 02/24/2020 0316 Full Code [id number]  Louellen Molder, MD ED   02/19/2019 0955 03/09/2019 0003 Full Code [id number]  Herbert Pun, MD ED   08/13/2016 2338 08/14/2016 1818 Full Code [id number]  Gladstone Lighter, MD Inpatient   05/04/2015 1355 05/08/2015 2116 Full Code [id number]  Loletha Grayer, MD ED   Advance Care Planning Activity    Advance Directive Documentation     Most Recent Value  Type of Advance Directive  Out of facility DNR (pink MOST or yellow form), Healthcare Power of Attorney  Pre-existing out of facility DNR order (yellow form or pink MOST form)  --  "MOST" Form in Place?  --      Prognosis:  Poor    Thank you for allowing the Palliative Medicine Team to assist in the care of this patient.   Time In: 1:00 Time Out: 2:10 Total Time 70 min Prolonged Time Billed  yes      Greater than 50%  of this time was spent counseling and coordinating care related to the above assessment and plan.  Asencion Gowda, NP  Please contact Palliative Medicine Team phone at [phone] for questions and concerns.

## 2020-03-16 NOTE — Care Management Important Message (Signed)
Important Message  Patient Details  Name: Brandy Armstrong MRN: [id number] Date of Birth: [birth year]   Medicare Important Message Given:  Yes     Dannette Barbara 03/16/2020, 1:16 PM

## 2020-03-16 NOTE — Progress Notes (Signed)
Physical Therapy Treatment Patient Details Name: Brandy Armstrong MRN: [id number] DOB: [birth year] Today's Date: 03/16/2020    History of Present Illness 74 y.o. female, with history of A. fib on anticoagulation, chronic pancreatic insufficiency, chronic back pain COPD, rheumatoid arthritis here with pleural effusion with possible chest tube placement. Recent admission.    PT Comments    Participated in exercises as described below.  To EOB with mod a x 1 and good effort.  Once sitting demonstrates overall fear of falling and encouragement given.  She is able to sit unsupported with good balance.  Fearful of standing attempts but with encouragement she is able to stand x 2 with walker with mod a x 2 to stand and min a x 2 once up and able to remain standing for pericare due to BM.  Returned to supine with min a x 2.  Overall improved mobility Fear will be a barrier to progression.   Follow Up Recommendations  SNF     Equipment Recommendations  None recommended by PT    Recommendations for Other Services       Precautions / Restrictions Precautions Precautions: Fall;Other (comment) Restrictions Weight Bearing Restrictions: No    Mobility  Bed Mobility Overal bed mobility: Needs Assistance Bed Mobility: Supine to Sit;Sit to Supine     Supine to sit: Mod assist Sit to supine: +2 for physical assistance;Mod assist   General bed mobility comments: improved today with less assist.  Transfers Overall transfer level: Needs assistance Equipment used: Rolling walker (2 wheeled) Transfers: Sit to/from Stand Sit to Stand: Mod assist;Min assist;+2 physical assistance         General transfer comment: Increased assist to stand but once up able to stand with min a x 1.  Ambulation/Gait             General Gait Details: unable to perform at this time   Stairs             Wheelchair Mobility    Modified Rankin (Stroke Patients Only)       Balance Overall balance  assessment: Needs assistance Sitting-balance support: Bilateral upper extremity supported;Feet supported Sitting balance-Leahy Scale: Good     Standing balance support: Bilateral upper extremity supported Standing balance-Leahy Scale: Fair                              Cognition Arousal/Alertness: Awake/alert Behavior During Therapy: Flat affect Overall Cognitive Status: Impaired/Different from baseline                                 General Comments: answers appropriately today      Exercises Total Joint Exercises Ankle Circles/Pumps: AROM;Strengthening;Both;10 reps Quad Sets: Strengthening;Both;10 reps Gluteal Sets: Strengthening;Both;10 reps Towel Squeeze: AROM;Strengthening;Both;10 reps Long Arc Quad: AROM;Strengthening;Both;10 reps Marching in Standing: AROM;Strengthening;Both;5 reps    General Comments        Pertinent Vitals/Pain Pain Assessment: No/denies pain Faces Pain Scale: No hurt    Home Living                      Prior Function            PT Goals (current goals can now be found in the care plan section) Progress towards PT goals: Progressing toward goals    Frequency    Min 2X/week      PT  Plan Current plan remains appropriate    Co-evaluation              AM-PAC PT "6 Clicks" Mobility   Outcome Measure  Help needed turning from your back to your side while in a flat bed without using bedrails?: A Lot Help needed moving from lying on your back to sitting on the side of a flat bed without using bedrails?: A Lot Help needed moving to and from a bed to a chair (including a wheelchair)?: A Lot Help needed standing up from a chair using your arms (e.g., wheelchair or bedside chair)?: A Lot Help needed to walk in hospital room?: Total Help needed climbing 3-5 steps with a railing? : Total 6 Click Score: 10    End of Session Equipment Utilized During Treatment: Oxygen;Gait belt Activity Tolerance:  Patient tolerated treatment well Patient left: in bed;with bed alarm set;with call bell/phone within reach;with family/visitor present Nurse Communication: Mobility status       Time: 1415-1441 PT Time Calculation (min) (ACUTE ONLY): 26 min  Charges:  $Therapeutic Exercise: 8-22 mins $Therapeutic Activity: 8-22 mins                     Chesley Noon, PTA 03/16/20, 2:49 PM

## 2020-03-16 NOTE — Progress Notes (Signed)
Speech Language Pathology Treatment: Dysphagia  Patient Details Name: Brandy Armstrong MRN: [id number] DOB: Aug 13, 1946 Today's Date: 03/16/2020 Time: [id number] SLP Time Calculation (min) (ACUTE ONLY): 40 min  Assessment / Plan / Recommendation Clinical Impression  Pt seen today for ongoing toleration of diet; assessment of swallowing and trials to upgrade diet if appropriate. Upon entering room, pt verbally respond to [employer] w/ few spontaneous words, greeting. NSG had reported limited to no verbalizations noted during at breakfast meal this morning; also refused po's offered then but took 2 tsps of puree/meds. Pt did participate in holding Cup this session when presented for drinking.  Pt was positioned upright and presented w/ trials of thin liquids, purees. She held Cup w/ shaky UEs then fed self; some encouragement. She  exhibited min oral prep confusion during bolus acceptance but once taken orally, lingual movements for bolus manipulation were adequate. Fairly timely oral phase time noted for bolus management and oral clearing. Timely pharyngeal swallowing appreciated. SLP continued to give cues and encouragement w/ po's. Laryngeal excursion appeared adequate. As SLP continued to encourage po trials, pt Turned Head Away and closing mouth - she shook her head and said "no, not right now" when asked if she wanted anymore. She appeared adamant that she did not want any more po's. Suspect pt will be challenged in meeting her nutrition/hydration needs.   Recommend continue w/ current dysphagia level 1 diet (puree) but w/ thin liquids; aspiration precautions; feeding support and encouragement at meals. Recommend pills Crushed in Puree or alternative means if pt refuses. Recommend Palliative Care f/u for GOC. Dietician f/u. ST services will be available for further trials and assessment over next few days. MD and NSG updated.    HPI HPI: Pt is a 74 y.o. Caucasian female with a known history of  multiple medical problems that are mentioned below including COPD, atrial fibrillation, on Eliquis, peripheral neuropathy and rheumatoid arthritis, who was recently admitted for MSSA bacteremia of unclear etiology for which she continues to take IV antibiotic therapy through PICC line at [facility] facility where she resides.  She presents to the emergency room with acute onset of worsening dyspnea with hypoxemia to 85% on room air with associated cough occasionally productive of yellowish sputum without fever or chills.  She denied any nausea or vomiting or abdominal pain.  No worsening lower extremity edema..  She is dyspneic in all positions. The patient underwent ultrasound-guided right thoracentesis in the ER with drainage of 1 L of pleural fluid.".       SLP Plan  Continue with current plan of care       Recommendations  Diet recommendations: Dysphagia 1 (puree);Thin liquid Liquids provided via: Cup;Straw Medication Administration: Crushed with puree(for safer swallowing) Supervision: Staff to assist with self feeding;Full supervision/cueing for compensatory strategies Compensations: Minimize environmental distractions;Slow rate;Small sips/bites;Lingual sweep for clearance of pocketing;Multiple dry swallows after each bite/sip;Follow solids with liquid Postural Changes and/or Swallow Maneuvers: Seated upright 90 degrees;Upright 30-60 min after meal                General recommendations: (Dietician f/u) Oral Care Recommendations: Oral care BID;Oral care before and after PO;Staff/trained caregiver to provide oral care Follow up Recommendations: Skilled Nursing facility SLP Visit Diagnosis: Dysphagia, oropharyngeal phase (R13.12)(declined Cognitive status) Plan: Continue with current plan of care       [place], [place], CCC-SLP [employer]  03/16/2020, 1:20 PM

## 2020-03-16 NOTE — Consult Note (Signed)
[place] for a heparin infusion Indication: afib on eliquis PTA  Patient Measurements: Height: 5\' 6"  (167.6 cm) Weight: 182 lb 12.2 oz (82.9 kg) IBW/kg (Calculated) : 59.3 Heparin Dosing Weight: 76 kg  Vital Signs: Temp: 98.3 F (36.8 C) (03/18 1141) Temp Source: Oral (03/18 1141) BP: 109/62 (03/18 1141) Pulse Rate: 92 (03/18 1141)  Labs: Recent Labs     0000 03/14/20 0455 03/14/20 0457 03/14/20 1241 03/15/20 0550 03/15/20 0550 03/15/20 1607 03/16/20 0215 03/16/20 1026  HGB   < >  --  10.6*  --  10.9*  --   --  9.7*  --   HCT  --   --  34.0*  --  34.9*  --   --  31.9*  --   PLT  --   --  283  --  298  --   --  254  --   APTT  --  47*  --    < > 122*   < > 107* 100* 88*  HEPARINUNFRC   < > 1.05*  --   --  1.13*  --  1.10* 1.00*  --   CREATININE  --  0.70  --   --  0.56  --   --  0.65  --    < > = values in this interval not displayed.    Estimated Creatinine Clearance: 70 mL/min (by C-G formula based on SCr of 0.65 mg/dL).  Assessment: 74 y.o. femalewith a  history of  COPD, atrial fibrillation on Eliquis. Her last dose of Eliquis was 03/10/20 at 2334. She is being transitioned to a heparin drip in preparation for a possible chest tube placement. She was a patient at [facility] approximately one year ago where she received IV heparin. Data from that admission was used to guide initial dosing  Patient is still not consistently taking medications by mouth per RN.  Goal of Therapy:  Heparin level 0.3-0.7 units/ml once aPTT and Heparin level correlate.  aPTT 66 - 102 seconds Monitor platelets by anticoagulation protocol: Yes   Plan:   3/18 @ 1026 aPTT 88, therapeutic x2  Continue heparin drip at 1050 units/hr  Re-check aPTT/HL tomorrow morning  H&H worse, platelets stable. CBC daily while on heparin  Pharmacy will continue to follow.  [place] Resident 03/16/2020 11:50 AM

## 2020-03-17 ENCOUNTER — Ambulatory Visit: Payer: Medicare Other | Admitting: Family

## 2020-03-17 LAB — CBC
HCT: 36.6 % (ref 36.0–46.0)
Hemoglobin: 10.8 g/dL — ABNORMAL LOW (ref 12.0–15.0)
MCH: 25 pg — ABNORMAL LOW (ref 26.0–34.0)
MCHC: 29.5 g/dL — ABNORMAL LOW (ref 30.0–36.0)
MCV: 84.7 fL (ref 80.0–100.0)
Platelets: 243 10*3/uL (ref 150–400)
RBC: 4.32 MIL/uL (ref 3.87–5.11)
RDW: 21.2 % — ABNORMAL HIGH (ref 11.5–15.5)
WBC: 8.5 10*3/uL (ref 4.0–10.5)
nRBC: 0 % (ref 0.0–0.2)

## 2020-03-17 LAB — GLUCOSE, CAPILLARY
Glucose-Capillary: 104 mg/dL — ABNORMAL HIGH (ref 70–99)
Glucose-Capillary: 108 mg/dL — ABNORMAL HIGH (ref 70–99)
Glucose-Capillary: 81 mg/dL (ref 70–99)
Glucose-Capillary: 92 mg/dL (ref 70–99)
Glucose-Capillary: 93 mg/dL (ref 70–99)
Glucose-Capillary: 95 mg/dL (ref 70–99)

## 2020-03-17 LAB — APTT: aPTT: 83 seconds — ABNORMAL HIGH (ref 24–36)

## 2020-03-17 LAB — HEPARIN LEVEL (UNFRACTIONATED): Heparin Unfractionated: 1.03 IU/mL — ABNORMAL HIGH (ref 0.30–0.70)

## 2020-03-17 MED ORDER — CEFAZOLIN SODIUM-DEXTROSE 2-4 GM/100ML-% IV SOLN
2.0000 g | Freq: Three times a day (TID) | INTRAVENOUS | Status: DC
  Administered 2020-03-17 – 2020-03-20 (×10): 2 g via INTRAVENOUS
  Filled 2020-03-17 (×11): qty 100

## 2020-03-17 MED ORDER — IPRATROPIUM-ALBUTEROL 0.5-2.5 (3) MG/3ML IN SOLN: 3.0000 mL | Freq: Four times a day (QID) | RESPIRATORY_TRACT | Status: DC | PRN

## 2020-03-17 MED ORDER — DEXTROSE-NACL 5-0.45 % IV SOLN: INTRAVENOUS | Status: DC

## 2020-03-17 MED ORDER — DILTIAZEM HCL ER COATED BEADS 120 MG PO CP24
120.0000 mg | ORAL_CAPSULE | Freq: Every day | ORAL | Status: DC
  Administered 2020-03-17 – 2020-03-19 (×3): 120 mg via ORAL
  Filled 2020-03-17 (×3): qty 1

## 2020-03-17 MED ORDER — DEXTROSE-NACL 5-0.9 % IV SOLN: INTRAVENOUS | Status: DC

## 2020-03-17 MED ORDER — APIXABAN 5 MG PO TABS
5.0000 mg | ORAL_TABLET | Freq: Two times a day (BID) | ORAL | Status: DC
  Administered 2020-03-17 – 2020-03-20 (×7): 5 mg via ORAL
  Filled 2020-03-17 (×7): qty 1

## 2020-03-17 NOTE — TOC Progression Note (Signed)
Transition of Care [facility]) - Progression Note    Patient Details  Name: Brandy Armstrong MRN: [id number] Date of Birth: 10/04/1946  [facility] CM/SW Contact  Eileen Stanford, LCSW Phone Number: 03/17/2020, 3:20 PM  Clinical Narrative: Pt is a LTC resident at [facility]. However, per palliative note pt and family having another meeting Monday at 16 to determine plan.       Expected Discharge Plan: [place] Barriers to Discharge: Continued Medical Work up  Expected Discharge Plan and Services Expected Discharge Plan: [place] arrangements for the past 2 months: [place]                                       Social Determinants of Health (SDOH) Interventions    Readmission Risk Interventions No flowsheet data found.

## 2020-03-17 NOTE — Consult Note (Signed)
[place] for a heparin infusion Indication: afib on eliquis PTA  Patient Measurements: Height: 5\' 6"  (167.6 cm) Weight: 181 lb 14.4 oz (82.5 kg) IBW/kg (Calculated) : 59.3 Heparin Dosing Weight: 76 kg  Vital Signs: Temp: 98 F (36.7 C) (03/19 0401) Temp Source: Oral (03/19 0401) BP: 120/78 (03/19 0401) Pulse Rate: 97 (03/19 0401)  Labs: Recent Labs    03/15/20 0550 03/15/20 0550 03/15/20 1607 03/16/20 0215 03/16/20 1026 03/17/20 0505  HGB 10.9*   < >  --  9.7*  --  10.8*  HCT 34.9*  --   --  31.9*  --  36.6  PLT 298  --   --  254  --  243  APTT 122*   < > 107* 100* 88* 83*  HEPARINUNFRC 1.13*   < > 1.10* 1.00*  --  1.03*  CREATININE 0.56  --   --  0.65  --   --    < > = values in this interval not displayed.    Estimated Creatinine Clearance: 69.9 mL/min (by C-G formula based on SCr of 0.65 mg/dL).  Assessment: 74 y.o. femalewith a  history of  COPD, atrial fibrillation on Eliquis. Her last dose of Eliquis was 03/10/20 at 2334. She is being transitioned to a heparin drip in preparation for a possible chest tube placement. She was a patient at [facility] approximately one year ago where she received IV heparin. Data from that admission was used to guide initial dosing  Patient is still not consistently taking medications by mouth per RN.  Goal of Therapy:  Heparin level 0.3-0.7 units/ml once aPTT and Heparin level correlate.  aPTT 66 - 102 seconds Monitor platelets by anticoagulation protocol: Yes   Plan:   3/18 @ 1026 aPTT 88, therapeutic x2.  Continue heparin drip at 1050 units/hr  3/19 @ 0505 aPTT 83, therapeutic x 3.  CBC stable.  Continue heparin at 1050 units/hr.  Re-check aPTT/HL tomorrow morning  CBC daily while on heparin  Pharmacy will continue to follow.  Hart Robinsons, PharmD Clinical Pharmacist   03/17/2020 6:04 AM

## 2020-03-17 NOTE — Progress Notes (Signed)
Progress Note    Brandy Armstrong  [id number] DOB: 13-Dec-1946  DOA: 03/09/2020 PCP: [name], FNP      Brief Narrative:    Medical records reviewed and are as summarized below:  Brandy Armstrong is an 74 y.o. female with a known history of multiple medical problems that are mentioned below including COPD, atrial fibrillation, on Eliquis, peripheral neuropathy and rheumatoid arthritis, who was recently admitted for MSSA bacteremia of unclear etiology for which she continues to take IV antibiotic therapy through PICC line at [facility] where she resides.  She presents to the emergency room with acute onset of worsening dyspnea with hypoxemia to 85% on room air with associated cough occasionally productive of yellowish sputum without fever or chills. The patient underwent ultrasound-guided right thoracentesis in the ER with drainage of 1 L of pleural fluid.      Assessment/Plan:   Active Problems:   Pleural effusion  Exudative Right Pleural Effusion  Acute Hypoxic Respiratory Failure:  Currently requiring 2 L [place] S/p thoracentesis in ED with 1 L serosanguinous fluid removed on 3/11 3/12 with thoracentesis by IR with 1.2 L bloody pleural fluid -> c/b R basilar ex vacuo hydropneumothorax Repeat CXR 3/12 with interval reduction of R sided pleural effusion -> tiny R basilar ex vacuo hydropneumothorax.  Improved aeration of the R mid and lower lung with persistent R medial basilar heterogeneous/consolidative opacities (atelectasis vs infiltrate) CXR 3/13 with mild interval worsening in lung aeration on the R consistent with increase in R pleural fluid and atelectasis CXR 3/14 with moderate layering R pleural effusion with R lower lobe atelectasis CXR 3/15 with small R effusion and R basilar atelectasis CT chest on 3/15 notable for small R sided effusion with mild loculation at posterior R upper lobe (overall decreased in size) - residual  consolidation, atelectasis vs pneumonia. Exudative by lights No malignancy on pleural fluid cytology No growth from pleural fluid culture quant gold (negative), AFB smear from pleural fluid is negative (AFB cx is pending). Discussed with Dr. Patsey Berthold. Ok to discontinue airborne isolation. Hold plaquenil and adalimumab     Delirium:  Improving. Per family, she's been declining from mental status standpoint (and in general) over the past years and has been confused when hospitalized before. Follow TSH (wnl), B12 (wnl), folate (wnl), VBG (without hypercarbia) and head CT (without acute abnormality) She has poor PO intake and is not taking meds   Recent MSSA bacteremia. Switch IV cefepime to IV Ancef to continue through 03/26/2020 as previously scheduled.   COPD. Continue bronchodilators   Rheumatoid arthritis. -We willholdPlaquenilgiven prolonged QT interval. - Repeat EKG -> improved qtc   Hypertension. -We will continue HCTZ and Cardizem CD.   Atrial fibrillation with mildly rapid ventricular response, likely secondary to #1. Discontinue IV heparin and restart Eliquis.  Change IV Cardizem infusion to oral Cardizem.   Anxiety. -We will continue BuSpar.   DVT prophylaxis. Restart Eliquis  # Hypokalemia: replace and follow  Patient is being followed by the hospice team.  Hospice team will meet with family tomorrow to discuss options and finalize plans.    Body mass index is 29.36 kg/m.   Family Communication/Anticipated D/C date and plan/Code Status   DVT prophylaxis: Eliquis Code Status: Use BiPAP but no CPR. DNI Family Communication: Plan discussed with her family at the bedside(Ginger and Jana Half).  [name] was on speaker phone Disposition Plan: Patient is from SNF. Plan to discharge to SNF when  cleared by pulmonologist for discharge.      Subjective:   Patient is unable to provide any history and she was able to ask "why am I  here?"  Objective:    Vitals:   03/16/20 2042 03/17/20 0401 03/17/20 0800 03/17/20 1154  BP: 115/76 120/78 (!) 158/65 (!) 158/81  Pulse: 99 97 90 83  Resp: 20 20 18 16   Temp: 99 F (37.2 C) 98 F (36.7 C) 98.2 F (36.8 C) 98.1 F (36.7 C)  TempSrc: Oral Oral Oral Axillary  SpO2: 93% 95% 98% 100%  Weight:  82.5 kg    Height:        Intake/Output Summary (Last 24 hours) at 03/17/2020 1258 Last data filed at 03/17/2020 1158 Gross per 24 hour  Intake 1620.08 ml  Output 300 ml  Net 1320.08 ml   Filed Weights   03/15/20 0437 03/16/20 0446 03/17/20 0401  Weight: 80.5 kg 82.9 kg 82.5 kg    Exam:  GEN: NAD SKIN: Warm and dry EYES: EOMI ENT: MMM CV: RRR PULM: CTA B ABD: soft, ND, NT, +BS CNS: Alert and oriented to person. She doesn't follow commands. EXT: No edema or tenderness   Data Reviewed:   I have personally reviewed following labs and imaging studies:  Labs: Labs show the following:   Basic Metabolic Panel: Recent Labs  Lab 03/12/20 0714 03/12/20 0714 03/12/20 2141 03/12/20 2141 03/13/20 0907 03/13/20 0907 03/13/20 1113 03/13/20 1113 03/14/20 0455 03/14/20 0455 03/15/20 0550 03/16/20 0215  NA 135   < > 135   < > 129*  --  133*  --  136  --  135 134*  K 3.1*   < > 3.0*   < > 3.0*   < > 2.9*   < > 3.4*   < > 3.3* 4.1  CL 87*   < > 84*   < > 79*  --  80*  --  82*  --  90* 94*  CO2 36*   < > 36*   < > 36*  --  41*  --  38*  --  34* 29  GLUCOSE 82   < > 70   < > 411*  --  162*  --  106*  --  105* 94  BUN 14   < > 15   < > 16  --  17  --  22  --  23 22  CREATININE 0.50   < > 0.54   < > 0.62  --  0.60  --  0.70  --  0.56 0.65  CALCIUM 8.5*   < > 8.6*   < > 8.5*  --  8.6*  --  8.8*  --  8.6* 8.2*  MG 1.7   < > 1.5*  --  1.8  --   --   --  1.8  --  1.7 2.2  PHOS 2.5  --   --   --  2.2*  --   --   --  2.9  --  2.6 2.3*   < > = values in this interval not displayed.   GFR Estimated Creatinine Clearance: 69.9 mL/min (by C-G formula based on SCr of 0.65  mg/dL). Liver Function Tests: Recent Labs  Lab 03/11/20 0421 03/12/20 0714 03/13/20 0907 03/14/20 0455 03/15/20 0550  AST 16 19 19 19 17   ALT <5 5 6 6 5   ALKPHOS 80 92 96 94 88  BILITOT 0.6 1.0 1.5* 1.5* 1.4*  PROT 6.3*  6.5 7.2 6.8 6.6  ALBUMIN 2.3* 2.4* 2.6* 2.4* 2.4*   No results for input(s): LIPASE, AMYLASE in the last 168 hours. No results for input(s): AMMONIA in the last 168 hours. Coagulation profile No results for input(s): INR, PROTIME in the last 168 hours.  CBC: Recent Labs  Lab 03/11/20 0421 03/11/20 0421 03/12/20 0714 03/12/20 0714 03/13/20 0907 03/14/20 0457 03/15/20 0550 03/16/20 0215 03/17/20 0505  WBC 6.9   < > 5.5   < > 6.8 7.9 7.2 8.0 8.5  NEUTROABS 5.0  --  4.1  --  5.0 5.8 5.3  --   --   HGB 9.5*   < > 10.0*   < > 10.6* 10.6* 10.9* 9.7* 10.8*  HCT 33.7*   < > 34.7*   < > 35.8* 34.0* 34.9* 31.9* 36.6  MCV 85.1   < > 84.4   < > 82.3 80.0 80.2 81.8 84.7  PLT 277   < > 251   < > 268 283 298 254 243   < > = values in this interval not displayed.   Cardiac Enzymes: No results for input(s): CKTOTAL, CKMB, CKMBINDEX, TROPONINI in the last 168 hours. BNP (last 3 results) No results for input(s): PROBNP in the last 8760 hours. CBG: Recent Labs  Lab 03/16/20 2044 03/17/20 0004 03/17/20 0408 03/17/20 0803 03/17/20 1155  GLUCAP 97 95 93 104* 108*   D-Dimer: No results for input(s): DDIMER in the last 72 hours. Hgb A1c: No results for input(s): HGBA1C in the last 72 hours. Lipid Profile: No results for input(s): CHOL, HDL, LDLCALC, TRIG, CHOLHDL, LDLDIRECT in the last 72 hours. Thyroid function studies: No results for input(s): TSH, T4TOTAL, T3FREE, THYROIDAB in the last 72 hours.  Invalid input(s): FREET3 Anemia work up: No results for input(s): VITAMINB12, FOLATE, FERRITIN, TIBC, IRON, RETICCTPCT in the last 72 hours. Sepsis Labs: Recent Labs  Lab 03/14/20 0457 03/15/20 0550 03/16/20 0215 03/17/20 0505  WBC 7.9 7.2 8.0 8.5     Microbiology Recent Results (from the past 240 hour(s))  Body fluid culture (includes gram stain)     Status: None   Collection Time: 03/09/20  6:52 PM   Specimen: Pleural Fluid  Result Value Ref Range Status   Specimen Description   Final    PLEURAL Performed at [facility], [address], [place], [place] [address]    Special Requests   Final    PLEURAL Performed at [facility], [address] [place], [place] [address]    Gram Stain   Final    FEW WBC PRESENT, PREDOMINANTLY MONONUCLEAR NO ORGANISMS SEEN Performed at [facility], [place] [address], [place], [place] [address]    Culture NO GROWTH  Final   Report Status 03/13/2020 FINAL  Final  Acid Fast Smear (AFB)     Status: None   Collection Time: 03/09/20  6:52 PM  Result Value Ref Range Status   AFB Specimen Processing Concentration  Final   Acid Fast Smear Negative  Final    Comment: (NOTE) Performed At: [facility] [place], [place] [id number] Rush Farmer MD [id number]    Source (AFB) PLEURAL  Final  Respiratory Panel by RT PCR (Flu A&B, Covid) - Nasopharyngeal Swab     Status: None   Collection Time: 03/09/20  8:37 PM   Specimen: Nasopharyngeal Swab  Result Value Ref Range Status   SARS Coronavirus 2 by RT PCR NEGATIVE NEGATIVE Final    Comment: (NOTE)  SARS-CoV-2 target nucleic acids are NOT DETECTED. The SARS-CoV-2 RNA is generally detectable in upper respiratoy specimens during the acute phase of infection. The lowest concentration of SARS-CoV-2 viral copies this assay can detect is 131 copies/mL. A negative result does not preclude SARS-Cov-2 infection and should not be used as the sole basis for treatment or other patient management decisions. A negative result may occur with  improper specimen collection/handling, submission of specimen other than nasopharyngeal swab, presence of viral mutation(s) within the areas targeted by this assay, and  inadequate number of viral copies (<131 copies/mL). A negative result must be combined with clinical observations, patient history, and epidemiological information. The expected result is Negative. Fact Sheet for Patients:  PinkCheek.be Fact Sheet for Healthcare Providers:  GravelBags.it This test is not yet ap proved or cleared by the [place] FDA and  has been authorized for detection and/or diagnosis of SARS-CoV-2 by FDA under an Emergency Use Authorization (EUA). This EUA will remain  in effect (meaning this test can be used) for the duration of the COVID-19 declaration under Section 564(b)(1) of the Act, 21 U.S.C. section 360bbb-3(b)(1), unless the authorization is terminated or revoked sooner.    Influenza A by PCR NEGATIVE NEGATIVE Final   Influenza B by PCR NEGATIVE NEGATIVE Final    Comment: (NOTE) The Xpert Xpress SARS-CoV-2/FLU/RSV assay is intended as an aid in  the diagnosis of influenza from Nasopharyngeal swab specimens and  should not be used as a sole basis for treatment. Nasal washings and  aspirates are unacceptable for Xpert Xpress SARS-CoV-2/FLU/RSV  testing. Fact Sheet for Patients: PinkCheek.be Fact Sheet for Healthcare Providers: GravelBags.it This test is not yet approved or cleared by the [place] FDA and  has been authorized for detection and/or diagnosis of SARS-CoV-2 by  FDA under an Emergency Use Authorization (EUA). This EUA will remain  in effect (meaning this test can be used) for the duration of the  Covid-19 declaration under Section 564(b)(1) of the Act, 21  U.S.C. section 360bbb-3(b)(1), unless the authorization is  terminated or revoked. Performed at [facility], [address] [place], [place] [address]   Culture, blood (routine x 2)     Status: None   Collection Time: 03/09/20  9:31 PM   Specimen: BLOOD   Result Value Ref Range Status   Specimen Description BLOOD LEFT ASSIST CONTROL  Final   Special Requests   Final    BOTTLES DRAWN AEROBIC AND ANAEROBIC Blood Culture adequate volume   Culture   Final    NO GROWTH 5 DAYS Performed at [facility], [address] [place], [place] [address]    Report Status 03/14/2020 FINAL  Final  Culture, blood (routine x 2)     Status: None   Collection Time: 03/09/20  9:45 PM   Specimen: BLOOD  Result Value Ref Range Status   Specimen Description BLOOD RIGHT ASSIST CONTROL  Final   Special Requests   Final    BOTTLES DRAWN AEROBIC AND ANAEROBIC Blood Culture adequate volume   Culture   Final    NO GROWTH 5 DAYS Performed at [facility], [address] [place], [place] [address]    Report Status 03/14/2020 FINAL  Final  MRSA PCR Screening     Status: None   Collection Time: 03/10/20  3:58 PM   Specimen: Nasopharyngeal  Result Value Ref Range Status   MRSA by PCR NEGATIVE NEGATIVE Final    Comment:  The GeneXpert MRSA Assay (FDA approved for NASAL specimens only), is one component of a comprehensive MRSA colonization surveillance program. It is not intended to diagnose MRSA infection nor to guide or monitor treatment for MRSA infections. Performed at [facility], [address] [place], [place] [address]     Procedures and diagnostic studies:  No results found.  Medications:   . apixaban  5 mg Oral BID  . busPIRone  10 mg Oral TID  . Chlorhexidine Gluconate Cloth  6 each Topical Daily  . diltiazem  120 mg Oral Daily  . feeding supplement (NEPRO CARB STEADY)  237 mL Oral BID BM  . guaiFENesin  600 mg Oral BID  . ipratropium-albuterol  3 mL Nebulization TID  . lipase/protease/amylase  36,000 Units Oral TID WC  . magnesium oxide  200 mg Oral BID  . pantoprazole  40 mg Oral Daily  . sertraline  50 mg Oral Daily  . sodium chloride flush  10-40 mL Intracatheter Q12H  . traZODone  50 mg Oral  QHS   Continuous Infusions: . sodium chloride Stopped (03/15/20 0856)  .  ceFAZolin (ANCEF) IV    . dextrose 5 % and 0.45% NaCl       LOS: 8 days   Smith Potenza  [facility]     03/17/2020, 12:58 PM

## 2020-03-17 NOTE — Progress Notes (Addendum)
Daily Progress Note   Patient Name: Brandy Armstrong       Date: 03/17/2020 DOB: Aug 30, 1946  Age: 74 y.o. MRN#: [id number] Attending Physician: Jennye Boroughs, MD Primary Care Physician: [name], FNP Admit Date: 03/09/2020  Reason for Consultation/Follow-up: Establishing goals of care  Subjective: Patient is resting in bed. [name] and Ginger are at bedside. Patient is more oriented and conversational today. She tells me her QOL had changed since her previous hospitalization a year ago. She states she felt she has declined since then.   We discussed her diagnosis, prognosis, GOC, EOL wishes disposition and options.  A detailed discussion was had today regarding advanced directives.  Concepts specific to code status, artifical feeding and hydration, IV antibiotics and rehospitalization were discussed.  The difference between an aggressive medical intervention path and a comfort care path was discussed.  Values and goals of care important to patient and family were attempted to be elicited.  Discussed limitations of medical interventions to prolong quality of life in some situations and discussed the concept of human mortality.  Primary MD in to bedside briefly to answer questions. She states she understands her status. She does not want a feeding tube. Discussed concern for rehospitalization following decline if her intake is not enough. Discussed a comfort based approach to care. She states she will consider this and decide on care moving forward. No changes to care at this time.  Will follow up Monday.   Length of Stay: 8  Current Medications: Scheduled Meds:  . apixaban  5 mg Oral BID  . busPIRone  10 mg Oral TID  . Chlorhexidine Gluconate Cloth  6 each Topical Daily  . diltiazem   120 mg Oral Daily  . feeding supplement (NEPRO CARB STEADY)  237 mL Oral BID BM  . guaiFENesin  600 mg Oral BID  . ipratropium-albuterol  3 mL Nebulization TID  . lipase/protease/amylase  36,000 Units Oral TID WC  . magnesium oxide  200 mg Oral BID  . pantoprazole  40 mg Oral Daily  . sertraline  50 mg Oral Daily  . sodium chloride flush  10-40 mL Intracatheter Q12H  . traZODone  50 mg Oral QHS    Continuous Infusions: . sodium chloride Stopped (03/15/20 0856)  .  ceFAZolin (ANCEF) IV    . dextrose  5 % and 0.45% NaCl      PRN Meds: sodium chloride, albuterol, calcium carbonate, chlorpheniramine-HYDROcodone, cyclobenzaprine, hydrOXYzine, metoprolol tartrate, morphine injection, promethazine, sodium chloride flush  Physical Exam Pulmonary:     Effort: Pulmonary effort is normal.  Neurological:     Mental Status: She is alert.             Vital Signs: BP (!) 158/81 (BP Location: Left Leg)   Pulse 83   Temp 98.1 F (36.7 C) (Axillary)   Resp 16   Ht 5\' 6"  (1.676 m)   Wt 82.5 kg   SpO2 100%   BMI 29.36 kg/m  SpO2: SpO2: 100 % O2 Device: O2 Device: Nasal Cannula O2 Flow Rate: O2 Flow Rate (L/min): 2 L/min  Intake/output summary:   Intake/Output Summary (Last 24 hours) at 03/17/2020 1325 Last data filed at 03/17/2020 1158 Gross per 24 hour  Intake 1620.08 ml  Output 300 ml  Net 1320.08 ml   LBM: Last BM Date: 03/17/20 Baseline Weight: Weight: 81.6 kg Most recent weight: Weight: 82.5 kg       Palliative Assessment/Data:      Patient Active Problem List   Diagnosis Date Noted  . Pleural effusion 03/09/2020  . MSSA bacteremia 02/17/2020  . Pyelonephritis 02/17/2020  . Acute hypoxemic respiratory failure ([place]) 02/17/2020  . Acute encephalopathy 02/17/2020  . Thrombocytopenia ([place]) 02/17/2020  . Sepsis ([place]) 02/16/2020  . Acute lower UTI 02/16/2020  . Toxic metabolic encephalopathy   . Pancreatic insufficiency 01/17/2020  . Pancreatic divisum 01/17/2020   . 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 MDD (major depressive disorder), recurrent episode, moderate ([place]) 08/18/2019  . GAD (generalized anxiety disorder) 08/18/2019  . Panic attacks 08/18/2019  . Cognitive disorder 08/18/2019  . Chest pain 07/09/2019  . Greater trochanteric bursitis of left hip 05/21/2019  . Anesthesia of skin 05/10/2019  . Loose stools 04/07/2019  . Anxiety 04/02/2019  . Appendicitis with perforation 03/09/2019  . Acute appendicitis   . Acute appendicitis with localized peritonitis 02/19/2019  . Chronic diastolic CHF (congestive heart failure) ([place]) 02/10/2019  . Atherosclerosis of aorta ([place]) 02/05/2019  . Bronchitis 12/28/2018  . AAA (abdominal aortic aneurysm) without rupture ([place]) 12/17/2018  . COPD exacerbation ([place]) 12/09/2018  . Iron deficiency anemia due to chronic blood loss 11/03/2018  . Obesity, Class II, BMI 35-39.9 09/23/2018  . Primary osteoarthritis of right knee 09/23/2018  . GERD (gastroesophageal reflux disease) 08/24/2018  . Hives 06/17/2018  . B12 deficiency 06/17/2018  . Anxiety and depression 03/23/2018  . Purpura ([place]) 08/06/2017  . Radicular pain in left arm 09/30/2016  . Cellulitis 09/26/2016  . Abscess 09/24/2016  . PAD (peripheral artery disease) ([place]) 08/13/2016  . Anemia 08/13/2016  . Urinary urgency 08/06/2016  . Thoracic back pain 05/20/2016  . Neuropathy 10/05/2015  . Tremors of nervous system 06/18/2015  . GI bleed due to NSAIDs 05/04/2015  . Dysuria 03/07/2015  . Atrial fibrillation with RVR ([place]) 03/11/2014  . COPD (chronic obstructive pulmonary disease) ([place]) 03/11/2014  . Flexor hallucis longus tendinitis 11/09/2013  . Vertigo 09/16/2013  . Chronic steroid use 09/11/2013  . Osteoarthritis 09/11/2013  . Shortness of breath 08/18/2013  . Generalized weakness 08/18/2013  . Barrett's esophagus 08/18/2013  . Chronic diarrhea 08/18/2013  . Numbness and tingling 08/18/2013  . Screening for breast  cancer 08/18/2013  . Rheumatoid arthritis ([place]) 05/31/2013  . Seborrheic keratosis 12/14/2012  . Benign neoplasm of colon 11/23/2012  . Family history of malignant neoplasm  of gastrointestinal tract 11/23/2012  . Prediabetes 12/16/2011    Palliative Care Assessment & Plan    Recommendations/Plan: Family meeting Monday at 12:00.     Code Status:    Code Status Orders  (From admission, onward)         Start     Ordered   03/14/20 1516  Limited resuscitation (code)  Continuous    Question Answer Comment  In the event of cardiac or respiratory ARREST: Initiate Code Blue, Call Rapid Response Yes   In the event of cardiac or respiratory ARREST: Perform CPR No   In the event of cardiac or respiratory ARREST: Perform Intubation/Mechanical Ventilation No   In the event of cardiac or respiratory ARREST: Use NIPPV/BiPAp only if indicated Yes   In the event of cardiac or respiratory ARREST: Administer ACLS medications if indicated No   In the event of cardiac or respiratory ARREST: Perform Defibrillation or Cardioversion if indicated No      03/14/20 1516        Code Status History    Date Active Date Inactive Code Status Order ID Comments User Context   03/09/2020 2037 03/14/2020 1516 Full Code [id number]  Mansy, Arvella Merles, MD ED   02/16/2020 1405 02/24/2020 0316 Full Code [id number]  Louellen Molder, MD ED   02/19/2019 0955 03/09/2019 0003 Full Code [id number]  Herbert Pun, MD ED   08/13/2016 2338 08/14/2016 1818 Full Code [id number]  Gladstone Lighter, MD Inpatient   05/04/2015 1355 05/08/2015 2116 Full Code [id number]  Loletha Grayer, MD ED   Advance Care Planning Activity    Advance Directive Documentation     Most Recent Value  Type of Advance Directive  Out of facility DNR (pink MOST or yellow form), Healthcare Power of Attorney  Pre-existing out of facility DNR order (yellow form or pink MOST form)  --  "MOST" Form in Place?  --      Prognosis:  Poor    Thank you for  allowing the Palliative Medicine Team to assist in the care of this patient.   Time In: 12:00 Time Out: 1:30 Total Time 90 min Prolonged Time Billed  yes      Greater than 50%  of this time was spent counseling and coordinating care related to the above assessment and plan.  Asencion Gowda, NP  Please contact Palliative Medicine Team phone at [phone] for questions and concerns.

## 2020-03-17 NOTE — Progress Notes (Signed)
SLP Cancellation Note  Patient Details Name: Brandy Armstrong MRN: [id number] DOB: 1946/07/23   Cancelled treatment:       Reason Eval/Treat Not Completed: Patient declined, no reason specified(chart reviewed). Noted labs. Upon entering room, pt much more verbally engaged today. She is able to converse in basic conversation. When asked if she would take part in po trials w/ SLP, she declined. Offered different po's and drinks, but she continued to decline. Encouraged her to think about certain foods to try today/tomorrow, and she agreed.  Will attempt solid food trials tomorrow in hopes to upgrade diet consistency to more solid foods -- hope the solid foods will encourage oral intake overall. Pt could not identify anything she wanted to try to eat.  ST services will f/u w/ trials of solid foods next session. Suspect pt will be able to adequately tolerate solids as no oral-lingual weakness is noted. Pt tolerates sips and bites adequately w/ no overt clinical s/s of aspiration noted currently. Recommend continued f/u w/ oral care for hygiene and stimulation of swallowing; Sweet Tea.     Orinda Kenner, MS, CCC-SLP Shaunna Rosetti 03/17/2020, 3:04 PM

## 2020-03-18 ENCOUNTER — Inpatient Hospital Stay: Payer: Medicare Other

## 2020-03-18 DIAGNOSIS — J189 Pneumonia, unspecified organism: Secondary | ICD-10-CM | POA: Diagnosis present

## 2020-03-18 LAB — CBC
HCT: 30.3 % — ABNORMAL LOW (ref 36.0–46.0)
Hemoglobin: 9 g/dL — ABNORMAL LOW (ref 12.0–15.0)
MCH: 25.1 pg — ABNORMAL LOW (ref 26.0–34.0)
MCHC: 29.7 g/dL — ABNORMAL LOW (ref 30.0–36.0)
MCV: 84.4 fL (ref 80.0–100.0)
Platelets: 244 10*3/uL (ref 150–400)
RBC: 3.59 MIL/uL — ABNORMAL LOW (ref 3.87–5.11)
RDW: 20.9 % — ABNORMAL HIGH (ref 11.5–15.5)
WBC: 7.4 10*3/uL (ref 4.0–10.5)
nRBC: 0 % (ref 0.0–0.2)

## 2020-03-18 LAB — GLUCOSE, CAPILLARY
Glucose-Capillary: 70 mg/dL (ref 70–99)
Glucose-Capillary: 79 mg/dL (ref 70–99)
Glucose-Capillary: 80 mg/dL (ref 70–99)
Glucose-Capillary: 82 mg/dL (ref 70–99)
Glucose-Capillary: 83 mg/dL (ref 70–99)
Glucose-Capillary: 94 mg/dL (ref 70–99)

## 2020-03-18 NOTE — Plan of Care (Signed)
  Problem: Pain Managment: Goal: General experience of comfort will improve Outcome: Progressing   Problem: Safety: Goal: Ability to remain free from injury will improve Outcome: Progressing   

## 2020-03-18 NOTE — Progress Notes (Signed)
Physical Therapy Treatment Patient Details Name: Brandy Armstrong MRN: [id number] DOB: Jan 24, 1946 Today's Date: 03/18/2020    History of Present Illness 74 y.o. female, with history of A. fib on anticoagulation, chronic pancreatic insufficiency, chronic back pain COPD, rheumatoid arthritis here with pleural effusion with possible chest tube placement. Recent admission.    PT Comments    Patient agrees to PT treatment, but reports that she is too uncomfortable to perform supine <> sit due to constant burning in urine and feeling raw. She performs BLE AROM and AAROM including: heel slides, SLR, hip abd/add, SAQ, ankle pump, quad set, glut set x 10; patient loses focus often and needs mod/max cues to participate. She will continue to benefit from skilled PT to improve strength and mobility.  Follow Up Recommendations  SNF     Equipment Recommendations  None recommended by PT    Recommendations for Other Services       Precautions / Restrictions Restrictions Weight Bearing Restrictions: No    Mobility  Bed Mobility Overal bed mobility: (Patient decliines due to being too uncomfortable)                Transfers                    Ambulation/Gait                 Stairs             Wheelchair Mobility    Modified Rankin (Stroke Patients Only)       Balance                                            Cognition Arousal/Alertness: Awake/alert Behavior During Therapy: Flat affect(slow to respond to commands) Overall Cognitive Status: Within Functional Limits for tasks assessed Area of Impairment: Attention                                      Exercises Total Joint Exercises Ankle Circles/Pumps: AAROM;Strengthening;Right;Left;10 reps Quad Sets: Strengthening;Right;Left;Both;10 reps Gluteal Sets: Strengthening;Both;10 reps Towel Squeeze: AROM;Strengthening;Both;10 reps Long Arc Quad: AROM;Strengthening;Both;10  reps Marching in Standing: AROM;Strengthening;Both;5 reps    General Comments        Pertinent Vitals/Pain Pain Assessment: Faces Faces Pain Scale: Hurts even more Pain Location: (bottom) Pain Descriptors / Indicators: Aching    Home Living                      Prior Function            PT Goals (current goals can now be found in the care plan section) Progress towards PT goals: Progressing toward goals    Frequency    Min 2X/week      PT Plan Current plan remains appropriate    Co-evaluation              AM-PAC PT "6 Clicks" Mobility   Outcome Measure  Help needed turning from your back to your side while in a flat bed without using bedrails?: A Lot Help needed moving from lying on your back to sitting on the side of a flat bed without using bedrails?: A Lot Help needed moving to and from a bed to a chair (including a wheelchair)?: A Lot Help needed  standing up from a chair using your arms (e.g., wheelchair or bedside chair)?: A Lot Help needed to walk in hospital room?: A Lot Help needed climbing 3-5 steps with a railing? : Total 6 Click Score: 11    End of Session   Activity Tolerance: Patient limited by pain Patient left: in bed;with bed alarm set Nurse Communication: Mobility status PT Visit Diagnosis: Muscle weakness (generalized) (M62.81)     Time: 1120-1140 PT Time Calculation (min) (ACUTE ONLY): 20 min  Charges:  $Therapeutic Exercise: 8-22 mins                        Alanson Puls, PT DPT 03/18/2020, 11:58 AM

## 2020-03-18 NOTE — TOC Progression Note (Addendum)
Transition of Care [facility]) - Progression Note    Patient Details  Name: Brandy Armstrong MRN: [id number] Date of Birth: 1946-01-17  Transition of Care [facility]) CM/SW Contact  Marshell Garfinkel, RN Phone Number: 03/18/2020, 3:59 PM  Clinical Narrative:    Message received from MD requesting update on transition plan- MD plans to speak with family as palliative is planning to meet with family on Monday. I spoke with [facility] rep and patient will need new Covid screen and insurance authorization. I have left message for [facility]. Update at 1612: callback from [place] with ref # [id number] pending SNF auth  Expected Discharge Plan: [place] Barriers to Discharge: Continued Medical Work up  Expected Discharge Plan and Services Expected Discharge Plan: [place] arrangements for the past 2 months: [place]                                       Social Determinants of Health (SDOH) Interventions    Readmission Risk Interventions No flowsheet data found.

## 2020-03-18 NOTE — Progress Notes (Addendum)
Progress Note    Brandy Armstrong  [id number] DOB: 1946-03-23  DOA: 03/09/2020 PCP: [name], FNP      Brief Narrative:    Medical records reviewed and are as summarized below:  Brandy Armstrong is an 74 y.o. female with a known history of multiple medical problems that are mentioned below including COPD, atrial fibrillation, on Eliquis, peripheral neuropathy and rheumatoid arthritis, who was recently admitted for MSSA bacteremia of unclear etiology for which she continues to take IV antibiotic therapy through PICC line at [facility] skilled nursing facility where she resides.  She presents to the emergency room with acute onset of worsening dyspnea with hypoxemia to 85% on room air with associated cough occasionally productive of yellowish sputum without fever or chills. The patient underwent ultrasound-guided right thoracentesis in the ER with drainage of 1 L of pleural fluid.  She was treated with IV antibiotics.  She completed 8 days of IV cefepime.  She was previously on IV Ancef for MSSA bacteremia and this has been resumed.  She will continue IV Ancef through 03/26/2020.  She also developed atrial fibrillation with rapid ventricular response and required IV Cardizem drip for rate control.  She was previously on Eliquis but she was refusing to take medication so she was treated with IV heparin infusion.  Her condition has slowly improved and IV Cardizem has been switched to oral Cardizem and IV heparin infusion has been switched to Eliquis.    Assessment/Plan:   Principal Problem:   Pleural effusion Active Problems:   Pneumonia  Exudative Right Pleural Effusion  Acute Hypoxic Respiratory Failure:  Currently requiring 2 L [place] S/p thoracentesis in ED with 1 L serosanguinous fluid removed on 3/11 3/12 with thoracentesis by IR with 1.2 L bloody pleural fluid -> c/b R basilar ex vacuo hydropneumothorax Repeat CXR 3/12 with interval reduction of R sided pleural effusion  -> tiny R basilar ex vacuo hydropneumothorax.  Improved aeration of the R mid and lower lung with persistent R medial basilar heterogeneous/consolidative opacities (atelectasis vs infiltrate) CXR 3/13 with mild interval worsening in lung aeration on the R consistent with increase in R pleural fluid and atelectasis CXR 3/14 with moderate layering R pleural effusion with R lower lobe atelectasis CXR 3/15 with small R effusion and R basilar atelectasis CT chest on 3/15 notable for small R sided effusion with mild loculation at posterior R upper lobe (overall decreased in size) - residual consolidation, atelectasis vs pneumonia.   Repeat chest x-ray today. Exudative by lights No malignancy on pleural fluid cytology No growth from pleural fluid culture quant gold (negative), AFB smear from pleural fluid is negative (AFB cx is pending).  Hold plaquenil and adalimumab     Delirium:  Improving.  She is close to her baseline. TSH (wnl), B12 (wnl), folate (wnl), VBG (without hypercarbia) and head CT (without acute abnormality) She has poor PO intake and is not taking meds   Recent MSSA bacteremia. Continue IV Ancef  through 03/26/2020 as previously scheduled.   COPD. Continue bronchodilators   Rheumatoid arthritis. -We willholdPlaquenilgiven prolonged QT interval. - Repeat EKG -> improved qtc   Hypertension. -We will continue HCTZ and Cardizem CD.   Atrial fibrillation with RVR: Heart rate has improved. Continue oral Cardizem and Eliquis   Anxiety. -We will continue BuSpar.   DVT prophylaxis. Restart Eliquis  # Hypokalemia:  Improved  Patient is being followed by the hospice team.  Hospice team will meet with family  on Monday, 03/20/2020    Body mass index is 29.22 kg/m.   Family Communication/Anticipated D/C date and plan/Code Status   DVT prophylaxis: Eliquis Code Status: Use BiPAP but no CPR. DNI Family Communication: Plan discussed with her family  (Ginger and [name]) over the phone.   Disposition Plan: Patient is from SNF.  Patient is medically stable for discharge.  Plan to discharge to SNF in 1 to 2 days.     Subjective:   She has no complaints.  She feels much better today.  Objective:    Vitals:   03/18/20 0513 03/18/20 0733 03/18/20 1203 03/18/20 1623  BP: 117/85 120/79 114/75 130/68  Pulse: (!) 106 (!) 108 98 95  Resp: 18 18    Temp: 98.6 F (37 C) 98.1 F (36.7 C) 98.2 F (36.8 C) 98 F (36.7 C)  TempSrc: Oral     SpO2: 94% 94% 96% 95%  Weight: 82.1 kg     Height:        Intake/Output Summary (Last 24 hours) at 03/18/2020 1630 Last data filed at 03/18/2020 1455 Gross per 24 hour  Intake 380 ml  Output 300 ml  Net 80 ml   Filed Weights   03/16/20 0446 03/17/20 0401 03/18/20 0513  Weight: 82.9 kg 82.5 kg 82.1 kg    Exam:  GEN: NAD SKIN: Warm and dry EYES: EOMI ENT: MMM.  No ulcers or thrush seen in the mouth.  Patient has some brownish discoloration on the roof of her mouth.  (I asked Janett Billow, her nurse, to try and clean the area with a foam swab) CV: RRR PULM: No wheezing or rales heard ABD: soft, ND, NT, +BS CNS: Alert and oriented to person, place and time.  She was able to tell me that time on the clock but she does not know the day of the week. EXT: No edema or tenderness   Data Reviewed:   I have personally reviewed following labs and imaging studies:  Labs: Labs show the following:   Basic Metabolic Panel: Recent Labs  Lab 03/12/20 0714 03/12/20 0714 03/12/20 2141 03/12/20 2141 03/13/20 0907 03/13/20 0907 03/13/20 1113 03/13/20 1113 03/14/20 0455 03/14/20 0455 03/15/20 0550 03/16/20 0215  NA 135   < > 135   < > 129*  --  133*  --  136  --  135 134*  K 3.1*   < > 3.0*   < > 3.0*   < > 2.9*   < > 3.4*   < > 3.3* 4.1  CL 87*   < > 84*   < > 79*  --  80*  --  82*  --  90* 94*  CO2 36*   < > 36*   < > 36*  --  41*  --  38*  --  34* 29  GLUCOSE 82   < > 70   < > 411*  --  162*   --  106*  --  105* 94  BUN 14   < > 15   < > 16  --  17  --  22  --  23 22  CREATININE 0.50   < > 0.54   < > 0.62  --  0.60  --  0.70  --  0.56 0.65  CALCIUM 8.5*   < > 8.6*   < > 8.5*  --  8.6*  --  8.8*  --  8.6* 8.2*  MG 1.7   < > 1.5*  --  1.8  --   --   --  1.8  --  1.7 2.2  PHOS 2.5  --   --   --  2.2*  --   --   --  2.9  --  2.6 2.3*   < > = values in this interval not displayed.   GFR Estimated Creatinine Clearance: 69.6 mL/min (by C-G formula based on SCr of 0.65 mg/dL). Liver Function Tests: Recent Labs  Lab 03/12/20 0714 03/13/20 0907 03/14/20 0455 03/15/20 0550  AST 19 19 19 17   ALT 5 6 6 5   ALKPHOS 92 96 94 88  BILITOT 1.0 1.5* 1.5* 1.4*  PROT 6.5 7.2 6.8 6.6  ALBUMIN 2.4* 2.6* 2.4* 2.4*   No results for input(s): LIPASE, AMYLASE in the last 168 hours. No results for input(s): AMMONIA in the last 168 hours. Coagulation profile No results for input(s): INR, PROTIME in the last 168 hours.  CBC: Recent Labs  Lab 03/12/20 0714 03/12/20 0714 03/13/20 0907 03/13/20 0907 03/14/20 0457 03/15/20 0550 03/16/20 0215 03/17/20 0505 03/18/20 0514  WBC 5.5   < > 6.8   < > 7.9 7.2 8.0 8.5 7.4  NEUTROABS 4.1  --  5.0  --  5.8 5.3  --   --   --   HGB 10.0*   < > 10.6*   < > 10.6* 10.9* 9.7* 10.8* 9.0*  HCT 34.7*   < > 35.8*   < > 34.0* 34.9* 31.9* 36.6 30.3*  MCV 84.4   < > 82.3   < > 80.0 80.2 81.8 84.7 84.4  PLT 251   < > 268   < > 283 298 254 243 244   < > = values in this interval not displayed.   Cardiac Enzymes: No results for input(s): CKTOTAL, CKMB, CKMBINDEX, TROPONINI in the last 168 hours. BNP (last 3 results) No results for input(s): PROBNP in the last 8760 hours. CBG: Recent Labs  Lab 03/17/20 2128 03/18/20 0002 03/18/20 0456 03/18/20 0735 03/18/20 1213  GLUCAP 81 79 82 83 80   D-Dimer: No results for input(s): DDIMER in the last 72 hours. Hgb A1c: No results for input(s): HGBA1C in the last 72 hours. Lipid Profile: No results for input(s):  CHOL, HDL, LDLCALC, TRIG, CHOLHDL, LDLDIRECT in the last 72 hours. Thyroid function studies: No results for input(s): TSH, T4TOTAL, T3FREE, THYROIDAB in the last 72 hours.  Invalid input(s): FREET3 Anemia work up: No results for input(s): VITAMINB12, FOLATE, FERRITIN, TIBC, IRON, RETICCTPCT in the last 72 hours. Sepsis Labs: Recent Labs  Lab 03/15/20 0550 03/16/20 0215 03/17/20 0505 03/18/20 0514  WBC 7.2 8.0 8.5 7.4    Microbiology Recent Results (from the past 240 hour(s))  Body fluid culture (includes gram stain)     Status: None   Collection Time: 03/09/20  6:52 PM   Specimen: Pleural Fluid  Result Value Ref Range Status   Specimen Description   Final    PLEURAL Performed at [facility], [address] [place], [place] [address]    Special Requests   Final    PLEURAL Performed at [facility], [address] [place], [place] [address]    Gram Stain   Final    FEW WBC PRESENT, PREDOMINANTLY MONONUCLEAR NO ORGANISMS SEEN Performed at [facility], [place] [address], [place], [place] [address]    Culture NO GROWTH  Final   Report Status 03/13/2020 FINAL  Final  Acid Fast Smear (AFB)  Status: None   Collection Time: 03/09/20  6:52 PM  Result Value Ref Range Status   AFB Specimen Processing Concentration  Final   Acid Fast Smear Negative  Final    Comment: (NOTE) Performed At: [facility] [place], [place] [id number] Rush Farmer MD [id number]    Source (AFB) PLEURAL  Final  Respiratory Panel by RT PCR (Flu A&B, Covid) - Nasopharyngeal Swab     Status: None   Collection Time: 03/09/20  8:37 PM   Specimen: Nasopharyngeal Swab  Result Value Ref Range Status   SARS Coronavirus 2 by RT PCR NEGATIVE NEGATIVE Final    Comment: (NOTE) SARS-CoV-2 target nucleic acids are NOT DETECTED. The SARS-CoV-2 RNA is generally detectable in upper respiratoy specimens during the acute phase of infection. The  lowest concentration of SARS-CoV-2 viral copies this assay can detect is 131 copies/mL. A negative result does not preclude SARS-Cov-2 infection and should not be used as the sole basis for treatment or other patient management decisions. A negative result may occur with  improper specimen collection/handling, submission of specimen other than nasopharyngeal swab, presence of viral mutation(s) within the areas targeted by this assay, and inadequate number of viral copies (<131 copies/mL). A negative result must be combined with clinical observations, patient history, and epidemiological information. The expected result is Negative. Fact Sheet for Patients:  PinkCheek.be Fact Sheet for Healthcare Providers:  GravelBags.it This test is not yet ap proved or cleared by the [place] FDA and  has been authorized for detection and/or diagnosis of SARS-CoV-2 by FDA under an Emergency Use Authorization (EUA). This EUA will remain  in effect (meaning this test can be used) for the duration of the COVID-19 declaration under Section 564(b)(1) of the Act, 21 U.S.C. section 360bbb-3(b)(1), unless the authorization is terminated or revoked sooner.    Influenza A by PCR NEGATIVE NEGATIVE Final   Influenza B by PCR NEGATIVE NEGATIVE Final    Comment: (NOTE) The Xpert Xpress SARS-CoV-2/FLU/RSV assay is intended as an aid in  the diagnosis of influenza from Nasopharyngeal swab specimens and  should not be used as a sole basis for treatment. Nasal washings and  aspirates are unacceptable for Xpert Xpress SARS-CoV-2/FLU/RSV  testing. Fact Sheet for Patients: PinkCheek.be Fact Sheet for Healthcare Providers: GravelBags.it This test is not yet approved or cleared by the [place] FDA and  has been authorized for detection and/or diagnosis of SARS-CoV-2 by  FDA under an Emergency  Use Authorization (EUA). This EUA will remain  in effect (meaning this test can be used) for the duration of the  Covid-19 declaration under Section 564(b)(1) of the Act, 21  U.S.C. section 360bbb-3(b)(1), unless the authorization is  terminated or revoked. Performed at [facility], [address] [place], [place] [address]   Culture, blood (routine x 2)     Status: None   Collection Time: 03/09/20  9:31 PM   Specimen: BLOOD  Result Value Ref Range Status   Specimen Description BLOOD LEFT ASSIST CONTROL  Final   Special Requests   Final    BOTTLES DRAWN AEROBIC AND ANAEROBIC Blood Culture adequate volume   Culture   Final    NO GROWTH 5 DAYS Performed at [facility], [address], [place], [place] [address]    Report Status 03/14/2020 FINAL  Final  Culture, blood (routine x 2)     Status: None   Collection Time: 03/09/20  9:45 PM   Specimen: BLOOD  Result  Value Ref Range Status   Specimen Description BLOOD RIGHT ASSIST CONTROL  Final   Special Requests   Final    BOTTLES DRAWN AEROBIC AND ANAEROBIC Blood Culture adequate volume   Culture   Final    NO GROWTH 5 DAYS Performed at [facility], [address] [place], [place] [address]    Report Status 03/14/2020 FINAL  Final  MRSA PCR Screening     Status: None   Collection Time: 03/10/20  3:58 PM   Specimen: Nasopharyngeal  Result Value Ref Range Status   MRSA by PCR NEGATIVE NEGATIVE Final    Comment:        The GeneXpert MRSA Assay (FDA approved for NASAL specimens only), is one component of a comprehensive MRSA colonization surveillance program. It is not intended to diagnose MRSA infection nor to guide or monitor treatment for MRSA infections. Performed at [facility], [address], [place], [place] [address]     Procedures and diagnostic studies:  DG Chest [facility] 1 View  Result Date: 03/18/2020 CLINICAL DATA:  Pleural effusion RIGHT side. Previous thoracentesis.  PICC line with antibiotic treatment. Productive cough. EXAM: PORTABLE CHEST 1 VIEW COMPARISON:  03/15/2020 FINDINGS: Patient has RIGHT-sided PICC line, tip overlying the level of the superior vena cava. Heart size is enlarged and stable in configuration. Persistent RIGHT pleural effusion. Bibasilar atelectasis. IMPRESSION: Stable appearance of cardiomegaly and RIGHT pleural effusion and bilateral atelectasis. Electronically Signed   By: Nolon Nations M.D.   On: 03/18/2020 16:25    Medications:   . apixaban  5 mg Oral BID  . busPIRone  10 mg Oral TID  . Chlorhexidine Gluconate Cloth  6 each Topical Daily  . diltiazem  120 mg Oral Daily  . feeding supplement (NEPRO CARB STEADY)  237 mL Oral BID BM  . guaiFENesin  600 mg Oral BID  . lipase/protease/amylase  36,000 Units Oral TID WC  . magnesium oxide  200 mg Oral BID  . pantoprazole  40 mg Oral Daily  . sertraline  50 mg Oral Daily  . sodium chloride flush  10-40 mL Intracatheter Q12H  . traZODone  50 mg Oral QHS   Continuous Infusions: . sodium chloride 10 mL/hr at 03/18/20 0507  .  ceFAZolin (ANCEF) IV 2 g (03/18/20 1423)  . dextrose 5 % and 0.45% NaCl 30 mL/hr at 03/17/20 1345     LOS: 9 days   Jashae Wiggs  [facility]     03/18/2020, 4:30 PM

## 2020-03-18 NOTE — Progress Notes (Signed)
Occupational Therapy Treatment Patient Details Name: Brandy Armstrong MRN: [id number] DOB: 1946-09-08 Today's Date: 03/18/2020    History of present illness 74 y.o. female, with history of A. fib on anticoagulation, chronic pancreatic insufficiency, chronic back pain COPD, rheumatoid arthritis here with pleural effusion with possible chest tube placement. Recent admission.   OT comments  Pt in bed busy eating lunch upon arrival - answering appropriate questions - but slow response time. She is watching   Basketball - that she says she likes - appear to be distracting while eating -  But also state she is not hungry - was able to use utencils and pick up full glass with R hand. Pt show decrease UE strength and decrease bilateral shoulder AROM - with R worse than L -but has RA per pt - pt can benefit from OT services to increase strength , sitting and standing balance and functional mobility  In ADL's    Follow Up Recommendations    SNF   Equipment Recommendations       Recommendations for Other Services      Precautions / Restrictions Precautions Precautions: Fall Restrictions Weight Bearing Restrictions: No       Mobility Bed Mobility Overal bed mobility: (Patient decliines due to being too uncomfortable) burning pain  With BM and urination and sore                 Transfers                      Balance                                           ADL either performed or assessed with clinical judgement   ADL  grooming - combing hair using R and L hand   With setup                                              Vision       Perception     Praxis      Cognition Arousal/Alertness: Awake/alert Behavior During Therapy: Flat affect Overall Cognitive Status: Within Functional Limits for tasks assessed Area of Impairment: Attention                               General Comments: tv on basketball , and not  paying attention to eating - but also said not hungry - correct on all orientation questions        Exercises Exercises: General Lower Extremity Total Joint Exercises Ankle Circles/Pumps: AAROM;Strengthening;Right;Left;10 reps Quad Sets: Strengthening;Right;Left;Both;10 reps Gluteal Sets: Strengthening;Both;10 reps Towel Squeeze: AROM;Strengthening;Both;10 reps Long Arc Quad: AROM;Strengthening;Both;10 reps Marching in Standing: AROM;Strengthening;Both;5 reps Other Exercises Other Exercises: Pt was able to comb her hair on both sides - with only setup , able to feed self using utencils and pick up glass with R hand - but distracting -  watching basketball - she says she likes to watch and not hungry Other Exercises: AROM and isometric strengtening done to bilateral UE with OT - decrease R shoulder flexion to 90 - pt report she as RA - AROM WNL elbow to hands - but decrease grip and decrease strength  in R > L - 8 reps done for elbow , wrist  in all planes - except L elbow flexion -had IV   Shoulder Instructions       General Comments      Pertinent Vitals/ Pain       Pain Assessment: (no number provided -pt says if she use bathroom it burns and sore/ raw down there) Faces Pain Scale: Hurts even more Pain Location: (bottom) Pain Descriptors / Indicators: Burning  Home Living Family/patient expects to be discharged to:: Skilled nursing facility                                        Prior Functioning/Environment              Frequency           Progress Toward Goals  OT Goals(current goals can now be found in the care plan section)     Acute Rehab OT Goals Patient Stated Goal: non stated OT Goal Formulation: With patient Time For Goal Achievement: 03/29/20 Potential to Achieve Goals: Fair  Plan      Co-evaluation                 AM-PAC OT "6 Clicks" Daily Activity     Outcome Measure                    End of Session   PROGRESSING TOWARDS GOALS  OT Visit Diagnosis: Muscle weakness (generalized) (M62.81)   Activity Tolerance Patient limited by pain(did not want to get up)   Patient Left in bed;with call bell/phone within reach;with bed alarm set;with nursing/sitter in room   Nurse Communication          Time: [id number] OT Time Calculation (min): 22 min  Charges: OT General Charges $OT Visit: 1 Visit OT Treatments $Self Care/Home Management : 8-22 mins $Therapeutic Exercise: 8-22 mins     Benjamyn Hestand OTR/L, CLT 03/18/2020, 12:49 PM

## 2020-03-19 LAB — GLUCOSE, CAPILLARY
Glucose-Capillary: 84 mg/dL (ref 70–99)
Glucose-Capillary: 80 mg/dL (ref 70–99)
Glucose-Capillary: 89 mg/dL (ref 70–99)
Glucose-Capillary: 97 mg/dL (ref 70–99)
Glucose-Capillary: 75 mg/dL (ref 70–99)
Glucose-Capillary: 98 mg/dL (ref 70–99)
Glucose-Capillary: 74 mg/dL (ref 70–99)

## 2020-03-19 LAB — CBC
HCT: 31.4 % — ABNORMAL LOW (ref 36.0–46.0)
Hemoglobin: 9.2 g/dL — ABNORMAL LOW (ref 12.0–15.0)
MCH: 24.7 pg — ABNORMAL LOW (ref 26.0–34.0)
MCHC: 29.3 g/dL — ABNORMAL LOW (ref 30.0–36.0)
MCV: 84.4 fL (ref 80.0–100.0)
Platelets: 239 10*3/uL (ref 150–400)
RBC: 3.72 MIL/uL — ABNORMAL LOW (ref 3.87–5.11)
RDW: 21.1 % — ABNORMAL HIGH (ref 11.5–15.5)
WBC: 6.7 10*3/uL (ref 4.0–10.5)
nRBC: 0 % (ref 0.0–0.2)

## 2020-03-19 LAB — BASIC METABOLIC PANEL
Anion gap: 7 (ref 5–15)
BUN: 13 mg/dL (ref 8–23)
CO2: 26 mmol/L (ref 22–32)
Calcium: 8.4 mg/dL — ABNORMAL LOW (ref 8.9–10.3)
Chloride: 101 mmol/L (ref 98–111)
Creatinine, Ser: 0.55 mg/dL (ref 0.44–1.00)
GFR calc Af Amer: >60 mL/min (ref >60)
GFR calc non Af Amer: >60 mL/min (ref >60)
Glucose, Bld: 88 mg/dL (ref 70–99)
Potassium: 3.4 mmol/L — ABNORMAL LOW (ref 3.5–5.1)
Sodium: 134 mmol/L — ABNORMAL LOW (ref 135–145)

## 2020-03-19 LAB — MAGNESIUM: Magnesium: 1.8 mg/dL (ref 1.7–2.4)

## 2020-03-19 MED ORDER — DILTIAZEM HCL ER COATED BEADS 120 MG PO CP24
120.0000 mg | ORAL_CAPSULE | Freq: Once | ORAL | Status: AC
  Administered 2020-03-19: 120 mg via ORAL
  Filled 2020-03-19: qty 1

## 2020-03-19 MED ORDER — POTASSIUM CHLORIDE 20 MEQ PO PACK
40.0000 meq | PACK | Freq: Once | ORAL | Status: AC
  Administered 2020-03-19: 40 meq via ORAL
  Filled 2020-03-19: qty 2

## 2020-03-19 MED ORDER — DILTIAZEM HCL ER COATED BEADS 120 MG PO CP24
240.0000 mg | ORAL_CAPSULE | Freq: Every day | ORAL | Status: DC
  Administered 2020-03-20: 240 mg via ORAL
  Filled 2020-03-19: qty 2

## 2020-03-19 NOTE — Plan of Care (Signed)
  Problem: Clinical Measurements: Goal: Will remain free from infection Outcome: Progressing   Problem: Safety: Goal: Ability to remain free from injury will improve Outcome: Progressing   

## 2020-03-19 NOTE — Progress Notes (Signed)
Progress Note    Brandy Armstrong  [id number] DOB: 02-20-1946  DOA: 03/09/2020 PCP: [name], FNP      Brief Narrative:    Medical records reviewed and are as summarized below:  Brandy Armstrong is an 74 y.o. female with a known history of multiple medical problems that are mentioned below including COPD, atrial fibrillation, on Eliquis, peripheral neuropathy and rheumatoid arthritis, who was recently admitted for MSSA bacteremia of unclear etiology for which she continues to take IV antibiotic therapy through PICC line at [facility] facility where she resides.  She presents to the emergency room with acute onset of worsening dyspnea with hypoxemia to 85% on room air with associated cough occasionally productive of yellowish sputum without fever or chills. The patient underwent ultrasound-guided right thoracentesis in the ER with drainage of 1 L of pleural fluid.  She was treated with IV antibiotics.  She completed 8 days of IV cefepime.  She was previously on IV Ancef for MSSA bacteremia and this has been resumed.  She will continue IV Ancef through 03/26/2020.  She also developed atrial fibrillation with rapid ventricular response and required IV Cardizem drip for rate control.  She was previously on Eliquis but she was refusing to take medication so she was treated with IV heparin infusion.  Her condition has slowly improved and IV Cardizem has been switched to oral Cardizem and IV heparin infusion has been switched to Eliquis.    Assessment/Plan:   Principal Problem:   Pleural effusion Active Problems:   Pneumonia  Exudative Right Pleural Effusion  Acute Hypoxic Respiratory Failure:  Currently requiring 2 L Marvin S/p thoracentesis in ED with 1 L serosanguinous fluid removed on 3/11 3/12 with thoracentesis by IR with 1.2 L bloody pleural fluid -> c/b R basilar ex vacuo hydropneumothorax Repeat CXR 3/12 with interval reduction of R sided pleural effusion  -> tiny R basilar ex vacuo hydropneumothorax.  Improved aeration of the R mid and lower lung with persistent R medial basilar heterogeneous/consolidative opacities (atelectasis vs infiltrate) CXR 3/13 with mild interval worsening in lung aeration on the R consistent with increase in R pleural fluid and atelectasis CXR 3/14 with moderate layering R pleural effusion with R lower lobe atelectasis CXR 3/15 with small R effusion and R basilar atelectasis    Repeat chest x-ray on 03/18/2020 showed stable appearance of cardiomegaly and right pleural effusion and bilateral atelectasis. Exudative by lights No malignancy on pleural fluid cytology No growth from pleural fluid culture quant gold (negative), AFB smear from pleural fluid is negative (AFB cx is pending).  Hold plaquenil and adalimumab     Delirium:  Improving.  She is close to her baseline. TSH (wnl), B12 (wnl), folate (wnl), VBG (without hypercarbia) and head CT (without acute abnormality) She has poor PO intake and is not taking meds   Recent MSSA bacteremia. Continue IV Ancef  through 03/26/2020 as previously scheduled.   COPD. Continue bronchodilators   Rheumatoid arthritis. -We willholdPlaquenilgiven prolonged QT interval. - Repeat EKG -> improved qtc   Hypertension. -We will continue HCTZ and Cardizem CD.   Atrial fibrillation with RVR: Heart rate was elevated this morning. Increase oral Cardizem from 122 to 40 mg daily. Continue oral Cardizem and Eliquis   Anxiety. -We will continue BuSpar.   DVT prophylaxis. Restart Eliquis  # Hypokalemia:  Replete potassium  Patient is being followed by the hospice team.  Hospice team will meet with family on Monday,  03/20/2020    Body mass index is 29.05 kg/m.   Family Communication/Anticipated D/C date and plan/Code Status   DVT prophylaxis: Eliquis Code Status: Use BiPAP but no CPR. DNI Family Communication: Plan discussed with her family (Ginger and  [name]) over the phone.   Disposition Plan: Patient is from SNF.  Patient is medically stable for discharge.  Plan to discharge to SNF tomorrow pending insurance authorization.     Subjective:   She has no complaints.  She feels much better today.  Objective:    Vitals:   03/18/20 2036 03/19/20 0355 03/19/20 1208 03/19/20 1518  BP:  139/76 123/84 116/70  Pulse:  (!) 102 (!) 107 88  Resp: 20 18  16   Temp:  98.1 F (36.7 C) 97.8 F (36.6 C) 97.6 F (36.4 C)  TempSrc:  Oral    SpO2:  99% 99% 97%  Weight:  81.6 kg    Height:        Intake/Output Summary (Last 24 hours) at 03/19/2020 1535 Last data filed at 03/18/2020 2141 Gross per 24 hour  Intake 100 ml  Output --  Net 100 ml   Filed Weights   03/17/20 0401 03/18/20 0513 03/19/20 0355  Weight: 82.5 kg 82.1 kg 81.6 kg    Exam:  GEN: No acute distress, she looks comfortable in bed SKIN: Warm and dry EYES: EOMI ENT: MMM.    CV: Irregular rate and rhythm, mildly tachycardic PULM: No wheezing or rales heard ABD: soft, ND, NT, +BS CNS: Alert and oriented to person, place and time. EXT: No edema or tenderness   Data Reviewed:   I have personally reviewed following labs and imaging studies:  Labs: Labs show the following:   Basic Metabolic Panel: Recent Labs  Lab 03/13/20 0907 03/13/20 0907 03/13/20 1113 03/13/20 1113 03/14/20 0455 03/14/20 0455 03/15/20 0550 03/15/20 0550 03/16/20 0215 03/19/20 0406  NA 129*   < > 133*  --  136  --  135  --  134* 134*  K 3.0*   < > 2.9*   < > 3.4*   < > 3.3*   < > 4.1 3.4*  CL 79*   < > 80*  --  82*  --  90*  --  94* 101  CO2 36*   < > 41*  --  38*  --  34*  --  29 26  GLUCOSE 411*   < > 162*  --  106*  --  105*  --  94 88  BUN 16   < > 17  --  22  --  23  --  22 13  CREATININE 0.62   < > 0.60  --  0.70  --  0.56  --  0.65 0.55  CALCIUM 8.5*   < > 8.6*  --  8.8*  --  8.6*  --  8.2* 8.4*  MG 1.8  --   --   --  1.8  --  1.7  --  2.2 1.8  PHOS 2.2*  --   --   --  2.9   --  2.6  --  2.3*  --    < > = values in this interval not displayed.   GFR Estimated Creatinine Clearance: 69.4 mL/min (by C-G formula based on SCr of 0.55 mg/dL). Liver Function Tests: Recent Labs  Lab 03/13/20 0907 03/14/20 0455 03/15/20 0550  AST 19 19 17   ALT 6 6 5   ALKPHOS 96 94 88  BILITOT 1.5*  1.5* 1.4*  PROT 7.2 6.8 6.6  ALBUMIN 2.6* 2.4* 2.4*   No results for input(s): LIPASE, AMYLASE in the last 168 hours. No results for input(s): AMMONIA in the last 168 hours. Coagulation profile No results for input(s): INR, PROTIME in the last 168 hours.  CBC: Recent Labs  Lab 03/13/20 0907 03/13/20 0907 03/14/20 0457 03/14/20 0457 03/15/20 0550 03/16/20 0215 03/17/20 0505 03/18/20 0514 03/19/20 0406  WBC 6.8   < > 7.9   < > 7.2 8.0 8.5 7.4 6.7  NEUTROABS 5.0  --  5.8  --  5.3  --   --   --   --   HGB 10.6*   < > 10.6*   < > 10.9* 9.7* 10.8* 9.0* 9.2*  HCT 35.8*   < > 34.0*   < > 34.9* 31.9* 36.6 30.3* 31.4*  MCV 82.3   < > 80.0   < > 80.2 81.8 84.7 84.4 84.4  PLT 268   < > 283   < > 298 254 243 244 239   < > = values in this interval not displayed.   Cardiac Enzymes: No results for input(s): CKTOTAL, CKMB, CKMBINDEX, TROPONINI in the last 168 hours. BNP (last 3 results) No results for input(s): PROBNP in the last 8760 hours. CBG: Recent Labs  Lab 03/18/20 2055 03/19/20 0151 03/19/20 0356 03/19/20 0806 03/19/20 1144  GLUCAP 70 84 80 89 97   D-Dimer: No results for input(s): DDIMER in the last 72 hours. Hgb A1c: No results for input(s): HGBA1C in the last 72 hours. Lipid Profile: No results for input(s): CHOL, HDL, LDLCALC, TRIG, CHOLHDL, LDLDIRECT in the last 72 hours. Thyroid function studies: No results for input(s): TSH, T4TOTAL, T3FREE, THYROIDAB in the last 72 hours.  Invalid input(s): FREET3 Anemia work up: No results for input(s): VITAMINB12, FOLATE, FERRITIN, TIBC, IRON, RETICCTPCT in the last 72 hours. Sepsis Labs: Recent Labs  Lab  03/16/20 0215 03/17/20 0505 03/18/20 0514 03/19/20 0406  WBC 8.0 8.5 7.4 6.7    Microbiology Recent Results (from the past 240 hour(s))  Body fluid culture (includes gram stain)     Status: None   Collection Time: 03/09/20  6:52 PM   Specimen: Pleural Fluid  Result Value Ref Range Status   Specimen Description   Final    PLEURAL Performed at [facility], [address], [place], [place] [address]    Special Requests   Final    PLEURAL Performed at [facility], [address] [place], [place] [address]    Gram Stain   Final    FEW WBC PRESENT, PREDOMINANTLY MONONUCLEAR NO ORGANISMS SEEN Performed at [facility], [place] [address], [place], [place] [address]    Culture NO GROWTH  Final   Report Status 03/13/2020 FINAL  Final  Acid Fast Smear (AFB)     Status: None   Collection Time: 03/09/20  6:52 PM  Result Value Ref Range Status   AFB Specimen Processing Concentration  Final   Acid Fast Smear Negative  Final    Comment: (NOTE) Performed At: [facility] [place], [place] [id number] Rush Farmer MD [id number]    Source (AFB) PLEURAL  Final  Respiratory Panel by RT PCR (Flu A&B, Covid) - Nasopharyngeal Swab     Status: None   Collection Time: 03/09/20  8:37 PM   Specimen: Nasopharyngeal Swab  Result Value Ref Range Status   SARS Coronavirus 2 by RT PCR NEGATIVE NEGATIVE Final  Comment: (NOTE) SARS-CoV-2 target nucleic acids are NOT DETECTED. The SARS-CoV-2 RNA is generally detectable in upper respiratoy specimens during the acute phase of infection. The lowest concentration of SARS-CoV-2 viral copies this assay can detect is 131 copies/mL. A negative result does not preclude SARS-Cov-2 infection and should not be used as the sole basis for treatment or other patient management decisions. A negative result may occur with  improper specimen collection/handling, submission of specimen other than nasopharyngeal  swab, presence of viral mutation(s) within the areas targeted by this assay, and inadequate number of viral copies (<131 copies/mL). A negative result must be combined with clinical observations, patient history, and epidemiological information. The expected result is Negative. Fact Sheet for Patients:  PinkCheek.be Fact Sheet for Healthcare Providers:  GravelBags.it This test is not yet ap proved or cleared by the [place] FDA and  has been authorized for detection and/or diagnosis of SARS-CoV-2 by FDA under an Emergency Use Authorization (EUA). This EUA will remain  in effect (meaning this test can be used) for the duration of the COVID-19 declaration under Section 564(b)(1) of the Act, 21 U.S.C. section 360bbb-3(b)(1), unless the authorization is terminated or revoked sooner.    Influenza A by PCR NEGATIVE NEGATIVE Final   Influenza B by PCR NEGATIVE NEGATIVE Final    Comment: (NOTE) The Xpert Xpress SARS-CoV-2/FLU/RSV assay is intended as an aid in  the diagnosis of influenza from Nasopharyngeal swab specimens and  should not be used as a sole basis for treatment. Nasal washings and  aspirates are unacceptable for Xpert Xpress SARS-CoV-2/FLU/RSV  testing. Fact Sheet for Patients: PinkCheek.be Fact Sheet for Healthcare Providers: GravelBags.it This test is not yet approved or cleared by the [place] FDA and  has been authorized for detection and/or diagnosis of SARS-CoV-2 by  FDA under an Emergency Use Authorization (EUA). This EUA will remain  in effect (meaning this test can be used) for the duration of the  Covid-19 declaration under Section 564(b)(1) of the Act, 21  U.S.C. section 360bbb-3(b)(1), unless the authorization is  terminated or revoked. Performed at [facility], [address] [place], [place] [address]   Culture, blood  (routine x 2)     Status: None   Collection Time: 03/09/20  9:31 PM   Specimen: BLOOD  Result Value Ref Range Status   Specimen Description BLOOD LEFT ASSIST CONTROL  Final   Special Requests   Final    BOTTLES DRAWN AEROBIC AND ANAEROBIC Blood Culture adequate volume   Culture   Final    NO GROWTH 5 DAYS Performed at [facility], [address], [place], [place] [address]    Report Status 03/14/2020 FINAL  Final  Culture, blood (routine x 2)     Status: None   Collection Time: 03/09/20  9:45 PM   Specimen: BLOOD  Result Value Ref Range Status   Specimen Description BLOOD RIGHT ASSIST CONTROL  Final   Special Requests   Final    BOTTLES DRAWN AEROBIC AND ANAEROBIC Blood Culture adequate volume   Culture   Final    NO GROWTH 5 DAYS Performed at [facility], [address], [place], [place] [address]    Report Status 03/14/2020 FINAL  Final  MRSA PCR Screening     Status: None   Collection Time: 03/10/20  3:58 PM   Specimen: Nasopharyngeal  Result Value Ref Range Status   MRSA by PCR NEGATIVE NEGATIVE Final    Comment:  The GeneXpert MRSA Assay (FDA approved for NASAL specimens only), is one component of a comprehensive MRSA colonization surveillance program. It is not intended to diagnose MRSA infection nor to guide or monitor treatment for MRSA infections. Performed at [facility], [address], [place], [place] [address]     Procedures and diagnostic studies:  DG Chest [facility] 1 View  Result Date: 03/18/2020 CLINICAL DATA:  Pleural effusion RIGHT side. Previous thoracentesis. PICC line with antibiotic treatment. Productive cough. EXAM: PORTABLE CHEST 1 VIEW COMPARISON:  03/15/2020 FINDINGS: Patient has RIGHT-sided PICC line, tip overlying the level of the superior vena cava. Heart size is enlarged and stable in configuration. Persistent RIGHT pleural effusion. Bibasilar atelectasis. IMPRESSION: Stable appearance of cardiomegaly and  RIGHT pleural effusion and bilateral atelectasis. Electronically Signed   By: Nolon Nations M.D.   On: 03/18/2020 16:25    Medications:   . apixaban  5 mg Oral BID  . busPIRone  10 mg Oral TID  . Chlorhexidine Gluconate Cloth  6 each Topical Daily  . [START ON 03/20/2020] diltiazem  240 mg Oral Daily  . feeding supplement (NEPRO CARB STEADY)  237 mL Oral BID BM  . guaiFENesin  600 mg Oral BID  . lipase/protease/amylase  36,000 Units Oral TID WC  . magnesium oxide  200 mg Oral BID  . pantoprazole  40 mg Oral Daily  . sertraline  50 mg Oral Daily  . sodium chloride flush  10-40 mL Intracatheter Q12H  . traZODone  50 mg Oral QHS   Continuous Infusions: . sodium chloride 250 mL (03/19/20 0555)  .  ceFAZolin (ANCEF) IV 2 g (03/19/20 1320)  . dextrose 5 % and 0.45% NaCl 30 mL/hr at 03/17/20 1345     LOS: 10 days   Dyami Umbach  [facility]     03/19/2020, 3:35 PM

## 2020-03-20 LAB — BASIC METABOLIC PANEL
Anion gap: 9 (ref 5–15)
BUN: 13 mg/dL (ref 8–23)
CO2: 25 mmol/L (ref 22–32)
Calcium: 8.5 mg/dL — ABNORMAL LOW (ref 8.9–10.3)
Chloride: 101 mmol/L (ref 98–111)
Creatinine, Ser: 0.63 mg/dL (ref 0.44–1.00)
GFR calc Af Amer: >60 mL/min (ref >60)
GFR calc non Af Amer: >60 mL/min (ref >60)
Glucose, Bld: 115 mg/dL — ABNORMAL HIGH (ref 70–99)
Potassium: 3.2 mmol/L — ABNORMAL LOW (ref 3.5–5.1)
Sodium: 135 mmol/L (ref 135–145)

## 2020-03-20 LAB — GLUCOSE, CAPILLARY
Glucose-Capillary: 79 mg/dL (ref 70–99)
Glucose-Capillary: 80 mg/dL (ref 70–99)
Glucose-Capillary: 100 mg/dL — ABNORMAL HIGH (ref 70–99)
Glucose-Capillary: 75 mg/dL (ref 70–99)
Glucose-Capillary: 83 mg/dL (ref 70–99)

## 2020-03-20 LAB — RESPIRATORY PANEL BY RT PCR (FLU A&B, COVID)
Influenza A by PCR: NEGATIVE
Influenza B by PCR: NEGATIVE
SARS Coronavirus 2 by RT PCR: NEGATIVE

## 2020-03-20 LAB — MAGNESIUM: Magnesium: 1.8 mg/dL (ref 1.7–2.4)

## 2020-03-20 MED ORDER — POLYVINYL ALCOHOL 1.4 % OP SOLN
1.0000 [drp] | Freq: Four times a day (QID) | OPHTHALMIC | Status: DC | PRN
  Filled 2020-03-20: qty 15

## 2020-03-20 MED ORDER — METOPROLOL SUCCINATE ER 25 MG PO TB24
12.5000 mg | ORAL_TABLET | Freq: Every day | ORAL | Status: DC
  Administered 2020-03-20: 12:00:00 12.5 mg via ORAL
  Filled 2020-03-20: qty 1

## 2020-03-20 MED ORDER — ACETAMINOPHEN 650 MG RE SUPP: 650.0000 mg | Freq: Four times a day (QID) | RECTAL | Status: DC | PRN

## 2020-03-20 MED ORDER — SODIUM CHLORIDE 0.9 % IV SOLN: 250.0000 mL | INTRAVENOUS | Status: DC | PRN

## 2020-03-20 MED ORDER — MORPHINE SULFATE (PF) 2 MG/ML IV SOLN: 1.0000 mg | INTRAVENOUS | Status: DC | PRN

## 2020-03-20 MED ORDER — ONDANSETRON HCL 4 MG/2ML IJ SOLN: 4.0000 mg | Freq: Four times a day (QID) | INTRAMUSCULAR | Status: DC | PRN

## 2020-03-20 MED ORDER — HALOPERIDOL 0.5 MG PO TABS
0.5000 mg | ORAL_TABLET | ORAL | Status: DC | PRN
  Filled 2020-03-20: qty 1

## 2020-03-20 MED ORDER — POTASSIUM CHLORIDE ER 10 MEQ PO TBCR: 10.0000 meq | EXTENDED_RELEASE_TABLET | Freq: Every day | ORAL | Status: DC

## 2020-03-20 MED ORDER — GLYCOPYRROLATE 0.2 MG/ML IJ SOLN
0.2000 mg | INTRAMUSCULAR | Status: DC | PRN
  Filled 2020-03-20: qty 1

## 2020-03-20 MED ORDER — HALOPERIDOL LACTATE 5 MG/ML IJ SOLN: 0.5000 mg | INTRAMUSCULAR | Status: DC | PRN

## 2020-03-20 MED ORDER — HYDROXYZINE HCL 50 MG PO TABS: 50.0000 mg | ORAL_TABLET | Freq: Every day | ORAL | Status: DC | PRN

## 2020-03-20 MED ORDER — SODIUM CHLORIDE 0.9% FLUSH: 3.0000 mL | Freq: Two times a day (BID) | INTRAVENOUS | Status: DC

## 2020-03-20 MED ORDER — ACETAMINOPHEN 325 MG PO TABS: 650.0000 mg | ORAL_TABLET | Freq: Four times a day (QID) | ORAL | Status: DC | PRN

## 2020-03-20 MED ORDER — BIOTENE DRY MOUTH MT LIQD: 15.0000 mL | OROMUCOSAL | Status: DC | PRN

## 2020-03-20 MED ORDER — LORAZEPAM 2 MG/ML IJ SOLN: 0.5000 mg | INTRAMUSCULAR | Status: DC | PRN

## 2020-03-20 MED ORDER — LORAZEPAM 1 MG PO TABS: 1.0000 mg | ORAL_TABLET | ORAL | Status: DC | PRN

## 2020-03-20 MED ORDER — DILTIAZEM HCL ER COATED BEADS 120 MG PO CP24: 240.0000 mg | ORAL_CAPSULE | Freq: Every day | ORAL | Status: DC

## 2020-03-20 MED ORDER — LORAZEPAM 2 MG/ML PO CONC
1.0000 mg | ORAL | Status: DC | PRN
  Filled 2020-03-20: qty 0.5

## 2020-03-20 MED ORDER — ONDANSETRON 4 MG PO TBDP
4.0000 mg | ORAL_TABLET | Freq: Four times a day (QID) | ORAL | Status: DC | PRN
  Filled 2020-03-20: qty 1

## 2020-03-20 MED ORDER — HYDROCHLOROTHIAZIDE 25 MG PO TABS: 12.5000 mg | ORAL_TABLET | Freq: Every day | ORAL | Status: DC

## 2020-03-20 MED ORDER — HALOPERIDOL LACTATE 2 MG/ML PO CONC
0.5000 mg | ORAL | Status: DC | PRN
  Filled 2020-03-20: qty 0.3

## 2020-03-20 MED ORDER — GLYCOPYRROLATE 1 MG PO TABS
1.0000 mg | ORAL_TABLET | ORAL | Status: DC | PRN
  Filled 2020-03-20: qty 1

## 2020-03-20 MED ORDER — SODIUM CHLORIDE 0.9% FLUSH: 3.0000 mL | INTRAVENOUS | Status: DC | PRN

## 2020-03-20 MED ORDER — POTASSIUM CHLORIDE CRYS ER 20 MEQ PO TBCR
40.0000 meq | EXTENDED_RELEASE_TABLET | Freq: Once | ORAL | Status: AC
  Administered 2020-03-20: 40 meq via ORAL
  Filled 2020-03-20: qty 2

## 2020-03-20 NOTE — Discharge Summary (Addendum)
Physician Discharge Summary  [name] [id number] DOB: 1946/07/08 DOA: 03/09/2020  PCP: [name], FNP  Admit date: 03/09/2020 Discharge date: 03/20/2020  Discharge disposition: Skilled nursing facility   Recommendations for Outpatient Follow-Up:   Follow-up with physician at the nursing home.   Discharge Diagnosis:   Principal Problem:   Pleural effusion Active Problems:   Pneumonia    Discharge Condition: Stable.  Diet recommendation: Dysphagia 1 diet, thin liquids (low-salt diet)  Code status: DO NOT INTUBATE and no CPR.    Hospital Course:   [name] is a 74 y.o. female with  multiple medicalproblemsincluding COPD, atrial fibrillation, on Eliquis, peripheral neuropathyandrheumatoid arthritis, who was recently admitted for MSSA bacteremia of unclear etiology for which she continues to take IV antibiotic therapy through PICC line at [facility] facility where she resides. She presented to the emergency room with acute onset of worsening dyspnea with hypoxemia to 85% on room airwith associated cough occasionally productive of yellowish sputum without fever or chills.The patient underwentultrasound-guidedright thoracentesis in the ER with drainage of 1 L of pleural fluid.  She was treated with IV antibiotics.  She completed 8 days of IV cefepime.  She was previously on IV Ancef for MSSA bacteremia and this has been resumed.  She will continue IV Ancef through 03/26/2020.  She was seen by the speech therapist who recommended dysphagia 1 diet for dysphagia.  She also developed atrial fibrillation with rapid ventricular response and required IV Cardizem drip for rate control.  She was previously on Eliquis but she was refusing to take medications so she was treated with IV heparin infusion.  Her condition has slowly improved and IV Cardizem has been switched to oral Cardizem and IV heparin infusion has been switched to  Eliquis.  She is deemed stable for discharge to SNF today.  Discharge plan was discussed with the patient.  Discharge plan was also discussed with patient's family ([name] and [name] (over the phone-both of them were on speaker phone together)       Discharge Exam:   Vitals:   03/20/20 0743 03/20/20 1128  BP: (!) 150/73 (!) 145/83  Pulse: (!) 102 100  Resp: 16 18  Temp: 98.2 F (36.8 C) 98.5 F (36.9 C)  SpO2: 99% 98%   Vitals:   03/20/20 0119 03/20/20 0210 03/20/20 0743 03/20/20 1128  BP:  123/86 (!) 150/73 (!) 145/83  Pulse:  (!) 113 (!) 102 100  Resp:  20 16 18   Temp:  98.5 F (36.9 C) 98.2 F (36.8 C) 98.5 F (36.9 C)  TempSrc:  Oral Axillary Oral  SpO2:  97% 99% 98%  Weight: 81.6 kg     Height:         GEN: NAD SKIN: No rash EYES: EOMI ENT: MMM, no oral ulcers or lesions seen CV: irregular rate and rhythm PULM: CTA B ABD: soft, ND, NT, +BS CNS: AAO x 3, non focal EXT: No edema or tenderness   The results of significant diagnostics from this hospitalization (including imaging, microbiology, ancillary and laboratory) are listed below for reference.     Procedures and Diagnostic Studies:   CT Chest Wo Contrast  Result Date: 03/09/2020 CLINICAL DATA:  Pleural effusion EXAM: CT CHEST WITHOUT CONTRAST TECHNIQUE: Multidetector CT imaging of the chest was performed following the standard protocol without IV contrast. COMPARISON:  Chest x-ray 03/09/2020, CT 03/11/2014 FINDINGS: Cardiovascular: Limited evaluation without intravenous contrast. Moderate aortic atherosclerosis without  aneurysm. Coronary vascular calcification. Mild cardiomegaly. No significant pericardial effusion. Mediastinum/Nodes: Midline trachea. No thyroid mass. Esophagus within normal limits. Borderline enlarged low right paratracheal lymph node measuring 1 cm, series 2, image number 50. Lungs/Pleura: Moderate to large residual right pleural effusion. Emphysema. Consolidation  within the right middle lobe and right lower lobe with air bronchograms. The left lung shows no focal airspace disease. Upper Abdomen: No acute abnormality. Musculoskeletal: Degenerative changes. No acute or suspicious osseous abnormality. IMPRESSION: 1. Moderate to large residual right pleural effusion. Consolidation within the right middle lobe and right lower lobe which may be secondary to atelectasis or pneumonia. Underlying lung mass could be obscured and imaging follow-up to clearing is recommended. 2. Cardiomegaly Aortic Atherosclerosis (ICD10-I70.0) and Emphysema (ICD10-J43.9). Electronically Signed   By: Donavan Foil M.D.   On: 03/09/2020 22:50   DG Chest Port 1 View  Result Date: 03/10/2020 CLINICAL DATA:  Post right-sided thoracentesis EXAM: PORTABLE CHEST 1 VIEW COMPARISON:  03/09/2020; chest CT-03/09/2020 FINDINGS: Grossly unchanged enlarged cardiac silhouette and mediastinal contours with persistent partial obscuration the right heart border secondary to right medial basilar heterogeneous/consolidative opacities. Interval reduction/near resolution of right-sided pleural effusion post thoracentesis with improved aeration of the right mid and lower lung. Interval development of a tiny loculated right basilar ex vacuo hydropneumothorax. No mediastinal shift. The left hemithorax remains well aerated. No new focal airspace opacities. Mild pulmonary congestion without frank evidence of edema. Stable position of support apparatus. No acute osseous abnormalities. Post cholecystectomy. IMPRESSION: 1. Interval reduction/near resolution of right-sided pleural effusion post thoracentesis with development of tiny right basilar ex vacuo hydropneumothorax. 2. Improved aeration of the right mid and lower lung with persistent right medial basilar heterogeneous/consolidative opacities, atelectasis versus infiltrate (favored). Electronically Signed   By: Sandi Mariscal M.D.   On: 03/10/2020 14:12   DG Chest Portable  1 View  Result Date: 03/09/2020 CLINICAL DATA:  Status post thoracentesis. EXAM: PORTABLE CHEST 1 VIEW COMPARISON:  Radiograph earlier this day. FINDINGS: Large right pleural effusion with slight decrease in size from earlier today. Small portion of aerated lung at the right lung apex is no seen. Right upper extremity PICC remains in place. No left pleural effusion. Mild left peribronchial thickening. No visualized pneumothorax. IMPRESSION: 1. Decreased size of large right pleural effusion after thoracentesis today. Large volume of pleural fluid persists. No pneumothorax. 2. Right upper extremity PICC remains in place. Electronically Signed   By: Keith Rake M.D.   On: 03/09/2020 19:23   DG Chest Portable 1 View  Result Date: 03/09/2020 CLINICAL DATA:  Shortness of breath EXAM: PORTABLE CHEST 1 VIEW COMPARISON:  02/23/2020 FINDINGS: Right upper extremity catheter tip terminates at expected location of SVC. Interval complete opacification of right thorax. Left lung grossly clear. Obscured cardiomediastinal silhouette. IMPRESSION: Interval complete opacification of the right thorax which may be secondary to large pleural effusion or diffuse airspace disease, or combination of the 2. Central obstructing process is also possible. Electronically Signed   By: Donavan Foil M.D.   On: 03/09/2020 17:43   US THORACENTESIS ASP PLEURAL SPACE W/IMG GUIDE  Result Date: 03/10/2020 INDICATION: Symptomatic right-sided pleural effusion. Please from ultrasound-guided thoracentesis for diagnostic purposes. EXAM: US THORACENTESIS ASP PLEURAL SPACE W/IMG GUIDE COMPARISON:  Chest CT - 03/09/2020; chest radiograph - 03/10/2019 MEDICATIONS: None. COMPLICATIONS: SIR Level A - No therapy, no consequence. Procedure complicated by development of a tiny right basilar ex vacuo hydropneumothorax. TECHNIQUE: Informed written consent was obtained from the patient after a discussion of  the risks, benefits and alternatives to  treatment. A timeout was performed prior to the initiation of the procedure. Initial ultrasound scanning demonstrates a moderate to large sized residual anechoic right-sided pleural effusion. The skin overlying the inferolateral lower chest was prepped and draped in the usual sterile fashion. 1% lidocaine was used for local anesthesia. An ultrasound image was saved for documentation purposes. An 8 Fr Safe-T-Centesis catheter was introduced. The thoracentesis was performed. The catheter was removed and a dressing was applied. The patient tolerated the procedure well without immediate post procedural complication. The patient was escorted to have an upright chest radiograph. FINDINGS: A total of approximately 1.2 liters of bloody pleural fluid was removed. Requested samples were sent to the laboratory. IMPRESSION: Successful ultrasound-guided right sided thoracentesis yielding 1.2 liters of bloody pleural fluid. Electronically Signed   By: Sandi Mariscal M.D.   On: 03/10/2020 14:23     Labs:   Basic Metabolic Panel: Recent Labs  Lab 03/14/20 0455 03/14/20 0455 03/15/20 0550 03/15/20 0550 03/16/20 0215 03/16/20 0215 03/19/20 0406 03/20/20 1005  NA 136  --  135  --  134*  --  134* 135  K 3.4*   < > 3.3*   < > 4.1   < > 3.4* 3.2*  CL 82*  --  90*  --  94*  --  101 101  CO2 38*  --  34*  --  29  --  26 25  GLUCOSE 106*  --  105*  --  94  --  88 115*  BUN 22  --  23  --  22  --  13 13  CREATININE 0.70  --  0.56  --  0.65  --  0.55 0.63  CALCIUM 8.8*  --  8.6*  --  8.2*  --  8.4* 8.5*  MG 1.8  --  1.7  --  2.2  --  1.8 1.8  PHOS 2.9  --  2.6  --  2.3*  --   --   --    < > = values in this interval not displayed.   GFR Estimated Creatinine Clearance: 69.4 mL/min (by C-G formula based on SCr of 0.63 mg/dL). Liver Function Tests: Recent Labs  Lab 03/14/20 0455 03/15/20 0550  AST 19 17  ALT 6 5  ALKPHOS 94 88  BILITOT 1.5* 1.4*  PROT 6.8 6.6  ALBUMIN 2.4* 2.4*   No results for input(s):  LIPASE, AMYLASE in the last 168 hours. No results for input(s): AMMONIA in the last 168 hours. Coagulation profile No results for input(s): INR, PROTIME in the last 168 hours.  CBC: Recent Labs  Lab 03/14/20 0457 03/14/20 0457 03/15/20 0550 03/16/20 0215 03/17/20 0505 03/18/20 0514 03/19/20 0406  WBC 7.9   < > 7.2 8.0 8.5 7.4 6.7  NEUTROABS 5.8  --  5.3  --   --   --   --   HGB 10.6*   < > 10.9* 9.7* 10.8* 9.0* 9.2*  HCT 34.0*   < > 34.9* 31.9* 36.6 30.3* 31.4*  MCV 80.0   < > 80.2 81.8 84.7 84.4 84.4  PLT 283   < > 298 254 243 244 239   < > = values in this interval not displayed.   Cardiac Enzymes: No results for input(s): CKTOTAL, CKMB, CKMBINDEX, TROPONINI in the last 168 hours. BNP: Invalid input(s): POCBNP CBG: Recent Labs  Lab 03/19/20 2124 03/19/20 2328 03/20/20 0429 03/20/20 0745 03/20/20 1133  GLUCAP 98 74 79  80 100*   D-Dimer No results for input(s): DDIMER in the last 72 hours. Hgb A1c No results for input(s): HGBA1C in the last 72 hours. Lipid Profile No results for input(s): CHOL, HDL, LDLCALC, TRIG, CHOLHDL, LDLDIRECT in the last 72 hours. Thyroid function studies No results for input(s): TSH, T4TOTAL, T3FREE, THYROIDAB in the last 72 hours.  Invalid input(s): FREET3 Anemia work up No results for input(s): VITAMINB12, FOLATE, FERRITIN, TIBC, IRON, RETICCTPCT in the last 72 hours. Microbiology Recent Results (from the past 240 hour(s))  MRSA PCR Screening     Status: None   Collection Time: 03/10/20  3:58 PM   Specimen: Nasopharyngeal  Result Value Ref Range Status   MRSA by PCR NEGATIVE NEGATIVE Final    Comment:        The GeneXpert MRSA Assay (FDA approved for NASAL specimens only), is one component of a comprehensive MRSA colonization surveillance program. It is not intended to diagnose MRSA infection nor to guide or monitor treatment for MRSA infections. Performed at [facility], [address] [place], [place]  [address]   Respiratory Panel by RT PCR (Flu A&B, Covid) - Nasopharyngeal Swab     Status: None   Collection Time: 03/20/20 10:05 AM   Specimen: Nasopharyngeal Swab  Result Value Ref Range Status   SARS Coronavirus 2 by RT PCR NEGATIVE NEGATIVE Final    Comment: (NOTE) SARS-CoV-2 target nucleic acids are NOT DETECTED. The SARS-CoV-2 RNA is generally detectable in upper respiratoy specimens during the acute phase of infection. The lowest concentration of SARS-CoV-2 viral copies this assay can detect is 131 copies/mL. A negative result does not preclude SARS-Cov-2 infection and should not be used as the sole basis for treatment or other patient management decisions. A negative result may occur with  improper specimen collection/handling, submission of specimen other than nasopharyngeal swab, presence of viral mutation(s) within the areas targeted by this assay, and inadequate number of viral copies (<131 copies/mL). A negative result must be combined with clinical observations, patient history, and epidemiological information. The expected result is Negative. Fact Sheet for Patients:  PinkCheek.be Fact Sheet for Healthcare Providers:  GravelBags.it This test is not yet ap proved or cleared by the [place] FDA and  has been authorized for detection and/or diagnosis of SARS-CoV-2 by FDA under an Emergency Use Authorization (EUA). This EUA will remain  in effect (meaning this test can be used) for the duration of the COVID-19 declaration under Section 564(b)(1) of the Act, 21 U.S.C. section 360bbb-3(b)(1), unless the authorization is terminated or revoked sooner.    Influenza A by PCR NEGATIVE NEGATIVE Final   Influenza B by PCR NEGATIVE NEGATIVE Final    Comment: (NOTE) The Xpert Xpress SARS-CoV-2/FLU/RSV assay is intended as an aid in  the diagnosis of influenza from Nasopharyngeal swab specimens and  should not be used as a  sole basis for treatment. Nasal washings and  aspirates are unacceptable for Xpert Xpress SARS-CoV-2/FLU/RSV  testing. Fact Sheet for Patients: PinkCheek.be Fact Sheet for Healthcare Providers: GravelBags.it This test is not yet approved or cleared by the [place] FDA and  has been authorized for detection and/or diagnosis of SARS-CoV-2 by  FDA under an Emergency Use Authorization (EUA). This EUA will remain  in effect (meaning this test can be used) for the duration of the  Covid-19 declaration under Section 564(b)(1) of the Act, 21  U.S.C. section 360bbb-3(b)(1), unless the authorization is  terminated or revoked. Performed at [facility], [phone]  [address], [place], [place] [address]      Discharge Instructions:   Discharge Instructions    DIET - DYS 1   Complete by: As directed    Low salt diet   Fluid consistency: Thin   Discharge instructions   Complete by: As directed    Follow up with PCP within 3 days of discharge.  PICC line should be removed after completion of IV antibiotics.  Monitor basic metabolic panel, phosphorus and magnesium levels   Increase activity slowly   Complete by: As directed      Allergies as of 03/20/2020      Reactions   Latex    Itching Itching      Medication List    STOP taking these medications   ertapenem  IVPB Commonly known as: INVANZ     TAKE these medications   albuterol 108 (90 Base) MCG/ACT inhaler Commonly known as: VENTOLIN HFA Inhale 2 puffs into the lungs every 4 (four) hours as needed for wheezing or shortness of breath.   busPIRone 10 MG tablet Commonly known as: BUSPAR Take 1 tablet (10 mg total) by mouth 3 (three) times daily.   calcium carbonate 500 MG chewable tablet Commonly known as: TUMS - dosed in mg elemental calcium Chew 1 tablet by mouth every 4 (four) hours as needed for indigestion or heartburn.   ceFAZolin  IVPB Commonly known  as: ANCEF Inject 2 g into the vein every 8 (eight) hours. Indication:  MSSA bacteremia Last Day of Therapy:  03/26/2020 Labs - Once weekly:  CBC/D, CMP, CRP Please pull PIC at completion of IV antibiotics   Creon 36000 UNITS Cpep capsule Generic drug: lipase/protease/amylase Takes 2 capsules three times daily.   cyclobenzaprine 5 MG tablet Commonly known as: FLEXERIL Take 1 tablet (5 mg total) by mouth 3 (three) times daily as needed for muscle spasms.   diltiazem 120 MG 24 hr capsule Commonly known as: CARDIZEM CD Take 2 capsules (240 mg total) by mouth daily.   Eliquis 5 MG Tabs tablet Generic drug: apixaban Take 1 tablet (5 mg total) by mouth 2 (two) times daily.   esomeprazole 20 MG capsule Commonly known as: NEXIUM TAKE 1 CAPSULE BY MOUTH EVERY DAY   gabapentin 600 MG tablet Commonly known as: NEURONTIN TAKE 1 TABLET BY MOUTH THREE TIMES A DAY   HEPARIN (PORCINE) LOCK FLUSH IV Inject into the vein See admin instructions. Flush IV port after IV ABX following saline flush every 8 hours   Humira Pen 40 MG/0.4ML Pnkt Generic drug: Adalimumab Inject 40 mg as directed. Every 2 weeks   hydrochlorothiazide 25 MG tablet Commonly known as: HYDRODIURIL Take 0.5 tablets (12.5 mg total) by mouth daily.   hydroxychloroquine 200 MG tablet Commonly known as: PLAQUENIL Take 200 mg by mouth 2 (two) times daily.   hydrOXYzine 50 MG tablet Commonly known as: ATARAX/VISTARIL Take 1 tablet (50 mg total) by mouth daily as needed for anxiety.   hyoscyamine 0.125 MG tablet Commonly known as: LEVSIN Take 0.125 mg by mouth 3 (three) times daily before meals.   ibuprofen 400 MG tablet Commonly known as: ADVIL Take 400 mg by mouth every 8 (eight) hours as needed.   Normal Saline Flush 0.9 % Soln Inject 10 mLs into the vein. Flush IV port before ABX every 8 hours   potassium chloride 10 MEQ tablet Commonly known as: KLOR-CON Take 1 tablet (10 mEq total) by mouth daily for 5  days.   promethazine 25 MG  suppository Commonly known as: PHENERGAN Place 25 mg rectally every 6 (six) hours as needed for nausea or vomiting.   propranolol ER 120 MG 24 hr capsule Commonly known as: INDERAL LA Take 1 capsule (120 mg total) by mouth daily.   sertraline 50 MG tablet Commonly known as: ZOLOFT TAKE 1 TABLET BY MOUTH EVERY DAY   traZODone 50 MG tablet Commonly known as: DESYREL Take 1 tablet (50 mg total) by mouth at bedtime.   Trelegy Ellipta 100-62.5-25 MCG/INH Aepb Generic drug: Fluticasone-Umeclidin-Vilant Inhale 1 puff into the lungs daily.         Time coordinating discharge: 35 minutes  Signed:  Hilaria Titsworth  [facility] 03/20/2020, 1:40 PM

## 2020-03-20 NOTE — Progress Notes (Addendum)
Daily Progress Note   Patient Name: Brandy Armstrong       Date: 03/20/2020 DOB: 1946/08/13  Age: 74 y.o. MRN#: [id number] Attending Physician: Jennye Boroughs, MD Primary Care Physician: [name], FNP Admit Date: 03/09/2020  Reason for Consultation/Follow-up: Establishing goals of care  Subjective: Patient is resting in bed. She is alert and oriented. We discussed her poor PO intake. She initially states she would never want a feeding tube, but would want to return to the hospital for further care. Ginger and [name] arrived at bedside. They spoke with her regarding patient's wishes and goals of care. Brandy Armstrong understands she is not eating/drinking enough to sustain her, and would would like to be able to spend time with her friends and family for what time she has left. She is clear that she is not interested in rehabilitation with therapies, or having to stick with a particular diet. She understands there are visitor restrictions at SNF and wants to spend time with her family. She would like to transition to comfort based care.  I completed a MOST form today and the signed original was placed in the chart. A photocopy was placed in the chart to be scanned into EMR. The patient outlined their wishes for the following treatment decisions:  Cardiopulmonary Resuscitation: Do Not Attempt Resuscitation (DNR/No CPR)  Medical Interventions: Comfort Measures: Keep clean, warm, and dry. Use medication by any route, positioning, wound care, and other measures to relieve pain and suffering. Use oxygen, suction and manual treatment of airway obstruction as needed for comfort. Do not transfer to the hospital unless comfort needs cannot be met in current location.  Antibiotics: No antibiotics (use other  measures to relieve symptoms)  IV Fluids: No IV fluids (provide other measures to ensure comfort)  Feeding Tube: No feeding tube    Length of Stay: 11  Current Medications: Scheduled Meds:  . apixaban  5 mg Oral BID  . busPIRone  10 mg Oral TID  . Chlorhexidine Gluconate Cloth  6 each Topical Daily  . diltiazem  240 mg Oral Daily  . feeding supplement (NEPRO CARB STEADY)  237 mL Oral BID BM  . guaiFENesin  600 mg Oral BID  . lipase/protease/amylase  36,000 Units Oral TID WC  . magnesium oxide  200 mg Oral BID  .  metoprolol succinate  12.5 mg Oral Daily  . pantoprazole  40 mg Oral Daily  . potassium chloride  40 mEq Oral Once  . sertraline  50 mg Oral Daily  . sodium chloride flush  10-40 mL Intracatheter Q12H  . traZODone  50 mg Oral QHS    Continuous Infusions: . sodium chloride 250 mL (03/20/20 0542)  .  ceFAZolin (ANCEF) IV 2 g (03/20/20 0543)  . dextrose 5 % and 0.45% NaCl 30 mL/hr at 03/17/20 1345    PRN Meds: sodium chloride, albuterol, calcium carbonate, chlorpheniramine-HYDROcodone, cyclobenzaprine, hydrOXYzine, ipratropium-albuterol, metoprolol tartrate, morphine injection, promethazine, sodium chloride flush  Physical Exam Pulmonary:     Effort: Pulmonary effort is normal.  Neurological:     Mental Status: She is alert.             Vital Signs: BP (!) 145/83 (BP Location: Left Leg)   Pulse 100   Temp 98.5 F (36.9 C) (Oral)   Resp 18   Ht '5\' 6"'$  (1.676 m)   Wt 81.6 kg   SpO2 98%   BMI 29.05 kg/m  SpO2: SpO2: 98 % O2 Device: O2 Device: Room Air O2 Flow Rate: O2 Flow Rate (L/min): 1 L/min  Intake/output summary:  No intake or output data in the 24 hours ending 03/20/20 1342 LBM: Last BM Date: 03/19/20 Baseline Weight: Weight: 81.6 kg Most recent weight: Weight: 81.6 kg       Palliative Assessment/Data: 30%      Patient Active Problem List   Diagnosis Date Noted  . Pneumonia 03/18/2020  . Pleural effusion 03/09/2020  . MSSA bacteremia  02/17/2020  . Pyelonephritis 02/17/2020  . Acute hypoxemic respiratory failure ([place]) 02/17/2020  . Acute encephalopathy 02/17/2020  . Thrombocytopenia ([place]) 02/17/2020  . Sepsis ([place]) 02/16/2020  . Acute lower UTI 02/16/2020  . Toxic metabolic encephalopathy   . Pancreatic insufficiency 01/17/2020  . Pancreatic divisum 01/17/2020  . 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 MDD (major depressive disorder), recurrent episode, moderate ([place]) 08/18/2019  . GAD (generalized anxiety disorder) 08/18/2019  . Panic attacks 08/18/2019  . Cognitive disorder 08/18/2019  . Chest pain 07/09/2019  . Greater trochanteric bursitis of left hip 05/21/2019  . Anesthesia of skin 05/10/2019  . Loose stools 04/07/2019  . Anxiety 04/02/2019  . Appendicitis with perforation 03/09/2019  . Acute appendicitis   . Acute appendicitis with localized peritonitis 02/19/2019  . Chronic diastolic CHF (congestive heart failure) ([place]) 02/10/2019  . Atherosclerosis of aorta ([place]) 02/05/2019  . Bronchitis 12/28/2018  . AAA (abdominal aortic aneurysm) without rupture ([place]) 12/17/2018  . COPD exacerbation ([place]) 12/09/2018  . Iron deficiency anemia due to chronic blood loss 11/03/2018  . Obesity, Class II, BMI 35-39.9 09/23/2018  . Primary osteoarthritis of right knee 09/23/2018  . GERD (gastroesophageal reflux disease) 08/24/2018  . Hives 06/17/2018  . B12 deficiency 06/17/2018  . Anxiety and depression 03/23/2018  . Purpura ([place]) 08/06/2017  . Radicular pain in left arm 09/30/2016  . Cellulitis 09/26/2016  . Abscess 09/24/2016  . PAD (peripheral artery disease) ([place]) 08/13/2016  . Anemia 08/13/2016  . Urinary urgency 08/06/2016  . Thoracic back pain 05/20/2016  . Neuropathy 10/05/2015  . Tremors of nervous system 06/18/2015  . GI bleed due to NSAIDs 05/04/2015  . Dysuria 03/07/2015  . Atrial fibrillation with RVR ([place]) 03/11/2014  . COPD (chronic obstructive pulmonary disease) ([place])  03/11/2014  . Flexor hallucis longus tendinitis 11/09/2013  . Vertigo 09/16/2013  . Chronic steroid use 09/11/2013  .  Osteoarthritis 09/11/2013  . Shortness of breath 08/18/2013  . Generalized weakness 08/18/2013  . Barrett's esophagus 08/18/2013  . Chronic diarrhea 08/18/2013  . Numbness and tingling 08/18/2013  . Screening for breast cancer 08/18/2013  . Rheumatoid arthritis ([place]) 05/31/2013  . Seborrheic keratosis 12/14/2012  . Benign neoplasm of colon 11/23/2012  . Family history of malignant neoplasm of gastrointestinal tract 11/23/2012  . Prediabetes 12/16/2011    Palliative Care Assessment & Plan    Recommendations/Plan: Recommend hospice facility. < 2 weeks given significantly limited PO intake and D/C of abx.     Code Status:    Code Status Orders  (From admission, onward)         Start     Ordered   03/14/20 1516  Limited resuscitation (code)  Continuous    Question Answer Comment  In the event of cardiac or respiratory ARREST: Initiate Code Blue, Call Rapid Response Yes   In the event of cardiac or respiratory ARREST: Perform CPR No   In the event of cardiac or respiratory ARREST: Perform Intubation/Mechanical Ventilation No   In the event of cardiac or respiratory ARREST: Use NIPPV/BiPAp only if indicated Yes   In the event of cardiac or respiratory ARREST: Administer ACLS medications if indicated No   In the event of cardiac or respiratory ARREST: Perform Defibrillation or Cardioversion if indicated No      03/14/20 1516        Code Status History    Date Active Date Inactive Code Status Order ID Comments User Context   03/09/2020 2037 03/14/2020 1516 Full Code [id number]  Mansy, Arvella Merles, MD ED   02/16/2020 1405 02/24/2020 0316 Full Code [id number]  Louellen Molder, MD ED   02/19/2019 0955 03/09/2019 0003 Full Code [id number]  Herbert Pun, MD ED   08/13/2016 2338 08/14/2016 1818 Full Code [id number]  Gladstone Lighter, MD Inpatient   05/04/2015 1355  05/08/2015 2116 Full Code [id number]  Loletha Grayer, MD ED   Advance Care Planning Activity    Advance Directive Documentation     Most Recent Value  Type of Advance Directive  Out of facility DNR (pink MOST or yellow form), Healthcare Power of Attorney  Pre-existing out of facility DNR order (yellow form or pink MOST form)  --  "MOST" Form in Place?  --      Prognosis:  Poor    Thank you for allowing the Palliative Medicine Team to assist in the care of this patient.   Time In: 12:00 Time Out: 2:00 Total Time 120 min Prolonged Time Billed  yes      Greater than 50%  of this time was spent counseling and coordinating care related to the above assessment and plan.  Asencion Gowda, NP  Please contact Palliative Medicine Team phone at [phone] for questions and concerns.

## 2020-03-20 NOTE — Care Management Important Message (Signed)
Important Message  Patient Details  Name: Brandy Armstrong MRN: [id number] Date of Birth: 1946-04-21   Medicare Important Message Given:  Yes     Dannette Barbara 03/20/2020, 11:59 AM

## 2020-03-20 NOTE — Plan of Care (Signed)
  Problem: Clinical Measurements: Goal: Ability to maintain clinical measurements within normal limits will improve Outcome: Progressing   Problem: Pain Managment: Goal: General experience of comfort will improve Outcome: Progressing   Problem: Safety: Goal: Ability to remain free from injury will improve Outcome: Progressing   

## 2020-03-20 NOTE — Progress Notes (Signed)
Physical Therapy Treatment Patient Details Name: Brandy Armstrong MRN: [id number] DOB: November 12, 1946 Today's Date: 03/20/2020    History of Present Illness 74 y.o. female, with history of A. fib on anticoagulation, chronic pancreatic insufficiency, chronic back pain COPD, rheumatoid arthritis here with pleural effusion with possible chest tube placement. Recent admission.    PT Comments    Pt is making limited progress towards goals, needs increased encouragement for participation as pt fairly fatigued from recent bath/bed change. Agreeable to supine there-ex, refusing all OOB mobility. Good endurance with there-ex and improved mental sharpness noted. Plans for SNF dc this date. Will continue to progress as able.   Follow Up Recommendations  SNF     Equipment Recommendations  None recommended by PT    Recommendations for Other Services       Precautions / Restrictions Precautions Precautions: Fall Restrictions Weight Bearing Restrictions: No    Mobility  Bed Mobility               General bed mobility comments: just finished bath/hygiene with CNA. Refuses OOB mobility due to fatigue this date.  Transfers                 General transfer comment: not performed due to refusal  Ambulation/Gait                 Stairs             Wheelchair Mobility    Modified Rankin (Stroke Patients Only)       Balance                                            Cognition Arousal/Alertness: Awake/alert Behavior During Therapy: Flat affect Overall Cognitive Status: Within Functional Limits for tasks assessed                                 General Comments: Agreeable to therapy, however refuses OOB mobility      Exercises Other Exercises Other Exercises: supine ther-ex performed on B LE including quad sets, SAQ, SLRs, heel slides and B UE shoulder flexion, scap squeezes, resisted elbow flexion/extension. ALl ther-ex  performed x 15 reps. Has increased difficulty with R shoulder flexion. All ther-ex performed with min assist     General Comments        Pertinent Vitals/Pain Pain Assessment: No/denies pain    Home Living                      Prior Function            PT Goals (current goals can now be found in the care plan section) Acute Rehab PT Goals Patient Stated Goal: non stated PT Goal Formulation: With patient Time For Goal Achievement: 03/28/20 Potential to Achieve Goals: Fair Progress towards PT goals: Progressing toward goals    Frequency    Min 2X/week      PT Plan Current plan remains appropriate    Co-evaluation              AM-PAC PT "6 Clicks" Mobility   Outcome Measure  Help needed turning from your back to your side while in a flat bed without using bedrails?: A Lot Help needed moving from lying on your back to sitting on the side of  a flat bed without using bedrails?: A Lot Help needed moving to and from a bed to a chair (including a wheelchair)?: A Lot Help needed standing up from a chair using your arms (e.g., wheelchair or bedside chair)?: A Lot Help needed to walk in hospital room?: A Lot Help needed climbing 3-5 steps with a railing? : Total 6 Click Score: 11    End of Session   Activity Tolerance: Patient tolerated treatment well Patient left: in bed;with bed alarm set Nurse Communication: Mobility status PT Visit Diagnosis: Muscle weakness (generalized) (M62.81)     Time: [id number] PT Time Calculation (min) (ACUTE ONLY): 23 min  Charges:  $Therapeutic Exercise: 23-37 mins                     Greggory Stallion, PT, DPT [phone]    Marquay Kruse 03/20/2020, 12:13 PM

## 2020-03-20 NOTE — Progress Notes (Signed)
Progress Note    Brandy Armstrong  GYK:[id number] DOB: [birth year]  DOA: 03/09/2020 PCP: [name], FNP      Brief Narrative:    Medical records reviewed and are as summarized below:  Brandy Armstrong is an 74 y.o. female with a known history of multiple medical problems that are mentioned below including COPD, atrial fibrillation, on Eliquis, peripheral neuropathy and rheumatoid arthritis, who was recently admitted for MSSA bacteremia of unclear etiology for which she continues to take IV antibiotic therapy through PICC line at [facility] facility where she resides.  She presents to the emergency room with acute onset of worsening dyspnea with hypoxemia to 85% on room air with associated cough occasionally productive of yellowish sputum without fever or chills. The patient underwent ultrasound-guided right thoracentesis in the ER with drainage of 1 L of pleural fluid.  She was treated with IV antibiotics.  She completed 8 days of IV cefepime.  She was previously on IV Ancef for MSSA bacteremia and this has been resumed.  She will continue IV Ancef through 03/26/2020.  She also developed atrial fibrillation with rapid ventricular response and required IV Cardizem drip for rate control.  She was previously on Eliquis but she was refusing to take medication so she was treated with IV heparin infusion.  Her condition has slowly improved and IV Cardizem has been switched to oral Cardizem and IV heparin infusion has been switched to Eliquis.    Assessment/Plan:   Principal Problem:   Pleural effusion Active Problems:   Pneumonia  Exudative Right Pleural Effusion  Acute Hypoxic Respiratory Failure:  Hypoxemia has resolved. S/p thoracentesis in ED with 1 L serosanguinous fluid removed on 3/11 3/12 with thoracentesis by IR with 1.2 L bloody pleural fluid -> c/b R basilar ex vacuo hydropneumothorax Repeat CXR 3/12 with interval reduction of R sided pleural effusion ->  tiny R basilar ex vacuo hydropneumothorax.  Improved aeration of the R mid and lower lung with persistent R medial basilar heterogeneous/consolidative opacities (atelectasis vs infiltrate) CXR 3/13 with mild interval worsening in lung aeration on the R consistent with increase in R pleural fluid and atelectasis CXR 3/14 with moderate layering R pleural effusion with R lower lobe atelectasis CXR 3/15 with small R effusion and R basilar atelectasis    Repeat chest x-ray on 03/18/2020 showed stable appearance of cardiomegaly and right pleural effusion and bilateral atelectasis. Completed 8 days of IV cefepime 03/17/2020  exudative by lights No malignancy on pleural fluid cytology No growth from pleural fluid culture quant gold (negative), AFB smear from pleural fluid is negative (AFB cx is pending).  Hold plaquenil and adalimumab     Delirium:  Improving.  She is close to her baseline. TSH (wnl), B12 (wnl), folate (wnl), VBG (without hypercarbia) and head CT (without acute abnormality) She has poor PO intake.  She is not eating and drinking.   Recent MSSA bacteremia. Discontinue IV Ancef   COPD. Continue bronchodilators   Rheumatoid arthritis. Discontinue treatment   Hypertension. Discontinue all medications   Atrial fibrillation with RVR:  Discontinue treatment  Anxiety. Ativan as needed   DVT prophylaxis. Discontinue Eliquis  # Hypokalemia:  Stop treatment  Patient and her family met with the hospice team today and they have decided to pursue comfort measures with hospice.  They have requested that all treatment be discontinued.  Plan to discharge to hospice house when bed available.    Body mass index is 29.05  kg/m.   Family Communication/Anticipated D/C date and plan/Code Status   DVT prophylaxis: Eliquis Code Status: DNR Family Communication: Plan discussed with her family (Ginger and [name]) over the phone.   Disposition Plan: Patient is from SNF.   Patient has been transitioned to comfort measures with hospice.  Plan to discharge to hospice facility when bed available.    Subjective:   She has no complaints.  No shortness of breath or chest pain.  Objective:    Vitals:   03/20/20 0119 03/20/20 0210 03/20/20 0743 03/20/20 1128  BP:  123/86 (!) 150/73 (!) 145/83  Pulse:  (!) 113 (!) 102 100  Resp:  _0 Temp:  98.5 F (36.9 C) 98.2 F (36.8 C) 98.5 F (36.9 C)  TempSrc:  Oral Axillary Oral  SpO2:  97% 99% 98%  Weight: 81.6 kg     Height:       No intake or output data in the 24 hours ending 03/20/20 1406 Filed Weights   03/18/20 0513 03/19/20 0355 03/20/20 0119  Weight: 82.1 kg 81.6 kg 81.6 kg    Exam:  GEN: NAD SKIN: No rash EYES: EOMI ENT: MMM, no oral ulcers or lesions seen in the mouth CV: irregular rate and rhythm, slightly tachycardic PULM: CTA B ABD: soft, ND, NT, +BS CNS: AAO x 3, non focal EXT: No edema or tenderness    Data Reviewed:   I have personally reviewed following labs and imaging studies:  Labs: Labs show the following:   Basic Metabolic Panel: Recent Labs  Lab 03/14/20 0455 03/14/20 0455 03/15/20 0550 03/15/20 0550 03/16/20 0215 03/16/20 0215 03/19/20 0406 03/20/20 1005  NA 136  --  135  --  134*  --  134* 135  K 3.4*   < > 3.3*   < > 4.1   < > 3.4* 3.2*  CL 82*  --  90*  --  94*  --  101 101  CO2 38*  --  34*  --  29  --  26 25  GLUCOSE 106*  --  105*  --  94  --  88 115*  BUN 22  --  23  --  22  --  13 13  CREATININE 0.70  --  0.56  --  0.65  --  0.55 0.63  CALCIUM 8.8*  --  8.6*  --  8.2*  --  8.4* 8.5*  MG 1.8  --  1.7  --  2.2  --  1.8 1.8  PHOS 2.9  --  2.6  --  2.3*  --   --   --    < > = values in this interval not displayed.   GFR Estimated Creatinine Clearance: 69.4 mL/min (by C-G formula based on SCr of 0.63 mg/dL). Liver Function Tests: Recent Labs  Lab 03/14/20 0455 03/15/20 0550  AST 19 17  ALT 6 5  ALKPHOS 94 88  BILITOT 1.5* 1.4*  PROT  6.8 6.6  ALBUMIN 2.4* 2.4*   No results for input(s): LIPASE, AMYLASE in the last 168 hours. No results for input(s): AMMONIA in the last 168 hours. Coagulation profile No results for input(s): INR, PROTIME in the last 168 hours.  CBC: Recent Labs  Lab 03/14/20 0457 03/14/20 0457 03/15/20 0550 03/16/20 0215 03/17/20 0505 03/18/20 0514 03/19/20 0406  WBC 7.9   < > 7.2 8.0 8.5 7.4 6.7  NEUTROABS 5.8  --  5.3  --   --   --   --  HGB 10.6*   < > 10.9* 9.7* 10.8* 9.0* 9.2*  HCT 34.0*   < > 34.9* 31.9* 36.6 30.3* 31.4*  MCV 80.0   < > 80.2 81.8 84.7 84.4 84.4  PLT 283   < > 298 254 243 244 239   < > = values in this interval not displayed.   Cardiac Enzymes: No results for input(s): CKTOTAL, CKMB, CKMBINDEX, TROPONINI in the last 168 hours. BNP (last 3 results) No results for input(s): PROBNP in the last 8760 hours. CBG: Recent Labs  Lab 03/19/20 2124 03/19/20 2328 03/20/20 0429 03/20/20 0745 03/20/20 1133  GLUCAP 98 74 79 80 100*   D-Dimer: No results for input(s): DDIMER in the last 72 hours. Hgb A1c: No results for input(s): HGBA1C in the last 72 hours. Lipid Profile: No results for input(s): CHOL, HDL, LDLCALC, TRIG, CHOLHDL, LDLDIRECT in the last 72 hours. Thyroid function studies: No results for input(s): TSH, T4TOTAL, T3FREE, THYROIDAB in the last 72 hours.  Invalid input(s): FREET3 Anemia work up: No results for input(s): VITAMINB12, FOLATE, FERRITIN, TIBC, IRON, RETICCTPCT in the last 72 hours. Sepsis Labs: Recent Labs  Lab 03/16/20 0215 03/17/20 0505 03/18/20 0514 03/19/20 0406  WBC 8.0 8.5 7.4 6.7    Microbiology Recent Results (from the past 240 hour(s))  MRSA PCR Screening     Status: None   Collection Time: 03/10/20  3:58 PM   Specimen: Nasopharyngeal  Result Value Ref Range Status   MRSA by PCR NEGATIVE NEGATIVE Final    Comment:        The GeneXpert MRSA Assay (FDA approved for NASAL specimens only), is one component of  a comprehensive MRSA colonization surveillance program. It is not intended to diagnose MRSA infection nor to guide or monitor treatment for MRSA infections. Performed at [facility], [address] [place], [place] [address]   Respiratory Panel by RT PCR (Flu A&B, Covid) - Nasopharyngeal Swab     Status: None   Collection Time: 03/20/20 10:05 AM   Specimen: Nasopharyngeal Swab  Result Value Ref Range Status   SARS Coronavirus 2 by RT PCR NEGATIVE NEGATIVE Final    Comment: (NOTE) SARS-CoV-2 target nucleic acids are NOT DETECTED. The SARS-CoV-2 RNA is generally detectable in upper respiratoy specimens during the acute phase of infection. The lowest concentration of SARS-CoV-2 viral copies this assay can detect is 131 copies/mL. A negative result does not preclude SARS-Cov-2 infection and should not be used as the sole basis for treatment or other patient management decisions. A negative result may occur with  improper specimen collection/handling, submission of specimen other than nasopharyngeal swab, presence of viral mutation(s) within the areas targeted by this assay, and inadequate number of viral copies (<131 copies/mL). A negative result must be combined with clinical observations, patient history, and epidemiological information. The expected result is Negative. Fact Sheet for Patients:  PinkCheek.be Fact Sheet for Healthcare Providers:  GravelBags.it This test is not yet ap proved or cleared by the [place] FDA and  has been authorized for detection and/or diagnosis of SARS-CoV-2 by FDA under an Emergency Use Authorization (EUA). This EUA will remain  in effect (meaning this test can be used) for the duration of the COVID-19 declaration under Section 564(b)(1) of the Act, 21 U.S.C. section 360bbb-3(b)(1), unless the authorization is terminated or revoked sooner.    Influenza A by PCR NEGATIVE  NEGATIVE Final   Influenza B by PCR NEGATIVE NEGATIVE Final    Comment: (NOTE) The Xpert Xpress  SARS-CoV-2/FLU/RSV assay is intended as an aid in  the diagnosis of influenza from Nasopharyngeal swab specimens and  should not be used as a sole basis for treatment. Nasal washings and  aspirates are unacceptable for Xpert Xpress SARS-CoV-2/FLU/RSV  testing. Fact Sheet for Patients: PinkCheek.be Fact Sheet for Healthcare Providers: GravelBags.it This test is not yet approved or cleared by the [place] FDA and  has been authorized for detection and/or diagnosis of SARS-CoV-2 by  FDA under an Emergency Use Authorization (EUA). This EUA will remain  in effect (meaning this test can be used) for the duration of the  Covid-19 declaration under Section 564(b)(1) of the Act, 21  U.S.C. section 360bbb-3(b)(1), unless the authorization is  terminated or revoked. Performed at [facility], [address] [place], [place] [address]     Procedures and diagnostic studies:  DG Chest [facility] 1 View  Result Date: 03/18/2020 CLINICAL DATA:  Pleural effusion RIGHT side. Previous thoracentesis. PICC line with antibiotic treatment. Productive cough. EXAM: PORTABLE CHEST 1 VIEW COMPARISON:  03/15/2020 FINDINGS: Patient has RIGHT-sided PICC line, tip overlying the level of the superior vena cava. Heart size is enlarged and stable in configuration. Persistent RIGHT pleural effusion. Bibasilar atelectasis. IMPRESSION: Stable appearance of cardiomegaly and RIGHT pleural effusion and bilateral atelectasis. Electronically Signed   By: Nolon Nations M.D.   On: 03/18/2020 16:25    Medications:   . apixaban  5 mg Oral BID  . busPIRone  10 mg Oral TID  . Chlorhexidine Gluconate Cloth  6 each Topical Daily  . diltiazem  240 mg Oral Daily  . feeding supplement (NEPRO CARB STEADY)  237 mL Oral BID BM  . guaiFENesin  600 mg Oral BID  .  lipase/protease/amylase  36,000 Units Oral TID WC  . magnesium oxide  200 mg Oral BID  . metoprolol succinate  12.5 mg Oral Daily  . pantoprazole  40 mg Oral Daily  . sertraline  50 mg Oral Daily  . sodium chloride flush  10-40 mL Intracatheter Q12H  . traZODone  50 mg Oral QHS   Continuous Infusions: . sodium chloride 250 mL (03/20/20 0542)  .  ceFAZolin (ANCEF) IV 2 g (03/20/20 1356)  . dextrose 5 % and 0.45% NaCl 30 mL/hr at 03/17/20 1345     LOS: 11 days   Jaycee Mckellips  [facility]     03/20/2020, 2:06 PM

## 2020-03-20 NOTE — Progress Notes (Signed)
New referral for [facility] hosp[facility] home received from [place]. Patient information sent to referral. Writer to speak with family and patient on 3/23 pending approval by hospice medical director. Thank you for this referral. Flo Shanks BSN, RN, [place] [phone]

## 2020-03-21 MED ORDER — LIDOCAINE HCL (PF) 2 % IJ SOLN
INTRAMUSCULAR | Status: AC
  Filled 2020-03-21: qty 5

## 2020-03-21 MED ORDER — PROPOFOL 10 MG/ML IV BOLUS
INTRAVENOUS | Status: AC
  Filled 2020-03-21: qty 20

## 2020-03-21 MED ORDER — FENTANYL CITRATE (PF) 100 MCG/2ML IJ SOLN
INTRAMUSCULAR | Status: AC
  Filled 2020-03-21: qty 2

## 2020-03-21 MED ORDER — DEXAMETHASONE SODIUM PHOSPHATE 10 MG/ML IJ SOLN
INTRAMUSCULAR | Status: AC
  Filled 2020-03-21: qty 1

## 2020-03-21 MED ORDER — ONDANSETRON HCL 4 MG/2ML IJ SOLN
INTRAMUSCULAR | Status: AC
  Filled 2020-03-21: qty 2

## 2020-03-21 NOTE — Progress Notes (Signed)
EMS notified for nonemergent transfer to the hospice home. Staff RN Colletta [place] and aide Gerald Stabs made aware. Flo Shanks BSN RN, [place] [phone]

## 2020-03-21 NOTE — Progress Notes (Addendum)
Daily Progress Note   Patient Name: Brandy Armstrong       Date: 03/21/2020 DOB: 12/29/1946  Age: 74 y.o. MRN#: [id number] Attending Physician: Jennye Boroughs, MD Primary Care Physician: [name], FNP Admit Date: 03/09/2020  Reason for Consultation/Follow-up: Establishing goals of care  Subjective: Patient is resting in bed. She has breakfast in front of her which she has not touched, except a sip of tea and Nepro. She does not want to take routine medications, and requests medications only for comfort and sleep. She no longer wants to use medications to control her A-fib with RVR which was managed by IV cardizem in-patient. She does not want to resume Eliquis. She does not wish to complete her antibiotics for MSSA bacteremia. She does not want only a dysphagia diet. Given this, her prognosis is < 2 weeks.   I completed a MOST form today and the signed original was placed in the chart. A photocopy was placed in the chart to be scanned into EMR. The patient outlined their wishes for the following treatment decisions:  Cardiopulmonary Resuscitation: Do Not Attempt Resuscitation (DNR/No CPR)  Medical Interventions: Comfort Measures: Keep clean, warm, and dry. Use medication by any route, positioning, wound care, and other measures to relieve pain and suffering. Use oxygen, suction and manual treatment of airway obstruction as needed for comfort. Do not transfer to the hospital unless comfort needs cannot be met in current location.  Antibiotics: No antibiotics (use other measures to relieve symptoms)  IV Fluids: No IV fluids (provide other measures to ensure comfort)  Feeding Tube: No feeding tube    Length of Stay: 12  Current Medications: Scheduled Meds:  . busPIRone  10 mg Oral TID  .  Chlorhexidine Gluconate Cloth  6 each Topical Daily  . feeding supplement (NEPRO CARB STEADY)  237 mL Oral BID BM  . lipase/protease/amylase  36,000 Units Oral TID WC  . pantoprazole  40 mg Oral Daily  . sertraline  50 mg Oral Daily  . sodium chloride flush  10-40 mL Intracatheter Q12H  . traZODone  50 mg Oral QHS    Continuous Infusions:   PRN Meds: acetaminophen **OR** acetaminophen, albuterol, antiseptic oral rinse, calcium carbonate, cyclobenzaprine, glycopyrrolate **OR** glycopyrrolate **OR** glycopyrrolate, haloperidol **OR** haloperidol **OR** haloperidol lactate, ipratropium-albuterol, LORazepam **OR** LORazepam **OR** LORazepam, morphine  injection, ondansetron **OR** ondansetron (ZOFRAN) IV, polyvinyl alcohol  Physical Exam Pulmonary:     Effort: Pulmonary effort is normal.  Neurological:     Mental Status: She is alert.             Vital Signs: BP 113/79 (BP Location: Left Arm)   Pulse (!) 113   Temp 99 F (37.2 C) (Oral)   Resp 20   Ht '5\' 6"'$  (1.676 m)   Wt 81.6 kg   SpO2 95%   BMI 29.05 kg/m  SpO2: SpO2: 95 % O2 Device: O2 Device: Room Air O2 Flow Rate: O2 Flow Rate (L/min): 1 L/min  Intake/output summary:   Intake/Output Summary (Last 24 hours) at 03/21/2020 1013 Last data filed at 03/21/2020 5993 Gross per 24 hour  Intake 10 ml  Output 300 ml  Net -290 ml   LBM: Last BM Date: 03/21/20 Baseline Weight: Weight: 81.6 kg Most recent weight: Weight: 81.6 kg       Palliative Assessment/Data: 30%      Patient Active Problem List   Diagnosis Date Noted  . Pneumonia 03/18/2020  . Pleural effusion 03/09/2020  . MSSA bacteremia 02/17/2020  . Pyelonephritis 02/17/2020  . Acute hypoxemic respiratory failure ([place]) 02/17/2020  . Acute encephalopathy 02/17/2020  . Thrombocytopenia ([place]) 02/17/2020  . Sepsis ([place]) 02/16/2020  . Acute lower UTI 02/16/2020  . Toxic metabolic encephalopathy   . Pancreatic insufficiency 01/17/2020  . Pancreatic divisum  01/17/2020  . 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 MDD (major depressive disorder), recurrent episode, moderate ([place]) 08/18/2019  . GAD (generalized anxiety disorder) 08/18/2019  . Panic attacks 08/18/2019  . Cognitive disorder 08/18/2019  . Chest pain 07/09/2019  . Greater trochanteric bursitis of left hip 05/21/2019  . Anesthesia of skin 05/10/2019  . Loose stools 04/07/2019  . Anxiety 04/02/2019  . Appendicitis with perforation 03/09/2019  . Acute appendicitis   . Acute appendicitis with localized peritonitis 02/19/2019  . Chronic diastolic CHF (congestive heart failure) ([place]) 02/10/2019  . Atherosclerosis of aorta ([place]) 02/05/2019  . Bronchitis 12/28/2018  . AAA (abdominal aortic aneurysm) without rupture ([place]) 12/17/2018  . COPD exacerbation ([place]) 12/09/2018  . Iron deficiency anemia due to chronic blood loss 11/03/2018  . Obesity, Class II, BMI 35-39.9 09/23/2018  . Primary osteoarthritis of right knee 09/23/2018  . GERD (gastroesophageal reflux disease) 08/24/2018  . Hives 06/17/2018  . B12 deficiency 06/17/2018  . Anxiety and depression 03/23/2018  . Purpura ([place]) 08/06/2017  . Radicular pain in left arm 09/30/2016  . Cellulitis 09/26/2016  . Abscess 09/24/2016  . PAD (peripheral artery disease) ([place]) 08/13/2016  . Anemia 08/13/2016  . Urinary urgency 08/06/2016  . Thoracic back pain 05/20/2016  . Neuropathy 10/05/2015  . Tremors of nervous system 06/18/2015  . GI bleed due to NSAIDs 05/04/2015  . Dysuria 03/07/2015  . Atrial fibrillation with RVR ([place]) 03/11/2014  . COPD (chronic obstructive pulmonary disease) ([place]) 03/11/2014  . Flexor hallucis longus tendinitis 11/09/2013  . Vertigo 09/16/2013  . Chronic steroid use 09/11/2013  . Osteoarthritis 09/11/2013  . Shortness of breath 08/18/2013  . Generalized weakness 08/18/2013  . Barrett's esophagus 08/18/2013  . Chronic diarrhea 08/18/2013  . Numbness and tingling 08/18/2013  . Screening  for breast cancer 08/18/2013  . Rheumatoid arthritis ([place]) 05/31/2013  . Seborrheic keratosis 12/14/2012  . Benign neoplasm of colon 11/23/2012  . Family history of malignant neoplasm of gastrointestinal tract 11/23/2012  . Prediabetes 12/16/2011    Palliative Care Assessment &  Plan    Recommendations/Plan: Recommend placement at a hospice facility of patient/family choice. < 2 weeks given lack of sustainable PO intake, D/C of cardiac meds, and D/C of abx for MSSA bacetermia.     Code Status:    Code Status Orders  (From admission, onward)         Start     Ordered   03/14/20 1516  Limited resuscitation (code)  Continuous    Question Answer Comment  In the event of cardiac or respiratory ARREST: Initiate Code Blue, Call Rapid Response Yes   In the event of cardiac or respiratory ARREST: Perform CPR No   In the event of cardiac or respiratory ARREST: Perform Intubation/Mechanical Ventilation No   In the event of cardiac or respiratory ARREST: Use NIPPV/BiPAp only if indicated Yes   In the event of cardiac or respiratory ARREST: Administer ACLS medications if indicated No   In the event of cardiac or respiratory ARREST: Perform Defibrillation or Cardioversion if indicated No      03/14/20 1516        Code Status History    Date Active Date Inactive Code Status Order ID Comments User Context   03/09/2020 2037 03/14/2020 1516 Full Code [id number]  Mansy, Arvella Merles, MD ED   02/16/2020 1405 02/24/2020 0316 Full Code [id number]  Louellen Molder, MD ED   02/19/2019 0955 03/09/2019 0003 Full Code [id number]  Herbert Pun, MD ED   08/13/2016 2338 08/14/2016 1818 Full Code [id number]  Gladstone Lighter, MD Inpatient   05/04/2015 1355 05/08/2015 2116 Full Code [id number]  Loletha Grayer, MD ED   Advance Care Planning Activity    Advance Directive Documentation     Most Recent Value  Type of Advance Directive  Out of facility DNR (pink MOST or yellow form), Healthcare Power of Attorney   Pre-existing out of facility DNR order (yellow form or pink MOST form)  --  "MOST" Form in Place?  --      Prognosis: < 2 weeks. See note for further    Thank you for allowing the Palliative Medicine Team to assist in the care of this patient.   Total Time 15 min Prolonged Time Billed  no      Greater than 50%  of this time was spent counseling and coordinating care related to the above assessment and plan.  Asencion Gowda, NP  Please contact Palliative Medicine Team phone at [phone] for questions and concerns.

## 2020-03-21 NOTE — Progress Notes (Signed)
Patient discharged to hospice home.  PICC line in place.

## 2020-03-21 NOTE — Progress Notes (Signed)
Visit made to new referral for [facility] hospice home. Patient has been appoved for admission to the hospice home and a bed is available today. Writer spoke in the room with patient, her cousin [name] friend Ginger to initiate education regarding hospice services, philosophy, team approach to care and current visitation policy with understanding voiced. Questions answered. [name] will meet with hospice social worker at the hospice home at 1:30 to sign consents. Writer to arrange transport and call report to the hospice home. Hospital care team updated. Thank you for this referral. Flo Shanks BSN, RN, [place] [phone]

## 2020-03-21 NOTE — Progress Notes (Signed)
Patient alert and oriented this am.  Patient is refused all her PO meds this am.  She states she does not want or need them at this time.  Dr. Mal Misty made aware.

## 2020-03-21 NOTE — Discharge Summary (Addendum)
Physician Discharge Summary  Brandy Armstrong [id number] DOB: 31-Jul-1946 DOA: 03/09/2020  PCP: [name], FNP  Admit date: 03/09/2020 Discharge date: 03/21/2020  Discharge disposition: Hospice house   Recommendations for Outpatient Follow-Up:   Follow-up with hospice team at hospice house   Discharge Diagnosis:   Principal Problem:   Pleural effusion Active Problems:   Pneumonia    Discharge Condition: Stable.  Diet recommendation: Regular diet  Code status: DNR    Hospital Course:   [name] is a 74 y.o. female with  multiple medicalproblemsincluding COPD, atrial fibrillation, on Eliquis, peripheral neuropathyandrheumatoid arthritis, who was recently admitted for MSSA bacteremia of unclear etiology for which she continues to take IV antibiotic therapy through PICC line at [facility] facility where she resides. She presented to the emergency room with acute onset of worsening dyspnea with hypoxemia to 85% on room airwith associated cough occasionally productive of yellowish sputum without fever or chills.The patient underwentultrasound-guidedright thoracentesis in the ER with drainage of 1 L of pleural fluid.  She was treated with IV antibiotics.  She completed 8 days of IV cefepime.  She was previously on IV Ancef for MSSA bacteremia.  She was supposed to complete IV Ancef on 03/26/2020 but this has been discontinued because patient is now on comfort measures with hospice.  She was seen by speech therapist for dysphagia and a dysphagia 1 diet was recommended.  However, diet has been changed to regular diet because she has been transitioned to hospice.    She also developed atrial fibrillation with rapid ventricular response and required IV Cardizem drip for rate control.  She was previously on Eliquis but she was refusing to take medications so she was treated with IV heparin infusion.  Her condition has slowly improved and IV  Cardizem has been switched to oral Cardizem and IV heparin infusion has been switched to Eliquis.  Patient had been refusing to take her medications and had been refusing to eat or drink.  She was evaluated by palliative care team and hospice team.  Patient and her family have decided to proceed with comfort measures with hospice.  All her medications have therefore been discontinued.  She is deemed stable for discharge to hospice house today.  Discharge plan was discussed with the patient.  Discharge plan was also discussed with patient's family ([name] and [name] (over the phone-both of them were on speaker phone together)       Discharge Exam:   Vitals:   03/21/20 0727 03/21/20 1120  BP: 113/79 131/83  Pulse: (!) 113 100  Resp: 20 20  Temp: 99 F (37.2 C) 98.3 F (36.8 C)  SpO2: 95% 98%   Vitals:   03/20/20 1613 03/20/20 2130 03/21/20 0727 03/21/20 1120  BP: (!) 149/87 (!) 104/51 113/79 131/83  Pulse: 97 94 (!) 113 100  Resp: 18 16 20 20   Temp: 98.1 F (36.7 C) 98.6 F (37 C) 99 F (37.2 C) 98.3 F (36.8 C)  TempSrc: Oral Oral Oral Oral  SpO2: 98% 97% 95% 98%  Weight:      Height:         GEN: NAD SKIN: No rash EYES: EOMI ENT: MMM, no oral ulcers or lesions seen CV: irregular rate and rhythm PULM: CTA B ABD: soft, ND, NT, +BS CNS: AAO x 3, non focal EXT: No edema or tenderness   The results of significant diagnostics from this hospitalization (including imaging, microbiology, ancillary  and laboratory) are listed below for reference.     Procedures and Diagnostic Studies:   CT Chest Wo Contrast  Result Date: 03/09/2020 CLINICAL DATA:  Pleural effusion EXAM: CT CHEST WITHOUT CONTRAST TECHNIQUE: Multidetector CT imaging of the chest was performed following the standard protocol without IV contrast. COMPARISON:  Chest x-ray 03/09/2020, CT 03/11/2014 FINDINGS: Cardiovascular: Limited evaluation without intravenous contrast. Moderate aortic  atherosclerosis without aneurysm. Coronary vascular calcification. Mild cardiomegaly. No significant pericardial effusion. Mediastinum/Nodes: Midline trachea. No thyroid mass. Esophagus within normal limits. Borderline enlarged low right paratracheal lymph node measuring 1 cm, series 2, image number 50. Lungs/Pleura: Moderate to large residual right pleural effusion. Emphysema. Consolidation within the right middle lobe and right lower lobe with air bronchograms. The left lung shows no focal airspace disease. Upper Abdomen: No acute abnormality. Musculoskeletal: Degenerative changes. No acute or suspicious osseous abnormality. IMPRESSION: 1. Moderate to large residual right pleural effusion. Consolidation within the right middle lobe and right lower lobe which may be secondary to atelectasis or pneumonia. Underlying lung mass could be obscured and imaging follow-up to clearing is recommended. 2. Cardiomegaly Aortic Atherosclerosis (ICD10-I70.0) and Emphysema (ICD10-J43.9). Electronically Signed   By: Donavan Foil M.D.   On: 03/09/2020 22:50   DG Chest Port 1 View  Result Date: 03/10/2020 CLINICAL DATA:  Post right-sided thoracentesis EXAM: PORTABLE CHEST 1 VIEW COMPARISON:  03/09/2020; chest CT-03/09/2020 FINDINGS: Grossly unchanged enlarged cardiac silhouette and mediastinal contours with persistent partial obscuration the right heart border secondary to right medial basilar heterogeneous/consolidative opacities. Interval reduction/near resolution of right-sided pleural effusion post thoracentesis with improved aeration of the right mid and lower lung. Interval development of a tiny loculated right basilar ex vacuo hydropneumothorax. No mediastinal shift. The left hemithorax remains well aerated. No new focal airspace opacities. Mild pulmonary congestion without frank evidence of edema. Stable position of support apparatus. No acute osseous abnormalities. Post cholecystectomy. IMPRESSION: 1. Interval  reduction/near resolution of right-sided pleural effusion post thoracentesis with development of tiny right basilar ex vacuo hydropneumothorax. 2. Improved aeration of the right mid and lower lung with persistent right medial basilar heterogeneous/consolidative opacities, atelectasis versus infiltrate (favored). Electronically Signed   By: Sandi Mariscal M.D.   On: 03/10/2020 14:12   DG Chest Portable 1 View  Result Date: 03/09/2020 CLINICAL DATA:  Status post thoracentesis. EXAM: PORTABLE CHEST 1 VIEW COMPARISON:  Radiograph earlier this day. FINDINGS: Large right pleural effusion with slight decrease in size from earlier today. Small portion of aerated lung at the right lung apex is no seen. Right upper extremity PICC remains in place. No left pleural effusion. Mild left peribronchial thickening. No visualized pneumothorax. IMPRESSION: 1. Decreased size of large right pleural effusion after thoracentesis today. Large volume of pleural fluid persists. No pneumothorax. 2. Right upper extremity PICC remains in place. Electronically Signed   By: Keith Rake M.D.   On: 03/09/2020 19:23   DG Chest Portable 1 View  Result Date: 03/09/2020 CLINICAL DATA:  Shortness of breath EXAM: PORTABLE CHEST 1 VIEW COMPARISON:  02/23/2020 FINDINGS: Right upper extremity catheter tip terminates at expected location of SVC. Interval complete opacification of right thorax. Left lung grossly clear. Obscured cardiomediastinal silhouette. IMPRESSION: Interval complete opacification of the right thorax which may be secondary to large pleural effusion or diffuse airspace disease, or combination of the 2. Central obstructing process is also possible. Electronically Signed   By: Donavan Foil M.D.   On: 03/09/2020 17:43   US THORACENTESIS ASP PLEURAL SPACE W/IMG GUIDE  Result Date: 03/10/2020 INDICATION: Symptomatic right-sided pleural effusion. Please from ultrasound-guided thoracentesis for diagnostic purposes. EXAM: US  THORACENTESIS ASP PLEURAL SPACE W/IMG GUIDE COMPARISON:  Chest CT - 03/09/2020; chest radiograph - 03/10/2019 MEDICATIONS: None. COMPLICATIONS: SIR Level A - No therapy, no consequence. Procedure complicated by development of a tiny right basilar ex vacuo hydropneumothorax. TECHNIQUE: Informed written consent was obtained from the patient after a discussion of the risks, benefits and alternatives to treatment. A timeout was performed prior to the initiation of the procedure. Initial ultrasound scanning demonstrates a moderate to large sized residual anechoic right-sided pleural effusion. The skin overlying the inferolateral lower chest was prepped and draped in the usual sterile fashion. 1% lidocaine was used for local anesthesia. An ultrasound image was saved for documentation purposes. An 8 Fr Safe-T-Centesis catheter was introduced. The thoracentesis was performed. The catheter was removed and a dressing was applied. The patient tolerated the procedure well without immediate post procedural complication. The patient was escorted to have an upright chest radiograph. FINDINGS: A total of approximately 1.2 liters of bloody pleural fluid was removed. Requested samples were sent to the laboratory. IMPRESSION: Successful ultrasound-guided right sided thoracentesis yielding 1.2 liters of bloody pleural fluid. Electronically Signed   By: Sandi Mariscal M.D.   On: 03/10/2020 14:23     Labs:   Basic Metabolic Panel: Recent Labs  Lab 03/15/20 0550 03/15/20 0550 03/16/20 0215 03/16/20 0215 03/19/20 0406 03/20/20 1005  NA 135  --  134*  --  134* 135  K 3.3*   < > 4.1   < > 3.4* 3.2*  CL 90*  --  94*  --  101 101  CO2 34*  --  29  --  26 25  GLUCOSE 105*  --  94  --  88 115*  BUN 23  --  22  --  13 13  CREATININE 0.56  --  0.65  --  0.55 0.63  CALCIUM 8.6*  --  8.2*  --  8.4* 8.5*  MG 1.7  --  2.2  --  1.8 1.8  PHOS 2.6  --  2.3*  --   --   --    < > = values in this interval not displayed.    GFR Estimated Creatinine Clearance: 69.4 mL/min (by C-G formula based on SCr of 0.63 mg/dL). Liver Function Tests: Recent Labs  Lab 03/15/20 0550  AST 17  ALT 5  ALKPHOS 88  BILITOT 1.4*  PROT 6.6  ALBUMIN 2.4*   No results for input(s): LIPASE, AMYLASE in the last 168 hours. No results for input(s): AMMONIA in the last 168 hours. Coagulation profile No results for input(s): INR, PROTIME in the last 168 hours.  CBC: Recent Labs  Lab 03/15/20 0550 03/16/20 0215 03/17/20 0505 03/18/20 0514 03/19/20 0406  WBC 7.2 8.0 8.5 7.4 6.7  NEUTROABS 5.3  --   --   --   --   HGB 10.9* 9.7* 10.8* 9.0* 9.2*  HCT 34.9* 31.9* 36.6 30.3* 31.4*  MCV 80.2 81.8 84.7 84.4 84.4  PLT 298 254 243 244 239   Cardiac Enzymes: No results for input(s): CKTOTAL, CKMB, CKMBINDEX, TROPONINI in the last 168 hours. BNP: Invalid input(s): POCBNP CBG: Recent Labs  Lab 03/20/20 0429 03/20/20 0745 03/20/20 1133 03/20/20 1714 03/20/20 2114  GLUCAP 79 80 100* 75 83   D-Dimer No results for input(s): DDIMER in the last 72 hours. Hgb A1c No results for input(s): HGBA1C in the last 72 hours. Lipid  Profile No results for input(s): CHOL, HDL, LDLCALC, TRIG, CHOLHDL, LDLDIRECT in the last 72 hours. Thyroid function studies No results for input(s): TSH, T4TOTAL, T3FREE, THYROIDAB in the last 72 hours.  Invalid input(s): FREET3 Anemia work up No results for input(s): VITAMINB12, FOLATE, FERRITIN, TIBC, IRON, RETICCTPCT in the last 72 hours. Microbiology Recent Results (from the past 240 hour(s))  Respiratory Panel by RT PCR (Flu A&B, Covid) - Nasopharyngeal Swab     Status: None   Collection Time: 03/20/20 10:05 AM   Specimen: Nasopharyngeal Swab  Result Value Ref Range Status   SARS Coronavirus 2 by RT PCR NEGATIVE NEGATIVE Final    Comment: (NOTE) SARS-CoV-2 target nucleic acids are NOT DETECTED. The SARS-CoV-2 RNA is generally detectable in upper respiratoy specimens during the acute phase  of infection. The lowest concentration of SARS-CoV-2 viral copies this assay can detect is 131 copies/mL. A negative result does not preclude SARS-Cov-2 infection and should not be used as the sole basis for treatment or other patient management decisions. A negative result may occur with  improper specimen collection/handling, submission of specimen other than nasopharyngeal swab, presence of viral mutation(s) within the areas targeted by this assay, and inadequate number of viral copies (<131 copies/mL). A negative result must be combined with clinical observations, patient history, and epidemiological information. The expected result is Negative. Fact Sheet for Patients:  PinkCheek.be Fact Sheet for Healthcare Providers:  GravelBags.it This test is not yet ap proved or cleared by the [place] FDA and  has been authorized for detection and/or diagnosis of SARS-CoV-2 by FDA under an Emergency Use Authorization (EUA). This EUA will remain  in effect (meaning this test can be used) for the duration of the COVID-19 declaration under Section 564(b)(1) of the Act, 21 U.S.C. section 360bbb-3(b)(1), unless the authorization is terminated or revoked sooner.    Influenza A by PCR NEGATIVE NEGATIVE Final   Influenza B by PCR NEGATIVE NEGATIVE Final    Comment: (NOTE) The Xpert Xpress SARS-CoV-2/FLU/RSV assay is intended as an aid in  the diagnosis of influenza from Nasopharyngeal swab specimens and  should not be used as a sole basis for treatment. Nasal washings and  aspirates are unacceptable for Xpert Xpress SARS-CoV-2/FLU/RSV  testing. Fact Sheet for Patients: PinkCheek.be Fact Sheet for Healthcare Providers: GravelBags.it This test is not yet approved or cleared by the [place] FDA and  has been authorized for detection and/or diagnosis of SARS-CoV-2 by  FDA  under an Emergency Use Authorization (EUA). This EUA will remain  in effect (meaning this test can be used) for the duration of the  Covid-19 declaration under Section 564(b)(1) of the Act, 21  U.S.C. section 360bbb-3(b)(1), unless the authorization is  terminated or revoked. Performed at [facility], [address] [place], [place] [address]      Discharge Instructions:   Discharge Instructions    Discharge instructions   Complete by: As directed    Follow up with PCP within 3 days of discharge.  PICC line should be removed after completion of IV antibiotics.  Monitor basic metabolic panel, phosphorus and magnesium levels     Allergies as of 03/21/2020      Reactions   Latex    Itching Itching      Medication List    STOP taking these medications   albuterol 108 (90 Base) MCG/ACT inhaler Commonly known as: VENTOLIN HFA   busPIRone 10 MG tablet Commonly known as: BUSPAR   calcium carbonate 500 MG  chewable tablet Commonly known as: TUMS - dosed in mg elemental calcium   ceFAZolin  IVPB Commonly known as: ANCEF   Creon 36000 UNITS Cpep capsule Generic drug: lipase/protease/amylase   cyclobenzaprine 5 MG tablet Commonly known as: FLEXERIL   diltiazem 120 MG 24 hr capsule Commonly known as: CARDIZEM CD   Eliquis 5 MG Tabs tablet Generic drug: apixaban   ertapenem  IVPB Commonly known as: INVANZ   esomeprazole 20 MG capsule Commonly known as: NEXIUM   gabapentin 600 MG tablet Commonly known as: NEURONTIN   HEPARIN (PORCINE) LOCK FLUSH IV   Humira Pen 40 MG/0.4ML Pnkt Generic drug: Adalimumab   hydrochlorothiazide 25 MG tablet Commonly known as: HYDRODIURIL   hydroxychloroquine 200 MG tablet Commonly known as: PLAQUENIL   hydrOXYzine 50 MG tablet Commonly known as: ATARAX/VISTARIL   hyoscyamine 0.125 MG tablet Commonly known as: LEVSIN   ibuprofen 400 MG tablet Commonly known as: ADVIL   Normal Saline Flush 0.9 % Soln    promethazine 25 MG suppository Commonly known as: PHENERGAN   propranolol ER 120 MG 24 hr capsule Commonly known as: INDERAL LA   sertraline 50 MG tablet Commonly known as: ZOLOFT   traZODone 50 MG tablet Commonly known as: DESYREL   Trelegy Ellipta 100-62.5-25 MCG/INH Aepb Generic drug: Fluticasone-Umeclidin-Vilant         Time coordinating discharge: 35 minutes  Signed:  Fruma Africa  [facility] 03/21/2020, 1:33 PM

## 2020-03-21 NOTE — Progress Notes (Signed)
[facility] responded to unit to remove pt's PICC as ordered. Spoke with pt's nurse, Colletta Maryland; she stated she spoke to the physician as the patient is going to Hospice and needs her PICC to remain in place. PICC removal order to be discontinued.

## 2020-03-22 ENCOUNTER — Ambulatory Visit: Payer: Medicare Other | Admitting: Pulmonary Disease

## 2020-03-22 DIAGNOSIS — R29898 Other symptoms and signs involving the musculoskeletal system: Secondary | ICD-10-CM | POA: Diagnosis not present

## 2020-03-22 DIAGNOSIS — Z743 Need for continuous supervision: Secondary | ICD-10-CM | POA: Diagnosis not present

## 2020-03-22 DIAGNOSIS — M2550 Pain in unspecified joint: Secondary | ICD-10-CM | POA: Diagnosis not present

## 2020-03-22 DIAGNOSIS — Z7401 Bed confinement status: Secondary | ICD-10-CM | POA: Diagnosis not present

## 2020-03-30 ENCOUNTER — Other Ambulatory Visit: Payer: Self-pay | Admitting: Family

## 2020-03-30 DIAGNOSIS — K22719 Barrett's esophagus with dysplasia, unspecified: Secondary | ICD-10-CM

## 2020-03-31 ENCOUNTER — Other Ambulatory Visit: Payer: Self-pay | Admitting: Psychiatry

## 2020-03-31 DIAGNOSIS — F331 Major depressive disorder, recurrent, moderate: Secondary | ICD-10-CM

## 2020-03-31 DIAGNOSIS — F411 Generalized anxiety disorder: Secondary | ICD-10-CM

## 2020-03-31 DIAGNOSIS — F410 Panic disorder [episodic paroxysmal anxiety] without agoraphobia: Secondary | ICD-10-CM

## 2020-04-13 ENCOUNTER — Other Ambulatory Visit: Payer: Self-pay | Admitting: Psychiatry

## 2020-04-13 DIAGNOSIS — F331 Major depressive disorder, recurrent, moderate: Secondary | ICD-10-CM

## 2020-04-13 DIAGNOSIS — F410 Panic disorder [episodic paroxysmal anxiety] without agoraphobia: Secondary | ICD-10-CM

## 2020-04-13 DIAGNOSIS — F411 Generalized anxiety disorder: Secondary | ICD-10-CM

## 2020-04-26 LAB — ACID FAST CULTURE WITH REFLEXED SENSITIVITIES (MYCOBACTERIA): Acid Fast Culture: NEGATIVE

## 2020-05-15 ENCOUNTER — Ambulatory Visit: Payer: Medicare Other | Admitting: Family

## 2020-05-24 ENCOUNTER — Inpatient Hospital Stay

## 2020-05-25 ENCOUNTER — Telehealth: Payer: Self-pay

## 2020-05-25 NOTE — Telephone Encounter (Signed)
Pt scheduled on 5/28 for Md/venofer, but no showed to labs on 5/26. Please reschedule lab/Md/venofer (labs prior) per pt availability. Thanks

## 2020-05-25 NOTE — Telephone Encounter (Signed)
Done..  Pt appts has been sched as requested I was unable to reach her by phone a updated reminder letter will be mailed out.

## 2020-05-26 ENCOUNTER — Inpatient Hospital Stay

## 2020-05-26 ENCOUNTER — Inpatient Hospital Stay: Admitting: Oncology

## 2020-05-30 DIAGNOSIS — J984 Other disorders of lung: Secondary | ICD-10-CM | POA: Diagnosis not present

## 2020-05-30 DIAGNOSIS — J431 Panlobular emphysema: Secondary | ICD-10-CM | POA: Diagnosis not present

## 2020-05-30 DIAGNOSIS — E440 Moderate protein-calorie malnutrition: Secondary | ICD-10-CM | POA: Diagnosis not present

## 2020-05-30 DIAGNOSIS — I10 Essential (primary) hypertension: Secondary | ICD-10-CM | POA: Diagnosis not present

## 2020-05-30 DIAGNOSIS — K2270 Barrett's esophagus without dysplasia: Secondary | ICD-10-CM | POA: Diagnosis not present

## 2020-06-01 DIAGNOSIS — J431 Panlobular emphysema: Secondary | ICD-10-CM | POA: Diagnosis not present

## 2020-06-01 DIAGNOSIS — R1111 Vomiting without nausea: Secondary | ICD-10-CM | POA: Diagnosis not present

## 2020-06-01 DIAGNOSIS — R195 Other fecal abnormalities: Secondary | ICD-10-CM | POA: Diagnosis not present

## 2020-06-01 DIAGNOSIS — Z515 Encounter for palliative care: Secondary | ICD-10-CM | POA: Diagnosis not present

## 2020-06-07 ENCOUNTER — Telehealth: Payer: Self-pay | Admitting: *Deleted

## 2020-06-07 DIAGNOSIS — Z515 Encounter for palliative care: Secondary | ICD-10-CM | POA: Diagnosis not present

## 2020-06-07 DIAGNOSIS — R251 Tremor, unspecified: Secondary | ICD-10-CM | POA: Diagnosis not present

## 2020-06-07 DIAGNOSIS — R000 Tachycardia, unspecified: Secondary | ICD-10-CM | POA: Diagnosis not present

## 2020-06-07 DIAGNOSIS — R195 Other fecal abnormalities: Secondary | ICD-10-CM | POA: Diagnosis not present

## 2020-06-07 NOTE — Telephone Encounter (Signed)
Per pt request to cx her 6/14 lab and 06/14/20 MD/+/- Venofer appts due to being in Rehab. Pt stated that she would contact the office at a later date to R/S. All appts cx per pt request

## 2020-06-08 DIAGNOSIS — K1379 Other lesions of oral mucosa: Secondary | ICD-10-CM | POA: Diagnosis not present

## 2020-06-08 DIAGNOSIS — Z515 Encounter for palliative care: Secondary | ICD-10-CM | POA: Diagnosis not present

## 2020-06-08 DIAGNOSIS — I10 Essential (primary) hypertension: Secondary | ICD-10-CM | POA: Diagnosis not present

## 2020-06-12 ENCOUNTER — Inpatient Hospital Stay

## 2020-06-14 ENCOUNTER — Inpatient Hospital Stay: Admitting: Oncology

## 2020-06-14 ENCOUNTER — Inpatient Hospital Stay

## 2020-06-16 DIAGNOSIS — M6281 Muscle weakness (generalized): Secondary | ICD-10-CM | POA: Diagnosis not present

## 2020-06-16 DIAGNOSIS — R2681 Unsteadiness on feet: Secondary | ICD-10-CM | POA: Diagnosis not present

## 2020-06-19 DIAGNOSIS — M6281 Muscle weakness (generalized): Secondary | ICD-10-CM | POA: Diagnosis not present

## 2020-06-19 DIAGNOSIS — R2681 Unsteadiness on feet: Secondary | ICD-10-CM | POA: Diagnosis not present

## 2020-06-20 DIAGNOSIS — K117 Disturbances of salivary secretion: Secondary | ICD-10-CM | POA: Diagnosis not present

## 2020-06-20 DIAGNOSIS — I10 Essential (primary) hypertension: Secondary | ICD-10-CM | POA: Diagnosis not present

## 2020-06-20 DIAGNOSIS — R2681 Unsteadiness on feet: Secondary | ICD-10-CM | POA: Diagnosis not present

## 2020-06-20 DIAGNOSIS — M6281 Muscle weakness (generalized): Secondary | ICD-10-CM | POA: Diagnosis not present

## 2020-06-21 DIAGNOSIS — M6281 Muscle weakness (generalized): Secondary | ICD-10-CM | POA: Diagnosis not present

## 2020-06-21 DIAGNOSIS — R2681 Unsteadiness on feet: Secondary | ICD-10-CM | POA: Diagnosis not present

## 2020-06-22 DIAGNOSIS — M6281 Muscle weakness (generalized): Secondary | ICD-10-CM | POA: Diagnosis not present

## 2020-06-22 DIAGNOSIS — R2681 Unsteadiness on feet: Secondary | ICD-10-CM | POA: Diagnosis not present

## 2020-06-23 DIAGNOSIS — M6281 Muscle weakness (generalized): Secondary | ICD-10-CM | POA: Diagnosis not present

## 2020-06-23 DIAGNOSIS — R2681 Unsteadiness on feet: Secondary | ICD-10-CM | POA: Diagnosis not present

## 2020-06-26 DIAGNOSIS — M6281 Muscle weakness (generalized): Secondary | ICD-10-CM | POA: Diagnosis not present

## 2020-06-26 DIAGNOSIS — R2681 Unsteadiness on feet: Secondary | ICD-10-CM | POA: Diagnosis not present

## 2020-06-27 DIAGNOSIS — R2681 Unsteadiness on feet: Secondary | ICD-10-CM | POA: Diagnosis not present

## 2020-06-27 DIAGNOSIS — M6281 Muscle weakness (generalized): Secondary | ICD-10-CM | POA: Diagnosis not present

## 2020-06-28 DIAGNOSIS — M6281 Muscle weakness (generalized): Secondary | ICD-10-CM | POA: Diagnosis not present

## 2020-06-28 DIAGNOSIS — R2681 Unsteadiness on feet: Secondary | ICD-10-CM | POA: Diagnosis not present

## 2020-06-28 DIAGNOSIS — I480 Paroxysmal atrial fibrillation: Secondary | ICD-10-CM | POA: Diagnosis not present

## 2020-06-28 DIAGNOSIS — K2270 Barrett's esophagus without dysplasia: Secondary | ICD-10-CM | POA: Diagnosis not present

## 2020-06-28 DIAGNOSIS — J984 Other disorders of lung: Secondary | ICD-10-CM | POA: Diagnosis not present

## 2020-06-29 DIAGNOSIS — M6281 Muscle weakness (generalized): Secondary | ICD-10-CM | POA: Diagnosis not present

## 2020-06-29 DIAGNOSIS — R2681 Unsteadiness on feet: Secondary | ICD-10-CM | POA: Diagnosis not present

## 2020-06-30 DIAGNOSIS — J984 Other disorders of lung: Secondary | ICD-10-CM | POA: Diagnosis not present

## 2020-06-30 DIAGNOSIS — M6281 Muscle weakness (generalized): Secondary | ICD-10-CM | POA: Diagnosis not present

## 2020-06-30 DIAGNOSIS — R2681 Unsteadiness on feet: Secondary | ICD-10-CM | POA: Diagnosis not present

## 2020-06-30 DIAGNOSIS — R198 Other specified symptoms and signs involving the digestive system and abdomen: Secondary | ICD-10-CM | POA: Diagnosis not present

## 2020-07-01 DIAGNOSIS — R2681 Unsteadiness on feet: Secondary | ICD-10-CM | POA: Diagnosis not present

## 2020-07-01 DIAGNOSIS — M6281 Muscle weakness (generalized): Secondary | ICD-10-CM | POA: Diagnosis not present

## 2020-07-03 DIAGNOSIS — R2681 Unsteadiness on feet: Secondary | ICD-10-CM | POA: Diagnosis not present

## 2020-07-03 DIAGNOSIS — M6281 Muscle weakness (generalized): Secondary | ICD-10-CM | POA: Diagnosis not present

## 2020-07-04 DIAGNOSIS — M6281 Muscle weakness (generalized): Secondary | ICD-10-CM | POA: Diagnosis not present

## 2020-07-04 DIAGNOSIS — R2681 Unsteadiness on feet: Secondary | ICD-10-CM | POA: Diagnosis not present

## 2020-07-05 DIAGNOSIS — K5289 Other specified noninfective gastroenteritis and colitis: Secondary | ICD-10-CM | POA: Diagnosis not present

## 2020-07-05 DIAGNOSIS — M6281 Muscle weakness (generalized): Secondary | ICD-10-CM | POA: Diagnosis not present

## 2020-07-05 DIAGNOSIS — I10 Essential (primary) hypertension: Secondary | ICD-10-CM | POA: Diagnosis not present

## 2020-07-05 DIAGNOSIS — R2681 Unsteadiness on feet: Secondary | ICD-10-CM | POA: Diagnosis not present

## 2020-07-06 DIAGNOSIS — M6281 Muscle weakness (generalized): Secondary | ICD-10-CM | POA: Diagnosis not present

## 2020-07-06 DIAGNOSIS — R2681 Unsteadiness on feet: Secondary | ICD-10-CM | POA: Diagnosis not present

## 2020-07-07 DIAGNOSIS — K5289 Other specified noninfective gastroenteritis and colitis: Secondary | ICD-10-CM | POA: Diagnosis not present

## 2020-07-07 DIAGNOSIS — I480 Paroxysmal atrial fibrillation: Secondary | ICD-10-CM | POA: Diagnosis not present

## 2020-07-07 DIAGNOSIS — I10 Essential (primary) hypertension: Secondary | ICD-10-CM | POA: Diagnosis not present

## 2020-07-07 DIAGNOSIS — R0789 Other chest pain: Secondary | ICD-10-CM | POA: Diagnosis not present

## 2020-07-10 DIAGNOSIS — M6281 Muscle weakness (generalized): Secondary | ICD-10-CM | POA: Diagnosis not present

## 2020-07-10 DIAGNOSIS — Z03818 Encounter for observation for suspected exposure to other biological agents ruled out: Secondary | ICD-10-CM | POA: Diagnosis not present

## 2020-07-10 DIAGNOSIS — R2681 Unsteadiness on feet: Secondary | ICD-10-CM | POA: Diagnosis not present

## 2020-07-11 DIAGNOSIS — R251 Tremor, unspecified: Secondary | ICD-10-CM | POA: Diagnosis not present

## 2020-07-11 DIAGNOSIS — R008 Other abnormalities of heart beat: Secondary | ICD-10-CM | POA: Diagnosis not present

## 2020-07-11 DIAGNOSIS — M6281 Muscle weakness (generalized): Secondary | ICD-10-CM | POA: Diagnosis not present

## 2020-07-11 DIAGNOSIS — R2681 Unsteadiness on feet: Secondary | ICD-10-CM | POA: Diagnosis not present

## 2020-07-12 DIAGNOSIS — M6281 Muscle weakness (generalized): Secondary | ICD-10-CM | POA: Diagnosis not present

## 2020-07-12 DIAGNOSIS — R2681 Unsteadiness on feet: Secondary | ICD-10-CM | POA: Diagnosis not present

## 2020-07-13 DIAGNOSIS — R2681 Unsteadiness on feet: Secondary | ICD-10-CM | POA: Diagnosis not present

## 2020-07-13 DIAGNOSIS — M6281 Muscle weakness (generalized): Secondary | ICD-10-CM | POA: Diagnosis not present

## 2020-07-14 DIAGNOSIS — M6281 Muscle weakness (generalized): Secondary | ICD-10-CM | POA: Diagnosis not present

## 2020-07-14 DIAGNOSIS — K529 Noninfective gastroenteritis and colitis, unspecified: Secondary | ICD-10-CM | POA: Diagnosis not present

## 2020-07-14 DIAGNOSIS — J984 Other disorders of lung: Secondary | ICD-10-CM | POA: Diagnosis not present

## 2020-07-14 DIAGNOSIS — R2681 Unsteadiness on feet: Secondary | ICD-10-CM | POA: Diagnosis not present

## 2020-07-14 DIAGNOSIS — K648 Other hemorrhoids: Secondary | ICD-10-CM | POA: Diagnosis not present

## 2020-07-16 DIAGNOSIS — R2681 Unsteadiness on feet: Secondary | ICD-10-CM | POA: Diagnosis not present

## 2020-07-16 DIAGNOSIS — M6281 Muscle weakness (generalized): Secondary | ICD-10-CM | POA: Diagnosis not present

## 2020-07-17 DIAGNOSIS — M6281 Muscle weakness (generalized): Secondary | ICD-10-CM | POA: Diagnosis not present

## 2020-07-17 DIAGNOSIS — Z03818 Encounter for observation for suspected exposure to other biological agents ruled out: Secondary | ICD-10-CM | POA: Diagnosis not present

## 2020-07-17 DIAGNOSIS — R2681 Unsteadiness on feet: Secondary | ICD-10-CM | POA: Diagnosis not present

## 2020-07-18 DIAGNOSIS — R2681 Unsteadiness on feet: Secondary | ICD-10-CM | POA: Diagnosis not present

## 2020-07-18 DIAGNOSIS — M6281 Muscle weakness (generalized): Secondary | ICD-10-CM | POA: Diagnosis not present

## 2020-07-19 DIAGNOSIS — R2681 Unsteadiness on feet: Secondary | ICD-10-CM | POA: Diagnosis not present

## 2020-07-19 DIAGNOSIS — M6281 Muscle weakness (generalized): Secondary | ICD-10-CM | POA: Diagnosis not present

## 2020-07-20 ENCOUNTER — Telehealth: Payer: Self-pay

## 2020-07-20 ENCOUNTER — Ambulatory Visit: Payer: Medicare Other

## 2020-07-20 DIAGNOSIS — R2681 Unsteadiness on feet: Secondary | ICD-10-CM | POA: Diagnosis not present

## 2020-07-20 DIAGNOSIS — M6281 Muscle weakness (generalized): Secondary | ICD-10-CM | POA: Diagnosis not present

## 2020-07-20 NOTE — Telephone Encounter (Signed)
No answer on phone when called for scheduled awv. Unable to leave a message. No voicemail. Please reschedule as appropriate.

## 2020-07-21 DIAGNOSIS — R2681 Unsteadiness on feet: Secondary | ICD-10-CM | POA: Diagnosis not present

## 2020-07-21 DIAGNOSIS — M6281 Muscle weakness (generalized): Secondary | ICD-10-CM | POA: Diagnosis not present

## 2020-07-24 DIAGNOSIS — M6281 Muscle weakness (generalized): Secondary | ICD-10-CM | POA: Diagnosis not present

## 2020-07-24 DIAGNOSIS — R2681 Unsteadiness on feet: Secondary | ICD-10-CM | POA: Diagnosis not present

## 2020-07-24 DIAGNOSIS — Z03818 Encounter for observation for suspected exposure to other biological agents ruled out: Secondary | ICD-10-CM | POA: Diagnosis not present

## 2020-07-25 DIAGNOSIS — M6281 Muscle weakness (generalized): Secondary | ICD-10-CM | POA: Diagnosis not present

## 2020-07-25 DIAGNOSIS — R2681 Unsteadiness on feet: Secondary | ICD-10-CM | POA: Diagnosis not present

## 2020-07-26 ENCOUNTER — Other Ambulatory Visit (INDEPENDENT_AMBULATORY_CARE_PROVIDER_SITE_OTHER): Payer: Medicare Other

## 2020-07-26 ENCOUNTER — Ambulatory Visit (INDEPENDENT_AMBULATORY_CARE_PROVIDER_SITE_OTHER): Payer: Medicare Other | Admitting: Nurse Practitioner

## 2020-07-26 DIAGNOSIS — M6281 Muscle weakness (generalized): Secondary | ICD-10-CM | POA: Diagnosis not present

## 2020-07-26 DIAGNOSIS — R2681 Unsteadiness on feet: Secondary | ICD-10-CM | POA: Diagnosis not present

## 2020-07-27 DIAGNOSIS — R2681 Unsteadiness on feet: Secondary | ICD-10-CM | POA: Diagnosis not present

## 2020-07-27 DIAGNOSIS — M6281 Muscle weakness (generalized): Secondary | ICD-10-CM | POA: Diagnosis not present

## 2020-07-28 DIAGNOSIS — M6281 Muscle weakness (generalized): Secondary | ICD-10-CM | POA: Diagnosis not present

## 2020-07-28 DIAGNOSIS — I10 Essential (primary) hypertension: Secondary | ICD-10-CM | POA: Diagnosis not present

## 2020-07-28 DIAGNOSIS — A4189 Other specified sepsis: Secondary | ICD-10-CM | POA: Diagnosis not present

## 2020-07-28 DIAGNOSIS — R2681 Unsteadiness on feet: Secondary | ICD-10-CM | POA: Diagnosis not present

## 2020-07-29 DIAGNOSIS — M6281 Muscle weakness (generalized): Secondary | ICD-10-CM | POA: Diagnosis not present

## 2020-07-29 DIAGNOSIS — R2681 Unsteadiness on feet: Secondary | ICD-10-CM | POA: Diagnosis not present

## 2020-10-02 ENCOUNTER — Telehealth: Payer: Self-pay | Admitting: Family

## 2020-10-02 NOTE — Telephone Encounter (Signed)
NO ANSWER/NO VM: PHONE DID NOT EVEN RING. Pt due to schedule Medicare Annual Wellness Visit (AWV)   This should be a telephone visit only=30 minutes.  Last AWV 07/20/19; please schedule at anytime with Brandy Armstrong at [facility].

## 2020-10-30 DEATH — deceased

## 2021-02-19 ENCOUNTER — Telehealth: Payer: Self-pay | Admitting: Cardiovascular Disease

## 2021-02-19 NOTE — Telephone Encounter (Signed)
3 attempts to schedule fu appt from recall list.   Deleting recall.   

## 2022-10-28 ENCOUNTER — Encounter (INDEPENDENT_AMBULATORY_CARE_PROVIDER_SITE_OTHER): Payer: Self-pay
